# Patient Record
Sex: Male | Born: 1955 | Hispanic: Yes | Marital: Married | Smoking: Former smoker
Health system: Southern US, Community
[De-identification: ages and names within clinical notes are randomized; demographics above are authoritative.]

## PROBLEM LIST (undated history)

## (undated) DIAGNOSIS — E1143 Type 2 diabetes mellitus with diabetic autonomic (poly)neuropathy: Secondary | ICD-10-CM

## (undated) DIAGNOSIS — K3184 Gastroparesis: Secondary | ICD-10-CM

## (undated) DIAGNOSIS — R079 Chest pain, unspecified: Secondary | ICD-10-CM

## (undated) DIAGNOSIS — I219 Acute myocardial infarction, unspecified: Secondary | ICD-10-CM

## (undated) DIAGNOSIS — I10 Essential (primary) hypertension: Secondary | ICD-10-CM

## (undated) DIAGNOSIS — E119 Type 2 diabetes mellitus without complications: Secondary | ICD-10-CM

## (undated) DIAGNOSIS — H4901 Third [oculomotor] nerve palsy, right eye: Secondary | ICD-10-CM

## (undated) DIAGNOSIS — I714 Abdominal aortic aneurysm, without rupture, unspecified: Secondary | ICD-10-CM

## (undated) DIAGNOSIS — N189 Chronic kidney disease, unspecified: Secondary | ICD-10-CM

## (undated) DIAGNOSIS — R809 Proteinuria, unspecified: Secondary | ICD-10-CM

## (undated) DIAGNOSIS — E1165 Type 2 diabetes mellitus with hyperglycemia: Secondary | ICD-10-CM

## (undated) HISTORY — PX: NECK SURGERY: SHX720

## (undated) HISTORY — DX: Type 2 diabetes mellitus with hyperglycemia: E11.65

## (undated) HISTORY — DX: Abdominal aortic aneurysm, without rupture, unspecified: I71.40

## (undated) HISTORY — DX: Abdominal aortic aneurysm, without rupture: I71.4

## (undated) HISTORY — PX: COLONOSCOPY: SHX174

## (undated) HISTORY — PX: EYE SURGERY: SHX253

## (undated) HISTORY — DX: Third (oculomotor) nerve palsy, right eye: H49.01

## (undated) HISTORY — DX: Chronic kidney disease, unspecified: N18.9

## (undated) HISTORY — DX: Chest pain, unspecified: R07.9

## (undated) HISTORY — DX: Gastroparesis: K31.84

## (undated) HISTORY — DX: Acute myocardial infarction, unspecified: I21.9

## (undated) HISTORY — DX: Proteinuria, unspecified: R80.9

## (undated) HISTORY — DX: Type 2 diabetes mellitus with diabetic autonomic (poly)neuropathy: E11.43

---
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
## 2000-10-19 ENCOUNTER — Encounter: Payer: Self-pay | Admitting: Emergency Medicine

## 2000-10-19 ENCOUNTER — Emergency Department (HOSPITAL_COMMUNITY): Admission: EM | Admit: 2000-10-19 | Discharge: 2000-10-19 | Payer: Self-pay | Admitting: Emergency Medicine

## 2002-03-01 ENCOUNTER — Emergency Department (HOSPITAL_COMMUNITY): Admission: EM | Admit: 2002-03-01 | Discharge: 2002-03-02 | Payer: Self-pay | Admitting: Emergency Medicine

## 2006-07-06 ENCOUNTER — Emergency Department (HOSPITAL_COMMUNITY): Admission: EM | Admit: 2006-07-06 | Discharge: 2006-07-06 | Payer: Self-pay | Admitting: Emergency Medicine

## 2009-01-10 ENCOUNTER — Emergency Department (HOSPITAL_COMMUNITY): Admission: EM | Admit: 2009-01-10 | Discharge: 2009-01-11 | Payer: Self-pay | Admitting: Emergency Medicine

## 2010-03-29 ENCOUNTER — Emergency Department (HOSPITAL_COMMUNITY): Admission: EM | Admit: 2010-03-29 | Discharge: 2010-03-30 | Payer: Self-pay | Admitting: Emergency Medicine

## 2010-10-25 LAB — URINE CULTURE
Colony Count: NO GROWTH
Culture  Setup Time: 201108190210
Culture: NO GROWTH

## 2010-10-25 LAB — URINALYSIS, ROUTINE W REFLEX MICROSCOPIC
Bilirubin Urine: NEGATIVE
Glucose, UA: >1000 mg/dL — AB
Hgb urine dipstick: NEGATIVE
Ketones, ur: NEGATIVE mg/dL
Leukocytes, UA: NEGATIVE
Nitrite: NEGATIVE
Protein, ur: 100 mg/dL — AB
Specific Gravity, Urine: 1.031 — ABNORMAL HIGH (ref 1.005–1.030)
Urobilinogen, UA: 1 mg/dL (ref 0.0–1.0)
pH: 5.5 (ref 5.0–8.0)

## 2010-10-25 LAB — GLUCOSE, CAPILLARY
Glucose-Capillary: 103 mg/dL — ABNORMAL HIGH (ref 70–99)
Glucose-Capillary: 107 mg/dL — ABNORMAL HIGH (ref 70–99)
Glucose-Capillary: 245 mg/dL — ABNORMAL HIGH (ref 70–99)
Glucose-Capillary: 76 mg/dL (ref 70–99)
Glucose-Capillary: 97 mg/dL (ref 70–99)

## 2010-10-25 LAB — DIFFERENTIAL
Basophils Absolute: 0 10*3/uL (ref 0.0–0.1)
Basophils Relative: 1 % (ref 0–1)
Eosinophils Absolute: 0.1 10*3/uL (ref 0.0–0.7)
Eosinophils Relative: 1 % (ref 0–5)
Lymphocytes Relative: 43 % (ref 12–46)
Lymphs Abs: 3.4 10*3/uL (ref 0.7–4.0)
Monocytes Absolute: 0.6 10*3/uL (ref 0.1–1.0)
Monocytes Relative: 7 % (ref 3–12)
Neutro Abs: 3.8 10*3/uL (ref 1.7–7.7)
Neutrophils Relative %: 49 % (ref 43–77)

## 2010-10-25 LAB — POCT I-STAT, CHEM 8
BUN: 16 mg/dL (ref 6–23)
Calcium, Ion: 1.2 mmol/L (ref 1.12–1.32)
Chloride: 103 mEq/L (ref 96–112)
Creatinine, Ser: 0.7 mg/dL (ref 0.4–1.5)
Glucose, Bld: 134 mg/dL — ABNORMAL HIGH (ref 70–99)
HCT: 41 % (ref 39.0–52.0)
Hemoglobin: 13.9 g/dL (ref 13.0–17.0)
Potassium: 3.2 mEq/L — ABNORMAL LOW (ref 3.5–5.1)
Sodium: 139 mEq/L (ref 135–145)
TCO2: 25 mmol/L (ref 0–100)

## 2010-10-25 LAB — CBC
HCT: 40.2 % (ref 39.0–52.0)
Hemoglobin: 14.3 g/dL (ref 13.0–17.0)
MCH: 31.9 pg (ref 26.0–34.0)
MCHC: 35.6 g/dL (ref 30.0–36.0)
MCV: 89.5 fL (ref 78.0–100.0)
Platelets: 160 10*3/uL (ref 150–400)
RBC: 4.49 MIL/uL (ref 4.22–5.81)
RDW: 12.1 % (ref 11.5–15.5)
WBC: 7.9 10*3/uL (ref 4.0–10.5)

## 2010-10-25 LAB — URINE MICROSCOPIC-ADD ON

## 2010-11-19 LAB — POCT CARDIAC MARKERS
CKMB, poc: 1.1 ng/mL (ref 1.0–8.0)
CKMB, poc: <1 ng/mL — ABNORMAL LOW (ref 1.0–8.0)
Myoglobin, poc: 62.3 ng/mL (ref 12–200)
Myoglobin, poc: 81.6 ng/mL (ref 12–200)
Troponin i, poc: <0.05 ng/mL (ref 0.00–0.09)
Troponin i, poc: <0.05 ng/mL (ref 0.00–0.09)

## 2010-11-19 LAB — POCT I-STAT, CHEM 8
BUN: 15 mg/dL (ref 6–23)
Calcium, Ion: 1.13 mmol/L (ref 1.12–1.32)
Chloride: 105 mEq/L (ref 96–112)
Creatinine, Ser: 0.9 mg/dL (ref 0.4–1.5)
Glucose, Bld: 249 mg/dL — ABNORMAL HIGH (ref 70–99)
HCT: 43 % (ref 39.0–52.0)
Hemoglobin: 14.6 g/dL (ref 13.0–17.0)
Potassium: 4.2 mEq/L (ref 3.5–5.1)
Sodium: 139 mEq/L (ref 135–145)
TCO2: 26 mmol/L (ref 0–100)

## 2010-11-19 LAB — DIFFERENTIAL
Basophils Absolute: 0 10*3/uL (ref 0.0–0.1)
Basophils Relative: 1 % (ref 0–1)
Eosinophils Absolute: 0.2 10*3/uL (ref 0.0–0.7)
Eosinophils Relative: 2 % (ref 0–5)
Lymphocytes Relative: 34 % (ref 12–46)
Lymphs Abs: 2.5 10*3/uL (ref 0.7–4.0)
Monocytes Absolute: 0.6 10*3/uL (ref 0.1–1.0)
Monocytes Relative: 8 % (ref 3–12)
Neutro Abs: 4 10*3/uL (ref 1.7–7.7)
Neutrophils Relative %: 55 % (ref 43–77)

## 2010-11-19 LAB — CBC
HCT: 42.5 % (ref 39.0–52.0)
Hemoglobin: 14.4 g/dL (ref 13.0–17.0)
MCHC: 33.9 g/dL (ref 30.0–36.0)
MCV: 89.3 fL (ref 78.0–100.0)
Platelets: 159 10*3/uL (ref 150–400)
RBC: 4.76 MIL/uL (ref 4.22–5.81)
RDW: 12.4 % (ref 11.5–15.5)
WBC: 7.2 10*3/uL (ref 4.0–10.5)

## 2010-11-19 LAB — GLUCOSE, CAPILLARY: Glucose-Capillary: 225 mg/dL — ABNORMAL HIGH (ref 70–99)

## 2010-11-19 LAB — D-DIMER, QUANTITATIVE: D-Dimer, Quant: <0.22 ug/mL-FEU (ref 0.00–0.48)

## 2011-08-05 ENCOUNTER — Ambulatory Visit: Payer: Managed Care, Other (non HMO)

## 2011-08-05 DIAGNOSIS — E860 Dehydration: Secondary | ICD-10-CM

## 2011-08-05 DIAGNOSIS — E1065 Type 1 diabetes mellitus with hyperglycemia: Secondary | ICD-10-CM

## 2011-08-09 ENCOUNTER — Ambulatory Visit (INDEPENDENT_AMBULATORY_CARE_PROVIDER_SITE_OTHER): Payer: Managed Care, Other (non HMO)

## 2011-08-09 DIAGNOSIS — E119 Type 2 diabetes mellitus without complications: Secondary | ICD-10-CM

## 2011-11-25 ENCOUNTER — Ambulatory Visit: Payer: Managed Care, Other (non HMO) | Admitting: Internal Medicine

## 2011-11-25 VITALS — BP 133/82 | HR 70 | Temp 98.0°F | Resp 18 | Ht 65.0 in | Wt 161.0 lb

## 2011-11-25 DIAGNOSIS — IMO0002 Reserved for concepts with insufficient information to code with codable children: Secondary | ICD-10-CM

## 2011-11-25 DIAGNOSIS — E1165 Type 2 diabetes mellitus with hyperglycemia: Secondary | ICD-10-CM | POA: Insufficient documentation

## 2011-11-25 DIAGNOSIS — IMO0001 Reserved for inherently not codable concepts without codable children: Secondary | ICD-10-CM

## 2011-11-25 HISTORY — DX: Reserved for concepts with insufficient information to code with codable children: IMO0002

## 2011-11-25 HISTORY — DX: Type 2 diabetes mellitus with hyperglycemia: E11.65

## 2011-11-25 LAB — POCT URINALYSIS DIPSTICK
Bilirubin, UA: NEGATIVE
Blood, UA: NEGATIVE
Glucose, UA: 1000
Ketones, UA: NEGATIVE
Leukocytes, UA: NEGATIVE
Nitrite, UA: NEGATIVE
Protein, UA: 100
Spec Grav, UA: 1.01
Urobilinogen, UA: 0.2
pH, UA: 6.5

## 2011-11-25 LAB — POCT CBC
Granulocyte percent: 62.5 %G (ref 37–80)
HCT, POC: 44.8 % (ref 43.5–53.7)
Hemoglobin: 15.1 g/dL (ref 14.1–18.1)
Lymph, poc: 2.1 (ref 0.6–3.4)
MCH, POC: 29.6 pg (ref 27–31.2)
MCHC: 33.7 g/dL (ref 31.8–35.4)
MCV: 87.9 fL (ref 80–97)
MID (cbc): 0.4 (ref 0–0.9)
MPV: 13.7 fL (ref 0–99.8)
POC Granulocyte: 4.2 (ref 2–6.9)
POC LYMPH PERCENT: 31.6 %L (ref 10–50)
POC MID %: 5.9 %M (ref 0–12)
Platelet Count, POC: 163 10*3/uL (ref 142–424)
RBC: 5.1 M/uL (ref 4.69–6.13)
RDW, POC: 12.8 %
WBC: 6.7 10*3/uL (ref 4.6–10.2)

## 2011-11-25 LAB — POCT UA - MICROSCOPIC ONLY
Bacteria, U Microscopic: NEGATIVE
Casts, Ur, LPF, POC: NEGATIVE
Crystals, Ur, HPF, POC: NEGATIVE
Epithelial cells, urine per micros: NEGATIVE
RBC, urine, microscopic: NEGATIVE
Yeast, UA: NEGATIVE

## 2011-11-25 LAB — POCT GLYCOSYLATED HEMOGLOBIN (HGB A1C): Hemoglobin A1C: 13.4

## 2011-11-25 LAB — GLUCOSE, POCT (MANUAL RESULT ENTRY)

## 2011-11-25 MED ORDER — INSULIN GLARGINE 100 UNIT/ML ~~LOC~~ SOLN: 10.0000 [IU] | Freq: Every day | SUBCUTANEOUS | Status: DC

## 2011-11-25 MED ORDER — INSULIN NPH (HUMAN) (ISOPHANE) 100 UNIT/ML ~~LOC~~ SUSP
10.0000 [IU] | Freq: Once | SUBCUTANEOUS | Status: AC
  Administered 2011-11-25: 10 [IU] via SUBCUTANEOUS

## 2011-11-25 NOTE — Patient Instructions (Signed)
TAKE 10 UNITS OF LANTUS INSULIN AT BEDTIME DAILY.  CHECK YOUR BLOOD SUGAR 3 TIMES A WEEK AND RECORD, BRING WITH YOU TO THE NEXT OFFICE VISIT.  Diabetes tipo 2 (Diabetes, Type 2) La diabetes es una enfermedad crnica. En la diabetes tipo 2, el pncreas no fabrica la cantidad suficiente de insulina (una hormona) y el organismo no responde normalmente a la insulina que produce. Este tipo de diabetes antes se llamaba diabetes del [place]. Generalmente aparece despus de los 43 aos, pero puede suceder a Hotel manager. CAUSAS La diabetes tipo 2 aparece cuando el pncreas no produce la cantidad suficiente de insulina o su organismo tiene dificultad para usar la insulina que el pncreas produce adecuadamente.  SNTOMAS  Beber ms que lo habitual.   Orinar ms que lo habitual.   Visin borrosa   Piel seca y que pica.   Infecciones frecuentes.   Sentir ms cansancio que lo habitual (fatiga).  DIAGNSTICO  El diagnstico de diabetes tipo 2 se hace a travs de las siguientes pruebas:   Prueba de glucosa en sangre en ayunas. Usted no debe comer durante al menos 8 horas y [place] se hace el anlisis de [place].   Pruebas al azar de glucosa en sangre. El nivel de glucosa en sangre (azcar)se controla en cualquier momento del da sin importar el momento en que haya comido.   Prueba oral de tolerancia a la glucosa. La glucosa en sangre se mide despus de no haber comido ayunado) y despus de haber bebido una preparacin que contenga glucosa.  TRATAMIENTO  Consuma una dieta saludable.   La prctica de ejercicios.   Si es necesario, Biochemist, clinical.   Controlar el nivel de glucosa en sangre (azcar).   Concurra regularmente a la consulta con el [facility] su nivel de glucosa en sangre (azcar) al menos una vez al da. Puede ser necesario que realice controles ms frecuentes, segn los medicamentos que toma y el xito en el  control de la diabetes. El profesional lo ayudar.   Tome la [employer] le ha indicado el profesional que lo asiste.   No fume.   Elija cuidadosamente los alimentos. Pida informacin a su mdico. La prdida de peso puede mejorar la diabetes.   Investigue acerca del nivel bajo de azcar en sangre (hipoglucemia) y aprenda cmo tratarlo.   Hgase un examen de la vista con regularidad.   Concurra para un examen fsico una vez por ao. Controle su presin arterial. Haga anlisis de sangre y [place].   Use un colgante o una pulsera que indique que es diabtico.   Controle sus pies todas las noches para observar si hay cortes, llagas, ampollas o enrojecimiento. Hable con el profesional que lo asiste si tiene algn problema.  SOLICITE ATENCIN MDICA SI:  Tiene problemas para Advertising account executive de glucosa en el rango indicado.   Siente efectos adversos por los medicamentos prescriptos.   Tiene sntomas de enfermedad que no mejoran en 24 horas.   Tiene una llaga o herida que no se cura.   Nota cambios o un nuevo problema en la visin.   Tiene fiebre.  ASEGRESE DE QUE:   Comprende estas instrucciones.   Controlar su enfermedad.   Solicitar ayuda de inmediato si no mejora o si empeora.  Document Released: 07/29/2005 Document Revised: 07/18/2011 [facility] Patient Information 2012 [place].

## 2011-11-25 NOTE — Progress Notes (Signed)
  Subjective:    Patient ID: Ryan Wells, male    DOB: 06-17-1956, 56 y.o.   MRN: [id number]  HPI  Ruairi is here with an interpreter, he speaks no [place].  He works 6 pm-6am.  He stopped his Metformin due to vomiting and does not wish to restart this.  He is currently out of his humalog but was only taking it once a day.  He was recently hit for a piece of machinery at work and hurt his right side, he was on pain medicine for awhile.  His interpreter tells me he was on Lantus before that he got from a doctor in [facility].  History is very difficult to ascertain as patient does not understand or answer questions even when interpreter speaks with him.    Review of Systems  All other systems reviewed and are negative.  He denies any chest pain, SOB, he denies any parathesias.     Objective:   Physical Exam  Vitals reviewed. Constitutional: He is oriented to person, place, and time. He appears well-developed and well-nourished.  HENT:  Head: Normocephalic and atraumatic.  Right Ear: External ear normal.  Eyes: Conjunctivae are normal.  Neck: Neck supple.  Cardiovascular: Normal rate, regular rhythm and normal heart sounds.   Pulmonary/Chest: Effort normal and breath sounds normal.  Abdominal: Soft.  Musculoskeletal: He exhibits no edema and no tenderness.  Neurological: He is alert and oriented to person, place, and time.  Skin: Skin is warm and dry.  Psychiatric: He has a normal mood and affect. His behavior is normal.          Assessment & Plan:  Glucose [facility] at 8 pm tonight.  Given 10U of Humalog SQ.  CMP, Lipids pending.  He is given a copy of his last lab work to emphasize the need for better sugar control.  Recheck 2-3 weeks.  Start Lantus 10U at bedtime daily.  Will hold metformin and rapid acting insulin.  Record blood sugars 3X week and bring with him next OV.  Pt agrees, AVS printed and given.

## 2011-11-26 LAB — COMPREHENSIVE METABOLIC PANEL
ALT: 25 U/L (ref 0–53)
AST: 20 U/L (ref 0–37)
Albumin: 4.2 g/dL (ref 3.5–5.2)
Alkaline Phosphatase: 174 U/L — ABNORMAL HIGH (ref 39–117)
BUN: 19 mg/dL (ref 6–23)
CO2: 26 mEq/L (ref 19–32)
Calcium: 9.1 mg/dL (ref 8.4–10.5)
Chloride: 100 mEq/L (ref 96–112)
Creat: 1.09 mg/dL (ref 0.50–1.35)
Glucose, Bld: 472 mg/dL — ABNORMAL HIGH (ref 70–99)
Potassium: 4.3 mEq/L (ref 3.5–5.3)
Sodium: 137 mEq/L (ref 135–145)
Total Bilirubin: 0.6 mg/dL (ref 0.3–1.2)
Total Protein: 7.1 g/dL (ref 6.0–8.3)

## 2011-11-26 LAB — LIPID PANEL
Cholesterol: 201 mg/dL — ABNORMAL HIGH (ref 0–200)
HDL: 35 mg/dL — ABNORMAL LOW (ref >39)
LDL Cholesterol: 105 mg/dL — ABNORMAL HIGH (ref 0–99)
Total CHOL/HDL Ratio: 5.7 Ratio
Triglycerides: 305 mg/dL — ABNORMAL HIGH (ref <150)
VLDL: 61 mg/dL — ABNORMAL HIGH (ref 0–40)

## 2012-03-26 ENCOUNTER — Telehealth: Payer: Self-pay

## 2012-03-26 NOTE — Telephone Encounter (Signed)
[facility], BUT THEY NEED THE PEN NEEDLES TO [place] WITH IT PLEASE CALL [place] AT [phone]

## 2012-07-30 ENCOUNTER — Ambulatory Visit: Payer: Self-pay

## 2012-07-30 ENCOUNTER — Other Ambulatory Visit: Payer: Self-pay | Admitting: Occupational Medicine

## 2012-07-30 DIAGNOSIS — M549 Dorsalgia, unspecified: Secondary | ICD-10-CM

## 2012-07-30 DIAGNOSIS — M542 Cervicalgia: Secondary | ICD-10-CM

## 2012-07-30 IMAGING — CR DG THORACIC SPINE 2V
2 series · 2 of 2 positions shown · non-contrast
Comparison: None

CLINICAL DATA: Back pain.

THORACIC SPINE - 2 VIEW

[view not recorded (1 of 2)]
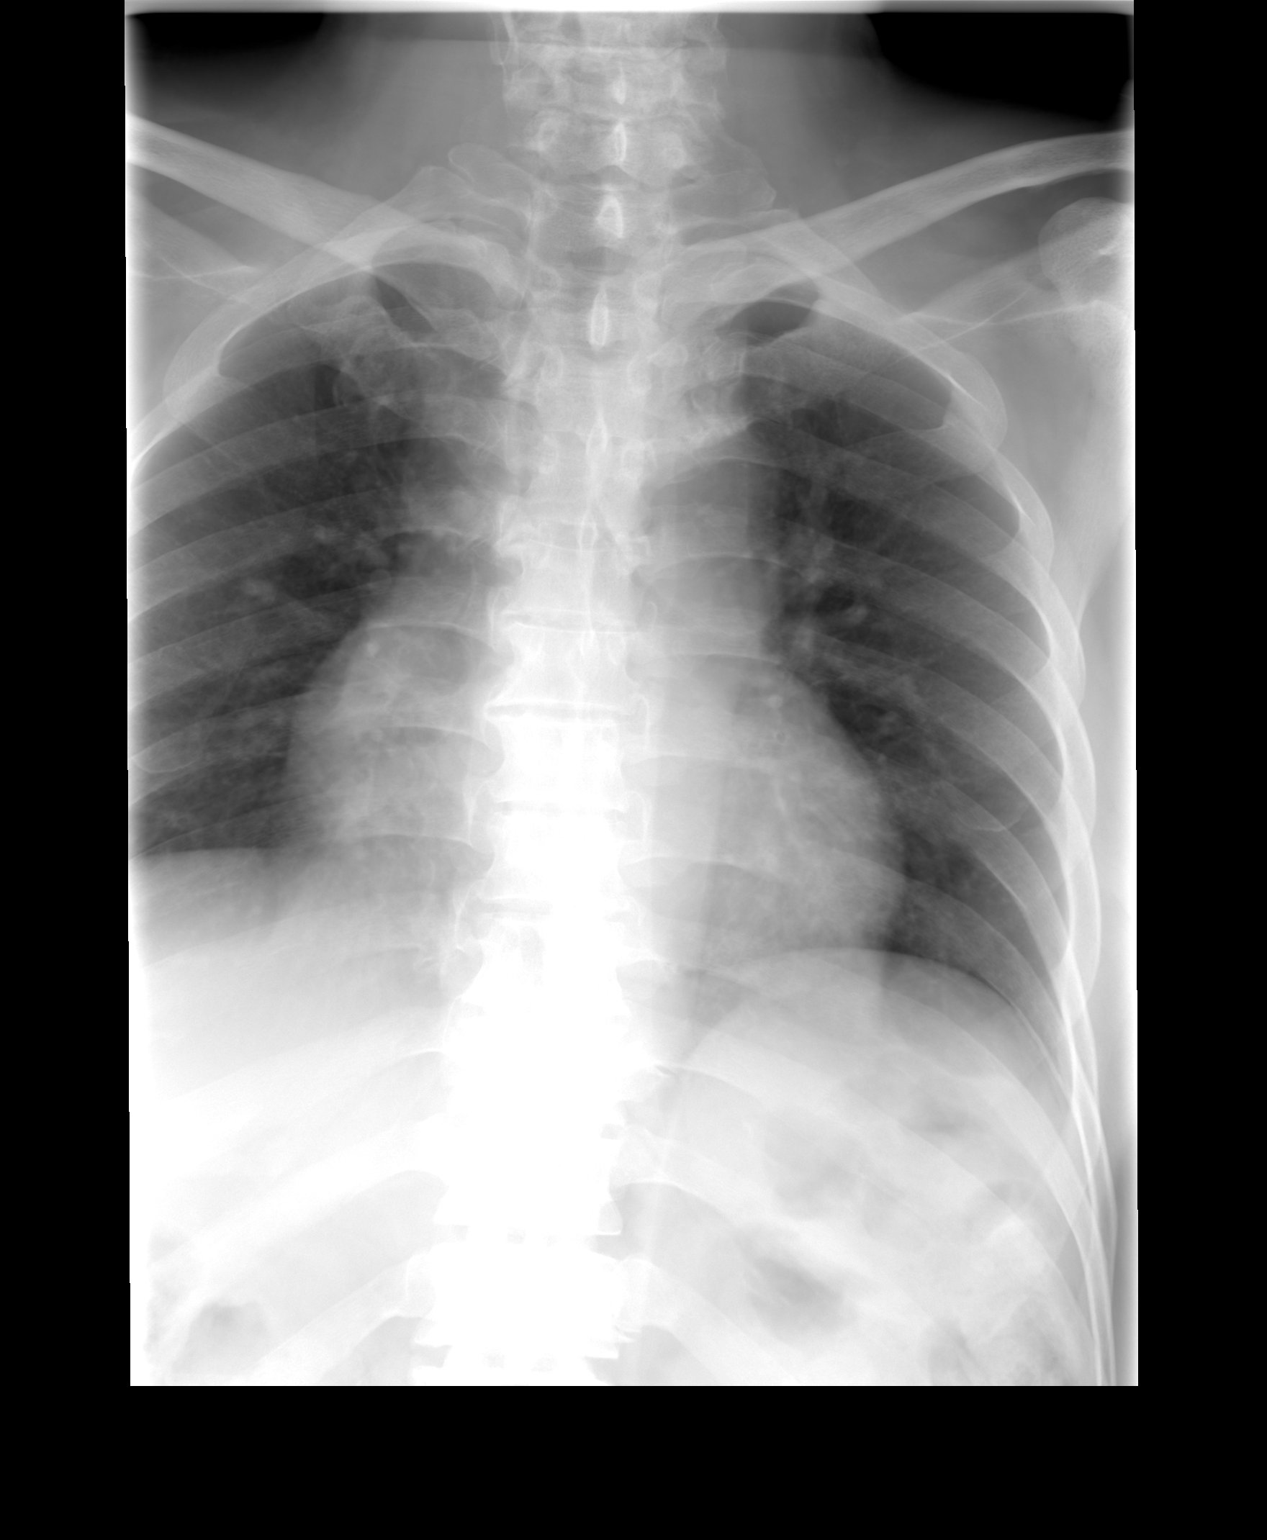

[view not recorded (2 of 2)]
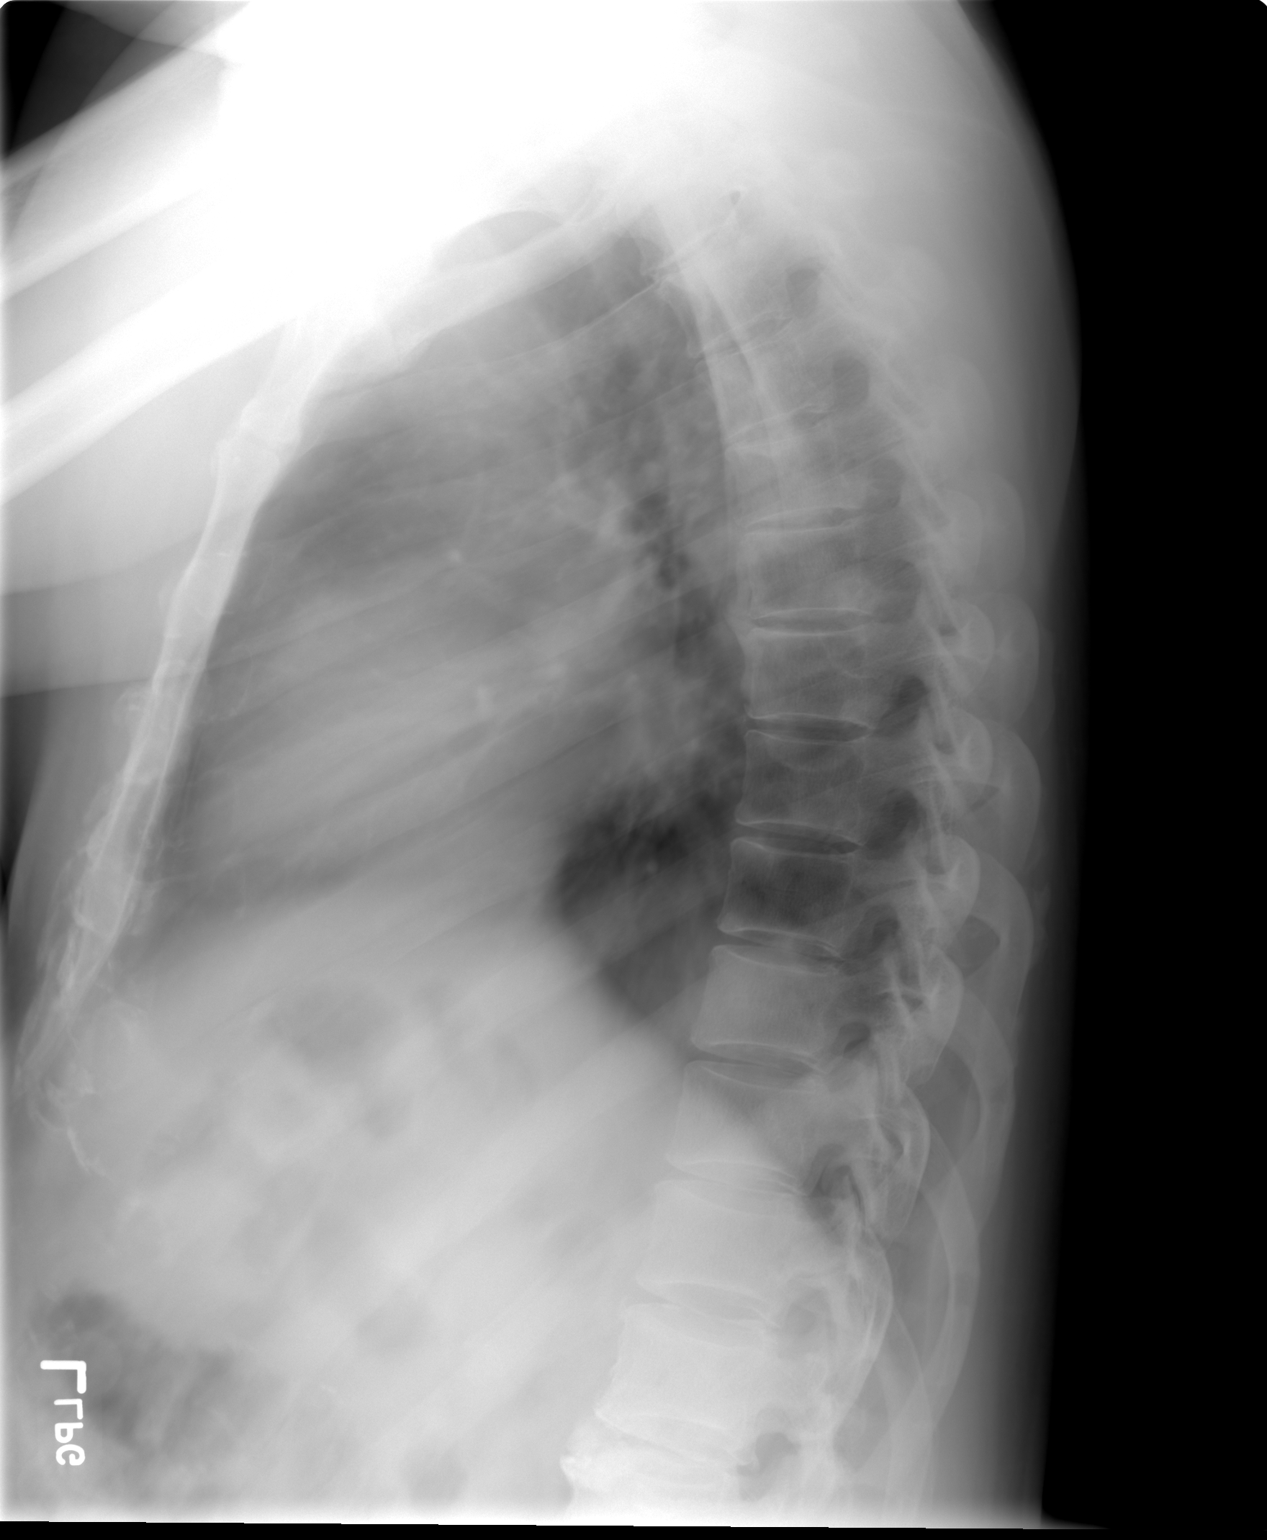

[2 of 2 positions shown; findings below may reference images not displayed]

FINDINGS: The lateral film demonstrates normal alignment of the
thoracic vertebral bodies.  Disc spaces and vertebral bodies are
maintained.  No acute bony findings, destructive bony changes or
abnormal paraspinal soft tissue swelling.  The visualized posterior
ribs appear normal.
IMPRESSION: Normal alignment and no acute bony findings.

## 2012-07-30 IMAGING — CR DG LUMBAR SPINE COMPLETE 4+V
5 series · 5 of 5 positions shown · non-contrast
Comparison: None

CLINICAL DATA: Back pain.  Recent injury.

LUMBAR SPINE - COMPLETE 4+ VIEW

[view not recorded (1 of 5)]
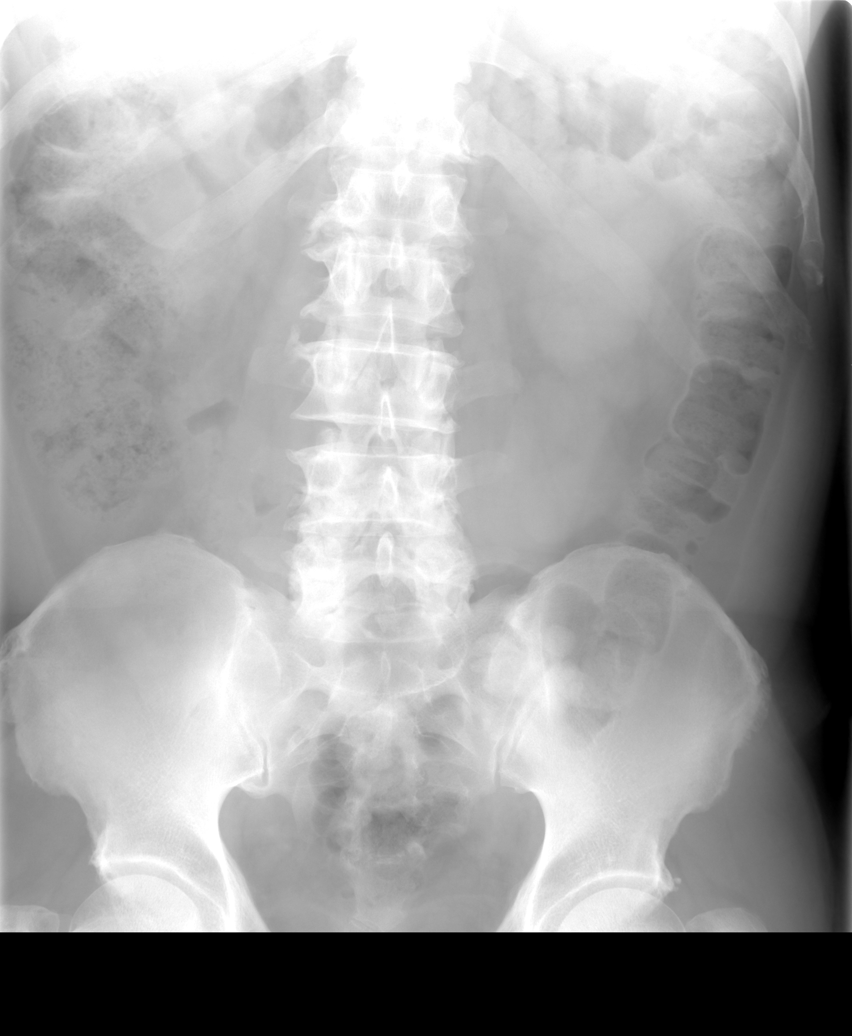

[view not recorded (2 of 5)]
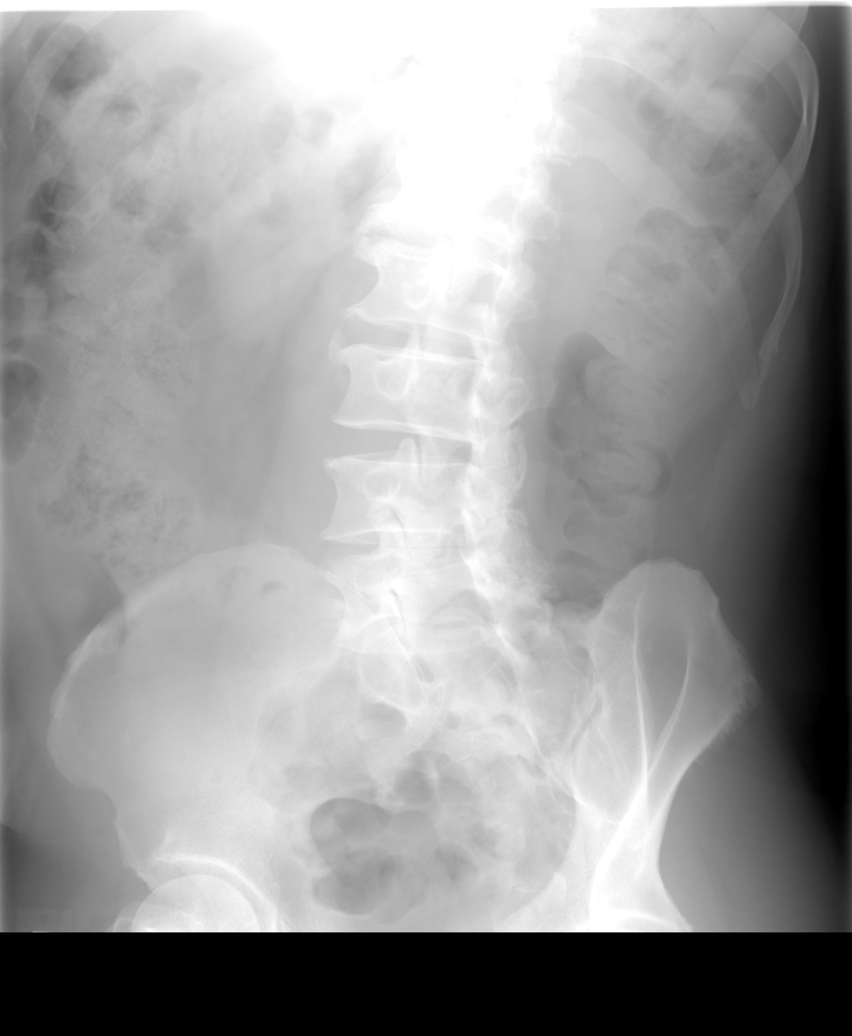

[view not recorded (3 of 5)]
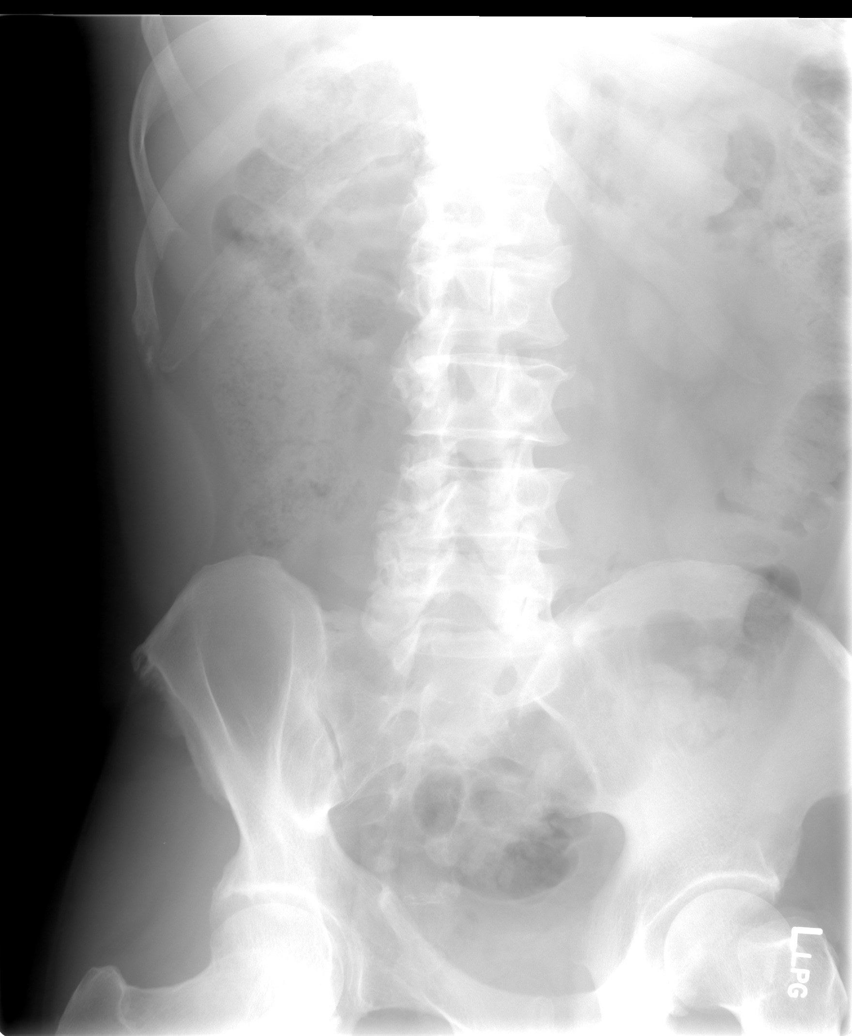

[view not recorded (4 of 5)]
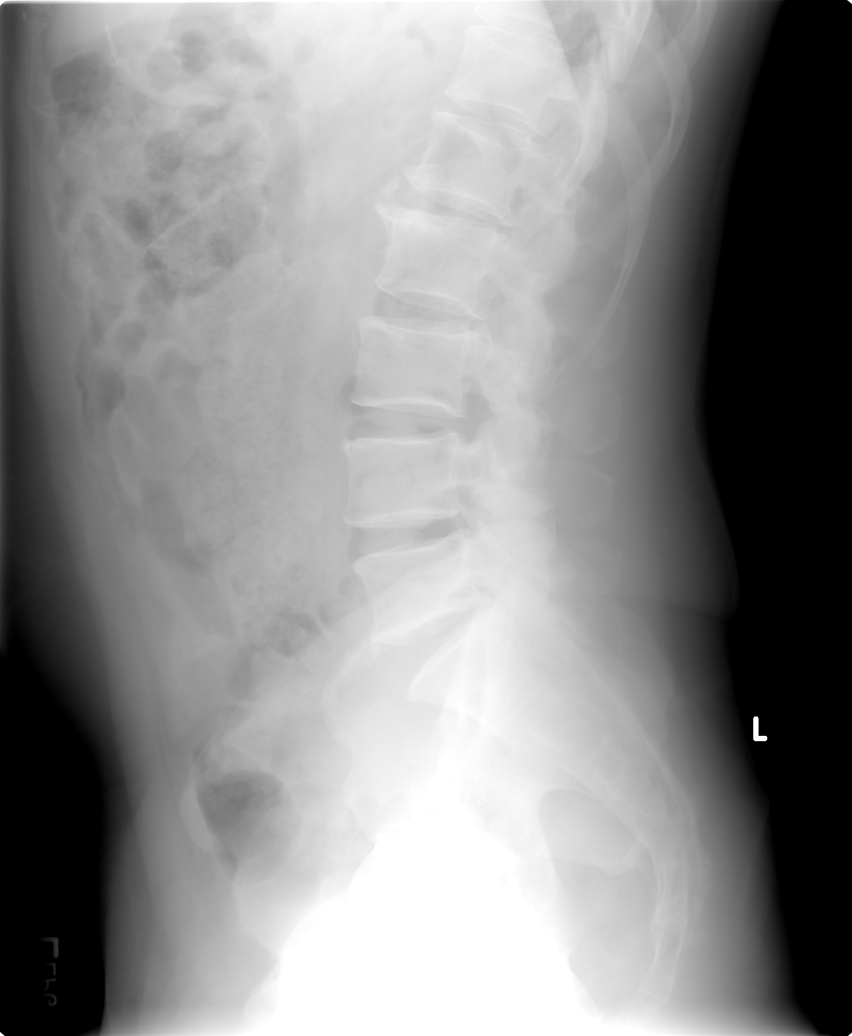

[view not recorded (5 of 5)]
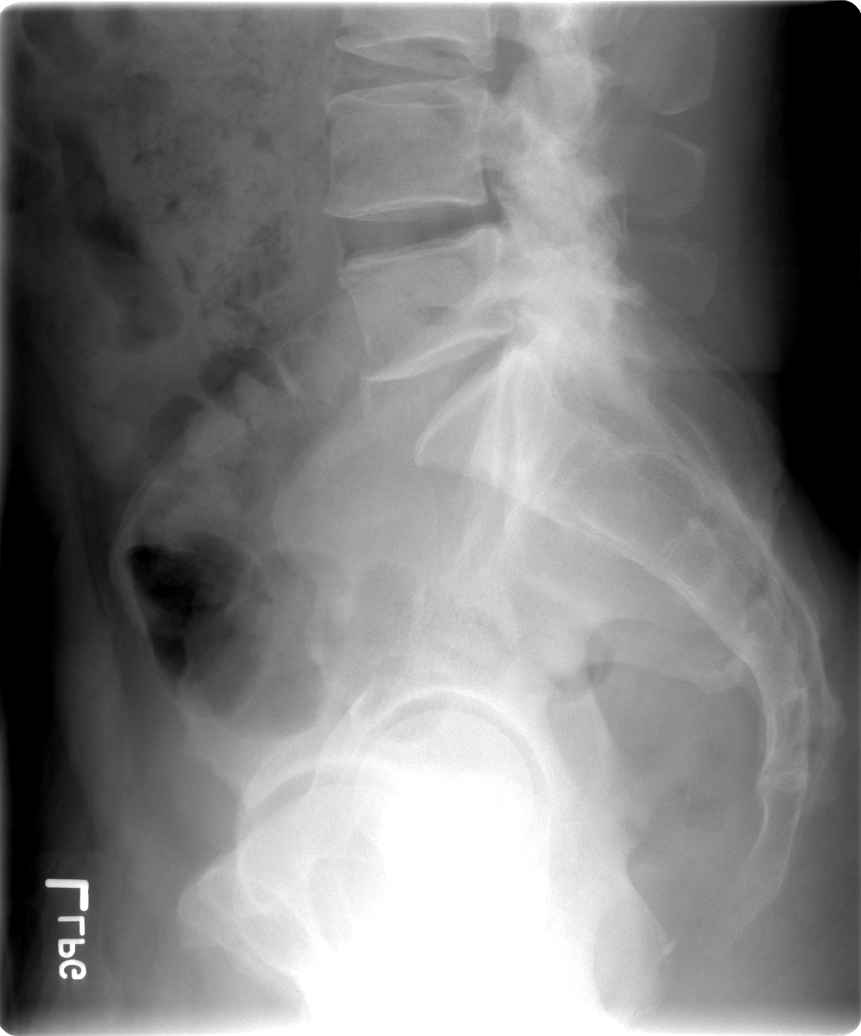

[5 of 5 positions shown; findings below may reference images not displayed]

FINDINGS: Negative for fracture.  Normal lumbar alignment.
Negative for pars defect.  Mild lumbar disc degeneration at
multiple levels with mild disc space narrowing.
IMPRESSION: Mild disc degeneration.  Negative for fracture.

## 2012-07-30 IMAGING — CR DG CERVICAL SPINE COMPLETE 4+V
6 series · 6 of 6 positions shown · non-contrast
Comparison: None

CLINICAL DATA: Neck pain.

CERVICAL SPINE - COMPLETE 4+ VIEW

[view not recorded (1 of 6)]
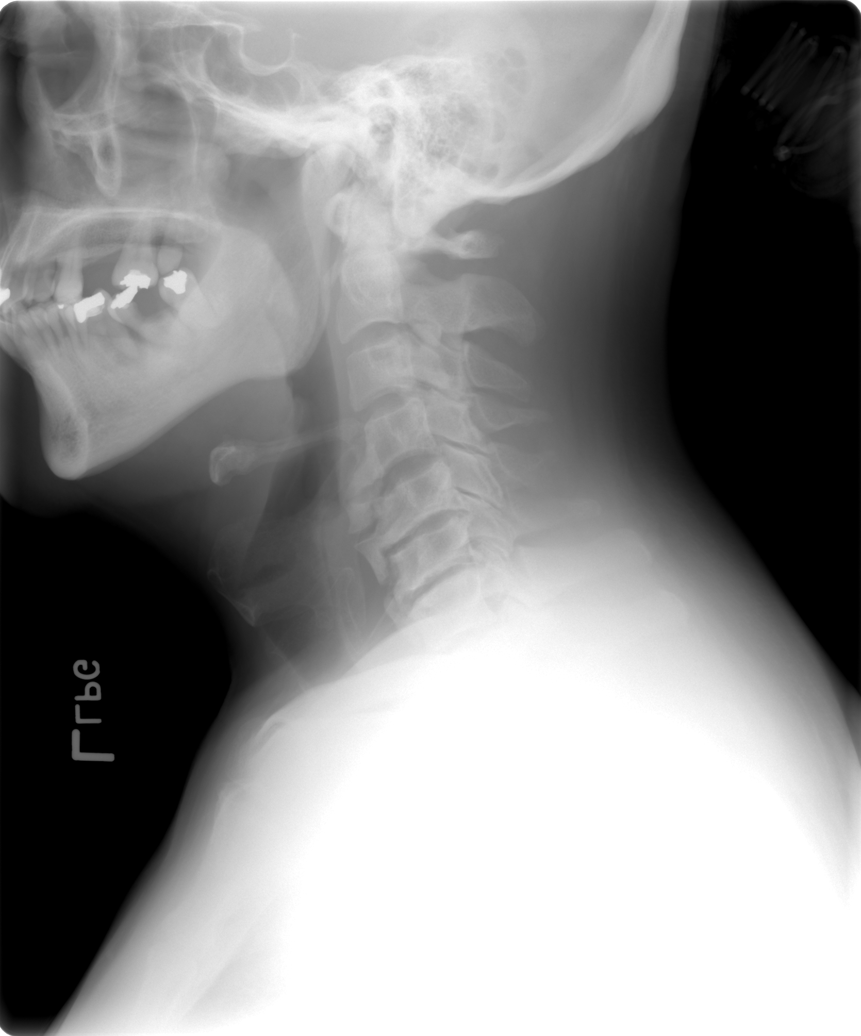

[view not recorded (2 of 6)]
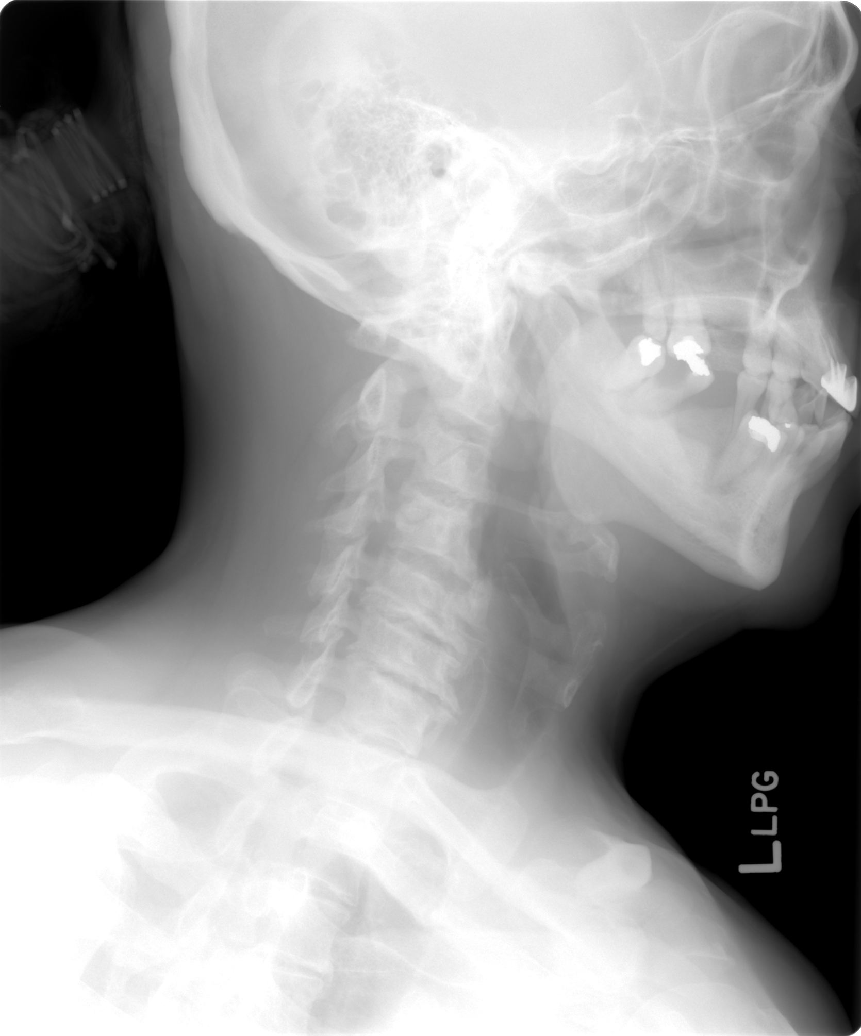

[view not recorded (3 of 6)]
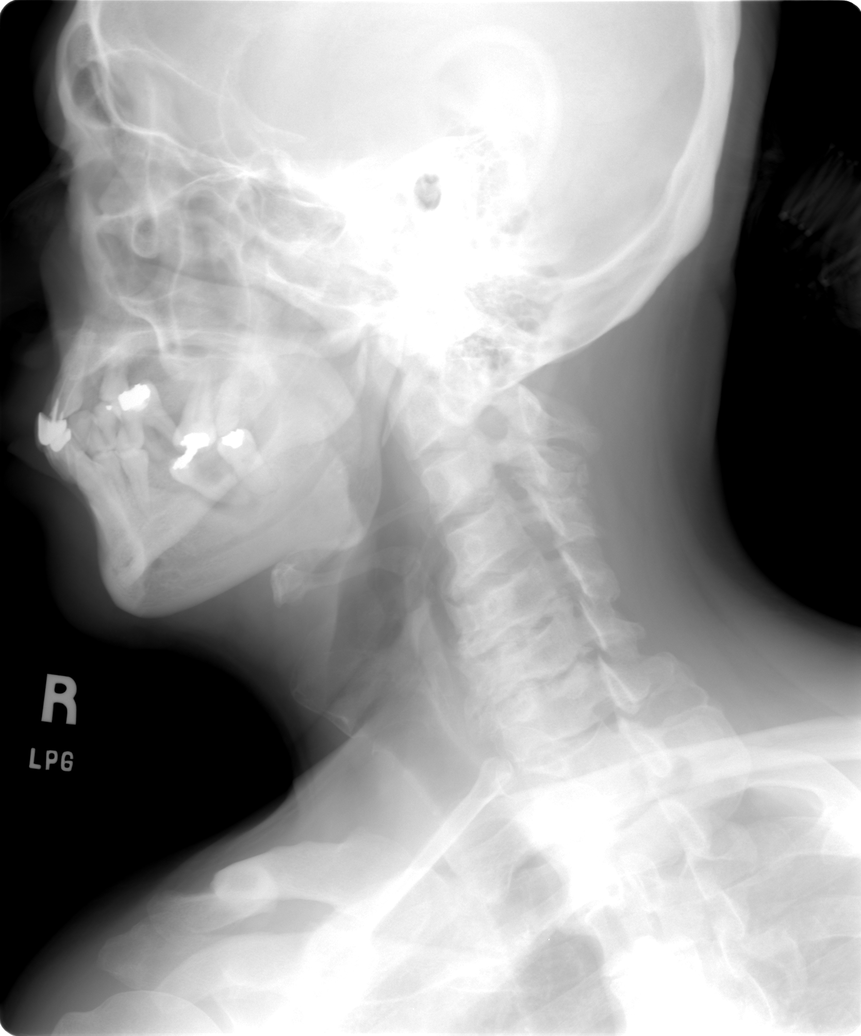

[view not recorded (4 of 6)]
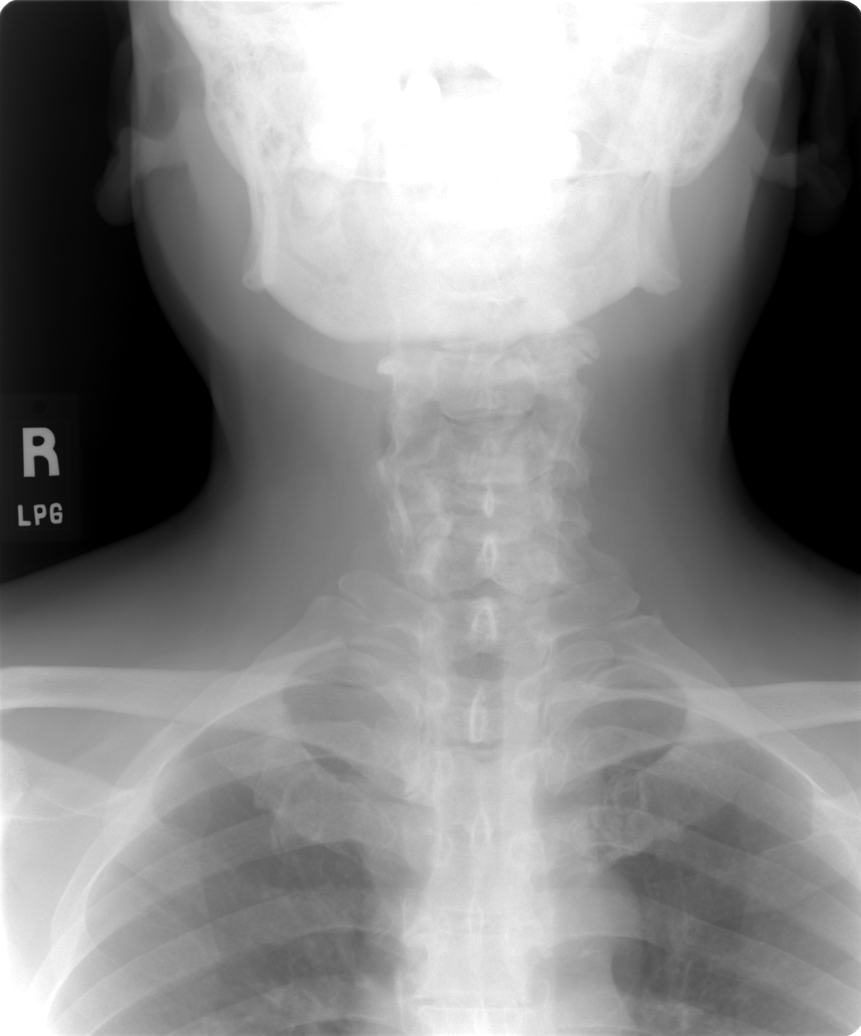

[view not recorded (5 of 6)]
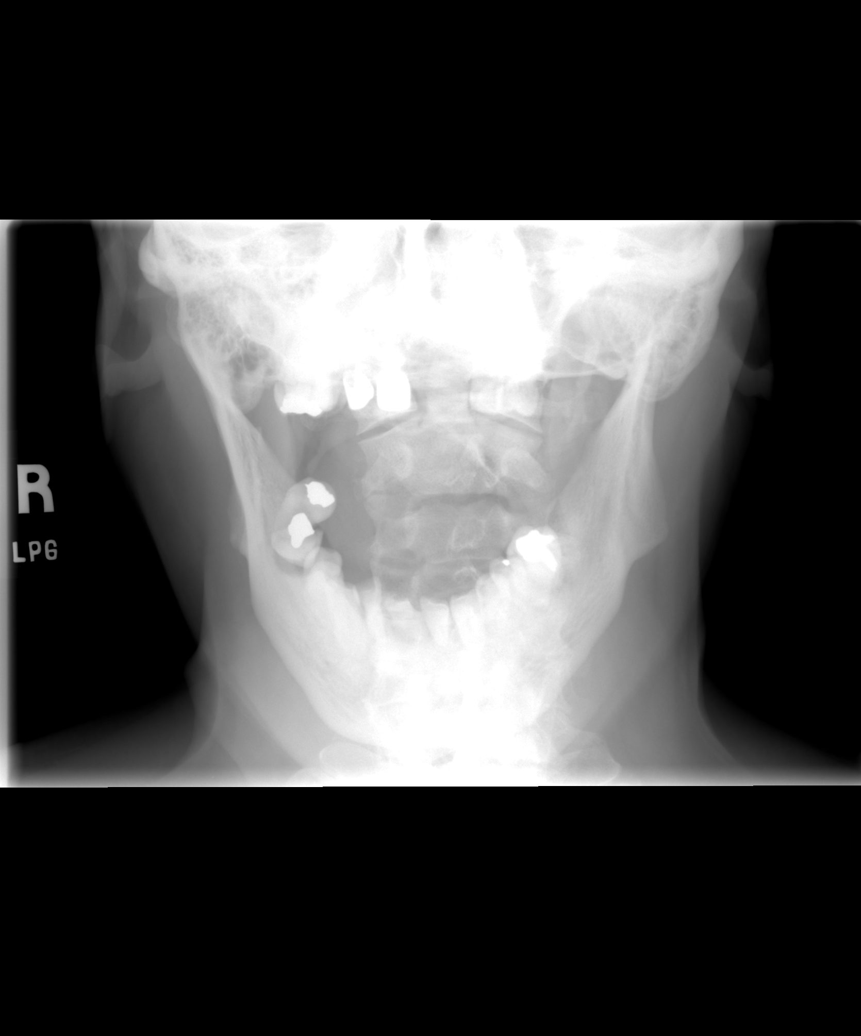

[view not recorded (6 of 6)]
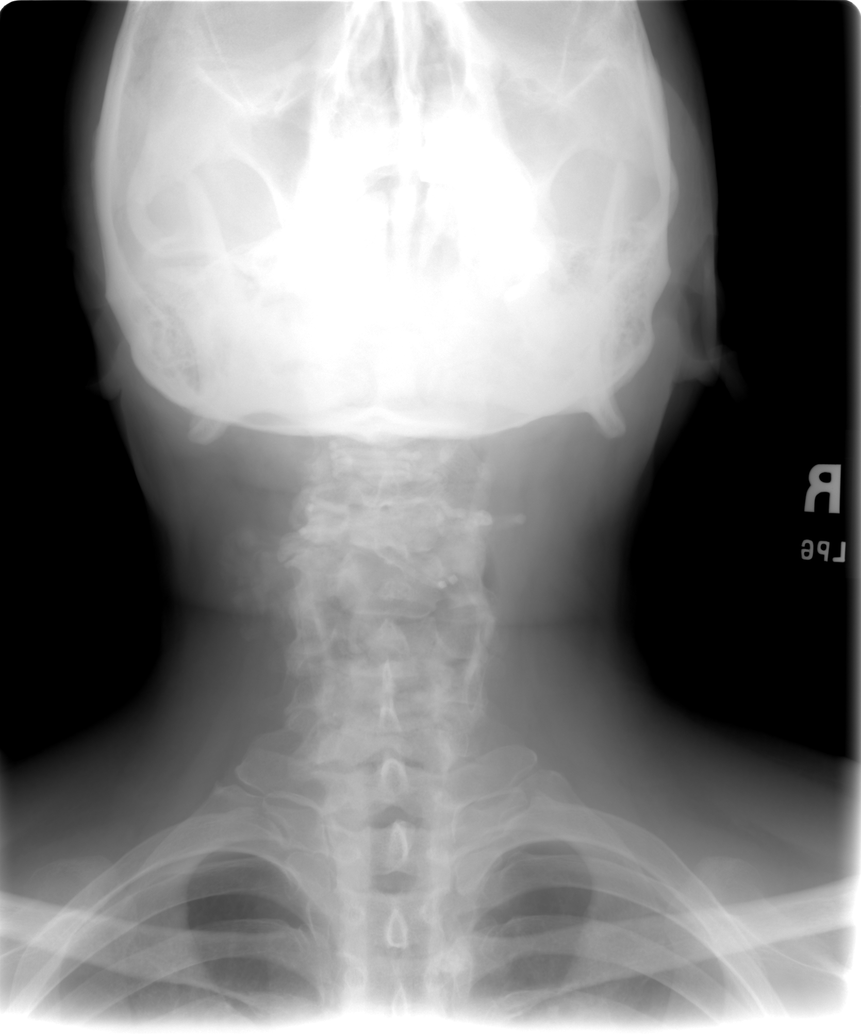

[6 of 6 positions shown; findings below may reference images not displayed]

FINDINGS: Advanced degenerative cervical spondylosis with disc
disease and facet disease in the mid and lower cervical spine.
Overall alignment is maintained.  No acute bony findings or
abnormal prevertebral soft tissue swelling.  There are large near
bridging anterior osteophytes and moderate uncinate spurring
changes.  Multilevel foraminal narrowing is noted bilaterally.

The C1-2 articulations are maintained.  Lung apices are clear.
IMPRESSION: 1.  Advanced degenerative cervical spondylosis with multilevel disc
disease and facet disease.
2.  Uncinate spurring changes with multilevel foraminal narrowing
bilaterally.
3.  No acute bony findings.

## 2013-08-12 DIAGNOSIS — I219 Acute myocardial infarction, unspecified: Secondary | ICD-10-CM

## 2013-08-12 HISTORY — DX: Acute myocardial infarction, unspecified: I21.9

## 2013-11-10 DIAGNOSIS — R079 Chest pain, unspecified: Secondary | ICD-10-CM

## 2013-11-10 HISTORY — DX: Chest pain, unspecified: R07.9

## 2013-11-27 ENCOUNTER — Emergency Department (HOSPITAL_COMMUNITY): Payer: Medicaid Other

## 2013-11-27 ENCOUNTER — Observation Stay (HOSPITAL_COMMUNITY)
Admission: EM | Admit: 2013-11-27 | Discharge: 2013-11-29 | Disposition: A | Discharge status: Home / Self Care | Payer: Medicaid Other | Attending: Internal Medicine | Admitting: Internal Medicine

## 2013-11-27 ENCOUNTER — Encounter (HOSPITAL_COMMUNITY): Payer: Self-pay | Admitting: Emergency Medicine

## 2013-11-27 DIAGNOSIS — R202 Paresthesia of skin: Secondary | ICD-10-CM

## 2013-11-27 DIAGNOSIS — R05 Cough: Secondary | ICD-10-CM | POA: Insufficient documentation

## 2013-11-27 DIAGNOSIS — E1165 Type 2 diabetes mellitus with hyperglycemia: Secondary | ICD-10-CM

## 2013-11-27 DIAGNOSIS — IMO0001 Reserved for inherently not codable concepts without codable children: Secondary | ICD-10-CM | POA: Insufficient documentation

## 2013-11-27 DIAGNOSIS — Z9119 Patient's noncompliance with other medical treatment and regimen: Secondary | ICD-10-CM | POA: Insufficient documentation

## 2013-11-27 DIAGNOSIS — J189 Pneumonia, unspecified organism: Secondary | ICD-10-CM

## 2013-11-27 DIAGNOSIS — IMO0002 Reserved for concepts with insufficient information to code with codable children: Secondary | ICD-10-CM | POA: Diagnosis present

## 2013-11-27 DIAGNOSIS — R209 Unspecified disturbances of skin sensation: Secondary | ICD-10-CM | POA: Diagnosis not present

## 2013-11-27 DIAGNOSIS — R0789 Other chest pain: Secondary | ICD-10-CM | POA: Diagnosis present

## 2013-11-27 DIAGNOSIS — R079 Chest pain, unspecified: Secondary | ICD-10-CM | POA: Diagnosis present

## 2013-11-27 DIAGNOSIS — R059 Cough, unspecified: Secondary | ICD-10-CM | POA: Diagnosis not present

## 2013-11-27 DIAGNOSIS — Z91199 Patient's noncompliance with other medical treatment and regimen due to unspecified reason: Secondary | ICD-10-CM | POA: Insufficient documentation

## 2013-11-27 DIAGNOSIS — Z981 Arthrodesis status: Secondary | ICD-10-CM | POA: Diagnosis not present

## 2013-11-27 HISTORY — DX: Type 2 diabetes mellitus without complications: E11.9

## 2013-11-27 LAB — GLUCOSE, CAPILLARY
Glucose-Capillary: 149 mg/dL — ABNORMAL HIGH (ref 70–99)
Glucose-Capillary: 161 mg/dL — ABNORMAL HIGH (ref 70–99)

## 2013-11-27 LAB — BASIC METABOLIC PANEL
BUN: 13 mg/dL (ref 6–23)
CO2: 23 mEq/L (ref 19–32)
Calcium: 9.2 mg/dL (ref 8.4–10.5)
Chloride: 99 mEq/L (ref 96–112)
Creatinine, Ser: 0.7 mg/dL (ref 0.50–1.35)
GFR calc Af Amer: >90 mL/min (ref >90)
GFR calc non Af Amer: >90 mL/min (ref >90)
Glucose, Bld: 212 mg/dL — ABNORMAL HIGH (ref 70–99)
Potassium: 4.1 mEq/L (ref 3.7–5.3)
Sodium: 135 mEq/L — ABNORMAL LOW (ref 137–147)

## 2013-11-27 LAB — CBC
HCT: 39.2 % (ref 39.0–52.0)
Hemoglobin: 14.3 g/dL (ref 13.0–17.0)
MCH: 29.7 pg (ref 26.0–34.0)
MCHC: 36.5 g/dL — ABNORMAL HIGH (ref 30.0–36.0)
MCV: 81.5 fL (ref 78.0–100.0)
Platelets: 194 10*3/uL (ref 150–400)
RBC: 4.81 MIL/uL (ref 4.22–5.81)
RDW: 12.3 % (ref 11.5–15.5)
WBC: 8.2 10*3/uL (ref 4.0–10.5)

## 2013-11-27 LAB — D-DIMER, QUANTITATIVE (NOT AT ARMC): D-Dimer, Quant: 0.76 ug/mL-FEU — ABNORMAL HIGH (ref 0.00–0.48)

## 2013-11-27 LAB — PRO B NATRIURETIC PEPTIDE: Pro B Natriuretic peptide (BNP): 228.8 pg/mL — ABNORMAL HIGH (ref 0–125)

## 2013-11-27 LAB — I-STAT TROPONIN, ED: Troponin i, poc: 0 ng/mL (ref 0.00–0.08)

## 2013-11-27 LAB — TROPONIN I
Troponin I: <0.3 ng/mL (ref <0.30)
Troponin I: <0.3 ng/mL (ref <0.30)

## 2013-11-27 IMAGING — CT CT ANGIO CHEST
1 of 2 series · 19 of 32 positions shown · IV contrast (OMNIPAQUE 350)
Comparison: Plain films of earlier in the day.  No prior CT.

CLINICAL DATA: Left-sided chest pain. Shortness of breath with arm
and leg numbness. Recent neck surgery. Elevated D-dimer. Diabetes.

EXAM:
CT ANGIOGRAPHY CHEST WITH CONTRAST
TECHNIQUE: Multidetector CT imaging of the chest was performed using the
standard protocol during bolus administration of intravenous
contrast. Multiplanar CT image reconstructions and MIPs were
obtained to evaluate the vascular anatomy.
CONTRAST:  100mL OMNIPAQUE IOHEXOL 350 MG/ML SOLN

[Series 6: thins for pacs · axial · 0.68mm/px · z∈[-268,-51]mm · 19 of 243 slices shown]
[im 13/243  lung]
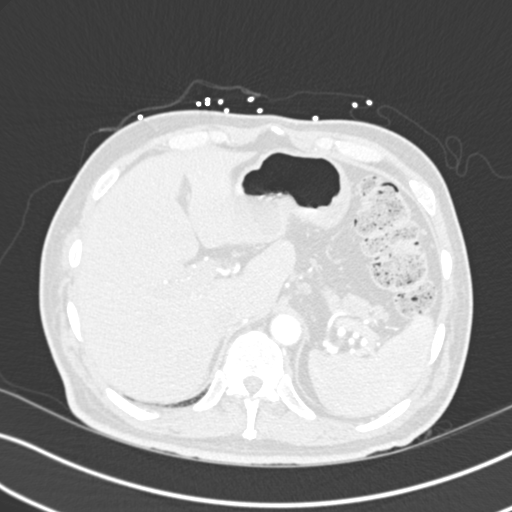
[im 25/243  mediastinal]
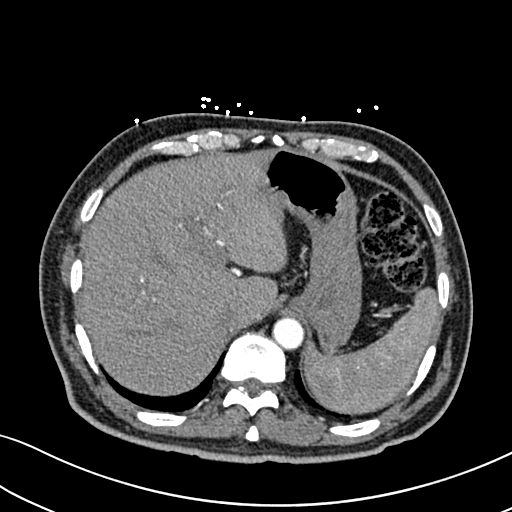
[im 37/243  lung]
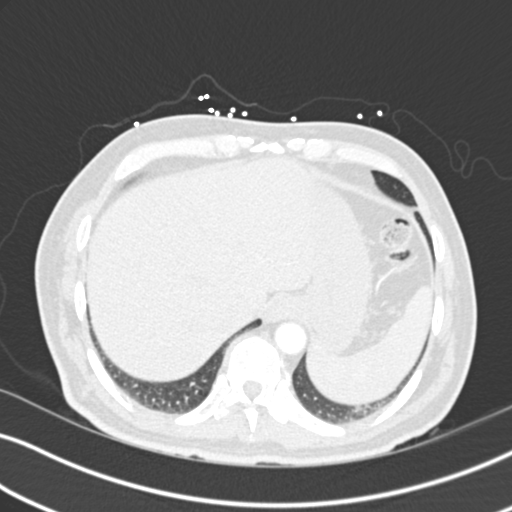
[im 61/243  mediastinal]
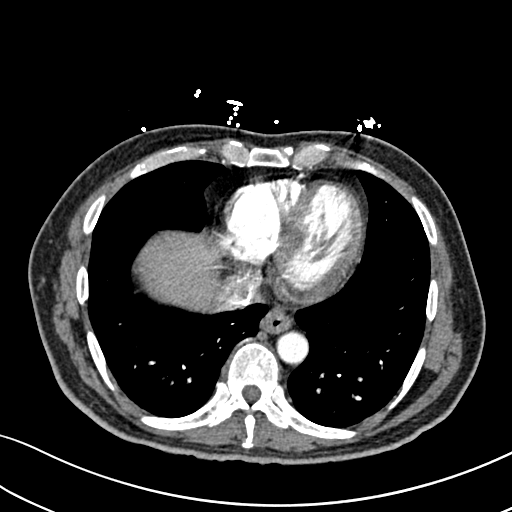
[im 73/243  lung]
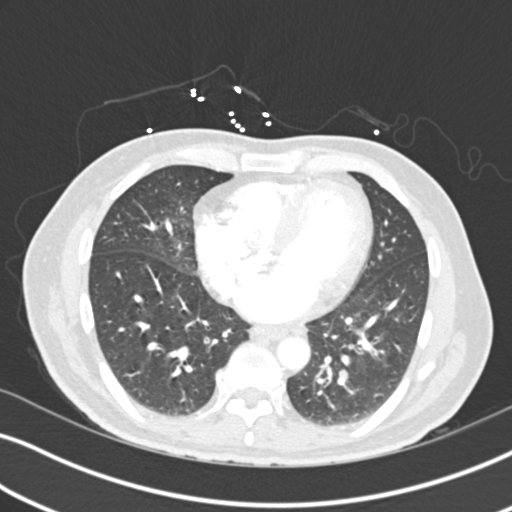
[im 81/243  mediastinal]
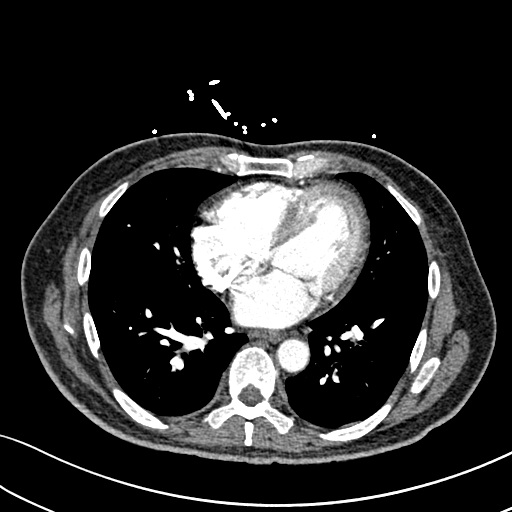
[im 85/243  lung]
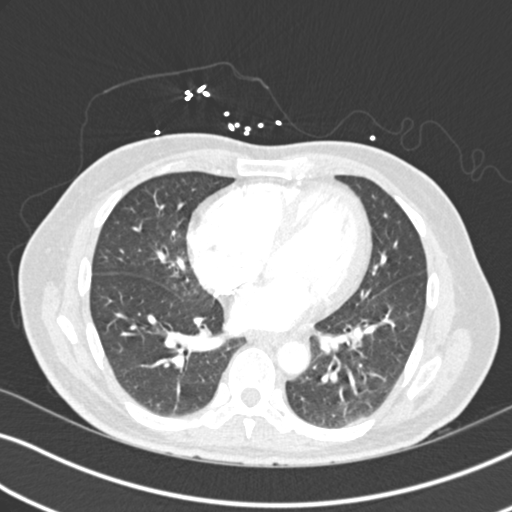
[im 97/243  mediastinal]
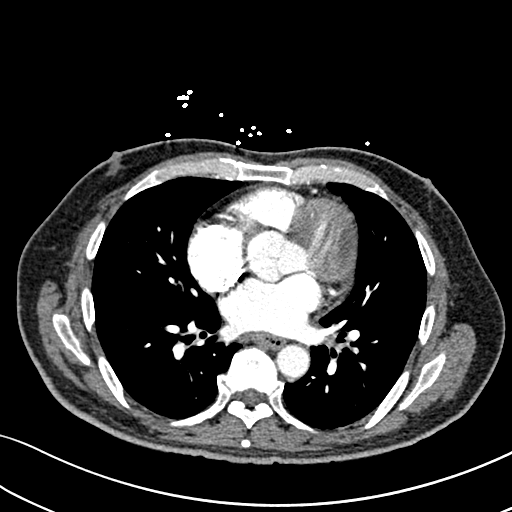
[im 109/243  lung]
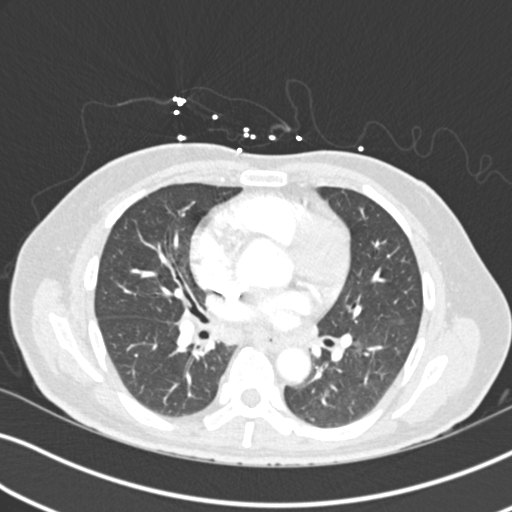
[im 122/243  mediastinal]
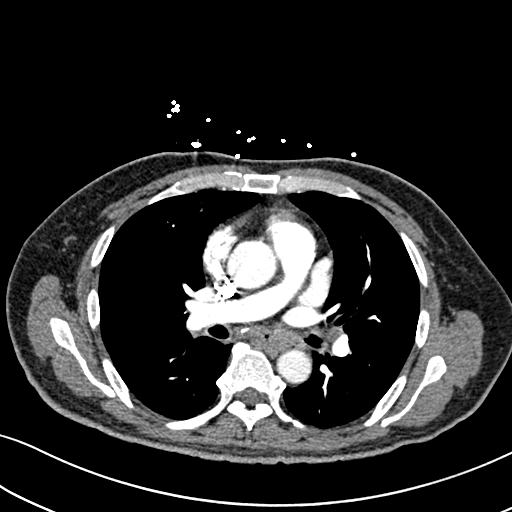
[im 134/243  lung]
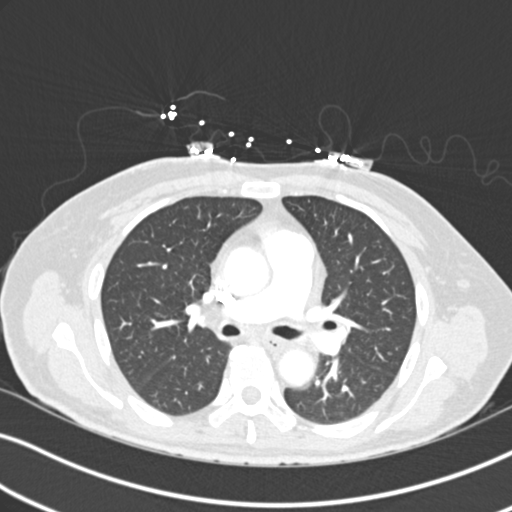
[im 146/243  mediastinal]
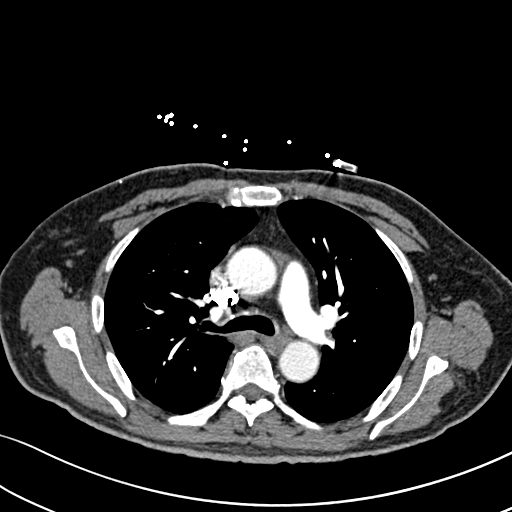
[im 158/243  lung]
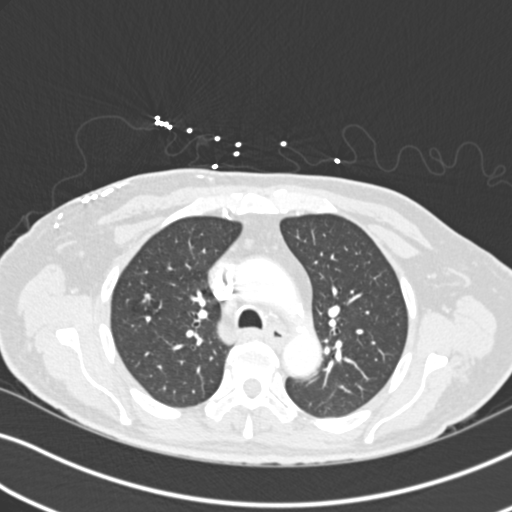
[im 162/243  mediastinal]
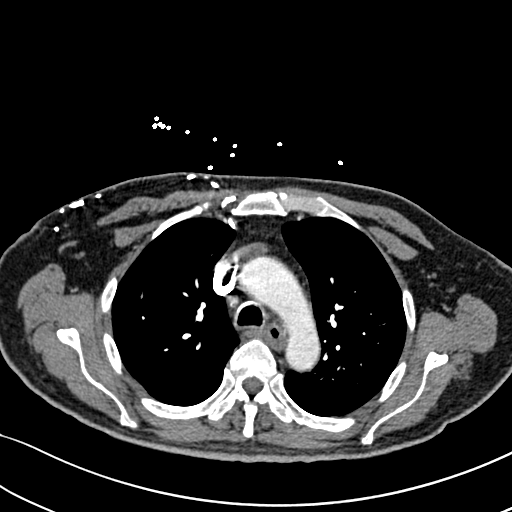
[im 170/243  lung]
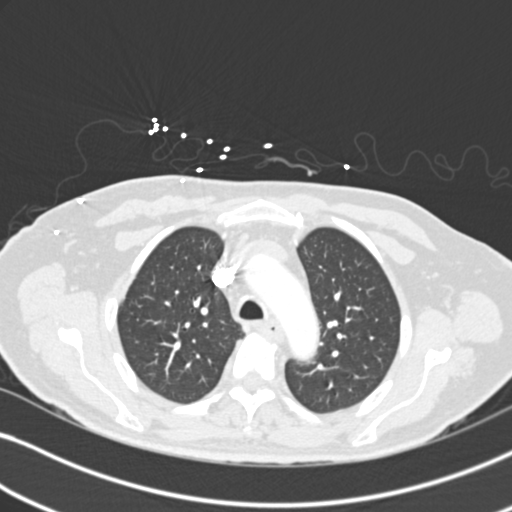
[im 182/243  mediastinal]
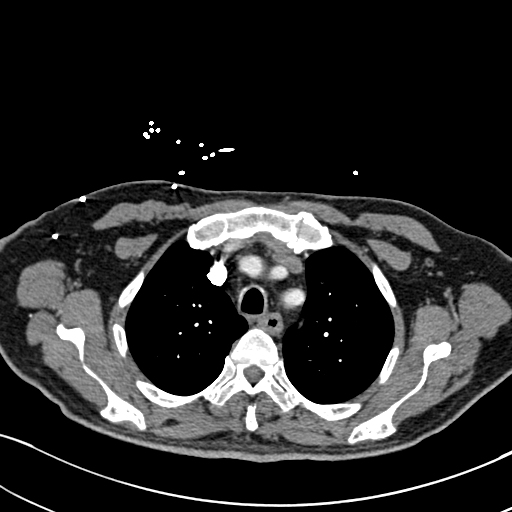
[im 206/243  lung]
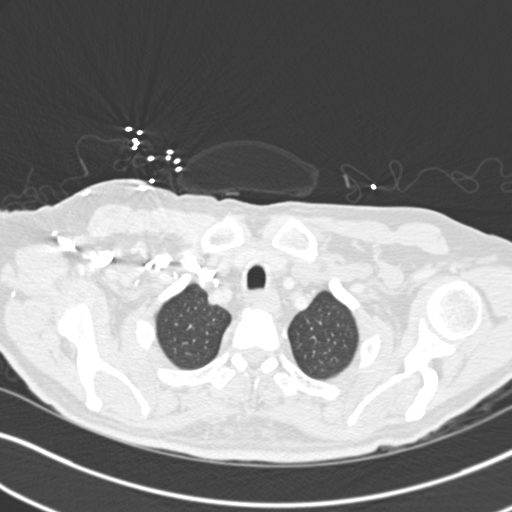
[im 218/243  mediastinal]
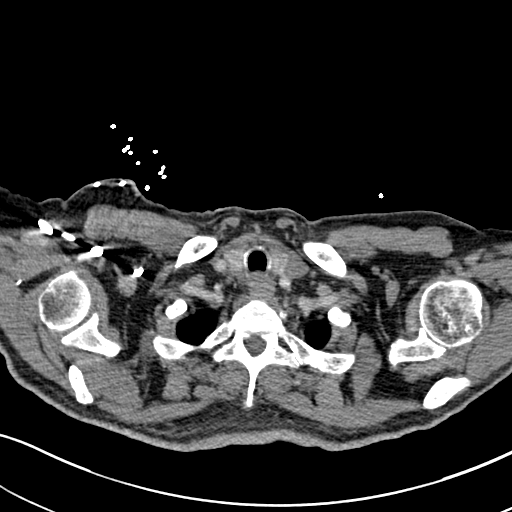
[im 230/243  lung]
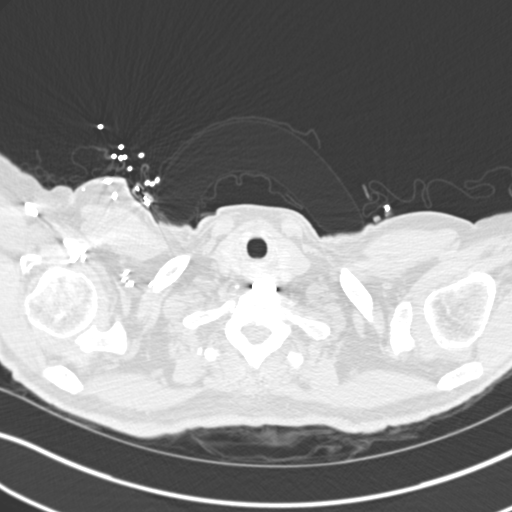

[19 of 32 positions shown; findings below may reference images not displayed]

FINDINGS: Lungs/Pleura: Mild lower lobe predominant bronchial wall thickening.
Subsegmental atelectasis in the right upper lobe.

Mild bronchial wall thickening and peribronchovascular nodularity
within the right middle lobe. Most apparent on sagittal image 64 and
coronal image 42.

No pleural fluid.

Heart/Mediastinum: The quality of this examination for evaluation of
pulmonary embolism is good. Mild motion degradation at the bases. No
evidence of pulmonary embolism.

Normal aortic caliber without dissection. Heart size upper normal,
without pericardial effusion. 12 mm azygos esophageal recess node on
image 45. A left mediastinal node measures 9 mm on image 44.

Upper Abdomen:  Mildly prominent caudate lobe, incompletely imaged.

Bones/Musculoskeletal:  Cervical spine fixation.

Review of the MIP images confirms the above findings.
IMPRESSION: 1. No evidence of pulmonary embolism. Mildly motion degraded
evaluation of the lung bases.
2. Right middle lobe subtle peribronchovascular nodularity,
suspicious for atypical infection.
3. Mild lower mediastinal adenopathy. Favored to be reactive. Chest
CT followup at 3-6 months could confirm resolution or stability.

## 2013-11-27 IMAGING — CR DG CHEST 2V
2 series · 2 of 2 positions shown · non-contrast
Comparison: [DATE]

CLINICAL DATA: Chest pain.  Diabetes.

EXAM:
CHEST  2 VIEW

[w chest pa]
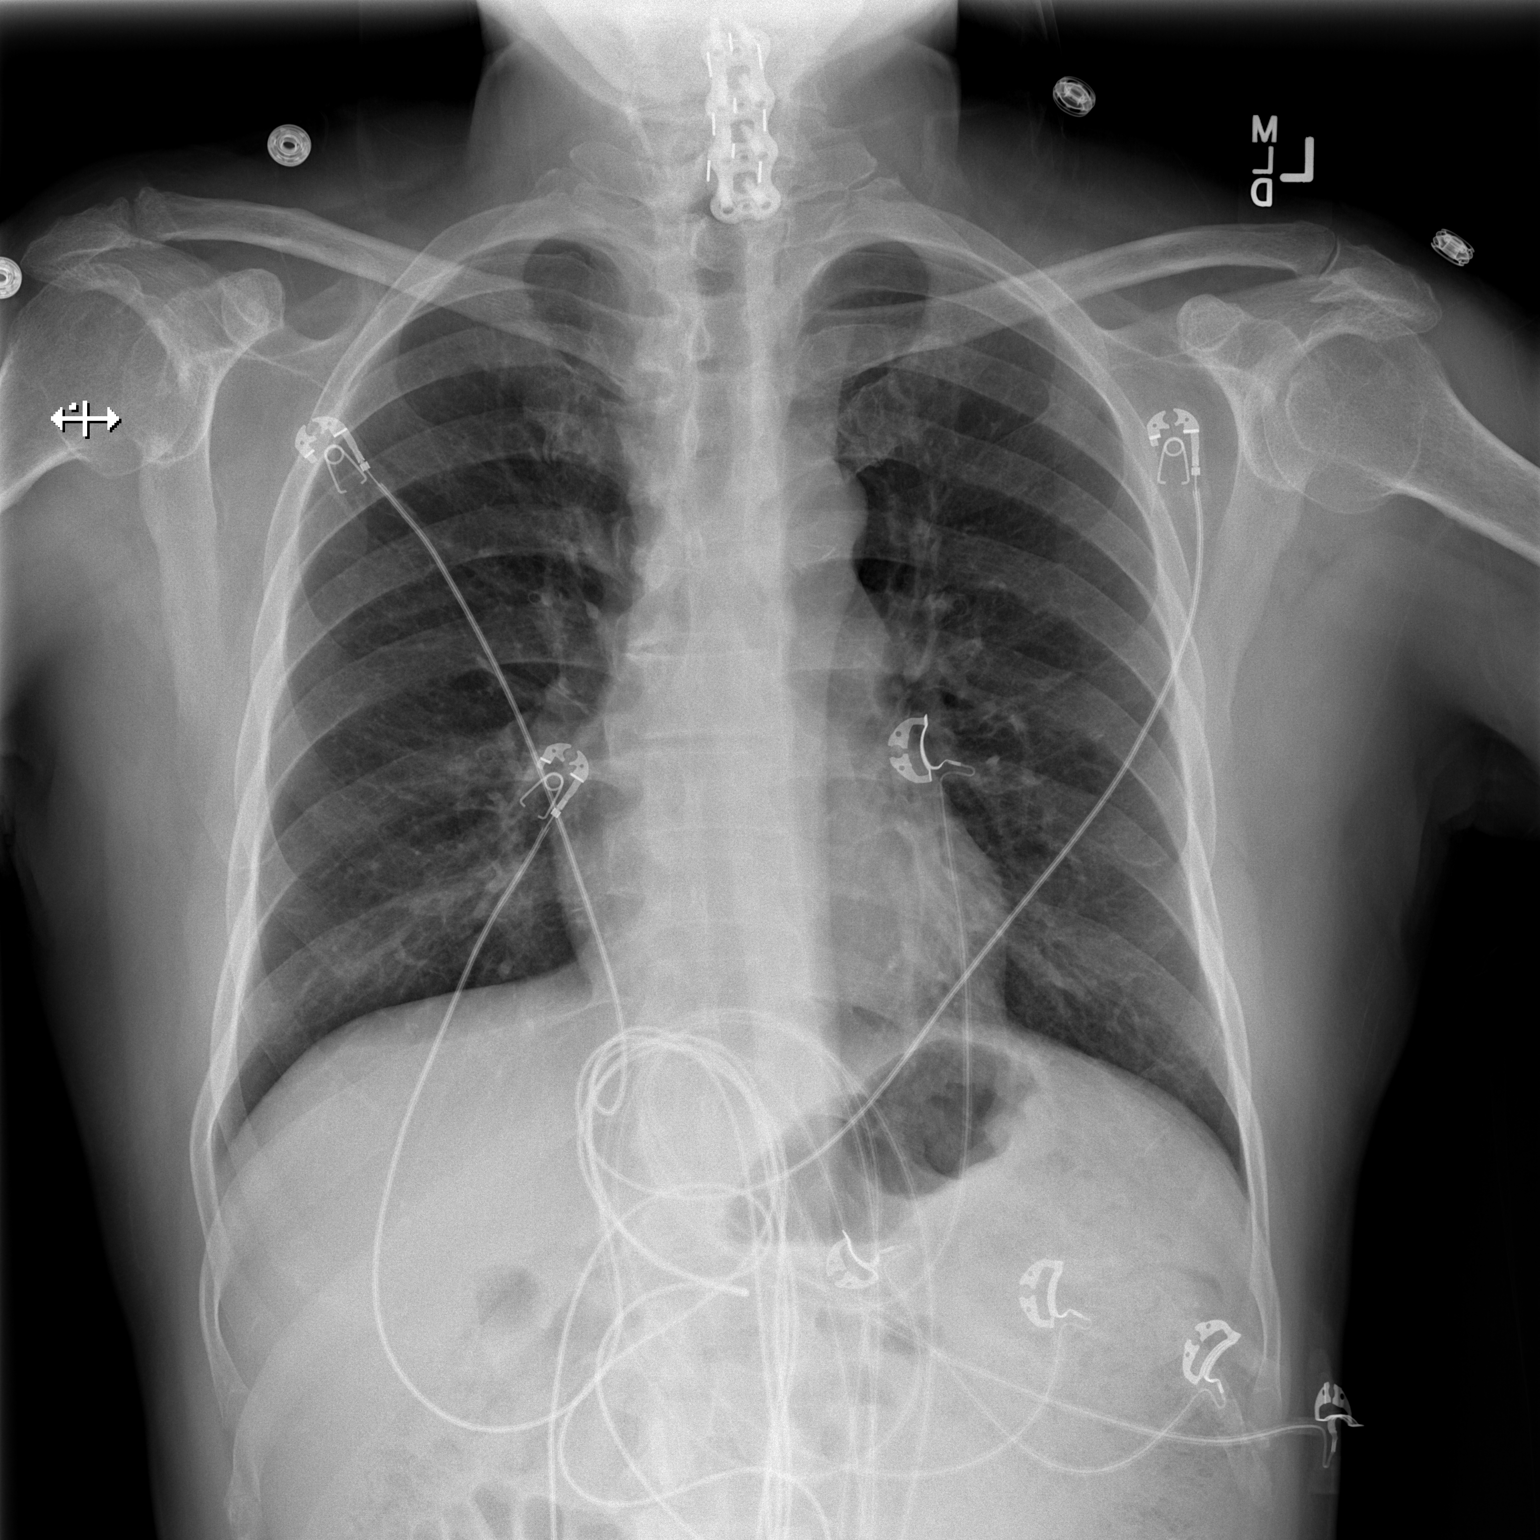

[w chest lat]
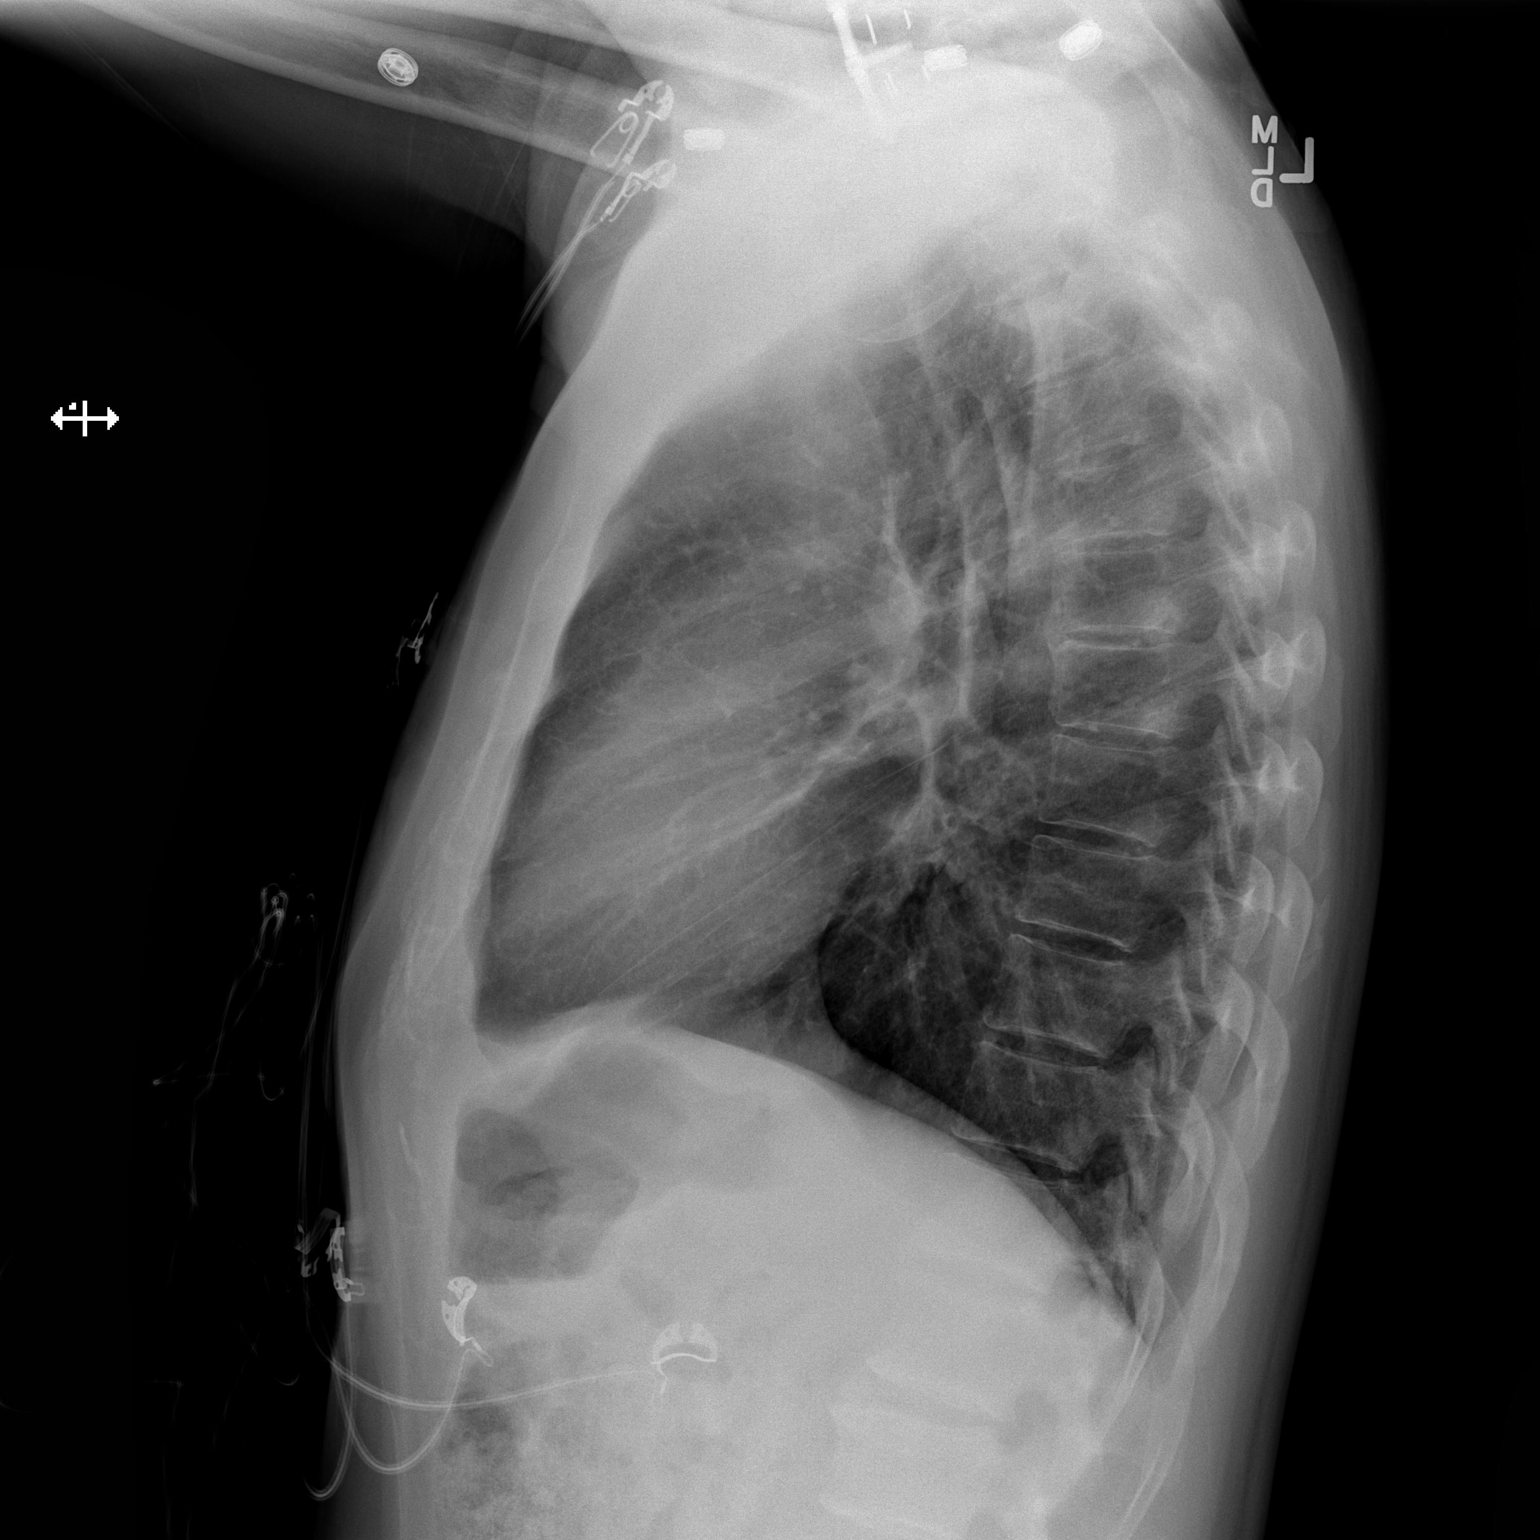

[2 of 2 positions shown; findings below may reference images not displayed]

FINDINGS: The heart size and mediastinal contours are within normal limits.
Both lungs are clear. No evidence of pleural effusion. No mass or
lymphadenopathy identified. Cervical spine fusion hardware noted.
IMPRESSION: No active cardiopulmonary disease.

## 2013-11-27 MED ORDER — VANCOMYCIN HCL IN DEXTROSE 1-5 GM/200ML-% IV SOLN
1000.0000 mg | Freq: Three times a day (TID) | INTRAVENOUS | Status: DC
  Filled 2013-11-27: qty 200

## 2013-11-27 MED ORDER — MORPHINE SULFATE 4 MG/ML IJ SOLN
4.0000 mg | Freq: Once | INTRAMUSCULAR | Status: AC
  Administered 2013-11-27: 4 mg via INTRAVENOUS
  Filled 2013-11-27: qty 1

## 2013-11-27 MED ORDER — DEXTROSE 5 % IV SOLN
1.0000 g | Freq: Once | INTRAVENOUS | Status: AC
  Administered 2013-11-27: 1 g via INTRAVENOUS
  Filled 2013-11-27: qty 1

## 2013-11-27 MED ORDER — METOPROLOL TARTRATE 12.5 MG HALF TABLET
12.5000 mg | ORAL_TABLET | Freq: Two times a day (BID) | ORAL | Status: DC
  Administered 2013-11-27 – 2013-11-29 (×4): 12.5 mg via ORAL
  Filled 2013-11-27 (×5): qty 1

## 2013-11-27 MED ORDER — ASPIRIN 325 MG PO TABS
325.0000 mg | ORAL_TABLET | Freq: Every day | ORAL | Status: DC
  Administered 2013-11-28 – 2013-11-29 (×2): 325 mg via ORAL
  Filled 2013-11-27 (×3): qty 1

## 2013-11-27 MED ORDER — ACETAMINOPHEN 650 MG RE SUPP: 650.0000 mg | Freq: Four times a day (QID) | RECTAL | Status: DC | PRN

## 2013-11-27 MED ORDER — ONDANSETRON HCL 4 MG PO TABS: 4.0000 mg | ORAL_TABLET | Freq: Four times a day (QID) | ORAL | Status: DC | PRN

## 2013-11-27 MED ORDER — ENOXAPARIN SODIUM 40 MG/0.4ML ~~LOC~~ SOLN
40.0000 mg | SUBCUTANEOUS | Status: DC
  Administered 2013-11-27 – 2013-11-28 (×2): 40 mg via SUBCUTANEOUS
  Filled 2013-11-27 (×3): qty 0.4

## 2013-11-27 MED ORDER — ACETAMINOPHEN 325 MG PO TABS
650.0000 mg | ORAL_TABLET | Freq: Four times a day (QID) | ORAL | Status: DC | PRN
  Administered 2013-11-27: 650 mg via ORAL
  Filled 2013-11-27: qty 2

## 2013-11-27 MED ORDER — SODIUM CHLORIDE 0.9 % IJ SOLN
3.0000 mL | Freq: Two times a day (BID) | INTRAMUSCULAR | Status: DC
  Administered 2013-11-27 – 2013-11-28 (×3): 3 mL via INTRAVENOUS

## 2013-11-27 MED ORDER — DEXTROSE 5 % IV SOLN: 1.0000 g | Freq: Three times a day (TID) | INTRAVENOUS | Status: DC

## 2013-11-27 MED ORDER — LEVOFLOXACIN 750 MG PO TABS
750.0000 mg | ORAL_TABLET | Freq: Every day | ORAL | Status: DC
  Administered 2013-11-28 – 2013-11-29 (×2): 750 mg via ORAL
  Filled 2013-11-27 (×2): qty 1

## 2013-11-27 MED ORDER — INSULIN GLARGINE 100 UNIT/ML ~~LOC~~ SOLN
10.0000 [IU] | Freq: Every day | SUBCUTANEOUS | Status: DC
  Administered 2013-11-27 – 2013-11-28 (×2): 10 [IU] via SUBCUTANEOUS
  Filled 2013-11-27 (×3): qty 0.1

## 2013-11-27 MED ORDER — ONDANSETRON HCL 4 MG/2ML IJ SOLN
4.0000 mg | Freq: Once | INTRAMUSCULAR | Status: AC
  Administered 2013-11-27: 4 mg via INTRAVENOUS
  Filled 2013-11-27: qty 2

## 2013-11-27 MED ORDER — HYDROCODONE-ACETAMINOPHEN 5-325 MG PO TABS: 1.0000 | ORAL_TABLET | Freq: Every day | ORAL | Status: DC | PRN

## 2013-11-27 MED ORDER — VANCOMYCIN HCL IN DEXTROSE 1-5 GM/200ML-% IV SOLN: 1000.0000 mg | INTRAVENOUS | Status: DC

## 2013-11-27 MED ORDER — IOHEXOL 350 MG/ML SOLN
100.0000 mL | Freq: Once | INTRAVENOUS | Status: AC | PRN
  Administered 2013-11-27: 100 mL via INTRAVENOUS

## 2013-11-27 MED ORDER — INSULIN ASPART 100 UNIT/ML ~~LOC~~ SOLN
0.0000 [IU] | Freq: Three times a day (TID) | SUBCUTANEOUS | Status: DC
  Administered 2013-11-27: 1 [IU] via SUBCUTANEOUS
  Administered 2013-11-28: 3 [IU] via SUBCUTANEOUS

## 2013-11-27 MED ORDER — ASPIRIN 81 MG PO CHEW
324.0000 mg | CHEWABLE_TABLET | Freq: Once | ORAL | Status: AC
  Administered 2013-11-27: 324 mg via ORAL
  Filled 2013-11-27: qty 4

## 2013-11-27 MED ORDER — ONDANSETRON HCL 4 MG/2ML IJ SOLN: 4.0000 mg | Freq: Four times a day (QID) | INTRAMUSCULAR | Status: DC | PRN

## 2013-11-27 NOTE — ED Provider Notes (Signed)
CSN: [id number]     Arrival date & time 11/27/13  1231 History   First MD Initiated Contact with Patient 11/27/13 1246     Chief Complaint  Patient presents with  . Chest Pain  . Numbness     (Consider location/radiation/quality/duration/timing/severity/associated sxs/prior Treatment) HPI Comments: Patient presents with chest pain and shortness of breath. Has a history of a recent cervical fusion which was done a [facility] by Dr. Marlin Canary. This was done February 10. He states she's been doing well in fact had a followup appointment with his surgeon last week. About 2-3 days ago he started havin some intermittent numbness in his arms and his legs. He states he feels that his whole body gets heavy.  He's also been having some chest heaviness to the left side of his chest radiating down his left arm. He describes no associated shortness of breath. Occasionally he feels lightheaded. He denies any new weakness in his arms or his legs. He states the numbness in his arms and his legs is worse with sitting or lying down and is better when he did ambulate pain. The chest tightness does seem to be more pronounced when he feels the numbness to his extremities. He denies any history of past cardiac problems however he does have diabetes and has been out of his medications for about 2 months. He denies any cough or congestion. He does note that it's slightly worse with inspiration. He denies any leg swelling that he's had some discomfort in his right calf and the back of his right thigh.  Patient is a 58 y.o. male presenting with chest pain.  Chest Pain Associated symptoms: fatigue, numbness and shortness of breath   Associated symptoms: no abdominal pain, no back pain, no cough, no diaphoresis, no dizziness, no fever, no headache, no nausea, not vomiting and no weakness     Past Medical History  Diagnosis Date  . Diabetes mellitus without complication    Past Surgical History  Procedure  Laterality Date  . Neck surgery     History reviewed. No pertinent family history. History  Substance Use Topics  . Smoking status: Never Smoker   . Smokeless tobacco: Not on file  . Alcohol Use: No    Review of Systems  Constitutional: Positive for fatigue. Negative for fever, chills and diaphoresis.  HENT: Negative for congestion, rhinorrhea and sneezing.   Eyes: Negative.   Respiratory: Positive for shortness of breath. Negative for cough and chest tightness.   Cardiovascular: Positive for chest pain. Negative for leg swelling.  Gastrointestinal: Negative for nausea, vomiting, abdominal pain, diarrhea and blood in stool.  Genitourinary: Negative for frequency, hematuria, flank pain and difficulty urinating.  Musculoskeletal: Positive for neck pain. Negative for arthralgias and back pain.  Skin: Negative for rash.  Neurological: Positive for light-headedness and numbness. Negative for dizziness, speech difficulty, weakness and headaches.      Allergies  Review of patient's allergies indicates no known allergies.  Home Medications   Prior to Admission medications   Medication Sig Start Date End Date Taking? Authorizing Provider  HYDROcodone-acetaminophen (NORCO/VICODIN) 5-325 MG per tablet Take 1 tablet by mouth daily as needed for moderate pain.   Yes Historical Provider, MD  insulin glargine (LANTUS SOLOSTAR) 100 UNIT/ML injection Inject 10 Units into the skin at bedtime. 11/25/11 11/24/12  Kemper Durie, PA-C  insulin lispro (HUMALOG) 100 UNIT/ML injection Inject 10 Units into the skin 3 (three) times daily before meals.    Historical  Provider, MD  metFORMIN (GLUCOPHAGE) 500 MG tablet Take 500 mg by mouth 2 (two) times daily with a meal.    Historical Provider, MD   BP 113/68  Pulse 96  Temp(Src) 98.3 F (36.8 C) (Oral)  Resp 10  SpO2 99% Physical Exam  Constitutional: He is oriented to person, place, and time. He appears well-developed and well-nourished.  HENT:   Head: Normocephalic and atraumatic.  Eyes: Pupils are equal, round, and reactive to light.  Neck: Normal range of motion. Neck supple.  He has an anterior incision appears to be healing well. There's no swelling or signs of infection.  Cardiovascular: Normal rate, regular rhythm and normal heart sounds.   Pulmonary/Chest: Effort normal and breath sounds normal. No respiratory distress. He has no wheezes. He has no rales. He exhibits no tenderness.  Abdominal: Soft. Bowel sounds are normal. There is no tenderness. There is no rebound and no guarding.  Musculoskeletal: Normal range of motion. He exhibits no edema.  Mild tenderness to posterior right calf.  No edema noted  Lymphadenopathy:    He has no cervical adenopathy.  Neurological: He is alert and oriented to person, place, and time.  4/5 motor strength to his arms bilaterally. He states this is unchanged from baseline. Some diminished sensation to light touch bilaterally in his arms and his legs. Normal motor function in his lower extremities.  Skin: Skin is warm and dry. No rash noted.  Psychiatric: He has a normal mood and affect.    ED Course  Procedures (including critical care time) Labs Review Results for orders placed during the hospital encounter of 11/27/13  CBC      Result Value Ref Range   WBC 8.2  4.0 - 10.5 K/uL   RBC 4.81  4.22 - 5.81 MIL/uL   Hemoglobin 14.3  13.0 - 17.0 g/dL   HCT 39.2  39.0 - 52.0 %   MCV 81.5  78.0 - 100.0 fL   MCH 29.7  26.0 - 34.0 pg   MCHC 36.5 (*) 30.0 - 36.0 g/dL   RDW 12.3  11.5 - 15.5 %   Platelets 194  150 - 400 K/uL  BASIC METABOLIC PANEL      Result Value Ref Range   Sodium 135 (*) 137 - 147 mEq/L   Potassium 4.1  3.7 - 5.3 mEq/L   Chloride 99  96 - 112 mEq/L   CO2 23  19 - 32 mEq/L   Glucose, Bld 212 (*) 70 - 99 mg/dL   BUN 13  6 - 23 mg/dL   Creatinine, Ser 0.70  0.50 - 1.35 mg/dL   Calcium 9.2  8.4 - 10.5 mg/dL   GFR calc non Af Amer >90  >90 mL/min   GFR calc Af Amer  >90  >90 mL/min  PRO B NATRIURETIC PEPTIDE      Result Value Ref Range   Pro B Natriuretic peptide (BNP) 228.8 (*) 0 - 125 pg/mL  D-DIMER, QUANTITATIVE      Result Value Ref Range   D-Dimer, Quant 0.76 (*) 0.00 - 0.48 ug/mL-FEU  I-STAT TROPOININ, ED      Result Value Ref Range   Troponin i, poc 0.00  0.00 - 0.08 ng/mL   Comment 3            Dg Chest 2 View  11/27/2013   CLINICAL DATA:  Chest pain.  Diabetes.  EXAM: CHEST  2 VIEW  COMPARISON:  09/20/2013  FINDINGS: The heart size and mediastinal  contours are within normal limits. Both lungs are clear. No evidence of pleural effusion. No mass or lymphadenopathy identified. Cervical spine fusion hardware noted.  IMPRESSION: No active cardiopulmonary disease.   Electronically Signed   By: Earle Gell M.D.   On: 11/27/2013 13:30     Date: 11/27/2013  Rate: 99  Rhythm: normal sinus rhythm  QRS Axis: normal  Intervals: normal  ST/T Wave abnormalities: normal  Conduction Disutrbances:none  Narrative Interpretation:   Old EKG Reviewed: none available    Imaging Review Dg Chest 2 View  11/27/2013   CLINICAL DATA:  Chest pain.  Diabetes.  EXAM: CHEST  2 VIEW  COMPARISON:  09/20/2013  FINDINGS: The heart size and mediastinal contours are within normal limits. Both lungs are clear. No evidence of pleural effusion. No mass or lymphadenopathy identified. Cervical spine fusion hardware noted.  IMPRESSION: No active cardiopulmonary disease.   Electronically Signed   By: Earle Gell M.D.   On: 11/27/2013 13:30   Ct Angio Chest Pe W/cm &/or Wo Cm  11/27/2013   CLINICAL DATA:  Left-sided chest pain. Shortness of breath with arm and leg numbness. Recent neck surgery. Elevated D-dimer. Diabetes.  EXAM: CT ANGIOGRAPHY CHEST WITH CONTRAST  TECHNIQUE: Multidetector CT imaging of the chest was performed using the standard protocol during bolus administration of intravenous contrast. Multiplanar CT image reconstructions and MIPs were obtained to evaluate the  vascular anatomy.  CONTRAST:  135mL OMNIPAQUE IOHEXOL 350 MG/ML SOLN  COMPARISON:  Plain films of earlier in the day.  No prior CT.  FINDINGS: Lungs/Pleura: Mild lower lobe predominant bronchial wall thickening. Subsegmental atelectasis in the right upper lobe.  Mild bronchial wall thickening and peribronchovascular nodularity within the right middle lobe. Most apparent on sagittal image 64 and coronal image 42.  No pleural fluid.  Heart/Mediastinum: The quality of this examination for evaluation of pulmonary embolism is good. Mild motion degradation at the bases. No evidence of pulmonary embolism.  Normal aortic caliber without dissection. Heart size upper normal, without pericardial effusion. 12 mm azygos esophageal recess node on image 45. A left mediastinal node measures 9 mm on image 44.  Upper Abdomen:  Mildly prominent caudate lobe, incompletely imaged.  Bones/Musculoskeletal:  Cervical spine fixation.  Review of the MIP images confirms the above findings.  IMPRESSION: 1. No evidence of pulmonary embolism. Mildly motion degraded evaluation of the lung bases. 2. Right middle lobe subtle peribronchovascular nodularity, suspicious for atypical infection. 3. Mild lower mediastinal adenopathy. Favored to be reactive. Chest CT followup at 3-6 months could confirm resolution or stability.   Electronically Signed   By: Abigail Miyamoto M.D.   On: 11/27/2013 15:15     EKG Interpretation None        MDM   Final diagnoses:  Chest pain  HCAP (healthcare-associated pneumonia)  Paresthesias    Pt with uncontrolled DM, presents with CP, SOB.  No pain currently. q waves noted on EKG.  Will admit for further cardiac eval.  No evidence of PE, but has questionable pneumonia on CT chest.  Given recent hospitalization, will tx for HCAP.  Pt also with intermittent numbness to extremities s/p recent cervical fusion.  Discussed with oncall spine surgeon at [facility], Dr. Patrice Paradise [phone]).  He does not feel urgent MRI  needed today, but if symptoms continue, may go ahead and do MRI while in hospital.  Hardware from surgery is MRI safe.    Malvin Johns, MD 11/27/13 [phone]

## 2013-11-27 NOTE — Progress Notes (Signed)
ANTIBIOTIC CONSULT NOTE - INITIAL  Pharmacy Consult for Vancomycin, Antibiotic renal dose adjustment Indication: rule out pneumonia  No Known Allergies  Patient Measurements:     Vital Signs: Temp: 98.3 F (36.8 C) (04/18 1242) Temp src: Oral (04/18 1242) BP: 113/68 mmHg (04/18 1242) Pulse Rate: 96 (04/18 1310) Intake/Output from previous day:   Intake/Output from this shift:    Labs:  Recent Labs  11/27/13 1248  WBC 8.2  HGB 14.3  PLT 194  CREATININE 0.70   The CrCl is unknown because both a height and weight (above a minimum accepted value) are required for this calculation. No results found for this basename: VANCOTROUGH, VANCOPEAK, VANCORANDOM, GENTTROUGH, GENTPEAK, GENTRANDOM, TOBRATROUGH, TOBRAPEAK, TOBRARND, AMIKACINPEAK, AMIKACINTROU, AMIKACIN,  in the last 72 hours   Microbiology: No results found for this or any previous visit (from the past 720 hour(s)).  Medical History: Past Medical History  Diagnosis Date  . Diabetes mellitus without complication     Assessment: 1 yoM presents with chest pain and shortness of breath.  Patient will be admitted for further cardiac evaluation.  CT ruled out PE but may have pneumonia.  Pt had recent cervical fusion at [facility] in February.  Starting Vancomycin and Cefepime for HCAP.  Tmax: AF  WBCs: WNL  Renal: SCr 0.7, CrCl>100 ml/min  No cultures ordered.    Goal of Therapy:  Vancomycin trough level 15-20 mcg/ml  Plan:  1.  Vancomycin 1g IV q8h. 2.  Cefepime 1g IV q8h. 3.  F/u current weight for dose adjustments.  F/u SCr, trough levels, clinical course.  Ryan Wells 11/27/2013,3:43 PM

## 2013-11-27 NOTE — ED Notes (Signed)
Pt denies any chest pain at this time 

## 2013-11-27 NOTE — H&P (Signed)
[facility] History and Physical  Ryan Wells [id number] DOB: 07/07/1956 DOA: 11/27/2013  Referring physician:  PCP: No primary provider on file.  Specialists:   Chief Complaint: chest pain   HPI: Ryan Wells is a 58 y.o. male with PMH of recent cervical fusion after job accident (feb, 2015), IDDM non complaint to medications presented with intermittent substernal chest pains associated with arm numbness since yesterday; denies previous chest pains or exertional symptoms; no SOB, but has mild cough without fever for 2-3 days; denies nausea, vomiting or diarrhea;  -ED CTA showed ? Atypical PNA   Review of Systems: The patient denies anorexia, fever, weight loss,, vision loss, decreased hearing, hoarseness, chest pain, syncope, dyspnea on exertion, peripheral edema, balance deficits, hemoptysis, abdominal pain, melena, hematochezia, severe indigestion/heartburn, hematuria, incontinence, genital sores, muscle weakness, suspicious skin lesions, transient blindness, difficulty walking, depression, unusual weight change, abnormal bleeding, enlarged lymph nodes, angioedema, and breast masses.   Past Medical History  Diagnosis Date  . Diabetes mellitus without complication    Past Surgical History  Procedure Laterality Date  . Neck surgery     Social History:  reports that he has never smoked. He does not have any smokeless tobacco history on file. He reports that he does not drink alcohol or use illicit drugs. Home;  where does patient live--home, ALF, SNF? and with whom if at home? Yes;  Can patient participate in ADLs?  No Known Allergies  History reviewed. No pertinent family history. denies h/o CAD (be sure to complete)  Prior to Admission medications   Medication Sig Start Date End Date Taking? Authorizing Provider  HYDROcodone-acetaminophen (NORCO/VICODIN) 5-325 MG per tablet Take 1 tablet by mouth daily as needed for moderate pain.   Yes Historical Provider, MD  insulin  glargine (LANTUS SOLOSTAR) 100 UNIT/ML injection Inject 10 Units into the skin at bedtime. 11/25/11 11/24/12  Kemper Durie, PA-C  insulin lispro (HUMALOG) 100 UNIT/ML injection Inject 10 Units into the skin 3 (three) times daily before meals.    Historical Provider, MD  metFORMIN (GLUCOPHAGE) 500 MG tablet Take 500 mg by mouth 2 (two) times daily with a meal.    Historical Provider, MD   Physical Exam: Filed Vitals:   11/27/13 1310  BP:   Pulse: 96  Temp:   Resp: 10     General:  alert  Eyes: EOM_I  ENT: no oral ulcers   Neck: supple   Cardiovascular: s1,s2 rrr  Respiratory: CTA BL  Abdomen: soft, nt,nd   Skin: no rash  Musculoskeletal: no LE edema  Psychiatric: no hallucinations   Neurologic: CN 2-12 intact,motor 5/5 BL symmetric, sensations is intact   Labs on Admission:  Basic Metabolic Panel:  Recent Labs Lab 11/27/13 1248  NA 135*  K 4.1  CL 99  CO2 23  GLUCOSE 212*  BUN 13  CREATININE 0.70  CALCIUM 9.2   Liver Function Tests: No results found for this basename: AST, ALT, ALKPHOS, BILITOT, PROT, ALBUMIN,  in the last 168 hours No results found for this basename: LIPASE, AMYLASE,  in the last 168 hours No results found for this basename: AMMONIA,  in the last 168 hours CBC:  Recent Labs Lab 11/27/13 1248  WBC 8.2  HGB 14.3  HCT 39.2  MCV 81.5  PLT 194   Cardiac Enzymes: No results found for this basename: CKTOTAL, CKMB, CKMBINDEX, TROPONINI,  in the last 168 hours  BNP (last 3 results)  Recent Labs  11/27/13 1245  PROBNP 228.8*  CBG: No results found for this basename: GLUCAP,  in the last 168 hours  Radiological Exams on Admission: Dg Chest 2 View  11/27/2013   CLINICAL DATA:  Chest pain.  Diabetes.  EXAM: CHEST  2 VIEW  COMPARISON:  09/20/2013  FINDINGS: The heart size and mediastinal contours are within normal limits. Both lungs are clear. No evidence of pleural effusion. No mass or lymphadenopathy identified. Cervical spine  fusion hardware noted.  IMPRESSION: No active cardiopulmonary disease.   Electronically Signed   By: Earle Gell M.D.   On: 11/27/2013 13:30   Ct Angio Chest Pe W/cm &/or Wo Cm  11/27/2013   CLINICAL DATA:  Left-sided chest pain. Shortness of breath with arm and leg numbness. Recent neck surgery. Elevated D-dimer. Diabetes.  EXAM: CT ANGIOGRAPHY CHEST WITH CONTRAST  TECHNIQUE: Multidetector CT imaging of the chest was performed using the standard protocol during bolus administration of intravenous contrast. Multiplanar CT image reconstructions and MIPs were obtained to evaluate the vascular anatomy.  CONTRAST:  174mL OMNIPAQUE IOHEXOL 350 MG/ML SOLN  COMPARISON:  Plain films of earlier in the day.  No prior CT.  FINDINGS: Lungs/Pleura: Mild lower lobe predominant bronchial wall thickening. Subsegmental atelectasis in the right upper lobe.  Mild bronchial wall thickening and peribronchovascular nodularity within the right middle lobe. Most apparent on sagittal image 64 and coronal image 42.  No pleural fluid.  Heart/Mediastinum: The quality of this examination for evaluation of pulmonary embolism is good. Mild motion degradation at the bases. No evidence of pulmonary embolism.  Normal aortic caliber without dissection. Heart size upper normal, without pericardial effusion. 12 mm azygos esophageal recess node on image 45. A left mediastinal node measures 9 mm on image 44.  Upper Abdomen:  Mildly prominent caudate lobe, incompletely imaged.  Bones/Musculoskeletal:  Cervical spine fixation.  Review of the MIP images confirms the above findings.  IMPRESSION: 1. No evidence of pulmonary embolism. Mildly motion degraded evaluation of the lung bases. 2. Right middle lobe subtle peribronchovascular nodularity, suspicious for atypical infection. 3. Mild lower mediastinal adenopathy. Favored to be reactive. Chest CT followup at 3-6 months could confirm resolution or stability.   Electronically Signed   By: Abigail Miyamoto M.D.    On: 11/27/2013 15:15    EKG: Independently reviewed. NSR; q wave inf/lat;   Assessment/Plan Principal Problem:   Chest pain Active Problems:   Diabetes type 2, uncontrolled   58 y.o. male with PMH of recent cervical fusion after job accident (feb, 2015), IDDM non complaint to medications presented with intermittent substernal chest pains   1. Chest pain atypical but significant risk factors DM (not complaint to meds)+ECG changes q waves ? Old MI  -trop neg; CTA: no PE -start PO ASA, BB; check lipid profile; monitor serial ECG, trop; echo; c/s cardiology likely need stress test   2. IDDM, unclear history; patient is not on meds; last HA1C-13.4 (2013) -start ISS+lantus; check HA1C; hold metformin   3. Recent cervical fusion after job accident (feb, 2015) -exam unremarkable; strength, sensations preserved; ED d/w with oncall spine surgeon at [facility], Dr. Patrice Paradise [phone]). He does not feel urgent MRI needed today, but if symptoms continue, may go ahead and do MRI while in hospital -will monitor   4. Cough, CTA: ? atypical PNA; started atx; afebrile; no leukocytosis     None;  if consultant consulted, please document name and whether formally or informally consulted  Code Status: full (must indicate code status--if unknown or must be presumed, indicate  so) Family Communication: d/w patient, wife, son (indicate person spoken with, if applicable, with phone number if by telephone) Disposition Plan: home 24-48 hours  (indicate anticipated LOS)  Time spent: >35 minutes   [name] [facility] Pager [phone]  If 7PM-7AM, please contact night-coverage [email or web address] Password [facility] 11/27/2013, 3:53 PM

## 2013-11-27 NOTE — ED Notes (Signed)
PT c/o lt sided CP, SOB w/ arm and leg numbness since last night.  Pt has had neck surgery in February and is still wearing soft collar.  Describes pain as squeezing.  Feels dizzy, lightheaded.

## 2013-11-28 DIAGNOSIS — J189 Pneumonia, unspecified organism: Secondary | ICD-10-CM

## 2013-11-28 DIAGNOSIS — R072 Precordial pain: Secondary | ICD-10-CM

## 2013-11-28 LAB — HEMOGLOBIN A1C
Hgb A1c MFr Bld: 8.6 % — ABNORMAL HIGH (ref <5.7)
Mean Plasma Glucose: 200 mg/dL — ABNORMAL HIGH (ref <117)

## 2013-11-28 LAB — GLUCOSE, CAPILLARY
Glucose-Capillary: 105 mg/dL — ABNORMAL HIGH (ref 70–99)
Glucose-Capillary: 212 mg/dL — ABNORMAL HIGH (ref 70–99)
Glucose-Capillary: 94 mg/dL (ref 70–99)
Glucose-Capillary: 190 mg/dL — ABNORMAL HIGH (ref 70–99)

## 2013-11-28 LAB — LIPID PANEL
Cholesterol: 174 mg/dL (ref 0–200)
HDL: 41 mg/dL (ref >39)
LDL Cholesterol: 108 mg/dL — ABNORMAL HIGH (ref 0–99)
Total CHOL/HDL Ratio: 4.2 RATIO
Triglycerides: 127 mg/dL (ref <150)
VLDL: 25 mg/dL (ref 0–40)

## 2013-11-28 LAB — TROPONIN I: Troponin I: <0.3 ng/mL (ref <0.30)

## 2013-11-28 MED ORDER — PNEUMOCOCCAL VAC POLYVALENT 25 MCG/0.5ML IJ INJ
0.5000 mL | INJECTION | INTRAMUSCULAR | Status: AC
  Administered 2013-11-28: 0.5 mL via INTRAMUSCULAR
  Filled 2013-11-28 (×2): qty 0.5

## 2013-11-28 NOTE — Progress Notes (Signed)
  Echocardiogram 2D Echocardiogram has been performed.  Ryan Wells 11/28/2013, 3:53 PM

## 2013-11-28 NOTE — Progress Notes (Signed)
[facility] PROGRESS NOTE  Ryan Wells [id number] DOB: 11/22/1955 DOA: 11/27/2013 PCP: No primary provider on file.  Assessment/Plan: Principal Problem:  Chest pain  Active Problems:  Diabetes type 2, uncontrolled   58 y.o. male with PMH of recent cervical fusion after job accident (feb, 2015), IDDM non complaint to medications presented with intermittent substernal chest pains   1. Chest pain atypical but significant risk factors DM (not complaint to meds)+ECG changes q waves ? Old MI -trop neg; CTA: no PE; trop neg;  -start PO ASA, BB; pend echo; c/s cardiology likely need stress test  2. IDDM, unclear history; patient is not on meds; last HA1C-13.4 (2013)  -start ISS+lantus; HA1C-8.6; hold metformin  3. Recent cervical fusion after job accident (feb, 2015)  -exam unremarkable; strength, sensations preserved; ED d/w with oncall spine surgeon at [facility], Dr. Patrice Paradise [phone]). He does not feel urgent MRI needed today, but if symptoms continue, may go ahead and do MRI while in hospital  -will monitor  4. Cough, CTA: ? atypical PNA; started atx;  no leukocytosis      Code Status: full Family Communication: d/w patient, his wife, son (indicate person spoken with, relationship, and if by phone, the number) Disposition Plan: home 24-48 hours    Consultants:  Cardiology   Procedures:  Pend echo   Antibiotics:  levolfoxacin 4/19<<<<   (indicate start date, and stop date if known)  HPI/Subjective: alert  Objective: Filed Vitals:   11/28/13 0629  BP: 134/75  Pulse: 88  Temp: 98.5 F (36.9 C)  Resp: 17    Intake/Output Summary (Last 24 hours) at 11/28/13 1002 Last data filed at 11/28/13 0530  Gross per 24 hour  Intake    480 ml  Output      0 ml  Net    480 ml   Filed Weights   11/27/13 1640  Weight: 57.7 kg (127 lb 3.3 oz)    Exam:   General:  alert  Cardiovascular: s1,s2 rrr  Respiratory: diminished LL  Abdomen: soft,nt,nd    Musculoskeletal: no LE edema   Data Reviewed: Basic Metabolic Panel:  Recent Labs Lab 11/27/13 1248  NA 135*  K 4.1  CL 99  CO2 23  GLUCOSE 212*  BUN 13  CREATININE 0.70  CALCIUM 9.2   Liver Function Tests: No results found for this basename: AST, ALT, ALKPHOS, BILITOT, PROT, ALBUMIN,  in the last 168 hours No results found for this basename: LIPASE, AMYLASE,  in the last 168 hours No results found for this basename: AMMONIA,  in the last 168 hours CBC:  Recent Labs Lab 11/27/13 1248  WBC 8.2  HGB 14.3  HCT 39.2  MCV 81.5  PLT 194   Cardiac Enzymes:  Recent Labs Lab 11/27/13 1709 11/27/13 2257 11/28/13 0420  TROPONINI <0.30 <0.30 <0.30   BNP (last 3 results)  Recent Labs  11/27/13 1245  PROBNP 228.8*   CBG:  Recent Labs Lab 11/27/13 1639 11/27/13 2122 11/28/13 0756  GLUCAP 149* 161* 105*    No results found for this or any previous visit (from the past 240 hour(s)).   Studies: Dg Chest 2 View  11/27/2013   CLINICAL DATA:  Chest pain.  Diabetes.  EXAM: CHEST  2 VIEW  COMPARISON:  09/20/2013  FINDINGS: The heart size and mediastinal contours are within normal limits. Both lungs are clear. No evidence of pleural effusion. No mass or lymphadenopathy identified. Cervical spine fusion hardware noted.  IMPRESSION: No active cardiopulmonary disease.  Electronically Signed   By: Earle Gell M.D.   On: 11/27/2013 13:30   Ct Angio Chest Pe W/cm &/or Wo Cm  11/27/2013   CLINICAL DATA:  Left-sided chest pain. Shortness of breath with arm and leg numbness. Recent neck surgery. Elevated D-dimer. Diabetes.  EXAM: CT ANGIOGRAPHY CHEST WITH CONTRAST  TECHNIQUE: Multidetector CT imaging of the chest was performed using the standard protocol during bolus administration of intravenous contrast. Multiplanar CT image reconstructions and MIPs were obtained to evaluate the vascular anatomy.  CONTRAST:  19mL OMNIPAQUE IOHEXOL 350 MG/ML SOLN  COMPARISON:  Plain films of  earlier in the day.  No prior CT.  FINDINGS: Lungs/Pleura: Mild lower lobe predominant bronchial wall thickening. Subsegmental atelectasis in the right upper lobe.  Mild bronchial wall thickening and peribronchovascular nodularity within the right middle lobe. Most apparent on sagittal image 64 and coronal image 42.  No pleural fluid.  Heart/Mediastinum: The quality of this examination for evaluation of pulmonary embolism is good. Mild motion degradation at the bases. No evidence of pulmonary embolism.  Normal aortic caliber without dissection. Heart size upper normal, without pericardial effusion. 12 mm azygos esophageal recess node on image 45. A left mediastinal node measures 9 mm on image 44.  Upper Abdomen:  Mildly prominent caudate lobe, incompletely imaged.  Bones/Musculoskeletal:  Cervical spine fixation.  Review of the MIP images confirms the above findings.  IMPRESSION: 1. No evidence of pulmonary embolism. Mildly motion degraded evaluation of the lung bases. 2. Right middle lobe subtle peribronchovascular nodularity, suspicious for atypical infection. 3. Mild lower mediastinal adenopathy. Favored to be reactive. Chest CT followup at 3-6 months could confirm resolution or stability.   Electronically Signed   By: Abigail Miyamoto M.D.   On: 11/27/2013 15:15    Scheduled Meds: . aspirin  325 mg Oral Daily  . enoxaparin (LOVENOX) injection  40 mg Subcutaneous Q24H  . insulin aspart  0-9 Units Subcutaneous TID WC  . insulin glargine  10 Units Subcutaneous QHS  . levofloxacin  750 mg Oral Daily  . metoprolol tartrate  12.5 mg Oral BID  . pneumococcal 23 valent vaccine  0.5 mL Intramuscular Tomorrow-1000  . sodium chloride  3 mL Intravenous Q12H   Continuous Infusions:   Principal Problem:   Chest pain Active Problems:   Diabetes type 2, uncontrolled    Time spent: >35 minutes     [name]  [facility] Pager [phone]. If 7PM-7AM, please contact night-coverage at  [email or web address], password [facility] 11/28/2013, 10:02 AM  LOS: 1 day

## 2013-11-29 LAB — GLUCOSE, CAPILLARY: Glucose-Capillary: 120 mg/dL — ABNORMAL HIGH (ref 70–99)

## 2013-11-29 MED ORDER — LEVOFLOXACIN 750 MG PO TABS: 750.0000 mg | ORAL_TABLET | Freq: Every day | ORAL | Status: DC

## 2013-11-29 MED ORDER — POLYETHYLENE GLYCOL 3350 17 G PO PACK
17.0000 g | PACK | Freq: Every day | ORAL | Status: DC
  Administered 2013-11-29: 17 g via ORAL
  Filled 2013-11-29: qty 1

## 2013-11-29 MED ORDER — SENNOSIDES-DOCUSATE SODIUM 8.6-50 MG PO TABS
1.0000 | ORAL_TABLET | Freq: Two times a day (BID) | ORAL | Status: DC
  Administered 2013-11-29: 1 via ORAL
  Filled 2013-11-29: qty 1

## 2013-11-29 MED ORDER — ASPIRIN EC 81 MG PO TBEC: 81.0000 mg | DELAYED_RELEASE_TABLET | Freq: Every day | ORAL | Status: DC

## 2013-11-29 MED ORDER — INSULIN GLARGINE 100 UNIT/ML ~~LOC~~ SOLN: 10.0000 [IU] | Freq: Every day | SUBCUTANEOUS | Status: DC

## 2013-11-29 MED ORDER — SENNOSIDES-DOCUSATE SODIUM 8.6-50 MG PO TABS: 1.0000 | ORAL_TABLET | Freq: Two times a day (BID) | ORAL | Status: DC

## 2013-11-29 MED ORDER — ACETAMINOPHEN 325 MG PO TABS: 650.0000 mg | ORAL_TABLET | Freq: Four times a day (QID) | ORAL | Status: DC | PRN

## 2013-11-29 MED ORDER — METOPROLOL TARTRATE 12.5 MG HALF TABLET: 12.5000 mg | ORAL_TABLET | Freq: Two times a day (BID) | ORAL | Status: DC

## 2013-11-29 MED ORDER — METFORMIN HCL 500 MG PO TABS: 500.0000 mg | ORAL_TABLET | Freq: Two times a day (BID) | ORAL | Status: DC

## 2013-11-29 NOTE — Discharge Summary (Signed)
Physician Discharge Summary  Ryan Wells [id number] DOB: 1956/05/30 DOA: 11/27/2013  PCP: No primary provider on file.  Admit date: 11/27/2013 Discharge date: 11/29/2013  Time spent: >35 minutes  Recommendations for Outpatient Follow-up:  F/u with PCP in 1-2 weeks  Discharge Diagnoses:  Principal Problem:   Chest pain Active Problems:   Diabetes type 2, uncontrolled   Discharge Condition: stable   Diet recommendation: DM  Filed Weights   11/27/13 1640  Weight: 57.7 kg (127 lb 3.3 oz)    History of present illness:  Principal Problem:  Chest pain  Active Problems:  Diabetes type 2, uncontrolled   58 y.o. male with PMH of recent cervical fusion after job accident (feb, 2015), IDDM non complaint to medications presented with intermittent substernal chest pains   Hospital Course:  1. Chest pain atypical but significant risk factors DM (not complaint to meds)  -trop neg; CTA: no PE; trop neg;  -chest pain resolved; echo showed normal LV function, no wall motion abnormalities; patient denies exertional symptoms;  start PO ASA, BB; recommended to f/u with PCP to arrange outpatient stress test  2. IDDM, unclear history; patient is not on meds; last HA1C-13.4 (2013)  -restarted lantus+metformin; HA1C-8.6; outpatient titration   3. Recent cervical fusion after job accident (feb, 2015)  -exam unremarkable; strength, sensations preserved; ED d/w with oncall spine surgeon at [facility], Dr. Patrice Paradise [phone]).  -recommended outpatient follow up; no new symptoms   4. Cough, CTA: ? atypical PNA; started atx; no leukocytosis      Procedures:  echo  Left ventricle: The cavity size was normal. Wall thickness was normal. Systolic function was normal. The estimated ejection fraction was in the range of 60% to 65%. Wall motion was normal; there were no regional wall motion abnormalities. Left ventricular diastolic function parameters were normal.   (i.e. Studies not automatically  included, echos, thoracentesis, etc; not x-rays)  Consultations:  none  Discharge Exam: Filed Vitals:   11/29/13 0551  BP: 104/60  Pulse: 75  Temp: 98.2 F (36.8 C)  Resp: 16    General: alert Cardiovascular: s1,s2 rrr Respiratory: CTA BL  Discharge Instructions  Discharge Orders   Future Appointments Provider Department Dept Phone   01/20/2014 9:15 AM Angelica Chessman, MD [place] [phone]   Future Orders Complete By Expires   Diet - low sodium heart healthy  As directed    Discharge instructions  As directed    Increase activity slowly  As directed        Medication List    STOP taking these medications       insulin lispro 100 UNIT/ML injection  Commonly known as:  HUMALOG      TAKE these medications       acetaminophen 325 MG tablet  Commonly known as:  TYLENOL  Take 2 tablets (650 mg total) by mouth every 6 (six) hours as needed for mild pain (or Fever >/= 101).     aspirin EC 81 MG tablet  Take 1 tablet (81 mg total) by mouth daily.     HYDROcodone-acetaminophen 5-325 MG per tablet  Commonly known as:  NORCO/VICODIN  Take 1 tablet by mouth daily as needed for moderate pain.     insulin glargine 100 UNIT/ML injection  Commonly known as:  LANTUS  Inject 0.1 mLs (10 Units total) into the skin at bedtime.     levofloxacin 750 MG tablet  Commonly known as:  LEVAQUIN  Take 1 tablet (750 mg  total) by mouth daily.     metFORMIN 500 MG tablet  Commonly known as:  GLUCOPHAGE  Take 1 tablet (500 mg total) by mouth 2 (two) times daily with a meal.     metoprolol tartrate 12.5 mg Tabs tablet  Commonly known as:  LOPRESSOR  Take 0.5 tablets (12.5 mg total) by mouth 2 (two) times daily.     senna-docusate 8.6-50 MG per tablet  Commonly known as:  Senokot-S  Take 1 tablet by mouth 2 (two) times daily.       No Known Allergies     Follow-up Information   Follow up with [facility]     . Schedule an appointment as soon as possible for a visit in 1 week.   Contact information:   [place] [id number] [phone]       The results of significant diagnostics from this hospitalization (including imaging, microbiology, ancillary and laboratory) are listed below for reference.    Significant Diagnostic Studies: Dg Chest 2 View  11/27/2013   CLINICAL DATA:  Chest pain.  Diabetes.  EXAM: CHEST  2 VIEW  COMPARISON:  09/20/2013  FINDINGS: The heart size and mediastinal contours are within normal limits. Both lungs are clear. No evidence of pleural effusion. No mass or lymphadenopathy identified. Cervical spine fusion hardware noted.  IMPRESSION: No active cardiopulmonary disease.   Electronically Signed   By: Earle Gell M.D.   On: 11/27/2013 13:30   Ct Angio Chest Pe W/cm &/or Wo Cm  11/27/2013   CLINICAL DATA:  Left-sided chest pain. Shortness of breath with arm and leg numbness. Recent neck surgery. Elevated D-dimer. Diabetes.  EXAM: CT ANGIOGRAPHY CHEST WITH CONTRAST  TECHNIQUE: Multidetector CT imaging of the chest was performed using the standard protocol during bolus administration of intravenous contrast. Multiplanar CT image reconstructions and MIPs were obtained to evaluate the vascular anatomy.  CONTRAST:  116mL OMNIPAQUE IOHEXOL 350 MG/ML SOLN  COMPARISON:  Plain films of earlier in the day.  No prior CT.  FINDINGS: Lungs/Pleura: Mild lower lobe predominant bronchial wall thickening. Subsegmental atelectasis in the right upper lobe.  Mild bronchial wall thickening and peribronchovascular nodularity within the right middle lobe. Most apparent on sagittal image 64 and coronal image 42.  No pleural fluid.  Heart/Mediastinum: The quality of this examination for evaluation of pulmonary embolism is good. Mild motion degradation at the bases. No evidence of pulmonary embolism.  Normal aortic caliber without dissection. Heart size upper normal, without pericardial  effusion. 12 mm azygos esophageal recess node on image 45. A left mediastinal node measures 9 mm on image 44.  Upper Abdomen:  Mildly prominent caudate lobe, incompletely imaged.  Bones/Musculoskeletal:  Cervical spine fixation.  Review of the MIP images confirms the above findings.  IMPRESSION: 1. No evidence of pulmonary embolism. Mildly motion degraded evaluation of the lung bases. 2. Right middle lobe subtle peribronchovascular nodularity, suspicious for atypical infection. 3. Mild lower mediastinal adenopathy. Favored to be reactive. Chest CT followup at 3-6 months could confirm resolution or stability.   Electronically Signed   By: Abigail Miyamoto M.D.   On: 11/27/2013 15:15    Microbiology: No results found for this or any previous visit (from the past 240 hour(s)).   Labs: Basic Metabolic Panel:  Recent Labs Lab 11/27/13 1248  NA 135*  K 4.1  CL 99  CO2 23  GLUCOSE 212*  BUN 13  CREATININE 0.70  CALCIUM 9.2   Liver  Function Tests: No results found for this basename: AST, ALT, ALKPHOS, BILITOT, PROT, ALBUMIN,  in the last 168 hours No results found for this basename: LIPASE, AMYLASE,  in the last 168 hours No results found for this basename: AMMONIA,  in the last 168 hours CBC:  Recent Labs Lab 11/27/13 1248  WBC 8.2  HGB 14.3  HCT 39.2  MCV 81.5  PLT 194   Cardiac Enzymes:  Recent Labs Lab 11/27/13 1709 11/27/13 2257 11/28/13 0420  TROPONINI <0.30 <0.30 <0.30   BNP: BNP (last 3 results)  Recent Labs  11/27/13 1245  PROBNP 228.8*   CBG:  Recent Labs Lab 11/28/13 0756 11/28/13 1317 11/28/13 1704 11/28/13 2126 11/29/13 0736  GLUCAP 105* 212* 94 190* 120*       Signed:  Arie Sabina Aslynn Brunetti  [facility] 11/29/2013, 9:56 AM

## 2013-11-29 NOTE — Care Management Note (Signed)
    Page 1 of 1   11/29/2013     10:29:12 AM CARE MANAGEMENT NOTE 11/29/2013  Patient:  Ryan Wells, Ryan Wells   Account Number:  [id number]  Date Initiated:  11/29/2013  Documentation initiated by:  Dessa Phi  Subjective/Objective Assessment:   58 Y/O M ADMITTED W/CHEST PAIN.     Action/Plan:   FROM HOME.   Anticipated DC Date:  11/29/2013   Anticipated DC Plan:  [place]  CM consult      Choice offered to / List presented to:             Status of service:  Completed, signed off Medicare Important Message given?   (If response is "NO", the following Medicare IM given date fields will be blank) Date Medicare IM given:   Date Additional Medicare IM given:    Discharge Disposition:  HOME/SELF CARE  Per UR Regulation:  Reviewed for med. necessity/level of care/duration of stay  If discussed at [place] of Stay Meetings, dates discussed:    Comments:

## 2013-11-29 NOTE — Progress Notes (Signed)
Pt d/c at this time with his 2 sons and wife at his side.  Pt alert and oriented. Spanish speaking limits instruction. Son translating. Writer encouraged followup appointment at [facility] and [place] to maintain medication schedule And to control diabetes.  Cervical in place.

## 2013-11-29 NOTE — Progress Notes (Signed)
Cervical collar in place upon discharge. Writer tried to explain/encourage how important it is for the patient to take his prescriptions. Son explained to pt and pt's wife, but it did not seem that they would be in compliance. Son stated that no one would "give" them their medication and that they did not have the money for it. Writer shared that some of the medications are OTC and that some are $4 medications at [facility]. Shaking of heads was all the response that Probation officer received. Writer expressed the importance of Lopressor and diabetes control. Son states that the pt goes to a "health place" and that they would not help the pt with his medications as the "place" wanted copies of the wife's paycheck and the son said they would not give a copy to them. It is noted, per the son, that the pt has an "appointment" today at 4pm to get signed up for [facility].

## 2013-12-01 ENCOUNTER — Telehealth: Payer: Self-pay | Admitting: Internal Medicine

## 2013-12-01 NOTE — Telephone Encounter (Signed)
Pt has a new patient appointment on 01/20/2014 and pt has run out of his insulin and ended up at the ED. Pt would like a refill of his insulin until his appointment here in June. Pt would like for the medication to be sent here at our pharmacy. Please contact pt as soon as possible

## 2013-12-06 ENCOUNTER — Ambulatory Visit: Payer: Medicaid Other

## 2013-12-06 ENCOUNTER — Encounter: Payer: Self-pay | Admitting: Internal Medicine

## 2013-12-06 ENCOUNTER — Ambulatory Visit: Payer: Medicaid Other | Attending: Internal Medicine | Admitting: Internal Medicine

## 2013-12-06 VITALS — BP 100/69 | HR 83 | Temp 98.5°F | Resp 16 | Ht 63.0 in | Wt 126.0 lb

## 2013-12-06 DIAGNOSIS — E119 Type 2 diabetes mellitus without complications: Secondary | ICD-10-CM | POA: Insufficient documentation

## 2013-12-06 DIAGNOSIS — N489 Disorder of penis, unspecified: Secondary | ICD-10-CM | POA: Diagnosis not present

## 2013-12-06 DIAGNOSIS — N4889 Other specified disorders of penis: Secondary | ICD-10-CM

## 2013-12-06 DIAGNOSIS — G4700 Insomnia, unspecified: Secondary | ICD-10-CM | POA: Diagnosis not present

## 2013-12-06 LAB — POCT URINALYSIS DIPSTICK
Blood, UA: NEGATIVE
Glucose, UA: NEGATIVE
Leukocytes, UA: NEGATIVE
Nitrite, UA: NEGATIVE
Protein, UA: 300
Spec Grav, UA: >=1.03
Urobilinogen, UA: 0.2
pH, UA: 5.5

## 2013-12-06 LAB — COMPLETE METABOLIC PANEL WITH GFR
ALT: 9 U/L (ref 0–53)
AST: 14 U/L (ref 0–37)
Albumin: 3.8 g/dL (ref 3.5–5.2)
Alkaline Phosphatase: 111 U/L (ref 39–117)
BUN: 20 mg/dL (ref 6–23)
CO2: 27 mEq/L (ref 19–32)
Calcium: 9.5 mg/dL (ref 8.4–10.5)
Chloride: 102 mEq/L (ref 96–112)
Creat: 0.95 mg/dL (ref 0.50–1.35)
GFR, Est African American: >89 mL/min
GFR, Est Non African American: 87 mL/min
Glucose, Bld: 120 mg/dL — ABNORMAL HIGH (ref 70–99)
Potassium: 4.7 mEq/L (ref 3.5–5.3)
Sodium: 138 mEq/L (ref 135–145)
Total Bilirubin: 0.4 mg/dL (ref 0.2–1.2)
Total Protein: 7.3 g/dL (ref 6.0–8.3)

## 2013-12-06 LAB — CBC WITH DIFFERENTIAL/PLATELET
Basophils Absolute: 0.1 10*3/uL (ref 0.0–0.1)
Basophils Relative: 1 % (ref 0–1)
Eosinophils Absolute: 0.1 10*3/uL (ref 0.0–0.7)
Eosinophils Relative: 1 % (ref 0–5)
HCT: 42.8 % (ref 39.0–52.0)
Hemoglobin: 15 g/dL (ref 13.0–17.0)
Lymphocytes Relative: 29 % (ref 12–46)
Lymphs Abs: 3.1 10*3/uL (ref 0.7–4.0)
MCH: 29.1 pg (ref 26.0–34.0)
MCHC: 35 g/dL (ref 30.0–36.0)
MCV: 82.9 fL (ref 78.0–100.0)
Monocytes Absolute: 0.6 10*3/uL (ref 0.1–1.0)
Monocytes Relative: 6 % (ref 3–12)
Neutro Abs: 6.8 10*3/uL (ref 1.7–7.7)
Neutrophils Relative %: 63 % (ref 43–77)
Platelets: 343 10*3/uL (ref 150–400)
RBC: 5.16 MIL/uL (ref 4.22–5.81)
RDW: 13.7 % (ref 11.5–15.5)
WBC: 10.8 10*3/uL — ABNORMAL HIGH (ref 4.0–10.5)

## 2013-12-06 LAB — GLUCOSE, POCT (MANUAL RESULT ENTRY): POC Glucose: 86 mg/dl (ref 70–99)

## 2013-12-06 MED ORDER — FREESTYLE LANCETS MISC: Status: DC

## 2013-12-06 MED ORDER — GLUCOSE BLOOD VI STRP: ORAL_STRIP | Status: DC

## 2013-12-06 NOTE — Patient Instructions (Signed)
Insomnio (Insomnia) El insomnio es un trastorno frecuente en la capacidad para dormirse o para Public affairs consultant dormido. Puede ser un problema crnico o un trastorno del momento. En ambos casos es un problema frecuente. Puede tratarse de un problema del momento cuando se relaciona con alguna situacin de estrs o preocupacin. Es un trastorno crnico cuando se relaciona con situaciones de Teacher, music las horas de vigilia o con malos hbitos de sueo. Con el tiempo, la privacin del sueo en s misma, puede hacer que el problema empeore. Las cosas ms pequeas se agravan debido al cansancio y a que la capacidad para enfrentarlas disminuye. CAUSAS  Estrs, ansiedad y depresin.  Malos hbitos para dormir.  Distracciones como mirar TV en la cama.  Siestas en horarios prximos a la hora de ir a dormir.  Involucrarse en conversaciones de gran carga emocional antes de ir a dormir.  Leer textos tcnicos antes de dormir.  Consumir alcohol y otros sedantes. Ellos pueden empeorar el problema. Pueden modificar los patrones de sueo normales y la normal [place].  Consumir estimulantes como cafena algunas horas antes de ir a dormir.  Sndromes dolorosos y las dificultades respiratorias pueden causar insomnio.  Realizar ejercicios a ltima hora de la noche.  El cambio en las zonas horarias puede causar trastornos del sueo (jet lag). En algunos casos se recomienda que otra persona observe sus patrones de sueo. Deben observar los perodos en los que no respira durante la noche (apnea del sueo). Tambin deben observar cunto tiempo duran esos perodos. Si vive slo o no tiene una persona de confianza que lo observe, podr concurrir a una clnica del sueo, en la que lo controlarn de [employer] profesional. La apnea del sueo requiere controles y Clinical research associate. Entregue su historia clnica al profesional que lo asiste Tambin infrmele las observaciones que sus familiares hayan hecho con respecto a sus  hbitos de sueo.  SNTOMAS  Sentir por la maana que no ha descansado lo suficiente.  Ansiedad y agitacin a la hora de dormir  Dificultad para dormirse o para Engineer, agricultural sueo. TRATAMIENTO  El [employer] indicar un tratamiento para los trastornos subyacentes. Tambin podr aconsejarlo o ayudarlo si usted toma alcohol o se automedica con otras drogas. El tratamiento de los problemas subyacentes generalmente eliminarn los problemas de insomnio.  Podrn prescribirle medicamentos para usar durante un plazo breve. Generalmente no se recomiendan para un uso prolongado.  Generalmente no se recomiendan los medicamentos de venta libre para un uso prolongado. Podran causarle adiccin.  Puede hacer ms fcil el conciliar el sueo si realiza modificaciones en su estilo de vida tales como:  Utilizar tcnicas de relajacin que favorecen la respiracin y reducen la tensin muscular.  No practicar actividad fsica en [place].  Modificar la dieta y el horario de la ltima comida. No tomar colaciones durante la noche  Trate de establecer una hora habitual para irse a dormir.  La psicoterapia puede ser de utilidad para los problemas que le ocasionan estrs y preocupaciones.  Si hay un ambiente ruidoso y no Water engineer, puede ser til Conservation officer, nature suave o sonidos agradables.  Suspenda los trabajos tediosos y [employer] al menos una hora antes de ir a dormir. INSTRUCCIONES PARA EL CUIDADO DOMICILIARIO  Lleve un diario. Infrmele al profesional que lo asiste sus progresos. Esto incluye todos los efectos secundarios de los medicamentos. Concurra regularmente a la Passenger transport manager con el profesional [place] nota de:  La hora en que se duerme.  Las [employer]  que permanece despierto durante la noche.  La calidad del sueo.  Cmo se siente al da siguiente. Esta informacin ayudar a que Buyer, retail.   Levntese de la cama si permanece despierto por  ms de 15 minutos. Lea o realice alguna actividad tranquila. [place]. Espere hasta que sienta sueo y luego vuelva a la cama.  Mantenga un ritmo constante de vigilia y sueo. Evite las siestas.  Practique actividad fsica con regularidad.  Evite las distracciones en el momento de ir a dormir. [place] distracciones se Clinical cytogeneticist ver televisin o Optometrist alguna actividad intensa o [place], hacer las cuentas de los gastos domsticos.  Programe un ritual para irse a dormir. Mantenga una rutina familiar relacionada con el bao diario, el cepillado de los [place], irse a la cama todas las noches a la misma hora, Pharmacist, community [place]. La rutina aumenta el xito de conciliar el sueo ms rpido.  Use tcnicas de relajacin. Practique rutinas que favorezcan la respiracin y alivien la tensin muscular. Tambin puede ayudarlo la visualizacin de escenas pacficas. Tambin puede tratar de [employer] pensamientos problemticos o molestos si mantiene la mente ocupada con pensamientos repetitivos o aburridos, como el antiguo consejo de Engineer, manufacturing systems. Tambin puede ser ms creativo, e imaginar que planta hermosas flores en su jardn, una detrs de la otra.  [place], trabaje para Special educational needs teacher. Cuando no es posible, algunas de las sugerencias ya presentadas lo ayudarn a reducir la ansiedad que acompaa las situaciones de estrs. EST SEGURO QUE:   Comprende las instrucciones para el alta mdica.  Controlar su enfermedad.  Solicitar atencin mdica de inmediato segn las indicaciones. Document Released: 07/29/2005 Document Revised: 10/21/2011 [facility] Patient Information 2014 [place], [place].

## 2013-12-06 NOTE — Progress Notes (Signed)
Pt is here to establish care. Pt has a history of diabetes. Pt states that for the past 7 days he has been having extreme pain in his penis.

## 2013-12-07 ENCOUNTER — Other Ambulatory Visit: Payer: Self-pay | Admitting: Internal Medicine

## 2013-12-07 DIAGNOSIS — R7989 Other specified abnormal findings of blood chemistry: Secondary | ICD-10-CM

## 2013-12-07 LAB — GC/CHLAMYDIA PROBE AMP, URINE
Chlamydia, Swab/Urine, PCR: NEGATIVE
GC Probe Amp, Urine: NEGATIVE

## 2013-12-07 LAB — TSH: TSH: 5.362 u[IU]/mL — ABNORMAL HIGH (ref 0.350–4.500)

## 2013-12-07 NOTE — Progress Notes (Signed)
Patient ID: Ryan Wells, male   DOB: Nov 10, 1955, 58 y.o.   MRN: [id number]   Ryan Wells, is a 58 y.o. male  [id number]  [id number]  DOB - [birth year]  CC: Establish care, penis pain, diabetes mellitus management      HPI: Ryan Wells is a 58 y.o. male here today to establish medical care. Patient reports that he was seen in [facility] for uncontrolled diabetes. Today he suffers of blurred vision, numbness, and fatigue.  He reports that he was given metformin 500 mg twice a day and Lantus 10 units at bedtime while at the hospital. He reports good medication compliance and some improvement in symptoms since beginning medication regimen. He also complains of a one-week history of penile pain. Patient reports that he is uncircumcised and the skin occasionally burns. Patient denies any sexual contact x1 year due to illness.  He denies swelling, discharge, lesions, or dysuria.  He states that the skin occasionally shrinks around the tip of his penis and closes shut. Patient reports insomnia for the past "couple of months".  He admits to increased stress and financial burden that may contribute to his lack of sleep. He reports only 3 hours of sleep nightly.  Patient has No headache, No chest pain, No abdominal pain - No Nausea, No Cough - SOB.  No Known Allergies Past Medical History  Diagnosis Date  . Diabetes mellitus without complication    Current Outpatient Prescriptions on File Prior to Visit  Medication Sig Dispense Refill  . acetaminophen (TYLENOL) 325 MG tablet Take 2 tablets (650 mg total) by mouth every 6 (six) hours as needed for mild pain (or Fever >/= 101).      Marland Kitchen aspirin EC 81 MG tablet Take 1 tablet (81 mg total) by mouth daily.      . insulin glargine (LANTUS) 100 UNIT/ML injection Inject 0.1 mLs (10 Units total) into the skin at bedtime.  1 vial  8  . levofloxacin (LEVAQUIN) 750 MG tablet Take 1 tablet (750 mg total) by mouth daily.  4 tablet  0  . metFORMIN  (GLUCOPHAGE) 500 MG tablet Take 1 tablet (500 mg total) by mouth 2 (two) times daily with a meal.  60 tablet  3  . metoprolol tartrate (LOPRESSOR) 12.5 mg TABS tablet Take 0.5 tablets (12.5 mg total) by mouth 2 (two) times daily.  60 tablet  3  . senna-docusate (SENOKOT-S) 8.6-50 MG per tablet Take 1 tablet by mouth 2 (two) times daily.      Marland Kitchen HYDROcodone-acetaminophen (NORCO/VICODIN) 5-325 MG per tablet Take 1 tablet by mouth daily as needed for moderate pain.       No current facility-administered medications on file prior to visit.   Family History  Problem Relation Age of Onset  . Diabetes Brother    History   Social History  . Marital Status: Single    Spouse Name: N/A    Number of Children: N/A  . Years of Education: N/A   Occupational History  . Not on file.   Social History Main Topics  . Smoking status: Never Smoker   . Smokeless tobacco: Not on file  . Alcohol Use: No  . Drug Use: No  . Sexual Activity: Not on file   Other Topics Concern  . Not on file   Social History Narrative  . No narrative on file    Review of Systems: Constitutional: Negative for fever, chills, diaphoresis, activity change, appetite change and fatigue. HENT: Negative for ear  pain, nosebleeds, congestion, facial swelling, rhinorrhea, neck pain, neck stiffness and ear discharge.  Eyes: Negative for pain, discharge, redness, itching. Positive blurred vision Respiratory: Negative for cough, choking, chest tightness, shortness of breath, wheezing and stridor.  Cardiovascular: Negative for chest pain, palpitations and leg swelling. Gastrointestinal: Negative for abdominal distention. Genitourinary: Negative for dysuria, urgency, frequency, hematuria, flank pain, decreased urine volume, difficulty urinating and dyspareunia. Positive pain on shaft of penis Musculoskeletal: Negative for back pain, joint swelling, arthralgia and gait problem. Neurological: Negative for dizziness, tremors, seizures,  syncope, facial asymmetry, speech difficulty, weakness, light-headedness, and headaches. Positive numbness of feet Hematological: Negative for adenopathy. Does not bruise/bleed easily. Psychiatric/Behavioral: Negative for hallucinations, behavioral problems, confusion, dysphoric mood, decreased concentration and agitation.  DIABETIC FOOT EXAM: Examination of the feet reveals normal posterior tibial and dorsalis pedis pulses. Skin to palpation and inspection is intact, without lesions or corns. No discoloration is present. Monofilament nylon test reveals normal sensation bilaterally over plantar surfaces of distal great toe, first, third, and fifth metatarsal heads. Brisk capillary refill    Objective:   Filed Vitals:   12/06/13 1514  BP: 100/69  Pulse: 83  Temp: 98.5 F (36.9 C)  Resp: 16    Physical Exam: Constitutional: Patient appears well-developed and well-nourished. No distress. HENT: Normocephalic, atraumatic, External right and left ear normal. Oropharynx is clear and moist.  Eyes: Conjunctivae and EOM are normal. PERRLA, no scleral icterus. Neck: Normal ROM. Neck supple. No JVD. No tracheal deviation. No thyromegaly. CVS: RRR, S1/S2 +, no murmurs, no gallops, no carotid bruit.  Pulmonary: Effort and breath sounds normal, no stridor, rhonchi, wheezes, rales.  Abdominal: Soft. BS +, no distension, tenderness, rebound or guarding.  Musculoskeletal: Normal range of motion. No edema and no tenderness.  Lymphadenopathy: No lymphadenopathy noted, cervical Neuro: Alert. Normal reflexes, muscle tone coordination. No cranial nerve deficit. Skin: Skin is warm and dry. No rash noted. Not diaphoretic. No erythema. No pallor. Psychiatric: Normal mood and affect. Behavior, judgment, thought content normal. Genital: normal penis and testes. No scrotal mass, tenderness, urethral discharge, or penile lesions. No varicocele, scrotal mass,scrotal tenderness, or swelling.   Lab Results    Component Value Date   WBC 10.8* 12/06/2013   HGB 15.0 12/06/2013   HCT 42.8 12/06/2013   MCV 82.9 12/06/2013   PLT 343 12/06/2013   Lab Results  Component Value Date   CREATININE 0.95 12/06/2013   BUN 20 12/06/2013   NA 138 12/06/2013   K 4.7 12/06/2013   CL 102 12/06/2013   CO2 27 12/06/2013    Lab Results  Component Value Date   HGBA1C 8.6* 11/27/2013   Lipid Panel     Component Value Date/Time   CHOL 174 11/27/2013 1709   TRIG 127 11/27/2013 1709   HDL 41 11/27/2013 1709   CHOLHDL 4.2 11/27/2013 1709   VLDL 25 11/27/2013 1709   LDLCALC 108* 11/27/2013 1709       Assessment and plan:   Ryan Wells was seen today for establish care.  Diagnoses and associated orders for this visit:  Diabetes - Glucose (CBG) - Ambulatory referral to Ophthalmology - CBC with Differential - TSH - COMPLETE METABOLIC PANEL WITH GFR - Lancets (FREESTYLE) lancets; Use as instructed - glucose blood (FREESTYLE LITE) test strip; Use as instructed  Penis pain - GC/chlamydia probe amp, urine - POCT urinalysis dipstick  Insomnia Patient education provided on good sleep hygiene. May use over-the-counter melatonin.   Patient will follow up in 3 months for repeat  hemoglobin A1c.    The patient was given clear instructions to go to ER or return to medical center if symptoms don't improve, worsen or new problems develop.   Due to language barrier, an interpreter was present during the history-taking and subsequent discussion (and for part of the physical exam) with this patient.   Chari Manning, NP-C [facility] [phone] 12/07/2013, 2:07 PM

## 2013-12-08 NOTE — Telephone Encounter (Signed)
Attempt #1-Placed call to patient to inform of lab results and to inform of need for additional thyroid labwork.  Unable to reach patient at home number; no voicemail available to leave message. Also placed call to cell number which is no longer in service.  Patient will need to be called again at a later time.

## 2013-12-13 ENCOUNTER — Telehealth: Payer: Self-pay | Admitting: Internal Medicine

## 2013-12-13 ENCOUNTER — Telehealth: Payer: Self-pay | Admitting: *Deleted

## 2013-12-13 ENCOUNTER — Other Ambulatory Visit: Payer: Self-pay | Admitting: Internal Medicine

## 2013-12-13 MED ORDER — INSULIN GLARGINE 100 UNIT/ML ~~LOC~~ SOLN: 10.0000 [IU] | Freq: Every day | SUBCUTANEOUS | Status: DC

## 2013-12-13 NOTE — Telephone Encounter (Signed)
Pt called and stated that he is still having pain on his genital and above the the genital. Please contact pt for further information

## 2013-12-13 NOTE — Telephone Encounter (Signed)
Patient came in regarding results from office visit on 12/06/13.Marland KitchenMarland KitchenPlease contact patient's son and give results. Patient has given verbal authorization.

## 2013-12-13 NOTE — Telephone Encounter (Signed)
Patient called to make an appointment to see PCP. Patient given phone number to appointment line. Alverda Skeans, RN

## 2013-12-14 ENCOUNTER — Telehealth: Payer: Self-pay | Admitting: Internal Medicine

## 2013-12-14 ENCOUNTER — Telehealth: Payer: Self-pay | Admitting: Emergency Medicine

## 2013-12-14 NOTE — Telephone Encounter (Signed)
Pt given lab results and negative std results. Scheduled pt for lab work visit with examination of penile pain with burning sensation.

## 2013-12-14 NOTE — Telephone Encounter (Signed)
Pt has come in today requesting to have his lab results read from visit on 4/27; please f/u with pt and or pt's son @ [phone]; pt has given verbal consent

## 2013-12-15 ENCOUNTER — Ambulatory Visit: Payer: Medicaid Other | Attending: Internal Medicine | Admitting: *Deleted

## 2013-12-15 ENCOUNTER — Ambulatory Visit: Payer: Medicaid Other | Attending: Internal Medicine

## 2013-12-15 VITALS — BP 106/71 | HR 77 | Temp 98.3°F | Resp 14 | Ht 62.0 in | Wt 126.0 lb

## 2013-12-15 DIAGNOSIS — R7989 Other specified abnormal findings of blood chemistry: Secondary | ICD-10-CM

## 2013-12-15 DIAGNOSIS — N4889 Other specified disorders of penis: Secondary | ICD-10-CM

## 2013-12-15 MED ORDER — TRAMADOL HCL 50 MG PO TABS: 50.0000 mg | ORAL_TABLET | Freq: Two times a day (BID) | ORAL | Status: DC

## 2013-12-15 NOTE — Patient Instructions (Signed)
Take medication as prescribed. Keep your genital area clean and dry. If you continue to have problems after 1-2 weeks please contact the clinic at [phone]

## 2013-12-15 NOTE — Progress Notes (Unsigned)
Patient here today for nurse visit. Patient complaining of pain in scrotum and head of penis turning white. Patient denies burning during urination. Visual assessment shows an uncircumcised male. Patient states the area turns white sometimes when the skin is over the penis. Patient states its painful when he pushes the skin back near the head of the penis. Consulted with Roney Jaffe, NP who prescribed Tramadol 50 mg twice a day and to instruct patient to keep penis clean and dry.

## 2013-12-16 LAB — T4, FREE: Free T4: 1.31 ng/dL (ref 0.80–1.80)

## 2013-12-16 LAB — T3, FREE: T3, Free: 3.3 pg/mL (ref 2.3–4.2)

## 2013-12-17 ENCOUNTER — Other Ambulatory Visit: Payer: Self-pay | Admitting: Internal Medicine

## 2013-12-17 ENCOUNTER — Ambulatory Visit: Payer: Self-pay

## 2013-12-17 DIAGNOSIS — R7989 Other specified abnormal findings of blood chemistry: Secondary | ICD-10-CM

## 2014-01-20 ENCOUNTER — Ambulatory Visit: Payer: Self-pay | Admitting: Internal Medicine

## 2014-03-07 ENCOUNTER — Ambulatory Visit: Payer: Self-pay | Admitting: Internal Medicine

## 2014-05-06 ENCOUNTER — Telehealth: Payer: Self-pay | Admitting: Internal Medicine

## 2014-05-06 ENCOUNTER — Other Ambulatory Visit: Payer: Self-pay

## 2014-05-06 ENCOUNTER — Ambulatory Visit: Payer: Medicaid Other | Attending: Internal Medicine

## 2014-05-06 MED ORDER — INSULIN GLARGINE 100 UNIT/ML ~~LOC~~ SOLN: 10.0000 [IU] | Freq: Every day | SUBCUTANEOUS | Status: DC

## 2014-05-06 MED ORDER — METFORMIN HCL 500 MG PO TABS: 500.0000 mg | ORAL_TABLET | Freq: Two times a day (BID) | ORAL | Status: DC

## 2014-05-06 NOTE — Telephone Encounter (Signed)
Patient requested medication refill for Metformin HCL 500 mg and Lantus 100 units. At this time Patient does not have any tablet; he is waiting at the [facility]. Please f/u with Patient.

## 2014-05-11 ENCOUNTER — Other Ambulatory Visit: Payer: Self-pay | Admitting: Internal Medicine

## 2014-05-11 MED ORDER — INSULIN GLARGINE 100 UNIT/ML SOLOSTAR PEN: 10.0000 [IU] | PEN_INJECTOR | Freq: Every day | SUBCUTANEOUS | Status: DC

## 2014-07-12 DIAGNOSIS — E039 Hypothyroidism, unspecified: Secondary | ICD-10-CM | POA: Insufficient documentation

## 2014-07-12 DIAGNOSIS — E038 Other specified hypothyroidism: Secondary | ICD-10-CM | POA: Insufficient documentation

## 2015-10-04 ENCOUNTER — Encounter: Payer: Self-pay | Admitting: *Deleted

## 2015-10-04 DIAGNOSIS — Z139 Encounter for screening, unspecified: Secondary | ICD-10-CM

## 2015-10-04 LAB — GLUCOSE, POCT (MANUAL RESULT ENTRY): POC Glucose: 389 mg/dl — AB (ref 70–99)

## 2015-10-04 NOTE — Congregational Nurse Program (Signed)
Congregational Nurse Program Note  Date of Encounter: 10/04/2015  Past Medical History: No past medical history on file.  Encounter Details:     CNP Questionnaire - 10/04/15 1230    Patient Demographics   Is this a new or existing patient? New   Patient is considered a/an Immigrant   Race Latino/Hispanic   Patient Assistance   Location of Patient [place]   Patient's financial/insurance status Low Income   Uninsured Patient Yes   Interventions Assisted patient in making appt.   Food insecurities addressed Not Applicable   Transportation assistance No   Assistance securing medications No   Educational health offerings Navigating the healthcare system   Encounter Details   Primary purpose of visit Navigating the Healthcare System;Other   Was an Emergency Department visit averted? Not Applicable   Does patient have a medical provider? No   Patient referred to [place];Other (comment)   Was a mental health screening completed? (GAINS tool) No   Does patient have dental issues? No   Does patient have vision issues? No   Since previous encounter, have you referred patient for abnormal blood pressure that resulted in a new diagnosis or medication change? No   Since previous encounter, have you referred patient for abnormal blood glucose that resulted in a new diagnosis or medication change? No       Patient came to center because he said he was not working and had no insurance. Social Worker assisted in finding help through her resources and with the [employer]. His Glucose check was 389 and that he was  On medications but was going to need further assistance. He left with help and very pleased with his visit . He will hopefully be following up so that he can get proper care.

## 2015-10-17 ENCOUNTER — Encounter: Payer: Self-pay | Admitting: *Deleted

## 2015-10-17 DIAGNOSIS — Z139 Encounter for screening, unspecified: Secondary | ICD-10-CM

## 2015-10-17 NOTE — Congregational Nurse Program (Signed)
Congregational Nurse Program Note  Date of Encounter: 10/17/2015  Past Medical History: No past medical history on file.  Encounter Details:     CNP Questionnaire - 10/17/15 1230    Patient Demographics   Is this a new or existing patient? New   Patient is considered a/an Immigrant   Race Latino/Hispanic   Patient Assistance   Location of Patient [place]   Patient's financial/insurance status Low Income   Uninsured Patient Yes   Interventions Averted from ED/Urgent Care   Patient referred to apply for the following financial assistance Not Applicable   Food insecurities addressed Not Applicable   Transportation assistance No   Assistance securing medications No   Educational health offerings Navigating the healthcare system;Other   Encounter Details   Primary purpose of visit Other   Was an Emergency Department visit averted? Not Applicable   Does patient have a medical provider? No   Patient referred to [place];Other (comment)   Was a mental health screening completed? (GAINS tool) No   Does patient have dental issues? No   Does patient have vision issues? No   Since previous encounter, have you referred patient for abnormal blood pressure that resulted in a new diagnosis or medication change? No   Since previous encounter, have you referred patient for abnormal blood glucose that resulted in a new diagnosis or medication change? No      Client back at center to day and was not happy today. When asked what was wrong he c/o not getting the help he needed. and  He showed me an old card from where he had been at [facility] .Client [employer] for food and to go to the [employer] for help because he states he had no insurance and money. He does not work and he was living with relatives. [place] Worker  Made appointments and I asked him did he go to the above places and he said he did not. C/o that they would not help. Explained to him that  he needed to keep appoints and to come back today at 1 pm so we could offer more assistance. He did no come back today. I got more information so we are hoping to get him in touch with Lavona Mound who speaks Spanish and works at [employer] for [employer] a message was left for her [phone]. Will follow up next week. [phone]    Client came back to center to ger information to help with his needs.Merleen Nicely White,MSW.MPH , [email or web address].i

## 2015-10-28 LAB — POCT LIPID PANEL
HDL: 43
LDL: 131
TC: 204
TRG: 179

## 2016-07-12 ENCOUNTER — Ambulatory Visit (INDEPENDENT_AMBULATORY_CARE_PROVIDER_SITE_OTHER): Payer: Self-pay | Admitting: Internal Medicine

## 2016-07-12 ENCOUNTER — Encounter: Payer: Self-pay | Admitting: Internal Medicine

## 2016-07-12 VITALS — BP 118/84 | HR 72 | Resp 12 | Ht 61.5 in | Wt 128.0 lb

## 2016-07-12 DIAGNOSIS — M25562 Pain in left knee: Secondary | ICD-10-CM

## 2016-07-12 DIAGNOSIS — E1165 Type 2 diabetes mellitus with hyperglycemia: Secondary | ICD-10-CM

## 2016-07-12 DIAGNOSIS — R946 Abnormal results of thyroid function studies: Secondary | ICD-10-CM

## 2016-07-12 DIAGNOSIS — R7989 Other specified abnormal findings of blood chemistry: Secondary | ICD-10-CM

## 2016-07-12 DIAGNOSIS — M2550 Pain in unspecified joint: Secondary | ICD-10-CM | POA: Insufficient documentation

## 2016-07-12 DIAGNOSIS — M25561 Pain in right knee: Secondary | ICD-10-CM

## 2016-07-12 DIAGNOSIS — B353 Tinea pedis: Secondary | ICD-10-CM

## 2016-07-12 DIAGNOSIS — Z79899 Other long term (current) drug therapy: Secondary | ICD-10-CM

## 2016-07-12 LAB — GLUCOSE, POCT (MANUAL RESULT ENTRY): POC Glucose: 503 mg/dl — AB (ref 70–99)

## 2016-07-12 MED ORDER — GLIPIZIDE 5 MG PO TABS: ORAL_TABLET | ORAL | 11 refills | Status: DC

## 2016-07-12 MED ORDER — MELOXICAM 7.5 MG PO TABS: 7.5000 mg | ORAL_TABLET | Freq: Every day | ORAL | 2 refills | Status: DC

## 2016-07-12 MED ORDER — METFORMIN HCL ER 500 MG PO TB24: ORAL_TABLET | ORAL | 11 refills | Status: DC

## 2016-07-12 MED ORDER — ASPIRIN EC 81 MG PO TBEC
81.0000 mg | DELAYED_RELEASE_TABLET | Freq: Every day | ORAL | Status: DC
Start: 2016-07-12 — End: 2018-06-29
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
## 2016-07-12 NOTE — Patient Instructions (Addendum)
Terbinafine 1 % dos veces al dia --nariz y pies necesita tarjeta naranja

## 2016-07-14 LAB — CBC WITH DIFFERENTIAL/PLATELET
Basophils Absolute: 0.1 10*3/uL (ref 0.0–0.2)
Basos: 1 %
EOS (ABSOLUTE): 0.1 10*3/uL (ref 0.0–0.4)
Eos: 1 %
Hematocrit: 40.9 % (ref 37.5–51.0)
Hemoglobin: 13.9 g/dL (ref 12.6–17.7)
Immature Grans (Abs): 0 10*3/uL (ref 0.0–0.1)
Immature Granulocytes: 0 %
Lymphocytes Absolute: 2 10*3/uL (ref 0.7–3.1)
Lymphs: 29 %
MCH: 29.6 pg (ref 26.6–33.0)
MCHC: 34 g/dL (ref 31.5–35.7)
MCV: 87 fL (ref 79–97)
Monocytes Absolute: 0.3 10*3/uL (ref 0.1–0.9)
Monocytes: 4 %
Neutrophils Absolute: 4.5 10*3/uL (ref 1.4–7.0)
Neutrophils: 65 %
Platelets: 189 10*3/uL (ref 150–379)
RBC: 4.69 x10E6/uL (ref 4.14–5.80)
RDW: 13.2 % (ref 12.3–15.4)
WBC: 6.9 10*3/uL (ref 3.4–10.8)

## 2016-07-14 LAB — COMPREHENSIVE METABOLIC PANEL
ALT: 11 IU/L (ref 0–44)
AST: 10 IU/L (ref 0–40)
Albumin/Globulin Ratio: 1.1 — ABNORMAL LOW (ref 1.2–2.2)
Albumin: 3.5 g/dL — ABNORMAL LOW (ref 3.6–4.8)
Alkaline Phosphatase: 158 IU/L — ABNORMAL HIGH (ref 39–117)
BUN/Creatinine Ratio: 21 (ref 10–24)
BUN: 27 mg/dL (ref 8–27)
Bilirubin Total: 0.5 mg/dL (ref 0.0–1.2)
CO2: 26 mmol/L (ref 18–29)
Calcium: 9.2 mg/dL (ref 8.6–10.2)
Chloride: 91 mmol/L — ABNORMAL LOW (ref 96–106)
Creatinine, Ser: 1.28 mg/dL — ABNORMAL HIGH (ref 0.76–1.27)
GFR calc Af Amer: 69 mL/min/{1.73_m2} (ref >59)
GFR calc non Af Amer: 60 mL/min/{1.73_m2} (ref >59)
Globulin, Total: 3.1 g/dL (ref 1.5–4.5)
Glucose: 472 mg/dL — ABNORMAL HIGH (ref 65–99)
Potassium: 5.1 mmol/L (ref 3.5–5.2)
Sodium: 132 mmol/L — ABNORMAL LOW (ref 134–144)
Total Protein: 6.6 g/dL (ref 6.0–8.5)

## 2016-07-14 LAB — TSH: TSH: 5.59 u[IU]/mL — ABNORMAL HIGH (ref 0.450–4.500)

## 2016-07-14 LAB — MICROALBUMIN / CREATININE URINE RATIO
Creatinine, Urine: 44.1 mg/dL
Microalb/Creat Ratio: 2946.9 mg/g creat — ABNORMAL HIGH (ref 0.0–30.0)
Microalbumin, Urine: 1299.6 ug/mL

## 2016-07-14 LAB — LIPID PANEL W/O CHOL/HDL RATIO
Cholesterol, Total: 248 mg/dL — ABNORMAL HIGH (ref 100–199)
HDL: 54 mg/dL (ref >39)
LDL Calculated: 158 mg/dL — ABNORMAL HIGH (ref 0–99)
Triglycerides: 180 mg/dL — ABNORMAL HIGH (ref 0–149)
VLDL Cholesterol Cal: 36 mg/dL (ref 5–40)

## 2016-07-14 LAB — HGB A1C W/O EAG: Hgb A1c MFr Bld: >15.5 % — ABNORMAL HIGH (ref 4.8–5.6)

## 2016-07-15 ENCOUNTER — Telehealth: Payer: Self-pay | Admitting: Licensed Clinical Social Worker

## 2016-07-15 NOTE — Telephone Encounter (Signed)
LCSW called new pt to introduce social work services at the clinic, but no answer. Was not able to leave message.

## 2016-07-17 MED ORDER — LISINOPRIL 5 MG PO TABS: ORAL_TABLET | ORAL | 11 refills | Status: DC

## 2016-07-17 MED ORDER — LEVOTHYROXINE SODIUM 25 MCG PO CAPS: 25.0000 ug | ORAL_CAPSULE | Freq: Every day | ORAL | 11 refills | Status: DC

## 2016-07-29 ENCOUNTER — Other Ambulatory Visit (INDEPENDENT_AMBULATORY_CARE_PROVIDER_SITE_OTHER): Payer: Self-pay

## 2016-07-29 VITALS — BP 122/82 | HR 70

## 2016-07-29 DIAGNOSIS — Z79899 Other long term (current) drug therapy: Secondary | ICD-10-CM | POA: Insufficient documentation

## 2016-07-30 LAB — BASIC METABOLIC PANEL
BUN/Creatinine Ratio: 13 (ref 10–24)
BUN: 14 mg/dL (ref 8–27)
CO2: 26 mmol/L (ref 18–29)
Calcium: 8.7 mg/dL (ref 8.6–10.2)
Chloride: 104 mmol/L (ref 96–106)
Creatinine, Ser: 1.04 mg/dL (ref 0.76–1.27)
GFR calc Af Amer: 89 mL/min/{1.73_m2} (ref >59)
GFR calc non Af Amer: 77 mL/min/{1.73_m2} (ref >59)
Glucose: 129 mg/dL — ABNORMAL HIGH (ref 65–99)
Potassium: 4.7 mmol/L (ref 3.5–5.2)
Sodium: 142 mmol/L (ref 134–144)

## 2016-08-14 NOTE — Progress Notes (Signed)
Called patient and no answer and voicemail was not set up.

## 2016-08-16 NOTE — Progress Notes (Signed)
Ryan Wells called patient and read lab results and were understood by patient. Reminded him of his next appointment on September 02, 2016.

## 2016-09-02 ENCOUNTER — Other Ambulatory Visit (INDEPENDENT_AMBULATORY_CARE_PROVIDER_SITE_OTHER): Payer: Self-pay

## 2016-09-02 DIAGNOSIS — E039 Hypothyroidism, unspecified: Secondary | ICD-10-CM

## 2016-09-03 LAB — TSH: TSH: 10.94 u[IU]/mL — ABNORMAL HIGH (ref 0.450–4.500)

## 2016-09-19 NOTE — Progress Notes (Signed)
Patient has been called and given his lab result. Has not been taking levothyroxine for about two weeks now because he says he does not have the money. He will refill the prescription some time in the next couple of days.

## 2016-10-31 ENCOUNTER — Telehealth: Payer: Self-pay | Admitting: Internal Medicine

## 2016-10-31 NOTE — Telephone Encounter (Signed)
[name] spoke with patient and informed patient Rx's are at [facility] on [address]. Patient understood.

## 2016-11-13 ENCOUNTER — Ambulatory Visit (HOSPITAL_COMMUNITY)
Admission: EM | Admit: 2016-11-13 | Discharge: 2016-11-13 | Disposition: A | Discharge status: Home / Self Care | Payer: Self-pay

## 2016-11-13 NOTE — Congregational Nurse Program (Signed)
Congregational Nurse Program Note  Date of Encounter: 11/13/2016  Past Medical History: No past medical history on file.  Encounter Details:     CNP Questionnaire - 11/13/16 1028      Patient Demographics   Is this a new or existing patient? Existing   Patient is considered a/an Immigrant   Race Latino/Hispanic     Patient Assistance   Location of Patient Assistance Not Applicable   Patient's financial/insurance status Low Income   Uninsured Patient (Orange Card/Care Connects) Yes   Interventions Referred to ED/Urgent Care   Patient referred to apply for the following financial assistance Medicaid   Food insecurities addressed Not Applicable   Transportation assistance No   Assistance securing medications No   Educational health offerings Nutrition;Diabetes;Navigating the healthcare system     Encounter Details   Primary purpose of visit Acute Illness/Condition Visit;Other   Was an Emergency Department visit averted? No   Does patient have a medical provider? Yes   Patient referred to Urgent Care   Was a mental health screening completed? (GAINS tool) No   Does patient have dental issues? Yes   Was a dental referral made? No   Does patient have vision issues? Yes   Was a vision referral made? No   Does your patient have an abnormal blood pressure today? No   Since previous encounter, have you referred patient for abnormal blood pressure that resulted in a new diagnosis or medication change? No   Does your patient have an abnormal blood glucose today? Yes   Since previous encounter, have you referred patient for abnormal blood glucose that resulted in a new diagnosis or medication change? No   Was there a life-saving intervention made? Yes     Office visit at [employer] for this client requesting reading glasses for classroom. Communicated well in [place] and speaks Spanish also. Immigrant from [place] attending English classes at school. Complains of vision problem  today. Later admits being diabetic and unable to purchase medications since two months. Applied and eligible for Medicaid. Waiting for issuance of coverage. Denies symptoms of dizziness, headache, nausea, thirsty,or vomiting today. Alert and responsive to interview appropriately. CBG 544 fasting at 9:55 am. Stressed importance of managing diabetes and medication; complications of disease. Referred to [facility] immediately. Follow-up and return to nurse and social worker's office 11-19-16. Jannetta Quint, RN/CN.

## 2016-11-28 ENCOUNTER — Ambulatory Visit (INDEPENDENT_AMBULATORY_CARE_PROVIDER_SITE_OTHER): Payer: Self-pay | Admitting: Internal Medicine

## 2016-11-28 ENCOUNTER — Other Ambulatory Visit: Payer: Self-pay

## 2016-11-28 VITALS — BP 124/82 | HR 70 | Temp 97.7°F | Resp 12 | Ht 61.5 in | Wt 135.0 lb

## 2016-11-28 DIAGNOSIS — E1142 Type 2 diabetes mellitus with diabetic polyneuropathy: Secondary | ICD-10-CM

## 2016-11-28 DIAGNOSIS — R809 Proteinuria, unspecified: Secondary | ICD-10-CM

## 2016-11-28 DIAGNOSIS — H40053 Ocular hypertension, bilateral: Secondary | ICD-10-CM

## 2016-11-28 DIAGNOSIS — IMO0002 Reserved for concepts with insufficient information to code with codable children: Secondary | ICD-10-CM

## 2016-11-28 DIAGNOSIS — E1165 Type 2 diabetes mellitus with hyperglycemia: Secondary | ICD-10-CM

## 2016-11-28 DIAGNOSIS — R112 Nausea with vomiting, unspecified: Secondary | ICD-10-CM

## 2016-11-28 DIAGNOSIS — H269 Unspecified cataract: Secondary | ICD-10-CM

## 2016-11-28 DIAGNOSIS — E039 Hypothyroidism, unspecified: Secondary | ICD-10-CM

## 2016-11-28 DIAGNOSIS — R0789 Other chest pain: Secondary | ICD-10-CM

## 2016-11-28 LAB — POCT URINALYSIS DIPSTICK
Bilirubin, UA: NEGATIVE
Glucose, UA: 100
Ketones, UA: NEGATIVE
Nitrite, UA: NEGATIVE
Protein, UA: 2
Spec Grav, UA: 1.015 (ref 1.010–1.025)
Urobilinogen, UA: 0.2 E.U./dL
pH, UA: 6 (ref 5.0–8.0)

## 2016-11-29 LAB — CBC WITH DIFFERENTIAL/PLATELET
Basophils Absolute: 0 10*3/uL (ref 0.0–0.2)
Basos: 0 %
EOS (ABSOLUTE): 0 10*3/uL (ref 0.0–0.4)
Eos: 0 %
Hematocrit: 38.4 % (ref 37.5–51.0)
Hemoglobin: 13.3 g/dL (ref 13.0–17.7)
Immature Grans (Abs): 0 10*3/uL (ref 0.0–0.1)
Immature Granulocytes: 0 %
Lymphocytes Absolute: 1.5 10*3/uL (ref 0.7–3.1)
Lymphs: 18 %
MCH: 29.6 pg (ref 26.6–33.0)
MCHC: 34.6 g/dL (ref 31.5–35.7)
MCV: 85 fL (ref 79–97)
Monocytes Absolute: 0.4 10*3/uL (ref 0.1–0.9)
Monocytes: 5 %
Neutrophils Absolute: 6.5 10*3/uL (ref 1.4–7.0)
Neutrophils: 77 %
Platelets: 230 10*3/uL (ref 150–379)
RBC: 4.5 x10E6/uL (ref 4.14–5.80)
RDW: 12.9 % (ref 12.3–15.4)
WBC: 8.5 10*3/uL (ref 3.4–10.8)

## 2016-11-29 LAB — COMPREHENSIVE METABOLIC PANEL
ALT: 10 IU/L (ref 0–44)
AST: 19 IU/L (ref 0–40)
Albumin/Globulin Ratio: 1.4 (ref 1.2–2.2)
Albumin: 3.8 g/dL (ref 3.6–4.8)
Alkaline Phosphatase: 108 IU/L (ref 39–117)
BUN/Creatinine Ratio: 17 (ref 10–24)
BUN: 21 mg/dL (ref 8–27)
Bilirubin Total: 0.5 mg/dL (ref 0.0–1.2)
CO2: 25 mmol/L (ref 18–29)
Calcium: 9.3 mg/dL (ref 8.6–10.2)
Chloride: 99 mmol/L (ref 96–106)
Creatinine, Ser: 1.22 mg/dL (ref 0.76–1.27)
GFR calc Af Amer: 73 mL/min/{1.73_m2} (ref >59)
GFR calc non Af Amer: 63 mL/min/{1.73_m2} (ref >59)
Globulin, Total: 2.8 g/dL (ref 1.5–4.5)
Glucose: 200 mg/dL — ABNORMAL HIGH (ref 65–99)
Potassium: 4.8 mmol/L (ref 3.5–5.2)
Sodium: 139 mmol/L (ref 134–144)
Total Protein: 6.6 g/dL (ref 6.0–8.5)

## 2016-11-29 LAB — HGB A1C W/O EAG: Hgb A1c MFr Bld: 14.4 % — ABNORMAL HIGH (ref 4.8–5.6)

## 2016-12-11 ENCOUNTER — Ambulatory Visit (INDEPENDENT_AMBULATORY_CARE_PROVIDER_SITE_OTHER): Payer: Self-pay | Admitting: Internal Medicine

## 2016-12-11 ENCOUNTER — Encounter: Payer: Self-pay | Admitting: Internal Medicine

## 2016-12-11 VITALS — BP 120/70 | HR 72 | Resp 12 | Ht 61.5 in | Wt 132.0 lb

## 2016-12-11 DIAGNOSIS — E1142 Type 2 diabetes mellitus with diabetic polyneuropathy: Secondary | ICD-10-CM

## 2016-12-11 DIAGNOSIS — H4901 Third [oculomotor] nerve palsy, right eye: Secondary | ICD-10-CM

## 2016-12-11 DIAGNOSIS — E1165 Type 2 diabetes mellitus with hyperglycemia: Secondary | ICD-10-CM

## 2016-12-11 DIAGNOSIS — IMO0001 Reserved for inherently not codable concepts without codable children: Secondary | ICD-10-CM

## 2016-12-11 DIAGNOSIS — K3184 Gastroparesis: Secondary | ICD-10-CM

## 2016-12-11 DIAGNOSIS — Z91199 Patient's noncompliance with other medical treatment and regimen due to unspecified reason: Secondary | ICD-10-CM

## 2016-12-11 DIAGNOSIS — Z9119 Patient's noncompliance with other medical treatment and regimen: Secondary | ICD-10-CM

## 2016-12-11 LAB — GLUCOSE, POCT (MANUAL RESULT ENTRY): POC Glucose: 329 mg/dl — AB (ref 70–99)

## 2016-12-11 MED ORDER — LEVOTHYROXINE SODIUM 25 MCG PO TABS: 25.0000 ug | ORAL_TABLET | Freq: Every day | ORAL | 11 refills | Status: DC

## 2016-12-11 MED ORDER — METOCLOPRAMIDE HCL 10 MG PO TABS: ORAL_TABLET | ORAL | 11 refills | Status: DC

## 2016-12-11 NOTE — Progress Notes (Signed)
Subjective:    Patient ID: Ryan Wells, male    DOB: 27-Feb-1956, 61 y.o.   MRN: [id number]  HPI   Here today in follow up  Did not bring any of his meds.  Not clear what he is doing with meds.  1.  Nausea and vomitiing:  States he is taking the Metoclopramide 5 mg before meals and at bedtime.  States he has continued to vomit until today.  Not clear he is always taking the medication before meals.  2.  DM:  States he is taking Metformin twice daily along with the Glipizide. Not checking sugars as he has not obtained the orange card as of yet.  States he is taking his Lisinopril 5 mg daily and has not missed as well.  Discussed A1C checked 2 weeks ago was 14.4%, as expected would be high.    3.  Right Eye concerns:  1 week ago, developed right frontal headache pain as well as pain in right maxillary area.   States the pain has been fairly constant, save for a couple hours in the morning after waking.  States the pain is very severe.  Pain has not really dissipated in the past week.   Three days ago, developed inability to open eye.  Not clear if he is able to look to the left/medially with his right eye.   Current Meds  Medication Sig  . glipiZIDE (GLUCOTROL) 5 MG tablet 1 tab by mouth twice daily with meals  . lisinopril (PRINIVIL,ZESTRIL) 5 MG tablet 1/2 tab by mouth daily  . meloxicam (MOBIC) 7.5 MG tablet Take 1 tablet (7.5 mg total) by mouth daily.  . metFORMIN (GLUCOPHAGE-XR) 500 MG 24 hr tablet 2 tabs by mouth in morning with breakfast  . metoCLOPramide (REGLAN) 10 MG tablet Take 10 mg by mouth 4 (four) times daily -  before meals and at bedtime. To take 1/2 tab  . [DISCONTINUED] Levothyroxine Sodium 25 MCG CAPS Take 1 capsule (25 mcg total) by mouth daily before breakfast.    No Known Allergies    Review of Systems     Objective:   Physical Exam   NAD HEENT: Right eye with complete ptosis, unable to look up or down well, unable to look nasally.  Pupil is  equal and reactive to light.  Throat without injection, TMs pearly gray Neck:  Supple, No adenopathy Chest:  CTA CV:  RRR without murmur or rub,S3, or S4.  Carotids without bruits.  Carotid, radial and DP pulses normal and equal. Abd:  S, NT, No HSM or mass, + BS Neuro:  A & O x 3, other than right eye findings, CN II-XII grossly intact, Motor 5/5, See simple foot exam.  Gait stable with cane.  Diabetic Foot Exam - Simple   Simple Foot Form Diabetic Foot exam was performed with the following findings:  Yes 12/11/2016  3:30 PM  Visual Inspection See comments:  Yes Sensation Testing See comments:  Yes Pulse Check Posterior Tibialis and Dorsalis pulse intact bilaterally:  Yes Comments Decreased sensation to 10 g monofilament to bilateral feet, left worse than right. Examination is not consistent, however.  Difficult to tell exactly what he is sensing.         Assessment & Plan:  1.  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  Has had poorly controlled DM for some time.  No other neurologic concerns that this is from other etiology. He does not appear acutely in pain with the headache he  describes.  He is however, to go to the ED if a headache redevelops.   Patch the right eye to avoid double vision and protect the eye.  2.  DM:  Long discussion again with patient and later wife regarding his care.  Very dysfunctional family.  Not clear his wife or son will be willing to help him based upon what sounds like poor treatment in the past by patient.  He is to take his Glipizide and Metformin twice daily with meals--not well after eating..  3.  Nausea and Vomiting:  To take the Metoclopramide before each meal and at bedtime, not after the meal and only twice daily as he has been doing.  If unable  to keep fluids down, will need to go to ED for IV hydration.  4.  Hypothyroidism:  Found out had not been filling as his prescription for levothyroxine was for capsules, which are much more expensive.   Changed to tabs.

## 2016-12-11 NOTE — Patient Instructions (Signed)
Patch right eye. Go to ED if you develop a really bad headache or weakness somewhere else on your body.
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
## 2016-12-19 ENCOUNTER — Ambulatory Visit (INDEPENDENT_AMBULATORY_CARE_PROVIDER_SITE_OTHER): Payer: Self-pay | Admitting: Internal Medicine

## 2016-12-19 ENCOUNTER — Encounter: Payer: Self-pay | Admitting: Internal Medicine

## 2016-12-19 VITALS — BP 130/88 | HR 72 | Resp 14 | Ht 61.0 in | Wt 128.0 lb

## 2016-12-19 DIAGNOSIS — IMO0001 Reserved for inherently not codable concepts without codable children: Secondary | ICD-10-CM

## 2016-12-19 DIAGNOSIS — E1165 Type 2 diabetes mellitus with hyperglycemia: Secondary | ICD-10-CM

## 2016-12-19 DIAGNOSIS — R112 Nausea with vomiting, unspecified: Secondary | ICD-10-CM

## 2016-12-19 DIAGNOSIS — H4901 Third [oculomotor] nerve palsy, right eye: Secondary | ICD-10-CM

## 2016-12-19 LAB — POCT URINALYSIS DIPSTICK
Bilirubin, UA: NEGATIVE
Blood, UA: 50
Glucose, UA: NEGATIVE
Ketones, UA: NEGATIVE
Leukocytes, UA: NEGATIVE
Nitrite, UA: NEGATIVE
Protein, UA: 2000
Spec Grav, UA: 1.01 (ref 1.010–1.025)
Urobilinogen, UA: 0.2 E.U./dL
pH, UA: 6 (ref 5.0–8.0)

## 2016-12-19 LAB — GLUCOSE, POCT (MANUAL RESULT ENTRY): POC Glucose: 188 mg/dl — AB (ref 70–99)

## 2016-12-19 NOTE — Progress Notes (Signed)
   Subjective:    Patient ID: Ryan Wells, male    DOB: [birth year], 61 y.o.   MRN: [id number]  HPI   Here for 1 week follow up  1.  Nausea and vomiting:  Stopped vomiting after last visit until this morning.  Has vomited 3 times since 2 a.m. Today.  Last was about 12:15 p.m.   Headache has recurred with the vomiting.    2.  Right eye ptosis: pupil last visit was unaffected and felt to have a third nerve palsy.  No other findings on exam.  He has been using a patch for the eye.  He left the patch at home.  3.  DM:  Up until this morning, had been taking his medications regularly.  Still does not have an orange card, so cannot get test strips or glucometer. Sugar today at 188 is decreased, but not keeping anything down.  Had tea without sugar about 10 am.  This has been his only intake today. Has only been taking Metoclopramide twice daily in the morning and in the afternoon, though is finally taking before his meal. States until this morning, he was starting to feel well.  Current Meds  Medication Sig  . glipiZIDE (GLUCOTROL) 5 MG tablet 1 tab by mouth twice daily with meals  . levothyroxine (SYNTHROID, LEVOTHROID) 25 MCG tablet Take 1 tablet (25 mcg total) by mouth daily before breakfast.  . lisinopril (PRINIVIL,ZESTRIL) 5 MG tablet 1/2 tab by mouth daily  . meloxicam (MOBIC) 7.5 MG tablet Take 1 tablet (7.5 mg total) by mouth daily.  . metFORMIN (GLUCOPHAGE-XR) 500 MG 24 hr tablet 2 tabs by mouth in morning with breakfast  . metoCLOPramide (REGLAN) 10 MG tablet 1/2 tab by mouth 4 times daily before meals and at bedtime    No Known Allergies       Review of Systems   No fever or chills.  No dysuria,  Urinary frequency.  No diarrhea.  No cough, sore throat or ear pain. No dyspnea.  No abdominal pain. Food he brings up with vomiting is undigested food, which is from yesterday's meals.      Objective:   Physical Exam NAD HEENT:  Right ptosis, pupil equal and reactive to  light.  Unable to look nasally with right eye, rest of EOMI.  TMs pearly gray, throat without injection.  MMM Neck:  Supple, no adenopathy Chest:  CTA CV:  RRR without murmur or rub, radial pulses normal and equal Abd:  S, NT, No HSM or mass, + BS, no flank tenderness, no suprapubic tenderness. LE:  No edema Neuro:  A & O x 3, CN 2-12 other than right eye findings, grossly intact, Motor 5/5 DTRs 2+/4, gait normal, coordination normal       Assessment & Plan:  1.  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  No progression or improvement at this point.  Follow closely.  Discussed not clear if eye symptoms will resolve.  2.  DM:  Need to get Nausea and vomiting under control to improve this as well.  3.  Nausea and vomiting:  No source of infection, though urine with small amount of blood--send for culture to be certain does not have UTI presenting in this manner.  Check CBC, CMP, lipase as well.  Continue to treat with Metoclopramide for likely element of gastroparesis.

## 2016-12-20 LAB — CBC WITH DIFFERENTIAL/PLATELET
Basophils Absolute: 0 10*3/uL (ref 0.0–0.2)
Basos: 0 %
EOS (ABSOLUTE): 0 10*3/uL (ref 0.0–0.4)
Eos: 0 %
Hematocrit: 42.8 % (ref 37.5–51.0)
Hemoglobin: 14.4 g/dL (ref 13.0–17.7)
Immature Grans (Abs): 0 10*3/uL (ref 0.0–0.1)
Immature Granulocytes: 0 %
Lymphocytes Absolute: 1.9 10*3/uL (ref 0.7–3.1)
Lymphs: 23 %
MCH: 29.6 pg (ref 26.6–33.0)
MCHC: 33.6 g/dL (ref 31.5–35.7)
MCV: 88 fL (ref 79–97)
Monocytes Absolute: 0.3 10*3/uL (ref 0.1–0.9)
Monocytes: 3 %
Neutrophils Absolute: 5.9 10*3/uL (ref 1.4–7.0)
Neutrophils: 74 %
Platelets: 258 10*3/uL (ref 150–379)
RBC: 4.86 x10E6/uL (ref 4.14–5.80)
RDW: 14 % (ref 12.3–15.4)
WBC: 8.1 10*3/uL (ref 3.4–10.8)

## 2016-12-20 LAB — SPECIMEN STATUS REPORT

## 2016-12-20 LAB — COMPREHENSIVE METABOLIC PANEL
ALT: 13 IU/L (ref 0–44)
AST: 19 IU/L (ref 0–40)
Albumin/Globulin Ratio: 1.3 (ref 1.2–2.2)
Albumin: 3.7 g/dL (ref 3.6–4.8)
Alkaline Phosphatase: 109 IU/L (ref 39–117)
BUN/Creatinine Ratio: 21 (ref 10–24)
BUN: 25 mg/dL (ref 8–27)
Bilirubin Total: 0.5 mg/dL (ref 0.0–1.2)
CO2: 26 mmol/L (ref 18–29)
Calcium: 9.1 mg/dL (ref 8.6–10.2)
Chloride: 96 mmol/L (ref 96–106)
Creatinine, Ser: 1.19 mg/dL (ref 0.76–1.27)
GFR calc Af Amer: 75 mL/min/{1.73_m2} (ref >59)
GFR calc non Af Amer: 65 mL/min/{1.73_m2} (ref >59)
Globulin, Total: 2.8 g/dL (ref 1.5–4.5)
Glucose: 189 mg/dL — ABNORMAL HIGH (ref 65–99)
Potassium: 4.7 mmol/L (ref 3.5–5.2)
Sodium: 140 mmol/L (ref 134–144)
Total Protein: 6.5 g/dL (ref 6.0–8.5)

## 2016-12-20 LAB — LIPASE: Lipase: 28 U/L (ref 13–78)

## 2016-12-24 LAB — URINE CULTURE

## 2016-12-24 LAB — SPECIMEN STATUS REPORT

## 2016-12-29 MED ORDER — CIPROFLOXACIN HCL 500 MG PO TABS: 500.0000 mg | ORAL_TABLET | Freq: Two times a day (BID) | ORAL | 0 refills | Status: DC

## 2017-01-02 ENCOUNTER — Ambulatory Visit: Payer: Self-pay | Admitting: Internal Medicine

## 2017-01-03 ENCOUNTER — Encounter: Payer: Self-pay | Admitting: Internal Medicine

## 2017-01-03 ENCOUNTER — Ambulatory Visit (INDEPENDENT_AMBULATORY_CARE_PROVIDER_SITE_OTHER): Payer: Self-pay | Admitting: Internal Medicine

## 2017-01-03 VITALS — BP 122/78 | HR 76 | Resp 12 | Ht 61.0 in | Wt 132.0 lb

## 2017-01-03 DIAGNOSIS — R7989 Other specified abnormal findings of blood chemistry: Secondary | ICD-10-CM

## 2017-01-03 DIAGNOSIS — E1165 Type 2 diabetes mellitus with hyperglycemia: Secondary | ICD-10-CM

## 2017-01-03 DIAGNOSIS — R946 Abnormal results of thyroid function studies: Secondary | ICD-10-CM

## 2017-01-03 DIAGNOSIS — K3184 Gastroparesis: Secondary | ICD-10-CM

## 2017-01-03 DIAGNOSIS — E1142 Type 2 diabetes mellitus with diabetic polyneuropathy: Secondary | ICD-10-CM

## 2017-01-03 DIAGNOSIS — H4901 Third [oculomotor] nerve palsy, right eye: Secondary | ICD-10-CM

## 2017-01-03 DIAGNOSIS — IMO0002 Reserved for concepts with insufficient information to code with codable children: Secondary | ICD-10-CM

## 2017-01-03 DIAGNOSIS — E1143 Type 2 diabetes mellitus with diabetic autonomic (poly)neuropathy: Secondary | ICD-10-CM

## 2017-01-03 HISTORY — DX: Third (oculomotor) nerve palsy, right eye: H49.01

## 2017-01-03 NOTE — Progress Notes (Signed)
   Subjective:    Patient ID: Ryan Wells, male    DOB: [birth year], 61 y.o.   MRN: [id number]  HPI   1. Klebsiella UTI:  Delay in getting antibiotic, Cipro,  started for several reasons.  Started 2 days ago, but thinks he forgot this morning's dose as he was nauseated.   Patient was previously without symptoms regarding the UTI, but as noted he is now urinating less and not getting up at night to urinate as much.  Had denied these as problems previously. No dysuria. Vomiting stopped 6 days ago. He continues with nausea, but better since starting the Cipro.  Is eating more as well.   2.  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  3.  DM:  Has no idea what his sugars are running. He feels his sugars are much better as:   Numbness and tingling in hands and feet is no longer a problem.   Is not thirsty as he was before and urine output has also decreased.  Sugar today was 165 He does not have an orange card still as he has not brought in his paperwork.  4.  Right 3rd Nerve Palsy:  Eyelid still with ptosis, but minimal ability to open now.  Feels he can look to his left also when he holds the eyelid open.  5.  Hypothyroidism:  Still has not picked up thyroid hormone.  Previously went through as a capsule, which was very expensive.           Current Meds  Medication Sig  . ciprofloxacin (CIPRO) 500 MG tablet Take 1 tablet (500 mg total) by mouth 2 (two) times daily.  Marland Kitchen glipiZIDE (GLUCOTROL) 5 MG tablet 1 tab by mouth twice daily with meals  . lisinopril (PRINIVIL,ZESTRIL) 5 MG tablet 1/2 tab by mouth daily  . metFORMIN (GLUCOPHAGE-XR) 500 MG 24 hr tablet 2 tabs by mouth in morning with breakfast  . metoCLOPramide (REGLAN) 10 MG tablet 1/2 tab by mouth 4 times daily before meals and at bedtime    No Known Allergies   Review of Systems     Objective:   Physical Exam NAD. Looks much better HEENT: PERRL, still unable  to look nasally for most part with right eye, but minimal movement in that direction.  Able to slightly elevate right upper eyelid today as well. Throat without injection, MMM Neck:  Supple, No adenopathy Chest:  CTA CV:  RRR without murmur or rub, no carotid bruits, carotid, radial pulses normal and equal Abd:  S, NT, No HSM or mass, + BS LE:  No edema       Assessment & Plan:  1.  UTI:  Likely large element of nausea and vomiting as that has improved immensely since beginning of treatment.  2.  Gastroparesis:  Also likely element of prolonged nausea and vomiting.  Continue Metoclopramide.  3.  DM:  Needs orange card so we can get a better idea of what his sugars are running in next 3 months --needs strips and glucometer.  Urged him to get this done. Encouraged to get restarted on baby aspirin daily with a meal as he has not been taking.  4.  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  5.  Hypothyroidism:  Discussed he needs to get restarted on thyroid hormone.  Discussed DM would likely not be well controlled if not adequately supplemented with thyroid hormone.

## 2017-01-19 ENCOUNTER — Encounter: Payer: Self-pay | Admitting: Internal Medicine

## 2017-01-19 DIAGNOSIS — R809 Proteinuria, unspecified: Secondary | ICD-10-CM

## 2017-01-19 HISTORY — DX: Proteinuria, unspecified: R80.9

## 2017-01-19 NOTE — Addendum Note (Signed)
Addended by: Marcelino Duster on: 01/19/2017 11:51 PM   Modules accepted: Orders

## 2017-01-22 ENCOUNTER — Encounter: Payer: Self-pay | Admitting: Internal Medicine

## 2017-01-22 DIAGNOSIS — K3184 Gastroparesis: Secondary | ICD-10-CM

## 2017-01-22 DIAGNOSIS — E1143 Type 2 diabetes mellitus with diabetic autonomic (poly)neuropathy: Secondary | ICD-10-CM | POA: Insufficient documentation

## 2017-01-22 HISTORY — DX: Type 2 diabetes mellitus with diabetic autonomic (poly)neuropathy: K31.84

## 2017-01-22 HISTORY — DX: Type 2 diabetes mellitus with diabetic autonomic (poly)neuropathy: E11.43

## 2017-02-21 ENCOUNTER — Ambulatory Visit: Payer: Self-pay | Admitting: Internal Medicine

## 2017-03-06 ENCOUNTER — Ambulatory Visit: Payer: Self-pay | Admitting: Internal Medicine

## 2017-03-13 ENCOUNTER — Ambulatory Visit: Payer: Self-pay | Admitting: Internal Medicine

## 2017-07-07 ENCOUNTER — Other Ambulatory Visit: Payer: Self-pay

## 2017-07-07 MED ORDER — METFORMIN HCL ER 500 MG PO TB24: ORAL_TABLET | ORAL | 11 refills | Status: DC

## 2017-07-10 ENCOUNTER — Encounter: Payer: Self-pay | Admitting: Internal Medicine

## 2017-07-10 ENCOUNTER — Ambulatory Visit: Payer: Self-pay | Admitting: Internal Medicine

## 2017-07-10 VITALS — BP 170/100 | HR 74 | Resp 12 | Ht 61.0 in | Wt 150.0 lb

## 2017-07-10 DIAGNOSIS — M25562 Pain in left knee: Secondary | ICD-10-CM

## 2017-07-10 DIAGNOSIS — M5441 Lumbago with sciatica, right side: Secondary | ICD-10-CM

## 2017-07-10 DIAGNOSIS — R809 Proteinuria, unspecified: Secondary | ICD-10-CM

## 2017-07-10 DIAGNOSIS — M5442 Lumbago with sciatica, left side: Secondary | ICD-10-CM

## 2017-07-10 DIAGNOSIS — H4901 Third [oculomotor] nerve palsy, right eye: Secondary | ICD-10-CM

## 2017-07-10 DIAGNOSIS — E118 Type 2 diabetes mellitus with unspecified complications: Secondary | ICD-10-CM

## 2017-07-10 LAB — GLUCOSE, POCT (MANUAL RESULT ENTRY): POC Glucose: 123 mg/dl — AB (ref 70–99)

## 2017-07-10 MED ORDER — CYCLOBENZAPRINE HCL 5 MG PO TABS: ORAL_TABLET | ORAL | 0 refills | Status: DC

## 2017-07-10 NOTE — Progress Notes (Signed)
Subjective:    Patient ID: Ryan Wells, male    DOB: Sep 30, 1955, 61 y.o.   MRN: [id number]  HPI   Here for an acute appointment limited to knee and back pain. He has been lost to follow up for 6 months.   Has failed many appointments to get him set up with orange card and cancelled appointments or no showed with me.  1.  Bilateral low back pain:  Has had for 1 month.  States he fell, but cannot get a clear history as to what happened.  Describes being nauseated and dizzy, and fell backward against a wheelchair.  Had a hard time getting up.  His son was ultimately able to help him up. Pain is improved from when initially injured himself. Pain radiated down posterior legs bilaterally down to his feet with fall. Continues with that at times as well. Unclear if he has had weakness since the fall or if limitations of leg movement due to pain. Sounds like he felt like he had loss of sensation to legs for a few minutes after the fall.  2.  Left knee pain:  States has scars on knees bilaterally from fall.  Had pain in the left knee before the fall, but worse since the fall. Points to an area inferior to and lateral to his patella on the left knee as the source of his pain.  He is unable to characterize well.  Hurts to turn over in sleep.  He cannot say whether the knee swells.    Has been working on gentle ROM for his knee and back.  Lies down and rests when really bothering him.  DM/essential hypertension, gastroparesis, hypothyroidism:  He is only taking his Metformin.  Unable to get him to clarify why he is not getting and taking his meds.  Blames first not being able to afford coming back to the clinic, though he has medication available without an appointment.  Then states no one willing to get his medication for him.  His wife works and he states is not willing to pick meds up or does not have the time. He does not have good relationships with wife or son who lives with them on and  off.  Left 3rd nerve palsy:  Listed as right in past records, but patient with left upper eyelid partially closed during history today. Looks as though he is squinting throughout history taking.  Current Meds  Medication Sig  . metFORMIN (GLUCOPHAGE-XR) 500 MG 24 hr tablet 2 tabs by mouth in morning with breakfast    No Known Allergies    Review of Systems     Objective:   Physical Exam  NAD HEENT: PERRL, EOMI--noted during history and exam that patient's movements of both eyes were normal again.  He appears to be squinting left eye throughout history and beginning of exam.  When asked why, he states he does this as his vision is not as good otherwise.  Asked him to stop and motor control of eyelids as well as EOMI both eyes appears normal.  TMs pearly gray, throat without injection Neck: Supple, No adenopathy Chest:  CTA CV:  RRR without murmur or rub. Back:  Mild tendernss along paraspinous musculature bilaterally of L/S spin. NT over spinous processes.   Left knee:  No scarring noted.  Full ROM, no effusion or erythema.  NT over joint lines.  No pain or laxity with stress maneuvers of cruciates and collaterals.  More tender of surrounding  soft tissue. Neuro:  Motor 5/5 LE, DTRs 2+/4.  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         Assessment & Plan:  1.  Low back pain:  Cyclobenzaprine 5-10 mg mainly at bedtime.  Has Rx for Meloxicam.  Offered PT referral, but patient declined.  2.  Left knee pain:  Meloxicam and cyclobenzaprine as above.  3.  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  Exam when he stops squinting is normal bilaterally.  4.  DM, microalbuminuria, etc:  Discussed could have his meds delivered through [facility], but he would need to make sure he can pay when delivered.  He is not interested in this change. Went over options  regarding picking up his meds. He does not seem to want to figure out a solution.  States he will ask his wife to pick up meds on way home from work every month. Did not get influenza vaccine inadvertently today. Will call to have him obtain one. Followup in 2 months for his health concerns

## 2017-07-22 ENCOUNTER — Telehealth: Payer: Self-pay | Admitting: Internal Medicine

## 2017-07-31 NOTE — Telephone Encounter (Signed)
Phone number is disconnected 

## 2017-08-01 ENCOUNTER — Encounter: Payer: Self-pay | Admitting: Internal Medicine

## 2017-08-01 NOTE — Telephone Encounter (Signed)
Ryan Wells got on hold with his wife around 2:00 p.m. Who stated patient was on the bathroom and agreed to tell patient to please give [place] a call back. Later on; around 3:45 p.m. Ryan Wells called three more times with no answer.  A letter will be send out.  Patient does not have any more number where can be reach.

## 2017-08-06 ENCOUNTER — Encounter: Payer: Self-pay | Admitting: Internal Medicine

## 2017-09-11 ENCOUNTER — Ambulatory Visit: Payer: Self-pay | Admitting: Internal Medicine

## 2017-11-13 ENCOUNTER — Telehealth: Payer: Self-pay | Admitting: Internal Medicine

## 2017-11-13 NOTE — Telephone Encounter (Signed)
Patient came in requesting appointment today. He was informed that he had several NO SHOWS a past due balance that needs to be satisfied before future appointments can be scheduled. Patient verbally agreed to come in and pay his past due on April 15th and to bring Medicaid card to schedule appointment.   Patient stated he now has [employer]. Information about Medicaid Transportation was given to patient so he can arrange for future appointments so it prevent him from missing appointments. Patient verbalized understanding that if he NO SHOWS for next appointment he may be dismissed from the practice

## 2018-02-13 ENCOUNTER — Ambulatory Visit (INDEPENDENT_AMBULATORY_CARE_PROVIDER_SITE_OTHER): Payer: Self-pay | Admitting: Physician Assistant

## 2018-03-17 IMAGING — US US RENAL
1 series · 14 of 25 positions shown · non-contrast
Comparison: Prior CT from [DATE]

CLINICAL DATA: Initial evaluation for acute renal injury.

EXAM:
RENAL / URINARY TRACT ULTRASOUND COMPLETE

[Series 1: us renal · 0.20mm/px · 14 of 46 slices shown]
[im 1/46]
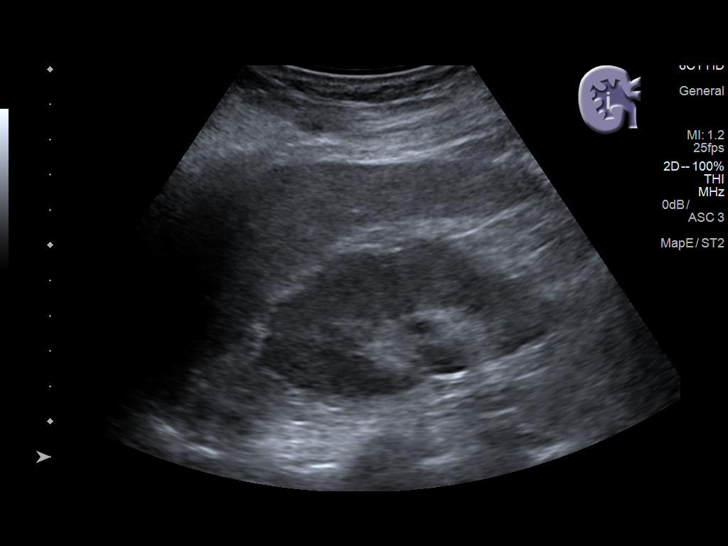
[im 4/46]
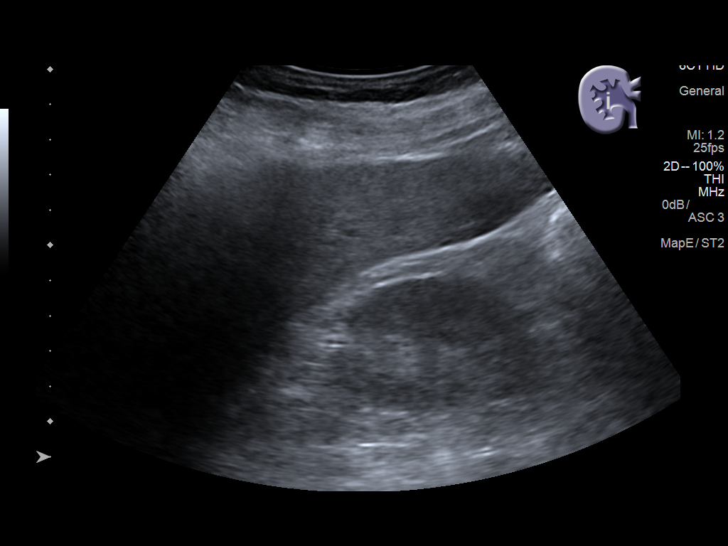
[im 8/46]
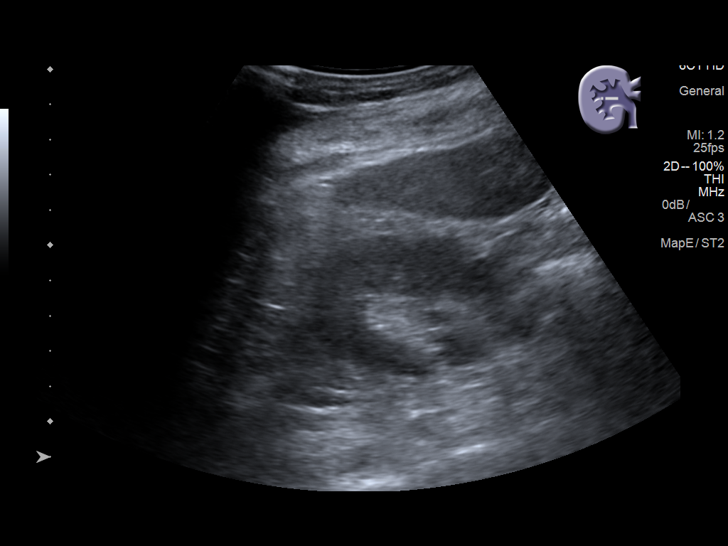
[im 12/46]
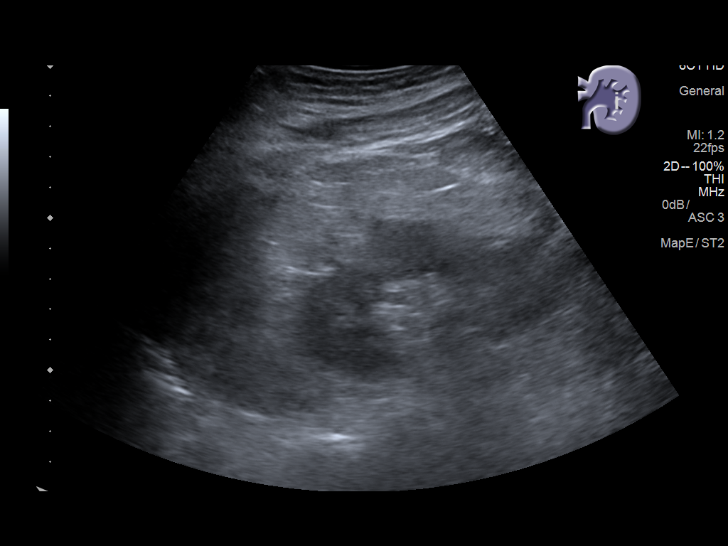
[im 16/46]
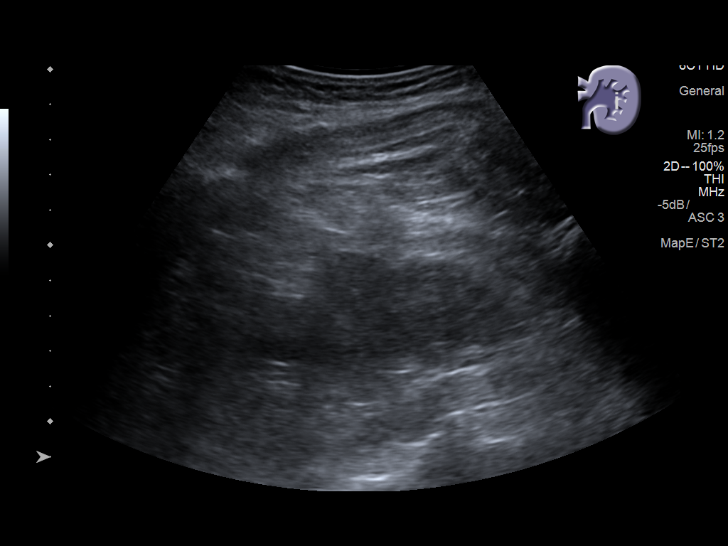
[im 17/46]
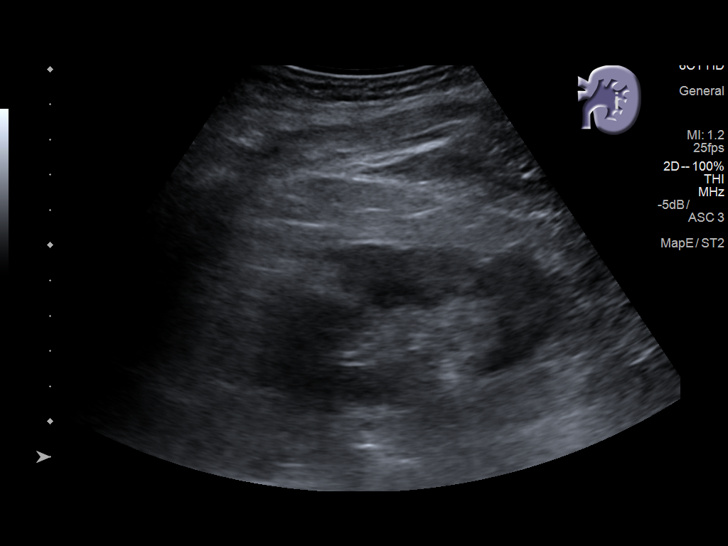
[im 21/46]
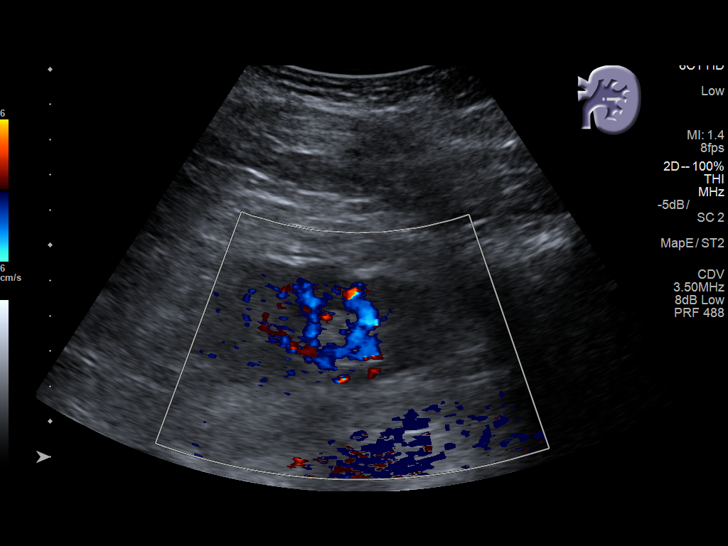
[im 25/46]
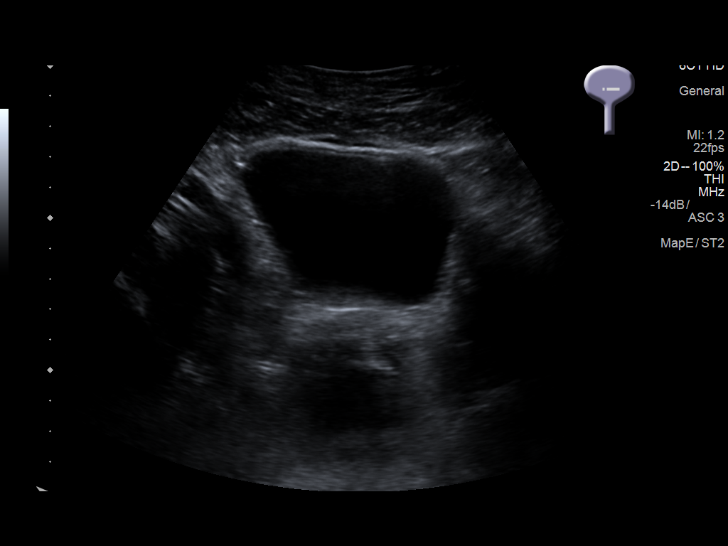
[im 29/46]
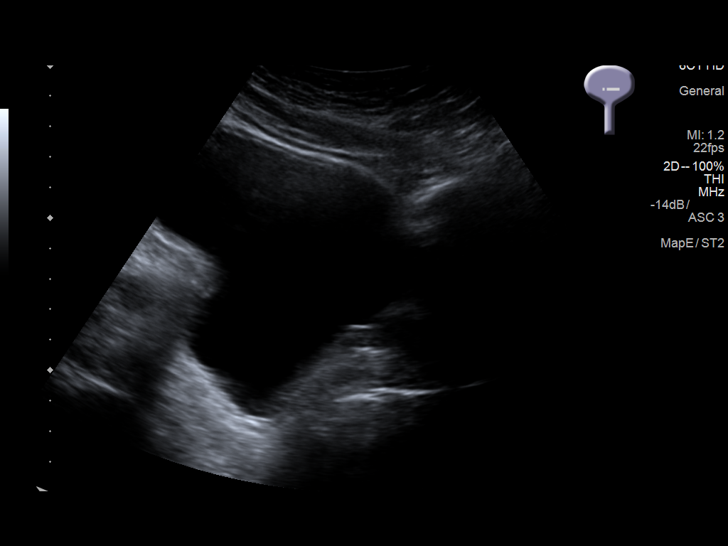
[im 31/46]
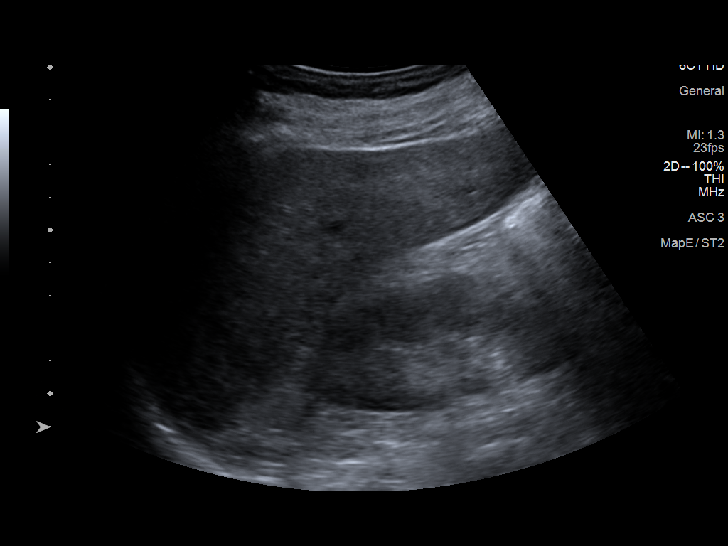
[im 34/46]
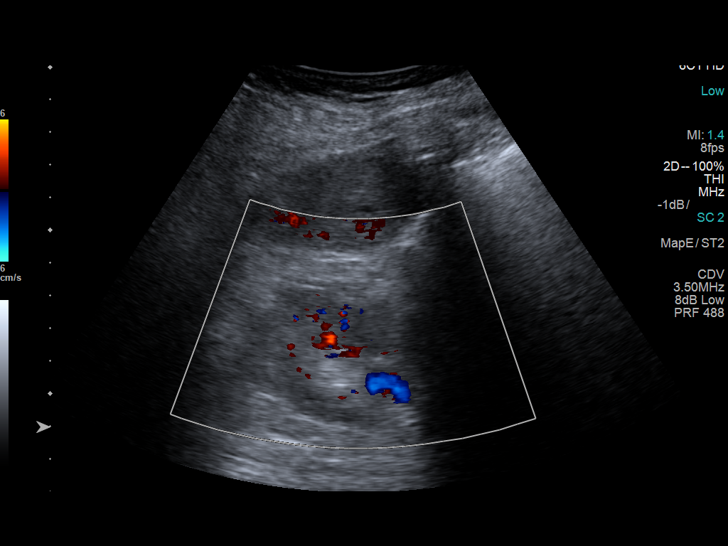
[im 38/46]
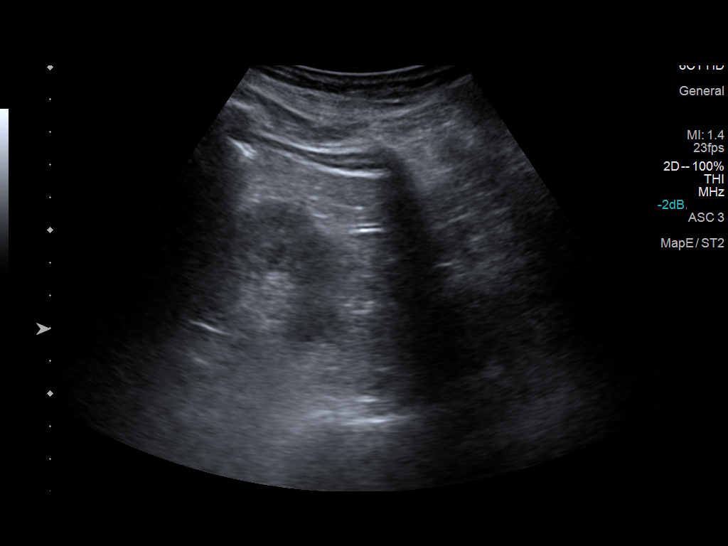
[im 42/46]
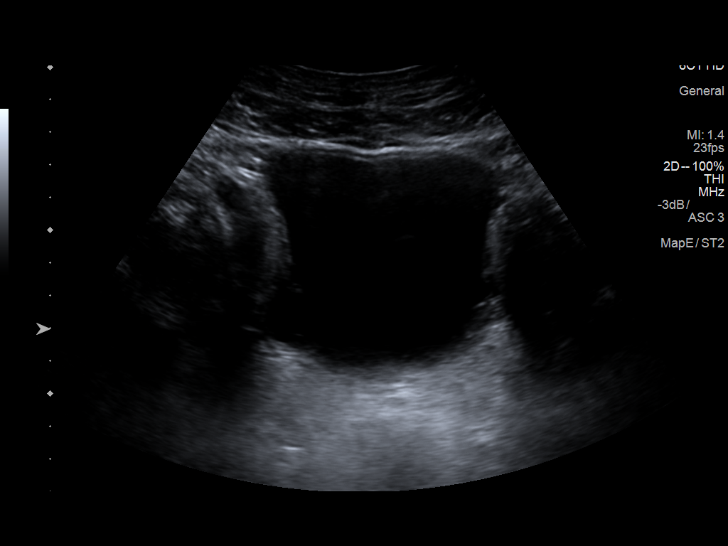
[im 46/46]
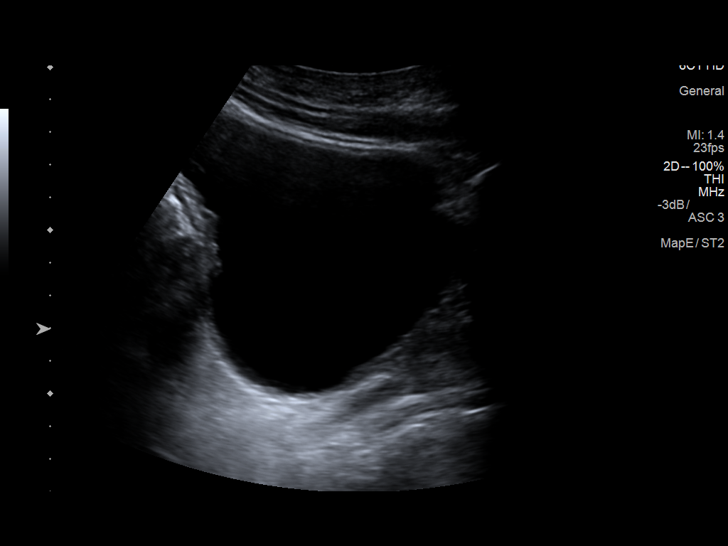

[14 of 25 positions shown; findings below may reference images not displayed]

FINDINGS: Right Kidney:

Length: 8.8 cm. Echogenicity within normal limits. No mass or
hydronephrosis visualized.

Left Kidney:

Length: 9.8 cm. Echogenicity within normal limits. Lobulated renal
contour noted. No mass or hydronephrosis visualized.

Bladder:

Appears normal for degree of bladder distention. No made of an
enlarged prostate.
IMPRESSION: 1. Negative renal ultrasound.  No hydronephrosis.
2. Enlarged prostate.

## 2018-03-26 ENCOUNTER — Ambulatory Visit: Payer: Self-pay | Admitting: Family Medicine

## 2018-03-26 DIAGNOSIS — Z0289 Encounter for other administrative examinations: Secondary | ICD-10-CM

## 2018-04-07 ENCOUNTER — Encounter (HOSPITAL_COMMUNITY): Payer: Self-pay | Admitting: Emergency Medicine

## 2018-04-07 ENCOUNTER — Inpatient Hospital Stay (HOSPITAL_COMMUNITY)
Admission: EM | Admit: 2018-04-07 | Discharge: 2018-04-11 | DRG: 683 | Disposition: A | Discharge status: Home / Self Care | Payer: Medicare Other | Attending: Internal Medicine | Admitting: Internal Medicine

## 2018-04-07 ENCOUNTER — Other Ambulatory Visit: Payer: Self-pay

## 2018-04-07 DIAGNOSIS — E8889 Other specified metabolic disorders: Secondary | ICD-10-CM | POA: Diagnosis present

## 2018-04-07 DIAGNOSIS — D631 Anemia in chronic kidney disease: Secondary | ICD-10-CM | POA: Diagnosis present

## 2018-04-07 DIAGNOSIS — N179 Acute kidney failure, unspecified: Secondary | ICD-10-CM | POA: Diagnosis not present

## 2018-04-07 DIAGNOSIS — E1143 Type 2 diabetes mellitus with diabetic autonomic (poly)neuropathy: Secondary | ICD-10-CM | POA: Diagnosis present

## 2018-04-07 DIAGNOSIS — K3184 Gastroparesis: Secondary | ICD-10-CM | POA: Diagnosis present

## 2018-04-07 DIAGNOSIS — I674 Hypertensive encephalopathy: Secondary | ICD-10-CM | POA: Diagnosis present

## 2018-04-07 DIAGNOSIS — Z8249 Family history of ischemic heart disease and other diseases of the circulatory system: Secondary | ICD-10-CM

## 2018-04-07 DIAGNOSIS — E1165 Type 2 diabetes mellitus with hyperglycemia: Secondary | ICD-10-CM | POA: Diagnosis present

## 2018-04-07 DIAGNOSIS — I10 Essential (primary) hypertension: Secondary | ICD-10-CM | POA: Diagnosis present

## 2018-04-07 DIAGNOSIS — E1122 Type 2 diabetes mellitus with diabetic chronic kidney disease: Secondary | ICD-10-CM | POA: Diagnosis present

## 2018-04-07 DIAGNOSIS — N184 Chronic kidney disease, stage 4 (severe): Secondary | ICD-10-CM

## 2018-04-07 DIAGNOSIS — E038 Other specified hypothyroidism: Secondary | ICD-10-CM | POA: Diagnosis present

## 2018-04-07 DIAGNOSIS — Z79890 Hormone replacement therapy (postmenopausal): Secondary | ICD-10-CM

## 2018-04-07 DIAGNOSIS — E1121 Type 2 diabetes mellitus with diabetic nephropathy: Secondary | ICD-10-CM | POA: Diagnosis present

## 2018-04-07 DIAGNOSIS — H4900 Third [oculomotor] nerve palsy, unspecified eye: Secondary | ICD-10-CM | POA: Diagnosis present

## 2018-04-07 DIAGNOSIS — Z7984 Long term (current) use of oral hypoglycemic drugs: Secondary | ICD-10-CM

## 2018-04-07 DIAGNOSIS — Z7982 Long term (current) use of aspirin: Secondary | ICD-10-CM

## 2018-04-07 DIAGNOSIS — Z87891 Personal history of nicotine dependence: Secondary | ICD-10-CM

## 2018-04-07 DIAGNOSIS — Z833 Family history of diabetes mellitus: Secondary | ICD-10-CM

## 2018-04-07 DIAGNOSIS — I129 Hypertensive chronic kidney disease with stage 1 through stage 4 chronic kidney disease, or unspecified chronic kidney disease: Secondary | ICD-10-CM | POA: Diagnosis not present

## 2018-04-07 DIAGNOSIS — Z79899 Other long term (current) drug therapy: Secondary | ICD-10-CM

## 2018-04-07 DIAGNOSIS — E039 Hypothyroidism, unspecified: Secondary | ICD-10-CM | POA: Diagnosis present

## 2018-04-07 DIAGNOSIS — E119 Type 2 diabetes mellitus without complications: Secondary | ICD-10-CM

## 2018-04-07 DIAGNOSIS — N189 Chronic kidney disease, unspecified: Secondary | ICD-10-CM | POA: Diagnosis present

## 2018-04-07 DIAGNOSIS — S37009A Unspecified injury of unspecified kidney, initial encounter: Secondary | ICD-10-CM

## 2018-04-07 DIAGNOSIS — Z841 Family history of disorders of kidney and ureter: Secondary | ICD-10-CM

## 2018-04-07 DIAGNOSIS — N19 Unspecified kidney failure: Secondary | ICD-10-CM | POA: Diagnosis present

## 2018-04-07 DIAGNOSIS — D649 Anemia, unspecified: Secondary | ICD-10-CM | POA: Diagnosis present

## 2018-04-07 DIAGNOSIS — E875 Hyperkalemia: Secondary | ICD-10-CM | POA: Diagnosis present

## 2018-04-07 DIAGNOSIS — E1142 Type 2 diabetes mellitus with diabetic polyneuropathy: Secondary | ICD-10-CM | POA: Diagnosis present

## 2018-04-07 DIAGNOSIS — Z9849 Cataract extraction status, unspecified eye: Secondary | ICD-10-CM

## 2018-04-07 LAB — URINALYSIS, ROUTINE W REFLEX MICROSCOPIC
Bilirubin Urine: NEGATIVE
Glucose, UA: 150 mg/dL — AB
Hgb urine dipstick: NEGATIVE
Ketones, ur: NEGATIVE mg/dL
Leukocytes, UA: NEGATIVE
Nitrite: NEGATIVE
Protein, ur: >=300 mg/dL — AB
Specific Gravity, Urine: 1.015 (ref 1.005–1.030)
pH: 7 (ref 5.0–8.0)

## 2018-04-07 LAB — CBC
HCT: 34.4 % — ABNORMAL LOW (ref 39.0–52.0)
Hemoglobin: 11.1 g/dL — ABNORMAL LOW (ref 13.0–17.0)
MCH: 29.1 pg (ref 26.0–34.0)
MCHC: 32.3 g/dL (ref 30.0–36.0)
MCV: 90.3 fL (ref 78.0–100.0)
Platelets: 192 10*3/uL (ref 150–400)
RBC: 3.81 MIL/uL — ABNORMAL LOW (ref 4.22–5.81)
RDW: 12.4 % (ref 11.5–15.5)
WBC: 7.6 10*3/uL (ref 4.0–10.5)

## 2018-04-07 LAB — COMPREHENSIVE METABOLIC PANEL
ALT: 11 U/L (ref 0–44)
AST: 19 U/L (ref 15–41)
Albumin: 3 g/dL — ABNORMAL LOW (ref 3.5–5.0)
Alkaline Phosphatase: 126 U/L (ref 38–126)
Anion gap: 8 (ref 5–15)
BUN: 40 mg/dL — ABNORMAL HIGH (ref 8–23)
CO2: 24 mmol/L (ref 22–32)
Calcium: 8.5 mg/dL — ABNORMAL LOW (ref 8.9–10.3)
Chloride: 109 mmol/L (ref 98–111)
Creatinine, Ser: 3.11 mg/dL — ABNORMAL HIGH (ref 0.61–1.24)
GFR calc Af Amer: 23 mL/min — ABNORMAL LOW (ref >60)
GFR calc non Af Amer: 20 mL/min — ABNORMAL LOW (ref >60)
Glucose, Bld: 115 mg/dL — ABNORMAL HIGH (ref 70–99)
Potassium: 5.1 mmol/L (ref 3.5–5.1)
Sodium: 141 mmol/L (ref 135–145)
Total Bilirubin: 0.6 mg/dL (ref 0.3–1.2)
Total Protein: 5.9 g/dL — ABNORMAL LOW (ref 6.5–8.1)

## 2018-04-07 LAB — LIPASE, BLOOD: Lipase: 40 U/L (ref 11–51)

## 2018-04-07 NOTE — ED Notes (Signed)
Need translator. Translator in room.

## 2018-04-07 NOTE — ED Triage Notes (Signed)
Pt reports he went to the doctors last week and they told him to come here because his"acid was high". Pt reports lower mid abd pain with vomiting that started 8 months ago. Pt reports ankle swelling over the last 6-7 months that has gotten worse. Pt is Spanish speaking.

## 2018-04-08 ENCOUNTER — Encounter (HOSPITAL_COMMUNITY): Payer: Self-pay | Admitting: Family Medicine

## 2018-04-08 ENCOUNTER — Emergency Department (HOSPITAL_COMMUNITY): Payer: Medicare Other

## 2018-04-08 DIAGNOSIS — N189 Chronic kidney disease, unspecified: Secondary | ICD-10-CM | POA: Diagnosis present

## 2018-04-08 DIAGNOSIS — N19 Unspecified kidney failure: Secondary | ICD-10-CM

## 2018-04-08 DIAGNOSIS — E119 Type 2 diabetes mellitus without complications: Secondary | ICD-10-CM

## 2018-04-08 DIAGNOSIS — D631 Anemia in chronic kidney disease: Secondary | ICD-10-CM | POA: Diagnosis present

## 2018-04-08 DIAGNOSIS — E039 Hypothyroidism, unspecified: Secondary | ICD-10-CM

## 2018-04-08 DIAGNOSIS — I10 Essential (primary) hypertension: Secondary | ICD-10-CM | POA: Diagnosis not present

## 2018-04-08 DIAGNOSIS — D649 Anemia, unspecified: Secondary | ICD-10-CM | POA: Diagnosis present

## 2018-04-08 DIAGNOSIS — N179 Acute kidney failure, unspecified: Secondary | ICD-10-CM

## 2018-04-08 LAB — FERRITIN: Ferritin: 123 ng/mL (ref 24–336)

## 2018-04-08 LAB — BASIC METABOLIC PANEL
Anion gap: 8 (ref 5–15)
BUN: 39 mg/dL — ABNORMAL HIGH (ref 8–23)
CO2: 19 mmol/L — ABNORMAL LOW (ref 22–32)
Calcium: 8 mg/dL — ABNORMAL LOW (ref 8.9–10.3)
Chloride: 113 mmol/L — ABNORMAL HIGH (ref 98–111)
Creatinine, Ser: 2.83 mg/dL — ABNORMAL HIGH (ref 0.61–1.24)
GFR calc Af Amer: 26 mL/min — ABNORMAL LOW (ref >60)
GFR calc non Af Amer: 22 mL/min — ABNORMAL LOW (ref >60)
Glucose, Bld: 85 mg/dL (ref 70–99)
Potassium: 5.2 mmol/L — ABNORMAL HIGH (ref 3.5–5.1)
Sodium: 140 mmol/L (ref 135–145)

## 2018-04-08 LAB — HIV ANTIBODY (ROUTINE TESTING W REFLEX): HIV Screen 4th Generation wRfx: NONREACTIVE

## 2018-04-08 LAB — TSH: TSH: 16.498 u[IU]/mL — ABNORMAL HIGH (ref 0.350–4.500)

## 2018-04-08 LAB — FOLATE: Folate: 8.8 ng/mL (ref >5.9)

## 2018-04-08 LAB — SODIUM, URINE, RANDOM: Sodium, Ur: 105 mmol/L

## 2018-04-08 LAB — IRON AND TIBC
Iron: 79 ug/dL (ref 45–182)
Saturation Ratios: 41 % — ABNORMAL HIGH (ref 17.9–39.5)
TIBC: 195 ug/dL — ABNORMAL LOW (ref 250–450)
UIBC: 116 ug/dL

## 2018-04-08 LAB — GLUCOSE, CAPILLARY
Glucose-Capillary: 90 mg/dL (ref 70–99)
Glucose-Capillary: 145 mg/dL — ABNORMAL HIGH (ref 70–99)
Glucose-Capillary: 78 mg/dL (ref 70–99)
Glucose-Capillary: 130 mg/dL — ABNORMAL HIGH (ref 70–99)

## 2018-04-08 LAB — RETICULOCYTES
RBC.: 3.54 MIL/uL — ABNORMAL LOW (ref 4.22–5.81)
Retic Count, Absolute: 42.5 10*3/uL (ref 19.0–186.0)
Retic Ct Pct: 1.2 % (ref 0.4–3.1)

## 2018-04-08 LAB — T4, FREE: Free T4: 0.91 ng/dL (ref 0.82–1.77)

## 2018-04-08 LAB — CREATININE, URINE, RANDOM: Creatinine, Urine: 32.2 mg/dL

## 2018-04-08 LAB — VITAMIN B12: Vitamin B-12: 229 pg/mL (ref 180–914)

## 2018-04-08 MED ORDER — HYDRALAZINE HCL 20 MG/ML IJ SOLN: 10.0000 mg | INTRAMUSCULAR | Status: DC | PRN

## 2018-04-08 MED ORDER — ONDANSETRON HCL 4 MG PO TABS: 4.0000 mg | ORAL_TABLET | Freq: Four times a day (QID) | ORAL | Status: DC | PRN

## 2018-04-08 MED ORDER — SODIUM CHLORIDE 0.9 % IV SOLN
INTRAVENOUS | Status: AC
  Administered 2018-04-08: 05:00:00 via INTRAVENOUS

## 2018-04-08 MED ORDER — HEPARIN SODIUM (PORCINE) 5000 UNIT/ML IJ SOLN
5000.0000 [IU] | Freq: Three times a day (TID) | INTRAMUSCULAR | Status: DC
  Administered 2018-04-08 – 2018-04-11 (×8): 5000 [IU] via SUBCUTANEOUS
  Filled 2018-04-08 (×7): qty 1

## 2018-04-08 MED ORDER — ONDANSETRON HCL 4 MG/2ML IJ SOLN: 4.0000 mg | Freq: Four times a day (QID) | INTRAMUSCULAR | Status: DC | PRN

## 2018-04-08 MED ORDER — INSULIN ASPART 100 UNIT/ML ~~LOC~~ SOLN: 0.0000 [IU] | Freq: Every day | SUBCUTANEOUS | Status: DC

## 2018-04-08 MED ORDER — ACETAMINOPHEN 325 MG PO TABS
650.0000 mg | ORAL_TABLET | Freq: Four times a day (QID) | ORAL | Status: DC | PRN
  Administered 2018-04-08 (×2): 650 mg via ORAL
  Filled 2018-04-08 (×2): qty 2

## 2018-04-08 MED ORDER — INSULIN ASPART 100 UNIT/ML ~~LOC~~ SOLN
0.0000 [IU] | Freq: Three times a day (TID) | SUBCUTANEOUS | Status: DC
  Administered 2018-04-10: 1 [IU] via SUBCUTANEOUS

## 2018-04-08 MED ORDER — ACETAMINOPHEN 650 MG RE SUPP: 650.0000 mg | Freq: Four times a day (QID) | RECTAL | Status: DC | PRN

## 2018-04-08 MED ORDER — LEVOTHYROXINE SODIUM 25 MCG PO TABS
25.0000 ug | ORAL_TABLET | Freq: Every day | ORAL | Status: DC
  Administered 2018-04-08 – 2018-04-11 (×4): 25 ug via ORAL
  Filled 2018-04-08 (×4): qty 1

## 2018-04-08 MED ORDER — SODIUM CHLORIDE 0.9 % IV BOLUS
1000.0000 mL | Freq: Once | INTRAVENOUS | Status: AC
  Administered 2018-04-08: 1000 mL via INTRAVENOUS

## 2018-04-08 NOTE — Progress Notes (Signed)
PROGRESS NOTE    Ryan Wells  HCW:[id number] DOB: 09-28-1955 DOA: 04/07/2018 PCP: Mack Hook, MD    Brief Narrative 62 year old Hispanic male with a history of type II, hypertension, diabetes and,, presenting to the emergency abnormal outpatient lab work noted.  On presentation, he was mildly, hypertensive, but his pulse is normal.  His creatinine was 3.11, 1.1 9018.  He is hemoglobin was 11.1, down from 2.4 in May 2018, UA with proteinuria.  CT of the abdomen and was negative for any intra-abdominal pathology.  No hydronephrosis was seen.  He was admitted for observation and management of his abnormal as well as symptoms   Assessment & Plan:   Principal Problem:   Renal failure Active Problems:   Diabetes type 2, uncontrolled ([place])   Hypothyroidism   Normocytic anemia   Hypertension    Acute  Kidney disease of uncertain chronicity : likely due to NSAIDS and ACE I   Admission creatinine was 3.11 with new CMET pending  UA with proteinuria. GFR on admission 23    CT of the abdomen and pelvis  was negative for any intra-abdominal pathology.  No hydronephrosis was seen.  Patient denies dysuria, hematuria and is making urine.   U Cr , MIcroalbumin/Cr Urine ratio, Urine sodium, Urea Nitrogen in urine is pending  Lab Results  Component Value Date   CREATININE 3.11 (H) 04/07/2018   CREATININE 1.19 12/19/2016   CREATININE 1.22 11/28/2016  Continue IV fluids at 100 cc/h  Follow urine studies  Follow BMP daily  Avoid NSAIDS or ACE I    Hypertension BP  181/86   Pulse 60 patient was placed, which was held due to renal failure  Continue  hydralazine IV for now   Type II Diabetes Current blood sugar level is 90, A1C 14 in April, not drawn to date during this admission . Metformin held on presentation  Lab Results  Component Value Date   HGBA1C 14.4 (H) 11/28/2016   Hgb A1C Hold home oral diabetic medications.  SSI Check A1C    Hypothyroidism: Labs show TSH 16.498, was  10 in 12/2017 . Patient symptomatic, cold intolerant and tired . Has not been taking Synthroid due to cost.  Will resume Synthroid at 25 mcg daily   Anemia of chronic disease (CKD) , normocytic, Hemoglobin on admission 11, down from 14 in 12/2017. No bleeding issues .BUN 40 on admission  Check Hemoccult  Repeat CBC in am  No transfusion is indicated at this time Anemia panel    DVT prophylaxis: sq heparin  Code Status: Full code  Family Communication:  None  Disposition Plan: Home likely in am  Consultants:   None    Procedures: None    Antimicrobials: None    Subjective: Feels very tired, "the room is cold" . Denies fevers, chills, night sweats  Denies any respiratory complaints. Denies any chest pain or palpitations. Denies lower extremity swelling. Denies nausea, heartburn or change in bowel habits. HIs abdominal discomfort still present but no frank pain  Appetite is normal. Denies any dysuria. Denies abnormal skin rashes, or neuropathy. Denies any bleeding issues  Ambulating without difficulty.    Objective: Vitals:   04/08/18 0530 04/08/18 0600 04/08/18 0634 04/08/18 0730  BP: (!) 159/87 (!) 161/82 (!) 183/90 (!) 181/86  Pulse: 66 67 66 60  Resp:   16 20  Temp:   97.6 F (36.4 C) 97.8 F (36.6 C)  TempSrc:   Oral Oral  SpO2: 99% 100% 100% 100%  Weight:      Height:        Intake/Output Summary (Last 24 hours) at 04/08/2018 1017 Last data filed at 04/08/2018 0900 Gross per 24 hour  Intake 2300 ml  Output -  Net 2300 ml   Filed Weights   04/07/18 2217  Weight: 68 kg    Examination:  General exam: Appears calm and comfortable, chornically fatigued  Respiratory system: Clear to auscultation. Respiratory effort normal. Cardiovascular system: S1 & S2 heard, RRR. No JVD, soft 1/6 murmurs, no rubs, gallops or clicks. No pedal edema. Gastrointestinal system: Abdomen is nondistended, mildly tender to palpation, without discrete masses, or  Organomegaly . Normal  bowel sounds heard. Central nervous system: Alert and oriented. No focal neurological deficits. Extremities: Symmetric 5 x 5 power . Skin: No rashes, lesions or ulcers Psychiatry: Judgement and insight appear normal. Mood flat affect     Data Reviewed: I have personally reviewed following labs and imaging studies  CBC: Recent Labs  Lab 04/07/18 2227  WBC 7.6  HGB 11.1*  HCT 34.4*  MCV 90.3  PLT 433   Basic Metabolic Panel: Recent Labs  Lab 04/07/18 2227  NA 141  K 5.1  CL 109  CO2 24  GLUCOSE 115*  BUN 40*  CREATININE 3.11*  CALCIUM 8.5*   GFR: Estimated Creatinine Clearance: 20.6 mL/min (A) (by C-G formula based on SCr of 3.11 mg/dL (H)). Liver Function Tests: Recent Labs  Lab 04/07/18 2227  AST 19  ALT 11  ALKPHOS 126  BILITOT 0.6  PROT 5.9*  ALBUMIN 3.0*   Recent Labs  Lab 04/07/18 2227  LIPASE 40   No results for input(s): AMMONIA in the last 168 hours. Coagulation Profile: No results for input(s): INR, PROTIME in the last 168 hours. Cardiac Enzymes: No results for input(s): CKTOTAL, CKMB, CKMBINDEX, TROPONINI in the last 168 hours. BNP (last 3 results) No results for input(s): PROBNP in the last 8760 hours. HbA1C: No results for input(s): HGBA1C in the last 72 hours. CBG: Recent Labs  Lab 04/08/18 0729  GLUCAP 90   Lipid Profile: No results for input(s): CHOL, HDL, LDLCALC, TRIG, CHOLHDL, LDLDIRECT in the last 72 hours. Thyroid Function Tests: Recent Labs    04/08/18 0654  TSH 16.498*   Anemia Panel: No results for input(s): VITAMINB12, FOLATE, FERRITIN, TIBC, IRON, RETICCTPCT in the last 72 hours. Sepsis Labs: No results for input(s): PROCALCITON, LATICACIDVEN in the last 168 hours.  No results found for this or any previous visit (from the past 240 hour(s)).       Radiology Studies: Ct Abdomen Pelvis Wo Contrast  Result Date: 04/08/2018 CLINICAL DATA:  62 year old male with abdominal pain. EXAM: CT ABDOMEN AND PELVIS  WITHOUT CONTRAST TECHNIQUE: Multidetector CT imaging of the abdomen and pelvis was performed following the standard protocol without IV contrast. COMPARISON:  Abdominal radiograph dated 07/30/2012 FINDINGS: Evaluation of this exam is limited in the absence of intravenous contrast. Evaluation is also limited due to respiratory motion artifact. Lower chest: The visualized lung bases are clear. No intra-abdominal free air or free fluid. Hepatobiliary: No focal liver abnormality is seen. No gallstones, gallbladder wall thickening, or biliary dilatation. Pancreas: The pancreas is unremarkable. A 3.3 x 2.3 cm ill-defined structure inferior to the tail of the pancreas (series 3, image 20 and coronal series 6, image 51) is not well characterized but most likely represents artifact from respiratory motion. A small amount of fluid is less likely. Correlation with clinical exam and pancreatic enzymes recommended  to exclude pancreatitis. Spleen: Normal in size without focal abnormality. Adrenals/Urinary Tract: Adrenal glands are unremarkable. Kidneys are normal, without renal calculi, focal lesion, or hydronephrosis. Bladder is unremarkable. Stomach/Bowel: There is moderate stool throughout the colon. There is no bowel obstruction or active inflammation. Normal appendix. Vascular/Lymphatic: The abdominal aorta and IVC are grossly unremarkable on this noncontrast CT. No portal venous gas. There is no adenopathy. Reproductive: The prostate and seminal vesicles are grossly unremarkable. No pelvic mass. Other: Mild diffuse subcutaneous edema in the pelvis. No fluid collection. Musculoskeletal: Mild degenerative changes of the spine with disc desiccation and posterior disc bulge at L5-S1. Multilevel facet hypertrophy. No acute osseous pathology. IMPRESSION: 1. No definite acute intra-abdominal or pelvic pathology. Evaluation is however limited due to respiratory motion. Hypoattenuating area inferior to the tail of the pancreas,  likely artifactual. Correlation with pancreatic enzymes recommended to exclude pancreatitis. 2. No bowel obstruction or active inflammation.  Normal appendix. Electronically Signed   By: Anner Crete M.D.   On: 04/08/2018 03:03        Scheduled Meds: . heparin  5,000 Units Subcutaneous Q8H  . insulin aspart  0-5 Units Subcutaneous QHS  . insulin aspart  0-9 Units Subcutaneous TID WC  . levothyroxine  25 mcg Oral QAC breakfast   Continuous Infusions: . sodium chloride 100 mL/hr at 04/08/18 0457     LOS: 0 days    Time spent: 26 min    Sharene Butters, MD [facility] Pager [phone]  If 7PM-7AM, please contact night-coverage [email or web address] Password [facility] 04/08/2018, 10:17 AM

## 2018-04-08 NOTE — ED Notes (Signed)
ED Provider at bedside. 

## 2018-04-08 NOTE — H&P (Signed)
History and Physical    Ryan Wells BLT:[id number] DOB: [birth year] DOA: 04/07/2018  PCP: Mack Hook, MD   Patient coming from: Home   Chief Complaint: Abnormal labs  HPI: Ryan Wells is a 62 y.o. male with medical history significant for type 2 diabetes mellitus, hypertension, and hypothyroidism, now presenting to the emergency department for evaluation of abnormal outpatient lab.  Patient reports roughly 8 months of mild discomfort in the mid abdomen as well as progressive edema involving the bilateral lower extremities.  He has taken NSAID previously, but not recently.  He denies any recent vomiting or diarrhea.  Denies any flank pain or hematuria.  No fevers or chills.  Reports that he has not been taking his Synthroid, but takes metformin and lisinopril daily.  ED Course: Upon arrival to the ED, patient is found to be afebrile, saturating well on room air, and hypertensive.  Chemistry panel is notable for a creatinine of 3.11, up from 1.19 in May 2018.  CBC is notable for a mild normocytic anemia with hemoglobin of 11.1, down from 14.4 in May 2018.  Urinalysis features proteinuria and hyaline casts.  CT of the abdomen and pelvis is limited by respiratory motion, but with no definite intra-abdominal or pelvic pathology, and specifically normal appearance to kidneys and bladder without hydronephrosis.  Patient was given a liter of normal saline in the ED.  He will be observed for ongoing evaluation and management of renal failure of uncertain chronicity.  Review of Systems:  All other systems reviewed and apart from HPI, are negative.  Past Medical History:  Diagnosis Date  . 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 Chest pain 11/2013   Normal Echo/ EKG/enzymes-hospitalized.  Did not get outpatient stress testing following hospitalization.  No chest pain since  . Diabetes type 2, uncontrolled ([place]) 11/25/2011  . Diabetic gastroparesis ([place]) 01/22/2017  .  Microalbuminuria 01/19/2017    Past Surgical History:  Procedure Laterality Date  . EYE SURGERY     cataracts  . NECK SURGERY       reports that he quit smoking about 33 years ago. His smoking use included cigarettes. He started smoking about 43 years ago. He has a 30.00 pack-year smoking history. He has never used smokeless tobacco. He reports that he does not drink alcohol or use drugs.  No Known Allergies  Family History  Problem Relation Age of Onset  . Heart disease Mother        cause of death--CHF?  . Diabetes Father   . Kidney disease Father        Dialysis for kidney failure  . Diabetes Sister   . Diabetes Brother      Prior to Admission medications   Medication Sig Start Date End Date Taking? Authorizing Provider  lisinopril (PRINIVIL,ZESTRIL) 10 MG tablet Take 10 mg by mouth daily. 03/27/18  Yes [provider]  metFORMIN (GLUCOPHAGE) 1000 MG tablet Take 1,000 mg by mouth 2 (two) times daily with a meal. 03/27/18  Yes [provider]  aspirin EC 81 MG tablet Take 1 tablet (81 mg total) by mouth daily. Patient not taking: Reported on 12/11/2016 07/12/16   Mack Hook, MD  cyclobenzaprine (FLEXERIL) 5 MG tablet 1-2 tabs by mouth every 6 hours as needed for back pain Patient not taking: Reported on 04/08/2018 07/10/17   Mack Hook, MD  glipiZIDE (GLUCOTROL) 5 MG tablet 1 tab by mouth twice daily with meals Patient not taking: Reported on 04/08/2018 07/12/16  Mack Hook, MD  levothyroxine (SYNTHROID, LEVOTHROID) 25 MCG tablet Take 1 tablet (25 mcg total) by mouth daily before breakfast. Patient not taking: Reported on 01/03/2017 12/11/16   Mack Hook, MD  meloxicam (MOBIC) 7.5 MG tablet Take 1 tablet (7.5 mg total) by mouth daily. Patient not taking: Reported on 01/03/2017 07/12/16   Mack Hook, MD  metoCLOPramide (REGLAN) 10 MG tablet 1/2 tab by mouth 4 times daily before meals and at bedtime Patient not taking:  Reported on 07/10/2017 12/11/16   Mack Hook, MD    Physical Exam: Vitals:   04/08/18 0215 04/08/18 0230 04/08/18 0308 04/08/18 0330  BP: (!) 200/94 (!) 179/85 (!) 179/86 (!) 173/86  Pulse: 73 69 66 73  Resp:   14   Temp:      TempSrc:      SpO2: 100% 100% 100% 100%  Weight:      Height:          Constitutional: NAD, calm  Eyes: PERTLA, lids and conjunctivae normal ENMT: Mucous membranes are moist. Posterior pharynx clear of any exudate or lesions.   Neck: normal, supple, no masses, no thyromegaly Respiratory: clear to auscultation bilaterally, no wheezing, no crackles. Normal respiratory effort.    Cardiovascular: S1 & S2 heard, regular rate and rhythm. Pretibial pitting edema bilaterally. Abdomen: No distension, soft, mild tenderness in mid and lower abdomen, no rebound pain or guarding. Bowel sounds active.  Musculoskeletal: no clubbing / cyanosis. No joint deformity upper and lower extremities.   Skin: no significant rashes, lesions, ulcers. Warm, dry, well-perfused. Neurologic: No facial asymmetry. Sensation intact. Strength 5/5 in all 4 limbs.  Psychiatric: Alert and oriented x 3. Calm, cooperative.     Labs on Admission: I have personally reviewed following labs and imaging studies  CBC: Recent Labs  Lab 04/07/18 2227  WBC 7.6  HGB 11.1*  HCT 34.4*  MCV 90.3  PLT 237   Basic Metabolic Panel: Recent Labs  Lab 04/07/18 2227  NA 141  K 5.1  CL 109  CO2 24  GLUCOSE 115*  BUN 40*  CREATININE 3.11*  CALCIUM 8.5*   GFR: Estimated Creatinine Clearance: 20.6 mL/min (A) (by C-G formula based on SCr of 3.11 mg/dL (H)). Liver Function Tests: Recent Labs  Lab 04/07/18 2227  AST 19  ALT 11  ALKPHOS 126  BILITOT 0.6  PROT 5.9*  ALBUMIN 3.0*   Recent Labs  Lab 04/07/18 2227  LIPASE 40   No results for input(s): AMMONIA in the last 168 hours. Coagulation Profile: No results for input(s): INR, PROTIME in the last 168 hours. Cardiac  Enzymes: No results for input(s): CKTOTAL, CKMB, CKMBINDEX, TROPONINI in the last 168 hours. BNP (last 3 results) No results for input(s): PROBNP in the last 8760 hours. HbA1C: No results for input(s): HGBA1C in the last 72 hours. CBG: No results for input(s): GLUCAP in the last 168 hours. Lipid Profile: No results for input(s): CHOL, HDL, LDLCALC, TRIG, CHOLHDL, LDLDIRECT in the last 72 hours. Thyroid Function Tests: No results for input(s): TSH, T4TOTAL, FREET4, T3FREE, THYROIDAB in the last 72 hours. Anemia Panel: No results for input(s): VITAMINB12, FOLATE, FERRITIN, TIBC, IRON, RETICCTPCT in the last 72 hours. Urine analysis:    Component Value Date/Time   COLORURINE YELLOW 04/07/2018 [place] 04/07/2018 2230   LABSPEC 1.015 04/07/2018 2230   PHURINE 7.0 04/07/2018 2230   GLUCOSEU 150 (A) 04/07/2018 2230   HGBUR NEGATIVE 04/07/2018 [place] 04/07/2018 2230  BILIRUBINUR neg 12/19/2016 1512   KETONESUR NEGATIVE 04/07/2018 2230   PROTEINUR >=300 (A) 04/07/2018 2230   UROBILINOGEN 0.2 12/19/2016 1512   UROBILINOGEN 1.0 03/29/2010 1944   NITRITE NEGATIVE 04/07/2018 2230   LEUKOCYTESUR NEGATIVE 04/07/2018 2230   Sepsis Labs: @LABRCNTIP (procalcitonin:4,lacticidven:4) )No results found for this or any previous visit (from the past 240 hour(s)).   Radiological Exams on Admission: Ct Abdomen Pelvis Wo Contrast  Result Date: 04/08/2018 CLINICAL DATA:  62 year old male with abdominal pain. EXAM: CT ABDOMEN AND PELVIS WITHOUT CONTRAST TECHNIQUE: Multidetector CT imaging of the abdomen and pelvis was performed following the standard protocol without IV contrast. COMPARISON:  Abdominal radiograph dated 07/30/2012 FINDINGS: Evaluation of this exam is limited in the absence of intravenous contrast. Evaluation is also limited due to respiratory motion artifact. Lower chest: The visualized lung bases are clear. No intra-abdominal free air or free fluid.  Hepatobiliary: No focal liver abnormality is seen. No gallstones, gallbladder wall thickening, or biliary dilatation. Pancreas: The pancreas is unremarkable. A 3.3 x 2.3 cm ill-defined structure inferior to the tail of the pancreas (series 3, image 20 and coronal series 6, image 51) is not well characterized but most likely represents artifact from respiratory motion. A small amount of fluid is less likely. Correlation with clinical exam and pancreatic enzymes recommended to exclude pancreatitis. Spleen: Normal in size without focal abnormality. Adrenals/Urinary Tract: Adrenal glands are unremarkable. Kidneys are normal, without renal calculi, focal lesion, or hydronephrosis. Bladder is unremarkable. Stomach/Bowel: There is moderate stool throughout the colon. There is no bowel obstruction or active inflammation. Normal appendix. Vascular/Lymphatic: The abdominal aorta and IVC are grossly unremarkable on this noncontrast CT. No portal venous gas. There is no adenopathy. Reproductive: The prostate and seminal vesicles are grossly unremarkable. No pelvic mass. Other: Mild diffuse subcutaneous edema in the pelvis. No fluid collection. Musculoskeletal: Mild degenerative changes of the spine with disc desiccation and posterior disc bulge at L5-S1. Multilevel facet hypertrophy. No acute osseous pathology. IMPRESSION: 1. No definite acute intra-abdominal or pelvic pathology. Evaluation is however limited due to respiratory motion. Hypoattenuating area inferior to the tail of the pancreas, likely artifactual. Correlation with pancreatic enzymes recommended to exclude pancreatitis. 2. No bowel obstruction or active inflammation.  Normal appendix. Electronically Signed   By: Anner Crete M.D.   On: 04/08/2018 03:03    EKG: Not performed.   Assessment/Plan  1. Kidney disease of uncertain chronicity  - Presents for evaluation of abnormal outpatient labs, reports ~8 months of intermittent discomfort in mid-abdomen  and bilateral leg swelling over the same interval, but no acute complaints  - He is found to have serum creatinine of 3.11, up from 1.19 in May 2019  - Potassium and bicarb are normal, BUN 40, and there is peripheral edema but no dyspnea or crackles on exam  - CT abd/pelvis with normal appearance to kidneys and no evidence for obstruction  - UA with proteinuria and hyaline casts, no RBC's  - Possibly related to poorly controlled HTN and DM, exacerbated by medications  - Check urine chemistries, albumin to creatinine ratio, continue gentle IVF hydration, renally-dose medications, avoid nephrotoxins    2. Hypertension  - BP elevated in ED  - Managed with lisinopril at home, held in light of renal failure  - Use hydralazine IVP's prn for now    3. Type II DM  - A1c was >15/5% in December 2017  - He has been taking metformin at home, held on admission  - Check CBG's and  use a SSI with Novolog as needed for now  4. Hypothyroidism  - TSH was 10.940 in May 2019  - He has not been taking his Synthroid, will resume and check TSH   5. Normocytic anemia  - Hgb is 11.0 on admission, down from 14.4 in May 2018  - No bleeding, possibly secondary to CKD    DVT prophylaxis: sq heparin  Code Status: Full  Family Communication: Discussed with patient  Consults called: None Admission status: Observation     Vianne Bulls, MD [facility] Pager [phone]  If 7PM-7AM, please contact night-coverage [email or web address] Password [facility]  04/08/2018, 4:39 AM

## 2018-04-08 NOTE — ED Notes (Signed)
Patient transported to CT 

## 2018-04-08 NOTE — ED Provider Notes (Signed)
[facility] EMERGENCY DEPARTMENT Provider Note   CSN: [id number] Arrival date & time: 04/07/18  2139     History   Chief Complaint Chief Complaint  Patient presents with  . Abdominal Pain    HPI Ryan Wells is a 62 y.o. male.  Patient is a 63 year old male with past medical history of type 2 diabetes, diabetic gastroparesis.  He presents today for evaluation of abdominal pain.  He reports a several month history of intermittent discomfort throughout his abdomen.  He went and saw his primary doctor last week.  They called him yesterday and told him to come to the ER because of an abnormal laboratory value.  He is uncertain as to what this test was, however believes it was because his "acid was high".  He denies fevers or chills.  He denies bloody stool or vomit.  He does report increased swelling in his feet and ankles.  This patient speaks very little [place], mainly [place].  History was taken with the assistance of the translator tablet.  The history is provided by the patient.  Abdominal Pain   This is a new problem. Episode onset: Several months ago. The problem occurs constantly. The problem has been gradually worsening. The pain is associated with an unknown factor. The pain is located in the epigastric region and periumbilical region. The pain is moderate. Pertinent negatives include fever and melena. Nothing aggravates the symptoms. Nothing relieves the symptoms.    Past Medical History:  Diagnosis Date  . 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 Chest pain 11/2013   Normal Echo/ EKG/enzymes-hospitalized.  Did not get outpatient stress testing following hospitalization.  No chest pain since  . Diabetes type 2, uncontrolled ([place]) 11/25/2011  . Diabetic gastroparesis ([place]) 01/22/2017  . Microalbuminuria 01/19/2017    Patient Active Problem List   Diagnosis Date Noted  . Diabetic gastroparesis ([place]) 01/22/2017  . Microalbuminuria 01/19/2017  .  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 Long term use of drug 07/29/2016  . Joint pain 07/12/2016  . Hypothyroidism 07/12/2014  . Chest pain 11/27/2013  . Diabetes type 2, uncontrolled ([place]) 11/25/2011    Past Surgical History:  Procedure Laterality Date  . EYE SURGERY     cataracts  . NECK SURGERY          Home Medications    Prior to Admission medications   Medication Sig Start Date End Date Taking? Authorizing Provider  aspirin EC 81 MG tablet Take 1 tablet (81 mg total) by mouth daily. Patient not taking: Reported on 12/11/2016 07/12/16   Mack Hook, MD  cyclobenzaprine (FLEXERIL) 5 MG tablet 1-2 tabs by mouth every 6 hours as needed for back pain 07/10/17   Mack Hook, MD  glipiZIDE (GLUCOTROL) 5 MG tablet 1 tab by mouth twice daily with meals 07/12/16   Mack Hook, MD  levothyroxine (SYNTHROID, LEVOTHROID) 25 MCG tablet Take 1 tablet (25 mcg total) by mouth daily before breakfast. Patient not taking: Reported on 01/03/2017 12/11/16   Mack Hook, MD  lisinopril (PRINIVIL,ZESTRIL) 5 MG tablet 1/2 tab by mouth daily Patient not taking: Reported on 07/10/2017 07/17/16   Mack Hook, MD  meloxicam (MOBIC) 7.5 MG tablet Take 1 tablet (7.5 mg total) by mouth daily. Patient not taking: Reported on 01/03/2017 07/12/16   Mack Hook, MD  metFORMIN (GLUCOPHAGE-XR) 500 MG 24 hr tablet 2 tabs by mouth in morning with breakfast 07/07/17   Mack Hook, MD  metoCLOPramide (REGLAN) 10 MG  tablet 1/2 tab by mouth 4 times daily before meals and at bedtime Patient not taking: Reported on 07/10/2017 12/11/16   Mack Hook, MD    Family History Family History  Problem Relation Age of Onset  . Heart disease Mother        cause of death--CHF?  . Diabetes Father   . Kidney disease Father        Dialysis for kidney failure  . Diabetes Sister   . Diabetes Brother     Social History Social History   Tobacco Use  . Smoking  status: Former Smoker    Packs/day: 3.00    Years: 10.00    Pack years: 30.00    Types: Cigarettes    Start date: 08/17/1974    Last attempt to quit: 08/17/1984    Years since quitting: 33.6  . Smokeless tobacco: Never Used  Substance Use Topics  . Alcohol use: No  . Drug use: No     Allergies   Patient has no known allergies.   Review of Systems Review of Systems  Constitutional: Negative for fever.  Gastrointestinal: Positive for abdominal pain. Negative for melena.  All other systems reviewed and are negative.    Physical Exam Updated Vital Signs BP (!) 200/94   Pulse 73   Temp 98.8 F (37.1 C) (Oral)   Resp 16   Ht 5\' 2"  (1.575 m)   Wt 68 kg   SpO2 100%   BMI 27.44 kg/m   Physical Exam  Constitutional: He is oriented to person, place, and time. He appears well-developed and well-nourished. No distress.  HENT:  Head: Normocephalic and atraumatic.  Mouth/Throat: Oropharynx is clear and moist.  Neck: Normal range of motion. Neck supple.  Cardiovascular: Normal rate and regular rhythm. Exam reveals no friction rub.  No murmur heard. Pulmonary/Chest: Effort normal and breath sounds normal. No respiratory distress. He has no wheezes. He has no rales.  Abdominal: Soft. Bowel sounds are normal. He exhibits no distension. There is tenderness in the periumbilical area. There is no rigidity, no rebound, no guarding and no CVA tenderness.  Musculoskeletal: Normal range of motion. He exhibits no edema.  Neurological: He is alert and oriented to person, place, and time. Coordination normal.  Skin: Skin is warm and dry. He is not diaphoretic.  There is 2+ pitting edema of both lower extremities.  Nursing note and vitals reviewed.    ED Treatments / Results  Labs (all labs ordered are listed, but only abnormal results are displayed) Labs Reviewed  COMPREHENSIVE METABOLIC PANEL - Abnormal; Notable for the following components:      Result Value   Glucose, Bld 115 (*)      BUN 40 (*)    Creatinine, Ser 3.11 (*)    Calcium 8.5 (*)    Total Protein 5.9 (*)    Albumin 3.0 (*)    GFR calc non Af Amer 20 (*)    GFR calc Af Amer 23 (*)    All other components within normal limits  CBC - Abnormal; Notable for the following components:   RBC 3.81 (*)    Hemoglobin 11.1 (*)    HCT 34.4 (*)    All other components within normal limits  URINALYSIS, ROUTINE W REFLEX MICROSCOPIC - Abnormal; Notable for the following components:   Glucose, UA 150 (*)    Protein, ur >=300 (*)    Bacteria, UA RARE (*)    All other components within normal limits  LIPASE, BLOOD  EKG None  Radiology No results found.  Procedures Procedures (including critical care time)  Medications Ordered in ED Medications  sodium chloride 0.9 % bolus 1,000 mL (has no administration in time range)     Initial Impression / Assessment and Plan / ED Course  I have reviewed the triage vital signs and the nursing notes.  Pertinent labs & imaging results that were available during my care of the patient were reviewed by me and considered in my medical decision making (see chart for details).  Patient referred here by his primary care doctor for evaluation of abnormal laboratory studies.  He was told that he had a "high acid level" at his doctor's office.  Laboratory studies today reveal a creatinine of 3.1 with BUN of 40.  This is a significant increase from his baseline.  A CT scan was obtained which reveals no evidence for acute intra-abdominal pathology.  Patient will be admitted to the hospitalist service for further work-up and investigation into his acute renal failure.  I have discussed the patient with Dr. Myna Hidalgo who agrees to admit.  Final Clinical Impressions(s) / ED Diagnoses   Final diagnoses:  None    ED Discharge Orders    None       Veryl Speak, MD 04/08/18 [phone]

## 2018-04-08 NOTE — ED Notes (Signed)
iPad translator at bedside.

## 2018-04-09 ENCOUNTER — Inpatient Hospital Stay (HOSPITAL_COMMUNITY): Payer: Medicare Other

## 2018-04-09 ENCOUNTER — Other Ambulatory Visit: Payer: Self-pay

## 2018-04-09 DIAGNOSIS — N184 Chronic kidney disease, stage 4 (severe): Secondary | ICD-10-CM | POA: Diagnosis present

## 2018-04-09 DIAGNOSIS — I10 Essential (primary) hypertension: Secondary | ICD-10-CM | POA: Diagnosis not present

## 2018-04-09 DIAGNOSIS — E039 Hypothyroidism, unspecified: Secondary | ICD-10-CM | POA: Diagnosis present

## 2018-04-09 DIAGNOSIS — Z79890 Hormone replacement therapy (postmenopausal): Secondary | ICD-10-CM | POA: Diagnosis not present

## 2018-04-09 DIAGNOSIS — E1122 Type 2 diabetes mellitus with diabetic chronic kidney disease: Secondary | ICD-10-CM | POA: Diagnosis present

## 2018-04-09 DIAGNOSIS — I674 Hypertensive encephalopathy: Secondary | ICD-10-CM | POA: Diagnosis present

## 2018-04-09 DIAGNOSIS — E8889 Other specified metabolic disorders: Secondary | ICD-10-CM | POA: Diagnosis present

## 2018-04-09 DIAGNOSIS — D649 Anemia, unspecified: Secondary | ICD-10-CM | POA: Diagnosis not present

## 2018-04-09 DIAGNOSIS — E875 Hyperkalemia: Secondary | ICD-10-CM | POA: Diagnosis present

## 2018-04-09 DIAGNOSIS — Z833 Family history of diabetes mellitus: Secondary | ICD-10-CM | POA: Diagnosis not present

## 2018-04-09 DIAGNOSIS — E119 Type 2 diabetes mellitus without complications: Secondary | ICD-10-CM | POA: Diagnosis not present

## 2018-04-09 DIAGNOSIS — Z7982 Long term (current) use of aspirin: Secondary | ICD-10-CM | POA: Diagnosis not present

## 2018-04-09 DIAGNOSIS — D631 Anemia in chronic kidney disease: Secondary | ICD-10-CM | POA: Diagnosis present

## 2018-04-09 DIAGNOSIS — Z841 Family history of disorders of kidney and ureter: Secondary | ICD-10-CM | POA: Diagnosis not present

## 2018-04-09 DIAGNOSIS — H4900 Third [oculomotor] nerve palsy, unspecified eye: Secondary | ICD-10-CM | POA: Diagnosis present

## 2018-04-09 DIAGNOSIS — N179 Acute kidney failure, unspecified: Secondary | ICD-10-CM | POA: Diagnosis present

## 2018-04-09 DIAGNOSIS — Z8249 Family history of ischemic heart disease and other diseases of the circulatory system: Secondary | ICD-10-CM | POA: Diagnosis not present

## 2018-04-09 DIAGNOSIS — E1121 Type 2 diabetes mellitus with diabetic nephropathy: Secondary | ICD-10-CM | POA: Diagnosis present

## 2018-04-09 DIAGNOSIS — E1143 Type 2 diabetes mellitus with diabetic autonomic (poly)neuropathy: Secondary | ICD-10-CM | POA: Diagnosis present

## 2018-04-09 DIAGNOSIS — E1142 Type 2 diabetes mellitus with diabetic polyneuropathy: Secondary | ICD-10-CM | POA: Diagnosis present

## 2018-04-09 DIAGNOSIS — Z87891 Personal history of nicotine dependence: Secondary | ICD-10-CM | POA: Diagnosis not present

## 2018-04-09 DIAGNOSIS — Z9849 Cataract extraction status, unspecified eye: Secondary | ICD-10-CM | POA: Diagnosis not present

## 2018-04-09 DIAGNOSIS — Z7984 Long term (current) use of oral hypoglycemic drugs: Secondary | ICD-10-CM | POA: Diagnosis not present

## 2018-04-09 DIAGNOSIS — K3184 Gastroparesis: Secondary | ICD-10-CM | POA: Diagnosis present

## 2018-04-09 DIAGNOSIS — Z79899 Other long term (current) drug therapy: Secondary | ICD-10-CM | POA: Diagnosis not present

## 2018-04-09 DIAGNOSIS — E1165 Type 2 diabetes mellitus with hyperglycemia: Secondary | ICD-10-CM | POA: Diagnosis present

## 2018-04-09 DIAGNOSIS — I129 Hypertensive chronic kidney disease with stage 1 through stage 4 chronic kidney disease, or unspecified chronic kidney disease: Secondary | ICD-10-CM | POA: Diagnosis present

## 2018-04-09 LAB — MICROALBUMIN / CREATININE URINE RATIO
Creatinine, Urine: 30.5 mg/dL
Microalb Creat Ratio: 5938.7 mg/g creat — ABNORMAL HIGH (ref 0.0–30.0)
Microalb, Ur: 1811.3 ug/mL — ABNORMAL HIGH

## 2018-04-09 LAB — BASIC METABOLIC PANEL
Anion gap: 5 (ref 5–15)
BUN: 47 mg/dL — ABNORMAL HIGH (ref 8–23)
CO2: 22 mmol/L (ref 22–32)
Calcium: 8 mg/dL — ABNORMAL LOW (ref 8.9–10.3)
Chloride: 114 mmol/L — ABNORMAL HIGH (ref 98–111)
Creatinine, Ser: 3.15 mg/dL — ABNORMAL HIGH (ref 0.61–1.24)
GFR calc Af Amer: 23 mL/min — ABNORMAL LOW (ref >60)
GFR calc non Af Amer: 20 mL/min — ABNORMAL LOW (ref >60)
Glucose, Bld: 116 mg/dL — ABNORMAL HIGH (ref 70–99)
Potassium: 5.5 mmol/L — ABNORMAL HIGH (ref 3.5–5.1)
Sodium: 141 mmol/L (ref 135–145)

## 2018-04-09 LAB — CBC
HCT: 30.2 % — ABNORMAL LOW (ref 39.0–52.0)
Hemoglobin: 10 g/dL — ABNORMAL LOW (ref 13.0–17.0)
MCH: 29.7 pg (ref 26.0–34.0)
MCHC: 33.1 g/dL (ref 30.0–36.0)
MCV: 89.6 fL (ref 78.0–100.0)
Platelets: 163 10*3/uL (ref 150–400)
RBC: 3.37 MIL/uL — ABNORMAL LOW (ref 4.22–5.81)
RDW: 12.4 % (ref 11.5–15.5)
WBC: 6.2 10*3/uL (ref 4.0–10.5)

## 2018-04-09 LAB — UREA NITROGEN, URINE: Urea Nitrogen, Ur: 230 mg/dL

## 2018-04-09 LAB — GLUCOSE, CAPILLARY
Glucose-Capillary: 92 mg/dL (ref 70–99)
Glucose-Capillary: 95 mg/dL (ref 70–99)
Glucose-Capillary: 88 mg/dL (ref 70–99)
Glucose-Capillary: 89 mg/dL (ref 70–99)

## 2018-04-09 LAB — HEMOGLOBIN A1C
Hgb A1c MFr Bld: 6.4 % — ABNORMAL HIGH (ref 4.8–5.6)
Mean Plasma Glucose: 136.98 mg/dL

## 2018-04-09 LAB — PHOSPHORUS: Phosphorus: 4.4 mg/dL (ref 2.5–4.6)

## 2018-04-09 MED ORDER — AMLODIPINE BESYLATE 10 MG PO TABS
10.0000 mg | ORAL_TABLET | Freq: Every day | ORAL | Status: DC
  Administered 2018-04-09 – 2018-04-11 (×3): 10 mg via ORAL
  Filled 2018-04-09 (×3): qty 1

## 2018-04-09 NOTE — Progress Notes (Signed)
Patient was bladder scanned by NT and his bladder scan showed: 125mL. NT also noted he did urinate in his urinal as well. Will continue to monitor.   Farley Ly RN

## 2018-04-09 NOTE — Consult Note (Addendum)
Reason for Consult:CKD Referring Physician: Jihad Brownlow is an 62 y.o. male. W/PMH significant for T2DM, HTN, CKD  HPI: Patient reports he has had trouble seeing a physician regularly due to financial/insurance difficulties.  He says he knows that his blood pressure has been high for at least a year along with having thyroid disease, but has not been on medication for this.  He reports the only medication he has taken intermittently over the last year has been metformin.  He does not check blood sugars at home.  He denies any over the counter medications including NSAIDS or supplements.  He denies any difficulty with urination mentions he has a strong stream especially first thing in the am.  He has no burning with urination.  Has some neuropathy symptoms in hands and feet.  Has had diabetes for at least 20 years, appears has been mostly poorly controlled.     Past Medical History:  Diagnosis Date  . 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 Chest pain 11/2013   Normal Echo/ EKG/enzymes-hospitalized.  Did not get outpatient stress testing following hospitalization.  No chest pain since  . Diabetes type 2, uncontrolled ([place]) 11/25/2011  . Diabetic gastroparesis ([place]) 01/22/2017  . Microalbuminuria 01/19/2017    Past Surgical History:  Procedure Laterality Date  . EYE SURGERY     cataracts  . NECK SURGERY      Family History  Problem Relation Age of Onset  . Heart disease Mother        cause of death--CHF?  . Diabetes Father   . Kidney disease Father        Dialysis for kidney failure  . Diabetes Sister   . Diabetes Brother     Social History:  reports that he quit smoking about 33 years ago. His smoking use included cigarettes. He started smoking about 43 years ago. He has a 30.00 pack-year smoking history. He has never used smokeless tobacco. He reports that he does not drink alcohol or use drugs.  Allergies: No Known Allergies   Results for orders placed or  performed during the hospital encounter of 04/07/18 (from the past 48 hour(s))  Lipase, blood     Status: None   Collection Time: 04/07/18 10:27 PM  Result Value Ref [place]   Lipase 40 11 - 51 U/L    Comment: Performed at [facility], [place] [address], [place], [place] [address]  Comprehensive metabolic panel     Status: Abnormal   Collection Time: 04/07/18 10:27 PM  Result Value Ref Range   Sodium 141 135 - 145 mmol/L   Potassium 5.1 3.5 - 5.1 mmol/L   Chloride 109 98 - 111 mmol/L   CO2 24 22 - 32 mmol/L   Glucose, Bld 115 (H) 70 - 99 mg/dL   BUN 40 (H) 8 - 23 mg/dL   Creatinine, Ser 3.11 (H) 0.61 - 1.24 mg/dL   Calcium 8.5 (L) 8.9 - 10.3 mg/dL   Total Protein 5.9 (L) 6.5 - 8.1 g/dL   Albumin 3.0 (L) 3.5 - 5.0 g/dL   AST 19 15 - 41 U/L   ALT 11 0 - 44 U/L   Alkaline Phosphatase 126 38 - 126 U/L   Total Bilirubin 0.6 0.3 - 1.2 mg/dL   GFR calc non Af Amer 20 (L) >60 mL/min   GFR calc Af Amer 23 (L) >60 mL/min    Comment: (NOTE) The eGFR has been calculated using the CKD EPI  equation. This calculation has not been validated in all clinical situations. eGFR's persistently <60 mL/min signify possible Chronic Kidney Disease.    Anion gap 8 5 - 15    Comment: Performed at [facility] [address] [place], [place] [address]  CBC     Status: Abnormal   Collection Time: 04/07/18 10:27 PM  Result Value Ref Range   WBC 7.6 4.0 - 10.5 K/uL   RBC 3.81 (L) 4.22 - 5.81 MIL/uL   Hemoglobin 11.1 (L) 13.0 - 17.0 g/dL   HCT 34.4 (L) 39.0 - 52.0 %   MCV 90.3 78.0 - 100.0 fL   MCH 29.1 26.0 - 34.0 pg   MCHC 32.3 30.0 - 36.0 g/dL   RDW 12.4 11.5 - 15.5 %   Platelets 192 150 - 400 K/uL    Comment: Performed at [facility] [address], [place], [place] [address]  Urinalysis, Routine w reflex microscopic     Status: Abnormal   Collection Time: 04/07/18 10:30 PM  Result Value Ref [place]   Color, Urine YELLOW YELLOW   APPearance CLEAR CLEAR   Specific Gravity, Urine  1.015 1.005 - 1.030   pH 7.0 5.0 - 8.0   Glucose, UA 150 (A) NEGATIVE mg/dL   Hgb urine dipstick NEGATIVE NEGATIVE   Bilirubin Urine NEGATIVE NEGATIVE   Ketones, ur NEGATIVE NEGATIVE mg/dL   Protein, ur >=300 (A) NEGATIVE mg/dL   Nitrite NEGATIVE NEGATIVE   Leukocytes, UA NEGATIVE NEGATIVE   RBC / HPF 6-10 0 - 5 RBC/hpf   WBC, UA 0-5 0 - 5 WBC/hpf   Bacteria, UA RARE (A) NONE SEEN   Squamous Epithelial / LPF 0-5 0 - 5   Mucus PRESENT    Hyaline Casts, UA PRESENT     Comment: Performed at [facility] [address], [place], [place] [address]  HIV antibody (Routine Testing)     Status: None   Collection Time: 04/08/18  6:54 AM  Result Value Ref Range   HIV Screen 4th Generation wRfx Non Reactive Non Reactive    Comment: (NOTE) Performed At: [facility] [place], [place] [id number] Rush Farmer MD [id number]   TSH     Status: Abnormal   Collection Time: 04/08/18  6:54 AM  Result Value Ref Range   TSH 16.498 (H) 0.350 - 4.500 uIU/mL    Comment: Performed by a 3rd Generation assay with a functional sensitivity of <=0.01 uIU/mL. Performed at [facility], [place] [address] [place], [place] [address]   Basic metabolic panel     Status: Abnormal   Collection Time: 04/08/18  6:54 AM  Result Value Ref [place]   Sodium 140 135 - 145 mmol/L   Potassium 5.2 (H) 3.5 - 5.1 mmol/L   Chloride 113 (H) 98 - 111 mmol/L   CO2 19 (L) 22 - 32 mmol/L   Glucose, Bld 85 70 - 99 mg/dL   BUN 39 (H) 8 - 23 mg/dL   Creatinine, Ser 2.83 (H) 0.61 - 1.24 mg/dL   Calcium 8.0 (L) 8.9 - 10.3 mg/dL   GFR calc non Af Amer 22 (L) >60 mL/min   GFR calc Af Amer 26 (L) >60 mL/min    Comment: (NOTE) The eGFR has been calculated using the CKD EPI equation. This calculation has not been validated in all clinical situations. eGFR's persistently <60 mL/min signify possible Chronic Kidney Disease.    Anion gap 8 5 - 15  Comment: Performed at [facility], [place] [address], [place], [place] [address]  Glucose, capillary     Status: None   Collection Time: 04/08/18  7:29 AM  Result Value Ref Range   Glucose-Capillary 90 70 - 99 mg/dL  T4, free     Status: None   Collection Time: 04/08/18 10:51 AM  Result Value Ref [place]   Free T4 0.91 0.82 - 1.77 ng/dL    Comment: (NOTE) Biotin ingestion may interfere with free T4 tests. If the results are inconsistent with the TSH level, previous test results, or the clinical presentation, then consider biotin interference. If needed, order repeat testing after stopping biotin. Performed at [facility], [place] [address] [place], [place] [address]   Vitamin B12     Status: None   Collection Time: 04/08/18 10:51 AM  Result Value Ref [place]   Vitamin B-12 229 180 - 914 pg/mL    Comment: (NOTE) This assay is not validated for testing neonatal or myeloproliferative syndrome specimens for Vitamin B12 levels. Performed at [facility], [place] [address] [place], [place] [address]   Folate     Status: None   Collection Time: 04/08/18 10:51 AM  Result Value Ref [place]   Folate 8.8 >5.9 ng/mL    Comment: Performed at [facility] [address], [place], [place] [address]  Iron and TIBC     Status: Abnormal   Collection Time: 04/08/18 10:51 AM  Result Value Ref [place]   Iron 79 45 - 182 ug/dL   TIBC 195 (L) 250 - 450 ug/dL   Saturation Ratios 41 (H) 17.9 - 39.5 %   UIBC 116 ug/dL    Comment: Performed at [facility], [place] [address] [place], [place] [address]  Ferritin     Status: None   Collection Time: 04/08/18 10:51 AM  Result Value Ref [place]   Ferritin 123 24 - 336 ng/mL    Comment: Performed at [facility], [place] [address], [place], [place] [address]  Reticulocytes     Status: Abnormal   Collection Time: 04/08/18 10:51 AM  Result Value Ref [place]   Retic Ct Pct 1.2 0.4 - 3.1 %   RBC. 3.54 (L) 4.22 - 5.81 MIL/uL   Retic Count, Absolute 42.5 19.0 - 186.0 K/uL    Comment:  Performed at [facility] [address], [place], [place] [address]  Glucose, capillary     Status: Abnormal   Collection Time: 04/08/18 11:24 AM  Result Value Ref [place]   Glucose-Capillary 145 (H) 70 - 99 mg/dL  Glucose, capillary     Status: None   Collection Time: 04/08/18  4:34 PM  Result Value Ref [place]   Glucose-Capillary 78 70 - 99 mg/dL  Creatinine, urine, random     Status: None   Collection Time: 04/08/18  4:36 PM  Result Value Ref [place]   Creatinine, Urine 32.20 mg/dL    Comment: Performed at [facility], [place] [address], [place], [place] [address]  Sodium, urine, random     Status: None   Collection Time: 04/08/18  4:36 PM  Result Value Ref [place]   Sodium, Ur 105 mmol/L    Comment: Performed at [facility] [address], [place], [place] [address]  Glucose, capillary     Status: Abnormal   Collection Time: 04/08/18  8:52 PM  Result Value Ref Range   Glucose-Capillary 130 (H)  70 - 99 mg/dL  Hemoglobin A1c     Status: Abnormal   Collection Time: 04/09/18  4:33 AM  Result Value Ref [place]   Hgb A1c MFr Bld 6.4 (H) 4.8 - 5.6 %    Comment: (NOTE) Pre diabetes:          5.7%-6.4% Diabetes:              >6.4% Glycemic control for   <7.0% adults with diabetes    Mean Plasma Glucose 136.98 mg/dL    Comment: Performed at [facility] [address], [place], [place] [address]  CBC     Status: Abnormal   Collection Time: 04/09/18  4:33 AM  Result Value Ref Range   WBC 6.2 4.0 - 10.5 K/uL   RBC 3.37 (L) 4.22 - 5.81 MIL/uL   Hemoglobin 10.0 (L) 13.0 - 17.0 g/dL   HCT 30.2 (L) 39.0 - 52.0 %   MCV 89.6 78.0 - 100.0 fL   MCH 29.7 26.0 - 34.0 pg   MCHC 33.1 30.0 - 36.0 g/dL   RDW 12.4 11.5 - 15.5 %   Platelets 163 150 - 400 K/uL    Comment: Performed at [facility], [place] [address], [place], [place] [address]  Basic metabolic panel     Status: Abnormal   Collection Time: 04/09/18  4:33 AM  Result Value Ref [place]   Sodium 141 135 - 145  mmol/L   Potassium 5.5 (H) 3.5 - 5.1 mmol/L   Chloride 114 (H) 98 - 111 mmol/L   CO2 22 22 - 32 mmol/L   Glucose, Bld 116 (H) 70 - 99 mg/dL   BUN 47 (H) 8 - 23 mg/dL   Creatinine, Ser 3.15 (H) 0.61 - 1.24 mg/dL   Calcium 8.0 (L) 8.9 - 10.3 mg/dL   GFR calc non Af Amer 20 (L) >60 mL/min   GFR calc Af Amer 23 (L) >60 mL/min    Comment: (NOTE) The eGFR has been calculated using the CKD EPI equation. This calculation has not been validated in all clinical situations. eGFR's persistently <60 mL/min signify possible Chronic Kidney Disease.    Anion gap 5 5 - 15    Comment: Performed at [facility] [address], [place], [place] [address]  Glucose, capillary     Status: None   Collection Time: 04/09/18  7:17 AM  Result Value Ref Range   Glucose-Capillary 92 70 - 99 mg/dL    Ct Abdomen Pelvis Wo Contrast  Result Date: 04/08/2018 CLINICAL DATA:  62 year old male with abdominal pain. EXAM: CT ABDOMEN AND PELVIS WITHOUT CONTRAST TECHNIQUE: Multidetector CT imaging of the abdomen and pelvis was performed following the standard protocol without IV contrast. COMPARISON:  Abdominal radiograph dated 07/30/2012 FINDINGS: Evaluation of this exam is limited in the absence of intravenous contrast. Evaluation is also limited due to respiratory motion artifact. Lower chest: The visualized lung bases are clear. No intra-abdominal free air or free fluid. Hepatobiliary: No focal liver abnormality is seen. No gallstones, gallbladder wall thickening, or biliary dilatation. Pancreas: The pancreas is unremarkable. A 3.3 x 2.3 cm ill-defined structure inferior to the tail of the pancreas (series 3, image 20 and coronal series 6, image 51) is not well characterized but most likely represents artifact from respiratory motion. A small amount of fluid is less likely. Correlation with clinical exam and pancreatic enzymes recommended to exclude pancreatitis. Spleen: Normal in size without focal abnormality.  Adrenals/Urinary Tract: Adrenal glands are  unremarkable. Kidneys are normal, without renal calculi, focal lesion, or hydronephrosis. Bladder is unremarkable. Stomach/Bowel: There is moderate stool throughout the colon. There is no bowel obstruction or active inflammation. Normal appendix. Vascular/Lymphatic: The abdominal aorta and IVC are grossly unremarkable on this noncontrast CT. No portal venous gas. There is no adenopathy. Reproductive: The prostate and seminal vesicles are grossly unremarkable. No pelvic mass. Other: Mild diffuse subcutaneous edema in the pelvis. No fluid collection. Musculoskeletal: Mild degenerative changes of the spine with disc desiccation and posterior disc bulge at L5-S1. Multilevel facet hypertrophy. No acute osseous pathology. IMPRESSION: 1. No definite acute intra-abdominal or pelvic pathology. Evaluation is however limited due to respiratory motion. Hypoattenuating area inferior to the tail of the pancreas, likely artifactual. Correlation with pancreatic enzymes recommended to exclude pancreatitis. 2. No bowel obstruction or active inflammation.  Normal appendix. Electronically Signed   By: Anner Crete M.D.   On: 04/08/2018 03:03    Review of Systems  Respiratory: Negative for shortness of breath and wheezing.   Cardiovascular: Negative for chest pain and palpitations.  Genitourinary: Negative for dysuria and urgency.  Skin: Negative for rash.   Blood pressure (!) 164/81, pulse 66, temperature 97.6 F (36.4 C), temperature source Oral, resp. rate 18, height '5\' 2"'$  (1.575 m), weight 68 kg, SpO2 99 %.   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  Also an element of hypertensive  nephropathy likely contributing.  Medication review negative for causal agents, urine studies, clinical picture and labs not consistent with autoimmune or other processes. No signs of obstruction or symptoms of BPH.  -will get renal US -ordered protein/cr ratio -bp control, blood sugar control  2 HTN: uncontrolled for a long period of time now.  Not taking lisinopril.    -will avoid ace/arb for now with hyperkalemia -start amlodipine '10mg'$  -will add diuretic tomorrow for fluid/bp control  3 Normocytic Anemia ACD: Does not appear to be iron deficient, B12 and folate normal, pt not symptomatic, Hgb 10, no indication for aranesp   4. Metabolic Bone Disease: ordered PTH, phos, vit D   Brandon Ulysee Fyock 04/09/2018, 11:18 AM

## 2018-04-09 NOTE — Progress Notes (Deleted)
PROGRESS NOTE    Ryan Wells  TZG:[id number] DOB: Jul 25, 1956 DOA: 04/07/2018 PCP: Mack Hook, MD    Brief Narrative 62 year old Hispanic male with a history of type diabetes, hypertension,  presenting to the emergency department due to  abnormal outpatient lab work.  On presentation, he was mildly  hypertensive, but his pulse is normal.  His creatinine was 3.11, 1.19 in 2018. His  hemoglobin was 11.1, down from 2.4 in May 2018, UA with proteinuria.  CT of the abdomen and was negative for any intra-abdominal pathology.  No hydronephrosis was seen.  He was admitted for observation and management of his abnormal labs.    Assessment & Plan:   Principal Problem:   Renal failure Active Problems:   Diabetes type 2, uncontrolled ([facility])   Hypothyroidism   Normocytic anemia   Hypertension    Acute  Kidney disease of uncertain chronicity : likely due to NSAIDS and ACE I   Admission creatinine was 3.11, currently 3.15 and  with new CMET pending  UA with proteinuria. GFR on admission 23    CT of the abdomen and pelvis  was negative for any intra-abdominal pathology.  No hydronephrosis was seen.  Patient denies dysuria, hematuria and is making urine.   U Cr , MIcroalbumin/Cr Urine ratio, Urine sodium, Urea Nitrogen in urine is pending  Lab Results  Component Value Date   CREATININE 3.15 (H) 04/09/2018   CREATININE 2.83 (H) 04/08/2018   CREATININE 3.11 (H) 04/07/2018  Continue IV fluids at 100 cc/h  Follow urine studies  Follow BMP daily  Avoid NSAIDS or ACE I    Hypertension BP  181/86   Pulse 60 patient was placed, which was held due to renal failure  Continue  hydralazine IV for now   Type II Diabetes Current blood sugar level is 116, A1C 14 in April, not drawn to date during this admission . Metformin held on presentation  Lab Results  Component Value Date   HGBA1C 6.4 (H) 04/09/2018   Hgb A1C Hold home oral diabetic medications.  SSI Check A1C    Hypothyroidism: Labs  show TSH 16.498, was 10 in 12/2017 . Patient symptomatic, cold intolerant and tired . Has not been taking Synthroid due to cost.  Will resume Synthroid at 25 mcg daily   Anemia of chronic disease (CKD) , normocytic, Hemoglobin on admission 11, down from 14 in 12/2017. No bleeding issues .BUN 40 on admission  Check Hemoccult  Repeat CBC in am  No transfusion is indicated at this time Anemia panel    DVT prophylaxis: sq heparin  Code Status: Full code  Family Communication:  None  Disposition Plan: Home likely in am  Consultants:   None    Procedures: None    Antimicrobials: None    Subjective: Feels very tired, "the room is cold" . Denies fevers, chills, night sweats  Denies any respiratory complaints. Denies any chest pain or palpitations. Denies lower extremity swelling. Denies nausea, heartburn or change in bowel habits. HIs abdominal discomfort still present but no frank pain  Appetite is normal. Denies any dysuria. Denies abnormal skin rashes, or neuropathy. Denies any bleeding issues  Ambulating without difficulty.    Objective: Vitals:   04/08/18 1554 04/08/18 2051 04/09/18 0435 04/09/18 0718  BP: (!) 176/94 (!) 155/85 (!) 160/79 (!) 164/81  Pulse: 63 66 63 66  Resp: 16 18 18    Temp: 98.1 F (36.7 C) 98.1 F (36.7 C) 98.4 F (36.9 C) 97.6 F (36.4 C)  TempSrc: Oral Oral Oral Oral  SpO2: 100% 100% 99% 99%  Weight:      Height:        Intake/Output Summary (Last 24 hours) at 04/09/2018 1019 Last data filed at 04/09/2018 0936 Gross per 24 hour  Intake 520 ml  Output 875 ml  Net -355 ml   Filed Weights   04/07/18 2217  Weight: 68 kg    Examination:  General exam: Appears calm and comfortable, chornically fatigued  Respiratory system: Clear to auscultation. Respiratory effort normal. Cardiovascular system: S1 & S2 heard, RRR. No JVD, soft 1/6 murmurs, no rubs, gallops or clicks. No pedal edema. Gastrointestinal system: Abdomen is nondistended, mildly  tender to palpation, without discrete masses, or  Organomegaly . Normal bowel sounds heard. Central nervous system: Alert and oriented. No focal neurological deficits. Extremities: Symmetric 5 x 5 power . Skin: No rashes, lesions or ulcers Psychiatry: Judgement and insight appear normal. Mood flat affect     Data Reviewed: I have personally reviewed following labs and imaging studies  CBC: Recent Labs  Lab 04/07/18 2227 04/09/18 0433  WBC 7.6 6.2  HGB 11.1* 10.0*  HCT 34.4* 30.2*  MCV 90.3 89.6  PLT 192 333   Basic Metabolic Panel: Recent Labs  Lab 04/07/18 2227 04/08/18 0654 04/09/18 0433  NA 141 140 141  K 5.1 5.2* 5.5*  CL 109 113* 114*  CO2 24 19* 22  GLUCOSE 115* 85 116*  BUN 40* 39* 47*  CREATININE 3.11* 2.83* 3.15*  CALCIUM 8.5* 8.0* 8.0*   GFR: Estimated Creatinine Clearance: 20.4 mL/min (A) (by C-G formula based on SCr of 3.15 mg/dL (H)). Liver Function Tests: Recent Labs  Lab 04/07/18 2227  AST 19  ALT 11  ALKPHOS 126  BILITOT 0.6  PROT 5.9*  ALBUMIN 3.0*   Recent Labs  Lab 04/07/18 2227  LIPASE 40   No results for input(s): AMMONIA in the last 168 hours. Coagulation Profile: No results for input(s): INR, PROTIME in the last 168 hours. Cardiac Enzymes: No results for input(s): CKTOTAL, CKMB, CKMBINDEX, TROPONINI in the last 168 hours. BNP (last 3 results) No results for input(s): PROBNP in the last 8760 hours. HbA1C: Recent Labs    04/09/18 0433  HGBA1C 6.4*   CBG: Recent Labs  Lab 04/08/18 0729 04/08/18 1124 04/08/18 1634 04/08/18 2052 04/09/18 0717  GLUCAP 90 145* 78 130* 92   Lipid Profile: No results for input(s): CHOL, HDL, LDLCALC, TRIG, CHOLHDL, LDLDIRECT in the last 72 hours. Thyroid Function Tests: Recent Labs    04/08/18 0654 04/08/18 1051  TSH 16.498*  --   FREET4  --  0.91   Anemia Panel: Recent Labs    04/08/18 1051  VITAMINB12 229  FOLATE 8.8  FERRITIN 123  TIBC 195*  IRON 79  RETICCTPCT 1.2    Sepsis Labs: No results for input(s): PROCALCITON, LATICACIDVEN in the last 168 hours.  No results found for this or any previous visit (from the past 240 hour(s)).       Radiology Studies: Ct Abdomen Pelvis Wo Contrast  Result Date: 04/08/2018 CLINICAL DATA:  62 year old male with abdominal pain. EXAM: CT ABDOMEN AND PELVIS WITHOUT CONTRAST TECHNIQUE: Multidetector CT imaging of the abdomen and pelvis was performed following the standard protocol without IV contrast. COMPARISON:  Abdominal radiograph dated 07/30/2012 FINDINGS: Evaluation of this exam is limited in the absence of intravenous contrast. Evaluation is also limited due to respiratory motion artifact. Lower chest: The visualized lung bases are clear. No intra-abdominal  free air or free fluid. Hepatobiliary: No focal liver abnormality is seen. No gallstones, gallbladder wall thickening, or biliary dilatation. Pancreas: The pancreas is unremarkable. A 3.3 x 2.3 cm ill-defined structure inferior to the tail of the pancreas (series 3, image 20 and coronal series 6, image 51) is not well characterized but most likely represents artifact from respiratory motion. A small amount of fluid is less likely. Correlation with clinical exam and pancreatic enzymes recommended to exclude pancreatitis. Spleen: Normal in size without focal abnormality. Adrenals/Urinary Tract: Adrenal glands are unremarkable. Kidneys are normal, without renal calculi, focal lesion, or hydronephrosis. Bladder is unremarkable. Stomach/Bowel: There is moderate stool throughout the colon. There is no bowel obstruction or active inflammation. Normal appendix. Vascular/Lymphatic: The abdominal aorta and IVC are grossly unremarkable on this noncontrast CT. No portal venous gas. There is no adenopathy. Reproductive: The prostate and seminal vesicles are grossly unremarkable. No pelvic mass. Other: Mild diffuse subcutaneous edema in the pelvis. No fluid collection. Musculoskeletal:  Mild degenerative changes of the spine with disc desiccation and posterior disc bulge at L5-S1. Multilevel facet hypertrophy. No acute osseous pathology. IMPRESSION: 1. No definite acute intra-abdominal or pelvic pathology. Evaluation is however limited due to respiratory motion. Hypoattenuating area inferior to the tail of the pancreas, likely artifactual. Correlation with pancreatic enzymes recommended to exclude pancreatitis. 2. No bowel obstruction or active inflammation.  Normal appendix. Electronically Signed   By: Anner Crete M.D.   On: 04/08/2018 03:03        Scheduled Meds: . heparin  5,000 Units Subcutaneous Q8H  . insulin aspart  0-5 Units Subcutaneous QHS  . insulin aspart  0-9 Units Subcutaneous TID WC  . levothyroxine  25 mcg Oral QAC breakfast   Continuous Infusions:    LOS: 0 days    Time spent: 26 min    Sharene Butters, MD [facility] Pager [phone]  If 7PM-7AM, please contact night-coverage [email or web address] Password [facility] 04/09/2018, 10:19 AM

## 2018-04-09 NOTE — Progress Notes (Addendum)
PROGRESS NOTE    Ryan Wells  WUJ:[id number] DOB: [birth year] DOA: 04/07/2018 PCP: Mack Hook, MD    Brief Narrative 62 year old Hispanic male with a history of type diabetes, hypertension,  presenting to the emergency department due to  abnormal outpatient lab work.  On presentation, he was mildly  hypertensive, but his pulse is normal.  His creatinine was 3.11, 1.19 in 2018. His  hemoglobin was 11.1, down from 2.4 in May 2018, UA with proteinuria.  CT of the abdomen and was negative for any intra-abdominal pathology.  No hydronephrosis was seen.  He was admitted for observation and management of his abnormal labs.    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 GFR on admission 23  CT of the abdomen and pelvis  was negative for any intra-abdominal pathology.  No hydronephrosis was seen.  Follow BMP daily  Avoid NSAIDS or ACE I  Nephrology consultation in view of worsening Creatinine : likely diabetic nephropathy, renal U/S   Hypertension  -uncontrolled -avoid ACE/ARB with hyperkalemia   Type II Diabetes   A1C is 6.4  Metformin held on presentation  Lab Results  Component Value Date   HGBA1C 6.4 (H) 04/09/2018    SSI   Hypothyroidism: Labs show TSH 16.498 -free t4 normal   Continue Synthroid at 25 mcg daily   Anemia of chronic disease (CKD) , normocytic No transfusion is indicated at this time    DVT prophylaxis: sq heparin  Code Status: Full code  Family Communication:  None  Disposition Plan: per renal- hope in AM with close follow up  Consultants:   Nephrology   Subjective: He denies any new complaints this morning.  Urinating well    Objective: Vitals:   04/08/18 1554 04/08/18 2051 04/09/18 0435  04/09/18 0718  BP: (!) 176/94 (!) 155/85 (!) 160/79 (!) 164/81  Pulse: 63 66 63 66  Resp: 16 18 18    Temp: 98.1 F (36.7 C) 98.1 F (36.7 C) 98.4 F (36.9 C) 97.6 F (36.4 C)  TempSrc: Oral Oral Oral Oral  SpO2: 100% 100% 99% 99%  Weight:      Height:        Intake/Output Summary (Last 24 hours) at 04/09/2018 1023 Last data filed at 04/09/2018 0936 Gross per 24 hour  Intake 520 ml  Output 875 ml  Net -355 ml   Filed Weights   04/07/18 2217  Weight: 68 kg    Examination:  General exam: Appears calm and comfortable, chronically ill Respiratory system: Clear to auscultation. Respiratory effort normal. Cardiovascular system: S1 & S2 heard, RRR. No JVD, soft 1/6 murmurs, no rubs, gallops or clicks. No pedal edema. Gastrointestinal system: Abdomen is nondistended, non tender  without discrete masses. Normal bowel sounds heard. Central nervous system: Alert and oriented. No focal neurological deficits. Extremities: Symmetric 5 x 5 power . Skin: No rashes, lesions or ulcers Psychiatry: Judgement and insight appear normal. Mood flat affect     Data Reviewed: I have personally reviewed following labs and imaging studies  CBC: Recent Labs  Lab 04/07/18 2227 04/09/18 0433  WBC 7.6 6.2  HGB 11.1* 10.0*  HCT 34.4* 30.2*  MCV 90.3 89.6  PLT 192 956   Basic Metabolic Panel: Recent  Labs  Lab 04/07/18 2227 04/08/18 0654 04/09/18 0433  NA 141 140 141  K 5.1 5.2* 5.5*  CL 109 113* 114*  CO2 24 19* 22  GLUCOSE 115* 85 116*  BUN 40* 39* 47*  CREATININE 3.11* 2.83* 3.15*  CALCIUM 8.5* 8.0* 8.0*   GFR: Estimated Creatinine Clearance: 20.4 mL/min (A) (by C-G formula based on SCr of 3.15 mg/dL (H)). Liver Function Tests: Recent Labs  Lab 04/07/18 2227  AST 19  ALT 11  ALKPHOS 126  BILITOT 0.6  PROT 5.9*  ALBUMIN 3.0*   Recent Labs  Lab 04/07/18 2227  LIPASE 40   No results for input(s): AMMONIA in the last 168 hours. Coagulation Profile: No results for  input(s): INR, PROTIME in the last 168 hours. Cardiac Enzymes: No results for input(s): CKTOTAL, CKMB, CKMBINDEX, TROPONINI in the last 168 hours. BNP (last 3 results) No results for input(s): PROBNP in the last 8760 hours. HbA1C: Recent Labs    04/09/18 0433  HGBA1C 6.4*   CBG: Recent Labs  Lab 04/08/18 0729 04/08/18 1124 04/08/18 1634 04/08/18 2052 04/09/18 0717  GLUCAP 90 145* 78 130* 92   Lipid Profile: No results for input(s): CHOL, HDL, LDLCALC, TRIG, CHOLHDL, LDLDIRECT in the last 72 hours. Thyroid Function Tests: Recent Labs    04/08/18 0654 04/08/18 1051  TSH 16.498*  --   FREET4  --  0.91   Anemia Panel: Recent Labs    04/08/18 1051  VITAMINB12 229  FOLATE 8.8  FERRITIN 123  TIBC 195*  IRON 79  RETICCTPCT 1.2   Sepsis Labs: No results for input(s): PROCALCITON, LATICACIDVEN in the last 168 hours.  No results found for this or any previous visit (from the past 240 hour(s)).       Radiology Studies: Ct Abdomen Pelvis Wo Contrast  Result Date: 04/08/2018 CLINICAL DATA:  62 year old male with abdominal pain. EXAM: CT ABDOMEN AND PELVIS WITHOUT CONTRAST TECHNIQUE: Multidetector CT imaging of the abdomen and pelvis was performed following the standard protocol without IV contrast. COMPARISON:  Abdominal radiograph dated 07/30/2012 FINDINGS: Evaluation of this exam is limited in the absence of intravenous contrast. Evaluation is also limited due to respiratory motion artifact. Lower chest: The visualized lung bases are clear. No intra-abdominal free air or free fluid. Hepatobiliary: No focal liver abnormality is seen. No gallstones, gallbladder wall thickening, or biliary dilatation. Pancreas: The pancreas is unremarkable. A 3.3 x 2.3 cm ill-defined structure inferior to the tail of the pancreas (series 3, image 20 and coronal series 6, image 51) is not well characterized but most likely represents artifact from respiratory motion. A small amount of fluid is  less likely. Correlation with clinical exam and pancreatic enzymes recommended to exclude pancreatitis. Spleen: Normal in size without focal abnormality. Adrenals/Urinary Tract: Adrenal glands are unremarkable. Kidneys are normal, without renal calculi, focal lesion, or hydronephrosis. Bladder is unremarkable. Stomach/Bowel: There is moderate stool throughout the colon. There is no bowel obstruction or active inflammation. Normal appendix. Vascular/Lymphatic: The abdominal aorta and IVC are grossly unremarkable on this noncontrast CT. No portal venous gas. There is no adenopathy. Reproductive: The prostate and seminal vesicles are grossly unremarkable. No pelvic mass. Other: Mild diffuse subcutaneous edema in the pelvis. No fluid collection. Musculoskeletal: Mild degenerative changes of the spine with disc desiccation and posterior disc bulge at L5-S1. Multilevel facet hypertrophy. No acute osseous pathology. IMPRESSION: 1. No definite acute intra-abdominal or pelvic pathology. Evaluation is however limited due to respiratory motion. Hypoattenuating area inferior to the  tail of the pancreas, likely artifactual. Correlation with pancreatic enzymes recommended to exclude pancreatitis. 2. No bowel obstruction or active inflammation.  Normal appendix. Electronically Signed   By: Anner Crete M.D.   On: 04/08/2018 03:03        Scheduled Meds: . heparin  5,000 Units Subcutaneous Q8H  . insulin aspart  0-5 Units Subcutaneous QHS  . insulin aspart  0-9 Units Subcutaneous TID WC  . levothyroxine  25 mcg Oral QAC breakfast   Continuous Infusions:    LOS: 0 days     Eulogio Bear DO [facility]  If 7PM-7AM, please contact night-coverage [email or web address] Password [facility] 04/09/2018, 10:23 AM

## 2018-04-10 DIAGNOSIS — D649 Anemia, unspecified: Secondary | ICD-10-CM

## 2018-04-10 DIAGNOSIS — N184 Chronic kidney disease, stage 4 (severe): Secondary | ICD-10-CM

## 2018-04-10 LAB — PROTEIN / CREATININE RATIO, URINE
Creatinine, Urine: 61.77 mg/dL
Protein Creatinine Ratio: 9.31 mg/mg{Cre} — ABNORMAL HIGH (ref 0.00–0.15)
Total Protein, Urine: 575 mg/dL

## 2018-04-10 LAB — RENAL FUNCTION PANEL
Albumin: 2.5 g/dL — ABNORMAL LOW (ref 3.5–5.0)
Anion gap: 6 (ref 5–15)
BUN: 51 mg/dL — ABNORMAL HIGH (ref 8–23)
CO2: 22 mmol/L (ref 22–32)
Calcium: 8.1 mg/dL — ABNORMAL LOW (ref 8.9–10.3)
Chloride: 114 mmol/L — ABNORMAL HIGH (ref 98–111)
Creatinine, Ser: 3.31 mg/dL — ABNORMAL HIGH (ref 0.61–1.24)
GFR calc Af Amer: 21 mL/min — ABNORMAL LOW (ref >60)
GFR calc non Af Amer: 18 mL/min — ABNORMAL LOW (ref >60)
Glucose, Bld: 89 mg/dL (ref 70–99)
Phosphorus: 5.2 mg/dL — ABNORMAL HIGH (ref 2.5–4.6)
Potassium: 5 mmol/L (ref 3.5–5.1)
Sodium: 142 mmol/L (ref 135–145)

## 2018-04-10 LAB — GLUCOSE, CAPILLARY
Glucose-Capillary: 81 mg/dL (ref 70–99)
Glucose-Capillary: 113 mg/dL — ABNORMAL HIGH (ref 70–99)
Glucose-Capillary: 137 mg/dL — ABNORMAL HIGH (ref 70–99)
Glucose-Capillary: 112 mg/dL — ABNORMAL HIGH (ref 70–99)

## 2018-04-10 LAB — PTH, INTACT AND CALCIUM
Calcium, Total (PTH): 8.1 mg/dL — ABNORMAL LOW (ref 8.6–10.2)
PTH: 138 pg/mL — ABNORMAL HIGH (ref 15–65)

## 2018-04-10 LAB — VITAMIN D 25 HYDROXY (VIT D DEFICIENCY, FRACTURES): Vit D, 25-Hydroxy: 8.9 ng/mL — ABNORMAL LOW (ref 30.0–100.0)

## 2018-04-10 MED ORDER — CALCITRIOL 0.25 MCG PO CAPS
0.2500 ug | ORAL_CAPSULE | Freq: Every day | ORAL | Status: DC
  Administered 2018-04-10 – 2018-04-11 (×2): 0.25 ug via ORAL
  Filled 2018-04-10 (×2): qty 1

## 2018-04-10 MED ORDER — SODIUM POLYSTYRENE SULFONATE 15 GM/60ML PO SUSP
45.0000 g | Freq: Once | ORAL | Status: AC
  Administered 2018-04-10: 30 g via ORAL
  Filled 2018-04-10: qty 180

## 2018-04-10 MED ORDER — FUROSEMIDE 20 MG PO TABS
20.0000 mg | ORAL_TABLET | Freq: Every day | ORAL | Status: DC
  Administered 2018-04-10 – 2018-04-11 (×2): 20 mg via ORAL
  Filled 2018-04-10 (×2): qty 1

## 2018-04-10 NOTE — Progress Notes (Addendum)
Subjective: Interval History: no complaints this morning other than neuropathy, no cp or SOB.    Objective: Vital signs in last 24 hours: Temp:  [98.6 F (37 C)-99 F (37.2 C)] 98.6 F (37 C) (08/30 0503) Pulse Rate:  [64-67] 67 (08/30 0503) Resp:  [18-20] 18 (08/30 0503) BP: (143-150)/(77-83) 144/83 (08/30 0503) SpO2:  [98 %-99 %] 98 % (08/30 0503) Weight change:   Intake/Output from previous day: 08/29 0701 - 08/30 0700 In: 860 [P.O.:860] Out: 1750 [Urine:1750] Intake/Output this shift: No intake/output data recorded.  Cardiac: normal rate and rhythm, clear s1 and s2 Pulmonary: no wheezing, mild basilar rales in LLL Abdominal: tender suprapubic area of abdomen, mildly distended Extremities: 1-2+ LE edema bilaterally Mental status: Alert, conversant, in good spirits   Lab Results: Recent Labs    04/07/18 2227 04/09/18 0433  WBC 7.6 6.2  HGB 11.1* 10.0*  HCT 34.4* 30.2*  PLT 192 163   BMET:  Recent Labs    04/08/18 0654 04/09/18 0433 04/09/18 1447  NA 140 141  --   K 5.2* 5.5*  --   CL 113* 114*  --   CO2 19* 22  --   GLUCOSE 85 116*  --   BUN 39* 47*  --   CREATININE 2.83* 3.15*  --   CALCIUM 8.0* 8.0* 8.1*   Recent Labs    04/09/18 1447  PTH 138*  Comment   Iron Studies:  Recent Labs    04/08/18 1051  IRON 79  TIBC 195*  FERRITIN 123   CBG (last 3)  Recent Labs    04/09/18 1624 04/09/18 2119 04/10/18 0728  GLUCAP 88 89 81     Studies/Results: US Renal  Result Date: 04/09/2018 CLINICAL DATA:  Initial evaluation for acute renal injury. EXAM: RENAL / URINARY TRACT ULTRASOUND COMPLETE COMPARISON:  Prior CT from 04/08/2018 FINDINGS: Right Kidney: Length: 8.8 cm. Echogenicity within normal limits. No mass or hydronephrosis visualized. Left Kidney: Length: 9.8 cm. Echogenicity within normal limits. Lobulated renal contour noted. No mass or hydronephrosis visualized. Bladder: Appears normal for degree of bladder distention. No made of an  enlarged prostate. IMPRESSION: 1. Negative renal ultrasound.  No hydronephrosis. 2. Enlarged prostate. Electronically Signed   By: Jeannine Boga M.D.   On: 04/09/2018 17:34    I have reviewed the patient's current medications.  Assessment/Plan: 1 CKDIV:  Almost certainly progressive diabetic nephropathy.  Also an element of hypertensive nephropathy likely contributing.  Medication review negative for causal agents, urine studies, clinical picture and labs not consistent with autoimmune or other processes. No signs of obstruction or symptoms of BPH. Renal US shows some enlargement of prostate.    -Urine protein to cr ratio never collected, will ask for this to please be collected, microalbumin is not what we need to quantify -creatinine about the same today -bp control, blood sugar control -will need close follow up in nephrology clinic   2 HTN: uncontrolled for a long period of time now.  Not taking lisinopril.     -will avoid ace/arb for now with hyperkalemia -started amlodipine 10mg  -will add lasix 20mg  daily    3 Normocytic Anemia ACD: Does not appear to be iron deficient, B12 and folate normal, pt not symptomatic, Hgb 10, no indication for aranesp   4. Metabolic Bone Disease: phos only mildly elevated, calcium and vit D low and PTH high, will add calcitriol    LOS: 1 day   Brandon Osric Klopf 04/10/2018,8:35 AM

## 2018-04-10 NOTE — Progress Notes (Signed)
PROGRESS NOTE    Ryan Wells  IAX:[id number] DOB: [birth year] DOA: 04/07/2018 PCP: Mack Hook, MD    Brief Narrative 62 year old Hispanic male with a history of type diabetes, hypertension,  presenting to the emergency department due to  abnormal outpatient lab work.  On presentation, he was mildly  hypertensive, but his pulse is normal.  His creatinine was 3.11, 1.19 in 2018. His  hemoglobin was 11.1, down from 2.4 in May 2018, UA with proteinuria.  CT of the abdomen and was negative for any intra-abdominal pathology.  No hydronephrosis was seen.  He was admitted for observation and management of his abnormal labs.    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 -No evidence of obstruction, renal ultrasound with no evidence of hydronephrosis, significant for enlarged prostate  Hypertension  -Controlled after starting amlodipine -avoid ACE/ARB with hyperkalemia  Hyperkalemia -In the setting of chronic kidney disease, solid today, repeat BMP in a.m.  Type II Diabetes   A1C is 6.4  Metformin held on presentation  Lab Results  Component Value Date   HGBA1C 6.4 (H) 04/09/2018    SSI   Hypothyroidism: Labs show TSH 16.498 -free t4 normal   Continue Synthroid at 25 mcg daily   Anemia of chronic disease (CKD) , normocytic No transfusion is indicated at this time    DVT prophylaxis: sq heparin  Code Status: Full code  Family Communication:  None  Disposition Plan: When cleared  by renal  Consultants:   Nephrology   Subjective: He denies any complaints today    Objective: Vitals:   04/09/18 0718 04/09/18 1624 04/09/18 2118 04/10/18 0503  BP: (!) 164/81 (!) 150/77 (!) 143/78 (!) 144/83  Pulse: 66 64 65 67  Resp:  20 18 18   Temp: 97.6 F (36.4 C) 99 F (37.2 C) 98.7 F (37.1 C) 98.6 F (37 C)  TempSrc: Oral Oral    SpO2: 99% 98% 99% 98%  Weight:      Height:        Intake/Output Summary (Last 24 hours) at 04/10/2018 1407 Last data filed at 04/10/2018 0928 Gross per 24 hour  Intake 720 ml  Output 1200 ml  Net -480 ml   Filed Weights   04/07/18 2217  Weight: 68 kg    Examination:  Awake Alert, Oriented X 3, No new F.N deficits, Normal affect Symmetrical Chest wall movement, Good air movement bilaterally, CTAB RRR,No Gallops,Rubs or new Murmurs, No Parasternal Heave +ve B.Sounds, Abd Soft, No tenderness, No rebound - guarding or rigidity. No Cyanosis, Clubbing ,has mild bilateral pedal edema, No new Rash or bruise      Data Reviewed: I have personally reviewed following labs and imaging studies  CBC: Recent Labs  Lab 04/07/18 2227 04/09/18 0433  WBC 7.6 6.2  HGB 11.1* 10.0*  HCT 34.4* 30.2*  MCV 90.3 89.6  PLT 192 078   Basic Metabolic Panel: Recent Labs  Lab 04/07/18 2227 04/08/18 0654 04/09/18 0433 04/09/18 1447 04/10/18 0813  NA 141 140 141  --  142  K 5.1 5.2* 5.5*  --  5.0  CL 109 113* 114*  --  114*  CO2 24 19* 22  --  22  GLUCOSE 115* 85 116*  --  89  BUN 40* 39* 47*  --  51*  CREATININE 3.11* 2.83* 3.15*  --  3.31*  CALCIUM 8.5* 8.0* 8.0* 8.1* 8.1*  PHOS  --   --   --  4.4 5.2*   GFR: Estimated Creatinine Clearance: 19.4 mL/min (A) (by C-G formula based on SCr of 3.31 mg/dL (H)). Liver Function Tests: Recent Labs  Lab 04/07/18 2227 04/10/18 0813  AST 19  --   ALT 11  --   ALKPHOS 126  --   BILITOT 0.6  --   PROT 5.9*  --   ALBUMIN 3.0* 2.5*   Recent Labs  Lab 04/07/18 2227  LIPASE 40   No  results for input(s): AMMONIA in the last 168 hours. Coagulation Profile: No results for input(s): INR, PROTIME in the last 168 hours. Cardiac Enzymes: No results for input(s): CKTOTAL, CKMB, CKMBINDEX, TROPONINI in the last 168 hours. BNP (last 3 results) No results for input(s): PROBNP in the last 8760 hours. HbA1C: Recent Labs    04/09/18 0433  HGBA1C 6.4*   CBG: Recent Labs  Lab 04/09/18 1145 04/09/18 1624 04/09/18 2119 04/10/18 0728 04/10/18 1140  GLUCAP 95 88 89 81 113*   Lipid Profile: No results for input(s): CHOL, HDL, LDLCALC, TRIG, CHOLHDL, LDLDIRECT in the last 72 hours. Thyroid Function Tests: Recent Labs    04/08/18 0654 04/08/18 1051  TSH 16.498*  --   FREET4  --  0.91   Anemia Panel: Recent Labs    04/08/18 1051  VITAMINB12 229  FOLATE 8.8  FERRITIN 123  TIBC 195*  IRON 79  RETICCTPCT 1.2   Sepsis Labs: No results for input(s): PROCALCITON, LATICACIDVEN in the last 168 hours.  No results found for this or any previous visit (from the past 240 hour(s)).       Radiology Studies: US Renal  Result Date: 04/09/2018 CLINICAL DATA:  Initial evaluation for acute renal injury. EXAM: RENAL / URINARY TRACT ULTRASOUND COMPLETE COMPARISON:  Prior CT from 04/08/2018 FINDINGS: Right Kidney: Length: 8.8 cm. Echogenicity within normal limits. No mass or hydronephrosis visualized. Left Kidney: Length: 9.8 cm. Echogenicity within normal limits. Lobulated renal contour noted. No mass or hydronephrosis visualized. Bladder: Appears normal for degree of bladder distention. No made of an enlarged prostate. IMPRESSION: 1. Negative renal ultrasound.  No hydronephrosis. 2. Enlarged prostate. Electronically Signed   By: Jeannine Boga M.D.   On: 04/09/2018 17:34        Scheduled Meds: . amLODipine  10 mg Oral Daily  . calcitRIOL  0.25 mcg Oral Daily  . furosemide  20 mg Oral Daily  . heparin  5,000 Units Subcutaneous Q8H  . insulin aspart  0-5 Units  Subcutaneous QHS  . insulin aspart  0-9 Units Subcutaneous TID WC  . levothyroxine  25 mcg Oral QAC breakfast   Continuous Infusions:    LOS: 1 day     Phillips Climes MD [facility]  If 7PM-7AM, please contact night-coverage [email or web address] Password [email or web address] 04/10/2018, 2:07 PM

## 2018-04-11 DIAGNOSIS — I10 Essential (primary) hypertension: Secondary | ICD-10-CM

## 2018-04-11 DIAGNOSIS — E119 Type 2 diabetes mellitus without complications: Secondary | ICD-10-CM

## 2018-04-11 LAB — RENAL FUNCTION PANEL
Albumin: 2.6 g/dL — ABNORMAL LOW (ref 3.5–5.0)
Anion gap: 7 (ref 5–15)
BUN: 60 mg/dL — ABNORMAL HIGH (ref 8–23)
CO2: 23 mmol/L (ref 22–32)
Calcium: 8.1 mg/dL — ABNORMAL LOW (ref 8.9–10.3)
Chloride: 111 mmol/L (ref 98–111)
Creatinine, Ser: 3.49 mg/dL — ABNORMAL HIGH (ref 0.61–1.24)
GFR calc Af Amer: 20 mL/min — ABNORMAL LOW (ref >60)
GFR calc non Af Amer: 17 mL/min — ABNORMAL LOW (ref >60)
Glucose, Bld: 110 mg/dL — ABNORMAL HIGH (ref 70–99)
Phosphorus: 4.6 mg/dL (ref 2.5–4.6)
Potassium: 4.1 mmol/L (ref 3.5–5.1)
Sodium: 141 mmol/L (ref 135–145)

## 2018-04-11 LAB — CBC
HCT: 34.3 % — ABNORMAL LOW (ref 39.0–52.0)
Hemoglobin: 11.3 g/dL — ABNORMAL LOW (ref 13.0–17.0)
MCH: 29 pg (ref 26.0–34.0)
MCHC: 32.9 g/dL (ref 30.0–36.0)
MCV: 88.2 fL (ref 78.0–100.0)
Platelets: 155 10*3/uL (ref 150–400)
RBC: 3.89 MIL/uL — ABNORMAL LOW (ref 4.22–5.81)
RDW: 12.2 % (ref 11.5–15.5)
WBC: 7 10*3/uL (ref 4.0–10.5)

## 2018-04-11 LAB — GLUCOSE, CAPILLARY
Glucose-Capillary: 97 mg/dL (ref 70–99)
Glucose-Capillary: 109 mg/dL — ABNORMAL HIGH (ref 70–99)

## 2018-04-11 MED ORDER — GLIMEPIRIDE 1 MG PO TABS
1.0000 mg | ORAL_TABLET | Freq: Every day | ORAL | Status: DC
  Administered 2018-04-11: 1 mg via ORAL
  Filled 2018-04-11: qty 1

## 2018-04-11 MED ORDER — CALCITRIOL 0.25 MCG PO CAPS: 0.2500 ug | ORAL_CAPSULE | Freq: Every day | ORAL | 0 refills | Status: DC

## 2018-04-11 MED ORDER — AMLODIPINE BESYLATE 10 MG PO TABS: 10.0000 mg | ORAL_TABLET | Freq: Every day | ORAL | 0 refills | Status: DC

## 2018-04-11 MED ORDER — FUROSEMIDE 20 MG PO TABS: 20.0000 mg | ORAL_TABLET | Freq: Every day | ORAL | 0 refills | Status: DC

## 2018-04-11 NOTE — Discharge Summary (Signed)
Ryan Wells, is a 62 y.o. male  DOB 1956-04-11  MRN [id number].  Admission date:  04/07/2018  Admitting Physician  Vianne Bulls, MD  Discharge Date:  04/11/2018   Primary MD  Mack Hook, MD  Recommendations for primary care physician for things to follow:  -Please check CBC, BMP during next visit -Not on any hypoglycemic agent currently giving very well controlled CBGs during hospital stay, please readdress if CBG started to increase   Admission Diagnosis  Acute renal failure, unspecified acute renal failure type [facility]) [N17.9]   Discharge Diagnosis  Acute renal failure, unspecified acute renal failure type ([place]) [N17.9]    Principal Problem:   Renal failure Active Problems:   Hypothyroidism   Normocytic anemia   Hypertension   Diabetes mellitus type II, non insulin dependent ([place])   CKD (chronic kidney disease), stage IV ([place])      Past Medical History:  Diagnosis Date  . 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 Chest pain 11/2013   Normal Echo/ EKG/enzymes-hospitalized.  Did not get outpatient stress testing following hospitalization.  No chest pain since  . Diabetes type 2, uncontrolled ([place]) 11/25/2011  . Diabetic gastroparesis ([place]) 01/22/2017  . Microalbuminuria 01/19/2017    Past Surgical History:  Procedure Laterality Date  . EYE SURGERY     cataracts  . NECK SURGERY         History of present illness and  Hospital Course:     Kindly see H&P for history of present illness and admission details, please review complete Labs, Consult reports and Test reports for all details in brief  HPI  from the history and physical done on the day of admission 04/08/2018  HPI: Ryan Wells is a 62 y.o. male with medical history significant for type 2 diabetes mellitus, hypertension, and hypothyroidism, now presenting to the emergency department for evaluation of  abnormal outpatient lab.  Patient reports roughly 8 months of mild discomfort in the mid abdomen as well as progressive edema involving the bilateral lower extremities.  He has taken NSAID previously, but not recently.  He denies any recent vomiting or diarrhea.  Denies any flank pain or hematuria.  No fevers or chills.  Reports that he has not been taking his Synthroid, but takes metformin and lisinopril daily.  ED Course: Upon arrival to the ED, patient is found to be afebrile, saturating well on room air, and hypertensive.  Chemistry panel is notable for a creatinine of 3.11, up from 1.19 in May 2018.  CBC is notable for a mild normocytic anemia with hemoglobin of 11.1, down from 14.4 in May 2018.  Urinalysis features proteinuria and hyaline casts.  CT of the abdomen and pelvis is limited by respiratory motion, but with no definite intra-abdominal or pelvic pathology, and specifically normal appearance to kidneys and bladder without hydronephrosis.  Patient was given a liter of normal saline in the ED.  He will be observed for ongoing evaluation and management of renal failure of uncertain chronicity.  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 -No evidence of obstruction, renal ultrasound with no evidence of hydronephrosis, significant for enlarged prostate -Patient will be discharged on Norvasc for hypertension control, Lasix 20 mg oral daily, and to follow with renal as an outpatient  Hypertension  -Controlled after starting amlodipine -avoid ACE/ARB with hyperkalemia and renal failure  Hyperkalemia -In the setting of chronic kidney disease,  solved, to continue renal diet  Type II Diabetes   A1C is 6.4, during this hospital stay, but was 15.5 last year, his CBG has been controlled during hospital stay with no requirement of any insulin oral medications, so for now we will not resume on any oral hypoglycemic agent in fear of hypoglycemia, he was instructed to monitor his CBG closely, and if elevated to follow with his PCP to be started on meds.    Hypothyroidism: Labs show TSH 16.498 -free t4 normal   Continue Synthroid at 25 mcg daily   Anemia of chronic disease (CKD) , normocytic No transfusion is indicated at this time      Discharge Condition:  Stable  Follow UP  Follow-up Information    Mack Hook, MD Follow up in 1 week(s).   Specialty:  Internal Medicine Contact information: [place] [address] [phone]        Rexene Agent, MD Follow up.   Specialty:  Nephrology Why:   will be called with an appointment Contact information: [place] [id number] [phone]             Discharge Instructions  and  Discharge Medications     Discharge Instructions    Discharge instructions   Complete by:  As directed    Follow with Primary MD Mack Hook, MD in 7 days   Get CBC, CMP, checked  by Primary MD next visit.    Activity: As tolerated with Full fall precautions use walker/cane & assistance as needed   Disposition Home    Diet: Heart renal, carbohydrate modified, with feeding assistance and aspiration precautions.  For Heart failure patients - Check your Weight same time everyday, if you gain over 2 pounds, or you develop in leg swelling, experience more shortness of breath or chest pain, call your Primary MD immediately. Follow Cardiac Low Salt Diet and 1.5 lit/day fluid restriction.   On your next visit with your primary care physician please Get Medicines reviewed and adjusted.   Please request your Prim.MD to go over all  Hospital Tests and Procedure/Radiological results at the follow up, please get all Hospital records sent to your Prim MD by signing hospital release before you go home.   If you experience worsening of your admission symptoms, develop shortness of breath, life threatening emergency, suicidal or homicidal thoughts you must seek medical attention immediately by calling 911 or calling your MD immediately  if symptoms less severe.  You Must read complete instructions/literature along with all the possible adverse reactions/side effects for all the Medicines you take and that have been prescribed to you. Take any new Medicines after you have completely understood and accpet all the possible adverse reactions/side effects.   Do not  drive, operating heavy machinery, perform activities at heights, swimming or participation in water activities or provide baby sitting services if your were admitted for syncope or siezures until you have seen by Primary MD or a Neurologist and advised to do so again.  Do not drive when taking Pain medications.    Do not take more than prescribed Pain, Sleep and Anxiety Medications  Special Instructions: If you have smoked or chewed Tobacco  in the last 2 yrs please stop smoking, stop any regular Alcohol  and or any Recreational drug use.  Wear Seat belts while driving.   Please note  You were cared for by a hospitalist during your hospital stay. If you have any questions about your discharge medications or the care you received while you were in the hospital after you are discharged, you can call the unit and asked to speak with the hospitalist on call if the hospitalist that took care of you is not available. Once you are discharged, your primary care physician will handle any further medical issues. Please note that NO REFILLS for any discharge medications will be authorized once you are discharged, as it is imperative that you return to your primary care physician (or  establish a relationship with a primary care physician if you do not have one) for your aftercare needs so that they can reassess your need for medications and monitor your lab values.   Increase activity slowly   Complete by:  As directed      Allergies as of 04/11/2018   No Known Allergies     Medication List    STOP taking these medications   glipiZIDE 5 MG tablet Commonly known as:  GLUCOTROL   lisinopril 10 MG tablet Commonly known as:  PRINIVIL,ZESTRIL   meloxicam 7.5 MG tablet Commonly known as:  MOBIC   metFORMIN 1000 MG tablet Commonly known as:  GLUCOPHAGE     TAKE these medications   amLODipine 10 MG tablet Commonly known as:  NORVASC Take 1 tablet (10 mg total) by mouth daily. Start taking on:  04/12/2018   aspirin EC 81 MG tablet Take 1 tablet (81 mg total) by mouth daily.   calcitRIOL 0.25 MCG capsule Commonly known as:  ROCALTROL Take 1 capsule (0.25 mcg total) by mouth daily. Start taking on:  04/12/2018   cyclobenzaprine 5 MG tablet Commonly known as:  FLEXERIL 1-2 tabs by mouth every 6 hours as needed for back pain   furosemide 20 MG tablet Commonly known as:  LASIX Take 1 tablet (20 mg total) by mouth daily. Start taking on:  04/12/2018   levothyroxine 25 MCG tablet Commonly known as:  SYNTHROID, LEVOTHROID Take 1 tablet (25 mcg total) by mouth daily before breakfast.   metoCLOPramide 10 MG tablet Commonly known as:  REGLAN 1/2 tab by mouth 4 times daily before meals and at bedtime         Diet and Activity recommendation: See Discharge Instructions above   Consults obtained -  renal   Major procedures and Radiology Reports - PLEASE review detailed and final reports for all details, in brief -      Ct Abdomen Pelvis Wo Contrast  Result Date: 04/08/2018 CLINICAL DATA:  62 year old male with abdominal pain. EXAM: CT ABDOMEN AND PELVIS WITHOUT CONTRAST TECHNIQUE: Multidetector CT imaging of the abdomen and pelvis was performed  following the standard protocol without IV contrast. COMPARISON:  Abdominal radiograph dated 07/30/2012 FINDINGS: Evaluation of this exam is limited in the absence of  intravenous contrast. Evaluation is also limited due to respiratory motion artifact. Lower chest: The visualized lung bases are clear. No intra-abdominal free air or free fluid. Hepatobiliary: No focal liver abnormality is seen. No gallstones, gallbladder wall thickening, or biliary dilatation. Pancreas: The pancreas is unremarkable. A 3.3 x 2.3 cm ill-defined structure inferior to the tail of the pancreas (series 3, image 20 and coronal series 6, image 51) is not well characterized but most likely represents artifact from respiratory motion. A small amount of fluid is less likely. Correlation with clinical exam and pancreatic enzymes recommended to exclude pancreatitis. Spleen: Normal in size without focal abnormality. Adrenals/Urinary Tract: Adrenal glands are unremarkable. Kidneys are normal, without renal calculi, focal lesion, or hydronephrosis. Bladder is unremarkable. Stomach/Bowel: There is moderate stool throughout the colon. There is no bowel obstruction or active inflammation. Normal appendix. Vascular/Lymphatic: The abdominal aorta and IVC are grossly unremarkable on this noncontrast CT. No portal venous gas. There is no adenopathy. Reproductive: The prostate and seminal vesicles are grossly unremarkable. No pelvic mass. Other: Mild diffuse subcutaneous edema in the pelvis. No fluid collection. Musculoskeletal: Mild degenerative changes of the spine with disc desiccation and posterior disc bulge at L5-S1. Multilevel facet hypertrophy. No acute osseous pathology. IMPRESSION: 1. No definite acute intra-abdominal or pelvic pathology. Evaluation is however limited due to respiratory motion. Hypoattenuating area inferior to the tail of the pancreas, likely artifactual. Correlation with pancreatic enzymes recommended to exclude pancreatitis. 2.  No bowel obstruction or active inflammation.  Normal appendix. Electronically Signed   By: Anner Crete M.D.   On: 04/08/2018 03:03   US Renal  Result Date: 04/09/2018 CLINICAL DATA:  Initial evaluation for acute renal injury. EXAM: RENAL / URINARY TRACT ULTRASOUND COMPLETE COMPARISON:  Prior CT from 04/08/2018 FINDINGS: Right Kidney: Length: 8.8 cm. Echogenicity within normal limits. No mass or hydronephrosis visualized. Left Kidney: Length: 9.8 cm. Echogenicity within normal limits. Lobulated renal contour noted. No mass or hydronephrosis visualized. Bladder: Appears normal for degree of bladder distention. No made of an enlarged prostate. IMPRESSION: 1. Negative renal ultrasound.  No hydronephrosis. 2. Enlarged prostate. Electronically Signed   By: Jeannine Boga M.D.   On: 04/09/2018 17:34    Micro Results     No results found for this or any previous visit (from the past 240 hour(s)).     Today   Subjective:   Ryan Wells today has no headache,no chest or abdominal pain,no new weakness tingling or numbness, feels much better e today.   Objective:   Blood pressure (!) 153/73, pulse (!) 59, temperature 98.4 F (36.9 C), temperature source Oral, resp. rate (!) 26, height 5\' 2"  (1.575 m), weight 68 kg, SpO2 99 %.   Intake/Output Summary (Last 24 hours) at 04/11/2018 1501 Last data filed at 04/11/2018 1300 Gross per 24 hour  Intake 650 ml  Output 975 ml  Net -325 ml    Exam Awake Alert, Oriented x 3, No new F.N deficits, Normal affect  Symmetrical Chest wall movement, Good air movement bilaterally, CTAB RRR,No Gallops,Rubs or new Murmurs, No Parasternal Heave +ve B.Sounds, Abd Soft, Non tender, No rebound -guarding or rigidity. No Cyanosis, Clubbing or edema, No new Rash or bruise  Data Review   CBC w Diff:  Lab Results  Component Value Date   WBC 7.0 04/11/2018   HGB 11.3 (L) 04/11/2018   HGB 14.4 12/19/2016   HCT 34.3 (L) 04/11/2018   HCT 42.8  12/19/2016   PLT 155 04/11/2018  PLT 258 12/19/2016   LYMPHOPCT 29 12/06/2013   MONOPCT 6 12/06/2013   EOSPCT 1 12/06/2013   BASOPCT 1 12/06/2013    CMP:  Lab Results  Component Value Date   NA 141 04/11/2018   NA 140 12/19/2016   K 4.1 04/11/2018   CL 111 04/11/2018   CO2 23 04/11/2018   BUN 60 (H) 04/11/2018   BUN 25 12/19/2016   CREATININE 3.49 (H) 04/11/2018   CREATININE 0.95 12/06/2013   PROT 5.9 (L) 04/07/2018   PROT 6.5 12/19/2016   ALBUMIN 2.6 (L) 04/11/2018   ALBUMIN 3.7 12/19/2016   BILITOT 0.6 04/07/2018   BILITOT 0.5 12/19/2016   ALKPHOS 126 04/07/2018   AST 19 04/07/2018   ALT 11 04/07/2018  .   Total Time in preparing paper work, data evaluation and todays exam - 60 minutes  Phillips Climes M.D on 04/11/2018 at 3:01 PM  [facility]   Office  [phone]

## 2018-04-11 NOTE — Progress Notes (Signed)
Patient discharged to home. Patient AVS reviewed and signed. Patient capable re-verbalizing medications and follow-up appointments. IV removed. Patient belongings sent with patient. Patient educated to return to the ED in the event of SOB, chest pain or dizziness.   Harmoni Lucus B. RN 

## 2018-04-11 NOTE — Discharge Instructions (Signed)
Follow with Primary MD Mack Hook, MD in 7 days   Get CBC, CMP, checked  by Primary MD next visit.    Activity: As tolerated with Full fall precautions use walker/cane & assistance as needed   Disposition Home    Diet: Heart renal, carbohydrate modified, with feeding assistance and aspiration precautions.  For Heart failure patients - Check your Weight same time everyday, if you gain over 2 pounds, or you develop in leg swelling, experience more shortness of breath or chest pain, call your Primary MD immediately. Follow Cardiac Low Salt Diet and 1.5 lit/day fluid restriction.   On your next visit with your primary care physician please Get Medicines reviewed and adjusted.   Please request your Prim.MD to go over all Hospital Tests and Procedure/Radiological results at the follow up, please get all Hospital records sent to your Prim MD by signing hospital release before you go home.   If you experience worsening of your admission symptoms, develop shortness of breath, life threatening emergency, suicidal or homicidal thoughts you must seek medical attention immediately by calling 911 or calling your MD immediately  if symptoms less severe.  You Must read complete instructions/literature along with all the possible adverse reactions/side effects for all the Medicines you take and that have been prescribed to you. Take any new Medicines after you have completely understood and accpet all the possible adverse reactions/side effects.   Do not drive, operating heavy machinery, perform activities at heights, swimming or participation in water activities or provide baby sitting services if your were admitted for syncope or siezures until you have seen by Primary MD or a Neurologist and advised to do so again.  Do not drive when taking Pain medications.    Do not take more than prescribed Pain, Sleep and Anxiety Medications  Special Instructions: If you have smoked or chewed Tobacco   in the last 2 yrs please stop smoking, stop any regular Alcohol  and or any Recreational drug use.  Wear Seat belts while driving.   Please note  You were cared for by a hospitalist during your hospital stay. If you have any questions about your discharge medications or the care you received while you were in the hospital after you are discharged, you can call the unit and asked to speak with the hospitalist on call if the hospitalist that took care of you is not available. Once you are discharged, your primary care physician will handle any further medical issues. Please note that NO REFILLS for any discharge medications will be authorized once you are discharged, as it is imperative that you return to your primary care physician (or establish a relationship with a primary care physician if you do not have one) for your aftercare needs so that they can reassess your need for medications and monitor your lab values.

## 2018-05-15 DIAGNOSIS — E1122 Type 2 diabetes mellitus with diabetic chronic kidney disease: Secondary | ICD-10-CM | POA: Diagnosis not present

## 2018-05-15 DIAGNOSIS — N2581 Secondary hyperparathyroidism of renal origin: Secondary | ICD-10-CM | POA: Diagnosis not present

## 2018-05-15 DIAGNOSIS — I129 Hypertensive chronic kidney disease with stage 1 through stage 4 chronic kidney disease, or unspecified chronic kidney disease: Secondary | ICD-10-CM | POA: Diagnosis not present

## 2018-05-15 DIAGNOSIS — N184 Chronic kidney disease, stage 4 (severe): Secondary | ICD-10-CM | POA: Diagnosis not present

## 2018-05-15 DIAGNOSIS — D631 Anemia in chronic kidney disease: Secondary | ICD-10-CM | POA: Diagnosis not present

## 2018-05-15 DIAGNOSIS — E559 Vitamin D deficiency, unspecified: Secondary | ICD-10-CM | POA: Diagnosis not present

## 2018-05-25 ENCOUNTER — Other Ambulatory Visit: Payer: Self-pay

## 2018-05-25 DIAGNOSIS — N184 Chronic kidney disease, stage 4 (severe): Secondary | ICD-10-CM

## 2018-06-08 ENCOUNTER — Ambulatory Visit (INDEPENDENT_AMBULATORY_CARE_PROVIDER_SITE_OTHER): Payer: Self-pay | Admitting: Physician Assistant

## 2018-06-15 DIAGNOSIS — D631 Anemia in chronic kidney disease: Secondary | ICD-10-CM | POA: Diagnosis not present

## 2018-06-15 DIAGNOSIS — N184 Chronic kidney disease, stage 4 (severe): Secondary | ICD-10-CM | POA: Diagnosis not present

## 2018-06-15 DIAGNOSIS — N2581 Secondary hyperparathyroidism of renal origin: Secondary | ICD-10-CM | POA: Diagnosis not present

## 2018-06-15 DIAGNOSIS — E559 Vitamin D deficiency, unspecified: Secondary | ICD-10-CM | POA: Diagnosis not present

## 2018-06-25 DIAGNOSIS — I129 Hypertensive chronic kidney disease with stage 1 through stage 4 chronic kidney disease, or unspecified chronic kidney disease: Secondary | ICD-10-CM | POA: Diagnosis not present

## 2018-06-25 DIAGNOSIS — E1122 Type 2 diabetes mellitus with diabetic chronic kidney disease: Secondary | ICD-10-CM | POA: Diagnosis not present

## 2018-06-25 DIAGNOSIS — E559 Vitamin D deficiency, unspecified: Secondary | ICD-10-CM | POA: Diagnosis not present

## 2018-06-25 DIAGNOSIS — D631 Anemia in chronic kidney disease: Secondary | ICD-10-CM | POA: Diagnosis not present

## 2018-06-25 DIAGNOSIS — N2581 Secondary hyperparathyroidism of renal origin: Secondary | ICD-10-CM | POA: Diagnosis not present

## 2018-06-25 DIAGNOSIS — N184 Chronic kidney disease, stage 4 (severe): Secondary | ICD-10-CM | POA: Diagnosis not present

## 2018-06-29 ENCOUNTER — Ambulatory Visit (HOSPITAL_COMMUNITY)
Admission: RE | Admit: 2018-06-29 | Discharge: 2018-06-29 | Disposition: A | Discharge status: Home / Self Care | Payer: Medicare HMO | Source: Ambulatory Visit | Attending: Surgery | Admitting: Surgery

## 2018-06-29 ENCOUNTER — Other Ambulatory Visit: Payer: Self-pay | Admitting: *Deleted

## 2018-06-29 ENCOUNTER — Encounter: Payer: Self-pay | Admitting: *Deleted

## 2018-06-29 ENCOUNTER — Ambulatory Visit: Payer: Medicare HMO | Admitting: Surgery

## 2018-06-29 ENCOUNTER — Other Ambulatory Visit: Payer: Self-pay

## 2018-06-29 ENCOUNTER — Ambulatory Visit (INDEPENDENT_AMBULATORY_CARE_PROVIDER_SITE_OTHER)
Admission: RE | Admit: 2018-06-29 | Discharge: 2018-06-29 | Disposition: A | Discharge status: Home / Self Care | Payer: Medicare HMO | Source: Ambulatory Visit | Attending: Surgery | Admitting: Surgery

## 2018-06-29 ENCOUNTER — Encounter: Payer: Self-pay | Admitting: Surgery

## 2018-06-29 VITALS — BP 155/77 | HR 67 | Resp 18 | Ht 62.0 in | Wt 151.0 lb

## 2018-06-29 DIAGNOSIS — N184 Chronic kidney disease, stage 4 (severe): Secondary | ICD-10-CM

## 2018-06-29 NOTE — Progress Notes (Signed)
Reviewed all instructions and answered patient's questions via interpreter.

## 2018-06-29 NOTE — Progress Notes (Signed)
[facility] and [facility]in Specialist of [place]  Patient name: Ryan Wells MRN: [id number] DOB: 03/06/1956 Sex: male   REQUESTING PROVIDER:    Dr. Joelyn Oms   REASON FOR CONSULT:    Renal access  HISTORY OF PRESENT ILLNESS:   Ryan Wells is a 62 y.o. male, who is referred for renal access.  He is Spanish-speaking and was accompanied with an interpreter.  Patient's renal failure is secondary to hypertension and diabetes.  He is right-handed.  PAST MEDICAL HISTORY    Past Medical History:  Diagnosis Date  . 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 AAA (abdominal aortic aneurysm) ([place])   . Chest pain 11/2013   Normal Echo/ EKG/enzymes-hospitalized.  Did not get outpatient stress testing following hospitalization.  No chest pain since  . Chronic kidney disease   . Diabetes type 2, uncontrolled (Ryan Wells) 11/25/2011  . Diabetic gastroparesis ([place]) 01/22/2017  . Microalbuminuria 01/19/2017  . Myocardial infarction [facility])      FAMILY HISTORY   Family History  Problem Relation Age of Onset  . Heart disease Mother        cause of death--CHF?  . Diabetes Father   . Kidney disease Father        Dialysis for kidney failure  . Diabetes Sister   . Diabetes Brother     SOCIAL HISTORY:   Social History   Socioeconomic History  . Marital status: Married    Spouse name: Ryan Wells  . Number of children: 3  . Years of education: 1 year university  . Highest education level: Not on file  Occupational History  . Occupation: unemployed  Social Needs  . Financial resource strain: Not on file  . Food insecurity:    Worry: Not on file    Inability: Not on file  . Transportation needs:    Medical: Not on file    Non-medical: Not on file  Tobacco Use  . Smoking status: Former Smoker    Packs/day: 3.00    Years: 10.00    Pack years: 30.00    Types: Cigarettes    Start date: 08/17/1974    Last attempt to quit: 08/17/1984   Years since quitting: 33.8  . Smokeless tobacco: Never Used  Substance and Sexual Activity  . Alcohol use: No  . Drug use: No  . Sexual activity: Not on file  Lifestyle  . Physical activity:    Days per week: Not on file    Minutes per session: Not on file  . Stress: Not on file  Relationships  . Social connections:    Talks on phone: Not on file    Gets together: Not on file    Attends religious service: Not on file    Active member of club or organization: Not on file    Attends meetings of clubs or organizations: Not on file    Relationship status: Not on file  . Intimate partner violence:    Fear of current or ex partner: Not on file    Emotionally abused: Not on file    Physically abused: Not on file    Forced sexual activity: Not on file  Other Topics Concern  . Not on file  Social History Narrative   Originally from [place]   Came to [employer]. In 1985   Lives in [place] neighborhood with wife and 3 sons.     Sons are 27-30 yo   Some sons help with support  ALLERGIES:    No Known Allergies  CURRENT MEDICATIONS:    Current Outpatient Medications  Medication Sig Dispense Refill  . amLODipine (NORVASC) 10 MG tablet Take 1 tablet (10 mg total) by mouth daily. 30 tablet 0  . calcitRIOL (ROCALTROL) 0.25 MCG capsule Take 1 capsule (0.25 mcg total) by mouth daily. 30 capsule 0  . Cholecalciferol (VITAMIN D3) 50 MCG (2000 UT) TABS Take by mouth.    . furosemide (LASIX) 20 MG tablet Take 1 tablet (20 mg total) by mouth daily. 30 tablet 0  . aspirin EC 81 MG tablet Take 1 tablet (81 mg total) by mouth daily. (Patient not taking: Reported on 12/11/2016)    . cyclobenzaprine (FLEXERIL) 5 MG tablet 1-2 tabs by mouth every 6 hours as needed for back pain (Patient not taking: Reported on 04/08/2018) 30 tablet 0  . levothyroxine (SYNTHROID, LEVOTHROID) 25 MCG tablet Take 1 tablet (25 mcg total) by mouth daily before breakfast. (Patient not taking: Reported on 01/03/2017) 30  tablet 11  . metoCLOPramide (REGLAN) 10 MG tablet 1/2 tab by mouth 4 times daily before meals and at bedtime (Patient not taking: Reported on 07/10/2017) 120 tablet 11   No current facility-administered medications for this visit.     REVIEW OF SYSTEMS:   [X]  denotes positive finding, [ ]  denotes negative finding Cardiac  Comments:  Chest pain or chest pressure:    Shortness of breath upon exertion:    Short of breath when lying flat:    Irregular heart rhythm:        Vascular    Pain in calf, thigh, or hip brought on by ambulation: x   Pain in feet at night that wakes you up from your sleep:  x   Blood clot in your veins:    Leg swelling:  x       Pulmonary    Oxygen at home:    Productive cough:     Wheezing:         Neurologic    Sudden weakness in arms or legs:  x   Sudden numbness in arms or legs:  x   Sudden onset of difficulty speaking or slurred speech:    Temporary loss of vision in one eye:     Problems with dizziness:         Gastrointestinal    Blood in stool:      Vomited blood:         Genitourinary    Burning when urinating:     Blood in urine:        Psychiatric    Major depression:         Hematologic    Bleeding problems:    Problems with blood clotting too easily:        Skin    Rashes or ulcers:        Constitutional    Fever or chills:     PHYSICAL EXAM:   Vitals:   06/29/18 0901  BP: (!) 155/77  Pulse: 67  Resp: 18  SpO2: 100%  Weight: 151 lb (68.5 kg)  Height: 5\' 2"  (1.575 m)    GENERAL: The patient is a well-nourished male, in no acute distress. The vital signs are documented above. CARDIAC: There is a regular rate and rhythm.  VASCULAR: Palpable left brachial and radial pulse PULMONARY: Nonlabored respirations MUSCULOSKELETAL: There are no major deformities or cyanosis. NEUROLOGIC: No focal weakness or paresthesias are detected. SKIN: There are no ulcers or  rashes noted. PSYCHIATRIC: The patient has a normal  affect.  STUDIES:   I have ordered and reviewed his vascular lab studies with the following findings Left Cephalic  Diameter (cm)Depth (cm)Findings +-----------------+-------------+----------+--------+ Shoulder       0.27             +-----------------+-------------+----------+--------+ Prox upper arm    0.23             +-----------------+-------------+----------+--------+ Mid upper arm    0.24             +-----------------+-------------+----------+--------+ Dist upper arm    0.27             +-----------------+-------------+----------+--------+ Antecubital fossa  0.29             +-----------------+-------------+----------+--------+ Prox forearm     0.29             +-----------------+-------------+----------+--------+ Mid forearm     0.30             +-----------------+-------------+----------+--------+ Dist forearm     0.22             +-----------------+-------------+----------+--------+  +-----------------+-------------+----------+--------+ Left Basilic   Diameter (cm)Depth (cm)Findings +-----------------+-------------+----------+--------+ Prox upper arm    0.31             +-----------------+-------------+----------+--------+ Mid upper arm    0.33             +-----------------+-------------+----------+--------+ Dist upper arm    0.31             +-----------------+-------------+----------+--------+ Antecubital fossa  0.34             +-----------------+-------------+----------+--------+ Prox forearm     0.21             +-----------------+-------------+----------+--------+ ASSESSMENT and PLAN   Chronic renal insufficiency: I discussed proceeding with a left brachiocephalic fistula versus a first stage  basilic vein fistula.  I discussed the risks and benefits the procedure include the risk of not maturity and the need for additional operations.  All his questions were answered.  His operation will be scheduled for this Thursday, November 21.   [name], MD [facility] Specialists of [facility] [phone] Pager [phone]

## 2018-06-29 NOTE — H&P (View-Only) (Signed)
Vascular and Vein Specialist of [place]  Patient name: Ryan Wells MRN: [id number] DOB: [birth year] Sex: male   REQUESTING PROVIDER:    Dr. Joelyn Oms   REASON FOR CONSULT:    Renal access  HISTORY OF PRESENT ILLNESS:   Ryan Wells is a 62 y.o. male, who is referred for renal access.  He is Spanish-speaking and was accompanied with an interpreter.  Patient's renal failure is secondary to hypertension and diabetes.  He is right-handed.  PAST MEDICAL HISTORY    Past Medical History:  Diagnosis Date  . 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 AAA (abdominal aortic aneurysm) ([place])   . Chest pain 11/2013   Normal Echo/ EKG/enzymes-hospitalized.  Did not get outpatient stress testing following hospitalization.  No chest pain since  . Chronic kidney disease   . Diabetes type 2, uncontrolled ([place]) 11/25/2011  . Diabetic gastroparesis ([place]) 01/22/2017  . Microalbuminuria 01/19/2017  . Myocardial infarction [facility])      FAMILY HISTORY   Family History  Problem Relation Age of Onset  . Heart disease Mother        cause of death--CHF?  . Diabetes Father   . Kidney disease Father        Dialysis for kidney failure  . Diabetes Sister   . Diabetes Brother     SOCIAL HISTORY:   Social History   Socioeconomic History  . Marital status: Married    Spouse name: Mariana Arn  . Number of children: 3  . Years of education: 1 year university  . Highest education level: Not on file  Occupational History  . Occupation: unemployed  Social Needs  . Financial resource strain: Not on file  . Food insecurity:    Worry: Not on file    Inability: Not on file  . Transportation needs:    Medical: Not on file    Non-medical: Not on file  Tobacco Use  . Smoking status: Former Smoker    Packs/day: 3.00    Years: 10.00    Pack years: 30.00    Types: Cigarettes    Start date: 08/17/1974    Last attempt to quit: 08/17/1984   Years since quitting: 33.8  . Smokeless tobacco: Never Used  Substance and Sexual Activity  . Alcohol use: No  . Drug use: No  . Sexual activity: Not on file  Lifestyle  . Physical activity:    Days per week: Not on file    Minutes per session: Not on file  . Stress: Not on file  Relationships  . Social connections:    Talks on phone: Not on file    Gets together: Not on file    Attends religious service: Not on file    Active member of club or organization: Not on file    Attends meetings of clubs or organizations: Not on file    Relationship status: Not on file  . Intimate partner violence:    Fear of current or ex partner: Not on file    Emotionally abused: Not on file    Physically abused: Not on file    Forced sexual activity: Not on file  Other Topics Concern  . Not on file  Social History Narrative   Originally from [place]   Came to [employer]. In 1985   Lives in [place] neighborhood with wife and 3 sons.     Sons are 51-30 yo   Some sons help with support  ALLERGIES:    No Known Allergies  CURRENT MEDICATIONS:    Current Outpatient Medications  Medication Sig Dispense Refill  . amLODipine (NORVASC) 10 MG tablet Take 1 tablet (10 mg total) by mouth daily. 30 tablet 0  . calcitRIOL (ROCALTROL) 0.25 MCG capsule Take 1 capsule (0.25 mcg total) by mouth daily. 30 capsule 0  . Cholecalciferol (VITAMIN D3) 50 MCG (2000 UT) TABS Take by mouth.    . furosemide (LASIX) 20 MG tablet Take 1 tablet (20 mg total) by mouth daily. 30 tablet 0  . aspirin EC 81 MG tablet Take 1 tablet (81 mg total) by mouth daily. (Patient not taking: Reported on 12/11/2016)    . cyclobenzaprine (FLEXERIL) 5 MG tablet 1-2 tabs by mouth every 6 hours as needed for back pain (Patient not taking: Reported on 04/08/2018) 30 tablet 0  . levothyroxine (SYNTHROID, LEVOTHROID) 25 MCG tablet Take 1 tablet (25 mcg total) by mouth daily before breakfast. (Patient not taking: Reported on 01/03/2017) 30  tablet 11  . metoCLOPramide (REGLAN) 10 MG tablet 1/2 tab by mouth 4 times daily before meals and at bedtime (Patient not taking: Reported on 07/10/2017) 120 tablet 11   No current facility-administered medications for this visit.     REVIEW OF SYSTEMS:   [X]  denotes positive finding, [ ]  denotes negative finding Cardiac  Comments:  Chest pain or chest pressure:    Shortness of breath upon exertion:    Short of breath when lying flat:    Irregular heart rhythm:        Vascular    Pain in calf, thigh, or hip brought on by ambulation: x   Pain in feet at night that wakes you up from your sleep:  x   Blood clot in your veins:    Leg swelling:  x       Pulmonary    Oxygen at home:    Productive cough:     Wheezing:         Neurologic    Sudden weakness in arms or legs:  x   Sudden numbness in arms or legs:  x   Sudden onset of difficulty speaking or slurred speech:    Temporary loss of vision in one eye:     Problems with dizziness:         Gastrointestinal    Blood in stool:      Vomited blood:         Genitourinary    Burning when urinating:     Blood in urine:        Psychiatric    Major depression:         Hematologic    Bleeding problems:    Problems with blood clotting too easily:        Skin    Rashes or ulcers:        Constitutional    Fever or chills:     PHYSICAL EXAM:   Vitals:   06/29/18 0901  BP: (!) 155/77  Pulse: 67  Resp: 18  SpO2: 100%  Weight: 151 lb (68.5 kg)  Height: 5\' 2"  (1.575 m)    GENERAL: The patient is a well-nourished male, in no acute distress. The vital signs are documented above. CARDIAC: There is a regular rate and rhythm.  VASCULAR: Palpable left brachial and radial pulse PULMONARY: Nonlabored respirations MUSCULOSKELETAL: There are no major deformities or cyanosis. NEUROLOGIC: No focal weakness or paresthesias are detected. SKIN: There are no ulcers or  rashes noted. PSYCHIATRIC: The patient has a normal  affect.  STUDIES:   I have ordered and reviewed his vascular lab studies with the following findings Left Cephalic  Diameter (cm)Depth (cm)Findings +-----------------+-------------+----------+--------+ Shoulder       0.27             +-----------------+-------------+----------+--------+ Prox upper arm    0.23             +-----------------+-------------+----------+--------+ Mid upper arm    0.24             +-----------------+-------------+----------+--------+ Dist upper arm    0.27             +-----------------+-------------+----------+--------+ Antecubital fossa  0.29             +-----------------+-------------+----------+--------+ Prox forearm     0.29             +-----------------+-------------+----------+--------+ Mid forearm     0.30             +-----------------+-------------+----------+--------+ Dist forearm     0.22             +-----------------+-------------+----------+--------+  +-----------------+-------------+----------+--------+ Left Basilic   Diameter (cm)Depth (cm)Findings +-----------------+-------------+----------+--------+ Prox upper arm    0.31             +-----------------+-------------+----------+--------+ Mid upper arm    0.33             +-----------------+-------------+----------+--------+ Dist upper arm    0.31             +-----------------+-------------+----------+--------+ Antecubital fossa  0.34             +-----------------+-------------+----------+--------+ Prox forearm     0.21             +-----------------+-------------+----------+--------+ ASSESSMENT and PLAN   Chronic renal insufficiency: I discussed proceeding with a left brachiocephalic fistula versus a first stage  basilic vein fistula.  I discussed the risks and benefits the procedure include the risk of not maturity and the need for additional operations.  All his questions were answered.  His operation will be scheduled for this Thursday, November 21.   [name], MD [facility]ascular and Vein Specialists of [facility] [phone] Pager [phone]

## 2018-06-30 ENCOUNTER — Encounter (HOSPITAL_COMMUNITY): Payer: Self-pay | Admitting: *Deleted

## 2018-06-30 NOTE — Progress Notes (Addendum)
Denies chest pain, shob, or cardiology visit. Contact [facility] patient's PCP and they do not have EKG on file. States he does not check CBG

## 2018-07-01 ENCOUNTER — Encounter (HOSPITAL_COMMUNITY): Payer: Self-pay | Admitting: Physician Assistant

## 2018-07-02 ENCOUNTER — Encounter (HOSPITAL_COMMUNITY)
Admission: RE | Disposition: A | Discharge status: Home / Self Care | Payer: Self-pay | Source: Ambulatory Visit | Attending: Surgery

## 2018-07-02 ENCOUNTER — Ambulatory Visit (HOSPITAL_COMMUNITY): Payer: Medicare HMO | Admitting: Physician Assistant

## 2018-07-02 ENCOUNTER — Other Ambulatory Visit: Payer: Self-pay

## 2018-07-02 ENCOUNTER — Encounter (HOSPITAL_COMMUNITY): Payer: Self-pay | Admitting: *Deleted

## 2018-07-02 ENCOUNTER — Ambulatory Visit (HOSPITAL_COMMUNITY)
Admission: RE | Admit: 2018-07-02 | Discharge: 2018-07-02 | Disposition: A | Discharge status: Home / Self Care | Payer: Medicare HMO | Source: Ambulatory Visit | Attending: Surgery | Admitting: Surgery

## 2018-07-02 DIAGNOSIS — I714 Abdominal aortic aneurysm, without rupture: Secondary | ICD-10-CM | POA: Insufficient documentation

## 2018-07-02 DIAGNOSIS — Z841 Family history of disorders of kidney and ureter: Secondary | ICD-10-CM | POA: Diagnosis not present

## 2018-07-02 DIAGNOSIS — I252 Old myocardial infarction: Secondary | ICD-10-CM | POA: Insufficient documentation

## 2018-07-02 DIAGNOSIS — I129 Hypertensive chronic kidney disease with stage 1 through stage 4 chronic kidney disease, or unspecified chronic kidney disease: Secondary | ICD-10-CM | POA: Diagnosis not present

## 2018-07-02 DIAGNOSIS — I120 Hypertensive chronic kidney disease with stage 5 chronic kidney disease or end stage renal disease: Secondary | ICD-10-CM | POA: Insufficient documentation

## 2018-07-02 DIAGNOSIS — E1122 Type 2 diabetes mellitus with diabetic chronic kidney disease: Secondary | ICD-10-CM | POA: Insufficient documentation

## 2018-07-02 DIAGNOSIS — Z79899 Other long term (current) drug therapy: Secondary | ICD-10-CM | POA: Insufficient documentation

## 2018-07-02 DIAGNOSIS — E039 Hypothyroidism, unspecified: Secondary | ICD-10-CM | POA: Diagnosis not present

## 2018-07-02 DIAGNOSIS — N185 Chronic kidney disease, stage 5: Secondary | ICD-10-CM | POA: Diagnosis not present

## 2018-07-02 DIAGNOSIS — E1143 Type 2 diabetes mellitus with diabetic autonomic (poly)neuropathy: Secondary | ICD-10-CM | POA: Insufficient documentation

## 2018-07-02 DIAGNOSIS — N186 End stage renal disease: Secondary | ICD-10-CM | POA: Insufficient documentation

## 2018-07-02 DIAGNOSIS — Z87891 Personal history of nicotine dependence: Secondary | ICD-10-CM | POA: Insufficient documentation

## 2018-07-02 DIAGNOSIS — K3184 Gastroparesis: Secondary | ICD-10-CM | POA: Diagnosis not present

## 2018-07-02 DIAGNOSIS — N184 Chronic kidney disease, stage 4 (severe): Secondary | ICD-10-CM | POA: Diagnosis not present

## 2018-07-02 HISTORY — PX: AV FISTULA PLACEMENT: SHX1204

## 2018-07-02 LAB — POCT I-STAT 4, (NA,K, GLUC, HGB,HCT)
Glucose, Bld: 151 mg/dL — ABNORMAL HIGH (ref 70–99)
HCT: 27 % — ABNORMAL LOW (ref 39.0–52.0)
Hemoglobin: 9.2 g/dL — ABNORMAL LOW (ref 13.0–17.0)
Potassium: 4.4 mmol/L (ref 3.5–5.1)
Sodium: 139 mmol/L (ref 135–145)

## 2018-07-02 LAB — GLUCOSE, CAPILLARY
Glucose-Capillary: 150 mg/dL — ABNORMAL HIGH (ref 70–99)
Glucose-Capillary: 124 mg/dL — ABNORMAL HIGH (ref 70–99)
Glucose-Capillary: 131 mg/dL — ABNORMAL HIGH (ref 70–99)

## 2018-07-02 SURGERY — ARTERIOVENOUS (AV) FISTULA CREATION
Anesthesia: Monitor Anesthesia Care | Laterality: Left

## 2018-07-02 MED ORDER — SODIUM CHLORIDE 0.9 % IV SOLN
INTRAVENOUS | Status: DC | PRN
  Administered 2018-07-02: 10:00:00

## 2018-07-02 MED ORDER — PROPOFOL 500 MG/50ML IV EMUL
INTRAVENOUS | Status: DC | PRN
  Administered 2018-07-02: 100 ug/kg/min via INTRAVENOUS

## 2018-07-02 MED ORDER — PROPOFOL 10 MG/ML IV BOLUS
INTRAVENOUS | Status: DC | PRN
  Administered 2018-07-02: 20 mg via INTRAVENOUS

## 2018-07-02 MED ORDER — LIDOCAINE-EPINEPHRINE (PF) 1 %-1:200000 IJ SOLN
INTRAMUSCULAR | Status: DC | PRN
  Administered 2018-07-02: 6 mL

## 2018-07-02 MED ORDER — CHLORHEXIDINE GLUCONATE 4 % EX LIQD: 60.0000 mL | Freq: Once | CUTANEOUS | Status: DC

## 2018-07-02 MED ORDER — SODIUM CHLORIDE 0.9 % IV SOLN
INTRAVENOUS | Status: DC | PRN
  Administered 2018-07-02: 20 ug/min via INTRAVENOUS

## 2018-07-02 MED ORDER — FENTANYL CITRATE (PF) 250 MCG/5ML IJ SOLN
INTRAMUSCULAR | Status: AC
  Filled 2018-07-02: qty 5

## 2018-07-02 MED ORDER — ONDANSETRON HCL 4 MG/2ML IJ SOLN
INTRAMUSCULAR | Status: DC | PRN
  Administered 2018-07-02: 4 mg via INTRAVENOUS

## 2018-07-02 MED ORDER — 0.9 % SODIUM CHLORIDE (POUR BTL) OPTIME
TOPICAL | Status: DC | PRN
  Administered 2018-07-02: 1000 mL

## 2018-07-02 MED ORDER — CEFAZOLIN SODIUM-DEXTROSE 2-4 GM/100ML-% IV SOLN
2.0000 g | INTRAVENOUS | Status: AC
  Administered 2018-07-02: 2 g via INTRAVENOUS
  Filled 2018-07-02: qty 100

## 2018-07-02 MED ORDER — PROPOFOL 10 MG/ML IV BOLUS
INTRAVENOUS | Status: AC
  Filled 2018-07-02: qty 20

## 2018-07-02 MED ORDER — LIDOCAINE-EPINEPHRINE (PF) 1 %-1:200000 IJ SOLN
INTRAMUSCULAR | Status: AC
  Filled 2018-07-02: qty 30

## 2018-07-02 MED ORDER — SODIUM CHLORIDE 0.9 % IV SOLN: INTRAVENOUS | Status: DC

## 2018-07-02 MED ORDER — FENTANYL CITRATE (PF) 100 MCG/2ML IJ SOLN
INTRAMUSCULAR | Status: DC | PRN
  Administered 2018-07-02: 50 ug via INTRAVENOUS

## 2018-07-02 MED ORDER — PHENYLEPHRINE 40 MCG/ML (10ML) SYRINGE FOR IV PUSH (FOR BLOOD PRESSURE SUPPORT)
PREFILLED_SYRINGE | INTRAVENOUS | Status: AC
  Filled 2018-07-02: qty 10

## 2018-07-02 MED ORDER — HYDROCODONE-ACETAMINOPHEN 5-325 MG PO TABS: 1.0000 | ORAL_TABLET | Freq: Four times a day (QID) | ORAL | 0 refills | Status: DC | PRN

## 2018-07-02 MED ORDER — SODIUM CHLORIDE 0.9 % IV SOLN
INTRAVENOUS | Status: AC
  Filled 2018-07-02: qty 1.2

## 2018-07-02 MED ORDER — SODIUM CHLORIDE 0.9 % IV SOLN
INTRAVENOUS | Status: DC
  Administered 2018-07-02: 08:00:00 via INTRAVENOUS

## 2018-07-02 MED ORDER — ONDANSETRON HCL 4 MG/2ML IJ SOLN
INTRAMUSCULAR | Status: AC
  Filled 2018-07-02: qty 2

## 2018-07-02 MED ORDER — LIDOCAINE HCL (CARDIAC) PF 100 MG/5ML IV SOSY
PREFILLED_SYRINGE | INTRAVENOUS | Status: DC | PRN
  Administered 2018-07-02: 60 mg via INTRATRACHEAL

## 2018-07-02 SURGICAL SUPPLY — 31 items
ADH SKN CLS APL DERMABOND .7 (GAUZE/BANDAGES/DRESSINGS) ×1
ARMBAND PINK RESTRICT EXTREMIT (MISCELLANEOUS) ×4 IMPLANT
CANISTER SUCT 3000ML PPV (MISCELLANEOUS) ×2 IMPLANT
CLIP VESOCCLUDE MED 6/CT (CLIP) ×2 IMPLANT
CLIP VESOCCLUDE SM WIDE 6/CT (CLIP) ×2 IMPLANT
COVER PROBE W GEL 5X96 (DRAPES) ×2 IMPLANT
COVER WAND RF STERILE (DRAPES) ×2 IMPLANT
DERMABOND ADVANCED (GAUZE/BANDAGES/DRESSINGS) ×1
DERMABOND ADVANCED .7 DNX12 (GAUZE/BANDAGES/DRESSINGS) ×1 IMPLANT
ELECT REM PT RETURN 9FT ADLT (ELECTROSURGICAL) ×2
ELECTRODE REM PT RTRN 9FT ADLT (ELECTROSURGICAL) ×1 IMPLANT
GLOVE BIOGEL PI IND STRL 7.5 (GLOVE) ×1 IMPLANT
GLOVE BIOGEL PI INDICATOR 7.5 (GLOVE) ×1
GLOVE SURG SS PI 7.5 STRL IVOR (GLOVE) ×2 IMPLANT
GOWN STRL REUS W/ TWL LRG LVL3 (GOWN DISPOSABLE) ×2 IMPLANT
GOWN STRL REUS W/ TWL XL LVL3 (GOWN DISPOSABLE) ×1 IMPLANT
GOWN STRL REUS W/TWL LRG LVL3 (GOWN DISPOSABLE) ×4
GOWN STRL REUS W/TWL XL LVL3 (GOWN DISPOSABLE) ×2
HEMOSTAT SNOW SURGICEL 2X4 (HEMOSTASIS) IMPLANT
KIT BASIN OR (CUSTOM PROCEDURE TRAY) ×2 IMPLANT
KIT TURNOVER KIT B (KITS) ×2 IMPLANT
NS IRRIG 1000ML POUR BTL (IV SOLUTION) ×2 IMPLANT
PACK CV ACCESS (CUSTOM PROCEDURE TRAY) ×2 IMPLANT
PAD ARMBOARD 7.5X6 YLW CONV (MISCELLANEOUS) ×4 IMPLANT
SUT PROLENE 6 0 CC (SUTURE) ×2 IMPLANT
SUT VIC AB 3-0 SH 27 (SUTURE) ×2
SUT VIC AB 3-0 SH 27X BRD (SUTURE) ×1 IMPLANT
SUT VICRYL 4-0 PS2 18IN ABS (SUTURE) ×2 IMPLANT
TOWEL GREEN STERILE (TOWEL DISPOSABLE) ×2 IMPLANT
UNDERPAD 30X30 (UNDERPADS AND DIAPERS) ×2 IMPLANT
WATER STERILE IRR 1000ML POUR (IV SOLUTION) ×2 IMPLANT

## 2018-07-02 NOTE — Interval H&P Note (Signed)
History and Physical Interval Note:  07/02/2018 9:42 AM  Ryan Wells Scrape  has presented today for surgery, with the diagnosis of CHRONIC KIDNEY DISEASE FOR HEMODIALYSIS ACCESS  The various methods of treatment have been discussed with the patient and family. After consideration of risks, benefits and other options for treatment, the patient has consented to  Procedure(s): ARTERIOVENOUS (AV) FISTULA CREATION LEFT ARM (Left) as a surgical intervention .  The patient's history has been reviewed, patient examined, no change in status, stable for surgery.  I have reviewed the patient's chart and labs.  Questions were answered to the patient's satisfaction.     [name]

## 2018-07-02 NOTE — Transfer of Care (Signed)
Immediate Anesthesia Transfer of Care Note  Patient: Ryan Wells  Procedure(s) Performed: BRACHIOCEPHALIC ARTERIOVENOUS (AV) FISTULA CREATION LEFT ARM (Left )  Patient Location: PACU  Anesthesia Type:MAC  Level of Consciousness: awake, alert  and oriented  Airway & Oxygen Therapy: Patient Spontanous Breathing  Post-op Assessment: Report given to RN and Post -op Vital signs reviewed and stable  Post vital signs: Reviewed and stable  Last Vitals:  Vitals Value Taken Time  BP 122/70 07/02/2018 11:51 AM  Temp 36.6 C 07/02/2018 11:51 AM  Pulse 66 07/02/2018 11:54 AM  Resp 12 07/02/2018 11:54 AM  SpO2 100 % 07/02/2018 11:54 AM  Vitals shown include unvalidated device data.  Last Pain:  Vitals:   07/02/18 0746  TempSrc:   PainSc: 0-No pain      Patients Stated Pain Goal: 2 (04/54/09 8119)  Complications: No apparent anesthesia complications

## 2018-07-02 NOTE — Anesthesia Preprocedure Evaluation (Signed)
Anesthesia Evaluation  Patient identified by MRN, date of birth, ID band Patient awake    Reviewed: Allergy & Precautions, NPO status , Patient's Chart, lab work & pertinent test results  History of Anesthesia Complications Negative for: history of anesthetic complications  Airway Mallampati: I  TM Distance: >3 FB Neck ROM: Full    Dental  (+) Missing, Dental Advisory Given,    Pulmonary neg shortness of breath, neg COPD, neg recent URI, former smoker,    breath sounds clear to auscultation       Cardiovascular hypertension, Pt. on medications (-) angina+ Past MI   Rhythm:Regular     Neuro/Psych  Neuromuscular disease negative psych ROS   GI/Hepatic negative GI ROS, Neg liver ROS,   Endo/Other  diabetesHypothyroidism   Renal/GU CRFRenal disease     Musculoskeletal negative musculoskeletal ROS (+)   Abdominal   Peds  Hematology  (+) anemia ,   Anesthesia Other Findings   Reproductive/Obstetrics                             Anesthesia Physical Anesthesia Plan  ASA: III  Anesthesia Plan: MAC   Post-op Pain Management:    Induction: Intravenous  PONV Risk Score and Plan: 1 and Treatment may vary due to age or medical condition and Propofol infusion  Airway Management Planned: Nasal Cannula  Additional Equipment: None  Intra-op Plan:   Post-operative Plan:   Informed Consent: I have reviewed the patients History and Physical, chart, labs and discussed the procedure including the risks, benefits and alternatives for the proposed anesthesia with the patient or authorized representative who has indicated his/her understanding and acceptance.   Dental advisory given  Plan Discussed with: Surgeon and CRNA  Anesthesia Plan Comments: (Used interpreter )        Anesthesia Quick Evaluation

## 2018-07-02 NOTE — Anesthesia Postprocedure Evaluation (Signed)
Anesthesia Post Note  Patient: Ryan Wells  Procedure(s) Performed: BRACHIOCEPHALIC ARTERIOVENOUS (AV) FISTULA CREATION LEFT ARM (Left )     Patient location during evaluation: PACU Anesthesia Type: MAC Level of consciousness: awake and alert Pain management: pain level controlled Vital Signs Assessment: post-procedure vital signs reviewed and stable Respiratory status: spontaneous breathing, nonlabored ventilation, respiratory function stable and patient connected to nasal cannula oxygen Cardiovascular status: stable and blood pressure returned to baseline Postop Assessment: no apparent nausea or vomiting Anesthetic complications: no    Last Vitals:  Vitals:   07/02/18 1222 07/02/18 1227  BP:    Pulse: 65 63  Resp: 13   Temp: (!) 36.4 C   SpO2: 100% 98%    Last Pain:  Vitals:   07/02/18 1222  TempSrc:   PainSc: 0-No pain                 [name]

## 2018-07-02 NOTE — Op Note (Signed)
    Patient name: Ryan Wells MRN: [id number] DOB: Mar 02, 1956 Sex: male  07/02/2018 Pre-operative Diagnosis: ESRD Post-operative diagnosis:  Same Surgeon:  [name] Assistants:  Arlee Muslim Procedure:   Left brachiocephalic fistula Anesthesia: MAC Blood Loss: Minimal Specimens: None  Findings: Healthy 3 mm vein was dilated to 4-5 mm after fistula creation.  The brachial artery was 3 mm and healthy.  Indications: The patient comes in for fistula creation.  The procedure was discussed in detail via a Spanish interpreter.  All questions were answered.  Procedure:  The patient was identified in the holding area and taken to [address]  The patient was then placed supine on the table. MAC anesthesia was administered.  The patient was prepped and draped in the usual sterile fashion.  A time out was called and antibiotics were administered.  Ultrasound was used to evaluate the basilic and cephalic vein in the upper arm.  The cephalic vein appeared to be an adequate vein measuring 3 mm throughout the upper arm.  I elected to proceed with a brachiocephalic fistula.  1% lidocaine was used for local anesthesia.  A transverse incision was made at the antecubital crease.  I first dissected out the brachial artery.  This was a 3-4 mm disease-free artery.  It was encircled proximally distally with Vesseloops.  I then dissected out the median cubital branch of the cephalic vein.  This was a 3 mm vein.  It was fully mobilized.  Multiple side branches were ligated between silk ties including the distal cephalic vein.  The vein was then marked for orientation and ligated distally.  The vein distended nicely with heparin saline.  Next, the brachial artery was occluded with vascular clamps.  A #11 blade was used to make an arteriotomy which was extended longitudinally with Potts scissors.  The vein was then spatulated to fit the size the arteriotomy.  A running end-to-side anastomosis was then created with  6-0 Prolene.  Prior to completion the appropriate flushing maneuvers were performed and the anastomosis was completed.  There was an excellent thrill within the fistula and brisk radial and ulnar Doppler signals.  I inspected the course of the vein to make sure there are no kinks.  Once hemostasis was satisfactory, the incision was closed with 2 layers of 3-0 Vicryl followed by Dermabond.  There were no immediate complications.   Disposition: To PACU stable.   Theotis Burrow, M.D. [facility] [place] Office: [phone] Pager:  [phone]

## 2018-07-02 NOTE — Discharge Instructions (Signed)
° °  [facility] [place] ° °Discharge Instructions ° °AV Fistula or Graft Surgery for Dialysis Access ° °Please refer to the following instructions for your post-procedure care. Your surgeon or physician assistant will discuss any changes with you. ° °Activity ° °You may drive the day following your surgery, if you are comfortable and no longer taking prescription pain medication. Resume full activity as the soreness in your incision resolves. ° °Bathing/Showering ° °You may shower after you go home. Keep your incision dry for 48 hours. Do not soak in a bathtub, hot tub, or swim until the incision heals completely. You may not shower if you have a hemodialysis catheter. ° °Incision Care ° °Clean your incision with mild soap and water after 48 hours. Pat the area dry with a clean towel. You do not need a bandage unless otherwise instructed. Do not apply any ointments or creams to your incision. You may have skin glue on your incision. Do not peel it off. It will come off on its own in about one week. Your arm may swell a bit after surgery. To reduce swelling use pillows to elevate your arm so it is above your heart. Your doctor will tell you if you need to lightly wrap your arm with an ACE bandage. ° °Diet ° °Resume your normal diet. There are not special food restrictions following this procedure. In order to heal from your surgery, it is CRITICAL to get adequate nutrition. Your body requires vitamins, minerals, and protein. Vegetables are the best source of vitamins and minerals. Vegetables also provide the perfect balance of protein. Processed food has little nutritional value, so try to avoid this. ° °Medications ° °Resume taking all of your medications. If your incision is causing pain, you may take over-the counter pain relievers such as acetaminophen (Tylenol). If you were prescribed a stronger pain medication, please be aware these medications can cause nausea and constipation. Prevent  nausea by taking the medication with a snack or meal. Avoid constipation by drinking plenty of fluids and eating foods with high amount of fiber, such as fruits, vegetables, and grains. Do not take Tylenol if you are taking prescription pain medications. ° ° ° ° °Follow up °Your surgeon may want to see you in the office following your access surgery. If so, this will be arranged at the time of your surgery. ° °Please call us immediately for any of the following conditions: ° °Increased pain, redness, drainage (pus) from your incision site °Fever of 101 degrees or higher °Severe or worsening pain at your incision site °Hand pain or numbness. ° °Reduce your risk of vascular disease: ° °Stop smoking. If you would like help, call [facility] at 1-800-QUIT-NOW (1-800-784-8669) or [place] at 336-586-4000 ° °Manage your cholesterol °Maintain a desired weight °Control your diabetes °Keep your blood pressure down ° °Dialysis ° °It will take several weeks to several months for your new dialysis access to be ready for use. Your surgeon will determine when it is OK to use it. Your nephrologist will continue to direct your dialysis. You can continue to use your Permcath until your new access is ready for use. ° °If you have any questions, please call the office at [phone]. ° °

## 2018-07-03 ENCOUNTER — Encounter (HOSPITAL_COMMUNITY): Payer: Self-pay | Admitting: Surgery

## 2018-07-20 ENCOUNTER — Other Ambulatory Visit: Payer: Self-pay

## 2018-07-20 DIAGNOSIS — N184 Chronic kidney disease, stage 4 (severe): Secondary | ICD-10-CM

## 2018-08-07 NOTE — Progress Notes (Signed)
POST OPERATIVE OFFICE NOTE    CC:  F/u for surgery  HPI:  This is a 62 y.o. male who is s/p left brachiocephalic AVF on 37/62/83 by Dr. Trula Slade.  He is not yet on dialysis.  He has hx of HTN and DM.    Via interpreter on the telephone, pt denies pain in his hand.     No Known Allergies  Current Outpatient Medications  Medication Sig Dispense Refill  . amLODipine (NORVASC) 10 MG tablet Take 1 tablet (10 mg total) by mouth daily. (Patient not taking: Reported on 06/29/2018) 30 tablet 0  . amLODipine (NORVASC) 5 MG tablet Take 5 mg by mouth every evening.  11  . calcitRIOL (ROCALTROL) 0.25 MCG capsule Take 1 capsule (0.25 mcg total) by mouth daily. (Patient taking differently: Take 0.25 mcg by mouth every evening. ) 30 capsule 0  . Cholecalciferol (VITAMIN D3) 50 MCG (2000 UT) TABS Take 2,000 Units by mouth daily.     . furosemide (LASIX) 20 MG tablet Take 1 tablet (20 mg total) by mouth daily. (Patient taking differently: Take 20 mg by mouth every evening. ) 30 tablet 0  . HYDROcodone-acetaminophen (NORCO) 5-325 MG tablet Take 1 tablet by mouth every 6 (six) hours as needed for moderate pain. 10 tablet 0   No current facility-administered medications for this visit.      ROS:  See HPI  Physical Exam:  Today's Vitals   08/10/18 1503  BP: (!) 141/62  Pulse: 69  Resp: 20  Temp: 98.4 F (36.9 C)  SpO2: 98%  Weight: 151 lb (68.5 kg)  Height: 5\' 2"  (1.575 m)   Body mass index is 27.62 kg/m.  Incision:  Well healed Extremities:  Easily palpable left radial pulse; excellent thrill and bruit throughout and easily palpable fistula    Dialysis duplex 08/10/18: Findings: +--------------------+----------+-----------------+--------+ AVF                 PSV (cm/s)Flow Vol (mL/min)Comments +--------------------+----------+-----------------+--------+ Native artery inflow   181           941                 +--------------------+----------+-----------------+--------+ AVF Anastomosis        367                              +--------------------+----------+-----------------+--------+    +------------+----------+-------------+----------+-------------+ OUTFLOW VEINPSV (cm/s)Diameter (cm)Depth (cm)  Describe    +------------+----------+-------------+----------+-------------+ Prox UA        229        0.33        1.07   0.26cm branch +------------+----------+-------------+----------+-------------+ Mid UA                    0.53        0.29                 +------------+----------+-------------+----------+-------------+ Dist UA        206        0.47        0.31                 +------------+----------+-------------+----------+-------------+ AC Fossa       367        0.57        0.23            Assessment/Plan:  This is a 62 y.o. male who is s/p: Left brachiocephalic AVF on  07/02/18 by Dr. Trula Slade  -pt does not have evidence of steal sx.  -pt's fistula is maturing nicely with exception of one area in the proximal upper arm measuring 0.33cm.  Since the pt is not yet on dialysis, discussed with pt returning in 5 weeks with repeat duplex to see if fistula is maturing in the upper arm.  He is in agreement with this plan. -he wanted to know if this would disrupt him going to [facility] let him know that from our standpoint, this would not be a problem.   Leontine Locket, PA-C [facility] [phone]  Clinic MD:  Donzetta Matters

## 2018-08-10 ENCOUNTER — Other Ambulatory Visit: Payer: Self-pay

## 2018-08-10 ENCOUNTER — Ambulatory Visit (INDEPENDENT_AMBULATORY_CARE_PROVIDER_SITE_OTHER): Payer: Self-pay | Admitting: Physician Assistant

## 2018-08-10 ENCOUNTER — Ambulatory Visit (HOSPITAL_COMMUNITY)
Admission: RE | Admit: 2018-08-10 | Discharge: 2018-08-10 | Disposition: A | Discharge status: Home / Self Care | Payer: Medicare HMO | Source: Ambulatory Visit | Attending: Internal Medicine | Admitting: Internal Medicine

## 2018-08-10 VITALS — BP 141/62 | HR 69 | Temp 98.4°F | Resp 20 | Ht 62.0 in | Wt 151.0 lb

## 2018-08-10 DIAGNOSIS — N184 Chronic kidney disease, stage 4 (severe): Secondary | ICD-10-CM | POA: Diagnosis not present

## 2018-08-17 DIAGNOSIS — N184 Chronic kidney disease, stage 4 (severe): Secondary | ICD-10-CM | POA: Diagnosis not present

## 2018-08-27 DIAGNOSIS — I120 Hypertensive chronic kidney disease with stage 5 chronic kidney disease or end stage renal disease: Secondary | ICD-10-CM | POA: Diagnosis not present

## 2018-08-27 DIAGNOSIS — N2581 Secondary hyperparathyroidism of renal origin: Secondary | ICD-10-CM | POA: Diagnosis not present

## 2018-08-27 DIAGNOSIS — D631 Anemia in chronic kidney disease: Secondary | ICD-10-CM | POA: Diagnosis not present

## 2018-08-27 DIAGNOSIS — N185 Chronic kidney disease, stage 5: Secondary | ICD-10-CM | POA: Diagnosis not present

## 2018-08-27 DIAGNOSIS — E559 Vitamin D deficiency, unspecified: Secondary | ICD-10-CM | POA: Diagnosis not present

## 2018-09-11 ENCOUNTER — Other Ambulatory Visit: Payer: Self-pay

## 2018-09-11 DIAGNOSIS — N184 Chronic kidney disease, stage 4 (severe): Secondary | ICD-10-CM

## 2018-09-16 ENCOUNTER — Ambulatory Visit (INDEPENDENT_AMBULATORY_CARE_PROVIDER_SITE_OTHER): Payer: Self-pay | Admitting: Physician Assistant

## 2018-09-16 ENCOUNTER — Other Ambulatory Visit: Payer: Self-pay

## 2018-09-16 ENCOUNTER — Ambulatory Visit (HOSPITAL_COMMUNITY)
Admission: RE | Admit: 2018-09-16 | Discharge: 2018-09-16 | Disposition: A | Discharge status: Home / Self Care | Payer: Medicare HMO | Source: Ambulatory Visit | Attending: Family | Admitting: Family

## 2018-09-16 VITALS — BP 153/71 | HR 71 | Temp 98.0°F | Resp 20 | Ht 62.0 in | Wt 151.0 lb

## 2018-09-16 DIAGNOSIS — N184 Chronic kidney disease, stage 4 (severe): Secondary | ICD-10-CM | POA: Insufficient documentation

## 2018-09-16 DIAGNOSIS — I770 Arteriovenous fistula, acquired: Secondary | ICD-10-CM | POA: Diagnosis not present

## 2018-09-16 NOTE — Progress Notes (Signed)
    Postoperative Access Visit   History of Present Illness   [facility] is a 63 y.o. year old male who presents for postoperative follow-up for: left brachiocephalic arteriovenous fistula by Dr. Trula Slade (Date: 07/02/18).  The patient is Spanish-speaking and a [employer] employed interpreter is present for today's exam.  The patient's wounds are healed.  The patient denies steal symptoms.  The patient is able to complete their activities of daily living.  He was last seen about 4 to 6 weeks ago in office and at that time dialysis duplex showed that his left arm brachiocephalic fistula was slow to mature.  He is not yet on hemodialysis.   Physical Examination   Vitals:   09/16/18 1417  BP: (!) 153/71  Pulse: 71  Resp: 20  Temp: 98 F (36.7 C)  SpO2: 98%  Weight: 151 lb (68.5 kg)  Height: 5\' 2"  (1.575 m)   Body mass index is 27.62 kg/m.  left arm Incision is healed, hand grip is 5/5, sensation in digits is intact, palpable thrill, bruit can be auscultated, faintly palpable L radial pulse     Medical Decision Making   [facility] is a 62 y.o. year old male who presents s/p left brachiocephalic arteriovenous fistula   Patent L arm brachiocephalic fistula without signs or symptoms of steal syndrome  Based on duplex, fistula has continued to mature  The patient's access will be ready for use 09/24/18  The patient may follow up on a prn basis   Ryan Ligas PA-C [facility] Office: [phone]  Clinic MD: Dr. Scot Dock

## 2019-04-16 DIAGNOSIS — R918 Other nonspecific abnormal finding of lung field: Secondary | ICD-10-CM | POA: Diagnosis not present

## 2019-04-16 DIAGNOSIS — R600 Localized edema: Secondary | ICD-10-CM | POA: Diagnosis not present

## 2019-04-16 DIAGNOSIS — N185 Chronic kidney disease, stage 5: Secondary | ICD-10-CM | POA: Diagnosis not present

## 2019-04-16 DIAGNOSIS — R9431 Abnormal electrocardiogram [ECG] [EKG]: Secondary | ICD-10-CM | POA: Diagnosis not present

## 2019-04-16 DIAGNOSIS — I1311 Hypertensive heart and chronic kidney disease without heart failure, with stage 5 chronic kidney disease, or end stage renal disease: Secondary | ICD-10-CM | POA: Diagnosis not present

## 2019-04-16 DIAGNOSIS — E1122 Type 2 diabetes mellitus with diabetic chronic kidney disease: Secondary | ICD-10-CM | POA: Diagnosis not present

## 2019-04-16 DIAGNOSIS — Z125 Encounter for screening for malignant neoplasm of prostate: Secondary | ICD-10-CM | POA: Diagnosis not present

## 2019-04-16 DIAGNOSIS — Z01818 Encounter for other preprocedural examination: Secondary | ICD-10-CM | POA: Diagnosis not present

## 2019-04-16 DIAGNOSIS — N186 End stage renal disease: Secondary | ICD-10-CM | POA: Diagnosis not present

## 2019-04-16 DIAGNOSIS — J90 Pleural effusion, not elsewhere classified: Secondary | ICD-10-CM | POA: Diagnosis not present

## 2019-04-16 DIAGNOSIS — Z7682 Awaiting organ transplant status: Secondary | ICD-10-CM | POA: Diagnosis not present

## 2019-06-03 ENCOUNTER — Encounter (HOSPITAL_COMMUNITY): Payer: Self-pay | Admitting: Emergency Medicine

## 2019-06-03 ENCOUNTER — Inpatient Hospital Stay (HOSPITAL_COMMUNITY)
Admission: EM | Admit: 2019-06-03 | Discharge: 2019-06-09 | DRG: 673 | Disposition: A | Discharge status: Home / Self Care | Payer: Medicare HMO | Attending: Family Medicine | Admitting: Family Medicine

## 2019-06-03 ENCOUNTER — Emergency Department (HOSPITAL_COMMUNITY): Payer: Medicare HMO

## 2019-06-03 ENCOUNTER — Other Ambulatory Visit: Payer: Self-pay

## 2019-06-03 DIAGNOSIS — Z79899 Other long term (current) drug therapy: Secondary | ICD-10-CM

## 2019-06-03 DIAGNOSIS — E875 Hyperkalemia: Secondary | ICD-10-CM | POA: Diagnosis not present

## 2019-06-03 DIAGNOSIS — Z992 Dependence on renal dialysis: Secondary | ICD-10-CM

## 2019-06-03 DIAGNOSIS — N4889 Other specified disorders of penis: Secondary | ICD-10-CM | POA: Diagnosis present

## 2019-06-03 DIAGNOSIS — T82898A Other specified complication of vascular prosthetic devices, implants and grafts, initial encounter: Secondary | ICD-10-CM

## 2019-06-03 DIAGNOSIS — D631 Anemia in chronic kidney disease: Secondary | ICD-10-CM | POA: Diagnosis not present

## 2019-06-03 DIAGNOSIS — Z419 Encounter for procedure for purposes other than remedying health state, unspecified: Secondary | ICD-10-CM

## 2019-06-03 DIAGNOSIS — D721 Eosinophilia, unspecified: Secondary | ICD-10-CM | POA: Diagnosis present

## 2019-06-03 DIAGNOSIS — E8770 Fluid overload, unspecified: Secondary | ICD-10-CM | POA: Diagnosis not present

## 2019-06-03 DIAGNOSIS — R339 Retention of urine, unspecified: Secondary | ICD-10-CM | POA: Diagnosis not present

## 2019-06-03 DIAGNOSIS — N185 Chronic kidney disease, stage 5: Secondary | ICD-10-CM | POA: Diagnosis not present

## 2019-06-03 DIAGNOSIS — I120 Hypertensive chronic kidney disease with stage 5 chronic kidney disease or end stage renal disease: Secondary | ICD-10-CM | POA: Diagnosis not present

## 2019-06-03 DIAGNOSIS — N186 End stage renal disease: Secondary | ICD-10-CM | POA: Diagnosis not present

## 2019-06-03 DIAGNOSIS — E872 Acidosis, unspecified: Secondary | ICD-10-CM

## 2019-06-03 DIAGNOSIS — R609 Edema, unspecified: Secondary | ICD-10-CM | POA: Diagnosis present

## 2019-06-03 DIAGNOSIS — N179 Acute kidney failure, unspecified: Secondary | ICD-10-CM | POA: Diagnosis not present

## 2019-06-03 DIAGNOSIS — R0602 Shortness of breath: Secondary | ICD-10-CM | POA: Diagnosis not present

## 2019-06-03 DIAGNOSIS — R918 Other nonspecific abnormal finding of lung field: Secondary | ICD-10-CM | POA: Diagnosis not present

## 2019-06-03 DIAGNOSIS — E1122 Type 2 diabetes mellitus with diabetic chronic kidney disease: Secondary | ICD-10-CM | POA: Diagnosis not present

## 2019-06-03 DIAGNOSIS — E039 Hypothyroidism, unspecified: Secondary | ICD-10-CM | POA: Diagnosis not present

## 2019-06-03 DIAGNOSIS — Z7682 Awaiting organ transplant status: Secondary | ICD-10-CM

## 2019-06-03 DIAGNOSIS — Z7984 Long term (current) use of oral hypoglycemic drugs: Secondary | ICD-10-CM

## 2019-06-03 DIAGNOSIS — R9389 Abnormal findings on diagnostic imaging of other specified body structures: Secondary | ICD-10-CM

## 2019-06-03 DIAGNOSIS — Z452 Encounter for adjustment and management of vascular access device: Secondary | ICD-10-CM | POA: Diagnosis not present

## 2019-06-03 DIAGNOSIS — I10 Essential (primary) hypertension: Secondary | ICD-10-CM | POA: Diagnosis not present

## 2019-06-03 DIAGNOSIS — Z20828 Contact with and (suspected) exposure to other viral communicable diseases: Secondary | ICD-10-CM | POA: Diagnosis not present

## 2019-06-03 DIAGNOSIS — R300 Dysuria: Secondary | ICD-10-CM | POA: Diagnosis not present

## 2019-06-03 HISTORY — DX: Essential (primary) hypertension: I10

## 2019-06-03 LAB — CBC WITH DIFFERENTIAL/PLATELET
Abs Immature Granulocytes: 0 10*3/uL (ref 0.00–0.07)
Basophils Absolute: 0 10*3/uL (ref 0.0–0.1)
Basophils Relative: 0 %
Eosinophils Absolute: 2.2 10*3/uL — ABNORMAL HIGH (ref 0.0–0.5)
Eosinophils Relative: 33 %
HCT: 20.7 % — ABNORMAL LOW (ref 39.0–52.0)
Hemoglobin: 6.5 g/dL — CL (ref 13.0–17.0)
Lymphocytes Relative: 16 %
Lymphs Abs: 1.1 10*3/uL (ref 0.7–4.0)
MCH: 28.9 pg (ref 26.0–34.0)
MCHC: 31.4 g/dL (ref 30.0–36.0)
MCV: 92 fL (ref 80.0–100.0)
Monocytes Absolute: 0.1 10*3/uL (ref 0.1–1.0)
Monocytes Relative: 1 %
Neutro Abs: 3.3 10*3/uL (ref 1.7–7.7)
Neutrophils Relative %: 50 %
Platelets: 155 10*3/uL (ref 150–400)
RBC: 2.25 MIL/uL — ABNORMAL LOW (ref 4.22–5.81)
RDW: 13.2 % (ref 11.5–15.5)
WBC: 6.6 10*3/uL (ref 4.0–10.5)
nRBC: 0 % (ref 0.0–0.2)
nRBC: 0 /100 WBC

## 2019-06-03 LAB — COMPREHENSIVE METABOLIC PANEL
ALT: 13 U/L (ref 0–44)
AST: 13 U/L — ABNORMAL LOW (ref 15–41)
Albumin: 3 g/dL — ABNORMAL LOW (ref 3.5–5.0)
Alkaline Phosphatase: 136 U/L — ABNORMAL HIGH (ref 38–126)
Anion gap: 13 (ref 5–15)
BUN: 128 mg/dL — ABNORMAL HIGH (ref 8–23)
CO2: 14 mmol/L — ABNORMAL LOW (ref 22–32)
Calcium: 7.8 mg/dL — ABNORMAL LOW (ref 8.9–10.3)
Chloride: 111 mmol/L (ref 98–111)
Creatinine, Ser: 12.8 mg/dL — ABNORMAL HIGH (ref 0.61–1.24)
GFR calc Af Amer: 4 mL/min — ABNORMAL LOW (ref >60)
GFR calc non Af Amer: 4 mL/min — ABNORMAL LOW (ref >60)
Glucose, Bld: 81 mg/dL (ref 70–99)
Potassium: 6.2 mmol/L — ABNORMAL HIGH (ref 3.5–5.1)
Sodium: 138 mmol/L (ref 135–145)
Total Bilirubin: 0.7 mg/dL (ref 0.3–1.2)
Total Protein: 6.2 g/dL — ABNORMAL LOW (ref 6.5–8.1)

## 2019-06-03 LAB — URINALYSIS, ROUTINE W REFLEX MICROSCOPIC
Bilirubin Urine: NEGATIVE
Glucose, UA: 50 mg/dL — AB
Ketones, ur: NEGATIVE mg/dL
Leukocytes,Ua: NEGATIVE
Nitrite: NEGATIVE
Protein, ur: >=300 mg/dL — AB
Specific Gravity, Urine: 1.009 (ref 1.005–1.030)
pH: 5 (ref 5.0–8.0)

## 2019-06-03 LAB — SARS CORONAVIRUS 2 (TAT 6-24 HRS): SARS Coronavirus 2: NEGATIVE

## 2019-06-03 LAB — POC OCCULT BLOOD, ED: Fecal Occult Bld: NEGATIVE

## 2019-06-03 LAB — LIPASE, BLOOD: Lipase: 37 U/L (ref 11–51)

## 2019-06-03 MED ORDER — FUROSEMIDE 10 MG/ML IJ SOLN
160.0000 mg | Freq: Two times a day (BID) | INTRAVENOUS | Status: DC
  Administered 2019-06-03 – 2019-06-04 (×2): 160 mg via INTRAVENOUS
  Filled 2019-06-03: qty 16
  Filled 2019-06-03 (×2): qty 2
  Filled 2019-06-03: qty 16

## 2019-06-03 MED ORDER — ACETAMINOPHEN 650 MG RE SUPP: 650.0000 mg | Freq: Four times a day (QID) | RECTAL | Status: DC | PRN

## 2019-06-03 MED ORDER — CHLORHEXIDINE GLUCONATE CLOTH 2 % EX PADS
6.0000 | MEDICATED_PAD | Freq: Every day | CUTANEOUS | Status: DC
  Administered 2019-06-04 – 2019-06-06 (×3): 6 via TOPICAL

## 2019-06-03 MED ORDER — SODIUM ZIRCONIUM CYCLOSILICATE 10 G PO PACK
10.0000 g | PACK | Freq: Once | ORAL | Status: AC
  Administered 2019-06-04: 10 g via ORAL
  Filled 2019-06-03: qty 1

## 2019-06-03 MED ORDER — ACETAMINOPHEN 325 MG PO TABS
650.0000 mg | ORAL_TABLET | Freq: Four times a day (QID) | ORAL | Status: DC | PRN
  Administered 2019-06-05 – 2019-06-06 (×2): 650 mg via ORAL
  Filled 2019-06-03 (×2): qty 2

## 2019-06-03 MED ORDER — SODIUM CHLORIDE 0.9 % IV SOLN
250.0000 mg | Freq: Every day | INTRAVENOUS | Status: AC
  Administered 2019-06-03 – 2019-06-06 (×4): 250 mg via INTRAVENOUS
  Filled 2019-06-03 (×4): qty 20

## 2019-06-03 MED ORDER — DARBEPOETIN ALFA 200 MCG/0.4ML IJ SOSY
200.0000 ug | PREFILLED_SYRINGE | INTRAMUSCULAR | Status: DC
  Administered 2019-06-04: 200 ug via INTRAVENOUS
  Filled 2019-06-03: qty 0.4

## 2019-06-03 MED ORDER — CHLORHEXIDINE GLUCONATE CLOTH 2 % EX PADS: 6.0000 | MEDICATED_PAD | Freq: Every day | CUTANEOUS | Status: DC

## 2019-06-03 MED ORDER — CALCITRIOL 0.25 MCG PO CAPS
0.2500 ug | ORAL_CAPSULE | Freq: Every day | ORAL | Status: DC
  Administered 2019-06-03 – 2019-06-09 (×6): 0.25 ug via ORAL
  Filled 2019-06-03 (×6): qty 1

## 2019-06-03 MED ORDER — VITAMIN D 25 MCG (1000 UNIT) PO TABS
2000.0000 [IU] | ORAL_TABLET | Freq: Every day | ORAL | Status: DC
  Administered 2019-06-03 – 2019-06-09 (×6): 2000 [IU] via ORAL
  Filled 2019-06-03 (×6): qty 2

## 2019-06-03 NOTE — Progress Notes (Signed)
Got called from dialysis, they were unable to access his fistula.  They feel that there may be some competing branches ??  I have called Dr. Scot Dock with [facility]- he will evaluate at some point and we will make a plan for tomorrow.  In the meantime I will treat with lasix and lokelma to temporize him overnight   [facility]

## 2019-06-03 NOTE — ED Triage Notes (Addendum)
Patient arrived from PCP via [facility] with reports of fluid retention. Bilateral lower extremities edema and difficulty urinating He said he has a new fistula but has not started dialysis. He reports he has not been able to urinate since yesterday because he is swollen all over including his penis which makes it difficult.  Patient also reports that he is on the waiting list  for a kidney Transplant at [facility]

## 2019-06-03 NOTE — H&P (Signed)
History and Physical    [facility] [id number] DOB: 1956-01-26 DOA: 06/03/2019  PCP: Ryan Hook, MD  Patient coming from: home  I have personally briefly reviewed patient's old medical records in [facility]  Chief Complaint: edema, difficulty urinating  HPI: [facility] is [facility] 63 y.o. male with medical history significant of T2DM, HTN, and CKD presenting with difficulty urinating and worsening swelling.  He reports difficulting urinating over the past few days with progressive swelling over the same period of time (about 1 week).  He notes increasing SOB with exertion and general malaise.  He's seen by Dr. Joelyn [facility] at [facility] and has been sent to [facility] for transplant evaluation and is on the transplant list.  He's undergone AV fistula creation in Dec 2019.  He c/o cramping, nausea, vomiting, itching.  He denies fevers, chills, cough, or chest pain.    Interpreter used during evaluation.  ED Course: Labs, CXR, nephrology c/s.  Hospitalist to admit.  Review of Systems: As per HPI otherwise 10 point review of systems negative.   Past Medical History:  Diagnosis Date   Diabetes mellitus without complication ([place])    Hypertension     reports that he has never smoked. He does not have any smokeless tobacco history on file. He reports that he does not drink alcohol or use drugs.  No Known Allergies  No family history on file.  Prior to Admission medications   Medication Sig Start Date End Date Taking? Authorizing Provider  amLODipine (NORVASC) 5 MG tablet Take 5 mg by mouth daily.   Yes [provider]  calcitRIOL (ROCALTROL) 0.25 MCG capsule Take 0.25 mcg by mouth daily.   Yes [provider]  cholecalciferol (VITAMIN D3) 25 MCG (1000 UT) tablet Take 2,000 Units by mouth daily.   Yes [provider]  furosemide (LASIX) 20 MG tablet Take 20 mg by mouth.   Yes [provider]    Physical Exam: Vitals:   06/03/19 1730  06/03/19 1745 06/03/19 1800 06/03/19 1815  BP: 124/68  (!) 105/59   Pulse: 69 67 65 67  Resp: 13 11 10 14   Temp:      TempSrc:      SpO2: 100% 100% 100% 100%  Weight:      Height:        Constitutional: NAD, calm, comfortable Vitals:   06/03/19 1730 06/03/19 1745 06/03/19 1800 06/03/19 1815  BP: 124/68  (!) 105/59   Pulse: 69 67 65 67  Resp: 13 11 10 14   Temp:      TempSrc:      SpO2: 100% 100% 100% 100%  Weight:      Height:       Eyes: PERRL, lids and conjunctivae normal ENMT: Mucous membranes are moist. Posterior pharynx clear of any exudate or lesions.Normal dentition.  Neck: normal, supple, no masses, no thyromegaly Respiratory: clear to auscultation bilaterally  Cardiovascular: Regular rate and rhythm, no murmurs / rubs / gallops. 2+ LE edema bilaterally. Abdomen: no tenderness, no masses palpated. No hepatosplenomegaly. Bowel sounds positive.  Musculoskeletal: no clubbing / cyanosis. No joint deformity upper and lower extremities. Good ROM, no contractures. Normal muscle tone.  Skin: no rashes, lesions, ulcers. No induration Neurologic: CN 2-12 grossly intact. Sensation intact Strength 5/5 in all 4.  Psychiatric: Normal judgment and insight. Alert and oriented x 3. Normal mood.   Labs on Admission: I have personally reviewed following labs and imaging studies  CBC: Recent Labs  Lab 06/03/19 1230  WBC 6.6  NEUTROABS 3.3  HGB 6.5*  HCT 20.7*  MCV 92.0  PLT 99991111   Basic Metabolic Panel: Recent Labs  Lab 06/03/19 1230  NA 138  K 6.2*  CL 111  CO2 14*  GLUCOSE 81  BUN 128*  CREATININE 12.80*  CALCIUM 7.8*   GFR: Estimated Creatinine Clearance: 4.5 mL/min ([facility]) (by C-G formula based on SCr of 12.8 mg/dL (H)). Liver Function Tests: Recent Labs  Lab 06/03/19 1230  AST 13*  ALT 13  ALKPHOS 136*  BILITOT 0.7  PROT 6.2*  ALBUMIN 3.0*   Recent Labs  Lab 06/03/19 1230  LIPASE 37   No results for input(s): AMMONIA in the last 168 hours. Coagulation  Profile: No results for input(s): INR, PROTIME in the last 168 hours. Cardiac Enzymes: No results for input(s): CKTOTAL, CKMB, CKMBINDEX, TROPONINI in the last 168 hours. BNP (last 3 results) No results for input(s): PROBNP in the last 8760 hours. HbA1C: No results for input(s): HGBA1C in the last 72 hours. CBG: No results for input(s): GLUCAP in the last 168 hours. Lipid Profile: No results for input(s): CHOL, HDL, LDLCALC, TRIG, CHOLHDL, LDLDIRECT in the last 72 hours. Thyroid Function Tests: No results for input(s): TSH, T4TOTAL, FREET4, T3FREE, THYROIDAB in the last 72 hours. Anemia Panel: No results for input(s): VITAMINB12, FOLATE, FERRITIN, TIBC, IRON, RETICCTPCT in the last 72 hours. Urine analysis:    Component Value Date/Time   COLORURINE YELLOW 06/03/2019 [facility] 06/03/2019 1354   LABSPEC 1.009 06/03/2019 1354   PHURINE 5.0 06/03/2019 1354   GLUCOSEU 50 ([facility]) 06/03/2019 1354   HGBUR SMALL ([facility]) 06/03/2019 1354   BILIRUBINUR NEGATIVE 06/03/2019 1354   KETONESUR NEGATIVE 06/03/2019 1354   PROTEINUR >=300 ([facility]) 06/03/2019 1354   NITRITE NEGATIVE 06/03/2019 1354   LEUKOCYTESUR NEGATIVE 06/03/2019 1354    Radiological Exams on Admission: Dg Chest 2 View  Result Date: 06/03/2019 CLINICAL DATA:  Shortness of breath EXAM: CHEST - 2 VIEW COMPARISON:  November 27, 2013 FINDINGS: There is focal airspace opacity in the right middle lobe. Lungs elsewhere clear. Heart is enlarged with pulmonary vascularity normal. No adenopathy. There is aortic atherosclerosis. There is postoperative change in the lower cervical region. IMPRESSION: Focal airspace opacity in the right middle lobe, [facility] finding felt to represent pneumonia. Lungs elsewhere clear. There is cardiomegaly. Pulmonary vascularity normal. Aortic Atherosclerosis (ICD10-I70.0). Followup PA and lateral chest radiographs recommended in 3-4 weeks following trial of antibiotic therapy to ensure resolution and exclude  underlying malignancy. Electronically Signed   By: [facility] M.D.   On: 06/03/2019 13:13    EKG: Independently reviewed. 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 Jo[facility] at [facility].  On list for transplant.  Had AV fistula creation in Dec 2019 in preparation for dialysis. Appreciate renal assistance - high dose lasix, plan for dialysis (unfornutately unable to access fistula in dialysis, plan for lasix/lokelma and vascular evaluation) Fistulogram planned per vascular I/O, daily weights  Focal Airspace Opacity in R middle Lobe: pt denies SOB, fevers, chills, cough.  Will repeat CXR in AM, low suspicion for pneumonia.  Hold off on abx.  Negative COVID 19 testing.  Anemia: likely 2/2 CKD.  Hemodynamically stable with negative hemoccult.  Attempt to hold off on transfusion as pt on transplant list.  Plan for iron and ESA per renal.  Iron, b12, folate, ferritin  T2DM: etiology of CKD thought 2/2 T2DM, but pt without diabetes meds on med list and BG on BMP is normal. Follow A1c  Hyperkalemia: follow with lokelma and lasix.  Plan for dialysis.  Hypertension: hold amlodipine due to significant anemia.  Lasix per renal.  DVT prophylaxis: SCD's Code Status: full  Family Communication: none at bedside Disposition Plan: pending improvement Consults called: renal, vascular  Admission status: inpatient   Fayrene Helper MD [facility] Pager AMION  If 7PM-7AM, please contact night-coverage [email or web address] Password [facility]  06/03/2019, 6:58 PM

## 2019-06-03 NOTE — H&P (View-Only) (Signed)
Patient name: [facility] MRN: [id number] DOB: 05-21-1956 Sex: male  REASON FOR CONSULT:   Poorly functioning left forearm AV fistula.  Consult is requested by Dr. Moshe Cipro.  HPI:   [facility] is a pleasant 63 y.o. male with a left forearm AV fistula.  I am unable to find his operative report.  He does not speak [place].  He believes it was placed in [facility] but again I cannot find any operative report in Epic.  This was his first dialysis treatment today in the fistula was not functioning according to the dialysis nurses.  Current Facility-Administered Medications  Medication Dose Route Frequency Provider Last Rate Last Dose  . [START ON 06/04/2019] Chlorhexidine Gluconate Cloth 2 % PADS 6 each  6 each Topical Q0600 Corliss Parish, MD      . Derrill Memo ON 06/04/2019] Chlorhexidine Gluconate Cloth 2 % PADS 6 each  6 each Topical Q0600 Corliss Parish, MD      . Derrill Memo ON 06/04/2019] Darbepoetin Alfa (ARANESP) injection 200 mcg  200 mcg Intravenous Q Fri-HD Corliss Parish, MD      . ferric gluconate (NULECIT) 250 mg in sodium chloride 0.9 % 100 mL IVPB  250 mg Intravenous Daily Corliss Parish, MD      . furosemide (LASIX) 160 mg in dextrose 5 % 50 mL IVPB  160 mg Intravenous Q12H Corliss Parish, MD      . sodium zirconium cyclosilicate (LOKELMA) packet 10 g  10 g Oral Once Corliss Parish, MD       Current Outpatient Medications  Medication Sig Dispense Refill  . amLODipine (NORVASC) 5 MG tablet Take 5 mg by mouth daily.    . calcitRIOL (ROCALTROL) 0.25 MCG capsule Take 0.25 mcg by mouth daily.    . cholecalciferol (VITAMIN D3) 25 MCG (1000 UT) tablet Take 2,000 Units by mouth daily.    . furosemide (LASIX) 20 MG tablet Take 20 mg by mouth.      REVIEW OF SYSTEMS: Unable to obtain as there was no translator present                                       PHYSICAL EXAM:   Vitals:   06/03/19 1130 06/03/19 1200 06/03/19  1400 06/03/19 1722  BP: (!) 123/57 129/60 128/63 (!) 126/59  Pulse: 70 71 69 70  Resp: 14 13 12 16   Temp:    97.7 F (36.5 C)  TempSrc:    Oral  SpO2: 100% 100% 100% 100%  Weight:      Height:        GENERAL: The patient is a well-nourished male, in no acute distress. The vital signs are documented above. CARDIOVASCULAR: There is a regular rate and rhythm. PULMONARY: There is good air exchange bilaterally without wheezing or rales. He has a good thrill in his left forearm AV fistula.  The fistula is not especially pulsatile He has a palpable left radial pulse.  DATA:   Potassium is 6.2.  MEDICAL ISSUES:   END-STAGE RENAL DISEASE: This patient was to begin dialysis today.  He has hyperkalemia.  He is being admitted by the medical service.  I recommended a fistulogram to further evaluate his fistula.  If we cannot salvage the fistula he may require placement of a temporary dialysis catheter tomorrow in the operating room.  I have scheduled his fistulogram for tomorrow first thing  in the morning.  [name] [facility] [place] [phone]

## 2019-06-03 NOTE — ED Notes (Signed)
Transported to dialysis.

## 2019-06-03 NOTE — Consult Note (Signed)
[facility] Renal Consultation Note  Requesting MD: ER Indication for Consultation: ESRD   HPI:  Joanthan Bada is a 63 y.o. male with longstanding diabetes mellitus and hypertension as well as CKD followed by Dr. Joelyn Oms at [facility]  He has had progressive CKD with a creatinine over 5 for quite some time.  He has been sent to [facility] for transplant evaluation and he is on the list.  He underwent an AV fistula creation in December 2019 in preparation for dialysis.  Last time he was at [facility] was in January 2020.  I suspect like a follow-up after that has been Covid related.  He reports for about a week, he has had worsening swelling of lower extremities and up to his genitals.  He also reports nausea, vomiting, muscle cramping and itching.  He has had difficulty passing urine only because he is so swollen.  He presented to the emergency department today with these concerns.  Labs showed creatinine of 12.8, BUN of 128, potassium 6.2, bicarb 14, hemoglobin 6.5 and albumin of 3.0.  We are asked to assist in management.  His fistula looks mature and ready to use.  I talked to him through the translator telling him that he needs to start dialysis.  He is agreeable to this  Creatinine, Ser  Date/Time Value Ref Range Status  06/03/2019 12:30 PM 12.80 (H) 0.61 - 1.24 mg/dL Final     PMHx:   Past Medical History:  Diagnosis Date  . Diabetes mellitus without complication ([place])   . Hypertension       Family Hx: No family history on file.  Social History:  reports that he has never smoked. He does not have any smokeless tobacco history on file. He reports that he does not drink alcohol or use drugs.  Allergies: No Known Allergies  Medications: Prior to Admission medications   Not on File    I have reviewed the patient's current medications.  Labs:  Results for orders placed or performed during the hospital encounter of 06/03/19 (from  the past 48 hour(s))  Comprehensive metabolic panel     Status: Abnormal   Collection Time: 06/03/19 12:30 PM  Result Value Ref Range   Sodium 138 135 - 145 mmol/L   Potassium 6.2 (H) 3.5 - 5.1 mmol/L   Chloride 111 98 - 111 mmol/L   CO2 14 (L) 22 - 32 mmol/L   Glucose, Bld 81 70 - 99 mg/dL   BUN 128 (H) 8 - 23 mg/dL   Creatinine, Ser 12.80 (H) 0.61 - 1.24 mg/dL   Calcium 7.8 (L) 8.9 - 10.3 mg/dL   Total Protein 6.2 (L) 6.5 - 8.1 g/dL   Albumin 3.0 (L) 3.5 - 5.0 g/dL   AST 13 (L) 15 - 41 U/L   ALT 13 0 - 44 U/L   Alkaline Phosphatase 136 (H) 38 - 126 U/L   Total Bilirubin 0.7 0.3 - 1.2 mg/dL   GFR calc non Af Amer 4 (L) >60 mL/min   GFR calc Af Amer 4 (L) >60 mL/min   Anion gap 13 5 - 15    Comment: Performed at [facility], [address], [place], [place] [address]  Lipase, blood     Status: None   Collection Time: 06/03/19 12:30 PM  Result Value Ref Range   Lipase 37 11 - 51 U/L    Comment: Performed at [place],  [place] [address]  CBC with Differential     Status: Abnormal   Collection Time: 06/03/19 12:30 PM  Result Value Ref Range   WBC 6.6 4.0 - 10.5 K/uL   RBC 2.25 (L) 4.22 - 5.81 MIL/uL   Hemoglobin 6.5 (LL) 13.0 - 17.0 g/dL    Comment: REPEATED TO VERIFY THIS CRITICAL RESULT HAS VERIFIED AND BEEN CALLED TO GREG NIKOLICH,RN BY ZELDA BEECH ON 10 22 2020 AT [id number], AND HAS BEEN READ BACK.     HCT 20.7 (L) 39.0 - 52.0 %   MCV 92.0 80.0 - 100.0 fL   MCH 28.9 26.0 - 34.0 pg   MCHC 31.4 30.0 - 36.0 g/dL   RDW 13.2 11.5 - 15.5 %   Platelets 155 150 - 400 K/uL   nRBC 0.0 0.0 - 0.2 %   Neutrophils Relative % 50 %   Neutro Abs 3.3 1.7 - 7.7 K/uL   Lymphocytes Relative 16 %   Lymphs Abs 1.1 0.7 - 4.0 K/uL   Monocytes Relative 1 %   Monocytes Absolute 0.1 0.1 - 1.0 K/uL   Eosinophils Relative 33 %   Eosinophils Absolute 2.2 (H) 0.0 - 0.5 K/uL   Basophils Relative 0 %   Basophils Absolute 0.0 0.0 - 0.1 K/uL   nRBC 0 0 /100 WBC    Abs Immature Granulocytes 0.00 0.00 - 0.07 K/uL    Comment: Performed at [facility] [address] [place], [place] [address]  Urinalysis, Routine w reflex microscopic     Status: Abnormal   Collection Time: 06/03/19  1:54 PM  Result Value Ref Range   Color, Urine YELLOW YELLOW   APPearance CLEAR CLEAR   Specific Gravity, Urine 1.009 1.005 - 1.030   pH 5.0 5.0 - 8.0   Glucose, UA 50 (A) NEGATIVE mg/dL   Hgb urine dipstick SMALL (A) NEGATIVE   Bilirubin Urine NEGATIVE NEGATIVE   Ketones, ur NEGATIVE NEGATIVE mg/dL   Protein, ur >=300 (A) NEGATIVE mg/dL   Nitrite NEGATIVE NEGATIVE   Leukocytes,Ua NEGATIVE NEGATIVE   RBC / HPF 0-5 0 - 5 RBC/hpf   WBC, UA 0-5 0 - 5 WBC/hpf   Bacteria, UA RARE (A) NONE SEEN   Squamous Epithelial / LPF 0-5 0 - 5   Mucus PRESENT    Amorphous Crystal PRESENT     Comment: Performed at [facility] [address], [place], [place] [address]     ROS:  A comprehensive review of systems was negative except for: Constitutional: positive for fatigue Cardiovascular: positive for lower extremity edema Gastrointestinal: positive for nausea and vomiting Genitourinary: positive for decreased stream Musculoskeletal: positive for Muscle cramps Itching  Physical Exam: Vitals:   06/03/19 1200 06/03/19 1400  BP: 129/60 128/63  Pulse: 71 69  Resp: 13 12  Temp:    SpO2: 100% 100%     General: Hispanic male, small in stature.  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  He  is now ESRD and it is time to begin dialysis 1.Renal- advanced CKD and not to the point of requiring dialysis.  He is agreeable to start.  We can do his first treatment this afternoon.  His bladder scan did show retained urine, but I and O cath only returned 300-mild but probably not clinically significant bladder outlet obstruction secondary to edema.  Will not impact my plan to initiate dialysis.  Have talked to the renal navigator to get him placed in an OP unit as well  2. Hypertension/volume  -patient with massive volume overload.  Will attempt some UF with dialysis today and continue to pull volume as able.  We will also given IV Lasix as an inpatient to alleviate some of the swelling 3.  Hyperkalemia-due to advanced CKD.  Should correct with dialysis 4.  Metabolic acidosis-also due to advanced CKD.  Should correct with dialysis 5. Anemia  -this is also likely due to CKD.  Would like to hold off on transfusion since patient is listed for transplant-transfusion can worsen his antibody status.  We will utilize iron and ESA to help him build his hemoglobin back up 6.  Bones- will check calc, phos and intact PTH and act as necessary    Louis Meckel 06/03/2019, 2:58 PM

## 2019-06-03 NOTE — ED Notes (Signed)
Patient in Xray at this time.

## 2019-06-03 NOTE — ED Provider Notes (Signed)
[facility] EMERGENCY DEPARTMENT Provider Note   CSN: [id number] Arrival date & time: 06/03/19  1045     History   Chief Complaint Chief Complaint  Patient presents with   Fluid Retention   Dysuria    HPI Ryan Wells is a 63 y.o. male with past medical history significant for type 2 diabetes on metformin and hypertension presents to emergency room today with chief complaint of fluid retention x 5 days.  Patient states he is having difficulty urinating and has only been able to urinate small amount yesterday.  He admits to his testicles and penis being swollen.  He also has worsening bilateral upper and lower extremity edema.  He is reporting associated dyspnea on exertion and he is barely able to walk. He takes lasix PO 20mg  daily and he reports compliance. He was seen at pcp office prior to arrival and sent here for further evaluation.   Pt states he recently had fistula placed in left arm and is supposed to start dialysis but has nothing scheduled yet. Pt is on transplant list for kidney at [facility]. His nephrologist is Dr. Joelyn Oms at [facility]  He denies fever, chills, cough, congestion, abdominal pain, chest pain, diarrhea.    Due to language barrier, a video interpreter was present during the history-taking and subsequent discussion (and for part of the physical exam) with this patient.    Past Medical History:  Diagnosis Date   Diabetes mellitus without complication ([place])    Hypertension     Patient Active Problem List   Diagnosis Date Noted   Volume overload 06/03/2019    Home Medications    Prior to Admission medications   Medication Sig Start Date End Date Taking? Authorizing Provider  amLODipine (NORVASC) 5 MG tablet Take 5 mg by mouth daily.   Yes [provider]  calcitRIOL (ROCALTROL) 0.25 MCG capsule Take 0.25 mcg by mouth daily.   Yes [provider]  cholecalciferol (VITAMIN D3) 25 MCG (1000  UT) tablet Take 2,000 Units by mouth daily.   Yes [provider]  furosemide (LASIX) 20 MG tablet Take 20 mg by mouth.   Yes [provider]    Family History No family history on file.  Social History Social History   Tobacco Use   Smoking status: Never Smoker  Substance Use Topics   Alcohol use: Never    Frequency: Never   Drug use: Never     Allergies   Patient has no known allergies.   Review of Systems Review of Systems  Constitutional: Negative for chills and fever.  HENT: Negative for congestion, facial swelling, sinus pain and sore throat.   Respiratory: Positive for shortness of breath. Negative for cough and wheezing.   Cardiovascular: Positive for leg swelling. Negative for chest pain.  Gastrointestinal: Negative for abdominal pain, diarrhea, nausea and vomiting.  Genitourinary: Positive for difficulty urinating, penile pain, penile swelling, scrotal swelling and urgency. Negative for dysuria, frequency and hematuria.  Musculoskeletal: Positive for arthralgias and joint swelling.  Skin: Negative for rash and wound.  Allergic/Immunologic: Positive for immunocompromised state (diabetic).  Neurological: Positive for weakness (generalized). Negative for dizziness, numbness and headaches.     Physical Exam Updated Vital Signs BP (!) 123/58    Pulse 71    Temp 98.2 F (36.8 C) (Oral)    Resp 12    Ht 5\' 2"  (1.575 m)    Wt 60.8 kg    SpO2 100%  BMI 24.51 kg/m   Physical Exam Vitals signs and nursing note reviewed.  Constitutional:      General: He is not in acute distress.    Appearance: He is not ill-appearing.  HENT:     Head: Normocephalic and atraumatic.     Right Ear: Tympanic membrane and external ear normal.     Left Ear: Tympanic membrane and external ear normal.     Nose: Nose normal.     Mouth/Throat:     Mouth: Mucous membranes are moist.     Pharynx: Oropharynx is clear.  Eyes:     General: No scleral icterus.        Right eye: No discharge.        Left eye: No discharge.     Extraocular Movements: Extraocular movements intact.     Conjunctiva/sclera: Conjunctivae normal.     Pupils: Pupils are equal, round, and reactive to light.  Neck:     Musculoskeletal: Normal range of motion.     Vascular: No JVD.  Cardiovascular:     Rate and Rhythm: Normal rate and regular rhythm.     Pulses:          Radial pulses are 2+ on the right side and 2+ on the left side.     Heart sounds: Normal heart sounds.     Comments: Bilateral lower extremity 3+ pitting edema extending to scrotum and penis  Unable to assess DP pulses due to pedal edema.  Foot is warm to the touch with cap refill less than 2 seconds. Pulmonary:     Comments: Rales heard in bilateral lower fields. Symmetric chest rise. No wheezing. SpO2 is 100% on room air. Abdominal:     Comments: Abdomen is soft, non-distended, and non-tender in all quadrants. No rigidity, no guarding. No peritoneal signs.  Genitourinary:    Comments: Forensic psychologist present for exam. Digital Rectal Exam reveals sphincter with good tone. No external hemorrhoids. No masses or fissures. Stool color is brown with no overt blood. No gross melena.  Musculoskeletal: Normal range of motion.     Right lower leg: 3+ Edema present.     Left lower leg: 3+ Edema present.  Skin:    General: Skin is warm and dry.     Capillary Refill: Capillary refill takes less than 2 seconds.     Comments: Fistula in left arm with no signs of infection  Neurological:     Mental Status: He is oriented to person, place, and time.     GCS: GCS eye subscore is 4. GCS verbal subscore is 5. GCS motor subscore is 6.     Comments: Fluent speech, no facial droop.  Psychiatric:        Behavior: Behavior normal.       ED Treatments / Results  Labs (all labs ordered are listed, but only abnormal results are displayed) Labs Reviewed  COMPREHENSIVE METABOLIC PANEL - Abnormal; Notable for the  following components:      Result Value   Potassium 6.2 (*)    CO2 14 (*)    BUN 128 (*)    Creatinine, Ser 12.80 (*)    Calcium 7.8 (*)    Total Protein 6.2 (*)    Albumin 3.0 (*)    AST 13 (*)    Alkaline Phosphatase 136 (*)    GFR calc non Af Amer 4 (*)    GFR calc Af Amer 4 (*)    All other components within normal  limits  CBC WITH DIFFERENTIAL/PLATELET - Abnormal; Notable for the following components:   RBC 2.25 (*)    Hemoglobin 6.5 (*)    HCT 20.7 (*)    Eosinophils Absolute 2.2 (*)    All other components within normal limits  URINALYSIS, ROUTINE W REFLEX MICROSCOPIC - Abnormal; Notable for the following components:   Glucose, UA 50 (*)    Hgb urine dipstick SMALL (*)    Protein, ur >=300 (*)    Bacteria, UA RARE (*)    All other components within normal limits  SARS CORONAVIRUS 2 (TAT 6-24 HRS)  LIPASE, BLOOD  POC OCCULT BLOOD, ED    EKG EKG Interpretation  Date/Time:  Thursday June 03 2019 10:59:22 EDT Ventricular Rate:  74 PR Interval:    QRS Duration: 111 QT Interval:  412 QTC Calculation: 458 R Axis:   81 Text Interpretation:  Sinus rhythm Borderline right axis deviation Borderline low voltage, extremity leads Confirmed by Davonna Belling [phone]) on 06/03/2019 1:42:13 PM   Radiology Dg Chest 2 View  Result Date: 06/03/2019 CLINICAL DATA:  Shortness of breath EXAM: CHEST - 2 VIEW COMPARISON:  November 27, 2013 FINDINGS: There is focal airspace opacity in the right middle lobe. Lungs elsewhere clear. Heart is enlarged with pulmonary vascularity normal. No adenopathy. There is aortic atherosclerosis. There is postoperative change in the lower cervical region. IMPRESSION: Focal airspace opacity in the right middle lobe, a finding felt to represent pneumonia. Lungs elsewhere clear. There is cardiomegaly. Pulmonary vascularity normal. Aortic Atherosclerosis (ICD10-I70.0). Followup PA and lateral chest radiographs recommended in 3-4 weeks following trial of  antibiotic therapy to ensure resolution and exclude underlying malignancy. Electronically Signed   By: Lowella Grip III M.D.   On: 06/03/2019 13:13    Procedures .Critical Care Performed by: Cherre Robins, PA-C Authorized by: Cherre Robins, PA-C   Critical care provider statement:    Critical care time (minutes):  37   Critical care time was exclusive of:  Separately billable procedures and treating other patients   Critical care was time spent personally by me on the following activities:  Development of treatment plan with patient or surrogate, discussions with consultants, evaluation of patient's response to treatment, examination of patient, obtaining history from patient or surrogate, ordering and performing treatments and interventions, ordering and review of laboratory studies, ordering and review of radiographic studies, pulse oximetry, re-evaluation of patient's condition and review of old charts   I assumed direction of critical care for this patient from another provider in my specialty: no     (including critical care time)  Medications Ordered in ED Medications  Chlorhexidine Gluconate Cloth 2 % PADS 6 each (has no administration in time range)  ferric gluconate (NULECIT) 250 mg in sodium chloride 0.9 % 100 mL IVPB (has no administration in time range)  Darbepoetin Alfa (ARANESP) injection 200 mcg (has no administration in time range)  furosemide (LASIX) 160 mg in dextrose 5 % 50 mL IVPB (has no administration in time range)  Chlorhexidine Gluconate Cloth 2 % PADS 6 each (has no administration in time range)     Initial Impression / Assessment and Plan / ED Course  I have reviewed the triage vital signs and the nursing notes.  Pertinent labs & imaging results that were available during my care of the patient were reviewed by me and considered in my medical decision making (see chart for details).  Patient seen and examined. Patient nontoxic appearing, in  no distress but  appears uncomfortable. He is febrile, normotensive. On exam rales heard in bilateral lower fields. He has normal work of breathing, no respiratory distress. He has bilateral 3+ pitting edema from feet extending to scrotum and penis. Abdomen is nontender.   Labs are significant for hemoglobin of 6.5, chart review shows 11 months ago hemoglobin was 9.2. Type and screen pending. CMP with hyperkalemia 6.2, worsening renal function BUN/Creatinine 128/12.80. The most recent creatinine I can see is 3.49 x 11 months ago. EKG without ischemic changes.  Fecal occult negative.  No gross melena on exam. Pt had L arm brachiocephalic fistula placed that will be ready for use 09/24/18 per vascular surgery notes. Chest xray viewed by me shows possible infiltrate in right middle lobe suggestive of pneumonia. Pt denies fevers at home and cough.  Case discussed with nephrology Dr. Moshe Cipro who will arrange emergent dialysis. She also recommends holding off on transfusion as pt is on kidney transplant list at [facility]. Plan to continue to monitor CBC. The patient was discussed with  Dr. Alvino Chapel who agrees with the treatment plan. Covid test is pending.  Spoke with Dr. Florene Glen with hospitalist service who agrees to assume care of patient and bring into the hospital for further evaluation and management.    Discussed plan to hold off on antibiotics as patient does not have infectious symptoms.  They will likely repeat chest x-ray tomorrow.  Portions of this note were generated with [employer]. Dictation errors may occur despite best attempts at proofreading.   Final Clinical Impressions(s) / ED Diagnoses   Final diagnoses:  Hyperkalemia  Metabolic acidosis  Anemia due to chronic kidney disease, unspecified CKD stage    ED Discharge Orders    None       Flint Melter 06/03/19 1612    Davonna Belling, MD 06/03/19 [phone]

## 2019-06-03 NOTE — ED Notes (Signed)
Lab called critical hemoglobin 6.5 provider notified of results.

## 2019-06-03 NOTE — ED Notes (Signed)
Patient returned from xray.

## 2019-06-03 NOTE — ED Notes (Signed)
Per Dialysis, patient's fistula was not able to be accessed(Nephrologist aware) Plan is to do dialysis tomorrow. PT will be returned to ED-yellow 38 to hold for a bed

## 2019-06-03 NOTE — Consult Note (Signed)
Patient name: Ryan Wells MRN: [id number] DOB: 06-Sep-1955 Sex: male  REASON FOR CONSULT:   Poorly functioning left forearm AV fistula.  Consult is requested by Dr. Moshe Cipro.  HPI:   Ryan Wells is a pleasant 63 y.o. male with a left forearm AV fistula.  I am unable to find his operative report.  He does not speak [place].  He believes it was placed in [facility] but again I cannot find any operative report in Epic.  This was his first dialysis treatment today in the fistula was not functioning according to the dialysis nurses.  Current Facility-Administered Medications  Medication Dose Route Frequency Provider Last Rate Last Dose  . [START ON 06/04/2019] Chlorhexidine Gluconate Cloth 2 % PADS 6 each  6 each Topical Q0600 Corliss Parish, MD      . Derrill Memo ON 06/04/2019] Chlorhexidine Gluconate Cloth 2 % PADS 6 each  6 each Topical Q0600 Corliss Parish, MD      . Derrill Memo ON 06/04/2019] Darbepoetin Alfa (ARANESP) injection 200 mcg  200 mcg Intravenous Q Fri-HD Corliss Parish, MD      . ferric gluconate (NULECIT) 250 mg in sodium chloride 0.9 % 100 mL IVPB  250 mg Intravenous Daily Corliss Parish, MD      . furosemide (LASIX) 160 mg in dextrose 5 % 50 mL IVPB  160 mg Intravenous Q12H Corliss Parish, MD      . sodium zirconium cyclosilicate (LOKELMA) packet 10 g  10 g Oral Once Corliss Parish, MD       Current Outpatient Medications  Medication Sig Dispense Refill  . amLODipine (NORVASC) 5 MG tablet Take 5 mg by mouth daily.    . calcitRIOL (ROCALTROL) 0.25 MCG capsule Take 0.25 mcg by mouth daily.    . cholecalciferol (VITAMIN D3) 25 MCG (1000 UT) tablet Take 2,000 Units by mouth daily.    . furosemide (LASIX) 20 MG tablet Take 20 mg by mouth.      REVIEW OF SYSTEMS: Unable to obtain as there was no translator present                                       PHYSICAL EXAM:   Vitals:   06/03/19 1130 06/03/19 1200 06/03/19  1400 06/03/19 1722  BP: (!) 123/57 129/60 128/63 (!) 126/59  Pulse: 70 71 69 70  Resp: 14 13 12 16   Temp:    97.7 F (36.5 C)  TempSrc:    Oral  SpO2: 100% 100% 100% 100%  Weight:      Height:        GENERAL: The patient is a well-nourished male, in no acute distress. The vital signs are documented above. CARDIOVASCULAR: There is a regular rate and rhythm. PULMONARY: There is good air exchange bilaterally without wheezing or rales. He has a good thrill in his left forearm AV fistula.  The fistula is not especially pulsatile He has a palpable left radial pulse.  DATA:   Potassium is 6.2.  MEDICAL ISSUES:   END-STAGE RENAL DISEASE: This patient was to begin dialysis today.  He has hyperkalemia.  He is being admitted by the medical service.  I recommended a fistulogram to further evaluate his fistula.  If we cannot salvage the fistula he may require placement of a temporary dialysis catheter tomorrow in the operating room.  I have scheduled his fistulogram for tomorrow first thing  in the morning.  [name] [facility] [phone]

## 2019-06-04 ENCOUNTER — Encounter (HOSPITAL_COMMUNITY)
Admission: EM | Disposition: A | Discharge status: Home / Self Care | Payer: Self-pay | Source: Home / Self Care | Attending: Family Medicine

## 2019-06-04 ENCOUNTER — Inpatient Hospital Stay (HOSPITAL_COMMUNITY): Payer: Medicare HMO

## 2019-06-04 ENCOUNTER — Encounter (HOSPITAL_COMMUNITY): Payer: Self-pay | Admitting: Vascular Surgery

## 2019-06-04 DIAGNOSIS — E872 Acidosis: Secondary | ICD-10-CM | POA: Diagnosis not present

## 2019-06-04 DIAGNOSIS — T82898A Other specified complication of vascular prosthetic devices, implants and grafts, initial encounter: Secondary | ICD-10-CM | POA: Diagnosis not present

## 2019-06-04 DIAGNOSIS — E8770 Fluid overload, unspecified: Secondary | ICD-10-CM | POA: Diagnosis not present

## 2019-06-04 DIAGNOSIS — N186 End stage renal disease: Secondary | ICD-10-CM | POA: Diagnosis not present

## 2019-06-04 DIAGNOSIS — E875 Hyperkalemia: Secondary | ICD-10-CM | POA: Diagnosis not present

## 2019-06-04 DIAGNOSIS — Z992 Dependence on renal dialysis: Secondary | ICD-10-CM | POA: Diagnosis not present

## 2019-06-04 DIAGNOSIS — N179 Acute kidney failure, unspecified: Secondary | ICD-10-CM | POA: Diagnosis not present

## 2019-06-04 DIAGNOSIS — I10 Essential (primary) hypertension: Secondary | ICD-10-CM

## 2019-06-04 HISTORY — PX: A/V FISTULAGRAM: CATH118298

## 2019-06-04 HISTORY — PX: EMBOLIZATION: CATH118239

## 2019-06-04 HISTORY — PX: PERIPHERAL VASCULAR BALLOON ANGIOPLASTY: CATH118281

## 2019-06-04 LAB — COMPREHENSIVE METABOLIC PANEL
ALT: 11 U/L (ref 0–44)
AST: 11 U/L — ABNORMAL LOW (ref 15–41)
Albumin: 2.8 g/dL — ABNORMAL LOW (ref 3.5–5.0)
Alkaline Phosphatase: 124 U/L (ref 38–126)
Anion gap: 14 (ref 5–15)
BUN: 129 mg/dL — ABNORMAL HIGH (ref 8–23)
CO2: 13 mmol/L — ABNORMAL LOW (ref 22–32)
Calcium: 7.9 mg/dL — ABNORMAL LOW (ref 8.9–10.3)
Chloride: 111 mmol/L (ref 98–111)
Creatinine, Ser: 13.14 mg/dL — ABNORMAL HIGH (ref 0.61–1.24)
GFR calc Af Amer: 4 mL/min — ABNORMAL LOW (ref >60)
GFR calc non Af Amer: 4 mL/min — ABNORMAL LOW (ref >60)
Glucose, Bld: 64 mg/dL — ABNORMAL LOW (ref 70–99)
Potassium: 6.6 mmol/L (ref 3.5–5.1)
Sodium: 138 mmol/L (ref 135–145)
Total Bilirubin: 0.5 mg/dL (ref 0.3–1.2)
Total Protein: 5.7 g/dL — ABNORMAL LOW (ref 6.5–8.1)

## 2019-06-04 LAB — VITAMIN B12: Vitamin B-12: 258 pg/mL (ref 180–914)

## 2019-06-04 LAB — HEMOGLOBIN A1C
Hgb A1c MFr Bld: 5.5 % (ref 4.8–5.6)
Mean Plasma Glucose: 111.15 mg/dL

## 2019-06-04 LAB — CBC
HCT: 18.5 % — ABNORMAL LOW (ref 39.0–52.0)
Hemoglobin: 5.8 g/dL — CL (ref 13.0–17.0)
MCH: 28.9 pg (ref 26.0–34.0)
MCHC: 31.4 g/dL (ref 30.0–36.0)
MCV: 92 fL (ref 80.0–100.0)
Platelets: 142 10*3/uL — ABNORMAL LOW (ref 150–400)
RBC: 2.01 MIL/uL — ABNORMAL LOW (ref 4.22–5.81)
RDW: 13.4 % (ref 11.5–15.5)
WBC: 5.6 10*3/uL (ref 4.0–10.5)
nRBC: 0 % (ref 0.0–0.2)

## 2019-06-04 LAB — IRON AND TIBC
Iron: 150 ug/dL (ref 45–182)
Saturation Ratios: 92 % — ABNORMAL HIGH (ref 17.9–39.5)
TIBC: 164 ug/dL — ABNORMAL LOW (ref 250–450)
UIBC: 14 ug/dL

## 2019-06-04 LAB — PATHOLOGIST SMEAR REVIEW

## 2019-06-04 LAB — SURGICAL PCR SCREEN
MRSA, PCR: NEGATIVE
Staphylococcus aureus: POSITIVE — AB

## 2019-06-04 LAB — HEPATITIS B SURFACE ANTIBODY,QUALITATIVE: Hep B S Ab: NONREACTIVE

## 2019-06-04 LAB — ABO/RH: ABO/RH(D): O POS

## 2019-06-04 LAB — PHOSPHORUS: Phosphorus: 9 mg/dL — ABNORMAL HIGH (ref 2.5–4.6)

## 2019-06-04 LAB — FERRITIN: Ferritin: 214 ng/mL (ref 24–336)

## 2019-06-04 LAB — HEPATITIS B CORE ANTIBODY, TOTAL: Hep B Core Total Ab: NONREACTIVE

## 2019-06-04 LAB — HIV ANTIBODY (ROUTINE TESTING W REFLEX): HIV Screen 4th Generation wRfx: NONREACTIVE

## 2019-06-04 LAB — FOLATE: Folate: 12.5 ng/mL (ref >5.9)

## 2019-06-04 LAB — HEPATITIS B SURFACE ANTIGEN: Hepatitis B Surface Ag: NONREACTIVE

## 2019-06-04 SURGERY — A/V FISTULAGRAM
Anesthesia: LOCAL | Laterality: Left

## 2019-06-04 MED ORDER — HEPARIN SODIUM (PORCINE) 1000 UNIT/ML IJ SOLN
INTRAMUSCULAR | Status: AC
  Filled 2019-06-04: qty 1

## 2019-06-04 MED ORDER — HEPARIN SODIUM (PORCINE) 1000 UNIT/ML IJ SOLN
INTRAMUSCULAR | Status: DC | PRN
  Administered 2019-06-04: 3000 [IU] via INTRAVENOUS

## 2019-06-04 MED ORDER — PENTAFLUOROPROP-TETRAFLUOROETH EX AERO: 1.0000 "application " | INHALATION_SPRAY | CUTANEOUS | Status: DC | PRN

## 2019-06-04 MED ORDER — HEPARIN (PORCINE) IN NACL 1000-0.9 UT/500ML-% IV SOLN
INTRAVENOUS | Status: DC | PRN
  Administered 2019-06-04: 500 mL

## 2019-06-04 MED ORDER — HEPARIN SODIUM (PORCINE) 1000 UNIT/ML DIALYSIS: 20.0000 [IU]/kg | INTRAMUSCULAR | Status: DC | PRN

## 2019-06-04 MED ORDER — DARBEPOETIN ALFA 200 MCG/0.4ML IJ SOSY
PREFILLED_SYRINGE | INTRAMUSCULAR | Status: AC
  Filled 2019-06-04: qty 0.4

## 2019-06-04 MED ORDER — LIDOCAINE HCL (PF) 1 % IJ SOLN: 5.0000 mL | INTRAMUSCULAR | Status: DC | PRN

## 2019-06-04 MED ORDER — LIDOCAINE-PRILOCAINE 2.5-2.5 % EX CREA: 1.0000 "application " | TOPICAL_CREAM | CUTANEOUS | Status: DC | PRN

## 2019-06-04 MED ORDER — SODIUM CHLORIDE 0.9% IV SOLUTION: Freq: Once | INTRAVENOUS | Status: DC

## 2019-06-04 MED ORDER — SODIUM CHLORIDE 0.9 % IV SOLN: 100.0000 mL | INTRAVENOUS | Status: DC | PRN

## 2019-06-04 MED ORDER — HEPARIN (PORCINE) IN NACL 1000-0.9 UT/500ML-% IV SOLN
INTRAVENOUS | Status: AC
  Filled 2019-06-04: qty 500

## 2019-06-04 MED ORDER — LIDOCAINE HCL (PF) 1 % IJ SOLN
INTRAMUSCULAR | Status: AC
  Filled 2019-06-04: qty 30

## 2019-06-04 MED ORDER — INSULIN ASPART 100 UNIT/ML IV SOLN: 10.0000 [IU] | Freq: Once | INTRAVENOUS | Status: DC

## 2019-06-04 MED ORDER — ALTEPLASE 2 MG IJ SOLR: 2.0000 mg | Freq: Once | INTRAMUSCULAR | Status: DC | PRN

## 2019-06-04 MED ORDER — SODIUM BICARBONATE-DEXTROSE 150-5 MEQ/L-% IV SOLN
150.0000 meq | INTRAVENOUS | Status: DC
  Administered 2019-06-04: 150 meq via INTRAVENOUS
  Filled 2019-06-04: qty 1000

## 2019-06-04 MED ORDER — IODIXANOL 320 MG/ML IV SOLN
INTRAVENOUS | Status: DC | PRN
  Administered 2019-06-04: 60 mL

## 2019-06-04 MED ORDER — HEPARIN SODIUM (PORCINE) 1000 UNIT/ML DIALYSIS: 1000.0000 [IU] | INTRAMUSCULAR | Status: DC | PRN

## 2019-06-04 MED ORDER — DEXTROSE 50 % IV SOLN: 25.0000 g | Freq: Once | INTRAVENOUS | Status: DC

## 2019-06-04 MED ORDER — LIDOCAINE HCL (PF) 1 % IJ SOLN
INTRAMUSCULAR | Status: DC | PRN
  Administered 2019-06-04: 2 mL

## 2019-06-04 SURGICAL SUPPLY — 20 items
BAG SNAP BAND KOVER 36X36 (MISCELLANEOUS) ×2 IMPLANT
BALLN MUSTANG 5.0X40 75 (BALLOONS) ×2
BALLN MUSTANG 6.0X40 75 (BALLOONS) ×2
BALLN MUSTANG 7.0X20 75 (BALLOONS) ×2
BALLOON MUSTANG 5.0X40 75 (BALLOONS) IMPLANT
BALLOON MUSTANG 6.0X40 75 (BALLOONS) IMPLANT
BALLOON MUSTANG 7.0X20 75 (BALLOONS) IMPLANT
CATH BEACON 5 .035 65 KMP TIP (CATHETERS) ×1 IMPLANT
COIL NESTER 14X12 (Embolic) ×2 IMPLANT
COVER DOME SNAP 22 D (MISCELLANEOUS) ×2 IMPLANT
KIT ENCORE 26 ADVANTAGE (KITS) ×2 IMPLANT
KIT MICROPUNCTURE NIT STIFF (SHEATH) ×1 IMPLANT
PROTECTION STATION PRESSURIZED (MISCELLANEOUS) ×2
SHEATH PINNACLE R/O II 5F 6CM (SHEATH) ×1 IMPLANT
SHEATH PROBE COVER 6X72 (BAG) ×2 IMPLANT
STATION PROTECTION PRESSURIZED (MISCELLANEOUS) ×1 IMPLANT
STOPCOCK MORSE 400PSI 3WAY (MISCELLANEOUS) ×2 IMPLANT
TRAY PV CATH (CUSTOM PROCEDURE TRAY) ×2 IMPLANT
TUBING CIL FLEX 10 FLL-RA (TUBING) ×2 IMPLANT
WIRE BENTSON .035X145CM (WIRE) ×1 IMPLANT

## 2019-06-04 NOTE — Procedures (Signed)
I evaluated the patient while on hemodialysis.  I reviewed the patient's treatment plan.  Patient tolerating dialysis well.  Discussed with RN.  Fistula started out well on HD with good flows.  Reduced flow to 25mL/min now.  Will monitor closely.  ?vasospasm.  Will plan HD again tomorrow.

## 2019-06-04 NOTE — Progress Notes (Signed)
PROGRESS NOTE    Ryan Wells  [id number] DOB: 12-29-1955 DOA: 06/03/2019 PCP: Ryan Hook, MD      Brief Narrative:  [name] is a 63 y.o. 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 Joelyn Wells who presented with progressive SOB with exertion, malaise, decreased UOP and now swelling over the last few days to weeks.  In the ER, Cr 12.8, K 6.2, Hgb 6.5 and CXR with focal opacity, no effusions.  Nephrology were consulted and recommended initiating dialysis.       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  Found to have progression of his renal failure, now acidotic, overloaded and hyperkalemic.    Unfortunately, fistula could not be accessed last night. Fistulogram this morning and then HD performed today 400cc urine output with 160 mg furosemide twice -Consult Nephrology, appreciate cares -Stop furosemide   Hyperkalemia K up overnight depsite Lasix.  Lokelma not given by nursing.  ECG this AM prersonally reviewed, shows NSR, normal T waves.  Lokelma given this morning. Now is s/p HD first session -Rpeat RFP daily  Metabolic acidosis Mild -Repeat RFP tomorrow  Anemia of chronic renal disease Iron stores replete.  FOBT negative.  Asymptomatic from anemia this morning.  I agree with conservative transfusion management, as long as patient has no dyspnea, chest pain or confusion.  Smear unremarkable -Aranesp -Avoid transfusion given renal transplant hopes  Metabolic bone disease -Vit D analogs/supplement per Nephrology  Diabetes Chart history.  Not on meds, and A1c is 5%.  Hypertension BP controlled -Hold amlodipine  Eosinophilia  Incidentally noted on smear.  Significance unclear.         MDM and disposition: The below labs and imaging reports were reviewed and summarized above.  Medication management as above.  The  patient was admitted with acute on chronic renal failure, acidosis and hyperkalemia  He is now s/p first HD session.  Consult to Neph for HD, and SW for CLIP.        DVT prophylaxis: SCDs Code Status: FULL Family Communication: Son by phone    Consultants:   Nephrology  Vascular surgery  Procedures:   10/23 angioplasty of AV fistula and coil embo of competing branch vessel by Dr. Scot Wells  10/23 first HD session        Subjective: All history through videophonic interpreter. Mild pain with palptation of abdomen.  No confusion.  No chest pain, no dyspnea. Stil swollen.  No vomiting.    Objective: Vitals:   06/04/19 1230 06/04/19 1300 06/04/19 1330 06/04/19 1341  BP: (!) 107/59 127/64 133/66 136/65  Pulse: 67 66 68 68  Resp:    11  Temp:    97.6 F (36.4 C)  TempSrc:    Oral  SpO2:    99%  Weight:    79.5 kg  Height:        Intake/Output Summary (Last 24 hours) at 06/04/2019 1545 Last data filed at 06/04/2019 1446 Gross per 24 hour  Intake 301.81 ml  Output 2225 ml  Net -1923.19 ml   Filed Weights   06/03/19 1430 06/04/19 1105 06/04/19 1341  Weight: 60.8 kg 81.3 kg 79.5 kg    Examination: General appearance:  adult male, alert and in no acute distress.   HEENT: Anicteric, conjunctiva pink, lids and lashes normal. No nasal deformity, discharge, epistaxis.  Lips moist, edentulous, OP Moist no oral lesions hearing normal.   Skin: Warm and dry.  NO jaundice.  No suspicious rashes or lesions. Cardiac: RRR, nl S1-S2, no murmurs appreciated.  Capillary refill is brisk.  No JVD.  Brawny change and 2+ LE edema.  Radia  pulses 2+ and symmetric. Respiratory: Normal respiratory rate and rhythm.  CTAB without rales or wheezes. Abdomen: Abdomen soft.  Mild nonfocal  TTP without guarding. No ascites, distension, hepatosplenomegaly.   MSK: No deformities or effusions. Neuro: Awake and alert.  EOMI, moves all extremities. Speech fluent.    Psych: Sensorium intact and  responding to questions, attention normal. Affect normal.  Judgment and insight appear normal.    Data Reviewed: I have personally reviewed following labs and imaging studies:  CBC: Recent Labs  Lab 06/03/19 1230 06/04/19 0559  WBC 6.6 5.6  NEUTROABS 3.3  --   HGB 6.5* 5.8*  HCT 20.7* 18.5*  MCV 92.0 92.0  PLT 155 [id number]*   Basic Metabolic Panel: Recent Labs  Lab 06/03/19 1230 06/04/19 0559  NA 138 138  K 6.2* 6.6*  CL 111 111  CO2 14* 13*  GLUCOSE 81 64*  BUN 128* 129*  CREATININE 12.80* 13.14*  CALCIUM 7.8* 7.9*  PHOS  --  9.0*   GFR: Estimated Creatinine Clearance: 5.2 mL/min (A) (by C-G formula based on SCr of 13.14 mg/dL (H)). Liver Function Tests: Recent Labs  Lab 06/03/19 1230 06/04/19 0559  AST 13* 11*  ALT 13 11  ALKPHOS 136* 124  BILITOT 0.7 0.5  PROT 6.2* 5.7*  ALBUMIN 3.0* 2.8*   Recent Labs  Lab 06/03/19 1230  LIPASE 37   No results for input(s): AMMONIA in the last 168 hours. Coagulation Profile: No results for input(s): INR, PROTIME in the last 168 hours. Cardiac Enzymes: No results for input(s): CKTOTAL, CKMB, CKMBINDEX, TROPONINI in the last 168 hours. BNP (last 3 results) No results for input(s): PROBNP in the last 8760 hours. HbA1C: Recent Labs    06/04/19 0559  HGBA1C 5.5   CBG: No results for input(s): GLUCAP in the last 168 hours. Lipid Profile: No results for input(s): CHOL, HDL, LDLCALC, TRIG, CHOLHDL, LDLDIRECT in the last 72 hours. Thyroid Function Tests: No results for input(s): TSH, T4TOTAL, FREET4, T3FREE, THYROIDAB in the last 72 hours. Anemia Panel: Recent Labs    06/04/19 0559  VITAMINB12 258  FOLATE 12.5  FERRITIN 214  TIBC 164*  IRON 150   Urine analysis:    Component Value Date/Time   COLORURINE YELLOW 06/03/2019 [place] 06/03/2019 1354   LABSPEC 1.009 06/03/2019 1354   PHURINE 5.0 06/03/2019 1354   GLUCOSEU 50 (A) 06/03/2019 1354   HGBUR SMALL (A) 06/03/2019 1354   BILIRUBINUR  NEGATIVE 06/03/2019 1354   [place] 06/03/2019 1354   PROTEINUR >=300 (A) 06/03/2019 1354   NITRITE NEGATIVE 06/03/2019 1354   LEUKOCYTESUR NEGATIVE 06/03/2019 1354   Sepsis Labs: @LABRCNTIP (procalcitonin:4,lacticacidven:4)  ) Recent Results (from the past 240 hour(s))  SARS CORONAVIRUS 2 (TAT 6-24 HRS) Nasopharyngeal Nasopharyngeal Swab     Status: None   Collection Time: 06/03/19  1:32 PM   Specimen: Nasopharyngeal Swab  Result Value Ref Range Status   SARS Coronavirus 2 NEGATIVE NEGATIVE Final    Comment: (NOTE) SARS-CoV-2 target nucleic acids are NOT DETECTED. The SARS-CoV-2 RNA is generally detectable in upper and lower respiratory specimens during the acute phase of infection. Negative results do not preclude SARS-CoV-2 infection, do not rule out co-infections  with other pathogens, and should not be used as the sole basis for treatment or other patient management decisions. Negative results must be combined with clinical observations, patient history, and epidemiological information. The expected result is Negative. Fact Sheet for Patients: SugarRoll.be Fact Sheet for Healthcare Providers: https://www.woods-mathews.com/ This test is not yet approved or cleared by the [place] FDA and  has been authorized for detection and/or diagnosis of SARS-CoV-2 by FDA under an Emergency Use Authorization (EUA). This EUA will remain  in effect (meaning this test can be used) for the duration of the COVID-19 declaration under Section 56 4(b)(1) of the Act, 21 U.S.C. section 360bbb-3(b)(1), unless the authorization is terminated or revoked sooner. Performed at [facility], [place] [address], [place], [place] [address]   Surgical pcr screen     Status: Abnormal   Collection Time: 06/04/19  7:32 AM   Specimen: Nasal Mucosa; Nasal Swab  Result Value Ref Range Status   MRSA, PCR NEGATIVE NEGATIVE Final   Staphylococcus aureus  POSITIVE (A) NEGATIVE Final    Comment: (NOTE) The Xpert SA Assay (FDA approved for NASAL specimens in patients 47 years of age and older), is one component of a comprehensive surveillance program. It is not intended to diagnose infection nor to guide or monitor treatment. Performed at [facility], [place] [address], [place], [place] [address]          Radiology Studies: Dg Chest 2 View  Result Date: 06/04/2019 CLINICAL DATA:  Follow-up right mid lung masslike density EXAM: CHEST - 2 VIEW COMPARISON:  06/03/2019 FINDINGS: Cardiac shadow is enlarged but stable. Rounded density is again identified in the right mid lung with associated mild right pleural thickening. On the lateral projection this projects in the midportion of the right lung along the major fissure likely representing loculated fluid. Noncontrast CT would be confirmatory. The left lung remains clear. No acute bony abnormality is noted. Postsurgical changes in the cervical spine are seen. IMPRESSION: Stable masslike density in the right mid lung which appears to represent loculated fluid within the major fissure. Noncontrast CT would be confirmatory in nature. Electronically Signed   By: Inez Catalina M.D.   On: 06/04/2019 07:44   Dg Chest 2 View  Result Date: 06/03/2019 CLINICAL DATA:  Shortness of breath EXAM: CHEST - 2 VIEW COMPARISON:  November 27, 2013 FINDINGS: There is focal airspace opacity in the right middle lobe. Lungs elsewhere clear. Heart is enlarged with pulmonary vascularity normal. No adenopathy. There is aortic atherosclerosis. There is postoperative change in the lower cervical region. IMPRESSION: Focal airspace opacity in the right middle lobe, a finding felt to represent pneumonia. Lungs elsewhere clear. There is cardiomegaly. Pulmonary vascularity normal. Aortic Atherosclerosis (ICD10-I70.0). Followup PA and lateral chest radiographs recommended in 3-4 weeks following trial of antibiotic therapy to ensure  resolution and exclude underlying malignancy. Electronically Signed   By: Lowella Grip III M.D.   On: 06/03/2019 13:13        Scheduled Meds: . sodium chloride   Intravenous Once  . calcitRIOL  0.25 mcg Oral Daily  . Chlorhexidine Gluconate Cloth  6 each Topical Q0600  . Chlorhexidine Gluconate Cloth  6 each Topical Q0600  . cholecalciferol  2,000 Units Oral Daily  . Darbepoetin Alfa      . darbepoetin (ARANESP) injection - DIALYSIS  200 mcg Intravenous Q Fri-HD  . dextrose  25 g Intravenous Once  . insulin aspart  10 Units Intravenous Once   Continuous  Infusions: . ferric gluconate (FERRLECIT/NULECIT) IV Stopped (06/04/19 1410)     LOS: 1 day    Time spent: 35 minutes    Edwin Dada, MD [facility] 06/04/2019, 3:45 PM     Please page through [place]:  [email or web address] Password [email or web address] If 7PM-7AM, please contact night-coverage

## 2019-06-04 NOTE — Progress Notes (Addendum)
CRITICAL VALUE ALERT  Critical Value: Potassium 6.6 mmmol/L                         Hemoglobin 5.8 g/dL  Date & Time Notied: 06/04/19 & 7:15am  Provider Notified: MD C. Danford  Orders Received/Actions taken: Yes   LOkelma given and EKG obtained, attached to patient's chart.

## 2019-06-04 NOTE — Care Plan (Signed)
Called by nursing re: Hgb down to 5.8, K up to 6.6.    Patient asymptomatic from Hgb, I agree with Nephrology's reasoning to avoid transfusion. They gave IV iron yesterday, plan Aranesp today.   Continue conservative mgmt.  Re: hyperkalemia: Lokelma never given. Lasix 160 mg twice produced only 450cc urine. I instructed nursing to give [facility] now, obtain EKG.  We are awaiting dialysis, delayed due to access issues.

## 2019-06-04 NOTE — Op Note (Signed)
   PATIENT: Ryan Wells      MRN: [id number] DOB: 08/02/1956    DATE OF PROCEDURE: 06/04/2019  INDICATIONS:    [name] is a 63 y.o. male with ESRD.  He had an AV fistula placed in the left arm elsewhere.  They tried to dialyze him yesterday but this was unsuccessful therefore he was set up for a fistulogram  PROCEDURE:    1.  Ultrasound-guided access to the left brachiocephalic fistula 2.  Fistulogram left brachiocephalic fistula 3.  Balloon angioplasty of 2 stenoses in left brachiocephalic AV fistula 4.  Coil embolization of a large competing branch using 2 12 x 14 Nester coils  SURGEON: Judeth Cornfield. Scot Dock, MD, FACS  ANESTHESIA: Local  EBL: Minimal  TECHNIQUE: The patient was brought to the peripheral vascular lab.  The left arm was prepped and draped in usual sterile fashion.  Under ultrasound guidance, after the skin was anesthetized, I cannulated the proximal fistula with a micropuncture needle and a micropuncture sheath was introduced over the wire.  Fistulogram was obtained to evaluate the fistula from the point of cannulation to include the central veins.  There were 2 stenoses which were identified.  I elected to address these with balloon angioplasty.  The micropuncture sheath was exchanged for a short 5 French sheath over the wire.  The more central stenosis was addressed with a 5 mm x 4 cm balloon, subsequently a 6 mm x 4 cm balloon, and then finally a 7 mm x 2 cm balloon.  This was the most resilient stenosis and there was some residual stenosis at the end of approximately 30% but there was significant improvement.  The distal stenosis was right at a takeoff of a large competing branch.  I address this again with a 5 mm x 4 cm balloon, followed by a 6 mm x 4 cm balloon, followed by the 7 mm x 2 cm balloon.  He had an excellent result here.  Next I elected to address the large competing branch.  The Kumpe catheter was used to engage the orifice of the  branch and then using the wire which was extended into the branch the catheter was advanced into the branch.  I then placed initially a 12 x 16 Nester coil into the branch.  I confirmed position again by injecting contrast and then placed a second Nester coil which was also 12 x 16.  At the completion there was a good thrill in the fistula.  Completion film did show improvement in the 2 areas of stenosis.  The branch was not completely occluded at this point but I suspect that will occlude.  There was not enough room to place a second coil without risking getting into the fistula itself.  A 4-0 Monocryl suture was placed around the catheter site and the catheter was removed the suture was then tied and there was good hemostasis.    FINDINGS:   1.  No central venous stenosis 2.  Patent left brachiocephalic fistula with 2 areas of stenoses which were successfully ballooned as described above 3.  Coil embolization of one large competing branch.   [name], MD, FACS [facility]  DATE OF DICTATION:   06/04/2019

## 2019-06-04 NOTE — Progress Notes (Signed)
CRITICAL VALUE ALERT  Critical Value: HBG 5.8g/dL  Date & Time Notied:  06/04/19 & 6:50am  Provider Notified: NP [place]  Orders Received/Actions taken:

## 2019-06-04 NOTE — Progress Notes (Signed)
[facility]    NEPHROLOGY PROGRESS NOTE  SUBJECTIVE: Patient seen and examined in dialysis.  Patient denies chest pain, shortness of breath, nausea or vomiting.  Patient with left upper extremity edema and difficulty accessing fistula yesterday.  Underwent fistulogram with angioplasty this morning.  OBJECTIVE:  Vitals:   06/04/19 1330 06/04/19 1341  BP: 133/66 136/65  Pulse: 68 68  Resp:  11  Temp:  97.6 F (36.4 C)  SpO2:  99%    Intake/Output Summary (Last 24 hours) at 06/04/2019 1720 Last data filed at 06/04/2019 1446 Gross per 24 hour  Intake 301.81 ml  Output 2225 ml  Net -1923.19 ml      General:  AAOx3 NAD HEENT: MMM [place] AT anicteric sclera Neck:  No JVD, no adenopathy CV:  Heart RRR  Lungs:  L/S CTA bilaterally Abd:  abd SNT/ND with normal BS GU:  Bladder non-palpable Extremities: +2 left upper extremity edema. Skin:  No skin rash  MEDICATIONS:  . sodium chloride   Intravenous Once  . calcitRIOL  0.25 mcg Oral Daily  . Chlorhexidine Gluconate Cloth  6 each Topical Q0600  . Chlorhexidine Gluconate Cloth  6 each Topical Q0600  . cholecalciferol  2,000 Units Oral Daily  . Darbepoetin Alfa      . darbepoetin (ARANESP) injection - DIALYSIS  200 mcg Intravenous Q Fri-HD  . dextrose  25 g Intravenous Once  . insulin aspart  10 Units Intravenous Once       LABS:   CBC Latest Ref Rng & Units 06/04/2019 06/03/2019  WBC 4.0 - 10.5 K/uL 5.6 6.6  Hemoglobin 13.0 - 17.0 g/dL 5.8(LL) 6.5(LL)  Hematocrit 39.0 - 52.0 % 18.5(L) 20.7(L)  Platelets 150 - 400 K/uL 142(L) 155    CMP Latest Ref Rng & Units 06/04/2019 06/03/2019  Glucose 70 - 99 mg/dL 64(L) 81  BUN 8 - 23 mg/dL 129(H) 128(H)  Creatinine 0.61 - 1.24 mg/dL 13.14(H) 12.80(H)  Sodium 135 - 145 mmol/L 138 138  Potassium 3.5 - 5.1 mmol/L 6.6(HH) 6.2(H)  Chloride 98 - 111 mmol/L 111 111  CO2 22 - 32 mmol/L 13(L) 14(L)  Calcium 8.9 - 10.3 mg/dL 7.9(L) 7.8(L)  Total Protein 6.5 - 8.1 g/dL  5.7(L) 6.2(L)  Total Bilirubin 0.3 - 1.2 mg/dL 0.5 0.7  Alkaline Phos 38 - 126 U/L 124 136(H)  AST 15 - 41 U/L 11(L) 13(L)  ALT 0 - 44 U/L 11 13    Lab Results  Component Value Date   CALCIUM 7.9 (L) 06/04/2019   PHOS 9.0 (H) 06/04/2019       Component Value Date/Time   COLORURINE YELLOW 06/03/2019 [place] 06/03/2019 1354   LABSPEC 1.009 06/03/2019 1354   PHURINE 5.0 06/03/2019 1354   GLUCOSEU 50 (A) 06/03/2019 1354   HGBUR SMALL (A) 06/03/2019 1354   BILIRUBINUR NEGATIVE 06/03/2019 [place] 06/03/2019 1354   PROTEINUR >=300 (A) 06/03/2019 1354   NITRITE NEGATIVE 06/03/2019 1354   LEUKOCYTESUR NEGATIVE 06/03/2019 1354   No results found for: PHART, PCO2ART, PO2ART, HCO3, TCO2, ACIDBASEDEF, O2SAT     Component Value Date/Time   IRON 150 06/04/2019 0559   TIBC 164 (L) 06/04/2019 0559   FERRITIN 214 06/04/2019 0559   IRONPCTSAT 92 (H) 06/04/2019 0559       ASSESSMENT/PLAN:    Ryan Wells is a 63 y.o. male with longstanding diabetes mellitus and hypertension as well as CKD followed by Dr. [facility] at [facility].  He has had progressive CKD with a creatinine over 5 for quite some time.  1.  End-stage renal disease.  Initiated dialysis this admission.  Status post dialysis today.  We will plan second treatment tomorrow.  Will need clip for outpatient dialysis.  2.  Hypertension.  Stable.  3.  Anemia.  If hemoglobin drops further, would transfuse.  Will hopefully improve with ultrafiltration on dialysis.  On Aranesp and ferric gluconate.  4.  Vascular access.  Status post angioplasty today of left upper extremity AV fistula.  5.  Hyperkalemia.  Status post HD today.    [place], DO, [place]

## 2019-06-04 NOTE — Interval H&P Note (Signed)
History and Physical Interval Note:  06/04/2019 8:49 AM  Ryan Wells  has presented today for surgery, with the diagnosis of fistula inaccessability.  The various methods of treatment have been discussed with the patient and family. After consideration of risks, benefits and other options for treatment, the patient has consented to  Procedure(s): A/V FISTULAGRAM (Left) as a surgical intervention.  The patient's history has been reviewed, patient examined, no change in status, stable for surgery.  I have reviewed the patient's chart and labs.  Questions were answered to the patient's satisfaction.     [name]

## 2019-06-05 DIAGNOSIS — N179 Acute kidney failure, unspecified: Secondary | ICD-10-CM | POA: Diagnosis not present

## 2019-06-05 DIAGNOSIS — R300 Dysuria: Secondary | ICD-10-CM

## 2019-06-05 DIAGNOSIS — E875 Hyperkalemia: Secondary | ICD-10-CM | POA: Diagnosis not present

## 2019-06-05 DIAGNOSIS — I10 Essential (primary) hypertension: Secondary | ICD-10-CM | POA: Diagnosis not present

## 2019-06-05 LAB — CBC
HCT: 18.4 % — ABNORMAL LOW (ref 39.0–52.0)
HCT: 17.9 % — ABNORMAL LOW (ref 39.0–52.0)
Hemoglobin: 6 g/dL — CL (ref 13.0–17.0)
Hemoglobin: 5.8 g/dL — CL (ref 13.0–17.0)
MCH: 28.8 pg (ref 26.0–34.0)
MCH: 28.7 pg (ref 26.0–34.0)
MCHC: 32.6 g/dL (ref 30.0–36.0)
MCHC: 32.4 g/dL (ref 30.0–36.0)
MCV: 88.5 fL (ref 80.0–100.0)
MCV: 88.6 fL (ref 80.0–100.0)
Platelets: 144 10*3/uL — ABNORMAL LOW (ref 150–400)
Platelets: 132 10*3/uL — ABNORMAL LOW (ref 150–400)
RBC: 2.08 MIL/uL — ABNORMAL LOW (ref 4.22–5.81)
RBC: 2.02 MIL/uL — ABNORMAL LOW (ref 4.22–5.81)
RDW: 13.5 % (ref 11.5–15.5)
RDW: 13.5 % (ref 11.5–15.5)
WBC: 5.7 10*3/uL (ref 4.0–10.5)
WBC: 6.5 10*3/uL (ref 4.0–10.5)
nRBC: 0 % (ref 0.0–0.2)
nRBC: 0 % (ref 0.0–0.2)

## 2019-06-05 LAB — BASIC METABOLIC PANEL
Anion gap: 15 (ref 5–15)
BUN: 78 mg/dL — ABNORMAL HIGH (ref 8–23)
CO2: 18 mmol/L — ABNORMAL LOW (ref 22–32)
Calcium: 8.1 mg/dL — ABNORMAL LOW (ref 8.9–10.3)
Chloride: 106 mmol/L (ref 98–111)
Creatinine, Ser: 8.68 mg/dL — ABNORMAL HIGH (ref 0.61–1.24)
GFR calc Af Amer: 7 mL/min — ABNORMAL LOW (ref >60)
GFR calc non Af Amer: 6 mL/min — ABNORMAL LOW (ref >60)
Glucose, Bld: 120 mg/dL — ABNORMAL HIGH (ref 70–99)
Potassium: 4.7 mmol/L (ref 3.5–5.1)
Sodium: 139 mmol/L (ref 135–145)

## 2019-06-05 LAB — RENAL FUNCTION PANEL
Albumin: 2.6 g/dL — ABNORMAL LOW (ref 3.5–5.0)
Anion gap: 13 (ref 5–15)
BUN: 84 mg/dL — ABNORMAL HIGH (ref 8–23)
CO2: 20 mmol/L — ABNORMAL LOW (ref 22–32)
Calcium: 7.9 mg/dL — ABNORMAL LOW (ref 8.9–10.3)
Chloride: 107 mmol/L (ref 98–111)
Creatinine, Ser: 9.36 mg/dL — ABNORMAL HIGH (ref 0.61–1.24)
GFR calc Af Amer: 6 mL/min — ABNORMAL LOW (ref >60)
GFR calc non Af Amer: 5 mL/min — ABNORMAL LOW (ref >60)
Glucose, Bld: 95 mg/dL (ref 70–99)
Phosphorus: 6.9 mg/dL — ABNORMAL HIGH (ref 2.5–4.6)
Potassium: 4.7 mmol/L (ref 3.5–5.1)
Sodium: 140 mmol/L (ref 135–145)

## 2019-06-05 LAB — PARATHYROID HORMONE, INTACT (NO CA): PTH: 114 pg/mL — ABNORMAL HIGH (ref 15–65)

## 2019-06-05 MED ORDER — SODIUM CHLORIDE 0.9 % IV SOLN: 100.0000 mL | INTRAVENOUS | Status: DC | PRN

## 2019-06-05 MED ORDER — LIDOCAINE-PRILOCAINE 2.5-2.5 % EX CREA: 1.0000 "application " | TOPICAL_CREAM | CUTANEOUS | Status: DC | PRN

## 2019-06-05 MED ORDER — HEPARIN SODIUM (PORCINE) 1000 UNIT/ML DIALYSIS: 1000.0000 [IU] | INTRAMUSCULAR | Status: DC | PRN

## 2019-06-05 MED ORDER — PENTAFLUOROPROP-TETRAFLUOROETH EX AERO: 1.0000 "application " | INHALATION_SPRAY | CUTANEOUS | Status: DC | PRN

## 2019-06-05 MED ORDER — ALTEPLASE 2 MG IJ SOLR: 2.0000 mg | Freq: Once | INTRAMUSCULAR | Status: DC | PRN

## 2019-06-05 MED ORDER — LIDOCAINE HCL (PF) 1 % IJ SOLN: 5.0000 mL | INTRAMUSCULAR | Status: DC | PRN

## 2019-06-05 NOTE — Progress Notes (Signed)
[facility]    NEPHROLOGY PROGRESS NOTE  SUBJECTIVE: Patient seen and examined earlier today.  Patient denies chest pain, shortness of breath, nausea or vomiting.  Patient with left upper extremity edema and difficulty accessing fistula on Thursday.  Underwent fistulogram with angioplasty yesterday.  Difficulty with fistula on dialysis again today, with clotting off of 2 systems.  OBJECTIVE:  Vitals:   06/05/19 0501 06/05/19 0900  BP: (!) 138/57 130/62  Pulse: 67 67  Resp: 15 18  Temp: 98.6 F (37 C) 98 F (36.7 C)  SpO2: 96% 96%    Intake/Output Summary (Last 24 hours) at 06/05/2019 1539 Last data filed at 06/05/2019 1300 Gross per 24 hour  Intake 600 ml  Output 500 ml  Net 100 ml      General:  AAOx3 NAD HEENT: MMM [place] AT anicteric sclera Neck:  No JVD, no adenopathy CV:  Heart RRR  Lungs:  L/S CTA bilaterally Abd:  abd SNT/ND with normal BS GU:  Bladder non-palpable Extremities: +2 left upper extremity edema. Skin:  No skin rash  MEDICATIONS:  . sodium chloride   Intravenous Once  . calcitRIOL  0.25 mcg Oral Daily  . Chlorhexidine Gluconate Cloth  6 each Topical Q0600  . cholecalciferol  2,000 Units Oral Daily  . darbepoetin (ARANESP) injection - DIALYSIS  200 mcg Intravenous Q Fri-HD       LABS:   CBC Latest Ref Rng & Units 06/05/2019 06/04/2019 06/03/2019  WBC 4.0 - 10.5 K/uL 5.7 5.6 6.6  Hemoglobin 13.0 - 17.0 g/dL 6.0(LL) 5.8(LL) 6.5(LL)  Hematocrit 39.0 - 52.0 % 18.4(L) 18.5(L) 20.7(L)  Platelets 150 - 400 K/uL 144(L) 142(L) 155    CMP Latest Ref Rng & Units 06/05/2019 06/04/2019 06/04/2019  Glucose 70 - 99 mg/dL 95 120(H) 64(L)  BUN 8 - 23 mg/dL 84(H) 78(H) 129(H)  Creatinine 0.61 - 1.24 mg/dL 9.36(H) 8.68(H) 13.14(H)  Sodium 135 - 145 mmol/L 140 139 138  Potassium 3.5 - 5.1 mmol/L 4.7 4.7 6.6(HH)  Chloride 98 - 111 mmol/L 107 106 111  CO2 22 - 32 mmol/L 20(L) 18(L) 13(L)  Calcium 8.9 - 10.3 mg/dL 7.9(L) 8.1(L) 7.9(L)  Total  Protein 6.5 - 8.1 g/dL - - 5.7(L)  Total Bilirubin 0.3 - 1.2 mg/dL - - 0.5  Alkaline Phos 38 - 126 U/L - - 124  AST 15 - 41 U/L - - 11(L)  ALT 0 - 44 U/L - - 11    Lab Results  Component Value Date   PTH 114 (H) 06/04/2019   CALCIUM 7.9 (L) 06/05/2019   PHOS 6.9 (H) 06/05/2019       Component Value Date/Time   COLORURINE YELLOW 06/03/2019 [place] 06/03/2019 1354   LABSPEC 1.009 06/03/2019 1354   PHURINE 5.0 06/03/2019 1354   GLUCOSEU 50 (A) 06/03/2019 1354   HGBUR SMALL (A) 06/03/2019 1354   BILIRUBINUR NEGATIVE 06/03/2019 [place] 06/03/2019 1354   PROTEINUR >=300 (A) 06/03/2019 1354   NITRITE NEGATIVE 06/03/2019 1354   LEUKOCYTESUR NEGATIVE 06/03/2019 1354   No results found for: PHART, PCO2ART, PO2ART, HCO3, TCO2, ACIDBASEDEF, O2SAT     Component Value Date/Time   IRON 150 06/04/2019 0559   TIBC 164 (L) 06/04/2019 0559   FERRITIN 214 06/04/2019 0559   IRONPCTSAT 92 (H) 06/04/2019 0559       ASSESSMENT/PLAN:    Ryan Wells is a 63 y.o. male with longstanding diabetes mellitus and hypertension as well as CKD  followed by Dr. Joelyn Oms at [facility].  He has had progressive CKD with a creatinine over 5 for quite some time.  1.  End-stage renal disease.  Initiated dialysis this admission.  Status post dialysis today with minimal treatment time.  We will plan second treatment for Monday.  Will need clip for outpatient dialysis.  2.  Hypertension.  Stable.  3.  Anemia.  If hemoglobin drops further, would transfuse.  Will hopefully improve with ultrafiltration on dialysis.  On Aranesp and ferric gluconate.  Repeat CBC now with clotting off of 2 systems on HD  4.  Vascular access.  Status post angioplasty today of left upper extremity AV fistula.  Still with difficulty accessing.  Will ask vascular to evaluate.  5.  Hyperkalemia.  Improved status post HD yesterday   [employer], DO, FACP

## 2019-06-05 NOTE — Progress Notes (Signed)
PROGRESS NOTE    Ryan Wells  [id number] DOB: [birth year] DOA: 06/03/2019 PCP: Mack Hook, MD      Brief Narrative:  Ryan Wells is a 64 y.o. 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 Joelyn Oms who presented with progressive SOB with exertion, malaise, decreased UOP and now swelling over the last few days to weeks.  In the ER, Cr 12.8, K 6.2, Hgb 6.5 and CXR with focal opacity, no effusions.  Nephrology were consulted and recommended initiating dialysis.       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  Found to have progression of his renal failure, now acidotic, overloaded and hyperkalemic.    Fistulogram 10/23 with angioplasty successful, first HD 10/23 morning.    Today feels somewhat better.  Documented yesterday. -Consult Nephrology, appreciate cares -HD per Nephrology   Hyperkalemia ECG normal.  Resolved after HD. -Trend Bmet  Metabolic acidosis Improved after HD  -Trend RFP  Anemia of chronic renal disease Iron stores replete.  FOBT negative.  Smear unremarkable except eosinophilia. Nephrology and I are hoping for some hemoconcentration with HD yesterday, and that Aranesp will start to work soon, because we wish to avoid transfusion for asymptomatic anemia given his hope for transplant.    Asymptomatic today. No dyspnea, chest pain, confusion, dizziness.  Hemoglobin trended slightly up today, we will repeat dialysis today and hopefully he will hemo-concentrate again, as well as that the Aranesp will kick in. -Aranesp -CBC pending this morning -Plan for transfusion threshold ~6 g/dL  Metabolic bone disease -Vit D analogs/supplement per Nephrology  Diabetes Chart history.  Not on meds, and A1c is 5%.  Hypertension BP normal -Hold amlodipine  Eosinophilia  Incidentally noted on smear.  Significance unclear.   Abnormal chest x-ray Focal opacity, no developing symptoms of pneumonia.  Likely edema. -Repeat chest x-ray in 4 weeks  Dysuria -Check urinalysis and urine culture       MDM and disposition: The below labs and imaging reports reviewed and summarized above.  Medication management as above.   The patient was admitted with acute on chronic renal failure, acidosis and hyperkalemia  He is now s/p first HD session.  Consult to Neph for HD, and SW for CLIP.        DVT prophylaxis: SCDs Code Status: FULL Family Communication:      Consultants:   Nephrology  Vascular surgery  Procedures:   10/23 angioplasty of AV fistula and coil embo of competing branch vessel by Dr. Scot Dock  10/23 first HD session        Subjective: All history collected through video phonic interpreter.  He complains of some leg discomfort, dysuria.  No fever, headache, dyspnea, dizziness, chest discomfort.  Still swollen.        Objective: Vitals:   06/04/19 1341 06/04/19 2056 06/05/19 0501 06/05/19 0900  BP: 136/65 134/68 (!) 138/57 130/62  Pulse: 68 74 67 67  Resp: 11 16 15 18   Temp: 97.6 F (36.4 C) 98.4 F (36.9 C) 98.6 F (37 C) 98 F (36.7 C)  TempSrc: Oral Oral Oral Oral  SpO2: 99% 100% 96% 96%  Weight: 79.5 kg     Height:        Intake/Output Summary (Last 24 hours) at 06/05/2019 1345 Last data filed at 06/05/2019 0900 Gross per 24 hour  Intake 300  ml  Output 425 ml  Net -125 ml   Filed Weights   06/03/19 1430 06/04/19 1105 06/04/19 1341  Weight: 60.8 kg 81.3 kg 79.5 kg    Examination: General appearance: Adult male, lying in bed, no acute distress, interactive HEENT: Anicteric, conjunctival pink, lids and lashes normal.  No nasal deformity, discharge, or epistaxis.  Lips moist, mostly edentulous, oropharynx moist without oral lesions, hearing normal.     Skin:   Cardiac: Regular rate and rhythm, no murmurs appreciated, no JVD, 2+ lower extremity edema.  Respiratory: Normal respiratory rate and rhythm, lung sounds diminished bilaterally, but did not appreciate rales or wheezes Abdomen: Abdomen soft, no tenderness palpation or guarding, no ascites or distention. MSK: No deformities or effusions. Neuro: Awake and alert, extraocular movements intact, moves all extremities with normal strength and coordination, speech fluent    Psych: Sensorium intact responding to questions, attention normal, affect normal, judgment insight appear normal.    Data Reviewed: I have personally reviewed following labs and imaging studies:  CBC: Recent Labs  Lab 06/03/19 1230 06/04/19 0559 06/05/19 0541  WBC 6.6 5.6 5.7  NEUTROABS 3.3  --   --   HGB 6.5* 5.8* 6.0*  HCT 20.7* 18.5* 18.4*  MCV 92.0 92.0 88.5  PLT 155 142* [id number]*   Basic Metabolic Panel: Recent Labs  Lab 06/03/19 1230 06/04/19 0559 06/04/19 1838 06/05/19 0541  NA 138 138 139 140  K 6.2* 6.6* 4.7 4.7  CL 111 111 106 107  CO2 14* 13* 18* 20*  GLUCOSE 81 64* 120* 95  BUN 128* 129* 78* 84*  CREATININE 12.80* 13.14* 8.68* 9.36*  CALCIUM 7.8* 7.9* 8.1* 7.9*  PHOS  --  9.0*  --  6.9*   GFR: Estimated Creatinine Clearance: 7.3 mL/min (A) (by C-G formula based on SCr of 9.36 mg/dL (H)). Liver Function Tests: Recent Labs  Lab 06/03/19 1230 06/04/19 0559 06/05/19 0541  AST 13* 11*  --   ALT 13 11  --   ALKPHOS 136* 124  --   BILITOT 0.7 0.5  --   PROT 6.2* 5.7*  --   ALBUMIN 3.0* 2.8* 2.6*   Recent Labs  Lab 06/03/19 1230  LIPASE 37   No results for input(s): AMMONIA in the last 168 hours. Coagulation Profile: No results for input(s): INR, PROTIME in the last 168 hours. Cardiac Enzymes: No results for input(s): CKTOTAL, CKMB, CKMBINDEX, TROPONINI in the last 168 hours. BNP (last 3 results) No results for input(s): PROBNP in the last 8760 hours. HbA1C: Recent Labs    06/04/19 0559  HGBA1C 5.5   CBG: No results for input(s): GLUCAP in the last 168 hours. Lipid  Profile: No results for input(s): CHOL, HDL, LDLCALC, TRIG, CHOLHDL, LDLDIRECT in the last 72 hours. Thyroid Function Tests: No results for input(s): TSH, T4TOTAL, FREET4, T3FREE, THYROIDAB in the last 72 hours. Anemia Panel: Recent Labs    06/04/19 0559  VITAMINB12 258  FOLATE 12.5  FERRITIN 214  TIBC 164*  IRON 150   Urine analysis:    Component Value Date/Time   COLORURINE YELLOW 06/03/2019 [place] 06/03/2019 1354   LABSPEC 1.009 06/03/2019 1354   PHURINE 5.0 06/03/2019 1354   GLUCOSEU 50 (A) 06/03/2019 1354   HGBUR SMALL (A) 06/03/2019 1354   BILIRUBINUR NEGATIVE 06/03/2019 1354   [place] 06/03/2019 1354   PROTEINUR >=300 (A) 06/03/2019 1354   NITRITE NEGATIVE 06/03/2019 1354   LEUKOCYTESUR NEGATIVE 06/03/2019 1354   Sepsis Labs: @LABRCNTIP (procalcitonin:4,lacticacidven:4)  )  Recent Results (from the past 240 hour(s))  SARS CORONAVIRUS 2 (TAT 6-24 HRS) Nasopharyngeal Nasopharyngeal Swab     Status: None   Collection Time: 06/03/19  1:32 PM   Specimen: Nasopharyngeal Swab  Result Value Ref Range Status   SARS Coronavirus 2 NEGATIVE NEGATIVE Final    Comment: (NOTE) SARS-CoV-2 target nucleic acids are NOT DETECTED. The SARS-CoV-2 RNA is generally detectable in upper and lower respiratory specimens during the acute phase of infection. Negative results do not preclude SARS-CoV-2 infection, do not rule out co-infections with other pathogens, and should not be used as the sole basis for treatment or other patient management decisions. Negative results must be combined with clinical observations, patient history, and epidemiological information. The expected result is Negative. Fact Sheet for Patients: SugarRoll.be Fact Sheet for Healthcare Providers: https://www.woods-mathews.com/ This test is not yet approved or cleared by the [place] FDA and  has been authorized for detection and/or  diagnosis of SARS-CoV-2 by FDA under an Emergency Use Authorization (EUA). This EUA will remain  in effect (meaning this test can be used) for the duration of the COVID-19 declaration under Section 56 4(b)(1) of the Act, 21 U.S.C. section 360bbb-3(b)(1), unless the authorization is terminated or revoked sooner. Performed at [facility], [place] [address], [place], [place] [address]   Surgical pcr screen     Status: Abnormal   Collection Time: 06/04/19  7:32 AM   Specimen: Nasal Mucosa; Nasal Swab  Result Value Ref Range Status   MRSA, PCR NEGATIVE NEGATIVE Final   Staphylococcus aureus POSITIVE (A) NEGATIVE Final    Comment: (NOTE) The Xpert SA Assay (FDA approved for NASAL specimens in patients 37 years of age and older), is one component of a comprehensive surveillance program. It is not intended to diagnose infection nor to guide or monitor treatment. Performed at [facility], [place] [address] [place], [place] [address]          Radiology Studies: Dg Chest 2 View  Result Date: 06/04/2019 CLINICAL DATA:  Follow-up right mid lung masslike density EXAM: CHEST - 2 VIEW COMPARISON:  06/03/2019 FINDINGS: Cardiac shadow is enlarged but stable. Rounded density is again identified in the right mid lung with associated mild right pleural thickening. On the lateral projection this projects in the midportion of the right lung along the major fissure likely representing loculated fluid. Noncontrast CT would be confirmatory. The left lung remains clear. No acute bony abnormality is noted. Postsurgical changes in the cervical spine are seen. IMPRESSION: Stable masslike density in the right mid lung which appears to represent loculated fluid within the major fissure. Noncontrast CT would be confirmatory in nature. Electronically Signed   By: Inez Catalina M.D.   On: 06/04/2019 07:44        Scheduled Meds: . sodium chloride   Intravenous Once  . calcitRIOL  0.25 mcg Oral Daily   . Chlorhexidine Gluconate Cloth  6 each Topical Q0600  . cholecalciferol  2,000 Units Oral Daily  . darbepoetin (ARANESP) injection - DIALYSIS  200 mcg Intravenous Q Fri-HD   Continuous Infusions: . sodium chloride    . sodium chloride    . ferric gluconate (FERRLECIT/NULECIT) IV 250 mg (06/05/19 0956)     LOS: 2 days    Time spent: 25 minutes    Edwin Dada, MD [facility] 06/05/2019, 1:45 PM     Please page through [place]:  [email or web address] Password [email or web address] If 7PM-7AM, please contact night-coverage

## 2019-06-06 DIAGNOSIS — R300 Dysuria: Secondary | ICD-10-CM | POA: Diagnosis not present

## 2019-06-06 DIAGNOSIS — N179 Acute kidney failure, unspecified: Secondary | ICD-10-CM | POA: Diagnosis not present

## 2019-06-06 DIAGNOSIS — I10 Essential (primary) hypertension: Secondary | ICD-10-CM | POA: Diagnosis not present

## 2019-06-06 DIAGNOSIS — E875 Hyperkalemia: Secondary | ICD-10-CM | POA: Diagnosis not present

## 2019-06-06 LAB — URINALYSIS, ROUTINE W REFLEX MICROSCOPIC
Bacteria, UA: NONE SEEN
Bilirubin Urine: NEGATIVE
Glucose, UA: 150 mg/dL — AB
Ketones, ur: NEGATIVE mg/dL
Leukocytes,Ua: NEGATIVE
Nitrite: NEGATIVE
Protein, ur: >=300 mg/dL — AB
Specific Gravity, Urine: 1.014 (ref 1.005–1.030)
pH: 6 (ref 5.0–8.0)

## 2019-06-06 LAB — RENAL FUNCTION PANEL
Albumin: 2.6 g/dL — ABNORMAL LOW (ref 3.5–5.0)
Anion gap: 12 (ref 5–15)
BUN: 84 mg/dL — ABNORMAL HIGH (ref 8–23)
CO2: 20 mmol/L — ABNORMAL LOW (ref 22–32)
Calcium: 7.7 mg/dL — ABNORMAL LOW (ref 8.9–10.3)
Chloride: 107 mmol/L (ref 98–111)
Creatinine, Ser: 9.87 mg/dL — ABNORMAL HIGH (ref 0.61–1.24)
GFR calc Af Amer: 6 mL/min — ABNORMAL LOW (ref >60)
GFR calc non Af Amer: 5 mL/min — ABNORMAL LOW (ref >60)
Glucose, Bld: 133 mg/dL — ABNORMAL HIGH (ref 70–99)
Phosphorus: 6.7 mg/dL — ABNORMAL HIGH (ref 2.5–4.6)
Potassium: 4.5 mmol/L (ref 3.5–5.1)
Sodium: 139 mmol/L (ref 135–145)

## 2019-06-06 LAB — CBC
HCT: 17.9 % — ABNORMAL LOW (ref 39.0–52.0)
Hemoglobin: 5.6 g/dL — CL (ref 13.0–17.0)
MCH: 28.4 pg (ref 26.0–34.0)
MCHC: 31.3 g/dL (ref 30.0–36.0)
MCV: 90.9 fL (ref 80.0–100.0)
Platelets: 138 10*3/uL — ABNORMAL LOW (ref 150–400)
RBC: 1.97 MIL/uL — ABNORMAL LOW (ref 4.22–5.81)
RDW: 13.6 % (ref 11.5–15.5)
WBC: 7.7 10*3/uL (ref 4.0–10.5)
nRBC: 0.3 % — ABNORMAL HIGH (ref 0.0–0.2)

## 2019-06-06 LAB — PREPARE RBC (CROSSMATCH)

## 2019-06-06 MED ORDER — SODIUM CHLORIDE 0.9% IV SOLUTION: Freq: Once | INTRAVENOUS | Status: DC

## 2019-06-06 NOTE — Progress Notes (Signed)
[facility]    NEPHROLOGY PROGRESS NOTE  SUBJECTIVE: Patient seen and examined earlier today.  Patient denies chest pain, shortness of breath, nausea or vomiting.  Patient with left upper extremity edema and difficulty accessing fistula on Thursday.  Underwent fistulogram with angioplasty Friday.  Difficulty with fistula on dialysis again yesterday with clotting off of 2 systems.  OBJECTIVE:  Vitals:   06/06/19 0900 06/06/19 1230  BP: 130/66 140/75  Pulse: 68 67  Resp: 16 18  Temp: 98 F (36.7 C) 98 F (36.7 C)  SpO2: 97% 97%    Intake/Output Summary (Last 24 hours) at 06/06/2019 1504 Last data filed at 06/06/2019 1300 Gross per 24 hour  Intake 900 ml  Output 750 ml  Net 150 ml      General:  AAOx3 NAD HEENT: MMM [place] AT anicteric sclera Neck:  No JVD, no adenopathy CV:  Heart RRR  Lungs:  L/S CTA bilaterally Abd:  abd SNT/ND with normal BS GU:  Bladder non-palpable Extremities: +2 left upper extremity edema with mild erythema, thrill weak. Skin:  No skin rash  MEDICATIONS:  . sodium chloride   Intravenous Once  . calcitRIOL  0.25 mcg Oral Daily  . cholecalciferol  2,000 Units Oral Daily  . darbepoetin (ARANESP) injection - DIALYSIS  200 mcg Intravenous Q Fri-HD       LABS:   CBC Latest Ref Rng & Units 06/06/2019 06/05/2019 06/05/2019  WBC 4.0 - 10.5 K/uL 7.7 6.5 5.7  Hemoglobin 13.0 - 17.0 g/dL 5.6(LL) 5.8(LL) 6.0(LL)  Hematocrit 39.0 - 52.0 % 17.9(L) 17.9(L) 18.4(L)  Platelets 150 - 400 K/uL 138(L) 132(L) 144(L)    CMP Latest Ref Rng & Units 06/06/2019 06/05/2019 06/04/2019  Glucose 70 - 99 mg/dL 133(H) 95 120(H)  BUN 8 - 23 mg/dL 84(H) 84(H) 78(H)  Creatinine 0.61 - 1.24 mg/dL 9.87(H) 9.36(H) 8.68(H)  Sodium 135 - 145 mmol/L 139 140 139  Potassium 3.5 - 5.1 mmol/L 4.5 4.7 4.7  Chloride 98 - 111 mmol/L 107 107 106  CO2 22 - 32 mmol/L 20(L) 20(L) 18(L)  Calcium 8.9 - 10.3 mg/dL 7.7(L) 7.9(L) 8.1(L)  Total Protein 6.5 - 8.1 g/dL - - -   Total Bilirubin 0.3 - 1.2 mg/dL - - -  Alkaline Phos 38 - 126 U/L - - -  AST 15 - 41 U/L - - -  ALT 0 - 44 U/L - - -    Lab Results  Component Value Date   PTH 114 (H) 06/04/2019   CALCIUM 7.7 (L) 06/06/2019   PHOS 6.7 (H) 06/06/2019       Component Value Date/Time   COLORURINE YELLOW 06/03/2019 [place] 06/03/2019 1354   LABSPEC 1.009 06/03/2019 1354   PHURINE 5.0 06/03/2019 1354   GLUCOSEU 50 (A) 06/03/2019 1354   HGBUR SMALL (A) 06/03/2019 1354   BILIRUBINUR NEGATIVE 06/03/2019 [place] 06/03/2019 1354   PROTEINUR >=300 (A) 06/03/2019 1354   NITRITE NEGATIVE 06/03/2019 1354   LEUKOCYTESUR NEGATIVE 06/03/2019 1354   No results found for: PHART, PCO2ART, PO2ART, HCO3, TCO2, ACIDBASEDEF, O2SAT     Component Value Date/Time   IRON 150 06/04/2019 0559   TIBC 164 (L) 06/04/2019 0559   FERRITIN 214 06/04/2019 0559   IRONPCTSAT 92 (H) 06/04/2019 0559       ASSESSMENT/PLAN:    Ryan Wells is a 63 y.o. male with longstanding diabetes mellitus and hypertension as well as CKD followed by Dr. Joelyn Oms at [facility].  He has had progressive CKD with a creatinine over 5 for quite some time.  1.  End-stage renal disease.  Initiated dialysis this admission.  Status post dialysis yesterday today with minimal treatment time.  Will need clip for outpatient dialysis.  Will place tunneled dialysis catheter for access.  Resting left upper extremity AV fistula.  2.  Hypertension.  Stable.  3.  Anemia.  Transfuse as needed.  Dropping hemoglobin likely secondary to clotting off dialysis circuits.  4.  Vascular access.  Status post angioplasty today of left upper extremity AV fistula.  Still with difficulty accessing.  Will rest AV fistula and plan tunneled dialysis catheter either tomorrow or Tuesday morning.  5.  Hyperkalemia.  Improved status post HD yesterday   [employer], DO, FACP

## 2019-06-06 NOTE — Progress Notes (Signed)
PROGRESS NOTE    Ryan Wells  [id number] DOB: [birth year] DOA: 06/03/2019 PCP: Mack Hook, MD      Brief Narrative:  Ryan Wells is a 63 y.o. 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 Joelyn Oms who presented with progressive SOB with exertion, malaise, decreased UOP and now swelling over the last few days to weeks.  In the ER, Cr 12.8, K 6.2, Hgb 6.5 and CXR with focal opacity, no effusions.  Nephrology were consulted and recommended initiating dialysis.       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  Found to have progression of his renal failure, now acidotic, overloaded and hyperkalemic.    Fistulogram 10/23 with angioplasty successful, first HD 10/23 morning.    Attempted HD yesterday, fistula clotted off. -Consult Nephrology and Vascular surgery, appreciate cares -HD per Nephrology   Hyperkalemia ECG normal on admission, resolved after HD.    K normal today  -Repeat Bmet  Metabolic acidosis Resolved - Trend RFP  Anemia of chronic renal disease Iron stores replete.  FOBT negative.  Smear unremarkable except eosinophilia. Nephrology and I are hoping for some hemoconcentration with HD yesterday, and that Aranesp will start to work soon, because we wish to avoid transfusion for asymptomatic anemia given his hope for transplant.    Still asymptomatic.  Hgb 5.8 --> 5.6 g/dL this AM.   -Transfuse 1 unit -Aranesp  Metabolic bone disease -Vit D analogs/supplement per Nephrology  Diabetes Chart history.  Not on meds, and A1c is 5%.  Hypertension BP high normal -Hold amlodipine for now, can restart tomorrow  Eosinophilia  Incidentally noted on smear.  Significance unclear.  Abnormal chest x-ray Focal opacity, no developing symptoms of pneumonia.  Likely edema. -Repeat chest x-ray in 4 weeks  Dysuria -Pending  urinalysis and urine culture       MDM and disposition: The below labs and imaging reports reviewed and summarized above.  Medication management as above.   The patient was admitted with acute on chronic renal failure, acidosis and hyperkalemia  He will need serial dialysis and placement to an outpatient center.  Still severely fluid overloaded and without consistently functional dialysis catheter.      DVT prophylaxis: SCDs Code Status: FULL Family Communication:      Consultants:   Nephrology  Vascular surgery  Procedures:   10/23 angioplasty of AV fistula and coil embo of competing branch vessel by Dr. Scot Dock  10/23 first HD session        Subjective: All history collected through video phonic interpreter.  Today he is complaining of swelling, lack of urine output..  No confusion, fever, dyspnea, orthopnea, chest pain, dizziness.      Objective: Vitals:   06/06/19 0800 06/06/19 0830 06/06/19 0900 06/06/19 1230  BP: (!) 146/67 (!) 146/67 130/66 140/75  Pulse: 68 68 68 67  Resp: 18 18 16 18   Temp: 99.1 F (37.3 C) 99.1 F (37.3 C) 98 F (36.7 C) 98 F (36.7 C)  TempSrc: Oral Oral Oral Oral  SpO2: 95% 95% 97% 97%  Weight:      Height:        Intake/Output Summary (Last 24 hours) at 06/06/2019 1501 Last data filed at 06/06/2019 1300 Gross per 24 hour  Intake 900 ml  Output 750 ml  Net 150 ml   [employer]  06/03/19 1430 06/04/19 1105 06/04/19 1341  Weight: 60.8 kg 81.3 kg 79.5 kg    Examination: General appearance: Adult male, lying in bed, no acute distress, interactive. HEENT: Anicteric, conjunctival pink, lids and lashes normal.  No nasal deformity, discharge, or epistaxis.  Lips moist, commercial edentulous, oropharynx moist without oral lesions, hearing normal.     Skin:   Cardiac: Tachycardic, regular, no murmurs, 3+ lower extremity edema, edema of the arms Respiratory: Slightly tachypneic, lung sounds diminished bilaterally,  no rales.   Abdomen: Abdomen soft without focal tenderness palpation, no guarding, no rigidity, no ascites. MSK: Normal muscle bulk and tone Neuro: Awake and alert, extraocular movements intact, moves all extremities normal strength and coordination, speech fluent.  Mild tremor. Psych: Sensorium intact responding questions, attention normal, affect anxious, judgment insight appear normal.      Data Reviewed: I have personally reviewed following labs and imaging studies:  CBC: Recent Labs  Lab 06/03/19 1230 06/04/19 0559 06/05/19 0541 06/05/19 1645 06/06/19 0519  WBC 6.6 5.6 5.7 6.5 7.7  NEUTROABS 3.3  --   --   --   --   HGB 6.5* 5.8* 6.0* 5.8* 5.6*  HCT 20.7* 18.5* 18.4* 17.9* 17.9*  MCV 92.0 92.0 88.5 88.6 90.9  PLT 155 142* 144* 132* 0000000*   Basic Metabolic Panel: Recent Labs  Lab 06/03/19 1230 06/04/19 0559 06/04/19 1838 06/05/19 0541 06/06/19 0519  NA 138 138 139 140 139  K 6.2* 6.6* 4.7 4.7 4.5  CL 111 111 106 107 107  CO2 14* 13* 18* 20* 20*  GLUCOSE 81 64* 120* 95 133*  BUN 128* 129* 78* 84* 84*  CREATININE 12.80* 13.14* 8.68* 9.36* 9.87*  CALCIUM 7.8* 7.9* 8.1* 7.9* 7.7*  PHOS  --  9.0*  --  6.9* 6.7*   GFR: Estimated Creatinine Clearance: 6.9 mL/min (A) (by C-G formula based on SCr of 9.87 mg/dL (H)). Liver Function Tests: Recent Labs  Lab 06/03/19 1230 06/04/19 0559 06/05/19 0541 06/06/19 0519  AST 13* 11*  --   --   ALT 13 11  --   --   ALKPHOS 136* 124  --   --   BILITOT 0.7 0.5  --   --   PROT 6.2* 5.7*  --   --   ALBUMIN 3.0* 2.8* 2.6* 2.6*   Recent Labs  Lab 06/03/19 1230  LIPASE 37   No results for input(s): AMMONIA in the last 168 hours. Coagulation Profile: No results for input(s): INR, PROTIME in the last 168 hours. Cardiac Enzymes: No results for input(s): CKTOTAL, CKMB, CKMBINDEX, TROPONINI in the last 168 hours. BNP (last 3 results) No results for input(s): PROBNP in the last 8760 hours. HbA1C: Recent Labs    06/04/19  0559  HGBA1C 5.5   CBG: No results for input(s): GLUCAP in the last 168 hours. Lipid Profile: No results for input(s): CHOL, HDL, LDLCALC, TRIG, CHOLHDL, LDLDIRECT in the last 72 hours. Thyroid Function Tests: No results for input(s): TSH, T4TOTAL, FREET4, T3FREE, THYROIDAB in the last 72 hours. Anemia Panel: Recent Labs    06/04/19 0559  VITAMINB12 258  FOLATE 12.5  FERRITIN 214  TIBC 164*  IRON 150   Urine analysis:    Component Value Date/Time   COLORURINE YELLOW 06/03/2019 [place] 06/03/2019 1354   LABSPEC 1.009 06/03/2019 1354   PHURINE 5.0 06/03/2019 1354   GLUCOSEU 50 (A) 06/03/2019 1354   HGBUR SMALL (A) 06/03/2019 1354   BILIRUBINUR NEGATIVE 06/03/2019 1354  KETONESUR NEGATIVE 06/03/2019 1354   PROTEINUR >=300 (A) 06/03/2019 1354   NITRITE NEGATIVE 06/03/2019 1354   LEUKOCYTESUR NEGATIVE 06/03/2019 1354   Sepsis Labs: @LABRCNTIP (procalcitonin:4,lacticacidven:4)  ) Recent Results (from the past 240 hour(s))  SARS CORONAVIRUS 2 (TAT 6-24 HRS) Nasopharyngeal Nasopharyngeal Swab     Status: None   Collection Time: 06/03/19  1:32 PM   Specimen: Nasopharyngeal Swab  Result Value Ref Range Status   SARS Coronavirus 2 NEGATIVE NEGATIVE Final    Comment: (NOTE) SARS-CoV-2 target nucleic acids are NOT DETECTED. The SARS-CoV-2 RNA is generally detectable in upper and lower respiratory specimens during the acute phase of infection. Negative results do not preclude SARS-CoV-2 infection, do not rule out co-infections with other pathogens, and should not be used as the sole basis for treatment or other patient management decisions. Negative results must be combined with clinical observations, patient history, and epidemiological information. The expected result is Negative. Fact Sheet for Patients: SugarRoll.be Fact Sheet for Healthcare Providers: https://www.woods-mathews.com/ This test is not yet  approved or cleared by the [place] FDA and  has been authorized for detection and/or diagnosis of SARS-CoV-2 by FDA under an Emergency Use Authorization (EUA). This EUA will remain  in effect (meaning this test can be used) for the duration of the COVID-19 declaration under Section 56 4(b)(1) of the Act, 21 U.S.C. section 360bbb-3(b)(1), unless the authorization is terminated or revoked sooner. Performed at [facility], [place] [address] [place], [place] [address]   Surgical pcr screen     Status: Abnormal   Collection Time: 06/04/19  7:32 AM   Specimen: Nasal Mucosa; Nasal Swab  Result Value Ref Range Status   MRSA, PCR NEGATIVE NEGATIVE Final   Staphylococcus aureus POSITIVE (A) NEGATIVE Final    Comment: (NOTE) The Xpert SA Assay (FDA approved for NASAL specimens in patients 86 years of age and older), is one component of a comprehensive surveillance program. It is not intended to diagnose infection nor to guide or monitor treatment. Performed at [facility], [place] [address] [place], [place] [address]          Radiology Studies: No results found.      Scheduled Meds: . sodium chloride   Intravenous Once  . calcitRIOL  0.25 mcg Oral Daily  . cholecalciferol  2,000 Units Oral Daily  . darbepoetin (ARANESP) injection - DIALYSIS  200 mcg Intravenous Q Fri-HD   Continuous Infusions:    LOS: 3 days    Time spent: [place], MD [facility] 06/06/2019, 3:01 PM     Please page through [place]:  [email or web address] Password [email or web address] If 7PM-7AM, please contact night-coverage

## 2019-06-06 NOTE — Progress Notes (Signed)
[facility]  Subjective  - left brachiocephalic fistula infiltrated yesterday.   Objective 130/66 68 98 F (36.7 C) (Oral) 16 97%  Intake/Output Summary (Last 24 hours) at 06/06/2019 1209 Last data filed at 06/06/2019 1200 Gross per 24 hour  Intake 900 ml  Output 750 ml  Net 150 ml    Left brachiocephalic fistula with weak thrill Arm full from infiltration  Laboratory Lab Results: Recent Labs    06/05/19 1645 06/06/19 0519  WBC 6.5 7.7  HGB 5.8* 5.6*  HCT 17.9* 17.9*  PLT 132* 138*   BMET Recent Labs    06/05/19 0541 06/06/19 0519  NA 140 139  K 4.7 4.5  CL 107 107  CO2 20* 20*  GLUCOSE 95 133*  BUN 84* 84*  CREATININE 9.36* 9.87*  CALCIUM 7.9* 7.7*    COAG No results found for: INR, PROTIME No results found for: PTT  Assessment/Planning:  63 yo M with ESRD that was evaluated by Dr. Scot Dock last week.  He underwent left brachiocephalic fistulogram and had balloon angioplasty x 2 and coil embolization of large competing branch on 06/04/19.  Had dialysis on Friday and had to run at low flow.  Yesterday clotted off on dialysis and now concern for infiltration.    Fistula does appear infiltrated.  Need to rest fistula in left arm.  Thrill weak.  Need TDC.  Will tentatively plan for [facility] Tuesday if potassium ok. Will keep NPO tonight in case needs catheter urgently tomorrow and will do our best to get it placed.      Marty Heck 06/06/2019 12:09 PM --

## 2019-06-07 ENCOUNTER — Encounter (HOSPITAL_COMMUNITY): Payer: Self-pay | Admitting: Vascular Surgery

## 2019-06-07 DIAGNOSIS — R300 Dysuria: Secondary | ICD-10-CM | POA: Diagnosis not present

## 2019-06-07 DIAGNOSIS — N179 Acute kidney failure, unspecified: Secondary | ICD-10-CM | POA: Diagnosis not present

## 2019-06-07 DIAGNOSIS — I10 Essential (primary) hypertension: Secondary | ICD-10-CM | POA: Diagnosis not present

## 2019-06-07 DIAGNOSIS — E875 Hyperkalemia: Secondary | ICD-10-CM | POA: Diagnosis not present

## 2019-06-07 LAB — CBC
HCT: 22.2 % — ABNORMAL LOW (ref 39.0–52.0)
Hemoglobin: 7.2 g/dL — ABNORMAL LOW (ref 13.0–17.0)
MCH: 29.6 pg (ref 26.0–34.0)
MCHC: 32.4 g/dL (ref 30.0–36.0)
MCV: 91.4 fL (ref 80.0–100.0)
Platelets: 158 10*3/uL (ref 150–400)
RBC: 2.43 MIL/uL — ABNORMAL LOW (ref 4.22–5.81)
RDW: 13.7 % (ref 11.5–15.5)
WBC: 9.3 10*3/uL (ref 4.0–10.5)
nRBC: 1.6 % — ABNORMAL HIGH (ref 0.0–0.2)

## 2019-06-07 LAB — BPAM RBC
Blood Product Expiration Date: 202011272359
ISSUE DATE / TIME: 202010250833
Unit Type and Rh: 5100

## 2019-06-07 LAB — RENAL FUNCTION PANEL
Albumin: 2.7 g/dL — ABNORMAL LOW (ref 3.5–5.0)
Anion gap: 12 (ref 5–15)
BUN: 89 mg/dL — ABNORMAL HIGH (ref 8–23)
CO2: 19 mmol/L — ABNORMAL LOW (ref 22–32)
Calcium: 7.8 mg/dL — ABNORMAL LOW (ref 8.9–10.3)
Chloride: 107 mmol/L (ref 98–111)
Creatinine, Ser: 10.76 mg/dL — ABNORMAL HIGH (ref 0.61–1.24)
GFR calc Af Amer: 5 mL/min — ABNORMAL LOW (ref >60)
GFR calc non Af Amer: 4 mL/min — ABNORMAL LOW (ref >60)
Glucose, Bld: 105 mg/dL — ABNORMAL HIGH (ref 70–99)
Phosphorus: 6.4 mg/dL — ABNORMAL HIGH (ref 2.5–4.6)
Potassium: 4.5 mmol/L (ref 3.5–5.1)
Sodium: 138 mmol/L (ref 135–145)

## 2019-06-07 LAB — URINE CULTURE: Culture: NO GROWTH

## 2019-06-07 LAB — TYPE AND SCREEN
ABO/RH(D): O POS
Antibody Screen: NEGATIVE
Unit division: 0

## 2019-06-07 MED ORDER — CEFAZOLIN SODIUM-DEXTROSE 1-4 GM/50ML-% IV SOLN
1.0000 g | INTRAVENOUS | Status: AC
  Administered 2019-06-08: 2 g via INTRAVENOUS
  Filled 2019-06-07: qty 50

## 2019-06-07 MED ORDER — ONDANSETRON HCL 4 MG/2ML IJ SOLN
4.0000 mg | Freq: Three times a day (TID) | INTRAMUSCULAR | Status: DC | PRN
  Administered 2019-06-07: 4 mg via INTRAVENOUS
  Filled 2019-06-07: qty 2

## 2019-06-07 MED ORDER — CHLORHEXIDINE GLUCONATE CLOTH 2 % EX PADS
6.0000 | MEDICATED_PAD | Freq: Every day | CUTANEOUS | Status: DC
  Administered 2019-06-08: 6 via TOPICAL

## 2019-06-07 MED ORDER — AMLODIPINE BESYLATE 5 MG PO TABS
5.0000 mg | ORAL_TABLET | Freq: Every day | ORAL | Status: DC
  Administered 2019-06-07 – 2019-06-09 (×3): 5 mg via ORAL
  Filled 2019-06-07 (×3): qty 1

## 2019-06-07 NOTE — Plan of Care (Signed)
  Problem: Education: Goal: Knowledge of General Education information will improve Description: Including pain rating scale, medication(s)/side effects and non-pharmacologic comfort measures Outcome: Progressing   Problem: Clinical Measurements: Goal: Respiratory complications will improve Outcome: Progressing   Problem: Coping: Goal: Level of anxiety will decrease Outcome: Progressing   Problem: Education: Goal: Knowledge of disease and its progression will improve 06/07/2019 1123 by Dolores Hoose, RN Outcome: Progressing 06/07/2019 0757 by Dolores Hoose, RN Outcome: Progressing   Problem: Fluid Volume: Goal: Compliance with measures to maintain balanced fluid volume will improve 06/07/2019 1123 by Dolores Hoose, RN Outcome: Progressing 06/07/2019 0757 by Dolores Hoose, RN Outcome: Progressing

## 2019-06-07 NOTE — Progress Notes (Signed)
[name]    NEPHROLOGY PROGRESS NOTE  SUBJECTIVE: Patient seen and examined.  Patient denies chest pain, shortness of breath, nausea or vomiting.  Patient with left upper extremity edema and difficulty accessing fistula on Thursday.  Underwent fistulogram with angioplasty Friday.  Difficulty with fistula on dialysis again Saturday with clotting off of 2 systems.  Plan to rest to allow infiltration to heal.  OBJECTIVE:  Vitals:   06/06/19 1951 06/07/19 0441  BP: (!) 150/70 (!) 142/78  Pulse: 67 68  Resp: 18 18  Temp: 98.8 F (37.1 C) 98.6 F (37 C)  SpO2: 100% 96%    Intake/Output Summary (Last 24 hours) at 06/07/2019 1322 Last data filed at 06/07/2019 1100 Gross per 24 hour  Intake 540 ml  Output 300 ml  Net 240 ml      General:  AAOx3 NAD HEENT: MMM [place] AT anicteric sclera Neck:  No JVD, no adenopathy CV:  Heart RRR  Lungs:  L/S CTA bilaterally Abd:  abd SNT/ND with normal BS GU:  Bladder non-palpable Extremities: +2 left upper extremity edema with mild erythema, thrill weak. Skin:  No skin rash  MEDICATIONS:  . sodium chloride   Intravenous Once  . calcitRIOL  0.25 mcg Oral Daily  . cholecalciferol  2,000 Units Oral Daily  . darbepoetin (ARANESP) injection - DIALYSIS  200 mcg Intravenous Q Fri-HD       LABS:   CBC Latest Ref Rng & Units 06/07/2019 06/06/2019 06/05/2019  WBC 4.0 - 10.5 K/uL 9.3 7.7 6.5  Hemoglobin 13.0 - 17.0 g/dL 7.2(L) 5.6(LL) 5.8(LL)  Hematocrit 39.0 - 52.0 % 22.2(L) 17.9(L) 17.9(L)  Platelets 150 - 400 K/uL 158 138(L) 132(L)    CMP Latest Ref Rng & Units 06/07/2019 06/06/2019 06/05/2019  Glucose 70 - 99 mg/dL 105(H) 133(H) 95  BUN 8 - 23 mg/dL 89(H) 84(H) 84(H)  Creatinine 0.61 - 1.24 mg/dL 10.76(H) 9.87(H) 9.36(H)  Sodium 135 - 145 mmol/L 138 139 140  Potassium 3.5 - 5.1 mmol/L 4.5 4.5 4.7  Chloride 98 - 111 mmol/L 107 107 107  CO2 22 - 32 mmol/L 19(L) 20(L) 20(L)  Calcium 8.9 - 10.3 mg/dL 7.8(L) 7.7(L) 7.9(L)   Total Protein 6.5 - 8.1 g/dL - - -  Total Bilirubin 0.3 - 1.2 mg/dL - - -  Alkaline Phos 38 - 126 U/L - - -  AST 15 - 41 U/L - - -  ALT 0 - 44 U/L - - -    Lab Results  Component Value Date   PTH 114 (H) 06/04/2019   CALCIUM 7.8 (L) 06/07/2019   PHOS 6.4 (H) 06/07/2019       Component Value Date/Time   COLORURINE YELLOW 06/06/2019 [place] 06/06/2019 1530   LABSPEC 1.014 06/06/2019 1530   PHURINE 6.0 06/06/2019 1530   GLUCOSEU 150 (A) 06/06/2019 1530   HGBUR SMALL (A) 06/06/2019 [place] 06/06/2019 [place] 06/06/2019 1530   PROTEINUR >=300 (A) 06/06/2019 1530   NITRITE NEGATIVE 06/06/2019 [place] 06/06/2019 1530   No results found for: PHART, PCO2ART, PO2ART, HCO3, TCO2, ACIDBASEDEF, O2SAT     Component Value Date/Time   IRON 150 06/04/2019 0559   TIBC 164 (L) 06/04/2019 0559   FERRITIN 214 06/04/2019 0559   IRONPCTSAT 92 (H) 06/04/2019 0559       ASSESSMENT/PLAN:    Ryan Wells is a 63 y.o. male with longstanding diabetes mellitus and hypertension as  well as CKD followed by Ryan Wells at [facility].  He has had progressive CKD with a creatinine over 5 for quite some time.  1.  End-stage renal disease.  Initiated dialysis this admission.  Status post dialysis yesterday today with minimal treatment time.  Will need clip for outpatient dialysis.  Will place tunneled dialysis catheter for access.  Resting left upper extremity AV fistula.  2.  Hypertension.  Stable.  3.  Anemia.  Transfuse as needed.  Dropping hemoglobin likely secondary to clotting off dialysis circuits.  4.  Vascular access.  Status post angioplasty today of left upper extremity AV fistula.  Still with difficulty accessing.  Will rest AV fistula and plan tunneled dialysis catheter Tuesday morning.  5.  Hyperkalemia.  Improved   [employer], DO, FACP

## 2019-06-07 NOTE — Care Management Important Message (Signed)
Important Message  Patient Details  Name: Ryan Wells MRN: [id number] Date of Birth: March 05, 1956   Medicare Important Message Given:  Yes     Orbie Pyo 06/07/2019, 3:28 PM

## 2019-06-07 NOTE — Progress Notes (Signed)
PROGRESS NOTE    Ryan Wells  [id number] DOB: 1956-02-19 DOA: 06/03/2019 PCP: Mack Hook, MD      Brief Narrative:  [name] is a 63 y.o. 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 Joelyn Oms who presented with progressive SOB with exertion, malaise, decreased UOP and now swelling over the last few days to weeks.  In the ER, Cr 12.8, K 6.2, Hgb 6.5 and CXR with focal opacity, no effusions.  Nephrology were consulted and recommended initiating dialysis.       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  Found to have progression of his renal failure, now acidotic, overloaded and hyperkalemic.    10/22 attempted HD but couldn't access fistula 10/23 Fistulogram 10/23 with angioplasty successful, first HD 10/23 morning.   10/24 Subsequently fistula clotted off again.   -Consult Nephrology and Vascular surgery, appreciate cares -TDC planned tomrorow -HD as soon as able   Hyperkalemia Resolved with HD  K stable today -Repeat Bmet  Metabolic acidosis Bicarb stable 19 today - Trend RFP  Anemia of chronic renal disease Transfused 1 unit 10/25 Asymptomatic -Hope to avoid transfusion, given future transplant listing   -Aranesp  Metabolic bone disease -Vit D analogs/supplement per Nephrology  Diabetes Chart history.  Not on meds, and A1c is 5%.  Hypertension BP elevated -Resume amlodipine  Eosinophilia  Incidentally noted on smear.  Significance unclear.  Abnormal chest x-ray Focal opacity, no developing symptoms of pneumonia.  Likely edema. -Repeat chest x-ray in 4 weeks  Dysuria Urine culture no growth, likely this was from penis edema.       MDM and disposition: The below labs and imaging reports reviewed and summarized above.  Medication management as above.   The patient was admitted with acute on  chronic renal failure, acidosis and hyperkalemia  He will need serial dialysis and placement to an outpatient center.  Still severely fluid overloaded and without consistently functional dialysis catheter.      DVT prophylaxis: SCDs Code Status: FULL Family Communication:      Consultants:   Nephrology  Vascular surgery  Procedures:   10/23 angioplasty of AV fistula and coil embo of competing branch vessel by Dr. Scot Dock  10/23 first HD session  10/27 planned [facility]        Subjective: All history collected through video phonic interpreter.  He has some nausea today, still swollen, no chest pain, dizziness, confusion, fever, orthopnea, syncope.         Objective: Vitals:   06/06/19 1610 06/06/19 1951 06/07/19 0441 06/07/19 1343  BP: (!) 141/69 (!) 150/70 (!) 142/78 (!) 155/69  Pulse: 63 67 68 69  Resp: 18 18 18    Temp: 99.3 F (37.4 C) 98.8 F (37.1 C) 98.6 F (37 C) 98.8 F (37.1 C)  TempSrc: Oral Oral Oral Oral  SpO2: 98% 100% 96% 98%  Weight:      Height:        Intake/Output Summary (Last 24 hours) at 06/07/2019 1508 Last data filed at 06/07/2019 1300 Gross per 24 hour  Intake 540 ml  Output 550 ml  Net -10 ml   Filed Weights   06/03/19 1430 06/04/19 1105 06/04/19 1341  Weight: 60.8 kg 81.3 kg 79.5 kg    Examination: General appearance: Adult male, lying in bed, appears well, no acute distress, appears  uncomfortable.    HEENT: Anicteric, conjunctival pink, lids and lashes normal.  No nasal deformity, discharge, or epistaxis.  Lips moist, commercial edentulous, oropharynx moist without oral lesions, hearing normal.     Skin:   Cardiac: RRR, no murmurs, 3+ pitting lower extremity edema, edema of the arms. Respiratory: Respiratory effort shallow, lung sounds diminished bilaterally.  No rales or wheezes. Abdomen: Abdomen soft without focal tenderness to palpation, no guarding or rigidity. MSK: No muscle bulk and tone Neuro: Awake and alert,  extraocular movements intact, moves all extremities with normal strength coronation, speech fluent.  Mild tremor. Psych: Responding to questions, attention normal, affect normal, judgment insight appear normal.    Data Reviewed: I have personally reviewed following labs and imaging studies:  CBC: Recent Labs  Lab 06/03/19 1230 06/04/19 0559 06/05/19 0541 06/05/19 1645 06/06/19 0519 06/07/19 0741  WBC 6.6 5.6 5.7 6.5 7.7 9.3  NEUTROABS 3.3  --   --   --   --   --   HGB 6.5* 5.8* 6.0* 5.8* 5.6* 7.2*  HCT 20.7* 18.5* 18.4* 17.9* 17.9* 22.2*  MCV 92.0 92.0 88.5 88.6 90.9 91.4  PLT 155 142* 144* 132* 138* 0000000   Basic Metabolic Panel: Recent Labs  Lab 06/04/19 0559 06/04/19 1838 06/05/19 0541 06/06/19 0519 06/07/19 0741  NA 138 139 140 139 138  K 6.6* 4.7 4.7 4.5 4.5  CL 111 106 107 107 107  CO2 13* 18* 20* 20* 19*  GLUCOSE 64* 120* 95 133* 105*  BUN 129* 78* 84* 84* 89*  CREATININE 13.14* 8.68* 9.36* 9.87* 10.76*  CALCIUM 7.9* 8.1* 7.9* 7.7* 7.8*  PHOS 9.0*  --  6.9* 6.7* 6.4*   GFR: Estimated Creatinine Clearance: 6.3 mL/min (A) (by C-G formula based on SCr of 10.76 mg/dL (H)). Liver Function Tests: Recent Labs  Lab 06/03/19 1230 06/04/19 0559 06/05/19 0541 06/06/19 0519 06/07/19 0741  AST 13* 11*  --   --   --   ALT 13 11  --   --   --   ALKPHOS 136* 124  --   --   --   BILITOT 0.7 0.5  --   --   --   PROT 6.2* 5.7*  --   --   --   ALBUMIN 3.0* 2.8* 2.6* 2.6* 2.7*   Recent Labs  Lab 06/03/19 1230  LIPASE 37   No results for input(s): AMMONIA in the last 168 hours. Coagulation Profile: No results for input(s): INR, PROTIME in the last 168 hours. Cardiac Enzymes: No results for input(s): CKTOTAL, CKMB, CKMBINDEX, TROPONINI in the last 168 hours. BNP (last 3 results) No results for input(s): PROBNP in the last 8760 hours. HbA1C: No results for input(s): HGBA1C in the last 72 hours. CBG: No results for input(s): GLUCAP in the last 168 hours. Lipid  Profile: No results for input(s): CHOL, HDL, LDLCALC, TRIG, CHOLHDL, LDLDIRECT in the last 72 hours. Thyroid Function Tests: No results for input(s): TSH, T4TOTAL, FREET4, T3FREE, THYROIDAB in the last 72 hours. Anemia Panel: No results for input(s): VITAMINB12, FOLATE, FERRITIN, TIBC, IRON, RETICCTPCT in the last 72 hours. Urine analysis:    Component Value Date/Time   COLORURINE YELLOW 06/06/2019 1530   APPEARANCEUR CLEAR 06/06/2019 1530   LABSPEC 1.014 06/06/2019 1530   PHURINE 6.0 06/06/2019 1530   GLUCOSEU 150 (A) 06/06/2019 1530   HGBUR SMALL (A) 06/06/2019 1530   BILIRUBINUR NEGATIVE 06/06/2019 [place] 06/06/2019 1530   PROTEINUR >=300 (A) 06/06/2019 1530  NITRITE NEGATIVE 06/06/2019 [place] 06/06/2019 1530   Sepsis Labs: @LABRCNTIP (procalcitonin:4,lacticacidven:4)  ) Recent Results (from the past 240 hour(s))  SARS CORONAVIRUS 2 (TAT 6-24 HRS) Nasopharyngeal Nasopharyngeal Swab     Status: None   Collection Time: 06/03/19  1:32 PM   Specimen: Nasopharyngeal Swab  Result Value Ref Range Status   SARS Coronavirus 2 NEGATIVE NEGATIVE Final    Comment: (NOTE) SARS-CoV-2 target nucleic acids are NOT DETECTED. The SARS-CoV-2 RNA is generally detectable in upper and lower respiratory specimens during the acute phase of infection. Negative results do not preclude SARS-CoV-2 infection, do not rule out co-infections with other pathogens, and should not be used as the sole basis for treatment or other patient management decisions. Negative results must be combined with clinical observations, patient history, and epidemiological information. The expected result is Negative. Fact Sheet for Patients: SugarRoll.be Fact Sheet for Healthcare Providers: https://www.woods-mathews.com/ This test is not yet approved or cleared by the [place] FDA and  has been authorized for detection and/or  diagnosis of SARS-CoV-2 by FDA under an Emergency Use Authorization (EUA). This EUA will remain  in effect (meaning this test can be used) for the duration of the COVID-19 declaration under Section 56 4(b)(1) of the Act, 21 U.S.C. section 360bbb-3(b)(1), unless the authorization is terminated or revoked sooner. Performed at [facility], [place] [address] [place], [place] [address]   Surgical pcr screen     Status: Abnormal   Collection Time: 06/04/19  7:32 AM   Specimen: Nasal Mucosa; Nasal Swab  Result Value Ref Range Status   MRSA, PCR NEGATIVE NEGATIVE Final   Staphylococcus aureus POSITIVE (A) NEGATIVE Final    Comment: (NOTE) The Xpert SA Assay (FDA approved for NASAL specimens in patients 29 years of age and older), is one component of a comprehensive surveillance program. It is not intended to diagnose infection nor to guide or monitor treatment. Performed at [facility], [place] [address] [place], [place] [address]   Culture, Urine     Status: None   Collection Time: 06/06/19  3:17 PM   Specimen: Urine, Random  Result Value Ref Range Status   Specimen Description URINE, RANDOM  Final   Special Requests NONE  Final   Culture   Final    NO GROWTH Performed at [facility], [place] [address], [place], [place] [address]    Report Status 06/07/2019 FINAL  Final         Radiology Studies: No results found.      Scheduled Meds:  sodium chloride   Intravenous Once   calcitRIOL  0.25 mcg Oral Daily   cholecalciferol  2,000 Units Oral Daily   darbepoetin (ARANESP) injection - DIALYSIS  200 mcg Intravenous Q Fri-HD   Continuous Infusions:  [START ON 06/08/2019]  ceFAZolin (ANCEF) IV       LOS: 4 days    Time spent: 25 minutes   Edwin Dada, MD [facility] 06/07/2019, 3:08 PM     Please page through [place]:  [email or web address] Password [email or web address] If 7PM-7AM, please contact night-coverage

## 2019-06-07 NOTE — Progress Notes (Signed)
   VASCULAR SURGERY ASSESSMENT & PLAN:   END-STAGE RENAL DISEASE: The patient infiltrated his left upper arm fistula.  This will need to rest.  He is scheduled for placement of a tunneled dialysis catheter tomorrow a.m.  I have discussed this with the patient and he is agreeable to proceed.  ANEMIA: Hemoglobin is 7.2.  May need transfusion.  We will defer to primary service.  SUBJECTIVE:   No complaints.  PHYSICAL EXAM:   Vitals:   06/06/19 1230 06/06/19 1610 06/06/19 1951 06/07/19 0441  BP: 140/75 (!) 141/69 (!) 150/70 (!) 142/78  Pulse: 67 63 67 68  Resp: 18 18 18 18   Temp: 98 F (36.7 C) 99.3 F (37.4 C) 98.8 F (37.1 C) 98.6 F (37 C)  TempSrc: Oral Oral Oral Oral  SpO2: 97% 98% 100% 96%  Weight:      Height:       His left upper arm fistula still has a good thrill.  LABS:   Lab Results  Component Value Date   WBC 9.3 06/07/2019   HGB 7.2 (L) 06/07/2019   HCT 22.2 (L) 06/07/2019   MCV 91.4 06/07/2019   PLT 158 06/07/2019   Potassium is 4.5 today   PROBLEM LIST:    Active Problems:   Volume overload   Metabolic acidosis   Hyperkalemia   CURRENT MEDS:   . sodium chloride   Intravenous Once  . calcitRIOL  0.25 mcg Oral Daily  . cholecalciferol  2,000 Units Oral Daily  . darbepoetin (ARANESP) injection - DIALYSIS  200 mcg Intravenous Q Boykin Peek Office: [phone] 06/07/2019

## 2019-06-07 NOTE — Plan of Care (Signed)
  Problem: Education: Goal: Knowledge of General Education information will improve Description: Including pain rating scale, medication(s)/side effects and non-pharmacologic comfort measures Outcome: Progressing   Problem: Clinical Measurements: Goal: Respiratory complications will improve Outcome: Progressing   Problem: Coping: Goal: Level of anxiety will decrease Outcome: Progressing   Problem: Education: Goal: Knowledge of disease and its progression will improve Outcome: Progressing   Problem: Fluid Volume: Goal: Compliance with measures to maintain balanced fluid volume will improve Outcome: Progressing   Problem: Clinical Measurements: Goal: Complications related to the disease process, condition or treatment will be avoided or minimized Outcome: Progressing

## 2019-06-07 NOTE — H&P (View-Only) (Signed)
   VASCULAR SURGERY ASSESSMENT & PLAN:   END-STAGE RENAL DISEASE: The patient infiltrated his left upper arm fistula.  This will need to rest.  He is scheduled for placement of a tunneled dialysis catheter tomorrow a.m.  I have discussed this with the patient and he is agreeable to proceed.  ANEMIA: Hemoglobin is 7.2.  May need transfusion.  We will defer to primary service.  SUBJECTIVE:   No complaints.  PHYSICAL EXAM:   Vitals:   06/06/19 1230 06/06/19 1610 06/06/19 1951 06/07/19 0441  BP: 140/75 (!) 141/69 (!) 150/70 (!) 142/78  Pulse: 67 63 67 68  Resp: 18 18 18 18   Temp: 98 F (36.7 C) 99.3 F (37.4 C) 98.8 F (37.1 C) 98.6 F (37 C)  TempSrc: Oral Oral Oral Oral  SpO2: 97% 98% 100% 96%  Weight:      Height:       His left upper arm fistula still has a good thrill.  LABS:   Lab Results  Component Value Date   WBC 9.3 06/07/2019   HGB 7.2 (L) 06/07/2019   HCT 22.2 (L) 06/07/2019   MCV 91.4 06/07/2019   PLT 158 06/07/2019   Potassium is 4.5 today   PROBLEM LIST:    Active Problems:   Volume overload   Metabolic acidosis   Hyperkalemia   CURRENT MEDS:   . sodium chloride   Intravenous Once  . calcitRIOL  0.25 mcg Oral Daily  . cholecalciferol  2,000 Units Oral Daily  . darbepoetin (ARANESP) injection - DIALYSIS  200 mcg Intravenous Q Boykin Peek Office: [phone] 06/07/2019

## 2019-06-08 ENCOUNTER — Encounter (HOSPITAL_COMMUNITY): Payer: Self-pay | Admitting: Surgery

## 2019-06-08 ENCOUNTER — Inpatient Hospital Stay (HOSPITAL_COMMUNITY): Payer: Medicare HMO | Admitting: Anesthesiology

## 2019-06-08 ENCOUNTER — Inpatient Hospital Stay (HOSPITAL_COMMUNITY): Payer: Medicare HMO

## 2019-06-08 ENCOUNTER — Encounter (HOSPITAL_COMMUNITY)
Admission: EM | Disposition: A | Discharge status: Home / Self Care | Payer: Self-pay | Source: Home / Self Care | Attending: Family Medicine

## 2019-06-08 DIAGNOSIS — R300 Dysuria: Secondary | ICD-10-CM | POA: Diagnosis not present

## 2019-06-08 DIAGNOSIS — N186 End stage renal disease: Secondary | ICD-10-CM | POA: Diagnosis not present

## 2019-06-08 DIAGNOSIS — E875 Hyperkalemia: Secondary | ICD-10-CM | POA: Diagnosis not present

## 2019-06-08 DIAGNOSIS — I10 Essential (primary) hypertension: Secondary | ICD-10-CM | POA: Diagnosis not present

## 2019-06-08 DIAGNOSIS — N179 Acute kidney failure, unspecified: Secondary | ICD-10-CM | POA: Diagnosis not present

## 2019-06-08 HISTORY — PX: INSERTION OF DIALYSIS CATHETER: SHX1324

## 2019-06-08 LAB — CBC
HCT: 22 % — ABNORMAL LOW (ref 39.0–52.0)
Hemoglobin: 7 g/dL — ABNORMAL LOW (ref 13.0–17.0)
MCH: 29.4 pg (ref 26.0–34.0)
MCHC: 31.8 g/dL (ref 30.0–36.0)
MCV: 92.4 fL (ref 80.0–100.0)
Platelets: 157 10*3/uL (ref 150–400)
RBC: 2.38 MIL/uL — ABNORMAL LOW (ref 4.22–5.81)
RDW: 13.8 % (ref 11.5–15.5)
WBC: 8.7 10*3/uL (ref 4.0–10.5)
nRBC: 1.8 % — ABNORMAL HIGH (ref 0.0–0.2)

## 2019-06-08 LAB — RENAL FUNCTION PANEL
Albumin: 2.7 g/dL — ABNORMAL LOW (ref 3.5–5.0)
Anion gap: 13 (ref 5–15)
BUN: 92 mg/dL — ABNORMAL HIGH (ref 8–23)
CO2: 18 mmol/L — ABNORMAL LOW (ref 22–32)
Calcium: 7.9 mg/dL — ABNORMAL LOW (ref 8.9–10.3)
Chloride: 108 mmol/L (ref 98–111)
Creatinine, Ser: 11.13 mg/dL — ABNORMAL HIGH (ref 0.61–1.24)
GFR calc Af Amer: 5 mL/min — ABNORMAL LOW (ref >60)
GFR calc non Af Amer: 4 mL/min — ABNORMAL LOW (ref >60)
Glucose, Bld: 98 mg/dL (ref 70–99)
Phosphorus: 6.2 mg/dL — ABNORMAL HIGH (ref 2.5–4.6)
Potassium: 4.9 mmol/L (ref 3.5–5.1)
Sodium: 139 mmol/L (ref 135–145)

## 2019-06-08 LAB — GLUCOSE, CAPILLARY
Glucose-Capillary: 85 mg/dL (ref 70–99)
Glucose-Capillary: 101 mg/dL — ABNORMAL HIGH (ref 70–99)

## 2019-06-08 LAB — SURGICAL PCR SCREEN
MRSA, PCR: NEGATIVE
Staphylococcus aureus: POSITIVE — AB

## 2019-06-08 SURGERY — INSERTION OF DIALYSIS CATHETER
Anesthesia: Monitor Anesthesia Care | Site: Chest | Laterality: Right

## 2019-06-08 MED ORDER — HEPARIN SODIUM (PORCINE) 1000 UNIT/ML IJ SOLN
INTRAMUSCULAR | Status: AC
  Filled 2019-06-08: qty 1

## 2019-06-08 MED ORDER — FENTANYL CITRATE (PF) 250 MCG/5ML IJ SOLN
INTRAMUSCULAR | Status: AC
  Filled 2019-06-08: qty 5

## 2019-06-08 MED ORDER — MIDAZOLAM HCL 5 MG/5ML IJ SOLN
INTRAMUSCULAR | Status: DC | PRN
  Administered 2019-06-08: 2 mg via INTRAVENOUS

## 2019-06-08 MED ORDER — HEPARIN SODIUM (PORCINE) 1000 UNIT/ML DIALYSIS: 1000.0000 [IU] | INTRAMUSCULAR | Status: DC | PRN

## 2019-06-08 MED ORDER — SODIUM CHLORIDE 0.9 % IV SOLN: 100.0000 mL | INTRAVENOUS | Status: DC | PRN

## 2019-06-08 MED ORDER — ACETAMINOPHEN 500 MG PO TABS
ORAL_TABLET | ORAL | Status: AC
  Administered 2019-06-08: 17:00:00
  Filled 2019-06-08: qty 2

## 2019-06-08 MED ORDER — OXYCODONE-ACETAMINOPHEN 5-325 MG PO TABS
1.0000 | ORAL_TABLET | ORAL | Status: DC | PRN
  Administered 2019-06-09: 2 via ORAL
  Filled 2019-06-08: qty 2

## 2019-06-08 MED ORDER — PROPOFOL 10 MG/ML IV BOLUS
INTRAVENOUS | Status: AC
  Filled 2019-06-08: qty 20

## 2019-06-08 MED ORDER — 0.9 % SODIUM CHLORIDE (POUR BTL) OPTIME
TOPICAL | Status: DC | PRN
  Administered 2019-06-08: 1000 mL

## 2019-06-08 MED ORDER — SODIUM CHLORIDE 0.9 % IV SOLN
INTRAVENOUS | Status: AC
  Filled 2019-06-08: qty 1.2

## 2019-06-08 MED ORDER — PROTAMINE SULFATE 10 MG/ML IV SOLN
INTRAVENOUS | Status: AC
  Filled 2019-06-08: qty 25

## 2019-06-08 MED ORDER — PROPOFOL 500 MG/50ML IV EMUL
INTRAVENOUS | Status: DC | PRN
  Administered 2019-06-08: 50 ug/kg/min via INTRAVENOUS

## 2019-06-08 MED ORDER — SODIUM CHLORIDE 0.9 % IV SOLN
INTRAVENOUS | Status: DC | PRN
  Administered 2019-06-08: 10:00:00

## 2019-06-08 MED ORDER — SODIUM CHLORIDE 0.9 % IV SOLN
INTRAVENOUS | Status: DC | PRN
  Administered 2019-06-08: 08:00:00 via INTRAVENOUS

## 2019-06-08 MED ORDER — ALTEPLASE 2 MG IJ SOLR: 2.0000 mg | Freq: Once | INTRAMUSCULAR | Status: DC | PRN

## 2019-06-08 MED ORDER — FENTANYL CITRATE (PF) 100 MCG/2ML IJ SOLN
INTRAMUSCULAR | Status: DC | PRN
  Administered 2019-06-08: 25 ug via INTRAVENOUS

## 2019-06-08 MED ORDER — FENTANYL CITRATE (PF) 100 MCG/2ML IJ SOLN: 25.0000 ug | INTRAMUSCULAR | Status: DC | PRN

## 2019-06-08 MED ORDER — LIDOCAINE-EPINEPHRINE (PF) 1 %-1:200000 IJ SOLN
INTRAMUSCULAR | Status: DC | PRN
  Administered 2019-06-08: 30 mL

## 2019-06-08 MED ORDER — HEPARIN SODIUM (PORCINE) 1000 UNIT/ML IJ SOLN
INTRAMUSCULAR | Status: DC | PRN
  Administered 2019-06-08: 3400 [IU] via INTRAVENOUS

## 2019-06-08 MED ORDER — ACETAMINOPHEN 500 MG PO TABS
1000.0000 mg | ORAL_TABLET | Freq: Once | ORAL | Status: AC
  Administered 2019-06-08: 1000 mg via ORAL

## 2019-06-08 MED ORDER — MIDAZOLAM HCL 2 MG/2ML IJ SOLN
INTRAMUSCULAR | Status: AC
  Filled 2019-06-08: qty 2

## 2019-06-08 SURGICAL SUPPLY — 41 items
ADH SKN CLS APL DERMABOND .7 (GAUZE/BANDAGES/DRESSINGS) ×1
APL PRP STRL LF DISP 70% ISPRP (MISCELLANEOUS) ×1
BAG DECANTER FOR FLEXI CONT (MISCELLANEOUS) ×2 IMPLANT
BIOPATCH RED 1 DISK 7.0 (GAUZE/BANDAGES/DRESSINGS) ×2 IMPLANT
CATH PALINDROME RT-P 15FX19CM (CATHETERS) IMPLANT
CATH PALINDROME RT-P 15FX23CM (CATHETERS) ×1 IMPLANT
CATH PALINDROME RT-P 15FX28CM (CATHETERS) IMPLANT
CATH PALINDROME RT-P 15FX55CM (CATHETERS) IMPLANT
CHLORAPREP W/TINT 26 (MISCELLANEOUS) ×2 IMPLANT
COVER PROBE W GEL 5X96 (DRAPES) IMPLANT
COVER SURGICAL LIGHT HANDLE (MISCELLANEOUS) ×2 IMPLANT
COVER WAND RF STERILE (DRAPES) ×1 IMPLANT
DERMABOND ADVANCED (GAUZE/BANDAGES/DRESSINGS) ×1
DERMABOND ADVANCED .7 DNX12 (GAUZE/BANDAGES/DRESSINGS) IMPLANT
DRAPE C-ARM 42X72 X-RAY (DRAPES) ×2 IMPLANT
DRAPE CHEST BREAST 15X10 FENES (DRAPES) ×2 IMPLANT
GAUZE 4X4 16PLY RFD (DISPOSABLE) ×2 IMPLANT
GLOVE BIO SURGEON STRL SZ7.5 (GLOVE) ×2 IMPLANT
GLOVE BIOGEL PI IND STRL 8 (GLOVE) ×1 IMPLANT
GLOVE BIOGEL PI INDICATOR 8 (GLOVE) ×1
GOWN STRL REUS W/ TWL LRG LVL3 (GOWN DISPOSABLE) ×2 IMPLANT
GOWN STRL REUS W/TWL LRG LVL3 (GOWN DISPOSABLE) ×4
KIT BASIN OR (CUSTOM PROCEDURE TRAY) ×2 IMPLANT
KIT TURNOVER KIT B (KITS) ×2 IMPLANT
NDL 18GX1X1/2 (RX/OR ONLY) (NEEDLE) ×1 IMPLANT
NDL HYPO 25GX1X1/2 BEV (NEEDLE) ×1 IMPLANT
NEEDLE 18GX1X1/2 (RX/OR ONLY) (NEEDLE) ×2 IMPLANT
NEEDLE HYPO 25GX1X1/2 BEV (NEEDLE) ×2 IMPLANT
NS IRRIG 1000ML POUR BTL (IV SOLUTION) ×2 IMPLANT
PACK SURGICAL SETUP 50X90 (CUSTOM PROCEDURE TRAY) ×2 IMPLANT
PAD ARMBOARD 7.5X6 YLW CONV (MISCELLANEOUS) ×4 IMPLANT
SET MICROPUNCTURE 5F STIFF (MISCELLANEOUS) ×1 IMPLANT
SUT ETHILON 3 0 PS 1 (SUTURE) ×2 IMPLANT
SUT VICRYL 4-0 PS2 18IN ABS (SUTURE) ×2 IMPLANT
SYR 10ML LL (SYRINGE) ×2 IMPLANT
SYR 20ML LL LF (SYRINGE) ×4 IMPLANT
SYR 5ML LL (SYRINGE) ×4 IMPLANT
SYR CONTROL 10ML LL (SYRINGE) ×2 IMPLANT
TOWEL GREEN STERILE (TOWEL DISPOSABLE) ×4 IMPLANT
TOWEL GREEN STERILE FF (TOWEL DISPOSABLE) ×2 IMPLANT
WATER STERILE IRR 1000ML POUR (IV SOLUTION) ×2 IMPLANT

## 2019-06-08 NOTE — Plan of Care (Signed)
  Problem: Clinical Measurements: Goal: Ability to maintain clinical measurements within normal limits will improve Outcome: Progressing   Problem: Clinical Measurements: Goal: Diagnostic test results will improve Outcome: Progressing   Problem: Education: Goal: Knowledge of disease and its progression will improve Outcome: Progressing   Problem: Clinical Measurements: Goal: Complications related to the disease process, condition or treatment will be avoided or minimized Outcome: Progressing

## 2019-06-08 NOTE — Interval H&P Note (Signed)
History and Physical Interval Note:  06/08/2019 8:46 AM  Ryan Wells  has presented today for surgery, with the diagnosis of END STAGE RENAL DISEASE.  The various methods of treatment have been discussed with the patient and family. After consideration of risks, benefits and other options for treatment, the patient has consented to  Procedure(s): INSERTION OF DIALYSIS CATHETER (N/A) as a surgical intervention.  The patient's history has been reviewed, patient examined, no change in status, stable for surgery.  I have reviewed the patient's chart and labs.  Questions were answered to the patient's satisfaction.     [name]

## 2019-06-08 NOTE — TOC Initial Note (Signed)
Transition of Care [facility]) - Initial/Assessment Note    Patient Details  Name: Vernel Lewan MRN: [id number] Date of Birth: 06-Aug-1956  Transition of [facility]) CM/SW Contact:    Bartholomew Crews, RN Phone Number: [phone] 06/08/2019, 3:13 PM  Clinical Narrative:                 Patient from home. AVF placed 07/2018 - not functioning well. TDC catheter placed today followed by HD today. Advised by renal navigator that he has been clipped to [facility] TTS. TOC following for transition needs.   Expected Discharge Plan: Home/Self Care Barriers to Discharge: Continued Medical Work up   Patient Goals and CMS Choice        Expected Discharge Plan and Services Expected Discharge Plan: Home/Self Care                                              Prior Living Arrangements/Services   Lives with:: Self                   Activities of Daily Living   ADL Screening (condition at time of admission) Is the patient deaf or have difficulty hearing?: No Does the patient have difficulty seeing, even when wearing glasses/contacts?: No Does the patient have difficulty concentrating, remembering, or making decisions?: No Does the patient have difficulty dressing or bathing?: No Does the patient have difficulty walking or climbing stairs?: No  Permission Sought/Granted                  Emotional Assessment              Admission diagnosis:  Hyperkalemia [id number] Metabolic acidosis [id number] Abnormal CXR [R93.89] Anemia due to chronic kidney disease, unspecified CKD stage [N18.9, D63.1] Patient Active Problem List   Diagnosis Date Noted  . Volume overload 06/03/2019  . Metabolic acidosis   . Hyperkalemia    PCP:  Mack Hook, MD Pharmacy:   [facility] [phone] [address], [place] [address] [place] [address] Phone: [phone] Fax: [phone]     Social  Determinants of Health (SDOH) Interventions    Readmission Risk Interventions No flowsheet data found.

## 2019-06-08 NOTE — Anesthesia Preprocedure Evaluation (Addendum)
Anesthesia Evaluation  Patient identified by MRN, date of birth, ID band Patient awake    Reviewed: Allergy & Precautions, NPO status , Patient's Chart, lab work & pertinent test results  Airway Mallampati: I  TM Distance: >3 FB Neck ROM: Full    Dental no notable dental hx. (+) Missing, Dental Advisory Given,    Pulmonary neg pulmonary ROS,    Pulmonary exam normal breath sounds clear to auscultation       Cardiovascular hypertension, negative cardio ROS Normal cardiovascular exam Rhythm:Regular Rate:Normal     Neuro/Psych negative neurological ROS  negative psych ROS   GI/Hepatic negative GI ROS, Neg liver ROS,   Endo/Other  negative endocrine ROSdiabetes  Renal/GU ESRFRenal disease (K 4.9, Cr 11.13)  negative genitourinary   Musculoskeletal negative musculoskeletal ROS (+)   Abdominal   Peds  Hematology  (+) Blood dyscrasia (Hgb 7.0), anemia ,   Anesthesia Other Findings   Reproductive/Obstetrics                            Anesthesia Physical Anesthesia Plan  ASA: III  Anesthesia Plan: MAC   Post-op Pain Management:    Induction: Intravenous  PONV Risk Score and Plan: Propofol infusion and Treatment may vary due to age or medical condition  Airway Management Planned: Natural Airway  Additional Equipment:   Intra-op Plan:   Post-operative Plan:   Informed Consent: I have reviewed the patients History and Physical, chart, labs and discussed the procedure including the risks, benefits and alternatives for the proposed anesthesia with the patient or authorized representative who has indicated his/her understanding and acceptance.     Dental advisory given  Plan Discussed with: CRNA  Anesthesia Plan Comments:         Anesthesia Quick Evaluation

## 2019-06-08 NOTE — Transfer of Care (Signed)
Immediate Anesthesia Transfer of Care Note  Patient: Ryan Wells  Procedure(s) Performed: INSERTION OF PALINDROME DIALYSIS CATHETER IN RIGHT INTERNAL JUGULAR (Right Chest)  Patient Location: PACU  Anesthesia Type:MAC  Level of Consciousness: awake, alert  and oriented  Airway & Oxygen Therapy: Patient Spontanous Breathing and Patient connected to face mask oxygen  Post-op Assessment: Report given to RN, Post -op Vital signs reviewed and stable and Patient moving all extremities X 4  Post vital signs: Reviewed and stable  Last Vitals:  Vitals Value Taken Time  BP 145/69 06/08/19 1007  Temp    Pulse 85 06/08/19 1007  Resp 18 06/08/19 1007  SpO2 100 % 06/08/19 1007  Vitals shown include unvalidated device data.  Last Pain:  Vitals:   06/08/19 0747  TempSrc:   PainSc: 0-No pain         Complications: No apparent anesthesia complications

## 2019-06-08 NOTE — Anesthesia Procedure Notes (Signed)
Procedure Name: MAC Date/Time: 06/08/2019 9:38 AM Performed by: Neldon Newport, CRNA Pre-anesthesia Checklist: Patient identified, Emergency Drugs available, Suction available, Patient being monitored and Timeout performed Patient Re-evaluated:Patient Re-evaluated prior to induction Oxygen Delivery Method: Simple face mask Placement Confirmation: positive ETCO2

## 2019-06-08 NOTE — Anesthesia Postprocedure Evaluation (Signed)
Anesthesia Post Note  Patient: Eliazer Hemphill  Procedure(s) Performed: INSERTION OF PALINDROME DIALYSIS CATHETER IN RIGHT INTERNAL JUGULAR (Right Chest)     Patient location during evaluation: PACU Anesthesia Type: MAC Level of consciousness: awake and alert Pain management: pain level controlled Vital Signs Assessment: post-procedure vital signs reviewed and stable Respiratory status: spontaneous breathing, nonlabored ventilation, respiratory function stable and patient connected to nasal cannula oxygen Cardiovascular status: stable and blood pressure returned to baseline Postop Assessment: no apparent nausea or vomiting Anesthetic complications: no    Last Vitals:  Vitals:   06/08/19 1600 06/08/19 1624  BP: (!) 161/80 (!) 158/76  Pulse: 68 70  Resp:  (!) 22  Temp:  37.5 C  SpO2:  97%    Last Pain:  Vitals:   06/08/19 1624  TempSrc: Oral  PainSc:                  Torrion Witter L Romyn Boswell

## 2019-06-08 NOTE — Op Note (Signed)
06/08/2019  PREOP DIAGNOSIS: Chronic kidney disease  POSTOP DIAGNOSIS: Chronic kidney disease  PROCEDURE: Ultrasound guided placement of right IJ tunneled dialysis catheter  (23 cm)  SURGEON: Judeth Cornfield. Scot Dock, MD, FACS  ASSIST: none  ANESTHESIA: local with sedation   EBL: minimal  FINDINGS: patent right IJ  INDICATIONS: for HD  TECHNIQUE: The patient was taken to the operating room and sedated by anesthesia. The neck and upper chest were prepped and draped in the usual sterile fashion. After the skin was anesthetized with 1% lidocaine, and under ultrasound guidance, the right IJ was cannulated and a guidewire introduced into the superior vena cava under fluoroscopic control. The tract over the wire was dilated and then the dilator and peel-away sheath were passed over the wire and the wire and dilator removed. The catheter was passed through the peel-away sheath and positioned in the right atrium. The exit site for the catheter was selected and the skin anesthetized between the 2 areas. The catheter was then brought through the tunnel, cut to the appropriate length, and the distal ports were attached. Both ports withdrew easily, were then flushed with heparinized saline and filled with concentrated heparin. The catheter was secured at its exit site with a 3-0 nylon suture. The IJ cannulation site was closed with a 4-0 subcuticular stitch. A sterile dressing was applied. The patient tolerated the procedure well and was transferred to the recovery room in stable condition. All needle and sponge counts were correct.  [name], MD, FACS [facility]: 06/08/2019 DATE OF DICTATION: 06/08/2019

## 2019-06-08 NOTE — Progress Notes (Signed)
PROGRESS NOTE    Ryan Wells  [id number] DOB: 1956/07/14 DOA: 06/03/2019 PCP: Mack Hook, MD      Brief Narrative:  [name] is a 63 y.o. 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 Joelyn Oms who presented with progressive SOB with exertion, malaise, decreased UOP and now swelling over the last few days to weeks.  In the ER, Cr 12.8, K 6.2, Hgb 6.5 and CXR with focal opacity, no effusions.  Nephrology were consulted and recommended initiating dialysis.       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  Found to have progression of his renal failure, now acidotic, overloaded and hyperkalemic.    10/22 attempted HD but couldn't access fistula 10/23 Fistulogram 10/23 with angioplasty successful, first HD 10/23 morning.   10/24 Subsequently fistula clotted off again. 10/27 TDC this morning, HD this afternoon  -Consult Nephrology and Vascular surgery, appreciate cares   Hyperkalemia Resolved -Repeat Bmet  Metabolic acidosis Bicarb trending down - Trend RFP  Anemia of chronic renal disease Transfused 1 unit 10/25 Asymptomatic.  Hgb 7 today, stable. -Transfusion threshold 6 g/dL or symptoms -Hope to avoid transfusion, given future transplant listing   -Aranesp  Metabolic bone disease -Vit D analogs/supplement per Nephrology  Diabetes Chart history.  Not on meds, and A1c is 5%.  Hypertension BP still elevated -Continue new amlodipine  Eosinophilia  Incidentally noted on smear.  Significance unclear.  Abnormal chest x-ray Focal opacity, no developing symptoms of pneumonia.  Likely edema. -Repeat chest x-ray in 4 weeks  Dysuria Urine culture no growth, likely this was from penis edema.       MDM and disposition: The below labs and imaging reports reviewed and summarized above.  Medication management as above.   The patient was admitted with acute on chronic renal failure, acidosis and hyperkalemia  He will need serial dialysis and placement to an outpatient center.  Still severely fluid overloaded.  Now has TDC, will need serial HD and then to home when Clip'd and swelilng resolved.       DVT prophylaxis: SCDs Code Status: FULL Family Communication:      Consultants:   Nephrology  Vascular surgery  Procedures:   10/23 angioplasty of AV fistula and coil embo of competing branch vessel by Dr. Scot Dock  10/23 first HD session  10/27 [facility]  10/27 2nd HD        Subjective: All history collected through video phonic interpreter.  No dyspnea, nausea, chest pain, confusion, fever, orthopnea.  He still swollen, but this is better at the end of his dialysis session today.      Objective: Vitals:   06/08/19 1530 06/08/19 1600 06/08/19 1624 06/08/19 1719  BP: (!) 154/77 (!) 161/80 (!) 158/76 (!) 149/67  Pulse: 67 68 70 67  Resp:   (!) 22 18  Temp:   99.5 F (37.5 C) 98.7 F (37.1 C)  TempSrc:   Oral Oral  SpO2:   97% 98%  Weight:   74.7 kg   Height:        Intake/Output Summary (Last 24 hours) at 06/08/2019 1920 Last data filed at 06/08/2019 1600 Gross per 24 hour  Intake 207.55 ml  Output 3020 ml  Net -2812.45 ml   Filed Weights   06/04/19 1105 06/04/19 1341 06/08/19 1624  Weight: 81.3 kg 79.5  kg 74.7 kg    Examination: General appearance: Male, lying in bed, no acute distress, interactive.  Seen on dialysis.    HEENT: Icteric, conjunctival pink, lids and lashes normal.  No nasal deformity, discharge, or epistaxis.  Lips moist, dentition poor, oropharynx moist, no oral lesions. Skin:   Cardiac: RRR, no murmurs, 1+ lower extremity edema, radial pulses normal. Respiratory: Elder Love effort normal, lung sounds equal bilaterally, no rales or wheezes. Abdomen: Abdomen soft without tenderness palpation or guarding. MSK: Normal muscle bulk and tone Neuro: Awake and  alert, extraocular movements intact, moves all extremities normal strength and coordination, speech fluent. Psych: Responding to questions, attention normal, affect normal, judgment insight appeared normal.    Data Reviewed: I have personally reviewed following labs and imaging studies:  CBC: Recent Labs  Lab 06/03/19 1230  06/05/19 0541 06/05/19 1645 06/06/19 0519 06/07/19 0741 06/08/19 0727  WBC 6.6   < > 5.7 6.5 7.7 9.3 8.7  NEUTROABS 3.3  --   --   --   --   --   --   HGB 6.5*   < > 6.0* 5.8* 5.6* 7.2* 7.0*  HCT 20.7*   < > 18.4* 17.9* 17.9* 22.2* 22.0*  MCV 92.0   < > 88.5 88.6 90.9 91.4 92.4  PLT 155   < > 144* 132* 138* 158 157   < > = values in this interval not displayed.   Basic Metabolic Panel: Recent Labs  Lab 06/04/19 0559 06/04/19 1838 06/05/19 0541 06/06/19 0519 06/07/19 0741 06/08/19 0727  NA 138 139 140 139 138 139  K 6.6* 4.7 4.7 4.5 4.5 4.9  CL 111 106 107 107 107 108  CO2 13* 18* 20* 20* 19* 18*  GLUCOSE 64* 120* 95 133* 105* 98  BUN 129* 78* 84* 84* 89* 92*  CREATININE 13.14* 8.68* 9.36* 9.87* 10.76* 11.13*  CALCIUM 7.9* 8.1* 7.9* 7.7* 7.8* 7.9*  PHOS 9.0*  --  6.9* 6.7* 6.4* 6.2*   GFR: Estimated Creatinine Clearance: 5.9 mL/min (A) (by C-G formula based on SCr of 11.13 mg/dL (H)). Liver Function Tests: Recent Labs  Lab 06/03/19 1230 06/04/19 0559 06/05/19 0541 06/06/19 0519 06/07/19 0741 06/08/19 0727  AST 13* 11*  --   --   --   --   ALT 13 11  --   --   --   --   ALKPHOS 136* 124  --   --   --   --   BILITOT 0.7 0.5  --   --   --   --   PROT 6.2* 5.7*  --   --   --   --   ALBUMIN 3.0* 2.8* 2.6* 2.6* 2.7* 2.7*   Recent Labs  Lab 06/03/19 1230  LIPASE 37   No results for input(s): AMMONIA in the last 168 hours. Coagulation Profile: No results for input(s): INR, PROTIME in the last 168 hours. Cardiac Enzymes: No results for input(s): CKTOTAL, CKMB, CKMBINDEX, TROPONINI in the last 168 hours. BNP (last 3 results) No results  for input(s): PROBNP in the last 8760 hours. HbA1C: No results for input(s): HGBA1C in the last 72 hours. CBG: Recent Labs  Lab 06/08/19 0906 06/08/19 1009  GLUCAP 85 101*   Lipid Profile: No results for input(s): CHOL, HDL, LDLCALC, TRIG, CHOLHDL, LDLDIRECT in the last 72 hours. Thyroid Function Tests: No results for input(s): TSH, T4TOTAL, FREET4, T3FREE, THYROIDAB in the last 72 hours. Anemia Panel: No results for input(s): VITAMINB12, FOLATE, FERRITIN, TIBC,  IRON, RETICCTPCT in the last 72 hours. Urine analysis:    Component Value Date/Time   COLORURINE YELLOW 06/06/2019 [place] 06/06/2019 1530   LABSPEC 1.014 06/06/2019 1530   PHURINE 6.0 06/06/2019 1530   GLUCOSEU 150 (A) 06/06/2019 1530   HGBUR SMALL (A) 06/06/2019 1530   BILIRUBINUR NEGATIVE 06/06/2019 [place] 06/06/2019 1530   PROTEINUR >=300 (A) 06/06/2019 1530   NITRITE NEGATIVE 06/06/2019 1530   LEUKOCYTESUR NEGATIVE 06/06/2019 1530   Sepsis Labs: @LABRCNTIP (procalcitonin:4,lacticacidven:4)  ) Recent Results (from the past 240 hour(s))  SARS CORONAVIRUS 2 (TAT 6-24 HRS) Nasopharyngeal Nasopharyngeal Swab     Status: None   Collection Time: 06/03/19  1:32 PM   Specimen: Nasopharyngeal Swab  Result Value Ref Range Status   SARS Coronavirus 2 NEGATIVE NEGATIVE Final    Comment: (NOTE) SARS-CoV-2 target nucleic acids are NOT DETECTED. The SARS-CoV-2 RNA is generally detectable in upper and lower respiratory specimens during the acute phase of infection. Negative results do not preclude SARS-CoV-2 infection, do not rule out co-infections with other pathogens, and should not be used as the sole basis for treatment or other patient management decisions. Negative results must be combined with clinical observations, patient history, and epidemiological information. The expected result is Negative. Fact Sheet for Patients: SugarRoll.be Fact Sheet  for Healthcare Providers: https://www.woods-mathews.com/ This test is not yet approved or cleared by the [place] FDA and  has been authorized for detection and/or diagnosis of SARS-CoV-2 by FDA under an Emergency Use Authorization (EUA). This EUA will remain  in effect (meaning this test can be used) for the duration of the COVID-19 declaration under Section 56 4(b)(1) of the Act, 21 U.S.C. section 360bbb-3(b)(1), unless the authorization is terminated or revoked sooner. Performed at [facility], [place] [address] [place], [place] [address]   Surgical pcr screen     Status: Abnormal   Collection Time: 06/04/19  7:32 AM   Specimen: Nasal Mucosa; Nasal Swab  Result Value Ref Range Status   MRSA, PCR NEGATIVE NEGATIVE Final   Staphylococcus aureus POSITIVE (A) NEGATIVE Final    Comment: (NOTE) The Xpert SA Assay (FDA approved for NASAL specimens in patients 47 years of age and older), is one component of a comprehensive surveillance program. It is not intended to diagnose infection nor to guide or monitor treatment. Performed at [facility], [place] [address], [place], [place] [address]   Culture, Urine     Status: None   Collection Time: 06/06/19  3:17 PM   Specimen: Urine, Random  Result Value Ref Range Status   Specimen Description URINE, RANDOM  Final   Special Requests NONE  Final   Culture   Final    NO GROWTH Performed at [facility], [place] [address] [place], [place] [address]    Report Status 06/07/2019 FINAL  Final  Surgical pcr screen     Status: Abnormal   Collection Time: 06/08/19  1:12 AM   Specimen: Nasal Mucosa; Nasal Swab  Result Value Ref Range Status   MRSA, PCR NEGATIVE NEGATIVE Final   Staphylococcus aureus POSITIVE (A) NEGATIVE Final    Comment: (NOTE) The Xpert SA Assay (FDA approved for NASAL specimens in patients 51 years of age and older), is one component of a comprehensive surveillance program. It is not intended  to diagnose infection nor to guide or monitor treatment. Performed at [facility], [place] [address], [place], [place] [address]  Radiology Studies: Dg Chest Port 1 View  Result Date: 06/08/2019 CLINICAL DATA:  Central line placement EXAM: PORTABLE CHEST 1 VIEW COMPARISON:  06/04/2019 FINDINGS: Interval placement of dual lumen right internal jugular approach central venous catheter with distal tip terminating at the level of the right atrium. Cardiac silhouette remains enlarged, stable. Pulmonary vasculature is nondilated. Masslike density within the right mid lung with blunting of the right costophrenic angle suggesting loculated pleural effusion, not significantly changed from prior. Left lung is clear. No pneumothorax. IMPRESSION: 1. Interval placement of right IJ central venous catheter. No pneumothorax. 2. Similar appearance of masslike density in the right mid lung, favored to represent loculated pleural fluid within the minor fissure. CT chest could be performed for confirmation. Electronically Signed   By: Davina Poke M.D.   On: 06/08/2019 10:34   Dg Fluoro Guide Cv Line-no Report  Result Date: 06/08/2019 Fluoroscopy was utilized by the requesting physician.  No radiographic interpretation.        Scheduled Meds: . sodium chloride   Intravenous Once  . amLODipine  5 mg Oral Daily  . calcitRIOL  0.25 mcg Oral Daily  . Chlorhexidine Gluconate Cloth  6 each Topical Q0600  . cholecalciferol  2,000 Units Oral Daily  . darbepoetin (ARANESP) injection - DIALYSIS  200 mcg Intravenous Q Fri-HD  . heparin       Continuous Infusions:    LOS: 5 days    Time spent: 25 minutes  Edwin Dada, MD [facility] 06/08/2019, 7:20 PM     Please page through [place]:  [email or web address] Password [email or web address] If 7PM-7AM, please contact night-coverage

## 2019-06-08 NOTE — Progress Notes (Signed)
[facility] ROUNDING NOTE   Subjective:   This is a 63 year old gentleman diabetes hypertension chronic kidney disease followed by Dr. Joelyn Oms now on dialysis.  Was admitted from the emergency room with a creatinine of 12.8 potassium 6.2.  He had an AV fistula but difficulty assessing AV fistula on Thursday, 06/03/2019.  He underwent fistulogram and angioplasty 06/04/2019.  He underwent placement of right IJ catheter by Dr. Scot Dock 06/08/2019.  He is scheduled for dialysis 06/08/2019  Blood pressure 133/62 pulse 75 temperature 98.7 O2 sats 98% room air Sodium 139 potassium 4.9 chloride 108 CO2 18 BUN 92 creatinine level glucose 98 WBC 8.7 hemoglobin 7 platelets 157  Amlodipine 5 mg daily, Calcitrol 0.25 mcg daily, darbepoetin 200 mcg q. Friday,  Objective:  Vital signs in last 24 hours:  Temp:  [97.3 F (36.3 C)-99.8 F (37.7 C)] 98.7 F (37.1 C) (10/27 1057) Pulse Rate:  [69-85] 72 (10/27 1057) Resp:  [16-25] 18 (10/27 1057) BP: (133-155)/(62-78) 133/62 (10/27 1057) SpO2:  [94 %-100 %] 98 % (10/27 1057)  Weight change:  Filed Weights   06/03/19 1430 06/04/19 1105 06/04/19 1341  Weight: 60.8 kg 81.3 kg 79.5 kg    Intake/Output: I/O last 3 completed shifts: In: 240 [P.O.:240] Out: 550 [Urine:550]   Intake/Output this shift:  Total I/O In: 207.6 [I.V.:107.6; IV Piggyback:100] Out: 20 [Blood:20]  General:  AAOx3 NAD HEENT: MMM [place] AT anicteric sclera Neck:  No JVD, no adenopathy CV:  Heart RRR  Lungs:  L/S CTA bilaterally Abd:  abd SNT/ND with normal BS GU:  Bladder non-palpable Extremities: +2 left upper extremity edema with mild erythema, thrill weak. Skin:  No skin rash   Basic Metabolic Panel: Recent Labs  Lab 06/04/19 0559 06/04/19 1838 06/05/19 0541 06/06/19 0519 06/07/19 0741 06/08/19 0727  NA 138 139 140 139 138 139  K 6.6* 4.7 4.7 4.5 4.5 4.9  CL 111 106 107 107 107 108  CO2 13* 18* 20* 20* 19* 18*  GLUCOSE 64* 120* 95 133* 105* 98   BUN 129* 78* 84* 84* 89* 92*  CREATININE 13.14* 8.68* 9.36* 9.87* 10.76* 11.13*  CALCIUM 7.9* 8.1* 7.9* 7.7* 7.8* 7.9*  PHOS 9.0*  --  6.9* 6.7* 6.4* 6.2*    Liver Function Tests: Recent Labs  Lab 06/03/19 1230 06/04/19 0559 06/05/19 0541 06/06/19 0519 06/07/19 0741 06/08/19 0727  AST 13* 11*  --   --   --   --   ALT 13 11  --   --   --   --   ALKPHOS 136* 124  --   --   --   --   BILITOT 0.7 0.5  --   --   --   --   PROT 6.2* 5.7*  --   --   --   --   ALBUMIN 3.0* 2.8* 2.6* 2.6* 2.7* 2.7*   Recent Labs  Lab 06/03/19 1230  LIPASE 37   No results for input(s): AMMONIA in the last 168 hours.  CBC: Recent Labs  Lab 06/03/19 1230  06/05/19 0541 06/05/19 1645 06/06/19 0519 06/07/19 0741 06/08/19 0727  WBC 6.6   < > 5.7 6.5 7.7 9.3 8.7  NEUTROABS 3.3  --   --   --   --   --   --   HGB 6.5*   < > 6.0* 5.8* 5.6* 7.2* 7.0*  HCT 20.7*   < > 18.4* 17.9* 17.9* 22.2* 22.0*  MCV 92.0   < > 88.5  88.6 90.9 91.4 92.4  PLT 155   < > 144* 132* 138* 158 157   < > = values in this interval not displayed.    Cardiac Enzymes: No results for input(s): CKTOTAL, CKMB, CKMBINDEX, TROPONINI in the last 168 hours.  BNP: Invalid input(s): POCBNP  CBG: Recent Labs  Lab 06/08/19 0906 06/08/19 1009  GLUCAP 85 101*    Microbiology: Results for orders placed or performed during the hospital encounter of 06/03/19  SARS CORONAVIRUS 2 (TAT 6-24 HRS) Nasopharyngeal Nasopharyngeal Swab     Status: None   Collection Time: 06/03/19  1:32 PM   Specimen: Nasopharyngeal Swab  Result Value Ref Range Status   SARS Coronavirus 2 NEGATIVE NEGATIVE Final    Comment: (NOTE) SARS-CoV-2 target nucleic acids are NOT DETECTED. The SARS-CoV-2 RNA is generally detectable in upper and lower respiratory specimens during the acute phase of infection. Negative results do not preclude SARS-CoV-2 infection, do not rule out co-infections with other pathogens, and should not be used as the sole basis for  treatment or other patient management decisions. Negative results must be combined with clinical observations, patient history, and epidemiological information. The expected result is Negative. Fact Sheet for Patients: SugarRoll.be Fact Sheet for Healthcare Providers: https://www.woods-mathews.com/ This test is not yet approved or cleared by the [place] FDA and  has been authorized for detection and/or diagnosis of SARS-CoV-2 by FDA under an Emergency Use Authorization (EUA). This EUA will remain  in effect (meaning this test can be used) for the duration of the COVID-19 declaration under Section 56 4(b)(1) of the Act, 21 U.S.C. section 360bbb-3(b)(1), unless the authorization is terminated or revoked sooner. Performed at [facility], [place] [address] [place], [place] [address]   Surgical pcr screen     Status: Abnormal   Collection Time: 06/04/19  7:32 AM   Specimen: Nasal Mucosa; Nasal Swab  Result Value Ref Range Status   MRSA, PCR NEGATIVE NEGATIVE Final   Staphylococcus aureus POSITIVE (A) NEGATIVE Final    Comment: (NOTE) The Xpert SA Assay (FDA approved for NASAL specimens in patients 48 years of age and older), is one component of a comprehensive surveillance program. It is not intended to diagnose infection nor to guide or monitor treatment. Performed at [facility], [place] [address], [place], [place] [address]   Culture, Urine     Status: None   Collection Time: 06/06/19  3:17 PM   Specimen: Urine, Random  Result Value Ref Range Status   Specimen Description URINE, RANDOM  Final   Special Requests NONE  Final   Culture   Final    NO GROWTH Performed at [facility], [place] [address], [place], [place] [address]    Report Status 06/07/2019 FINAL  Final  Surgical pcr screen     Status: Abnormal   Collection Time: 06/08/19  1:12 AM   Specimen: Nasal Mucosa; Nasal Swab  Result Value Ref Range Status    MRSA, PCR NEGATIVE NEGATIVE Final   Staphylococcus aureus POSITIVE (A) NEGATIVE Final    Comment: (NOTE) The Xpert SA Assay (FDA approved for NASAL specimens in patients 31 years of age and older), is one component of a comprehensive surveillance program. It is not intended to diagnose infection nor to guide or monitor treatment. Performed at [facility], [place] [address], [place], [place] [address]     Coagulation Studies: No results for input(s): LABPROT, INR in the last 72 hours.  Urinalysis:  Recent Labs    06/06/19 1530  COLORURINE YELLOW  LABSPEC 1.014  PHURINE 6.0  GLUCOSEU 150*  HGBUR SMALL*  BILIRUBINUR NEGATIVE  KETONESUR NEGATIVE  PROTEINUR >=300*  NITRITE NEGATIVE  LEUKOCYTESUR NEGATIVE      Imaging: Dg Chest Port 1 View  Result Date: 06/08/2019 CLINICAL DATA:  Central line placement EXAM: PORTABLE CHEST 1 VIEW COMPARISON:  06/04/2019 FINDINGS: Interval placement of dual lumen right internal jugular approach central venous catheter with distal tip terminating at the level of the right atrium. Cardiac silhouette remains enlarged, stable. Pulmonary vasculature is nondilated. Masslike density within the right mid lung with blunting of the right costophrenic angle suggesting loculated pleural effusion, not significantly changed from prior. Left lung is clear. No pneumothorax. IMPRESSION: 1. Interval placement of right IJ central venous catheter. No pneumothorax. 2. Similar appearance of masslike density in the right mid lung, favored to represent loculated pleural fluid within the minor fissure. CT chest could be performed for confirmation. Electronically Signed   By: Davina Poke M.D.   On: 06/08/2019 10:34   Dg Fluoro Guide Cv Line-no Report  Result Date: 06/08/2019 Fluoroscopy was utilized by the requesting physician.  No radiographic interpretation.     Medications:    . sodium chloride   Intravenous Once  . acetaminophen  1,000 mg Oral Once  .  amLODipine  5 mg Oral Daily  . calcitRIOL  0.25 mcg Oral Daily  . Chlorhexidine Gluconate Cloth  6 each Topical Q0600  . cholecalciferol  2,000 Units Oral Daily  . darbepoetin (ARANESP) injection - DIALYSIS  200 mcg Intravenous Q Fri-HD   acetaminophen **OR** acetaminophen, ondansetron (ZOFRAN) IV, oxyCODONE-acetaminophen  Assessment/ Plan:  KINNIE LAMBERTI Diazis a 63 y.o.malewith longstanding diabetes mellitus and hypertension as well as CKD followed by Dr. Joelyn Oms at [facility]. He has had progressive CKD with a creatinine over 5 for quite some time.  1.  End-stage renal disease.  Initiated dialysis this admission.  Status post dialysis yesterday today with minimal treatment time.  Will need clip for outpatient dialysis.    Patient resting left upper extremity fistula we will plan dialysis after placement of tunneled dialysis catheter 06/08/2019  2.  Hypertension.  Stable.  3.  Anemia.  Transfuse as needed.  Dropping hemoglobin likely secondary to clotting off dialysis circuits.  4.  Vascular access.  Status post angioplasty today of left upper extremity AV fistula.  Still with difficulty accessing.    Resting AV fistula with plan for tunneled dialysis catheter  5.  Hyperkalemia.  Improved   LOS: 5 Sherril Croon @TODAY @11 :55 AM

## 2019-06-09 ENCOUNTER — Encounter (HOSPITAL_COMMUNITY): Payer: Self-pay | Admitting: Vascular Surgery

## 2019-06-09 DIAGNOSIS — N185 Chronic kidney disease, stage 5: Secondary | ICD-10-CM | POA: Diagnosis not present

## 2019-06-09 DIAGNOSIS — N179 Acute kidney failure, unspecified: Secondary | ICD-10-CM | POA: Diagnosis not present

## 2019-06-09 DIAGNOSIS — E875 Hyperkalemia: Secondary | ICD-10-CM | POA: Diagnosis not present

## 2019-06-09 DIAGNOSIS — I10 Essential (primary) hypertension: Secondary | ICD-10-CM | POA: Diagnosis not present

## 2019-06-09 LAB — RENAL FUNCTION PANEL
Albumin: 2.6 g/dL — ABNORMAL LOW (ref 3.5–5.0)
Anion gap: 9 (ref 5–15)
BUN: 48 mg/dL — ABNORMAL HIGH (ref 8–23)
CO2: 25 mmol/L (ref 22–32)
Calcium: 7.8 mg/dL — ABNORMAL LOW (ref 8.9–10.3)
Chloride: 102 mmol/L (ref 98–111)
Creatinine, Ser: 7.18 mg/dL — ABNORMAL HIGH (ref 0.61–1.24)
GFR calc Af Amer: 8 mL/min — ABNORMAL LOW (ref >60)
GFR calc non Af Amer: 7 mL/min — ABNORMAL LOW (ref >60)
Glucose, Bld: 112 mg/dL — ABNORMAL HIGH (ref 70–99)
Phosphorus: 4.7 mg/dL — ABNORMAL HIGH (ref 2.5–4.6)
Potassium: 4.4 mmol/L (ref 3.5–5.1)
Sodium: 136 mmol/L (ref 135–145)

## 2019-06-09 LAB — CBC
HCT: 22.4 % — ABNORMAL LOW (ref 39.0–52.0)
Hemoglobin: 7.2 g/dL — ABNORMAL LOW (ref 13.0–17.0)
MCH: 29.8 pg (ref 26.0–34.0)
MCHC: 32.1 g/dL (ref 30.0–36.0)
MCV: 92.6 fL (ref 80.0–100.0)
Platelets: 157 10*3/uL (ref 150–400)
RBC: 2.42 MIL/uL — ABNORMAL LOW (ref 4.22–5.81)
RDW: 14.2 % (ref 11.5–15.5)
WBC: 7.6 10*3/uL (ref 4.0–10.5)
nRBC: 2 % — ABNORMAL HIGH (ref 0.0–0.2)

## 2019-06-09 MED ORDER — CHLORHEXIDINE GLUCONATE CLOTH 2 % EX PADS: 6.0000 | MEDICATED_PAD | Freq: Every day | CUTANEOUS | Status: DC

## 2019-06-09 NOTE — Discharge Summary (Signed)
Physician Discharge Summary  Ryan Wells [id number] DOB: [birth year] DOA: 06/03/2019  PCP: Mack Hook, MD  Admit date: 06/03/2019 Discharge date: 06/09/2019  Admitted From: Home  Disposition:  Home   Recommendations for Outpatient Follow-up:  1. Follow up with at [facility] tomorrow for MWF dialysis 2. Follow up with Dr. Amil Amen in 1 week 3. [facility]: Please check a hemoglobin in 1 week 4. Dr. Amil Amen: Please repeat a CXR in 4 weeks, on or around Nov 20     Home Health: None  Equipment/Devices: None  Discharge Condition: Good  CODE STATUS: FULL Diet recommendation: Renal  Brief/Interim Summary: Ryan Wells is a 63 y.o. 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 Joelyn Oms who presented with progressive SOB with exertion, malaise, decreased UOP and now swelling over the last few days to weeks.  In the ER, Cr 12.8, K 6.2, Hgb 6.5 and CXR with focal opacity, no effusions.  Nephrology were consulted and recommended initiating dialysis.      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  Found to have progression of his renal failure, now acidotic, overloaded and hyperkalemic.    10/22 attempted HD but couldn't access fistula 10/23 Fistulogram 10/23 with angioplasty successful, first HD 10/23 morning.   10/24 Subsequently fistula clotted off again. 10/27 TDC placed, HD repeated 10/28 Assigned to OP HD center, Nephrology felt he could follow up for outpatient dialysis   Hyperkalemia Resolved  Metabolic acidosis Resolved with dialysis  Anemia of chronic renal disease Presented with Hgb 6.5 g/dL. Asymptomatic and transfusion was withheld, managed conservatively given expectation for future transplant.  Started on Aranesp and iron.  Hgb  subsequently dropped to 5.6 g/dL, no clinical bleeding noted, but he was transfused 1 unit 10/25, and Hgb was subsequently 7 g/dL and stable.    Metabolic bone disease  Diabetes, ruled out He had chart history diabetes.  Not on meds, and A1c is 5%.  Hypertension Started on amlodipine.  Eosinophilia  Incidentally noted on smear.  Significance unclear.  Abnormal chest x-ray Focal opacity, no developing symptoms of pneumonia.  Likely edema. -Repeat chest x-ray in 4 weeks  Dysuria Urine culture no growth, likely this was from penis edema.                Discharge Instructions  Discharge Instructions    Discharge instructions   Complete by: As directed    From Dr. Loleta Books: You were admitted for worsening of your kidneys, which have now progressed to the point of needing dialysis.  You had a tunneled catheter placed in your right chest, which will be used to perform dialysis until your fistula in your left bicep is ready.  Take the following medicines daily: Amlodipine/Norvasc 5 mg daily (this is a blood pressure medicine) Calcitriol 0.25 mcg  Cholecalciferol/Vitamin D3 25 mcg daily (these last two are vitamin supplements needed in kidney failure)  Follow up with your primary care doctor Dr. Amil Amen at [facility] in 1 week, just for a check in, so that she knows you are on dialysis and can make sure you are doing well Make sure she orders a chest x-ray for you in about 4 weeks (around Nov 20) to follow up your chest x-ray here and make sure it is clear  Follow up with the dialysis center tomorrow as directed    Reasons to call your kidney doctor or come back to the hospital: Swelling, severe pain, or change in color of the skin near your right chest tunneled catheter Fever Dizziness, trouble breathing, passing out   Increase activity slowly   Complete by: As directed      Allergies as of 06/09/2019   No Known Allergies     Medication List     STOP taking these medications   furosemide 20 MG tablet Commonly known as: LASIX     TAKE these medications   amLODipine 5 MG tablet Commonly known as: NORVASC Take 5 mg by mouth daily.   calcitRIOL 0.25 MCG capsule Commonly known as: ROCALTROL Take 0.25 mcg by mouth daily.   cholecalciferol 25 MCG (1000 UT) tablet Commonly known as: VITAMIN D3 Take 2,000 Units by mouth daily.      Follow-up Information    Mack Hook, MD Follow up.   Specialty: Internal Medicine Why: Call Dr. Melissa Noon office for a follow up appointment in 1-2 weeks Contact information: [place] [address] [phone]          No Known Allergies  Consultations:  Nephrology   Procedures/Studies: Dg Chest 2 View  Result Date: 06/04/2019 CLINICAL DATA:  Follow-up right mid lung masslike density EXAM: CHEST - 2 VIEW COMPARISON:  06/03/2019 FINDINGS: Cardiac shadow is enlarged but stable. Rounded density is again identified in the right mid lung with associated mild right pleural thickening. On the lateral projection this projects in the midportion of the right lung along the major fissure likely representing loculated fluid. Noncontrast CT would be confirmatory. The left lung remains clear. No acute bony abnormality is noted. Postsurgical changes in the cervical spine are seen. IMPRESSION: Stable masslike density in the right mid lung which appears to represent loculated fluid within the major fissure. Noncontrast CT would be confirmatory in nature. Electronically Signed   By: Inez Catalina M.D.   On: 06/04/2019 07:44   Dg Chest 2 View  Result Date: 06/03/2019 CLINICAL DATA:  Shortness of breath EXAM: CHEST - 2 VIEW COMPARISON:  November 27, 2013 FINDINGS: There is focal airspace opacity in the right middle lobe. Lungs elsewhere clear. Heart is enlarged with pulmonary vascularity normal. No adenopathy. There is aortic atherosclerosis. There is postoperative change in the lower  cervical region. IMPRESSION: Focal airspace opacity in the right middle lobe, a finding felt to represent pneumonia. Lungs elsewhere clear. There is cardiomegaly. Pulmonary vascularity normal. Aortic Atherosclerosis (ICD10-I70.0). Followup PA and lateral chest radiographs recommended in 3-4 weeks following trial of antibiotic therapy to ensure resolution and exclude underlying malignancy. Electronically Signed   By: Lowella Grip III M.D.   On: 06/03/2019 13:13   Dg Chest Port 1 View  Result Date: 06/08/2019 CLINICAL DATA:  Central line placement EXAM: PORTABLE CHEST 1 VIEW COMPARISON:  06/04/2019 FINDINGS: Interval placement of dual lumen right internal jugular approach central venous catheter with distal tip terminating at the level of the right atrium. Cardiac silhouette remains enlarged, stable. Pulmonary vasculature is nondilated. Masslike density within the right mid lung with blunting of the right costophrenic angle suggesting loculated pleural effusion, not significantly changed from prior. Left lung is clear. No pneumothorax. IMPRESSION: 1. Interval placement of right IJ central venous catheter. No pneumothorax. 2. Similar appearance of masslike density in the right mid lung, favored to represent loculated pleural fluid within the minor fissure. CT chest could be performed  for confirmation. Electronically Signed   By: Davina Poke M.D.   On: 06/08/2019 10:34   Dg Fluoro Guide Cv Line-no Report  Result Date: 06/08/2019 Fluoroscopy was utilized by the requesting physician.  No radiographic interpretation.      Subjective: Feeling well. No dyspnea, dizziness, chest pain.  No swelling, confusion, fever, nausea.  Discharge Exam: Vitals:   06/09/19 0613 06/09/19 0830  BP: 140/70 140/69  Pulse: 62 63  Resp: 16 18  Temp: 99.4 F (37.4 C) 98.3 F (36.8 C)  SpO2: 96% 97%   Vitals:   06/08/19 1719 06/08/19 2200 06/09/19 0613 06/09/19 0830  BP: (!) 149/67 134/62 140/70 140/69   Pulse: 67 61 62 63  Resp: 18 20 16 18   Temp: 98.7 F (37.1 C) 99.1 F (37.3 C) 99.4 F (37.4 C) 98.3 F (36.8 C)  TempSrc: Oral Oral Oral Oral  SpO2: 98% 96% 96% 97%  Weight:      Height:        General: Pt is alert, awake, not in acute distress Cardiovascular: RRR, nl S1-S2, no murmurs appreciated.   1+ LE edema.   Respiratory: Normal respiratory rate and rhythm.  CTAB without rales or wheezes. Abdominal: Abdomen soft and non-tender.  No distension or HSM.   Neuro/Psych: Strength symmetric in upper and lower extremities.  Judgment and insight appear normal.   The results of significant diagnostics from this hospitalization (including imaging, microbiology, ancillary and laboratory) are listed below for reference.     Microbiology: Recent Results (from the past 240 hour(s))  SARS CORONAVIRUS 2 (TAT 6-24 HRS) Nasopharyngeal Nasopharyngeal Swab     Status: None   Collection Time: 06/03/19  1:32 PM   Specimen: Nasopharyngeal Swab  Result Value Ref Range Status   SARS Coronavirus 2 NEGATIVE NEGATIVE Final    Comment: (NOTE) SARS-CoV-2 target nucleic acids are NOT DETECTED. The SARS-CoV-2 RNA is generally detectable in upper and lower respiratory specimens during the acute phase of infection. Negative results do not preclude SARS-CoV-2 infection, do not rule out co-infections with other pathogens, and should not be used as the sole basis for treatment or other patient management decisions. Negative results must be combined with clinical observations, patient history, and epidemiological information. The expected result is Negative. Fact Sheet for Patients: SugarRoll.be Fact Sheet for Healthcare Providers: https://www.woods-mathews.com/ This test is not yet approved or cleared by the [place] FDA and  has been authorized for detection and/or diagnosis of SARS-CoV-2 by FDA under an Emergency Use Authorization (EUA). This EUA will  remain  in effect (meaning this test can be used) for the duration of the COVID-19 declaration under Section 56 4(b)(1) of the Act, 21 U.S.C. section 360bbb-3(b)(1), unless the authorization is terminated or revoked sooner. Performed at [facility], [place] [address], [place], [place] [address]   Surgical pcr screen     Status: Abnormal   Collection Time: 06/04/19  7:32 AM   Specimen: Nasal Mucosa; Nasal Swab  Result Value Ref Range Status   MRSA, PCR NEGATIVE NEGATIVE Final   Staphylococcus aureus POSITIVE (A) NEGATIVE Final    Comment: (NOTE) The Xpert SA Assay (FDA approved for NASAL specimens in patients 76 years of age and older), is one component of a comprehensive surveillance program. It is not intended to diagnose infection nor to guide or monitor treatment. Performed at [facility], [place] [address], [place], [place] [address]   Culture, Urine     Status: None   Collection  Time: 06/06/19  3:17 PM   Specimen: Urine, Random  Result Value Ref Range Status   Specimen Description URINE, RANDOM  Final   Special Requests NONE  Final   Culture   Final    NO GROWTH Performed at [facility], [address], [place], [place] [address]    Report Status 06/07/2019 FINAL  Final  Surgical pcr screen     Status: Abnormal   Collection Time: 06/08/19  1:12 AM   Specimen: Nasal Mucosa; Nasal Swab  Result Value Ref Range Status   MRSA, PCR NEGATIVE NEGATIVE Final   Staphylococcus aureus POSITIVE (A) NEGATIVE Final    Comment: (NOTE) The Xpert SA Assay (FDA approved for NASAL specimens in patients 107 years of age and older), is one component of a comprehensive surveillance program. It is not intended to diagnose infection nor to guide or monitor treatment. Performed at [facility], [place] [address], [place], [place] [address]      Labs: BNP (last 3 results) No results for input(s): BNP in the last 8760 hours. Basic Metabolic Panel: Recent Labs  Lab  06/05/19 0541 06/06/19 0519 06/07/19 0741 06/08/19 0727 06/09/19 0517  NA 140 139 138 139 136  K 4.7 4.5 4.5 4.9 4.4  CL 107 107 107 108 102  CO2 20* 20* 19* 18* 25  GLUCOSE 95 133* 105* 98 112*  BUN 84* 84* 89* 92* 48*  CREATININE 9.36* 9.87* 10.76* 11.13* 7.18*  CALCIUM 7.9* 7.7* 7.8* 7.9* 7.8*  PHOS 6.9* 6.7* 6.4* 6.2* 4.7*   Liver Function Tests: Recent Labs  Lab 06/03/19 1230 06/04/19 0559 06/05/19 0541 06/06/19 0519 06/07/19 0741 06/08/19 0727 06/09/19 0517  AST 13* 11*  --   --   --   --   --   ALT 13 11  --   --   --   --   --   ALKPHOS 136* 124  --   --   --   --   --   BILITOT 0.7 0.5  --   --   --   --   --   PROT 6.2* 5.7*  --   --   --   --   --   ALBUMIN 3.0* 2.8* 2.6* 2.6* 2.7* 2.7* 2.6*   Recent Labs  Lab 06/03/19 1230  LIPASE 37   No results for input(s): AMMONIA in the last 168 hours. CBC: Recent Labs  Lab 06/03/19 1230  06/05/19 1645 06/06/19 0519 06/07/19 0741 06/08/19 0727 06/09/19 0517  WBC 6.6   < > 6.5 7.7 9.3 8.7 7.6  NEUTROABS 3.3  --   --   --   --   --   --   HGB 6.5*   < > 5.8* 5.6* 7.2* 7.0* 7.2*  HCT 20.7*   < > 17.9* 17.9* 22.2* 22.0* 22.4*  MCV 92.0   < > 88.6 90.9 91.4 92.4 92.6  PLT 155   < > 132* 138* 158 157 157   < > = values in this interval not displayed.   Cardiac Enzymes: No results for input(s): CKTOTAL, CKMB, CKMBINDEX, TROPONINI in the last 168 hours. BNP: Invalid input(s): POCBNP CBG: Recent Labs  Lab 06/08/19 0906 06/08/19 1009  GLUCAP 85 101*   D-Dimer No results for input(s): DDIMER in the last 72 hours. Hgb A1c No results for input(s): HGBA1C in the last 72 hours. Lipid Profile No results for input(s): CHOL, HDL, LDLCALC, TRIG, CHOLHDL, LDLDIRECT in the  last 72 hours. Thyroid function studies No results for input(s): TSH, T4TOTAL, T3FREE, THYROIDAB in the last 72 hours.  Invalid input(s): FREET3 Anemia work up No results for input(s): VITAMINB12, FOLATE, FERRITIN, TIBC, IRON, RETICCTPCT in  the last 72 hours. Urinalysis    Component Value Date/Time   COLORURINE YELLOW 06/06/2019 1530   APPEARANCEUR CLEAR 06/06/2019 1530   LABSPEC 1.014 06/06/2019 1530   PHURINE 6.0 06/06/2019 1530   GLUCOSEU 150 (A) 06/06/2019 1530   HGBUR SMALL (A) 06/06/2019 1530   BILIRUBINUR NEGATIVE 06/06/2019 1530   [place] 06/06/2019 1530   PROTEINUR >=300 (A) 06/06/2019 1530   NITRITE NEGATIVE 06/06/2019 1530   LEUKOCYTESUR NEGATIVE 06/06/2019 1530   Sepsis Labs Invalid input(s): PROCALCITONIN,  WBC,  LACTICIDVEN Microbiology Recent Results (from the past 240 hour(s))  SARS CORONAVIRUS 2 (TAT 6-24 HRS) Nasopharyngeal Nasopharyngeal Swab     Status: None   Collection Time: 06/03/19  1:32 PM   Specimen: Nasopharyngeal Swab  Result Value Ref Range Status   SARS Coronavirus 2 NEGATIVE NEGATIVE Final    Comment: (NOTE) SARS-CoV-2 target nucleic acids are NOT DETECTED. The SARS-CoV-2 RNA is generally detectable in upper and lower respiratory specimens during the acute phase of infection. Negative results do not preclude SARS-CoV-2 infection, do not rule out co-infections with other pathogens, and should not be used as the sole basis for treatment or other patient management decisions. Negative results must be combined with clinical observations, patient history, and epidemiological information. The expected result is Negative. Fact Sheet for Patients: SugarRoll.be Fact Sheet for Healthcare Providers: https://www.woods-mathews.com/ This test is not yet approved or cleared by the [place] FDA and  has been authorized for detection and/or diagnosis of SARS-CoV-2 by FDA under an Emergency Use Authorization (EUA). This EUA will remain  in effect (meaning this test can be used) for the duration of the COVID-19 declaration under Section 56 4(b)(1) of the Act, 21 U.S.C. section 360bbb-3(b)(1), unless the authorization is terminated  or revoked sooner. Performed at [facility], [place] [address], [place], [place] [address]   Surgical pcr screen     Status: Abnormal   Collection Time: 06/04/19  7:32 AM   Specimen: Nasal Mucosa; Nasal Swab  Result Value Ref Range Status   MRSA, PCR NEGATIVE NEGATIVE Final   Staphylococcus aureus POSITIVE (A) NEGATIVE Final    Comment: (NOTE) The Xpert SA Assay (FDA approved for NASAL specimens in patients 55 years of age and older), is one component of a comprehensive surveillance program. It is not intended to diagnose infection nor to guide or monitor treatment. Performed at [facility], [place] [address], [place], [place] [address]   Culture, Urine     Status: None   Collection Time: 06/06/19  3:17 PM   Specimen: Urine, Random  Result Value Ref Range Status   Specimen Description URINE, RANDOM  Final   Special Requests NONE  Final   Culture   Final    NO GROWTH Performed at [facility], [place] [address] [place], [place] [address]    Report Status 06/07/2019 FINAL  Final  Surgical pcr screen     Status: Abnormal   Collection Time: 06/08/19  1:12 AM   Specimen: Nasal Mucosa; Nasal Swab  Result Value Ref Range Status   MRSA, PCR NEGATIVE NEGATIVE Final   Staphylococcus aureus POSITIVE (A) NEGATIVE Final    Comment: (NOTE) The Xpert SA Assay (FDA approved for NASAL specimens in patients 22 years of  age and older), is one component of a comprehensive surveillance program. It is not intended to diagnose infection nor to guide or monitor treatment. Performed at [facility], [place] [address] [place], [place] [address]      Time coordinating discharge: 35 minutes      SIGNED:   Edwin Dada, MD  [facility] 06/09/2019, 9:47 AM

## 2019-06-09 NOTE — Progress Notes (Signed)
Patient has been accepted for OP HD treatment at [facility] on a TTS schedule with a seat time of 11:15am. He needs to arrive to his appointments 20 minutes early.  On his first day of treatment, he needs to arrive at 10:15am to complete intake paperwork. Renal Navigator updated Nephrologist/Dr. Justin Mend, who states patient is cleared for discharge from a Renal Standpoint. Renal Navigator met with patient with assistance from [place] to inform him of schedule verbally and in writing. He states his children will transport him and states understanding of the importance of going to all scheduled dialysis sessions.  Patient is cleared for discharge from an OP HD standpoint.  Alphonzo Cruise, [place] Renal Navigator [phone]

## 2019-06-09 NOTE — Progress Notes (Signed)
[facility] ROUNDING NOTE   Subjective:   This is a 63 year old gentleman diabetes hypertension chronic kidney disease followed by Dr. Joelyn Oms now on dialysis.  Was admitted from the emergency room with a creatinine of 12.8 potassium 6.2.  He had an AV fistula but difficulty assessing AV fistula on Thursday, 06/03/2019.  He underwent fistulogram and angioplasty 06/04/2019.  He underwent placement of right IJ catheter by Dr. Scot Dock 06/08/2019.     Blood pressure 140/69 pulse 63 temperature 98.3 O2 sats 97% room air   Sodium 136 potassium 4.4 chloride 102 CO2 25 BUN 48 creatinine 7.18 glucose 112 calcium 7.8 phosphorus 4.7 albumin 2.6 WBC 7.6 hemoglobin 7.2 platelets 157  Amlodipine 5 mg daily, Calcitrol 0.25 mcg daily, darbepoetin 200 mcg q. Friday,  Objective:  Vital signs in last 24 hours:  Temp:  [97.3 F (36.3 C)-99.5 F (37.5 C)] 98.3 F (36.8 C) (10/28 0830) Pulse Rate:  [61-79] 63 (10/28 0830) Resp:  [14-25] 18 (10/28 0830) BP: (133-161)/(62-84) 140/69 (10/28 0830) SpO2:  [94 %-100 %] 97 % (10/28 0830) Weight:  [74.7 kg] 74.7 kg (10/27 1624)  Weight change:  Filed Weights   06/04/19 1105 06/04/19 1341 06/08/19 1624  Weight: 81.3 kg 79.5 kg 74.7 kg    Intake/Output: I/O last 3 completed shifts: In: 207.6 [I.V.:107.6; IV Piggyback:100] Out: 3020 [Other:3000; Blood:20]   Intake/Output this shift:  Total I/O In: 300 [P.O.:300] Out: -   General:  AAOx3 NAD HEENT: MMM [place] AT anicteric sclera Neck:  No JVD, no adenopathy CV:  Heart RRR  Lungs:  L/S CTA bilaterally Abd:  abd SNT/ND with normal BS GU:  Bladder non-palpable Extremities: +2 left upper extremity edema with mild erythema, thrill weak. Skin:  No skin rash   Basic Metabolic Panel: Recent Labs  Lab 06/05/19 0541 06/06/19 0519 06/07/19 0741 06/08/19 0727 06/09/19 0517  NA 140 139 138 139 136  K 4.7 4.5 4.5 4.9 4.4  CL 107 107 107 108 102  CO2 20* 20* 19* 18* 25  GLUCOSE 95 133* 105*  98 112*  BUN 84* 84* 89* 92* 48*  CREATININE 9.36* 9.87* 10.76* 11.13* 7.18*  CALCIUM 7.9* 7.7* 7.8* 7.9* 7.8*  PHOS 6.9* 6.7* 6.4* 6.2* 4.7*    Liver Function Tests: Recent Labs  Lab 06/03/19 1230 06/04/19 0559 06/05/19 0541 06/06/19 0519 06/07/19 0741 06/08/19 0727 06/09/19 0517  AST 13* 11*  --   --   --   --   --   ALT 13 11  --   --   --   --   --   ALKPHOS 136* 124  --   --   --   --   --   BILITOT 0.7 0.5  --   --   --   --   --   PROT 6.2* 5.7*  --   --   --   --   --   ALBUMIN 3.0* 2.8* 2.6* 2.6* 2.7* 2.7* 2.6*   Recent Labs  Lab 06/03/19 1230  LIPASE 37   No results for input(s): AMMONIA in the last 168 hours.  CBC: Recent Labs  Lab 06/03/19 1230  06/05/19 1645 06/06/19 0519 06/07/19 0741 06/08/19 0727 06/09/19 0517  WBC 6.6   < > 6.5 7.7 9.3 8.7 7.6  NEUTROABS 3.3  --   --   --   --   --   --   HGB 6.5*   < > 5.8* 5.6* 7.2* 7.0* 7.2*  HCT  20.7*   < > 17.9* 17.9* 22.2* 22.0* 22.4*  MCV 92.0   < > 88.6 90.9 91.4 92.4 92.6  PLT 155   < > 132* 138* 158 157 157   < > = values in this interval not displayed.    Cardiac Enzymes: No results for input(s): CKTOTAL, CKMB, CKMBINDEX, TROPONINI in the last 168 hours.  BNP: Invalid input(s): POCBNP  CBG: Recent Labs  Lab 06/08/19 0906 06/08/19 1009  GLUCAP 85 101*    Microbiology: Results for orders placed or performed during the hospital encounter of 06/03/19  SARS CORONAVIRUS 2 (TAT 6-24 HRS) Nasopharyngeal Nasopharyngeal Swab     Status: None   Collection Time: 06/03/19  1:32 PM   Specimen: Nasopharyngeal Swab  Result Value Ref Range Status   SARS Coronavirus 2 NEGATIVE NEGATIVE Final    Comment: (NOTE) SARS-CoV-2 target nucleic acids are NOT DETECTED. The SARS-CoV-2 RNA is generally detectable in upper and lower respiratory specimens during the acute phase of infection. Negative results do not preclude SARS-CoV-2 infection, do not rule out co-infections with other pathogens, and should not  be used as the sole basis for treatment or other patient management decisions. Negative results must be combined with clinical observations, patient history, and epidemiological information. The expected result is Negative. Fact Sheet for Patients: SugarRoll.be Fact Sheet for Healthcare Providers: https://www.woods-mathews.com/ This test is not yet approved or cleared by the [place] FDA and  has been authorized for detection and/or diagnosis of SARS-CoV-2 by FDA under an Emergency Use Authorization (EUA). This EUA will remain  in effect (meaning this test can be used) for the duration of the COVID-19 declaration under Section 56 4(b)(1) of the Act, 21 U.S.C. section 360bbb-3(b)(1), unless the authorization is terminated or revoked sooner. Performed at [facility], [place] [address], [place], [place] [address]   Surgical pcr screen     Status: Abnormal   Collection Time: 06/04/19  7:32 AM   Specimen: Nasal Mucosa; Nasal Swab  Result Value Ref Range Status   MRSA, PCR NEGATIVE NEGATIVE Final   Staphylococcus aureus POSITIVE (A) NEGATIVE Final    Comment: (NOTE) The Xpert SA Assay (FDA approved for NASAL specimens in patients 57 years of age and older), is one component of a comprehensive surveillance program. It is not intended to diagnose infection nor to guide or monitor treatment. Performed at [facility], [place] [address], [place], [place] [address]   Culture, Urine     Status: None   Collection Time: 06/06/19  3:17 PM   Specimen: Urine, Random  Result Value Ref Range Status   Specimen Description URINE, RANDOM  Final   Special Requests NONE  Final   Culture   Final    NO GROWTH Performed at [facility], [place] [address], [place], [place] [address]    Report Status 06/07/2019 FINAL  Final  Surgical pcr screen     Status: Abnormal   Collection Time: 06/08/19  1:12 AM   Specimen: Nasal Mucosa; Nasal Swab   Result Value Ref Range Status   MRSA, PCR NEGATIVE NEGATIVE Final   Staphylococcus aureus POSITIVE (A) NEGATIVE Final    Comment: (NOTE) The Xpert SA Assay (FDA approved for NASAL specimens in patients 35 years of age and older), is one component of a comprehensive surveillance program. It is not intended to diagnose infection nor to guide or monitor treatment. Performed at [facility], [place] [address], [place],  [address]  Coagulation Studies: No results for input(s): LABPROT, INR in the last 72 hours.  Urinalysis: Recent Labs    06/06/19 1530  COLORURINE YELLOW  LABSPEC 1.014  PHURINE 6.0  GLUCOSEU 150*  HGBUR SMALL*  BILIRUBINUR NEGATIVE  KETONESUR NEGATIVE  PROTEINUR >=300*  NITRITE NEGATIVE  LEUKOCYTESUR NEGATIVE      Imaging: Dg Chest Port 1 View  Result Date: 06/08/2019 CLINICAL DATA:  Central line placement EXAM: PORTABLE CHEST 1 VIEW COMPARISON:  06/04/2019 FINDINGS: Interval placement of dual lumen right internal jugular approach central venous catheter with distal tip terminating at the level of the right atrium. Cardiac silhouette remains enlarged, stable. Pulmonary vasculature is nondilated. Masslike density within the right mid lung with blunting of the right costophrenic angle suggesting loculated pleural effusion, not significantly changed from prior. Left lung is clear. No pneumothorax. IMPRESSION: 1. Interval placement of right IJ central venous catheter. No pneumothorax. 2. Similar appearance of masslike density in the right mid lung, favored to represent loculated pleural fluid within the minor fissure. CT chest could be performed for confirmation. Electronically Signed   By: Davina Poke M.D.   On: 06/08/2019 10:34   Dg Fluoro Guide Cv Line-no Report  Result Date: 06/08/2019 Fluoroscopy was utilized by the requesting physician.  No radiographic interpretation.     Medications:    . sodium chloride   Intravenous Once  .  amLODipine  5 mg Oral Daily  . calcitRIOL  0.25 mcg Oral Daily  . Chlorhexidine Gluconate Cloth  6 each Topical Q0600  . cholecalciferol  2,000 Units Oral Daily  . darbepoetin (ARANESP) injection - DIALYSIS  200 mcg Intravenous Q Fri-HD   acetaminophen **OR** acetaminophen, ondansetron (ZOFRAN) IV, oxyCODONE-acetaminophen  Assessment/ Plan:  Ryan Wells Diazis a 63 y.o.malewith longstanding diabetes mellitus and hypertension as well as CKD followed by Dr. Joelyn Oms at [facility]. He has had progressive CKD with a creatinine over 5 for quite some time.  1.  End-stage renal disease.  Initiated dialysis this admission.    Status post dialysis 06/08/2019.  3 L removed.   Patient TTS at [facility]   Patient resting left upper extremity fistula.  2.  Hypertension.  Stable.  3.  Anemia.  Transfuse as needed.    Darbepoetin 200 mcg q. Friday  4.  Vascular access.  Status post angioplasty today of left upper extremity AV fistula.  Still with difficulty accessing.    Resting AV fistula at this present time tunneled dialysis catheter placed 06/08/2019 appreciate assistance of vascular surgery  5.  Hyperkalemia.  Improved   LOS: [place] @TODAY @10 :09 AM

## 2019-06-09 NOTE — Progress Notes (Signed)
Ryan Wells to be discharged Home per MD order. Discussed prescriptions and follow up appointments with the patient, son, and wife.  Son interpreting to patient and wife.  Medication list explained in detail, verbalized understanding.  Skin clean, dry and intact without evidence of skin break down, no evidence of skin tears noted. IV catheter x 2 discontinued intact. Site without signs and symptoms of complications. Dressing and pressure applied. Pt denies pain at the site currently. No complaints noted.  Given Printed information regarding the HD cath maintenance and also on renal diet in spanish.    Patient free of lines, drains, and wounds.   An After Visit Summary (AVS) was printed and given to the patient. Patient escorted via wheelchair, and discharged home via private auto.  Amaryllis Dyke, RN

## 2019-06-10 DIAGNOSIS — N2581 Secondary hyperparathyroidism of renal origin: Secondary | ICD-10-CM | POA: Diagnosis not present

## 2019-06-10 DIAGNOSIS — N186 End stage renal disease: Secondary | ICD-10-CM | POA: Diagnosis not present

## 2019-06-10 DIAGNOSIS — Z992 Dependence on renal dialysis: Secondary | ICD-10-CM | POA: Diagnosis not present

## 2019-06-10 DIAGNOSIS — D631 Anemia in chronic kidney disease: Secondary | ICD-10-CM | POA: Diagnosis not present

## 2019-06-10 DIAGNOSIS — E8770 Fluid overload, unspecified: Secondary | ICD-10-CM | POA: Diagnosis not present

## 2019-06-12 DIAGNOSIS — Z992 Dependence on renal dialysis: Secondary | ICD-10-CM | POA: Diagnosis not present

## 2019-06-12 DIAGNOSIS — N2581 Secondary hyperparathyroidism of renal origin: Secondary | ICD-10-CM | POA: Diagnosis not present

## 2019-06-12 DIAGNOSIS — N186 End stage renal disease: Secondary | ICD-10-CM | POA: Diagnosis not present

## 2019-06-15 DIAGNOSIS — N2581 Secondary hyperparathyroidism of renal origin: Secondary | ICD-10-CM | POA: Diagnosis not present

## 2019-06-15 DIAGNOSIS — Z992 Dependence on renal dialysis: Secondary | ICD-10-CM | POA: Diagnosis not present

## 2019-06-15 DIAGNOSIS — N186 End stage renal disease: Secondary | ICD-10-CM | POA: Diagnosis not present

## 2019-06-17 DIAGNOSIS — N186 End stage renal disease: Secondary | ICD-10-CM | POA: Diagnosis not present

## 2019-06-17 DIAGNOSIS — N2581 Secondary hyperparathyroidism of renal origin: Secondary | ICD-10-CM | POA: Diagnosis not present

## 2019-06-17 DIAGNOSIS — Z992 Dependence on renal dialysis: Secondary | ICD-10-CM | POA: Diagnosis not present

## 2019-06-19 DIAGNOSIS — N2581 Secondary hyperparathyroidism of renal origin: Secondary | ICD-10-CM | POA: Diagnosis not present

## 2019-06-19 DIAGNOSIS — Z992 Dependence on renal dialysis: Secondary | ICD-10-CM | POA: Diagnosis not present

## 2019-06-19 DIAGNOSIS — N186 End stage renal disease: Secondary | ICD-10-CM | POA: Diagnosis not present

## 2019-06-22 DIAGNOSIS — N186 End stage renal disease: Secondary | ICD-10-CM | POA: Diagnosis not present

## 2019-06-22 DIAGNOSIS — Z992 Dependence on renal dialysis: Secondary | ICD-10-CM | POA: Diagnosis not present

## 2019-06-22 DIAGNOSIS — N2581 Secondary hyperparathyroidism of renal origin: Secondary | ICD-10-CM | POA: Diagnosis not present

## 2019-06-24 DIAGNOSIS — N186 End stage renal disease: Secondary | ICD-10-CM | POA: Diagnosis not present

## 2019-06-24 DIAGNOSIS — Z992 Dependence on renal dialysis: Secondary | ICD-10-CM | POA: Diagnosis not present

## 2019-06-24 DIAGNOSIS — N2581 Secondary hyperparathyroidism of renal origin: Secondary | ICD-10-CM | POA: Diagnosis not present

## 2019-06-26 DIAGNOSIS — N186 End stage renal disease: Secondary | ICD-10-CM | POA: Diagnosis not present

## 2019-06-26 DIAGNOSIS — Z992 Dependence on renal dialysis: Secondary | ICD-10-CM | POA: Diagnosis not present

## 2019-06-26 DIAGNOSIS — N2581 Secondary hyperparathyroidism of renal origin: Secondary | ICD-10-CM | POA: Diagnosis not present

## 2019-06-29 DIAGNOSIS — Z992 Dependence on renal dialysis: Secondary | ICD-10-CM | POA: Diagnosis not present

## 2019-06-29 DIAGNOSIS — N186 End stage renal disease: Secondary | ICD-10-CM | POA: Diagnosis not present

## 2019-06-29 DIAGNOSIS — N2581 Secondary hyperparathyroidism of renal origin: Secondary | ICD-10-CM | POA: Diagnosis not present

## 2019-07-01 DIAGNOSIS — Z992 Dependence on renal dialysis: Secondary | ICD-10-CM | POA: Diagnosis not present

## 2019-07-01 DIAGNOSIS — N2581 Secondary hyperparathyroidism of renal origin: Secondary | ICD-10-CM | POA: Diagnosis not present

## 2019-07-01 DIAGNOSIS — N186 End stage renal disease: Secondary | ICD-10-CM | POA: Diagnosis not present

## 2019-07-03 DIAGNOSIS — N2581 Secondary hyperparathyroidism of renal origin: Secondary | ICD-10-CM | POA: Diagnosis not present

## 2019-07-03 DIAGNOSIS — Z992 Dependence on renal dialysis: Secondary | ICD-10-CM | POA: Diagnosis not present

## 2019-07-03 DIAGNOSIS — N186 End stage renal disease: Secondary | ICD-10-CM | POA: Diagnosis not present

## 2019-07-05 DIAGNOSIS — N186 End stage renal disease: Secondary | ICD-10-CM | POA: Diagnosis not present

## 2019-07-05 DIAGNOSIS — Z992 Dependence on renal dialysis: Secondary | ICD-10-CM | POA: Diagnosis not present

## 2019-07-05 DIAGNOSIS — N2581 Secondary hyperparathyroidism of renal origin: Secondary | ICD-10-CM | POA: Diagnosis not present

## 2019-07-07 DIAGNOSIS — Z992 Dependence on renal dialysis: Secondary | ICD-10-CM | POA: Diagnosis not present

## 2019-07-07 DIAGNOSIS — N186 End stage renal disease: Secondary | ICD-10-CM | POA: Diagnosis not present

## 2019-07-07 DIAGNOSIS — N2581 Secondary hyperparathyroidism of renal origin: Secondary | ICD-10-CM | POA: Diagnosis not present

## 2019-07-10 DIAGNOSIS — N2581 Secondary hyperparathyroidism of renal origin: Secondary | ICD-10-CM | POA: Diagnosis not present

## 2019-07-10 DIAGNOSIS — Z992 Dependence on renal dialysis: Secondary | ICD-10-CM | POA: Diagnosis not present

## 2019-07-10 DIAGNOSIS — N186 End stage renal disease: Secondary | ICD-10-CM | POA: Diagnosis not present

## 2019-07-12 DIAGNOSIS — Z992 Dependence on renal dialysis: Secondary | ICD-10-CM | POA: Diagnosis not present

## 2019-07-12 DIAGNOSIS — N186 End stage renal disease: Secondary | ICD-10-CM | POA: Diagnosis not present

## 2019-07-13 ENCOUNTER — Telehealth: Payer: Self-pay

## 2019-07-13 DIAGNOSIS — N186 End stage renal disease: Secondary | ICD-10-CM | POA: Diagnosis not present

## 2019-07-13 DIAGNOSIS — Z992 Dependence on renal dialysis: Secondary | ICD-10-CM | POA: Diagnosis not present

## 2019-07-13 DIAGNOSIS — N2581 Secondary hyperparathyroidism of renal origin: Secondary | ICD-10-CM | POA: Diagnosis not present

## 2019-07-13 NOTE — Telephone Encounter (Signed)
° °  New message    Unable to contact patient at numbers provided. Scheduler reached out to referring provider [phone]) Patient needs M [facility] appointment , he is on dialysis. Reached out to his son at [phone] no voicemail set up

## 2019-07-15 DIAGNOSIS — N2581 Secondary hyperparathyroidism of renal origin: Secondary | ICD-10-CM | POA: Diagnosis not present

## 2019-07-15 DIAGNOSIS — Z992 Dependence on renal dialysis: Secondary | ICD-10-CM | POA: Diagnosis not present

## 2019-07-15 DIAGNOSIS — N186 End stage renal disease: Secondary | ICD-10-CM | POA: Diagnosis not present

## 2019-07-16 ENCOUNTER — Other Ambulatory Visit: Payer: Self-pay

## 2019-07-16 DIAGNOSIS — N184 Chronic kidney disease, stage 4 (severe): Secondary | ICD-10-CM

## 2019-07-17 DIAGNOSIS — Z992 Dependence on renal dialysis: Secondary | ICD-10-CM | POA: Diagnosis not present

## 2019-07-17 DIAGNOSIS — N186 End stage renal disease: Secondary | ICD-10-CM | POA: Diagnosis not present

## 2019-07-17 DIAGNOSIS — N2581 Secondary hyperparathyroidism of renal origin: Secondary | ICD-10-CM | POA: Diagnosis not present

## 2019-07-20 DIAGNOSIS — N2581 Secondary hyperparathyroidism of renal origin: Secondary | ICD-10-CM | POA: Diagnosis not present

## 2019-07-20 DIAGNOSIS — Z992 Dependence on renal dialysis: Secondary | ICD-10-CM | POA: Diagnosis not present

## 2019-07-20 DIAGNOSIS — N186 End stage renal disease: Secondary | ICD-10-CM | POA: Diagnosis not present

## 2019-07-21 ENCOUNTER — Other Ambulatory Visit: Payer: Self-pay

## 2019-07-21 ENCOUNTER — Ambulatory Visit (INDEPENDENT_AMBULATORY_CARE_PROVIDER_SITE_OTHER): Payer: Self-pay | Admitting: Physician Assistant

## 2019-07-21 ENCOUNTER — Ambulatory Visit (HOSPITAL_COMMUNITY)
Admission: RE | Admit: 2019-07-21 | Discharge: 2019-07-21 | Disposition: A | Discharge status: Home / Self Care | Payer: Medicare HMO | Source: Ambulatory Visit | Attending: Family | Admitting: Family

## 2019-07-21 ENCOUNTER — Encounter: Payer: Self-pay | Admitting: Physician Assistant

## 2019-07-21 ENCOUNTER — Other Ambulatory Visit: Payer: Self-pay | Admitting: *Deleted

## 2019-07-21 ENCOUNTER — Encounter: Payer: Self-pay | Admitting: *Deleted

## 2019-07-21 DIAGNOSIS — N184 Chronic kidney disease, stage 4 (severe): Secondary | ICD-10-CM | POA: Diagnosis not present

## 2019-07-21 DIAGNOSIS — N186 End stage renal disease: Secondary | ICD-10-CM | POA: Insufficient documentation

## 2019-07-21 DIAGNOSIS — Z992 Dependence on renal dialysis: Secondary | ICD-10-CM

## 2019-07-21 NOTE — Progress Notes (Signed)
Established Dialysis Access   History of Present Illness   Ryan Wells is a 63 y.o. (Jan 17, 1956) male who presents for re-evaluation for permanent access.  Surgical history significant for left brachiocephalic fistula created by Dr. Trula Wells 06/2018.  He was not started on dialysis until October of this year.  They were unable to access left arm AV fistula.  He underwent fistulogram with balloon angioplasty of 2 separate areas of stenosis on 06/04/2019 by Dr. Scot Wells.  Fistula was still unable to be accessed and thus a TDC was placed on 06/08/2019.  Fistula duplex today demonstrates an occluded fistula in the mid upper arm.  Vein mapping last year demonstrated a adequate left basilic vein for fistula conduit use.  He does not take any blood thinners.  He does not have a pacemaker.  An interpreter was present for today's visit.  The patient's PMH, PSH, SH, and FamHx were reviewed and are unchanged from prior visit.  Current Outpatient Medications  Medication Sig Dispense Refill  . cholecalciferol (VITAMIN D3) 25 MCG (1000 UT) tablet Take 2,000 Units by mouth daily.    . Cholecalciferol (VITAMIN D3) 50 MCG (2000 UT) TABS Take 2,000 Units by mouth daily.     . furosemide (LASIX) 20 MG tablet Take 1 tablet (20 mg total) by mouth daily. (Patient taking differently: Take 20 mg by mouth every evening. ) 30 tablet 0  . amLODipine (NORVASC) 5 MG tablet Take 1 tablet by mouth daily.    Marland Kitchen amLODipine (NORVASC) 5 MG tablet Take 5 mg by mouth daily.    . calcitRIOL (ROCALTROL) 0.25 MCG capsule Take 1 capsule (0.25 mcg total) by mouth daily. (Patient not taking: Reported on 07/21/2019) 30 capsule 0  . calcitRIOL (ROCALTROL) 0.25 MCG capsule Take 0.25 mcg by mouth daily.    Marland Kitchen HYDROcodone-acetaminophen (NORCO) 5-325 MG tablet Take 1 tablet by mouth every 6 (six) hours as needed for moderate pain. (Patient not taking: Reported on 07/21/2019) 10 tablet 0   No current facility-administered medications for  this visit.     On ROS today: 10 system ROS is negative unless otherwise noted in HPI   Physical Examination   Vitals:   07/21/19 1357  BP: 138/76  Pulse: 69  Resp: 16  Temp: 97.8 F (36.6 C)  TempSrc: Temporal  SpO2: 99%  Weight: 137 lb (62.1 kg)  Height: 5\' 2"  (1.575 m)   Body mass index is 25.06 kg/m.  General Alert, O x 3, WD, NAD  Pulmonary Sym exp, good B air movt, CTA B  Cardiac RRR, Nl S1, S2  Vascular Vessel Right Left  Radial Palpable Palpable  Brachial Palpable Palpable  Ulnar Not palpable Not palpable    Musculo- skeletal M/S 5/5 throughout  , Pulsatile fistula until mid upper arm with no palpable flow beyond this point  Neurologic A&O; CN grossly intact     Non-invasive Vascular Imaging    Fistula duplex demonstrates occluded left brachiocephalic fistula in mid upper arm    Medical Decision Making   Ryan Wells is a 63 y.o. male who presents with ESRD requiring hemodialysis.    Occluded left brachiocephalic fistula based on duplex today  Vein mapping last year demonstrated an adequate left basilic vein  Plan will be for left arm basilic vein fistula versus AV graft Risk, benefits, and alternatives to access surgery were discussed.   The patient is aware the risks include but are not limited to: bleeding, infection, steal syndrome, nerve  damage, thrombosis, failure to mature, and need for additional procedures.   The patient agrees to proceed with the procedure.   Ryan Ligas PA-C [facility] Office: [phone]  Clinic MD: Ryan Wells

## 2019-07-21 NOTE — H&P (View-Only) (Signed)
Established Dialysis Access   History of Present Illness   Ryan Wells is a 63 y.o. (Dec 22, 1955) male who presents for re-evaluation for permanent access.  Surgical history significant for left brachiocephalic fistula created by Dr. Trula Slade 06/2018.  He was not started on dialysis until October of this year.  They were unable to access left arm AV fistula.  He underwent fistulogram with balloon angioplasty of 2 separate areas of stenosis on 06/04/2019 by Dr. Scot Dock.  Fistula was still unable to be accessed and thus a TDC was placed on 06/08/2019.  Fistula duplex today demonstrates an occluded fistula in the mid upper arm.  Vein mapping last year demonstrated a adequate left basilic vein for fistula conduit use.  He does not take any blood thinners.  He does not have a pacemaker.  An interpreter was present for today's visit.  The patient's PMH, PSH, SH, and FamHx were reviewed and are unchanged from prior visit.  Current Outpatient Medications  Medication Sig Dispense Refill  . cholecalciferol (VITAMIN D3) 25 MCG (1000 UT) tablet Take 2,000 Units by mouth daily.    . Cholecalciferol (VITAMIN D3) 50 MCG (2000 UT) TABS Take 2,000 Units by mouth daily.     . furosemide (LASIX) 20 MG tablet Take 1 tablet (20 mg total) by mouth daily. (Patient taking differently: Take 20 mg by mouth every evening. ) 30 tablet 0  . amLODipine (NORVASC) 5 MG tablet Take 1 tablet by mouth daily.    Marland Kitchen amLODipine (NORVASC) 5 MG tablet Take 5 mg by mouth daily.    . calcitRIOL (ROCALTROL) 0.25 MCG capsule Take 1 capsule (0.25 mcg total) by mouth daily. (Patient not taking: Reported on 07/21/2019) 30 capsule 0  . calcitRIOL (ROCALTROL) 0.25 MCG capsule Take 0.25 mcg by mouth daily.    Marland Kitchen HYDROcodone-acetaminophen (NORCO) 5-325 MG tablet Take 1 tablet by mouth every 6 (six) hours as needed for moderate pain. (Patient not taking: Reported on 07/21/2019) 10 tablet 0   No current facility-administered medications for  this visit.     On ROS today: 10 system ROS is negative unless otherwise noted in HPI   Physical Examination   Vitals:   07/21/19 1357  BP: 138/76  Pulse: 69  Resp: 16  Temp: 97.8 F (36.6 C)  TempSrc: Temporal  SpO2: 99%  Weight: 137 lb (62.1 kg)  Height: 5\' 2"  (1.575 m)   Body mass index is 25.06 kg/m.  General Alert, O x 3, WD, NAD  Pulmonary Sym exp, good B air movt, CTA B  Cardiac RRR, Nl S1, S2  Vascular Vessel Right Left  Radial Palpable Palpable  Brachial Palpable Palpable  Ulnar Not palpable Not palpable    Musculo- skeletal M/S 5/5 throughout  , Pulsatile fistula until mid upper arm with no palpable flow beyond this point  Neurologic A&O; CN grossly intact     Non-invasive Vascular Imaging    Fistula duplex demonstrates occluded left brachiocephalic fistula in mid upper arm    Medical Decision Making   Ryan Wells is a 63 y.o. male who presents with ESRD requiring hemodialysis.    Occluded left brachiocephalic fistula based on duplex today  Vein mapping last year demonstrated an adequate left basilic vein  Plan will be for left arm basilic vein fistula versus AV graft Risk, benefits, and alternatives to access surgery were discussed.   The patient is aware the risks include but are not limited to: bleeding, infection, steal syndrome, nerve  damage, thrombosis, failure to mature, and need for additional procedures.   The patient agrees to proceed with the procedure.   Dagoberto Ligas PA-C [facility] Office: [phone]  Clinic MD: [name]

## 2019-07-21 NOTE — Progress Notes (Signed)
Reviewed all instructions(see letter) with interpreter at chair side. Verbalized understanding.

## 2019-07-22 ENCOUNTER — Other Ambulatory Visit: Payer: Self-pay | Admitting: *Deleted

## 2019-07-22 DIAGNOSIS — N2581 Secondary hyperparathyroidism of renal origin: Secondary | ICD-10-CM | POA: Diagnosis not present

## 2019-07-22 DIAGNOSIS — Z992 Dependence on renal dialysis: Secondary | ICD-10-CM | POA: Diagnosis not present

## 2019-07-22 DIAGNOSIS — N186 End stage renal disease: Secondary | ICD-10-CM | POA: Diagnosis not present

## 2019-07-22 NOTE — Progress Notes (Signed)
Called and spoke with Danyella at [facility]. Patient agreeable to be at [facility] admitting at 5:30 am on 07/26/2019 for surgery.

## 2019-07-23 ENCOUNTER — Other Ambulatory Visit: Payer: Self-pay

## 2019-07-23 ENCOUNTER — Other Ambulatory Visit (HOSPITAL_COMMUNITY)
Admission: RE | Admit: 2019-07-23 | Discharge: 2019-07-23 | Disposition: A | Discharge status: Home / Self Care | Payer: Medicare HMO | Source: Ambulatory Visit | Attending: Vascular Surgery | Admitting: Vascular Surgery

## 2019-07-23 ENCOUNTER — Encounter (HOSPITAL_COMMUNITY): Payer: Self-pay | Admitting: Vascular Surgery

## 2019-07-23 DIAGNOSIS — Z20828 Contact with and (suspected) exposure to other viral communicable diseases: Secondary | ICD-10-CM | POA: Diagnosis not present

## 2019-07-23 DIAGNOSIS — Z01812 Encounter for preprocedural laboratory examination: Secondary | ICD-10-CM | POA: Diagnosis not present

## 2019-07-23 LAB — SARS CORONAVIRUS 2 (TAT 6-24 HRS): SARS Coronavirus 2: NEGATIVE

## 2019-07-23 NOTE — Progress Notes (Signed)
Administrator, sports used # [id number] Ismael.  Got disconnected at end of call,  Called back used Interpreter # [phone] [facility]  Patient denies shortness of breath, fever, cough and chest pain.  PCP -Dr Mack Hook  Cardiologist - denies Nephrologist - Dr Pearson Grippe  Chest x-ray -  06/08/19, 1 view EKG - 06/04/19 Stress Test -  ECHO - 11/28/13 Cardiac Cath - denies  Fasting Blood Sugar - Unknown Checks Blood Sugar ___0__ times a day  Anesthesia review: Yes  STOP now taking any Aspirin (unless otherwise instructed by your surgeon), Aleve, Naproxen, Ibuprofen, Motrin, Advil, Goody's, BC's, all herbal medications, fish oil, and all vitamins.   Coronavirus Screening Have you experienced the following symptoms:  Cough yes/no: No Fever (>100.65F)  yes/no: No Runny nose yes/no: No Sore throat yes/no: No Difficulty breathing/shortness of breath  yes/no: No  Have you traveled in the last 14 days and where? yes/no: No  Patient verbalized understanding of instructions that were given via phone.

## 2019-07-23 NOTE — Progress Notes (Signed)
Anesthesia Chart Review: Ryan Wells   Case: [id number] Date/Time: 07/26/19 0715   Procedure: ARTERIOVENOUS (AV) FISTULA CREATION VERSUS INSERTION OF ARTERIOVENOUS GRAFT LEFT ARM (Left )   Anesthesia type: Choice   Pre-op diagnosis: END STAGE RENAL DISEASE FOR HEMODIALYSIS ACCESS   Location: [place] OR ROOM 16 / [place] [facility]   Surgeons: Angelia Mould, MD      DISCUSSION: Patient is a 63 year old male scheduled for the above procedure. Admitted 10/22-20-06/09/19 with acute on chronic renal failure and required coil embolization of competing branch of his LUE AVF 1023/20 and insertion of right IJ tunneled dialysis catheter for hemodialysis 06/08/19.  The AVF was noted to be occluded at 07/21/19 visit with VVS.   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 - "MI" in 2015 is documented in history, but from records in [facility], he was admitted 11/27/13-11/29/13 with atypical chest pain. Troponin negative. No PE by CTA. DM poorly controlled. Echo showed normal LVEF. Primary care follow-up recommended for consideration of out-patient stress test.  He was seen by Chari Manning, NP on 12/06/13 for hospital follow-up and establish care, and notes do not indicate need for referral for cardiology or for stress test.  - AAA is documented in his history, however, I reviewed impression for CT abd/pelvis without contrast from 04/09/18 and states, "Vascular/Lymphatic: The abdominal aorta and IVC are grossly unremarkable on this noncontrast CT". 04/16/19 note by Margarite Gouge, MD at [facility] renal transplant evaluation wrote, "CT scan and his kidneys are without gross lesions, and the iliac vessels are suitable arterial targets." CTA of the chest from 11/1913 show normal aortic caliber in the chest without dissection.   He has a same-day work-up. so he will get labs and anesthesia evaluation on the day of surgery.   07/23/2019 presurgical COVID-19 test is still in process.   VS: There were no vitals taken for this visit.  BP Readings from Last 3 Encounters:  07/21/19 138/76  06/09/19 140/69  09/16/18 (!) 153/71    PROVIDERS: Mack Hook, MD is PCP Pearson Grippe, MD is nephrologist. He is also undergoing a renal transplant evaluation at [facility].   LABS: He is for labs on arrival.  As of 06/09/2019, H&H 7.2/22.4.  A1c 5.5 06/04/19.   IMAGES: 1V PCXR 06/08/19: FINDINGS: Interval placement of dual lumen right internal jugular approach central venous catheter with distal tip terminating at the level of the right atrium. Cardiac silhouette remains enlarged, stable. Pulmonary vasculature is nondilated. Masslike density within the right mid lung with blunting of the right costophrenic angle suggesting loculated pleural effusion, not significantly changed from prior. Left lung is clear. No pneumothorax. IMPRESSION: 1. Interval placement of right IJ central venous catheter. No pneumothorax. 2. Similar appearance of masslike density in the right mid lung, favored to represent loculated pleural fluid within the minor fissure. CT chest could be performed for confirmation.   EKG: 06/04/19: Normal sinus rhythm LOW VOLTAGE Compared to yesterday's EKG, low voltage is more pronounced Confirmed by Adrian Prows ([id number]) on 06/05/2019 7:36:41 AM   CV: Echo 11/28/13: Study Conclusions  Left ventricle: The cavity size was normal. Wall thickness  was normal. Systolic function was normal. The estimated  ejection fraction was in the range of 60% to 65%. Wall  motion was normal; there were no regional wall motion  abnormalities. Left ventricular diastolic function  parameters were normal.       Past  Medical History:  Diagnosis Date  . 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 AAA (abdominal aortic aneurysm) ([place])    Not noted on CT abd 2019  . Chest pain 11/2013   Normal Echo/  EKG/enzymes-hospitalized.  Did not get outpatient stress testing following hospitalization.  No chest pain since  . Chronic kidney disease   . Diabetes mellitus without complication ([place])   . Diabetes type 2, uncontrolled ([place]) 11/25/2011  . Diabetic gastroparesis ([place]) 01/22/2017  . Hypertension   . Microalbuminuria 01/19/2017  . Myocardial infarction [facility]) 2015    Past Surgical History:  Procedure Laterality Date  . A/V FISTULAGRAM Left 06/04/2019   Procedure: A/V FISTULAGRAM;  Surgeon: Angelia Mould, MD;  Location: [place] CV LAB;  Service: Cardiovascular;  Laterality: Left;  . AV FISTULA PLACEMENT Left 07/02/2018   Procedure: BRACHIOCEPHALIC ARTERIOVENOUS (AV) FISTULA CREATION LEFT ARM;  Surgeon: Serafina Mitchell, MD;  Location: [place];  Service: Vascular;  Laterality: Left;  . EMBOLIZATION Left 06/04/2019   Procedure: EMBOLIZATION;  Surgeon: Angelia Mould, MD;  Location: [place] CV LAB;  Service: Cardiovascular;  Laterality: Left;  LT ARM FISTULA/COMPETING BRANCH  . EYE SURGERY Bilateral    cataracts x2  . EYE SURGERY    . INSERTION OF DIALYSIS CATHETER Right 06/08/2019   Procedure: INSERTION OF PALINDROME DIALYSIS CATHETER IN RIGHT INTERNAL JUGULAR;  Surgeon: Angelia Mould, MD;  Location: [place];  Service: Vascular;  Laterality: Right;  . NECK SURGERY    . PERIPHERAL VASCULAR BALLOON ANGIOPLASTY Left 06/04/2019   Procedure: PERIPHERAL VASCULAR BALLOON ANGIOPLASTY;  Surgeon: Angelia Mould, MD;  Location: [place] CV LAB;  Service: Cardiovascular;  Laterality: Left;  ARM FISTULA    MEDICATIONS: No current facility-administered medications for this encounter.   Marland Kitchen amLODipine (NORVASC) 5 MG tablet  . amLODipine (NORVASC) 5 MG tablet  . calcitRIOL (ROCALTROL) 0.25 MCG capsule  . calcitRIOL (ROCALTROL) 0.25 MCG capsule  . cholecalciferol (VITAMIN D3) 25 MCG (1000 UT) tablet  . Cholecalciferol (VITAMIN D3) 50 MCG (2000 UT) TABS  .  furosemide (LASIX) 20 MG tablet  . HYDROcodone-acetaminophen (NORCO) 5-325 MG tablet    Myra Gianotti, PA-C Surgical Short Stay/Anesthesiology [facility] Phone [phone] [facility] Phone [phone] 07/23/2019 2:46 PM

## 2019-07-23 NOTE — Anesthesia Preprocedure Evaluation (Addendum)
Anesthesia Evaluation  Patient identified by MRN, date of birth, ID band Patient awake    Reviewed: Allergy & Precautions, H&P , NPO status , Patient's Chart, lab work & pertinent test results  Airway Mallampati: II  TM Distance: >3 FB Neck ROM: Full    Dental no notable dental hx. (+) Partial Upper, Partial Lower, Dental Advisory Given   Pulmonary neg pulmonary ROS,    Pulmonary exam normal breath sounds clear to auscultation       Cardiovascular Exercise Tolerance: Good hypertension, Pt. on medications  Rhythm:Regular Rate:Normal     Neuro/Psych negative neurological ROS  negative psych ROS   GI/Hepatic negative GI ROS, Neg liver ROS,   Endo/Other  diabetesHypothyroidism   Renal/GU ESRF and DialysisRenal disease  negative genitourinary   Musculoskeletal   Abdominal   Peds  Hematology  (+) Blood dyscrasia, anemia ,   Anesthesia Other Findings   Reproductive/Obstetrics negative OB ROS                            Anesthesia Physical Anesthesia Plan  ASA: III  Anesthesia Plan: MAC   Post-op Pain Management:    Induction: Intravenous  PONV Risk Score and Plan: 2 and Propofol infusion, Midazolam and Ondansetron  Airway Management Planned: Simple Face Mask  Additional Equipment:   Intra-op Plan:   Post-operative Plan:   Informed Consent: I have reviewed the patients History and Physical, chart, labs and discussed the procedure including the risks, benefits and alternatives for the proposed anesthesia with the patient or authorized representative who has indicated his/her understanding and acceptance.     Dental advisory given  Plan Discussed with: CRNA  Anesthesia Plan Comments: (PAT note written 07/23/2019 by Myra Gianotti, PA-C. )       Anesthesia Quick Evaluation

## 2019-07-24 DIAGNOSIS — N2581 Secondary hyperparathyroidism of renal origin: Secondary | ICD-10-CM | POA: Diagnosis not present

## 2019-07-24 DIAGNOSIS — N186 End stage renal disease: Secondary | ICD-10-CM | POA: Diagnosis not present

## 2019-07-24 DIAGNOSIS — Z992 Dependence on renal dialysis: Secondary | ICD-10-CM | POA: Diagnosis not present

## 2019-07-26 ENCOUNTER — Ambulatory Visit (HOSPITAL_COMMUNITY)
Admission: RE | Admit: 2019-07-26 | Discharge: 2019-07-26 | Disposition: A | Discharge status: Home / Self Care | Payer: Medicare HMO | Source: Ambulatory Visit | Attending: Vascular Surgery | Admitting: Vascular Surgery

## 2019-07-26 ENCOUNTER — Encounter (HOSPITAL_COMMUNITY)
Admission: RE | Disposition: A | Discharge status: Home / Self Care | Payer: Self-pay | Source: Ambulatory Visit | Attending: Vascular Surgery

## 2019-07-26 ENCOUNTER — Other Ambulatory Visit: Payer: Self-pay

## 2019-07-26 ENCOUNTER — Ambulatory Visit (HOSPITAL_COMMUNITY): Payer: Medicare HMO | Admitting: Vascular Surgery

## 2019-07-26 ENCOUNTER — Encounter (HOSPITAL_COMMUNITY): Payer: Self-pay | Admitting: Vascular Surgery

## 2019-07-26 DIAGNOSIS — E1122 Type 2 diabetes mellitus with diabetic chronic kidney disease: Secondary | ICD-10-CM | POA: Insufficient documentation

## 2019-07-26 DIAGNOSIS — Z79899 Other long term (current) drug therapy: Secondary | ICD-10-CM | POA: Insufficient documentation

## 2019-07-26 DIAGNOSIS — Z992 Dependence on renal dialysis: Secondary | ICD-10-CM | POA: Diagnosis not present

## 2019-07-26 DIAGNOSIS — T82898A Other specified complication of vascular prosthetic devices, implants and grafts, initial encounter: Secondary | ICD-10-CM | POA: Diagnosis not present

## 2019-07-26 DIAGNOSIS — I120 Hypertensive chronic kidney disease with stage 5 chronic kidney disease or end stage renal disease: Secondary | ICD-10-CM | POA: Diagnosis not present

## 2019-07-26 DIAGNOSIS — E1143 Type 2 diabetes mellitus with diabetic autonomic (poly)neuropathy: Secondary | ICD-10-CM | POA: Diagnosis not present

## 2019-07-26 DIAGNOSIS — I252 Old myocardial infarction: Secondary | ICD-10-CM | POA: Insufficient documentation

## 2019-07-26 DIAGNOSIS — N186 End stage renal disease: Secondary | ICD-10-CM | POA: Insufficient documentation

## 2019-07-26 HISTORY — PX: LIGATION OF ARTERIOVENOUS  FISTULA: SHX5948

## 2019-07-26 HISTORY — PX: BASCILIC VEIN TRANSPOSITION: SHX5742

## 2019-07-26 LAB — GLUCOSE, CAPILLARY
Glucose-Capillary: 81 mg/dL (ref 70–99)
Glucose-Capillary: 73 mg/dL (ref 70–99)

## 2019-07-26 LAB — POCT I-STAT, CHEM 8
BUN: 57 mg/dL — ABNORMAL HIGH (ref 8–23)
Calcium, Ion: 1.03 mmol/L — ABNORMAL LOW (ref 1.15–1.40)
Chloride: 99 mmol/L (ref 98–111)
Creatinine, Ser: 7.7 mg/dL — ABNORMAL HIGH (ref 0.61–1.24)
Glucose, Bld: 83 mg/dL (ref 70–99)
HCT: 37 % — ABNORMAL LOW (ref 39.0–52.0)
Hemoglobin: 12.6 g/dL — ABNORMAL LOW (ref 13.0–17.0)
Potassium: 5.6 mmol/L — ABNORMAL HIGH (ref 3.5–5.1)
Sodium: 138 mmol/L (ref 135–145)
TCO2: 29 mmol/L (ref 22–32)

## 2019-07-26 SURGERY — TRANSPOSITION, VEIN, BASILIC
Anesthesia: Monitor Anesthesia Care | Site: Arm Upper | Laterality: Left

## 2019-07-26 MED ORDER — PAPAVERINE HCL 30 MG/ML IJ SOLN
INTRAMUSCULAR | Status: AC
  Filled 2019-07-26: qty 2

## 2019-07-26 MED ORDER — SODIUM CHLORIDE 0.9 % IV SOLN
INTRAVENOUS | Status: DC | PRN
  Administered 2019-07-26: 500 mL

## 2019-07-26 MED ORDER — LIDOCAINE-EPINEPHRINE (PF) 1 %-1:200000 IJ SOLN
INTRAMUSCULAR | Status: DC | PRN
  Administered 2019-07-26: 56 mL

## 2019-07-26 MED ORDER — ONDANSETRON HCL 4 MG/2ML IJ SOLN
INTRAMUSCULAR | Status: DC | PRN
  Administered 2019-07-26: 4 mg via INTRAVENOUS

## 2019-07-26 MED ORDER — CHLORHEXIDINE GLUCONATE 4 % EX LIQD: 60.0000 mL | Freq: Once | CUTANEOUS | Status: DC

## 2019-07-26 MED ORDER — ACETAMINOPHEN 500 MG PO TABS
1000.0000 mg | ORAL_TABLET | Freq: Once | ORAL | Status: AC
  Administered 2019-07-26: 1000 mg via ORAL
  Filled 2019-07-26: qty 2

## 2019-07-26 MED ORDER — PROPOFOL 10 MG/ML IV BOLUS
INTRAVENOUS | Status: AC
  Filled 2019-07-26: qty 20

## 2019-07-26 MED ORDER — MIDAZOLAM HCL 5 MG/5ML IJ SOLN
INTRAMUSCULAR | Status: DC | PRN
  Administered 2019-07-26: 2 mg via INTRAVENOUS

## 2019-07-26 MED ORDER — CEFAZOLIN SODIUM-DEXTROSE 2-4 GM/100ML-% IV SOLN
2.0000 g | INTRAVENOUS | Status: AC
  Administered 2019-07-26: 2 g via INTRAVENOUS
  Filled 2019-07-26: qty 100

## 2019-07-26 MED ORDER — FENTANYL CITRATE (PF) 100 MCG/2ML IJ SOLN
INTRAMUSCULAR | Status: DC | PRN
  Administered 2019-07-26 (×3): 50 ug via INTRAVENOUS

## 2019-07-26 MED ORDER — LIDOCAINE 2% (20 MG/ML) 5 ML SYRINGE
INTRAMUSCULAR | Status: AC
  Filled 2019-07-26: qty 5

## 2019-07-26 MED ORDER — SODIUM CHLORIDE 0.9 % IV SOLN
INTRAVENOUS | Status: AC
  Filled 2019-07-26: qty 1.2

## 2019-07-26 MED ORDER — 0.9 % SODIUM CHLORIDE (POUR BTL) OPTIME
TOPICAL | Status: DC | PRN
  Administered 2019-07-26: 1000 mL

## 2019-07-26 MED ORDER — PROPOFOL 500 MG/50ML IV EMUL
INTRAVENOUS | Status: DC | PRN
  Administered 2019-07-26: 25 ug/kg/min via INTRAVENOUS

## 2019-07-26 MED ORDER — HEPARIN SODIUM (PORCINE) 1000 UNIT/ML IJ SOLN
INTRAMUSCULAR | Status: DC | PRN
  Administered 2019-07-26: 6000 [IU] via INTRAVENOUS

## 2019-07-26 MED ORDER — HYDROCODONE-ACETAMINOPHEN 5-325 MG PO TABS: 1.0000 | ORAL_TABLET | Freq: Four times a day (QID) | ORAL | 0 refills | Status: DC | PRN

## 2019-07-26 MED ORDER — LIDOCAINE-EPINEPHRINE 1 %-1:100000 IJ SOLN
INTRAMUSCULAR | Status: AC
  Filled 2019-07-26: qty 1

## 2019-07-26 MED ORDER — FENTANYL CITRATE (PF) 250 MCG/5ML IJ SOLN
INTRAMUSCULAR | Status: AC
  Filled 2019-07-26: qty 5

## 2019-07-26 MED ORDER — HYDROMORPHONE HCL 1 MG/ML IJ SOLN: 0.2500 mg | INTRAMUSCULAR | Status: DC | PRN

## 2019-07-26 MED ORDER — LIDOCAINE HCL (PF) 1 % IJ SOLN
INTRAMUSCULAR | Status: AC
  Filled 2019-07-26: qty 30

## 2019-07-26 MED ORDER — SODIUM CHLORIDE 0.9 % IV SOLN
INTRAVENOUS | Status: DC
  Administered 2019-07-26 (×2): via INTRAVENOUS

## 2019-07-26 MED ORDER — PAPAVERINE HCL 30 MG/ML IJ SOLN
INTRAMUSCULAR | Status: DC | PRN
  Administered 2019-07-26: 60 mg

## 2019-07-26 MED ORDER — MIDAZOLAM HCL 2 MG/2ML IJ SOLN
INTRAMUSCULAR | Status: AC
  Filled 2019-07-26: qty 2

## 2019-07-26 MED ORDER — SUCCINYLCHOLINE CHLORIDE 200 MG/10ML IV SOSY
PREFILLED_SYRINGE | INTRAVENOUS | Status: AC
  Filled 2019-07-26: qty 10

## 2019-07-26 MED ORDER — PROTAMINE SULFATE 10 MG/ML IV SOLN
INTRAVENOUS | Status: DC | PRN
  Administered 2019-07-26: 30 mg via INTRAVENOUS

## 2019-07-26 SURGICAL SUPPLY — 38 items
ADH SKN CLS APL DERMABOND .7 (GAUZE/BANDAGES/DRESSINGS) ×4
ARMBAND PINK RESTRICT EXTREMIT (MISCELLANEOUS) ×5 IMPLANT
CANISTER SUCT 3000ML PPV (MISCELLANEOUS) ×4 IMPLANT
CANNULA VESSEL 3MM 2 BLNT TIP (CANNULA) ×7 IMPLANT
CLIP VESOCCLUDE MED 6/CT (CLIP) ×4 IMPLANT
CLIP VESOCCLUDE SM WIDE 6/CT (CLIP) ×10 IMPLANT
COVER PROBE W GEL 5X96 (DRAPES) IMPLANT
COVER WAND RF STERILE (DRAPES) ×4 IMPLANT
DECANTER SPIKE VIAL GLASS SM (MISCELLANEOUS) ×4 IMPLANT
DERMABOND ADVANCED (GAUZE/BANDAGES/DRESSINGS) ×4
DERMABOND ADVANCED .7 DNX12 (GAUZE/BANDAGES/DRESSINGS) ×3 IMPLANT
ELECT REM PT RETURN 9FT ADLT (ELECTROSURGICAL) ×4
ELECTRODE REM PT RTRN 9FT ADLT (ELECTROSURGICAL) ×2 IMPLANT
GAUZE SPONGE 4X4 16PLY XRAY LF (GAUZE/BANDAGES/DRESSINGS) ×3 IMPLANT
GLOVE BIO SURGEON STRL SZ7.5 (GLOVE) ×4 IMPLANT
GLOVE BIOGEL PI IND STRL 8 (GLOVE) ×2 IMPLANT
GLOVE BIOGEL PI INDICATOR 8 (GLOVE) ×2
GLOVE INDICATOR 7.0 STRL GRN (GLOVE) ×3 IMPLANT
GLOVE SURG SS PI 6.5 STRL IVOR (GLOVE) ×3 IMPLANT
GOWN STRL REUS W/ TWL LRG LVL3 (GOWN DISPOSABLE) ×6 IMPLANT
GOWN STRL REUS W/TWL LRG LVL3 (GOWN DISPOSABLE) ×12
KIT BASIN OR (CUSTOM PROCEDURE TRAY) ×4 IMPLANT
KIT TURNOVER KIT B (KITS) ×4 IMPLANT
NS IRRIG 1000ML POUR BTL (IV SOLUTION) ×4 IMPLANT
PACK CV ACCESS (CUSTOM PROCEDURE TRAY) ×4 IMPLANT
PAD ARMBOARD 7.5X6 YLW CONV (MISCELLANEOUS) ×8 IMPLANT
SPONGE SURGIFOAM ABS GEL 100 (HEMOSTASIS) IMPLANT
SUT PROLENE 6 0 BV (SUTURE) ×7 IMPLANT
SUT SILK 2 0 SH (SUTURE) ×3 IMPLANT
SUT SILK 3 0 (SUTURE) ×4
SUT SILK 3-0 18XBRD TIE 12 (SUTURE) ×1 IMPLANT
SUT VIC AB 3-0 SH 27 (SUTURE) ×8
SUT VIC AB 3-0 SH 27X BRD (SUTURE) ×3 IMPLANT
SUT VICRYL 4-0 PS2 18IN ABS (SUTURE) ×7 IMPLANT
SYR 20ML LL LF (SYRINGE) ×3 IMPLANT
TOWEL GREEN STERILE (TOWEL DISPOSABLE) ×4 IMPLANT
UNDERPAD 30X30 (UNDERPADS AND DIAPERS) ×4 IMPLANT
WATER STERILE IRR 1000ML POUR (IV SOLUTION) ×4 IMPLANT

## 2019-07-26 NOTE — Op Note (Signed)
    NAME: Ryan Wells    MRN: [id number] DOB: [birth year]    DATE OF OPERATION: 07/26/2019  PREOP DIAGNOSIS:    End-stage renal disease  POSTOP DIAGNOSIS:    Same  PROCEDURE:    Ligation left brachiocephalic fistula Left basilic vein transposition  SURGEON: Judeth Cornfield. Scot Dock, MD  ASSIST: Risa Grill, PA  ANESTHESIA: Local with sedation  EBL: Minimal  INDICATIONS:    Ryan Wells is a 63 y.o. male who dialyzes on Tuesdays Thursdays and Saturdays.  He has a functioning right IJ tunneled dialysis catheter.  He had a left brachiocephalic fistula which ultimately occluded.  However the proximal half of the fistula is pulsatile.  I therefore elected to ligate the fistula proximally.  FINDINGS:   Excellent thrill at the completion of the procedure.  Brisk radial and ulnar signal with the Doppler at the completion of the procedure.  TECHNIQUE:   The patient was taken to the operating room and I looked at the basilic vein myself with the SonoSite.  This appeared to be a adequate vein for a basilic vein transposition.  The left arm was prepped and draped in usual sterile fashion.  A longitudinal incision was made over the basilic vein just above the antecubital level and here the basilic vein was dissected free.  Branches were divided between clips and 3-0 silk ties.  The incision was extended slightly laterally allowing exposure of the brachial artery beneath the fascia.  I also identified the proximal brachiocephalic fistula which was pulsatile and this was ligated given that was a long segment of the fistula that was pulsatile.  Using an additional incision in the upper arm the basilic vein was harvested from the antecubital level up to the axilla with branches divided between clips and 3-0 silk ties.  The vein was then gently distended and marked to prevent twisting after it was ligated distally.  A tunnel was created from the distal incision to the axillary  incision after the skin was anesthetized.  The vein was brought through the tunnel and the patient was heparinized.  The brachial artery was clamped proximally and distally and a longitudinal arteriotomy was made.  The vein was spatulated and sewn end-to-side to the artery using continuous 6-0 Prolene suture.  At the completion was an excellent thrill in the fistula and a brisk radial and ulnar signal with the Doppler.  Hemostasis was obtained in the wounds.  Each of the wounds was closed with 2 deep layers of 3-0 Vicryl and the skin closed with 4-0 Vicryl.  Dermabond was applied.  The patient tolerated the procedure well was transferred to the recovery room in stable condition.  All needle and sponge counts were correct.  [name], MD, FACS [facility]  DATE OF DICTATION:   07/26/2019

## 2019-07-26 NOTE — Transfer of Care (Signed)
Immediate Anesthesia Transfer of Care Note  Patient: Ryan Wells  Procedure(s) Performed: Bascilic Vein Transposition (Left Arm Upper) Ligation Of BrachioCephalic  Fistula (Left Arm Upper)  Patient Location: PACU  Anesthesia Type:MAC  Level of Consciousness: drowsy and patient cooperative  Airway & Oxygen Therapy: Patient Spontanous Breathing  Post-op Assessment: Report given to RN and Post -op Vital signs reviewed and stable  Post vital signs: Reviewed and stable  Last Vitals:  Vitals Value Taken Time  BP 159/57 07/26/19 1000  Temp    Pulse 82 07/26/19 1001  Resp 13 07/26/19 1001  SpO2 94 % 07/26/19 1001  Vitals shown include unvalidated device data.  Last Pain:  Vitals:   07/26/19 0644  PainSc: 0-No pain         Complications: No apparent anesthesia complications

## 2019-07-26 NOTE — Interval H&P Note (Signed)
History and Physical Interval Note:  07/26/2019 7:26 AM  Mar Daring Keane Scrape  has presented today for surgery, with the diagnosis of END STAGE RENAL DISEASE FOR HEMODIALYSIS ACCESS.  The various methods of treatment have been discussed with the patient and family. After consideration of risks, benefits and other options for treatment, the patient has consented to  Procedure(s): ARTERIOVENOUS (AV) FISTULA CREATION VERSUS INSERTION OF ARTERIOVENOUS GRAFT LEFT ARM (Left) as a surgical intervention.  The patient's history has been reviewed, patient examined, no change in status, stable for surgery.  I have reviewed the patient's chart and labs.  Questions were answered to the patient's satisfaction.     [name]

## 2019-07-26 NOTE — Anesthesia Postprocedure Evaluation (Signed)
Anesthesia Post Note  Patient: Ryan Wells  Procedure(s) Performed: Bascilic Vein Transposition (Left Arm Upper) Ligation Of BrachioCephalic  Fistula (Left Arm Upper)     Patient location during evaluation: PACU Anesthesia Type: MAC Level of consciousness: awake and alert Pain management: pain level controlled Vital Signs Assessment: post-procedure vital signs reviewed and stable Respiratory status: spontaneous breathing, nonlabored ventilation and respiratory function stable Cardiovascular status: stable and blood pressure returned to baseline Postop Assessment: no apparent nausea or vomiting Anesthetic complications: no    Last Vitals:  Vitals:   07/26/19 1030 07/26/19 1045  BP: (!) 148/57 (!) 160/58  Pulse: 77 76  Resp: 17 14  Temp:  36.8 C  SpO2: 97% 97%    Last Pain:  Vitals:   07/26/19 1045  PainSc: 0-No pain                 Keena Heesch,W. EDMOND

## 2019-07-26 NOTE — Discharge Instructions (Signed)
   [facility]  Discharge Instructions  AV Fistula or Graft Surgery for Dialysis Access  Please refer to the following instructions for your post-procedure care. Your surgeon or physician assistant will discuss any changes with you.  Activity  You may drive the day following your surgery, if you are comfortable and no longer taking prescription pain medication. Resume full activity as the soreness in your incision resolves.  Bathing/Showering  You may shower after you go home. Keep your incision dry for 48 hours. Do not soak in a bathtub, hot tub, or swim until the incision heals completely. You may not shower if you have a hemodialysis catheter.  Incision Care  Clean your incision with mild soap and water after 48 hours. Pat the area dry with a clean towel. You do not need a bandage unless otherwise instructed. Do not apply any ointments or creams to your incision. You may have skin glue on your incision. Do not peel it off. It will come off on its own in about one week. Your arm may swell a bit after surgery. To reduce swelling use pillows to elevate your arm so it is above your heart. Your doctor will tell you if you need to lightly wrap your arm with an ACE bandage.  Diet  Resume your normal diet. There are not special food restrictions following this procedure. In order to heal from your surgery, it is CRITICAL to get adequate nutrition. Your body requires vitamins, minerals, and protein. Vegetables are the best source of vitamins and minerals. Vegetables also provide the perfect balance of protein. Processed food has little nutritional value, so try to avoid this.  Medications  Resume taking all of your medications. If your incision is causing pain, you may take over-the counter pain relievers such as acetaminophen (Tylenol). If you were prescribed a stronger pain medication, please be aware these medications can cause nausea and constipation. Prevent  nausea by taking the medication with a snack or meal. Avoid constipation by drinking plenty of fluids and eating foods with high amount of fiber, such as fruits, vegetables, and grains.  Do not take Tylenol if you are taking prescription pain medications.  Follow up Your surgeon may want to see you in the office following your access surgery. If so, this will be arranged at the time of your surgery.  Please call us immediately for any of the following conditions:  Increased pain, redness, drainage (pus) from your incision site Fever of 101 degrees or higher Severe or worsening pain at your incision site Hand pain or numbness.  Reduce your risk of vascular disease:  Stop smoking. If you would like help, call [facility] at 1-800-QUIT-NOW 708-817-7794) or [place] at [place] your cholesterol Maintain a desired weight Control your diabetes Keep your blood pressure down  Dialysis  It will take several weeks to several months for your new dialysis access to be ready for use. Your surgeon will determine when it is okay to use it. Your nephrologist will continue to direct your dialysis. You can continue to use your Permcath until your new access is ready for use.   07/26/2019 Ryan Wells [id number] 04/11/1956  Surgeon(s): Angelia Mould, MD  Procedure(s): Bascilic Vein Transposition Ligation Of BrachioCephalic  Fistula  x Do not stick fistula for 12 weeks    If you have any questions, please call the office at [phone].

## 2019-07-27 DIAGNOSIS — N186 End stage renal disease: Secondary | ICD-10-CM | POA: Diagnosis not present

## 2019-07-27 DIAGNOSIS — Z992 Dependence on renal dialysis: Secondary | ICD-10-CM | POA: Diagnosis not present

## 2019-07-27 DIAGNOSIS — N2581 Secondary hyperparathyroidism of renal origin: Secondary | ICD-10-CM | POA: Diagnosis not present

## 2019-07-29 DIAGNOSIS — N2581 Secondary hyperparathyroidism of renal origin: Secondary | ICD-10-CM | POA: Diagnosis not present

## 2019-07-29 DIAGNOSIS — Z992 Dependence on renal dialysis: Secondary | ICD-10-CM | POA: Diagnosis not present

## 2019-07-29 DIAGNOSIS — N186 End stage renal disease: Secondary | ICD-10-CM | POA: Diagnosis not present

## 2019-07-30 ENCOUNTER — Ambulatory Visit (INDEPENDENT_AMBULATORY_CARE_PROVIDER_SITE_OTHER): Payer: Medicare HMO | Admitting: Cardiology

## 2019-07-30 ENCOUNTER — Encounter: Payer: Self-pay | Admitting: Cardiology

## 2019-07-30 ENCOUNTER — Ambulatory Visit: Payer: Medicare HMO | Admitting: Cardiology

## 2019-07-30 ENCOUNTER — Other Ambulatory Visit: Payer: Self-pay

## 2019-07-30 VITALS — BP 107/65 | HR 70 | Ht 62.0 in | Wt 139.0 lb

## 2019-07-30 DIAGNOSIS — Z992 Dependence on renal dialysis: Secondary | ICD-10-CM | POA: Diagnosis not present

## 2019-07-30 DIAGNOSIS — Z01818 Encounter for other preprocedural examination: Secondary | ICD-10-CM | POA: Diagnosis not present

## 2019-07-30 DIAGNOSIS — N186 End stage renal disease: Secondary | ICD-10-CM | POA: Diagnosis not present

## 2019-07-30 DIAGNOSIS — Z7189 Other specified counseling: Secondary | ICD-10-CM

## 2019-07-30 DIAGNOSIS — E118 Type 2 diabetes mellitus with unspecified complications: Secondary | ICD-10-CM

## 2019-07-30 DIAGNOSIS — R9431 Abnormal electrocardiogram [ECG] [EKG]: Secondary | ICD-10-CM | POA: Diagnosis not present

## 2019-07-30 NOTE — Patient Instructions (Signed)
Medication Instructions:  No Changes *If you need a refill on your cardiac medications before your next appointment, please call your pharmacy*  Lab Work: None  Testing/Procedures: Your physician has requested that you have a stress echocardiogram. For further information please visit [email or web address]. Please follow instruction sheet as given.  [place] 3 days prior to stress test [place]: At [employer], you and your health needs are our priority.  As part of our continuing mission to provide you with exceptional heart care, we have created designated Provider Care Teams.  These Care Teams include your primary Cardiologist (physician) and Advanced Practice Providers (APPs -  Physician Assistants and Nurse Practitioners) who all work together to provide you with the care you need, when you need it.  Your next appointment:   Follow up as needed  The format for your next appointment:   Either In Person or Virtual  Provider:   Dr. Harrell Gave

## 2019-07-30 NOTE — Progress Notes (Signed)
Cardiology Office Note:    Date:  07/30/2019   ID:  Ryan Wells, DOB 1955-09-11, MRN [id number]  PCP:  Mack Hook, MD  Cardiologist:  Buford Dresser, MD  Referring MD: Rexene Agent, MD   CC: new patient evaluation for preoperative evaluation for renal transplant  History of Present Illness:    Ryan Wells is a 63 y.o. 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  Records received and reviewed from Dr. Brett Albino office. The patient is undergoing kidney transplant evaluation and is currently on the waiting list. Per request, he needs a preoperative cardiovascular evaluation, baseline and stress echo.  Patient speaks spanish, interpreter was present throughout interview and exam. Interpreter was [facility] contractor [employer].   Preoperative cardiovascular evaluation  Planned surgery: renal transplant, TBD  Pertinent past cardiac history: told age age 58 his heart was somewhat enlarged. Had a test done in the capital in [place], had an exam (?cath) that involved a "camera" that when in through his arm. Was told they were looking for a murmur. Unclear what the results were, but he was told he would only live until 88. However, has lived without symptoms since then. Reports that test was very painful, required shots for pain, thinks he would have jumped out of the bed if he could.  Prior cardiac workup: as above. Also had echo 2015, reviewed.  History of valve disease: no clear, though question prior history History of CAD/PAD/CVA/TIA: none History of heart failure: none History of arrhythmia: none On anticoagulation: no Additional history (hypertension, diabetes/on insulin, CKD/current creatinine, OSA, anesthesia  complications): Current symptoms: none Functional capacity: greater than 4 METs, climbs stairs, does ADLs. No intentional exercise but not limited in what he wants to do.  Family history: mother passed away at home because her heart stopped beating while she was sleeping, was >83 years old. Dad had diabetes but no heart problems.  Denies chest pain, shortness of breath at rest or with normal exertion. No PND, orthopnea, or unexpected weight gain. No syncope or palpitations. Two months ago had swelling in arms and legs, but went away after starting dialysis. No issues with dialysis.  Past Medical History:  Diagnosis Date  . 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 AAA (abdominal aortic aneurysm) ([place])    Not noted on CT abd 2019  . Chest pain 11/2013   Normal Echo/ EKG/enzymes-hospitalized.  Did not get outpatient stress testing following hospitalization.  No chest pain since  . Chronic kidney disease   . Diabetes mellitus without complication ([place])   . Diabetes type 2, uncontrolled ([place]) 11/25/2011   no meds, diet controlled per patient  . Diabetic gastroparesis ([place]) 01/22/2017  . Hypertension    no meds  . Microalbuminuria 01/19/2017  . Myocardial infarction [facility] 2015    Past Surgical History:  Procedure Laterality Date  . A/V FISTULAGRAM Left 06/04/2019   Procedure: A/V FISTULAGRAM;  Surgeon: Angelia Mould, MD;  Location: [place] [facility];  Service: Cardiovascular;  Laterality: Left;  . AV FISTULA PLACEMENT Left 07/02/2018   Procedure: BRACHIOCEPHALIC ARTERIOVENOUS (AV) FISTULA CREATION LEFT ARM;  Surgeon: Serafina Mitchell, MD;  Location: [place];  Service: Vascular;  Laterality: Left;  . BASCILIC VEIN TRANSPOSITION Left 07/26/2019  Procedure: Bascilic Vein Transposition;  Surgeon: Angelia Mould, MD;  Location: [facility];  Service: Vascular;  Laterality: Left;  . COLONOSCOPY    . EMBOLIZATION Left 06/04/2019   Procedure: EMBOLIZATION;  Surgeon: Angelia Mould, MD;  Location: [facility];  Service: Cardiovascular;  Laterality: Left;  LT ARM FISTULA/COMPETING BRANCH  . EYE SURGERY Bilateral    cataracts x2  . EYE SURGERY    . INSERTION OF DIALYSIS CATHETER Right 06/08/2019   Procedure: INSERTION OF PALINDROME DIALYSIS CATHETER IN RIGHT INTERNAL JUGULAR;  Surgeon: Angelia Mould, MD;  Location: [place];  Service: Vascular;  Laterality: Right;  . LIGATION OF ARTERIOVENOUS  FISTULA Left 07/26/2019   Procedure: Ligation Of BrachioCephalic  Fistula;  Surgeon: Angelia Mould, MD;  Location: [facility];  Service: Vascular;  Laterality: Left;  . NECK SURGERY    . PERIPHERAL VASCULAR BALLOON ANGIOPLASTY Left 06/04/2019   Procedure: PERIPHERAL VASCULAR BALLOON ANGIOPLASTY;  Surgeon: Angelia Mould, MD;  Location: [facility];  Service: Cardiovascular;  Laterality: Left;  ARM FISTULA    Current Medications: Current Outpatient Medications on File Prior to Visit  Medication Sig  . cholecalciferol (VITAMIN D3) 25 MCG (1000 UT) tablet Take 2,000 Units by mouth daily.   No current facility-administered medications on file prior to visit.     Allergies:   Patient has no known allergies.   Social History   Tobacco Use  . Smoking status: Never Smoker  . Smokeless tobacco: Never Used  Substance Use Topics  . Alcohol use: Never  . Drug use: Never    Family History: family history includes Diabetes in his brother, father, and sister; Heart disease in his mother; Kidney disease in his father.  ROS:   Please see the history of present illness.  Additional pertinent ROS: Constitutional: Negative for chills, fever, night sweats, unintentional weight loss  HENT: Negative for ear pain and hearing loss.   Eyes: Negative for loss of vision and eye pain.  Respiratory: Negative for cough, sputum, wheezing.   Cardiovascular: See HPI. Gastrointestinal: Negative for abdominal pain, melena, and hematochezia.   Genitourinary: Negative for dysuria and hematuria.  Musculoskeletal: Negative for falls and myalgias.  Skin: Negative for itching and rash.  Neurological: Negative for focal weakness, focal sensory changes and loss of consciousness.  Endo/Heme/Allergies: Does not bruise/bleed easily.     EKGs/Labs/Other Studies Reviewed:    The following studies were reviewed today: Echo 11/28/2013 Left ventricle: The cavity size was normal. Wall thickness  was normal. Systolic function was normal. The estimated  ejection fraction was in the range of 60% to 65%. Wall  motion was normal; there were no regional wall motion  abnormalities. Left ventricular diastolic function  parameters were normal.    EKG:  EKG is personally reviewed.  The ekg ordered today demonstrates sinus rhythm at 71 bpm, early R transition, nonspecific ST-T changes in I, aVL changed from 06/04/19.  Recent Labs: 06/04/2019: ALT 11 06/09/2019: Platelets 157 07/26/2019: BUN 57; Creatinine, Ser 7.70; Hemoglobin 12.6; Potassium 5.6; Sodium 138  Recent Lipid Panel    Component Value Date/Time   CHOL 248 (H) 07/12/2016 1101   TRIG 180 (H) 07/12/2016 1101   HDL 54 07/12/2016 1101   CHOLHDL 4.2 11/27/2013 1709   VLDL 25 11/27/2013 1709   LDLCALC 158 (H) 07/12/2016 1101    Physical Exam:    VS:  BP 107/65   Pulse 70   Ht 5\' 2"  (1.575 m)  Wt 139 lb (63 kg)   SpO2 98%   BMI 25.42 kg/m     Wt Readings from Last 3 Encounters:  07/30/19 139 lb (63 kg)  07/26/19 138 lb (62.6 kg)  07/21/19 137 lb (62.1 kg)    GEN: Well nourished, well developed in no acute distress HEENT: Normal, moist mucous membranes NECK: No JVD CARDIAC: regular rhythm, normal S1 and S2, no rubs or gallops. No murmurs. VASCULAR: Radial and DP pulses 2+ bilaterally. No carotid bruits RESPIRATORY:  Clear to auscultation without rales, wheezing or rhonchi  ABDOMEN: Soft, non-tender, non-distended MUSCULOSKELETAL:  Ambulates independently SKIN: Warm  and dry, no edema NEUROLOGIC:  Alert and oriented x 3. No focal neuro deficits noted. PSYCHIATRIC:  Normal affect    ASSESSMENT:    1. Pre-op evaluation   2. ESRD (end stage renal disease) on dialysis ([place])   3. Type 2 diabetes mellitus with complication, without long-term current use of insulin ([place])   4. Abnormal ECG   5. Cardiac risk counseling   6. Counseling on health promotion and disease prevention    PLAN:    Preoperative cardiovascular evaluation for renal transplant: ESRD 2/2 complications from type II diabetes -denies any active symptoms -does have abnormal ECG, changed from 05/2019 -renal transplant team has requested stress echo. He does feel that he can treadmill. ECG acceptable for this study. Ordered today -rest of workup as per transplant team  Cardiac risk counseling and prevention recommendations: -recommend heart healthy/Mediterranean diet, with whole grains, fruits, vegetable, fish, lean meats, nuts, and olive oil. Limit salt. -recommend moderate walking, 3-5 times/week for 30-50 minutes each session. Aim for at least 150 minutes.week. Goal should be pace of 3 miles/hours, or walking 1.5 miles in 30 minutes -recommend avoidance of tobacco products. Avoid excess alcohol. -benefit of primary prevention statins unclear in end stage renal disease  Plan for follow up: as needed/requested by patient or transplant team  Medication Adjustments/Labs and Tests Ordered: Current medicines are reviewed at length with the patient today.  Concerns regarding medicines are outlined above.  Orders Placed This Encounter  Procedures  . EKG 12-Lead  . ECHOCARDIOGRAM STRESS TEST   No orders of the defined types were placed in this encounter.   Patient Instructions  Medication Instructions:  No Changes *If you need a refill on your cardiac medications before your next appointment, please call your pharmacy*  Lab Work: None  Testing/Procedures: Your physician has  requested that you have a stress echocardiogram. For further information please visit [email or web address]. Please follow instruction sheet as given.  [place] 3 days prior to stress test [place]: At [facility], you and your health needs are our priority.  As part of our continuing mission to provide you with exceptional heart care, we have created designated Provider Care Teams.  These Care Teams include your primary Cardiologist (physician) and Advanced Practice Providers (APPs -  Physician Assistants and Nurse Practitioners) who all work together to provide you with the care you need, when you need it.  Your next appointment:   Follow up as needed  The format for your next appointment:   Either In Person or Virtual  Provider:   Dr. Harrell Gave      Signed, Buford Dresser, MD PhD 07/30/2019 7:57 PM    [place]

## 2019-07-31 DIAGNOSIS — N186 End stage renal disease: Secondary | ICD-10-CM | POA: Diagnosis not present

## 2019-07-31 DIAGNOSIS — N2581 Secondary hyperparathyroidism of renal origin: Secondary | ICD-10-CM | POA: Diagnosis not present

## 2019-07-31 DIAGNOSIS — Z992 Dependence on renal dialysis: Secondary | ICD-10-CM | POA: Diagnosis not present

## 2019-08-03 DIAGNOSIS — N2581 Secondary hyperparathyroidism of renal origin: Secondary | ICD-10-CM | POA: Diagnosis not present

## 2019-08-03 DIAGNOSIS — Z992 Dependence on renal dialysis: Secondary | ICD-10-CM | POA: Diagnosis not present

## 2019-08-03 DIAGNOSIS — N186 End stage renal disease: Secondary | ICD-10-CM | POA: Diagnosis not present

## 2019-08-05 DIAGNOSIS — N186 End stage renal disease: Secondary | ICD-10-CM | POA: Diagnosis not present

## 2019-08-05 DIAGNOSIS — Z992 Dependence on renal dialysis: Secondary | ICD-10-CM | POA: Diagnosis not present

## 2019-08-05 DIAGNOSIS — N2581 Secondary hyperparathyroidism of renal origin: Secondary | ICD-10-CM | POA: Diagnosis not present

## 2019-08-08 DIAGNOSIS — N2581 Secondary hyperparathyroidism of renal origin: Secondary | ICD-10-CM | POA: Diagnosis not present

## 2019-08-08 DIAGNOSIS — N186 End stage renal disease: Secondary | ICD-10-CM | POA: Diagnosis not present

## 2019-08-08 DIAGNOSIS — Z992 Dependence on renal dialysis: Secondary | ICD-10-CM | POA: Diagnosis not present

## 2019-08-10 DIAGNOSIS — N186 End stage renal disease: Secondary | ICD-10-CM | POA: Diagnosis not present

## 2019-08-10 DIAGNOSIS — Z992 Dependence on renal dialysis: Secondary | ICD-10-CM | POA: Diagnosis not present

## 2019-08-10 DIAGNOSIS — N2581 Secondary hyperparathyroidism of renal origin: Secondary | ICD-10-CM | POA: Diagnosis not present

## 2019-08-12 DIAGNOSIS — N186 End stage renal disease: Secondary | ICD-10-CM | POA: Diagnosis not present

## 2019-08-12 DIAGNOSIS — N2581 Secondary hyperparathyroidism of renal origin: Secondary | ICD-10-CM | POA: Diagnosis not present

## 2019-08-12 DIAGNOSIS — Z992 Dependence on renal dialysis: Secondary | ICD-10-CM | POA: Diagnosis not present

## 2019-08-18 ENCOUNTER — Telehealth (HOSPITAL_COMMUNITY): Payer: Self-pay | Admitting: *Deleted

## 2019-08-18 NOTE — Telephone Encounter (Signed)
Patient given detailed instructions, through the interpreter line, per Stress Test Requisition Sheet for test on 08/23/19 at 2:00.Patient Notified to arrive 30 minutes early, and that it is imperative to arrive on time for appointment to keep from having the test rescheduled.  Patient verbalized understanding. Ryan Wells

## 2019-08-19 ENCOUNTER — Other Ambulatory Visit (HOSPITAL_COMMUNITY)
Admission: RE | Admit: 2019-08-19 | Discharge: 2019-08-19 | Disposition: A | Discharge status: Home / Self Care | Payer: Medicare Other | Source: Ambulatory Visit | Attending: Cardiology | Admitting: Cardiology

## 2019-08-19 DIAGNOSIS — Z01812 Encounter for preprocedural laboratory examination: Secondary | ICD-10-CM | POA: Insufficient documentation

## 2019-08-19 DIAGNOSIS — U071 COVID-19: Secondary | ICD-10-CM | POA: Diagnosis not present

## 2019-08-21 ENCOUNTER — Telehealth: Payer: Self-pay | Admitting: Physician Assistant

## 2019-08-21 LAB — NOVEL CORONAVIRUS, NAA (HOSP ORDER, SEND-OUT TO REF LAB; TAT 18-24 HRS): SARS-CoV-2, NAA: DETECTED — AB

## 2019-08-21 NOTE — Telephone Encounter (Signed)
Received a message that the patient's COVID test on 08/19/19 resulted positive. He is scheduled for OP myoview on 08/23/19. Using the interpreter line, I was able to speak with the patient via his weife. I informed them of the negative test. I explained that he should quarantine for 14 days. He lives with his wife and three sons. I recommended staying in one room of the house and wearing a mask when around other family members. He is scheduled for HD today. I recommended calling his HD center and asking for their protocol on how he should proceed with HD. She expressed understanding.

## 2019-08-21 NOTE — Progress Notes (Signed)
Notified Angie Duke, PA that patient had a + Covid test on 08/19/2019, she is scheduled for stress test 08/23/19.  She will notify patient and office.

## 2019-08-23 ENCOUNTER — Other Ambulatory Visit (HOSPITAL_COMMUNITY): Payer: Medicare HMO

## 2019-08-23 ENCOUNTER — Other Ambulatory Visit (HOSPITAL_COMMUNITY): Payer: Medicare Other

## 2019-09-07 ENCOUNTER — Other Ambulatory Visit: Payer: Self-pay

## 2019-09-07 DIAGNOSIS — Z992 Dependence on renal dialysis: Secondary | ICD-10-CM

## 2019-09-07 DIAGNOSIS — N186 End stage renal disease: Secondary | ICD-10-CM

## 2019-09-08 ENCOUNTER — Encounter (HOSPITAL_COMMUNITY): Payer: Medicare HMO

## 2019-09-16 ENCOUNTER — Telehealth (HOSPITAL_COMMUNITY): Payer: Self-pay | Admitting: *Deleted

## 2019-09-16 NOTE — Telephone Encounter (Signed)
Patient's son given given detailed instructions ( with Patient's permission) for Stress Echo on 09/20/19.  Kirstie Peri

## 2019-09-20 ENCOUNTER — Other Ambulatory Visit (HOSPITAL_COMMUNITY): Payer: Medicare Other

## 2019-09-29 ENCOUNTER — Telehealth (HOSPITAL_COMMUNITY): Payer: Self-pay | Admitting: *Deleted

## 2019-09-29 NOTE — Telephone Encounter (Signed)
Attempted both numbers given to go over instructions for upcoming stress echocardiogram.  Neither phone number has been set up to accept voice mail.  Could not leave a message.

## 2019-10-04 ENCOUNTER — Encounter (HOSPITAL_COMMUNITY): Payer: Medicare Other

## 2019-10-04 ENCOUNTER — Ambulatory Visit (HOSPITAL_COMMUNITY): Payer: Medicare Other | Attending: Cardiovascular Disease

## 2019-10-04 ENCOUNTER — Other Ambulatory Visit: Payer: Self-pay

## 2019-10-04 DIAGNOSIS — Z01818 Encounter for other preprocedural examination: Secondary | ICD-10-CM

## 2019-10-04 DIAGNOSIS — Z01810 Encounter for preprocedural cardiovascular examination: Secondary | ICD-10-CM | POA: Diagnosis not present

## 2019-10-13 ENCOUNTER — Encounter: Payer: Self-pay | Admitting: Vascular Surgery

## 2019-10-13 ENCOUNTER — Other Ambulatory Visit: Payer: Self-pay

## 2019-10-13 ENCOUNTER — Ambulatory Visit (INDEPENDENT_AMBULATORY_CARE_PROVIDER_SITE_OTHER): Payer: Self-pay | Admitting: Vascular Surgery

## 2019-10-13 ENCOUNTER — Inpatient Hospital Stay (HOSPITAL_COMMUNITY): Admission: RE | Admit: 2019-10-13 | Payer: Medicare Other | Source: Ambulatory Visit

## 2019-10-13 ENCOUNTER — Ambulatory Visit (HOSPITAL_COMMUNITY)
Admission: RE | Admit: 2019-10-13 | Discharge: 2019-10-13 | Disposition: A | Discharge status: Home / Self Care | Payer: Medicare Other | Source: Ambulatory Visit | Attending: Vascular Surgery | Admitting: Vascular Surgery

## 2019-10-13 ENCOUNTER — Other Ambulatory Visit: Payer: Self-pay | Admitting: *Deleted

## 2019-10-13 VITALS — BP 121/74 | HR 67 | Temp 97.8°F | Resp 20 | Ht 62.0 in | Wt 139.0 lb

## 2019-10-13 DIAGNOSIS — N186 End stage renal disease: Secondary | ICD-10-CM

## 2019-10-13 DIAGNOSIS — Z992 Dependence on renal dialysis: Secondary | ICD-10-CM

## 2019-10-13 NOTE — H&P (View-Only) (Signed)
   Patient name: Ryan Wells MRN: [id number] DOB: 1956-07-25 Sex: male  REASON FOR VISIT:   Follow-up after AV fistula  HPI:   Ryan Wells is a pleasant 64 y.o. male with history of end-stage renal disease.  He dialyzes on Tuesdays Thursdays and Saturdays.  He has a functioning right IJ tunneled dialysis catheter.  He had a left brachiocephalic fistula which occluded.  On 07/26/2019 he underwent ligation of his left brachiocephalic fistula as the proximal segment was pulsatile.  In addition I performed a left basilic vein transposition.  Comes in for 6-week follow-up visit.  The history is obtained through the translator.  The patient denies any pain or paresthesias in the left arm.  He dialyzes on Tuesdays Thursdays and Saturdays with his tunneled dialysis catheter.  Current Outpatient Medications  Medication Sig Dispense Refill  . cholecalciferol (VITAMIN D3) 25 MCG (1000 UT) tablet Take 2,000 Units by mouth daily.    . TUMS E-X 750 750 MG chewable tablet Chew 2 tablets by mouth at bedtime.    . VELPHORO 500 MG chewable tablet Chew 1,000 mg by mouth 3 (three) times daily.     No current facility-administered medications for this visit.    REVIEW OF SYSTEMS:  [X]  denotes positive finding, [ ]  denotes negative finding Vascular    Leg swelling    Cardiac    Chest pain or chest pressure:    Shortness of breath upon exertion:    Short of breath when lying flat:    Irregular heart rhythm:    Constitutional    Fever or chills:     PHYSICAL EXAM:   Vitals:   10/13/19 1004  BP: 121/74  Pulse: 67  Resp: 20  Temp: 97.8 F (36.6 C)  SpO2: 100%  Weight: 139 lb (63 kg)  Height: 5\' 2"  (1.575 m)    GENERAL: The patient is a well-nourished male, in no acute distress. The vital signs are documented above. CARDIOVASCULAR: There is a regular rate and rhythm. PULMONARY: There is good air exchange bilaterally without wheezing or rales. VASCULAR: His fistula is pulsatile  suggesting an outflow obstruction.  DATA:   DUPLEX AV FISTULA: I have independently interpreted the duplex of his AV fistula.  The diameters of the fistula ranged from 0.6-0.8 cm.  There are some elevated velocities in the distal upper arm noted.  There is an area where there appears to be a retained valve in the mid upper arm.  MEDICAL ISSUES:   END-STAGE RENAL DISEASE: The patient is now 2-1/2 months out from his basilic vein transposition.  This appears to be maturing with respect to size but it is pulsatile suggesting an outflow obstruction.  For this reason I recommended that we proceed with a fistulogram.  I have discussed the indications for the procedure and the potential complications through the translator.  He is agreeable to proceed.  This has been scheduled for 10/15/2019.  All of his questions were answered.  [name] [facility] [phone]

## 2019-10-13 NOTE — Progress Notes (Signed)
Pt tested positive for covid on 08/21/19. Pt will not need re-testing prior to surgery per anesthesia guidelines.   Jacqlyn Larsen, RN

## 2019-10-13 NOTE — Progress Notes (Signed)
   Patient name: Ryan Wells MRN: [id number] DOB: 09-03-1955 Sex: male  REASON FOR VISIT:   Follow-up after AV fistula  HPI:   Ryan Wells is a pleasant 64 y.o. male with history of end-stage renal disease.  He dialyzes on Tuesdays Thursdays and Saturdays.  He has a functioning right IJ tunneled dialysis catheter.  He had a left brachiocephalic fistula which occluded.  On 07/26/2019 he underwent ligation of his left brachiocephalic fistula as the proximal segment was pulsatile.  In addition I performed a left basilic vein transposition.  Comes in for 6-week follow-up visit.  The history is obtained through the translator.  The patient denies any pain or paresthesias in the left arm.  He dialyzes on Tuesdays Thursdays and Saturdays with his tunneled dialysis catheter.  Current Outpatient Medications  Medication Sig Dispense Refill  . cholecalciferol (VITAMIN D3) 25 MCG (1000 UT) tablet Take 2,000 Units by mouth daily.    . TUMS E-X 750 750 MG chewable tablet Chew 2 tablets by mouth at bedtime.    . VELPHORO 500 MG chewable tablet Chew 1,000 mg by mouth 3 (three) times daily.     No current facility-administered medications for this visit.    REVIEW OF SYSTEMS:  [X]  denotes positive finding, [ ]  denotes negative finding Vascular    Leg swelling    Cardiac    Chest pain or chest pressure:    Shortness of breath upon exertion:    Short of breath when lying flat:    Irregular heart rhythm:    Constitutional    Fever or chills:     PHYSICAL EXAM:   Vitals:   10/13/19 1004  BP: 121/74  Pulse: 67  Resp: 20  Temp: 97.8 F (36.6 C)  SpO2: 100%  Weight: 139 lb (63 kg)  Height: 5\' 2"  (1.575 m)    GENERAL: The patient is a well-nourished male, in no acute distress. The vital signs are documented above. CARDIOVASCULAR: There is a regular rate and rhythm. PULMONARY: There is good air exchange bilaterally without wheezing or rales. VASCULAR: His fistula is pulsatile  suggesting an outflow obstruction.  DATA:   DUPLEX AV FISTULA: I have independently interpreted the duplex of his AV fistula.  The diameters of the fistula ranged from 0.6-0.8 cm.  There are some elevated velocities in the distal upper arm noted.  There is an area where there appears to be a retained valve in the mid upper arm.  MEDICAL ISSUES:   END-STAGE RENAL DISEASE: The patient is now 2-1/2 months out from his basilic vein transposition.  This appears to be maturing with respect to size but it is pulsatile suggesting an outflow obstruction.  For this reason I recommended that we proceed with a fistulogram.  I have discussed the indications for the procedure and the potential complications through the translator.  He is agreeable to proceed.  This has been scheduled for 10/15/2019.  All of his questions were answered.  [name] [facility] [place] [phone]

## 2019-10-15 ENCOUNTER — Ambulatory Visit (HOSPITAL_COMMUNITY)
Admission: RE | Admit: 2019-10-15 | Discharge: 2019-10-15 | Disposition: A | Discharge status: Home / Self Care | Payer: Medicare Other | Source: Ambulatory Visit | Attending: Vascular Surgery | Admitting: Vascular Surgery

## 2019-10-15 ENCOUNTER — Encounter (HOSPITAL_COMMUNITY)
Admission: RE | Disposition: A | Discharge status: Home / Self Care | Payer: Self-pay | Source: Ambulatory Visit | Attending: Vascular Surgery

## 2019-10-15 ENCOUNTER — Other Ambulatory Visit: Payer: Self-pay

## 2019-10-15 DIAGNOSIS — Z8616 Personal history of COVID-19: Secondary | ICD-10-CM | POA: Diagnosis not present

## 2019-10-15 DIAGNOSIS — N186 End stage renal disease: Secondary | ICD-10-CM | POA: Insufficient documentation

## 2019-10-15 DIAGNOSIS — Y841 Kidney dialysis as the cause of abnormal reaction of the patient, or of later complication, without mention of misadventure at the time of the procedure: Secondary | ICD-10-CM | POA: Insufficient documentation

## 2019-10-15 DIAGNOSIS — Z992 Dependence on renal dialysis: Secondary | ICD-10-CM | POA: Diagnosis not present

## 2019-10-15 DIAGNOSIS — T82898A Other specified complication of vascular prosthetic devices, implants and grafts, initial encounter: Secondary | ICD-10-CM

## 2019-10-15 DIAGNOSIS — T82858A Stenosis of vascular prosthetic devices, implants and grafts, initial encounter: Secondary | ICD-10-CM | POA: Insufficient documentation

## 2019-10-15 HISTORY — PX: PERIPHERAL VASCULAR BALLOON ANGIOPLASTY: CATH118281

## 2019-10-15 HISTORY — PX: A/V FISTULAGRAM: CATH118298

## 2019-10-15 LAB — POCT I-STAT, CHEM 8
BUN: 38 mg/dL — ABNORMAL HIGH (ref 8–23)
Calcium, Ion: 0.96 mmol/L — ABNORMAL LOW (ref 1.15–1.40)
Chloride: 94 mmol/L — ABNORMAL LOW (ref 98–111)
Creatinine, Ser: 6.5 mg/dL — ABNORMAL HIGH (ref 0.61–1.24)
Glucose, Bld: 84 mg/dL (ref 70–99)
HCT: 33 % — ABNORMAL LOW (ref 39.0–52.0)
Hemoglobin: 11.2 g/dL — ABNORMAL LOW (ref 13.0–17.0)
Potassium: 5.7 mmol/L — ABNORMAL HIGH (ref 3.5–5.1)
Sodium: 133 mmol/L — ABNORMAL LOW (ref 135–145)
TCO2: 36 mmol/L — ABNORMAL HIGH (ref 22–32)

## 2019-10-15 LAB — GLUCOSE, CAPILLARY: Glucose-Capillary: 86 mg/dL (ref 70–99)

## 2019-10-15 SURGERY — A/V FISTULAGRAM
Anesthesia: LOCAL | Laterality: Left

## 2019-10-15 MED ORDER — HEPARIN (PORCINE) IN NACL 1000-0.9 UT/500ML-% IV SOLN
INTRAVENOUS | Status: DC | PRN
  Administered 2019-10-15: 500 mL

## 2019-10-15 MED ORDER — SODIUM CHLORIDE 0.9% FLUSH: 3.0000 mL | Freq: Two times a day (BID) | INTRAVENOUS | Status: DC

## 2019-10-15 MED ORDER — SODIUM CHLORIDE 0.9 % IV SOLN: 250.0000 mL | INTRAVENOUS | Status: DC | PRN

## 2019-10-15 MED ORDER — HEPARIN SODIUM (PORCINE) 1000 UNIT/ML IJ SOLN
INTRAMUSCULAR | Status: AC
  Filled 2019-10-15: qty 1

## 2019-10-15 MED ORDER — HEPARIN SODIUM (PORCINE) 1000 UNIT/ML IJ SOLN
INTRAMUSCULAR | Status: DC | PRN
  Administered 2019-10-15: 2000 [IU] via INTRAVENOUS

## 2019-10-15 MED ORDER — SODIUM CHLORIDE 0.9% FLUSH: 3.0000 mL | INTRAVENOUS | Status: DC | PRN

## 2019-10-15 MED ORDER — LIDOCAINE HCL (PF) 1 % IJ SOLN
INTRAMUSCULAR | Status: DC | PRN
  Administered 2019-10-15: 2 mL

## 2019-10-15 MED ORDER — HEPARIN (PORCINE) IN NACL 1000-0.9 UT/500ML-% IV SOLN
INTRAVENOUS | Status: AC
  Filled 2019-10-15: qty 500

## 2019-10-15 MED ORDER — IODIXANOL 320 MG/ML IV SOLN
INTRAVENOUS | Status: DC | PRN
  Administered 2019-10-15: 30 mL

## 2019-10-15 MED ORDER — LIDOCAINE HCL (PF) 1 % IJ SOLN
INTRAMUSCULAR | Status: AC
  Filled 2019-10-15: qty 30

## 2019-10-15 SURGICAL SUPPLY — 18 items
BAG SNAP BAND KOVER 36X36 (MISCELLANEOUS) ×2 IMPLANT
BALLN MUSTANG 6.0X20 75 (BALLOONS) ×2
BALLN MUSTANG 8.0X40 75 (BALLOONS) ×2
BALLN MUSTANG 9X20X75 (BALLOONS) ×2
BALLOON MUSTANG 6.0X20 75 (BALLOONS) IMPLANT
BALLOON MUSTANG 8.0X40 75 (BALLOONS) IMPLANT
BALLOON MUSTANG 9X20X75 (BALLOONS) IMPLANT
COVER DOME SNAP 22 D (MISCELLANEOUS) ×2 IMPLANT
KIT ENCORE 26 ADVANTAGE (KITS) ×1 IMPLANT
KIT MICROPUNCTURE NIT STIFF (SHEATH) ×1 IMPLANT
PROTECTION STATION PRESSURIZED (MISCELLANEOUS) ×2
SHEATH PINNACLE R/O II 6F 4CM (SHEATH) ×1 IMPLANT
SHEATH PROBE COVER 6X72 (BAG) ×2 IMPLANT
STATION PROTECTION PRESSURIZED (MISCELLANEOUS) ×1 IMPLANT
STOPCOCK MORSE 400PSI 3WAY (MISCELLANEOUS) ×2 IMPLANT
TRAY PV CATH (CUSTOM PROCEDURE TRAY) ×2 IMPLANT
TUBING CIL FLEX 10 FLL-RA (TUBING) ×2 IMPLANT
WIRE HITORQ VERSACORE ST 145CM (WIRE) ×1 IMPLANT

## 2019-10-15 NOTE — Discharge Instructions (Signed)
Dialysis Fistulogram, Care After This sheet gives you information about how to care for yourself after your procedure. Your health care provider may also give you more specific instructions. If you have problems or questions, contact your health care provider. What can I expect after the procedure? After the procedure, it is common to have:  A small amount of discomfort in the area where the small, thin tube (catheter) was placed for the procedure.  A small amount of bruising around the fistula.  Sleepiness and tiredness (fatigue). Follow these instructions at home: Activity   Rest at home and do not lift anything that is heavier than 5 lb (2.3 kg) on the day after your procedure.  Return to your normal activities as told by your health care provider. Ask your health care provider what activities are safe for you.  Do not drive or use heavy machinery while taking prescription pain medicine.  Do not drive for 24 hours if you were given a medicine to help you relax (sedative) during your procedure. Medicines   Take over-the-counter and prescription medicines only as told by your health care provider. Puncture site care  Follow instructions from your health care provider about how to take care of the site where catheters were inserted. Make sure you: ? Wash your hands with soap and water before you change your bandage (dressing). If soap and water are not available, use hand sanitizer. ? Change your dressing as told by your health care provider. ? Leave stitches (sutures), skin glue, or adhesive strips in place. These skin closures may need to stay in place for 2 weeks or longer. If adhesive strip edges start to loosen and curl up, you may trim the loose edges. Do not remove adhesive strips completely unless your health care provider tells you to do that.  Check your puncture area every day for signs of infection. Check for: ? Redness, swelling, or pain. ? Fluid or  blood. ? Warmth. ? Pus or a bad smell. General instructions  Do not take baths, swim, or use a hot tub until your health care provider approves. Ask your health care provider if you may take showers. You may only be allowed to take sponge baths.  Monitor your dialysis fistula closely. Check to make sure that you can feel a vibration or buzz (a thrill) when you put your fingers over the fistula.  Prevent damage to your graft or fistula: ? Do not wear tight-fitting clothing or jewelry on the arm or leg that has your graft or fistula. ? Tell all your health care providers that you have a dialysis fistula or graft. ? Do not allow blood draws, IVs, or blood pressure readings to be done in the arm that has your fistula or graft. ? Do not allow flu shots or vaccinations in the arm with your fistula or graft.  Keep all follow-up visits as told by your health care provider. This is important. Contact a health care provider if:  You have redness, swelling, or pain at the site where the catheter was put in.  You have fluid or blood coming from the catheter site.  The catheter site feels warm to the touch.  You have pus or a bad smell coming from the catheter site.  You have a fever or chills. Get help right away if:  You feel weak.  You have trouble balancing.  You have trouble moving your arms or legs.  You have problems with your speech or vision.  You   can no longer feel a vibration or buzz when you put your fingers over your dialysis fistula.  The limb that was used for the procedure: ? Swells. ? Is painful. ? Is cold. ? Is discolored, such as blue or pale white.  You have chest pain or shortness of breath. Summary  After a dialysis fistulogram, it is common to have a small amount of discomfort or bruising in the area where the small, thin tube (catheter) was placed.  Rest at home on the day after your procedure. Return to your normal activities as told by your health care  provider.  Take over-the-counter and prescription medicines only as told by your health care provider.  Follow instructions from your health care provider about how to take care of the site where the catheter was inserted.  Keep all follow-up visits as told by your health care provider. This information is not intended to replace advice given to you by your health care provider. Make sure you discuss any questions you have with your health care provider. Document Revised: 08/29/2017 Document Reviewed: 08/29/2017 Elsevier Patient Education  2020 [place] de dilisis, cuidados posteriores Dialysis Fistulogram, Care After [employer] proporciona informacin sobre cmo cuidarse despus del procedimiento. Su mdico tambin podr darle instrucciones ms especficas. Comunquese con su mdico si tiene problemas o preguntas. Qu puedo esperar despus del procedimiento? Despus del procedimiento, es comn [employer] siguientes sntomas:  Una pequea molestia en la zona en la que se coloc el tubo delgado y pequeo (catter) para el procedimiento.  Un pequeo hematoma alrededor de la fstula.  Somnolencia y cansancio Company secretary). Siga estas indicaciones en su casa: Actividad   Haga reposo en su casa y no levante objetos que pesen ms de 5 lb (2,3 kg) el da despus del procedimiento.  Reanude sus actividades normales segn lo indicado por el mdico. Pregntele al mdico qu actividades son seguras para usted.  No conduzca ni use maquinaria pesada mientras toma analgsicos recetados.  No conduzca durante 24horas si recibi un medicamento para ayudarlo a relajarse (sedante) durante el procedimiento. Medicamentos   [employer] de venta libre y los recetados solamente como se lo haya indicado el mdico. Cuidado del Environmental consultant de la puncin  Siga las indicaciones de su mdico acerca de cmo Government social research officer donde se insertaron los catteres. Asegrese de hacer lo  siguiente: ? Lvese las manos con agua y jabn antes de Quarry manager las vendas (vendaje). Use desinfectante para manos si no dispone de [place]. ? Cambie el vendaje como se lo haya indicado el mdico. ? No retire los puntos (suturas), la goma para cerrar la piel o las tiras [place]. Es posible que estos cierres cutneos Animal nutritionist en la piel durante 2semanas o ms. Si los bordes de las tiras adhesivas empiezan a despegarse y Therapist, sports, puede recortar los que estn sueltos. No retire las tiras [facility] por completo a menos que el mdico se lo indique.  Controle la zona de la puncin todos los das para detectar signos de infeccin. Est atento a los siguientes signos: ? Dolor, hinchazn o enrojecimiento. ? Lquido o sangre. ? Calor. ? Pus o mal olor. Instrucciones generales  No tome baos de inmersin, no nade ni use el jacuzzi hasta que el mdico lo autorice. Pregntele al mdico si puede ducharse. Thurston Pounds solo le permitan darse baos de [place].  Controle atentamente la fstula de dilisis. Verifique para asegurarse de que puede sentir una vibracin o un zumbido (frmito)  cuando coloca los dedos [employer] fstula.  Evite daar el injerto o la fstula: ? No use ropa ajustada ni joyas en el brazo o la pierna que tiene el injerto o la fstula. ? Informe a todos sus mdicos que tiene un injerto o una fstula para dilisis. ? No permita extracciones de sangre, terapias intravenosas ni lecturas de la presin arterial en el brazo que tiene la fstula o el injerto. ? No permita que le apliquen vacunas antigripales ni otras vacunas en el brazo con la fstula o el injerto.  Concurra a todas las visitas de seguimiento como se lo haya indicado el mdico. Esto es importante. Comunquese con un mdico si:  Tiene enrojecimiento, hinchazn o dolor en el lugar donde se insert el catter.  Observa lquido o sangre que proviene del lugar de la insercin del catter.  El lugar de la insercin  del catter est caliente al tacto.  Tiene pus o percibe mal olor que proviene del lugar de la insercin del catter.  Tiene fiebre o siente escalofros. Solicite ayuda de inmediato si:  Se siente dbil.  Tiene problemas de equilibrio.  Tiene dificultad para mover los brazos o las piernas.  Tiene problemas visuales o para hablar.  Ya no puede sentir una vibracin o un zumbido cuando [employer] dedos sobre la fstula de dilisis.  La extremidad que se [place] para el procedimiento: ? Se hincha. ? Duele. ? Est fra. ? Cambia de color, por ejemplo, se torna azulada o blanco plido.  Siente falta de aire o Tourist information centre manager. Resumen  Despus de [place] fistulografa de dilisis, es comn tener una pequea molestia o algunos moretones en la zona donde se coloc el tubo delgado y pequeo (catter).  Descanse en su [place] despus del procedimiento. Reanude sus actividades normales segn lo indicado por el mdico.  [employer] de venta libre y los recetados solamente como se lo haya indicado el mdico.  Siga las indicaciones de su mdico acerca de cmo Government social research officer donde se insert el catter.  Concurra a todas las visitas de seguimiento como se lo haya indicado el mdico. Esta informacin no tiene Marine scientist el consejo del mdico. Asegrese de hacerle al mdico cualquier pregunta que tenga. Document Revised: 10/01/2017 Document Reviewed: 10/01/2017 Elsevier Patient Education  2020 [employer].

## 2019-10-15 NOTE — Op Note (Signed)
   PATIENT: Ryan Wells      MRN: [id number] DOB: 1955-10-25    DATE OF PROCEDURE: 10/15/2019  INDICATIONS:    Ryan Wells is a 64 y.o. male who presented with a pulsatile left basilic vein transposition which have been placed in December of last year.  Duplex showed a stenosis within the fistula.  He presents for a fistulogram and possible venoplasty.  PROCEDURE:    1  ultrasound-guided access to left basilic vein transposition 2 fistulogram left basilic vein transposition 3 venoplasty stenosis of basilic vein  SURGEON: Judeth Cornfield. Scot Dock, MD, FACS  ANESTHESIA: Local  EBL: Minimal  TECHNIQUE: The patient was brought to the peripheral vascular lab and the left arm was prepped and draped in usual sterile fashion.  Under ultrasound guidance, after the skin was anesthetized, I cannulated the proximal fistula with a micropuncture needle and a micropuncture sheath was introduced over a wire.  By ultrasound the vein was patent at this level.  I real-time image was sent to the server.  Fistulogram was obtained to evaluate the fistula from the point of cannulation to include the central veins.  There was a 80% stenosis in the upper aspect of the fistula.  I elected to address this with venoplasty.  A versa core wire was advanced through the stenosis and the micropuncture sheath exchanged for a short 6 French sheath.  The patient received 2000 units of IV heparin.  Initially selected a 6 mm x 2 cm balloon.  This was inflated to 24 atm for 1 minute.  Follow-up film showed residual stenosis.  I went back with an 8 mm x 4 cm balloon and inflated this to rated burst pressure for 1 minute.  There was one focal area of stenosis and therefore I went back with a short 9 x 2 balloon but was unable to fully open the focal area.  At this point the patient had an excellent thrill in the fistula and therefore I did not pursue a more aggressive approach.  A 4-0 Monocryl was placed around the  cannulation site and pressure held for hemostasis.  Patient tolerated procedure well was transferred to holding in stable condition.    FINDINGS:   #1 patent left basilic vein transposition #2 no central venous stenosis #3 the arterial anastomosis is widely patent #4  80% stenosis of fistula in upper arm successfully addressed with venoplasty as described above  CLINICAL NOTE: The fistula may be used in 2 weeks.  [name], MD, FACS [facility]  DATE OF DICTATION:   10/15/2019

## 2019-10-15 NOTE — Interval H&P Note (Signed)
History and Physical Interval Note:  10/15/2019 8:44 AM  Ryan Wells  has presented today for surgery, with the diagnosis of End stage renal.  The various methods of treatment have been discussed with the patient and family. After consideration of risks, benefits and other options for treatment, the patient has consented to  Procedure(s): A/V FISTULAGRAM (Left) as a surgical intervention.  The patient's history has been reviewed, patient examined, no change in status, stable for surgery.  I have reviewed the patient's chart and labs.  Questions were answered to the patient's satisfaction.     [name]

## 2019-11-25 ENCOUNTER — Telehealth: Payer: Self-pay | Admitting: Cardiology

## 2019-11-25 DIAGNOSIS — Z01818 Encounter for other preprocedural examination: Secondary | ICD-10-CM

## 2019-11-25 NOTE — Telephone Encounter (Signed)
Oneal Deputy is calling from Dr. Jason Nest office stating Ryan Wells is needing an Echo and Nuclear Stress Test performed in order to be put on the transplant list. Please advise.

## 2019-11-25 NOTE — Telephone Encounter (Signed)
I spoke with Dominica.  Patient had stress echo on 2/22 but this was nondiagnostic.  I gave this information to Dominica. She will check Epic for results.  Oneal Deputy reports Dr Joelyn Oms no longer follows patient. Patient now followed by Dr Justin Mend. Oneal Deputy reports patient would like to schedule tests needed for transplant. Per request from transplant team patient needs echo and nuclear stress test. Will forward to Dr Harrell Gave to see what tests to order.

## 2019-11-25 NOTE — Telephone Encounter (Signed)
He will need an echocardiogram and a nuclear stress test. Since he did not get to target heart rate on the stress echo, I would order a lexiscan nuclear stress test for pre transplant evaluation to make sure we get a definitive test. Thanks.

## 2019-11-29 ENCOUNTER — Other Ambulatory Visit: Payer: Self-pay

## 2019-11-29 ENCOUNTER — Ambulatory Visit (INDEPENDENT_AMBULATORY_CARE_PROVIDER_SITE_OTHER): Payer: Medicare Other

## 2019-11-29 ENCOUNTER — Ambulatory Visit: Payer: Managed Care, Other (non HMO) | Admitting: Podiatry

## 2019-11-29 DIAGNOSIS — E1142 Type 2 diabetes mellitus with diabetic polyneuropathy: Secondary | ICD-10-CM | POA: Diagnosis not present

## 2019-11-29 MED ORDER — GABAPENTIN 100 MG PO CAPS: 100.0000 mg | ORAL_CAPSULE | Freq: Three times a day (TID) | ORAL | 3 refills | Status: DC

## 2019-11-30 NOTE — Telephone Encounter (Signed)
Spoke with Dow Adolph and advised orders are placed but will need pt to call office to go over instructions. Message sent to scheduling to arrange testing date.  You are scheduled for a Myocardial Perfusion Imaging Study  Please arrive 15 minutes prior to your appointment time for registration and insurance purposes.  The test will take approximately 3 to 4 hours to complete; you may bring reading material.  If someone comes with you to your appointment, they will need to remain in the main lobby due to limited space in the testing area. **If you are pregnant or breastfeeding, please notify the nuclear lab prior to your appointment**  How to prepare for your Myocardial Perfusion Test: . Do not eat or drink 3 hours prior to your test, except you may have water. . Do not consume products containing caffeine (regular or decaffeinated) 12 hours prior to your test. (ex: coffee, chocolate, sodas, tea). . Do bring a list of your current medications with you.  If not listed below, you may take your medications as normal. . Do wear comfortable clothes (no dresses or overalls) and walking shoes, tennis shoes preferred (No heels or open toe shoes are allowed). . Do NOT wear cologne, perfume, aftershave, or lotions (deodorant is allowed). . If these instructions are not followed, your test will have to be rescheduled.  Por favor presntese a la direccin [place], [address] para su examen.  Si tiene [place] inquietud o pregunta sobre su cita, puede llamar al Nuclear Lab at [phone].  If you cannot keep your appointment, please provide 24 hours notification to the Nuclear Lab, to avoid a possible $50 charge to your account.

## 2019-12-01 NOTE — Progress Notes (Signed)
   HPI: 64 y.o. male with PMHx of T2DM presenting today as a new patient with a chief complaint of numbness and tingling of the bilateral plantar feet that has been gradually worsening over the past 6-8 months. Walking increases the symptoms. He has been applying an OTC cream for treatment with minimal relief. Patient is here for further evaluation and treatment.   Past Medical History:  Diagnosis Date  . 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 AAA (abdominal aortic aneurysm) ([place])    Not noted on CT abd 2019  . Chest pain 11/2013   Normal Echo/ EKG/enzymes-hospitalized.  Did not get outpatient stress testing following hospitalization.  No chest pain since  . Chronic kidney disease   . Diabetes mellitus without complication ([place])   . Diabetes type 2, uncontrolled ([place]) 11/25/2011   no meds, diet controlled per patient  . Diabetic gastroparesis ([place]) 01/22/2017  . Hypertension    no meds  . Microalbuminuria 01/19/2017  . Myocardial infarction [facility]) 2015     Physical Exam: General: The patient is alert and oriented x3 in no acute distress.  Dermatology: Skin is warm, dry and supple bilateral lower extremities. Negative for open lesions or macerations.  Vascular: Palpable pedal pulses bilaterally. No edema or erythema noted. Capillary refill within normal limits.  Neurological: Epicritic and protective threshold diminished bilaterally.   Musculoskeletal Exam: Range of motion within normal limits to all pedal and ankle joints bilateral. Muscle strength 5/5 in all groups bilateral.   Radiographic Exam:  Normal osseous mineralization. Joint spaces preserved. No fracture/dislocation/boney destruction.    Assessment: 1. CKD stage IV 2. Peripheral polyneuropathy BLE   Plan of Care:  1. Patient evaluated. X-Rays reviewed.  2. Prescription for gabapentin 100 mg 3 times daily provided to patient.  3. Recommended good shoe gear.  4. Recommended follow up with PCP.  5. Return to  clinic as needed.       Edrick Kins, DPM [facility]  Dr. Edrick Kins, DPM    [address], [place] [address]                Office [phone]  Fax [phone]

## 2019-12-08 NOTE — Telephone Encounter (Signed)
Spoke with Dow Adolph and informed we still haven't heard from pt to scheduled ECHO and stress test. Dow Adolph voiced she will have pt contact office tomorrow.

## 2019-12-09 NOTE — Telephone Encounter (Signed)
Pt has an appointment on 5/19 with Dr. Harrell Gave.

## 2019-12-29 ENCOUNTER — Telehealth (HOSPITAL_COMMUNITY): Payer: Self-pay

## 2019-12-29 ENCOUNTER — Other Ambulatory Visit: Payer: Self-pay

## 2019-12-29 ENCOUNTER — Ambulatory Visit: Payer: Medicare Other | Admitting: Cardiology

## 2019-12-29 ENCOUNTER — Encounter: Payer: Self-pay | Admitting: Cardiology

## 2019-12-29 VITALS — BP 104/58 | HR 76 | Ht 62.0 in | Wt 141.0 lb

## 2019-12-29 DIAGNOSIS — R9431 Abnormal electrocardiogram [ECG] [EKG]: Secondary | ICD-10-CM | POA: Diagnosis not present

## 2019-12-29 DIAGNOSIS — N186 End stage renal disease: Secondary | ICD-10-CM

## 2019-12-29 DIAGNOSIS — Z7189 Other specified counseling: Secondary | ICD-10-CM

## 2019-12-29 DIAGNOSIS — Z01818 Encounter for other preprocedural examination: Secondary | ICD-10-CM

## 2019-12-29 DIAGNOSIS — Z992 Dependence on renal dialysis: Secondary | ICD-10-CM

## 2019-12-29 NOTE — Telephone Encounter (Signed)
Encounter complete. 

## 2019-12-29 NOTE — Patient Instructions (Signed)
Medication Instructions:  Your Physician recommend you continue on your current medication as directed.    *If you need a refill on your cardiac medications before your next appointment, please call your pharmacy*   Lab Work: None   Testing/Procedures: Please schedule previous ordered ECHO and Lexiscan.    Follow-Up: At [facility], you and your health needs are our priority.  As part of our continuing mission to provide you with exceptional heart care, we have created designated Provider Care Teams.  These Care Teams include your primary Cardiologist (physician) and Advanced Practice Providers (APPs -  Physician Assistants and Nurse Practitioners) who all work together to provide you with the care you need, when you need it.  We recommend signing up for the patient portal called "MyChart".  Sign up information is provided on this After Visit Summary.  MyChart is used to connect with patients for Virtual Visits (Telemedicine).  Patients are able to view lab/test results, encounter notes, upcoming appointments, etc.  Non-urgent messages can be sent to your provider as well.   To learn more about what you can do with MyChart, go to [email or web address].    Your next appointment:   1 year(s)  The format for your next appointment:   In Person  Provider:   Buford Dresser, MD

## 2019-12-29 NOTE — Progress Notes (Signed)
Cardiology Office Note:    Date:  12/29/2019   ID:  Ryan Wells, DOB 1956/05/01, MRN [id number]  PCP:  Mack Hook, MD  Cardiologist:  Buford Dresser, MD  Referring MD: Mack Hook, MD   CC: follow up  History of Present Illness:    Ryan Wells is a 64 y.o. 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 He was seen 07/30/19 as a new consult at the request of Mack Hook, MD for the evaluation and management of pretransplant cardiovascular examination.  Pertinent past cardiac history: told age age 39 his heart was somewhat enlarged. Had a test done in the capital in [place], had an exam (?cath) that involved a "camera" that when in through his arm. Was told they were looking for a murmur. Unclear what the results were, but he was told he would only live until 24. However, has lived without symptoms since then. Reports that test was very painful, required shots for pain, thinks he would have jumped out of the bed if he could.  Family history: mother passed away at home because her heart stopped beating while she was sleeping, was >54 years old. Dad had diabetes but no heart problems.  Today: Unable to achieve target heart rate on echo stress test. His transplant team is asking for a normal echocardiogram and alternative stress test. As he did not achieve target heart rate on treadmill, recommended for lexiscan nuclear stress test. These orders have been placed but not yet scheduled.  Translator Ryan Wells present throughout appointment.  Denies chest pain or shortness of breath. No palpitations. No syncope. Fluid managed by dialysis. Has discussed what happens with transplant with [facility] team but isn't listed on the transplant list yet.  We discussed both the echo and the lexiscan nuclear stress test. Explained process and what the  tests show. All questions answered. Deferred questions re: renal transplant and dialysis to his nephrology team. He is asking about timeline, etc.  Past Medical History:  Diagnosis Date  . 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 AAA (abdominal aortic aneurysm) ([place])    Not noted on CT abd 2019  . Chest pain 11/2013   Normal Echo/ EKG/enzymes-hospitalized.  Did not get outpatient stress testing following hospitalization.  No chest pain since  . Chronic kidney disease   . Diabetes mellitus without complication ([place])   . Diabetes type 2, uncontrolled ([place]) 11/25/2011   no meds, diet controlled per patient  . Diabetic gastroparesis ([place]) 01/22/2017  . Hypertension    no meds  . Microalbuminuria 01/19/2017  . Myocardial infarction [facility]) 2015    Past Surgical History:  Procedure Laterality Date  . A/V FISTULAGRAM Left 06/04/2019   Procedure: A/V FISTULAGRAM;  Surgeon: Angelia Mould, MD;  Location: [place] [facility];  Service: Cardiovascular;  Laterality: Left;  . A/V FISTULAGRAM Left 10/15/2019   Procedure: A/V FISTULAGRAM;  Surgeon: Angelia Mould, MD;  Location: [place] [facility];  Service: Cardiovascular;  Laterality: Left;  . AV FISTULA PLACEMENT Left 07/02/2018   Procedure: BRACHIOCEPHALIC ARTERIOVENOUS (AV) FISTULA CREATION LEFT ARM;  Surgeon: Serafina Mitchell, MD;  Location: [place];  Service: Vascular;  Laterality: Left;  . BASCILIC VEIN TRANSPOSITION Left 07/26/2019   Procedure: Bascilic Vein Transposition;  Surgeon: Angelia Mould, MD;  Location: [facility] OR;  Service: Vascular;  Laterality: Left;  . COLONOSCOPY    .  EMBOLIZATION Left 06/04/2019   Procedure: EMBOLIZATION;  Surgeon: Angelia Mould, MD;  Location: [place] [facility];  Service: Cardiovascular;  Laterality: Left;  LT ARM FISTULA/COMPETING BRANCH  . EYE SURGERY Bilateral    cataracts x2  . EYE SURGERY    . INSERTION OF DIALYSIS CATHETER Right 06/08/2019   Procedure: INSERTION OF  PALINDROME DIALYSIS CATHETER IN RIGHT INTERNAL JUGULAR;  Surgeon: Angelia Mould, MD;  Location: [place];  Service: Vascular;  Laterality: Right;  . LIGATION OF ARTERIOVENOUS  FISTULA Left 07/26/2019   Procedure: Ligation Of BrachioCephalic  Fistula;  Surgeon: Angelia Mould, MD;  Location: [facility] OR;  Service: Vascular;  Laterality: Left;  . NECK SURGERY    . PERIPHERAL VASCULAR BALLOON ANGIOPLASTY Left 06/04/2019   Procedure: PERIPHERAL VASCULAR BALLOON ANGIOPLASTY;  Surgeon: Angelia Mould, MD;  Location: [place] [facility];  Service: Cardiovascular;  Laterality: Left;  ARM FISTULA  . PERIPHERAL VASCULAR BALLOON ANGIOPLASTY Left 10/15/2019   Procedure: PERIPHERAL VASCULAR BALLOON ANGIOPLASTY;  Surgeon: Angelia Mould, MD;  Location: [place] [facility];  Service: Cardiovascular;  Laterality: Left;  arm fistula    Current Medications: Current Outpatient Medications on File Prior to Visit  Medication Sig  . B Complex-C-Folic Acid (DIALYVITE 528) 0.8 MG TABS Take 1 tablet by mouth daily.  . cholecalciferol (VITAMIN D3) 25 MCG (1000 UT) tablet Take 2,000 Units by mouth daily.  Marland Kitchen gabapentin (NEURONTIN) 100 MG capsule Take 1 capsule (100 mg total) by mouth 3 (three) times daily. (Patient not taking: Reported on 12/29/2019)  . TUMS E-X 750 750 MG chewable tablet Chew 2 tablets by mouth at bedtime.  . VELPHORO 500 MG chewable tablet Chew 1,000 mg by mouth 3 (three) times daily.   No current facility-administered medications on file prior to visit.     Allergies:   Patient has no known allergies.   Social History   Tobacco Use  . Smoking status: Never Smoker  . Smokeless tobacco: Never Used  Substance Use Topics  . Alcohol use: Never  . Drug use: Never    Family History: family history includes Diabetes in his brother, father, and sister; Heart disease in his mother; Kidney disease in his father.  ROS:   Please see the history of present illness.  Additional  pertinent ROS otherwise unremarkable.     EKGs/Labs/Other Studies Reviewed:    The following studies were reviewed today:  Echo stress test 10/04/19 Patient Performance: The patient exercised for 4 minutes achieving 5 METS.  The maximum stage achieved was II of the Bruce protocol. The baseline  heart rate was 70 bpm. The heart rate at peak stress was 101 bpm. The  target heart rate was calculated to be  132 bpm. The maximum predicted heart rate was 65.2 %. The baseline blood  pressure was 155/91 mmHg. The blood pressure at peak stress was 134/84/84  mmHg. The blood pressure response was normal. The patient developed  fatigue, leg fatigue and Patient stated  his calves and toes felt cold and numb during the stress exam. The  patient's functional capacity was below average. Target heart rate note  achieved.    EKG: Resting EKG showed normal sinus rhythm. The patient developed no  abnormal EKG findings during exercise.    2D Echo Findings: The baseline ejection fraction was 60%. The peak  ejection fraction at stress was 60%. Baseline regional wall motion  abnormalities were not present. There were no regional wall motion  abnormalities identified at  peak stress. There were  no stress-induced wall motion abnormalities. This is a negative stress  echocardiogram for ischemia. This is a non-diagnostic study for ischemia  due to inadequate heart rate response.   1. This is a negative stress echocardiogram for ischemia. Non-diagnostic  study for ischemia due to inadequate heart rate response.  2. This is an indeterminate risk study.   Echo 11/28/2013 Left ventricle: The cavity size was normal. Wall thickness  was normal. Systolic function was normal. The estimated  ejection fraction was in the range of 60% to 65%. Wall  motion was normal; there were no regional wall motion  abnormalities. Left ventricular diastolic function  parameters were normal.   EKG:  EKG is personally  reviewed.  The ekg ordered 07/30/19 demonstrates sinus rhythm at 71 bpm, early R transition, nonspecific ST-T changes in I, aVL changed from 06/04/19.  Recent Labs: 06/04/2019: ALT 11 06/09/2019: Platelets 157 10/15/2019: BUN 38; Creatinine, Ser 6.50; Hemoglobin 11.2; Potassium 5.7; Sodium 133  Recent Lipid Panel    Component Value Date/Time   CHOL 248 (H) 07/12/2016 1101   TRIG 180 (H) 07/12/2016 1101   HDL 54 07/12/2016 1101   CHOLHDL 4.2 11/27/2013 1709   VLDL 25 11/27/2013 1709   LDLCALC 158 (H) 07/12/2016 1101    Physical Exam:    VS:  BP (!) 104/58   Pulse 76   Ht 5\' 2"  (1.575 m)   Wt 141 lb (64 kg)   SpO2 99%   BMI 25.79 kg/m     Wt Readings from Last 3 Encounters:  12/29/19 141 lb (64 kg)  10/15/19 143 lb 4.8 oz (65 kg)  10/13/19 139 lb (63 kg)    GEN: Well nourished, well developed in no acute distress HEENT: Normal, moist mucous membranes NECK: No JVD CARDIAC: regular rhythm, normal S1 and S2, no rubs or gallops. No murmur. VASCULAR: Radial and DP pulses 2+ bilaterally. No carotid bruits RESPIRATORY:  Clear to auscultation without rales, wheezing or rhonchi  ABDOMEN: Soft, non-tender, non-distended MUSCULOSKELETAL:  Ambulates independently SKIN: Warm and dry, no edema NEUROLOGIC:  Alert and oriented x 3. No focal neuro deficits noted. PSYCHIATRIC:  Normal affect   ASSESSMENT:    1. Encounter for pre-transplant evaluation for chronic kidney disease   2. ESRD on dialysis ([place])   3. Abnormal ECG   4. Cardiac risk counseling   5. Counseling on health promotion and disease prevention    PLAN:    Cardiovascular evaluation for renal transplant: ESRD 2/2 complications from type II diabetes -denies any active symptoms -does have abnormal ECG, changed from 05/2019 -renal transplant team requested stress echo, but he could not get to target heart rate -have ordered lexican stress and transthoracic echo per transplant team request -rest of workup as per  transplant team  Cardiac risk counseling and prevention recommendations: -recommend heart healthy/Mediterranean diet, with whole grains, fruits, vegetable, fish, lean meats, nuts, and olive oil. Limit salt. -recommend moderate walking, 3-5 times/week for 30-50 minutes each session. Aim for at least 150 minutes.week. Goal should be pace of 3 miles/hours, or walking 1.5 miles in 30 minutes -recommend avoidance of tobacco products. Avoid excess alcohol. -benefit of primary prevention statins unclear in end stage renal disease  Plan for follow up: 1 year or sooner as needed by transplant team  Medication Adjustments/Labs and Tests Ordered: Current medicines are reviewed at length with the patient today.  Concerns regarding medicines are outlined above.  No orders of the defined types were placed  in this encounter.  No orders of the defined types were placed in this encounter.   Patient Instructions  Medication Instructions:  Your Physician recommend you continue on your current medication as directed.    *If you need a refill on your cardiac medications before your next appointment, please call your pharmacy*   Lab Work: None   Testing/Procedures: Please schedule previous ordered ECHO and Lexiscan.    Follow-Up: At [facility], you and your health needs are our priority.  As part of our continuing mission to provide you with exceptional heart care, we have created designated Provider Care Teams.  These Care Teams include your primary Cardiologist (physician) and Advanced Practice Providers (APPs -  Physician Assistants and Nurse Practitioners) who all work together to provide you with the care you need, when you need it.  We recommend signing up for the patient portal called "MyChart".  Sign up information is provided on this After Visit Summary.  MyChart is used to connect with patients for Virtual Visits (Telemedicine).  Patients are able to view lab/test results, encounter notes,  upcoming appointments, etc.  Non-urgent messages can be sent to your provider as well.   To learn more about what you can do with MyChart, go to [email or web address].    Your next appointment:   1 year(s)  The format for your next appointment:   In Person  Provider:   Buford Dresser, MD       Signed, Buford Dresser, MD PhD 12/29/2019 1:36 PM    [place]

## 2019-12-31 ENCOUNTER — Ambulatory Visit (HOSPITAL_COMMUNITY)
Admission: RE | Admit: 2019-12-31 | Payer: Medicare Other | Source: Ambulatory Visit | Attending: Cardiology | Admitting: Cardiology

## 2019-12-31 ENCOUNTER — Other Ambulatory Visit: Payer: Self-pay

## 2019-12-31 ENCOUNTER — Encounter: Payer: Self-pay | Admitting: Cardiology

## 2020-01-05 ENCOUNTER — Encounter: Payer: Self-pay | Admitting: Cardiology

## 2020-01-05 ENCOUNTER — Telehealth (HOSPITAL_COMMUNITY): Payer: Self-pay

## 2020-01-05 NOTE — Telephone Encounter (Signed)
Encounter complete. 

## 2020-01-07 ENCOUNTER — Ambulatory Visit (HOSPITAL_COMMUNITY)
Admission: RE | Admit: 2020-01-07 | Discharge: 2020-01-07 | Disposition: A | Discharge status: Home / Self Care | Payer: Medicare Other | Source: Ambulatory Visit | Attending: Cardiovascular Disease | Admitting: Cardiovascular Disease

## 2020-01-07 ENCOUNTER — Other Ambulatory Visit: Payer: Self-pay

## 2020-01-07 DIAGNOSIS — I2510 Atherosclerotic heart disease of native coronary artery without angina pectoris: Secondary | ICD-10-CM | POA: Diagnosis not present

## 2020-01-07 DIAGNOSIS — E1122 Type 2 diabetes mellitus with diabetic chronic kidney disease: Secondary | ICD-10-CM | POA: Diagnosis not present

## 2020-01-07 DIAGNOSIS — E1140 Type 2 diabetes mellitus with diabetic neuropathy, unspecified: Secondary | ICD-10-CM | POA: Insufficient documentation

## 2020-01-07 DIAGNOSIS — Z992 Dependence on renal dialysis: Secondary | ICD-10-CM | POA: Diagnosis not present

## 2020-01-07 DIAGNOSIS — N186 End stage renal disease: Secondary | ICD-10-CM | POA: Diagnosis not present

## 2020-01-07 DIAGNOSIS — I120 Hypertensive chronic kidney disease with stage 5 chronic kidney disease or end stage renal disease: Secondary | ICD-10-CM | POA: Diagnosis not present

## 2020-01-07 DIAGNOSIS — I252 Old myocardial infarction: Secondary | ICD-10-CM | POA: Diagnosis not present

## 2020-01-07 DIAGNOSIS — Z8616 Personal history of COVID-19: Secondary | ICD-10-CM | POA: Insufficient documentation

## 2020-01-07 DIAGNOSIS — Z01818 Encounter for other preprocedural examination: Secondary | ICD-10-CM | POA: Diagnosis not present

## 2020-01-07 DIAGNOSIS — Z8249 Family history of ischemic heart disease and other diseases of the circulatory system: Secondary | ICD-10-CM | POA: Diagnosis not present

## 2020-01-07 DIAGNOSIS — Z01810 Encounter for preprocedural cardiovascular examination: Secondary | ICD-10-CM

## 2020-01-07 LAB — MYOCARDIAL PERFUSION IMAGING
LV dias vol: 104 mL (ref 62–150)
LV sys vol: 36 mL
Peak HR: 79 {beats}/min
Rest HR: 65 {beats}/min
SDS: 2
SRS: 1
SSS: 3
TID: 0.86

## 2020-01-07 MED ORDER — TECHNETIUM TC 99M TETROFOSMIN IV KIT
30.5000 | PACK | Freq: Once | INTRAVENOUS | Status: AC | PRN
  Administered 2020-01-07: 30.5 via INTRAVENOUS
  Filled 2020-01-07: qty 31

## 2020-01-07 MED ORDER — REGADENOSON 0.4 MG/5ML IV SOLN
0.4000 mg | Freq: Once | INTRAVENOUS | Status: AC
  Administered 2020-01-07: 0.4 mg via INTRAVENOUS

## 2020-01-07 MED ORDER — TECHNETIUM TC 99M TETROFOSMIN IV KIT
10.0000 | PACK | Freq: Once | INTRAVENOUS | Status: AC | PRN
  Administered 2020-01-07: 10 via INTRAVENOUS
  Filled 2020-01-07: qty 10

## 2020-01-21 ENCOUNTER — Other Ambulatory Visit: Payer: Self-pay

## 2020-01-21 ENCOUNTER — Ambulatory Visit (HOSPITAL_COMMUNITY): Payer: Medicare Other | Attending: Cardiology

## 2020-01-21 DIAGNOSIS — Z01818 Encounter for other preprocedural examination: Secondary | ICD-10-CM | POA: Insufficient documentation

## 2020-01-21 MED ORDER — PERFLUTREN LIPID MICROSPHERE
1.0000 mL | INTRAVENOUS | Status: AC | PRN
  Administered 2020-01-21: 2 mL via INTRAVENOUS

## 2020-03-21 ENCOUNTER — Ambulatory Visit: Payer: Medicare Other | Admitting: Internal Medicine

## 2020-03-21 ENCOUNTER — Encounter: Payer: Self-pay | Admitting: Internal Medicine

## 2020-03-21 VITALS — BP 168/60 | HR 68 | Ht 62.0 in | Wt 147.0 lb

## 2020-03-21 DIAGNOSIS — N186 End stage renal disease: Secondary | ICD-10-CM | POA: Diagnosis not present

## 2020-03-21 DIAGNOSIS — Z992 Dependence on renal dialysis: Secondary | ICD-10-CM | POA: Diagnosis not present

## 2020-03-21 DIAGNOSIS — Z1211 Encounter for screening for malignant neoplasm of colon: Secondary | ICD-10-CM | POA: Diagnosis not present

## 2020-03-21 NOTE — Progress Notes (Signed)
HISTORY OF PRESENT ILLNESS:  Ryan Wells is a 64 y.o. male, non-English-speaking native of [place] but long-term resident of the [place] now retired from medical distribution with [employer], who sent today by the dialysis center physicians regarding screening colonoscopy in anticipation of possible renal transplant.  Patient has a history of hypertension and diabetes mellitus.  He has been on hemodialysis since October 2020.  He dialyzes Tuesdays, Thursdays, Saturdays.  He has undergone cardiac evaluation Dec 29, 2019 with Dr. Harrell Gave.  Stress echo was negative for ischemia.  Ejection fraction between 60 and 65%.  He is no longer requiring medications for diabetes.  Hemoglobin A1c 3 years ago was 14.5.  9 months ago it was 5.5.  Transplant evaluation has been occurring to [facility].  He does tell me that he had colonoscopy in [place] about 10 or 15 years ago.  He was told that time "no problems".  Patient's GI review of systems negative.  Review of blood work from October 15, 2019 shows hemoglobin 11.2.  CT scan August 2019 to evaluate abdominal pain revealed no acute abnormalities.  He is accompanied today by a professional interpreter.  He has completed his Covid vaccination series  REVIEW OF SYSTEMS:  All non-GI ROS negative unless otherwise stated in the HPI.  Past Medical History:  Diagnosis Date  . 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 AAA (abdominal aortic aneurysm) ([place])    Not noted on CT abd 2019  . Chest pain 11/2013   Normal Echo/ EKG/enzymes-hospitalized.  Did not get outpatient stress testing following hospitalization.  No chest pain since  . Chronic kidney disease    on dialysis Tues, Thurs and Sat  . Diabetes mellitus without complication ([place])   . Diabetes type 2, uncontrolled ([place]) 11/25/2011   no meds, diet controlled per patient  . Diabetic gastroparesis ([place]) 01/22/2017  . Hypertension    no meds  . Microalbuminuria 01/19/2017  .  Myocardial infarction [facility]) 2015    Past Surgical History:  Procedure Laterality Date  . A/V FISTULAGRAM Left 06/04/2019   Procedure: A/V FISTULAGRAM;  Surgeon: Angelia Mould, MD;  Location: [place] [facility];  Service: Cardiovascular;  Laterality: Left;  . A/V FISTULAGRAM Left 10/15/2019   Procedure: A/V FISTULAGRAM;  Surgeon: Angelia Mould, MD;  Location: [place] [facility];  Service: Cardiovascular;  Laterality: Left;  . AV FISTULA PLACEMENT Left 07/02/2018   Procedure: BRACHIOCEPHALIC ARTERIOVENOUS (AV) FISTULA CREATION LEFT ARM;  Surgeon: Serafina Mitchell, MD;  Location: [place];  Service: Vascular;  Laterality: Left;  . BASCILIC VEIN TRANSPOSITION Left 07/26/2019   Procedure: Bascilic Vein Transposition;  Surgeon: Angelia Mould, MD;  Location: [facility] OR;  Service: Vascular;  Laterality: Left;  . COLONOSCOPY    . EMBOLIZATION Left 06/04/2019   Procedure: EMBOLIZATION;  Surgeon: Angelia Mould, MD;  Location: [place] [facility];  Service: Cardiovascular;  Laterality: Left;  LT ARM FISTULA/COMPETING BRANCH  . EYE SURGERY Bilateral    cataracts x2  . EYE SURGERY    . INSERTION OF DIALYSIS CATHETER Right 06/08/2019   Procedure: INSERTION OF PALINDROME DIALYSIS CATHETER IN RIGHT INTERNAL JUGULAR;  Surgeon: Angelia Mould, MD;  Location: [place];  Service: Vascular;  Laterality: Right;  . LIGATION OF ARTERIOVENOUS  FISTULA Left 07/26/2019   Procedure: Ligation Of BrachioCephalic  Fistula;  Surgeon: Angelia Mould, MD;  Location: [facility] OR;  Service: Vascular;  Laterality: Left;  . NECK SURGERY    .  PERIPHERAL VASCULAR BALLOON ANGIOPLASTY Left 06/04/2019   Procedure: PERIPHERAL VASCULAR BALLOON ANGIOPLASTY;  Surgeon: Angelia Mould, MD;  Location: [place] [facility];  Service: Cardiovascular;  Laterality: Left;  ARM FISTULA  . PERIPHERAL VASCULAR BALLOON ANGIOPLASTY Left 10/15/2019   Procedure: PERIPHERAL VASCULAR BALLOON ANGIOPLASTY;  Surgeon:  Angelia Mould, MD;  Location: [place] [facility];  Service: Cardiovascular;  Laterality: Left;  arm fistula    Social History Jusitn Salsgiver  reports that he has quit smoking. He has never used smokeless tobacco. He reports previous alcohol use. He reports that he does not use drugs.  family history includes Diabetes in his brother, father, and sister; Heart disease in his mother; Kidney disease in his father.  No Known Allergies     PHYSICAL EXAMINATION: Vital signs: BP (!) 168/60   Pulse 68   Ht 5\' 2"  (1.575 m)   Wt 147 lb (66.7 kg)   SpO2 98%   BMI 26.89 kg/m   Constitutional: generally well-appearing, no acute distress Psychiatric: alert and oriented x3, cooperative Eyes: extraocular movements intact, anicteric, conjunctiva pink Mouth: oral pharynx moist, no lesions Neck: supple no lymphadenopathy Cardiovascular: heart regular rate and rhythm, no murmur Lungs: clear to auscultation bilaterally Abdomen: soft, nontender, nondistended, no obvious ascites, no peritoneal signs, normal bowel sounds, no organomegaly Rectal: Deferred till: Extremities: no clubbing, cyanosis, or lower extremity edema bilaterally.  Well-functioning AV fistula left upper extremity with good thrill Skin: no lesions on visible extremities Neuro: No focal deficits.  Cranial nerves intact  ASSESSMENT:  1.  Screening colonoscopy in anticipation of kidney transplantation.  Appropriate candidate without contraindication.  Reportedly had unremarkable colonoscopy 10 or 15 years ago 2.  End-stage renal disease on dialysis.  Under active evaluation at [facility] for possible kidney transplantation 3.  History of hypertension diabetes.  Currently on no medical therapies   PLAN:  #1.  Schedule colonoscopy on nondialysis day.  The patient is high risk given his principal comorbidity of end-stage renal disease on dialysis.The nature of the procedure, as well as the risks, benefits, and alternatives were  carefully and thoroughly reviewed with the patient. Ample time for discussion and questions allowed. The patient understood, was satisfied, and agreed to proceed. A total time of 45 minutes was spent preparing to see the patient, reviewing outside tests and records, obtaining comprehensive history, performing comprehensive physical examination, counseling the patient (with the assistance of the interpreter) regarding his above listed issues and the plan, ordering and scheduling advanced endoscopic procedure, and documenting clinical information in the health record.

## 2020-03-21 NOTE — Patient Instructions (Signed)
You have been scheduled for a colonoscopy. Please follow written instructions given to you at your visit today.  Please pick up your prep supplies at the pharmacy within the next 1-3 days. If you use inhalers (even only as needed), please bring them with you on the day of your procedure.   

## 2020-05-22 ENCOUNTER — Ambulatory Visit (AMBULATORY_SURGERY_CENTER): Payer: Medicare Other | Admitting: Internal Medicine

## 2020-05-22 ENCOUNTER — Other Ambulatory Visit: Payer: Self-pay

## 2020-05-22 ENCOUNTER — Encounter: Payer: Self-pay | Admitting: Internal Medicine

## 2020-05-22 VITALS — BP 122/92 | HR 65 | Temp 98.6°F | Resp 11 | Ht 62.0 in | Wt 147.0 lb

## 2020-05-22 DIAGNOSIS — Z1211 Encounter for screening for malignant neoplasm of colon: Secondary | ICD-10-CM

## 2020-05-22 MED ORDER — SODIUM CHLORIDE 0.9 % IV SOLN: 500.0000 mL | Freq: Once | INTRAVENOUS | Status: DC

## 2020-05-22 NOTE — Progress Notes (Signed)
Report to PACU, RN, vss, BBS= Clear.  

## 2020-05-22 NOTE — Op Note (Signed)
[place] Patient Name: Jimmylee Ratterree Procedure Date: 05/22/2020 11:17 AM MRN: [id number] Endoscopist: Docia Chuck. Henrene Pastor , MD Age: 64 Referring MD:  Date of Birth: June 08, 1956 Gender: Male Account #: [id number] Procedure:                Colonoscopy Indications:              Screening for colorectal malignant neoplasm.                            Reports prior examination elsewhere 10 to 15 years                            ago. Medicines:                Monitored Anesthesia Care Procedure:                Pre-Anesthesia Assessment:                           - Prior to the procedure, a History and Physical                            was performed, and patient medications and                            allergies were reviewed. The patient's tolerance of                            previous anesthesia was also reviewed. The risks                            and benefits of the procedure and the sedation                            options and risks were discussed with the patient.                            All questions were answered, and informed consent                            was obtained. Prior Anticoagulants: The patient has                            taken no previous anticoagulant or antiplatelet                            agents. ASA Grade Assessment: III - A patient with                            severe systemic disease. After reviewing the risks                            and benefits, the patient was deemed in  satisfactory condition to undergo the procedure.                           After obtaining informed consent, the colonoscope                            was passed under direct vision. Throughout the                            procedure, the patient's blood pressure, pulse, and                            oxygen saturations were monitored continuously. The                            Colonoscope was introduced through the anus and                             advanced to the the cecum, identified by                            appendiceal orifice and ileocecal valve. The                            ileocecal valve, appendiceal orifice, and rectum                            were photographed. The quality of the bowel                            preparation was excellent. The colonoscopy was                            performed without difficulty. The patient tolerated                            the procedure well. The bowel preparation used was                            SUPREP via split dose instruction. Scope In: 11:28:38 AM Scope Out: 11:38:34 AM Scope Withdrawal Time: 0 hours 8 minutes 36 seconds  Total Procedure Duration: 0 hours 9 minutes 56 seconds  Findings:                 A diffuse area of mild melanosis was found in the                            entire colon.                           Internal hemorrhoids were found during                            retroflexion. The hemorrhoids were small.  The exam was otherwise without abnormality on                            direct and retroflexion views. Complications:            No immediate complications. Estimated blood loss:                            None. Estimated Blood Loss:     Estimated blood loss: none. Impression:               - Melanosis in the colon.                           - Internal hemorrhoids.                           - The examination was otherwise normal on direct                            and retroflexion views.                           - No specimens collected. Recommendation:           - Repeat colonoscopy in 10 years for screening                            purposes.                           - Patient has a contact number available for                            emergencies. The signs and symptoms of potential                            delayed complications were discussed with the                            patient.  Return to normal activities tomorrow.                            Written discharge instructions were provided to the                            patient.                           - Resume previous diet.                           - Continue present medications. Docia Chuck. Henrene Pastor, MD 05/22/2020 11:43:52 AM This report has been signed electronically.

## 2020-05-22 NOTE — Patient Instructions (Signed)
Handout provided on hemorrhoids.   USTED TUVO UN PROCEDIMIENTO ENDOSCPICO HOY EN EL [facility]:   Lea el informe del procedimiento que se le entreg para cualquier pregunta especfica sobre lo que se Primary school teacher.  Si el informe del examen no responde a sus preguntas, por favor llame a su gastroenterlogo para aclararlo.  Si usted solicit que no se le den [employer] de lo que se Estate manager/land agent en su procedimiento al [employer] va a cuidar, entonces el informe del procedimiento se ha incluido en un sobre sellado para que usted lo revise despus cuando le sea ms conveniente.   LO QUE PUEDE ESPERAR: Algunas sensaciones de hinchazn en el abdomen.  Puede tener ms gases de lo normal.  El caminar puede ayudarle a eliminar el aire que se le puso en el tracto gastrointestinal durante el procedimiento y reducir la hinchazn.  Si le hicieron una endoscopia inferior (como una colonoscopia o una sigmoidoscopia flexible), podra notar manchas de sangre en las heces fecales o en el papel higinico.  Si se someti a una preparacin intestinal para su procedimiento, es posible que no tenga una evacuacin intestinal normal durante [employer].   Tenga en cuenta:  Es posible que note un poco de irritacin y congestin en la nariz o algn drenaje.  Esto es debido al oxgeno [employer] durante su procedimiento.  No hay que preocuparse y esto debe desaparecer ms o Scientist, research (medical).   SNTOMAS PARA REPORTAR INMEDIATAMENTE:  Despus de una endoscopia inferior (colonoscopia o sigmoidoscopia flexible):  Cantidades excesivas de sangre en las heces fecales  Sensibilidad significativa o empeoramiento de los dolores abdominales   Hinchazn aguda del abdomen que antes no tena   Fiebre de 100F o ms    Para asuntos urgentes o de Freight forwarder, puede comunicarse con un gastroenterlogo a cualquier hora llamando al [phone].  DIETA:  Recomendamos una comida pequea al principio, pero luego puede  continuar con su dieta normal.  Tome muchos lquidos, Teacher, adult education las bebidas alcohlicas durante 24 horas.    ACTIVIDAD:  Debe planear tomarse las cosas con calma por el resto del da y no debe CONDUCIR ni usar maquinaria pesada Programmer, applications (debido a los medicamentos de sedacin utilizados durante el examen).     SEGUIMIENTO: Nuestro personal llamar al nmero que aparece en su historial al siguiente da hbil de su procedimiento para ver cmo se siente y para responder cualquier pregunta o inquietud que pueda tener con respecto a la informacin que se le dio despus del procedimiento. Si no podemos contactarle, le dejaremos un mensaje.  Sin embargo, si se siente bien y no tiene Paediatric nurse, no es necesario que nos devuelva la llamada.  Asumiremos que ha regresado a sus actividades diarias normales sin incidentes. Si se le tomaron algunas biopsias, le contactaremos por telfono o por carta en las prximas 3 semanas.  Si no ha sabido [employer] biopsias en el transcurso de 3 semanas, por favor llmenos al [phone].   FIRMAS/CONFIDENCIALIDAD: Usted y/o el acompaante que le cuide han firmado documentos que se ingresarn en su historial mdico electrnico.  Estas firmas atestiguan el hecho de que la informacin anterior

## 2020-05-24 ENCOUNTER — Telehealth: Payer: Self-pay | Admitting: *Deleted

## 2020-05-24 ENCOUNTER — Telehealth: Payer: Self-pay

## 2020-05-24 NOTE — Telephone Encounter (Signed)
°  Follow up Call-  Call back number 05/22/2020  Post procedure Call Back phone  # Jori Moll (son) [phone]  Permission to leave phone message Yes  Some recent data might be hidden     Patient questions:  Do you have a fever, pain , or abdominal swelling? No. Pain Score  0 *  Have you tolerated food without any problems? Yes.    Have you been able to return to your normal activities? Yes.    Do you have any questions about your discharge instructions: Diet   No. Medications  No. Follow up visit  No.  Do you have questions or concerns about your Care? No.  Actions: * If pain score is 4 or above: No action needed, pain <4.  1. Have you developed a fever since your procedure? no  2.   Have you had an respiratory symptoms (SOB or cough) since your procedure? no  3.   Have you tested positive for COVID 19 since your procedure no  4.   Have you had any family members/close contacts diagnosed with the COVID 19 since your procedure?  no   If yes to any of these questions please route to Joylene John, RN and Joella Prince, RN

## 2020-05-24 NOTE — Telephone Encounter (Signed)
No answer or voicemail on follow up call.

## 2020-10-11 DIAGNOSIS — N2581 Secondary hyperparathyroidism of renal origin: Secondary | ICD-10-CM | POA: Diagnosis not present

## 2020-10-11 DIAGNOSIS — Z992 Dependence on renal dialysis: Secondary | ICD-10-CM | POA: Diagnosis not present

## 2020-10-11 DIAGNOSIS — N186 End stage renal disease: Secondary | ICD-10-CM | POA: Diagnosis not present

## 2020-10-13 DIAGNOSIS — N186 End stage renal disease: Secondary | ICD-10-CM | POA: Diagnosis not present

## 2020-10-13 DIAGNOSIS — N2581 Secondary hyperparathyroidism of renal origin: Secondary | ICD-10-CM | POA: Diagnosis not present

## 2020-10-13 DIAGNOSIS — Z992 Dependence on renal dialysis: Secondary | ICD-10-CM | POA: Diagnosis not present

## 2020-10-16 DIAGNOSIS — N2581 Secondary hyperparathyroidism of renal origin: Secondary | ICD-10-CM | POA: Diagnosis not present

## 2020-10-16 DIAGNOSIS — Z992 Dependence on renal dialysis: Secondary | ICD-10-CM | POA: Diagnosis not present

## 2020-10-16 DIAGNOSIS — N186 End stage renal disease: Secondary | ICD-10-CM | POA: Diagnosis not present

## 2020-10-18 DIAGNOSIS — Z992 Dependence on renal dialysis: Secondary | ICD-10-CM | POA: Diagnosis not present

## 2020-10-18 DIAGNOSIS — N186 End stage renal disease: Secondary | ICD-10-CM | POA: Diagnosis not present

## 2020-10-18 DIAGNOSIS — N2581 Secondary hyperparathyroidism of renal origin: Secondary | ICD-10-CM | POA: Diagnosis not present

## 2020-10-20 DIAGNOSIS — Z992 Dependence on renal dialysis: Secondary | ICD-10-CM | POA: Diagnosis not present

## 2020-10-20 DIAGNOSIS — N186 End stage renal disease: Secondary | ICD-10-CM | POA: Diagnosis not present

## 2020-10-20 DIAGNOSIS — N2581 Secondary hyperparathyroidism of renal origin: Secondary | ICD-10-CM | POA: Diagnosis not present

## 2020-10-23 DIAGNOSIS — Z992 Dependence on renal dialysis: Secondary | ICD-10-CM | POA: Diagnosis not present

## 2020-10-23 DIAGNOSIS — N186 End stage renal disease: Secondary | ICD-10-CM | POA: Diagnosis not present

## 2020-10-23 DIAGNOSIS — N2581 Secondary hyperparathyroidism of renal origin: Secondary | ICD-10-CM | POA: Diagnosis not present

## 2020-10-25 DIAGNOSIS — Z992 Dependence on renal dialysis: Secondary | ICD-10-CM | POA: Diagnosis not present

## 2020-10-25 DIAGNOSIS — N186 End stage renal disease: Secondary | ICD-10-CM | POA: Diagnosis not present

## 2020-10-25 DIAGNOSIS — N2581 Secondary hyperparathyroidism of renal origin: Secondary | ICD-10-CM | POA: Diagnosis not present

## 2020-10-27 DIAGNOSIS — N186 End stage renal disease: Secondary | ICD-10-CM | POA: Diagnosis not present

## 2020-10-27 DIAGNOSIS — N2581 Secondary hyperparathyroidism of renal origin: Secondary | ICD-10-CM | POA: Diagnosis not present

## 2020-10-27 DIAGNOSIS — Z992 Dependence on renal dialysis: Secondary | ICD-10-CM | POA: Diagnosis not present

## 2020-10-30 DIAGNOSIS — N2581 Secondary hyperparathyroidism of renal origin: Secondary | ICD-10-CM | POA: Diagnosis not present

## 2020-10-30 DIAGNOSIS — Z992 Dependence on renal dialysis: Secondary | ICD-10-CM | POA: Diagnosis not present

## 2020-10-30 DIAGNOSIS — N186 End stage renal disease: Secondary | ICD-10-CM | POA: Diagnosis not present

## 2020-11-01 DIAGNOSIS — Z992 Dependence on renal dialysis: Secondary | ICD-10-CM | POA: Diagnosis not present

## 2020-11-01 DIAGNOSIS — N186 End stage renal disease: Secondary | ICD-10-CM | POA: Diagnosis not present

## 2020-11-01 DIAGNOSIS — N2581 Secondary hyperparathyroidism of renal origin: Secondary | ICD-10-CM | POA: Diagnosis not present

## 2020-11-02 DIAGNOSIS — Z125 Encounter for screening for malignant neoplasm of prostate: Secondary | ICD-10-CM | POA: Diagnosis not present

## 2020-11-02 DIAGNOSIS — Z114 Encounter for screening for human immunodeficiency virus [HIV]: Secondary | ICD-10-CM | POA: Diagnosis not present

## 2020-11-02 DIAGNOSIS — Z992 Dependence on renal dialysis: Secondary | ICD-10-CM | POA: Diagnosis not present

## 2020-11-02 DIAGNOSIS — Z01818 Encounter for other preprocedural examination: Secondary | ICD-10-CM | POA: Diagnosis not present

## 2020-11-02 DIAGNOSIS — I120 Hypertensive chronic kidney disease with stage 5 chronic kidney disease or end stage renal disease: Secondary | ICD-10-CM | POA: Diagnosis not present

## 2020-11-02 DIAGNOSIS — E1121 Type 2 diabetes mellitus with diabetic nephropathy: Secondary | ICD-10-CM | POA: Diagnosis not present

## 2020-11-02 DIAGNOSIS — E1122 Type 2 diabetes mellitus with diabetic chronic kidney disease: Secondary | ICD-10-CM | POA: Diagnosis not present

## 2020-11-02 DIAGNOSIS — E1151 Type 2 diabetes mellitus with diabetic peripheral angiopathy without gangrene: Secondary | ICD-10-CM | POA: Diagnosis not present

## 2020-11-02 DIAGNOSIS — I10 Essential (primary) hypertension: Secondary | ICD-10-CM | POA: Diagnosis not present

## 2020-11-02 DIAGNOSIS — Z7682 Awaiting organ transplant status: Secondary | ICD-10-CM | POA: Diagnosis not present

## 2020-11-02 DIAGNOSIS — E1142 Type 2 diabetes mellitus with diabetic polyneuropathy: Secondary | ICD-10-CM | POA: Diagnosis not present

## 2020-11-02 DIAGNOSIS — N186 End stage renal disease: Secondary | ICD-10-CM | POA: Diagnosis not present

## 2020-11-03 DIAGNOSIS — N186 End stage renal disease: Secondary | ICD-10-CM | POA: Diagnosis not present

## 2020-11-03 DIAGNOSIS — N2581 Secondary hyperparathyroidism of renal origin: Secondary | ICD-10-CM | POA: Diagnosis not present

## 2020-11-03 DIAGNOSIS — Z992 Dependence on renal dialysis: Secondary | ICD-10-CM | POA: Diagnosis not present

## 2020-11-06 DIAGNOSIS — Z992 Dependence on renal dialysis: Secondary | ICD-10-CM | POA: Diagnosis not present

## 2020-11-06 DIAGNOSIS — N2581 Secondary hyperparathyroidism of renal origin: Secondary | ICD-10-CM | POA: Diagnosis not present

## 2020-11-06 DIAGNOSIS — N186 End stage renal disease: Secondary | ICD-10-CM | POA: Diagnosis not present

## 2020-11-08 DIAGNOSIS — Z992 Dependence on renal dialysis: Secondary | ICD-10-CM | POA: Diagnosis not present

## 2020-11-08 DIAGNOSIS — N186 End stage renal disease: Secondary | ICD-10-CM | POA: Diagnosis not present

## 2020-11-08 DIAGNOSIS — N2581 Secondary hyperparathyroidism of renal origin: Secondary | ICD-10-CM | POA: Diagnosis not present

## 2020-11-09 DIAGNOSIS — N186 End stage renal disease: Secondary | ICD-10-CM | POA: Diagnosis not present

## 2020-11-09 DIAGNOSIS — Z992 Dependence on renal dialysis: Secondary | ICD-10-CM | POA: Diagnosis not present

## 2020-11-09 DIAGNOSIS — E1122 Type 2 diabetes mellitus with diabetic chronic kidney disease: Secondary | ICD-10-CM | POA: Diagnosis not present

## 2020-11-10 DIAGNOSIS — N186 End stage renal disease: Secondary | ICD-10-CM | POA: Diagnosis not present

## 2020-11-10 DIAGNOSIS — Z992 Dependence on renal dialysis: Secondary | ICD-10-CM | POA: Diagnosis not present

## 2020-11-10 DIAGNOSIS — N2581 Secondary hyperparathyroidism of renal origin: Secondary | ICD-10-CM | POA: Diagnosis not present

## 2020-11-13 DIAGNOSIS — N186 End stage renal disease: Secondary | ICD-10-CM | POA: Diagnosis not present

## 2020-11-13 DIAGNOSIS — N2581 Secondary hyperparathyroidism of renal origin: Secondary | ICD-10-CM | POA: Diagnosis not present

## 2020-11-13 DIAGNOSIS — Z992 Dependence on renal dialysis: Secondary | ICD-10-CM | POA: Diagnosis not present

## 2020-11-15 DIAGNOSIS — N2581 Secondary hyperparathyroidism of renal origin: Secondary | ICD-10-CM | POA: Diagnosis not present

## 2020-11-15 DIAGNOSIS — N186 End stage renal disease: Secondary | ICD-10-CM | POA: Diagnosis not present

## 2020-11-15 DIAGNOSIS — Z992 Dependence on renal dialysis: Secondary | ICD-10-CM | POA: Diagnosis not present

## 2020-11-17 DIAGNOSIS — N186 End stage renal disease: Secondary | ICD-10-CM | POA: Diagnosis not present

## 2020-11-17 DIAGNOSIS — N2581 Secondary hyperparathyroidism of renal origin: Secondary | ICD-10-CM | POA: Diagnosis not present

## 2020-11-17 DIAGNOSIS — Z992 Dependence on renal dialysis: Secondary | ICD-10-CM | POA: Diagnosis not present

## 2020-11-20 DIAGNOSIS — N186 End stage renal disease: Secondary | ICD-10-CM | POA: Diagnosis not present

## 2020-11-20 DIAGNOSIS — N2581 Secondary hyperparathyroidism of renal origin: Secondary | ICD-10-CM | POA: Diagnosis not present

## 2020-11-20 DIAGNOSIS — Z992 Dependence on renal dialysis: Secondary | ICD-10-CM | POA: Diagnosis not present

## 2020-11-22 DIAGNOSIS — N2581 Secondary hyperparathyroidism of renal origin: Secondary | ICD-10-CM | POA: Diagnosis not present

## 2020-11-22 DIAGNOSIS — Z992 Dependence on renal dialysis: Secondary | ICD-10-CM | POA: Diagnosis not present

## 2020-11-22 DIAGNOSIS — N186 End stage renal disease: Secondary | ICD-10-CM | POA: Diagnosis not present

## 2020-11-24 DIAGNOSIS — N2581 Secondary hyperparathyroidism of renal origin: Secondary | ICD-10-CM | POA: Diagnosis not present

## 2020-11-24 DIAGNOSIS — Z992 Dependence on renal dialysis: Secondary | ICD-10-CM | POA: Diagnosis not present

## 2020-11-24 DIAGNOSIS — N186 End stage renal disease: Secondary | ICD-10-CM | POA: Diagnosis not present

## 2020-11-27 DIAGNOSIS — Z992 Dependence on renal dialysis: Secondary | ICD-10-CM | POA: Diagnosis not present

## 2020-11-27 DIAGNOSIS — N186 End stage renal disease: Secondary | ICD-10-CM | POA: Diagnosis not present

## 2020-11-27 DIAGNOSIS — N2581 Secondary hyperparathyroidism of renal origin: Secondary | ICD-10-CM | POA: Diagnosis not present

## 2020-11-29 DIAGNOSIS — N186 End stage renal disease: Secondary | ICD-10-CM | POA: Diagnosis not present

## 2020-11-29 DIAGNOSIS — Z992 Dependence on renal dialysis: Secondary | ICD-10-CM | POA: Diagnosis not present

## 2020-11-29 DIAGNOSIS — N2581 Secondary hyperparathyroidism of renal origin: Secondary | ICD-10-CM | POA: Diagnosis not present

## 2020-12-01 DIAGNOSIS — N186 End stage renal disease: Secondary | ICD-10-CM | POA: Diagnosis not present

## 2020-12-01 DIAGNOSIS — N2581 Secondary hyperparathyroidism of renal origin: Secondary | ICD-10-CM | POA: Diagnosis not present

## 2020-12-01 DIAGNOSIS — Z992 Dependence on renal dialysis: Secondary | ICD-10-CM | POA: Diagnosis not present

## 2020-12-04 DIAGNOSIS — N186 End stage renal disease: Secondary | ICD-10-CM | POA: Diagnosis not present

## 2020-12-04 DIAGNOSIS — N2581 Secondary hyperparathyroidism of renal origin: Secondary | ICD-10-CM | POA: Diagnosis not present

## 2020-12-04 DIAGNOSIS — Z992 Dependence on renal dialysis: Secondary | ICD-10-CM | POA: Diagnosis not present

## 2020-12-06 DIAGNOSIS — Z992 Dependence on renal dialysis: Secondary | ICD-10-CM | POA: Diagnosis not present

## 2020-12-06 DIAGNOSIS — N2581 Secondary hyperparathyroidism of renal origin: Secondary | ICD-10-CM | POA: Diagnosis not present

## 2020-12-06 DIAGNOSIS — N186 End stage renal disease: Secondary | ICD-10-CM | POA: Diagnosis not present

## 2020-12-08 DIAGNOSIS — N2581 Secondary hyperparathyroidism of renal origin: Secondary | ICD-10-CM | POA: Diagnosis not present

## 2020-12-08 DIAGNOSIS — N186 End stage renal disease: Secondary | ICD-10-CM | POA: Diagnosis not present

## 2020-12-08 DIAGNOSIS — Z992 Dependence on renal dialysis: Secondary | ICD-10-CM | POA: Diagnosis not present

## 2020-12-09 DIAGNOSIS — N186 End stage renal disease: Secondary | ICD-10-CM | POA: Diagnosis not present

## 2020-12-09 DIAGNOSIS — E1122 Type 2 diabetes mellitus with diabetic chronic kidney disease: Secondary | ICD-10-CM | POA: Diagnosis not present

## 2020-12-09 DIAGNOSIS — Z992 Dependence on renal dialysis: Secondary | ICD-10-CM | POA: Diagnosis not present

## 2020-12-11 DIAGNOSIS — N186 End stage renal disease: Secondary | ICD-10-CM | POA: Diagnosis not present

## 2020-12-11 DIAGNOSIS — Z992 Dependence on renal dialysis: Secondary | ICD-10-CM | POA: Diagnosis not present

## 2020-12-11 DIAGNOSIS — N2581 Secondary hyperparathyroidism of renal origin: Secondary | ICD-10-CM | POA: Diagnosis not present

## 2020-12-13 DIAGNOSIS — N186 End stage renal disease: Secondary | ICD-10-CM | POA: Diagnosis not present

## 2020-12-13 DIAGNOSIS — Z992 Dependence on renal dialysis: Secondary | ICD-10-CM | POA: Diagnosis not present

## 2020-12-13 DIAGNOSIS — N2581 Secondary hyperparathyroidism of renal origin: Secondary | ICD-10-CM | POA: Diagnosis not present

## 2020-12-15 DIAGNOSIS — N186 End stage renal disease: Secondary | ICD-10-CM | POA: Diagnosis not present

## 2020-12-15 DIAGNOSIS — N2581 Secondary hyperparathyroidism of renal origin: Secondary | ICD-10-CM | POA: Diagnosis not present

## 2020-12-15 DIAGNOSIS — Z992 Dependence on renal dialysis: Secondary | ICD-10-CM | POA: Diagnosis not present

## 2020-12-18 DIAGNOSIS — N2581 Secondary hyperparathyroidism of renal origin: Secondary | ICD-10-CM | POA: Diagnosis not present

## 2020-12-18 DIAGNOSIS — Z992 Dependence on renal dialysis: Secondary | ICD-10-CM | POA: Diagnosis not present

## 2020-12-18 DIAGNOSIS — N186 End stage renal disease: Secondary | ICD-10-CM | POA: Diagnosis not present

## 2020-12-20 DIAGNOSIS — Z992 Dependence on renal dialysis: Secondary | ICD-10-CM | POA: Diagnosis not present

## 2020-12-20 DIAGNOSIS — N186 End stage renal disease: Secondary | ICD-10-CM | POA: Diagnosis not present

## 2020-12-20 DIAGNOSIS — N2581 Secondary hyperparathyroidism of renal origin: Secondary | ICD-10-CM | POA: Diagnosis not present

## 2020-12-22 DIAGNOSIS — N2581 Secondary hyperparathyroidism of renal origin: Secondary | ICD-10-CM | POA: Diagnosis not present

## 2020-12-22 DIAGNOSIS — N186 End stage renal disease: Secondary | ICD-10-CM | POA: Diagnosis not present

## 2020-12-22 DIAGNOSIS — Z992 Dependence on renal dialysis: Secondary | ICD-10-CM | POA: Diagnosis not present

## 2020-12-25 DIAGNOSIS — Z992 Dependence on renal dialysis: Secondary | ICD-10-CM | POA: Diagnosis not present

## 2020-12-25 DIAGNOSIS — N2581 Secondary hyperparathyroidism of renal origin: Secondary | ICD-10-CM | POA: Diagnosis not present

## 2020-12-25 DIAGNOSIS — N186 End stage renal disease: Secondary | ICD-10-CM | POA: Diagnosis not present

## 2020-12-27 DIAGNOSIS — Z992 Dependence on renal dialysis: Secondary | ICD-10-CM | POA: Diagnosis not present

## 2020-12-27 DIAGNOSIS — N186 End stage renal disease: Secondary | ICD-10-CM | POA: Diagnosis not present

## 2020-12-27 DIAGNOSIS — N2581 Secondary hyperparathyroidism of renal origin: Secondary | ICD-10-CM | POA: Diagnosis not present

## 2020-12-29 DIAGNOSIS — N2581 Secondary hyperparathyroidism of renal origin: Secondary | ICD-10-CM | POA: Diagnosis not present

## 2020-12-29 DIAGNOSIS — N186 End stage renal disease: Secondary | ICD-10-CM | POA: Diagnosis not present

## 2020-12-29 DIAGNOSIS — Z992 Dependence on renal dialysis: Secondary | ICD-10-CM | POA: Diagnosis not present

## 2021-01-01 DIAGNOSIS — N2581 Secondary hyperparathyroidism of renal origin: Secondary | ICD-10-CM | POA: Diagnosis not present

## 2021-01-01 DIAGNOSIS — N186 End stage renal disease: Secondary | ICD-10-CM | POA: Diagnosis not present

## 2021-01-01 DIAGNOSIS — Z992 Dependence on renal dialysis: Secondary | ICD-10-CM | POA: Diagnosis not present

## 2021-01-03 DIAGNOSIS — N186 End stage renal disease: Secondary | ICD-10-CM | POA: Diagnosis not present

## 2021-01-03 DIAGNOSIS — N2581 Secondary hyperparathyroidism of renal origin: Secondary | ICD-10-CM | POA: Diagnosis not present

## 2021-01-03 DIAGNOSIS — Z992 Dependence on renal dialysis: Secondary | ICD-10-CM | POA: Diagnosis not present

## 2021-01-05 DIAGNOSIS — Z992 Dependence on renal dialysis: Secondary | ICD-10-CM | POA: Diagnosis not present

## 2021-01-05 DIAGNOSIS — N2581 Secondary hyperparathyroidism of renal origin: Secondary | ICD-10-CM | POA: Diagnosis not present

## 2021-01-05 DIAGNOSIS — N186 End stage renal disease: Secondary | ICD-10-CM | POA: Diagnosis not present

## 2021-01-08 DIAGNOSIS — N186 End stage renal disease: Secondary | ICD-10-CM | POA: Diagnosis not present

## 2021-01-08 DIAGNOSIS — N2581 Secondary hyperparathyroidism of renal origin: Secondary | ICD-10-CM | POA: Diagnosis not present

## 2021-01-08 DIAGNOSIS — Z992 Dependence on renal dialysis: Secondary | ICD-10-CM | POA: Diagnosis not present

## 2021-01-09 DIAGNOSIS — N186 End stage renal disease: Secondary | ICD-10-CM | POA: Diagnosis not present

## 2021-01-09 DIAGNOSIS — E1122 Type 2 diabetes mellitus with diabetic chronic kidney disease: Secondary | ICD-10-CM | POA: Diagnosis not present

## 2021-01-09 DIAGNOSIS — Z992 Dependence on renal dialysis: Secondary | ICD-10-CM | POA: Diagnosis not present

## 2021-01-10 DIAGNOSIS — Z992 Dependence on renal dialysis: Secondary | ICD-10-CM | POA: Diagnosis not present

## 2021-01-10 DIAGNOSIS — N2581 Secondary hyperparathyroidism of renal origin: Secondary | ICD-10-CM | POA: Diagnosis not present

## 2021-01-10 DIAGNOSIS — N186 End stage renal disease: Secondary | ICD-10-CM | POA: Diagnosis not present

## 2021-01-12 DIAGNOSIS — N186 End stage renal disease: Secondary | ICD-10-CM | POA: Diagnosis not present

## 2021-01-12 DIAGNOSIS — Z992 Dependence on renal dialysis: Secondary | ICD-10-CM | POA: Diagnosis not present

## 2021-01-12 DIAGNOSIS — N2581 Secondary hyperparathyroidism of renal origin: Secondary | ICD-10-CM | POA: Diagnosis not present

## 2021-01-15 ENCOUNTER — Emergency Department (HOSPITAL_COMMUNITY): Payer: Medicare HMO

## 2021-01-15 ENCOUNTER — Other Ambulatory Visit: Payer: Self-pay

## 2021-01-15 ENCOUNTER — Inpatient Hospital Stay (HOSPITAL_COMMUNITY): Payer: Medicare HMO

## 2021-01-15 ENCOUNTER — Encounter (HOSPITAL_COMMUNITY): Payer: Self-pay | Admitting: Emergency Medicine

## 2021-01-15 ENCOUNTER — Inpatient Hospital Stay (HOSPITAL_COMMUNITY)
Admission: EM | Admit: 2021-01-15 | Discharge: 2021-01-30 | DRG: 871 | Disposition: A | Discharge status: Home Health | Payer: Medicare HMO | Attending: Student in an Organized Health Care Education/Training Program | Admitting: Student in an Organized Health Care Education/Training Program

## 2021-01-15 DIAGNOSIS — Z20822 Contact with and (suspected) exposure to covid-19: Secondary | ICD-10-CM | POA: Diagnosis not present

## 2021-01-15 DIAGNOSIS — R7881 Bacteremia: Secondary | ICD-10-CM | POA: Diagnosis present

## 2021-01-15 DIAGNOSIS — R112 Nausea with vomiting, unspecified: Secondary | ICD-10-CM | POA: Diagnosis present

## 2021-01-15 DIAGNOSIS — J189 Pneumonia, unspecified organism: Secondary | ICD-10-CM

## 2021-01-15 DIAGNOSIS — M79602 Pain in left arm: Secondary | ICD-10-CM | POA: Diagnosis present

## 2021-01-15 DIAGNOSIS — Z992 Dependence on renal dialysis: Secondary | ICD-10-CM

## 2021-01-15 DIAGNOSIS — E1143 Type 2 diabetes mellitus with diabetic autonomic (poly)neuropathy: Secondary | ICD-10-CM | POA: Diagnosis present

## 2021-01-15 DIAGNOSIS — R1110 Vomiting, unspecified: Secondary | ICD-10-CM | POA: Diagnosis not present

## 2021-01-15 DIAGNOSIS — J90 Pleural effusion, not elsewhere classified: Secondary | ICD-10-CM | POA: Diagnosis not present

## 2021-01-15 DIAGNOSIS — N186 End stage renal disease: Secondary | ICD-10-CM | POA: Diagnosis not present

## 2021-01-15 DIAGNOSIS — R609 Edema, unspecified: Secondary | ICD-10-CM

## 2021-01-15 DIAGNOSIS — M7989 Other specified soft tissue disorders: Secondary | ICD-10-CM | POA: Diagnosis not present

## 2021-01-15 DIAGNOSIS — E1165 Type 2 diabetes mellitus with hyperglycemia: Secondary | ICD-10-CM | POA: Diagnosis present

## 2021-01-15 DIAGNOSIS — I330 Acute and subacute infective endocarditis: Secondary | ICD-10-CM | POA: Diagnosis present

## 2021-01-15 DIAGNOSIS — Z841 Family history of disorders of kidney and ureter: Secondary | ICD-10-CM

## 2021-01-15 DIAGNOSIS — R531 Weakness: Secondary | ICD-10-CM | POA: Diagnosis not present

## 2021-01-15 DIAGNOSIS — A0472 Enterocolitis due to Clostridium difficile, not specified as recurrent: Secondary | ICD-10-CM | POA: Diagnosis not present

## 2021-01-15 DIAGNOSIS — K3184 Gastroparesis: Secondary | ICD-10-CM | POA: Diagnosis present

## 2021-01-15 DIAGNOSIS — N179 Acute kidney failure, unspecified: Secondary | ICD-10-CM | POA: Diagnosis present

## 2021-01-15 DIAGNOSIS — R739 Hyperglycemia, unspecified: Secondary | ICD-10-CM

## 2021-01-15 DIAGNOSIS — R9431 Abnormal electrocardiogram [ECG] [EKG]: Secondary | ICD-10-CM | POA: Diagnosis not present

## 2021-01-15 DIAGNOSIS — M5126 Other intervertebral disc displacement, lumbar region: Secondary | ICD-10-CM | POA: Diagnosis not present

## 2021-01-15 DIAGNOSIS — N2581 Secondary hyperparathyroidism of renal origin: Secondary | ICD-10-CM | POA: Diagnosis present

## 2021-01-15 DIAGNOSIS — E860 Dehydration: Secondary | ICD-10-CM

## 2021-01-15 DIAGNOSIS — E876 Hypokalemia: Secondary | ICD-10-CM | POA: Diagnosis not present

## 2021-01-15 DIAGNOSIS — I4891 Unspecified atrial fibrillation: Secondary | ICD-10-CM | POA: Diagnosis present

## 2021-01-15 DIAGNOSIS — R1310 Dysphagia, unspecified: Secondary | ICD-10-CM | POA: Diagnosis not present

## 2021-01-15 DIAGNOSIS — R600 Localized edema: Secondary | ICD-10-CM | POA: Diagnosis present

## 2021-01-15 DIAGNOSIS — B9561 Methicillin susceptible Staphylococcus aureus infection as the cause of diseases classified elsewhere: Secondary | ICD-10-CM | POA: Diagnosis present

## 2021-01-15 DIAGNOSIS — R6521 Severe sepsis with septic shock: Secondary | ICD-10-CM | POA: Diagnosis not present

## 2021-01-15 DIAGNOSIS — E8770 Fluid overload, unspecified: Secondary | ICD-10-CM | POA: Diagnosis present

## 2021-01-15 DIAGNOSIS — E861 Hypovolemia: Secondary | ICD-10-CM | POA: Diagnosis present

## 2021-01-15 DIAGNOSIS — A419 Sepsis, unspecified organism: Secondary | ICD-10-CM | POA: Diagnosis present

## 2021-01-15 DIAGNOSIS — I76 Septic arterial embolism: Secondary | ICD-10-CM | POA: Diagnosis not present

## 2021-01-15 DIAGNOSIS — R109 Unspecified abdominal pain: Secondary | ICD-10-CM | POA: Diagnosis not present

## 2021-01-15 DIAGNOSIS — R066 Hiccough: Secondary | ICD-10-CM

## 2021-01-15 DIAGNOSIS — Z981 Arthrodesis status: Secondary | ICD-10-CM

## 2021-01-15 DIAGNOSIS — M542 Cervicalgia: Secondary | ICD-10-CM | POA: Diagnosis not present

## 2021-01-15 DIAGNOSIS — R197 Diarrhea, unspecified: Secondary | ICD-10-CM

## 2021-01-15 DIAGNOSIS — D631 Anemia in chronic kidney disease: Secondary | ICD-10-CM | POA: Diagnosis present

## 2021-01-15 DIAGNOSIS — E1122 Type 2 diabetes mellitus with diabetic chronic kidney disease: Secondary | ICD-10-CM | POA: Diagnosis present

## 2021-01-15 DIAGNOSIS — K828 Other specified diseases of gallbladder: Secondary | ICD-10-CM | POA: Diagnosis not present

## 2021-01-15 DIAGNOSIS — E872 Acidosis: Secondary | ICD-10-CM | POA: Diagnosis present

## 2021-01-15 DIAGNOSIS — M545 Low back pain, unspecified: Secondary | ICD-10-CM | POA: Diagnosis present

## 2021-01-15 DIAGNOSIS — E871 Hypo-osmolality and hyponatremia: Secondary | ICD-10-CM | POA: Diagnosis present

## 2021-01-15 DIAGNOSIS — D6959 Other secondary thrombocytopenia: Secondary | ICD-10-CM | POA: Diagnosis present

## 2021-01-15 DIAGNOSIS — A4101 Sepsis due to Methicillin susceptible Staphylococcus aureus: Secondary | ICD-10-CM | POA: Diagnosis not present

## 2021-01-15 DIAGNOSIS — D638 Anemia in other chronic diseases classified elsewhere: Secondary | ICD-10-CM

## 2021-01-15 DIAGNOSIS — I081 Rheumatic disorders of both mitral and tricuspid valves: Secondary | ICD-10-CM | POA: Diagnosis not present

## 2021-01-15 DIAGNOSIS — I252 Old myocardial infarction: Secondary | ICD-10-CM

## 2021-01-15 DIAGNOSIS — G9341 Metabolic encephalopathy: Secondary | ICD-10-CM | POA: Diagnosis present

## 2021-01-15 DIAGNOSIS — K8020 Calculus of gallbladder without cholecystitis without obstruction: Secondary | ICD-10-CM

## 2021-01-15 DIAGNOSIS — Z87891 Personal history of nicotine dependence: Secondary | ICD-10-CM

## 2021-01-15 DIAGNOSIS — I2690 Septic pulmonary embolism without acute cor pulmonale: Secondary | ICD-10-CM | POA: Diagnosis present

## 2021-01-15 DIAGNOSIS — I700 Atherosclerosis of aorta: Secondary | ICD-10-CM | POA: Diagnosis not present

## 2021-01-15 DIAGNOSIS — R579 Shock, unspecified: Secondary | ICD-10-CM | POA: Diagnosis present

## 2021-01-15 DIAGNOSIS — I120 Hypertensive chronic kidney disease with stage 5 chronic kidney disease or end stage renal disease: Secondary | ICD-10-CM | POA: Diagnosis not present

## 2021-01-15 DIAGNOSIS — M7130 Other bursal cyst, unspecified site: Secondary | ICD-10-CM | POA: Diagnosis present

## 2021-01-15 DIAGNOSIS — Z833 Family history of diabetes mellitus: Secondary | ICD-10-CM

## 2021-01-15 DIAGNOSIS — G8929 Other chronic pain: Secondary | ICD-10-CM | POA: Diagnosis present

## 2021-01-15 DIAGNOSIS — R110 Nausea: Secondary | ICD-10-CM | POA: Diagnosis not present

## 2021-01-15 DIAGNOSIS — Z79899 Other long term (current) drug therapy: Secondary | ICD-10-CM

## 2021-01-15 DIAGNOSIS — K5900 Constipation, unspecified: Secondary | ICD-10-CM | POA: Diagnosis not present

## 2021-01-15 DIAGNOSIS — Z8249 Family history of ischemic heart disease and other diseases of the circulatory system: Secondary | ICD-10-CM

## 2021-01-15 DIAGNOSIS — M546 Pain in thoracic spine: Secondary | ICD-10-CM | POA: Diagnosis not present

## 2021-01-15 DIAGNOSIS — E1129 Type 2 diabetes mellitus with other diabetic kidney complication: Secondary | ICD-10-CM | POA: Diagnosis not present

## 2021-01-15 DIAGNOSIS — E119 Type 2 diabetes mellitus without complications: Secondary | ICD-10-CM

## 2021-01-15 DIAGNOSIS — M47816 Spondylosis without myelopathy or radiculopathy, lumbar region: Secondary | ICD-10-CM | POA: Diagnosis not present

## 2021-01-15 DIAGNOSIS — M47812 Spondylosis without myelopathy or radiculopathy, cervical region: Secondary | ICD-10-CM | POA: Diagnosis not present

## 2021-01-15 LAB — COMPREHENSIVE METABOLIC PANEL
ALT: 29 U/L (ref 0–44)
ALT: 27 U/L (ref 0–44)
AST: 42 U/L — ABNORMAL HIGH (ref 15–41)
AST: 55 U/L — ABNORMAL HIGH (ref 15–41)
Albumin: 3.7 g/dL (ref 3.5–5.0)
Albumin: 3.1 g/dL — ABNORMAL LOW (ref 3.5–5.0)
Alkaline Phosphatase: 100 U/L (ref 38–126)
Alkaline Phosphatase: 92 U/L (ref 38–126)
Anion gap: 23 — ABNORMAL HIGH (ref 5–15)
Anion gap: 18 — ABNORMAL HIGH (ref 5–15)
BUN: 79 mg/dL — ABNORMAL HIGH (ref 8–23)
BUN: 95 mg/dL — ABNORMAL HIGH (ref 8–23)
CO2: 19 mmol/L — ABNORMAL LOW (ref 22–32)
CO2: 21 mmol/L — ABNORMAL LOW (ref 22–32)
Calcium: 8.5 mg/dL — ABNORMAL LOW (ref 8.9–10.3)
Calcium: 7.8 mg/dL — ABNORMAL LOW (ref 8.9–10.3)
Chloride: 83 mmol/L — ABNORMAL LOW (ref 98–111)
Chloride: 89 mmol/L — ABNORMAL LOW (ref 98–111)
Creatinine, Ser: 13.72 mg/dL — ABNORMAL HIGH (ref 0.61–1.24)
Creatinine, Ser: 14.22 mg/dL — ABNORMAL HIGH (ref 0.61–1.24)
GFR, Estimated: 4 mL/min — ABNORMAL LOW (ref >60)
GFR, Estimated: 3 mL/min — ABNORMAL LOW (ref >60)
Glucose, Bld: 342 mg/dL — ABNORMAL HIGH (ref 70–99)
Glucose, Bld: 182 mg/dL — ABNORMAL HIGH (ref 70–99)
Potassium: 4.1 mmol/L (ref 3.5–5.1)
Potassium: 4.5 mmol/L (ref 3.5–5.1)
Sodium: 125 mmol/L — ABNORMAL LOW (ref 135–145)
Sodium: 128 mmol/L — ABNORMAL LOW (ref 135–145)
Total Bilirubin: 1.3 mg/dL — ABNORMAL HIGH (ref 0.3–1.2)
Total Bilirubin: 1.3 mg/dL — ABNORMAL HIGH (ref 0.3–1.2)
Total Protein: 8.2 g/dL — ABNORMAL HIGH (ref 6.5–8.1)
Total Protein: 6.7 g/dL (ref 6.5–8.1)

## 2021-01-15 LAB — CBC WITH DIFFERENTIAL/PLATELET
Abs Immature Granulocytes: 0.06 10*3/uL (ref 0.00–0.07)
Basophils Absolute: 0 10*3/uL (ref 0.0–0.1)
Basophils Relative: 0 %
Eosinophils Absolute: 0 10*3/uL (ref 0.0–0.5)
Eosinophils Relative: 0 %
HCT: 34.3 % — ABNORMAL LOW (ref 39.0–52.0)
Hemoglobin: 11.5 g/dL — ABNORMAL LOW (ref 13.0–17.0)
Immature Granulocytes: 1 %
Lymphocytes Relative: 4 %
Lymphs Abs: 0.4 10*3/uL — ABNORMAL LOW (ref 0.7–4.0)
MCH: 31.3 pg (ref 26.0–34.0)
MCHC: 33.5 g/dL (ref 30.0–36.0)
MCV: 93.5 fL (ref 80.0–100.0)
Monocytes Absolute: 0.6 10*3/uL (ref 0.1–1.0)
Monocytes Relative: 6 %
Neutro Abs: 9 10*3/uL — ABNORMAL HIGH (ref 1.7–7.7)
Neutrophils Relative %: 89 %
Platelets: 93 10*3/uL — ABNORMAL LOW (ref 150–400)
RBC: 3.67 MIL/uL — ABNORMAL LOW (ref 4.22–5.81)
RDW: 13.3 % (ref 11.5–15.5)
WBC: 10 10*3/uL (ref 4.0–10.5)
nRBC: 0 % (ref 0.0–0.2)

## 2021-01-15 LAB — I-STAT VENOUS BLOOD GAS, ED
Acid-base deficit: 1 mmol/L (ref 0.0–2.0)
Bicarbonate: 23.4 mmol/L (ref 20.0–28.0)
Calcium, Ion: 0.94 mmol/L — ABNORMAL LOW (ref 1.15–1.40)
HCT: 30 % — ABNORMAL LOW (ref 39.0–52.0)
Hemoglobin: 10.2 g/dL — ABNORMAL LOW (ref 13.0–17.0)
O2 Saturation: 56 %
Potassium: 4 mmol/L (ref 3.5–5.1)
Sodium: 128 mmol/L — ABNORMAL LOW (ref 135–145)
TCO2: 25 mmol/L (ref 22–32)
pCO2, Ven: 38.3 mmHg — ABNORMAL LOW (ref 44.0–60.0)
pH, Ven: 7.393 (ref 7.250–7.430)
pO2, Ven: 29 mmHg — CL (ref 32.0–45.0)

## 2021-01-15 LAB — ETHANOL: Alcohol, Ethyl (B): <10 mg/dL (ref <10)

## 2021-01-15 LAB — CBG MONITORING, ED
Glucose-Capillary: 327 mg/dL — ABNORMAL HIGH (ref 70–99)
Glucose-Capillary: 165 mg/dL — ABNORMAL HIGH (ref 70–99)
Glucose-Capillary: 194 mg/dL — ABNORMAL HIGH (ref 70–99)
Glucose-Capillary: 198 mg/dL — ABNORMAL HIGH (ref 70–99)
Glucose-Capillary: 202 mg/dL — ABNORMAL HIGH (ref 70–99)

## 2021-01-15 LAB — CBC
HCT: 34.9 % — ABNORMAL LOW (ref 39.0–52.0)
Hemoglobin: 11.9 g/dL — ABNORMAL LOW (ref 13.0–17.0)
MCH: 32.2 pg (ref 26.0–34.0)
MCHC: 34.1 g/dL (ref 30.0–36.0)
MCV: 94.3 fL (ref 80.0–100.0)
Platelets: 104 10*3/uL — ABNORMAL LOW (ref 150–400)
RBC: 3.7 MIL/uL — ABNORMAL LOW (ref 4.22–5.81)
RDW: 13.3 % (ref 11.5–15.5)
WBC: 15.3 10*3/uL — ABNORMAL HIGH (ref 4.0–10.5)
nRBC: 0 % (ref 0.0–0.2)

## 2021-01-15 LAB — RESP PANEL BY RT-PCR (FLU A&B, COVID) ARPGX2
Influenza A by PCR: NEGATIVE
Influenza B by PCR: NEGATIVE
SARS Coronavirus 2 by RT PCR: NEGATIVE

## 2021-01-15 LAB — LIPASE, BLOOD: Lipase: 33 U/L (ref 11–51)

## 2021-01-15 LAB — BETA-HYDROXYBUTYRIC ACID: Beta-Hydroxybutyric Acid: 0.42 mmol/L — ABNORMAL HIGH (ref 0.05–0.27)

## 2021-01-15 LAB — PHOSPHORUS: Phosphorus: 4.5 mg/dL (ref 2.5–4.6)

## 2021-01-15 LAB — HIV ANTIBODY (ROUTINE TESTING W REFLEX): HIV Screen 4th Generation wRfx: NONREACTIVE

## 2021-01-15 LAB — MAGNESIUM: Magnesium: 2.2 mg/dL (ref 1.7–2.4)

## 2021-01-15 LAB — TROPONIN I (HIGH SENSITIVITY): Troponin I (High Sensitivity): 114 ng/L (ref <18)

## 2021-01-15 LAB — LACTIC ACID, PLASMA: Lactic Acid, Venous: 2.2 mmol/L (ref 0.5–1.9)

## 2021-01-15 IMAGING — DX DG ELBOW 2V*L*
2 series · 2 of 2 positions shown · non-contrast
Comparison: None.

CLINICAL DATA: Left elbow swelling

EXAM:
LEFT ELBOW - 2 VIEW

[elbow ap]
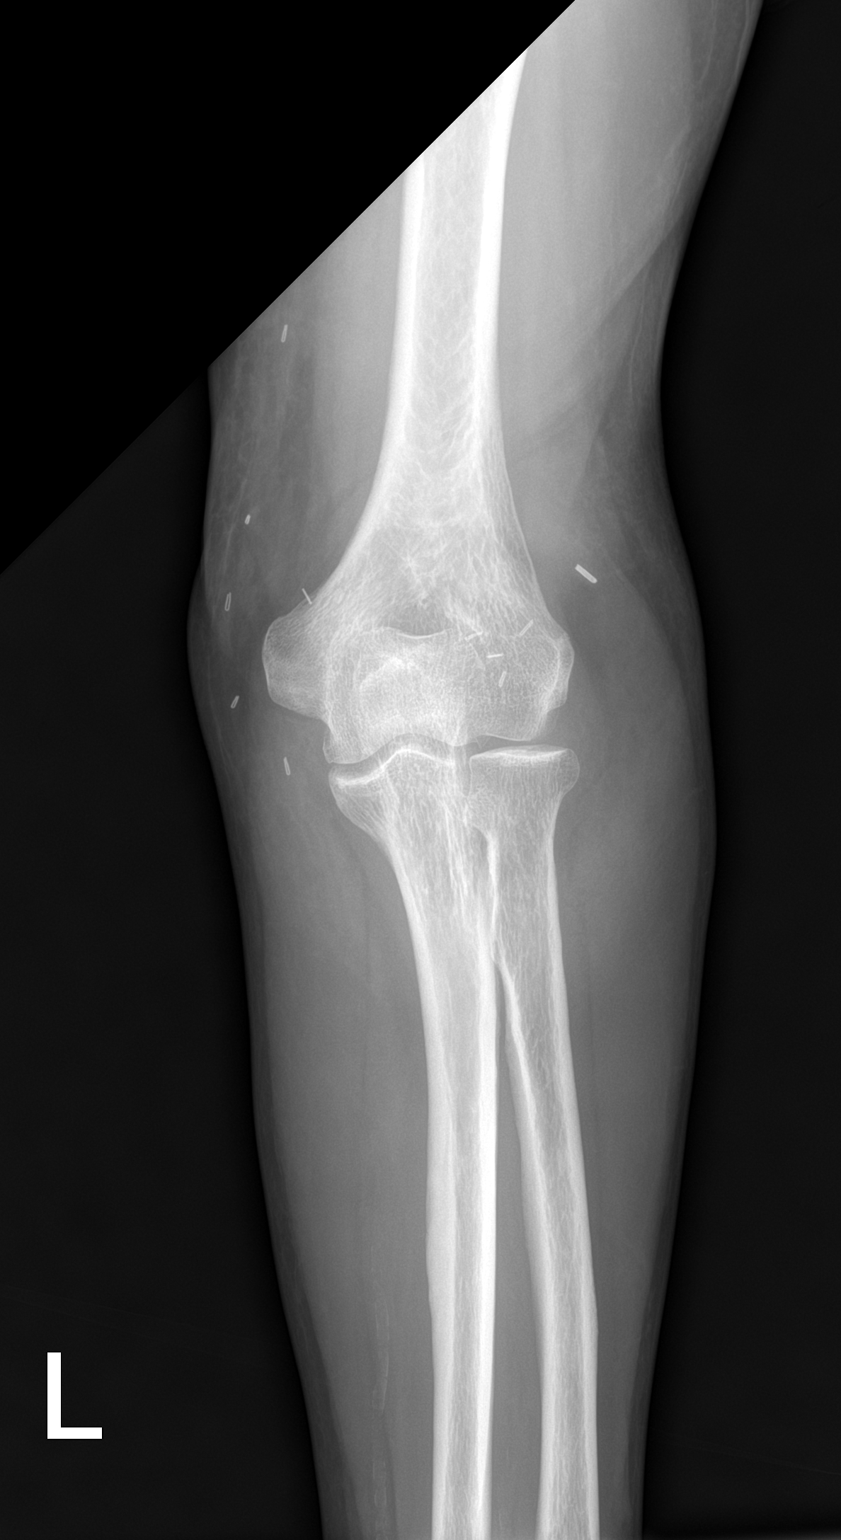

[elbow lat]
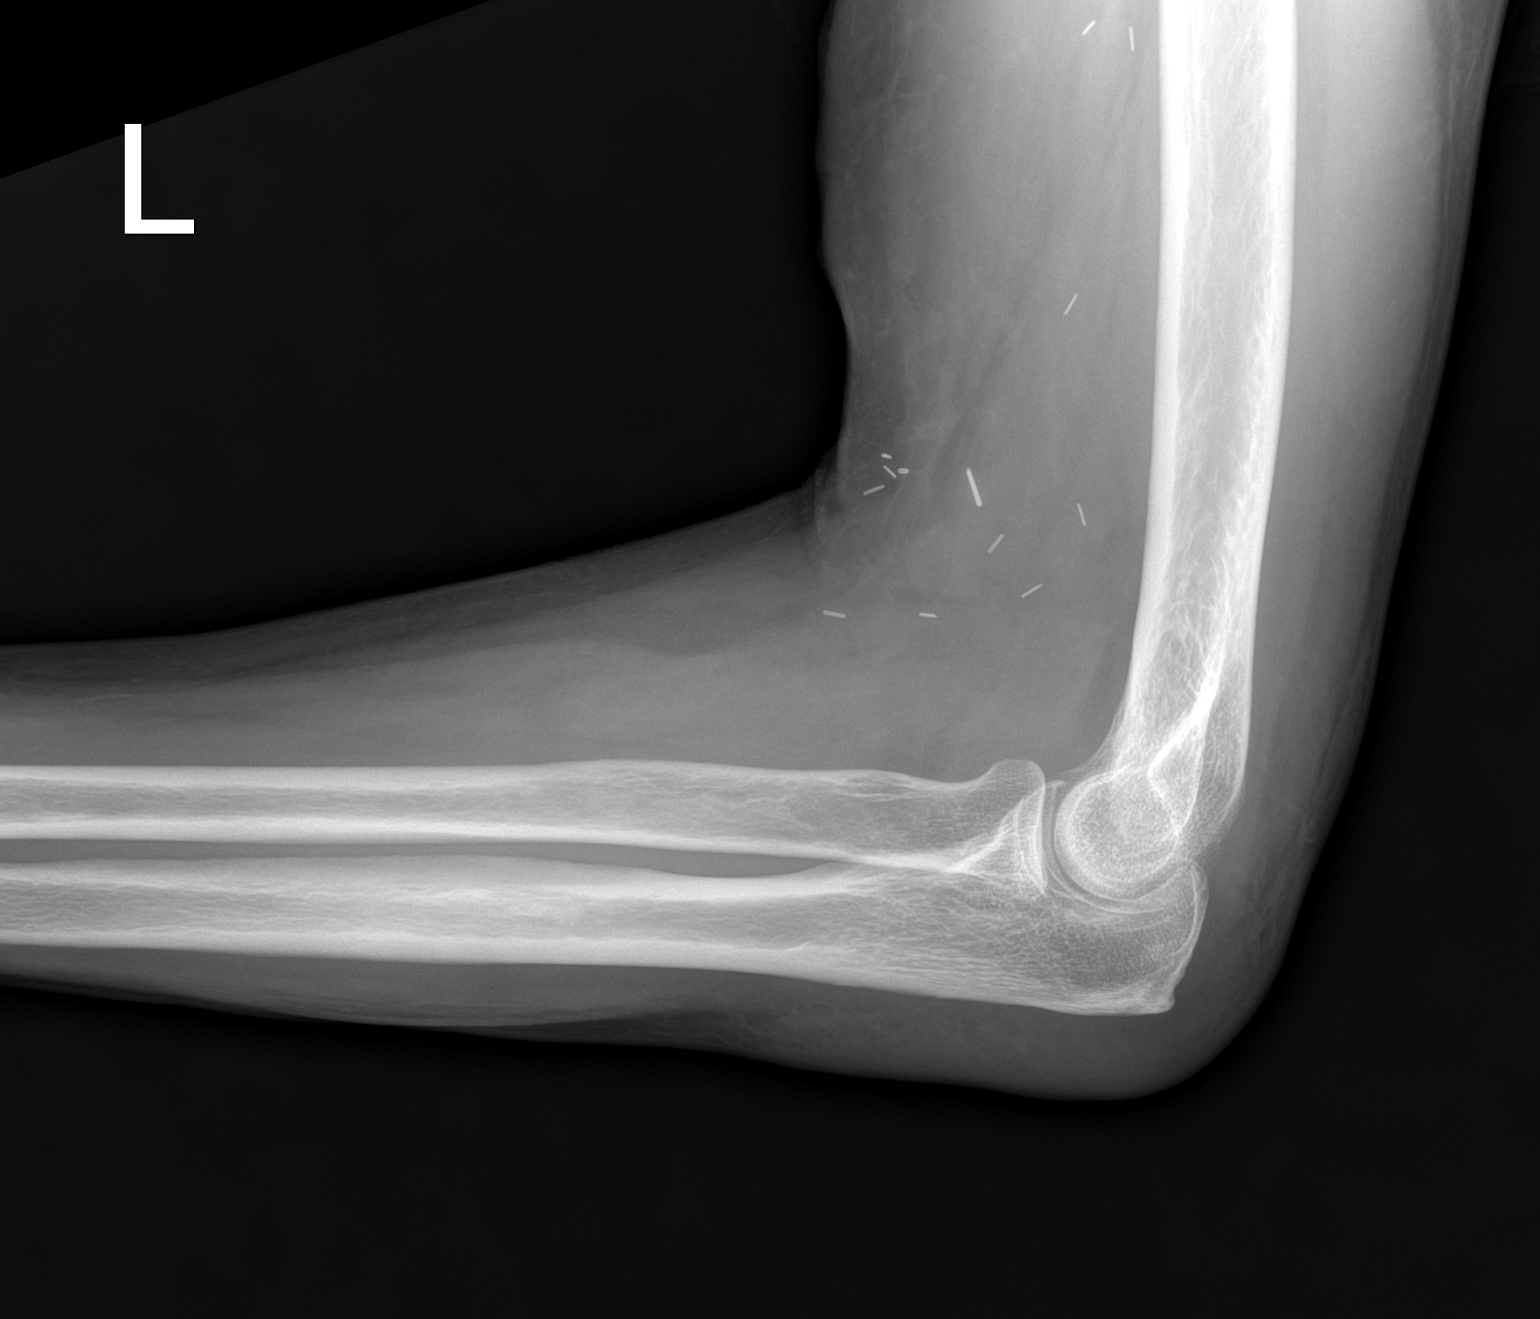

[2 of 2 positions shown; findings below may reference images not displayed]

FINDINGS: No fracture or dislocation is seen.

The joint spaces are preserved.

No displaced elbow joint fat pads suggest an elbow joint effusion.

Mild soft tissue swelling overlying the olecranon. In the absence of
trauma, this appearance suggests olecranon bursitis.

Surgical clips in the visualized soft tissues.
IMPRESSION: Mild soft tissue swelling overlying the olecranon. In the absence of
trauma, this appearance suggests olecranon bursitis.

## 2021-01-15 IMAGING — CT CT ABD-PELV W/O CM
2 of 4 series · 16 of 46 positions shown, 18 images · non-contrast
Comparison: [DATE].

CLINICAL DATA: Acute abdominal pain, nonlocalized abdominal pain in
a 65-year-old male.

EXAM:
CT ABDOMEN AND PELVIS WITHOUT CONTRAST
TECHNIQUE: Multidetector CT imaging of the abdomen and pelvis was performed
following the standard protocol without IV contrast.

[Series 3: a/p w/o 5mm · axial · non-contrast · 0.72mm/px · z∈[+624,+1034]mm · 13 of 90 slices shown, 15 images]
[im 4/90  soft-tissue]
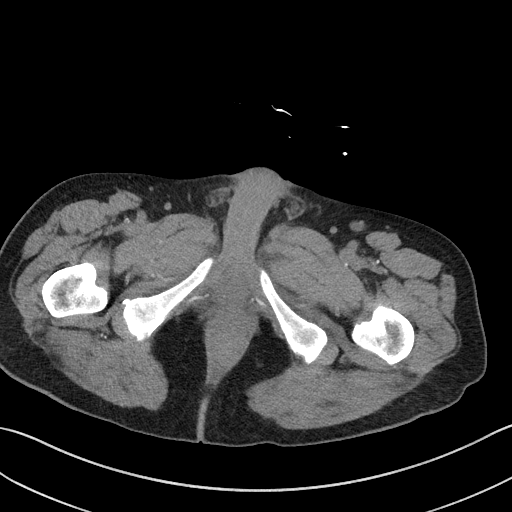
[im 4/90  bone]
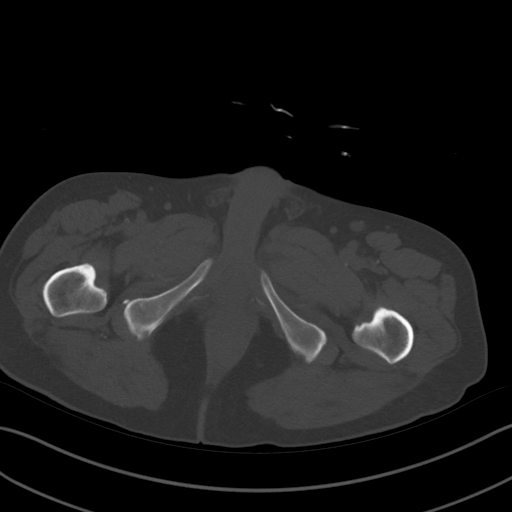
[im 12/90  soft-tissue]
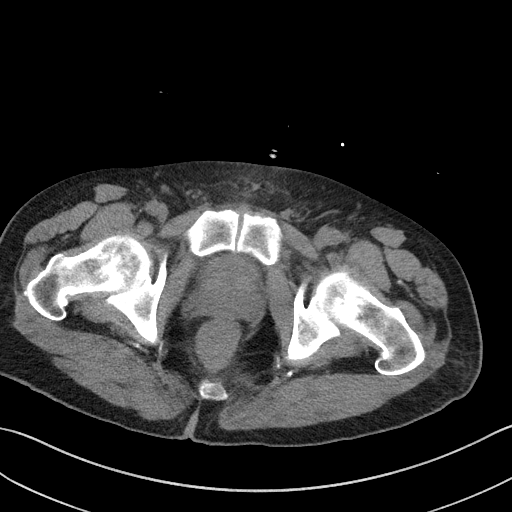
[im 19/90  soft-tissue]
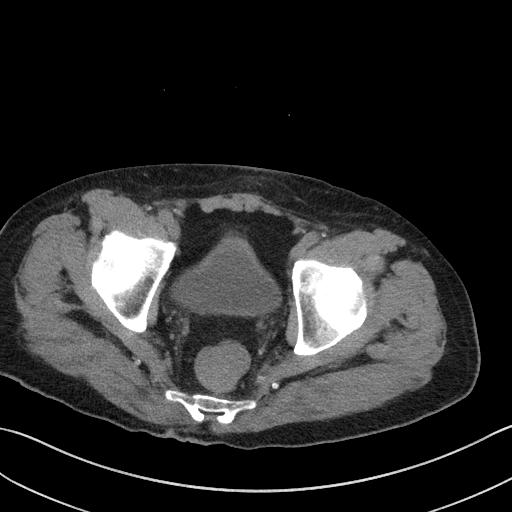
[im 26/90  soft-tissue]
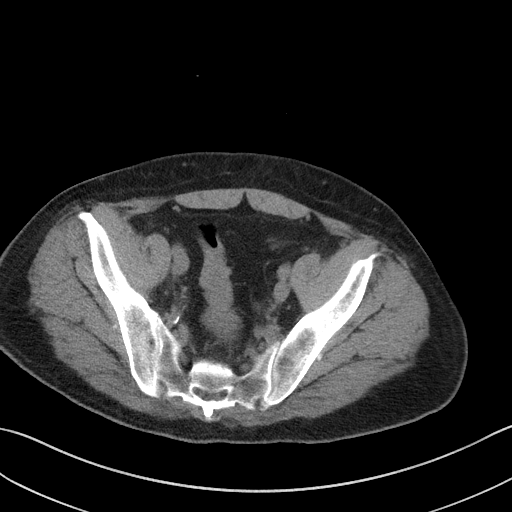
[im 30/90  soft-tissue]
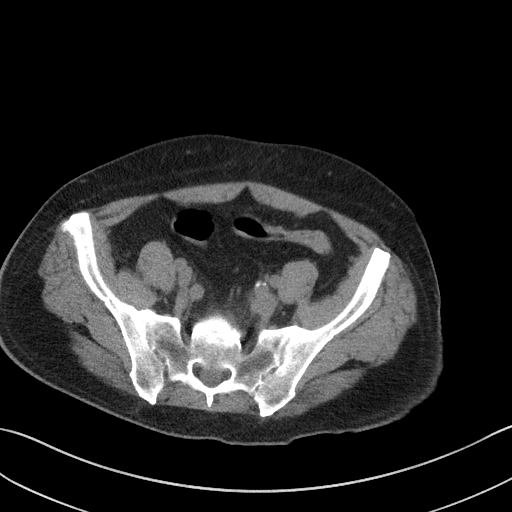
[im 38/90  soft-tissue]
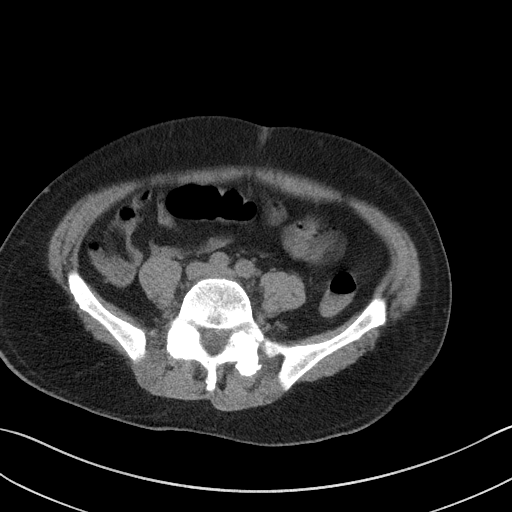
[im 45/90  soft-tissue]
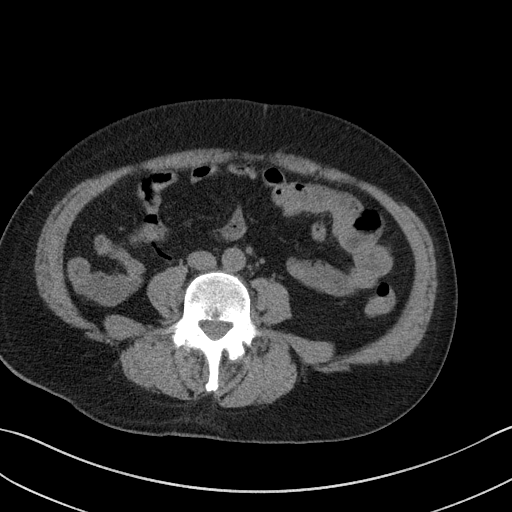
[im 52/90  soft-tissue]
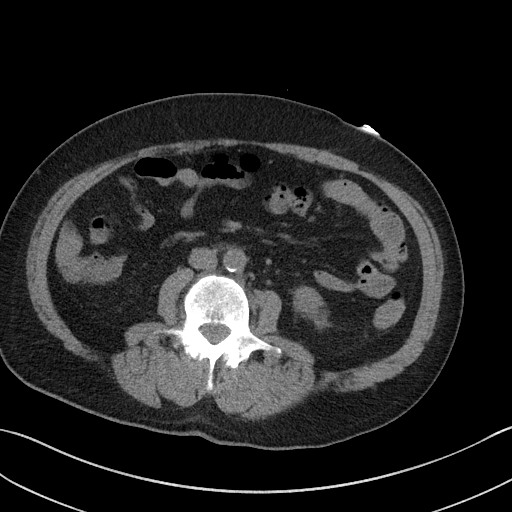
[im 60/90  soft-tissue]
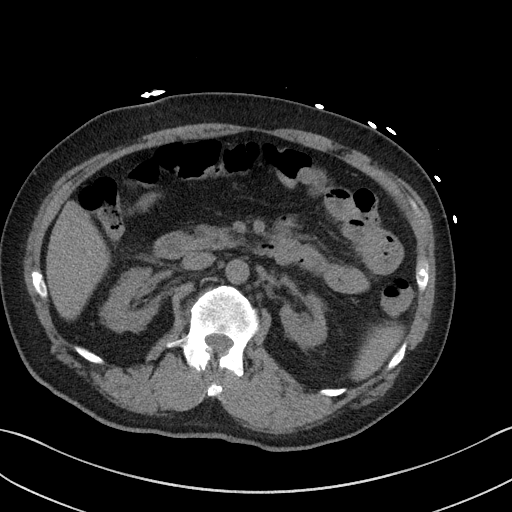
[im 60/90  bone]
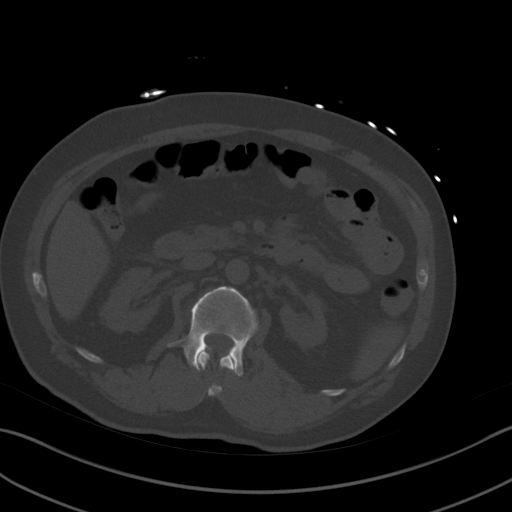
[im 64/90  soft-tissue]
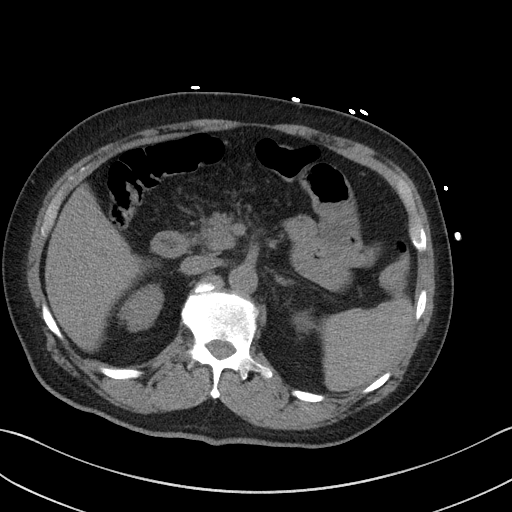
[im 71/90  soft-tissue]
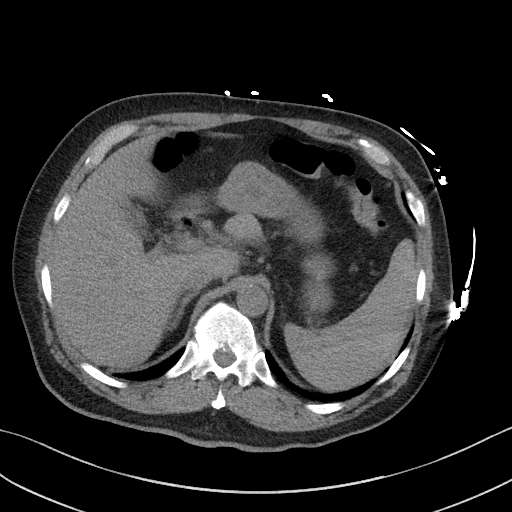
[im 78/90  soft-tissue]
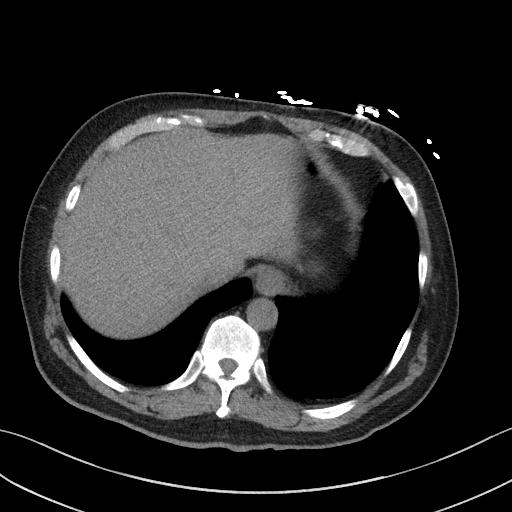
[im 86/90  soft-tissue]
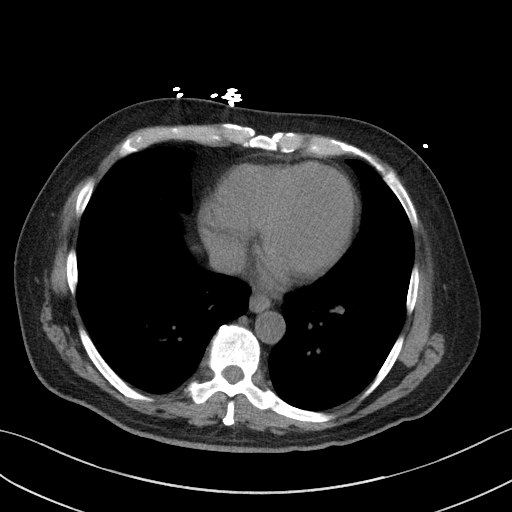

[Series 6: a/p w/o cor · coronal · non-contrast · 0.76mm/px · 3 of 151 slices shown]
[im 51/151  soft-tissue]
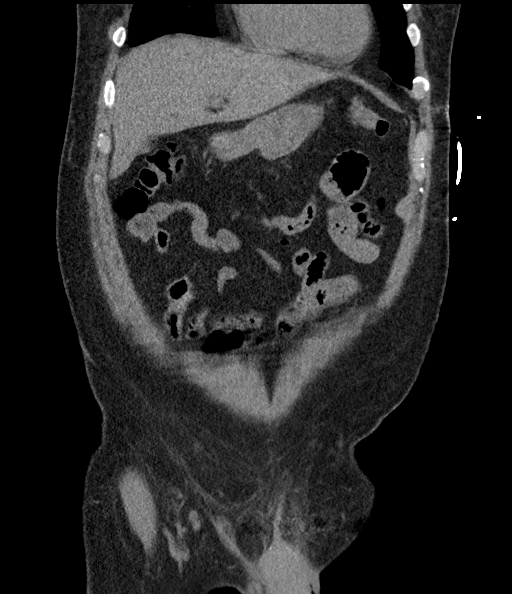
[im 67/151  soft-tissue]
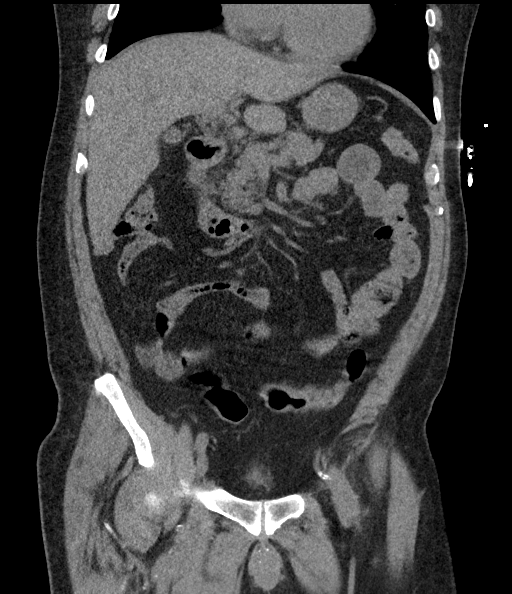
[im 84/151  soft-tissue]
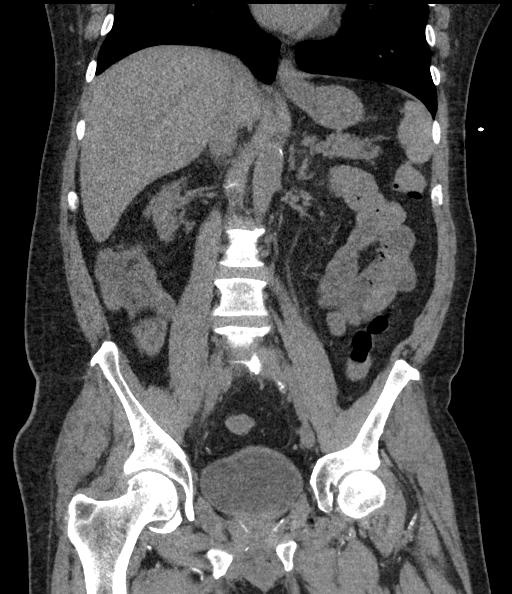

[16 of 46 positions shown; findings below may reference images not displayed]

FINDINGS: Lower chest: Bilateral nodular opacities somewhat ill-defined. Area
with ground-glass features and bandlike appearance on image 15 of
series 4 measuring 11 x 7 mm.

Slightly denser more nodular area just above the LEFT hemidiaphragm
on image 16 of series 4 measuring 11 x 6 mm.

10 x 9 mm basilar opacity with ground-glass features in the RIGHT
lower lobe (image [DATE]) no effusion. No consolidative changes.

8 x 4 mm pleural-based area, ground-glass and solid component w.

Hepatobiliary: No focal lesion on noncontrast imaging.
Cholelithiasis. Gallbladder collapse without pericholecystic
stranding.

Pancreas: Pancreas without contour abnormality or signs of
inflammation.

Spleen: Spleen normal size and contour.

Adrenals/Urinary Tract: Adrenal glands are normal.

Scarring of the bilateral kidneys with signs of parenchymal loss
since previous imaging in this patient with end-stage renal disease.
No nephrolithiasis. No ureteral calculi. Urinary bladder is normal.

Stomach/Bowel: The appendix is normal.

No sign of small bowel obstruction. No stranding adjacent to stomach
or colon.

Vascular/Lymphatic: Calcified atheromatous plaque in the abdominal
aorta. No aneurysmal dilation. Smooth contour of the IVC.

There is no gastrohepatic or hepatoduodenal ligament
lymphadenopathy. No retroperitoneal or mesenteric lymphadenopathy.

No pelvic sidewall lymphadenopathy.

Reproductive: Prostate unremarkable by CT.

Other: No ascites.

Musculoskeletal: No acute bone finding. No destructive bone process.
Spinal degenerative changes.
IMPRESSION: 1. No acute findings in the abdomen or pelvis.
2. Bilateral nodular opacities with ground-glass features in the
lung bases. These findings are nonspecific and could potentially
represent infectious or inflammatory changes. Correlate with any
history of infection or respiratory symptoms including [BP]
pneumonia. Suggest 8-12 week follow-up to exclude the possibility of
neoplasm.
3. Cholelithiasis without evidence of acute cholecystitis.
4. Scarring of the bilateral kidneys with signs of parenchymal loss
since previous imaging in this patient with end-stage renal disease.
5. Aortic atherosclerosis.

Aortic Atherosclerosis ([BP]-[BP]).

## 2021-01-15 IMAGING — DX DG CHEST 1V PORT
1 series · 1 of 1 positions shown · non-contrast
Comparison: Chest x-ray [DATE], CT chest [DATE]

CLINICAL DATA: Abdominal pain for 3 days.  Nausea vomiting.

EXAM:
PORTABLE CHEST 1 VIEW

[chest ap]
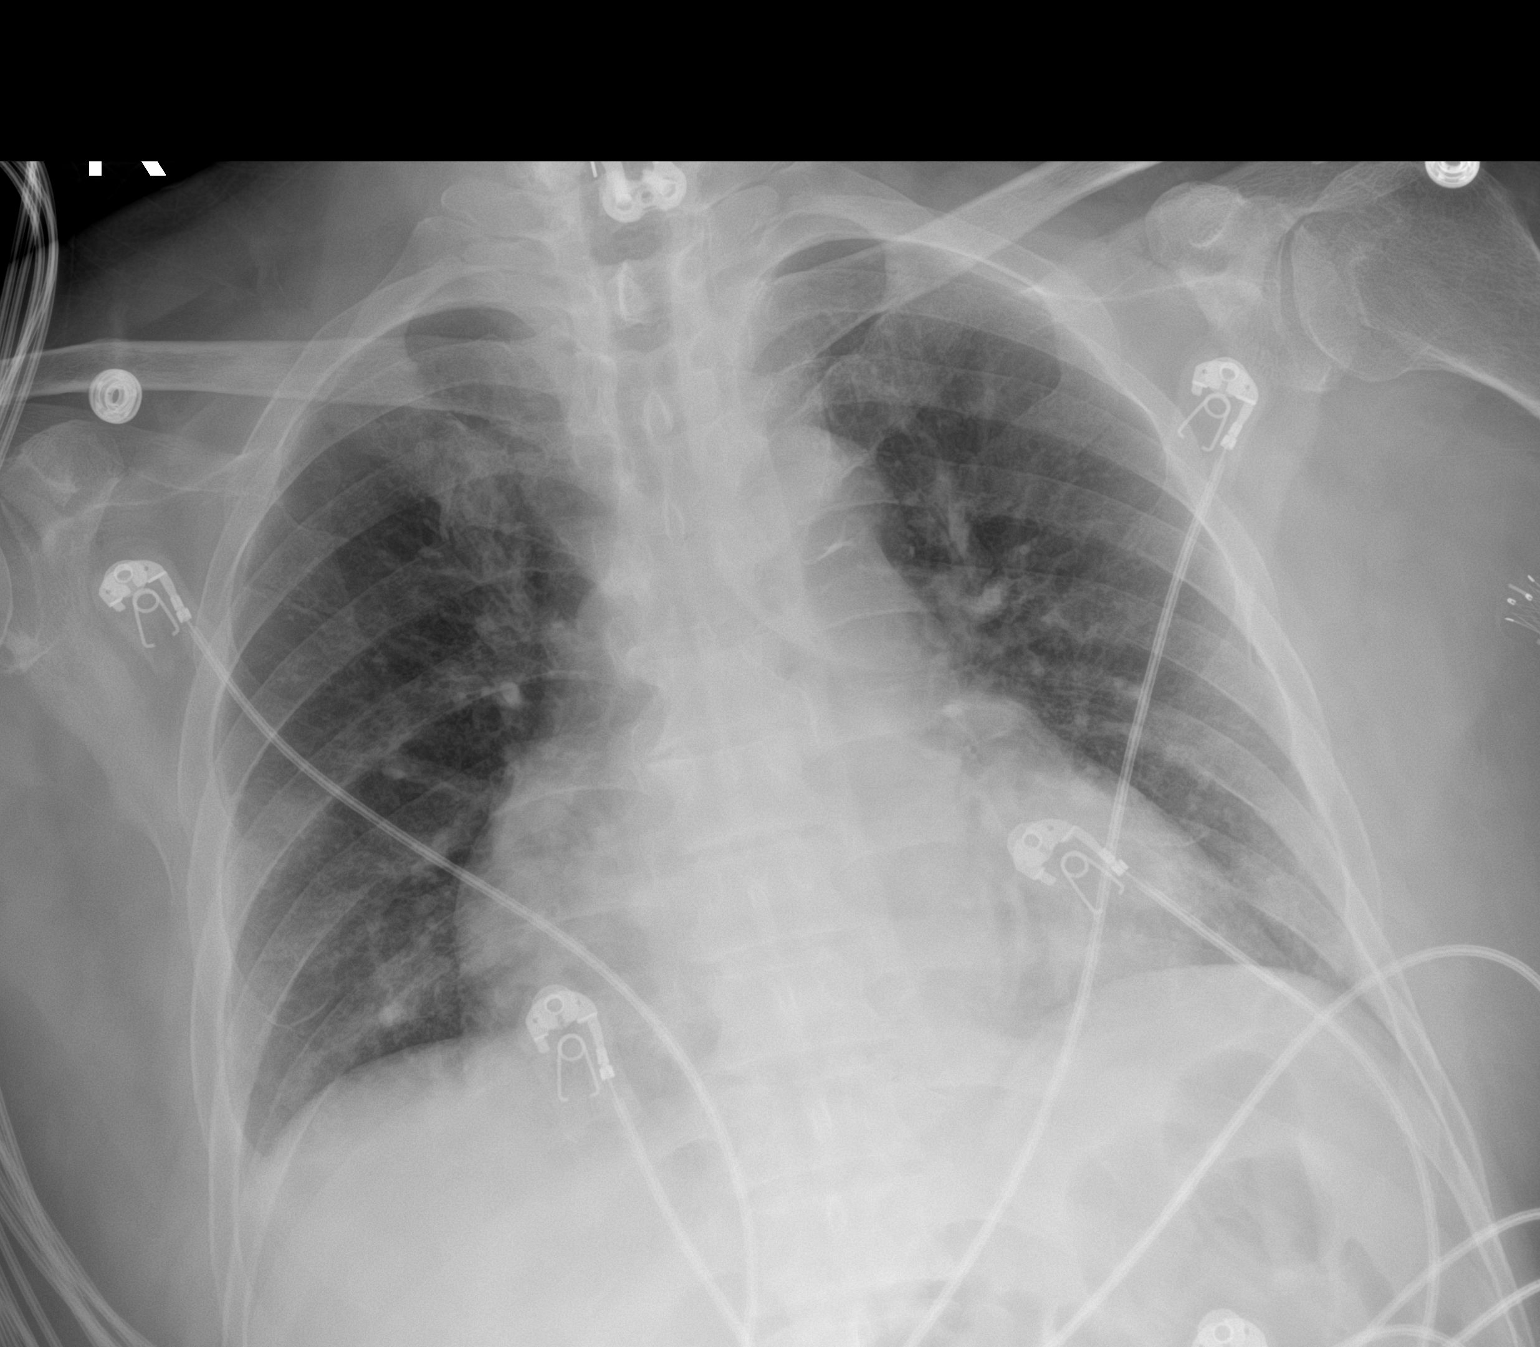

[1 of 1 positions shown; findings below may reference images not displayed]

FINDINGS: Slightly increased in enlarged cardiac silhouette. The heart size
and mediastinal contours are otherwise unchanged. Aortic
calcification.

Bilateral lower lobe patchy airspace opacities most prominent within
the peripheries. No focal consolidation. Similar-appearing slightly
increased interstitial markings. Blunting of the right costophrenic
angle with no definite pleural effusion. No pneumothorax.

No acute osseous abnormality.  Cervical surgical hardware.
IMPRESSION: 1. Bilateral lower lobe patchy airspace opacities that are most
prominent within the peripheries. Findings suggestive of
infection/inflammation. [OV] infection not excluded.
2. Slightly increased in enlarged cardiac silhouette.
3.  Aortic Atherosclerosis ([OV]-[OV]).

## 2021-01-15 IMAGING — US US ABDOMEN LIMITED
1 series · 14 of 25 positions shown · non-contrast
Comparison: CT abdomen and pelvis [DATE]

CLINICAL DATA: Right upper quadrant pain

EXAM:
ULTRASOUND ABDOMEN LIMITED RIGHT UPPER QUADRANT

[Series 1: us abdomen limited · 14 of 43 slices shown]
[im 1/43]
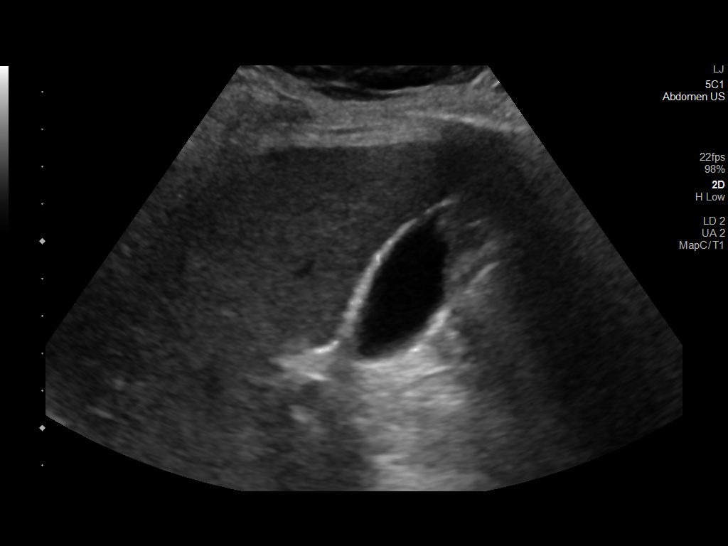
[im 4/43]
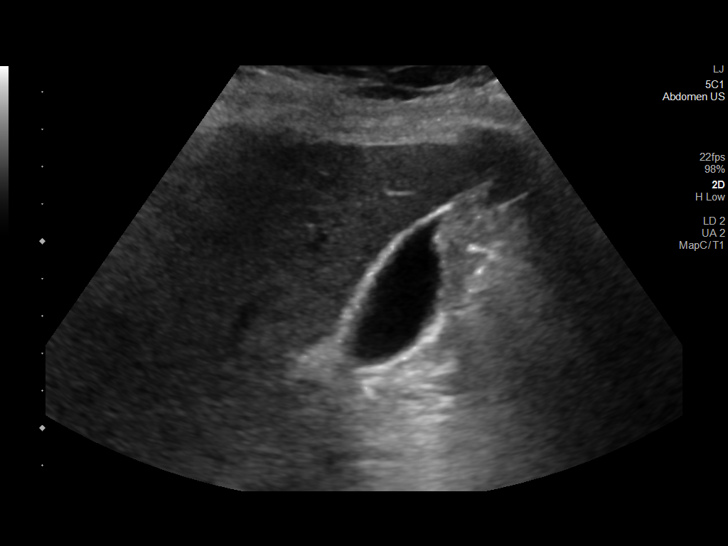
[im 8/43]
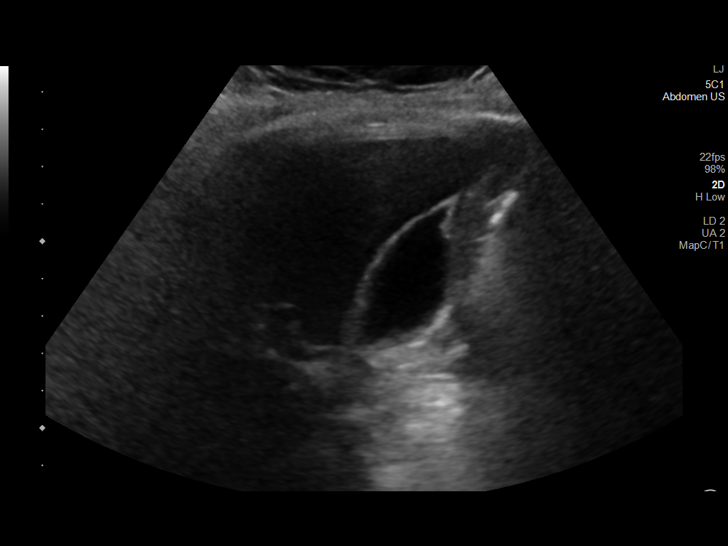
[im 11/43]
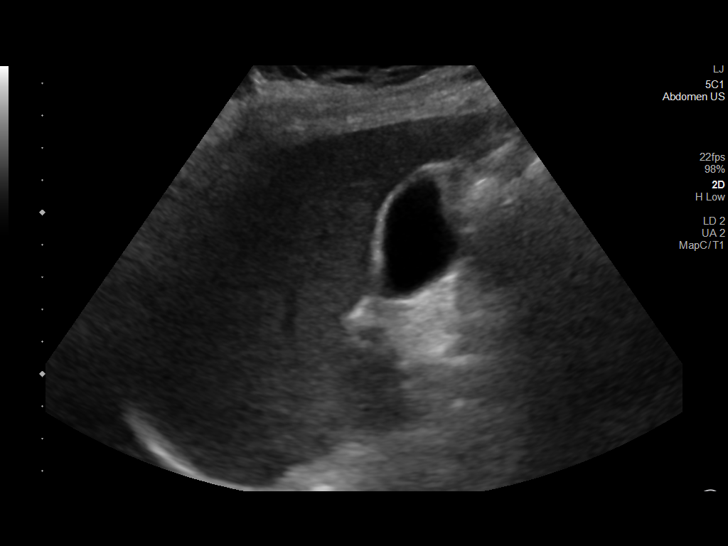
[im 15/43]
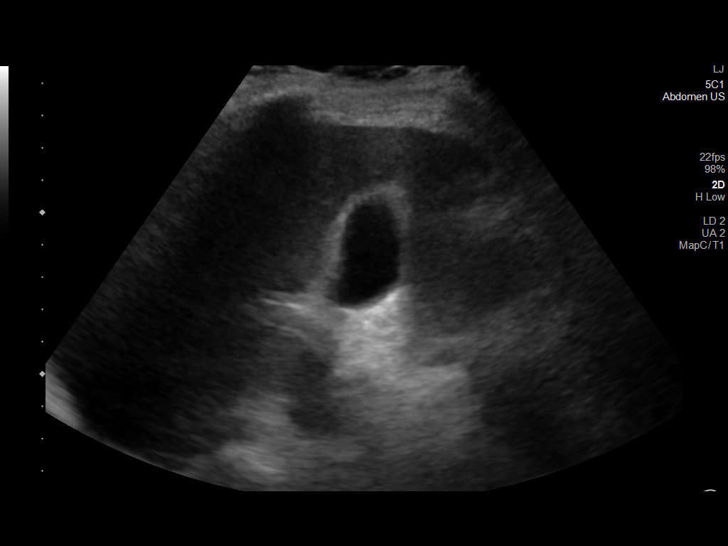
[im 16/43]
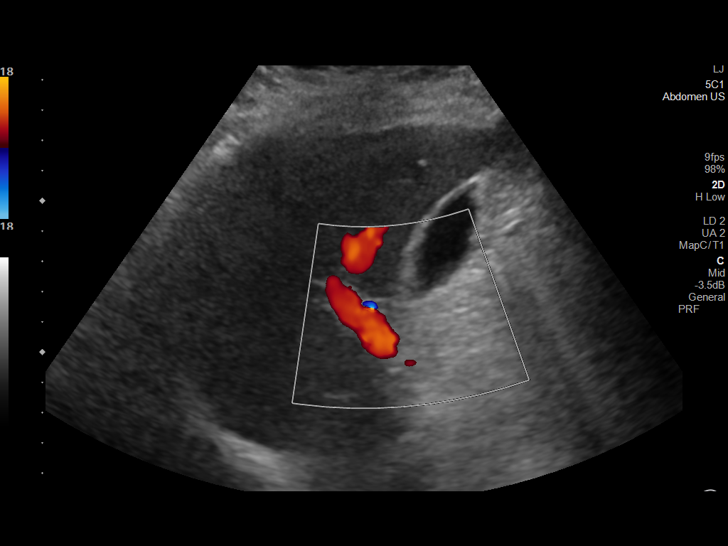
[im 20/43]
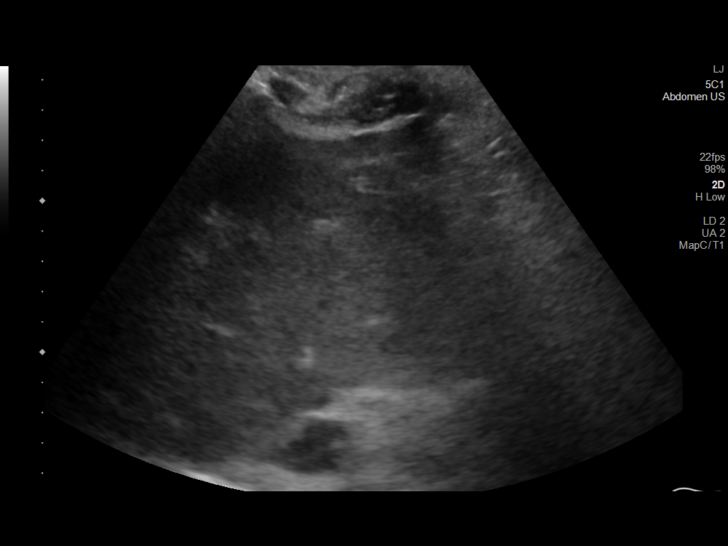
[im 23/43]
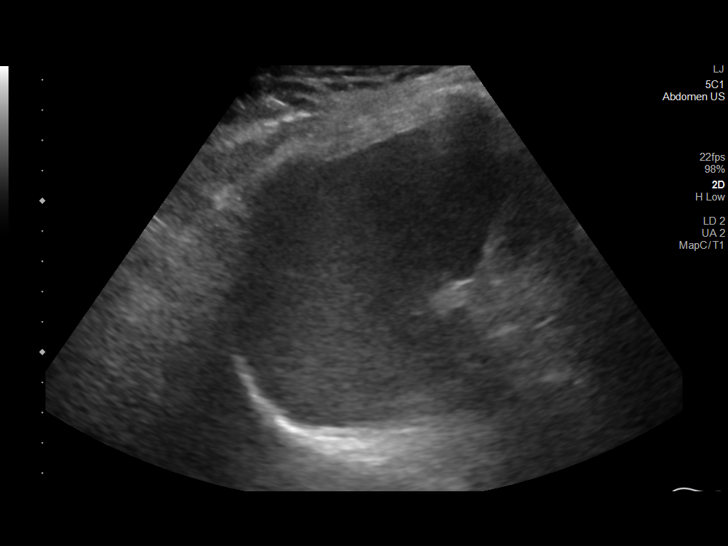
[im 27/43]
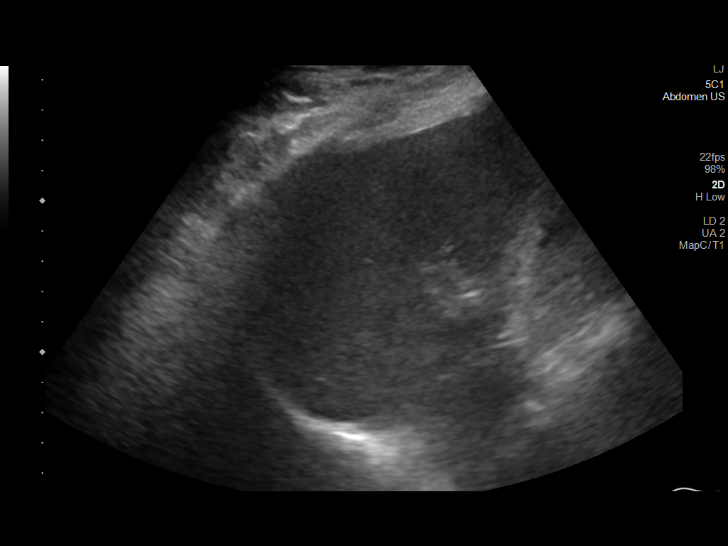
[im 29/43]
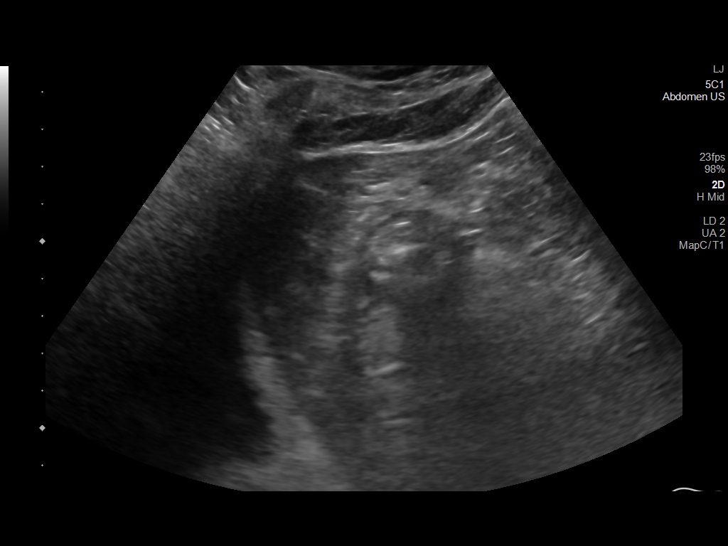
[im 32/43]
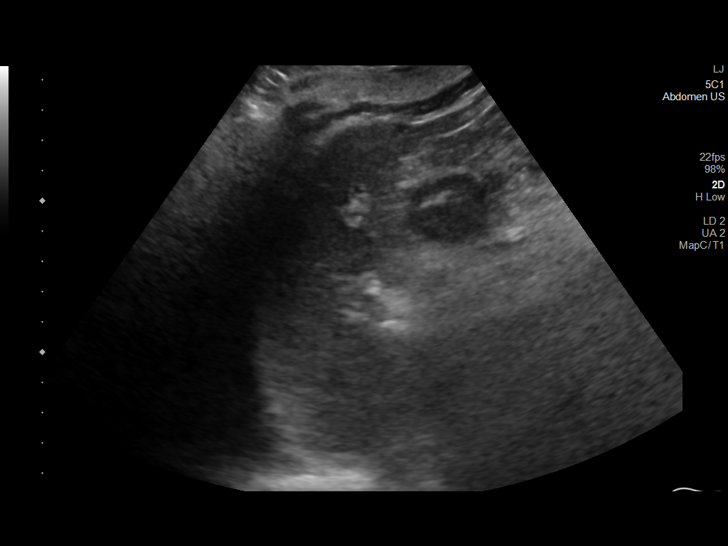
[im 36/43]
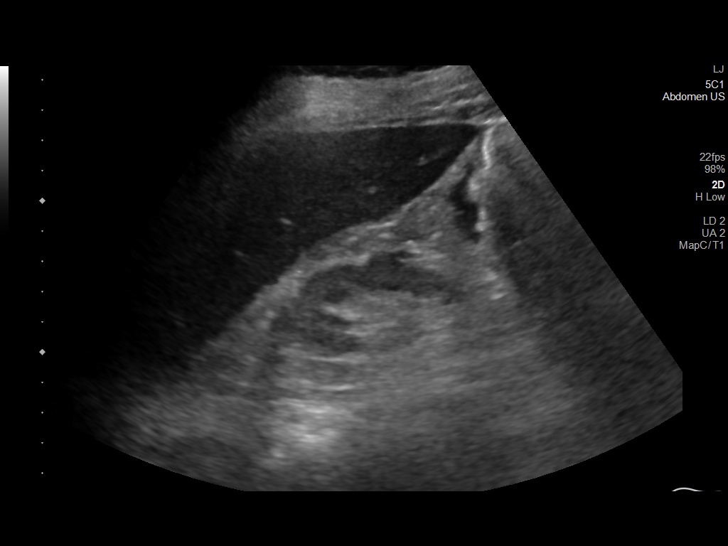
[im 39/43]
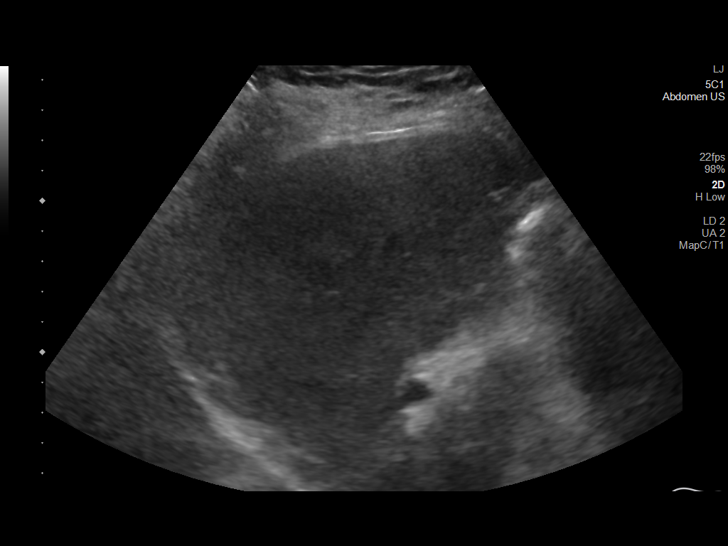
[im 43/43]
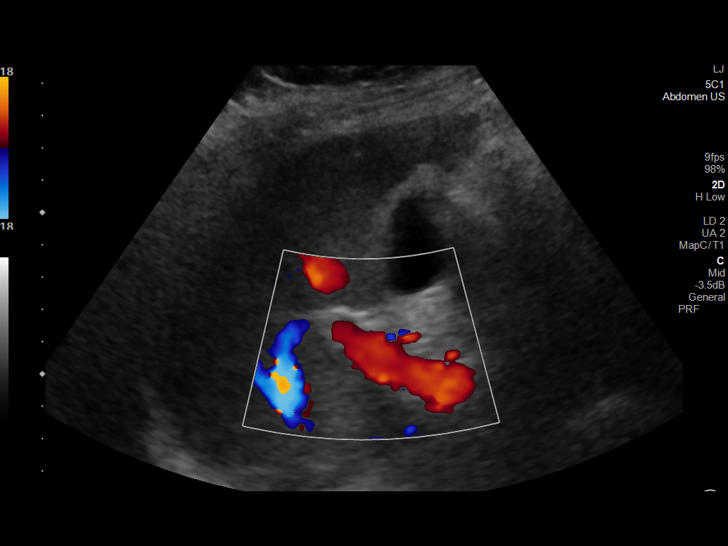

[14 of 25 positions shown; findings below may reference images not displayed]

FINDINGS: Gallbladder:

Within the gallbladder, there is a 3 mm echogenic focus which does
not show significant shadowing but is felt to move slightly. There
is an inherent 2 mm echogenic focus which shadows in the
gallbladder. There is felt to be cholelithiasis. Cholelithiasis is
actually better delineated by CT compared to this study. No
gallbladder wall thickening or pericholecystic fluid. No sonographic
Murphy sign noted by sonographer.

Common bile duct:

Diameter: 3 mm. No intrahepatic or extrahepatic biliary duct
dilatation.

Liver:

No focal lesion identified. Within normal limits in parenchymal
echogenicity. Portal vein is patent on color Doppler imaging with
normal direction of blood flow towards the liver.

Other: None.
IMPRESSION: Small apparent gallstones, better delineated by recent CT. No
gallbladder wall thickening or pericholecystic fluid evident. Study
otherwise unremarkable.

## 2021-01-15 MED ORDER — ONDANSETRON HCL 4 MG/2ML IJ SOLN
4.0000 mg | Freq: Four times a day (QID) | INTRAMUSCULAR | Status: DC | PRN
  Administered 2021-01-20: 4 mg via INTRAVENOUS
  Filled 2021-01-15 (×2): qty 2

## 2021-01-15 MED ORDER — INSULIN ASPART 100 UNIT/ML IJ SOLN
12.0000 [IU] | Freq: Once | INTRAMUSCULAR | Status: AC
  Administered 2021-01-15: 12 [IU] via SUBCUTANEOUS

## 2021-01-15 MED ORDER — HEPARIN SODIUM (PORCINE) 5000 UNIT/ML IJ SOLN
5000.0000 [IU] | Freq: Three times a day (TID) | INTRAMUSCULAR | Status: DC
  Administered 2021-01-15 – 2021-01-16 (×3): 5000 [IU] via SUBCUTANEOUS
  Filled 2021-01-15 (×3): qty 1

## 2021-01-15 MED ORDER — ONDANSETRON HCL 4 MG PO TABS: 4.0000 mg | ORAL_TABLET | Freq: Four times a day (QID) | ORAL | Status: DC | PRN

## 2021-01-15 MED ORDER — INSULIN ASPART 100 UNIT/ML IJ SOLN: 10.0000 [IU] | Freq: Once | INTRAMUSCULAR | Status: DC

## 2021-01-15 MED ORDER — VANCOMYCIN HCL 1250 MG/250ML IV SOLN
1250.0000 mg | Freq: Once | INTRAVENOUS | Status: AC
  Administered 2021-01-15: 1250 mg via INTRAVENOUS
  Filled 2021-01-15: qty 250

## 2021-01-15 MED ORDER — SODIUM CHLORIDE 0.9 % IV BOLUS
500.0000 mL | Freq: Once | INTRAVENOUS | Status: AC
  Administered 2021-01-15: 500 mL via INTRAVENOUS

## 2021-01-15 MED ORDER — LACTATED RINGERS IV BOLUS
1000.0000 mL | Freq: Once | INTRAVENOUS | Status: AC
  Administered 2021-01-15: 1000 mL via INTRAVENOUS

## 2021-01-15 MED ORDER — ONDANSETRON HCL 4 MG/2ML IJ SOLN
4.0000 mg | Freq: Once | INTRAMUSCULAR | Status: AC
  Administered 2021-01-15: 4 mg via INTRAVENOUS
  Filled 2021-01-15: qty 2

## 2021-01-15 MED ORDER — INSULIN ASPART 100 UNIT/ML IJ SOLN
0.0000 [IU] | INTRAMUSCULAR | Status: DC
  Administered 2021-01-15: 2 [IU] via SUBCUTANEOUS
  Administered 2021-01-15: 3 [IU] via SUBCUTANEOUS
  Administered 2021-01-15 – 2021-01-16 (×2): 2 [IU] via SUBCUTANEOUS
  Administered 2021-01-16: 7 [IU] via SUBCUTANEOUS
  Administered 2021-01-16 (×2): 2 [IU] via SUBCUTANEOUS

## 2021-01-15 MED ORDER — NOREPINEPHRINE 4 MG/250ML-% IV SOLN
INTRAVENOUS | Status: AC
  Administered 2021-01-15: 10 ug/min via INTRAVENOUS
  Filled 2021-01-15: qty 250

## 2021-01-15 MED ORDER — LACTATED RINGERS IV BOLUS
500.0000 mL | Freq: Once | INTRAVENOUS | Status: AC
  Administered 2021-01-15: 500 mL via INTRAVENOUS

## 2021-01-15 MED ORDER — RENA-VITE PO TABS
1.0000 | ORAL_TABLET | Freq: Every day | ORAL | Status: DC
  Administered 2021-01-15 – 2021-01-30 (×15): 1 via ORAL
  Filled 2021-01-15 (×15): qty 1

## 2021-01-15 MED ORDER — SODIUM CHLORIDE 0.9 % IV BOLUS
250.0000 mL | Freq: Once | INTRAVENOUS | Status: AC
  Administered 2021-01-15: 250 mL via INTRAVENOUS

## 2021-01-15 MED ORDER — VANCOMYCIN HCL IN DEXTROSE 750-5 MG/150ML-% IV SOLN
750.0000 mg | INTRAVENOUS | Status: DC
  Filled 2021-01-15: qty 150

## 2021-01-15 MED ORDER — ACETAMINOPHEN 325 MG PO TABS
650.0000 mg | ORAL_TABLET | Freq: Four times a day (QID) | ORAL | Status: DC | PRN
  Administered 2021-01-15 – 2021-01-22 (×6): 650 mg via ORAL
  Filled 2021-01-15 (×6): qty 2

## 2021-01-15 MED ORDER — ACETAMINOPHEN 650 MG RE SUPP: 650.0000 mg | Freq: Four times a day (QID) | RECTAL | Status: DC | PRN

## 2021-01-15 MED ORDER — CHLORHEXIDINE GLUCONATE CLOTH 2 % EX PADS
6.0000 | MEDICATED_PAD | Freq: Every day | CUTANEOUS | Status: DC
  Administered 2021-01-16 – 2021-01-24 (×7): 6 via TOPICAL

## 2021-01-15 MED ORDER — HYDROMORPHONE HCL 1 MG/ML IJ SOLN
0.5000 mg | Freq: Once | INTRAMUSCULAR | Status: AC
Start: 2021-01-15 — End: 2021-01-15
  Administered 2021-01-15: 0.5 mg via INTRAVENOUS
  Filled 2021-01-15: qty 1

## 2021-01-15 MED ORDER — DOXERCALCIFEROL 4 MCG/2ML IV SOLN
4.0000 ug | INTRAVENOUS | Status: DC
  Administered 2021-01-24: 4 ug via INTRAVENOUS

## 2021-01-15 MED ORDER — NOREPINEPHRINE 4 MG/250ML-% IV SOLN
0.0000 ug/min | INTRAVENOUS | Status: DC
  Administered 2021-01-16 (×2): 7 ug/min via INTRAVENOUS
  Administered 2021-01-16: 6 ug/min via INTRAVENOUS
  Filled 2021-01-15 (×3): qty 250

## 2021-01-15 MED ORDER — METOCLOPRAMIDE HCL 5 MG/ML IJ SOLN
5.0000 mg | Freq: Once | INTRAMUSCULAR | Status: AC
  Administered 2021-01-15: 5 mg via INTRAVENOUS
  Filled 2021-01-15: qty 2

## 2021-01-15 MED ORDER — SUCROFERRIC OXYHYDROXIDE 500 MG PO CHEW
1000.0000 mg | CHEWABLE_TABLET | Freq: Three times a day (TID) | ORAL | Status: DC
  Administered 2021-01-15 – 2021-01-30 (×36): 1000 mg via ORAL
  Filled 2021-01-15 (×42): qty 2

## 2021-01-15 MED ORDER — SODIUM CHLORIDE 0.9 % IV SOLN
1.0000 g | INTRAVENOUS | Status: DC
  Administered 2021-01-16: 1 g via INTRAVENOUS
  Filled 2021-01-15 (×2): qty 1

## 2021-01-15 MED ORDER — METRONIDAZOLE 500 MG/100ML IV SOLN
500.0000 mg | Freq: Three times a day (TID) | INTRAVENOUS | Status: DC
  Administered 2021-01-15 – 2021-01-16 (×2): 500 mg via INTRAVENOUS
  Filled 2021-01-15 (×2): qty 100

## 2021-01-15 NOTE — Consult Note (Addendum)
NAME:  Ryan Wells, MRN:  [id number], DOB:  Jul 16, 1956, LOS: 0 ADMISSION DATE:  01/15/2021, CONSULTATION DATE:  01/15/21 REFERRING MD:  ITMS, CHIEF COMPLAINT:  N/V   History of Present Illness:  65 year old man w/ hx of DM, HTN, ESRD on HD presenting with worsening nausea, vomiting x 2 days.  Presented to ER with confusion, fever, and low blood pressures.  Initially responded to fluids, now on pressors.  Broad spectrum abx started by IMTS.  PCCM consulted for ICU transfer.  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 6/6 admitted  Interim History / Subjective:  Consulted  Objective   Blood pressure (!) 113/39, pulse 98, temperature (!) 101.8 F (38.8 C), temperature source Axillary, resp. rate 16, height '5\' 2"'$  (1.575 m), weight 66.7 kg, SpO2 96 %.        Intake/Output Summary (Last 24 hours) at 01/15/2021 2342 Last data filed at 01/15/2021 2200 Gross per 24 hour  Intake 500 ml  Output --  Net 500 ml   Filed Weights   01/15/21 [id number]  Weight: 66.7 kg    Examination: General: no acute distress lying supine in med HENT: MM dry, trachea midline Lungs: clear, no accessory muscle use Cardiovascular: RRR, ext warm Abdomen: soft, no peritoneal signs, +BS Extremities: no edema Neuro: moves all 4 ext to command Skin: LUE fistula with good thrill  Labs/imaging that I havepersonally reviewed  (right click and "Reselect all SmartList Selections" daily)  Sodium 128 BUN 95/ Cr 14 Lactate 2.2 Trop 100 WBC 15 Plts down a bit CT with basilar peripheral almost cavitating nodule question septic emboli  Resolved Hospital Problem list   n/a  Assessment & Plan:  Septic shock in context of GI symptoms, abnormal CT chest- would not be surprised if he is bacteremic. Hypovolemic  hyponatremia Under-dialyzed Dehydration Metabolic encephlopathy DM2 with hyperglycemia  - Another liter LR - Peripheral levophed titrated to MAP 65, if escalating needs we can place CVL - vanc/cefepime, f/u culture data - Check echo, if no TV vegetations will need f/u of his pulmonary nodules - Transfer to ICU for pressor needs - PRN antinausea meds - Low-medium suspicion C diff, check so hospital does not get penalized  Best practice (right click and "Reselect all SmartList Selections" daily)  Diet:  NPO Pain/Anxiety/Delirium protocol (if indicated): No VAP protocol (if indicated): Not indicated DVT prophylaxis: Subcutaneous Heparin GI prophylaxis: N/A Glucose control:  SSI Yes and Basal insulin No Central venous access:  N/A Arterial line:  N/A Foley:  N/A Mobility:  bed rest  PT consulted: N/A Last date of multidisciplinary goals of care discussion [pending] Code Status:  full code Disposition: ICU pending pressor liberation  Labs   CBC: Recent Labs  Lab 01/15/21 0612 01/15/21 1342 01/15/21 2049  WBC 15.3*  --  10.0  NEUTROABS  --   --  9.0*  HGB 11.9* 10.2* 11.5*  HCT 34.9* 30.0* 34.3*  MCV 94.3  --  93.5  PLT 104*  --  93*    Basic Metabolic Panel: Recent Labs  Lab 01/15/21 0612 01/15/21 1342 01/15/21 2049  NA 125* 128* 128*  K 4.1 4.0 4.5  CL 83*  --  89*  CO2 19*  --  21*  GLUCOSE 342*  --  182*  BUN 79*  --  95*  CREATININE 13.72*  --  14.22*  CALCIUM 8.5*  --  7.8*  MG  --   --  2.2  PHOS  --   --  4.5   GFR: Estimated Creatinine Clearance: 4.3 mL/min (A) (by C-G formula based on SCr of 14.22 mg/dL (H)). Recent Labs  Lab 01/15/21 0612 01/15/21 2049  WBC 15.3* 10.0  LATICACIDVEN  --  2.2*    Liver Function Tests: Recent Labs  Lab 01/15/21 0612 01/15/21 2049  AST 42* 55*  ALT 29 27  ALKPHOS 100 92  BILITOT 1.3* 1.3*  PROT 8.2* 6.7  ALBUMIN 3.7 3.1*   Recent Labs  Lab 01/15/21 0612  LIPASE 33   No results for input(s):  AMMONIA in the last 168 hours.  ABG    Component Value Date/Time   HCO3 23.4 01/15/2021 1342   TCO2 25 01/15/2021 1342   ACIDBASEDEF 1.0 01/15/2021 1342   O2SAT 56.0 01/15/2021 1342     Coagulation Profile: No results for input(s): INR, PROTIME in the last 168 hours.  Cardiac Enzymes: No results for input(s): CKTOTAL, CKMB, CKMBINDEX, TROPONINI in the last 168 hours.  HbA1C: Hgb A1c MFr Bld  Date/Time Value Ref Range Status  06/04/2019 05:59 AM 5.5 4.8 - 5.6 % Final    Comment:    (NOTE) Pre diabetes:          5.7%-6.4% Diabetes:              >6.4% Glycemic control for   <7.0% adults with diabetes   04/09/2018 04:33 AM 6.4 (H) 4.8 - 5.6 % Final    Comment:    (NOTE) Pre diabetes:          5.7%-6.4% Diabetes:              >6.4% Glycemic control for   <7.0% adults with diabetes     CBG: Recent Labs  Lab 01/15/21 0607 01/15/21 1350 01/15/21 1610 01/15/21 2004 01/15/21 2100  GLUCAP 327* 165* 194* 198* 202*    Review of Systems:    Positive Symptoms in bold:  Constitutional fevers, chills, weight loss, fatigue, anorexia, malaise  Eyes decreased vision, double vision, eye irritation  Ears, Nose, Mouth, Throat sore throat, trouble swallowing, sinus congestion  Cardiovascular chest pain, paroxysmal nocturnal dyspnea, lower ext edema, palpitations   Respiratory SOB, cough, DOE, hemoptysis, wheezing  Gastrointestinal nausea, vomiting, diarrhea  Genitourinary burning with urination, trouble urinating  Musculoskeletal joint aches, joint swelling, back pain  Integumentary  rashes, skin lesions  Neurological focal weakness, focal numbness, trouble speaking, headaches  Psychiatric depression, anxiety, confusion  Endocrine polyuria, polydipsia, cold intolerance, heat intolerance  Hematologic abnormal bruising, abnormal bleeding, unexplained nose bleeds  Allergic/Immunologic recurrent infections, hives, swollen lymph nodes     Past Medical History:  He,  has a  past medical history of 3rd nerve palsy, partial, 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   Surgical History:   Past Surgical History:  Procedure Laterality Date  . A/V FISTULAGRAM Left 06/04/2019   Procedure: A/V FISTULAGRAM;  Surgeon: Angelia Mould, MD;  Location: [facility];  Service: Cardiovascular;  Laterality: Left;  . A/V FISTULAGRAM Left 10/15/2019   Procedure: A/V FISTULAGRAM;  Surgeon: Angelia Mould, MD;  Location: [facility];  Service: Cardiovascular;  Laterality: Left;  . AV FISTULA  PLACEMENT Left 07/02/2018   Procedure: BRACHIOCEPHALIC ARTERIOVENOUS (AV) FISTULA CREATION LEFT ARM;  Surgeon: Serafina Mitchell, MD;  Location: [facility];  Service: Vascular;  Laterality: Left;  . BASCILIC VEIN TRANSPOSITION Left 07/26/2019   Procedure: Bascilic Vein Transposition;  Surgeon: Angelia Mould, MD;  [facility];  Service: Vascular;  Laterality: Left;  . COLONOSCOPY    . EMBOLIZATION Left 06/04/2019   Procedure: EMBOLIZATION;  Surgeon: Angelia Mould, MD;  Location: [facility];  Service: Cardiovascular;  Laterality: Left;  LT ARM FISTULA/COMPETING BRANCH  . EYE SURGERY Bilateral    cataracts x2  . EYE SURGERY    . INSERTION OF DIALYSIS CATHETER Right 06/08/2019   Procedure: INSERTION OF PALINDROME DIALYSIS CATHETER IN RIGHT INTERNAL JUGULAR;  Surgeon: Angelia Mould, MD;  Location: [facility];  Service: Vascular;  Laterality: Right;  . LIGATION OF ARTERIOVENOUS  FISTULA Left 07/26/2019   Procedure: Ligation Of BrachioCephalic  Fistula;  Surgeon: Angelia Mould, MD;  Location: [facility];  Service: Vascular;  Laterality: Left;  . NECK SURGERY    .  PERIPHERAL VASCULAR BALLOON ANGIOPLASTY Left 06/04/2019   Procedure: PERIPHERAL VASCULAR BALLOON ANGIOPLASTY;  Surgeon: Angelia Mould, MD;  Location: [facility];  Service: Cardiovascular;  Laterality: Left;  ARM FISTULA  . PERIPHERAL VASCULAR BALLOON ANGIOPLASTY Left 10/15/2019   Procedure: PERIPHERAL VASCULAR BALLOON ANGIOPLASTY;  Surgeon: Angelia Mould, MD;  Location: [facility];  Service: Cardiovascular;  Laterality: Left;  arm fistula     Social History:   reports that he has quit smoking. He has never used smokeless tobacco. He reports previous alcohol use. He reports that he does not use drugs.   Family History:  His family history includes Diabetes in his brother, father, and sister; Heart disease in his mother; Kidney disease in his father. There is no history of Colon cancer, Stomach cancer, or Esophageal cancer.   Allergies No Known Allergies   Home Medications  Prior to Admission medications   Medication Sig Start Date End Date Taking? Authorizing Provider  cholecalciferol (VITAMIN D3) 25 MCG (1000 UT) tablet Take 2,000 Units by mouth daily.    [provider]  VELPHORO 500 MG chewable tablet Chew 1,000 mg by mouth 3 (three) times daily. 08/18/19   [provider]     Critical care time: 35 minutes not including any separately billable procedures

## 2021-01-15 NOTE — ED Notes (Signed)
Pt pressure dropped. Manual obtained. 50 palpated. Dr. Marianna Payment notified

## 2021-01-15 NOTE — ED Provider Notes (Addendum)
[place] EMERGENCY DEPARTMENT Provider Note   CSN: [id number] Arrival date & time: 01/15/21  [id number]     History Chief Complaint  Patient presents with  . Emesis  . Abdominal Pain    Ryan Wells is a 65 y.o. male.  Patient with hx ESRD/HD M W F c/o upper abd pain and nvd in past three days. Symptoms acute onset, dull, constant, moderate, non radiating, without specific exacerbating or alleviating factors. No bloody or bilious emesis. No bloody diarrhea. No recent known ill contacts, bad food ingestion or antibiotic use. No recent travel. Had normal HD Friday but did not go today. Hx gastroparesis. No hx pud, gallstones, or pancreatitis. Denies back or flank pain. No chest pain or sob. No cough or uri symptoms. No fever or chills.  The history is provided by the patient. A language interpreter was used.  Emesis Associated symptoms: abdominal pain and diarrhea   Associated symptoms: no cough, no fever, no headaches and no sore throat   Abdominal Pain Associated symptoms: diarrhea, nausea and vomiting   Associated symptoms: no chest pain, no cough, no dysuria, no fever, no shortness of breath and no sore throat        Past Medical History:  Diagnosis Date  . 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 AAA (abdominal aortic aneurysm) ([place])    Not noted on CT abd 2019  . Chest pain 11/2013   Normal Echo/ EKG/enzymes-hospitalized.  Did not get outpatient stress testing following hospitalization.  No chest pain since  . Chronic kidney disease    on dialysis Tues, Thurs and Sat  . Diabetes mellitus without complication ([place])   . Diabetes type 2, uncontrolled ([place]) 11/25/2011   no meds, diet controlled per patient  . Diabetic gastroparesis ([place]) 01/22/2017  . Hypertension    no meds  . Microalbuminuria 01/19/2017  . Myocardial infarction [facility]) 2015    Patient Active Problem List   Diagnosis Date Noted  . ESRD on dialysis ([place]) 07/21/2019  . Volume  overload 06/03/2019  . Metabolic acidosis   . Hyperkalemia   . CKD (chronic kidney disease), stage IV ([place]) 04/09/2018  . Renal failure 04/08/2018  . Normocytic anemia 04/08/2018  . Hypertension 04/08/2018  . Diabetes mellitus type II, non insulin dependent ([place])   . Diabetic gastroparesis ([place]) 01/22/2017  . Microalbuminuria 01/19/2017  . 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 Long term use of drug 07/29/2016  . Joint pain 07/12/2016  . Hypothyroidism 07/12/2014  . Diabetes type 2, uncontrolled ([place]) 11/25/2011    Past Surgical History:  Procedure Laterality Date  . A/V FISTULAGRAM Left 06/04/2019   Procedure: A/V FISTULAGRAM;  Surgeon: Angelia Mould, MD;  Location: [place] [facility];  Service: Cardiovascular;  Laterality: Left;  . A/V FISTULAGRAM Left 10/15/2019   Procedure: A/V FISTULAGRAM;  Surgeon: Angelia Mould, MD;  Location: [place] [facility];  Service: Cardiovascular;  Laterality: Left;  . AV FISTULA PLACEMENT Left 07/02/2018   Procedure: BRACHIOCEPHALIC ARTERIOVENOUS (AV) FISTULA CREATION LEFT ARM;  Surgeon: Serafina Mitchell, MD;  Location: [place];  Service: Vascular;  Laterality: Left;  . BASCILIC VEIN TRANSPOSITION Left 07/26/2019   Procedure: Bascilic Vein Transposition;  Surgeon: Angelia Mould, MD;  Location: [facility] OR;  Service: Vascular;  Laterality: Left;  . COLONOSCOPY    . EMBOLIZATION Left 06/04/2019   Procedure: EMBOLIZATION;  Surgeon: Angelia Mould, MD;  Location: [place] [facility];  Service: Cardiovascular;  Laterality: Left;  LT ARM FISTULA/COMPETING BRANCH  . EYE SURGERY Bilateral    cataracts x2  . EYE SURGERY    . INSERTION OF DIALYSIS CATHETER Right 06/08/2019   Procedure: INSERTION OF PALINDROME DIALYSIS CATHETER IN RIGHT INTERNAL JUGULAR;  Surgeon: Angelia Mould, MD;  Location: [place];  Service: Vascular;  Laterality: Right;  . LIGATION OF ARTERIOVENOUS  FISTULA Left 07/26/2019   Procedure: Ligation Of  BrachioCephalic  Fistula;  Surgeon: Angelia Mould, MD;  Location[facility];  Service: Vascular;  Laterality: Left;  . NECK SURGERY    . PERIPHERAL VASCULAR BALLOON ANGIOPLASTY Left 06/04/2019   Procedure: PERIPHERAL VASCULAR BALLOON ANGIOPLASTY;  Surgeon: Angelia Mould, MD;  Location: [place] [facility];  Service: Cardiovascular;  Laterality: Left;  ARM FISTULA  . PERIPHERAL VASCULAR BALLOON ANGIOPLASTY Left 10/15/2019   Procedure: PERIPHERAL VASCULAR BALLOON ANGIOPLASTY;  Surgeon: Angelia Mould, MD;  Location: [place] [facility];  Service: Cardiovascular;  Laterality: Left;  arm fistula       Family History  Problem Relation Age of Onset  . Heart disease Mother        cause of death--CHF?  . Diabetes Father   . Kidney disease Father        Dialysis for kidney failure  . Diabetes Sister   . Diabetes Brother   . Colon cancer Neg Hx   . Stomach cancer Neg Hx   . Esophageal cancer Neg Hx     Social History   Tobacco Use  . Smoking status: Former Research scientist (life sciences)  . Smokeless tobacco: Never Used  Vaping Use  . Vaping Use: Never used  Substance Use Topics  . Alcohol use: Not Currently  . Drug use: Never    Home Medications Prior to Admission medications   Medication Sig Start Date End Date Taking? Authorizing Provider  cholecalciferol (VITAMIN D3) 25 MCG (1000 UT) tablet Take 2,000 Units by mouth daily.    [provider]  VELPHORO 500 MG chewable tablet Chew 1,000 mg by mouth 3 (three) times daily. 08/18/19   [provider]    Allergies    Patient has no known allergies.  Review of Systems   Review of Systems  Constitutional: Negative for fever.  HENT: Negative for sore throat.   Eyes: Negative for redness.  Respiratory: Negative for cough and shortness of breath.   Cardiovascular: Negative for chest pain.  Gastrointestinal: Positive for abdominal pain, diarrhea, nausea and vomiting.  Genitourinary: Negative for dysuria and flank pain.   Musculoskeletal: Negative for back pain and neck pain.  Skin: Negative for rash.  Neurological: Negative for headaches.  Hematological: Does not bruise/bleed easily.  Psychiatric/Behavioral: Negative for confusion.    Physical Exam Updated Vital Signs BP 129/89 (BP Location: Right Arm)   Pulse (!) 108   Temp 99.5 F (37.5 C) (Oral)   Resp 20   Ht 1.575 m ('5\' 2"'$ )   Wt 66.7 kg   SpO2 99%   BMI 26.90 kg/m   Physical Exam Vitals and nursing note reviewed.  Constitutional:      Appearance: Normal appearance. He is well-developed.  HENT:     Head: Atraumatic.     Nose: Nose normal.     Mouth/Throat:     Mouth: Mucous membranes are moist.     Pharynx: Oropharynx is clear.  Eyes:     General: No scleral icterus.    Conjunctiva/sclera: Conjunctivae normal.  Neck:     Trachea: No tracheal deviation.  Cardiovascular:     Rate and Rhythm: Regular rhythm. Tachycardia present.     Pulses: Normal pulses.     Heart sounds: Normal heart sounds. No murmur heard. No friction rub. No gallop.   Pulmonary:     Effort: Pulmonary effort is normal. No accessory muscle usage or respiratory distress.     Breath sounds: Normal breath sounds.  Abdominal:     General: Bowel sounds are normal. There is no distension.     Palpations: Abdomen is soft. There is no mass.     Tenderness: There is abdominal tenderness. There is no guarding or rebound.     Hernia: No hernia is present.     Comments: Upper abd tenderness.   Genitourinary:    Comments: No cva tenderness. Musculoskeletal:        General: No swelling or tenderness.     Cervical back: Normal range of motion and neck supple. No rigidity.     Comments: Left upper arm dialysis fistula w palp thrill.   Skin:    General: Skin is warm and dry.     Findings: No rash.  Neurological:     Mental Status: He is alert.     Comments: Alert, speech clear.   Psychiatric:        Mood and Affect: Mood normal.     ED Results / Procedures /  Treatments   Labs (all labs ordered are listed, but only abnormal results are displayed) Results for orders placed or performed during the hospital encounter of 01/15/21  Lipase, blood  Result Value Ref Range   Lipase 33 11 - 51 U/L  Comprehensive metabolic panel  Result Value Ref Range   Sodium 125 (L) 135 - 145 mmol/L   Potassium 4.1 3.5 - 5.1 mmol/L   Chloride 83 (L) 98 - 111 mmol/L   CO2 19 (L) 22 - 32 mmol/L   Glucose, Bld 342 (H) 70 - 99 mg/dL   BUN 79 (H) 8 - 23 mg/dL   Creatinine, Ser 13.72 (H) 0.61 - 1.24 mg/dL   Calcium 8.5 (L) 8.9 - 10.3 mg/dL   Total Protein 8.2 (H) 6.5 - 8.1 g/dL   Albumin 3.7 3.5 - 5.0 g/dL   AST 42 (H) 15 - 41 U/L   ALT 29 0 - 44 U/L   Alkaline Phosphatase 100 38 - 126 U/L   Total Bilirubin 1.3 (H) 0.3 - 1.2 mg/dL   GFR, Estimated 4 (L) >60 mL/min   Anion gap 23 (H) 5 - 15  CBC  Result Value Ref Range   WBC 15.3 (H) 4.0 - 10.5 K/uL   RBC 3.70 (L) 4.22 - 5.81 MIL/uL   Hemoglobin 11.9 (L) 13.0 - 17.0 g/dL   HCT 34.9 (L) 39.0 - 52.0 %   MCV 94.3 80.0 - 100.0 fL   MCH 32.2 26.0 - 34.0 pg   MCHC 34.1 30.0 - 36.0 g/dL   RDW 13.3 11.5 - 15.5 %   Platelets 104 (L) 150 - 400 K/uL   nRBC 0.0 0.0 - 0.2 %  CBG monitoring, ED  Result Value Ref Range   Glucose-Capillary 327 (H) 70 - 99 mg/dL    EKG EKG Interpretation  Date/Time:  Monday January 15 2021 07:43:35 EDT Ventricular Rate:  89 PR Interval:  165 QRS Duration: 112 QT Interval:  390 QTC Calculation: 475 R Axis:   81 Text Interpretation: Sinus rhythm Ventricular premature complex Nonspecific ST abnormality `no acute st changes compared to prior ecg 06/2018  Confirmed by Lajean Saver [phone]) on 01/15/2021 8:03:28 AM   Radiology CT Abdomen Pelvis Wo Contrast  Result Date: 01/15/2021 CLINICAL DATA:  Acute abdominal pain, nonlocalized abdominal pain in a 65 year old male. EXAM: CT ABDOMEN AND PELVIS WITHOUT CONTRAST TECHNIQUE: Multidetector CT imaging of the abdomen and pelvis was performed  following the standard protocol without IV contrast. COMPARISON:  April 08, 2018. FINDINGS: Lower chest: Bilateral nodular opacities somewhat ill-defined. Area with ground-glass features and bandlike appearance on image 15 of series 4 measuring 11 x 7 mm. Slightly denser more nodular area just above the LEFT hemidiaphragm on image 16 of series 4 measuring 11 x 6 mm. 10 x 9 mm basilar opacity with ground-glass features in the RIGHT lower lobe (image 10/4) no effusion. No consolidative changes. 8 x 4 mm pleural-based area, ground-glass and solid component w. Hepatobiliary: No focal lesion on noncontrast imaging. Cholelithiasis. Gallbladder collapse without pericholecystic stranding. Pancreas: Pancreas without contour abnormality or signs of inflammation. Spleen: Spleen normal size and contour. Adrenals/Urinary Tract: Adrenal glands are normal. Scarring of the bilateral kidneys with signs of parenchymal loss since previous imaging in this patient with end-stage renal disease. No nephrolithiasis. No ureteral calculi. Urinary bladder is normal. Stomach/Bowel: The appendix is normal. No sign of small bowel obstruction. No stranding adjacent to stomach or colon. Vascular/Lymphatic: Calcified atheromatous plaque in the abdominal aorta. No aneurysmal dilation. Smooth contour of the IVC. There is no gastrohepatic or hepatoduodenal ligament lymphadenopathy. No retroperitoneal or mesenteric lymphadenopathy. No pelvic sidewall lymphadenopathy. Reproductive: Prostate unremarkable by CT. Other: No ascites. Musculoskeletal: No acute bone finding. No destructive bone process. Spinal degenerative changes. IMPRESSION: 1. No acute findings in the abdomen or pelvis. 2. Bilateral nodular opacities with ground-glass features in the lung bases. These findings are nonspecific and could potentially represent infectious or inflammatory changes. Correlate with any history of infection or respiratory symptoms including COVID-19 pneumonia.  Suggest 8-12 week follow-up to exclude the possibility of neoplasm. 3. Cholelithiasis without evidence of acute cholecystitis. 4. Scarring of the bilateral kidneys with signs of parenchymal loss since previous imaging in this patient with end-stage renal disease. 5. Aortic atherosclerosis. Aortic Atherosclerosis (ICD10-I70.0). Electronically Signed   By: Zetta Bills M.D.   On: 01/15/2021 08:26   US Abdomen Limited  Result Date: 01/15/2021 CLINICAL DATA:  Right upper quadrant pain EXAM: ULTRASOUND ABDOMEN LIMITED RIGHT UPPER QUADRANT COMPARISON:  CT abdomen and pelvis January 15, 2021 FINDINGS: Gallbladder: Within the gallbladder, there is a 3 mm echogenic focus which does not show significant shadowing but is felt to move slightly. There is an inherent 2 mm echogenic focus which shadows in the gallbladder. There is felt to be cholelithiasis. Cholelithiasis is actually better delineated by CT compared to this study. No gallbladder wall thickening or pericholecystic fluid. No sonographic Murphy sign noted by sonographer. Common bile duct: Diameter: 3 mm. No intrahepatic or extrahepatic biliary duct dilatation. Liver: No focal lesion identified. Within normal limits in parenchymal echogenicity. Portal vein is patent on color Doppler imaging with normal direction of blood flow towards the liver. Other: None. IMPRESSION: Small apparent gallstones, better delineated by recent CT. No gallbladder wall thickening or pericholecystic fluid evident. Study otherwise unremarkable. Electronically Signed   By: Lowella Grip III M.D.   On: 01/15/2021 11:19    Procedures Procedures   Medications Ordered in ED Medications  sodium chloride 0.9 % bolus 500 mL (has no administration in time range)  metoCLOPramide (REGLAN) injection 5 mg (has no administration in time range)  HYDROmorphone (  DILAUDID) injection 0.5 mg (has no administration in time range)    ED Course  I have reviewed the triage vital signs and the  nursing notes.  Pertinent labs & imaging results that were available during my care of the patient were reviewed by me and considered in my medical decision making (see chart for details).    MDM Rules/Calculators/A&P                         Iv ns bolus. reglan iv. Dilaudid iv. Labs sent. Imaging ordered.   Reviewed nursing notes and prior charts for additional history.   Labs reviewed/interpreted by me - wbc elevated. Glucose elevated. Novolog.   CT reviewed/interpreted by me - 'no acute', gallstones. ?infiltrates bases. covid test added. U/s abd r/o cholecystitis.   Recheck, pain improved. U/s pending.   U/s neg for cholecystitis.   Persistent nausea.   Given volume depletion, nvd, hx gastroparesis, persistent symptoms - will admit.   Unassigned medicine consulted for admission - discussed w IM ROC - will see/admit.   Pt missed hd today - will consult renal. Discussed w nephrology - he will consult, and facilitate hd while patient in hospital.        Final Clinical Impression(s) / ED Diagnoses Final diagnoses:  None    Rx / DC Orders ED Discharge Orders    None           Lajean Saver, MD 01/15/21 1232

## 2021-01-15 NOTE — ED Notes (Signed)
Critical lactic 2.2 primary RN made aware

## 2021-01-15 NOTE — Progress Notes (Signed)
Ryan Wells is a 64 Y/O male with ESRD on hemodialysis MWF at [facility]. PMH: DMT2, HTN, sHPT, Anemia of chronic kidney disease. Last HD 01/12/2021. He is compliant with HD Rx, does not miss treatments. He presented to ED with C/O 3 days of abdominal pain, nausea, vomiting, diarrhea. Na noted to be 125 but BS 342-corrected sodium is 131. CO2 19. Was given NS in ED. He does not speak [place], son is assisting with translation. He says he feels better, appears lethargic but rec'd dilaudid earlier today.  He has been admitted as observation patient. We will manage HD and consult formally if he is upgraded to inpatient status. No acute HD needs at present. Will try to schedule HD for later this PM. HGB is WNL He is being worked up for transplant at [facility]. Avoid transfusions if possible.   HD Orders: [place] M[place] 3:45 hrs 180NRe 400/500 68 kg 2.0K2.0 Ca UFP 4 AVF -Heparin 2200 units IV TIW -Hectorol 4 mcg IV TIW  [facility] [phone]

## 2021-01-15 NOTE — Sepsis Progress Note (Signed)
Following for sepsis monitoring ?

## 2021-01-15 NOTE — Progress Notes (Signed)
Pharmacy Antibiotic Note  Ryan Wells is a 65 y.o. male admitted on 01/15/2021 with sepsis.  Pharmacy has been consulted for Cefepime and vancomycin dosing.  WBC wnl, LA 2.2. Of note, patient has a h/o ESRD on MWF HD. Last HD was on 6/3. Planning HD tomorrow   Plan: -Vancomycin 1250 mg IV load followed by vancomycin 750 mg IV Q HD.  -Cefepime 1 gm IV Q 24 hours  -Monitor CBC, renal fx, cultures and clinica progress -Vanc levels as indicated   Height: '5\' 2"'$  (157.5 cm) Weight: 66.7 kg (147 lb 0.8 oz) IBW/kg (Calculated) : 54.6  Temp (24hrs), Avg:100 F (37.8 C), Min:97.9 F (36.6 C), Max:104.1 F (40.1 C)  Recent Labs  Lab 01/15/21 0612  WBC 15.3*  CREATININE 13.72*    Estimated Creatinine Clearance: 4.4 mL/min (A) (by C-G formula based on SCr of 13.72 mg/dL (H)).    No Known Allergies  Antimicrobials this admission: Cefepime 6/6 >>  Vancomycin 6/6 >>  Metronidazole 6/6 >>   Dose adjustments this admission:   Microbiology results: 6/6 BCx:   Thank you for allowing pharmacy to be a part of this patient's care.  Albertina Parr, PharmD., BCPS, BCCCP Clinical Pharmacist Please refer to [facility] for unit-specific pharmacist

## 2021-01-15 NOTE — Progress Notes (Signed)
Patient dialysis treatment rescheduled for Tuesday, June 7 per Dr. Carolin Sicks.  Primary RN, Thurmond Butts made aware.

## 2021-01-15 NOTE — ED Notes (Signed)
Pt unable to give urine sample at this time. Pt does not urinate a lot at baseline.

## 2021-01-15 NOTE — Progress Notes (Signed)
Inpatient Diabetes Program Recommendations  AACE/ADA: New Consensus Statement on Inpatient Glycemic Control (2015)  Target Ranges:  Prepandial:   less than 140 mg/dL      Peak postprandial:   less than 180 mg/dL (1-2 hours)      Critically ill patients:  140 - 180 mg/dL   Lab Results  Component Value Date   GLUCAP 327 (H) 01/15/2021   HGBA1C 5.5 06/04/2019    Review of Glycemic Control Results for RAYMON, GATEWOOD (MRN [id number]) as of 01/15/2021 11:00  Ref. Range 01/15/2021 06:07  Glucose-Capillary Latest Ref Range: 70 - 99 mg/dL 327 (H)   Diabetes history: Type 2 DM Outpatient Diabetes medications: none Current orders for Inpatient glycemic control: Novolog 12 units x 1  Inpatient Diabetes Program Recommendations:    11/02/20 A1C 7.1%, consider repeating?   With renal function, patient maybe sensitive to insulin. If to remain inpatient, consider Novolog 0-6 units Q4H.   Thanks, Bronson Curb, MSN, RNC-OB Diabetes Coordinator [phone] (8a-5p)\

## 2021-01-15 NOTE — ED Triage Notes (Signed)
Pt c/o N/v/d and abdominal pain X3 days.  Reports he gets dialysis on MWF w/ him getting a full treatment on Friday.  Pt is a diabetic and admits that he has not checked or taken his diabetic medication.

## 2021-01-15 NOTE — ED Notes (Signed)
Dr. Marianna Payment at bedside, verbal order for levo put in. Levo started at 53 with [facility]

## 2021-01-15 NOTE — ED Notes (Signed)
Dr. Marianna Payment notified of pt lactic via secure chat and paged with result

## 2021-01-15 NOTE — ED Notes (Signed)
Critical care at bedside  

## 2021-01-15 NOTE — Progress Notes (Signed)
Patient's nurse direct messaged me regarding new chest pain and VT on telemetry. Patient was in his usually states of health until 3 days ago when he developed nausea, vomiting, and diarrhea. Patient does have a history of ESRD on HD and has been compliant his his hemodialysis. He denies any inciting events, changes in his diet or sick contacts.   On evaluation, the patient was confused and sluggish and only able to answer some questions appropriately. Therefore, I was not able to gather additional details form the interview. Previous labs from this morning showed an elevated leukocytosis to 15.3, thrombocytopenia to 104, bicarb of 19 with an AG of 23, mild elevation in transaminase and bilirubin. CT Abd/Pelvis wo contrast showed chololithiasis without acute cholecystitis. There was some bilateral nodular opacities and ground-glass opacities in the lung bases.   Today's Vitals   01/15/21 1730 01/15/21 1800 01/15/21 2015 01/15/21 2100  BP: 137/81 139/60  (!) 119/48  Pulse: 95 95  80  Resp: (!) 24 (!) 27  (!) 30  Temp:  97.9 F (36.6 C) (!) 104.1 F (40.1 C)   TempSrc:  Oral Rectal   SpO2: 97% 100%  (!) 89%  Weight:      Height:      PainSc:       Body mass index is 26.9 kg/m.  On physical exam, he was tachycardic, febrile and diaphoretic. He complained of diffuse abdominal pain particularly when defecating. His abdomen was mildly distended but soft and he denied TTP of his abdomen. He also had left elbow erythema and effusion that was TTP. His AVF is on his left arm and was without erythema, edema, rash or exudates.  He had some nonspecific mouth discomfort with poor dentition and superficial cervical/parotid LAD. While in the room the patient had two episodes of large volume diarrhea that was dark green in color.   Telemetry was reviews, showing self terminating polymorphic vtach. Repeat EKG was done showing sinus tachy with PVC and more pronounced Q wave in leads II, III, aVF, and new Q  waves in  V4, V5, and V6 compared to his previous EKG in 2020.   Assessment:   Patient's clinical status indicates sepsis of unknown source. Possibly from oral infection vs GI source. Cannot rule out multifocal pneumonia based on the limited view of his lungs on CT.   Patient's chest pain has resolved but he does have some concerning changes on his EKG. He had an echo in 2021 showing grade I diastolic dysfunction, mild right and left atrial dilation. He had a stress test that did not show any ischemia.     Plan: - Code sepsis - 2x blood cultures - will give 500 cc bolus of fluid  - Will start broad spectrum AB with Vanc, cefepime and flagyl  - Repeat CBC with dif, CMP, Mg, Phos, lactic acid - EKG and troponin  - XR of left elbow - UA/culture pending (unsure if he still makes urine) - Chest ER pending   Lawerance Cruel, D.O.  Internal Medicine Resident, PGY-2 [facility]  Pager: [phone] 9:52 PM, 01/15/2021

## 2021-01-15 NOTE — ED Notes (Signed)
Levo titrated down to 10

## 2021-01-15 NOTE — ED Notes (Addendum)
Dr. Marianna Payment at bedside. Normal saline bolus started pressure bag

## 2021-01-15 NOTE — Progress Notes (Signed)
Paged to bedside at 10:50pm for hypotension. On evaluation the patient was awake but confused and able to answer some question appropriately. He had a MAP of 40 and tachycardic. He was given a 500 cc bolus of fluids with minimal response. Patient was given 1L bolus of NS and started on levophed with improvement of his MAP to 60. PCCM was consulted and agree with transfer to ICU for the night for pressors and close monitoring.   Patient is on IV antibiotic, fluids and pressors for septic shock. His CXR resulted showing bilateral lower lobe pathy opacities concerning for multifocal pneumonia. Considering the patient presented with diarrhea, hyponatremia, and CXR findings consistent with pneumonia, atypical infection such as legionella cannot be excluded. Alternative, he could have GI source of infection.    Today's Vitals   01/15/21 2248 01/15/21 2250 01/15/21 2252 01/15/21 2300  BP: (!) 57/24 (!) 63/26  (!) 119/43  Pulse: 83 83 83 89  Resp: (!) '23 20 16 18  '$ Temp:      TempSrc:      SpO2: 94% 94% 94% 98%  Weight:      Height:      PainSc:       Body mass index is 26.9 kg/m.  Plan:  - Continue broad spectrum antibiotics - Continue 1 L fluid bolus - Continue levophed with goal MAP of >60 - Order legonella Ag, GI pathogen PCR panel, and C dif.  - Transfer to the ICU for further evaluation and management.   Lawerance Cruel, D.O.  Internal Medicine Resident, PGY-2 [facility]  Pager: [phone] 11:27 PM, 01/15/2021

## 2021-01-15 NOTE — H&P (Signed)
Date: 01/15/2021               Patient Name:  Ryan Wells MRN: [id number]  DOB: 1956/07/08 Age / Sex: 65 y.o., male   PCP: Mack Hook, MD         Medical Service: Internal Medicine Teaching Service         Attending Physician: Dr. Lajean Saver, MD    First Contact: Dr. Petra Kuba Pager: [phone]  Second Contact: Dr. Charleen Kirks Pager: [phone]       After Hours (After 5p/  First Contact Pager: [phone]  weekends / holidays): Second Contact Pager: [phone]   Chief Complaint: Nausea, vomiting  History of Present Illness:  [name] is a 65 yo M w/ PMH of ESRD MWF, Diet-controlled T2Dm, Anemia of chronic dz, HTN presenting to [facility] with nausea and vomiting. He is AAOx3 but noted to be somnolent. He has difficulty with providiving further history. He mentions primary complaint of nausea, vomiting diarrhea ongoing for the last 3 days. He denies any fevers, chills, abdominal pain, hematemesis, melena, BRBPR. He denies prior episode of similar symptoms. He denies any obvious inciting events. He mentions having his last meal 2 days ago and has been unable to tolerate oral intake since.  Additional history was obtained from family member, Jetta Lout [phone]). He mentions that Mr.Gonzalez was in his usual state of health until 3 days prior when he began to have worsening nausea, vomiting, and diarrhea. Family denies any new medications changes or sick contact. They mention that he has been eating a lot of watermelon recently due to the hot weather but otherwise denies any dietary changes. No one else at the home is currently sick. He was hoping his symptoms would pass with time, but this morning, he woke up feeling too weak to go to his regularly scheduled dialysis so he was brought to [facility] for evaluation.  Chart review shows he was recently seen at [facility] for pre-transplant evaluation. He was deemed to be appropriate and was scheduled for follow up visit to discuss  further. He has not started any new medications recently.  Meds:  Amlodipine '5mg'$  daily B complex multivitamin 0.'8mg'$   Calcitriol 0.54mg Cholecalciferol vitamin D3 2000 units Furosemide '20mg'$  daily Velphoro '1000mg'$  TID  Allergies: Allergies as of 01/15/2021  . (No Known Allergies)   Past Medical History:  Diagnosis Date  . 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 AAA (abdominal aortic aneurysm) ([place]    Not noted on CT abd 2019  . Chest pain 11/2013   Normal Echo/ EKG/enzymes-hospitalized.  Did not get outpatient stress testing following hospitalization.  No chest pain since  . Chronic kidney disease    on dialysis Tues, Thurs and Sat  . Diabetes mellitus without complication ([facility]   . Diabetes type 2, uncontrolled ([place] 11/25/2011   no meds, diet controlled per patient  . Diabetic gastroparesis ([facility] 01/22/2017  . Hypertension    no meds  . Microalbuminuria 01/19/2017  . Myocardial infarction ([facility] 2015   Family History: Unable to provide  Social History: Lives [place] 3 sons[place] any history of tobacco, alcohol, illicit substance use.  Review of Systems: A complete ROS was negative except as per HPI.  Physical Exam: Blood pressure (!) 128/59, pulse 88, temperature 99.5 F (37.5 C), temperature source Oral, resp. rate 18, height '5\' 2"'$  (1.575 m), weight 66.7 kg, SpO2 92 %.  Gen: Well-developed, ill-appearing HEENT: NCAT head, hearing intact,  EOMI, Dry mucous membranes Neck: supple, ROM intact [id number] rate and rhythm, 3/6 systolic murmur,  Pulm: CTAB, No rales, no wheezes, no dullness to percussion  Abd: Soft, hyperactive bowel sounds, NTND, No rebound, no guarding Extm: ROM intact, Peripheral pulses intact, No peripheral edema Skin: Dry, Warm, poor turgor Neuro: AAOx3  EKG: personally reviewed my interpretation is normal sinus, + PVC  CXR: N/A  Assessment & Plan by Problem: Active Problems:   * No active hospital problems.  *  Mr.Gonzalez is a 65 yo M w/ PMH of ESRD, Diet-controlled diabetes, HTN, Anemia of chronic dz presenting [facility] with [facility], vomiting.  Nausea, vomiting, diarrhea 2/2 likely viral gastroenteritis Presenting with nausea, vomiting diarrhea of 3 day duration. Acute onset likely suggest viral etiology. Elevated wbc at 15.3 but no diff. CT abd/pelvis w/ cholelithiasis w/o cholecystitis. Hyperglycemic at 342 but venous pH 7.39. Other differential includes biliary colic, uremia, symptomatic hyperglycemia. Will treat supportively. - Zofran for nausea - No further IV fluids in settting of hyponatremia, ESRD - Full liquid diet and advance as tolerated  Hypovolemic hyponatremia Na 125. Likely due to solute loss from nausea, vomiting. K wnl due to ESRD. Serum osm pending. Received NS in ED. Makes minimal urine - F/u serum osm - Unable to get urine labs - Monitor  ERSD MWF Last dialysis session 01/12/21. K 4.1 Currently undergoing transplant eval at [facility] nephrology team assistance with dialysis - Trend electrolytes - Avoid nephrotoxic meds when able  Incidental Pulmonary opacities Incidental findings of bilateral nodular opacities with ground-glass opacities. Denies any current pulmonary symptoms.  - F/u 8-12 week f/u for resolution  Diet-controlled DM Last hgb a1c 7.0. Not on any diabetic meds at home. Initially blood glucose at 342, but dropped to 165 with 12 units novolog - F/u hgb a1c - SSI - Glucose checks  Anemia of Chronic Disease Hemoglobin 11.9 Appear to be at baseline - Trend cbc  DVT prophx: subqhep Diet: full liquid Bowel: N/A Code: Full  Prior to Admission Living Arrangement: Home Anticipated Discharge Location: Home Barriers to Discharge: Medical treatment  Dispo: Admit patient to Observation with expected length of stay less than 2 midnights.  Signed: Mosetta Anis, MD 01/15/2021, 2:13 PM Pager: [phone] After 5pm on weekdays and 1pm on  weekends: On Call Pager: [phone]

## 2021-01-16 ENCOUNTER — Inpatient Hospital Stay (HOSPITAL_COMMUNITY): Payer: Medicare HMO

## 2021-01-16 DIAGNOSIS — R6521 Severe sepsis with septic shock: Secondary | ICD-10-CM

## 2021-01-16 DIAGNOSIS — B9561 Methicillin susceptible Staphylococcus aureus infection as the cause of diseases classified elsewhere: Secondary | ICD-10-CM

## 2021-01-16 DIAGNOSIS — R112 Nausea with vomiting, unspecified: Secondary | ICD-10-CM

## 2021-01-16 DIAGNOSIS — A419 Sepsis, unspecified organism: Secondary | ICD-10-CM | POA: Diagnosis present

## 2021-01-16 DIAGNOSIS — N186 End stage renal disease: Secondary | ICD-10-CM

## 2021-01-16 DIAGNOSIS — K8020 Calculus of gallbladder without cholecystitis without obstruction: Secondary | ICD-10-CM

## 2021-01-16 DIAGNOSIS — M549 Dorsalgia, unspecified: Secondary | ICD-10-CM

## 2021-01-16 DIAGNOSIS — I38 Endocarditis, valve unspecified: Secondary | ICD-10-CM

## 2021-01-16 DIAGNOSIS — R7881 Bacteremia: Secondary | ICD-10-CM

## 2021-01-16 DIAGNOSIS — Z992 Dependence on renal dialysis: Secondary | ICD-10-CM

## 2021-01-16 LAB — CBC WITH DIFFERENTIAL/PLATELET
Abs Immature Granulocytes: 0.4 10*3/uL — ABNORMAL HIGH (ref 0.00–0.07)
Basophils Absolute: 0.1 10*3/uL (ref 0.0–0.1)
Basophils Relative: 0 %
Eosinophils Absolute: 0 10*3/uL (ref 0.0–0.5)
Eosinophils Relative: 0 %
HCT: 40.5 % (ref 39.0–52.0)
Hemoglobin: 11.7 g/dL — ABNORMAL LOW (ref 13.0–17.0)
Immature Granulocytes: 1 %
Lymphocytes Relative: 5 %
Lymphs Abs: 1.3 10*3/uL (ref 0.7–4.0)
MCH: 26.4 pg (ref 26.0–34.0)
MCHC: 28.9 g/dL — ABNORMAL LOW (ref 30.0–36.0)
MCV: 91.4 fL (ref 80.0–100.0)
Monocytes Absolute: 2.4 10*3/uL — ABNORMAL HIGH (ref 0.1–1.0)
Monocytes Relative: 9 %
Neutro Abs: 23.7 10*3/uL — ABNORMAL HIGH (ref 1.7–7.7)
Neutrophils Relative %: 85 %
Platelets: 294 10*3/uL (ref 150–400)
RBC: 4.43 MIL/uL (ref 4.22–5.81)
RDW: 16.8 % — ABNORMAL HIGH (ref 11.5–15.5)
WBC: 27.9 10*3/uL — ABNORMAL HIGH (ref 4.0–10.5)
nRBC: 0 % (ref 0.0–0.2)

## 2021-01-16 LAB — RENAL FUNCTION PANEL
Albumin: 3 g/dL — ABNORMAL LOW (ref 3.5–5.0)
Anion gap: 15 (ref 5–15)
BUN: 47 mg/dL — ABNORMAL HIGH (ref 8–23)
CO2: 19 mmol/L — ABNORMAL LOW (ref 22–32)
Calcium: 8.5 mg/dL — ABNORMAL LOW (ref 8.9–10.3)
Chloride: 99 mmol/L (ref 98–111)
Creatinine, Ser: 3.12 mg/dL — ABNORMAL HIGH (ref 0.61–1.24)
GFR, Estimated: 21 mL/min — ABNORMAL LOW (ref >60)
Glucose, Bld: 153 mg/dL — ABNORMAL HIGH (ref 70–99)
Phosphorus: 8.3 mg/dL — ABNORMAL HIGH (ref 2.5–4.6)
Potassium: 5.3 mmol/L — ABNORMAL HIGH (ref 3.5–5.1)
Sodium: 133 mmol/L — ABNORMAL LOW (ref 135–145)

## 2021-01-16 LAB — BASIC METABOLIC PANEL
Anion gap: 19 — ABNORMAL HIGH (ref 5–15)
Anion gap: 17 — ABNORMAL HIGH (ref 5–15)
BUN: 99 mg/dL — ABNORMAL HIGH (ref 8–23)
BUN: 39 mg/dL — ABNORMAL HIGH (ref 8–23)
CO2: 17 mmol/L — ABNORMAL LOW (ref 22–32)
CO2: 22 mmol/L (ref 22–32)
Calcium: 7.6 mg/dL — ABNORMAL LOW (ref 8.9–10.3)
Calcium: 7.3 mg/dL — ABNORMAL LOW (ref 8.9–10.3)
Chloride: 91 mmol/L — ABNORMAL LOW (ref 98–111)
Chloride: 96 mmol/L — ABNORMAL LOW (ref 98–111)
Creatinine, Ser: 15.08 mg/dL — ABNORMAL HIGH (ref 0.61–1.24)
Creatinine, Ser: 6.75 mg/dL — ABNORMAL HIGH (ref 0.61–1.24)
GFR, Estimated: 3 mL/min — ABNORMAL LOW (ref >60)
GFR, Estimated: 8 mL/min — ABNORMAL LOW (ref >60)
Glucose, Bld: 242 mg/dL — ABNORMAL HIGH (ref 70–99)
Glucose, Bld: 149 mg/dL — ABNORMAL HIGH (ref 70–99)
Potassium: 3.2 mmol/L — ABNORMAL LOW (ref 3.5–5.1)
Potassium: 4.1 mmol/L (ref 3.5–5.1)
Sodium: 127 mmol/L — ABNORMAL LOW (ref 135–145)
Sodium: 135 mmol/L (ref 135–145)

## 2021-01-16 LAB — BLOOD CULTURE ID PANEL (REFLEXED) - BCID2

## 2021-01-16 LAB — CLOSTRIDIUM DIFFICILE BY PCR, REFLEXED: Toxigenic C. Difficile by PCR: POSITIVE — AB

## 2021-01-16 LAB — CBC
HCT: 29.3 % — ABNORMAL LOW (ref 39.0–52.0)
Hemoglobin: 10.4 g/dL — ABNORMAL LOW (ref 13.0–17.0)
MCH: 32.1 pg (ref 26.0–34.0)
MCHC: 35.5 g/dL (ref 30.0–36.0)
MCV: 90.4 fL (ref 80.0–100.0)
Platelets: 76 10*3/uL — ABNORMAL LOW (ref 150–400)
RBC: 3.24 MIL/uL — ABNORMAL LOW (ref 4.22–5.81)
RDW: 13.2 % (ref 11.5–15.5)
WBC: 10.6 10*3/uL — ABNORMAL HIGH (ref 4.0–10.5)
nRBC: 0 % (ref 0.0–0.2)

## 2021-01-16 LAB — MRSA PCR SCREENING: MRSA by PCR: NEGATIVE

## 2021-01-16 LAB — BLOOD GAS, VENOUS
Acid-base deficit: 8.1 mmol/L — ABNORMAL HIGH (ref 0.0–2.0)
Bicarbonate: 17.4 mmol/L — ABNORMAL LOW (ref 20.0–28.0)
FIO2: 21
O2 Saturation: 52.5 %
Patient temperature: 37
pCO2, Ven: 38.3 mmHg — ABNORMAL LOW (ref 44.0–60.0)
pH, Ven: 7.279 (ref 7.250–7.430)
pO2, Ven: 33.1 mmHg (ref 32.0–45.0)

## 2021-01-16 LAB — GLUCOSE, CAPILLARY
Glucose-Capillary: 163 mg/dL — ABNORMAL HIGH (ref 70–99)
Glucose-Capillary: 172 mg/dL — ABNORMAL HIGH (ref 70–99)
Glucose-Capillary: 177 mg/dL — ABNORMAL HIGH (ref 70–99)
Glucose-Capillary: 303 mg/dL — ABNORMAL HIGH (ref 70–99)
Glucose-Capillary: 158 mg/dL — ABNORMAL HIGH (ref 70–99)
Glucose-Capillary: 129 mg/dL — ABNORMAL HIGH (ref 70–99)
Glucose-Capillary: 164 mg/dL — ABNORMAL HIGH (ref 70–99)

## 2021-01-16 LAB — OSMOLALITY: Osmolality: 306 mOsm/kg — ABNORMAL HIGH (ref 275–295)

## 2021-01-16 LAB — LACTIC ACID, PLASMA: Lactic Acid, Venous: 1.7 mmol/L (ref 0.5–1.9)

## 2021-01-16 LAB — C DIFFICILE QUICK SCREEN W PCR REFLEX
C Diff antigen: POSITIVE — AB
C Diff toxin: NEGATIVE

## 2021-01-16 LAB — ECHOCARDIOGRAM COMPLETE
Area-P 1/2: 3.54 cm2
Calc EF: 53.8 %
Height: 62 in
S' Lateral: 3.7 cm
Single Plane A2C EF: 57.6 %
Single Plane A4C EF: 46.2 %
Weight: 2433.88 oz

## 2021-01-16 LAB — TROPONIN I (HIGH SENSITIVITY): Troponin I (High Sensitivity): 98 ng/L — ABNORMAL HIGH (ref <18)

## 2021-01-16 MED ORDER — HEPARIN SODIUM (PORCINE) 1000 UNIT/ML IJ SOLN
INTRAMUSCULAR | Status: AC
  Administered 2021-01-16: 1000 [IU]
  Filled 2021-01-16: qty 3

## 2021-01-16 MED ORDER — HEPARIN SODIUM (PORCINE) 1000 UNIT/ML DIALYSIS: 2200.0000 [IU] | Freq: Once | INTRAMUSCULAR | Status: DC

## 2021-01-16 MED ORDER — PERFLUTREN LIPID MICROSPHERE
1.0000 mL | INTRAVENOUS | Status: AC | PRN
  Administered 2021-01-16: 2 mL via INTRAVENOUS
  Filled 2021-01-16: qty 10

## 2021-01-16 MED ORDER — OXYCODONE HCL 5 MG PO TABS
5.0000 mg | ORAL_TABLET | Freq: Four times a day (QID) | ORAL | Status: DC | PRN
Start: 2021-01-16 — End: 2021-01-19
  Administered 2021-01-16 – 2021-01-19 (×6): 5 mg via ORAL
  Filled 2021-01-16 (×6): qty 1

## 2021-01-16 MED ORDER — SODIUM CHLORIDE 0.9 % IV SOLN: 100.0000 mL | INTRAVENOUS | Status: DC | PRN

## 2021-01-16 MED ORDER — PENTAFLUOROPROP-TETRAFLUOROETH EX AERO: 1.0000 "application " | INHALATION_SPRAY | CUTANEOUS | Status: DC | PRN

## 2021-01-16 MED ORDER — CEFAZOLIN SODIUM-DEXTROSE 1-4 GM/50ML-% IV SOLN
1.0000 g | INTRAVENOUS | Status: AC
  Administered 2021-01-16 – 2021-01-23 (×8): 1 g via INTRAVENOUS
  Filled 2021-01-16 (×10): qty 50

## 2021-01-16 MED ORDER — PANTOPRAZOLE SODIUM 40 MG IV SOLR
40.0000 mg | Freq: Every day | INTRAVENOUS | Status: DC
  Administered 2021-01-16 – 2021-01-20 (×4): 40 mg via INTRAVENOUS
  Filled 2021-01-16 (×6): qty 40

## 2021-01-16 MED ORDER — SODIUM ZIRCONIUM CYCLOSILICATE 10 G PO PACK
10.0000 g | PACK | Freq: Once | ORAL | Status: AC
  Administered 2021-01-16: 10 g via ORAL
  Filled 2021-01-16: qty 1

## 2021-01-16 MED ORDER — INSULIN ASPART 100 UNIT/ML IJ SOLN
3.0000 [IU] | INTRAMUSCULAR | Status: DC
  Administered 2021-01-16: 3 [IU] via SUBCUTANEOUS
  Administered 2021-01-16 – 2021-01-17 (×2): 6 [IU] via SUBCUTANEOUS
  Administered 2021-01-17: 3 [IU] via SUBCUTANEOUS
  Administered 2021-01-17: 6 [IU] via SUBCUTANEOUS
  Administered 2021-01-17: 9 [IU] via SUBCUTANEOUS
  Administered 2021-01-18 (×2): 6 [IU] via SUBCUTANEOUS
  Administered 2021-01-18: 3 [IU] via SUBCUTANEOUS
  Administered 2021-01-18: 9 [IU] via SUBCUTANEOUS
  Administered 2021-01-18: 6 [IU] via SUBCUTANEOUS
  Administered 2021-01-19 (×2): 3 [IU] via SUBCUTANEOUS
  Administered 2021-01-20: 6 [IU] via SUBCUTANEOUS
  Administered 2021-01-20: 3 [IU] via SUBCUTANEOUS
  Administered 2021-01-20: 6 [IU] via SUBCUTANEOUS
  Administered 2021-01-20: 3 [IU] via SUBCUTANEOUS
  Administered 2021-01-20: 9 [IU] via SUBCUTANEOUS
  Administered 2021-01-21 (×2): 6 [IU] via SUBCUTANEOUS
  Administered 2021-01-22: 3 [IU] via SUBCUTANEOUS
  Administered 2021-01-22: 6 [IU] via SUBCUTANEOUS
  Administered 2021-01-22 – 2021-01-23 (×3): 3 [IU] via SUBCUTANEOUS
  Administered 2021-01-23: 6 [IU] via SUBCUTANEOUS
  Administered 2021-01-25 (×3): 3 [IU] via SUBCUTANEOUS
  Administered 2021-01-26: 9 [IU] via SUBCUTANEOUS
  Administered 2021-01-27 (×2): 3 [IU] via SUBCUTANEOUS

## 2021-01-16 MED ORDER — LIDOCAINE-PRILOCAINE 2.5-2.5 % EX CREA
1.0000 "application " | TOPICAL_CREAM | CUTANEOUS | Status: DC | PRN
  Filled 2021-01-16: qty 5

## 2021-01-16 MED ORDER — LIDOCAINE HCL (PF) 1 % IJ SOLN: 5.0000 mL | INTRAMUSCULAR | Status: DC | PRN

## 2021-01-16 MED ORDER — SODIUM CHLORIDE 0.9 % IV SOLN
250.0000 mL | INTRAVENOUS | Status: DC
  Administered 2021-01-16: 250 mL via INTRAVENOUS

## 2021-01-16 MED ORDER — FIDAXOMICIN 200 MG PO TABS
200.0000 mg | ORAL_TABLET | Freq: Two times a day (BID) | ORAL | Status: DC
  Administered 2021-01-16 – 2021-01-23 (×14): 200 mg via ORAL
  Filled 2021-01-16 (×16): qty 1

## 2021-01-16 NOTE — ED Notes (Signed)
Pt found to have runs of vtach on monitor by Doy Mince. Upon assessment she found pt with c/o chest pain, febrile, and with diarrhea. Internal medicine paged. Internal medicine at bedside to assess patient and orders to be placed.

## 2021-01-16 NOTE — Progress Notes (Signed)
NAME:  Ryan Wells, MRN:  [id number], DOB:  13-Aug-1955, LOS: 1 ADMISSION DATE:  01/15/2021, CONSULTATION DATE:  01/15/21 REFERRING MD:  ITMS, CHIEF COMPLAINT:  N/V   History of Present Illness:  65 year old man w/ hx of DM, HTN, ESRD on HD presenting with worsening nausea, vomiting x 2 days.  Presented to ER with confusion, fever, and low blood pressures.  Initially responded to fluids, now on pressors.  Broad spectrum abx started by IMTS.  PCCM consulted for ICU transfer.  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 6/6 admitted with septic shock: started on Levo . 6/7: remains on 6 of Levo; plan for HD today  Interim History / Subjective:   Patient is on 2 l/m [place] BP stable on 6 of Levo Temp trending down; WBC increased to 27 States his Nausea is improved and denies any abdominal pain; moaning from back pain and myalgias BCID positive for MSSA bacteremia: ID following   Objective   Blood pressure (!) 118/44, pulse 100, temperature 99.8 F (37.7 C), temperature source Oral, resp. rate 18, height '5\' 2"'$  (1.575 m), weight 69 kg, SpO2 100 %.        Intake/Output Summary (Last 24 hours) at 01/16/2021 [id number] Last data filed at 01/16/2021 0600 Gross per 24 hour  Intake 1422.28 ml  Output --  Net 1422.28 ml   Filed Weights   01/15/21 0611 01/16/21 0048  Weight: 66.7 kg 69 kg    Examination: General:  NAD sitting up in bed HEENT: MM pink/moist; [place] in place Neuro: AOx3; moves all extremities CV: s1s2, RRR, no m/r/g PULM:  Dim clear bs bilaterally; 2 l/m [place] GI: soft, bsx4 active, no tenderness to palpation Extremities: warm/dry, no edema; LUE fistula with thrill; site without erythema, drainage, tenderness Skin: no rashes or lesions   Labs/imaging that I havepersonally reviewed  (right click and "Reselect all  SmartList Selections" daily)   BC: positive for gram positive cocci in clusters (staph aureus)  VBG: 7.27, paco2 28, hco317.4 Na 133 from 128 K 5.3 from 4.5 BUN 47 and Cr 3.12 (trending down) Trop 98 from 114 WBC 27.9 from 15 Platelets 294 from 93 CXR: bilateral patchy opacities CT abdomen/pelvis: bilateral nodular opacities with groud-glass features in lung bases. Potentially infectious or inflammatory. Cholelithiasis without cholecystitis. Kidney scarring.   Resolved Hospital Problem list   Thrombocytopenia  Assessment & Plan:   MSSA bacteremia: Positive BC on 6/7 for staph aureus. Septic shock: Unclear when RIJ TDC was removed. Current access is LE AV fistula. P:  -continue peripheral NE for MAP > 65, may can consider lower MAP value given ESRD -Will start IV fluids 10-20 mL/hr -ID following, appreciate input: will stop Cefepime, Flagyl, and Vanc and start on Ancef per BICD -Cdiff culture pending -repeat BC in 24hrs -prn tylenol for fever -trend CBC -Echo pending, may need TEE -Will order Chest CT given abnormal CT abdomen showing bilateral pulmonary nodular opacities With MSSA bacteremia -Will order MRI spine given back pain to rule out osteo/ discitis   AKI on ESRD: LUE fistula: dialysis 3 days a week: missed dialysis 6/6 due to being admitted here AGMA Hyperkalemia Mild Anemia: likely chronic with ESRD P: -Nephrology following -Plan for HD today; no net ultrafiltration due to hypotension -Lokelma ordered -Will repeat BMP this afternoon -Daily CBC  and BMP  Cholelithiasis: CT abdomen and abd [place] suggest no cholangitis N/V Diarrhea P: -zofran prn -Cdiff pending -Trend CBC  Hypovolemic hyponatremia (improving) P: -Trend BMP  Metabolic encephlopathy (Improving) P: -improving but will continue to assess  DM2 with hyperglycemia P: -SSI and CBG monitoring  Hx HTN - does not appear to be on home meds/ antihypertensives  Best practice (right click and  "Reselect all SmartList Selections" daily)  Diet:  NPO Pain/Anxiety/Delirium protocol (if indicated): No VAP protocol (if indicated): Not indicated DVT prophylaxis: Subcutaneous Heparin GI prophylaxis: PPI Glucose control:  SSI Yes Central venous access:  N/A  Arterial line:  N/A Foley:  N/A Mobility:  bed rest  PT consulted: N/A Last date of multidisciplinary goals of care discussion [patient and wife updated at bedside 6/7 via tele- interpreter services] Code Status:  full code Disposition: ICU   Critical care time: 35 minutes       JD Rexene Agent [facility] 01/16/2021, 9:39 AM  Please see [email or web address] for pager details.  From 7A-7P if no response, please call [phone]. After hours, please call ELink [phone].

## 2021-01-16 NOTE — ED Notes (Signed)
Interpreter used. Pt reporting no pain at this time stating "everything is marvelous" - Pt is AO to name, place, year. This RN updated pt on plan of care and informed pt that he was being admitted to the hospital. Pt has no further questions at this time

## 2021-01-16 NOTE — Consult Note (Signed)
[facility]    Date of Admission:  01/15/2021   Total days of antibiotics 2               Reason for Consult: Gram-positive cocci bacteremia associated with HD   Referring Provider: Noemi Chapel, DO Primary Care Provider: Mack Hook, MD  Assessment: Patient have history of ESRD on HD presented to the hospital for nausea and vomiting, found to be in septic shock that requires pressors.  Patient was started on broad-spectrum antibiotics with vancomycin, cefepime and Flagyl.  Blood culture grew gram-positive cocci in clusters in both aerobic and anaerobic bottles, BCID pending.   His gram-positive cocci is likely related to hemodialysis.  Patient has an HD catheter which was removed.  Currently receiving dialysis by his left fistula.  Will continue vancomycin and cefepime.  Will follow the ID of gram-positive cocci, which is likely to be staph aureus vs coagulase-negative staph, and adjust antibiotics accordingly.  Will stop Flagyl.  Plan: 1. Continue vancomycin and cefepime 2. Follow blood culture ID 3. Stop Flagyl 4. Pending C. difficile test GI panel  Active Problems:   Intractable nausea and vomiting   Sepsis ([place])   Shock ([place])   Scheduled Meds: . Chlorhexidine Gluconate Cloth  6 each Topical Q0600  . [START ON 01/17/2021] doxercalciferol  4 mcg Intravenous Q M,W,F-HD  . heparin  5,000 Units Subcutaneous Q8H  . insulin aspart  0-9 Units Subcutaneous Q4H  . multivitamin  1 tablet Oral QHS  . sucroferric oxyhydroxide  1,000 mg Oral TID WC   Continuous Infusions: . ceFEPime (MAXIPIME) IV Stopped (01/16/21 0140)  . metronidazole Stopped (01/16/21 0534)  . norepinephrine (LEVOPHED) Adult infusion 7 mcg/min (01/16/21 0807)  . vancomycin     PRN Meds:.acetaminophen **OR** acetaminophen, ondansetron **OR** ondansetron (ZOFRAN) IV, oxyCODONE  HPI: Ryan Wells is a 65 y.o. male with past medical history of hypertension, diabetes,  ESRD on HD presented to the hospital for 2 days of nausea and vomiting.  Found to be febrile and hypotensive not responding to fluids, now required pressors in the ICU.  Blood culture grew gram-positive cocci in clusters.  Patient is seen at bedside.  He appears lethargic.  He states that he is feeling better but endorses pain all over.  States that he does not miss HD sections.  States that he has an HD catheter which was removed.  Review of Systems: Review of Systems  Gastrointestinal: Negative for nausea and vomiting.  Musculoskeletal: Positive for myalgias.    Past Medical History:  Diagnosis Date  . 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 AAA (abdominal aortic aneurysm) ([place])    Not noted on CT abd 2019  . Chest pain 11/2013   Normal Echo/ EKG/enzymes-hospitalized.  Did not get outpatient stress testing following hospitalization.  No chest pain since  . Chronic kidney disease    on dialysis Tues, Thurs and Sat  . Diabetes mellitus without complication ([place])   . Diabetes type 2, uncontrolled ([place]) 11/25/2011   no meds, diet controlled per patient  . Diabetic gastroparesis ([place]) 01/22/2017  . Hypertension    no meds  . Microalbuminuria 01/19/2017  . Myocardial infarction [facility]) 2015    Social History   Tobacco Use  . Smoking status: Former Research scientist (life sciences)  . Smokeless tobacco: Never Used  Vaping Use  . Vaping Use: Never used  Substance Use Topics  . Alcohol use: Not Currently  .  Drug use: Never    Family History  Problem Relation Age of Onset  . Heart disease Mother        cause of death--CHF?  . Diabetes Father   . Kidney disease Father        Dialysis for kidney failure  . Diabetes Sister   . Diabetes Brother   . Colon cancer Neg Hx   . Stomach cancer Neg Hx   . Esophageal cancer Neg Hx    No Known Allergies  OBJECTIVE: Blood pressure (!) 118/44, pulse 100, temperature 99.8 F (37.7 C), temperature source Oral, resp. rate 18, height '5\' 2"'$  (1.575 m), weight 69  kg, SpO2 100 %.  Physical Exam Constitutional:      General: He is not in acute distress.    Appearance: He is ill-appearing.     Comments: Patient is lethargic on exam.   HENT:     Head: Normocephalic.  Eyes:     General: No scleral icterus.       Right eye: No discharge.        Left eye: No discharge.     Conjunctiva/sclera: Conjunctivae normal.  Cardiovascular:     Rate and Rhythm: Regular rhythm. Tachycardia present.     Heart sounds: Normal heart sounds. No murmur heard.   Pulmonary:     Effort: Pulmonary effort is normal. No respiratory distress.  Abdominal:     General: Bowel sounds are normal. There is no distension.     Tenderness: There is no abdominal tenderness.  Musculoskeletal:     Comments: Left fistula warm to palpation.  Thrill palpated.  Skin:    General: Skin is warm.     Coloration: Skin is not jaundiced.  Neurological:     Mental Status: He is alert. Mental status is at baseline.     Lab Results Lab Results  Component Value Date   WBC 27.9 (H) 01/16/2021   HGB 11.7 (L) 01/16/2021   HCT 40.5 01/16/2021   MCV 91.4 01/16/2021   PLT 294 01/16/2021    Lab Results  Component Value Date   CREATININE 3.12 (H) 01/16/2021   BUN 47 (H) 01/16/2021   NA 133 (L) 01/16/2021   K 5.3 (H) 01/16/2021   CL 99 01/16/2021   CO2 19 (L) 01/16/2021    Lab Results  Component Value Date   ALT 27 01/15/2021   AST 55 (H) 01/15/2021   ALKPHOS 92 01/15/2021   BILITOT 1.3 (H) 01/15/2021     Microbiology: Recent Results (from the past 240 hour(s))  Resp Panel by RT-PCR (Flu A&B, Covid) Nasopharyngeal Swab     Status: None   Collection Time: 01/15/21  9:52 AM   Specimen: Nasopharyngeal Swab; Nasopharyngeal(NP) swabs in vial transport medium  Result Value Ref Range Status   SARS Coronavirus 2 by RT PCR NEGATIVE NEGATIVE Final    Comment: (NOTE) SARS-CoV-2 target nucleic acids are NOT DETECTED.  The SARS-CoV-2 RNA is generally detectable in upper  respiratory specimens during the acute phase of infection. The lowest concentration of SARS-CoV-2 viral copies this assay can detect is 138 copies/mL. A negative result does not preclude SARS-Cov-2 infection and should not be used as the sole basis for treatment or other patient management decisions. A negative result may occur with  improper specimen collection/handling, submission of specimen other than nasopharyngeal swab, presence of viral mutation(s) within the areas targeted by this assay, and inadequate number of viral copies(<138 copies/mL). A negative result must be combined with clinical  observations, patient history, and epidemiological information. The expected result is Negative.  Fact Sheet for Patients:  EntrepreneurPulse.com.au  Fact Sheet for Healthcare Providers:  IncredibleEmployment.be  This test is no t yet approved or cleared by the [place] FDA and  has been authorized for detection and/or diagnosis of SARS-CoV-2 by FDA under an Emergency Use Authorization (EUA). This EUA will remain  in effect (meaning this test can be used) for the duration of the COVID-19 declaration under Section 564(b)(1) of the Act, 21 U.S.C.section 360bbb-3(b)(1), unless the authorization is terminated  or revoked sooner.       Influenza A by PCR NEGATIVE NEGATIVE Final   Influenza B by PCR NEGATIVE NEGATIVE Final    Comment: (NOTE) The Xpert Xpress SARS-CoV-2/FLU/RSV plus assay is intended as an aid in the diagnosis of influenza from Nasopharyngeal swab specimens and should not be used as a sole basis for treatment. Nasal washings and aspirates are unacceptable for Xpert Xpress SARS-CoV-2/FLU/RSV testing.  Fact Sheet for Patients: EntrepreneurPulse.com.au  Fact Sheet for Healthcare Providers: IncredibleEmployment.be  This test is not yet approved or cleared by the [place] FDA and has been  authorized for detection and/or diagnosis of SARS-CoV-2 by FDA under an Emergency Use Authorization (EUA). This EUA will remain in effect (meaning this test can be used) for the duration of the COVID-19 declaration under Section 564(b)(1) of the Act, 21 U.S.C. section 360bbb-3(b)(1), unless the authorization is terminated or revoked.  Performed at [facility], [place] [address], [place], [place] [address]   Culture, blood (routine x 2)     Status: None (Preliminary result)   Collection Time: 01/15/21  8:28 PM   Specimen: BLOOD RIGHT FOREARM  Result Value Ref Range Status   Specimen Description BLOOD RIGHT FOREARM  Final   Special Requests   Final    BOTTLES DRAWN AEROBIC AND ANAEROBIC Blood Culture results may not be optimal due to an inadequate volume of blood received in culture bottles   Culture   Final    NO GROWTH < 12 HOURS Performed at [facility], [place] [address], [place], [place] [address]    Report Status PENDING  Incomplete  Culture, blood (routine x 2)     Status: None (Preliminary result)   Collection Time: 01/15/21  8:49 PM   Specimen: BLOOD  Result Value Ref Range Status   Specimen Description BLOOD RIGHT UPPER ARM  Final   Special Requests   Final    BOTTLES DRAWN AEROBIC AND ANAEROBIC Blood Culture adequate volume   Culture  Setup Time   Final    GRAM POSITIVE COCCI IN CLUSTERS IN BOTH AEROBIC AND ANAEROBIC BOTTLES Organism ID to follow Performed at [facility], [place] [address], [place], [place] [address]    Culture GRAM POSITIVE COCCI  Final   Report Status PENDING  Incomplete  MRSA PCR Screening     Status: None   Collection Time: 01/16/21 12:50 AM   Specimen: Nasal Mucosa; Nasopharyngeal  Result Value Ref Range Status   MRSA by PCR NEGATIVE NEGATIVE Final    Comment:        The GeneXpert MRSA Assay (FDA approved for NASAL specimens only), is one component of a comprehensive MRSA colonization surveillance program. It is not intended  to diagnose MRSA infection nor to guide or monitor treatment for MRSA infections. Performed at [facility], [place] [address], [place],  [address]     Gaylan Gerold, DO [facility] [phone] pager   [phone] cell 01/16/2021, 10:34 AM

## 2021-01-16 NOTE — Progress Notes (Signed)
eLink Physician-Brief Progress Note Patient Name: Ryan Wells DOB: Dec 21, 1955 MRN: [id number]   Date of Service  01/16/2021  HPI/Events of Note  65 year old man w/ hx of DM, HTN, ESRD on HD presenting with worsening nausea, vomiting x 2 days.  Presented to ER with confusion, fever, and low blood pressures. Now in iCU for septic shock from Pneumonia.   Camera: Discussed with RN. AAOx 4. Getting levo 8 mcg/min and vaso. HR 94, 95% sats on nasal o2. Appear comfortable. MAP 70. Making urine. Abdomen is soft as per RN exam, no pain.   Data:  Sodium 128 BUN 95/ Cr 14 Lactate 2.2 Trop 100 WBC 15 Plts down a bit CT with basilar peripheral almost cavitating nodule question septic emboli  A/P: 1 Septic shock : source: Lung. cavitation nodule. On pressors. S/p fluids and on abx.LA normalized now. Wean pressors - asp precautions  2. Nausea vomiting, loose bowels. unlikly cl difficle. Hyponatremia mostly hypovolemic. HIV neg. 4. ESRD on HD K is ok 5. Trending down troponin levels from demand ischemia. Hx of MI in the past.    eICU Interventions  - follow echo - on VTE -sq heparin - nephro for HD. CCM notes reviewed.     Intervention Category Major Interventions: Sepsis - evaluation and management;Hypotension - evaluation and management Evaluation Type: New Patient Evaluation  Elmer Sow 01/16/2021, 12:57 AM

## 2021-01-16 NOTE — Progress Notes (Signed)
  Echocardiogram 2D Echocardiogram with definity has been performed.  Ryan Wells M 01/16/2021, 12:00 PM

## 2021-01-16 NOTE — Sepsis Progress Note (Signed)
Delay in maxipime d/t change in patient condition and access/compatibility issues.

## 2021-01-16 NOTE — Consult Note (Addendum)
[facility] Nephrology Consult Note: Reason for Consult: To manage dialysis and dialysis related needs Referring Physician: Dr Carlis Abbott, Mickel Baas  HPI:  Ryan Wells is an 65 y.o. male.  With ESRD on HD MWF at [facility], with past medical history of DM, HTN, secondary hyperparathyroidism, anemia, last HD on 6/3 who presented to the ER with nausea vomiting and diarrhea, seen as a consultation for the management of ESRD.  He is regular with outpatient dialysis.  In the ER he was noted to be hypotensive which required some fluid resuscitation and started on Levophed.  He is admitted to ICU mainly for hypotension.  He was febrile to 101 in the ER.  Currently he is afebrile, blood pressure is maintained on Levophed at 6 mcg.  Requiring around 2 L of oxygen.  He received almost 3 L of IV fluid bolus in ER and ICU. The labs showed potassium 5.3, sodium 133, hemoglobin 11.7. He denies nausea or vomiting this morning.  No chest pain or shortness of breath.  Currently receiving broad-spectrum antibiotics including vancomycin, cefepime and Flagyl. He is being worked up for transplant at [facility].   HD Orders: [place] MWF 3:45 hrs 180NRe 400/500 68 kg 2.0K2.0 Ca UFP 4 AVF -Heparin 2200 units IV TIW -Hectorol 4 mcg IV TIW  Past Medical History:  Diagnosis Date  . 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 AAA (abdominal aortic aneurysm) ([place])    Not noted on CT abd 2019  . Chest pain 11/2013   Normal Echo/ EKG/enzymes-hospitalized.  Did not get outpatient stress testing following hospitalization.  No chest pain since  . Chronic kidney disease    on dialysis Tues, Thurs and Sat  . Diabetes mellitus without complication ([place])   . Diabetes type 2, uncontrolled ([place]) 11/25/2011   no meds, diet controlled per patient  . Diabetic gastroparesis ([place]) 01/22/2017  . Hypertension    no meds  . Microalbuminuria 01/19/2017  . Myocardial infarction [facility]) 2015    Past Surgical History:   Procedure Laterality Date  . A/V FISTULAGRAM Left 06/04/2019   Procedure: A/V FISTULAGRAM;  Surgeon: Angelia Mould, MD;  Location: [place] [facility];  Service: Cardiovascular;  Laterality: Left;  . A/V FISTULAGRAM Left 10/15/2019   Procedure: A/V FISTULAGRAM;  Surgeon: Angelia Mould, MD;  Location: [place] [facility];  Service: Cardiovascular;  Laterality: Left;  . AV FISTULA PLACEMENT Left 07/02/2018   Procedure: BRACHIOCEPHALIC ARTERIOVENOUS (AV) FISTULA CREATION LEFT ARM;  Surgeon: Serafina Mitchell, MD;  Location: [place];  Service: Vascular;  Laterality: Left;  . BASCILIC VEIN TRANSPOSITION Left 07/26/2019   Procedure: Bascilic Vein Transposition;  Surgeon: Angelia Mould, MD;  Location: [facility] OR;  Service: Vascular;  Laterality: Left;  . COLONOSCOPY    . EMBOLIZATION Left 06/04/2019   Procedure: EMBOLIZATION;  Surgeon: Angelia Mould, MD;  Location: [place] [facility];  Service: Cardiovascular;  Laterality: Left;  LT ARM FISTULA/COMPETING BRANCH  . EYE SURGERY Bilateral    cataracts x2  . EYE SURGERY    . INSERTION OF DIALYSIS CATHETER Right 06/08/2019   Procedure: INSERTION OF PALINDROME DIALYSIS CATHETER IN RIGHT INTERNAL JUGULAR;  Surgeon: Angelia Mould, MD;  Location: [place];  Service: Vascular;  Laterality: Right;  . LIGATION OF ARTERIOVENOUS  FISTULA Left 07/26/2019   Procedure: Ligation Of BrachioCephalic  Fistula;  Surgeon: Angelia Mould, MD;  Location: [facility];  Service: Vascular;  Laterality: Left;  . NECK SURGERY    .  PERIPHERAL VASCULAR BALLOON ANGIOPLASTY Left 06/04/2019   Procedure: PERIPHERAL VASCULAR BALLOON ANGIOPLASTY;  Surgeon: Angelia Mould, MD;  Location: [place] [facility];  Service: Cardiovascular;  Laterality: Left;  ARM FISTULA  . PERIPHERAL VASCULAR BALLOON ANGIOPLASTY Left 10/15/2019   Procedure: PERIPHERAL VASCULAR BALLOON ANGIOPLASTY;  Surgeon: Angelia Mould, MD;  Location: [place] [facility];   Service: Cardiovascular;  Laterality: Left;  arm fistula    Family History  Problem Relation Age of Onset  . Heart disease Mother        cause of death--CHF?  . Diabetes Father   . Kidney disease Father        Dialysis for kidney failure  . Diabetes Sister   . Diabetes Brother   . Colon cancer Neg Hx   . Stomach cancer Neg Hx   . Esophageal cancer Neg Hx     Social History:  reports that he has quit smoking. He has never used smokeless tobacco. He reports previous alcohol use. He reports that he does not use drugs.  Allergies: No Known Allergies  Medications:  I have reviewed the patient's current medications. Prior to Admission:  Medications Prior to Admission  Medication Sig Dispense Refill Last Dose  . cholecalciferol (VITAMIN D3) 25 MCG (1000 UT) tablet Take 2,000 Units by mouth daily.     . VELPHORO 500 MG chewable tablet Chew 1,000 mg by mouth 3 (three) times daily.        Results for orders placed or performed during the hospital encounter of 01/15/21 (from the past 48 hour(s))  CBG monitoring, ED     Status: Abnormal   Collection Time: 01/15/21  6:07 AM  Result Value Ref Range   Glucose-Capillary 327 (H) 70 - 99 mg/dL    Comment: Glucose reference range applies only to samples taken after fasting for at least 8 hours.  Lipase, blood     Status: None   Collection Time: 01/15/21  6:12 AM  Result Value Ref Range   Lipase 33 11 - 51 U/L    Comment: Performed at [facility] [address], [place], [place] [address]  Comprehensive metabolic panel     Status: Abnormal   Collection Time: 01/15/21  6:12 AM  Result Value Ref Range   Sodium 125 (L) 135 - 145 mmol/L    Comment: REPEATED TO VERIFY   Potassium 4.1 3.5 - 5.1 mmol/L   Chloride 83 (L) 98 - 111 mmol/L    Comment: REPEATED TO VERIFY   CO2 19 (L) 22 - 32 mmol/L    Comment: REPEATED TO VERIFY   Glucose, Bld 342 (H) 70 - 99 mg/dL    Comment: Glucose reference range applies only to samples taken after  fasting for at least 8 hours.   BUN 79 (H) 8 - 23 mg/dL   Creatinine, Ser 13.72 (H) 0.61 - 1.24 mg/dL   Calcium 8.5 (L) 8.9 - 10.3 mg/dL   Total Protein 8.2 (H) 6.5 - 8.1 g/dL   Albumin 3.7 3.5 - 5.0 g/dL   AST 42 (H) 15 - 41 U/L   ALT 29 0 - 44 U/L   Alkaline Phosphatase 100 38 - 126 U/L   Total Bilirubin 1.3 (H) 0.3 - 1.2 mg/dL   GFR, Estimated 4 (L) >60 mL/min    Comment: (NOTE) Calculated using the CKD-EPI Creatinine Equation (2021)    Anion gap 23 (H) 5 - 15    Comment: REPEATED TO VERIFY Performed at [facility], [address] [place], [place] [address]   CBC     Status: Abnormal   Collection Time: 01/15/21  6:12 AM  Result Value Ref Range   WBC 15.3 (H) 4.0 - 10.5 K/uL   RBC 3.70 (L) 4.22 - 5.81 MIL/uL   Hemoglobin 11.9 (L) 13.0 - 17.0 g/dL   HCT 34.9 (L) 39.0 - 52.0 %   MCV 94.3 80.0 - 100.0 fL   MCH 32.2 26.0 - 34.0 pg   MCHC 34.1 30.0 - 36.0 g/dL   RDW 13.3 11.5 - 15.5 %   Platelets 104 (L) 150 - 400 K/uL    Comment: Immature Platelet Fraction may be clinically indicated, consider ordering this additional test [id number] REPEATED TO VERIFY PLATELET COUNT CONFIRMED BY SMEAR    nRBC 0.0 0.0 - 0.2 %    Comment: Performed at [facility], [place] [address], [place], [place] [address]  Resp Panel by RT-PCR (Flu A&B, Covid) Nasopharyngeal Swab     Status: None   Collection Time: 01/15/21  9:52 AM   Specimen: Nasopharyngeal Swab; Nasopharyngeal(NP) swabs in vial transport medium  Result Value Ref Range   SARS Coronavirus 2 by RT PCR NEGATIVE NEGATIVE    Comment: (NOTE) SARS-CoV-2 target nucleic acids are NOT DETECTED.  The SARS-CoV-2 RNA is generally detectable in upper respiratory specimens during the acute phase of infection. The lowest concentration of SARS-CoV-2 viral copies this assay can detect is 138 copies/mL. A negative result does not preclude SARS-Cov-2 infection and should not be used as the sole basis for treatment or other patient  management decisions. A negative result may occur with  improper specimen collection/handling, submission of specimen other than nasopharyngeal swab, presence of viral mutation(s) within the areas targeted by this assay, and inadequate number of viral copies(<138 copies/mL). A negative result must be combined with clinical observations, patient history, and epidemiological information. The expected result is Negative.  Fact Sheet for Patients:  EntrepreneurPulse.com.au  Fact Sheet for Healthcare Providers:  IncredibleEmployment.be  This test is no t yet approved or cleared by the [place] FDA and  has been authorized for detection and/or diagnosis of SARS-CoV-2 by FDA under an Emergency Use Authorization (EUA). This EUA will remain  in effect (meaning this test can be used) for the duration of the COVID-19 declaration under Section 564(b)(1) of the Act, 21 U.S.C.section 360bbb-3(b)(1), unless the authorization is terminated  or revoked sooner.       Influenza A by PCR NEGATIVE NEGATIVE   Influenza B by PCR NEGATIVE NEGATIVE    Comment: (NOTE) The Xpert Xpress SARS-CoV-2/FLU/RSV plus assay is intended as an aid in the diagnosis of influenza from Nasopharyngeal swab specimens and should not be used as a sole basis for treatment. Nasal washings and aspirates are unacceptable for Xpert Xpress SARS-CoV-2/FLU/RSV testing.  Fact Sheet for Patients: EntrepreneurPulse.com.au  Fact Sheet for Healthcare Providers: IncredibleEmployment.be  This test is not yet approved or cleared by the [place] FDA and has been authorized for detection and/or diagnosis of SARS-CoV-2 by FDA under an Emergency Use Authorization (EUA). This EUA will remain in effect (meaning this test can be used) for the duration of the COVID-19 declaration under Section 564(b)(1) of the Act, 21 U.S.C. section 360bbb-3(b)(1), unless the  authorization is terminated or revoked.  Performed at [facility], [place] [address], [place], [place] [address]   Beta-hydroxybutyric acid     Status: Abnormal   Collection Time: 01/15/21  1:36 PM  Result  Value Ref Range   Beta-Hydroxybutyric Acid 0.42 (H) 0.05 - 0.27 mmol/L    Comment: Performed at [facility] [address] [place], [place] [address]  I-Stat venous blood gas, ED     Status: Abnormal   Collection Time: 01/15/21  1:42 PM  Result Value Ref Range   pH, Ven 7.393 7.250 - 7.430   pCO2, Ven 38.3 (L) 44.0 - 60.0 mmHg   pO2, Ven 29.0 (LL) 32.0 - 45.0 mmHg   Bicarbonate 23.4 20.0 - 28.0 mmol/L   TCO2 25 22 - 32 mmol/L   O2 Saturation 56.0 %   Acid-base deficit 1.0 0.0 - 2.0 mmol/L   Sodium 128 (L) 135 - 145 mmol/L   Potassium 4.0 3.5 - 5.1 mmol/L   Calcium, Ion 0.94 (L) 1.15 - 1.40 mmol/L   HCT 30.0 (L) 39.0 - 52.0 %   Hemoglobin 10.2 (L) 13.0 - 17.0 g/dL   Sample type VENOUS    Comment NOTIFIED PHYSICIAN   CBG monitoring, ED     Status: Abnormal   Collection Time: 01/15/21  1:50 PM  Result Value Ref Range   Glucose-Capillary 165 (H) 70 - 99 mg/dL    Comment: Glucose reference range applies only to samples taken after fasting for at least 8 hours.  CBG monitoring, ED     Status: Abnormal   Collection Time: 01/15/21  4:10 PM  Result Value Ref Range   Glucose-Capillary 194 (H) 70 - 99 mg/dL    Comment: Glucose reference range applies only to samples taken after fasting for at least 8 hours.  CBG monitoring, ED     Status: Abnormal   Collection Time: 01/15/21  8:04 PM  Result Value Ref Range   Glucose-Capillary 198 (H) 70 - 99 mg/dL    Comment: Glucose reference range applies only to samples taken after fasting for at least 8 hours.  Ethanol     Status: None   Collection Time: 01/15/21  8:15 PM  Result Value Ref Range   Alcohol, Ethyl (B) <10 <10 mg/dL    Comment: (NOTE) Lowest detectable limit for serum alcohol is 10 mg/dL.  For medical purposes  only. Performed at [facility], [place] [address] [place], [place] [address]   Culture, blood (routine x 2)     Status: None (Preliminary result)   Collection Time: 01/15/21  8:28 PM   Specimen: BLOOD RIGHT FOREARM  Result Value Ref Range   Specimen Description BLOOD RIGHT FOREARM    Special Requests      BOTTLES DRAWN AEROBIC AND ANAEROBIC Blood Culture results may not be optimal due to an inadequate volume of blood received in culture bottles   Culture      NO GROWTH < 12 HOURS Performed at [facility], [place] [address] [place], [place] [address]    Report Status PENDING   CBC with Differential/Platelet     Status: Abnormal   Collection Time: 01/15/21  8:49 PM  Result Value Ref Range   WBC 10.0 4.0 - 10.5 K/uL   RBC 3.67 (L) 4.22 - 5.81 MIL/uL   Hemoglobin 11.5 (L) 13.0 - 17.0 g/dL   HCT 34.3 (L) 39.0 - 52.0 %   MCV 93.5 80.0 - 100.0 fL   MCH 31.3 26.0 - 34.0 pg   MCHC 33.5 30.0 - 36.0 g/dL   RDW 13.3 11.5 - 15.5 %   Platelets 93 (L) 150 - 400 K/uL    Comment: Immature Platelet Fraction may  be clinically indicated, consider ordering this additional test [id number] CONSISTENT WITH PREVIOUS RESULT    nRBC 0.0 0.0 - 0.2 %   Neutrophils Relative % 89 %   Neutro Abs 9.0 (H) 1.7 - 7.7 K/uL   Lymphocytes Relative 4 %   Lymphs Abs 0.4 (L) 0.7 - 4.0 K/uL   Monocytes Relative 6 %   Monocytes Absolute 0.6 0.1 - 1.0 K/uL   Eosinophils Relative 0 %   Eosinophils Absolute 0.0 0.0 - 0.5 K/uL   Basophils Relative 0 %   Basophils Absolute 0.0 0.0 - 0.1 K/uL   Immature Granulocytes 1 %   Abs Immature Granulocytes 0.06 0.00 - 0.07 K/uL    Comment: Performed at [facility], [address], [place], [place] [address]  Comprehensive metabolic panel     Status: Abnormal   Collection Time: 01/15/21  8:49 PM  Result Value Ref Range   Sodium 128 (L) 135 - 145 mmol/L   Potassium 4.5 3.5 - 5.1 mmol/L    Comment: SPECIMEN HEMOLYZED. HEMOLYSIS MAY AFFECT INTEGRITY OF RESULTS.    Chloride 89 (L) 98 - 111 mmol/L   CO2 21 (L) 22 - 32 mmol/L   Glucose, Bld 182 (H) 70 - 99 mg/dL    Comment: Glucose reference range applies only to samples taken after fasting for at least 8 hours.   BUN 95 (H) 8 - 23 mg/dL   Creatinine, Ser 14.22 (H) 0.61 - 1.24 mg/dL   Calcium 7.8 (L) 8.9 - 10.3 mg/dL   Total Protein 6.7 6.5 - 8.1 g/dL   Albumin 3.1 (L) 3.5 - 5.0 g/dL   AST 55 (H) 15 - 41 U/L   ALT 27 0 - 44 U/L   Alkaline Phosphatase 92 38 - 126 U/L   Total Bilirubin 1.3 (H) 0.3 - 1.2 mg/dL   GFR, Estimated 3 (L) >60 mL/min    Comment: (NOTE) Calculated using the CKD-EPI Creatinine Equation (2021)    Anion gap 18 (H) 5 - 15    Comment: Performed at [facility], [place] [address] [place], [place] [address]  Magnesium     Status: None   Collection Time: 01/15/21  8:49 PM  Result Value Ref Range   Magnesium 2.2 1.7 - 2.4 mg/dL    Comment: Performed at [facility], [place] [address], [place], [place] [address]  Phosphorus     Status: None   Collection Time: 01/15/21  8:49 PM  Result Value Ref Range   Phosphorus 4.5 2.5 - 4.6 mg/dL    Comment: Performed at [facility], [place] [address] [place], [place] [address]  Lactic acid, plasma     Status: Abnormal   Collection Time: 01/15/21  8:49 PM  Result Value Ref Range   Lactic Acid, Venous 2.2 (HH) 0.5 - 1.9 mmol/L    Comment: CRITICAL RESULT CALLED TO, READ BACK BY AND VERIFIED WITH: HARRIS [facility] 01/15/21 2215 WAYK Performed at [facility], [place] [address], [place], [place] [address]   Troponin I (High Sensitivity)     Status: Abnormal   Collection Time: 01/15/21  8:49 PM  Result Value Ref Range   Troponin I (High Sensitivity) 114 (HH) <18 ng/L    Comment: CRITICAL RESULT CALLED TO, READ BACK BY AND VERIFIED WITH: MUNNETT [facility] 01/15/21 2224 WAYK Performed at [facility], [place] [address] [place],  [address]   CBG monitoring, ED     Status: Abnormal  Collection Time: 01/15/21  9:00 PM   Result Value Ref Range   Glucose-Capillary 202 (H) 70 - 99 mg/dL    Comment: Glucose reference range applies only to samples taken after fasting for at least 8 hours.  HIV Antibody (routine testing w rflx)     Status: None   Collection Time: 01/15/21  9:05 PM  Result Value Ref Range   HIV Screen 4th Generation wRfx Non Reactive Non Reactive    Comment: Performed at [facility], [place] [address], [place], [place] [address]  Osmolality     Status: Abnormal   Collection Time: 01/15/21  9:05 PM  Result Value Ref Range   Osmolality 306 (H) 275 - 295 mOsm/kg    Comment: Performed at [facility], [place]  [address], [place], [place] [address]  Troponin I (High Sensitivity)     Status: Abnormal   Collection Time: 01/15/21 11:47 PM  Result Value Ref Range   Troponin I (High Sensitivity) 98 (H) <18 ng/L    Comment: (NOTE) Elevated high sensitivity troponin I (hsTnI) values and significant  changes across serial measurements may suggest ACS but many other  chronic and acute conditions are known to elevate hsTnI results.  Refer to the "Links" section for chest pain algorithms and additional  guidance. Performed at [facility], [place] [address] [place], [place] [address]   Lactic acid, plasma     Status: None   Collection Time: 01/15/21 11:47 PM  Result Value Ref Range   Lactic Acid, Venous 1.7 0.5 - 1.9 mmol/L    Comment: Performed at [facility] [address], [place], [place] [address]  MRSA PCR Screening     Status: None   Collection Time: 01/16/21 12:50 AM   Specimen: Nasal Mucosa; Nasopharyngeal  Result Value Ref Range   MRSA by PCR NEGATIVE NEGATIVE    Comment:        The GeneXpert MRSA Assay (FDA approved for NASAL specimens only), is one component of a comprehensive MRSA colonization surveillance program. It is not intended to diagnose MRSA infection nor to guide or monitor treatment for MRSA infections. Performed at [facility], [place]  [address] [place], [place] [address]   Renal function panel     Status: Abnormal   Collection Time: 01/16/21  1:03 AM  Result Value Ref Range   Sodium 133 (L) 135 - 145 mmol/L   Potassium 5.3 (H) 3.5 - 5.1 mmol/L    Comment: NO VISIBLE HEMOLYSIS   Chloride 99 98 - 111 mmol/L   CO2 19 (L) 22 - 32 mmol/L   Glucose, Bld 153 (H) 70 - 99 mg/dL    Comment: Glucose reference range applies only to samples taken after fasting for at least 8 hours.   BUN 47 (H) 8 - 23 mg/dL   Creatinine, Ser 3.12 (H) 0.61 - 1.24 mg/dL    Comment: DELTA CHECK NOTED   Calcium 8.5 (L) 8.9 - 10.3 mg/dL   Phosphorus 8.3 (H) 2.5 - 4.6 mg/dL   Albumin 3.0 (L) 3.5 - 5.0 g/dL   GFR, Estimated 21 (L) >60 mL/min    Comment: (NOTE) Calculated using the CKD-EPI Creatinine Equation (2021)    Anion gap 15 5 - 15    Comment: Performed at [facility] [address], [place], [place] [address]  CBC with Differential/Platelet     Status: Abnormal   Collection Time: 01/16/21  1:03 AM  Result Value Ref  Range   WBC 27.9 (H) 4.0 - 10.5 K/uL   RBC 4.43 4.22 - 5.81 MIL/uL   Hemoglobin 11.7 (L) 13.0 - 17.0 g/dL   HCT 40.5 39.0 - 52.0 %   MCV 91.4 80.0 - 100.0 fL   MCH 26.4 26.0 - 34.0 pg   MCHC 28.9 (L) 30.0 - 36.0 g/dL   RDW 16.8 (H) 11.5 - 15.5 %   Platelets 294 150 - 400 K/uL    Comment: REPEATED TO VERIFY   nRBC 0.0 0.0 - 0.2 %   Neutrophils Relative % 85 %   Neutro Abs 23.7 (H) 1.7 - 7.7 K/uL   Lymphocytes Relative 5 %   Lymphs Abs 1.3 0.7 - 4.0 K/uL   Monocytes Relative 9 %   Monocytes Absolute 2.4 (H) 0.1 - 1.0 K/uL   Eosinophils Relative 0 %   Eosinophils Absolute 0.0 0.0 - 0.5 K/uL   Basophils Relative 0 %   Basophils Absolute 0.1 0.0 - 0.1 K/uL   Immature Granulocytes 1 %   Abs Immature Granulocytes 0.40 (H) 0.00 - 0.07 K/uL   Polychromasia PRESENT     Comment: Performed at [facility], [address], [place], [place] [address]  Glucose, capillary     Status: Abnormal   Collection Time: 01/16/21   1:23 AM  Result Value Ref Range   Glucose-Capillary 163 (H) 70 - 99 mg/dL    Comment: Glucose reference range applies only to samples taken after fasting for at least 8 hours.  Glucose, capillary     Status: Abnormal   Collection Time: 01/16/21  4:31 AM  Result Value Ref Range   Glucose-Capillary 172 (H) 70 - 99 mg/dL    Comment: Glucose reference range applies only to samples taken after fasting for at least 8 hours.  Blood gas, venous     Status: Abnormal   Collection Time: 01/16/21  4:59 AM  Result Value Ref Range   FIO2 21.00    pH, Ven 7.279 7.250 - 7.430   pCO2, Ven 38.3 (L) 44.0 - 60.0 mmHg   pO2, Ven 33.1 32.0 - 45.0 mmHg   Bicarbonate 17.4 (L) 20.0 - 28.0 mmol/L   Acid-base deficit 8.1 (H) 0.0 - 2.0 mmol/L   O2 Saturation 52.5 %   Patient temperature 37.0    Collection site RIGHT RADIAL    Drawn by DRAWN BY PHELEBOTOMY    Sample type VENOUS     Comment: Performed at [facility] [address], [place], [place] [address]  Glucose, capillary     Status: Abnormal   Collection Time: 01/16/21  6:59 AM  Result Value Ref Range   Glucose-Capillary 177 (H) 70 - 99 mg/dL    Comment: Glucose reference range applies only to samples taken after fasting for at least 8 hours.    CT Abdomen Pelvis Wo Contrast  Result Date: 01/15/2021 CLINICAL DATA:  Acute abdominal pain, nonlocalized abdominal pain in a 65 year old male. EXAM: CT ABDOMEN AND PELVIS WITHOUT CONTRAST TECHNIQUE: Multidetector CT imaging of the abdomen and pelvis was performed following the standard protocol without IV contrast. COMPARISON:  April 08, 2018. FINDINGS: Lower chest: Bilateral nodular opacities somewhat ill-defined. Area with ground-glass features and bandlike appearance on image 15 of series 4 measuring 11 x 7 mm. Slightly denser more nodular area just above the LEFT hemidiaphragm on image 16 of series 4 measuring 11 x 6 mm. 10 x 9 mm basilar opacity with ground-glass features in the RIGHT lower lobe  (  image 10/4) no effusion. No consolidative changes. 8 x 4 mm pleural-based area, ground-glass and solid component w. Hepatobiliary: No focal lesion on noncontrast imaging. Cholelithiasis. Gallbladder collapse without pericholecystic stranding. Pancreas: Pancreas without contour abnormality or signs of inflammation. Spleen: Spleen normal size and contour. Adrenals/Urinary Tract: Adrenal glands are normal. Scarring of the bilateral kidneys with signs of parenchymal loss since previous imaging in this patient with end-stage renal disease. No nephrolithiasis. No ureteral calculi. Urinary bladder is normal. Stomach/Bowel: The appendix is normal. No sign of small bowel obstruction. No stranding adjacent to stomach or colon. Vascular/Lymphatic: Calcified atheromatous plaque in the abdominal aorta. No aneurysmal dilation. Smooth contour of the IVC. There is no gastrohepatic or hepatoduodenal ligament lymphadenopathy. No retroperitoneal or mesenteric lymphadenopathy. No pelvic sidewall lymphadenopathy. Reproductive: Prostate unremarkable by CT. Other: No ascites. Musculoskeletal: No acute bone finding. No destructive bone process. Spinal degenerative changes. IMPRESSION: 1. No acute findings in the abdomen or pelvis. 2. Bilateral nodular opacities with ground-glass features in the lung bases. These findings are nonspecific and could potentially represent infectious or inflammatory changes. Correlate with any history of infection or respiratory symptoms including COVID-19 pneumonia. Suggest 8-12 week follow-up to exclude the possibility of neoplasm. 3. Cholelithiasis without evidence of acute cholecystitis. 4. Scarring of the bilateral kidneys with signs of parenchymal loss since previous imaging in this patient with end-stage renal disease. 5. Aortic atherosclerosis. Aortic Atherosclerosis (ICD10-I70.0). Electronically Signed   By: Zetta Bills M.D.   On: 01/15/2021 08:26   DG Elbow 2 Views Left  Result Date:  01/15/2021 CLINICAL DATA:  Left elbow swelling EXAM: LEFT ELBOW - 2 VIEW COMPARISON:  None. FINDINGS: No fracture or dislocation is seen. The joint spaces are preserved. No displaced elbow joint fat pads suggest an elbow joint effusion. Mild soft tissue swelling overlying the olecranon. In the absence of trauma, this appearance suggests olecranon bursitis. Surgical clips in the visualized soft tissues. IMPRESSION: Mild soft tissue swelling overlying the olecranon. In the absence of trauma, this appearance suggests olecranon bursitis. Electronically Signed   By: Julian Hy M.D.   On: 01/15/2021 21:45   US Abdomen Limited  Result Date: 01/15/2021 CLINICAL DATA:  Right upper quadrant pain EXAM: ULTRASOUND ABDOMEN LIMITED RIGHT UPPER QUADRANT COMPARISON:  CT abdomen and pelvis January 15, 2021 FINDINGS: Gallbladder: Within the gallbladder, there is a 3 mm echogenic focus which does not show significant shadowing but is felt to move slightly. There is an inherent 2 mm echogenic focus which shadows in the gallbladder. There is felt to be cholelithiasis. Cholelithiasis is actually better delineated by CT compared to this study. No gallbladder wall thickening or pericholecystic fluid. No sonographic Murphy sign noted by sonographer. Common bile duct: Diameter: 3 mm. No intrahepatic or extrahepatic biliary duct dilatation. Liver: No focal lesion identified. Within normal limits in parenchymal echogenicity. Portal vein is patent on color Doppler imaging with normal direction of blood flow towards the liver. Other: None. IMPRESSION: Small apparent gallstones, better delineated by recent CT. No gallbladder wall thickening or pericholecystic fluid evident. Study otherwise unremarkable. Electronically Signed   By: Lowella Grip III M.D.   On: 01/15/2021 11:19   DG Chest Port 1 View  Result Date: 01/15/2021 CLINICAL DATA:  Abdominal pain for 3 days.  Nausea vomiting. EXAM: PORTABLE CHEST 1 VIEW COMPARISON:  Chest x-ray  06/08/2019, CT chest 11/27/2013 FINDINGS: Slightly increased in enlarged cardiac silhouette. The heart size and mediastinal contours are otherwise unchanged. Aortic calcification. Bilateral lower lobe patchy airspace opacities most  prominent within the peripheries. No focal consolidation. Similar-appearing slightly increased interstitial markings. Blunting of the right costophrenic angle with no definite pleural effusion. No pneumothorax. No acute osseous abnormality.  Cervical surgical hardware. IMPRESSION: 1. Bilateral lower lobe patchy airspace opacities that are most prominent within the peripheries. Findings suggestive of infection/inflammation. COVID-19 infection not excluded. 2. Slightly increased in enlarged cardiac silhouette. 3.  Aortic Atherosclerosis (ICD10-I70.0). Electronically Signed   By: Iven Finn M.D.   On: 01/15/2021 22:20    ROS:  Blood pressure (!) 118/44, pulse 100, temperature 99.8 F (37.7 C), temperature source Oral, resp. rate 18, height '5\' 2"'$  (1.575 m), weight 69 kg, SpO2 100 %. Gen: NAD, comfortable Respiratory: Clear bilateral, no wheezing or crackle Cardiovascular: Regular rate rhythm S1-S2 normal, no rubs GI: Abdomen soft, nontender, nondistended Extremities, no cyanosis or clubbing, no edema Skin: No rash or ulcer Neurology: Alert, awake, following commands, oriented Dialysis Access: AV fistula has good thrill and bruit.  Assessment/Plan:  #Septic shock related with GI symptoms, currently in the ICU for Levophed.  Blood pressure maintaining in acceptable range on Levophed 6 mcg.  Overall volume looks acceptable.  Currently on broad-spectrum antibiotics.  Per primary team.  # ESRD: MWF, plan for HD today off schedule as he did not receive it yesterday.  We will plan for no net ultrafiltration because of hypotension.  2K bath and AV fistula for the access.  Hopefully he will tolerate IHD while on a small dose Levophed.  # Anemia of ESRD: Hemoglobin acceptable.   Continue to monitor.  #Secondary hyperparathyroidism: Check phosphorus level.  Continue Hectorol and Velphoro. Currently on liquid diet.  Monitor lab.  Thank you for the consult.  We will follow with you.  Marquette Piontek Tanna Furry 01/16/2021, 8:53 AM

## 2021-01-16 NOTE — Progress Notes (Signed)
K 3.2, patient done with HD. Nephrology MD Johnney Ou notified. Orders to repeat BMP and call back with results.

## 2021-01-16 NOTE — Progress Notes (Signed)
.   PHARMACY - PHYSICIAN COMMUNICATION CRITICAL VALUE ALERT - BLOOD CULTURE IDENTIFICATION (BCID)  Ryan Wells Scrape is an 65 y.o. male who presented to [facility] on 01/15/2021 with a chief complaint of nausea and vomiting.   Assessment: 64 year old male on dialysis admitted with fevers and hypotension. Now found to have MSSA in 4/4 blood cultures. Had an HD cath that was removed.    Name of physician (or Provider) Contacted: ID team (Dr. Alfonse Spruce and Dr. Linus Salmons) and 2H pharmacist   Current antibiotics: Vanc/cefepime/flagyl   Changes to prescribed antibiotics recommended:  Narrow to Ancef 1 gm q 24 hours while off schedule with dialysis   Results for orders placed or performed during the hospital encounter of 01/15/21  Blood Culture ID Panel (Reflexed) (Collected: 01/15/2021  8:49 PM)  Result Value Ref Range   Enterococcus faecalis NOT DETECTED NOT DETECTED   Enterococcus Faecium NOT DETECTED NOT DETECTED   Listeria monocytogenes NOT DETECTED NOT DETECTED   Staphylococcus species DETECTED (A) NOT DETECTED   Staphylococcus aureus (BCID) DETECTED (A) NOT DETECTED   Staphylococcus epidermidis NOT DETECTED NOT DETECTED   Staphylococcus lugdunensis NOT DETECTED NOT DETECTED   Streptococcus species NOT DETECTED NOT DETECTED   Streptococcus agalactiae NOT DETECTED NOT DETECTED   Streptococcus pneumoniae NOT DETECTED NOT DETECTED   Streptococcus pyogenes NOT DETECTED NOT DETECTED   A.calcoaceticus-baumannii NOT DETECTED NOT DETECTED   Bacteroides fragilis NOT DETECTED NOT DETECTED   Enterobacterales NOT DETECTED NOT DETECTED   Enterobacter cloacae complex NOT DETECTED NOT DETECTED   Escherichia coli NOT DETECTED NOT DETECTED   Klebsiella aerogenes NOT DETECTED NOT DETECTED   Klebsiella oxytoca NOT DETECTED NOT DETECTED   Klebsiella pneumoniae NOT DETECTED NOT DETECTED   Proteus species NOT DETECTED NOT DETECTED   Salmonella species NOT DETECTED NOT DETECTED   Serratia marcescens NOT  DETECTED NOT DETECTED   Haemophilus influenzae NOT DETECTED NOT DETECTED   Neisseria meningitidis NOT DETECTED NOT DETECTED   Pseudomonas aeruginosa NOT DETECTED NOT DETECTED   Stenotrophomonas maltophilia NOT DETECTED NOT DETECTED   Candida albicans NOT DETECTED NOT DETECTED   Candida auris NOT DETECTED NOT DETECTED   Candida glabrata NOT DETECTED NOT DETECTED   Candida krusei NOT DETECTED NOT DETECTED   Candida parapsilosis NOT DETECTED NOT DETECTED   Candida tropicalis NOT DETECTED NOT DETECTED   Cryptococcus neoformans/gattii NOT DETECTED NOT DETECTED   Meth resistant mecA/C and MREJ NOT DETECTED NOT DETECTED    Jimmy Footman, PharmD, BCPS, BCIDP Infectious Diseases Clinical Pharmacist Phone: [phone] 01/16/2021  10:58 AM

## 2021-01-17 ENCOUNTER — Inpatient Hospital Stay (HOSPITAL_COMMUNITY): Payer: Medicare HMO

## 2021-01-17 DIAGNOSIS — I4891 Unspecified atrial fibrillation: Secondary | ICD-10-CM

## 2021-01-17 DIAGNOSIS — A0472 Enterocolitis due to Clostridium difficile, not specified as recurrent: Secondary | ICD-10-CM

## 2021-01-17 LAB — GASTROINTESTINAL PANEL BY PCR, STOOL (REPLACES STOOL CULTURE)

## 2021-01-17 LAB — MAGNESIUM
Magnesium: 1.9 mg/dL (ref 1.7–2.4)
Magnesium: 2.6 mg/dL — ABNORMAL HIGH (ref 1.7–2.4)

## 2021-01-17 LAB — BASIC METABOLIC PANEL
Anion gap: 15 (ref 5–15)
Anion gap: 15 (ref 5–15)
Anion gap: 13 (ref 5–15)
BUN: 48 mg/dL — ABNORMAL HIGH (ref 8–23)
BUN: 50 mg/dL — ABNORMAL HIGH (ref 8–23)
BUN: 56 mg/dL — ABNORMAL HIGH (ref 8–23)
CO2: 23 mmol/L (ref 22–32)
CO2: 24 mmol/L (ref 22–32)
CO2: 24 mmol/L (ref 22–32)
Calcium: 7.5 mg/dL — ABNORMAL LOW (ref 8.9–10.3)
Calcium: 7.6 mg/dL — ABNORMAL LOW (ref 8.9–10.3)
Calcium: 7.5 mg/dL — ABNORMAL LOW (ref 8.9–10.3)
Chloride: 94 mmol/L — ABNORMAL LOW (ref 98–111)
Chloride: 93 mmol/L — ABNORMAL LOW (ref 98–111)
Chloride: 94 mmol/L — ABNORMAL LOW (ref 98–111)
Creatinine, Ser: 8.77 mg/dL — ABNORMAL HIGH (ref 0.61–1.24)
Creatinine, Ser: 9.2 mg/dL — ABNORMAL HIGH (ref 0.61–1.24)
Creatinine, Ser: 9.5 mg/dL — ABNORMAL HIGH (ref 0.61–1.24)
GFR, Estimated: 6 mL/min — ABNORMAL LOW (ref >60)
GFR, Estimated: 6 mL/min — ABNORMAL LOW (ref >60)
GFR, Estimated: 6 mL/min — ABNORMAL LOW (ref >60)
Glucose, Bld: 143 mg/dL — ABNORMAL HIGH (ref 70–99)
Glucose, Bld: 128 mg/dL — ABNORMAL HIGH (ref 70–99)
Glucose, Bld: 177 mg/dL — ABNORMAL HIGH (ref 70–99)
Potassium: 2.7 mmol/L — CL (ref 3.5–5.1)
Potassium: 3.8 mmol/L (ref 3.5–5.1)
Potassium: 3.4 mmol/L — ABNORMAL LOW (ref 3.5–5.1)
Sodium: 132 mmol/L — ABNORMAL LOW (ref 135–145)
Sodium: 132 mmol/L — ABNORMAL LOW (ref 135–145)
Sodium: 131 mmol/L — ABNORMAL LOW (ref 135–145)

## 2021-01-17 LAB — CBC
HCT: 28 % — ABNORMAL LOW (ref 39.0–52.0)
Hemoglobin: 9.7 g/dL — ABNORMAL LOW (ref 13.0–17.0)
MCH: 31.7 pg (ref 26.0–34.0)
MCHC: 34.6 g/dL (ref 30.0–36.0)
MCV: 91.5 fL (ref 80.0–100.0)
Platelets: 73 10*3/uL — ABNORMAL LOW (ref 150–400)
RBC: 3.06 MIL/uL — ABNORMAL LOW (ref 4.22–5.81)
RDW: 13.4 % (ref 11.5–15.5)
WBC: 10.5 10*3/uL (ref 4.0–10.5)
nRBC: 0 % (ref 0.0–0.2)

## 2021-01-17 LAB — GLUCOSE, CAPILLARY
Glucose-Capillary: 111 mg/dL — ABNORMAL HIGH (ref 70–99)
Glucose-Capillary: 149 mg/dL — ABNORMAL HIGH (ref 70–99)
Glucose-Capillary: 151 mg/dL — ABNORMAL HIGH (ref 70–99)
Glucose-Capillary: 152 mg/dL — ABNORMAL HIGH (ref 70–99)
Glucose-Capillary: 182 mg/dL — ABNORMAL HIGH (ref 70–99)
Glucose-Capillary: 201 mg/dL — ABNORMAL HIGH (ref 70–99)

## 2021-01-17 LAB — HEMOGLOBIN A1C
Hgb A1c MFr Bld: 5.1 % (ref 4.8–5.6)
Mean Plasma Glucose: 100 mg/dL

## 2021-01-17 IMAGING — MR MR CERVICAL SPINE W/O CM
4 of 5 series · 13 of 48 positions shown · non-contrast
Comparison: None.
COMPARISON: [DATE].
COMPARISON: None.
COMPARISON: None.

Addendum:
CLINICAL DATA: Fall fall neck pain, mid back pain and lower back
pain. MSSA bacteremia. Rule out discitis/osteomyelitis.

EXAM:
MRI CERVICAL, THORACIC AND LUMBAR SPINE WITHOUT CONTRAST
TECHNIQUE: Multiplanar and multiecho pulse sequences of the cervical spine, to
include the craniocervical junction and cervicothoracic junction,
and thoracic and lumbar spine, were obtained without intravenous
contrast.
CLINICAL DATA: Chest pain during hemodialysis with nausea and
vomiting. Sepsis.
TECHNIQUE: Multi detector CT imaging of the chest was performed
without the administration of IV contrast.

[Series 21: t1_tse_warp_sag · sagittal · 3.0mm · 0.45mm/px · 4 of 15 slices shown]
[im 1/15]
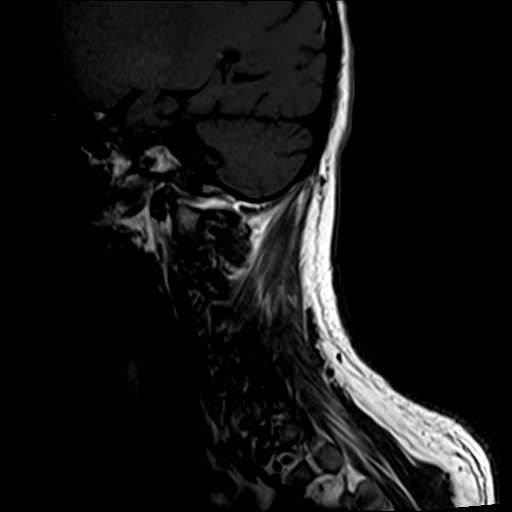
[im 3/15]
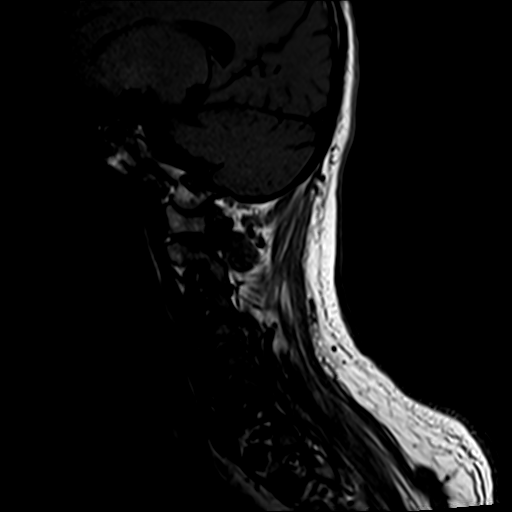
[im 9/15]
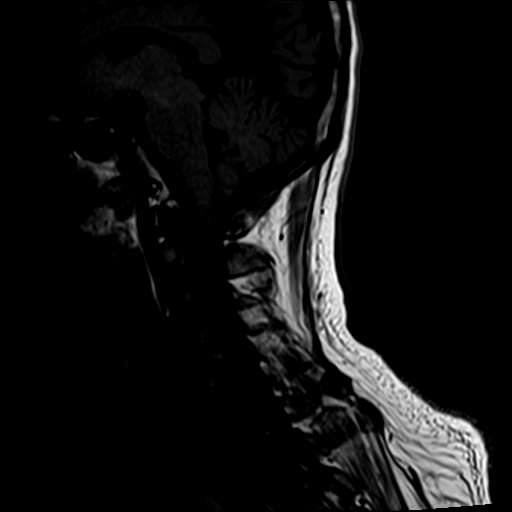
[im 15/15]
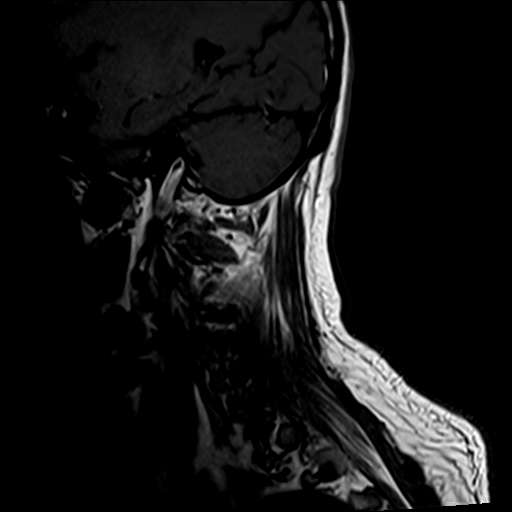

[Series 22: t2_tse_stir_warp_sag · sagittal · 3.0mm · 0.90mm/px · 3 of 15 slices shown]
[im 3/15]
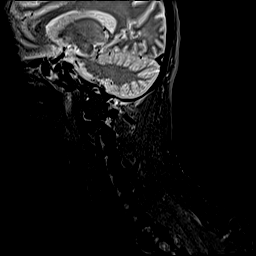
[im 9/15]
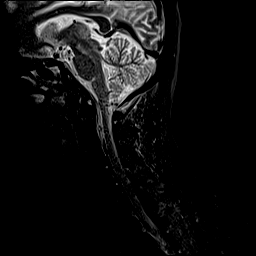
[im 15/15]
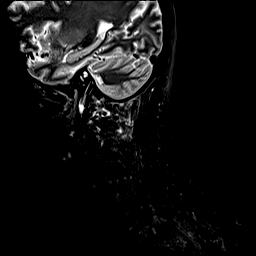

[Series 23: t2_tse_warp_sag · sagittal · 3.0mm · 0.45mm/px · 3 of 15 slices shown]
[im 3/15]
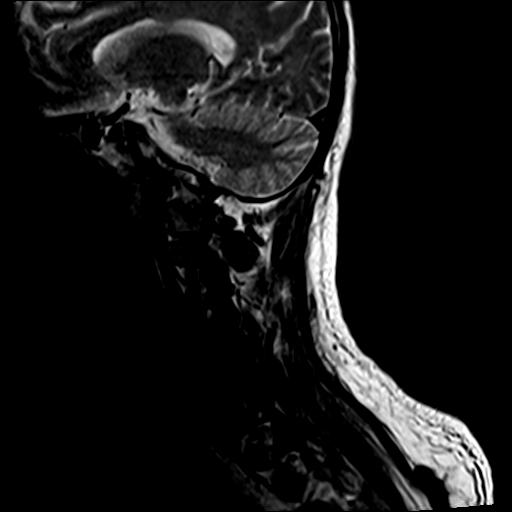
[im 9/15]
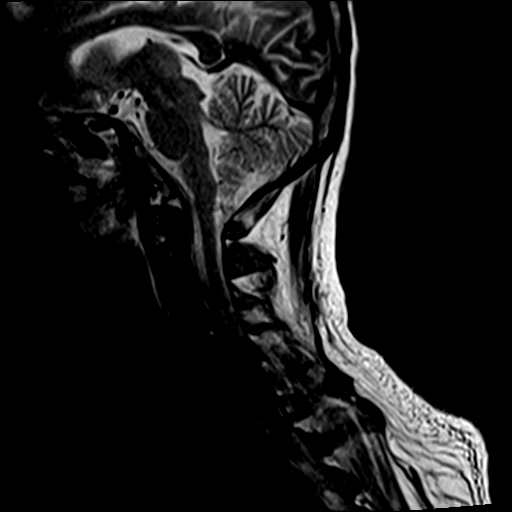
[im 15/15]
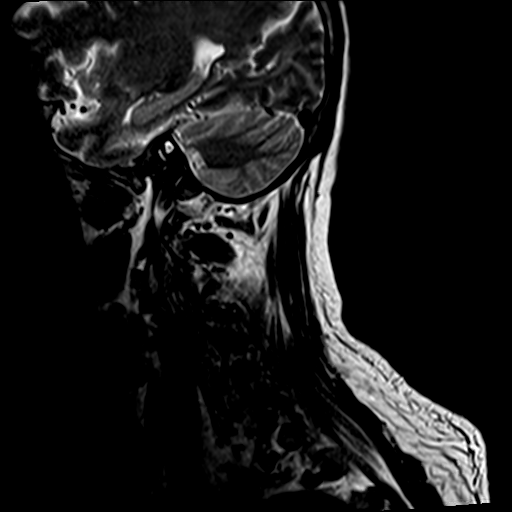

[Series 24: t2_tse_warp_tra · axial · 3.0mm · 0.78mm/px · z∈[-132,-44]mm · 3 of 40 slices shown]
[im 6/40]
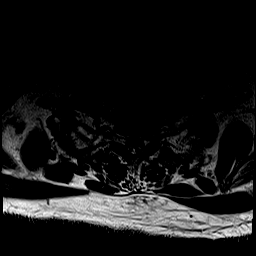
[im 20/40]
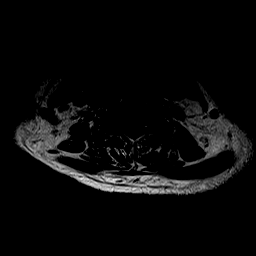
[im 34/40]
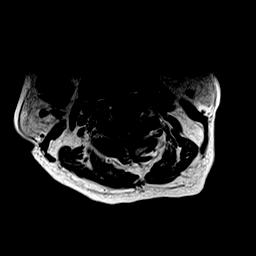

[13 of 48 positions shown; findings below may reference images not displayed]

FINDINGS: MRI CERVICAL SPINE FINDINGS

Alignment: Straightening of the cervical curvature.

Vertebrae: No fracture, evidence of discitis, or bone lesion.
Postsurgical changes from C4 through C7 ACDF.

Cord: Normal signal and morphology.

Posterior Fossa, vertebral arteries, paraspinal tissues: A 7 mm T1
hyperintense/T2 hypointense lesion within the sella. Correlation
with dedicated study suggested.

Disc levels:

C2-3: No spinal canal or neural foraminal stenosis.

C3-4: Small posterior disc protrusion without significant spinal
canal stenosis. Uncovertebral and facet degenerative changes
resulting in mild left neural foraminal narrowing.

C4-5: No spinal canal or neural foraminal stenosis.

C5-6: Uncovertebral and facet degenerative changes resulting in
moderate bilateral neural foraminal narrowing. Mild spinal canal
stenosis.

C6-7: Uncovertebral and facet degenerative changes resulting in mild
bilateral neural foraminal narrowing. Mild spinal canal stenosis.

C7-T1: Small posterior disc protrusion and facet degenerative
changes. No spinal canal or neural foraminal stenosis.

MRI THORACIC SPINE FINDINGS

Alignment:  Physiologic.

Vertebrae: No fracture, evidence of discitis, or aggressive bone
lesion.

Cord: Anterior displacement of the cord with mild distortion at the
T6-7 level, may represent ventral cord herniation. No cord signal
abnormality.

Paraspinal and other soft tissues: Small bilateral pleural effusion
with multiple nodularities in the right lung. Please refer to
dedicated chest CT of performed today.

Disc levels:

No significant disc bulge or herniation, spinal canal or neural
foraminal stenosis at any level.

MRI LUMBAR SPINE FINDINGS

Segmentation:  Standard.

Alignment: Straightening of the lumbar lordosis. Trace
anterolisthesis of L4 over L5.

Vertebrae: No fracture, evidence of discitis, or aggressive bone
lesion.

Conus medullaris and cauda equina: Conus extends to the L2 level.
Conus and cauda equina appear normal.

Paraspinal and other soft tissues: Decreased volume of the kidneys
bilaterally. Edema in the periarticular soft tissues from the left
facet joint at L5-S1 with paraspinal soft tissue edema extending on
the left side up to the L2 level.

Disc levels:

L1-2: Mild facet degenerative changes. No spinal canal or neural
foraminal stenosis.

L2-3: Mild facet degenerative changes. No significant spinal canal
or neural foraminal stenosis.

L3-4: Shallow disc bulge, mild right and moderate left facet
degenerative changes resulting in mild to moderate left neural
foraminal narrowing.

L4-5: Disc bulge, hypertrophic facet degenerative change ligamentum
flavum redundancy resulting in mild spinal canal stenosis with
narrowing of the bilateral subarticular zones and moderate bilateral
neural foraminal narrowing.

L5-S1: Disc bulge. Advanced facet degenerative changes with
bilateral joint effusion. Facet arthritic changes more pronounced on
the left where there is a posteriorly projecting 1.1 cm synovial
cyst and prominent periarticular soft tissue edema extending
superiorly in the paraspinal musculature. Severe bilateral neural
foraminal narrowing. No spinal canal stenosis.
IMPRESSION: 1. Bilateral L5-S1 facet osteoarthritis, left greater than right,
with posteriorly projecting synovial cyst and periarticular soft
tissue edema on the left. Superimposed septic arthritis cannot be
excluded.
2. Anterior displacement of the cord with mild distortion at the
T6-7 level, may represent ventral cord the. No cord signal
abnormality.
3. No evidence of discitis/osteomyelitis of the cervical, thoracic
or lumbar spine.
4. Mild degenerative changes of the cervical spine with moderate
bilateral neural foraminal narrowing at C5-6.
5. Severe bilateral neural foraminal narrowing at L5-S1 and moderate
bilateral at L4-5.

ADDENDUM:
Chest CT interpretation is provided here, as the original study was
incorrectly linked to the patient's spine MR exams done the same day
and not dictated at that time.
FINDINGS: Cardiovascular: Pulmonic trunk and heart are enlarged. No
pericardial effusion.

Mediastinum/Nodes: No pathologically enlarged mediastinal or
axillary lymph nodes. Hilar regions are difficult to definitively
evaluate without IV contrast. Esophagus is grossly unremarkable.

Lungs/Pleura: There are peripheral areas of cavitary consolidation
in the lungs bilaterally. Small bilateral pleural effusions with
compressive atelectasis in both lower lobes, left greater than
right. Debris is seen in the trachea.

Upper Abdomen: Visualized portion of the liver is unremarkable. A
small stone is seen in the gallbladder. Visualized portions of the
adrenal glands, spleen, pancreas, stomach and bowel are grossly
unremarkable.

Musculoskeletal: Degenerative changes in the spine. No worrisome
lytic or sclerotic lesions.
IMPRESSION: 1. Septic emboli.
2. Small bilateral pleural effusions.
3. Cholelithiasis.
4. Enlarged pulmonic trunk, indicative of pulmonary arterial
hypertension.

*** End of Addendum ***
Addendum:
FINDINGS: MRI CERVICAL SPINE FINDINGS

Alignment: Straightening of the cervical curvature.

Vertebrae: No fracture, evidence of discitis, or bone lesion.
Postsurgical changes from C4 through C7 ACDF.

Cord: Normal signal and morphology.

Posterior Fossa, vertebral arteries, paraspinal tissues: A 7 mm T1
hyperintense/T2 hypointense lesion within the sella. Correlation
with dedicated study suggested.

Disc levels:

C2-3: No spinal canal or neural foraminal stenosis.

C3-4: Small posterior disc protrusion without significant spinal
canal stenosis. Uncovertebral and facet degenerative changes
resulting in mild left neural foraminal narrowing.

C4-5: No spinal canal or neural foraminal stenosis.

C5-6: Uncovertebral and facet degenerative changes resulting in
moderate bilateral neural foraminal narrowing. Mild spinal canal
stenosis.

C6-7: Uncovertebral and facet degenerative changes resulting in mild
bilateral neural foraminal narrowing. Mild spinal canal stenosis.

C7-T1: Small posterior disc protrusion and facet degenerative
changes. No spinal canal or neural foraminal stenosis.

MRI THORACIC SPINE FINDINGS

Alignment:  Physiologic.

Vertebrae: No fracture, evidence of discitis, or aggressive bone
lesion.

Cord: Anterior displacement of the cord with mild distortion at the
T6-7 level, may represent ventral cord herniation. No cord signal
abnormality.

Paraspinal and other soft tissues: Small bilateral pleural effusion
with multiple nodularities in the right lung. Please refer to
dedicated chest CT of performed today.

Disc levels:

No significant disc bulge or herniation, spinal canal or neural
foraminal stenosis at any level.

MRI LUMBAR SPINE FINDINGS

Segmentation:  Standard.

Alignment: Straightening of the lumbar lordosis. Trace
anterolisthesis of L4 over L5.

Vertebrae: No fracture, evidence of discitis, or aggressive bone
lesion.

Conus medullaris and cauda equina: Conus extends to the L2 level.
Conus and cauda equina appear normal.

Paraspinal and other soft tissues: Decreased volume of the kidneys
bilaterally. Edema in the periarticular soft tissues from the left
facet joint at L5-S1 with paraspinal soft tissue edema extending on
the left side up to the L2 level.

Disc levels:

L1-2: Mild facet degenerative changes. No spinal canal or neural
foraminal stenosis.

L2-3: Mild facet degenerative changes. No significant spinal canal
or neural foraminal stenosis.

L3-4: Shallow disc bulge, mild right and moderate left facet
degenerative changes resulting in mild to moderate left neural
foraminal narrowing.

L4-5: Disc bulge, hypertrophic facet degenerative change ligamentum
flavum redundancy resulting in mild spinal canal stenosis with
narrowing of the bilateral subarticular zones and moderate bilateral
neural foraminal narrowing.

L5-S1: Disc bulge. Advanced facet degenerative changes with
bilateral joint effusion. Facet arthritic changes more pronounced on
the left where there is a posteriorly projecting 1.1 cm synovial
cyst and prominent periarticular soft tissue edema extending
superiorly in the paraspinal musculature. Severe bilateral neural
foraminal narrowing. No spinal canal stenosis.
IMPRESSION: 1. Bilateral L5-S1 facet osteoarthritis, left greater than right,
with posteriorly projecting synovial cyst and periarticular soft
tissue edema on the left. Superimposed septic arthritis cannot be
excluded.
2. Anterior displacement of the cord with mild distortion at the
T6-7 level, may represent ventral cord the. No cord signal
abnormality.
3. No evidence of discitis/osteomyelitis of the cervical, thoracic
or lumbar spine.
4. Mild degenerative changes of the cervical spine with moderate
bilateral neural foraminal narrowing at C5-6.
5. Severe bilateral neural foraminal narrowing at L5-S1 and moderate
bilateral at L4-5.

*** End of Addendum ***
FINDINGS: MRI CERVICAL SPINE FINDINGS

Alignment: Straightening of the cervical curvature.

Vertebrae: No fracture, evidence of discitis, or bone lesion.
Postsurgical changes from C4 through C7 ACDF.

Cord: Normal signal and morphology.

Posterior Fossa, vertebral arteries, paraspinal tissues: A 7 mm T1
hyperintense/T2 hypointense lesion within the sella. Correlation
with dedicated study suggested.

Disc levels:

C2-3: No spinal canal or neural foraminal stenosis.

C3-4: Small posterior disc protrusion without significant spinal
canal stenosis. Uncovertebral and facet degenerative changes
resulting in mild left neural foraminal narrowing.

C4-5: No spinal canal or neural foraminal stenosis.

C5-6: Uncovertebral and facet degenerative changes resulting in
moderate bilateral neural foraminal narrowing. Mild spinal canal
stenosis.

C6-7: Uncovertebral and facet degenerative changes resulting in mild
bilateral neural foraminal narrowing. Mild spinal canal stenosis.

C7-T1: Small posterior disc protrusion and facet degenerative
changes. No spinal canal or neural foraminal stenosis.

MRI THORACIC SPINE FINDINGS

Alignment:  Physiologic.

Vertebrae: No fracture, evidence of discitis, or aggressive bone
lesion.

Cord: Anterior displacement of the cord with mild distortion at the
T6-7 level, may represent ventral cord herniation. No cord signal
abnormality.

Paraspinal and other soft tissues: Small bilateral pleural effusion
with multiple nodularities in the right lung. Please refer to
dedicated chest CT of performed today.

Disc levels:

No significant disc bulge or herniation, spinal canal or neural
foraminal stenosis at any level.

MRI LUMBAR SPINE FINDINGS

Segmentation:  Standard.

Alignment: Straightening of the lumbar lordosis. Trace
anterolisthesis of L4 over L5.

Vertebrae: No fracture, evidence of discitis, or aggressive bone
lesion.

Conus medullaris and cauda equina: Conus extends to the L2 level.
Conus and cauda equina appear normal.

Paraspinal and other soft tissues: Decreased volume of the kidneys
bilaterally. Edema in the periarticular soft tissues from the left
facet joint at L5-S1 with paraspinal soft tissue edema extending on
the left side up to the L2 level.

Disc levels:

L1-2: Mild facet degenerative changes. No spinal canal or neural
foraminal stenosis.

L2-3: Mild facet degenerative changes. No significant spinal canal
or neural foraminal stenosis.

L3-4: Shallow disc bulge, mild right and moderate left facet
degenerative changes resulting in mild to moderate left neural
foraminal narrowing.

L4-5: Disc bulge, hypertrophic facet degenerative change ligamentum
flavum redundancy resulting in mild spinal canal stenosis with
narrowing of the bilateral subarticular zones and moderate bilateral
neural foraminal narrowing.

L5-S1: Disc bulge. Advanced facet degenerative changes with
bilateral joint effusion. Facet arthritic changes more pronounced on
the left where there is a posteriorly projecting 1.1 cm synovial
cyst and prominent periarticular soft tissue edema extending
superiorly in the paraspinal musculature. Severe bilateral neural
foraminal narrowing. No spinal canal stenosis.
IMPRESSION: 1. Bilateral L5-S1 facet osteoarthritis, left greater than right,
with posteriorly projecting synovial cyst and periarticular soft
tissue edema on the left. Superimposed septic arthritis cannot be
excluded.
2. Anterior displacement of the cord with mild distortion at the
T6-7 level, may represent ventral cord the. No cord signal
abnormality.
3. No evidence of discitis/osteomyelitis of the cervical, thoracic
or lumbar spine.
4. Mild degenerative changes of the cervical spine with moderate
bilateral neural foraminal narrowing at C5-6.
5. Severe bilateral neural foraminal narrowing at L5-S1 and moderate
bilateral at L4-5.

## 2021-01-17 IMAGING — MR MR THORACIC SPINE W/O CM
4 of 6 series · 9 of 48 positions shown · non-contrast
Comparison: None.
COMPARISON: [DATE].
COMPARISON: None.
COMPARISON: None.

Addendum:
CLINICAL DATA: Fall fall neck pain, mid back pain and lower back
pain. MSSA bacteremia. Rule out discitis/osteomyelitis.

EXAM:
MRI CERVICAL, THORACIC AND LUMBAR SPINE WITHOUT CONTRAST
TECHNIQUE: Multiplanar and multiecho pulse sequences of the cervical spine, to
include the craniocervical junction and cervicothoracic junction,
and thoracic and lumbar spine, were obtained without intravenous
contrast.
CLINICAL DATA: Chest pain during hemodialysis with nausea and
vomiting. Sepsis.
TECHNIQUE: Multi detector CT imaging of the chest was performed
without the administration of IV contrast.

[Series 28: T1 · sagittal · 3.3mm · 0.62mm/px · 2 of 9 slices shown (1 of 2)]
[im 1/9]
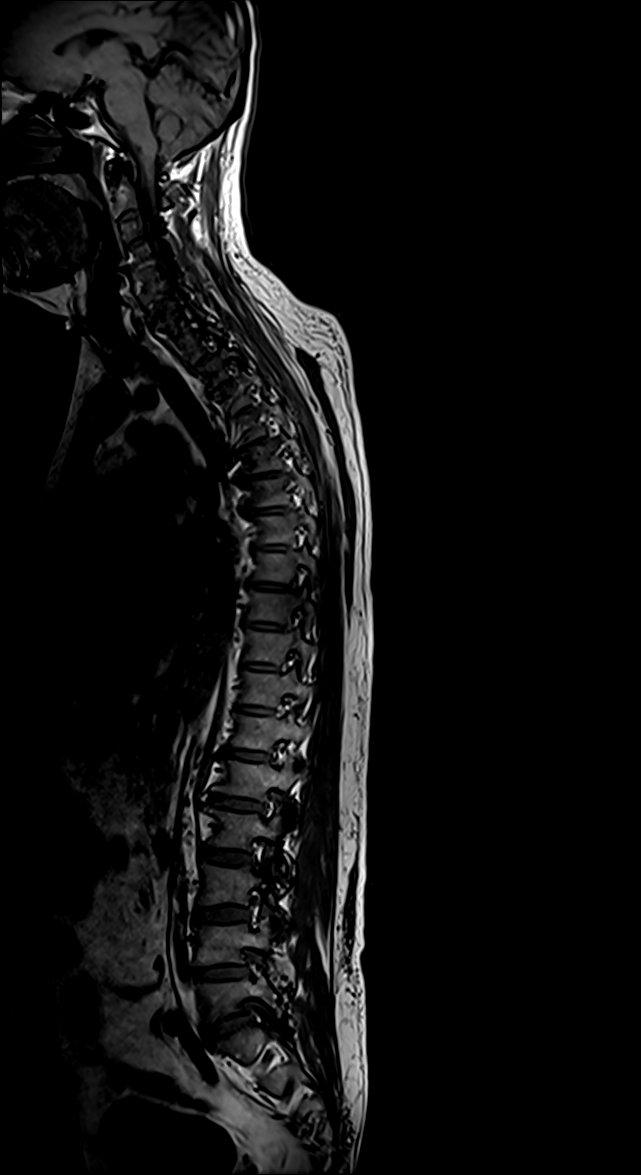
[im 9/9]
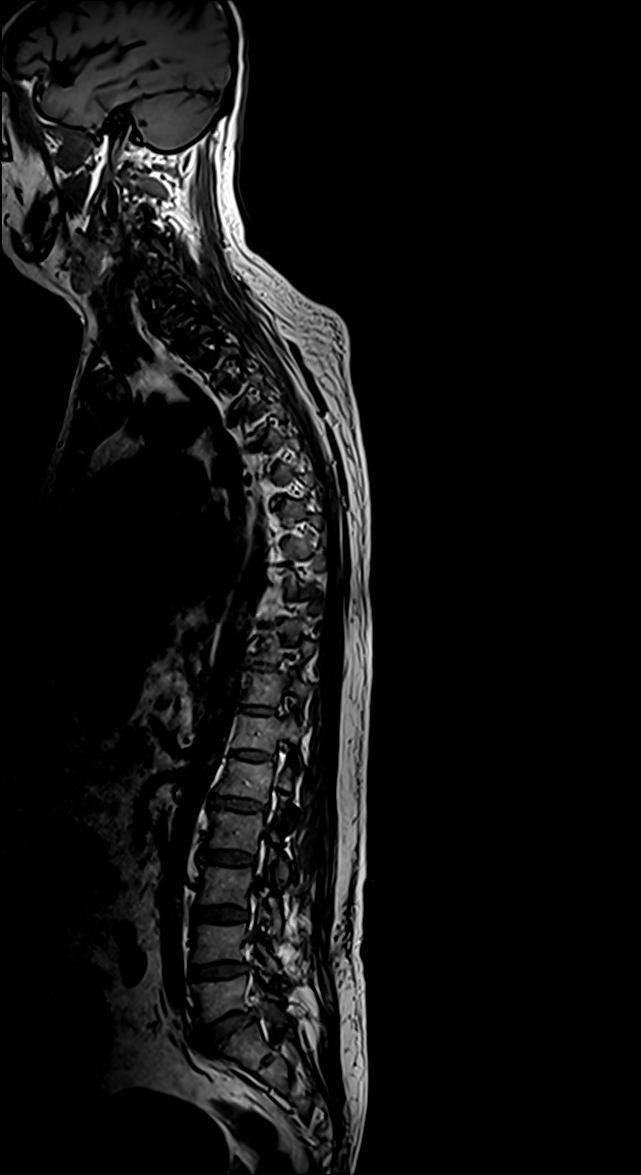

[Series 29: T2 · sagittal · 3.0mm · 0.76mm/px · 3 of 17 slices shown (1 of 2)]
[im 4/17]
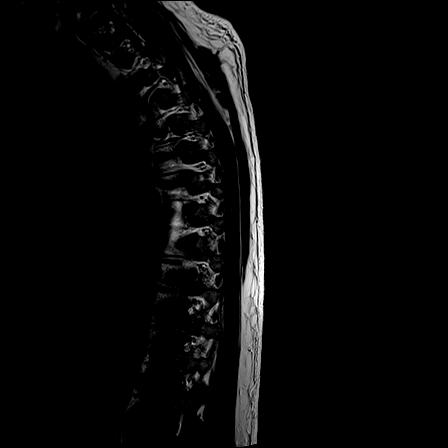
[im 10/17]
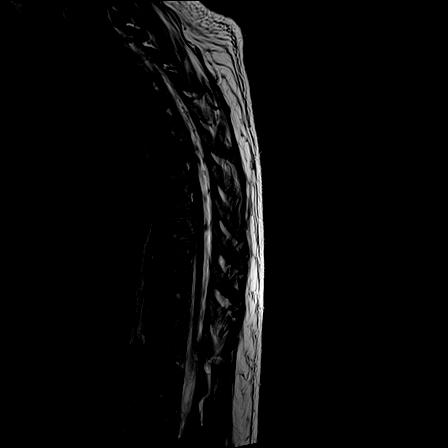
[im 17/17]
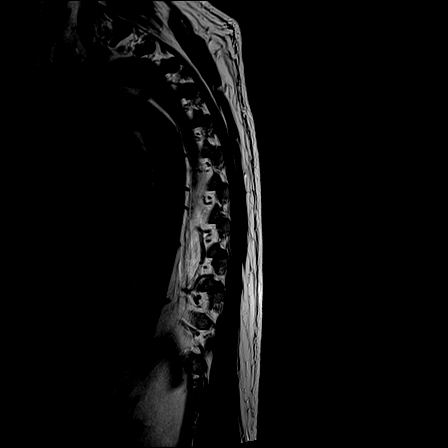

[Series 30: T1 · sagittal · 3.0mm · 0.76mm/px · 1 of 17 slices shown (2 of 2)]
[im 4/17]
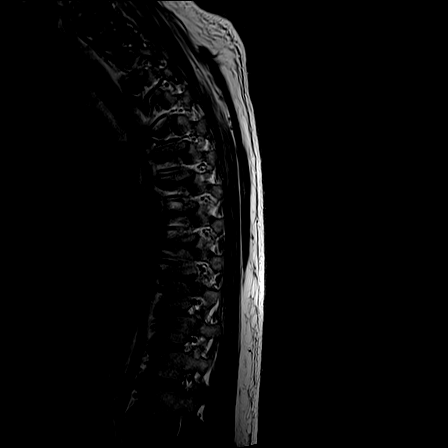

[Series 32: T2 · axial · 5.0mm · 0.59mm/px · z∈[-313,-162]mm · 3 of 39 slices shown (2 of 2)]
[im 6/39]
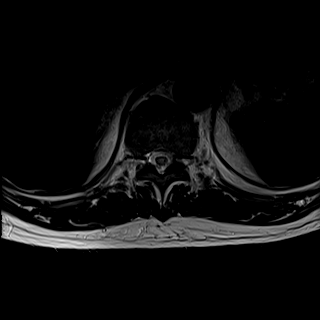
[im 21/39]
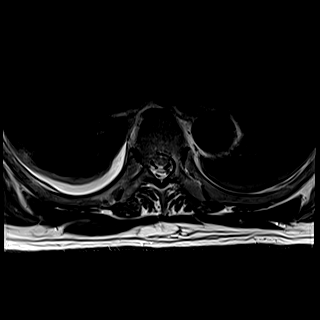
[im 33/39]
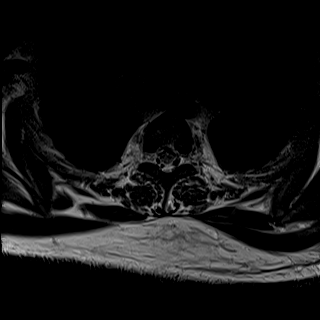

[9 of 48 positions shown; findings below may reference images not displayed]

FINDINGS: MRI CERVICAL SPINE FINDINGS

Alignment: Straightening of the cervical curvature.

Vertebrae: No fracture, evidence of discitis, or bone lesion.
Postsurgical changes from C4 through C7 ACDF.

Cord: Normal signal and morphology.

Posterior Fossa, vertebral arteries, paraspinal tissues: A 7 mm T1
hyperintense/T2 hypointense lesion within the sella. Correlation
with dedicated study suggested.

Disc levels:

C2-3: No spinal canal or neural foraminal stenosis.

C3-4: Small posterior disc protrusion without significant spinal
canal stenosis. Uncovertebral and facet degenerative changes
resulting in mild left neural foraminal narrowing.

C4-5: No spinal canal or neural foraminal stenosis.

C5-6: Uncovertebral and facet degenerative changes resulting in
moderate bilateral neural foraminal narrowing. Mild spinal canal
stenosis.

C6-7: Uncovertebral and facet degenerative changes resulting in mild
bilateral neural foraminal narrowing. Mild spinal canal stenosis.

C7-T1: Small posterior disc protrusion and facet degenerative
changes. No spinal canal or neural foraminal stenosis.

MRI THORACIC SPINE FINDINGS

Alignment:  Physiologic.

Vertebrae: No fracture, evidence of discitis, or aggressive bone
lesion.

Cord: Anterior displacement of the cord with mild distortion at the
T6-7 level, may represent ventral cord herniation. No cord signal
abnormality.

Paraspinal and other soft tissues: Small bilateral pleural effusion
with multiple nodularities in the right lung. Please refer to
dedicated chest CT of performed today.

Disc levels:

No significant disc bulge or herniation, spinal canal or neural
foraminal stenosis at any level.

MRI LUMBAR SPINE FINDINGS

Segmentation:  Standard.

Alignment: Straightening of the lumbar lordosis. Trace
anterolisthesis of L4 over L5.

Vertebrae: No fracture, evidence of discitis, or aggressive bone
lesion.

Conus medullaris and cauda equina: Conus extends to the L2 level.
Conus and cauda equina appear normal.

Paraspinal and other soft tissues: Decreased volume of the kidneys
bilaterally. Edema in the periarticular soft tissues from the left
facet joint at L5-S1 with paraspinal soft tissue edema extending on
the left side up to the L2 level.

Disc levels:

L1-2: Mild facet degenerative changes. No spinal canal or neural
foraminal stenosis.

L2-3: Mild facet degenerative changes. No significant spinal canal
or neural foraminal stenosis.

L3-4: Shallow disc bulge, mild right and moderate left facet
degenerative changes resulting in mild to moderate left neural
foraminal narrowing.

L4-5: Disc bulge, hypertrophic facet degenerative change ligamentum
flavum redundancy resulting in mild spinal canal stenosis with
narrowing of the bilateral subarticular zones and moderate bilateral
neural foraminal narrowing.

L5-S1: Disc bulge. Advanced facet degenerative changes with
bilateral joint effusion. Facet arthritic changes more pronounced on
the left where there is a posteriorly projecting 1.1 cm synovial
cyst and prominent periarticular soft tissue edema extending
superiorly in the paraspinal musculature. Severe bilateral neural
foraminal narrowing. No spinal canal stenosis.
IMPRESSION: 1. Bilateral L5-S1 facet osteoarthritis, left greater than right,
with posteriorly projecting synovial cyst and periarticular soft
tissue edema on the left. Superimposed septic arthritis cannot be
excluded.
2. Anterior displacement of the cord with mild distortion at the
T6-7 level, may represent ventral cord the. No cord signal
abnormality.
3. No evidence of discitis/osteomyelitis of the cervical, thoracic
or lumbar spine.
4. Mild degenerative changes of the cervical spine with moderate
bilateral neural foraminal narrowing at C5-6.
5. Severe bilateral neural foraminal narrowing at L5-S1 and moderate
bilateral at L4-5.

ADDENDUM:
Chest CT interpretation is provided here, as the original study was
incorrectly linked to the patient's spine MR exams done the same day
and not dictated at that time.
FINDINGS: Cardiovascular: Pulmonic trunk and heart are enlarged. No
pericardial effusion.

Mediastinum/Nodes: No pathologically enlarged mediastinal or
axillary lymph nodes. Hilar regions are difficult to definitively
evaluate without IV contrast. Esophagus is grossly unremarkable.

Lungs/Pleura: There are peripheral areas of cavitary consolidation
in the lungs bilaterally. Small bilateral pleural effusions with
compressive atelectasis in both lower lobes, left greater than
right. Debris is seen in the trachea.

Upper Abdomen: Visualized portion of the liver is unremarkable. A
small stone is seen in the gallbladder. Visualized portions of the
adrenal glands, spleen, pancreas, stomach and bowel are grossly
unremarkable.

Musculoskeletal: Degenerative changes in the spine. No worrisome
lytic or sclerotic lesions.
IMPRESSION: 1. Septic emboli.
2. Small bilateral pleural effusions.
3. Cholelithiasis.
4. Enlarged pulmonic trunk, indicative of pulmonary arterial
hypertension.

*** End of Addendum ***
Addendum:
FINDINGS: MRI CERVICAL SPINE FINDINGS

Alignment: Straightening of the cervical curvature.

Vertebrae: No fracture, evidence of discitis, or bone lesion.
Postsurgical changes from C4 through C7 ACDF.

Cord: Normal signal and morphology.

Posterior Fossa, vertebral arteries, paraspinal tissues: A 7 mm T1
hyperintense/T2 hypointense lesion within the sella. Correlation
with dedicated study suggested.

Disc levels:

C2-3: No spinal canal or neural foraminal stenosis.

C3-4: Small posterior disc protrusion without significant spinal
canal stenosis. Uncovertebral and facet degenerative changes
resulting in mild left neural foraminal narrowing.

C4-5: No spinal canal or neural foraminal stenosis.

C5-6: Uncovertebral and facet degenerative changes resulting in
moderate bilateral neural foraminal narrowing. Mild spinal canal
stenosis.

C6-7: Uncovertebral and facet degenerative changes resulting in mild
bilateral neural foraminal narrowing. Mild spinal canal stenosis.

C7-T1: Small posterior disc protrusion and facet degenerative
changes. No spinal canal or neural foraminal stenosis.

MRI THORACIC SPINE FINDINGS

Alignment:  Physiologic.

Vertebrae: No fracture, evidence of discitis, or aggressive bone
lesion.

Cord: Anterior displacement of the cord with mild distortion at the
T6-7 level, may represent ventral cord herniation. No cord signal
abnormality.

Paraspinal and other soft tissues: Small bilateral pleural effusion
with multiple nodularities in the right lung. Please refer to
dedicated chest CT of performed today.

Disc levels:

No significant disc bulge or herniation, spinal canal or neural
foraminal stenosis at any level.

MRI LUMBAR SPINE FINDINGS

Segmentation:  Standard.

Alignment: Straightening of the lumbar lordosis. Trace
anterolisthesis of L4 over L5.

Vertebrae: No fracture, evidence of discitis, or aggressive bone
lesion.

Conus medullaris and cauda equina: Conus extends to the L2 level.
Conus and cauda equina appear normal.

Paraspinal and other soft tissues: Decreased volume of the kidneys
bilaterally. Edema in the periarticular soft tissues from the left
facet joint at L5-S1 with paraspinal soft tissue edema extending on
the left side up to the L2 level.

Disc levels:

L1-2: Mild facet degenerative changes. No spinal canal or neural
foraminal stenosis.

L2-3: Mild facet degenerative changes. No significant spinal canal
or neural foraminal stenosis.

L3-4: Shallow disc bulge, mild right and moderate left facet
degenerative changes resulting in mild to moderate left neural
foraminal narrowing.

L4-5: Disc bulge, hypertrophic facet degenerative change ligamentum
flavum redundancy resulting in mild spinal canal stenosis with
narrowing of the bilateral subarticular zones and moderate bilateral
neural foraminal narrowing.

L5-S1: Disc bulge. Advanced facet degenerative changes with
bilateral joint effusion. Facet arthritic changes more pronounced on
the left where there is a posteriorly projecting 1.1 cm synovial
cyst and prominent periarticular soft tissue edema extending
superiorly in the paraspinal musculature. Severe bilateral neural
foraminal narrowing. No spinal canal stenosis.
IMPRESSION: 1. Bilateral L5-S1 facet osteoarthritis, left greater than right,
with posteriorly projecting synovial cyst and periarticular soft
tissue edema on the left. Superimposed septic arthritis cannot be
excluded.
2. Anterior displacement of the cord with mild distortion at the
T6-7 level, may represent ventral cord the. No cord signal
abnormality.
3. No evidence of discitis/osteomyelitis of the cervical, thoracic
or lumbar spine.
4. Mild degenerative changes of the cervical spine with moderate
bilateral neural foraminal narrowing at C5-6.
5. Severe bilateral neural foraminal narrowing at L5-S1 and moderate
bilateral at L4-5.

*** End of Addendum ***
FINDINGS: MRI CERVICAL SPINE FINDINGS

Alignment: Straightening of the cervical curvature.

Vertebrae: No fracture, evidence of discitis, or bone lesion.
Postsurgical changes from C4 through C7 ACDF.

Cord: Normal signal and morphology.

Posterior Fossa, vertebral arteries, paraspinal tissues: A 7 mm T1
hyperintense/T2 hypointense lesion within the sella. Correlation
with dedicated study suggested.

Disc levels:

C2-3: No spinal canal or neural foraminal stenosis.

C3-4: Small posterior disc protrusion without significant spinal
canal stenosis. Uncovertebral and facet degenerative changes
resulting in mild left neural foraminal narrowing.

C4-5: No spinal canal or neural foraminal stenosis.

C5-6: Uncovertebral and facet degenerative changes resulting in
moderate bilateral neural foraminal narrowing. Mild spinal canal
stenosis.

C6-7: Uncovertebral and facet degenerative changes resulting in mild
bilateral neural foraminal narrowing. Mild spinal canal stenosis.

C7-T1: Small posterior disc protrusion and facet degenerative
changes. No spinal canal or neural foraminal stenosis.

MRI THORACIC SPINE FINDINGS

Alignment:  Physiologic.

Vertebrae: No fracture, evidence of discitis, or aggressive bone
lesion.

Cord: Anterior displacement of the cord with mild distortion at the
T6-7 level, may represent ventral cord herniation. No cord signal
abnormality.

Paraspinal and other soft tissues: Small bilateral pleural effusion
with multiple nodularities in the right lung. Please refer to
dedicated chest CT of performed today.

Disc levels:

No significant disc bulge or herniation, spinal canal or neural
foraminal stenosis at any level.

MRI LUMBAR SPINE FINDINGS

Segmentation:  Standard.

Alignment: Straightening of the lumbar lordosis. Trace
anterolisthesis of L4 over L5.

Vertebrae: No fracture, evidence of discitis, or aggressive bone
lesion.

Conus medullaris and cauda equina: Conus extends to the L2 level.
Conus and cauda equina appear normal.

Paraspinal and other soft tissues: Decreased volume of the kidneys
bilaterally. Edema in the periarticular soft tissues from the left
facet joint at L5-S1 with paraspinal soft tissue edema extending on
the left side up to the L2 level.

Disc levels:

L1-2: Mild facet degenerative changes. No spinal canal or neural
foraminal stenosis.

L2-3: Mild facet degenerative changes. No significant spinal canal
or neural foraminal stenosis.

L3-4: Shallow disc bulge, mild right and moderate left facet
degenerative changes resulting in mild to moderate left neural
foraminal narrowing.

L4-5: Disc bulge, hypertrophic facet degenerative change ligamentum
flavum redundancy resulting in mild spinal canal stenosis with
narrowing of the bilateral subarticular zones and moderate bilateral
neural foraminal narrowing.

L5-S1: Disc bulge. Advanced facet degenerative changes with
bilateral joint effusion. Facet arthritic changes more pronounced on
the left where there is a posteriorly projecting 1.1 cm synovial
cyst and prominent periarticular soft tissue edema extending
superiorly in the paraspinal musculature. Severe bilateral neural
foraminal narrowing. No spinal canal stenosis.
IMPRESSION: 1. Bilateral L5-S1 facet osteoarthritis, left greater than right,
with posteriorly projecting synovial cyst and periarticular soft
tissue edema on the left. Superimposed septic arthritis cannot be
excluded.
2. Anterior displacement of the cord with mild distortion at the
T6-7 level, may represent ventral cord the. No cord signal
abnormality.
3. No evidence of discitis/osteomyelitis of the cervical, thoracic
or lumbar spine.
4. Mild degenerative changes of the cervical spine with moderate
bilateral neural foraminal narrowing at C5-6.
5. Severe bilateral neural foraminal narrowing at L5-S1 and moderate
bilateral at L4-5.

## 2021-01-17 IMAGING — MR MR LUMBAR SPINE W/O CM
4 of 5 series · 13 of 48 positions shown · non-contrast
Comparison: None.
COMPARISON: [DATE].
COMPARISON: None.
COMPARISON: None.

Addendum:
CLINICAL DATA: Fall fall neck pain, mid back pain and lower back
pain. MSSA bacteremia. Rule out discitis/osteomyelitis.

EXAM:
MRI CERVICAL, THORACIC AND LUMBAR SPINE WITHOUT CONTRAST
TECHNIQUE: Multiplanar and multiecho pulse sequences of the cervical spine, to
include the craniocervical junction and cervicothoracic junction,
and thoracic and lumbar spine, were obtained without intravenous
contrast.
CLINICAL DATA: Chest pain during hemodialysis with nausea and
vomiting. Sepsis.
TECHNIQUE: Multi detector CT imaging of the chest was performed
without the administration of IV contrast.

[Series 1: T2 · sagittal · 4.0mm · 0.73mm/px · 4 of 16 slices shown (1 of 2)]
[im 1/16]
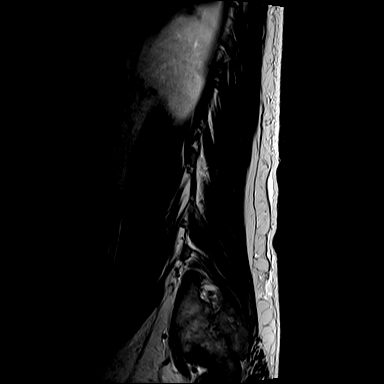
[im 4/16]
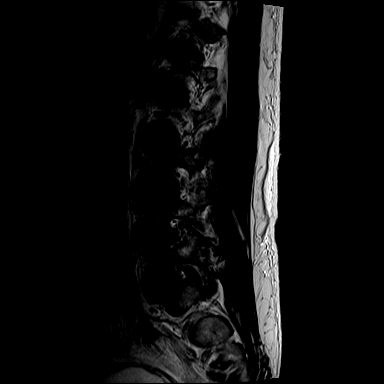
[im 10/16]
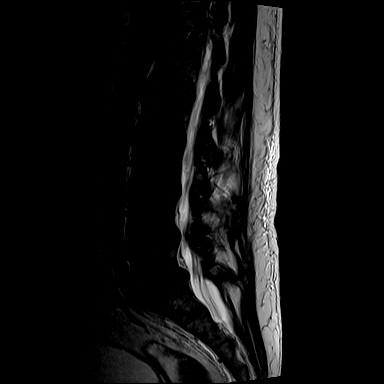
[im 16/16]
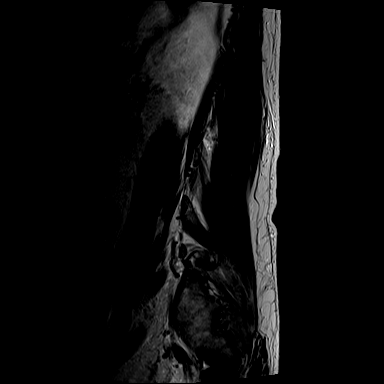

[Series 3: T1 · sagittal · 4.0mm · 0.88mm/px · 3 of 16 slices shown (1 of 2)]
[im 3/16]
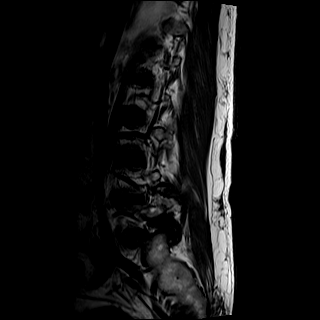
[im 8/16]
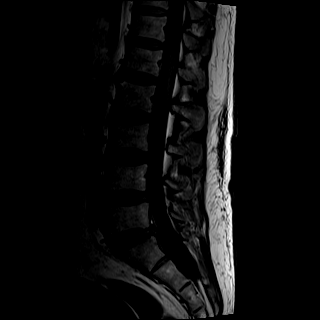
[im 13/16]
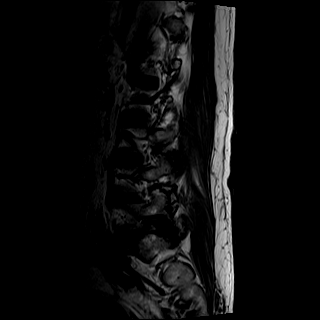

[Series 4: T2 · axial · 5.0mm · 0.57mm/px · z∈[-518,-345]mm · 3 of 31 slices shown (2 of 2)]
[im 5/31]
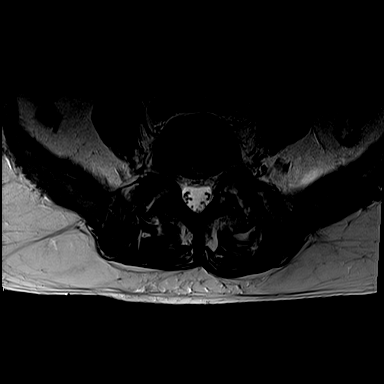
[im 17/31]
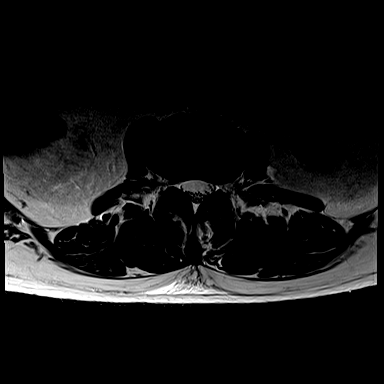
[im 26/31]
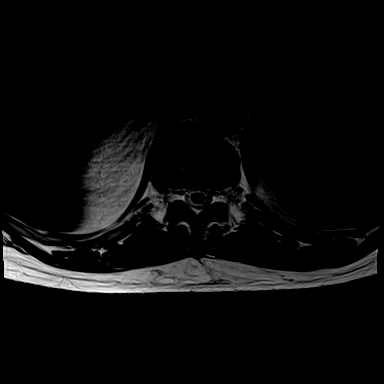

[Series 5: T1 · axial · 5.0mm · 0.34mm/px · z∈[-518,-345]mm · 3 of 31 slices shown (2 of 2)]
[im 5/31]
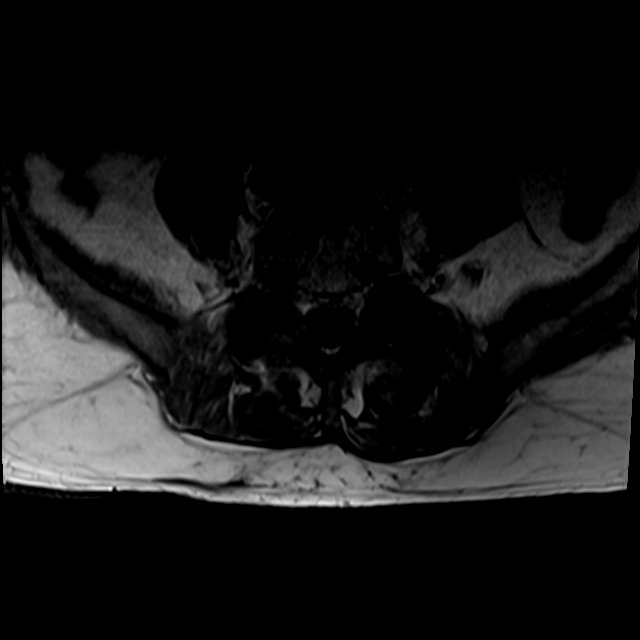
[im 17/31]
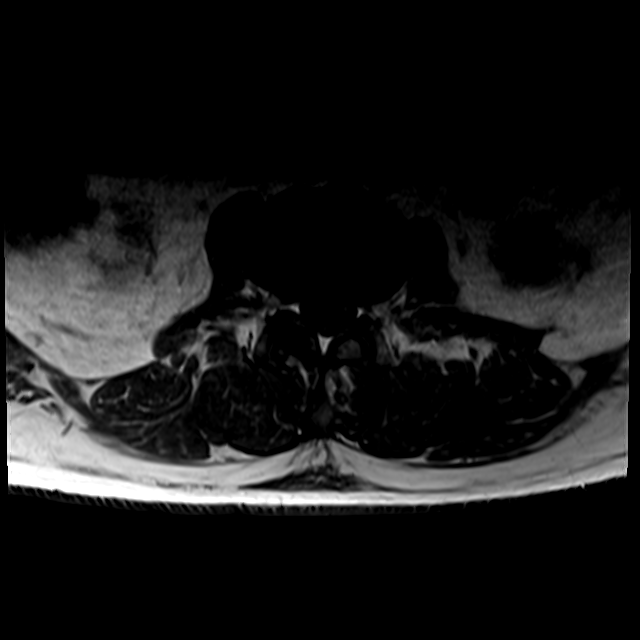
[im 26/31]
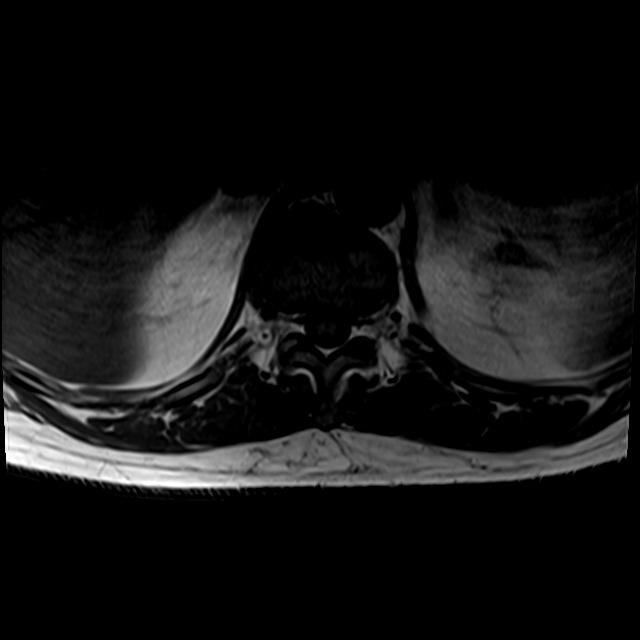

[13 of 48 positions shown; findings below may reference images not displayed]

FINDINGS: MRI CERVICAL SPINE FINDINGS

Alignment: Straightening of the cervical curvature.

Vertebrae: No fracture, evidence of discitis, or bone lesion.
Postsurgical changes from C4 through C7 ACDF.

Cord: Normal signal and morphology.

Posterior Fossa, vertebral arteries, paraspinal tissues: A 7 mm T1
hyperintense/T2 hypointense lesion within the sella. Correlation
with dedicated study suggested.

Disc levels:

C2-3: No spinal canal or neural foraminal stenosis.

C3-4: Small posterior disc protrusion without significant spinal
canal stenosis. Uncovertebral and facet degenerative changes
resulting in mild left neural foraminal narrowing.

C4-5: No spinal canal or neural foraminal stenosis.

C5-6: Uncovertebral and facet degenerative changes resulting in
moderate bilateral neural foraminal narrowing. Mild spinal canal
stenosis.

C6-7: Uncovertebral and facet degenerative changes resulting in mild
bilateral neural foraminal narrowing. Mild spinal canal stenosis.

C7-T1: Small posterior disc protrusion and facet degenerative
changes. No spinal canal or neural foraminal stenosis.

MRI THORACIC SPINE FINDINGS

Alignment:  Physiologic.

Vertebrae: No fracture, evidence of discitis, or aggressive bone
lesion.

Cord: Anterior displacement of the cord with mild distortion at the
T6-7 level, may represent ventral cord herniation. No cord signal
abnormality.

Paraspinal and other soft tissues: Small bilateral pleural effusion
with multiple nodularities in the right lung. Please refer to
dedicated chest CT of performed today.

Disc levels:

No significant disc bulge or herniation, spinal canal or neural
foraminal stenosis at any level.

MRI LUMBAR SPINE FINDINGS

Segmentation:  Standard.

Alignment: Straightening of the lumbar lordosis. Trace
anterolisthesis of L4 over L5.

Vertebrae: No fracture, evidence of discitis, or aggressive bone
lesion.

Conus medullaris and cauda equina: Conus extends to the L2 level.
Conus and cauda equina appear normal.

Paraspinal and other soft tissues: Decreased volume of the kidneys
bilaterally. Edema in the periarticular soft tissues from the left
facet joint at L5-S1 with paraspinal soft tissue edema extending on
the left side up to the L2 level.

Disc levels:

L1-2: Mild facet degenerative changes. No spinal canal or neural
foraminal stenosis.

L2-3: Mild facet degenerative changes. No significant spinal canal
or neural foraminal stenosis.

L3-4: Shallow disc bulge, mild right and moderate left facet
degenerative changes resulting in mild to moderate left neural
foraminal narrowing.

L4-5: Disc bulge, hypertrophic facet degenerative change ligamentum
flavum redundancy resulting in mild spinal canal stenosis with
narrowing of the bilateral subarticular zones and moderate bilateral
neural foraminal narrowing.

L5-S1: Disc bulge. Advanced facet degenerative changes with
bilateral joint effusion. Facet arthritic changes more pronounced on
the left where there is a posteriorly projecting 1.1 cm synovial
cyst and prominent periarticular soft tissue edema extending
superiorly in the paraspinal musculature. Severe bilateral neural
foraminal narrowing. No spinal canal stenosis.
IMPRESSION: 1. Bilateral L5-S1 facet osteoarthritis, left greater than right,
with posteriorly projecting synovial cyst and periarticular soft
tissue edema on the left. Superimposed septic arthritis cannot be
excluded.
2. Anterior displacement of the cord with mild distortion at the
T6-7 level, may represent ventral cord the. No cord signal
abnormality.
3. No evidence of discitis/osteomyelitis of the cervical, thoracic
or lumbar spine.
4. Mild degenerative changes of the cervical spine with moderate
bilateral neural foraminal narrowing at C5-6.
5. Severe bilateral neural foraminal narrowing at L5-S1 and moderate
bilateral at L4-5.

ADDENDUM:
Chest CT interpretation is provided here, as the original study was
incorrectly linked to the patient's spine MR exams done the same day
and not dictated at that time.
FINDINGS: Cardiovascular: Pulmonic trunk and heart are enlarged. No
pericardial effusion.

Mediastinum/Nodes: No pathologically enlarged mediastinal or
axillary lymph nodes. Hilar regions are difficult to definitively
evaluate without IV contrast. Esophagus is grossly unremarkable.

Lungs/Pleura: There are peripheral areas of cavitary consolidation
in the lungs bilaterally. Small bilateral pleural effusions with
compressive atelectasis in both lower lobes, left greater than
right. Debris is seen in the trachea.

Upper Abdomen: Visualized portion of the liver is unremarkable. A
small stone is seen in the gallbladder. Visualized portions of the
adrenal glands, spleen, pancreas, stomach and bowel are grossly
unremarkable.

Musculoskeletal: Degenerative changes in the spine. No worrisome
lytic or sclerotic lesions.
IMPRESSION: 1. Septic emboli.
2. Small bilateral pleural effusions.
3. Cholelithiasis.
4. Enlarged pulmonic trunk, indicative of pulmonary arterial
hypertension.

*** End of Addendum ***
Addendum:
FINDINGS: MRI CERVICAL SPINE FINDINGS

Alignment: Straightening of the cervical curvature.

Vertebrae: No fracture, evidence of discitis, or bone lesion.
Postsurgical changes from C4 through C7 ACDF.

Cord: Normal signal and morphology.

Posterior Fossa, vertebral arteries, paraspinal tissues: A 7 mm T1
hyperintense/T2 hypointense lesion within the sella. Correlation
with dedicated study suggested.

Disc levels:

C2-3: No spinal canal or neural foraminal stenosis.

C3-4: Small posterior disc protrusion without significant spinal
canal stenosis. Uncovertebral and facet degenerative changes
resulting in mild left neural foraminal narrowing.

C4-5: No spinal canal or neural foraminal stenosis.

C5-6: Uncovertebral and facet degenerative changes resulting in
moderate bilateral neural foraminal narrowing. Mild spinal canal
stenosis.

C6-7: Uncovertebral and facet degenerative changes resulting in mild
bilateral neural foraminal narrowing. Mild spinal canal stenosis.

C7-T1: Small posterior disc protrusion and facet degenerative
changes. No spinal canal or neural foraminal stenosis.

MRI THORACIC SPINE FINDINGS

Alignment:  Physiologic.

Vertebrae: No fracture, evidence of discitis, or aggressive bone
lesion.

Cord: Anterior displacement of the cord with mild distortion at the
T6-7 level, may represent ventral cord herniation. No cord signal
abnormality.

Paraspinal and other soft tissues: Small bilateral pleural effusion
with multiple nodularities in the right lung. Please refer to
dedicated chest CT of performed today.

Disc levels:

No significant disc bulge or herniation, spinal canal or neural
foraminal stenosis at any level.

MRI LUMBAR SPINE FINDINGS

Segmentation:  Standard.

Alignment: Straightening of the lumbar lordosis. Trace
anterolisthesis of L4 over L5.

Vertebrae: No fracture, evidence of discitis, or aggressive bone
lesion.

Conus medullaris and cauda equina: Conus extends to the L2 level.
Conus and cauda equina appear normal.

Paraspinal and other soft tissues: Decreased volume of the kidneys
bilaterally. Edema in the periarticular soft tissues from the left
facet joint at L5-S1 with paraspinal soft tissue edema extending on
the left side up to the L2 level.

Disc levels:

L1-2: Mild facet degenerative changes. No spinal canal or neural
foraminal stenosis.

L2-3: Mild facet degenerative changes. No significant spinal canal
or neural foraminal stenosis.

L3-4: Shallow disc bulge, mild right and moderate left facet
degenerative changes resulting in mild to moderate left neural
foraminal narrowing.

L4-5: Disc bulge, hypertrophic facet degenerative change ligamentum
flavum redundancy resulting in mild spinal canal stenosis with
narrowing of the bilateral subarticular zones and moderate bilateral
neural foraminal narrowing.

L5-S1: Disc bulge. Advanced facet degenerative changes with
bilateral joint effusion. Facet arthritic changes more pronounced on
the left where there is a posteriorly projecting 1.1 cm synovial
cyst and prominent periarticular soft tissue edema extending
superiorly in the paraspinal musculature. Severe bilateral neural
foraminal narrowing. No spinal canal stenosis.
IMPRESSION: 1. Bilateral L5-S1 facet osteoarthritis, left greater than right,
with posteriorly projecting synovial cyst and periarticular soft
tissue edema on the left. Superimposed septic arthritis cannot be
excluded.
2. Anterior displacement of the cord with mild distortion at the
T6-7 level, may represent ventral cord the. No cord signal
abnormality.
3. No evidence of discitis/osteomyelitis of the cervical, thoracic
or lumbar spine.
4. Mild degenerative changes of the cervical spine with moderate
bilateral neural foraminal narrowing at C5-6.
5. Severe bilateral neural foraminal narrowing at L5-S1 and moderate
bilateral at L4-5.

*** End of Addendum ***
FINDINGS: MRI CERVICAL SPINE FINDINGS

Alignment: Straightening of the cervical curvature.

Vertebrae: No fracture, evidence of discitis, or bone lesion.
Postsurgical changes from C4 through C7 ACDF.

Cord: Normal signal and morphology.

Posterior Fossa, vertebral arteries, paraspinal tissues: A 7 mm T1
hyperintense/T2 hypointense lesion within the sella. Correlation
with dedicated study suggested.

Disc levels:

C2-3: No spinal canal or neural foraminal stenosis.

C3-4: Small posterior disc protrusion without significant spinal
canal stenosis. Uncovertebral and facet degenerative changes
resulting in mild left neural foraminal narrowing.

C4-5: No spinal canal or neural foraminal stenosis.

C5-6: Uncovertebral and facet degenerative changes resulting in
moderate bilateral neural foraminal narrowing. Mild spinal canal
stenosis.

C6-7: Uncovertebral and facet degenerative changes resulting in mild
bilateral neural foraminal narrowing. Mild spinal canal stenosis.

C7-T1: Small posterior disc protrusion and facet degenerative
changes. No spinal canal or neural foraminal stenosis.

MRI THORACIC SPINE FINDINGS

Alignment:  Physiologic.

Vertebrae: No fracture, evidence of discitis, or aggressive bone
lesion.

Cord: Anterior displacement of the cord with mild distortion at the
T6-7 level, may represent ventral cord herniation. No cord signal
abnormality.

Paraspinal and other soft tissues: Small bilateral pleural effusion
with multiple nodularities in the right lung. Please refer to
dedicated chest CT of performed today.

Disc levels:

No significant disc bulge or herniation, spinal canal or neural
foraminal stenosis at any level.

MRI LUMBAR SPINE FINDINGS

Segmentation:  Standard.

Alignment: Straightening of the lumbar lordosis. Trace
anterolisthesis of L4 over L5.

Vertebrae: No fracture, evidence of discitis, or aggressive bone
lesion.

Conus medullaris and cauda equina: Conus extends to the L2 level.
Conus and cauda equina appear normal.

Paraspinal and other soft tissues: Decreased volume of the kidneys
bilaterally. Edema in the periarticular soft tissues from the left
facet joint at L5-S1 with paraspinal soft tissue edema extending on
the left side up to the L2 level.

Disc levels:

L1-2: Mild facet degenerative changes. No spinal canal or neural
foraminal stenosis.

L2-3: Mild facet degenerative changes. No significant spinal canal
or neural foraminal stenosis.

L3-4: Shallow disc bulge, mild right and moderate left facet
degenerative changes resulting in mild to moderate left neural
foraminal narrowing.

L4-5: Disc bulge, hypertrophic facet degenerative change ligamentum
flavum redundancy resulting in mild spinal canal stenosis with
narrowing of the bilateral subarticular zones and moderate bilateral
neural foraminal narrowing.

L5-S1: Disc bulge. Advanced facet degenerative changes with
bilateral joint effusion. Facet arthritic changes more pronounced on
the left where there is a posteriorly projecting 1.1 cm synovial
cyst and prominent periarticular soft tissue edema extending
superiorly in the paraspinal musculature. Severe bilateral neural
foraminal narrowing. No spinal canal stenosis.
IMPRESSION: 1. Bilateral L5-S1 facet osteoarthritis, left greater than right,
with posteriorly projecting synovial cyst and periarticular soft
tissue edema on the left. Superimposed septic arthritis cannot be
excluded.
2. Anterior displacement of the cord with mild distortion at the
T6-7 level, may represent ventral cord the. No cord signal
abnormality.
3. No evidence of discitis/osteomyelitis of the cervical, thoracic
or lumbar spine.
4. Mild degenerative changes of the cervical spine with moderate
bilateral neural foraminal narrowing at C5-6.
5. Severe bilateral neural foraminal narrowing at L5-S1 and moderate
bilateral at L4-5.

## 2021-01-17 IMAGING — CT CT CHEST W/O CM
2 of 3 series · 7 of 36 positions shown, 8 images · non-contrast
Comparison: None.
COMPARISON: [DATE].
COMPARISON: None.
COMPARISON: None.

Addendum:
CLINICAL DATA: Fall fall neck pain, mid back pain and lower back
pain. MSSA bacteremia. Rule out discitis/osteomyelitis.

EXAM:
MRI CERVICAL, THORACIC AND LUMBAR SPINE WITHOUT CONTRAST
TECHNIQUE: Multiplanar and multiecho pulse sequences of the cervical spine, to
include the craniocervical junction and cervicothoracic junction,
and thoracic and lumbar spine, were obtained without intravenous
contrast.
CLINICAL DATA: Chest pain during hemodialysis with nausea and
vomiting. Sepsis.
TECHNIQUE: Multi detector CT imaging of the chest was performed
without the administration of IV contrast.

[Series 3: chest w/o 2mm st · axial · non-contrast · 0.69mm/px · z∈[+1133,+1331]mm · 4 of 143 slices shown, 5 images]
[im 22/143  mediastinal]
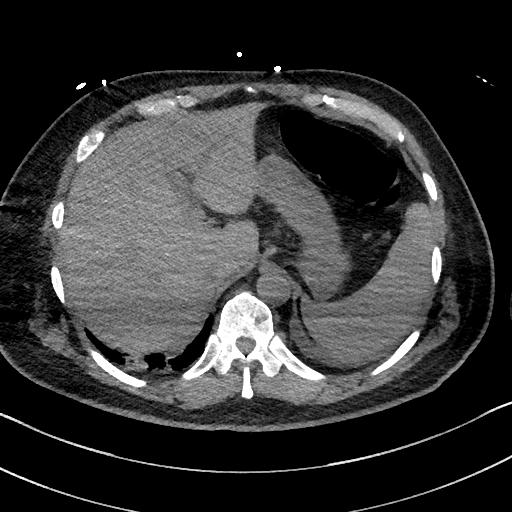
[im 22/143  lung]
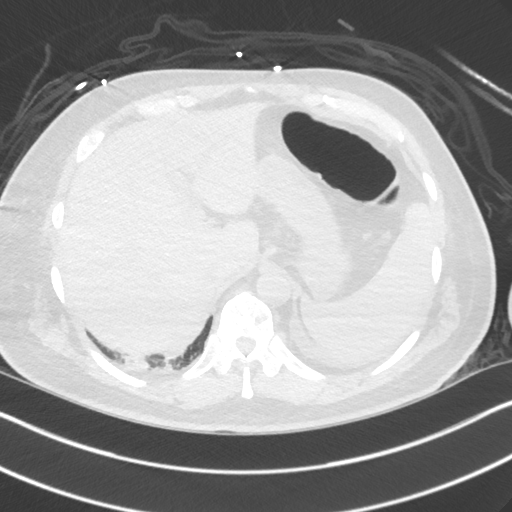
[im 53/143  lung]
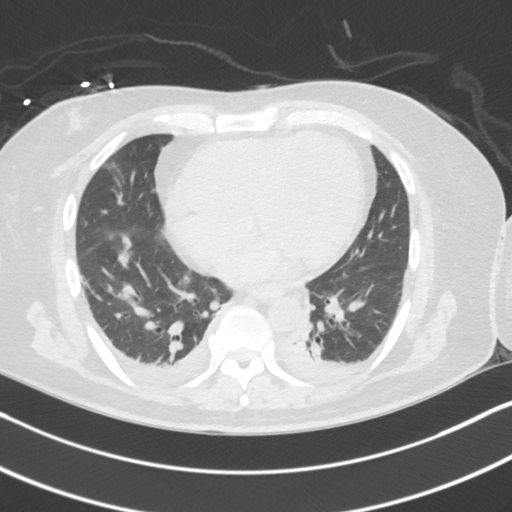
[im 90/143  lung]
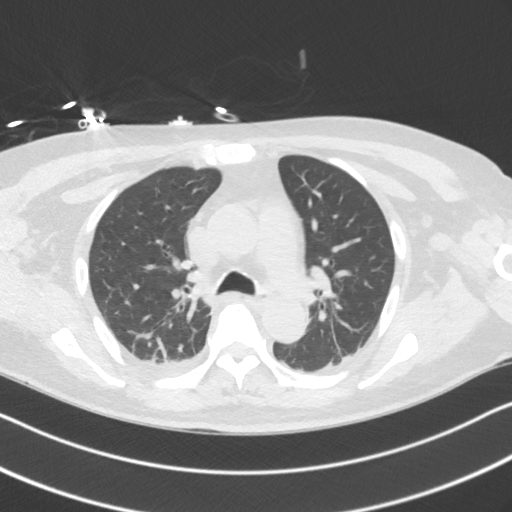
[im 121/143  lung]
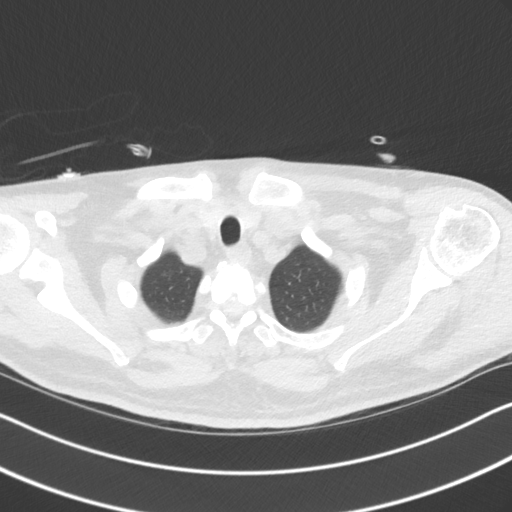

[Series 6: chest w/o 2mm st cor · coronal · non-contrast · 0.55mm/px · 3 of 127 slices shown]
[im 26/127  lung]
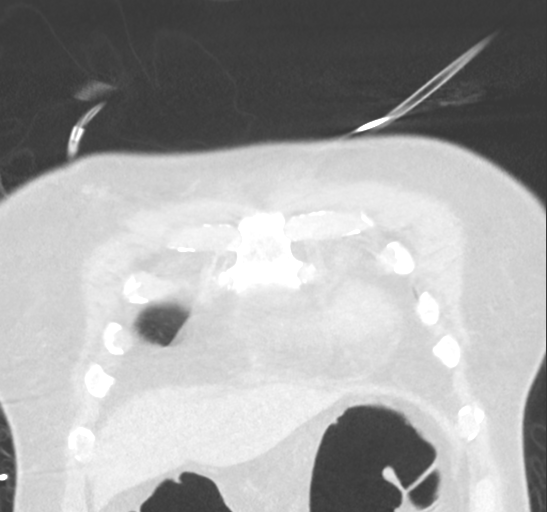
[im 51/127  lung]
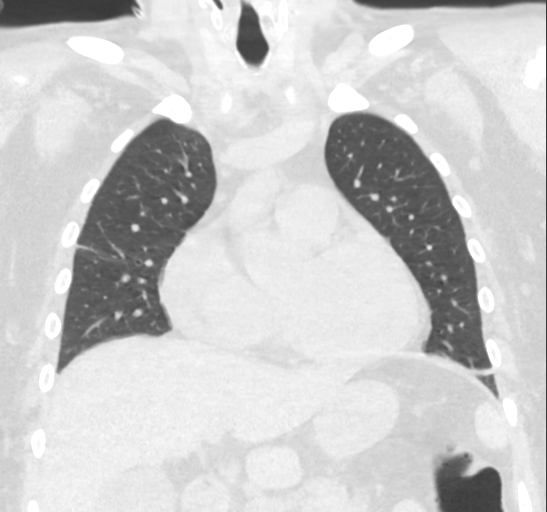
[im 76/127  lung]
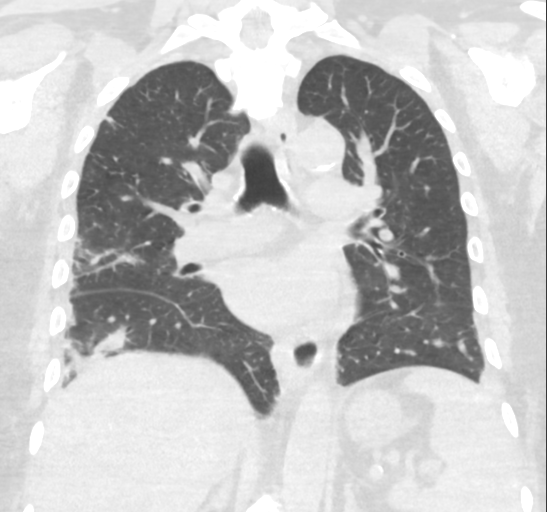

[7 of 36 positions shown; findings below may reference images not displayed]

FINDINGS: MRI CERVICAL SPINE FINDINGS

Alignment: Straightening of the cervical curvature.

Vertebrae: No fracture, evidence of discitis, or bone lesion.
Postsurgical changes from C4 through C7 ACDF.

Cord: Normal signal and morphology.

Posterior Fossa, vertebral arteries, paraspinal tissues: A 7 mm T1
hyperintense/T2 hypointense lesion within the sella. Correlation
with dedicated study suggested.

Disc levels:

C2-3: No spinal canal or neural foraminal stenosis.

C3-4: Small posterior disc protrusion without significant spinal
canal stenosis. Uncovertebral and facet degenerative changes
resulting in mild left neural foraminal narrowing.

C4-5: No spinal canal or neural foraminal stenosis.

C5-6: Uncovertebral and facet degenerative changes resulting in
moderate bilateral neural foraminal narrowing. Mild spinal canal
stenosis.

C6-7: Uncovertebral and facet degenerative changes resulting in mild
bilateral neural foraminal narrowing. Mild spinal canal stenosis.

C7-T1: Small posterior disc protrusion and facet degenerative
changes. No spinal canal or neural foraminal stenosis.

MRI THORACIC SPINE FINDINGS

Alignment:  Physiologic.

Vertebrae: No fracture, evidence of discitis, or aggressive bone
lesion.

Cord: Anterior displacement of the cord with mild distortion at the
T6-7 level, may represent ventral cord herniation. No cord signal
abnormality.

Paraspinal and other soft tissues: Small bilateral pleural effusion
with multiple nodularities in the right lung. Please refer to
dedicated chest CT of performed today.

Disc levels:

No significant disc bulge or herniation, spinal canal or neural
foraminal stenosis at any level.

MRI LUMBAR SPINE FINDINGS

Segmentation:  Standard.

Alignment: Straightening of the lumbar lordosis. Trace
anterolisthesis of L4 over L5.

Vertebrae: No fracture, evidence of discitis, or aggressive bone
lesion.

Conus medullaris and cauda equina: Conus extends to the L2 level.
Conus and cauda equina appear normal.

Paraspinal and other soft tissues: Decreased volume of the kidneys
bilaterally. Edema in the periarticular soft tissues from the left
facet joint at L5-S1 with paraspinal soft tissue edema extending on
the left side up to the L2 level.

Disc levels:

L1-2: Mild facet degenerative changes. No spinal canal or neural
foraminal stenosis.

L2-3: Mild facet degenerative changes. No significant spinal canal
or neural foraminal stenosis.

L3-4: Shallow disc bulge, mild right and moderate left facet
degenerative changes resulting in mild to moderate left neural
foraminal narrowing.

L4-5: Disc bulge, hypertrophic facet degenerative change ligamentum
flavum redundancy resulting in mild spinal canal stenosis with
narrowing of the bilateral subarticular zones and moderate bilateral
neural foraminal narrowing.

L5-S1: Disc bulge. Advanced facet degenerative changes with
bilateral joint effusion. Facet arthritic changes more pronounced on
the left where there is a posteriorly projecting 1.1 cm synovial
cyst and prominent periarticular soft tissue edema extending
superiorly in the paraspinal musculature. Severe bilateral neural
foraminal narrowing. No spinal canal stenosis.
IMPRESSION: 1. Bilateral L5-S1 facet osteoarthritis, left greater than right,
with posteriorly projecting synovial cyst and periarticular soft
tissue edema on the left. Superimposed septic arthritis cannot be
excluded.
2. Anterior displacement of the cord with mild distortion at the
T6-7 level, may represent ventral cord the. No cord signal
abnormality.
3. No evidence of discitis/osteomyelitis of the cervical, thoracic
or lumbar spine.
4. Mild degenerative changes of the cervical spine with moderate
bilateral neural foraminal narrowing at C5-6.
5. Severe bilateral neural foraminal narrowing at L5-S1 and moderate
bilateral at L4-5.

ADDENDUM:
Chest CT interpretation is provided here, as the original study was
incorrectly linked to the patient's spine MR exams done the same day
and not dictated at that time.
FINDINGS: Cardiovascular: Pulmonic trunk and heart are enlarged. No
pericardial effusion.

Mediastinum/Nodes: No pathologically enlarged mediastinal or
axillary lymph nodes. Hilar regions are difficult to definitively
evaluate without IV contrast. Esophagus is grossly unremarkable.

Lungs/Pleura: There are peripheral areas of cavitary consolidation
in the lungs bilaterally. Small bilateral pleural effusions with
compressive atelectasis in both lower lobes, left greater than
right. Debris is seen in the trachea.

Upper Abdomen: Visualized portion of the liver is unremarkable. A
small stone is seen in the gallbladder. Visualized portions of the
adrenal glands, spleen, pancreas, stomach and bowel are grossly
unremarkable.

Musculoskeletal: Degenerative changes in the spine. No worrisome
lytic or sclerotic lesions.
IMPRESSION: 1. Septic emboli.
2. Small bilateral pleural effusions.
3. Cholelithiasis.
4. Enlarged pulmonic trunk, indicative of pulmonary arterial
hypertension.

*** End of Addendum ***
Addendum:
FINDINGS: MRI CERVICAL SPINE FINDINGS

Alignment: Straightening of the cervical curvature.

Vertebrae: No fracture, evidence of discitis, or bone lesion.
Postsurgical changes from C4 through C7 ACDF.

Cord: Normal signal and morphology.

Posterior Fossa, vertebral arteries, paraspinal tissues: A 7 mm T1
hyperintense/T2 hypointense lesion within the sella. Correlation
with dedicated study suggested.

Disc levels:

C2-3: No spinal canal or neural foraminal stenosis.

C3-4: Small posterior disc protrusion without significant spinal
canal stenosis. Uncovertebral and facet degenerative changes
resulting in mild left neural foraminal narrowing.

C4-5: No spinal canal or neural foraminal stenosis.

C5-6: Uncovertebral and facet degenerative changes resulting in
moderate bilateral neural foraminal narrowing. Mild spinal canal
stenosis.

C6-7: Uncovertebral and facet degenerative changes resulting in mild
bilateral neural foraminal narrowing. Mild spinal canal stenosis.

C7-T1: Small posterior disc protrusion and facet degenerative
changes. No spinal canal or neural foraminal stenosis.

MRI THORACIC SPINE FINDINGS

Alignment:  Physiologic.

Vertebrae: No fracture, evidence of discitis, or aggressive bone
lesion.

Cord: Anterior displacement of the cord with mild distortion at the
T6-7 level, may represent ventral cord herniation. No cord signal
abnormality.

Paraspinal and other soft tissues: Small bilateral pleural effusion
with multiple nodularities in the right lung. Please refer to
dedicated chest CT of performed today.

Disc levels:

No significant disc bulge or herniation, spinal canal or neural
foraminal stenosis at any level.

MRI LUMBAR SPINE FINDINGS

Segmentation:  Standard.

Alignment: Straightening of the lumbar lordosis. Trace
anterolisthesis of L4 over L5.

Vertebrae: No fracture, evidence of discitis, or aggressive bone
lesion.

Conus medullaris and cauda equina: Conus extends to the L2 level.
Conus and cauda equina appear normal.

Paraspinal and other soft tissues: Decreased volume of the kidneys
bilaterally. Edema in the periarticular soft tissues from the left
facet joint at L5-S1 with paraspinal soft tissue edema extending on
the left side up to the L2 level.

Disc levels:

L1-2: Mild facet degenerative changes. No spinal canal or neural
foraminal stenosis.

L2-3: Mild facet degenerative changes. No significant spinal canal
or neural foraminal stenosis.

L3-4: Shallow disc bulge, mild right and moderate left facet
degenerative changes resulting in mild to moderate left neural
foraminal narrowing.

L4-5: Disc bulge, hypertrophic facet degenerative change ligamentum
flavum redundancy resulting in mild spinal canal stenosis with
narrowing of the bilateral subarticular zones and moderate bilateral
neural foraminal narrowing.

L5-S1: Disc bulge. Advanced facet degenerative changes with
bilateral joint effusion. Facet arthritic changes more pronounced on
the left where there is a posteriorly projecting 1.1 cm synovial
cyst and prominent periarticular soft tissue edema extending
superiorly in the paraspinal musculature. Severe bilateral neural
foraminal narrowing. No spinal canal stenosis.
IMPRESSION: 1. Bilateral L5-S1 facet osteoarthritis, left greater than right,
with posteriorly projecting synovial cyst and periarticular soft
tissue edema on the left. Superimposed septic arthritis cannot be
excluded.
2. Anterior displacement of the cord with mild distortion at the
T6-7 level, may represent ventral cord the. No cord signal
abnormality.
3. No evidence of discitis/osteomyelitis of the cervical, thoracic
or lumbar spine.
4. Mild degenerative changes of the cervical spine with moderate
bilateral neural foraminal narrowing at C5-6.
5. Severe bilateral neural foraminal narrowing at L5-S1 and moderate
bilateral at L4-5.

*** End of Addendum ***
FINDINGS: MRI CERVICAL SPINE FINDINGS

Alignment: Straightening of the cervical curvature.

Vertebrae: No fracture, evidence of discitis, or bone lesion.
Postsurgical changes from C4 through C7 ACDF.

Cord: Normal signal and morphology.

Posterior Fossa, vertebral arteries, paraspinal tissues: A 7 mm T1
hyperintense/T2 hypointense lesion within the sella. Correlation
with dedicated study suggested.

Disc levels:

C2-3: No spinal canal or neural foraminal stenosis.

C3-4: Small posterior disc protrusion without significant spinal
canal stenosis. Uncovertebral and facet degenerative changes
resulting in mild left neural foraminal narrowing.

C4-5: No spinal canal or neural foraminal stenosis.

C5-6: Uncovertebral and facet degenerative changes resulting in
moderate bilateral neural foraminal narrowing. Mild spinal canal
stenosis.

C6-7: Uncovertebral and facet degenerative changes resulting in mild
bilateral neural foraminal narrowing. Mild spinal canal stenosis.

C7-T1: Small posterior disc protrusion and facet degenerative
changes. No spinal canal or neural foraminal stenosis.

MRI THORACIC SPINE FINDINGS

Alignment:  Physiologic.

Vertebrae: No fracture, evidence of discitis, or aggressive bone
lesion.

Cord: Anterior displacement of the cord with mild distortion at the
T6-7 level, may represent ventral cord herniation. No cord signal
abnormality.

Paraspinal and other soft tissues: Small bilateral pleural effusion
with multiple nodularities in the right lung. Please refer to
dedicated chest CT of performed today.

Disc levels:

No significant disc bulge or herniation, spinal canal or neural
foraminal stenosis at any level.

MRI LUMBAR SPINE FINDINGS

Segmentation:  Standard.

Alignment: Straightening of the lumbar lordosis. Trace
anterolisthesis of L4 over L5.

Vertebrae: No fracture, evidence of discitis, or aggressive bone
lesion.

Conus medullaris and cauda equina: Conus extends to the L2 level.
Conus and cauda equina appear normal.

Paraspinal and other soft tissues: Decreased volume of the kidneys
bilaterally. Edema in the periarticular soft tissues from the left
facet joint at L5-S1 with paraspinal soft tissue edema extending on
the left side up to the L2 level.

Disc levels:

L1-2: Mild facet degenerative changes. No spinal canal or neural
foraminal stenosis.

L2-3: Mild facet degenerative changes. No significant spinal canal
or neural foraminal stenosis.

L3-4: Shallow disc bulge, mild right and moderate left facet
degenerative changes resulting in mild to moderate left neural
foraminal narrowing.

L4-5: Disc bulge, hypertrophic facet degenerative change ligamentum
flavum redundancy resulting in mild spinal canal stenosis with
narrowing of the bilateral subarticular zones and moderate bilateral
neural foraminal narrowing.

L5-S1: Disc bulge. Advanced facet degenerative changes with
bilateral joint effusion. Facet arthritic changes more pronounced on
the left where there is a posteriorly projecting 1.1 cm synovial
cyst and prominent periarticular soft tissue edema extending
superiorly in the paraspinal musculature. Severe bilateral neural
foraminal narrowing. No spinal canal stenosis.
IMPRESSION: 1. Bilateral L5-S1 facet osteoarthritis, left greater than right,
with posteriorly projecting synovial cyst and periarticular soft
tissue edema on the left. Superimposed septic arthritis cannot be
excluded.
2. Anterior displacement of the cord with mild distortion at the
T6-7 level, may represent ventral cord the. No cord signal
abnormality.
3. No evidence of discitis/osteomyelitis of the cervical, thoracic
or lumbar spine.
4. Mild degenerative changes of the cervical spine with moderate
bilateral neural foraminal narrowing at C5-6.
5. Severe bilateral neural foraminal narrowing at L5-S1 and moderate
bilateral at L4-5.

## 2021-01-17 MED ORDER — POTASSIUM CHLORIDE 10 MEQ/100ML IV SOLN
10.0000 meq | INTRAVENOUS | Status: AC
Start: 2021-01-17 — End: 2021-01-18
  Administered 2021-01-17 (×4): 10 meq via INTRAVENOUS
  Filled 2021-01-17 (×4): qty 100

## 2021-01-17 MED ORDER — POTASSIUM CHLORIDE CRYS ER 20 MEQ PO TBCR
30.0000 meq | EXTENDED_RELEASE_TABLET | Freq: Once | ORAL | Status: AC
  Administered 2021-01-17: 30 meq via ORAL
  Filled 2021-01-17: qty 1

## 2021-01-17 MED ORDER — POTASSIUM CHLORIDE 10 MEQ/50ML IV SOLN
INTRAVENOUS | Status: AC
  Filled 2021-01-17: qty 50

## 2021-01-17 MED ORDER — VASOPRESSIN 20 UNITS/100 ML INFUSION FOR SHOCK
0.0000 [IU]/min | INTRAVENOUS | Status: DC
  Administered 2021-01-17 (×2): 0.03 [IU]/min via INTRAVENOUS
  Filled 2021-01-17 (×3): qty 100

## 2021-01-17 MED ORDER — SODIUM CHLORIDE 0.9 % IV SOLN: INTRAVENOUS | Status: DC

## 2021-01-17 MED ORDER — MAGNESIUM SULFATE 2 GM/50ML IV SOLN
2.0000 g | Freq: Once | INTRAVENOUS | Status: AC
  Administered 2021-01-17: 2 g via INTRAVENOUS
  Filled 2021-01-17: qty 50

## 2021-01-17 MED ORDER — POTASSIUM CHLORIDE CRYS ER 20 MEQ PO TBCR
20.0000 meq | EXTENDED_RELEASE_TABLET | Freq: Once | ORAL | Status: AC
  Administered 2021-01-17: 20 meq via ORAL
  Filled 2021-01-17: qty 1

## 2021-01-17 NOTE — Progress Notes (Addendum)
NAME:  Ryan Wells, MRN:  [id number], DOB:  [birth year], LOS: 2 ADMISSION DATE:  01/15/2021, CONSULTATION DATE:  01/15/21 REFERRING MD:  ITMS, CHIEF COMPLAINT:  N/V   History of Present Illness:  65 year old man w/ hx of DM, HTN, ESRD on HD presenting with worsening nausea, vomiting x 2 days.  Presented to ER with confusion, fever, and low blood pressures.  Initially responded to fluids, now on pressors.  Broad spectrum abx started by IMTS.  PCCM consulted for ICU transfer.  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 6/6 admitted with septic shock: started on Levo . 6/7: remains on 6 of Levo; plan for HD today; Cdiff positive; BCX2 positive MSSA; abx switched to ancef and dificid . 6/8: Afib on EKG: off levo and switched to Vaso  Interim History / Subjective:   Patient in Afib this morning: Levo weaned off and started Vaso 0.3 On [place] 2 l/m Patient moaning but no complaints iHD completed yesterday; schedule for tomorrow K 2.7 this morning; currently receiving K and Mag BCID positive for MSSA bacteremia and cdif: ID following and ordered TEE   Objective   Blood pressure (!) 130/46, pulse 98, temperature 98.9 F (37.2 C), temperature source Oral, resp. rate 15, height '5\' 2"'$  (1.575 m), weight 68.5 kg, SpO2 97 %.        Intake/Output Summary (Last 24 hours) at 01/17/2021 0705 Last data filed at 01/17/2021 0500 Gross per 24 hour  Intake 707 ml  Output -145 ml  Net 852 ml   Filed Weights   01/16/21 0048 01/16/21 1336 01/16/21 1755  Weight: 69 kg 68.8 kg 68.5 kg    Examination: General:  Critically ill appearing male HEENT: MM pink/moist; [place] in place Neuro: AOx3 CV: s1s2, no m/r/g; HR irregular and tachy 120s; EKG showing afib PULM:  Dim clear BS bilaterally; 2 l/m [place] GI: soft, bsx4 active  Extremities: warm/dry,  no edema; LUE fistula without erythema, drainage, tenderness Skin: no rashes or lesions   Labs/imaging that I havepersonally reviewed  (right click and "Reselect all SmartList Selections" daily)    cdif positive BC: positive for gram positive cocci in clusters (staph aureus)  K 2.7; mag 1.9 BUN 48 from 99, Creatinine 8.77 from 15.08 Anion gap 17 from 19 WBC 10.5 from 27.9, hgb 9.7, platelets 73 from 93  Echo 6/7: EF 0000000; grade 1 diastolic dysfunction: recommend getting TEE to rule out valvular vegetation.   CT chest wo: pending MRI spine: No significant findings. Severe bilateral neural foraminal narrowing at L5-S1 and moderate bilateral at L4-5.   Resolved Hospital Problem list   Thrombocytopenia  Assessment & Plan:   MSSA bacteremia: Positive BC on 6/7 for staph aureus. CDiff: positive on 6/7 Septic shock: Unclear when RIJ TDC was removed. Current access is LE AV fistula. 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 P: -Nephro following: continue iHD -Replete K and mag this morning: recheck BMP/mag this afternoon -Trend BMP/Mag/CBC   Cholelithiasis: CT abdomen and abd [place] suggest no cholangitis N/V P: -zofran prn and supportive care -Trend CBC  Hypovolemic hyponatremia (improving) P: -Trend BMP  Metabolic encephlopathy (Improving) P: -improving but will continue to assess  DM2 with hyperglycemia P: -SSI and CBG monitoring  Hx HTN - does  not appear to be on home meds/ antihypertensives  Best practice (right click and "Reselect all SmartList Selections" daily)  Diet:  Oral Pain/Anxiety/Delirium protocol (if indicated): No VAP protocol (if indicated): Not indicated DVT prophylaxis: Subcutaneous Heparin GI prophylaxis: PPI Glucose control:  SSI Yes Central venous access:  N/A  Arterial line:  N/A Foley:  N/A Mobility:  bed rest  PT consulted: N/A Last date of multidisciplinary goals of care discussion [patient and wife updated at bedside 6/8 via tele- interpreter services] Code Status:  full code Disposition: ICU   Critical care time: 35 minutes       JD Rexene Agent [facility] 01/17/2021, 7:05 AM  Please see [email or web address] for pager details.  From 7A-7P if no response, please call [phone]. After hours, please call ELink [phone].

## 2021-01-17 NOTE — Progress Notes (Signed)
[facility] NEPHROLOGY PROGRESS NOTE  Assessment/ Plan: Pt is a 65 y.o. yo male with ESRD on HD MWF at [facility], with past medical history of DM, HTN, secondary hyperparathyroidism, anemia, last HD on 6/3 who presented to the ER with fever, N/Vand diarrhea, seen as a consultation for the management of ESRD.    HD Orders: [place] MWF 3:45 hrs 180NRe 400/500 68 kg 2.0K2.0 Ca UFP 4 AVF -Heparin 2200 units IV TIW -Hectorol 4 mcg IV TIW  #Septic shock due to MSSA bacteremia: AV fistula site looks clean.  Seen by ID and currently on cefazolin.  Blood pressure acceptable on a small dose of Levophed.  Per primary team.  # ESRD: MWF: Status post dialysis yesterday off schedule without any ultrafiltration.  Volume status is acceptable and he still on pressor therefore no urgent need for dialysis today.  We will assess tomorrow morning for HD need.  If unable to tolerate dialysis then he may need HD catheter placement for CRRT.  #Hypokalemia due to diarrhea.  Replete oral potassium chloride.  Monitor lab.  # Anemia of ESRD: Hemoglobin slight drop today probably because of sepsis. Continue to monitor.  #Secondary hyperparathyroidism:  Phosphorus level elevated therefore continue binders when able to take orally.  On vitamin D.  Monitor lab.   Subjective: Seen and examined in ICU.  The blood culture positive for MSSA.  Seen by ID.  Received dialysis yesterday when he developed tachycardia.  No other new event. Objective Vital signs in last 24 hours: Vitals:   01/17/21 0745 01/17/21 0800 01/17/21 0815 01/17/21 0830  BP: 126/62 126/62  125/76  Pulse: (!) 103 (!) 114 (!) 44 76  Resp: 20 17 (!) 25 19  Temp:  99.3 F (37.4 C)    TempSrc:  Oral    SpO2: 96% 99% 100% 99%  Weight:      Height:       Weight change: -0.2 kg  Intake/Output Summary (Last 24 hours) at 01/17/2021 0903 Last data filed at 01/17/2021 0500 Gross per 24 hour  Intake 707 ml  Output -145 ml  Net 852 ml        Labs: Basic Metabolic Panel: Recent Labs  Lab 01/15/21 2049 01/16/21 0103 01/16/21 1447 01/16/21 1840 01/17/21 0659  NA 128* 133* 127* 135 132*  K 4.5 5.3* 3.2* 4.1 2.7*  CL 89* 99 91* 96* 94*  CO2 21* 19* 17* 22 23  GLUCOSE 182* 153* 242* 149* 143*  BUN 95* 47* 99* 39* 48*  CREATININE 14.22* 3.12* 15.08* 6.75* 8.77*  CALCIUM 7.8* 8.5* 7.6* 7.3* 7.5*  PHOS 4.5 8.3*  --   --   --    Liver Function Tests: Recent Labs  Lab 01/15/21 0612 01/15/21 2049 01/16/21 0103  AST 42* 55*  --   ALT 29 27  --   ALKPHOS 100 92  --   BILITOT 1.3* 1.3*  --   PROT 8.2* 6.7  --   ALBUMIN 3.7 3.1* 3.0*   Recent Labs  Lab 01/15/21 0612  LIPASE 33   No results for input(s): AMMONIA in the last 168 hours. CBC: Recent Labs  Lab 01/15/21 0612 01/15/21 1342 01/15/21 2049 01/16/21 0103 01/16/21 1530 01/17/21 0659  WBC 15.3*  --  10.0 27.9* 10.6* 10.5  NEUTROABS  --   --  9.0* 23.7*  --   --   HGB 11.9*   < > 11.5* 11.7* 10.4* 9.7*  HCT 34.9*   < > 34.3*  40.5 29.3* 28.0*  MCV 94.3  --  93.5 91.4 90.4 91.5  PLT 104*  --  93* 294 76* 73*   < > = values in this interval not displayed.   Cardiac Enzymes: No results for input(s): CKTOTAL, CKMB, CKMBINDEX, TROPONINI in the last 168 hours. CBG: Recent Labs  Lab 01/16/21 1137 01/16/21 1557 01/16/21 1805 01/16/21 2013 01/17/21 0021  GLUCAP 303* 158* 129* 164* 152*    Iron Studies: No results for input(s): IRON, TIBC, TRANSFERRIN, FERRITIN in the last 72 hours. Studies/Results: DG Elbow 2 Views Left  Result Date: 01/15/2021 CLINICAL DATA:  Left elbow swelling EXAM: LEFT ELBOW - 2 VIEW COMPARISON:  None. FINDINGS: No fracture or dislocation is seen. The joint spaces are preserved. No displaced elbow joint fat pads suggest an elbow joint effusion. Mild soft tissue swelling overlying the olecranon. In the absence of trauma, this appearance suggests olecranon bursitis. Surgical clips in the visualized soft tissues. IMPRESSION:  Mild soft tissue swelling overlying the olecranon. In the absence of trauma, this appearance suggests olecranon bursitis. Electronically Signed   By: Julian Hy M.D.   On: 01/15/2021 21:45   US Abdomen Limited  Result Date: 01/15/2021 CLINICAL DATA:  Right upper quadrant pain EXAM: ULTRASOUND ABDOMEN LIMITED RIGHT UPPER QUADRANT COMPARISON:  CT abdomen and pelvis January 15, 2021 FINDINGS: Gallbladder: Within the gallbladder, there is a 3 mm echogenic focus which does not show significant shadowing but is felt to move slightly. There is an inherent 2 mm echogenic focus which shadows in the gallbladder. There is felt to be cholelithiasis. Cholelithiasis is actually better delineated by CT compared to this study. No gallbladder wall thickening or pericholecystic fluid. No sonographic Murphy sign noted by sonographer. Common bile duct: Diameter: 3 mm. No intrahepatic or extrahepatic biliary duct dilatation. Liver: No focal lesion identified. Within normal limits in parenchymal echogenicity. Portal vein is patent on color Doppler imaging with normal direction of blood flow towards the liver. Other: None. IMPRESSION: Small apparent gallstones, better delineated by recent CT. No gallbladder wall thickening or pericholecystic fluid evident. Study otherwise unremarkable. Electronically Signed   By: Lowella Grip III M.D.   On: 01/15/2021 11:19   DG Chest Port 1 View  Result Date: 01/15/2021 CLINICAL DATA:  Abdominal pain for 3 days.  Nausea vomiting. EXAM: PORTABLE CHEST 1 VIEW COMPARISON:  Chest x-ray 06/08/2019, CT chest 11/27/2013 FINDINGS: Slightly increased in enlarged cardiac silhouette. The heart size and mediastinal contours are otherwise unchanged. Aortic calcification. Bilateral lower lobe patchy airspace opacities most prominent within the peripheries. No focal consolidation. Similar-appearing slightly increased interstitial markings. Blunting of the right costophrenic angle with no definite pleural  effusion. No pneumothorax. No acute osseous abnormality.  Cervical surgical hardware. IMPRESSION: 1. Bilateral lower lobe patchy airspace opacities that are most prominent within the peripheries. Findings suggestive of infection/inflammation. COVID-19 infection not excluded. 2. Slightly increased in enlarged cardiac silhouette. 3.  Aortic Atherosclerosis (ICD10-I70.0). Electronically Signed   By: Iven Finn M.D.   On: 01/15/2021 22:20   ECHOCARDIOGRAM COMPLETE  Result Date: 01/16/2021    ECHOCARDIOGRAM REPORT   Patient Name:   Ryan Wells Date of Exam: 01/16/2021 Medical Rec #:  [id number]            Height:       62.0 in Accession #:    [id number]           Weight:       152.1 lb Date of Birth:  Jan 01, 1956  BSA:          1.702 m Patient Age:    73 years             BP:           132/56 mmHg Patient Gender: M                    HR:           96 bpm. Exam Location:  Inpatient Procedure: 2D Echo, Cardiac Doppler, Color Doppler and Intracardiac            Opacification Agent Indications:    Endocarditis I38  History:        Patient has prior history of Echocardiogram examinations, most                 recent 01/21/2020. CAD and Acute MI, Signs/Symptoms:Chest Pain;                 Risk Factors:Diabetes, Hypertension and Sleep Apnea. Chronic                 kidney diesase. AAA.  Sonographer:    Darlina Sicilian RDCS Referring Phys: [id number] [place]  1. Left ventricular ejection fraction, by estimation, is 55 to 60%. The left ventricle has normal function. The left ventricle has no regional wall motion abnormalities. Left ventricular diastolic parameters are consistent with Grade I diastolic dysfunction (impaired relaxation).  2. Right ventricular systolic function is normal. The right ventricular size is normal.  3. The mitral valve is normal in structure. No evidence of mitral valve regurgitation. No evidence of mitral stenosis.  4. The aortic valve is normal in structure. Aortic  valve regurgitation is not visualized. Mild aortic valve sclerosis is present, with no evidence of aortic valve stenosis.  5. The inferior vena cava is normal in size with greater than 50% respiratory variability, suggesting right atrial pressure of 3 mmHg. Conclusion(s)/Recommendation(s): No evidence of valvular vegetations on this transthoracic echocardiogram. Would recommend a transesophageal echocardiogram to exclude infective endocarditis if clinically indicated. FINDINGS  Left Ventricle: Left ventricular ejection fraction, by estimation, is 55 to 60%. The left ventricle has normal function. The left ventricle has no regional wall motion abnormalities. Definity contrast agent was given IV to delineate the left ventricular  endocardial borders. The left ventricular internal cavity size was normal in size. There is no left ventricular hypertrophy. Left ventricular diastolic parameters are consistent with Grade I diastolic dysfunction (impaired relaxation). Indeterminate filling pressures. Right Ventricle: The right ventricular size is normal. No increase in right ventricular wall thickness. Right ventricular systolic function is normal. Left Atrium: Left atrial size was normal in size. Right Atrium: Right atrial size was normal in size. Pericardium: There is no evidence of pericardial effusion. Mitral Valve: The mitral valve is normal in structure. No evidence of mitral valve regurgitation. No evidence of mitral valve stenosis. Tricuspid Valve: The tricuspid valve is normal in structure. Tricuspid valve regurgitation is trivial. No evidence of tricuspid stenosis. Aortic Valve: The aortic valve is normal in structure. Aortic valve regurgitation is not visualized. Mild aortic valve sclerosis is present, with no evidence of aortic valve stenosis. Pulmonic Valve: The pulmonic valve was normal in structure. Pulmonic valve regurgitation is trivial. No evidence of pulmonic stenosis. Aorta: The aortic root is normal in  size and structure. Venous: The inferior vena cava is normal in size with greater than 50% respiratory variability, suggesting right atrial pressure of 3 mmHg. IAS/Shunts:  No atrial level shunt detected by color flow Doppler.  LEFT VENTRICLE PLAX 2D LVIDd:         5.10 cm      Diastology LVIDs:         3.70 cm      LV e' medial:    5.11 cm/s LV PW:         0.80 cm      LV E/e' medial:  17.8 LV IVS:        0.90 cm      LV e' lateral:   9.25 cm/s LVOT diam:     1.90 cm      LV E/e' lateral: 9.9 LV SV:         56 LV SV Index:   33 LVOT Area:     2.84 cm  LV Volumes (MOD) LV vol d, MOD A2C: 114.0 ml LV vol d, MOD A4C: 124.0 ml LV vol s, MOD A2C: 48.3 ml LV vol s, MOD A4C: 66.7 ml LV SV MOD A2C:     65.7 ml LV SV MOD A4C:     124.0 ml LV SV MOD BP:      66.0 ml RIGHT VENTRICLE RV S prime:     23.80 cm/s TAPSE (M-mode): 2.0 cm LEFT ATRIUM             Index       RIGHT ATRIUM          Index LA diam:        3.40 cm 2.00 cm/m  RA Area:     9.66 cm LA Vol (A2C):   28.3 ml 16.63 ml/m RA Volume:   17.40 ml 10.22 ml/m LA Vol (A4C):   38.4 ml 22.56 ml/m LA Biplane Vol: 33.3 ml 19.57 ml/m  AORTIC VALVE LVOT Vmax:   113.00 cm/s LVOT Vmean:  79.700 cm/s LVOT VTI:    0.196 m  AORTA Ao Root diam: 3.40 cm Ao Asc diam:  3.20 cm MITRAL VALVE MV Area (PHT): 3.54 cm    SHUNTS MV Decel Time: 214 msec    Systemic VTI:  0.20 m MV E velocity: 91.20 cm/s  Systemic Diam: 1.90 cm MV A velocity: 97.00 cm/s MV E/A ratio:  0.94 Mihai Croitoru MD Electronically signed by Sanda Klein MD Signature Date/Time: 01/16/2021/12:08:55 PM    Final     Medications: Infusions: . sodium chloride    . sodium chloride    . sodium chloride Stopped (01/17/21 0414)  .  ceFAZolin (ANCEF) IV Stopped (01/16/21 1848)  . magnesium sulfate bolus IVPB 2 g (01/17/21 0826)  . norepinephrine (LEVOPHED) Adult infusion Stopped (01/17/21 0451)  . potassium chloride 10 mEq (01/17/21 0831)  . potassium chloride    . vasopressin 0.03 Units/min (01/17/21 [id number])     Scheduled Medications: . Chlorhexidine Gluconate Cloth  6 each Topical Q0600  . doxercalciferol  4 mcg Intravenous Q M,W,F-HD  . fidaxomicin  200 mg Oral BID  . heparin  2,200 Units Dialysis Once in dialysis  . insulin aspart  3-9 Units Subcutaneous Q4H  . multivitamin  1 tablet Oral QHS  . pantoprazole (PROTONIX) IV  40 mg Intravenous Daily  . sucroferric oxyhydroxide  1,000 mg Oral TID WC    have reviewed scheduled and prn medications.  Physical Exam: General:NAD, comfortable, able to lie flat Heart:RRR, s1s2 nl Lungs:clear b/l, no crackle Abdomen:soft, Non-tender, non-distended Extremities:No edema Dialysis Access: AV fistula.    Tesla Keeler Tanna Furry 01/17/2021,9:03 AM  LOS: 2  days

## 2021-01-17 NOTE — Progress Notes (Signed)
[facility]  Date of Admission:  01/15/2021   Total days of antibiotics 3         ASSESSMENT: Blood culture grew MSSA bacteremia in the setting of hemodialysis and fistula.  Chest x-ray showed bilateral patchy opacity concerning for septic emboli.  Will order TEE to rule out endocarditis.  MRI of cervical, thoracic and lumbar spine was negative for osteomyelitis.  Will continue cefazolin.   C. difficile test came back positive for antigen but negative for toxin.  Low suspicion for active C. difficile infection.  Dificid per primary team  PLAN: 1. Continue IV cefazolin 2. Ordered TEE.  Earliest date is 6/10/122 per card master 3. Repeat blood culture today.  Need to have a negative blood culture before placing a central line or HD catheter. 4. Continue monitor left fistula  Active Problems:   Intractable nausea and vomiting   Sepsis ([facility])   Shock ([place] [facility])   MSSA bacteremia   ESRD needing dialysis ([facility])   Gallstones   Nausea and vomiting in adult   Septic shock ([facility])   C. difficile diarrhea   Scheduled Meds: . Chlorhexidine Gluconate Cloth  6 each Topical Q0600  . doxercalciferol  4 mcg Intravenous Q M,W,F-HD  . fidaxomicin  200 mg Oral BID  . heparin  2,200 Units Dialysis Once in dialysis  . insulin aspart  3-9 Units Subcutaneous Q4H  . multivitamin  1 tablet Oral QHS  . pantoprazole (PROTONIX) IV  40 mg Intravenous Daily  . sucroferric oxyhydroxide  1,000 mg Oral TID WC   Continuous Infusions: . sodium chloride    . sodium chloride    . sodium chloride Stopped (01/17/21 0414)  .  ceFAZolin (ANCEF) IV Stopped (01/16/21 1848)  . norepinephrine (LEVOPHED) Adult infusion Stopped (01/17/21 0451)  . potassium chloride 10 mEq (01/17/21 1028)  . potassium chloride    . vasopressin 0.03 Units/min (01/17/21 1018)   PRN Meds:.sodium chloride, sodium chloride, acetaminophen **OR** acetaminophen, lidocaine (PF), lidocaine-prilocaine, ondansetron  **OR** ondansetron (ZOFRAN) IV, oxyCODONE, pentafluoroprop-tetrafluoroeth   SUBJECTIVE: Patient is seen at bedside today.  He appears uncomfortable.  When asked about pain, he pointed at his left upper chest.  Review of Systems: Review of Systems  Musculoskeletal: Positive for myalgias.    No Known Allergies  OBJECTIVE: Vitals:   01/17/21 0930 01/17/21 0945 01/17/21 1000 01/17/21 1015  BP: (!) 85/56 (!) 105/47 (!) 112/52 (!) 115/45  Pulse: (!) 123 (!) 113 (!) 116 (!) 111  Resp: '16 18 17 20  '$ Temp:      TempSrc:      SpO2: 97% 99% 98% 99%  Weight:      Height:       Body mass index is 27.62 kg/m.  Physical Exam Constitutional:      General: He is in acute distress.     Appearance: He is ill-appearing.     Comments: Patient appears lethargic.  Moaning and groaning.  Cardiovascular:     Rate and Rhythm: Regular rhythm. Tachycardia present.  Pulmonary:     Effort: Pulmonary effort is normal. No respiratory distress.  Abdominal:     General: Bowel sounds are normal.  Musculoskeletal:     Comments: Tenderness to palpation of the left upper chest wall.  Left fistula tender to palpation.  Skin:    General: Skin is warm.     Coloration: Skin is not jaundiced.  Neurological:     Mental Status: He is  alert.     Lab Results Lab Results  Component Value Date   WBC 10.5 01/17/2021   HGB 9.7 (L) 01/17/2021   HCT 28.0 (L) 01/17/2021   MCV 91.5 01/17/2021   PLT 73 (L) 01/17/2021    Lab Results  Component Value Date   CREATININE 8.77 (H) 01/17/2021   BUN 48 (H) 01/17/2021   NA 132 (L) 01/17/2021   K 2.7 (LL) 01/17/2021   CL 94 (L) 01/17/2021   CO2 23 01/17/2021    Lab Results  Component Value Date   ALT 27 01/15/2021   AST 55 (H) 01/15/2021   ALKPHOS 92 01/15/2021   BILITOT 1.3 (H) 01/15/2021     Microbiology: Recent Results (from the past 240 hour(s))  Resp Panel by RT-PCR (Flu A&B, Covid) Nasopharyngeal Swab     Status: None   Collection Time: 01/15/21   9:52 AM   Specimen: Nasopharyngeal Swab; Nasopharyngeal(NP) swabs in vial transport medium  Result Value Ref Range Status   SARS Coronavirus 2 by RT PCR NEGATIVE NEGATIVE Final    Comment: (NOTE) SARS-CoV-2 target nucleic acids are NOT DETECTED.  The SARS-CoV-2 RNA is generally detectable in upper respiratory specimens during the acute phase of infection. The lowest concentration of SARS-CoV-2 viral copies this assay can detect is 138 copies/mL. A negative result does not preclude SARS-Cov-2 infection and should not be used as the sole basis for treatment or other patient management decisions. A negative result may occur with  improper specimen collection/handling, submission of specimen other than nasopharyngeal swab, presence of viral mutation(s) within the areas targeted by this assay, and inadequate number of viral copies(<138 copies/mL). A negative result must be combined with clinical observations, patient history, and epidemiological information. The expected result is Negative.  Fact Sheet for Patients:  EntrepreneurPulse.com.au  Fact Sheet for Healthcare Providers:  IncredibleEmployment.be  This test is no t yet approved or cleared by the [place] FDA and  has been authorized for detection and/or diagnosis of SARS-CoV-2 by FDA under an Emergency Use Authorization (EUA). This EUA will remain  in effect (meaning this test can be used) for the duration of the COVID-19 declaration under Section 564(b)(1) of the Act, 21 U.S.C.section 360bbb-3(b)(1), unless the authorization is terminated  or revoked sooner.       Influenza A by PCR NEGATIVE NEGATIVE Final   Influenza B by PCR NEGATIVE NEGATIVE Final    Comment: (NOTE) The Xpert Xpress SARS-CoV-2/FLU/RSV plus assay is intended as an aid in the diagnosis of influenza from Nasopharyngeal swab specimens and should not be used as a sole basis for treatment. Nasal washings and aspirates  are unacceptable for Xpert Xpress SARS-CoV-2/FLU/RSV testing.  Fact Sheet for Patients: EntrepreneurPulse.com.au  Fact Sheet for Healthcare Providers: IncredibleEmployment.be  This test is not yet approved or cleared by the [place] FDA and has been authorized for detection and/or diagnosis of SARS-CoV-2 by FDA under an Emergency Use Authorization (EUA). This EUA will remain in effect (meaning this test can be used) for the duration of the COVID-19 declaration under Section 564(b)(1) of the Act, 21 U.S.C. section 360bbb-3(b)(1), unless the authorization is terminated or revoked.  Performed at [facility], [place] [address] [place], [place] [address]   Culture, blood (routine x 2)     Status: Abnormal (Preliminary result)   Collection Time: 01/15/21  8:28 PM   Specimen: BLOOD RIGHT FOREARM  Result Value Ref Range Status   Specimen Description BLOOD RIGHT FOREARM  Final  Special Requests   Final    BOTTLES DRAWN AEROBIC AND ANAEROBIC Blood Culture results may not be optimal due to an inadequate volume of blood received in culture bottles   Culture  Setup Time   Final    GRAM POSITIVE COCCI IN CLUSTERS ANAEROBIC BOTTLE ONLY CRITICAL RESULT CALLED TO, READ BACK BY AND VERIFIED WITH: J. FRENS PHARMD,AT 1050 01/16/21 [facility] Performed at [facility], [place] [address], [place], [place] [address]    Culture STAPHYLOCOCCUS AUREUS (A)  Final   Report Status PENDING  Incomplete  Culture, blood (routine x 2)     Status: Abnormal (Preliminary result)   Collection Time: 01/15/21  8:49 PM   Specimen: BLOOD  Result Value Ref Range Status   Specimen Description BLOOD RIGHT UPPER ARM  Final   Special Requests   Final    BOTTLES DRAWN AEROBIC AND ANAEROBIC Blood Culture adequate volume   Culture  Setup Time   Final    GRAM POSITIVE COCCI IN CLUSTERS IN BOTH AEROBIC AND ANAEROBIC BOTTLES CRITICAL RESULT CALLED TO, READ BACK BY AND VERIFIED  WITH: J. FRENS PHARMD,AT 1050 01/16/21 D. VANHOOK    Culture (A)  Final    STAPHYLOCOCCUS AUREUS SUSCEPTIBILITIES TO FOLLOW Performed at [facility], [place] [address], [place], [place] [address]    Report Status PENDING  Incomplete  Blood Culture ID Panel (Reflexed)     Status: Abnormal   Collection Time: 01/15/21  8:49 PM  Result Value Ref Range Status   Enterococcus faecalis NOT DETECTED NOT DETECTED Final   Enterococcus Faecium NOT DETECTED NOT DETECTED Final   Listeria monocytogenes NOT DETECTED NOT DETECTED Final   Staphylococcus species DETECTED (A) NOT DETECTED Final    Comment: CRITICAL RESULT CALLED TO, READ BACK BY AND VERIFIED WITH: J. FRENS PHARMD,AT 1050 01/16/21 D. VANHOOK    Staphylococcus aureus (BCID) DETECTED (A) NOT DETECTED Final    Comment: CRITICAL RESULT CALLED TO, READ BACK BY AND VERIFIED WITH: J. FRENS PHARMD,AT [id number] 01/16/21 D. VANHOOK    Staphylococcus epidermidis NOT DETECTED NOT DETECTED Final   Staphylococcus lugdunensis NOT DETECTED NOT DETECTED Final   Streptococcus species NOT DETECTED NOT DETECTED Final   Streptococcus agalactiae NOT DETECTED NOT DETECTED Final   Streptococcus pneumoniae NOT DETECTED NOT DETECTED Final   Streptococcus pyogenes NOT DETECTED NOT DETECTED Final   A.calcoaceticus-baumannii NOT DETECTED NOT DETECTED Final   Bacteroides fragilis NOT DETECTED NOT DETECTED Final   Enterobacterales NOT DETECTED NOT DETECTED Final   Enterobacter cloacae complex NOT DETECTED NOT DETECTED Final   Escherichia coli NOT DETECTED NOT DETECTED Final   Klebsiella aerogenes NOT DETECTED NOT DETECTED Final   Klebsiella oxytoca NOT DETECTED NOT DETECTED Final   Klebsiella pneumoniae NOT DETECTED NOT DETECTED Final   Proteus species NOT DETECTED NOT DETECTED Final   Salmonella species NOT DETECTED NOT DETECTED Final   Serratia marcescens NOT DETECTED NOT DETECTED Final   Haemophilus influenzae NOT DETECTED NOT DETECTED Final   Neisseria  meningitidis NOT DETECTED NOT DETECTED Final   Pseudomonas aeruginosa NOT DETECTED NOT DETECTED Final   Stenotrophomonas maltophilia NOT DETECTED NOT DETECTED Final   Candida albicans NOT DETECTED NOT DETECTED Final   Candida auris NOT DETECTED NOT DETECTED Final   Candida glabrata NOT DETECTED NOT DETECTED Final   Candida krusei NOT DETECTED NOT DETECTED Final   Candida parapsilosis NOT DETECTED NOT DETECTED Final   Candida tropicalis NOT DETECTED NOT DETECTED Final   Cryptococcus neoformans/gattii NOT DETECTED  NOT DETECTED Final   Meth resistant mecA/C and MREJ NOT DETECTED NOT DETECTED Final    Comment: Performed at [facility], [place] [address], [place], [place] [address]  MRSA PCR Screening     Status: None   Collection Time: 01/16/21 12:50 AM   Specimen: Nasal Mucosa; Nasopharyngeal  Result Value Ref Range Status   MRSA by PCR NEGATIVE NEGATIVE Final    Comment:        The GeneXpert MRSA Assay (FDA approved for NASAL specimens only), is one component of a comprehensive MRSA colonization surveillance program. It is not intended to diagnose MRSA infection nor to guide or monitor treatment for MRSA infections. Performed at [facility], [place] [address] [place], [place] [address]   Gastrointestinal Panel by PCR , Stool     Status: None   Collection Time: 01/16/21  8:58 AM   Specimen: Stool  Result Value Ref Range Status   Campylobacter species NOT DETECTED NOT DETECTED Final   Plesimonas shigelloides NOT DETECTED NOT DETECTED Final   Salmonella species NOT DETECTED NOT DETECTED Final   Yersinia enterocolitica NOT DETECTED NOT DETECTED Final   Vibrio species NOT DETECTED NOT DETECTED Final   Vibrio cholerae NOT DETECTED NOT DETECTED Final   Enteroaggregative E coli (EAEC) NOT DETECTED NOT DETECTED Final   Enteropathogenic E coli (EPEC) NOT DETECTED NOT DETECTED Final   Enterotoxigenic E coli (ETEC) NOT DETECTED NOT DETECTED Final   Shiga like toxin producing E  coli (STEC) NOT DETECTED NOT DETECTED Final   Shigella/Enteroinvasive E coli (EIEC) NOT DETECTED NOT DETECTED Final   Cryptosporidium NOT DETECTED NOT DETECTED Final   Cyclospora cayetanensis NOT DETECTED NOT DETECTED Final   Entamoeba histolytica NOT DETECTED NOT DETECTED Final   Giardia lamblia NOT DETECTED NOT DETECTED Final   Adenovirus F40/41 NOT DETECTED NOT DETECTED Final   Astrovirus NOT DETECTED NOT DETECTED Final   Norovirus GI/GII NOT DETECTED NOT DETECTED Final   Rotavirus A NOT DETECTED NOT DETECTED Final   Sapovirus (I, II, IV, and V) NOT DETECTED NOT DETECTED Final    Comment: Performed at [facility], [place] [place], [place] [address]  C Difficile Quick Screen w PCR reflex     Status: Abnormal   Collection Time: 01/16/21  8:58 AM   Specimen: STOOL  Result Value Ref Range Status   C Diff antigen POSITIVE (A) NEGATIVE Final   C Diff toxin NEGATIVE NEGATIVE Final   C Diff interpretation Results are indeterminate. See PCR results.  Final    Comment: Performed at [facility], [place] [address], [place], [place] [address]  C. Diff by PCR, Reflexed     Status: Abnormal   Collection Time: 01/16/21  8:58 AM  Result Value Ref Range Status   Toxigenic C. Difficile by PCR POSITIVE (A) NEGATIVE Final    Comment: Positive for toxigenic C. difficile with little to no toxin production. Only treat if clinical presentation suggests symptomatic illness. Performed at [facility], [place] [address] [place], [place] [address]     Gaylan Gerold, [facility] [phone] pager   [phone] cell 01/17/2021, 10:55 AM

## 2021-01-17 NOTE — Plan of Care (Signed)

## 2021-01-17 NOTE — Progress Notes (Signed)
D/w MRI results with NS-- L4-S1 abnormalities appear chronic. No intervention required.  Julian Hy, DO 01/17/21 3:03 PM [facility]

## 2021-01-18 LAB — GLUCOSE, CAPILLARY
Glucose-Capillary: 116 mg/dL — ABNORMAL HIGH (ref 70–99)
Glucose-Capillary: 120 mg/dL — ABNORMAL HIGH (ref 70–99)
Glucose-Capillary: 163 mg/dL — ABNORMAL HIGH (ref 70–99)
Glucose-Capillary: 167 mg/dL — ABNORMAL HIGH (ref 70–99)

## 2021-01-18 LAB — CULTURE, BLOOD (ROUTINE X 2): Special Requests: ADEQUATE

## 2021-01-18 LAB — CBC
HCT: 28 % — ABNORMAL LOW (ref 39.0–52.0)
Hemoglobin: 9.4 g/dL — ABNORMAL LOW (ref 13.0–17.0)
MCH: 31.4 pg (ref 26.0–34.0)
MCHC: 33.6 g/dL (ref 30.0–36.0)
MCV: 93.6 fL (ref 80.0–100.0)
Platelets: 78 10*3/uL — ABNORMAL LOW (ref 150–400)
RBC: 2.99 MIL/uL — ABNORMAL LOW (ref 4.22–5.81)
RDW: 13.6 % (ref 11.5–15.5)
WBC: 11.3 10*3/uL — ABNORMAL HIGH (ref 4.0–10.5)
nRBC: 0 % (ref 0.0–0.2)

## 2021-01-18 LAB — BASIC METABOLIC PANEL
Anion gap: 15 (ref 5–15)
BUN: 61 mg/dL — ABNORMAL HIGH (ref 8–23)
CO2: 22 mmol/L (ref 22–32)
Calcium: 7.5 mg/dL — ABNORMAL LOW (ref 8.9–10.3)
Chloride: 94 mmol/L — ABNORMAL LOW (ref 98–111)
Creatinine, Ser: 10.25 mg/dL — ABNORMAL HIGH (ref 0.61–1.24)
GFR, Estimated: 5 mL/min — ABNORMAL LOW (ref >60)
Glucose, Bld: 182 mg/dL — ABNORMAL HIGH (ref 70–99)
Potassium: 3.8 mmol/L (ref 3.5–5.1)
Sodium: 131 mmol/L — ABNORMAL LOW (ref 135–145)

## 2021-01-18 LAB — MAGNESIUM: Magnesium: 2.6 mg/dL — ABNORMAL HIGH (ref 1.7–2.4)

## 2021-01-18 MED ORDER — SODIUM CHLORIDE 0.9 % IV SOLN: 100.0000 mL | INTRAVENOUS | Status: DC | PRN

## 2021-01-18 MED ORDER — CHLORHEXIDINE GLUCONATE CLOTH 2 % EX PADS
6.0000 | MEDICATED_PAD | Freq: Every day | CUTANEOUS | Status: DC
  Administered 2021-01-18 – 2021-01-30 (×12): 6 via TOPICAL

## 2021-01-18 MED ORDER — MIDODRINE HCL 5 MG PO TABS
10.0000 mg | ORAL_TABLET | Freq: Three times a day (TID) | ORAL | Status: DC
  Administered 2021-01-19 – 2021-01-30 (×31): 10 mg via ORAL
  Filled 2021-01-18 (×32): qty 2

## 2021-01-18 MED ORDER — LIDOCAINE HCL (PF) 1 % IJ SOLN: 5.0000 mL | INTRAMUSCULAR | Status: DC | PRN

## 2021-01-18 MED ORDER — HEPARIN SODIUM (PORCINE) 1000 UNIT/ML DIALYSIS
20.0000 [IU]/kg | INTRAMUSCULAR | Status: DC | PRN
  Filled 2021-01-18: qty 2

## 2021-01-18 MED ORDER — HEPARIN SODIUM (PORCINE) 1000 UNIT/ML DIALYSIS
1000.0000 [IU] | INTRAMUSCULAR | Status: DC | PRN
  Filled 2021-01-18: qty 1

## 2021-01-18 MED ORDER — LIDOCAINE-PRILOCAINE 2.5-2.5 % EX CREA
1.0000 "application " | TOPICAL_CREAM | CUTANEOUS | Status: DC | PRN
  Filled 2021-01-18: qty 5

## 2021-01-18 MED ORDER — SODIUM CHLORIDE 0.9 % IV SOLN: INTRAVENOUS | Status: DC

## 2021-01-18 MED ORDER — PENTAFLUOROPROP-TETRAFLUOROETH EX AERO: 1.0000 "application " | INHALATION_SPRAY | CUTANEOUS | Status: DC | PRN

## 2021-01-18 MED ORDER — ALTEPLASE 2 MG IJ SOLR: 2.0000 mg | Freq: Once | INTRAMUSCULAR | Status: DC | PRN

## 2021-01-18 NOTE — Progress Notes (Signed)
[facility] NEPHROLOGY PROGRESS NOTE  Assessment/ Plan: Pt is a 65 y.o. yo male with ESRD on HD MWF at [facility], with past medical history of DM, HTN, secondary hyperparathyroidism, anemia, last HD on 6/3 who presented to the ER with fever, N/Vand diarrhea, seen as a consultation for the management of ESRD.    HD Orders: [place] MWF 3:45 hrs 180NRe 400/500 68 kg 2.0K2.0 Ca UFP 4 AVF -Heparin 2200 units IV TIW -Hectorol 4 mcg IV TIW  #Septic shock due to MSSA bacteremia: AV fistula site looks clean.  Seen by ID and currently on cefazolin.  Plan for TEE on 6/10.  Still requiring vasopressin to maintain blood pressure.  Diarrhea is improving.     # ESRD: MWF: He was having diarrhea yesterday.  Volume status is acceptable.  Potassium 3.8.  He is still on low-dose vasopressin.  No urgent need for dialysis today and hopefully he will be off of pressors by tomorrow.  Please avoid any IV fluid administration.  #Hypokalemia due to diarrhea.  Potassium level improved now.  Monitor lab.   # Anemia of ESRD: Hemoglobin slight drop probably because of sepsis. Continue to monitor.   #Secondary hyperparathyroidism:  Phosphorus level elevated therefore continue binders when able to take orally.  On vitamin D.  Monitor lab.   Subjective: Seen and examined.  Diarrhea has improved.  Currently on vasopressin with acceptable blood pressure reading.  No chest pain, shortness of breath.  No new event. Objective Vital signs in last 24 hours: Vitals:   01/18/21 0339 01/18/21 0400 01/18/21 0500 01/18/21 0600  BP:  (!) 132/49 (!) 135/51 (!) 118/50  Pulse:  72 75 70  Resp:  '14 13 12  '$ Temp: 99.8 F (37.7 C) 99.8 F (37.7 C)    TempSrc: Axillary Axillary    SpO2:  98% 100% 98%  Weight:   71.8 kg   Height:       Weight change: 3 kg  Intake/Output Summary (Last 24 hours) at 01/18/2021 0800 Last data filed at 01/18/2021 0700 Gross per 24 hour  Intake 767.57 ml  Output --  Net 767.57 ml         Labs: Basic Metabolic Panel: Recent Labs  Lab 01/15/21 2049 01/16/21 0103 01/16/21 1447 01/17/21 1300 01/17/21 1644 01/18/21 0026  NA 128* 133*   < > 132* 131* 131*  K 4.5 5.3*   < > 3.8 3.4* 3.8  CL 89* 99   < > 93* 94* 94*  CO2 21* 19*   < > '24 24 22  '$ GLUCOSE 182* 153*   < > 128* 177* 182*  BUN 95* 47*   < > 50* 56* 61*  CREATININE 14.22* 3.12*   < > 9.20* 9.50* 10.25*  CALCIUM 7.8* 8.5*   < > 7.6* 7.5* 7.5*  PHOS 4.5 8.3*  --   --   --   --    < > = values in this interval not displayed.    Liver Function Tests: Recent Labs  Lab 01/15/21 0612 01/15/21 2049 01/16/21 0103  AST 42* 55*  --   ALT 29 27  --   ALKPHOS 100 92  --   BILITOT 1.3* 1.3*  --   PROT 8.2* 6.7  --   ALBUMIN 3.7 3.1* 3.0*    Recent Labs  Lab 01/15/21 0612  LIPASE 33    No results for input(s): AMMONIA in the last 168 hours. CBC: Recent Labs  Lab 01/15/21 2049 01/16/21  0103 01/16/21 1530 01/17/21 0659 01/18/21 0026  WBC 10.0 27.9* 10.6* 10.5 11.3*  NEUTROABS 9.0* 23.7*  --   --   --   HGB 11.5* 11.7* 10.4* 9.7* 9.4*  HCT 34.3* 40.5 29.3* 28.0* 28.0*  MCV 93.5 91.4 90.4 91.5 93.6  PLT 93* 294 76* 73* 78*    Cardiac Enzymes: No results for input(s): CKTOTAL, CKMB, CKMBINDEX, TROPONINI in the last 168 hours. CBG: Recent Labs  Lab 01/17/21 1125 01/17/21 1542 01/17/21 1955 01/17/21 2349 01/18/21 0329  GLUCAP 111* 201* 151* 182* 163*     Iron Studies: No results for input(s): IRON, TIBC, TRANSFERRIN, FERRITIN in the last 72 hours. Studies/Results: CT CHEST WO CONTRAST  Addendum Date: 01/17/2021   ADDENDUM REPORT: 01/17/2021 14:41 ADDENDUM: Chest CT interpretation is provided here, as the original study was incorrectly linked to the patient's spine MR exams done the same day and not dictated at that time. CLINICAL DATA: Chest pain during hemodialysis with nausea and vomiting. Sepsis. TECHNIQUE: Multi detector CT imaging of the chest was performed without the  administration of IV contrast. COMPARISON: 11/27/2013. FINDINGS: Cardiovascular: Pulmonic trunk and heart are enlarged. No pericardial effusion. Mediastinum/Nodes: No pathologically enlarged mediastinal or axillary lymph nodes. Hilar regions are difficult to definitively evaluate without IV contrast. Esophagus is grossly unremarkable. Lungs/Pleura: There are peripheral areas of cavitary consolidation in the lungs bilaterally. Small bilateral pleural effusions with compressive atelectasis in both lower lobes, left greater than right. Debris is seen in the trachea. Upper Abdomen: Visualized portion of the liver is unremarkable. A small stone is seen in the gallbladder. Visualized portions of the adrenal glands, spleen, pancreas, stomach and bowel are grossly unremarkable. Musculoskeletal: Degenerative changes in the spine. No worrisome lytic or sclerotic lesions. IMPRESSION: 1. Septic emboli. 2. Small bilateral pleural effusions. 3. Cholelithiasis. 4. Enlarged pulmonic trunk, indicative of pulmonary arterial hypertension. Electronically Signed   By: Lorin Picket M.D.   On: 01/17/2021 14:41   Addendum Date: 01/17/2021   ADDENDUM REPORT: 01/17/2021 11:49 Electronically Signed   By: Pedro Earls M.D.   On: 01/17/2021 11:49   Result Date: 01/17/2021 CLINICAL DATA:  Fall fall neck pain, mid back pain and lower back pain. MSSA bacteremia. Rule out discitis/osteomyelitis. EXAM: MRI CERVICAL, THORACIC AND LUMBAR SPINE WITHOUT CONTRAST TECHNIQUE: Multiplanar and multiecho pulse sequences of the cervical spine, to include the craniocervical junction and cervicothoracic junction, and thoracic and lumbar spine, were obtained without intravenous contrast. COMPARISON:  None. FINDINGS: MRI CERVICAL SPINE FINDINGS Alignment: Straightening of the cervical curvature. Vertebrae: No fracture, evidence of discitis, or bone lesion. Postsurgical changes from C4 through C7 ACDF. Cord: Normal signal and morphology.  Posterior Fossa, vertebral arteries, paraspinal tissues: A 7 mm T1 hyperintense/T2 hypointense lesion within the sella. Correlation with dedicated study suggested. Disc levels: C2-3: No spinal canal or neural foraminal stenosis. C3-4: Small posterior disc protrusion without significant spinal canal stenosis. Uncovertebral and facet degenerative changes resulting in mild left neural foraminal narrowing. C4-5: No spinal canal or neural foraminal stenosis. C5-6: Uncovertebral and facet degenerative changes resulting in moderate bilateral neural foraminal narrowing. Mild spinal canal stenosis. C6-7: Uncovertebral and facet degenerative changes resulting in mild bilateral neural foraminal narrowing. Mild spinal canal stenosis. C7-T1: Small posterior disc protrusion and facet degenerative changes. No spinal canal or neural foraminal stenosis. MRI THORACIC SPINE FINDINGS Alignment:  Physiologic. Vertebrae: No fracture, evidence of discitis, or aggressive bone lesion. Cord: Anterior displacement of the cord with mild distortion at the T6-7 level, may represent  ventral cord herniation. No cord signal abnormality. Paraspinal and other soft tissues: Small bilateral pleural effusion with multiple nodularities in the right lung. Please refer to dedicated chest CT of performed today. Disc levels: No significant disc bulge or herniation, spinal canal or neural foraminal stenosis at any level. MRI LUMBAR SPINE FINDINGS Segmentation:  Standard. Alignment: Straightening of the lumbar lordosis. Trace anterolisthesis of L4 over L5. Vertebrae: No fracture, evidence of discitis, or aggressive bone lesion. Conus medullaris and cauda equina: Conus extends to the L2 level. Conus and cauda equina appear normal. Paraspinal and other soft tissues: Decreased volume of the kidneys bilaterally. Edema in the periarticular soft tissues from the left facet joint at L5-S1 with paraspinal soft tissue edema extending on the left side up to the L2  level. Disc levels: L1-2: Mild facet degenerative changes. No spinal canal or neural foraminal stenosis. L2-3: Mild facet degenerative changes. No significant spinal canal or neural foraminal stenosis. L3-4: Shallow disc bulge, mild right and moderate left facet degenerative changes resulting in mild to moderate left neural foraminal narrowing. L4-5: Disc bulge, hypertrophic facet degenerative change ligamentum flavum redundancy resulting in mild spinal canal stenosis with narrowing of the bilateral subarticular zones and moderate bilateral neural foraminal narrowing. L5-S1: Disc bulge. Advanced facet degenerative changes with bilateral joint effusion. Facet arthritic changes more pronounced on the left where there is a posteriorly projecting 1.1 cm synovial cyst and prominent periarticular soft tissue edema extending superiorly in the paraspinal musculature. Severe bilateral neural foraminal narrowing. No spinal canal stenosis. IMPRESSION: 1. Bilateral L5-S1 facet osteoarthritis, left greater than right, with posteriorly projecting synovial cyst and periarticular soft tissue edema on the left. Superimposed septic arthritis cannot be excluded. 2. Anterior displacement of the cord with mild distortion at the T6-7 level, may represent ventral cord the. No cord signal abnormality. 3. No evidence of discitis/osteomyelitis of the cervical, thoracic or lumbar spine. 4. Mild degenerative changes of the cervical spine with moderate bilateral neural foraminal narrowing at C5-6. 5. Severe bilateral neural foraminal narrowing at L5-S1 and moderate bilateral at L4-5. Electronically Signed: By: Pedro Earls M.D. On: 01/17/2021 09:04   MR CERVICAL SPINE WO CONTRAST  Addendum Date: 01/17/2021   ADDENDUM REPORT: 01/17/2021 14:41 ADDENDUM: Chest CT interpretation is provided here, as the original study was incorrectly linked to the patient's spine MR exams done the same day and not dictated at that time. CLINICAL  DATA: Chest pain during hemodialysis with nausea and vomiting. Sepsis. TECHNIQUE: Multi detector CT imaging of the chest was performed without the administration of IV contrast. COMPARISON: 11/27/2013. FINDINGS: Cardiovascular: Pulmonic trunk and heart are enlarged. No pericardial effusion. Mediastinum/Nodes: No pathologically enlarged mediastinal or axillary lymph nodes. Hilar regions are difficult to definitively evaluate without IV contrast. Esophagus is grossly unremarkable. Lungs/Pleura: There are peripheral areas of cavitary consolidation in the lungs bilaterally. Small bilateral pleural effusions with compressive atelectasis in both lower lobes, left greater than right. Debris is seen in the trachea. Upper Abdomen: Visualized portion of the liver is unremarkable. A small stone is seen in the gallbladder. Visualized portions of the adrenal glands, spleen, pancreas, stomach and bowel are grossly unremarkable. Musculoskeletal: Degenerative changes in the spine. No worrisome lytic or sclerotic lesions. IMPRESSION: 1. Septic emboli. 2. Small bilateral pleural effusions. 3. Cholelithiasis. 4. Enlarged pulmonic trunk, indicative of pulmonary arterial hypertension. Electronically Signed   By: Lorin Picket M.D.   On: 01/17/2021 14:41   Addendum Date: 01/17/2021   ADDENDUM REPORT: 01/17/2021 11:49 Electronically Signed  By: Pedro Earls M.D.   On: 01/17/2021 11:49   Result Date: 01/17/2021 CLINICAL DATA:  Fall fall neck pain, mid back pain and lower back pain. MSSA bacteremia. Rule out discitis/osteomyelitis. EXAM: MRI CERVICAL, THORACIC AND LUMBAR SPINE WITHOUT CONTRAST TECHNIQUE: Multiplanar and multiecho pulse sequences of the cervical spine, to include the craniocervical junction and cervicothoracic junction, and thoracic and lumbar spine, were obtained without intravenous contrast. COMPARISON:  None. FINDINGS: MRI CERVICAL SPINE FINDINGS Alignment: Straightening of the cervical curvature.  Vertebrae: No fracture, evidence of discitis, or bone lesion. Postsurgical changes from C4 through C7 ACDF. Cord: Normal signal and morphology. Posterior Fossa, vertebral arteries, paraspinal tissues: A 7 mm T1 hyperintense/T2 hypointense lesion within the sella. Correlation with dedicated study suggested. Disc levels: C2-3: No spinal canal or neural foraminal stenosis. C3-4: Small posterior disc protrusion without significant spinal canal stenosis. Uncovertebral and facet degenerative changes resulting in mild left neural foraminal narrowing. C4-5: No spinal canal or neural foraminal stenosis. C5-6: Uncovertebral and facet degenerative changes resulting in moderate bilateral neural foraminal narrowing. Mild spinal canal stenosis. C6-7: Uncovertebral and facet degenerative changes resulting in mild bilateral neural foraminal narrowing. Mild spinal canal stenosis. C7-T1: Small posterior disc protrusion and facet degenerative changes. No spinal canal or neural foraminal stenosis. MRI THORACIC SPINE FINDINGS Alignment:  Physiologic. Vertebrae: No fracture, evidence of discitis, or aggressive bone lesion. Cord: Anterior displacement of the cord with mild distortion at the T6-7 level, may represent ventral cord herniation. No cord signal abnormality. Paraspinal and other soft tissues: Small bilateral pleural effusion with multiple nodularities in the right lung. Please refer to dedicated chest CT of performed today. Disc levels: No significant disc bulge or herniation, spinal canal or neural foraminal stenosis at any level. MRI LUMBAR SPINE FINDINGS Segmentation:  Standard. Alignment: Straightening of the lumbar lordosis. Trace anterolisthesis of L4 over L5. Vertebrae: No fracture, evidence of discitis, or aggressive bone lesion. Conus medullaris and cauda equina: Conus extends to the L2 level. Conus and cauda equina appear normal. Paraspinal and other soft tissues: Decreased volume of the kidneys bilaterally. Edema in  the periarticular soft tissues from the left facet joint at L5-S1 with paraspinal soft tissue edema extending on the left side up to the L2 level. Disc levels: L1-2: Mild facet degenerative changes. No spinal canal or neural foraminal stenosis. L2-3: Mild facet degenerative changes. No significant spinal canal or neural foraminal stenosis. L3-4: Shallow disc bulge, mild right and moderate left facet degenerative changes resulting in mild to moderate left neural foraminal narrowing. L4-5: Disc bulge, hypertrophic facet degenerative change ligamentum flavum redundancy resulting in mild spinal canal stenosis with narrowing of the bilateral subarticular zones and moderate bilateral neural foraminal narrowing. L5-S1: Disc bulge. Advanced facet degenerative changes with bilateral joint effusion. Facet arthritic changes more pronounced on the left where there is a posteriorly projecting 1.1 cm synovial cyst and prominent periarticular soft tissue edema extending superiorly in the paraspinal musculature. Severe bilateral neural foraminal narrowing. No spinal canal stenosis. IMPRESSION: 1. Bilateral L5-S1 facet osteoarthritis, left greater than right, with posteriorly projecting synovial cyst and periarticular soft tissue edema on the left. Superimposed septic arthritis cannot be excluded. 2. Anterior displacement of the cord with mild distortion at the T6-7 level, may represent ventral cord the. No cord signal abnormality. 3. No evidence of discitis/osteomyelitis of the cervical, thoracic or lumbar spine. 4. Mild degenerative changes of the cervical spine with moderate bilateral neural foraminal narrowing at C5-6. 5. Severe bilateral neural foraminal narrowing at  L5-S1 and moderate bilateral at L4-5. Electronically Signed: By: Pedro Earls M.D. On: 01/17/2021 09:04   MR THORACIC SPINE WO CONTRAST  Addendum Date: 01/17/2021   ADDENDUM REPORT: 01/17/2021 14:41 ADDENDUM: Chest CT interpretation is provided  here, as the original study was incorrectly linked to the patient's spine MR exams done the same day and not dictated at that time. CLINICAL DATA: Chest pain during hemodialysis with nausea and vomiting. Sepsis. TECHNIQUE: Multi detector CT imaging of the chest was performed without the administration of IV contrast. COMPARISON: 11/27/2013. FINDINGS: Cardiovascular: Pulmonic trunk and heart are enlarged. No pericardial effusion. Mediastinum/Nodes: No pathologically enlarged mediastinal or axillary lymph nodes. Hilar regions are difficult to definitively evaluate without IV contrast. Esophagus is grossly unremarkable. Lungs/Pleura: There are peripheral areas of cavitary consolidation in the lungs bilaterally. Small bilateral pleural effusions with compressive atelectasis in both lower lobes, left greater than right. Debris is seen in the trachea. Upper Abdomen: Visualized portion of the liver is unremarkable. A small stone is seen in the gallbladder. Visualized portions of the adrenal glands, spleen, pancreas, stomach and bowel are grossly unremarkable. Musculoskeletal: Degenerative changes in the spine. No worrisome lytic or sclerotic lesions. IMPRESSION: 1. Septic emboli. 2. Small bilateral pleural effusions. 3. Cholelithiasis. 4. Enlarged pulmonic trunk, indicative of pulmonary arterial hypertension. Electronically Signed   By: Lorin Picket M.D.   On: 01/17/2021 14:41   Addendum Date: 01/17/2021   ADDENDUM REPORT: 01/17/2021 11:49 Electronically Signed   By: Pedro Earls M.D.   On: 01/17/2021 11:49   Result Date: 01/17/2021 CLINICAL DATA:  Fall fall neck pain, mid back pain and lower back pain. MSSA bacteremia. Rule out discitis/osteomyelitis. EXAM: MRI CERVICAL, THORACIC AND LUMBAR SPINE WITHOUT CONTRAST TECHNIQUE: Multiplanar and multiecho pulse sequences of the cervical spine, to include the craniocervical junction and cervicothoracic junction, and thoracic and lumbar spine, were obtained  without intravenous contrast. COMPARISON:  None. FINDINGS: MRI CERVICAL SPINE FINDINGS Alignment: Straightening of the cervical curvature. Vertebrae: No fracture, evidence of discitis, or bone lesion. Postsurgical changes from C4 through C7 ACDF. Cord: Normal signal and morphology. Posterior Fossa, vertebral arteries, paraspinal tissues: A 7 mm T1 hyperintense/T2 hypointense lesion within the sella. Correlation with dedicated study suggested. Disc levels: C2-3: No spinal canal or neural foraminal stenosis. C3-4: Small posterior disc protrusion without significant spinal canal stenosis. Uncovertebral and facet degenerative changes resulting in mild left neural foraminal narrowing. C4-5: No spinal canal or neural foraminal stenosis. C5-6: Uncovertebral and facet degenerative changes resulting in moderate bilateral neural foraminal narrowing. Mild spinal canal stenosis. C6-7: Uncovertebral and facet degenerative changes resulting in mild bilateral neural foraminal narrowing. Mild spinal canal stenosis. C7-T1: Small posterior disc protrusion and facet degenerative changes. No spinal canal or neural foraminal stenosis. MRI THORACIC SPINE FINDINGS Alignment:  Physiologic. Vertebrae: No fracture, evidence of discitis, or aggressive bone lesion. Cord: Anterior displacement of the cord with mild distortion at the T6-7 level, may represent ventral cord herniation. No cord signal abnormality. Paraspinal and other soft tissues: Small bilateral pleural effusion with multiple nodularities in the right lung. Please refer to dedicated chest CT of performed today. Disc levels: No significant disc bulge or herniation, spinal canal or neural foraminal stenosis at any level. MRI LUMBAR SPINE FINDINGS Segmentation:  Standard. Alignment: Straightening of the lumbar lordosis. Trace anterolisthesis of L4 over L5. Vertebrae: No fracture, evidence of discitis, or aggressive bone lesion. Conus medullaris and cauda equina: Conus extends to the  L2 level. Conus and cauda equina  appear normal. Paraspinal and other soft tissues: Decreased volume of the kidneys bilaterally. Edema in the periarticular soft tissues from the left facet joint at L5-S1 with paraspinal soft tissue edema extending on the left side up to the L2 level. Disc levels: L1-2: Mild facet degenerative changes. No spinal canal or neural foraminal stenosis. L2-3: Mild facet degenerative changes. No significant spinal canal or neural foraminal stenosis. L3-4: Shallow disc bulge, mild right and moderate left facet degenerative changes resulting in mild to moderate left neural foraminal narrowing. L4-5: Disc bulge, hypertrophic facet degenerative change ligamentum flavum redundancy resulting in mild spinal canal stenosis with narrowing of the bilateral subarticular zones and moderate bilateral neural foraminal narrowing. L5-S1: Disc bulge. Advanced facet degenerative changes with bilateral joint effusion. Facet arthritic changes more pronounced on the left where there is a posteriorly projecting 1.1 cm synovial cyst and prominent periarticular soft tissue edema extending superiorly in the paraspinal musculature. Severe bilateral neural foraminal narrowing. No spinal canal stenosis. IMPRESSION: 1. Bilateral L5-S1 facet osteoarthritis, left greater than right, with posteriorly projecting synovial cyst and periarticular soft tissue edema on the left. Superimposed septic arthritis cannot be excluded. 2. Anterior displacement of the cord with mild distortion at the T6-7 level, may represent ventral cord the. No cord signal abnormality. 3. No evidence of discitis/osteomyelitis of the cervical, thoracic or lumbar spine. 4. Mild degenerative changes of the cervical spine with moderate bilateral neural foraminal narrowing at C5-6. 5. Severe bilateral neural foraminal narrowing at L5-S1 and moderate bilateral at L4-5. Electronically Signed: By: Pedro Earls M.D. On: 01/17/2021 09:04   MR  LUMBAR SPINE WO CONTRAST  Addendum Date: 01/17/2021   ADDENDUM REPORT: 01/17/2021 14:41 ADDENDUM: Chest CT interpretation is provided here, as the original study was incorrectly linked to the patient's spine MR exams done the same day and not dictated at that time. CLINICAL DATA: Chest pain during hemodialysis with nausea and vomiting. Sepsis. TECHNIQUE: Multi detector CT imaging of the chest was performed without the administration of IV contrast. COMPARISON: 11/27/2013. FINDINGS: Cardiovascular: Pulmonic trunk and heart are enlarged. No pericardial effusion. Mediastinum/Nodes: No pathologically enlarged mediastinal or axillary lymph nodes. Hilar regions are difficult to definitively evaluate without IV contrast. Esophagus is grossly unremarkable. Lungs/Pleura: There are peripheral areas of cavitary consolidation in the lungs bilaterally. Small bilateral pleural effusions with compressive atelectasis in both lower lobes, left greater than right. Debris is seen in the trachea. Upper Abdomen: Visualized portion of the liver is unremarkable. A small stone is seen in the gallbladder. Visualized portions of the adrenal glands, spleen, pancreas, stomach and bowel are grossly unremarkable. Musculoskeletal: Degenerative changes in the spine. No worrisome lytic or sclerotic lesions. IMPRESSION: 1. Septic emboli. 2. Small bilateral pleural effusions. 3. Cholelithiasis. 4. Enlarged pulmonic trunk, indicative of pulmonary arterial hypertension. Electronically Signed   By: Lorin Picket M.D.   On: 01/17/2021 14:41   Addendum Date: 01/17/2021   ADDENDUM REPORT: 01/17/2021 11:49 Electronically Signed   By: Pedro Earls M.D.   On: 01/17/2021 11:49   Result Date: 01/17/2021 CLINICAL DATA:  Fall fall neck pain, mid back pain and lower back pain. MSSA bacteremia. Rule out discitis/osteomyelitis. EXAM: MRI CERVICAL, THORACIC AND LUMBAR SPINE WITHOUT CONTRAST TECHNIQUE: Multiplanar and multiecho pulse sequences of  the cervical spine, to include the craniocervical junction and cervicothoracic junction, and thoracic and lumbar spine, were obtained without intravenous contrast. COMPARISON:  None. FINDINGS: MRI CERVICAL SPINE FINDINGS Alignment: Straightening of the cervical curvature. Vertebrae: No fracture, evidence  of discitis, or bone lesion. Postsurgical changes from C4 through C7 ACDF. Cord: Normal signal and morphology. Posterior Fossa, vertebral arteries, paraspinal tissues: A 7 mm T1 hyperintense/T2 hypointense lesion within the sella. Correlation with dedicated study suggested. Disc levels: C2-3: No spinal canal or neural foraminal stenosis. C3-4: Small posterior disc protrusion without significant spinal canal stenosis. Uncovertebral and facet degenerative changes resulting in mild left neural foraminal narrowing. C4-5: No spinal canal or neural foraminal stenosis. C5-6: Uncovertebral and facet degenerative changes resulting in moderate bilateral neural foraminal narrowing. Mild spinal canal stenosis. C6-7: Uncovertebral and facet degenerative changes resulting in mild bilateral neural foraminal narrowing. Mild spinal canal stenosis. C7-T1: Small posterior disc protrusion and facet degenerative changes. No spinal canal or neural foraminal stenosis. MRI THORACIC SPINE FINDINGS Alignment:  Physiologic. Vertebrae: No fracture, evidence of discitis, or aggressive bone lesion. Cord: Anterior displacement of the cord with mild distortion at the T6-7 level, may represent ventral cord herniation. No cord signal abnormality. Paraspinal and other soft tissues: Small bilateral pleural effusion with multiple nodularities in the right lung. Please refer to dedicated chest CT of performed today. Disc levels: No significant disc bulge or herniation, spinal canal or neural foraminal stenosis at any level. MRI LUMBAR SPINE FINDINGS Segmentation:  Standard. Alignment: Straightening of the lumbar lordosis. Trace anterolisthesis of L4  over L5. Vertebrae: No fracture, evidence of discitis, or aggressive bone lesion. Conus medullaris and cauda equina: Conus extends to the L2 level. Conus and cauda equina appear normal. Paraspinal and other soft tissues: Decreased volume of the kidneys bilaterally. Edema in the periarticular soft tissues from the left facet joint at L5-S1 with paraspinal soft tissue edema extending on the left side up to the L2 level. Disc levels: L1-2: Mild facet degenerative changes. No spinal canal or neural foraminal stenosis. L2-3: Mild facet degenerative changes. No significant spinal canal or neural foraminal stenosis. L3-4: Shallow disc bulge, mild right and moderate left facet degenerative changes resulting in mild to moderate left neural foraminal narrowing. L4-5: Disc bulge, hypertrophic facet degenerative change ligamentum flavum redundancy resulting in mild spinal canal stenosis with narrowing of the bilateral subarticular zones and moderate bilateral neural foraminal narrowing. L5-S1: Disc bulge. Advanced facet degenerative changes with bilateral joint effusion. Facet arthritic changes more pronounced on the left where there is a posteriorly projecting 1.1 cm synovial cyst and prominent periarticular soft tissue edema extending superiorly in the paraspinal musculature. Severe bilateral neural foraminal narrowing. No spinal canal stenosis. IMPRESSION: 1. Bilateral L5-S1 facet osteoarthritis, left greater than right, with posteriorly projecting synovial cyst and periarticular soft tissue edema on the left. Superimposed septic arthritis cannot be excluded. 2. Anterior displacement of the cord with mild distortion at the T6-7 level, may represent ventral cord the. No cord signal abnormality. 3. No evidence of discitis/osteomyelitis of the cervical, thoracic or lumbar spine. 4. Mild degenerative changes of the cervical spine with moderate bilateral neural foraminal narrowing at C5-6. 5. Severe bilateral neural foraminal  narrowing at L5-S1 and moderate bilateral at L4-5. Electronically Signed: By: Pedro Earls M.D. On: 01/17/2021 09:04   ECHOCARDIOGRAM COMPLETE  Result Date: 01/16/2021    ECHOCARDIOGRAM REPORT   Patient Name:   Ryan Wells Date of Exam: 01/16/2021 Medical Rec #:  [id number]            Height:       62.0 in Accession #:    [id number]           Weight:  152.1 lb Date of Birth:  May 08, 1956             BSA:          1.702 m Patient Age:    33 years             BP:           132/56 mmHg Patient Gender: M                    HR:           96 bpm. Exam Location:  Inpatient Procedure: 2D Echo, Cardiac Doppler, Color Doppler and Intracardiac            Opacification Agent Indications:    Endocarditis I38  History:        Patient has prior history of Echocardiogram examinations, most                 recent 01/21/2020. CAD and Acute MI, Signs/Symptoms:Chest Pain;                 Risk Factors:Diabetes, Hypertension and Sleep Apnea. Chronic                 kidney diesase. AAA.  Sonographer:    Darlina Sicilian RDCS Referring Phys: [id number] [place]  1. Left ventricular ejection fraction, by estimation, is 55 to 60%. The left ventricle has normal function. The left ventricle has no regional wall motion abnormalities. Left ventricular diastolic parameters are consistent with Grade I diastolic dysfunction (impaired relaxation).  2. Right ventricular systolic function is normal. The right ventricular size is normal.  3. The mitral valve is normal in structure. No evidence of mitral valve regurgitation. No evidence of mitral stenosis.  4. The aortic valve is normal in structure. Aortic valve regurgitation is not visualized. Mild aortic valve sclerosis is present, with no evidence of aortic valve stenosis.  5. The inferior vena cava is normal in size with greater than 50% respiratory variability, suggesting right atrial pressure of 3 mmHg. Conclusion(s)/Recommendation(s): No evidence of  valvular vegetations on this transthoracic echocardiogram. Would recommend a transesophageal echocardiogram to exclude infective endocarditis if clinically indicated. FINDINGS  Left Ventricle: Left ventricular ejection fraction, by estimation, is 55 to 60%. The left ventricle has normal function. The left ventricle has no regional wall motion abnormalities. Definity contrast agent was given IV to delineate the left ventricular  endocardial borders. The left ventricular internal cavity size was normal in size. There is no left ventricular hypertrophy. Left ventricular diastolic parameters are consistent with Grade I diastolic dysfunction (impaired relaxation). Indeterminate filling pressures. Right Ventricle: The right ventricular size is normal. No increase in right ventricular wall thickness. Right ventricular systolic function is normal. Left Atrium: Left atrial size was normal in size. Right Atrium: Right atrial size was normal in size. Pericardium: There is no evidence of pericardial effusion. Mitral Valve: The mitral valve is normal in structure. No evidence of mitral valve regurgitation. No evidence of mitral valve stenosis. Tricuspid Valve: The tricuspid valve is normal in structure. Tricuspid valve regurgitation is trivial. No evidence of tricuspid stenosis. Aortic Valve: The aortic valve is normal in structure. Aortic valve regurgitation is not visualized. Mild aortic valve sclerosis is present, with no evidence of aortic valve stenosis. Pulmonic Valve: The pulmonic valve was normal in structure. Pulmonic valve regurgitation is trivial. No evidence of pulmonic stenosis. Aorta: The aortic root is normal in size and structure. Venous: The inferior vena cava  is normal in size with greater than 50% respiratory variability, suggesting right atrial pressure of 3 mmHg. IAS/Shunts: No atrial level shunt detected by color flow Doppler.  LEFT VENTRICLE PLAX 2D LVIDd:         5.10 cm      Diastology LVIDs:          3.70 cm      LV e' medial:    5.11 cm/s LV PW:         0.80 cm      LV E/e' medial:  17.8 LV IVS:        0.90 cm      LV e' lateral:   9.25 cm/s LVOT diam:     1.90 cm      LV E/e' lateral: 9.9 LV SV:         56 LV SV Index:   33 LVOT Area:     2.84 cm  LV Volumes (MOD) LV vol d, MOD A2C: 114.0 ml LV vol d, MOD A4C: 124.0 ml LV vol s, MOD A2C: 48.3 ml LV vol s, MOD A4C: 66.7 ml LV SV MOD A2C:     65.7 ml LV SV MOD A4C:     124.0 ml LV SV MOD BP:      66.0 ml RIGHT VENTRICLE RV S prime:     23.80 cm/s TAPSE (M-mode): 2.0 cm LEFT ATRIUM             Index       RIGHT ATRIUM          Index LA diam:        3.40 cm 2.00 cm/m  RA Area:     9.66 cm LA Vol (A2C):   28.3 ml 16.63 ml/m RA Volume:   17.40 ml 10.22 ml/m LA Vol (A4C):   38.4 ml 22.56 ml/m LA Biplane Vol: 33.3 ml 19.57 ml/m  AORTIC VALVE LVOT Vmax:   113.00 cm/s LVOT Vmean:  79.700 cm/s LVOT VTI:    0.196 m  AORTA Ao Root diam: 3.40 cm Ao Asc diam:  3.20 cm MITRAL VALVE MV Area (PHT): 3.54 cm    SHUNTS MV Decel Time: 214 msec    Systemic VTI:  0.20 m MV E velocity: 91.20 cm/s  Systemic Diam: 1.90 cm MV A velocity: 97.00 cm/s MV E/A ratio:  0.94 Mihai Croitoru MD Electronically signed by Sanda Klein MD Signature Date/Time: 01/16/2021/12:08:55 PM    Final      Medications: Infusions:  sodium chloride     sodium chloride     sodium chloride Stopped (01/17/21 0414)   sodium chloride 10 mL/hr at 01/18/21 0700    ceFAZolin (ANCEF) IV Stopped (01/17/21 1735)   norepinephrine (LEVOPHED) Adult infusion Stopped (01/17/21 0451)   vasopressin 0.03 Units/min (01/18/21 0700)    Scheduled Medications:  Chlorhexidine Gluconate Cloth  6 each Topical Q0600   doxercalciferol  4 mcg Intravenous Q M,W,F-HD   fidaxomicin  200 mg Oral BID   heparin  2,200 Units Dialysis Once in dialysis   insulin aspart  3-9 Units Subcutaneous Q4H   multivitamin  1 tablet Oral QHS   pantoprazole (PROTONIX) IV  40 mg Intravenous Daily   sucroferric oxyhydroxide  1,000 mg  Oral TID WC    have reviewed scheduled and prn medications.  Physical Exam: General:NAD, comfortable and able to lie flat. Heart:RRR, s1s2 nl Lungs: Clear bilateral, no wheeze or crackle Abdomen:soft, Non-tender, non-distended Extremities: No LE edema Dialysis Access: AV fistula.  Winslow Ederer Reesa Chew Meiko Stranahan 01/18/2021,8:00 AM  LOS: 3 days

## 2021-01-18 NOTE — Progress Notes (Signed)
    [facility] has been requested to perform a transesophageal echocardiogram on 01/18/2021 for MSSA bacteremia.  After careful review of history and examination, the risks and benefits of transesophageal echocardiogram have been explained including risks of esophageal damage, perforation (1:10,000 risk), bleeding, pharyngeal hematoma as well as other potential complications associated with conscious sedation including aspiration, arrhythmia, respiratory failure and death. Alternatives to treatment were discussed, questions were answered. Patient is willing to proceed.   Patient presented with MSSA bacteremia. Interview conducted with help of Patent attorney. Hospital course complicated by afib. Currently vital signs stable, platelet 78, hemoglobin 9.4.  Almyra Deforest, PA-C 01/18/2021 2:00 PM

## 2021-01-18 NOTE — Progress Notes (Signed)
NAME:  Ryan Wells, MRN:  [id number], DOB:  Feb 05, 1956, LOS: 3 ADMISSION DATE:  01/15/2021, CONSULTATION DATE:  01/15/21 REFERRING MD:  ITMS, CHIEF COMPLAINT:  N/V   History of Present Illness:  65 year old man with PMHx significant for HTN, T2DM and ESRD (on HD MWF via LUE AVF) who presented to [facility] 6/6 with worsening nausea, vomiting x 2 days.    On presentation to ED, patient was hypotensive with AMS and fever. Hypotension initially responded to fluids but eventually required pressor initiation in the setting of presumed septic shock. Broad spectrum antibiotics were started by IMTS.    PCCM consulted for ICU transfer.  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 Per Nephro, hold on HD and resume 6/10 per usual HD schedule.  Interim History / Subjective:  History taken/exam completed with the assistance of virtual Spanish interpreter Viviana  Patient is feeling much better today Complimented the staff on their care No significant complaints, just wants to wash up today Denies fever/chills, CP/SOB, n/v/abdominal pain Diarrhea is decreased Hopeful to go home soon  Objective   Blood pressure (!) 132/51, pulse 69, temperature 98 F (36.7 C), resp. rate 17, height '5\' 2"'$  (1.575 m), weight 71.8 kg, SpO2 99 %.        Intake/Output Summary (Last 24 hours) at 01/18/2021 1444 Last data filed at 01/18/2021 1400 Gross per 24 hour  Intake 475.73 ml  Output --  Net 475.73 ml    Filed Weights   01/16/21 1336 01/16/21 1755 01/18/21 0500  Weight: 68.8  kg 68.5 kg 71.8 kg   Physical Examination: General: Chronically ill-appearing middle-aged man in NAD. HEENT: [place]/AT, anicteric sclera, PERRL, moist mucous membranes. Neuro: Awake, oriented x 4. Responds to verbal stimuli. Following commands consistently. Moves all 4 extremities spontaneously. Strength 5/5 in all 4 extremities. CV: Irregularly irregular rhythm, no m/g/r. PULM: Breathing even and unlabored on 2L . Lung fields CTAB. GI: Soft, nontender, nondistended. Slightly hyperactive bowel sounds. Extremities: Trace BLE edema noted. LUE edema and mild erythema noted around fistula site. Skin: Warm/dry, mild erythema/swelling of LUE.  Resolved Hospital Problem List   Thrombocytopenia  Assessment & Plan:   MSSA bacteremia: Positive BC on 6/7 for staph aureus. CDiff: Positive on 6/7 Septic shock: Unclear when RIJ TDC was removed. Current access is LE AV fistula. P:  Afib RVR TTE negative for vegetation. CT Chest concerning for septic emboli. - Goal MAP > 65 - Continue vasopressin, off of Levo/Neo - Continue to hold off on amiodarone - ID following for MSSA bacteremia, appreciate recs - Continue Ancef, Dificid - F/u finalized Bcx - TEE scheduled for 6/10, r/o valvular vegetation  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 - Nephrology following, appreciate assistance - Holding on HD 6/9, no urgent need for RRT today, plan to resume per regular schedule 6/10 - Trend BMP - Replete electrolytes as indicated - Monitor I&Os - Avoid nephrotoxic agents as able -  Ensure adequate renal perfusion  Cholelithiasis: CT abdomen and abd [place] suggest no cholangitis N/V - Supportive care  DM2 with hyperglycemia P: - SSI - CBG monitoring  Hx HTN - Not on any home antihypertensives  Best practice (right click and "Reselect all SmartList  Selections" daily)  Diet:  Oral Pain/Anxiety/Delirium protocol (if indicated): No VAP protocol (if indicated): Not indicated DVT prophylaxis: Subcutaneous Heparin GI prophylaxis: PPI Glucose control:  SSI Yes Central venous access:  N/A  Arterial line:  N/A Foley:  N/A Mobility:  bed rest  PT consulted: N/A Last date of multidisciplinary goals of care discussion [patient and wife updated at bedside 6/8 via tele- interpreter services] Code Status:  full code Disposition: ICU  Critical care time: 77 minutes    Lestine Mount, PA-C [facility] 01/18/21 2:55 PM  Please see [email or web address] for pager details.  From 7A-7P if no response, please call [phone] After hours, please call ELink [phone]

## 2021-01-18 NOTE — Progress Notes (Signed)
[facility] for Infectious [facility]  Date of Admission:  01/15/2021   Total days of antibiotics 4         ASSESSMENT: Repeat blood culture is negative to date.  Patient was afebrile overnight and appears clinically improved.  Still required low-dose of vasopressin.  Will continue cefazolin for MSSA bacteremia.  Pending TEE tomorrow.  PLAN: Continue IV cefazolin TEE tomorrow Follow-up repeat blood culture and line holiday   Active Problems:   Intractable nausea and vomiting   Sepsis ([place])   Shock ([place])   MSSA bacteremia   ESRD needing dialysis ([place])   Gallstones   Nausea and vomiting in adult   Septic shock (HCC)   C. difficile diarrhea   Scheduled Meds:  Chlorhexidine Gluconate Cloth  6 each Topical Q0600   Chlorhexidine Gluconate Cloth  6 each Topical Q0600   doxercalciferol  4 mcg Intravenous Q M,W,F-HD   fidaxomicin  200 mg Oral BID   heparin  2,200 Units Dialysis Once in dialysis   insulin aspart  3-9 Units Subcutaneous Q4H   multivitamin  1 tablet Oral QHS   pantoprazole (PROTONIX) IV  40 mg Intravenous Daily   sucroferric oxyhydroxide  1,000 mg Oral TID WC   Continuous Infusions:  sodium chloride     sodium chloride     sodium chloride Stopped (01/17/21 0414)   sodium chloride 10 mL/hr at 01/18/21 0800    ceFAZolin (ANCEF) IV Stopped (01/17/21 1735)   norepinephrine (LEVOPHED) Adult infusion Stopped (01/17/21 0451)   vasopressin 0.03 Units/min (01/18/21 0800)   PRN Meds:.sodium chloride, sodium chloride, acetaminophen **OR** acetaminophen, lidocaine (PF), lidocaine-prilocaine, ondansetron **OR** ondansetron (ZOFRAN) IV, oxyCODONE, pentafluoroprop-tetrafluoroeth   SUBJECTIVE: Patient is seen at bedside.  He appears more comfortable compared to yesterday.  States that his diarrhea has improved.   Review of Systems: As per HPI ROS  No Known Allergies  OBJECTIVE: Vitals:   01/18/21 0500 01/18/21 0600 01/18/21 0700 01/18/21 0800  BP: (!)  135/51 (!) 118/50 (!) 108/45 (!) 135/48  Pulse: 75 70 64 67  Resp: '13 12 13 11  '$ Temp:   98.6 F (37 C)   TempSrc:   Oral   SpO2: 100% 98% 100% 99%  Weight: 71.8 kg     Height:       Body mass index is 28.95 kg/m.  Physical Exam Vitals reviewed.  Constitutional:      General: He is not in acute distress.    Appearance: He is ill-appearing.     Comments: Alert awake and in no acute distress.  Appears more comfortable compared to yesterday.  HENT:     Head: Normocephalic.  Eyes:     General: No scleral icterus.       Right eye: No discharge.        Left eye: No discharge.     Conjunctiva/sclera: Conjunctivae normal.  Cardiovascular:     Rate and Rhythm: Normal rate and regular rhythm.     Heart sounds: Normal heart sounds. No murmur heard.    Comments: +1 edema of left upper extremity Pulmonary:     Effort: Pulmonary effort is normal. No respiratory distress.  Musculoskeletal:     Comments: Left fistula nonerythematous, nontender to palpation, not warm to touch  Skin:    General: Skin is warm.  Neurological:     Mental Status: He is alert. Mental status is at baseline.  Psychiatric:        Mood and Affect:  Mood normal.        Thought Content: Thought content normal.        Judgment: Judgment normal.    Lab Results Lab Results  Component Value Date   WBC 11.3 (H) 01/18/2021   HGB 9.4 (L) 01/18/2021   HCT 28.0 (L) 01/18/2021   MCV 93.6 01/18/2021   PLT 78 (L) 01/18/2021    Lab Results  Component Value Date   CREATININE 10.25 (H) 01/18/2021   BUN 61 (H) 01/18/2021   NA 131 (L) 01/18/2021   K 3.8 01/18/2021   CL 94 (L) 01/18/2021   CO2 22 01/18/2021    Lab Results  Component Value Date   ALT 27 01/15/2021   AST 55 (H) 01/15/2021   ALKPHOS 92 01/15/2021   BILITOT 1.3 (H) 01/15/2021     Microbiology: Recent Results (from the past 240 hour(s))  Resp Panel by RT-PCR (Flu A&B, Covid) Nasopharyngeal Swab     Status: None   Collection Time: 01/15/21  9:52 AM    Specimen: Nasopharyngeal Swab; Nasopharyngeal(NP) swabs in vial transport medium  Result Value Ref Range Status   SARS Coronavirus 2 by RT PCR NEGATIVE NEGATIVE Final    Comment: (NOTE) SARS-CoV-2 target nucleic acids are NOT DETECTED.  The SARS-CoV-2 RNA is generally detectable in upper respiratory specimens during the acute phase of infection. The lowest concentration of SARS-CoV-2 viral copies this assay can detect is 138 copies/mL. A negative result does not preclude SARS-Cov-2 infection and should not be used as the sole basis for treatment or other patient management decisions. A negative result may occur with  improper specimen collection/handling, submission of specimen other than nasopharyngeal swab, presence of viral mutation(s) within the areas targeted by this assay, and inadequate number of viral copies(<138 copies/mL). A negative result must be combined with clinical observations, patient history, and epidemiological information. The expected result is Negative.  Fact Sheet for Patients:  EntrepreneurPulse.com.au  Fact Sheet for Healthcare Providers:  IncredibleEmployment.be  This test is no t yet approved or cleared by the [place] FDA and  has been authorized for detection and/or diagnosis of SARS-CoV-2 by FDA under an Emergency Use Authorization (EUA). This EUA will remain  in effect (meaning this test can be used) for the duration of the COVID-19 declaration under Section 564(b)(1) of the Act, 21 U.S.C.section 360bbb-3(b)(1), unless the authorization is terminated  or revoked sooner.       Influenza A by PCR NEGATIVE NEGATIVE Final   Influenza B by PCR NEGATIVE NEGATIVE Final    Comment: (NOTE) The Xpert Xpress SARS-CoV-2/FLU/RSV plus assay is intended as an aid in the diagnosis of influenza from Nasopharyngeal swab specimens and should not be used as a sole basis for treatment. Nasal washings and aspirates are  unacceptable for Xpert Xpress SARS-CoV-2/FLU/RSV testing.  Fact Sheet for Patients: EntrepreneurPulse.com.au  Fact Sheet for Healthcare Providers: IncredibleEmployment.be  This test is not yet approved or cleared by the [place] FDA and has been authorized for detection and/or diagnosis of SARS-CoV-2 by FDA under an Emergency Use Authorization (EUA). This EUA will remain in effect (meaning this test can be used) for the duration of the COVID-19 declaration under Section 564(b)(1) of the Act, 21 U.S.C. section 360bbb-3(b)(1), unless the authorization is terminated or revoked.  Performed at [facility], [place] [address], [place], [place] [address]   Culture, blood (routine x 2)     Status: Abnormal   Collection Time: 01/15/21  8:28 PM   Specimen:  BLOOD RIGHT FOREARM  Result Value Ref Range Status   Specimen Description BLOOD RIGHT FOREARM  Final   Special Requests   Final    BOTTLES DRAWN AEROBIC AND ANAEROBIC Blood Culture results may not be optimal due to an inadequate volume of blood received in culture bottles   Culture  Setup Time   Final    GRAM POSITIVE COCCI IN CLUSTERS ANAEROBIC BOTTLE ONLY CRITICAL RESULT CALLED TO, READ BACK BY AND VERIFIED WITH: J. FRENS PHARMD,AT 1050 01/16/21 D. VANHOOK    Culture (A)  Final    STAPHYLOCOCCUS AUREUS SUSCEPTIBILITIES PERFORMED ON PREVIOUS CULTURE WITHIN THE LAST 5 DAYS. Performed at [facility], [place] [address] [place], [place] [address]    Report Status 01/18/2021 FINAL  Final  Culture, blood (routine x 2)     Status: Abnormal   Collection Time: 01/15/21  8:49 PM   Specimen: BLOOD  Result Value Ref Range Status   Specimen Description BLOOD RIGHT UPPER ARM  Final   Special Requests   Final    BOTTLES DRAWN AEROBIC AND ANAEROBIC Blood Culture adequate volume   Culture  Setup Time   Final    GRAM POSITIVE COCCI IN CLUSTERS IN BOTH AEROBIC AND ANAEROBIC BOTTLES CRITICAL RESULT  CALLED TO, READ BACK BY AND VERIFIED WITH: J. FRENS PHARMD,AT 1050 01/16/21 [place] Performed at [facility], [place] [address] [place], [place] [address]    Culture STAPHYLOCOCCUS AUREUS (A)  Final   Report Status 01/18/2021 FINAL  Final   Organism ID, Bacteria STAPHYLOCOCCUS AUREUS  Final      Susceptibility   Staphylococcus aureus - MIC*    CIPROFLOXACIN <=0.5 SENSITIVE Sensitive     ERYTHROMYCIN <=0.25 SENSITIVE Sensitive     GENTAMICIN <=0.5 SENSITIVE Sensitive     OXACILLIN 0.5 SENSITIVE Sensitive     TETRACYCLINE <=1 SENSITIVE Sensitive     VANCOMYCIN <=0.5 SENSITIVE Sensitive     TRIMETH/SULFA <=10 SENSITIVE Sensitive     CLINDAMYCIN <=0.25 SENSITIVE Sensitive     RIFAMPIN <=0.5 SENSITIVE Sensitive     Inducible Clindamycin NEGATIVE Sensitive     * STAPHYLOCOCCUS AUREUS  Blood Culture ID Panel (Reflexed)     Status: Abnormal   Collection Time: 01/15/21  8:49 PM  Result Value Ref Range Status   Enterococcus faecalis NOT DETECTED NOT DETECTED Final   Enterococcus Faecium NOT DETECTED NOT DETECTED Final   Listeria monocytogenes NOT DETECTED NOT DETECTED Final   Staphylococcus species DETECTED (A) NOT DETECTED Final    Comment: CRITICAL RESULT CALLED TO, READ BACK BY AND VERIFIED WITH: J. FRENS PHARMD,AT 1050 01/16/21 D. VANHOOK    Staphylococcus aureus (BCID) DETECTED (A) NOT DETECTED Final    Comment: CRITICAL RESULT CALLED TO, READ BACK BY AND VERIFIED WITH: J. FRENS PHARMD,AT [id number] 01/16/21 D. VANHOOK    Staphylococcus epidermidis NOT DETECTED NOT DETECTED Final   Staphylococcus lugdunensis NOT DETECTED NOT DETECTED Final   Streptococcus species NOT DETECTED NOT DETECTED Final   Streptococcus agalactiae NOT DETECTED NOT DETECTED Final   Streptococcus pneumoniae NOT DETECTED NOT DETECTED Final   Streptococcus pyogenes NOT DETECTED NOT DETECTED Final   A.calcoaceticus-baumannii NOT DETECTED NOT DETECTED Final   Bacteroides fragilis NOT DETECTED NOT DETECTED Final    Enterobacterales NOT DETECTED NOT DETECTED Final   Enterobacter cloacae complex NOT DETECTED NOT DETECTED Final   Escherichia coli NOT DETECTED NOT DETECTED Final   Klebsiella aerogenes NOT DETECTED NOT DETECTED Final   Klebsiella oxytoca NOT DETECTED NOT  DETECTED Final   Klebsiella pneumoniae NOT DETECTED NOT DETECTED Final   Proteus species NOT DETECTED NOT DETECTED Final   Salmonella species NOT DETECTED NOT DETECTED Final   Serratia marcescens NOT DETECTED NOT DETECTED Final   Haemophilus influenzae NOT DETECTED NOT DETECTED Final   Neisseria meningitidis NOT DETECTED NOT DETECTED Final   Pseudomonas aeruginosa NOT DETECTED NOT DETECTED Final   Stenotrophomonas maltophilia NOT DETECTED NOT DETECTED Final   Candida albicans NOT DETECTED NOT DETECTED Final   Candida auris NOT DETECTED NOT DETECTED Final   Candida glabrata NOT DETECTED NOT DETECTED Final   Candida krusei NOT DETECTED NOT DETECTED Final   Candida parapsilosis NOT DETECTED NOT DETECTED Final   Candida tropicalis NOT DETECTED NOT DETECTED Final   Cryptococcus neoformans/gattii NOT DETECTED NOT DETECTED Final   Meth resistant mecA/C and MREJ NOT DETECTED NOT DETECTED Final    Comment: Performed at [facility], [place] [address] [place], [place] [address]  MRSA PCR Screening     Status: None   Collection Time: 01/16/21 12:50 AM   Specimen: Nasal Mucosa; Nasopharyngeal  Result Value Ref Range Status   MRSA by PCR NEGATIVE NEGATIVE Final    Comment:        The GeneXpert MRSA Assay (FDA approved for NASAL specimens only), is one component of a comprehensive MRSA colonization surveillance program. It is not intended to diagnose MRSA infection nor to guide or monitor treatment for MRSA infections. Performed at [facility], [place] [address], [place], [place] [address]   Gastrointestinal Panel by PCR , Stool     Status: None   Collection Time: 01/16/21  8:58 AM   Specimen: Stool  Result Value Ref Range Status    Campylobacter species NOT DETECTED NOT DETECTED Final   Plesimonas shigelloides NOT DETECTED NOT DETECTED Final   Salmonella species NOT DETECTED NOT DETECTED Final   Yersinia enterocolitica NOT DETECTED NOT DETECTED Final   Vibrio species NOT DETECTED NOT DETECTED Final   Vibrio cholerae NOT DETECTED NOT DETECTED Final   Enteroaggregative E coli (EAEC) NOT DETECTED NOT DETECTED Final   Enteropathogenic E coli (EPEC) NOT DETECTED NOT DETECTED Final   Enterotoxigenic E coli (ETEC) NOT DETECTED NOT DETECTED Final   Shiga like toxin producing E coli (STEC) NOT DETECTED NOT DETECTED Final   Shigella/Enteroinvasive E coli (EIEC) NOT DETECTED NOT DETECTED Final   Cryptosporidium NOT DETECTED NOT DETECTED Final   Cyclospora cayetanensis NOT DETECTED NOT DETECTED Final   Entamoeba histolytica NOT DETECTED NOT DETECTED Final   Giardia lamblia NOT DETECTED NOT DETECTED Final   Adenovirus F40/41 NOT DETECTED NOT DETECTED Final   Astrovirus NOT DETECTED NOT DETECTED Final   Norovirus GI/GII NOT DETECTED NOT DETECTED Final   Rotavirus A NOT DETECTED NOT DETECTED Final   Sapovirus (I, II, IV, and V) NOT DETECTED NOT DETECTED Final    Comment: Performed at [facility], [place] [place], [place] [address]  C Difficile Quick Screen w PCR reflex     Status: Abnormal   Collection Time: 01/16/21  8:58 AM   Specimen: STOOL  Result Value Ref Range Status   C Diff antigen POSITIVE (A) NEGATIVE Final   C Diff toxin NEGATIVE NEGATIVE Final   C Diff interpretation Results are indeterminate. See PCR results.  Final    Comment: Performed at [facility], [place] [address], [place], [place] [address]  C. Diff by PCR, Reflexed     Status: Abnormal  Collection Time: 01/16/21  8:58 AM  Result Value Ref Range Status   Toxigenic C. Difficile by PCR POSITIVE (A) NEGATIVE Final    Comment: Positive for toxigenic C. difficile with little to no toxin production. Only treat if clinical  presentation suggests symptomatic illness. Performed at [facility], [place] [address], [place], [place] [address]   Culture, blood (Routine X 2) w Reflex to ID Panel     Status: None (Preliminary result)   Collection Time: 01/17/21  6:59 AM   Specimen: BLOOD RIGHT HAND  Result Value Ref Range Status   Specimen Description BLOOD RIGHT HAND  Final   Special Requests   Final    BOTTLES DRAWN AEROBIC AND ANAEROBIC Blood Culture adequate volume   Culture   Final    NO GROWTH 1 DAY Performed at [facility], [place] [address], [place], [place] [address]    Report Status PENDING  Incomplete  Culture, blood (Routine X 2) w Reflex to ID Panel     Status: None (Preliminary result)   Collection Time: 01/17/21  6:59 AM   Specimen: BLOOD RIGHT WRIST  Result Value Ref Range Status   Specimen Description BLOOD RIGHT WRIST  Final   Special Requests   Final    BOTTLES DRAWN AEROBIC AND ANAEROBIC Blood Culture adequate volume   Culture   Final    NO GROWTH 1 DAY Performed at [facility], [place] [address] [place], [place] [address]    Report Status PENDING  Incomplete    Gaylan Gerold, [facility] [phone] pager   [phone] cell 01/18/2021, 10:12 AM

## 2021-01-19 ENCOUNTER — Inpatient Hospital Stay (HOSPITAL_COMMUNITY): Payer: Medicare HMO

## 2021-01-19 ENCOUNTER — Inpatient Hospital Stay (HOSPITAL_COMMUNITY): Payer: Medicare HMO | Admitting: Certified Registered"

## 2021-01-19 ENCOUNTER — Encounter (HOSPITAL_COMMUNITY)
Admission: EM | Disposition: A | Discharge status: Home Health | Payer: Self-pay | Source: Home / Self Care | Attending: Student in an Organized Health Care Education/Training Program

## 2021-01-19 ENCOUNTER — Encounter (HOSPITAL_COMMUNITY): Payer: Self-pay | Admitting: Cardiology

## 2021-01-19 DIAGNOSIS — R7881 Bacteremia: Secondary | ICD-10-CM

## 2021-01-19 HISTORY — PX: TEE WITHOUT CARDIOVERSION: SHX5443

## 2021-01-19 LAB — GLUCOSE, CAPILLARY
Glucose-Capillary: 150 mg/dL — ABNORMAL HIGH (ref 70–99)
Glucose-Capillary: 132 mg/dL — ABNORMAL HIGH (ref 70–99)
Glucose-Capillary: 95 mg/dL (ref 70–99)
Glucose-Capillary: 95 mg/dL (ref 70–99)
Glucose-Capillary: 109 mg/dL — ABNORMAL HIGH (ref 70–99)
Glucose-Capillary: 117 mg/dL — ABNORMAL HIGH (ref 70–99)
Glucose-Capillary: 214 mg/dL — ABNORMAL HIGH (ref 70–99)

## 2021-01-19 LAB — RENAL FUNCTION PANEL
Albumin: 2.3 g/dL — ABNORMAL LOW (ref 3.5–5.0)
Anion gap: 18 — ABNORMAL HIGH (ref 5–15)
BUN: 84 mg/dL — ABNORMAL HIGH (ref 8–23)
CO2: 18 mmol/L — ABNORMAL LOW (ref 22–32)
Calcium: 7.8 mg/dL — ABNORMAL LOW (ref 8.9–10.3)
Chloride: 92 mmol/L — ABNORMAL LOW (ref 98–111)
Creatinine, Ser: 12.12 mg/dL — ABNORMAL HIGH (ref 0.61–1.24)
GFR, Estimated: 4 mL/min — ABNORMAL LOW (ref >60)
Glucose, Bld: 161 mg/dL — ABNORMAL HIGH (ref 70–99)
Phosphorus: 4.5 mg/dL (ref 2.5–4.6)
Potassium: 3.8 mmol/L (ref 3.5–5.1)
Sodium: 128 mmol/L — ABNORMAL LOW (ref 135–145)

## 2021-01-19 LAB — CBC
HCT: 26.7 % — ABNORMAL LOW (ref 39.0–52.0)
Hemoglobin: 8.9 g/dL — ABNORMAL LOW (ref 13.0–17.0)
MCH: 31.7 pg (ref 26.0–34.0)
MCHC: 33.3 g/dL (ref 30.0–36.0)
MCV: 95 fL (ref 80.0–100.0)
Platelets: 136 10*3/uL — ABNORMAL LOW (ref 150–400)
RBC: 2.81 MIL/uL — ABNORMAL LOW (ref 4.22–5.81)
RDW: 13.9 % (ref 11.5–15.5)
WBC: 14 10*3/uL — ABNORMAL HIGH (ref 4.0–10.5)
nRBC: 0 % (ref 0.0–0.2)

## 2021-01-19 LAB — MAGNESIUM: Magnesium: 2.8 mg/dL — ABNORMAL HIGH (ref 1.7–2.4)

## 2021-01-19 SURGERY — ECHOCARDIOGRAM, TRANSESOPHAGEAL
Anesthesia: Monitor Anesthesia Care

## 2021-01-19 MED ORDER — PROPOFOL 500 MG/50ML IV EMUL
INTRAVENOUS | Status: DC | PRN
  Administered 2021-01-19: 75 ug/kg/min via INTRAVENOUS

## 2021-01-19 MED ORDER — OXYCODONE HCL 5 MG PO TABS
5.0000 mg | ORAL_TABLET | Freq: Four times a day (QID) | ORAL | Status: DC | PRN
  Administered 2021-01-19 – 2021-01-20 (×3): 5 mg via ORAL
  Administered 2021-01-21 – 2021-01-23 (×8): 10 mg via ORAL
  Administered 2021-01-24 – 2021-01-26 (×4): 5 mg via ORAL
  Administered 2021-01-30: 10 mg via ORAL
  Filled 2021-01-19: qty 2
  Filled 2021-01-19 (×2): qty 1
  Filled 2021-01-19 (×2): qty 2
  Filled 2021-01-19: qty 1
  Filled 2021-01-19 (×2): qty 2
  Filled 2021-01-19 (×2): qty 1
  Filled 2021-01-19 (×4): qty 2
  Filled 2021-01-19 (×2): qty 1
  Filled 2021-01-19: qty 2

## 2021-01-19 MED ORDER — ALBUMIN HUMAN 5 % IV SOLN
12.5000 g | Freq: Once | INTRAVENOUS | Status: AC
  Administered 2021-01-19: 12.5 g via INTRAVENOUS

## 2021-01-19 MED ORDER — ALBUMIN HUMAN 5 % IV SOLN
INTRAVENOUS | Status: AC
  Filled 2021-01-19: qty 250

## 2021-01-19 MED ORDER — DOXERCALCIFEROL 4 MCG/2ML IV SOLN
INTRAVENOUS | Status: AC
  Administered 2021-01-19: 4 ug via INTRAVENOUS
  Filled 2021-01-19: qty 2

## 2021-01-19 MED ORDER — PROPOFOL 10 MG/ML IV BOLUS
INTRAVENOUS | Status: DC | PRN
  Administered 2021-01-19: 15 mg via INTRAVENOUS
  Administered 2021-01-19 (×2): 20 mg via INTRAVENOUS

## 2021-01-19 NOTE — Anesthesia Procedure Notes (Signed)
Procedure Name: MAC Date/Time: 01/19/2021 1:54 PM Performed by: Imagene Riches, CRNA Pre-anesthesia Checklist: Patient identified, Emergency Drugs available, Suction available, Patient being monitored and Timeout performed Patient Re-evaluated:Patient Re-evaluated prior to induction Oxygen Delivery Method: Nasal cannula

## 2021-01-19 NOTE — Transfer of Care (Signed)
Immediate Anesthesia Transfer of Care Note  Patient: Ryan Wells  Procedure(s) Performed: TRANSESOPHAGEAL ECHOCARDIOGRAM (TEE)  Patient Location: Endoscopy Unit  Anesthesia Type:MAC  Level of Consciousness: drowsy  Airway & Oxygen Therapy: Patient Spontanous Breathing and Patient connected to nasal cannula oxygen  Post-op Assessment: Report given to RN and Post -op Vital signs reviewed and stable  Post vital signs: Reviewed and stable  Last Vitals:  Vitals Value Taken Time  BP 103/43 01/19/21 1434  Temp 36.5 C 01/19/21 1430  Pulse 62 01/19/21 1436  Resp 9 01/19/21 1436  SpO2 98 % 01/19/21 1436  Vitals shown include unvalidated device data.  Last Pain:  Vitals:   01/19/21 1430  TempSrc: Temporal  PainSc: 0-No pain         Complications: No notable events documented.

## 2021-01-19 NOTE — Anesthesia Preprocedure Evaluation (Addendum)
Anesthesia Evaluation  Patient identified by MRN, date of birth, ID band Patient awake    Reviewed: Allergy & Precautions, H&P , NPO status , Patient's Chart, lab work & pertinent test results  Airway Mallampati: III  TM Distance: >3 FB Neck ROM: Full    Dental no notable dental hx. (+) Poor Dentition, Dental Advisory Given   Pulmonary neg pulmonary ROS, former smoker,    Pulmonary exam normal breath sounds clear to auscultation       Cardiovascular hypertension,  Rhythm:Regular Rate:Normal     Neuro/Psych negative neurological ROS  negative psych ROS   GI/Hepatic negative GI ROS, Neg liver ROS,   Endo/Other  diabetesHypothyroidism   Renal/GU ESRF and DialysisRenal disease  negative genitourinary   Musculoskeletal   Abdominal   Peds  Hematology  (+) Blood dyscrasia, anemia ,   Anesthesia Other Findings   Reproductive/Obstetrics negative OB ROS                            Anesthesia Physical Anesthesia Plan  ASA: 3  Anesthesia Plan: MAC   Post-op Pain Management:    Induction: Intravenous  PONV Risk Score and Plan: 1 and Propofol infusion  Airway Management Planned: Nasal Cannula  Additional Equipment:   Intra-op Plan:   Post-operative Plan:   Informed Consent: I have reviewed the patients History and Physical, chart, labs and discussed the procedure including the risks, benefits and alternatives for the proposed anesthesia with the patient or authorized representative who has indicated his/her understanding and acceptance.     Dental advisory given  Plan Discussed with: CRNA  Anesthesia Plan Comments:         Anesthesia Quick Evaluation

## 2021-01-19 NOTE — CV Procedure (Signed)
   Transesophageal Echocardiogram  Indications:Bacteremia  Time out performed Propofol utilized for anesthesia under their supervision  Findings:  Left Ventricle: Normal ejection fraction 65%  Mitral Valve: Normal mitral valve  Aortic Valve: Normal aortic valve  Tricuspid Valve: Normal tricuspid valve  Left Atrium: No left atrial appendage thrombus  Impression: No evidence of endocarditis.  Candee Furbish, MD

## 2021-01-19 NOTE — Interval H&P Note (Signed)
History and Physical Interval Note:  01/19/2021 1:45 PM  Ryan Wells  has presented today for surgery, with the diagnosis of BACTEREMIA.  The various methods of treatment have been discussed with the patient and family. After consideration of risks, benefits and other options for treatment, the patient has consented to  Procedure(s): TRANSESOPHAGEAL ECHOCARDIOGRAM (TEE) (N/A) as a surgical intervention.  The patient's history has been reviewed, patient examined, no change in status, stable for surgery.  I have reviewed the patient's chart and labs.  Questions were answered to the patient's satisfaction.     [employer]

## 2021-01-19 NOTE — H&P (View-Only) (Signed)
NAME:  Ryan Wells, MRN:  [id number], DOB:  04/07/1956, LOS: 4 ADMISSION DATE:  01/15/2021, CONSULTATION DATE:  01/15/21 REFERRING MD:  ITMS, CHIEF COMPLAINT:  N/V   History of Present Illness:  65 year old man with PMHx significant for HTN, T2DM and ESRD (on HD MWF via LUE AVF) who presented to [facility] 6/6 with worsening nausea, vomiting x 2 days.    On presentation to ED, patient was hypotensive with AMS and fever. Hypotension initially responded to fluids but eventually required pressor initiation in the setting of presumed septic shock. Broad spectrum antibiotics were started by IMTS.    PCCM consulted for ICU transfer.  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 Per Nephro, hold on HD and resume 6/10 per usual HD schedule.  Interim History / Subjective:   Patient doing well this morning.  Tolerating dialysis blood pressure stable.  Off pressors.  Objective   Blood pressure 128/63, pulse 69, temperature 98.7 F (37.1 C), temperature source Oral, resp. rate 11, height '5\' 2"'$  (1.575 m), weight 72 kg, SpO2 100 %.        Intake/Output Summary (Last 24 hours) at 01/19/2021 1041 Last data filed at 01/19/2021 0600 Gross per 24 hour  Intake 335.57 ml  Output --  Net 335.57 ml   Filed Weights   01/16/21 1755 01/18/21 0500 01/19/21 0431  Weight: 68.5 kg 71.8 kg 72 kg   Physical Examination: General: Chronically ill-appearing elderly gentleman HEENT: NCAT, tracking appropriately Neuro: Awake alert following commands CV: Irregularly irregular,  S1-S2 PULM: Clear to auscultation bilaterally no wheeze GI: Soft, nontender nondistended Extremities: No significant edema Skin: Warm dry no rash  Resolved Hospital Problem List   Thrombocytopenia  Assessment & Plan:   MSSA bacteremia: Positive BC on 6/7 for staph aureus. CDiff: Positive on 6/7 Septic shock: Unclear when RIJ TDC was removed. Current access is LE AV fistula. P:  Afib RVR TTE negative for vegetation. CT Chest concerning for septic emboli. Plan: Continue midodrine Continue antibiotics Appreciate infectious disease input. Stable for transfer from the ICU TEE pending today Pick up tomorrow by internal medicine teaching service I have spoke with them and they are willing to pick patient up tomorrow.  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 Plan: Follow renal function Continue IHD per nephrology  Cholelithiasis: CT abdomen and abd [place] suggest no cholangitis N/V Outpatient follow-up  DM2 with hyperglycemia P: CBGs with SSI  Hx HTN -Holding home antihypertensives  Best practice (right click and "Reselect all SmartList Selections" daily)  Diet:  Oral Pain/Anxiety/Delirium protocol (if indicated): No VAP protocol (if indicated): Not indicated DVT prophylaxis: Subcutaneous Heparin GI prophylaxis: PPI Glucose control:  SSI Yes Central venous access:  N/A  Arterial line:  N/A Foley:  N/A Mobility:  bed rest  PT consulted: N/A Last date of multidisciplinary goals of care discussion [patient and wife updated at bedside 6/8 via tele- interpreter services] Code Status:  full code  Disposition: ICU   Garner Nash, DO [facility] 01/19/2021 10:47 AM

## 2021-01-19 NOTE — Progress Notes (Signed)
[facility]  Date of Admission:  01/15/2021   Total days of antibiotics 5         ASSESSMENT: Repeat blood culture negative to date.  Patient is afebrile overnight and currently off pressors.  TEE will be done today.  Continue IV cefazolin for MSSA bacteremia.  PLAN: Continue IV cefazolin Pending TEE  Active Problems:   Intractable nausea and vomiting   Sepsis ([facility])   Shock ([facility])   MSSA bacteremia   ESRD needing dialysis ([facility])   Gallstones   Nausea and vomiting in adult   Septic shock ([facility])   C. difficile diarrhea   Scheduled Meds:  Chlorhexidine Gluconate Cloth  6 each Topical Q0600   Chlorhexidine Gluconate Cloth  6 each Topical Q0600   doxercalciferol       doxercalciferol  4 mcg Intravenous Q M,W,F-HD   fidaxomicin  200 mg Oral BID   heparin  2,200 Units Dialysis Once in dialysis   insulin aspart  3-9 Units Subcutaneous Q4H   midodrine  10 mg Oral TID WC   multivitamin  1 tablet Oral QHS   pantoprazole (PROTONIX) IV  40 mg Intravenous Daily   sucroferric oxyhydroxide  1,000 mg Oral TID WC   Continuous Infusions:  sodium chloride Stopped (01/17/21 0414)   sodium chloride Stopped (01/19/21 0013)   sodium chloride     sodium chloride     sodium chloride 20 mL/hr at 01/19/21 0600    ceFAZolin (ANCEF) IV Stopped (01/18/21 1752)   vasopressin Stopped (01/18/21 1617)   PRN Meds:.sodium chloride, sodium chloride, acetaminophen **OR** acetaminophen, alteplase, heparin, heparin, lidocaine (PF), lidocaine-prilocaine, ondansetron **OR** ondansetron (ZOFRAN) IV, oxyCODONE, pentafluoroprop-tetrafluoroeth   SUBJECTIVE: Patient is seen at bedside.  Currently receiving dialysis.  States he is feeling fine.  Review of Systems: ROS Per HPI  No Known Allergies  OBJECTIVE: Vitals:   01/19/21 0945 01/19/21 1000 01/19/21 1015 01/19/21 1030  BP: 136/66 130/66 127/65 128/63  Pulse:      Resp: '12 13 15 11  '$ Temp:      TempSrc:       SpO2: 100% 100% 100% 100%  Weight:      Height:       Body mass index is 29.03 kg/m.  Physical Exam Constitutional:      General: He is not in acute distress. HENT:     Head: Normocephalic.  Eyes:     General: No scleral icterus.       Right eye: No discharge.        Left eye: No discharge.     Conjunctiva/sclera: Conjunctivae normal.  Pulmonary:     Effort: Pulmonary effort is normal. No respiratory distress.  Musculoskeletal:     Comments: Left fistula nontender to palpation, no erythema or warmth to touch.  Neurological:     Mental Status: He is alert. Mental status is at baseline.  Psychiatric:        Mood and Affect: Mood normal.        Thought Content: Thought content normal.        Judgment: Judgment normal.    Lab Results Lab Results  Component Value Date   WBC 14.0 (H) 01/19/2021   HGB 8.9 (L) 01/19/2021   HCT 26.7 (L) 01/19/2021   MCV 95.0 01/19/2021   PLT 136 (L) 01/19/2021    Lab Results  Component Value Date   CREATININE 12.12 (H) 01/19/2021   BUN 84 (H) 01/19/2021  NA 128 (L) 01/19/2021   K 3.8 01/19/2021   CL 92 (L) 01/19/2021   CO2 18 (L) 01/19/2021    Lab Results  Component Value Date   ALT 27 01/15/2021   AST 55 (H) 01/15/2021   ALKPHOS 92 01/15/2021   BILITOT 1.3 (H) 01/15/2021     Microbiology: Recent Results (from the past 240 hour(s))  Resp Panel by RT-PCR (Flu A&B, Covid) Nasopharyngeal Swab     Status: None   Collection Time: 01/15/21  9:52 AM   Specimen: Nasopharyngeal Swab; Nasopharyngeal(NP) swabs in vial transport medium  Result Value Ref Range Status   SARS Coronavirus 2 by RT PCR NEGATIVE NEGATIVE Final    Comment: (NOTE) SARS-CoV-2 target nucleic acids are NOT DETECTED.  The SARS-CoV-2 RNA is generally detectable in upper respiratory specimens during the acute phase of infection. The lowest concentration of SARS-CoV-2 viral copies this assay can detect is 138 copies/mL. A negative result does not preclude  SARS-Cov-2 infection and should not be used as the sole basis for treatment or other patient management decisions. A negative result may occur with  improper specimen collection/handling, submission of specimen other than nasopharyngeal swab, presence of viral mutation(s) within the areas targeted by this assay, and inadequate number of viral copies(<138 copies/mL). A negative result must be combined with clinical observations, patient history, and epidemiological information. The expected result is Negative.  Fact Sheet for Patients:  EntrepreneurPulse.com.au  Fact Sheet for Healthcare Providers:  IncredibleEmployment.be  This test is no t yet approved or cleared by the [place] FDA and  has been authorized for detection and/or diagnosis of SARS-CoV-2 by FDA under an Emergency Use Authorization (EUA). This EUA will remain  in effect (meaning this test can be used) for the duration of the COVID-19 declaration under Section 564(b)(1) of the Act, 21 U.S.C.section 360bbb-3(b)(1), unless the authorization is terminated  or revoked sooner.       Influenza A by PCR NEGATIVE NEGATIVE Final   Influenza B by PCR NEGATIVE NEGATIVE Final    Comment: (NOTE) The Xpert Xpress SARS-CoV-2/FLU/RSV plus assay is intended as an aid in the diagnosis of influenza from Nasopharyngeal swab specimens and should not be used as a sole basis for treatment. Nasal washings and aspirates are unacceptable for Xpert Xpress SARS-CoV-2/FLU/RSV testing.  Fact Sheet for Patients: EntrepreneurPulse.com.au  Fact Sheet for Healthcare Providers: IncredibleEmployment.be  This test is not yet approved or cleared by the [place] FDA and has been authorized for detection and/or diagnosis of SARS-CoV-2 by FDA under an Emergency Use Authorization (EUA). This EUA will remain in effect (meaning this test can be used) for the duration of  the COVID-19 declaration under Section 564(b)(1) of the Act, 21 U.S.C. section 360bbb-3(b)(1), unless the authorization is terminated or revoked.  Performed at [facility], [place] [address], [place], [place] [address]   Culture, blood (routine x 2)     Status: Abnormal   Collection Time: 01/15/21  8:28 PM   Specimen: BLOOD RIGHT FOREARM  Result Value Ref Range Status   Specimen Description BLOOD RIGHT FOREARM  Final   Special Requests   Final    BOTTLES DRAWN AEROBIC AND ANAEROBIC Blood Culture results may not be optimal due to an inadequate volume of blood received in culture bottles   Culture  Setup Time   Final    GRAM POSITIVE COCCI IN CLUSTERS ANAEROBIC BOTTLE ONLY CRITICAL RESULT CALLED TO, READ BACK BY AND VERIFIED WITH: J. FRENS PHARMD,AT 1050 01/16/21  D. VANHOOK    Culture (A)  Final    STAPHYLOCOCCUS AUREUS SUSCEPTIBILITIES PERFORMED ON PREVIOUS CULTURE WITHIN THE LAST 5 DAYS. Performed at [facility], [place] [address], [place], [place] [address]    Report Status 01/18/2021 FINAL  Final  Culture, blood (routine x 2)     Status: Abnormal   Collection Time: 01/15/21  8:49 PM   Specimen: BLOOD  Result Value Ref Range Status   Specimen Description BLOOD RIGHT UPPER ARM  Final   Special Requests   Final    BOTTLES DRAWN AEROBIC AND ANAEROBIC Blood Culture adequate volume   Culture  Setup Time   Final    GRAM POSITIVE COCCI IN CLUSTERS IN BOTH AEROBIC AND ANAEROBIC BOTTLES CRITICAL RESULT CALLED TO, READ BACK BY AND VERIFIED WITH: J. FRENS PHARMD,AT 1050 01/16/21 [place] Performed at [facility], [place] [address] [place], [place] [address]    Culture STAPHYLOCOCCUS AUREUS (A)  Final   Report Status 01/18/2021 FINAL  Final   Organism ID, Bacteria STAPHYLOCOCCUS AUREUS  Final      Susceptibility   Staphylococcus aureus - MIC*    CIPROFLOXACIN <=0.5 SENSITIVE Sensitive     ERYTHROMYCIN <=0.25 SENSITIVE Sensitive     GENTAMICIN <=0.5 SENSITIVE Sensitive      OXACILLIN 0.5 SENSITIVE Sensitive     TETRACYCLINE <=1 SENSITIVE Sensitive     VANCOMYCIN <=0.5 SENSITIVE Sensitive     TRIMETH/SULFA <=10 SENSITIVE Sensitive     CLINDAMYCIN <=0.25 SENSITIVE Sensitive     RIFAMPIN <=0.5 SENSITIVE Sensitive     Inducible Clindamycin NEGATIVE Sensitive     * STAPHYLOCOCCUS AUREUS  Blood Culture ID Panel (Reflexed)     Status: Abnormal   Collection Time: 01/15/21  8:49 PM  Result Value Ref Range Status   Enterococcus faecalis NOT DETECTED NOT DETECTED Final   Enterococcus Faecium NOT DETECTED NOT DETECTED Final   Listeria monocytogenes NOT DETECTED NOT DETECTED Final   Staphylococcus species DETECTED (A) NOT DETECTED Final    Comment: CRITICAL RESULT CALLED TO, READ BACK BY AND VERIFIED WITH: J. FRENS PHARMD,AT 1050 01/16/21 D. VANHOOK    Staphylococcus aureus (BCID) DETECTED (A) NOT DETECTED Final    Comment: CRITICAL RESULT CALLED TO, READ BACK BY AND VERIFIED WITH: J. FRENS PHARMD,AT [id number] 01/16/21 D. VANHOOK    Staphylococcus epidermidis NOT DETECTED NOT DETECTED Final   Staphylococcus lugdunensis NOT DETECTED NOT DETECTED Final   Streptococcus species NOT DETECTED NOT DETECTED Final   Streptococcus agalactiae NOT DETECTED NOT DETECTED Final   Streptococcus pneumoniae NOT DETECTED NOT DETECTED Final   Streptococcus pyogenes NOT DETECTED NOT DETECTED Final   A.calcoaceticus-baumannii NOT DETECTED NOT DETECTED Final   Bacteroides fragilis NOT DETECTED NOT DETECTED Final   Enterobacterales NOT DETECTED NOT DETECTED Final   Enterobacter cloacae complex NOT DETECTED NOT DETECTED Final   Escherichia coli NOT DETECTED NOT DETECTED Final   Klebsiella aerogenes NOT DETECTED NOT DETECTED Final   Klebsiella oxytoca NOT DETECTED NOT DETECTED Final   Klebsiella pneumoniae NOT DETECTED NOT DETECTED Final   Proteus species NOT DETECTED NOT DETECTED Final   Salmonella species NOT DETECTED NOT DETECTED Final   Serratia marcescens NOT DETECTED NOT DETECTED Final    Haemophilus influenzae NOT DETECTED NOT DETECTED Final   Neisseria meningitidis NOT DETECTED NOT DETECTED Final   Pseudomonas aeruginosa NOT DETECTED NOT DETECTED Final   Stenotrophomonas maltophilia NOT DETECTED NOT DETECTED Final   Candida albicans NOT DETECTED NOT DETECTED Final   Candida  auris NOT DETECTED NOT DETECTED Final   Candida glabrata NOT DETECTED NOT DETECTED Final   Candida krusei NOT DETECTED NOT DETECTED Final   Candida parapsilosis NOT DETECTED NOT DETECTED Final   Candida tropicalis NOT DETECTED NOT DETECTED Final   Cryptococcus neoformans/gattii NOT DETECTED NOT DETECTED Final   Meth resistant mecA/C and MREJ NOT DETECTED NOT DETECTED Final    Comment: Performed at [facility], [place] [address] [place], [place] [address]  MRSA PCR Screening     Status: None   Collection Time: 01/16/21 12:50 AM   Specimen: Nasal Mucosa; Nasopharyngeal  Result Value Ref Range Status   MRSA by PCR NEGATIVE NEGATIVE Final    Comment:        The GeneXpert MRSA Assay (FDA approved for NASAL specimens only), is one component of a comprehensive MRSA colonization surveillance program. It is not intended to diagnose MRSA infection nor to guide or monitor treatment for MRSA infections. Performed at [facility], [place] [address] [place], [place] [address]   Gastrointestinal Panel by PCR , Stool     Status: None   Collection Time: 01/16/21  8:58 AM   Specimen: Stool  Result Value Ref Range Status   Campylobacter species NOT DETECTED NOT DETECTED Final   Plesimonas shigelloides NOT DETECTED NOT DETECTED Final   Salmonella species NOT DETECTED NOT DETECTED Final   Yersinia enterocolitica NOT DETECTED NOT DETECTED Final   Vibrio species NOT DETECTED NOT DETECTED Final   Vibrio cholerae NOT DETECTED NOT DETECTED Final   Enteroaggregative E coli (EAEC) NOT DETECTED NOT DETECTED Final   Enteropathogenic E coli (EPEC) NOT DETECTED NOT DETECTED Final   Enterotoxigenic E coli  (ETEC) NOT DETECTED NOT DETECTED Final   Shiga like toxin producing E coli (STEC) NOT DETECTED NOT DETECTED Final   Shigella/Enteroinvasive E coli (EIEC) NOT DETECTED NOT DETECTED Final   Cryptosporidium NOT DETECTED NOT DETECTED Final   Cyclospora cayetanensis NOT DETECTED NOT DETECTED Final   Entamoeba histolytica NOT DETECTED NOT DETECTED Final   Giardia lamblia NOT DETECTED NOT DETECTED Final   Adenovirus F40/41 NOT DETECTED NOT DETECTED Final   Astrovirus NOT DETECTED NOT DETECTED Final   Norovirus GI/GII NOT DETECTED NOT DETECTED Final   Rotavirus A NOT DETECTED NOT DETECTED Final   Sapovirus (I, II, IV, and V) NOT DETECTED NOT DETECTED Final    Comment: Performed at [facility], [place] [place], [place] [address]  C Difficile Quick Screen w PCR reflex     Status: Abnormal   Collection Time: 01/16/21  8:58 AM   Specimen: STOOL  Result Value Ref Range Status   C Diff antigen POSITIVE (A) NEGATIVE Final   C Diff toxin NEGATIVE NEGATIVE Final   C Diff interpretation Results are indeterminate. See PCR results.  Final    Comment: Performed at [facility], [place] [address], [place], [place] [address]  C. Diff by PCR, Reflexed     Status: Abnormal   Collection Time: 01/16/21  8:58 AM  Result Value Ref Range Status   Toxigenic C. Difficile by PCR POSITIVE (A) NEGATIVE Final    Comment: Positive for toxigenic C. difficile with little to no toxin production. Only treat if clinical presentation suggests symptomatic illness. Performed at [facility], [place] [address] [place], [place] [address]   Culture, blood (Routine X 2) w Reflex to ID Panel     Status: None (Preliminary result)   Collection Time: 01/17/21  6:59 AM   Specimen: BLOOD RIGHT HAND  Result Value Ref Range Status   Specimen Description BLOOD RIGHT HAND  Final   Special Requests   Final    BOTTLES DRAWN AEROBIC AND ANAEROBIC Blood Culture adequate volume   Culture   Final    NO GROWTH 2  DAYS Performed at [facility], [address], [place], [place] [address]    Report Status PENDING  Incomplete  Culture, blood (Routine X 2) w Reflex to ID Panel     Status: None (Preliminary result)   Collection Time: 01/17/21  6:59 AM   Specimen: BLOOD RIGHT WRIST  Result Value Ref Range Status   Specimen Description BLOOD RIGHT WRIST  Final   Special Requests   Final    BOTTLES DRAWN AEROBIC AND ANAEROBIC Blood Culture adequate volume   Culture   Final    NO GROWTH 2 DAYS Performed at [facility], [place] [address] [place], [place] [address]    Report Status PENDING  Incomplete    Gaylan Gerold, [facility] [facility] [phone] pager   [phone] cell 01/19/2021, 10:46 AM

## 2021-01-19 NOTE — Progress Notes (Signed)
NAME:  Gurinder Flaum, MRN:  [id number], DOB:  [birth year], LOS: 4 ADMISSION DATE:  01/15/2021, CONSULTATION DATE:  01/15/21 REFERRING MD:  ITMS, CHIEF COMPLAINT:  N/V   History of Present Illness:  65 year old man with PMHx significant for HTN, T2DM and ESRD (on HD MWF via LUE AVF) who presented to [facility] 6/6 with worsening nausea, vomiting x 2 days.    On presentation to ED, patient was hypotensive with AMS and fever. Hypotension initially responded to fluids but eventually required pressor initiation in the setting of presumed septic shock. Broad spectrum antibiotics were started by IMTS.    PCCM consulted for ICU transfer.  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 Per Nephro, hold on HD and resume 6/10 per usual HD schedule.  Interim History / Subjective:   Patient doing well this morning.  Tolerating dialysis blood pressure stable.  Off pressors.  Objective   Blood pressure 128/63, pulse 69, temperature 98.7 F (37.1 C), temperature source Oral, resp. rate 11, height '5\' 2"'$  (1.575 m), weight 72 kg, SpO2 100 %.        Intake/Output Summary (Last 24 hours) at 01/19/2021 1041 Last data filed at 01/19/2021 0600 Gross per 24 hour  Intake 335.57 ml  Output --  Net 335.57 ml   Filed Weights   01/16/21 1755 01/18/21 0500 01/19/21 0431  Weight: 68.5 kg 71.8 kg 72 kg   Physical Examination: General: Chronically ill-appearing elderly gentleman HEENT: NCAT, tracking appropriately Neuro: Awake alert following commands CV: Irregularly irregular,  S1-S2 PULM: Clear to auscultation bilaterally no wheeze GI: Soft, nontender nondistended Extremities: No significant edema Skin: Warm dry no rash  Resolved Hospital Problem List   Thrombocytopenia  Assessment & Plan:   MSSA bacteremia: Positive BC on 6/7 for staph aureus. CDiff: Positive on 6/7 Septic shock: Unclear when RIJ TDC was removed. Current access is LE AV fistula. P:  Afib RVR TTE negative for vegetation. CT Chest concerning for septic emboli. Plan: Continue midodrine Continue antibiotics Appreciate infectious disease input. Stable for transfer from the ICU TEE pending today Pick up tomorrow by internal medicine teaching service I have spoke with them and they are willing to pick patient up tomorrow.  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 Plan: Follow renal function Continue IHD per nephrology  Cholelithiasis: CT abdomen and abd [place] suggest no cholangitis N/V Outpatient follow-up  DM2 with hyperglycemia P: CBGs with SSI  Hx HTN -Holding home antihypertensives  Best practice (right click and "Reselect all SmartList Selections" daily)  Diet:  Oral Pain/Anxiety/Delirium protocol (if indicated): No VAP protocol (if indicated): Not indicated DVT prophylaxis: Subcutaneous Heparin GI prophylaxis: PPI Glucose control:  SSI Yes Central venous access:  N/A  Arterial line:  N/A Foley:  N/A Mobility:  bed rest  PT consulted: N/A Last date of multidisciplinary goals of care discussion [patient and wife updated at bedside 6/8 via tele- interpreter services] Code Status:  full code  Disposition: ICU   Garner Nash, DO [facility] 01/19/2021 10:47 AM

## 2021-01-19 NOTE — Progress Notes (Addendum)
[facility] NEPHROLOGY PROGRESS NOTE  Assessment/ Plan: Pt is a 65 y.o. yo male with ESRD on HD MWF at [facility], with past medical history of DM, HTN, secondary hyperparathyroidism, anemia, last HD on 6/3 who presented to the ER with fever, N/Vand diarrhea, seen as a consultation for the management of ESRD.    HD Orders: [place] MWF 3:45 hrs 180NRe 400/500 68 kg 2.0K2.0 Ca UFP 4 AVF -Heparin 2200 units IV TIW -Hectorol 4 mcg IV TIW  #Septic shock due to MSSA bacteremia: AV fistula site looks clean.  Seen by ID and currently on cefazolin.  Plan for TEE today.  Diarrhea improved and he is off of pressors today.  # ESRD: MWF: Plan for regular intermittent hemodialysis today.  He is not on pressors with acceptable blood pressure reading.  We will attempt goal UF 1 to 2 kg, discussed with the nurse.    #Hypokalemia due to diarrhea.  Required potassium repletion in the hospital.  We will do dialysis with high potassium bath.  Monitor lab.   # Anemia of ESRD: Hemoglobin dropped probably because of sepsis. Continue to monitor.  No ESA as outpatient.   #Secondary hyperparathyroidism: Phosphorus level acceptable.  Continue binders, vitamin D.  Monitor lab.   #Hyponatremia, hypervolemic: Managed with dialysis.  UF as tolerated.  Continue fluid restriction.  Subjective: Seen and examined.  Off of pressors.  Denies nausea vomiting chest pain shortness of breath.  No new event. Objective Vital signs in last 24 hours: Vitals:   01/19/21 0715 01/19/21 0800 01/19/21 0815 01/19/21 0830  BP: (!) 157/69 (!) 164/73 (!) 162/75 (!) 163/79  Pulse:      Resp: '12 12 12 '$ (!) 9  Temp:    98.7 F (37.1 C)  TempSrc:    Oral  SpO2: 100% 100% 100% 100%  Weight:      Height:       Weight change: 0.2 kg  Intake/Output Summary (Last 24 hours) at 01/19/2021 0847 Last data filed at 01/19/2021 0600 Gross per 24 hour  Intake 373.16 ml  Output --  Net 373.16 ml        Labs: Basic  Metabolic Panel: Recent Labs  Lab 01/15/21 2049 01/16/21 0103 01/16/21 1447 01/17/21 1644 01/18/21 0026 01/19/21 0446  NA 128* 133*   < > 131* 131* 128*  K 4.5 5.3*   < > 3.4* 3.8 3.8  CL 89* 99   < > 94* 94* 92*  CO2 21* 19*   < > 24 22 18*  GLUCOSE 182* 153*   < > 177* 182* 161*  BUN 95* 47*   < > 56* 61* 84*  CREATININE 14.22* 3.12*   < > 9.50* 10.25* 12.12*  CALCIUM 7.8* 8.5*   < > 7.5* 7.5* 7.8*  PHOS 4.5 8.3*  --   --   --  4.5   < > = values in this interval not displayed.    Liver Function Tests: Recent Labs  Lab 01/15/21 0612 01/15/21 2049 01/16/21 0103 01/19/21 0446  AST 42* 55*  --   --   ALT 29 27  --   --   ALKPHOS 100 92  --   --   BILITOT 1.3* 1.3*  --   --   PROT 8.2* 6.7  --   --   ALBUMIN 3.7 3.1* 3.0* 2.3*    Recent Labs  Lab 01/15/21 0612  LIPASE 33    No results for input(s): AMMONIA in the  last 168 hours. CBC: Recent Labs  Lab 01/15/21 2049 01/16/21 0103 01/16/21 1530 01/17/21 0659 01/18/21 0026 01/19/21 0446  WBC 10.0 27.9* 10.6* 10.5 11.3* 14.0*  NEUTROABS 9.0* 23.7*  --   --   --   --   HGB 11.5* 11.7* 10.4* 9.7* 9.4* 8.9*  HCT 34.3* 40.5 29.3* 28.0* 28.0* 26.7*  MCV 93.5 91.4 90.4 91.5 93.6 95.0  PLT 93* 294 76* 73* 78* 136*    Cardiac Enzymes: No results for input(s): CKTOTAL, CKMB, CKMBINDEX, TROPONINI in the last 168 hours. CBG: Recent Labs  Lab 01/18/21 0329 01/18/21 0818 01/18/21 1156 01/18/21 2340 01/19/21 0334  GLUCAP 163* 120* 167* 116* 150*     Iron Studies: No results for input(s): IRON, TIBC, TRANSFERRIN, FERRITIN in the last 72 hours. Studies/Results: No results found.   Medications: Infusions:  sodium chloride Stopped (01/17/21 0414)   sodium chloride Stopped (01/19/21 0013)   sodium chloride     sodium chloride     sodium chloride 20 mL/hr at 01/19/21 0600    ceFAZolin (ANCEF) IV Stopped (01/18/21 1752)   norepinephrine (LEVOPHED) Adult infusion Stopped (01/17/21 0451)   vasopressin Stopped  (01/18/21 1617)    Scheduled Medications:  Chlorhexidine Gluconate Cloth  6 each Topical Q0600   Chlorhexidine Gluconate Cloth  6 each Topical Q0600   doxercalciferol  4 mcg Intravenous Q M,W,F-HD   fidaxomicin  200 mg Oral BID   heparin  2,200 Units Dialysis Once in dialysis   insulin aspart  3-9 Units Subcutaneous Q4H   midodrine  10 mg Oral TID WC   multivitamin  1 tablet Oral QHS   pantoprazole (PROTONIX) IV  40 mg Intravenous Daily   sucroferric oxyhydroxide  1,000 mg Oral TID WC    have reviewed scheduled and prn medications.  Physical Exam: General:NAD, comfortable.Marland Kitchen Heart:RRR, s1s2 nl Lungs: Clear bilateral, no wheeze or crackle Abdomen:soft, Non-tender, non-distended Extremities: No LE edema Dialysis Access: AV fistula.    Latrice Storlie Prasad Christabelle Hanzlik 01/19/2021,8:47 AM  LOS: 4 days

## 2021-01-19 NOTE — Anesthesia Postprocedure Evaluation (Signed)
Anesthesia Post Note  Patient: Ryan Wells  Procedure(s) Performed: TRANSESOPHAGEAL ECHOCARDIOGRAM (TEE)     Patient location during evaluation: Endoscopy Anesthesia Type: MAC Level of consciousness: awake and alert Pain management: pain level controlled Vital Signs Assessment: post-procedure vital signs reviewed and stable Respiratory status: spontaneous breathing, nonlabored ventilation, respiratory function stable and patient connected to nasal cannula oxygen Cardiovascular status: stable and blood pressure returned to baseline Postop Assessment: no apparent nausea or vomiting Anesthetic complications: no   No notable events documented.  Last Vitals:  Vitals:   01/19/21 1500 01/19/21 1524  BP: (!) 116/40 133/61  Pulse: 63 64  Resp: 11 15  Temp:  36.9 C  SpO2: 99% 100%    Last Pain:  Vitals:   01/19/21 1524  TempSrc: Oral  PainSc: 0-No pain                 Pegi Milazzo,W. EDMOND

## 2021-01-20 ENCOUNTER — Inpatient Hospital Stay (HOSPITAL_COMMUNITY): Payer: Medicare HMO

## 2021-01-20 DIAGNOSIS — A4902 Methicillin resistant Staphylococcus aureus infection, unspecified site: Secondary | ICD-10-CM

## 2021-01-20 DIAGNOSIS — M7989 Other specified soft tissue disorders: Secondary | ICD-10-CM

## 2021-01-20 LAB — CBC WITH DIFFERENTIAL/PLATELET
Abs Immature Granulocytes: 0.33 10*3/uL — ABNORMAL HIGH (ref 0.00–0.07)
Basophils Absolute: 0.1 10*3/uL (ref 0.0–0.1)
Basophils Relative: 0 %
Eosinophils Absolute: 0.1 10*3/uL (ref 0.0–0.5)
Eosinophils Relative: 1 %
HCT: 27.6 % — ABNORMAL LOW (ref 39.0–52.0)
Hemoglobin: 9 g/dL — ABNORMAL LOW (ref 13.0–17.0)
Immature Granulocytes: 2 %
Lymphocytes Relative: 11 %
Lymphs Abs: 1.6 10*3/uL (ref 0.7–4.0)
MCH: 31.6 pg (ref 26.0–34.0)
MCHC: 32.6 g/dL (ref 30.0–36.0)
MCV: 96.8 fL (ref 80.0–100.0)
Monocytes Absolute: 1.2 10*3/uL — ABNORMAL HIGH (ref 0.1–1.0)
Monocytes Relative: 8 %
Neutro Abs: 11.3 10*3/uL — ABNORMAL HIGH (ref 1.7–7.7)
Neutrophils Relative %: 78 %
Platelets: 211 10*3/uL (ref 150–400)
RBC: 2.85 MIL/uL — ABNORMAL LOW (ref 4.22–5.81)
RDW: 13.8 % (ref 11.5–15.5)
WBC: 14.6 10*3/uL — ABNORMAL HIGH (ref 4.0–10.5)
nRBC: 0 % (ref 0.0–0.2)

## 2021-01-20 LAB — GLUCOSE, CAPILLARY
Glucose-Capillary: 105 mg/dL — ABNORMAL HIGH (ref 70–99)
Glucose-Capillary: 130 mg/dL — ABNORMAL HIGH (ref 70–99)
Glucose-Capillary: 135 mg/dL — ABNORMAL HIGH (ref 70–99)
Glucose-Capillary: 151 mg/dL — ABNORMAL HIGH (ref 70–99)
Glucose-Capillary: 154 mg/dL — ABNORMAL HIGH (ref 70–99)
Glucose-Capillary: 244 mg/dL — ABNORMAL HIGH (ref 70–99)

## 2021-01-20 LAB — RENAL FUNCTION PANEL
Albumin: 2.3 g/dL — ABNORMAL LOW (ref 3.5–5.0)
Anion gap: 14 (ref 5–15)
BUN: 51 mg/dL — ABNORMAL HIGH (ref 8–23)
CO2: 23 mmol/L (ref 22–32)
Calcium: 7.9 mg/dL — ABNORMAL LOW (ref 8.9–10.3)
Chloride: 96 mmol/L — ABNORMAL LOW (ref 98–111)
Creatinine, Ser: 7.87 mg/dL — ABNORMAL HIGH (ref 0.61–1.24)
GFR, Estimated: 7 mL/min — ABNORMAL LOW (ref >60)
Glucose, Bld: 152 mg/dL — ABNORMAL HIGH (ref 70–99)
Phosphorus: 3.5 mg/dL (ref 2.5–4.6)
Potassium: 3.6 mmol/L (ref 3.5–5.1)
Sodium: 133 mmol/L — ABNORMAL LOW (ref 135–145)

## 2021-01-20 LAB — MAGNESIUM: Magnesium: 2.7 mg/dL — ABNORMAL HIGH (ref 1.7–2.4)

## 2021-01-20 IMAGING — CT CT EXTREM UP ENTIRE ARM*L* W/O CM
2 of 3 series · 11 of 36 positions shown, 13 images · non-contrast
Comparison: None.
COMPARISON: None.

Addendum:
CLINICAL DATA: Soft tissue infection or underlying abscess . Not
improving with antibiotics.

EXAM:
CT OF THE UPPER LEFT EXTREMITY WITHOUT CONTRAST
TECHNIQUE: Multidetector CT imaging of the upper left extremity was performed
according to the standard protocol.

[Series 4: extremity soft tissue · axial · 0.44mm/px · z∈[+20,+534]mm · 8 of 305 slices shown, 10 images]
[im 24/305  soft-tissue]
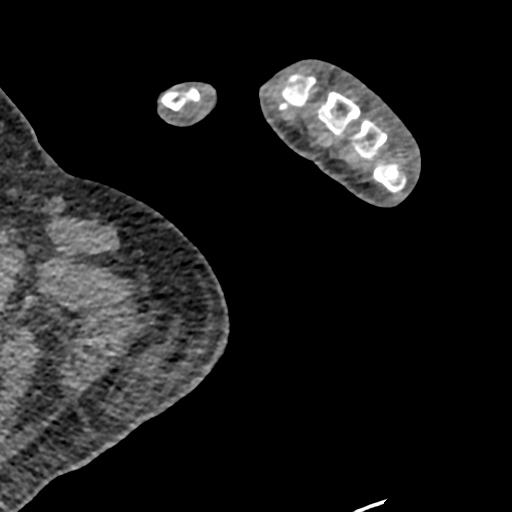
[im 24/305  bone]
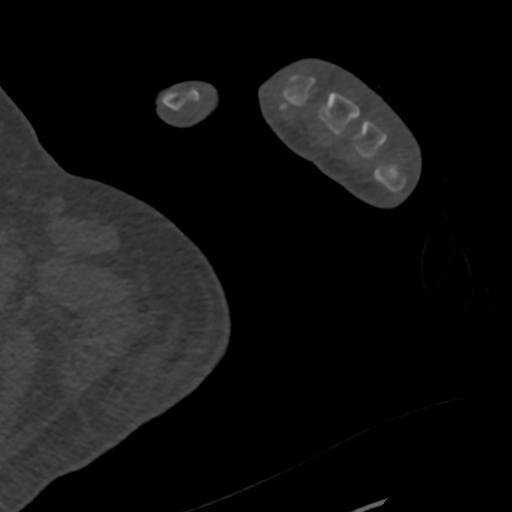
[im 71/305  bone]
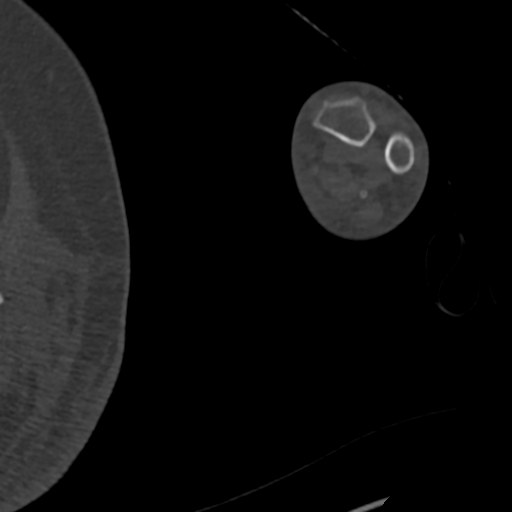
[im 94/305  bone]
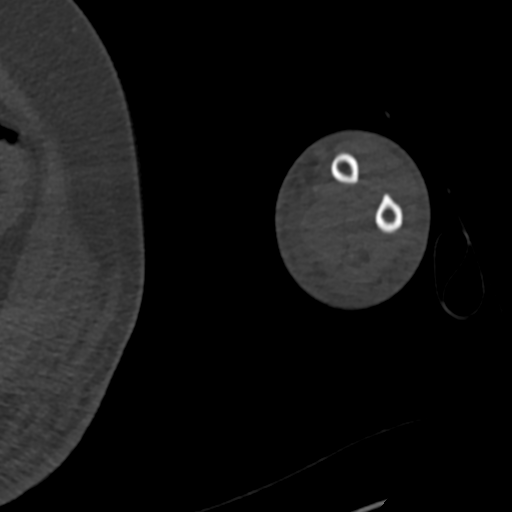
[im 141/305  bone]
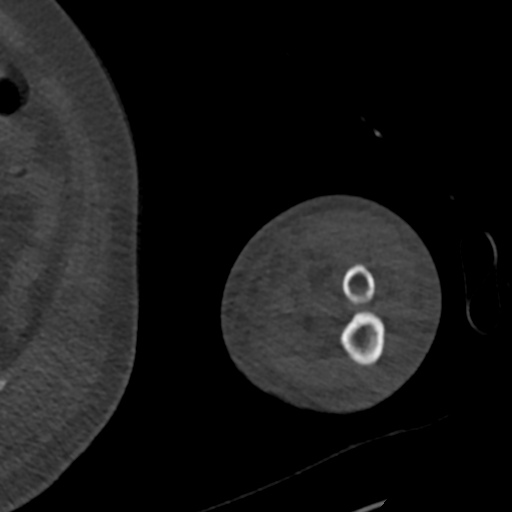
[im 164/305  soft-tissue]
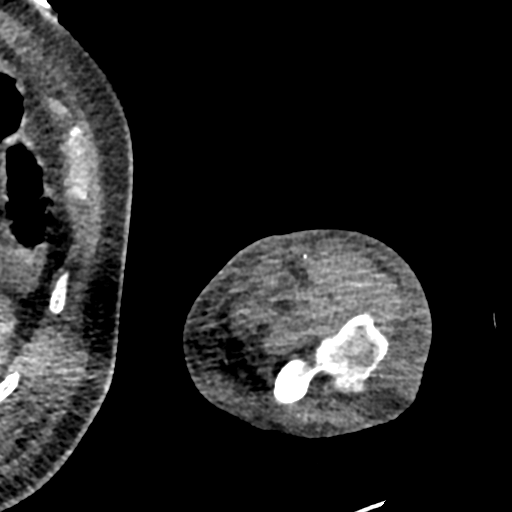
[im 164/305  bone]
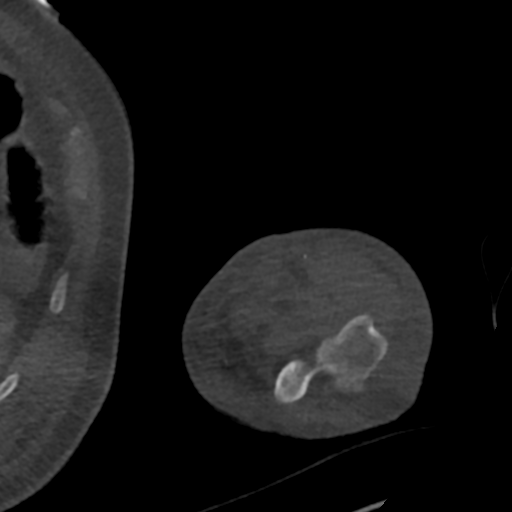
[im 211/305  bone]
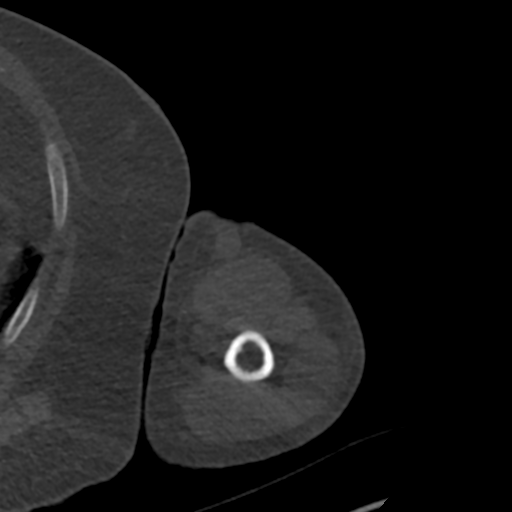
[im 234/305  bone]
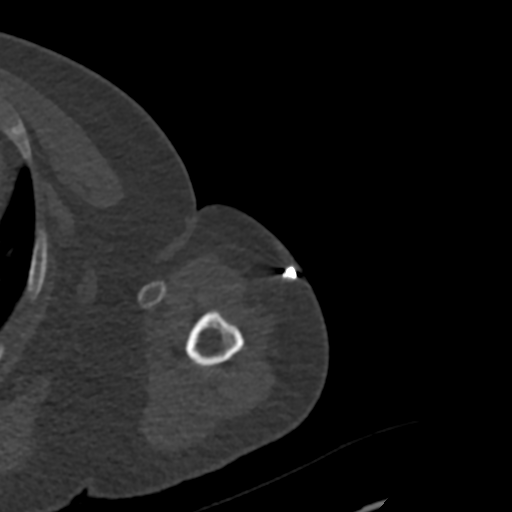
[im 281/305  bone]
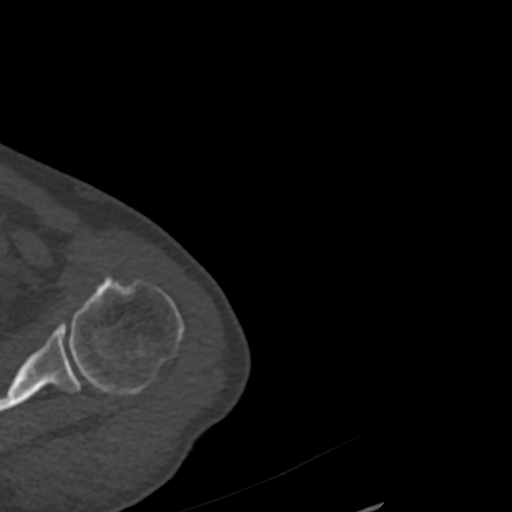

[Series 7: cor soft tissue · coronal · 0.41mm/px · 3 of 171 slices shown]
[im 57/171  bone]
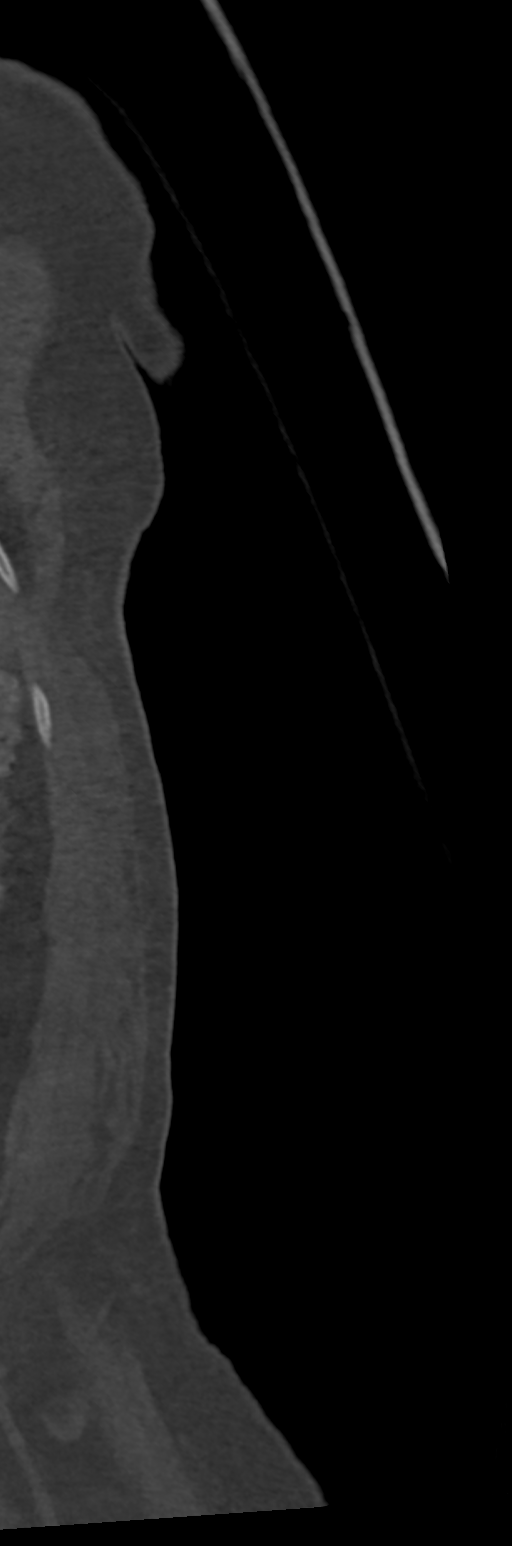
[im 77/171  bone]
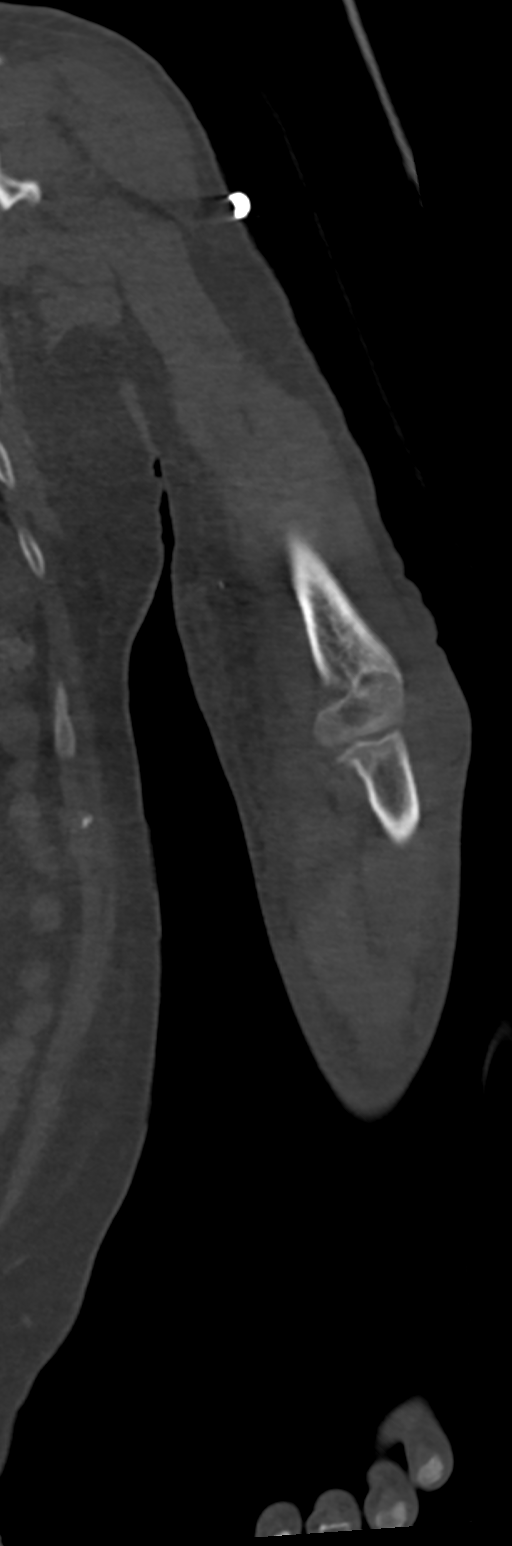
[im 97/171  bone]
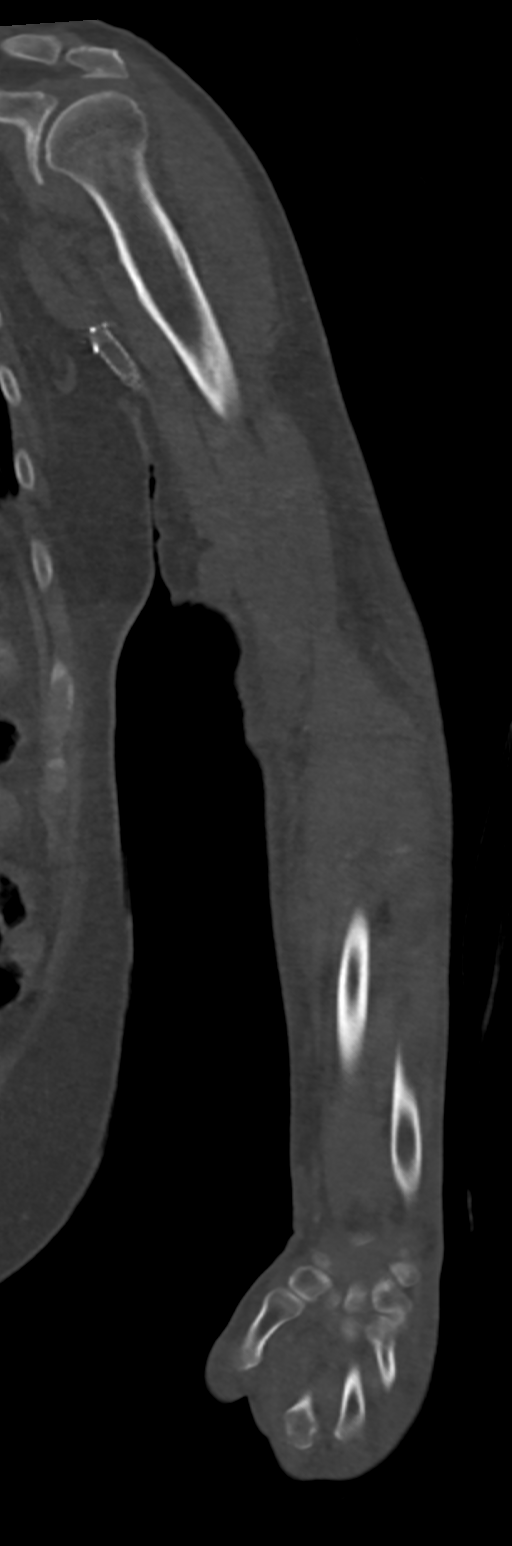

[11 of 36 positions shown; findings below may reference images not displayed]

FINDINGS: Bones/Joint/Cartilage

No cortical erosion or destruction. No acute displaced fracture or
dislocation of the visualized bones of the left upper extremity.

Ligaments

Suboptimally assessed by CT.

Muscles and Tendons

Grossly unremarkable.

Soft tissues

Diffuse distal arm and forearm subcutaneus soft tissue edema with
mild dermal thickening along the volar aspect of the elbow. No
organized fluid collection. No subcutaneus soft tissue emphysema.
There is a soft tissue defect along the distal left forearm
measuring approximately 3 cm ([DATE]).

Vascular: Left arteriovenous fistula and vascular stent poorly
evaluated on this noncontrast study.
IMPRESSION: 1. Subcutaneus soft tissue edema and dermal thickening with no
associated organized fluid.
2. No acute osseous abnormality.

ADDENDUM:
There is an incidental 5.2 cm wire-like radiopaque foreign body in
the subcutaneous fat of the left anterior upper arm, possibly an
embolization coil, well superior to the soft tissue infection around
the left elbow. There is no fluid collection or inflammatory change
surrounding the foreign body.

*** End of Addendum ***
FINDINGS: Bones/Joint/Cartilage

No cortical erosion or destruction. No acute displaced fracture or
dislocation of the visualized bones of the left upper extremity.

Ligaments

Suboptimally assessed by CT.

Muscles and Tendons

Grossly unremarkable.

Soft tissues

Diffuse distal arm and forearm subcutaneus soft tissue edema with
mild dermal thickening along the volar aspect of the elbow. No
organized fluid collection. No subcutaneus soft tissue emphysema.
There is a soft tissue defect along the distal left forearm
measuring approximately 3 cm ([DATE]).

Vascular: Left arteriovenous fistula and vascular stent poorly
evaluated on this noncontrast study.
IMPRESSION: 1. Subcutaneus soft tissue edema and dermal thickening with no
associated organized fluid.
2. No acute osseous abnormality.

## 2021-01-20 NOTE — Progress Notes (Signed)
VASCULAR LAB    Left upper extremity venous duplex has been performed.  See CV proc for preliminary results.   Kaulder Zahner, RVT 01/20/2021, 2:21 PM

## 2021-01-20 NOTE — Progress Notes (Signed)
Ryan Wells  Summary Pt is a 66 y.o. yo male with ESRD on HD MWF at [facility], with past medical history of DM, HTN, secondary hyperparathyroidism, anemia, last HD on 6/3 who presented to the ER with fever, N/Vand diarrhea, seen as a consultation for the management of ESRD.    HD Orders: [facility] M[facility] 3h 33mn   400/500   68kg   2/2 bath  P4  AVF  Hep 2200 -Hectorol 4 mcg IV TIW  Assessment/ Plan: Septic shock/ MSSA bacteremia: AV fistula site looks clean.  Seen by ID and currently on cefazolin. SP TEE 6/10. Shock resolved.  ESRD: MWF HD. Had HD yest. Next HD Monday.  Hypokalemia - resolved.  Anemia of ESRD: Hemoglobin dropped possibly d/t sepsis. Continue to monitor.  No ESA as outpatient. Secondary hyperparathyroidism: Phosphorus level acceptable.  Continue binders, vitamin D.  Monitor lab.  Hyponatremia, hypervolemic: Managed with dialysis.  UF as tolerated.  Continue fluid restriction. Cdif diarrhea - per pmd / ID   RKelly Splinter MD 01/20/2021, 2:18 PM    Subjective: Seen in room.  No c/o today. Spoke w/ wife and a son on the phone.    Physical Exam: General:NAD, comfortable..Marland KitchenHeart:RRR, s1s2 nl Lungs: Clear bilateral, no wheeze or crackle Abdomen:soft, Non-tender, non-distended Extremities: No LE edema Dialysis Access: AV fistula.     Objective Vital signs in last 24 hours: Vitals:   01/19/21 2008 01/20/21 0202 01/20/21 0357 01/20/21 0402  BP: (!) 98/50 (!) 129/51  (!) 142/54  Pulse: 62 70  70  Resp: '17 17  17  '$ Temp: 99.2 F (37.3 C) (!) 100.4 F (38 C)  99.7 F (37.6 C)  TempSrc: Oral Oral  Oral  SpO2: 95% 100%  99%  Weight:   71.1 kg   Height:       Weight change: -2 kg  Intake/Output Summary (Last 24 hours) at 01/20/2021 1415 Last data filed at 01/19/2021 1425 Gross per 24 hour  Intake 400 ml  Output --  Net 400 ml        Labs: Basic Metabolic Panel: Recent Labs  Lab 01/16/21 0103 01/16/21 1447  01/18/21 0026 01/19/21 0446 01/20/21 0856  NA 133*   < > 131* 128* 133*  K 5.3*   < > 3.8 3.8 3.6  CL 99   < > 94* 92* 96*  CO2 19*   < > 22 18* 23  GLUCOSE 153*   < > 182* 161* 152*  BUN 47*   < > 61* 84* 51*  CREATININE 3.12*   < > 10.25* 12.12* 7.87*  CALCIUM 8.5*   < > 7.5* 7.8* 7.9*  PHOS 8.3*  --   --  4.5 3.5   < > = values in this interval not displayed.    Liver Function Tests: Recent Labs  Lab 01/15/21 0612 01/15/21 2049 01/16/21 0103 01/19/21 0446 01/20/21 0856  AST 42* 55*  --   --   --   ALT 29 27  --   --   --   ALKPHOS 100 92  --   --   --   BILITOT 1.3* 1.3*  --   --   --   PROT 8.2* 6.7  --   --   --   ALBUMIN 3.7 3.1* 3.0* 2.3* 2.3*    Recent Labs  Lab 01/15/21 0612  LIPASE 33    No results for input(s): AMMONIA in the last 168 hours. CBC: Recent  Labs  Lab 01/15/21 2049 01/16/21 0103 01/16/21 1530 01/17/21 0659 01/18/21 0026 01/19/21 0446 01/20/21 0856  WBC 10.0 27.9* 10.6* 10.5 11.3* 14.0* 14.6*  NEUTROABS 9.0* 23.7*  --   --   --   --  11.3*  HGB 11.5* 11.7* 10.4* 9.7* 9.4* 8.9* 9.0*  HCT 34.3* 40.5 29.3* 28.0* 28.0* 26.7* 27.6*  MCV 93.5 91.4 90.4 91.5 93.6 95.0 96.8  PLT 93* 294 76* 73* 78* 136* 211    Cardiac Enzymes: No results for input(s): CKTOTAL, CKMB, CKMBINDEX, TROPONINI in the last 168 hours. CBG: Recent Labs  Lab 01/19/21 2000 01/20/21 0000 01/20/21 0355 01/20/21 0818 01/20/21 1202  GLUCAP 214* 244* 130* 135* 151*      Medications: Infusions:  sodium chloride Stopped (01/17/21 0414)   sodium chloride Stopped (01/19/21 0013)    ceFAZolin (ANCEF) IV 1 g (01/19/21 1815)    Scheduled Medications:  Chlorhexidine Gluconate Cloth  6 each Topical Q0600   Chlorhexidine Gluconate Cloth  6 each Topical Q0600   doxercalciferol  4 mcg Intravenous Q M,W,F-HD   fidaxomicin  200 mg Oral BID   heparin  2,200 Units Dialysis Once in dialysis   insulin aspart  3-9 Units Subcutaneous Q4H   midodrine  10 mg Oral TID WC    multivitamin  1 tablet Oral QHS   pantoprazole (PROTONIX) IV  40 mg Intravenous Daily   sucroferric oxyhydroxide  1,000 mg Oral TID WC    have reviewed scheduled and prn medications.

## 2021-01-20 NOTE — Hospital Course (Addendum)
This patient's plan of care was discussed with the house staff. Please see their note for complete details.  Unfortunately, patient required transfer to the ICU and service was transferred to [facility] yesterday.  In brief, patient is a 65-year-old male with a past medical history of ESRD on hemodialysis, diet-controlled type 2 diabetes, anemia of chronic disease, hypertension who presented to the ED with nausea and vomiting and associated diarrhea over the last 3 days.  At the time of admission he denied any fevers or chills or abdominal pain.  Patient has had poor p.o. intake since the onset of symptoms and his last meal was approximately 2 days prior to admission.  No sick contacts.  No new medications.       Patient was initially suspected to have a viral gastroenteritis and was admitted for dehydration and poor oral intake.  He did have a mildly elevated white count on admission up to 15.3 but his CT abdomen/pelvis did not show any acute pathology.  He was also noted to be hyponatremic with sodium down to 125 on admission likely hypovolemic secondary to decreased oral intake.  Overnight, patient developed high fevers (up to 104.1 F) and was noted to have mild lactic acidosis.  He also had an episode of nonsustained V. tach.  He was evaluated by the resident at that time.  Blood cultures were drawn and he was given a 500 cc bolus.  He was also started on broad-spectrum antibiotics.  Repeat chest x-ray was done which showed patchy lower lobe opacities concerning for multifocal pneumonia.  He became hypotensive later that night and required pressors and was transferred to the ICU for further evaluation.     Blood cultures have now grown MSSA bacteremia.  ID follow-up and recommendations appreciated.  We will continue with IV Ancef for now.  The source of his bacteremia remains uncertain at this time but may be secondary to his fistula.  Leukocytosis worsened today to 27.9 but patient's lactic acidosis has resolved.   We will defer further management to ICU team and resume care once patient is transferred out of the ICU.

## 2021-01-20 NOTE — Progress Notes (Addendum)
Asked to check on Ryan Wells because he was experiencing increased left arm pain. He was having mild arm pain this morning during the day teams rounds and they ordered left upper extremity venous duplex.  This note study was negative for deep vein thrombosis or superficial vein thrombosis. On exam he has edema throughout his left arm and erythema from slightly above his elbow down to his wrist.  Patient left wrist bands are becoming restrictive and they were cut off.  His distal pulses and sensation are intact.  Normal range of motion at shoulder, abnormal range of motion at elbow.  Patient cannot bend elbow more than 5 degrees.  Will obtain CT of left arm looking for abscess.  Patient is on Cefazolin. Patient can not keep arm elevated, although I believe he would benefit from this intervention. Discussed with RN and ask for nursing to keep check of left radial pulse and test sensation on their exams. Notify MD if patient worsens. Will obtain  CT of left arm looking for abscess.

## 2021-01-20 NOTE — Progress Notes (Signed)
Notified Dr. Charleen Kirks of increased swelling and redness to his left arm. Patient reports increased pain and unable to move his extremity. Per MD will send a provider to assess. Will continue to monitor.

## 2021-01-20 NOTE — Progress Notes (Signed)
Subjective:  Interval History: IMTS resuming care after patient was transferred to ICU on 6/6 for septic shock requiring pressors. Blood cultures grew MSSA bacteremia with source of infection thought to be his fistula. ID is now following. IV cefazolin was started on 6/7. He was started on 10-day course of fidaxomicin on 6/7 for questionable c.diff infection (PCR positive but negative for toxin). Repeat blood cultures were taken on 6/8 and 6/10 with no growth to date. Patient improved clinically with resolution of diarrhea, fever, and weaned off of pressors with MAP remaining >65.   MRI spine 6/8 was negative for osteomyelitis/discitis, but showed questionable findings of septic arthritis at L5-S1. PCCM thought these were chronic changes, no follow-up needed. CT chest on 6/8 revealed septic emboli. TEE on 6/10 was negative for valvular vegetations.   Overnight Events: Patient had a low grade fever of 100.4. No other overnight events.   History was obtained with the help of a video interpretor.  This morning, Mr. Keane Scrape states he is feeling a bit worse today with increasing back pain. The pain started 1 week ago but seems worse today. The pain is centrally located. Per patient's son, he was experiencing full body aches yesterday with pain on movement. This morning, patient also was having tremors in right hand, but attributed this to not eating yet today. He reports feeling tired and is intermittently somnolent during interview.   Objective:  Vital signs in last 24 hours: Vitals:   01/19/21 2008 01/20/21 0202 01/20/21 0357 01/20/21 0402  BP: (!) 98/50 (!) 129/51  (!) 142/54  Pulse: 62 70  70  Resp: '17 17  17  '$ Temp: 99.2 F (37.3 C) (!) 100.4 F (38 C)  99.7 F (37.6 C)  TempSrc: Oral Oral  Oral  SpO2: 95% 100%  99%  Weight:   71.1 kg   Height:       Weight change: -2 kg  Intake/Output Summary (Last 24 hours) at 01/20/2021 0845 Last data filed at 01/19/2021 1425 Gross per 24  hour  Intake 519.97 ml  Output 2000 ml  Net -1480.03 ml   Physical Exam: General: Tired-appearing gentleman resting uncomfortably in bed HENT: Normocephalic, atraumatic.  CV: Regular rate with irregularly irregular rhythm. No murmurs, rubs, or gallops Pulm: Lungs CTAB with no wheezes or crackles. No increased work of breathing Abd: Nondistended, normoactive bowel sounds MSK: R hand tremor throughout interview. L forearm swollen, erythematous, and warm to touch.LUE fistula covered in clean bandage with no drainage Neuro: Falls asleep at times, very easily arousable. Oriented to place, time, situation (forgot president's name, unable to add 40 cents) Psych: Appropriate affect and thought process intact  Labs: CBC Latest Ref Rng & Units 01/20/2021 01/19/2021 01/18/2021  WBC 4.0 - 10.5 K/uL 14.6(H) 14.0(H) 11.3(H)  Hemoglobin 13.0 - 17.0 g/dL 9.0(L) 8.9(L) 9.4(L)  Hematocrit 39.0 - 52.0 % 27.6(L) 26.7(L) 28.0(L)  Platelets 150 - 400 K/uL 211 136(L) 78(L)    BMP Latest Ref Rng & Units 01/20/2021 01/19/2021 01/18/2021  Glucose 70 - 99 mg/dL 152(H) 161(H) 182(H)  BUN 8 - 23 mg/dL 51(H) 84(H) 61(H)  Creatinine 0.61 - 1.24 mg/dL 7.87(H) 12.12(H) 10.25(H)  BUN/Creat Ratio 10 - 24 - - -  Sodium 135 - 145 mmol/L 133(L) 128(L) 131(L)  Potassium 3.5 - 5.1 mmol/L 3.6 3.8 3.8  Chloride 98 - 111 mmol/L 96(L) 92(L) 94(L)  CO2 22 - 32 mmol/L 23 18(L) 22  Calcium 8.9 - 10.3 mg/dL 7.9(L) 7.8(L) 7.5(L)  Component Ref Range & Units 08:56  (01/20/21) 1 d ago  (01/19/21)  Magnesium 1.7 - 2.4 mg/dL 2.7 High   2.8 High     [place] Upper extremity venous duplex 01/20/21: Preliminary read: No evidence of DVT or superficial vein thrombosis in the Left upper extremity. R: no evidence of thrombosis in the subclavian.   Assessment/Plan:  Active Problems:   Intractable nausea and vomiting   Sepsis ([place])   Shock ([place])   MSSA bacteremia   ESRD needing dialysis ([place])   Gallstones   Nausea and vomiting in adult    Septic shock ([facility])   C. difficile diarrhea  Mr. Keane Scrape is a 65 year old Spanish-speaking gentleman with a past medical history of htn, DM2, ESRD on HD(MWF) who was admitted with septic shock and found to have MSSA bacteremia with suspected source LUE AV fistula.   #MSSA Bacteremia #Septic shock #c.Diff Patient had low-grade fever to 100.4 last night with white count trending up to 14.6. He appears uncomfortable on exam and reports worsening back pain and fatigue, although his mental status is intact and pressures have been stable. TEE was negative for valvular vegetations. ID weekend cover recommended continued antibiotics x6 weeks given evidence of septic emboli on chest CT. While he is having worsening back pain and MRI could not rule out L5-S1 septic arthritis, no further workup is indicated at this time as there would be no difference in management. LUE remains swollen and warm to touch, venous doppler US was negative today. Will continue to monitor fistula.  - Appreciate ongoing ID involvement and recommendations  - Continue cefazolin IV Start: 6/7 - Continue fidaxomicin Start: 6/7 End: 6/17 - Continue midodrine - Continue to trend CBC - NGTD on blood cultures from 6/8 and 6/10, will continue to monitor   #Afib  His heart rates have been intermittently elevated to 120s, could be secondary to Afib.  - Cardiac monitoring   #ESRD on HD HD scheduled for Monday, 10/13.  - Appreciate nephrology involvement and management of HD   LOS: 5 days   Theodosia Blender, Medical Student 01/20/2021, 8:45 AM

## 2021-01-21 DIAGNOSIS — R600 Localized edema: Secondary | ICD-10-CM

## 2021-01-21 DIAGNOSIS — M7989 Other specified soft tissue disorders: Secondary | ICD-10-CM

## 2021-01-21 DIAGNOSIS — K8020 Calculus of gallbladder without cholecystitis without obstruction: Secondary | ICD-10-CM

## 2021-01-21 DIAGNOSIS — G9341 Metabolic encephalopathy: Secondary | ICD-10-CM

## 2021-01-21 DIAGNOSIS — A4901 Methicillin susceptible Staphylococcus aureus infection, unspecified site: Secondary | ICD-10-CM

## 2021-01-21 DIAGNOSIS — A4101 Sepsis due to Methicillin susceptible Staphylococcus aureus: Principal | ICD-10-CM

## 2021-01-21 DIAGNOSIS — T827XXD Infection and inflammatory reaction due to other cardiac and vascular devices, implants and grafts, subsequent encounter: Secondary | ICD-10-CM

## 2021-01-21 DIAGNOSIS — M25551 Pain in right hip: Secondary | ICD-10-CM

## 2021-01-21 DIAGNOSIS — M5441 Lumbago with sciatica, right side: Secondary | ICD-10-CM

## 2021-01-21 LAB — GLUCOSE, CAPILLARY
Glucose-Capillary: 119 mg/dL — ABNORMAL HIGH (ref 70–99)
Glucose-Capillary: 158 mg/dL — ABNORMAL HIGH (ref 70–99)
Glucose-Capillary: 159 mg/dL — ABNORMAL HIGH (ref 70–99)
Glucose-Capillary: 164 mg/dL — ABNORMAL HIGH (ref 70–99)
Glucose-Capillary: 179 mg/dL — ABNORMAL HIGH (ref 70–99)
Glucose-Capillary: 187 mg/dL — ABNORMAL HIGH (ref 70–99)
Glucose-Capillary: 208 mg/dL — ABNORMAL HIGH (ref 70–99)
Glucose-Capillary: 77 mg/dL (ref 70–99)
Glucose-Capillary: 88 mg/dL (ref 70–99)

## 2021-01-21 LAB — CBC WITH DIFFERENTIAL/PLATELET
Abs Immature Granulocytes: 0.55 10*3/uL — ABNORMAL HIGH (ref 0.00–0.07)
Basophils Absolute: 0.1 10*3/uL (ref 0.0–0.1)
Basophils Relative: 1 %
Eosinophils Absolute: 0.2 10*3/uL (ref 0.0–0.5)
Eosinophils Relative: 1 %
HCT: 26.5 % — ABNORMAL LOW (ref 39.0–52.0)
Hemoglobin: 8.8 g/dL — ABNORMAL LOW (ref 13.0–17.0)
Immature Granulocytes: 3 %
Lymphocytes Relative: 12 %
Lymphs Abs: 2 10*3/uL (ref 0.7–4.0)
MCH: 32.1 pg (ref 26.0–34.0)
MCHC: 33.2 g/dL (ref 30.0–36.0)
MCV: 96.7 fL (ref 80.0–100.0)
Monocytes Absolute: 1.3 10*3/uL — ABNORMAL HIGH (ref 0.1–1.0)
Monocytes Relative: 8 %
Neutro Abs: 12.4 10*3/uL — ABNORMAL HIGH (ref 1.7–7.7)
Neutrophils Relative %: 75 %
Platelets: 252 10*3/uL (ref 150–400)
RBC: 2.74 MIL/uL — ABNORMAL LOW (ref 4.22–5.81)
RDW: 13.8 % (ref 11.5–15.5)
WBC: 16.6 10*3/uL — ABNORMAL HIGH (ref 4.0–10.5)
nRBC: 0 % (ref 0.0–0.2)

## 2021-01-21 LAB — RENAL FUNCTION PANEL
Albumin: 2.1 g/dL — ABNORMAL LOW (ref 3.5–5.0)
Anion gap: 12 (ref 5–15)
BUN: 60 mg/dL — ABNORMAL HIGH (ref 8–23)
CO2: 25 mmol/L (ref 22–32)
Calcium: 8 mg/dL — ABNORMAL LOW (ref 8.9–10.3)
Chloride: 96 mmol/L — ABNORMAL LOW (ref 98–111)
Creatinine, Ser: 9.07 mg/dL — ABNORMAL HIGH (ref 0.61–1.24)
GFR, Estimated: 6 mL/min — ABNORMAL LOW (ref >60)
Glucose, Bld: 85 mg/dL (ref 70–99)
Phosphorus: 4.5 mg/dL (ref 2.5–4.6)
Potassium: 3.7 mmol/L (ref 3.5–5.1)
Sodium: 133 mmol/L — ABNORMAL LOW (ref 135–145)

## 2021-01-21 LAB — CK: Total CK: 58 U/L (ref 49–397)

## 2021-01-21 NOTE — Progress Notes (Addendum)
Subjective: Complaining of severe low back pain radiating down his right leg as well as left arm pain throughout his entire left arm   Antibiotics:  Anti-infectives (From admission, onward)    Start     Dose/Rate Route Frequency Ordered Stop   01/16/21 2200  fidaxomicin (DIFICID) tablet 200 mg        200 mg Oral 2 times daily 01/16/21 1544 01/26/21 2159   01/16/21 1800  ceFAZolin (ANCEF) IVPB 1 g/50 mL premix        1 g 100 mL/hr over 30 Minutes Intravenous Every 24 hours 01/16/21 1057     01/16/21 1200  vancomycin (VANCOCIN) IVPB 750 mg/150 ml premix  Status:  Discontinued        750 mg 150 mL/hr over 60 Minutes Intravenous Every T-Th-Sa (Hemodialysis) 01/15/21 2237 01/16/21 1057   01/15/21 2200  ceFEPIme (MAXIPIME) 1 g in sodium chloride 0.9 % 100 mL IVPB  Status:  Discontinued        1 g 200 mL/hr over 30 Minutes Intravenous Every 24 hours 01/15/21 2132 01/16/21 1057   01/15/21 2200  vancomycin (VANCOREADY) IVPB 1250 mg/250 mL        1,250 mg 166.7 mL/hr over 90 Minutes Intravenous  Once 01/15/21 2132 01/15/21 2354   01/15/21 2115  metroNIDAZOLE (FLAGYL) IVPB 500 mg  Status:  Discontinued        500 mg 100 mL/hr over 60 Minutes Intravenous Every 8 hours 01/15/21 2109 01/16/21 1100       Medications: Scheduled Meds:  Chlorhexidine Gluconate Cloth  6 each Topical Q0600   Chlorhexidine Gluconate Cloth  6 each Topical Q0600   doxercalciferol  4 mcg Intravenous Q M,W,F-HD   fidaxomicin  200 mg Oral BID   heparin  2,200 Units Dialysis Once in dialysis   insulin aspart  3-9 Units Subcutaneous Q4H   midodrine  10 mg Oral TID WC   multivitamin  1 tablet Oral QHS   sucroferric oxyhydroxide  1,000 mg Oral TID WC   Continuous Infusions:  sodium chloride Stopped (01/17/21 0414)   sodium chloride Stopped (01/19/21 0013)    ceFAZolin (ANCEF) IV 1 g (01/20/21 1748)   PRN Meds:.acetaminophen **OR** acetaminophen, alteplase, heparin, heparin, lidocaine (PF),  lidocaine-prilocaine, ondansetron **OR** ondansetron (ZOFRAN) IV, oxyCODONE, pentafluoroprop-tetrafluoroeth    Objective: Weight change:  No intake or output data in the 24 hours ending 01/21/21 1257 Blood pressure (!) 120/54, pulse 66, temperature 98.1 F (36.7 C), temperature source Oral, resp. rate 17, height '5\' 2"'$  (1.575 m), weight 71.1 kg, SpO2 100 %. Temp:  [98.1 F (36.7 C)-98.9 F (37.2 C)] 98.1 F (36.7 C) (06/12 0434) Pulse Rate:  [66-70] 66 (06/12 0434) Resp:  [17-18] 17 (06/12 0434) BP: (120-158)/(52-54) 120/54 (06/12 0434) SpO2:  [99 %-100 %] 100 % (06/12 0434)  Physical Exam: Physical Exam Constitutional:      Appearance: He is well-developed.  HENT:     Head: Normocephalic and atraumatic.  Eyes:     Conjunctiva/sclera: Conjunctivae normal.  Cardiovascular:     Rate and Rhythm: Normal rate and regular rhythm.     Heart sounds: No murmur heard.   No friction rub. No gallop.  Pulmonary:     Effort: Pulmonary effort is normal. No respiratory distress.     Breath sounds: Normal breath sounds. No stridor. No wheezing or rhonchi.  Abdominal:     General: Bowel sounds are normal. There is no distension.     Palpations:  Abdomen is soft. There is no mass.  Musculoskeletal:        General: Swelling and tenderness present.     Left upper arm: Swelling, edema and tenderness present.     Left elbow: Swelling present. Tenderness present.     Left forearm: Swelling, edema and tenderness present.     Cervical back: Normal range of motion and neck supple.     Right hip: No deformity.     Comments: He has pain with external rotation of the hip and pain with palpation the gluteus maximus but no fluctuance, clear back pain with lowering and raising leg   Skin:    General: Skin is warm and dry.     Findings: No erythema or rash.  Neurological:     General: No focal deficit present.     Mental Status: He is alert and oriented to person, place, and time.  Psychiatric:         Mood and Affect: Mood normal.        Behavior: Behavior normal.        Thought Content: Thought content normal.        Judgment: Judgment normal.     CBC:    BMET Recent Labs    01/20/21 0856 01/21/21 0132  NA 133* 133*  K 3.6 3.7  CL 96* 96*  CO2 23 25  GLUCOSE 152* 85  BUN 51* 60*  CREATININE 7.87* 9.07*  CALCIUM 7.9* 8.0*     Liver Panel  Recent Labs    01/20/21 0856 01/21/21 0132  ALBUMIN 2.3* 2.1*       Sedimentation Rate No results for input(s): ESRSEDRATE in the last 72 hours. C-Reactive Protein No results for input(s): CRP in the last 72 hours.  Micro Results: Recent Results (from the past 720 hour(s))  Resp Panel by RT-PCR (Flu A&B, Covid) Nasopharyngeal Swab     Status: None   Collection Time: 01/15/21  9:52 AM   Specimen: Nasopharyngeal Swab; Nasopharyngeal(NP) swabs in vial transport medium  Result Value Ref Range Status   SARS Coronavirus 2 by RT PCR NEGATIVE NEGATIVE Final    Comment: (NOTE) SARS-CoV-2 target nucleic acids are NOT DETECTED.  The SARS-CoV-2 RNA is generally detectable in upper respiratory specimens during the acute phase of infection. The lowest concentration of SARS-CoV-2 viral copies this assay can detect is 138 copies/mL. A negative result does not preclude SARS-Cov-2 infection and should not be used as the sole basis for treatment or other patient management decisions. A negative result may occur with  improper specimen collection/handling, submission of specimen other than nasopharyngeal swab, presence of viral mutation(s) within the areas targeted by this assay, and inadequate number of viral copies(<138 copies/mL). A negative result must be combined with clinical observations, patient history, and epidemiological information. The expected result is Negative.  Fact Sheet for Patients:  EntrepreneurPulse.com.au  Fact Sheet for Healthcare Providers:   IncredibleEmployment.be  This test is no t yet approved or cleared by the [place] FDA and  has been authorized for detection and/or diagnosis of SARS-CoV-2 by FDA under an Emergency Use Authorization (EUA). This EUA will remain  in effect (meaning this test can be used) for the duration of the COVID-19 declaration under Section 564(b)(1) of the Act, 21 U.S.C.section 360bbb-3(b)(1), unless the authorization is terminated  or revoked sooner.       Influenza A by PCR NEGATIVE NEGATIVE Final   Influenza B by PCR NEGATIVE NEGATIVE Final    Comment: (NOTE)  The Xpert Xpress SARS-CoV-2/FLU/RSV plus assay is intended as an aid in the diagnosis of influenza from Nasopharyngeal swab specimens and should not be used as a sole basis for treatment. Nasal washings and aspirates are unacceptable for Xpert Xpress SARS-CoV-2/FLU/RSV testing.  Fact Sheet for Patients: EntrepreneurPulse.com.au  Fact Sheet for Healthcare Providers: IncredibleEmployment.be  This test is not yet approved or cleared by the [place] FDA and has been authorized for detection and/or diagnosis of SARS-CoV-2 by FDA under an Emergency Use Authorization (EUA). This EUA will remain in effect (meaning this test can be used) for the duration of the COVID-19 declaration under Section 564(b)(1) of the Act, 21 U.S.C. section 360bbb-3(b)(1), unless the authorization is terminated or revoked.  Performed at [facility], [place] [address], [place], [place] [address]   Culture, blood (routine x 2)     Status: Abnormal   Collection Time: 01/15/21  8:28 PM   Specimen: BLOOD RIGHT FOREARM  Result Value Ref Range Status   Specimen Description BLOOD RIGHT FOREARM  Final   Special Requests   Final    BOTTLES DRAWN AEROBIC AND ANAEROBIC Blood Culture results may not be optimal due to an inadequate volume of blood received in culture bottles   Culture  Setup Time    Final    GRAM POSITIVE COCCI IN CLUSTERS ANAEROBIC BOTTLE ONLY CRITICAL RESULT CALLED TO, READ BACK BY AND VERIFIED WITH: J. FRENS PHARMD,AT 1050 01/16/21 D. VANHOOK    Culture (A)  Final    STAPHYLOCOCCUS AUREUS SUSCEPTIBILITIES PERFORMED ON PREVIOUS CULTURE WITHIN THE LAST 5 DAYS. Performed at [facility], [place] [address], [place], [place] [address]    Report Status 01/18/2021 FINAL  Final  Culture, blood (routine x 2)     Status: Abnormal   Collection Time: 01/15/21  8:49 PM   Specimen: BLOOD  Result Value Ref Range Status   Specimen Description BLOOD RIGHT UPPER ARM  Final   Special Requests   Final    BOTTLES DRAWN AEROBIC AND ANAEROBIC Blood Culture adequate volume   Culture  Setup Time   Final    GRAM POSITIVE COCCI IN CLUSTERS IN BOTH AEROBIC AND ANAEROBIC BOTTLES CRITICAL RESULT CALLED TO, READ BACK BY AND VERIFIED WITH: J. FRENS PHARMD,AT 1050 01/16/21 [place] Performed at [facility], [place] [address], [place], [place] [address]    Culture STAPHYLOCOCCUS AUREUS (A)  Final   Report Status 01/18/2021 FINAL  Final   Organism ID, Bacteria STAPHYLOCOCCUS AUREUS  Final      Susceptibility   Staphylococcus aureus - MIC*    CIPROFLOXACIN <=0.5 SENSITIVE Sensitive     ERYTHROMYCIN <=0.25 SENSITIVE Sensitive     GENTAMICIN <=0.5 SENSITIVE Sensitive     OXACILLIN 0.5 SENSITIVE Sensitive     TETRACYCLINE <=1 SENSITIVE Sensitive     VANCOMYCIN <=0.5 SENSITIVE Sensitive     TRIMETH/SULFA <=10 SENSITIVE Sensitive     CLINDAMYCIN <=0.25 SENSITIVE Sensitive     RIFAMPIN <=0.5 SENSITIVE Sensitive     Inducible Clindamycin NEGATIVE Sensitive     * STAPHYLOCOCCUS AUREUS  Blood Culture ID Panel (Reflexed)     Status: Abnormal   Collection Time: 01/15/21  8:49 PM  Result Value Ref Range Status   Enterococcus faecalis NOT DETECTED NOT DETECTED Final   Enterococcus Faecium NOT DETECTED NOT DETECTED Final   Listeria monocytogenes NOT DETECTED NOT DETECTED Final    Staphylococcus species DETECTED (A) NOT DETECTED Final    Comment: CRITICAL RESULT CALLED  TO, READ BACK BY AND VERIFIED WITH: J. FRENS PHARMD,AT 1050 01/16/21 D. VANHOOK    Staphylococcus aureus (BCID) DETECTED (A) NOT DETECTED Final    Comment: CRITICAL RESULT CALLED TO, READ BACK BY AND VERIFIED WITH: J. FRENS PHARMD,AT [id number] 01/16/21 D. VANHOOK    Staphylococcus epidermidis NOT DETECTED NOT DETECTED Final   Staphylococcus lugdunensis NOT DETECTED NOT DETECTED Final   Streptococcus species NOT DETECTED NOT DETECTED Final   Streptococcus agalactiae NOT DETECTED NOT DETECTED Final   Streptococcus pneumoniae NOT DETECTED NOT DETECTED Final   Streptococcus pyogenes NOT DETECTED NOT DETECTED Final   A.calcoaceticus-baumannii NOT DETECTED NOT DETECTED Final   Bacteroides fragilis NOT DETECTED NOT DETECTED Final   Enterobacterales NOT DETECTED NOT DETECTED Final   Enterobacter cloacae complex NOT DETECTED NOT DETECTED Final   Escherichia coli NOT DETECTED NOT DETECTED Final   Klebsiella aerogenes NOT DETECTED NOT DETECTED Final   Klebsiella oxytoca NOT DETECTED NOT DETECTED Final   Klebsiella pneumoniae NOT DETECTED NOT DETECTED Final   Proteus species NOT DETECTED NOT DETECTED Final   Salmonella species NOT DETECTED NOT DETECTED Final   Serratia marcescens NOT DETECTED NOT DETECTED Final   Haemophilus influenzae NOT DETECTED NOT DETECTED Final   Neisseria meningitidis NOT DETECTED NOT DETECTED Final   Pseudomonas aeruginosa NOT DETECTED NOT DETECTED Final   Stenotrophomonas maltophilia NOT DETECTED NOT DETECTED Final   Candida albicans NOT DETECTED NOT DETECTED Final   Candida auris NOT DETECTED NOT DETECTED Final   Candida glabrata NOT DETECTED NOT DETECTED Final   Candida krusei NOT DETECTED NOT DETECTED Final   Candida parapsilosis NOT DETECTED NOT DETECTED Final   Candida tropicalis NOT DETECTED NOT DETECTED Final   Cryptococcus neoformans/gattii NOT DETECTED NOT DETECTED Final   Meth  resistant mecA/C and MREJ NOT DETECTED NOT DETECTED Final    Comment: Performed at [facility], [address], [place], [place] [address]  MRSA PCR Screening     Status: None   Collection Time: 01/16/21 12:50 AM   Specimen: Nasal Mucosa; Nasopharyngeal  Result Value Ref Range Status   MRSA by PCR NEGATIVE NEGATIVE Final    Comment:        The GeneXpert MRSA Assay (FDA approved for NASAL specimens only), is one component of a comprehensive MRSA colonization surveillance program. It is not intended to diagnose MRSA infection nor to guide or monitor treatment for MRSA infections. Performed at [facility], [place] [address], [place], [place] [address]   Gastrointestinal Panel by PCR , Stool     Status: None   Collection Time: 01/16/21  8:58 AM   Specimen: Stool  Result Value Ref Range Status   Campylobacter species NOT DETECTED NOT DETECTED Final   Plesimonas shigelloides NOT DETECTED NOT DETECTED Final   Salmonella species NOT DETECTED NOT DETECTED Final   Yersinia enterocolitica NOT DETECTED NOT DETECTED Final   Vibrio species NOT DETECTED NOT DETECTED Final   Vibrio cholerae NOT DETECTED NOT DETECTED Final   Enteroaggregative E coli (EAEC) NOT DETECTED NOT DETECTED Final   Enteropathogenic E coli (EPEC) NOT DETECTED NOT DETECTED Final   Enterotoxigenic E coli (ETEC) NOT DETECTED NOT DETECTED Final   Shiga like toxin producing E coli (STEC) NOT DETECTED NOT DETECTED Final   Shigella/Enteroinvasive E coli (EIEC) NOT DETECTED NOT DETECTED Final   Cryptosporidium NOT DETECTED NOT DETECTED Final   Cyclospora cayetanensis NOT DETECTED NOT DETECTED Final   Entamoeba histolytica NOT DETECTED NOT DETECTED Final   Giardia lamblia  NOT DETECTED NOT DETECTED Final   Adenovirus F40/41 NOT DETECTED NOT DETECTED Final   Astrovirus NOT DETECTED NOT DETECTED Final   Norovirus GI/GII NOT DETECTED NOT DETECTED Final   Rotavirus A NOT DETECTED NOT DETECTED Final   Sapovirus (I, II,  IV, and V) NOT DETECTED NOT DETECTED Final    Comment: Performed at [facility], [place], [place] [address]  C Difficile Quick Screen w PCR reflex     Status: Abnormal   Collection Time: 01/16/21  8:58 AM   Specimen: STOOL  Result Value Ref Range Status   C Diff antigen POSITIVE (A) NEGATIVE Final   C Diff toxin NEGATIVE NEGATIVE Final   C Diff interpretation Results are indeterminate. See PCR results.  Final    Comment: Performed at [facility], [place] [address], [place], [place] [address]  C. Diff by PCR, Reflexed     Status: Abnormal   Collection Time: 01/16/21  8:58 AM  Result Value Ref Range Status   Toxigenic C. Difficile by PCR POSITIVE (A) NEGATIVE Final    Comment: Positive for toxigenic C. difficile with little to no toxin production. Only treat if clinical presentation suggests symptomatic illness. Performed at [facility], [place] [address], [place], [place] [address]   Culture, blood (Routine X 2) w Reflex to ID Panel     Status: None (Preliminary result)   Collection Time: 01/17/21  6:59 AM   Specimen: BLOOD RIGHT HAND  Result Value Ref Range Status   Specimen Description BLOOD RIGHT HAND  Final   Special Requests   Final    BOTTLES DRAWN AEROBIC AND ANAEROBIC Blood Culture adequate volume   Culture   Final    NO GROWTH 4 DAYS Performed at [facility], [place] [address] [place], [place] [address]    Report Status PENDING  Incomplete  Culture, blood (Routine X 2) w Reflex to ID Panel     Status: None (Preliminary result)   Collection Time: 01/17/21  6:59 AM   Specimen: BLOOD RIGHT WRIST  Result Value Ref Range Status   Specimen Description BLOOD RIGHT WRIST  Final   Special Requests   Final    BOTTLES DRAWN AEROBIC AND ANAEROBIC Blood Culture adequate volume   Culture   Final    NO GROWTH 4 DAYS Performed at [facility], [place] [address] [place], [place] [address]    Report Status PENDING  Incomplete  Culture, blood  (routine x 2)     Status: None (Preliminary result)   Collection Time: 01/19/21  4:46 AM   Specimen: BLOOD RIGHT HAND  Result Value Ref Range Status   Specimen Description BLOOD RIGHT HAND  Final   Special Requests   Final    BOTTLES DRAWN AEROBIC ONLY Blood Culture adequate volume   Culture   Final    NO GROWTH 2 DAYS Performed at [facility], [place] [address] [place], [place] [address]    Report Status PENDING  Incomplete  Culture, blood (routine x 2)     Status: None (Preliminary result)   Collection Time: 01/19/21  4:46 AM   Specimen: BLOOD RIGHT HAND  Result Value Ref Range Status   Specimen Description BLOOD RIGHT HAND  Final   Special Requests   Final    BOTTLES DRAWN AEROBIC ONLY Blood Culture adequate volume   Culture   Final    NO GROWTH 2 DAYS Performed at [facility], [address] [place], [place] [address]    Report Status PENDING  Incomplete    Studies/Results: ECHO TEE  Result Date: 01/19/2021    TRANSESOPHOGEAL ECHO REPORT   Patient Name:   SAYID MORENZ DIAZ Date of Exam: 01/19/2021 Medical Rec #:  [id number]            Height:       62.0 in Accession #:    [id number]           Weight:       154.3 lb Date of Birth:  1956-03-22             BSA:          1.712 m Patient Age:    65 years             BP:           116/40 mmHg Patient Gender: M                    HR:           63 bpm. Exam Location:  Inpatient Procedure: Transesophageal Echo and Color Doppler Indications:     Bacteremia  History:         Patient has prior history of Echocardiogram examinations, most                  recent 01/16/2021.  Sonographer:     Philipp Deputy Referring Phys:  [id number] HAO MENG Diagnosing Phys: Candee Furbish MD PROCEDURE: After discussion of the risks and benefits of a TEE, an informed consent was obtained from the patient. The transesophogeal probe was passed without difficulty through the esophogus of the patient. Imaged were obtained with the patient in a left lateral  decubitus position. Local oropharyngeal anesthetic was provided with viscous lidocaine. Sedation performed by different physician. The patient was monitored while under deep sedation. Anesthestetic sedation was provided intravenously by Anesthesiology: '223mg'$  of Propofol. Image quality was adequate. The patient's vital signs; including heart rate, blood pressure, and oxygen saturation; remained stable throughout the procedure. The patient developed no complications during the procedure. IMPRESSIONS  1. Left ventricular ejection fraction, by estimation, is 60 to 65%. The left ventricle has normal function. The left ventricle has no regional wall motion abnormalities.  2. Right ventricular systolic function is normal. The right ventricular size is normal.  3. No left atrial/left atrial appendage thrombus was detected.  4. The mitral valve is normal in structure. Trivial mitral valve regurgitation. No evidence of mitral stenosis.  5. The aortic valve is normal in structure. Aortic valve regurgitation is not visualized. No aortic stenosis is present.  6. There is mild (Grade II) plaque.  7. The inferior vena cava is normal in size with greater than 50% respiratory variability, suggesting right atrial pressure of 3 mmHg. Conclusion(s)/Recommendation(s): No evidence of vegetation/infective endocarditis on this transesophageal echocardiogram. FINDINGS  Left Ventricle: Left ventricular ejection fraction, by estimation, is 60 to 65%. The left ventricle has normal function. The left ventricle has no regional wall motion abnormalities. The left ventricular internal cavity size was normal in size. There is  no left ventricular hypertrophy. Right Ventricle: The right ventricular size is normal. No increase in right ventricular wall thickness. Right ventricular systolic function is normal. Left Atrium: Left atrial size was normal in size. No left atrial/left atrial appendage thrombus was detected. Right Atrium: Right atrial size  was normal in size. Pericardium: There is no evidence of  pericardial effusion. Mitral Valve: The mitral valve is normal in structure. Trivial mitral valve regurgitation. No evidence of mitral valve stenosis. Tricuspid Valve: The tricuspid valve is normal in structure. Tricuspid valve regurgitation is mild . No evidence of tricuspid stenosis. Aortic Valve: The aortic valve is normal in structure. Aortic valve regurgitation is not visualized. No aortic stenosis is present. Pulmonic Valve: The pulmonic valve was normal in structure. Pulmonic valve regurgitation is not visualized. No evidence of pulmonic stenosis. Aorta: The aortic root was not well visualized. There is mild (Grade II) plaque. Venous: The inferior vena cava is normal in size with greater than 50% respiratory variability, suggesting right atrial pressure of 3 mmHg. IAS/Shunts: No atrial level shunt detected by color flow Doppler. Candee Furbish MD Electronically signed by Candee Furbish MD Signature Date/Time: 01/19/2021/4:48:29 PM    Final    VAS [place] UPPER EXTREMITY VENOUS DUPLEX  Result Date: 01/20/2021 UPPER VENOUS STUDY  Patient Name:  MALEKE CAMFIELD  Date of Exam:   01/20/2021 Medical Rec #: [id number]             Accession #:    [id number] Date of Birth: 03/08/1956              Patient Gender: M Patient Age:   65Y Exam Location:  [facility] Procedure:      VAS [place] UPPER EXTREMITY VENOUS DUPLEX Referring Phys: [id number] [facility] NARENDRA --------------------------------------------------------------------------------  Indications: LT upper extremity swelling in patient with AVF. 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 Performing Technologist: Sharion Dove RVS  Examination Guidelines: A complete evaluation includes B-mode imaging, spectral Doppler, color Doppler, and power Doppler as  needed of all accessible portions of each vessel. Bilateral testing is considered an integral part of a complete examination. Limited examinations for reoccurring indications may be performed as noted.  Right Findings: +----------+------------+---------+-----------+----------+-------+ RIGHT     CompressiblePhasicitySpontaneousPropertiesSummary +----------+------------+---------+-----------+----------+-------+ Subclavian               Yes       Yes                      +----------+------------+---------+-----------+----------+-------+  Left Findings: +----------+------------+---------+-----------+----------+----------+ LEFT      CompressiblePhasicitySpontaneousProperties Summary   +----------+------------+---------+-----------+----------+----------+ IJV           Full       Yes       Yes                         +----------+------------+---------+-----------+----------+----------+ Subclavian    Full       Yes       Yes                         +----------+------------+---------+-----------+----------+----------+ Axillary                 Yes       Yes                         +----------+------------+---------+-----------+----------+----------+ Brachial  Yes       Yes                         +----------+------------+---------+-----------+----------+----------+ Radial        Full                                             +----------+------------+---------+-----------+----------+----------+ Ulnar         Full                                             +----------+------------+---------+-----------+----------+----------+ Cephalic      Full                                             +----------+------------+---------+-----------+----------+----------+ Basilic                                             AVF patent +----------+------------+---------+-----------+----------+----------+  Summary:  Right: No evidence of thrombosis in  the subclavian.  Left: No evidence of deep vein thrombosis in the upper extremity. No evidence of superficial vein thrombosis in the upper extremity.  *See table(s) above for measurements and observations.  Diagnosing physician: Jamelle Haring Electronically signed by Jamelle Haring on 01/20/2021 at 7:17:42 PM.    Final    CT Extrem Up Entire Arm L WO/CM  Result Date: 01/20/2021 CLINICAL DATA:  Soft tissue infection or underlying abscess . Not improving with antibiotics. EXAM: CT OF THE UPPER LEFT EXTREMITY WITHOUT CONTRAST TECHNIQUE: Multidetector CT imaging of the upper left extremity was performed according to the standard protocol. COMPARISON:  None. FINDINGS: Bones/Joint/Cartilage No cortical erosion or destruction. No acute displaced fracture or dislocation of the visualized bones of the left upper extremity. Ligaments Suboptimally assessed by CT. Muscles and Tendons Grossly unremarkable. Soft tissues Diffuse distal arm and forearm subcutaneus soft tissue edema with mild dermal thickening along the volar aspect of the elbow. No organized fluid collection. No subcutaneus soft tissue emphysema. There is a soft tissue defect along the distal left forearm measuring approximately 3 cm (4:109). 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 IMPRESSION: 1. Subcutaneus soft tissue edema and dermal thickening with no associated organized fluid. 2. No acute osseous abnormality. Electronically Signed   By: Iven Finn M.D.   On: 01/20/2021 20:41      Assessment/Plan:  INTERVAL HISTORY:   patient with worsening left upper extremity edema and pain    Active Problems:   Intractable nausea and vomiting   Sepsis ([place])   Shock ([place])   MSSA bacteremia   ESRD needing dialysis ([place])   Gallstones   Nausea and vomiting in adult   Septic shock ([place])   C. difficile diarrhea    Henryk Vaness Keane Scrape is a 65 y.o. male with end-stage renal disease on  hemodialysis admitted with septic shock due to methicillin sensitive Staph aureus.CT of the chest shows evidence of septic emboli to the lungs. While TEE was negati,ve I would assume  that there was a right-sided vegetation either on his heart valve or his HD catheter hat caused his septic embolization.  While his MRI of the spine does not show clear-cut discitis or osteomyelitis the degree of pain he is experiencing and the fact that it is new would lead me to suspect that he DOES in fact have infection in the spine  Could also consider MRI of the hip for thoroughness sake given the pain he has also in his right hip and buttocks but the dominant finding on exam is his back pain with leg raises, manipulations of hip joint  He has significant swelling of his entire left upper extremity withEdema and tenderness throughout.  CT scan was ordered And showed soft tissue edema but no evidence of abscess.   Vascular surgery of come and see the patient again and they do not think that there is evidence of infection but that the edema may be due to outflow stenosis And fistulogram is being arranged.   Fortunately he is cleared his blood cultures.  I would favor giving him 6- 8 weeks of effective antibiotics post clearance of cultures   Possible C difficile: on dificid  I spent more than 35 minutes with the patient including greater than 50% of time in face to face counseling of the patient personally reviewing radiographs, along with pertinent laboratory microbiological data review of medical records and in coordination of his care.   Dr. Linus Salmons will be back tomorrow.      LOS: 6 days   Alcide Evener 01/21/2021, 12:57 PM

## 2021-01-21 NOTE — Progress Notes (Signed)
[facility] NEPHROLOGY PROGRESS NOTE  Summary Pt is a 65 y.o. yo male with ESRD on HD MWF at [facility], with past medical history of DM, HTN, secondary hyperparathyroidism, anemia, last HD on 6/3 who presented to the ER with fever, N/Vand diarrhea, seen as a consultation for the management of ESRD.    HD Orders: [place] MWF 3h 65mn   400/500   68kg   2/2 bath  P4  AVF  Hep 2200 -Hectorol 4 mcg IV TIW  Assessment/ Plan: Septic shock - due to MSSA bacteremia. Shock resolved. Unclear source of sepsis, no signs of AVF infection by exam. Seen by ID and currently on cefazolin. TEE 6/10 was negative for vegetation. L arm pain today > going for CT L arm.  ESRD: MWF HD. Next HD Monday.  BP/ volume - 3kg up, +LUE edema, mild other edema. Max UF w/ HD tomorrow. Started midodrine 10 tid here > will lower to 5 mg tid. BP's good.  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 Continue to monitor.  Transfuse prn. Not on ESA as outpatient. Secondary hyperparathyroidism: Phosphorus level acceptable.  Continue binders, vitamin D.   Hyponatremia - resolved.  Cdif diarrhea - getting Dificid per ID, diarrhea better.    RKelly Splinter MD 01/21/2021, 8:03 AM    Subjective: Seen in room.  C/o pain in L arm today. Getting CT of arm to look for abscess/ infection.    Physical Exam: General:NAD, comfortable..Marland KitchenHeart:RRR, s1s2 nl Lungs: Clear bilateral, no wheeze or crackle Abdomen:soft, Non-tender, non-distended Extremities: No LE edema Dialysis Access: AV fistula.     Objective Vital signs in last 24 hours: Vitals:   01/20/21 0402 01/20/21 1541 01/20/21 2055 01/21/21 0434  BP: (!) 142/54 (!) 151/52 (!) 158/53 (!) 120/54  Pulse: 70 70 70 66  Resp: '17 18 17 17  '$ Temp: 99.7 F (37.6 C) 98.8 F (37.1 C) 98.9 F (37.2 C) 98.1 F (36.7 C)  TempSrc: Oral Oral Oral Oral  SpO2: 99% 100% 99% 100%  Weight:      Height:       Weight change:   Intake/Output Summary  (Last 24 hours) at 01/21/2021 0803 Last data filed at 01/20/2021 1000 Gross per 24 hour  Intake 140 ml  Output --  Net 140 ml        Labs: Basic Metabolic Panel: Recent Labs  Lab 01/19/21 0446 01/20/21 0856 01/21/21 0132  NA 128* 133* 133*  K 3.8 3.6 3.7  CL 92* 96* 96*  CO2 18* 23 25  GLUCOSE 161* 152* 85  BUN 84* 51* 60*  CREATININE 12.12* 7.87* 9.07*  CALCIUM 7.8* 7.9* 8.0*  PHOS 4.5 3.5 4.5    Liver Function Tests: Recent Labs  Lab 01/15/21 0612 01/15/21 2049 01/16/21 0103 01/19/21 0446 01/20/21 0856 01/21/21 0132  AST 42* 55*  --   --   --   --   ALT 29 27  --   --   --   --   ALKPHOS 100 92  --   --   --   --   BILITOT 1.3* 1.3*  --   --   --   --   PROT 8.2* 6.7  --   --   --   --   ALBUMIN 3.7 3.1*   < > 2.3* 2.3* 2.1*   < > = values in this interval not displayed.    Recent Labs  Lab 01/15/21 [phone]  LIPASE 33    No results for input(s): AMMONIA in the last 168 hours. CBC: Recent Labs  Lab 01/16/21 0103 01/16/21 1530 01/17/21 0659 01/18/21 0026 01/19/21 0446 01/20/21 0856 01/21/21 0132  WBC 27.9*   < > 10.5 11.3* 14.0* 14.6* 16.6*  NEUTROABS 23.7*  --   --   --   --  11.3* 12.4*  HGB 11.7*   < > 9.7* 9.4* 8.9* 9.0* 8.8*  HCT 40.5   < > 28.0* 28.0* 26.7* 27.6* 26.5*  MCV 91.4   < > 91.5 93.6 95.0 96.8 96.7  PLT 294   < > 73* 78* 136* 211 252   < > = values in this interval not displayed.    Cardiac Enzymes: Recent Labs  Lab 01/21/21 0132  CKTOTAL 58   CBG: Recent Labs  Lab 01/20/21 1605 01/20/21 2051 01/21/21 0040 01/21/21 0403 01/21/21 0800  GLUCAP 105* 154* 88 119* 164*      Medications: Infusions:  sodium chloride Stopped (01/17/21 0414)   sodium chloride Stopped (01/19/21 0013)    ceFAZolin (ANCEF) IV 1 g (01/20/21 1748)    Scheduled Medications:  Chlorhexidine Gluconate Cloth  6 each Topical Q0600   Chlorhexidine Gluconate Cloth  6 each Topical Q0600   doxercalciferol  4 mcg Intravenous Q M,W,F-HD    fidaxomicin  200 mg Oral BID   heparin  2,200 Units Dialysis Once in dialysis   insulin aspart  3-9 Units Subcutaneous Q4H   midodrine  10 mg Oral TID WC   multivitamin  1 tablet Oral QHS   pantoprazole (PROTONIX) IV  40 mg Intravenous Daily   sucroferric oxyhydroxide  1,000 mg Oral TID WC    have reviewed scheduled and prn medications.

## 2021-01-21 NOTE — Consult Note (Signed)
[facility] [place]  ASSESSMENT / PLAN: 65 y.o. male with MSSA bacteremia of unclear origin.  He has end-stage renal disease and dialyzes via left upper extremity second stage brachiobasilic AV fistula.  There are no signs of infection in the AV fistula.  His left upper extremity swelling may be explained by outflow stenosis.  We will arrange for fistulogram early this week.  CHIEF COMPLAINT: Swollen left arm  HISTORY OF PRESENT ILLNESS: Ryan Wells is a 65 y.o. male mated to the internal medicine service for persistent MSSA bacteremia without clear source.  Initially admitted to the ICU for septic shock requiring vasopressor therapy.  He also has infection with C. difficile.  He is on Ancef and fidaxomicin.  He has developed left upper extremity swelling over the past day.  Venous duplex showed no evidence of deep venous thrombosis.  Patient is Spanish-speaking.  History is per chart review and discussion with the son who is at the bedside.  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  Did not get outpatient stress testing following hospitalization.  No chest pain since   Chronic kidney disease    on dialysis Tues, Thurs and Sat   Diabetes mellitus without complication ([place])    Diabetes type 2, uncontrolled ([place]) 11/25/2011   no meds, diet controlled per patient   Diabetic gastroparesis ([place]) 01/22/2017   Hypertension    no meds   Microalbuminuria 01/19/2017   Myocardial infarction [facility]) 2015    Past Surgical History:  Procedure Laterality Date   A/V FISTULAGRAM Left 06/04/2019   Procedure: A/V FISTULAGRAM;  Surgeon: Angelia Mould, MD;  Location: [place] [facility];  Service: Cardiovascular;  Laterality: Left;   A/V FISTULAGRAM Left 10/15/2019   Procedure: A/V FISTULAGRAM;   Surgeon: Angelia Mould, MD;  Location: [place] [facility];  Service: Cardiovascular;  Laterality: Left;   AV FISTULA PLACEMENT Left 07/02/2018   Procedure: BRACHIOCEPHALIC ARTERIOVENOUS (AV) FISTULA CREATION LEFT ARM;  Surgeon: Serafina Mitchell, MD;  Location: [place];  Service: Vascular;  Laterality: Left;   [place] Left 07/26/2019   Procedure: Bascilic Vein Transposition;  Surgeon: Angelia Mould, MD;  Location: [place];  Service: Vascular;  Laterality: Left;   COLONOSCOPY     EMBOLIZATION Left 06/04/2019   Procedure: EMBOLIZATION;  Surgeon: Angelia Mould, MD;  Location: [place] [facility];  Service: Cardiovascular;  Laterality: Left;  LT ARM FISTULA/COMPETING BRANCH   EYE SURGERY Bilateral    cataracts x2   EYE SURGERY     INSERTION OF DIALYSIS CATHETER Right 06/08/2019   Procedure: INSERTION OF PALINDROME DIALYSIS CATHETER IN RIGHT INTERNAL JUGULAR;  Surgeon: Angelia Mould, MD;  Location: [place];  Service: Vascular;  Laterality: Right;   LIGATION OF ARTERIOVENOUS  FISTULA Left 07/26/2019   Procedure: Ligation Of BrachioCephalic  Fistula;  Surgeon: Angelia Mould, MD;  Location: [place];  Service: Vascular;  Laterality: Left;   NECK SURGERY     PERIPHERAL VASCULAR BALLOON ANGIOPLASTY Left 06/04/2019   Procedure: PERIPHERAL VASCULAR BALLOON ANGIOPLASTY;  Surgeon: Angelia Mould, MD;  Location: [place] [facility];  Service: Cardiovascular;  Laterality: Left;  ARM FISTULA   PERIPHERAL VASCULAR BALLOON ANGIOPLASTY Left 10/15/2019   Procedure: PERIPHERAL VASCULAR BALLOON ANGIOPLASTY;  Surgeon: Scot Dock,  Judeth Cornfield, MD;  Location: [place] [facility];  Service: Cardiovascular;  Laterality: Left;  arm fistula   TEE WITHOUT CARDIOVERSION N/A 01/19/2021   Procedure: TRANSESOPHAGEAL ECHOCARDIOGRAM (TEE);  Surgeon: Jerline Pain, MD;  Location: [facility] END[facility];  Service: Cardiovascular;  Laterality: N/A;    Family History  Problem Relation  Age of Onset   Heart disease Mother        cause of death--CHF?   Diabetes Father    Kidney disease Father        Dialysis for kidney failure   Diabetes Sister    Diabetes Brother    Colon cancer Neg Hx    Stomach cancer Neg Hx    Esophageal cancer Neg Hx     Social History   Socioeconomic History   Marital status: Married    Spouse name: Mariana Arn   Number of children: 3   Years of education: 1 year university   Highest education level: Not on file  Occupational History   Occupation: unemployed  Tobacco Use   Smoking status: Former    Pack years: 0.00   Smokeless tobacco: Never  Vaping Use   Vaping Use: Never used  Substance and Sexual Activity   Alcohol use: Not Currently   Drug use: Never   Sexual activity: Yes  Other Topics Concern   Not on file  Social History Narrative   ** Merged History Encounter **       Originally from [place] Came to [employer]. In 1985 Lives in [place] neighborhood with wife and 3 sons.   Sons are 72-30 yo Some sons help with support   Social Determinants of Radio broadcast assistant Strain: Not on file  Food Insecurity: Not on file  Transportation Needs: Not on file  Physical Activity: Not on file  Stress: Not on file  Social Connections: Not on file  Intimate Partner Violence: Not on file    No Known Allergies  Current Facility-Administered Medications  Medication Dose Route Frequency Provider Last Rate Last Admin   0.9 %  sodium chloride infusion  250 mL Intravenous Continuous Jerline Pain, MD   Stopped at 01/17/21 0414   0.9 %  sodium chloride infusion   Intravenous Continuous Jerline Pain, MD   Stopped at 01/19/21 0013   acetaminophen (TYLENOL) tablet 650 mg  650 mg Oral Q6H PRN Jerline Pain, MD   650 mg at 01/21/21 0406   Or   acetaminophen (TYLENOL) suppository 650 mg  650 mg Rectal Q6H PRN Jerline Pain, MD       alteplase (CATHFLO ACTIVASE) injection 2 mg  2 mg Intracatheter Once PRN Jerline Pain, MD       ceFAZolin (ANCEF) IVPB 1 g/50 mL premix  1 g Intravenous Q24H Jerline Pain, MD 100 mL/hr at 01/20/21 1748 1 g at 01/20/21 1748   Chlorhexidine Gluconate Cloth 2 % PADS 6 each  6 each Topical Q0600 Jerline Pain, MD   6 each at 01/20/21 0457   Chlorhexidine Gluconate Cloth 2 % PADS 6 each  6 each Topical Q0600 Jerline Pain, MD   6 each at 01/20/21 0457   doxercalciferol (HECTOROL) injection 4 mcg  4 mcg Intravenous Q M,W,F-HD Jerline Pain, MD   4 mcg at 01/19/21 1045   fidaxomicin (DIFICID) tablet 200 mg  200 mg Oral BID Jerline Pain, MD   200 mg at 01/21/21 0923   heparin injection 1,000  Units  1,000 Units Dialysis PRN Jerline Pain, MD       heparin injection 1,400 Units  20 Units/kg Dialysis PRN Jerline Pain, MD       heparin injection 2,200 Units  2,200 Units Dialysis Once in dialysis Jerline Pain, MD       insulin aspart (novoLOG) injection 3-9 Units  3-9 Units Subcutaneous Q4H Jerline Pain, MD   6 Units at 01/21/21 0923   lidocaine (PF) (XYLOCAINE) 1 % injection 5 mL  5 mL Intradermal PRN Jerline Pain, MD       lidocaine-prilocaine (EMLA) cream 1 application  1 application Topical PRN Jerline Pain, MD       midodrine (PROAMATINE) tablet 10 mg  10 mg Oral TID WC Jerline Pain, MD   10 mg at 01/21/21 [id number]   multivitamin (RENA-VIT) tablet 1 tablet  1 tablet Oral QHS Jerline Pain, MD   1 tablet at 01/20/21 2136   ondansetron (ZOFRAN) tablet 4 mg  4 mg Oral Q6H PRN Jerline Pain, MD       Or   ondansetron [facility]) injection 4 mg  4 mg Intravenous Q6H PRN Jerline Pain, MD   4 mg at 01/20/21 [id number]   oxyCODONE (Oxy IR/ROXICODONE) immediate release tablet 5-10 mg  5-10 mg Oral Q6H PRN Jerline Pain, MD   10 mg at 01/21/21 1137   pentafluoroprop-tetrafluoroeth (GEBAUERS) aerosol 1 application  1 application Topical PRN Jerline Pain, MD       sucroferric oxyhydroxide (VELPHORO) chewable tablet 1,000 mg  1,000 mg Oral TID WC Jerline Pain, MD   1,000 mg at  01/21/21 [id number]    REVIEW OF SYSTEMS:  '[X]'$  denotes positive finding, '[ ]'$  denotes negative finding Cardiac  Comments:  Chest pain or chest pressure:    Shortness of breath upon exertion:    Short of breath when lying flat:    Irregular heart rhythm:        Vascular    Pain in calf, thigh, or hip brought on by ambulation:    Pain in feet at night that wakes you up from your sleep:     Blood clot in your veins:    Leg swelling:         Pulmonary    Oxygen at home:    Productive cough:     Wheezing:         Neurologic    Sudden weakness in arms or legs:     Sudden numbness in arms or legs:     Sudden onset of difficulty speaking or slurred speech:    Temporary loss of vision in one eye:     Problems with dizziness:         Gastrointestinal    Blood in stool:     Vomited blood:         Genitourinary    Burning when urinating:     Blood in urine:        Psychiatric    Major depression:         Hematologic    Bleeding problems:    Problems with blood clotting too easily:        Skin    Rashes or ulcers:        Constitutional    Fever or chills:      PHYSICAL EXAM  Vitals:   01/20/21 0402 01/20/21 1541 01/20/21 2055 01/21/21 0434  BP: (!) 142/54 Marland Kitchen)  151/52 (!) 158/53 (!) 120/54  Pulse: 70 70 70 66  Resp: '17 18 17 17  '$ Temp: 99.7 F (37.6 C) 98.8 F (37.1 C) 98.9 F (37.2 C) 98.1 F (36.7 C)  TempSrc: Oral Oral Oral Oral  SpO2: 99% 100% 99% 100%  Weight:      Height:        Constitutional: chronically ill appearing. no distress. Appears well nourished.  Neurologic: CN intact. no focal findings. no sensory loss. Psychiatric: Mood and affect symmetric and appropriate. Eyes: No icterus. No conjunctival pallor. Ears, nose, throat: mucous membranes moist. Midline trachea.  Cardiac: regular rate and rhythm.  Respiratory: unlabored. Abdominal: soft, non-tender, non-distended.  Peripheral vascular:  LUE 1+ edema about the hand. No edema in other  extremties. LUE BB AVF with strong thrill. No pulsatility. Prominent collateral veins about the shoulder.  Extremity: No cyanosis. No pallor.  Skin: No gangrene. No ulceration.  Lymphatic: No Stemmer's sign. No palpable lymphadenopathy.  PERTINENT LABORATORY AND RADIOLOGIC DATA  Most recent CBC CBC Latest Ref Rng & Units 01/21/2021 01/20/2021 01/19/2021  WBC 4.0 - 10.5 K/uL 16.6(H) 14.6(H) 14.0(H)  Hemoglobin 13.0 - 17.0 g/dL 8.8(L) 9.0(L) 8.9(L)  Hematocrit 39.0 - 52.0 % 26.5(L) 27.6(L) 26.7(L)  Platelets 150 - 400 K/uL 252 211 136(L)     Most recent CMP CMP Latest Ref Rng & Units 01/21/2021 01/20/2021 01/19/2021  Glucose 70 - 99 mg/dL 85 152(H) 161(H)  BUN 8 - 23 mg/dL 60(H) 51(H) 84(H)  Creatinine 0.61 - 1.24 mg/dL 9.07(H) 7.87(H) 12.12(H)  Sodium 135 - 145 mmol/L 133(L) 133(L) 128(L)  Potassium 3.5 - 5.1 mmol/L 3.7 3.6 3.8  Chloride 98 - 111 mmol/L 96(L) 96(L) 92(L)  CO2 22 - 32 mmol/L 25 23 18(L)  Calcium 8.9 - 10.3 mg/dL 8.0(L) 7.9(L) 7.8(L)  Total Protein 6.5 - 8.1 g/dL - - -  Total Bilirubin 0.3 - 1.2 mg/dL - - -  Alkaline Phos 38 - 126 U/L - - -  AST 15 - 41 U/L - - -  ALT 0 - 44 U/L - - -    Renal function Estimated Creatinine Clearance: 6.9 mL/min (A) (by C-G formula based on SCr of 9.07 mg/dL (H)).  Hgb A1c MFr Bld (%)  Date Value  01/16/2021 5.1    LDL Calculated  Date Value Ref Range Status  07/12/2016 158 (H) 0 - 99 mg/dL Final     Yevonne Aline. Stanford Breed, MD [facility] Phone Number: [phone] 01/21/2021 12:40 PM

## 2021-01-21 NOTE — Progress Notes (Signed)
Subjective:   Overnight Events: Patient was noted to have increasing pain in his left lower forearm with difficulty moving.  He was evaluated by her night team who ordered a CT that showed subcutaneous edema diffusely in the forearm.  This AM, [name] states that he wishes he was dead.  He clarifies that he does not wish to harm himself or have any plans to harm himself, but is very overwhelmed with his poor health recently.  He feels that his wife and child do not support him as they have not been visiting as often as he hopes.  He also notes that his left arm has been hurting him significantly and he has difficulty moving it at this point due to the pain.  He endorses numbness and tingling in the fingertips, but states that this is present all of his digits including toes.  His left arm paresthesia is not worse than the right.  Objective:  Vital signs in last 24 hours: Vitals:   01/20/21 0402 01/20/21 1541 01/20/21 2055 01/21/21 0434  BP: (!) 142/54 (!) 151/52 (!) 158/53 (!) 120/54  Pulse: 70 70 70 66  Resp: '17 18 17 17  '$ Temp: 99.7 F (37.6 C) 98.8 F (37.1 C) 98.9 F (37.2 C) 98.1 F (36.7 C)  TempSrc: Oral Oral Oral Oral  SpO2: 99% 100% 99% 100%  Weight:      Height:       Physical Exam Vitals and nursing note reviewed.  Constitutional:      General: He is not in acute distress.    Appearance: He is normal weight.  HENT:     Head: Normocephalic and atraumatic.  Cardiovascular:     Rate and Rhythm: Normal rate and regular rhythm.     Heart sounds: Murmur (systolic murmur, blowing) heard.  Abdominal:     General: Bowel sounds are normal.     Tenderness: There is no abdominal tenderness.  Genitourinary:    Comments: Left elbow with overlying effusion that is nontender, no increased warmth to touch. Skin:    Comments: Left forearm with significant induration and edema present circumferentially extending down to the digits.  Erythematous with tenderness to palpation.   Radial pulses intact.  Please see pictures below for details  Neurological:     General: No focal deficit present.     Mental Status: He is alert and oriented to person, place, and time.  Psychiatric:        Attention and Perception: Attention normal.        Mood and Affect: Mood is depressed.        Speech: Speech normal.        Behavior: Behavior normal. Behavior is cooperative.        Thought Content: Thought content does not include suicidal ideation. Thought content does not include suicidal plan.        Cognition and Memory: Cognition and memory normal.        Assessment/Plan:  Active Problems:   Intractable nausea and vomiting   Sepsis ([place])   Shock ([place])   MSSA bacteremia   ESRD needing dialysis ([place])   Gallstones   Nausea and vomiting in adult   Septic shock ([facility])   C. difficile diarrhea  Mr. Keane Scrape is a 65 year old Spanish-speaking gentleman with a past medical history of htn, DM2, ESRD on HD(MWF) who was admitted with septic shock and found to have MSSA bacteremia with suspected source LUE AV fistula.   # MSSA Bacteremia #  Septic shock (resolved)  Patient's course has been complicated by septic shock requiring pressor support and pulmonary septic emboli. Although patient's echos have been negative, the plan is to treat as endocarditis. Suspected source of bacteremia is patient's left arm AV fistula.   Over the past 24 hours, patient has had worsening in his left arm pain. CT imaging obtained yesterday showed significant soft tissue edema from the elbow down to the forearm. Given patient is day 7 of antibiotics, this is unusual. Repeat blood cultures have remained negative. Unfortunately we are unable to obtain the MRI as it was identified that there is metal in the patient's left upper arm.  Vascular surgery was consulted to evaluate the fistula site given patient will need dialysis tomorrow; there is concern that fistula may be stenosed and leading to this edema  with plan to obtain a fistulogram this week.  - Infectious Disease team following; appreciate their recommendations  - Continue Cefazolin per pharmacy  - Continue to trend CBC - Monitor for evidence of compartment syndrome of the left forearm  # C.Diff - Continue Fidaxomicin to complete 10 day course.   # A. fib  Secondary to active infection. Patient self-converted to sinus rhythm. No additional episodes noted. Patient's CHADs-Vasc score is 4; he may benefit from long-term anticoagulation for stroke prevention, however this will need to be discussed further once active infection resolves.   - Continue telemetry  # Hypotension  Stable.   - Continue Midodrine TID   # Type 2 Diabetes  - SSI  # ESRD on HD # Anemia of Renal Disease - Appreciate nephrology recommendations and management of HD  Prior to Admission Living Arrangement: Anticipated Discharge Location: Barriers to Discharge:  Dispo: Anticipated discharge in approximately 2-4 day(s).   Dr. Jose Persia Internal Medicine PGY-2  Pager: [phone] After 5pm on weekdays and 1pm on weekends: On Call pager [phone]  01/21/2021, 8:18 AM

## 2021-01-21 NOTE — Progress Notes (Signed)
Oxycodone helps alleviate pain in left arm, but pt still in a considerable amount of pain.  Pt complaint of pain in his right lower gluteal cheek. ID MD checked on hip movements (see his note).  Pt and wife notified me of issues with outpatient dialysis on [place]. Gave those issues to a Brewing technologist for further escalation.  Left arm size/color/temp did not change throughout dayshift. Still very painful.  Pt stated through interpreter that "You all (two doctors that had been in to see him before I got there and myself) want to help me get better, but I just want to die." Notified MD. Pt has orders for Spiritual Care consult.

## 2021-01-21 NOTE — Progress Notes (Signed)
Notified by RN that patient was unable to be taken for MRI as metal was found on prior CT imaging earlier this morning.  Appears as though patient has coil like structure in subcutaneous fat of left upper extremity.  Patient was once unaware of this, it appears that this was placed secondary to difficulties with his AV fistula.

## 2021-01-22 LAB — CBC WITH DIFFERENTIAL/PLATELET
Abs Immature Granulocytes: 0.6 10*3/uL — ABNORMAL HIGH (ref 0.00–0.07)
Basophils Absolute: 0.1 10*3/uL (ref 0.0–0.1)
Basophils Relative: 0 %
Eosinophils Absolute: 0.1 10*3/uL (ref 0.0–0.5)
Eosinophils Relative: 1 %
HCT: 27.6 % — ABNORMAL LOW (ref 39.0–52.0)
Hemoglobin: 9 g/dL — ABNORMAL LOW (ref 13.0–17.0)
Immature Granulocytes: 4 %
Lymphocytes Relative: 11 %
Lymphs Abs: 1.7 10*3/uL (ref 0.7–4.0)
MCH: 31.6 pg (ref 26.0–34.0)
MCHC: 32.6 g/dL (ref 30.0–36.0)
MCV: 96.8 fL (ref 80.0–100.0)
Monocytes Absolute: 0.9 10*3/uL (ref 0.1–1.0)
Monocytes Relative: 6 %
Neutro Abs: 11.5 10*3/uL — ABNORMAL HIGH (ref 1.7–7.7)
Neutrophils Relative %: 78 %
Platelets: 302 10*3/uL (ref 150–400)
RBC: 2.85 MIL/uL — ABNORMAL LOW (ref 4.22–5.81)
RDW: 13.8 % (ref 11.5–15.5)
WBC: 14.9 10*3/uL — ABNORMAL HIGH (ref 4.0–10.5)
nRBC: 0 % (ref 0.0–0.2)

## 2021-01-22 LAB — RENAL FUNCTION PANEL
Albumin: 2 g/dL — ABNORMAL LOW (ref 3.5–5.0)
Anion gap: 15 (ref 5–15)
BUN: 77 mg/dL — ABNORMAL HIGH (ref 8–23)
CO2: 22 mmol/L (ref 22–32)
Calcium: 7.9 mg/dL — ABNORMAL LOW (ref 8.9–10.3)
Chloride: 93 mmol/L — ABNORMAL LOW (ref 98–111)
Creatinine, Ser: 11.22 mg/dL — ABNORMAL HIGH (ref 0.61–1.24)
GFR, Estimated: 5 mL/min — ABNORMAL LOW (ref >60)
Glucose, Bld: 148 mg/dL — ABNORMAL HIGH (ref 70–99)
Phosphorus: 5.4 mg/dL — ABNORMAL HIGH (ref 2.5–4.6)
Potassium: 4.8 mmol/L (ref 3.5–5.1)
Sodium: 130 mmol/L — ABNORMAL LOW (ref 135–145)

## 2021-01-22 LAB — CULTURE, BLOOD (ROUTINE X 2)
Culture: NO GROWTH
Culture: NO GROWTH
Special Requests: ADEQUATE
Special Requests: ADEQUATE

## 2021-01-22 LAB — GLUCOSE, CAPILLARY
Glucose-Capillary: 144 mg/dL — ABNORMAL HIGH (ref 70–99)
Glucose-Capillary: 173 mg/dL — ABNORMAL HIGH (ref 70–99)
Glucose-Capillary: 142 mg/dL — ABNORMAL HIGH (ref 70–99)
Glucose-Capillary: 115 mg/dL — ABNORMAL HIGH (ref 70–99)
Glucose-Capillary: 140 mg/dL — ABNORMAL HIGH (ref 70–99)
Glucose-Capillary: 136 mg/dL — ABNORMAL HIGH (ref 70–99)

## 2021-01-22 MED ORDER — HEPARIN SODIUM (PORCINE) 5000 UNIT/ML IJ SOLN
5000.0000 [IU] | Freq: Three times a day (TID) | INTRAMUSCULAR | Status: DC
  Administered 2021-01-22 – 2021-01-30 (×24): 5000 [IU] via SUBCUTANEOUS
  Filled 2021-01-22 (×24): qty 1

## 2021-01-22 MED ORDER — PANTOPRAZOLE SODIUM 40 MG PO TBEC
40.0000 mg | DELAYED_RELEASE_TABLET | Freq: Once | ORAL | Status: AC
  Administered 2021-01-22: 40 mg via ORAL
  Filled 2021-01-22: qty 1

## 2021-01-22 MED ORDER — DOXERCALCIFEROL 4 MCG/2ML IV SOLN
INTRAVENOUS | Status: AC
  Administered 2021-01-22: 4 ug via INTRAVENOUS
  Filled 2021-01-22: qty 2

## 2021-01-22 MED ORDER — ACETAMINOPHEN 325 MG PO TABS
650.0000 mg | ORAL_TABLET | Freq: Four times a day (QID) | ORAL | Status: DC
  Administered 2021-01-22 – 2021-01-30 (×29): 650 mg via ORAL
  Filled 2021-01-22 (×29): qty 2

## 2021-01-22 NOTE — Plan of Care (Addendum)
Patient more vocal this morning, responding in Spanish to questions. Patient states pain medication (oxycodone) helped with pain, as well as heat pack. Patient states pain is from L arm to shoulder, as well as c/o back pain. Patient given a dose of Tylenol this am as well as heat pack. Patient lying in bed with eyes open, laying on R side with pillow support, semi-fowlers, side rails up, call bell within reach.  Problem: Education: Goal: Knowledge of General Education information will improve Description: Including pain rating scale, medication(s)/side effects and non-pharmacologic comfort measures Outcome: Progressing   Problem: Health Behavior/Discharge Planning: Goal: Ability to manage health-related needs will improve Outcome: Progressing   Problem: Clinical Measurements: Goal: Ability to maintain clinical measurements within normal limits will improve Outcome: Progressing Goal: Will remain free from infection Outcome: Progressing Goal: Diagnostic test results will improve Outcome: Progressing Goal: Respiratory complications will improve Outcome: Progressing Goal: Cardiovascular complication will be avoided Outcome: Progressing   Problem: Activity: Goal: Risk for activity intolerance will decrease Outcome: Progressing   Problem: Nutrition: Goal: Adequate nutrition will be maintained Outcome: Progressing   Problem: Coping: Goal: Level of anxiety will decrease Outcome: Progressing   Problem: Elimination: Goal: Will not experience complications related to bowel motility Outcome: Progressing Goal: Will not experience complications related to urinary retention Outcome: Progressing   Problem: Pain Managment: Goal: General experience of comfort will improve Outcome: Progressing   Problem: Safety: Goal: Ability to remain free from injury will improve Outcome: Progressing   Problem: Skin Integrity: Goal: Risk for impaired skin integrity will decrease Outcome:  Progressing

## 2021-01-22 NOTE — Plan of Care (Signed)
  Problem: Clinical Measurements: Goal: Respiratory complications will improve Outcome: Progressing  Alert and oriented x3, Pain controlled with oxycodone , Pt exhbitibng a wet non productive cough, 2l of oxygen [place]. Educated pt and family  on incentive spirometer, He could only attain 511m, introduced flutter valve that he did much better with.

## 2021-01-22 NOTE — Progress Notes (Signed)
Plan fistulagram Wednesday 01/24/21 at 10:30.  NPO p MN Tuesday 01/23/21.  Ryan Wells. Stanford Breed, MD [facility] Phone Number: [phone] 01/22/2021 11:07 AM

## 2021-01-22 NOTE — Plan of Care (Signed)

## 2021-01-22 NOTE — Progress Notes (Signed)
Subjective:  No acute events overnight.   Mr. Keane Scrape was evaluated at bedside in the HD suite with the assistance of a Spanish interpretor via telephone. He says that his left arm pain is much better today. The arm is no longer tender to touch. He continues to endorse numbness/tingling in BL fingers and toes that is unchanged. His back pain is improving as well, seems to be gradually better day by day. He describes his mood as "very good" today. He notes ongoing hiccups that are bothersome. Discussed plans to continue antibiotic regimen and obtain fistulagram.   Objective:  Vital signs in last 24 hours: Vitals:   01/22/21 1000 01/22/21 1030 01/22/21 1100 01/22/21 1130  BP: (!) 142/74 (!) 132/53 (!) 156/70 (!) 138/57  Pulse: 76 66 68 66  Resp:      Temp:      TempSrc:      SpO2:      Weight:      Height:       Weight change:   Intake/Output Summary (Last 24 hours) at 01/22/2021 1207 Last data filed at 01/22/2021 0020 Gross per 24 hour  Intake 330 ml  Output --  Net 330 ml   Physical Exam: General: Tired-appearing gentleman in no acute distress HENT: Normocephalic, atraumatic. Moist mucus membranes CV: Regular rate and rhythm with systolic murmur. BL hands are cool to touch Pulm: Lungs CTAB. No wheezes or crackles. Breathing comfortably with [place] Abd: Nontender, nondistended. Normoactive bowel sounds Derm: Edema localized to L forearm, not extending into L hand. L forearm is indurated, warm to touch. Nontender to palpation.  Neuro: Intermittently somnolent but easily awoken. No focal neurologic deficits Psych: Mood is "very good." Appropriate affect. Speech and cognition grossly intact  Labs:  CBC Latest Ref Rng & Units 01/22/2021 01/21/2021 01/20/2021  WBC 4.0 - 10.5 K/uL 14.9(H) 16.6(H) 14.6(H)  Hemoglobin 13.0 - 17.0 g/dL 9.0(L) 8.8(L) 9.0(L)  Hematocrit 39.0 - 52.0 % 27.6(L) 26.5(L) 27.6(L)  Platelets 150 - 400 K/uL 302 252 211    BMP Latest Ref Rng & Units  01/22/2021 01/21/2021 01/20/2021  Glucose 70 - 99 mg/dL 148(H) 85 152(H)  BUN 8 - 23 mg/dL 77(H) 60(H) 51(H)  Creatinine 0.61 - 1.24 mg/dL 11.22(H) 9.07(H) 7.87(H)  BUN/Creat Ratio 10 - 24 - - -  Sodium 135 - 145 mmol/L 130(L) 133(L) 133(L)  Potassium 3.5 - 5.1 mmol/L 4.8 3.7 3.6  Chloride 98 - 111 mmol/L 93(L) 96(L) 96(L)  CO2 22 - 32 mmol/L '22 25 23  '$ Calcium 8.9 - 10.3 mg/dL 7.9(L) 8.0(L) 7.9(L)   Assessment/Plan:  Active Problems:   Intractable nausea and vomiting   Sepsis ([place])   Shock ([place])   MSSA bacteremia   ESRD needing dialysis ([place])   Gallstones   Nausea and vomiting in adult   Septic shock (HCC)   C. difficile diarrhea  Mr. 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   #MSSA Bacteremia #Septic shock, resolved  Patient was transferred to ICU for septic shock requiring pressors with septic pulmonary emboli on CT. 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 Patient has been afebrile with white count trending down to 14.9 today and repeat blood cultures remaining negative. His LUE pain and back pain are improving and LUE edema is somewhat improved  on exam, with no tenderness to palpation today. Will continue antibiotics for now. Fistulagram is scheduled for 6/15 per vascular to evaluate for outflow stenosis of AV fistula.  - Appreciate ID involvement and recommendations - Continue cefazolin Start: 6/7 - Fistulagram on 6/15. Appreciate vascular recommendations - Continue to trend CBC  #C.diff - Continue fidaxomicin to complete 10 day course on 6/17  #ESRD on HD He received HD today - Appreciate nephrology recommendations and management of HD  Code Status: FULL Diet: renal/carb modified with fluid restriction  Fluids: none VTE ppx: subq Heparin    LOS: 7  days   Theodosia Blender, Medical Student 01/22/2021, 12:07 PM

## 2021-01-22 NOTE — Progress Notes (Signed)
[facility] for Infectious Disease  Date of Admission:  01/15/2021   Total days of antibiotics 8         ASSESSMENT: Patient appears clinically stable.  Afebrile overnight.  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  Patient will require 6 weeks of antibiotics for possible endocarditis.  Continue IV cefazolin for MSSA bacteremia  Patient reports of lower back pain.  MRI of lumbar on 6/8 was negative for osteomyelitis but cannot exclude superimposed septic arthritis.  No urgent need for surgery at this time, continue to monitor.  No indication for hip MRI.  CT scan of left upper extremity was negative for any abscess or infection.  PLAN: Continue IV cefazolin, which he will receives with HD after discharge Anticipate 6 weeks of antibiotics  Active Problems:   Intractable nausea and vomiting   Sepsis ([place])   Shock ([place])   MSSA bacteremia   ESRD needing dialysis ([place])   Gallstones   Nausea and vomiting in adult   Septic shock (HCC)   C. difficile diarrhea   Scheduled Meds:  Chlorhexidine Gluconate Cloth  6 each Topical Q0600   Chlorhexidine Gluconate Cloth  6 each Topical Q0600   doxercalciferol  4 mcg Intravenous Q M,W,F-HD   fidaxomicin  200 mg Oral BID   heparin  2,200 Units Dialysis Once in dialysis   insulin aspart  3-9 Units Subcutaneous Q4H   midodrine  10 mg Oral TID WC   multivitamin  1 tablet Oral QHS   sucroferric oxyhydroxide  1,000 mg Oral TID WC   Continuous Infusions:  sodium chloride Stopped (01/17/21 0414)   sodium chloride Stopped (01/19/21 0013)    ceFAZolin (ANCEF) IV Stopped (01/21/21 2319)   PRN Meds:.acetaminophen **OR** acetaminophen, alteplase, heparin, heparin, lidocaine (PF), lidocaine-prilocaine, ondansetron **OR** ondansetron (ZOFRAN) IV, oxyCODONE, pentafluoroprop-tetrafluoroeth   SUBJECTIVE: Patient was seen in HD.  He appears tired but in no acute  distress.  Reports lower back pain but denies hip pain.  Review of Systems: ROS Per HPI  No Known Allergies  OBJECTIVE: Vitals:   01/22/21 1100 01/22/21 1130 01/22/21 1200 01/22/21 1230  BP: (!) 156/70 (!) 138/57 (!) 112/53 (!) 117/55  Pulse: 68 66 70 70  Resp:      Temp:      TempSrc:      SpO2:      Weight:      Height:       Body mass index is 29.27 kg/m.  Physical Exam Constitutional:      General: He is not in acute distress. Eyes:     General: No scleral icterus.       Right eye: No discharge.        Left eye: No discharge.     Conjunctiva/sclera: Conjunctivae normal.  Pulmonary:     Effort: Pulmonary effort is normal. No respiratory distress.  Musculoskeletal:     Comments: Left upper extremity cool to touch.  No erythema or signs of infection. Bilateral hip nontender to palpation.  No pain to palpation of bilateral knees or foot  Skin:    General: Skin is warm.  Neurological:     Mental Status: He is alert. Mental status is at baseline.    Lab Results Lab Results  Component Value Date   WBC 14.9 (H) 01/22/2021   HGB 9.0 (L) 01/22/2021   HCT 27.6 (L) 01/22/2021   MCV 96.8 01/22/2021  PLT 302 01/22/2021    Lab Results  Component Value Date   CREATININE 11.22 (H) 01/22/2021   BUN 77 (H) 01/22/2021   NA 130 (L) 01/22/2021   K 4.8 01/22/2021   CL 93 (L) 01/22/2021   CO2 22 01/22/2021    Lab Results  Component Value Date   ALT 27 01/15/2021   AST 55 (H) 01/15/2021   ALKPHOS 92 01/15/2021   BILITOT 1.3 (H) 01/15/2021     Microbiology: Recent Results (from the past 240 hour(s))  Resp Panel by RT-PCR (Flu A&B, Covid) Nasopharyngeal Swab     Status: None   Collection Time: 01/15/21  9:52 AM   Specimen: Nasopharyngeal Swab; Nasopharyngeal(NP) swabs in vial transport medium  Result Value Ref Range Status   SARS Coronavirus 2 by RT PCR NEGATIVE NEGATIVE Final    Comment: (NOTE) SARS-CoV-2 target nucleic acids are NOT DETECTED.  The SARS-CoV-2  RNA is generally detectable in upper respiratory specimens during the acute phase of infection. The lowest concentration of SARS-CoV-2 viral copies this assay can detect is 138 copies/mL. A negative result does not preclude SARS-Cov-2 infection and should not be used as the sole basis for treatment or other patient management decisions. A negative result may occur with  improper specimen collection/handling, submission of specimen other than nasopharyngeal swab, presence of viral mutation(s) within the areas targeted by this assay, and inadequate number of viral copies(<138 copies/mL). A negative result must be combined with clinical observations, patient history, and epidemiological information. The expected result is Negative.  Fact Sheet for Patients:  EntrepreneurPulse.com.au  Fact Sheet for Healthcare Providers:  IncredibleEmployment.be  This test is no t yet approved or cleared by the [place] FDA and  has been authorized for detection and/or diagnosis of SARS-CoV-2 by FDA under an Emergency Use Authorization (EUA). This EUA will remain  in effect (meaning this test can be used) for the duration of the COVID-19 declaration under Section 564(b)(1) of the Act, 21 U.S.C.section 360bbb-3(b)(1), unless the authorization is terminated  or revoked sooner.       Influenza A by PCR NEGATIVE NEGATIVE Final   Influenza B by PCR NEGATIVE NEGATIVE Final    Comment: (NOTE) The Xpert Xpress SARS-CoV-2/FLU/RSV plus assay is intended as an aid in the diagnosis of influenza from Nasopharyngeal swab specimens and should not be used as a sole basis for treatment. Nasal washings and aspirates are unacceptable for Xpert Xpress SARS-CoV-2/FLU/RSV testing.  Fact Sheet for Patients: EntrepreneurPulse.com.au  Fact Sheet for Healthcare Providers: IncredibleEmployment.be  This test is not yet approved or cleared by the  [place] FDA and has been authorized for detection and/or diagnosis of SARS-CoV-2 by FDA under an Emergency Use Authorization (EUA). This EUA will remain in effect (meaning this test can be used) for the duration of the COVID-19 declaration under Section 564(b)(1) of the Act, 21 U.S.C. section 360bbb-3(b)(1), unless the authorization is terminated or revoked.  Performed at [facility], [place] [address], [place], [place] [address]   Culture, blood (routine x 2)     Status: Abnormal   Collection Time: 01/15/21  8:28 PM   Specimen: BLOOD RIGHT FOREARM  Result Value Ref Range Status   Specimen Description BLOOD RIGHT FOREARM  Final   Special Requests   Final    BOTTLES DRAWN AEROBIC AND ANAEROBIC Blood Culture results may not be optimal due to an inadequate volume of blood received in culture bottles   Culture  Setup Time   Final  GRAM POSITIVE COCCI IN CLUSTERS ANAEROBIC BOTTLE ONLY CRITICAL RESULT CALLED TO, READ BACK BY AND VERIFIED WITH: J. FRENS PHARMD,AT 1050 01/16/21 D. VANHOOK    Culture (A)  Final    STAPHYLOCOCCUS AUREUS SUSCEPTIBILITIES PERFORMED ON PREVIOUS CULTURE WITHIN THE LAST 5 DAYS. Performed at [facility], [place] [address] [place], [place] [address]    Report Status 01/18/2021 FINAL  Final  Culture, blood (routine x 2)     Status: Abnormal   Collection Time: 01/15/21  8:49 PM   Specimen: BLOOD  Result Value Ref Range Status   Specimen Description BLOOD RIGHT UPPER ARM  Final   Special Requests   Final    BOTTLES DRAWN AEROBIC AND ANAEROBIC Blood Culture adequate volume   Culture  Setup Time   Final    GRAM POSITIVE COCCI IN CLUSTERS IN BOTH AEROBIC AND ANAEROBIC BOTTLES CRITICAL RESULT CALLED TO, READ BACK BY AND VERIFIED WITH: J. FRENS PHARMD,AT 1050 01/16/21 [place] Performed at [facility], [place] [address], [place], [place] [address]    Culture STAPHYLOCOCCUS AUREUS (A)  Final   Report Status 01/18/2021 FINAL  Final   Organism ID,  Bacteria STAPHYLOCOCCUS AUREUS  Final      Susceptibility   Staphylococcus aureus - MIC*    CIPROFLOXACIN <=0.5 SENSITIVE Sensitive     ERYTHROMYCIN <=0.25 SENSITIVE Sensitive     GENTAMICIN <=0.5 SENSITIVE Sensitive     OXACILLIN 0.5 SENSITIVE Sensitive     TETRACYCLINE <=1 SENSITIVE Sensitive     VANCOMYCIN <=0.5 SENSITIVE Sensitive     TRIMETH/SULFA <=10 SENSITIVE Sensitive     CLINDAMYCIN <=0.25 SENSITIVE Sensitive     RIFAMPIN <=0.5 SENSITIVE Sensitive     Inducible Clindamycin NEGATIVE Sensitive     * STAPHYLOCOCCUS AUREUS  Blood Culture ID Panel (Reflexed)     Status: Abnormal   Collection Time: 01/15/21  8:49 PM  Result Value Ref Range Status   Enterococcus faecalis NOT DETECTED NOT DETECTED Final   Enterococcus Faecium NOT DETECTED NOT DETECTED Final   Listeria monocytogenes NOT DETECTED NOT DETECTED Final   Staphylococcus species DETECTED (A) NOT DETECTED Final    Comment: CRITICAL RESULT CALLED TO, READ BACK BY AND VERIFIED WITH: J. FRENS PHARMD,AT 1050 01/16/21 D. VANHOOK    Staphylococcus aureus (BCID) DETECTED (A) NOT DETECTED Final    Comment: CRITICAL RESULT CALLED TO, READ BACK BY AND VERIFIED WITH: J. FRENS PHARMD,AT [id number] 01/16/21 D. VANHOOK    Staphylococcus epidermidis NOT DETECTED NOT DETECTED Final   Staphylococcus lugdunensis NOT DETECTED NOT DETECTED Final   Streptococcus species NOT DETECTED NOT DETECTED Final   Streptococcus agalactiae NOT DETECTED NOT DETECTED Final   Streptococcus pneumoniae NOT DETECTED NOT DETECTED Final   Streptococcus pyogenes NOT DETECTED NOT DETECTED Final   A.calcoaceticus-baumannii NOT DETECTED NOT DETECTED Final   Bacteroides fragilis NOT DETECTED NOT DETECTED Final   Enterobacterales NOT DETECTED NOT DETECTED Final   Enterobacter cloacae complex NOT DETECTED NOT DETECTED Final   Escherichia coli NOT DETECTED NOT DETECTED Final   Klebsiella aerogenes NOT DETECTED NOT DETECTED Final   Klebsiella oxytoca NOT DETECTED NOT DETECTED  Final   Klebsiella pneumoniae NOT DETECTED NOT DETECTED Final   Proteus species NOT DETECTED NOT DETECTED Final   Salmonella species NOT DETECTED NOT DETECTED Final   Serratia marcescens NOT DETECTED NOT DETECTED Final   Haemophilus influenzae NOT DETECTED NOT DETECTED Final   Neisseria meningitidis NOT DETECTED NOT DETECTED Final   Pseudomonas aeruginosa NOT DETECTED NOT  DETECTED Final   Stenotrophomonas maltophilia NOT DETECTED NOT DETECTED Final   Candida albicans NOT DETECTED NOT DETECTED Final   Candida auris NOT DETECTED NOT DETECTED Final   Candida glabrata NOT DETECTED NOT DETECTED Final   Candida krusei NOT DETECTED NOT DETECTED Final   Candida parapsilosis NOT DETECTED NOT DETECTED Final   Candida tropicalis NOT DETECTED NOT DETECTED Final   Cryptococcus neoformans/gattii NOT DETECTED NOT DETECTED Final   Meth resistant mecA/C and MREJ NOT DETECTED NOT DETECTED Final    Comment: Performed at [facility], [place] [address] [place], [place] [address]  MRSA PCR Screening     Status: None   Collection Time: 01/16/21 12:50 AM   Specimen: Nasal Mucosa; Nasopharyngeal  Result Value Ref Range Status   MRSA by PCR NEGATIVE NEGATIVE Final    Comment:        The GeneXpert MRSA Assay (FDA approved for NASAL specimens only), is one component of a comprehensive MRSA colonization surveillance program. It is not intended to diagnose MRSA infection nor to guide or monitor treatment for MRSA infections. Performed at [facility], [place] [address], [place], [place] [address]   Gastrointestinal Panel by PCR , Stool     Status: None   Collection Time: 01/16/21  8:58 AM   Specimen: Stool  Result Value Ref Range Status   Campylobacter species NOT DETECTED NOT DETECTED Final   Plesimonas shigelloides NOT DETECTED NOT DETECTED Final   Salmonella species NOT DETECTED NOT DETECTED Final   Yersinia enterocolitica NOT DETECTED NOT DETECTED Final   Vibrio species NOT DETECTED NOT  DETECTED Final   Vibrio cholerae NOT DETECTED NOT DETECTED Final   Enteroaggregative E coli (EAEC) NOT DETECTED NOT DETECTED Final   Enteropathogenic E coli (EPEC) NOT DETECTED NOT DETECTED Final   Enterotoxigenic E coli (ETEC) NOT DETECTED NOT DETECTED Final   Shiga like toxin producing E coli (STEC) NOT DETECTED NOT DETECTED Final   Shigella/Enteroinvasive E coli (EIEC) NOT DETECTED NOT DETECTED Final   Cryptosporidium NOT DETECTED NOT DETECTED Final   Cyclospora [place]ensis NOT DETECTED NOT DETECTED Final   Entamoeba histolytica NOT DETECTED NOT DETECTED Final   Giardia lamblia NOT DETECTED NOT DETECTED Final   Adenovirus F40/41 NOT DETECTED NOT DETECTED Final   Astrovirus NOT DETECTED NOT DETECTED Final   Norovirus GI/GII NOT DETECTED NOT DETECTED Final   Rotavirus A NOT DETECTED NOT DETECTED Final   Sapovirus (I, II, IV, and V) NOT DETECTED NOT DETECTED Final    Comment: Performed at [facility], [place] [place], [place] [address]  C Difficile Quick Screen w PCR reflex     Status: Abnormal   Collection Time: 01/16/21  8:58 AM   Specimen: STOOL  Result Value Ref Range Status   C Diff antigen POSITIVE (A) NEGATIVE Final   C Diff toxin NEGATIVE NEGATIVE Final   C Diff interpretation Results are indeterminate. See PCR results.  Final    Comment: Performed at [facility], [place] [address], [place], [place] [address]  C. Diff by PCR, Reflexed     Status: Abnormal   Collection Time: 01/16/21  8:58 AM  Result Value Ref Range Status   Toxigenic C. Difficile by PCR POSITIVE (A) NEGATIVE Final    Comment: Positive for toxigenic C. difficile with little to no toxin production. Only treat if clinical presentation suggests symptomatic illness. Performed at [facility], [place] [address] [place], [place] [address]   Culture,  blood (Routine X 2) w Reflex to ID Panel     Status: None   Collection Time: 01/17/21  6:59 AM   Specimen: BLOOD RIGHT HAND  Result Value  Ref Range Status   Specimen Description BLOOD RIGHT HAND  Final   Special Requests   Final    BOTTLES DRAWN AEROBIC AND ANAEROBIC Blood Culture adequate volume   Culture   Final    NO GROWTH 5 DAYS Performed at [facility], [address] [place], [place] [address]    Report Status 01/22/2021 FINAL  Final  Culture, blood (Routine X 2) w Reflex to ID Panel     Status: None   Collection Time: 01/17/21  6:59 AM   Specimen: BLOOD RIGHT WRIST  Result Value Ref Range Status   Specimen Description BLOOD RIGHT WRIST  Final   Special Requests   Final    BOTTLES DRAWN AEROBIC AND ANAEROBIC Blood Culture adequate volume   Culture   Final    NO GROWTH 5 DAYS Performed at [facility], [place] [address], [place], [place] [address]    Report Status 01/22/2021 FINAL  Final  Culture, blood (routine x 2)     Status: None (Preliminary result)   Collection Time: 01/19/21  4:46 AM   Specimen: BLOOD RIGHT HAND  Result Value Ref Range Status   Specimen Description BLOOD RIGHT HAND  Final   Special Requests   Final    BOTTLES DRAWN AEROBIC ONLY Blood Culture adequate volume   Culture   Final    NO GROWTH 3 DAYS Performed at [facility], [place] [address] [place], [place] [address]    Report Status PENDING  Incomplete  Culture, blood (routine x 2)     Status: None (Preliminary result)   Collection Time: 01/19/21  4:46 AM   Specimen: BLOOD RIGHT HAND  Result Value Ref Range Status   Specimen Description BLOOD RIGHT HAND  Final   Special Requests   Final    BOTTLES DRAWN AEROBIC ONLY Blood Culture adequate volume   Culture   Final    NO GROWTH 3 DAYS Performed at [facility], [place] [address] [place], [place] [address]    Report Status PENDING  Incomplete    Gaylan Gerold, [facility] for Infectious [place] [phone] pager   [phone] cell 01/22/2021, 1:39 PM

## 2021-01-22 NOTE — Progress Notes (Signed)
[facility] Progress Note   Subjective: Seen on HD, tolerating well. C/O pain LUE otherwise seems stable.   Objective Vitals:   01/21/21 1428 01/21/21 2000 01/22/21 0555 01/22/21 0842  BP: 129/61 122/63 134/61 122/60  Pulse: 72 70 75 69  Resp: '17 17 15 16  '$ Temp: 99.6 F (37.6 C) 98.9 F (37.2 C) 98.3 F (36.8 C) 98.9 F (37.2 C)  TempSrc: Oral Oral Oral Oral  SpO2: 100% 100% 100% 100%  Weight:      Height:       Physical Exam General: Chronically ill appearing male in NAD Heart: S1,S2 RRR No M/R/G Lungs: CTAB  Abdomen: S, NT Extremities: No LE edema Dialysis Access: L AVF cannulated    Additional Objective Labs: Basic Metabolic Panel: Recent Labs  Lab 01/20/21 0856 01/21/21 0132 01/22/21 0248  NA 133* 133* 130*  K 3.6 3.7 4.8  CL 96* 96* 93*  CO2 '23 25 22  '$ GLUCOSE 152* 85 148*  BUN 51* 60* 77*  CREATININE 7.87* 9.07* 11.22*  CALCIUM 7.9* 8.0* 7.9*  PHOS 3.5 4.5 5.4*   Liver Function Tests: Recent Labs  Lab 01/15/21 2049 01/16/21 0103 01/20/21 0856 01/21/21 0132 01/22/21 0248  AST 55*  --   --   --   --   ALT 27  --   --   --   --   ALKPHOS 92  --   --   --   --   BILITOT 1.3*  --   --   --   --   PROT 6.7  --   --   --   --   ALBUMIN 3.1*   < > 2.3* 2.1* 2.0*   < > = values in this interval not displayed.   No results for input(s): LIPASE, AMYLASE in the last 168 hours. CBC: Recent Labs  Lab 01/18/21 0026 01/19/21 0446 01/20/21 0856 01/21/21 0132 01/22/21 0248  WBC 11.3* 14.0* 14.6* 16.6* 14.9*  NEUTROABS  --   --  11.3* 12.4* 11.5*  HGB 9.4* 8.9* 9.0* 8.8* 9.0*  HCT 28.0* 26.7* 27.6* 26.5* 27.6*  MCV 93.6 95.0 96.8 96.7 96.8  PLT 78* 136* 211 252 302   Blood Culture    Component Value Date/Time   SDES BLOOD RIGHT HAND 01/19/2021 0446   SDES BLOOD RIGHT HAND 01/19/2021 0446   SPECREQUEST  01/19/2021 0446    BOTTLES DRAWN AEROBIC ONLY Blood Culture adequate volume   SPECREQUEST  01/19/2021 0446    BOTTLES DRAWN  AEROBIC ONLY Blood Culture adequate volume   CULT  01/19/2021 0446    NO GROWTH 3 DAYS Performed at [facility], [place] [address] [place], [place] [address]    CULT  01/19/2021 0446    NO GROWTH 3 DAYS Performed at [facility], [place] [address], [place],  [address]    REPTSTATUS PENDING 01/19/2021 0446   REPTSTATUS PENDING 01/19/2021 0446    Cardiac Enzymes: Recent Labs  Lab 01/21/21 0132  CKTOTAL 58   CBG: Recent Labs  Lab 01/21/21 1959 01/21/21 2347 01/22/21 0252 01/22/21 0550 01/22/21 0740  GLUCAP 77 158* 144* 173* 142*   Iron Studies: No results for input(s): IRON, TIBC, TRANSFERRIN, FERRITIN in the last 72 hours. '@lablastinr3'$ @ Studies/Results: VAS [place] UPPER EXTREMITY VENOUS DUPLEX  Result Date: 01/20/2021 UPPER VENOUS STUDY  Patient Name:  Ryan Wells  Date of Exam:   01/20/2021 Medical Rec #: [id number]  Accession #:    [id number] Date of Birth: [birth year]              Patient Gender: M Patient Age:   65Y Exam Location:  [facility] Procedure:      VAS [place] UPPER EXTREMITY VENOUS DUPLEX Referring Phys: [id number] [facility] NARENDRA --------------------------------------------------------------------------------  Indications: LT upper extremity swelling in patient with AVF. 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 Performing Technologist: Sharion Dove RVS  Examination Guidelines: A complete evaluation includes B-mode imaging, spectral Doppler, color Doppler, and power Doppler as needed of all accessible portions of each vessel. Bilateral testing is considered an integral part of a complete examination. Limited examinations for reoccurring indications may be performed as noted.  Right Findings: +----------+------------+---------+-----------+----------+-------+ RIGHT      CompressiblePhasicitySpontaneousPropertiesSummary +----------+------------+---------+-----------+----------+-------+ Subclavian               Yes       Yes                      +----------+------------+---------+-----------+----------+-------+  Left Findings: +----------+------------+---------+-----------+----------+----------+ LEFT      CompressiblePhasicitySpontaneousProperties Summary   +----------+------------+---------+-----------+----------+----------+ IJV           Full       Yes       Yes                         +----------+------------+---------+-----------+----------+----------+ Subclavian    Full       Yes       Yes                         +----------+------------+---------+-----------+----------+----------+ Axillary                 Yes       Yes                         +----------+------------+---------+-----------+----------+----------+ Brachial                 Yes       Yes                         +----------+------------+---------+-----------+----------+----------+ Radial        Full                                             +----------+------------+---------+-----------+----------+----------+ Ulnar         Full                                             +----------+------------+---------+-----------+----------+----------+ Cephalic      Full                                             +----------+------------+---------+-----------+----------+----------+  Basilic                                             AVF patent +----------+------------+---------+-----------+----------+----------+  Summary:  Right: No evidence of thrombosis in the subclavian.  Left: No evidence of deep vein thrombosis in the upper extremity. No evidence of superficial vein thrombosis in the upper extremity.  *See table(s) above for measurements and observations.  Diagnosing physician: Jamelle Haring Electronically signed by Jamelle Haring on 01/20/2021 at  7:17:42 PM.    Final    CT Extrem Up Entire Arm L WO/CM  Addendum Date: 01/21/2021   ADDENDUM REPORT: 01/21/2021 13:55 ADDENDUM: There is an incidental 5.2 cm wire-like radiopaque foreign body in the subcutaneous fat of the left anterior upper arm, possibly an embolization coil, well superior to the soft tissue infection around the left elbow. There is no fluid collection or inflammatory change surrounding the foreign body. Electronically Signed   By: Titus Dubin M.D.   On: 01/21/2021 13:55   Result Date: 01/21/2021 CLINICAL DATA:  Soft tissue infection or underlying abscess . Not improving with antibiotics. EXAM: CT OF THE UPPER LEFT EXTREMITY WITHOUT CONTRAST TECHNIQUE: Multidetector CT imaging of the upper left extremity was performed according to the standard protocol. COMPARISON:  None. FINDINGS: Bones/Joint/Cartilage No cortical erosion or destruction. No acute displaced fracture or dislocation of the visualized bones of the left upper extremity. Ligaments Suboptimally assessed by CT. Muscles and Tendons Grossly unremarkable. Soft tissues Diffuse distal arm and forearm subcutaneus soft tissue edema with mild dermal thickening along the volar aspect of the elbow. No organized fluid collection. No subcutaneus soft tissue emphysema. There is a soft tissue defect along the distal left forearm measuring approximately 3 cm (4:109). 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 IMPRESSION: 1. Subcutaneus soft tissue edema and dermal thickening with no associated organized fluid. 2. No acute osseous abnormality. Electronically Signed: By: Iven Finn M.D. On: 01/20/2021 20:41   Medications:  sodium chloride Stopped (01/17/21 0414)   sodium chloride Stopped (01/19/21 0013)    ceFAZolin (ANCEF) IV Stopped (01/21/21 2319)    Chlorhexidine Gluconate Cloth  6 each Topical Q0600   Chlorhexidine Gluconate Cloth  6 each Topical Q0600   doxercalciferol   4 mcg Intravenous Q M,W,F-HD   fidaxomicin  200 mg Oral BID   heparin  2,200 Units Dialysis Once in dialysis   insulin aspart  3-9 Units Subcutaneous Q4H   midodrine  10 mg Oral TID WC   multivitamin  1 tablet Oral QHS   sucroferric oxyhydroxide  1,000 mg Oral TID WC     HD Orders: [facility] MWF 3h 88mn   400/500   68kg   2/2 bath  P4  AVF   -Heparin  2200  units IV TIW -Hectorol 4 mcg IV TIW   Assessment/ Plan: Septic shock - due to MSSA bacteremia. Shock resolved. Unclear source of sepsis, no signs of AVF infection by exam. Seen by ID and currently on cefazolin. TEE 6/10 was negative for vegetation. Swelling LUE CT without evidence of abscess. F'gram being arranged. Will require 6-8 weeks ABX as OP on discharge.  ESRD: MWF HD. Next HD 01/22/21 on schedule.  BP/ volume - 3kg up, +LUE edema, mild other edema. Max UF w/ HD today. Started midodrine 10 tid here > will lower to 5 mg tid.  BP's good.  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 Continue to monitor.  Transfuse prn. Not on ESA as outpatient. Secondary hyperparathyroidism: Phosphorus level acceptable.  Continue binders, vitamin D.   Hyponatremia - resolved. Cdif diarrhea - getting Dificid per ID, diarrhea better.   Kyley Solow H. Laree Garron NP-C 01/22/2021, 9:07 AM  [employer] [phone]

## 2021-01-22 NOTE — Progress Notes (Signed)
Report given to [facility]mo dialysis Last VSS, Left arm swollen and painful. Per yesterdays vascular note no infection on fistula. Spanish speaking and will need a Optometrist.

## 2021-01-23 DIAGNOSIS — R1310 Dysphagia, unspecified: Secondary | ICD-10-CM

## 2021-01-23 LAB — GLUCOSE, CAPILLARY
Glucose-Capillary: 76 mg/dL (ref 70–99)
Glucose-Capillary: 102 mg/dL — ABNORMAL HIGH (ref 70–99)
Glucose-Capillary: 111 mg/dL — ABNORMAL HIGH (ref 70–99)
Glucose-Capillary: 123 mg/dL — ABNORMAL HIGH (ref 70–99)
Glucose-Capillary: 147 mg/dL — ABNORMAL HIGH (ref 70–99)
Glucose-Capillary: 160 mg/dL — ABNORMAL HIGH (ref 70–99)

## 2021-01-23 LAB — CBC WITH DIFFERENTIAL/PLATELET
Abs Immature Granulocytes: 0.41 10*3/uL — ABNORMAL HIGH (ref 0.00–0.07)
Basophils Absolute: 0.1 10*3/uL (ref 0.0–0.1)
Basophils Relative: 1 %
Eosinophils Absolute: 0.1 10*3/uL (ref 0.0–0.5)
Eosinophils Relative: 1 %
HCT: 29.3 % — ABNORMAL LOW (ref 39.0–52.0)
Hemoglobin: 9.3 g/dL — ABNORMAL LOW (ref 13.0–17.0)
Immature Granulocytes: 4 %
Lymphocytes Relative: 11 %
Lymphs Abs: 1.2 10*3/uL (ref 0.7–4.0)
MCH: 31.3 pg (ref 26.0–34.0)
MCHC: 31.7 g/dL (ref 30.0–36.0)
MCV: 98.7 fL (ref 80.0–100.0)
Monocytes Absolute: 0.8 10*3/uL (ref 0.1–1.0)
Monocytes Relative: 7 %
Neutro Abs: 8.3 10*3/uL — ABNORMAL HIGH (ref 1.7–7.7)
Neutrophils Relative %: 76 %
Platelets: 306 10*3/uL (ref 150–400)
RBC: 2.97 MIL/uL — ABNORMAL LOW (ref 4.22–5.81)
RDW: 13.9 % (ref 11.5–15.5)
WBC: 10.9 10*3/uL — ABNORMAL HIGH (ref 4.0–10.5)
nRBC: 0 % (ref 0.0–0.2)

## 2021-01-23 MED ORDER — CEFAZOLIN SODIUM-DEXTROSE 2-4 GM/100ML-% IV SOLN
2.0000 g | INTRAVENOUS | Status: DC
  Administered 2021-01-24 – 2021-01-26 (×2): 2 g via INTRAVENOUS
  Filled 2021-01-23 (×4): qty 100

## 2021-01-23 NOTE — Progress Notes (Signed)
[facility]  Date of Admission:  01/15/2021   Total days of antibiotics 9         ASSESSMENT: Patient is clinically stable today.  Afebrile overnight.  No evidence of infection of left upper extremity.  Planned fistulogram today for possible outflow stenosis.  Will continue IV cefazolin for 6 weeks after discharge.  He will receive IV cefazolin with HD.  With resolution of diarrhea, will stop the Fidaxomicin  End date: 02/28/2021  PLAN: IV cefazolin with HD for 6 weeks Follow-up with the clinic after discharge  Active Problems:   Intractable nausea and vomiting   Sepsis ([place])   Shock ([place])   MSSA bacteremia   ESRD needing dialysis ([place])   Gallstones   Nausea and vomiting in adult   Septic shock (HCC)   C. difficile diarrhea   Scheduled Meds:  acetaminophen  650 mg Oral Q6H   Chlorhexidine Gluconate Cloth  6 each Topical Q0600   Chlorhexidine Gluconate Cloth  6 each Topical Q0600   doxercalciferol  4 mcg Intravenous Q M,W,F-HD   heparin  2,200 Units Dialysis Once in dialysis   heparin injection (subcutaneous)  5,000 Units Subcutaneous Q8H   insulin aspart  3-9 Units Subcutaneous Q4H   midodrine  10 mg Oral TID WC   multivitamin  1 tablet Oral QHS   sucroferric oxyhydroxide  1,000 mg Oral TID WC   Continuous Infusions:  sodium chloride Stopped (01/17/21 0414)   sodium chloride Stopped (01/19/21 0013)    ceFAZolin (ANCEF) IV Stopped (01/22/21 1903)   [START ON 01/24/2021]  ceFAZolin (ANCEF) IV     PRN Meds:.alteplase, heparin, heparin, lidocaine (PF), lidocaine-prilocaine, ondansetron **OR** ondansetron (ZOFRAN) IV, oxyCODONE, pentafluoroprop-tetrafluoroeth   SUBJECTIVE: Patient is seen at bedside.  States that his diarrhea has resolved.  He is currently constipated.  Review of Systems: ROS Per HPI  No Known Allergies  OBJECTIVE: Vitals:   01/23/21 0438 01/23/21 0500 01/23/21 0828 01/23/21 1245  BP: 118/63  125/67 (!)  130/54  Pulse: 65  65 71  Resp: 17     Temp: 98.7 F (37.1 C)     TempSrc: Oral     SpO2: 100%  100%   Weight:  69.8 kg    Height:       Body mass index is 28.15 kg/m.  Physical Exam Constitutional:      General: He is not in acute distress.    Appearance: He is not toxic-appearing.  HENT:     Head: Normocephalic.  Eyes:     General: No scleral icterus.       Right eye: No discharge.        Left eye: No discharge.     Conjunctiva/sclera: Conjunctivae normal.  Pulmonary:     Effort: Pulmonary effort is normal. No respiratory distress.  Musculoskeletal:     Comments: Left UE edematous.  Not erythematous and nontender to palpation  Skin:    General: Skin is warm.     Coloration: Skin is not jaundiced.  Neurological:     Mental Status: He is alert.  Psychiatric:        Mood and Affect: Mood normal.        Thought Content: Thought content normal.        Judgment: Judgment normal.    Lab Results Lab Results  Component Value Date   WBC 10.9 (H) 01/23/2021   HGB 9.3 (L) 01/23/2021   HCT 29.3 (  L) 01/23/2021   MCV 98.7 01/23/2021   PLT 306 01/23/2021    Lab Results  Component Value Date   CREATININE 11.22 (H) 01/22/2021   BUN 77 (H) 01/22/2021   NA 130 (L) 01/22/2021   K 4.8 01/22/2021   CL 93 (L) 01/22/2021   CO2 22 01/22/2021    Lab Results  Component Value Date   ALT 27 01/15/2021   AST 55 (H) 01/15/2021   ALKPHOS 92 01/15/2021   BILITOT 1.3 (H) 01/15/2021     Microbiology: Recent Results (from the past 240 hour(s))  Resp Panel by RT-PCR (Flu A&B, Covid) Nasopharyngeal Swab     Status: None   Collection Time: 01/15/21  9:52 AM   Specimen: Nasopharyngeal Swab; Nasopharyngeal(NP) swabs in vial transport medium  Result Value Ref Range Status   SARS Coronavirus 2 by RT PCR NEGATIVE NEGATIVE Final    Comment: (NOTE) SARS-CoV-2 target nucleic acids are NOT DETECTED.  The SARS-CoV-2 RNA is generally detectable in upper respiratory specimens during the  acute phase of infection. The lowest concentration of SARS-CoV-2 viral copies this assay can detect is 138 copies/mL. A negative result does not preclude SARS-Cov-2 infection and should not be used as the sole basis for treatment or other patient management decisions. A negative result may occur with  improper specimen collection/handling, submission of specimen other than nasopharyngeal swab, presence of viral mutation(s) within the areas targeted by this assay, and inadequate number of viral copies(<138 copies/mL). A negative result must be combined with clinical observations, patient history, and epidemiological information. The expected result is Negative.  Fact Sheet for Patients:  EntrepreneurPulse.com.au  Fact Sheet for Healthcare Providers:  IncredibleEmployment.be  This test is no t yet approved or cleared by the [place] FDA and  has been authorized for detection and/or diagnosis of SARS-CoV-2 by FDA under an Emergency Use Authorization (EUA). This EUA will remain  in effect (meaning this test can be used) for the duration of the COVID-19 declaration under Section 564(b)(1) of the Act, 21 U.S.C.section 360bbb-3(b)(1), unless the authorization is terminated  or revoked sooner.       Influenza A by PCR NEGATIVE NEGATIVE Final   Influenza B by PCR NEGATIVE NEGATIVE Final    Comment: (NOTE) The Xpert Xpress SARS-CoV-2/FLU/RSV plus assay is intended as an aid in the diagnosis of influenza from Nasopharyngeal swab specimens and should not be used as a sole basis for treatment. Nasal washings and aspirates are unacceptable for Xpert Xpress SARS-CoV-2/FLU/RSV testing.  Fact Sheet for Patients: EntrepreneurPulse.com.au  Fact Sheet for Healthcare Providers: IncredibleEmployment.be  This test is not yet approved or cleared by the [place] FDA and has been authorized for detection and/or  diagnosis of SARS-CoV-2 by FDA under an Emergency Use Authorization (EUA). This EUA will remain in effect (meaning this test can be used) for the duration of the COVID-19 declaration under Section 564(b)(1) of the Act, 21 U.S.C. section 360bbb-3(b)(1), unless the authorization is terminated or revoked.  Performed at [facility], [place] [address] [place], [place] [address]   Culture, blood (routine x 2)     Status: Abnormal   Collection Time: 01/15/21  8:28 PM   Specimen: BLOOD RIGHT FOREARM  Result Value Ref Range Status   Specimen Description BLOOD RIGHT FOREARM  Final   Special Requests   Final    BOTTLES DRAWN AEROBIC AND ANAEROBIC Blood Culture results may not be optimal due to an inadequate volume of blood received in culture bottles  Culture  Setup Time   Final    GRAM POSITIVE COCCI IN CLUSTERS ANAEROBIC BOTTLE ONLY CRITICAL RESULT CALLED TO, READ BACK BY AND VERIFIED WITH: J. FRENS PHARMD,AT 1050 01/16/21 D. VANHOOK    Culture (A)  Final    STAPHYLOCOCCUS AUREUS SUSCEPTIBILITIES PERFORMED ON PREVIOUS CULTURE WITHIN THE LAST 5 DAYS. Performed at [facility], [place] [address] [place], [place] [address]    Report Status 01/18/2021 FINAL  Final  Culture, blood (routine x 2)     Status: Abnormal   Collection Time: 01/15/21  8:49 PM   Specimen: BLOOD  Result Value Ref Range Status   Specimen Description BLOOD RIGHT UPPER ARM  Final   Special Requests   Final    BOTTLES DRAWN AEROBIC AND ANAEROBIC Blood Culture adequate volume   Culture  Setup Time   Final    GRAM POSITIVE COCCI IN CLUSTERS IN BOTH AEROBIC AND ANAEROBIC BOTTLES CRITICAL RESULT CALLED TO, READ BACK BY AND VERIFIED WITH: J. FRENS PHARMD,AT 1050 01/16/21 [place] Performed at [facility], [place] [address], [place], [place] [address]    Culture STAPHYLOCOCCUS AUREUS (A)  Final   Report Status 01/18/2021 FINAL  Final   Organism ID, Bacteria STAPHYLOCOCCUS AUREUS  Final      Susceptibility    Staphylococcus aureus - MIC*    CIPROFLOXACIN <=0.5 SENSITIVE Sensitive     ERYTHROMYCIN <=0.25 SENSITIVE Sensitive     GENTAMICIN <=0.5 SENSITIVE Sensitive     OXACILLIN 0.5 SENSITIVE Sensitive     TETRACYCLINE <=1 SENSITIVE Sensitive     VANCOMYCIN <=0.5 SENSITIVE Sensitive     TRIMETH/SULFA <=10 SENSITIVE Sensitive     CLINDAMYCIN <=0.25 SENSITIVE Sensitive     RIFAMPIN <=0.5 SENSITIVE Sensitive     Inducible Clindamycin NEGATIVE Sensitive     * STAPHYLOCOCCUS AUREUS  Blood Culture ID Panel (Reflexed)     Status: Abnormal   Collection Time: 01/15/21  8:49 PM  Result Value Ref Range Status   Enterococcus faecalis NOT DETECTED NOT DETECTED Final   Enterococcus Faecium NOT DETECTED NOT DETECTED Final   Listeria monocytogenes NOT DETECTED NOT DETECTED Final   Staphylococcus species DETECTED (A) NOT DETECTED Final    Comment: CRITICAL RESULT CALLED TO, READ BACK BY AND VERIFIED WITH: J. FRENS PHARMD,AT 1050 01/16/21 D. VANHOOK    Staphylococcus aureus (BCID) DETECTED (A) NOT DETECTED Final    Comment: CRITICAL RESULT CALLED TO, READ BACK BY AND VERIFIED WITH: J. FRENS PHARMD,AT [id number] 01/16/21 D. VANHOOK    Staphylococcus epidermidis NOT DETECTED NOT DETECTED Final   Staphylococcus lugdunensis NOT DETECTED NOT DETECTED Final   Streptococcus species NOT DETECTED NOT DETECTED Final   Streptococcus agalactiae NOT DETECTED NOT DETECTED Final   Streptococcus pneumoniae NOT DETECTED NOT DETECTED Final   Streptococcus pyogenes NOT DETECTED NOT DETECTED Final   A.calcoaceticus-baumannii NOT DETECTED NOT DETECTED Final   Bacteroides fragilis NOT DETECTED NOT DETECTED Final   Enterobacterales NOT DETECTED NOT DETECTED Final   Enterobacter cloacae complex NOT DETECTED NOT DETECTED Final   Escherichia coli NOT DETECTED NOT DETECTED Final   Klebsiella aerogenes NOT DETECTED NOT DETECTED Final   Klebsiella oxytoca NOT DETECTED NOT DETECTED Final   Klebsiella pneumoniae NOT DETECTED NOT DETECTED  Final   Proteus species NOT DETECTED NOT DETECTED Final   Salmonella species NOT DETECTED NOT DETECTED Final   Serratia marcescens NOT DETECTED NOT DETECTED Final   Haemophilus influenzae NOT DETECTED NOT DETECTED Final   Neisseria meningitidis NOT DETECTED  NOT DETECTED Final   Pseudomonas aeruginosa NOT DETECTED NOT DETECTED Final   Stenotrophomonas maltophilia NOT DETECTED NOT DETECTED Final   Candida albicans NOT DETECTED NOT DETECTED Final   Candida auris NOT DETECTED NOT DETECTED Final   Candida glabrata NOT DETECTED NOT DETECTED Final   Candida krusei NOT DETECTED NOT DETECTED Final   Candida parapsilosis NOT DETECTED NOT DETECTED Final   Candida tropicalis NOT DETECTED NOT DETECTED Final   Cryptococcus neoformans/gattii NOT DETECTED NOT DETECTED Final   Meth resistant mecA/C and MREJ NOT DETECTED NOT DETECTED Final    Comment: Performed at [facility], [place] [address], [place], [place] [address]  MRSA PCR Screening     Status: None   Collection Time: 01/16/21 12:50 AM   Specimen: Nasal Mucosa; Nasopharyngeal  Result Value Ref Range Status   MRSA by PCR NEGATIVE NEGATIVE Final    Comment:        The GeneXpert MRSA Assay (FDA approved for NASAL specimens only), is one component of a comprehensive MRSA colonization surveillance program. It is not intended to diagnose MRSA infection nor to guide or monitor treatment for MRSA infections. Performed at [facility], [place] [address] [place], [place] [address]   Gastrointestinal Panel by PCR , Stool     Status: None   Collection Time: 01/16/21  8:58 AM   Specimen: Stool  Result Value Ref Range Status   Campylobacter species NOT DETECTED NOT DETECTED Final   Plesimonas shigelloides NOT DETECTED NOT DETECTED Final   Salmonella species NOT DETECTED NOT DETECTED Final   Yersinia enterocolitica NOT DETECTED NOT DETECTED Final   Vibrio species NOT DETECTED NOT DETECTED Final   Vibrio cholerae NOT DETECTED NOT DETECTED  Final   Enteroaggregative E coli (EAEC) NOT DETECTED NOT DETECTED Final   Enteropathogenic E coli (EPEC) NOT DETECTED NOT DETECTED Final   Enterotoxigenic E coli (ETEC) NOT DETECTED NOT DETECTED Final   Shiga like toxin producing E coli (STEC) NOT DETECTED NOT DETECTED Final   Shigella/Enteroinvasive E coli (EIEC) NOT DETECTED NOT DETECTED Final   Cryptosporidium NOT DETECTED NOT DETECTED Final   Cyclospora cayetanensis NOT DETECTED NOT DETECTED Final   Entamoeba histolytica NOT DETECTED NOT DETECTED Final   Giardia lamblia NOT DETECTED NOT DETECTED Final   Adenovirus F40/41 NOT DETECTED NOT DETECTED Final   Astrovirus NOT DETECTED NOT DETECTED Final   Norovirus GI/GII NOT DETECTED NOT DETECTED Final   Rotavirus A NOT DETECTED NOT DETECTED Final   Sapovirus (I, II, IV, and V) NOT DETECTED NOT DETECTED Final    Comment: Performed at [facility], [address] [place], [place] [address]  C Difficile Quick Screen w PCR reflex     Status: Abnormal   Collection Time: 01/16/21  8:58 AM   Specimen: STOOL  Result Value Ref Range Status   C Diff antigen POSITIVE (A) NEGATIVE Final   C Diff toxin NEGATIVE NEGATIVE Final   C Diff interpretation Results are indeterminate. See PCR results.  Final    Comment: Performed at [facility], [place] [address] [place], [place] [address]  C. Diff by PCR, Reflexed     Status: Abnormal   Collection Time: 01/16/21  8:58 AM  Result Value Ref Range Status   Toxigenic C. Difficile by PCR POSITIVE (A) NEGATIVE Final    Comment: Positive for toxigenic C. difficile with little to no toxin production. Only treat if clinical presentation suggests symptomatic illness. Performed at [facility],  [address] [place], [place] [address]   Culture, blood (Routine X 2) w Reflex to ID Panel     Status: None   Collection Time: 01/17/21  6:59 AM   Specimen: BLOOD RIGHT HAND  Result Value Ref Range Status   Specimen Description BLOOD RIGHT HAND   Final   Special Requests   Final    BOTTLES DRAWN AEROBIC AND ANAEROBIC Blood Culture adequate volume   Culture   Final    NO GROWTH 5 DAYS Performed at [facility], [place] [address] [place], [place] [address]    Report Status 01/22/2021 FINAL  Final  Culture, blood (Routine X 2) w Reflex to ID Panel     Status: None   Collection Time: 01/17/21  6:59 AM   Specimen: BLOOD RIGHT WRIST  Result Value Ref Range Status   Specimen Description BLOOD RIGHT WRIST  Final   Special Requests   Final    BOTTLES DRAWN AEROBIC AND ANAEROBIC Blood Culture adequate volume   Culture   Final    NO GROWTH 5 DAYS Performed at [facility], [place] [address], [place], [place] [address]    Report Status 01/22/2021 FINAL  Final  Culture, blood (routine x 2)     Status: None (Preliminary result)   Collection Time: 01/19/21  4:46 AM   Specimen: BLOOD RIGHT HAND  Result Value Ref Range Status   Specimen Description BLOOD RIGHT HAND  Final   Special Requests   Final    BOTTLES DRAWN AEROBIC ONLY Blood Culture adequate volume   Culture   Final    NO GROWTH 4 DAYS Performed at [facility], [place] [address], [place], [place] [address]    Report Status PENDING  Incomplete  Culture, blood (routine x 2)     Status: None (Preliminary result)   Collection Time: 01/19/21  4:46 AM   Specimen: BLOOD RIGHT HAND  Result Value Ref Range Status   Specimen Description BLOOD RIGHT HAND  Final   Special Requests   Final    BOTTLES DRAWN AEROBIC ONLY Blood Culture adequate volume   Culture   Final    NO GROWTH 4 DAYS Performed at [facility], [place] [address] [place], [place] [address]    Report Status PENDING  Incomplete    Gaylan Gerold, [facility] for Infectious [place] [phone] pager   [phone] cell 01/23/2021, 1:39 PM

## 2021-01-23 NOTE — Progress Notes (Signed)
Subjective:  No acute events overnight.   Ryan Wells was evaluated at bedside with the assistance of a Spanish interpretor via telephone. He reports feeling well today with improvement in his left arm pain. He says that he is not able to move his arm much on his own, but range of motion is intact if someone is helping him. His back pain is under better control, he says that it is chronic. He notes some new difficulty swallowing pills and feels that his voice has changed. Discussed SLP consult, plan to work with PT, and plan for fistulagram tomorrow and he had no further questions.   Objective:  Vital signs in last 24 hours: Vitals:   01/22/21 2024 01/23/21 0438 01/23/21 0500 01/23/21 0828  BP: (!) 124/52 118/63  125/67  Pulse: 61 65  65  Resp: 16 17    Temp: 98.9 F (37.2 C) 98.7 F (37.1 C)    TempSrc: Oral Oral    SpO2: 100% 100%  100%  Weight:   69.8 kg   Height:       Weight change:   Intake/Output Summary (Last 24 hours) at 01/23/2021 1124 Last data filed at 01/23/2021 0500 Gross per 24 hour  Intake 470 ml  Output 2587 ml  Net -2117 ml   Labs:  CBC Latest Ref Rng & Units 01/23/2021 01/22/2021 01/21/2021  WBC 4.0 - 10.5 K/uL 10.9(H) 14.9(H) 16.6(H)  Hemoglobin 13.0 - 17.0 g/dL 9.3(L) 9.0(L) 8.8(L)  Hematocrit 39.0 - 52.0 % 29.3(L) 27.6(L) 26.5(L)  Platelets 150 - 400 K/uL 306 302 252    Physical Exam:  General: Chronically-ill appearing gentleman resting in bed in no acute distress HENT: Normocephalic, atraumatic. Hearing intact. Mucus membranes moist.  CV: Regular rate and rhythm with systolic murmur. BL hands cool to touch Pulm: Lungs CTAB. No wheezes or crackles. Breathing comfortably on 3.5L [place] Abd: Nontender, nondistended.  MSK: Left forearm remains hyperpigmented and slightly warm to touch, although less so than yesterday. Left forearm edema is somewhat improved. AVF is covered with clean, dry bandage. LUE active range of motion limited, passive range of  motion intact. Grip strength intact BL Neuro: Alert and grossly oriented. Speech and cognition grossly intact. No focal neurologic deficits Psych: Appropriate affect  Assessment/Plan:  Active Problems:   Intractable nausea and vomiting   Sepsis ([place])   Shock ([place])   MSSA bacteremia   ESRD needing dialysis ([place])   Gallstones   Nausea and vomiting in adult   Septic shock (HCC)   C. difficile diarrhea  Ryan Wells is a 65 year old Spanish-speaking gentleman with a history of htn, DM2, and ESRD on HD MWF who was admitted for septic shock and found tot have MSSA bacteremia with suspected source LUE AV fistula.   #MSSA Bacteremia #Septic shock, resolved Patient was transferred to ICU for septic shock requiring pressors with septic pulmonary emboli on CT. 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 Repeat blood cultures are negative at 4 and 5 days and he remains afebrile. His white count continues to trend down, 10.9 today from 14.9. He is more comfortable today with improvement in LUE pain and less swelling on exam, although forearm remains somewhat warm to touch and hyperpigmented. Will continue antibiotic regimen per ID and proceed with AV fistulagram scheduled for tomorrow.  - Appreciate ongoing ID involvement and recommendations - Continue cefazolin, he needs 6-week course per ID. Start: 6/7 - Appreciate vascular recommendations - Fistulagram scheduled for  6/15. NPO at midnight per vascular  #Dysphagia  Patient notes new difficulty swallowing pills and a change in his voice.  - SLP consulted for swallow evaluation  - Appreciate SLP recommendations   #C. Diff - Continue fidaxomicin Start: 6/7 End: 6/17  #ESRD on HD Appreciate nephrology managing inpatient HD. Last dialysis was 6/13  Code Status: Full Diet: renal/carb modified with fluid restriction Fluids: none VTE ppx: subq Heparin    LOS: 8 days   Theodosia Blender, Medical  Student 01/23/2021, 11:24 AM

## 2021-01-23 NOTE — Progress Notes (Signed)
[facility] Progress Note   Subjective: Seen in room. No C/Os. For F'gram 01/23/21  Objective Vitals:   01/22/21 2024 01/23/21 0438 01/23/21 0500 01/23/21 0828  BP: (!) 124/52 118/63  125/67  Pulse: 61 65  65  Resp: 16 17    Temp: 98.9 F (37.2 C) 98.7 F (37.1 C)    TempSrc: Oral Oral    SpO2: 100% 100%  100%  Weight:   69.8 kg   Height:       Physical Exam General: Chronically ill appearing male in NAD Heart: S1,S2 RRR No M/R/G Lungs: CTAB  Abdomen: S, NT Extremities: No LE edema Dialysis Access: L AVF +T/B   Additional Objective Labs: Basic Metabolic Panel: Recent Labs  Lab 01/20/21 0856 01/21/21 0132 01/22/21 0248  NA 133* 133* 130*  K 3.6 3.7 4.8  CL 96* 96* 93*  CO2 '23 25 22  '$ GLUCOSE 152* 85 148*  BUN 51* 60* 77*  CREATININE 7.87* 9.07* 11.22*  CALCIUM 7.9* 8.0* 7.9*  PHOS 3.5 4.5 5.4*   Liver Function Tests: Recent Labs  Lab 01/20/21 0856 01/21/21 0132 01/22/21 0248  ALBUMIN 2.3* 2.1* 2.0*   No results for input(s): LIPASE, AMYLASE in the last 168 hours. CBC: Recent Labs  Lab 01/19/21 0446 01/19/21 0446 01/20/21 0856 01/21/21 0132 01/22/21 0248 01/23/21 0358  WBC 14.0*  --  14.6* 16.6* 14.9* 10.9*  NEUTROABS  --    < > 11.3* 12.4* 11.5* 8.3*  HGB 8.9*  --  9.0* 8.8* 9.0* 9.3*  HCT 26.7*  --  27.6* 26.5* 27.6* 29.3*  MCV 95.0  --  96.8 96.7 96.8 98.7  PLT 136*  --  211 252 302 306   < > = values in this interval not displayed.   Blood Culture    Component Value Date/Time   SDES BLOOD RIGHT HAND 01/19/2021 0446   SDES BLOOD RIGHT HAND 01/19/2021 0446   SPECREQUEST  01/19/2021 0446    BOTTLES DRAWN AEROBIC ONLY Blood Culture adequate volume   SPECREQUEST  01/19/2021 0446    BOTTLES DRAWN AEROBIC ONLY Blood Culture adequate volume   CULT  01/19/2021 0446    NO GROWTH 4 DAYS Performed at [facility], [place] [address], [place], [place] [address]    CULT  01/19/2021 0446    NO GROWTH 4 DAYS Performed at [facility], [place] [address] [place], [place] [address]    REPTSTATUS PENDING 01/19/2021 0446   REPTSTATUS PENDING 01/19/2021 0446    Cardiac Enzymes: Recent Labs  Lab 01/21/21 0132  CKTOTAL 58   CBG: Recent Labs  Lab 01/22/21 1746 01/22/21 2036 01/23/21 0124 01/23/21 0337 01/23/21 0731  GLUCAP 140* 136* 76 102* 111*   Iron Studies: No results for input(s): IRON, TIBC, TRANSFERRIN, FERRITIN in the last 72 hours. '@lablastinr3'$ @ Studies/Results: No results found. Medications:  sodium chloride Stopped (01/17/21 0414)   sodium chloride Stopped (01/19/21 0013)    ceFAZolin (ANCEF) IV Stopped (01/22/21 1903)    acetaminophen  650 mg Oral Q6H   Chlorhexidine Gluconate Cloth  6 each Topical Q0600   Chlorhexidine Gluconate Cloth  6 each Topical Q0600   doxercalciferol  4 mcg Intravenous Q M,W,F-HD   fidaxomicin  200 mg Oral BID   heparin  2,200 Units Dialysis Once in dialysis   heparin injection (subcutaneous)  5,000 Units Subcutaneous Q8H   insulin aspart  3-9 Units Subcutaneous Q4H   midodrine  10 mg Oral TID WC   multivitamin  1 tablet Oral QHS   sucroferric oxyhydroxide  1,000 mg Oral TID WC     HD Orders: [place] MWF 3h 63mn   400/500   68kg   2/2 bath  P4  AVF   -Heparin  2200  units IV TIW -Hectorol 4 mcg IV TIW   Assessment/ Plan: Septic shock - due to MSSA bacteremia. Shock resolved. Unclear source of sepsis, no signs of AVF infection by exam. Seen by ID and currently on cefazolin. TEE 6/10 was negative for vegetation. Swelling LUE CT without evidence of abscess. F'gram being arranged. Will require 6-8 weeks ABX as OP on discharge.  Pain/swelling LUE-going for F'gram tomorrow per [facility].  ESRD: MWF HD. Next HD 01/24/21 on schedule.  BP/ volume - Net UF 2.5 06/13 Post wt 69.8 kg. Getting closer to OP EDW. BP improved after addition of midodrine.  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 Continue to monitor.  Transfuse prn. Not on ESA as  outpatient. Secondary hyperparathyroidism: PO4 at goal.  Continue binders, vitamin D.   Hyponatremia - resolved. Cdif diarrhea - getting Dificid per ID, diarrhea better.   Quan Cybulski H. Trinaty Bundrick NP-C 01/23/2021, 9:35 AM  [phone]

## 2021-01-23 NOTE — Plan of Care (Signed)
  Problem: Education: Goal: Knowledge of General Education information will improve Description: Including pain rating scale, medication(s)/side effects and non-pharmacologic comfort measures 01/23/2021 2358 by Breeze Berringer, Pedro Earls, RN Outcome: Progressing 01/23/2021 2358 by Kingdom Vanzanten, Pedro Earls, RN Outcome: Progressing   Problem: Health Behavior/Discharge Planning: Goal: Ability to manage health-related needs will improve 01/23/2021 2358 by Dona Walby, Pedro Earls, RN Outcome: Progressing 01/23/2021 2358 by [place] Leriche, RN Outcome: Progressing   Problem: Clinical Measurements: Goal: Ability to maintain clinical measurements within normal limits will improve 01/23/2021 2358 by Jamesetta Greenhalgh, Pedro Earls, RN Outcome: Progressing 01/23/2021 2358 by [place] Leriche, RN Outcome: Progressing Goal: Will remain free from infection 01/23/2021 2358 by Jayvien Rowlette, Pedro Earls, RN Outcome: Progressing 01/23/2021 2358 by [place] Leriche, RN Outcome: Progressing Goal: Diagnostic test results will improve 01/23/2021 2358 by Angelise Petrich, Pedro Earls, RN Outcome: Progressing 01/23/2021 2358 by [place] Leriche, RN Outcome: Progressing Goal: Respiratory complications will improve 01/23/2021 2358 by Kealie Barrie, Pedro Earls, RN Outcome: Progressing 01/23/2021 2358 by [place] Leriche, RN Outcome: Progressing Goal: Cardiovascular complication will be avoided 01/23/2021 2358 by Leslee Suire, Pedro Earls, RN Outcome: Progressing 01/23/2021 2358 by [place] Leriche, RN Outcome: Progressing   Problem: Activity: Goal: Risk for activity intolerance will decrease 01/23/2021 2358 by Daxton Nydam, Pedro Earls, RN Outcome: Progressing 01/23/2021 2358 by [place] Leriche, RN Outcome: Progressing   Problem: Nutrition: Goal: Adequate nutrition will be maintained 01/23/2021 2358 by Fidencio Duddy, Pedro Earls, RN Outcome:  Progressing 01/23/2021 2358 by [place] Leriche, RN Outcome: Progressing   Problem: Coping: Goal: Level of anxiety will decrease 01/23/2021 2358 by Kanijah Groseclose, Pedro Earls, RN Outcome: Progressing 01/23/2021 2358 by [place] Leriche, RN Outcome: Progressing   Problem: Elimination: Goal: Will not experience complications related to bowel motility 01/23/2021 2358 by Jordan Caraveo, Pedro Earls, RN Outcome: Progressing 01/23/2021 2358 by [place] Leriche, RN Outcome: Progressing Goal: Will not experience complications related to urinary retention 01/23/2021 2358 by Francie Keeling, Pedro Earls, RN Outcome: Progressing 01/23/2021 2358 by [place] Leriche, RN Outcome: Progressing   Problem: Pain Managment: Goal: General experience of comfort will improve 01/23/2021 2358 by Maree Ainley, Pedro Earls, RN Outcome: Progressing 01/23/2021 2358 by [place] Leriche, RN Outcome: Progressing   Problem: Safety: Goal: Ability to remain free from injury will improve Outcome: Progressing   Problem: Skin Integrity: Goal: Risk for impaired skin integrity will decrease Outcome: Progressing

## 2021-01-23 NOTE — Plan of Care (Signed)
  Problem: Activity: Goal: Risk for activity intolerance will decrease Outcome: Progressing  Alert and oriented X3, reports pain 4/10 at rest. Pt ambulated to the chair with a standard walker with one person assist. He torelated sitting on the chair while visiting with family.   Pt was also weaned off oxygen. HE sustained above 92% room air. Currently on continuous pulse ox.

## 2021-01-23 NOTE — Progress Notes (Signed)
Plan fistulagram tomorrow in cath lab. NPO after midnight. Orders for consent written.  Ryan Wells. Stanford Breed, MD [facility] Phone Number: [phone] 01/23/2021 8:22 AM

## 2021-01-24 ENCOUNTER — Encounter (HOSPITAL_COMMUNITY)
Admission: EM | Disposition: A | Discharge status: Home Health | Payer: Self-pay | Source: Home / Self Care | Attending: Student in an Organized Health Care Education/Training Program

## 2021-01-24 ENCOUNTER — Encounter (HOSPITAL_COMMUNITY): Payer: Self-pay | Admitting: Vascular Surgery

## 2021-01-24 DIAGNOSIS — N186 End stage renal disease: Secondary | ICD-10-CM

## 2021-01-24 DIAGNOSIS — M7989 Other specified soft tissue disorders: Secondary | ICD-10-CM

## 2021-01-24 DIAGNOSIS — R52 Pain, unspecified: Secondary | ICD-10-CM

## 2021-01-24 HISTORY — PX: A/V FISTULAGRAM: CATH118298

## 2021-01-24 LAB — CULTURE, BLOOD (ROUTINE X 2)
Culture: NO GROWTH
Culture: NO GROWTH
Special Requests: ADEQUATE
Special Requests: ADEQUATE

## 2021-01-24 LAB — RENAL FUNCTION PANEL
Albumin: 2.1 g/dL — ABNORMAL LOW (ref 3.5–5.0)
Anion gap: 10 (ref 5–15)
BUN: 20 mg/dL (ref 8–23)
CO2: 27 mmol/L (ref 22–32)
Calcium: 7.3 mg/dL — ABNORMAL LOW (ref 8.9–10.3)
Chloride: 95 mmol/L — ABNORMAL LOW (ref 98–111)
Creatinine, Ser: 3.85 mg/dL — ABNORMAL HIGH (ref 0.61–1.24)
GFR, Estimated: 16 mL/min — ABNORMAL LOW (ref >60)
Glucose, Bld: 104 mg/dL — ABNORMAL HIGH (ref 70–99)
Phosphorus: 3.1 mg/dL (ref 2.5–4.6)
Potassium: 3.3 mmol/L — ABNORMAL LOW (ref 3.5–5.1)
Sodium: 132 mmol/L — ABNORMAL LOW (ref 135–145)

## 2021-01-24 LAB — CBC WITH DIFFERENTIAL/PLATELET
Abs Immature Granulocytes: 0.24 10*3/uL — ABNORMAL HIGH (ref 0.00–0.07)
Basophils Absolute: 0.1 10*3/uL (ref 0.0–0.1)
Basophils Relative: 1 %
Eosinophils Absolute: 0.1 10*3/uL (ref 0.0–0.5)
Eosinophils Relative: 1 %
HCT: 27.3 % — ABNORMAL LOW (ref 39.0–52.0)
Hemoglobin: 8.7 g/dL — ABNORMAL LOW (ref 13.0–17.0)
Immature Granulocytes: 2 %
Lymphocytes Relative: 14 %
Lymphs Abs: 1.5 10*3/uL (ref 0.7–4.0)
MCH: 31.1 pg (ref 26.0–34.0)
MCHC: 31.9 g/dL (ref 30.0–36.0)
MCV: 97.5 fL (ref 80.0–100.0)
Monocytes Absolute: 0.7 10*3/uL (ref 0.1–1.0)
Monocytes Relative: 7 %
Neutro Abs: 8.2 10*3/uL — ABNORMAL HIGH (ref 1.7–7.7)
Neutrophils Relative %: 75 %
Platelets: 301 10*3/uL (ref 150–400)
RBC: 2.8 MIL/uL — ABNORMAL LOW (ref 4.22–5.81)
RDW: 13.8 % (ref 11.5–15.5)
WBC: 10.8 10*3/uL — ABNORMAL HIGH (ref 4.0–10.5)
nRBC: 0 % (ref 0.0–0.2)

## 2021-01-24 LAB — GLUCOSE, CAPILLARY
Glucose-Capillary: 102 mg/dL — ABNORMAL HIGH (ref 70–99)
Glucose-Capillary: 107 mg/dL — ABNORMAL HIGH (ref 70–99)
Glucose-Capillary: 108 mg/dL — ABNORMAL HIGH (ref 70–99)
Glucose-Capillary: 109 mg/dL — ABNORMAL HIGH (ref 70–99)
Glucose-Capillary: 114 mg/dL — ABNORMAL HIGH (ref 70–99)
Glucose-Capillary: 123 mg/dL — ABNORMAL HIGH (ref 70–99)
Glucose-Capillary: 131 mg/dL — ABNORMAL HIGH (ref 70–99)

## 2021-01-24 LAB — CBC
HCT: 27.5 % — ABNORMAL LOW (ref 39.0–52.0)
Hemoglobin: 8.6 g/dL — ABNORMAL LOW (ref 13.0–17.0)
MCH: 31 pg (ref 26.0–34.0)
MCHC: 31.3 g/dL (ref 30.0–36.0)
MCV: 99.3 fL (ref 80.0–100.0)
Platelets: 344 10*3/uL (ref 150–400)
RBC: 2.77 MIL/uL — ABNORMAL LOW (ref 4.22–5.81)
RDW: 13.8 % (ref 11.5–15.5)
WBC: 11 10*3/uL — ABNORMAL HIGH (ref 4.0–10.5)
nRBC: 0 % (ref 0.0–0.2)

## 2021-01-24 SURGERY — A/V FISTULAGRAM
Anesthesia: LOCAL

## 2021-01-24 MED ORDER — HEPARIN SODIUM (PORCINE) 1000 UNIT/ML DIALYSIS: 2200.0000 [IU] | Freq: Once | INTRAMUSCULAR | Status: DC

## 2021-01-24 MED ORDER — HEPARIN (PORCINE) IN NACL 1000-0.9 UT/500ML-% IV SOLN
INTRAVENOUS | Status: AC
  Filled 2021-01-24: qty 500

## 2021-01-24 MED ORDER — SODIUM CHLORIDE 0.9 % IV SOLN: 100.0000 mL | INTRAVENOUS | Status: DC | PRN

## 2021-01-24 MED ORDER — HEPARIN (PORCINE) IN NACL 1000-0.9 UT/500ML-% IV SOLN
INTRAVENOUS | Status: DC | PRN
  Administered 2021-01-24: 500 mL

## 2021-01-24 MED ORDER — LIDOCAINE 5 % EX PTCH
1.0000 | MEDICATED_PATCH | CUTANEOUS | Status: DC
  Administered 2021-01-25 – 2021-01-30 (×6): 1 via TRANSDERMAL
  Filled 2021-01-24 (×6): qty 1

## 2021-01-24 MED ORDER — DOXERCALCIFEROL 4 MCG/2ML IV SOLN
INTRAVENOUS | Status: AC
  Filled 2021-01-24: qty 2

## 2021-01-24 MED ORDER — LIDOCAINE HCL (PF) 1 % IJ SOLN
INTRAMUSCULAR | Status: AC
  Filled 2021-01-24: qty 30

## 2021-01-24 MED ORDER — LIDOCAINE HCL (PF) 1 % IJ SOLN
INTRAMUSCULAR | Status: DC | PRN
  Administered 2021-01-24: 10 mL

## 2021-01-24 MED ORDER — MIDODRINE HCL 5 MG PO TABS
ORAL_TABLET | ORAL | Status: AC
  Administered 2021-01-24: 10 mg via ORAL
  Filled 2021-01-24: qty 2

## 2021-01-24 SURGICAL SUPPLY — 9 items
BAG SNAP BAND KOVER 36X36 (MISCELLANEOUS) ×2 IMPLANT
COVER DOME SNAP 22 D (MISCELLANEOUS) ×2 IMPLANT
KIT MICROPUNCTURE NIT STIFF (SHEATH) ×1 IMPLANT
PROTECTION STATION PRESSURIZED (MISCELLANEOUS) ×2
SHEATH PROBE COVER 6X72 (BAG) IMPLANT
STATION PROTECTION PRESSURIZED (MISCELLANEOUS) ×1 IMPLANT
STOPCOCK MORSE 400PSI 3WAY (MISCELLANEOUS) ×2 IMPLANT
TRAY PV CATH (CUSTOM PROCEDURE TRAY) ×2 IMPLANT
TUBING CIL FLEX 10 FLL-RA (TUBING) ×2 IMPLANT

## 2021-01-24 NOTE — Op Note (Signed)
DATE OF SERVICE: 01/24/2021  PATIENT:  Ryan Wells  65 y.o. male  PRE-OPERATIVE DIAGNOSIS:  ESRD with left arm swelling  POST-OPERATIVE DIAGNOSIS:  Same  PROCEDURE:   left upper extremity fistulagram  SURGEON:  Surgeon(s) and Role:    * Cherre Robins, MD - Primary  ASSISTANT: none  ANESTHESIA:   local  EBL: min  BLOOD ADMINISTERED:none  DRAINS: none   LOCAL MEDICATIONS USED:  LIDOCAINE   SPECIMEN:  none  COUNTS: confirmed correct.  TOURNIQUET:  none  PATIENT DISPOSITION:  PACU - hemodynamically stable.   Delay start of Pharmacological VTE agent (>24hrs) due to surgical blood loss or risk of bleeding: no  INDICATION FOR PROCEDURE: Ryan Wells is a 65 y.o. male with ESRD dialyzing via left upper extremity brachiobasilic arteriovenous fistula. The patient had left arm swelling. After careful discussion of risks, benefits, and alternatives the patient was offered fistulagram. The patient understood and wished to proceed.  OPERATIVE FINDINGS: no flow limiting stenosis in left upper extremity central veins, previously placed basilic vein stent, or in inflow to fistula. Normal fistulagram. No source for swelling identified.  DESCRIPTION OF PROCEDURE: After identification of the patient in the pre-operative holding area, the patient was transferred to the operating room. The patient was positioned supine on the operating room table. Anesthesia was induced. The left arm was prepped and draped in standard fashion. A surgical pause was performed confirming correct patient, procedure, and operative location.  The fistula was accessed with micropuncture technique.  Over the wire, a micro sheath was introduced into the fistula.  Fistulogram was performed in stations starting in the central veins.  No flow-limiting stenosis was identified in the left innominate vein, subclavian vein, axillary vein, basilic vein.  A previously placed stent was noted mid arm.  There is no  stenosis associated with the stent.  The fistula was occluded and a reflux angiogram performed of the left upper extremity.  This revealed no inflow stenosis.  The access site was closed with a figure-of-eight Monocryl suture.  Upon completion of the case instrument and sharps counts were confirmed correct. The patient was transferred to the PACU in good condition. I was present for all portions of the procedure.  Yevonne Aline. Stanford Breed, MD [facility] Phone Number: [phone] 01/24/2021 1:16 PM

## 2021-01-24 NOTE — Evaluation (Signed)
Physical Therapy Evaluation Patient Details Name: Ryan Wells MRN: [id number] DOB: 1956/03/17 Today's Date: 01/24/2021   History of Present Illness  65 yo male wiht onset of N&V was admitted, had diarrhea and had to start in ICU.  Pt was dehydrated, had low Na, in lactic acidosis with fever, hypotensive.  PNA found, had MSSA bacteremia from unknown cause with septic shock.  TEE cleared endocarditis as source, Fistula cleared on 6/15 of occlusion.  PMHx:  ESRD, HTN, DM,  Clinical Impression  Pt is up to walk with PT and did not have access to BP to check once walking.  Pt sat and was able to return to bed, but was motivated to work and tends not to complain even when translator prompted for PT.  Follow acutely and pt is expecting to go to rehab setting when he is discharged.  Follow for acute PT goals.      Follow Up Recommendations SNF    Equipment Recommendations  None recommended by PT    Recommendations for Other Services       Precautions / Restrictions Precautions Precautions: Fall Precaution Comments: monitor vitals, light headed with mobility Restrictions Weight Bearing Restrictions: No      Mobility  Bed Mobility Overal bed mobility: Needs Assistance Bed Mobility: Rolling;Sidelying to Sit;Sit to Sidelying Rolling: Min assist Sidelying to sit: Mod assist     Sit to sidelying: Mod assist General bed mobility comments: mod assist to sit up and mod to get legs onto bed    Transfers Overall transfer level: Needs assistance Equipment used: Rolling walker (2 wheeled);1 person hand held assist Transfers: Sit to/from Stand Sit to Stand: Min assist         General transfer comment: min assist to power up and control balance  Ambulation/Gait Ambulation/Gait assistance: Min assist Gait Distance (Feet): 24 Feet (12 x 2) Assistive device: Rolling walker (2 wheeled);1 person hand held assist Gait Pattern/deviations: Step-through pattern;Step-to pattern;Decreased  stride length;Trunk flexed Gait velocity: reduced Gait velocity interpretation: <1.31 ft/sec, indicative of household ambulator General Gait Details: pt was weak and slowly paced, kept a chair underneath him as he looked as if he might sit unexpectedly  Science writer    Modified Rankin (Stroke Patients Only)       Balance Overall balance assessment: History of Falls (in last few months)                                           Pertinent Vitals/Pain Pain Assessment: No/denies pain    Home Living Family/patient expects to be discharged to:: Private residence Living Arrangements: Spouse/significant other;Children Available Help at Discharge: Family;Available 24 hours/day Type of Home: House Home Access: Stairs to enter Entrance Stairs-Rails: Chemical engineer of Steps: 2 Home Layout: Two level Home Equipment: None      Prior Function Level of Independence: Independent         Comments: Pt was walking without AD     Hand Dominance   Dominant Hand: Right    Extremity/Trunk Assessment   Upper Extremity Assessment Upper Extremity Assessment: LUE deficits/detail LUE Deficits / Details: edema and going for procedure today LUE Coordination: decreased gross motor    Lower Extremity Assessment Lower Extremity Assessment: Generalized weakness    Cervical / Trunk Assessment Cervical / Trunk Assessment: Normal  Communication   Communication: Interpreter utilized;Prefers language other than English  Cognition Arousal/Alertness: Awake/alert Behavior During Therapy: WFL for tasks assessed/performed Overall Cognitive Status: Within Functional Limits for tasks assessed                                 General Comments: pt is able to speak a few words of English but only basic      General Comments General comments (skin integrity, edema, etc.): pt is weak to stand and a bit light headed,  on 3L O2 and complaining of fatigue    Exercises     Assessment/Plan    PT Assessment Patient needs continued PT services  PT Problem List Decreased strength;Decreased range of motion;Decreased activity tolerance;Decreased balance;Decreased mobility;Decreased knowledge of use of DME;Cardiopulmonary status limiting activity       PT Treatment Interventions DME instruction;Gait training;Stair training;Functional mobility training;Therapeutic activities;Therapeutic exercise;Balance training;Neuromuscular re-education;Patient/family education    PT Goals (Current goals can be found in the Care Plan section)  Acute Rehab PT Goals Patient Stated Goal: to get home after rehab via translation PT Goal Formulation: With patient Time For Goal Achievement: 02/07/21 Potential to Achieve Goals: Good    Frequency Min 3X/week   Barriers to discharge Inaccessible home environment has 20 steps to bedroom but thinks he can stay on one level    Co-evaluation               AM-PAC PT "6 Clicks" Mobility  Outcome Measure Help needed turning from your back to your side while in a flat bed without using bedrails?: A Little Help needed moving from lying on your back to sitting on the side of a flat bed without using bedrails?: A Lot Help needed moving to and from a bed to a chair (including a wheelchair)?: A Lot Help needed standing up from a chair using your arms (e.g., wheelchair or bedside chair)?: A Little Help needed to walk in hospital room?: A Little Help needed climbing 3-5 steps with a railing? : Total 6 Click Score: 14    End of Session Equipment Utilized During Treatment: Oxygen Activity Tolerance: Patient tolerated treatment well;Patient limited by fatigue;Treatment limited secondary to medical complications (Comment) Patient left: in bed;with call bell/phone within reach;with bed alarm set Nurse Communication: Mobility status PT Visit Diagnosis: Unsteadiness on feet  (R26.81);Muscle weakness (generalized) (M62.81);Difficulty in walking, not elsewhere classified (R26.2)    Time: [id number] PT Time Calculation (min) (ACUTE ONLY): 40 min   Charges:   PT Evaluation $PT Eval Moderate Complexity: 1 Mod PT Treatments $Gait Training: 8-22 mins $Therapeutic Exercise: 8-22 mins       Ramond Dial 01/24/2021, 1:57 PM  Mee Hives, PT MS Acute Rehab Dept. Number: [place] and [place]

## 2021-01-24 NOTE — Progress Notes (Signed)
Subjective: No acute events overnight.   Ryan Wells was evaluated at bedside with the assistance of a Spanish interpretor via telephone. This morning he reports resting uncomfortably due to his chronic low back pain. He had not yet received prn oxycodone that morning. His LUE is comfortable at rest, but he endorses pain around his AV fistula when he raises the left arm. Discussed plans for AV fistulagram this morning and PT evaluation of LUE.   Objective:  Vital signs in last 24 hours: Vitals:   01/23/21 1409 01/23/21 2011 01/24/21 0427 01/24/21 0947  BP: (!) 133/58 (!) 131/57 (!) 135/57 131/62  Pulse: 75 65 66 69  Resp: '18 17 16 16  '$ Temp: 99.2 F (37.3 C) 98.1 F (36.7 C) 98.6 F (37 C) 98.7 F (37.1 C)  TempSrc: Oral Oral Oral Oral  SpO2:  100% 100% 99%  Weight:      Height:       Weight change:   Intake/Output Summary (Last 24 hours) at 01/24/2021 1143 Last data filed at 01/24/2021 0433 Gross per 24 hour  Intake 300 ml  Output 50 ml  Net 250 ml   Labs:  CBC Latest Ref Rng & Units 01/24/2021 01/23/2021 01/22/2021  WBC 4.0 - 10.5 K/uL 10.8(H) 10.9(H) 14.9(H)  Hemoglobin 13.0 - 17.0 g/dL 8.7(L) 9.3(L) 9.0(L)  Hematocrit 39.0 - 52.0 % 27.3(L) 29.3(L) 27.6(L)  Platelets 150 - 400 K/uL 301 306 302    Physical Exam: General: Chronically ill appearing gentleman resting uncomfortably in bed in no acute distress HENT: Normocephalic, atraumatic. Mucus membranes moist. Hearing intact CV: Regular rate and rhythm with systolic murmur Pulm: Lungs CTAB. No wheezes or crackles. Breathing comfortably on room air Abd: Nontender, nondistended. Normoactive bowel sounds MSK: No LUE edema. LUE active ROM intact but limited by pain. AVF covered by clean, intact bandages Derm: L forearm hyperpigmented  Neuro: Alert and grossly oriented. No focal neurologic deficits Psych: Appropriate affect   Assessment/Plan:  Active Problems:   Intractable nausea and vomiting   Sepsis ([place])    Shock ([place])   MSSA bacteremia   ESRD needing dialysis ([place])   Gallstones   Nausea and vomiting in adult   Septic shock (HCC)   C. difficile diarrhea  Ryan Wells is a 65 year old Spanish-speaking gentleman with a history of htn, DM2, and ESRD on HD who was admitted for septic shock and found to have MSSA bacteremia with suspected source LUE AV fistula.   #MSSA Bacteremia #Septic shock, resolved Hospital course was complicated by ICU stay for septic shock requiring pressors with septic pulmonary emboli on CT chest, being treated as infective endocarditis although negative TEE. He remains afebrile, hemodynamically stable, and with white count trending down.  - Continue IV Ancef per ID. Now receiving MWF with HD - Final result: No growth at 5 days on repeat blood cultures from 6/8 and 6/10  - Continue to trend CBC  #LUE pain  No evidence of LUE infection per ID. His LUE active range of motion is improved, although movement causes pain around his AV fistula. The fistulagram today was normal with no signs of outflow stenosis to explain his LUE pain. At this point, will continue with pain management.  - Appreciate vascular surgery involvement - AV fistulagram was negative for outflow stenosis. No further vascular intervention necessary - Awaiting PT/OT eval - Continue acetaminophen and oxycodone '5mg'$  q6hrs prn pain  - Lidocaine patch for chronic back pain   #ESRD on HD Appreciate nephrology managing  inpatient dialysis.  - Due for HD today 6/13  #C. Diff Diarrhea has resolved.  - Fidaxomicin was discontinued per ID.   Code status: FULL Diet: renal/carb modified with fluid restriction  Fluids: none VTE ppx: subq Heparin    LOS: 9 days   Theodosia Blender, Medical Student 01/24/2021, 11:43 AM

## 2021-01-24 NOTE — Progress Notes (Addendum)
   01/24/21 1115  Clinical Encounter Type  Visited With Patient  Visit Type Initial;Spiritual support  Referral From Nurse  Consult/Referral To Chaplain  Spiritual Encounters  Spiritual Needs Prayer;Sacred text  Stress Factors  Patient Stress Factors Health changes   Chaplain responded to consult for spiritual care. Chaplain spoke with Pt with a video translator [place], [phone]). Pt shared he was hungry and waiting for a procedure. It has been a difficult stay so far. Chaplain prayed Psalm 34 with Pt and will return with a Spanish Bible. Pt also requested priest so [place] will reach out to a local parish. Chaplain remains available.   [place] left message with Father Barnabas Lister about visiting Pt.  1340 - Chaplain left a Spanish Bible at Pt's bedside.   This note was prepared by Chaplain Resident, Dante Gang, MDiv. Chaplain remains available as needed through the on-call pager: [phone].

## 2021-01-24 NOTE — Progress Notes (Signed)
[facility] Progress Note   Subjective: Going for f'gram today. HD today on schedule.   Objective Vitals:   01/23/21 1409 01/23/21 2011 01/24/21 0427 01/24/21 0947  BP: (!) 133/58 (!) 131/57 (!) 135/57 131/62  Pulse: 75 65 66 69  Resp: '18 17 16 16  '$ Temp: 99.2 F (37.3 C) 98.1 F (36.7 C) 98.6 F (37 C) 98.7 F (37.1 C)  TempSrc: Oral Oral Oral Oral  SpO2:  100% 100% 99%  Weight:      Height:       Physical Exam General: Chronically ill appearing male in NAD Heart: S1,S2 RRR No M/R/G Lungs: CTAB  Abdomen: S, NT Extremities: No LE edema Dialysis Access: L AVF +T/B   Additional Objective Labs: Basic Metabolic Panel: Recent Labs  Lab 01/20/21 0856 01/21/21 0132 01/22/21 0248  NA 133* 133* 130*  K 3.6 3.7 4.8  CL 96* 96* 93*  CO2 '23 25 22  '$ GLUCOSE 152* 85 148*  BUN 51* 60* 77*  CREATININE 7.87* 9.07* 11.22*  CALCIUM 7.9* 8.0* 7.9*  PHOS 3.5 4.5 5.4*   Liver Function Tests: Recent Labs  Lab 01/20/21 0856 01/21/21 0132 01/22/21 0248  ALBUMIN 2.3* 2.1* 2.0*   No results for input(s): LIPASE, AMYLASE in the last 168 hours. CBC: Recent Labs  Lab 01/20/21 0856 01/21/21 0132 01/22/21 0248 01/23/21 0358 01/24/21 0123  WBC 14.6* 16.6* 14.9* 10.9* 10.8*  NEUTROABS 11.3* 12.4* 11.5* 8.3* 8.2*  HGB 9.0* 8.8* 9.0* 9.3* 8.7*  HCT 27.6* 26.5* 27.6* 29.3* 27.3*  MCV 96.8 96.7 96.8 98.7 97.5  PLT 211 252 302 306 301   Blood Culture    Component Value Date/Time   SDES BLOOD RIGHT HAND 01/19/2021 0446   SDES BLOOD RIGHT HAND 01/19/2021 0446   SPECREQUEST  01/19/2021 0446    BOTTLES DRAWN AEROBIC ONLY Blood Culture adequate volume   SPECREQUEST  01/19/2021 0446    BOTTLES DRAWN AEROBIC ONLY Blood Culture adequate volume   CULT  01/19/2021 0446    NO GROWTH 5 DAYS Performed at [facility], [place] [address], [place], [place] [address]    CULT  01/19/2021 0446    NO GROWTH 5 DAYS Performed at [facility], [place] [address]  [place], [place] [address]    REPTSTATUS 01/24/2021 FINAL 01/19/2021 0446   REPTSTATUS 01/24/2021 FINAL 01/19/2021 0446    Cardiac Enzymes: Recent Labs  Lab 01/21/21 0132  CKTOTAL 58   CBG: Recent Labs  Lab 01/23/21 1609 01/23/21 2003 01/24/21 0024 01/24/21 0424 01/24/21 0730  GLUCAP 147* 160* 107* 108* 114*   Iron Studies: No results for input(s): IRON, TIBC, TRANSFERRIN, FERRITIN in the last 72 hours. '@lablastinr3'$ @ Studies/Results: No results found. Medications:  sodium chloride Stopped (01/17/21 0414)   sodium chloride Stopped (01/19/21 0013)    ceFAZolin (ANCEF) IV      acetaminophen  650 mg Oral Q6H   Chlorhexidine Gluconate Cloth  6 each Topical Q0600   Chlorhexidine Gluconate Cloth  6 each Topical Q0600   doxercalciferol  4 mcg Intravenous Q M,W,F-HD   heparin  2,200 Units Dialysis Once in dialysis   heparin injection (subcutaneous)  5,000 Units Subcutaneous Q8H   insulin aspart  3-9 Units Subcutaneous Q4H   midodrine  10 mg Oral TID WC   multivitamin  1 tablet Oral QHS   sucroferric oxyhydroxide  1,000 mg Oral TID WC     HD Orders: [place] MWF 3h 3mn   400/500   68kg  2/2 bath  P4  AVF   -Heparin  2200  units IV TIW -Hectorol 4 mcg IV TIW   Assessment/ Plan: Septic shock - due to MSSA bacteremia. Shock resolved. Unclear source of sepsis, no signs of AVF infection by exam. Seen by ID and currently on cefazolin. TEE 6/10 was negative for vegetation. Swelling LUE CT without evidence of abscess. F'gram being arranged. Will require 6-8 weeks ABX as OP on discharge.  Pain/swelling LUE-going for F'gram 01/24/21 per [facility].  ESRD: MWF HD. Next HD 01/24/21 on schedule.  BP/ volume - BP controlled. Net UF 2.5 06/13 Post wt 69.8 kg. Getting closer to OP EDW. BP improved after addition of midodrine. UF as tolerated.  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 Start ESA with HD today.  Transfuse prn. Not on ESA as outpatient. Secondary hyperparathyroidism: PO4 at  goal.  Continue binders, vitamin D.   Hyponatremia - resolved. Cdif diarrhea - getting Dificid per ID, diarrhea better Dysphagia-new problem. Per primary.   Julen Rubert H. Byrl Latin NP-C 01/24/2021, 10:18 AM  [employer] [phone]    ]

## 2021-01-24 NOTE — Progress Notes (Signed)
SLP Cancellation Note  Patient Details Name: Ryan Wells MRN: [id number] DOB: [birth year]   Cancelled treatment:       Reason Eval/Treat Not Completed: Patient at procedure or test/unavailable. Pt NPO for procedure. Will f/u    Loie Jahr, Katherene Ponto 01/24/2021, 8:53 AM

## 2021-01-24 NOTE — Evaluation (Signed)
Occupational Therapy Evaluation Patient Details Name: Ryan Wells MRN: [id number] DOB: 1956/04/30 Today's Date: 01/24/2021    History of Present Illness 65 yo male wiht onset of N&V was admitted, had diarrhea and had to start in ICU.  Pt was dehydrated, had low Na, in lactic acidosis with fever, hypotensive.  PNA found, had MSSA bacteremia from unknown cause with septic shock.  TEE cleared endocarditis as source, Fistula cleared on 6/15 of occlusion.  PMHx:  ESRD, HTN, DM,   Clinical Impression   Pt admitted to ED for concerns listed above. PTA pt reported that he was independent with all ADL's and IADL's, using no DME for ambulation. At this time, pt is very limited by pain in his R hip and LUE. Pt requiring min to mod A for all ADL's and functional mobility. Pt will benefit from further OT to address concerns listed below. Acute OT to follow.   Spero Geralds, On-sight Spanish interpretor was used for this session.     Follow Up Recommendations  SNF;Supervision/Assistance - 24 hour    Equipment Recommendations  Tub/shower bench;3 in 1 bedside commode (2 wheeled RW)    Recommendations for Other Services       Precautions / Restrictions Precautions Precautions: Fall Precaution Comments: monitor vitals, light headed with mobility Restrictions Weight Bearing Restrictions: No      Mobility Bed Mobility Overal bed mobility: Needs Assistance Bed Mobility: Rolling;Sidelying to Sit;Sit to Sidelying Rolling: Min assist Sidelying to sit: Mod assist     Sit to sidelying: Mod assist General bed mobility comments: mod assist to sit up and mod to get legs onto bed    Transfers Overall transfer level: Needs assistance Equipment used: Rolling walker (2 wheeled) Transfers: Sit to/from Stand Sit to Stand: Mod assist;Min assist         General transfer comment: Mod A to power up with first transfer, once bed elevated, min A to power up and steady.    Balance Overall balance  assessment: Needs assistance;History of Falls Sitting-balance support: Bilateral upper extremity supported;Feet supported Sitting balance-Leahy Scale: Fair     Standing balance support: Bilateral upper extremity supported Standing balance-Leahy Scale: Fair                             ADL either performed or assessed with clinical judgement   ADL Overall ADL's : Needs assistance/impaired Eating/Feeding: NPO   Grooming: Wash/dry hands;Wash/dry face;Set up;Sitting   Upper Body Bathing: Minimal assistance;Sitting   Lower Body Bathing: Moderate assistance;Sitting/lateral leans;Sit to/from stand   Upper Body Dressing : Min guard;Sitting   Lower Body Dressing: Moderate assistance;Sitting/lateral leans;Sit to/from stand   Toilet Transfer: Moderate assistance;Stand-pivot   Toileting- Clothing Manipulation and Hygiene: Minimal assistance;Moderate assistance;Sit to/from stand;Sitting/lateral lean   Tub/ Shower Transfer: Moderate assistance;Stand-pivot   Functional mobility during ADLs: Minimal assistance;Moderate assistance;Rolling walker General ADL Comments: Pt requiring min-mod assist for most ADL's due to pain limiting performance in LB by R hip and stiffness in LUE limiting UB ADL's. Additionally, pt is very ooff balance and requiring assist once OOB.     Vision Baseline Vision/History: Wears glasses Wears Glasses: Reading only Patient Visual Report: No change from baseline Vision Assessment?: No apparent visual deficits     Perception Perception Perception Tested?: No   Praxis Praxis Praxis tested?: Not tested    Pertinent Vitals/Pain Pain Assessment: 0-10 Pain Score: 8  Pain Location: R hip Pain Descriptors / Indicators: Aching;Discomfort;Grimacing;Moaning Pain Intervention(s):  Monitored during session;Limited activity within patient's tolerance;Repositioned     Hand Dominance Right   Extremity/Trunk Assessment Upper Extremity Assessment Upper  Extremity Assessment: LUE deficits/detail;Generalized weakness LUE Deficits / Details: Unable to complete shoulder flexion past 90 degrees LUE Coordination: decreased gross motor   Lower Extremity Assessment Lower Extremity Assessment: Defer to PT evaluation   Cervical / Trunk Assessment Cervical / Trunk Assessment: Normal   Communication Communication Communication: Interpreter utilized;Prefers language other than English   Cognition Arousal/Alertness: Awake/alert Behavior During Therapy: WFL for tasks assessed/performed Overall Cognitive Status: Within Functional Limits for tasks assessed                                 General Comments: pt is able to speak a few words of English but only basic   General Comments  Pt complaining of lightheadedness when seated, however reports he is not dizzy in standing.    Exercises Exercises: Other exercises (4 to 4+ generally)   Shoulder Instructions      Home Living Family/patient expects to be discharged to:: Private residence Living Arrangements: Spouse/significant other;Children Available Help at Discharge: Family;Available 24 hours/day Type of Home: House Home Access: Stairs to enter [employer] of Steps: 2 Entrance Stairs-Rails: Left;Right Home Layout: Two level Alternate Level Stairs-Number of Steps: 20 Alternate Level Stairs-Rails: Left;Right Bathroom Shower/Tub: Occupational psychologist: Standard Bathroom Accessibility: No   Home Equipment: None          Prior Functioning/Environment Level of Independence: Independent        Comments: Pt was walking without AD        OT Problem List: Decreased strength;Decreased range of motion;Decreased activity tolerance;Impaired balance (sitting and/or standing);Decreased safety awareness;Decreased knowledge of use of DME or AE;Pain      OT Treatment/Interventions: Self-care/ADL training;Therapeutic exercise;Energy conservation;DME and/or  AE instruction;Therapeutic activities;Patient/family education;Balance training    OT Goals(Current goals can be found in the care plan section) Acute Rehab OT Goals Patient Stated Goal: To reduce pain OT Goal Formulation: With patient Time For Goal Achievement: 02/07/21 Potential to Achieve Goals: Good ADL Goals Pt Will Perform Lower Body Dressing: with supervision;with adaptive equipment;sitting/lateral leans;sit to/from stand Pt Will Transfer to Toilet: with modified independence;ambulating Pt Will Perform Toileting - Clothing Manipulation and hygiene: with modified independence;with adaptive equipment;sitting/lateral leans;sit to/from stand Additional ADL Goal #1: Pt will tolerate standing for 3 grooming tasks at the sink  OT Frequency: Min 2X/week   Barriers to D/C:            Co-evaluation              AM-PAC OT "6 Clicks" Daily Activity     Outcome Measure Help from another person eating meals?: A Little Help from another person taking care of personal grooming?: A Little Help from another person toileting, which includes using toliet, bedpan, or urinal?: A Lot Help from another person bathing (including washing, rinsing, drying)?: A Lot Help from another person to put on and taking off regular upper body clothing?: A Little Help from another person to put on and taking off regular lower body clothing?: A Lot 6 Click Score: 15   End of Session Equipment Utilized During Treatment: Rolling walker;Oxygen Nurse Communication: Mobility status  Activity Tolerance: Patient tolerated treatment well;Patient limited by pain Patient left: in bed;with call bell/phone within reach;with bed alarm set  OT Visit Diagnosis: Unsteadiness on feet (R26.81);Other abnormalities of gait and  mobility (R26.89);Muscle weakness (generalized) (M62.81)                Time: 1020-1051 OT Time Calculation (min): 31 min Charges:  OT General Charges $OT Visit: 1 Visit OT Evaluation $OT Eval  Moderate Complexity: 1 Mod OT Treatments $Self Care/Home Management : 8-22 mins  Jonise Weightman H., OTR/L Acute Rehabilitation  Avanelle Pixley Elane Gabby Rackers 01/24/2021, 2:58 PM

## 2021-01-25 DIAGNOSIS — I330 Acute and subacute infective endocarditis: Secondary | ICD-10-CM | POA: Diagnosis present

## 2021-01-25 LAB — CBC
HCT: 26.4 % — ABNORMAL LOW (ref 39.0–52.0)
Hemoglobin: 8.7 g/dL — ABNORMAL LOW (ref 13.0–17.0)
MCH: 31.9 pg (ref 26.0–34.0)
MCHC: 33 g/dL (ref 30.0–36.0)
MCV: 96.7 fL (ref 80.0–100.0)
Platelets: 303 10*3/uL (ref 150–400)
RBC: 2.73 MIL/uL — ABNORMAL LOW (ref 4.22–5.81)
RDW: 13.8 % (ref 11.5–15.5)
WBC: 9.6 10*3/uL (ref 4.0–10.5)
nRBC: 0 % (ref 0.0–0.2)

## 2021-01-25 LAB — GLUCOSE, CAPILLARY
Glucose-Capillary: 108 mg/dL — ABNORMAL HIGH (ref 70–99)
Glucose-Capillary: 111 mg/dL — ABNORMAL HIGH (ref 70–99)
Glucose-Capillary: 139 mg/dL — ABNORMAL HIGH (ref 70–99)
Glucose-Capillary: 141 mg/dL — ABNORMAL HIGH (ref 70–99)
Glucose-Capillary: 145 mg/dL — ABNORMAL HIGH (ref 70–99)
Glucose-Capillary: 82 mg/dL (ref 70–99)

## 2021-01-25 MED ORDER — DARBEPOETIN ALFA 60 MCG/0.3ML IJ SOSY
60.0000 ug | PREFILLED_SYRINGE | INTRAMUSCULAR | Status: DC
  Filled 2021-01-25: qty 0.3

## 2021-01-25 MED ORDER — PANTOPRAZOLE SODIUM 20 MG PO TBEC
20.0000 mg | DELAYED_RELEASE_TABLET | Freq: Once | ORAL | Status: AC
  Administered 2021-01-25: 20 mg via ORAL
  Filled 2021-01-25: qty 1

## 2021-01-25 NOTE — NC FL2 (Signed)
[place] LEVEL OF CARE SCREENING TOOL     IDENTIFICATION  Patient Name: Ryan Wells Birthdate: 1956-05-08 Sex: male Admission Date (Current Location): 01/15/2021  [facility] and [place] Number:  Herbalist and Address:  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   Long term use of drug 07/29/2016   Joint pain 07/12/2016   Hypothyroidism 07/12/2014   Diabetes type 2, uncontrolled ([place]) 11/25/2011    Orientation RESPIRATION BLADDER Height & Weight     Self, Time, Situation, Place  Normal Continent Weight: 147 lb 11.3 oz (67 kg) Height:  '5\' 2"'$  (157.5 cm)  BEHAVIORAL SYMPTOMS/MOOD NEUROLOGICAL BOWEL NUTRITION STATUS      Continent Diet  AMBULATORY STATUS COMMUNICATION OF NEEDS Skin   Limited Assist Verbally Normal                       Personal Care Assistance Level of Assistance  Bathing, Feeding, Dressing Bathing Assistance: Limited assistance Feeding  assistance: Independent Dressing Assistance: Limited assistance     Functional Limitations Info  Sight, Hearing, Speech Sight Info: Adequate Hearing Info: Adequate Speech Info: Adequate    SPECIAL CARE FACTORS FREQUENCY  PT (By licensed PT), OT (By licensed OT)     PT Frequency: 5x a week OT Frequency: 5x a week            Contractures Contractures Info: Not present    Additional Factors Info  Code Status, Allergies Code Status Info: Full Allergies Info: NKA           Current Medications (01/25/2021):  This is the current hospital active medication list Current Facility-Administered Medications  Medication Dose Route Frequency Provider Last Rate Last Admin   0.9 %  sodium chloride infusion  250 mL Intravenous Continuous Jerline Pain, MD   Stopped at 01/17/21 0414   0.9 %  sodium chloride infusion   Intravenous Continuous Jerline Pain, MD   Stopped at 01/19/21 0013   acetaminophen (TYLENOL) tablet 650 mg  650 mg Oral Q6H Jose Persia, MD   650 mg at 01/25/21 1230   alteplase (CATHFLO ACTIVASE) injection 2 mg  2 mg Intracatheter Once PRN Jerline Pain, MD       ceFAZolin (ANCEF) IVPB 2g/100 mL premix  2 g Intravenous Q M,W,F-1800 Thayer Headings, MD 200 mL/hr at 01/24/21 2116 2 g at 01/24/21 2116   Chlorhexidine  Gluconate Cloth 2 % PADS 6 each  6 each Topical Q0600 Jerline Pain, MD   6 each at 01/24/21 0555   Chlorhexidine Gluconate Cloth 2 % PADS 6 each  6 each Topical Q0600 Jerline Pain, MD   6 each at 01/25/21 0557   [START ON 01/26/2021] Darbepoetin Alfa (ARANESP) injection 60 mcg  60 mcg Intravenous Q Fri-HD Pham, Minh Q, RPH-CPP       doxercalciferol (HECTOROL) injection 4 mcg  4 mcg Intravenous Q M,W,F-HD Jerline Pain, MD   4 mcg at 01/24/21 2128   heparin injection 1,000 Units  1,000 Units Dialysis PRN Jerline Pain, MD       heparin injection 1,400 Units  20 Units/kg Dialysis PRN Jerline Pain, MD       heparin injection 2,200 Units  2,200 Units  Dialysis Once in dialysis Jerline Pain, MD       heparin injection 5,000 Units  5,000 Units Subcutaneous Q8H Jose Persia, MD   5,000 Units at 01/25/21 1449   insulin aspart (novoLOG) injection 3-9 Units  3-9 Units Subcutaneous Q4H Jerline Pain, MD   3 Units at 01/25/21 0059   lidocaine (LIDODERM) 5 % 1 patch  1 patch Transdermal Q24H Mosetta Anis, MD   1 patch at 01/25/21 1448   lidocaine (PF) (XYLOCAINE) 1 % injection 5 mL  5 mL Intradermal PRN Jerline Pain, MD       lidocaine-prilocaine (EMLA) cream 1 application  1 application Topical PRN Jerline Pain, MD       midodrine (PROAMATINE) tablet 10 mg  10 mg Oral TID WC Jerline Pain, MD   10 mg at 01/25/21 1230   multivitamin (RENA-VIT) tablet 1 tablet  1 tablet Oral QHS Jerline Pain, MD   1 tablet at 01/24/21 2215   ondansetron (ZOFRAN) tablet 4 mg  4 mg Oral Q6H PRN Jerline Pain, MD       Or   ondansetron (ZOFRAN) injection 4 mg  4 mg Intravenous Q6H PRN Jerline Pain, MD   4 mg at 01/20/21 0934   oxyCODONE (Oxy IR/ROXICODONE) immediate release tablet 5-10 mg  5-10 mg Oral Q6H PRN Jerline Pain, MD   5 mg at 01/25/21 1005   pentafluoroprop-tetrafluoroeth (GEBAUERS) aerosol 1 application  1 application Topical PRN Jerline Pain, MD       sucroferric oxyhydroxide (VELPHORO) chewable tablet 1,000 mg  1,000 mg Oral TID WC Jerline Pain, MD   1,000 mg at 01/25/21 1230     Discharge Medications: Please see discharge summary for a list of discharge medications.  Relevant Imaging Results:  Relevant Lab Results:   Additional Information SSN [id number], covid vaccinated with booster  Emeterio Reeve, LCSWA

## 2021-01-25 NOTE — Progress Notes (Addendum)
Subjective: No acute events overnight.   [name] was evaluated with the help of Spanish-speaking medical interpretor. He reports feeling better this morning with incremental improvement in his LUE pain. He continues to endorse pain when moving his left arm, but is comfortable at rest. We discussed negative results of AV fistulagram yesterday. His chronic low back pain is about the same, he received a dose of prn oxycodone this morning. We discussed PT/OT recommendations for discharge to SNF for further strengthening/rehabilitation and he agrees to that plan.  Objective:  Vital signs in last 24 hours: Vitals:   01/24/21 2000 01/24/21 2132 01/24/21 2217 01/25/21 0439  BP: (!) 123/56 (!) 155/65 (!) 139/51 124/65  Pulse: 60 66 67 71  Resp: (!) '9 10 13 16  '$ Temp:  98.3 F (36.8 C) 98.9 F (37.2 C)   TempSrc:  Oral Oral   SpO2: 100% 100% 99% 96%  Weight:  67 kg    Height:       Weight change:   Intake/Output Summary (Last 24 hours) at 01/25/2021 1158 Last data filed at 01/25/2021 0900 Gross per 24 hour  Intake 120 ml  Output 2080 ml  Net -1960 ml   Labs:  CBC Latest Ref Rng & Units 01/25/2021 01/24/2021 01/24/2021  WBC 4.0 - 10.5 K/uL 9.6 11.0(H) 10.8(H)  Hemoglobin 13.0 - 17.0 g/dL 8.7(L) 8.6(L) 8.7(L)  Hematocrit 39.0 - 52.0 % 26.4(L) 27.5(L) 27.3(L)  Platelets 150 - 400 K/uL 303 344 301    Physical Exam:  General: well-appearing gentleman resting in bed in no acute distress HENT: Normocephalic, atraumatic. Mucus membranes moist. Hearing intact CV: Regular rate and rhythm. Systolic murmur with no rubs or gallops. No LE edema Pulm: Lungs CTAB. No increased work of breathing on room air Abd: Nontender, nondistended. Normoactive bowel sounds MSK: L AV fistula covered in clean, dry bandage. LUE remains grossly larger than RUE but nontender, no increased warmth.  Neuro: Alert and grossly oriented. No focal neurologic deficits. Able to move all extremities  spontaneously Psych: Appropriate affect and thought process  Assessment/Plan:  Principal Problem:   Bacterial endocarditis Active Problems:   MSSA bacteremia   ESRD needing dialysis ([place])   Septic shock (HCC)   C. difficile diarrhea  [name] is a 65 year old Spanish-speaking gentleman with a history of HTN, DM2, and ESRD on HD who was admitted with septic shock requiring ICU stay and found to have MSSA bacteremia thought to be from infected AV fistula.   #LUE pain  He presented with pain and swelling of LUE that is gradually improving over time. CT of the LUE was negative for abscess and AV fistulagram yesterday did not reveal any impairment in flow or stenosis. On physical exam, his left forearm is no longer tender to palpation and there is no increased warmth or sign of infection. He is comfortable at rest but continues to have some pain with arm movement. The etiology of his arm pain remains unclear, but it is reassuring that his symptoms and physical exam continue to improve. We will continue to monitor. He agrees with PT/OT recommendations for discharge to SNF. He is medically stable for discharge once a SNF placement is found.  - AV fistulagram was negative. No vascular surgery indicated at this point, appreciate their recommendations - Continue acetaminophen and oxycodone '5mg'$  q6hrs prn for pain  - No further workup necessary at this time] - TOC consulted for SNF placement  #MSSA bacteremia with likely endocarditis #Septic shock, resolved Septic  pulmonary emboli were found on chest CT with negative TTE/TEE, being treated as endocarditis per ID. His repeat blood cultures were negative and his white count continues to trend down. ID was following and have now signed off.  - Continue Ancef with MWF dialysis through 02/28/21 per ID - Outpatient f/u with ID scheduled for 02/22/21  #ESRD on HD (MWF) - Appreciate nephrology managing inpatient dialysis - Next dialysis scheduled  for 6/17  #Dysphagia Patient noted difficulty swallowing pills and change in his voice a few days ago. SLP evaluated this morning and found no signs of dysphagia.  - Continue regular consistency diet  Code Status: FULL Diet: renal/carb modified with fluid restriction  Fluids: None VTE ppx: subq Heparin    LOS: 10 days   Theodosia Blender, Medical Student 01/25/2021, 11:58 AM

## 2021-01-25 NOTE — TOC Initial Note (Signed)
Transition of Care [facility]) - Initial/Assessment Note    Patient Details  Name: Cleland Simkins MRN: [id number] Date of Birth: 1956-03-28  Transition of Care [facility]) CM/SW Contact:    Emeterio Reeve, [place] Phone Number: 01/25/2021, 4:13 PM  Clinical Narrative:                  CSW met with pt and wife at bedside. CSW used interpreter Garciella for translation CSW introduced self and explained her role at the hospital.  PTA pt lived at home with his wife. Pt was independent  with mobility and ADL's. Pt was driving himself to and from dialysis.   CSW reviewed PT/OT notes for SNF. PT and wife agreed that SNF is needed for now. Wife gave CSW permission to fax out to facilities in the area.    TOC will follow.  Expected Discharge Plan: Skilled Nursing Facility Barriers to Discharge: Continued Medical Work up   Patient Goals and CMS Choice Patient states their goals for this hospitalization and ongoing recovery are:: to get stronger CMS Medicare.gov Compare Post Acute Care list provided to:: Patient Choice offered to / list presented to : Patient  Expected Discharge Plan and Services Expected Discharge Plan: [place] arrangements for the past 2 months: Single Family Home                                      Prior Living Arrangements/Services Living arrangements for the past 2 months: Single Family Home Lives with:: Self Patient language and need for interpreter reviewed:: Yes Do you feel safe going back to the place where you live?: Yes      Need for Family Participation in Patient Care: Yes (Comment) Care giver support system in place?: Yes (comment)   Criminal Activity/Legal Involvement Pertinent to Current Situation/Hospitalization: No - Comment as needed  Activities of Daily Living      Permission Sought/Granted Permission sought to share information with : Facility Art therapist granted to share information  with : Yes, Verbal Permission Granted     Permission granted to share info w AGENCY: SNF        Emotional Assessment Appearance:: Appears stated age Attitude/Demeanor/Rapport: Engaged Affect (typically observed): Appropriate Orientation: : Oriented to Self, Oriented to Place, Oriented to  Time, Oriented to Situation Alcohol / Substance Use: Not Applicable Psych Involvement: No (comment)  Admission diagnosis:  Dehydration [E86.0] Shock ([place]) [R57.9] Pneumonia [J18.9] Gallstones [K80.20] Swollen [R60.9] Hyperglycemia [R73.9] Diabetic gastroparesis ([place]) [E11.43, K31.84] Nausea and vomiting in adult [R11.2] ESRD needing dialysis ([place]) [N18.6, Z99.2] Intractable nausea and vomiting [R11.2] Sepsis ([place]) [A41.9] Patient Active Problem List   Diagnosis Date Noted   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  Emeterio Reeve, Latanya Presser, [place] [address] Social Worker [phone]

## 2021-01-25 NOTE — Evaluation (Signed)
Clinical/Bedside Swallow Evaluation Patient Details  Name: Ryan Wells MRN: [id number] Date of Birth: 09-07-1955  Today's Date: 01/25/2021 Time: SLP Start Time (ACUTE ONLY): 107 SLP Stop Time (ACUTE ONLY): 1030 SLP Time Calculation (min) (ACUTE ONLY): 12 min  Past Medical History:  Past Medical History:  Diagnosis Date   3rd nerve palsy, partial, right 01/03/2017   AAA (abdominal aortic aneurysm) ([place])    Not noted on CT abd 2019   Chest pain 11/2013   Normal Echo/ EKG/enzymes-hospitalized.  Did not get outpatient stress testing following hospitalization.  No chest pain since   Chronic kidney disease    on dialysis Tues, Thurs and Sat   Diabetes mellitus without complication ([place])    Diabetes type 2, uncontrolled ([place]) 11/25/2011   no meds, diet controlled per patient   Diabetic gastroparesis ([place]) 01/22/2017   Hypertension    no meds   Microalbuminuria 01/19/2017   Myocardial infarction [facility]) 2015   Past Surgical History:  Past Surgical History:  Procedure Laterality Date   A/V FISTULAGRAM Left 06/04/2019   Procedure: A/V FISTULAGRAM;  Surgeon: Angelia Mould, MD;  Location: [place] [facility];  Service: Cardiovascular;  Laterality: Left;   A/V FISTULAGRAM Left 10/15/2019   Procedure: A/V FISTULAGRAM;  Surgeon: Angelia Mould, MD;  Location: [place] [facility];  Service: Cardiovascular;  Laterality: Left;   A/V FISTULAGRAM N/A 01/24/2021   Procedure: A/V FISTULAGRAM - Left Upper;  Surgeon: Cherre Robins, MD;  Location: [place] [facility];  Service: Cardiovascular;  Laterality: N/A;   AV FISTULA PLACEMENT Left 07/02/2018   Procedure: BRACHIOCEPHALIC ARTERIOVENOUS (AV) FISTULA CREATION LEFT ARM;  Surgeon: Serafina Mitchell, MD;  Location: [place];  Service: Vascular;  Laterality: Left;   [place] Left 07/26/2019   Procedure: Bascilic Vein Transposition;  Surgeon: Angelia Mould, MD;  Location: [place];  Service: Vascular;  Laterality:  Left;   COLONOSCOPY     EMBOLIZATION Left 06/04/2019   Procedure: EMBOLIZATION;  Surgeon: Angelia Mould, MD;  Location: [place] [facility];  Service: Cardiovascular;  Laterality: Left;  LT ARM FISTULA/COMPETING BRANCH   EYE SURGERY Bilateral    cataracts x2   EYE SURGERY     INSERTION OF DIALYSIS CATHETER Right 06/08/2019   Procedure: INSERTION OF PALINDROME DIALYSIS CATHETER IN RIGHT INTERNAL JUGULAR;  Surgeon: Angelia Mould, MD;  Location: [place];  Service: Vascular;  Laterality: Right;   LIGATION OF ARTERIOVENOUS  FISTULA Left 07/26/2019   Procedure: Ligation Of BrachioCephalic  Fistula;  Surgeon: Angelia Mould, MD;  Location: [place];  Service: Vascular;  Laterality: Left;   NECK SURGERY     PERIPHERAL VASCULAR BALLOON ANGIOPLASTY Left 06/04/2019   Procedure: PERIPHERAL VASCULAR BALLOON ANGIOPLASTY;  Surgeon: Angelia Mould, MD;  Location: [place] [facility];  Service: Cardiovascular;  Laterality: Left;  ARM FISTULA   PERIPHERAL VASCULAR BALLOON ANGIOPLASTY Left 10/15/2019   Procedure: PERIPHERAL VASCULAR BALLOON ANGIOPLASTY;  Surgeon: Angelia Mould, MD;  Location: [place] [facility];  Service: Cardiovascular;  Laterality: Left;  arm fistula   TEE WITHOUT CARDIOVERSION N/A 01/19/2021   Procedure: TRANSESOPHAGEAL ECHOCARDIOGRAM (TEE);  Surgeon: Jerline Pain, MD;  Location: [facility];  Service: Cardiovascular;  Laterality: N/A;   HPI:  64 yo male wiht onset of N&V was admitted, had diarrhea and had to start in ICU.  Pt was dehydrated, had low Na, in lactic acidosis with fever, hypotensive.  PNA found, had MSSA bacteremia from unknown  cause with septic shock.  TEE cleared endocarditis as source, Fistula cleared on 6/15 of occlusion.  PMHx:  ESRD, HTN, DM,   Assessment / Plan / Recommendation Clinical Impression  Pt observed enjoying his omelet and coffee. No signs of dysphagia. No complaint from pt. Pt to continue his current diet. No SLP f/u needed  will sign off. SLP Visit Diagnosis: Dysphagia, unspecified (R13.10)    Aspiration Risk  No limitations    Diet Recommendation Regular;Thin liquid   Liquid Administration via: Straw;Cup Medication Administration: Whole meds with liquid Supervision: Patient able to self feed    Other  Recommendations     Follow up Recommendations None      Frequency and Duration            Prognosis        Swallow Study   General HPI: 65 yo male wiht onset of N&V was admitted, had diarrhea and had to start in ICU.  Pt was dehydrated, had low Na, in lactic acidosis with fever, hypotensive.  PNA found, had MSSA bacteremia from unknown cause with septic shock.  TEE cleared endocarditis as source, Fistula cleared on 6/15 of occlusion.  PMHx:  ESRD, HTN, DM, Type of Study: Bedside Swallow Evaluation Diet Prior to this Study: Regular;Thin liquids Temperature Spikes Noted: No Respiratory Status: Room air History of Recent Intubation: No Behavior/Cognition: Alert;Cooperative;Pleasant mood Oral Cavity Assessment: Within Functional Limits Oral Care Completed by SLP: No Oral Cavity - Dentition: Missing dentition Vision: Functional for self-feeding Self-Feeding Abilities: Able to feed self Patient Positioning: Upright in bed Baseline Vocal Quality: Normal Volitional Cough: Strong Volitional Swallow: Able to elicit    Oral/Motor/Sensory Function Overall Oral Motor/Sensory Function: Within functional limits   Ice Chips     Thin Liquid Thin Liquid: Within functional limits    Nectar Thick     Honey Thick     Puree Puree: Within functional limits   Solid     Solid: Within functional limits      Rayce Brahmbhatt, Katherene Ponto 01/25/2021,10:47 AM

## 2021-01-25 NOTE — Progress Notes (Signed)
[facility] Progress Note   Subjective: Seen in room. No C/Os.   Objective Vitals:   01/24/21 2000 01/24/21 2132 01/24/21 2217 01/25/21 0439  BP: (!) 123/56 (!) 155/65 (!) 139/51 124/65  Pulse: 60 66 67 71  Resp: (!) '9 10 13 16  '$ Temp:  98.3 F (36.8 C) 98.9 F (37.2 C)   TempSrc:  Oral Oral   SpO2: 100% 100% 99% 96%  Weight:  67 kg    Height:       Physical Exam General: Chronically ill appearing male in NAD Heart: S1,S2 RRR No M/R/G Lungs: CTAB  Abdomen: S, NT Extremities: No LE edema Dialysis Access: L AVF +T/B   Additional Objective Labs: Basic Metabolic Panel: Recent Labs  Lab 01/21/21 0132 01/22/21 0248 01/24/21 1920  NA 133* 130* 132*  K 3.7 4.8 3.3*  CL 96* 93* 95*  CO2 '25 22 27  '$ GLUCOSE 85 148* 104*  BUN 60* 77* 20  CREATININE 9.07* 11.22* 3.85*  CALCIUM 8.0* 7.9* 7.3*  PHOS 4.5 5.4* 3.1   Liver Function Tests: Recent Labs  Lab 01/21/21 0132 01/22/21 0248 01/24/21 1920  ALBUMIN 2.1* 2.0* 2.1*   No results for input(s): LIPASE, AMYLASE in the last 168 hours. CBC: Recent Labs  Lab 01/22/21 0248 01/23/21 0358 01/24/21 0123 01/24/21 1920 01/25/21 0129  WBC 14.9* 10.9* 10.8* 11.0* 9.6  NEUTROABS 11.5* 8.3* 8.2*  --   --   HGB 9.0* 9.3* 8.7* 8.6* 8.7*  HCT 27.6* 29.3* 27.3* 27.5* 26.4*  MCV 96.8 98.7 97.5 99.3 96.7  PLT 302 306 301 344 303   Blood Culture    Component Value Date/Time   SDES BLOOD RIGHT HAND 01/19/2021 0446   SDES BLOOD RIGHT HAND 01/19/2021 0446   SPECREQUEST  01/19/2021 0446    BOTTLES DRAWN AEROBIC ONLY Blood Culture adequate volume   SPECREQUEST  01/19/2021 0446    BOTTLES DRAWN AEROBIC ONLY Blood Culture adequate volume   CULT  01/19/2021 0446    NO GROWTH 5 DAYS Performed at [facility], [place] [address], [place], [place] [address]    CULT  01/19/2021 0446    NO GROWTH 5 DAYS Performed at [facility], [place] [address] [place], [place] [address]    REPTSTATUS 01/24/2021 FINAL  01/19/2021 0446   REPTSTATUS 01/24/2021 FINAL 01/19/2021 0446    Cardiac Enzymes: Recent Labs  Lab 01/21/21 0132  CKTOTAL 58   CBG: Recent Labs  Lab 01/24/21 1634 01/24/21 2212 01/25/21 0043 01/25/21 0436 01/25/21 0743  GLUCAP 131* 102* 139* 82 111*   Iron Studies: No results for input(s): IRON, TIBC, TRANSFERRIN, FERRITIN in the last 72 hours. '@lablastinr3'$ @ Studies/Results: PERIPHERAL VASCULAR CATHETERIZATION  Result Date: 01/24/2021 Formatting of this result is different from the original. Combine: 01/24/2021   PATIENT:  Nicola Girt  65 y.o. male   PRE-OPERATIVE DIAGNOSIS:  ESRD with left arm swelling   POST-OPERATIVE DIAGNOSIS:  Same   PROCEDURE:  left upper extremity fistulagram   SURGEON:  Surgeon(s) and Role:    * Cherre Robins, MD - Primary   ASSISTANT: none   ANESTHESIA:   local   EBL: min   BLOOD ADMINISTERED:none   DRAINS: none   LOCAL MEDICATIONS USED:  LIDOCAINE   SPECIMEN:  none   COUNTS: confirmed correct.   TOURNIQUET:  none   PATIENT DISPOSITION:  PACU - hemodynamically stable.  Delay start of Pharmacological VTE agent (>24hrs) due to surgical blood loss or  risk of bleeding: no   INDICATION FOR PROCEDURE: Shawndre Peel is a 65 y.o. male with ESRD dialyzing via left upper extremity brachiobasilic arteriovenous fistula. The patient had left arm swelling. After careful discussion of risks, benefits, and alternatives the patient was offered fistulagram. The patient understood and wished to proceed.   OPERATIVE FINDINGS: no flow limiting stenosis in left upper extremity central veins, previously placed basilic vein stent, or in inflow to fistula. Normal fistulagram. No source for swelling identified.   DESCRIPTION OF PROCEDURE: After identification of the patient in the pre-operative holding area, the patient was transferred to the operating room. The patient was positioned supine on the operating room table. Anesthesia was induced. The left arm was  prepped and draped in standard fashion. A surgical pause was performed confirming correct patient, procedure, and operative location.   The fistula was accessed with micropuncture technique.  Over the wire, a micro sheath was introduced into the fistula.  Fistulogram was performed in stations starting in the central veins.  No flow-limiting stenosis was identified in the left innominate vein, subclavian vein, axillary vein, basilic vein.  A previously placed stent was noted mid arm.  There is no stenosis associated with the stent.  The fistula was occluded and a reflux angiogram performed of the left upper extremity.  This revealed no inflow stenosis.  The access site was closed with a figure-of-eight Monocryl suture.   Upon completion of the case instrument and sharps counts were confirmed correct. The patient was transferred to the PACU in good condition. I was present for all portions of the procedure.   Yevonne Aline. Stanford Breed, MD [facility] Phone Number: [phone] 01/24/2021 1:16 PM  Medications:  sodium chloride Stopped (01/17/21 0414)   sodium chloride Stopped (01/19/21 0013)    ceFAZolin (ANCEF) IV 2 g (01/24/21 2116)    acetaminophen  650 mg Oral Q6H   Chlorhexidine Gluconate Cloth  6 each Topical Q0600   Chlorhexidine Gluconate Cloth  6 each Topical Q0600   doxercalciferol  4 mcg Intravenous Q M,W,F-HD   heparin  2,200 Units Dialysis Once in dialysis   heparin injection (subcutaneous)  5,000 Units Subcutaneous Q8H   insulin aspart  3-9 Units Subcutaneous Q4H   lidocaine  1 patch Transdermal Q24H   midodrine  10 mg Oral TID WC   multivitamin  1 tablet Oral QHS   sucroferric oxyhydroxide  1,000 mg Oral TID WC     HD Orders: [place] MWF 3h 59mn   400/500   68kg   2/2 bath  P4  AVF   -Heparin  2200  units IV TIW -Hectorol 4 mcg IV TIW   Assessment/ Plan: Septic shock - due to MSSA bacteremia. Shock resolved. Unclear source of sepsis, no signs of AVF  infection by exam. Seen by ID and currently on cefazolin. TEE 6/10 was negative for vegetation but being treated as though he has infective endocarditis. C/O  Will require 6-8 weeks Ancef as OP on discharge.  Pain/swelling LUE- LUE CT without evidence of abscess. F'gram 01/24/21 per [facility] which was WNL. Per primary  ESRD: MWF HD. Next HD 01/26/21 on schedule.  BP/ volume - BP controlled. Net UF 2.0 06/15 Post wt 67 kg. Slightly under EDW but was NPO 06/15. BP improved after addition of midodrine. UF as tolerated.  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 Start ESA with HD 01/26/21.  Transfuse prn.  Secondary hyperparathyroidism: PO4  at goal.  Continue binders, vitamin D.   Hyponatremia - resolved. Cdif diarrhea - getting Dificid per ID, diarrhea better Dysphagia-new problem. Seen by SLP-no issues with dysphagia found. They have signed off.    Helena Sardo H. Raenah Murley NP-C 01/25/2021, 11:30 AM  [employer] [phone]

## 2021-01-26 LAB — RENAL FUNCTION PANEL
Albumin: 2 g/dL — ABNORMAL LOW (ref 3.5–5.0)
Anion gap: 11 (ref 5–15)
BUN: 37 mg/dL — ABNORMAL HIGH (ref 8–23)
CO2: 27 mmol/L (ref 22–32)
Calcium: 8.3 mg/dL — ABNORMAL LOW (ref 8.9–10.3)
Chloride: 93 mmol/L — ABNORMAL LOW (ref 98–111)
Creatinine, Ser: 7.01 mg/dL — ABNORMAL HIGH (ref 0.61–1.24)
GFR, Estimated: 8 mL/min — ABNORMAL LOW (ref >60)
Glucose, Bld: 167 mg/dL — ABNORMAL HIGH (ref 70–99)
Phosphorus: 6.7 mg/dL — ABNORMAL HIGH (ref 2.5–4.6)
Potassium: 4.6 mmol/L (ref 3.5–5.1)
Sodium: 131 mmol/L — ABNORMAL LOW (ref 135–145)

## 2021-01-26 LAB — CBC
HCT: 26.7 % — ABNORMAL LOW (ref 39.0–52.0)
Hemoglobin: 8.6 g/dL — ABNORMAL LOW (ref 13.0–17.0)
MCH: 31.6 pg (ref 26.0–34.0)
MCHC: 32.2 g/dL (ref 30.0–36.0)
MCV: 98.2 fL (ref 80.0–100.0)
Platelets: 346 10*3/uL (ref 150–400)
RBC: 2.72 MIL/uL — ABNORMAL LOW (ref 4.22–5.81)
RDW: 13.9 % (ref 11.5–15.5)
WBC: 13.2 10*3/uL — ABNORMAL HIGH (ref 4.0–10.5)
nRBC: 0 % (ref 0.0–0.2)

## 2021-01-26 LAB — GLUCOSE, CAPILLARY
Glucose-Capillary: 74 mg/dL (ref 70–99)
Glucose-Capillary: 209 mg/dL — ABNORMAL HIGH (ref 70–99)
Glucose-Capillary: 186 mg/dL — ABNORMAL HIGH (ref 70–99)
Glucose-Capillary: 205 mg/dL — ABNORMAL HIGH (ref 70–99)
Glucose-Capillary: 97 mg/dL (ref 70–99)

## 2021-01-26 MED ORDER — GABAPENTIN 100 MG PO CAPS: 100.0000 mg | ORAL_CAPSULE | ORAL | Status: DC | PRN

## 2021-01-26 MED ORDER — SODIUM CHLORIDE 0.9 % IV SOLN: 100.0000 mL | INTRAVENOUS | Status: DC | PRN

## 2021-01-26 MED ORDER — PANTOPRAZOLE SODIUM 40 MG PO TBEC
40.0000 mg | DELAYED_RELEASE_TABLET | Freq: Every day | ORAL | Status: DC
  Administered 2021-01-27 – 2021-01-30 (×4): 40 mg via ORAL
  Filled 2021-01-26 (×4): qty 1

## 2021-01-26 MED ORDER — DOXERCALCIFEROL 4 MCG/2ML IV SOLN
INTRAVENOUS | Status: AC
  Administered 2021-01-26: 4 ug via INTRAVENOUS
  Filled 2021-01-26: qty 2

## 2021-01-26 MED ORDER — HEPARIN SODIUM (PORCINE) 1000 UNIT/ML IJ SOLN
INTRAMUSCULAR | Status: AC
  Administered 2021-01-26: 2200 [IU] via ARTERIOVENOUS_FISTULA
  Filled 2021-01-26: qty 1

## 2021-01-26 MED ORDER — HEPARIN SODIUM (PORCINE) 1000 UNIT/ML DIALYSIS: 2200.0000 [IU] | Freq: Once | INTRAMUSCULAR | Status: AC

## 2021-01-26 MED ORDER — PROSOURCE PLUS PO LIQD
30.0000 mL | Freq: Two times a day (BID) | ORAL | Status: DC
  Administered 2021-01-26 – 2021-01-30 (×9): 30 mL via ORAL
  Filled 2021-01-26 (×9): qty 30

## 2021-01-26 MED ORDER — DARBEPOETIN ALFA 60 MCG/0.3ML IJ SOSY
PREFILLED_SYRINGE | INTRAMUSCULAR | Status: AC
  Administered 2021-01-26: 60 ug via INTRAVENOUS
  Filled 2021-01-26: qty 0.3

## 2021-01-26 MED ORDER — NEPRO/CARBSTEADY PO LIQD
237.0000 mL | Freq: Two times a day (BID) | ORAL | Status: DC
  Administered 2021-01-27 – 2021-01-30 (×8): 237 mL via ORAL

## 2021-01-26 NOTE — Progress Notes (Addendum)
   Subjective:  No acute events overnight.   [name] was evaluated at bedside in the hemodialysis suite with the assistance of a Spanish interpretor via telephone. He noted some sharp, nonradiating chest discomfort that he usually experiences during or shortly after dialysis. He denies any difficulty breathing with the chest pain He reports slight improvement in LUE pain, with no discomfort in the left arm at rest. He reports discomfort from his chronic back pain due to not being able to change positions during dialysis. He continues to have persistent hiccups.   Objective:  Vital signs in last 24 hours: Vitals:   01/26/21 1027 01/26/21 1100 01/26/21 1130 01/26/21 1154  BP: (!) 114/52 (!) 117/54 (!) 120/54 127/60  Pulse: (!) 59 60 61 63  Resp: 12     Temp:      TempSrc:      SpO2:      Weight:      Height:       Weight change: -3.2 kg  Intake/Output Summary (Last 24 hours) at 01/26/2021 1258 Last data filed at 01/26/2021 0446 Gross per 24 hour  Intake 200 ml  Output 150 ml  Net 50 ml   Physical Exam:  General: Resting in bed in no acute distress HENT: Normocephalic, atraumatic. Mucus membranes moist. Hearing intact. Hiccups throughout exam CV: Regular rate and rhythm. Systolic murmur noted. Receiving hemodialysis through LUE AV fistula Pulm: Lungs CTAB. No wheezes or crackles Abd: Nontender, nondistended. Normoactive bowel sounds Neuro: Alert and grossly oriented. No focal neurologic deficits Psych: Appropriate affect and thought process  Assessment/Plan:  Principal Problem:   Bacterial endocarditis Active Problems:   MSSA bacteremia   ESRD needing dialysis ([place])   Septic shock (HCC)   C. difficile diarrhea  [name] is a 65 year old Spanish-speaking gentleman with a history of ESRD on HD, DM2, and hypertension who was admitted for nausea/vomiting with hospital course complicated by treatment in ICU for septic shock from MSSA bacteremia.   #MSSA  bacteremia #Bacterial endocarditis  #Septic shock, resolved  He is receiving appropriate antibiotic therapy for presumed bacterial endocarditis based on septic pulmonary emboli on chest CT. ID have signed off at this point and he will follow up with them in outpatient clinic. His vitals and physical exam are stable. He remains stable for discharge to SNF once a placement is found.  - Continue Ancef dosed after MWF hemodialysis through 02/28/21 per ID - Awaiting SNF placement   #Persistent hiccups He has had persistent hiccups throughout hospitalization that are contributing to his physical discomfort. Per chart review, he has been on PPI in the past but not at time of admission. Will restart PPI. Baclofen is typically used for hiccups but is contraindicated given his ESRD. Gabapentin also has evidence for use in persistent hiccups; will try this and dose after HD. - Start Protonix '40mg'$  daily   #ESRD on HD Appreciate nephrology managing inpatient HD.  - Received HD today   Code Status: FULL Diet: Renal/carb modified with fluid restriction  Fluids: None VTE ppx: subq Heparin    LOS: 11 days   Ryan Wells, Medical Student 01/26/2021, 12:58 PM

## 2021-01-26 NOTE — Discharge Instructions (Addendum)
[name] thank you for allowing [place] to care for you during your hospitalization.  You were initially mated to our services for nausea vomiting and diarrhea.  You were initially admitted to our hospital for these findings, he developed fever and was found to have a bacteria in your bloodstream thought to have started in your heart.  You were given IV antibiotics and will continue on IV antibiotics until 02/28/2021.    Please follow-up with your primary care provider by the end of the week or early next week.  Please also follow-up with your nephrologist and continue dialysis, your next session will be tomorrow.  You also have home physical therapy as well as Occupational Therapy to help you regain your strength.  We will also be ordering a walker, bedside commode, shower bench while you regain your strength.  Medication changes, please start taking midodrine 10 mg 3 times a day with meals.  Stop taking your amlodipine and furosemide.  Please follow-up with your primary care doctor within the next few days for a posthospital follow-up.  They may make medication adjustments at this time. ------------------------------------------------------------------------------------------------------------------------------------------------------------------------------------------------------ [name], gracias por permitirnos atenderlo durante su hospitalizacin. Inicialmente fue acoplado a nuestros servicios por nuseas, vmitos y [place]. Inicialmente, fue admitido en [facility] hospital por estos hallazgos, desarroll fiebre y se descubri que tena una bacteria en el torrente sanguneo que se cree que comenz en su corazn. Se le administraron antibiticos por va intravenosa y continuar con antibiticos por va intravenosa hasta el 20/02/2021.  Haga un seguimiento con su proveedor de atencin primaria al final de la semana o principios de la prxima. Por favor, tambin haga un seguimiento con su  nefrlogo y contine con la dilisis, su prxima sesin ser [employer] Tambin dispones de fisioterapia a domicilio as como de [employer] para ayudarte a Corporate investment banker. Tambin pediremos un andador, un inodoro junto a la cama y un banco para la ducha mientras recupera las fuerzas.  Cambios en la medicacin, comience a tomar midodrine 10 mg 3 veces al [employer]. Deje de tomar su amlodipina y furosemida. Realice un seguimiento con su mdico de atencin primaria en los prximos das para un seguimiento poshospitalario. Es posible que hagan ajustes en la medicacin en este momento.

## 2021-01-26 NOTE — Progress Notes (Signed)
PT Cancellation Note  Patient Details Name: Ryan Wells MRN: [id number] DOB: May 21, 1956   Cancelled Treatment:    Reason Eval/Treat Not Completed: Medical issues which prohibited therapy;Patient at procedure or test/unavailable.  Pt is in HD and will retry at another time.   Ramond Dial 01/26/2021, 1:10 PM  Mee Hives, PT MS Acute Rehab Dept. Number: [place] and [place]

## 2021-01-26 NOTE — Progress Notes (Signed)
Initial Nutrition Assessment  DOCUMENTATION CODES:   Not applicable  INTERVENTION:  -Nepro Shake po BID, each supplement provides 425 kcal and 19 grams protein -Continue PROSource PLUS PO 9ms BID, each supplement provides 100 kcals and 15 grams of protein -Continue renal MVI -Provided renal diet education  NUTRITION DIAGNOSIS:   Increased nutrient needs related to chronic illness (ESRD on HD) as evidenced by estimated needs.  GOAL:   Patient will meet greater than or equal to 90% of their needs  MONITOR:   PO intake, Supplement acceptance, Labs, Weight trends, I & O's  REASON FOR ASSESSMENT:   Consult Assessment of nutrition requirement/status  ASSESSMENT:   Pt with a PMH significant for ESRD on HD, type 2 DM, and HTN admitted with MSSA bacteremia with likely endocarditis (now stable on antibiotic treatment) who has had significant deconditioning during this acute illness  Per MD, pt is medically stable for discharge and awaiting SNF placement.  Pt initially c/o N/V upon admission, but has since had some improvement in these symptoms. Last 8 meal completions charted as 25-75% (~49% average meal intake). Pt with orders for Prosource Plus BID and doing well with supplement per RN. Will order additional oral nutrition supplements in hopes of increasing kcal/protein intake.   UOP: 1511mdocumented x24 hours Stool: 1x unmeasured occurrence x24 hours I/O: -75362mince admit  EDW 68kg Current weight: 67.4 kg  Admit weight: 69kg Per Nephrology, plan to lower EDW upon discharge  Medications: Scheduled Meds:  (feeding supplement) PROSource Plus  30 mL Oral BID BM   acetaminophen  650 mg Oral Q6H   Chlorhexidine Gluconate Cloth  6 each Topical Q0600   Chlorhexidine Gluconate Cloth  6 each Topical Q0600   darbepoetin (ARANESP) injection - DIALYSIS  60 mcg Intravenous Q Fri-HD   doxercalciferol  4 mcg Intravenous Q M,W,F-HD   [START ON 01/27/2021] feeding supplement (NEPRO  CARB STEADY)  237 mL Oral BID BM   heparin  2,200 Units Dialysis Once in dialysis   heparin injection (subcutaneous)  5,000 Units Subcutaneous Q8H   insulin aspart  3-9 Units Subcutaneous Q4H   lidocaine  1 patch Transdermal Q24H   midodrine  10 mg Oral TID WC   multivitamin  1 tablet Oral QHS   [START ON 01/27/2021] pantoprazole  40 mg Oral Daily   sucroferric oxyhydroxide  1,000 mg Oral TID WC  Continuous Infusions:  sodium chloride Stopped (01/17/21 0414)   sodium chloride Stopped (01/19/21 0013)    ceFAZolin (ANCEF) IV 2 g (01/26/21 1738)   Labs: Recent Labs  Lab 01/20/21 0856 01/21/21 0132 01/22/21 0248 01/24/21 1920 01/26/21 0843  NA 133*   < > 130* 132* 131*  K 3.6   < > 4.8 3.3* 4.6  CL 96*   < > 93* 95* 93*  CO2 23   < > '22 27 27  '$ BUN 51*   < > 77* 20 37*  CREATININE 7.87*   < > 11.22* 3.85* 7.01*  CALCIUM 7.9*   < > 7.9* 7.3* 8.3*  MG 2.7*  --   --   --   --   PHOS 3.5   < > 5.4* 3.1 6.7*  GLUCOSE 152*   < > 148* 104* 167*   < > = values in this interval not displayed.  CBGs [phone]  Diet Order:   Diet Order             Diet renal/carb modified with fluid restriction Fluid restriction: 1200 mL Fluid;  Room service appropriate? No; Fluid consistency: Thin  Diet effective now                   EDUCATION NEEDS:   Education needs have been addressed  Skin:  Skin Assessment: Reviewed RN Assessment  Last BM:  6/17 type 4  Height:   Ht Readings from Last 1 Encounters:  01/19/21 '5\' 2"'$  (1.575 m)    Weight:   Wt Readings from Last 10 Encounters:  01/26/21 67.4 kg  05/22/20 66.7 kg  03/21/20 66.7 kg  01/07/20 64 kg  12/29/19 64 kg  10/15/19 65 kg  10/13/19 63 kg  07/30/19 63 kg  07/26/19 62.6 kg  07/21/19 62.1 kg   BMI:  Body mass index is 27.18 kg/m.  Estimated Nutritional Needs:   Kcal:  1800-2000  Protein:  90-100g  Fluid:  1L+UOP    Larkin Ina, MS, RD, LDN Pronouns: She/Her/Hers RD pager number and weekend/on-call  pager number located in [place].

## 2021-01-26 NOTE — Progress Notes (Signed)
Occupational Therapy Treatment Patient Details Name: Ryan Wells MRN: [id number] DOB: 03/08/1956 Today's Date: 01/26/2021    History of present illness 65 yo male wiht onset of N&V was admitted, had diarrhea and had to start in ICU.  Pt was dehydrated, had low Na, in lactic acidosis with fever, hypotensive.  PNA found, had MSSA bacteremia from unknown cause with septic shock.  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  Fistula cleared on 6/15 of occlusion.  PMHx:  ESRD, HTN, DM,   OT comments  Pt making progress with OT goals this session. Pt complete multiple sit<>stands, transfers, and seated/standing exercises this session. Pt demonstrated continued decreased activity tolerance, however, pt able to continue therapy/activity after short rest breaks. Pt motivated to return to independence with all ADL's and functional mobility. Acute OT to continue following to assist pt with progressing his goals.    Follow Up Recommendations  SNF;Supervision/Assistance - 24 hour    Equipment Recommendations  Tub/shower bench;3 in 1 bedside commode    Recommendations for Other Services      Precautions / Restrictions Precautions Precautions: Fall Precaution Comments: monitor vitals, light headed with mobility Restrictions Weight Bearing Restrictions: No       Mobility Bed Mobility Overal bed mobility: Needs Assistance Bed Mobility: Supine to Sit     Supine to sit: Min assist;HOB elevated     General bed mobility comments: Increased time to come to sitting and min A with elevating trunk    Transfers Overall transfer level: Needs assistance Equipment used: Rolling walker (2 wheeled) Transfers: Sit to/from Stand Sit to Stand: Min guard         General transfer comment: Min guard for safety, no physical assistance given.    Balance Overall balance assessment: Needs assistance;History of Falls Sitting-balance support: No upper extremity supported;Feet  supported Sitting balance-Leahy Scale: Fair Sitting balance - Comments: Pt had 1 LOB when scooting his hips forward on bed   Standing balance support: Bilateral upper extremity supported Standing balance-Leahy Scale: Fair                             ADL either performed or assessed with clinical judgement   ADL Overall ADL's : Needs assistance/impaired Eating/Feeding: Independent;Sitting                       Toilet Transfer: Minimal assistance;Ambulation (Short distances)           Functional mobility during ADLs: Minimal assistance;Rolling walker General ADL Comments: Pt able to ambulate short distances this session, requiring min A for balance and steadying when standing     Vision   Vision Assessment?: No apparent visual deficits   Perception     Praxis      Cognition Arousal/Alertness: Awake/alert Behavior During Therapy: WFL for tasks assessed/performed Overall Cognitive Status: Within Functional Limits for tasks assessed                                          Exercises Exercises: Other exercises Other Exercises Other Exercises: Seated marching, BLE, 10 reps Other Exercises: Seated kicks, BLE, 10 reps Other Exercises: Mini squats, standing, 10 reps Other Exercises: Seated push outs w/ resistance, 10 reps, BUE Other Exercises: Pulling in/bicep curls, resistance given, BUE, 10 reps   Shoulder Instructions  General Comments VSS on Ra    Pertinent Vitals/ Pain       Pain Assessment: 0-10 Pain Score: 10-Worst pain ever Pain Location: Lower back Pain Descriptors / Indicators: Aching;Discomfort;Grimacing;Moaning Pain Intervention(s): Monitored during session;Repositioned;RN gave pain meds during session  Home Living                                          Prior Functioning/Environment              Frequency  Min 2X/week        Progress Toward Goals  OT Goals(current goals can  now be found in the care plan section)  Progress towards OT goals: Progressing toward goals  Acute Rehab OT Goals Patient Stated Goal: To reduce pain OT Goal Formulation: With patient Time For Goal Achievement: 02/07/21 Potential to Achieve Goals: Good ADL Goals Pt Will Perform Lower Body Dressing: with supervision;with adaptive equipment;sitting/lateral leans;sit to/from stand Pt Will Transfer to Toilet: with modified independence;ambulating Pt Will Perform Toileting - Clothing Manipulation and hygiene: with modified independence;with adaptive equipment;sitting/lateral leans;sit to/from stand Additional ADL Goal #1: Pt will tolerate standing for 3 grooming tasks at the sink  Plan Discharge plan remains appropriate;Frequency remains appropriate    Co-evaluation                 AM-PAC OT "6 Clicks" Daily Activity     Outcome Measure   Help from another person eating meals?: None Help from another person taking care of personal grooming?: A Little Help from another person toileting, which includes using toliet, bedpan, or urinal?: A Little Help from another person bathing (including washing, rinsing, drying)?: A Lot Help from another person to put on and taking off regular upper body clothing?: A Little Help from another person to put on and taking off regular lower body clothing?: A Lot 6 Click Score: 17    End of Session Equipment Utilized During Treatment: Rolling walker  OT Visit Diagnosis: Unsteadiness on feet (R26.81);Other abnormalities of gait and mobility (R26.89);Muscle weakness (generalized) (M62.81)   Activity Tolerance Patient tolerated treatment well   Patient Left in chair;with call bell/phone within reach;with chair alarm set   Nurse Communication Mobility status        Time: [id number] OT Time Calculation (min): 23 min  Charges: OT General Charges $OT Visit: 1 Visit OT Treatments $Self Care/Home Management : 8-22 mins $Therapeutic Activity: 8-22  mins  Ryan Wells H., OTR/L Acute Rehabilitation   Quanita Barona Elane Lenay Lovejoy 01/26/2021, 4:02 PM

## 2021-01-26 NOTE — Progress Notes (Signed)
[facility] Progress Note   Subjective:   Seen on HD this morning - 2L UFG and tolerating. Denies chest pain or dyspnea. LUE remains edematous and with persistent cannulation issues this morning, however able to get running eventually.  Objective Vitals:   01/25/21 1343 01/25/21 2023 01/26/21 0100 01/26/21 0444  BP: (!) 142/65 (!) 137/56 (!) 151/60 (!) 131/58  Pulse: 66 (!) 59 73 64  Resp: '16 17 15 16  '$ Temp: 99.1 F (37.3 C) 98 F (36.7 C) (!) 97.5 F (36.4 C) 98.1 F (36.7 C)  TempSrc: Oral  Oral Oral  SpO2: 98% 99% 100% 100%  Weight:    66.7 kg  Height:       Physical Exam General: Chronically ill appearing man, NAD Heart: RRR; no murmur Lungs: CTA anteriorly Abdomen: soft, non-tender Extremities: No LE edema Dialysis Access: LUE AVF + thrill  Additional Objective Labs: Basic Metabolic Panel: Recent Labs  Lab 01/21/21 0132 01/22/21 0248 01/24/21 1920  NA 133* 130* 132*  K 3.7 4.8 3.3*  CL 96* 93* 95*  CO2 '25 22 27  '$ GLUCOSE 85 148* 104*  BUN 60* 77* 20  CREATININE 9.07* 11.22* 3.85*  CALCIUM 8.0* 7.9* 7.3*  PHOS 4.5 5.4* 3.1   Liver Function Tests: Recent Labs  Lab 01/21/21 0132 01/22/21 0248 01/24/21 1920  ALBUMIN 2.1* 2.0* 2.1*   CBC: Recent Labs  Lab 01/22/21 0248 01/23/21 0358 01/24/21 0123 01/24/21 1920 01/25/21 0129  WBC 14.9* 10.9* 10.8* 11.0* 9.6  NEUTROABS 11.5* 8.3* 8.2*  --   --   HGB 9.0* 9.3* 8.7* 8.6* 8.7*  HCT 27.6* 29.3* 27.3* 27.5* 26.4*  MCV 96.8 98.7 97.5 99.3 96.7  PLT 302 306 301 344 303   Blood Culture    Component Value Date/Time   SDES BLOOD RIGHT HAND 01/19/2021 0446   SDES BLOOD RIGHT HAND 01/19/2021 0446   SPECREQUEST  01/19/2021 0446    BOTTLES DRAWN AEROBIC ONLY Blood Culture adequate volume   SPECREQUEST  01/19/2021 0446    BOTTLES DRAWN AEROBIC ONLY Blood Culture adequate volume   CULT  01/19/2021 0446    NO GROWTH 5 DAYS Performed at [facility], [place] [address], [place],  [place] [address]    CULT  01/19/2021 0446    NO GROWTH 5 DAYS Performed at [facility], [place] [address] [place], [place] [address]    REPTSTATUS 01/24/2021 FINAL 01/19/2021 0446   REPTSTATUS 01/24/2021 FINAL 01/19/2021 0446    Cardiac Enzymes: Recent Labs  Lab 01/21/21 0132  CKTOTAL 54   CBG: Recent Labs  Lab 01/25/21 1707 01/25/21 2020 01/26/21 0030 01/26/21 0441 01/26/21 0644  GLUCAP 145* 141* 74 209* 186*   Studies/Results: PERIPHERAL VASCULAR CATHETERIZATION  Result Date: 01/24/2021 Formatting of this result is different from the original. [place]: 01/24/2021   PATIENT:  Ryan Wells  65 y.o. male   PRE-OPERATIVE DIAGNOSIS:  ESRD with left arm swelling   POST-OPERATIVE DIAGNOSIS:  Same   PROCEDURE:  left upper extremity fistulagram   SURGEON:  Surgeon(s) and Role:    * Cherre Robins, MD - Primary   ASSISTANT: none   ANESTHESIA:   local   EBL: min   BLOOD ADMINISTERED:none   DRAINS: none   LOCAL MEDICATIONS USED:  LIDOCAINE   SPECIMEN:  none   COUNTS: confirmed correct.   TOURNIQUET:  none   PATIENT DISPOSITION:  PACU - hemodynamically stable.  Delay start of Pharmacological VTE agent (>24hrs)  due to surgical blood loss or risk of bleeding: no   INDICATION FOR PROCEDURE: Ryan Wells is a 65 y.o. male with ESRD dialyzing via left upper extremity brachiobasilic arteriovenous fistula. The patient had left arm swelling. After careful discussion of risks, benefits, and alternatives the patient was offered fistulagram. The patient understood and wished to proceed.   OPERATIVE FINDINGS: no flow limiting stenosis in left upper extremity central veins, previously placed basilic vein stent, or in inflow to fistula. Normal fistulagram. No source for swelling identified.   DESCRIPTION OF PROCEDURE: After identification of the patient in the pre-operative holding area, the patient was transferred to the operating room. The patient was positioned supine on the operating  room table. Anesthesia was induced. The left arm was prepped and draped in standard fashion. A surgical pause was performed confirming correct patient, procedure, and operative location.   The fistula was accessed with micropuncture technique.  Over the wire, a micro sheath was introduced into the fistula.  Fistulogram was performed in stations starting in the central veins.  No flow-limiting stenosis was identified in the left innominate vein, subclavian vein, axillary vein, basilic vein.  A previously placed stent was noted mid arm.  There is no stenosis associated with the stent.  The fistula was occluded and a reflux angiogram performed of the left upper extremity.  This revealed no inflow stenosis.  The access site was closed with a figure-of-eight Monocryl suture.   Upon completion of the case instrument and sharps counts were confirmed correct. The patient was transferred to the PACU in good condition. I was present for all portions of the procedure.   Yevonne Aline. Stanford Breed, MD [facility] Phone Number: [phone] 01/24/2021 1:16 PM   Medications:  sodium chloride Stopped (01/17/21 0414)   sodium chloride Stopped (01/19/21 0013)    ceFAZolin (ANCEF) IV 2 g (01/24/21 2116)    acetaminophen  650 mg Oral Q6H   Chlorhexidine Gluconate Cloth  6 each Topical Q0600   Chlorhexidine Gluconate Cloth  6 each Topical Q0600   darbepoetin (ARANESP) injection - DIALYSIS  60 mcg Intravenous Q Fri-HD   doxercalciferol  4 mcg Intravenous Q M,W,F-HD   heparin  2,200 Units Dialysis Once in dialysis   heparin injection (subcutaneous)  5,000 Units Subcutaneous Q8H   insulin aspart  3-9 Units Subcutaneous Q4H   lidocaine  1 patch Transdermal Q24H   midodrine  10 mg Oral TID WC   multivitamin  1 tablet Oral QHS   sucroferric oxyhydroxide  1,000 mg Oral TID WC    Dialysis Orders: MWF at [facility] 3h 58mn   400/500   68kg   2/2 bath  P4  AVF   - Heparin  2200   units IV TIW - Hectorol 4 mcg IV TIW  Assessment/Plan: 1. Septic Shock/MSSA bacteremia: Unclear source, AVF without signs of infection. TEE 6/10 negative for vegetations, however treating for 6 weeks with Cefazolin (through 7/20) per ID. 2. LUE edema/pain: LUE CT without abscess, then F'gram 6/15 which was negative. 3. C. Diff diarrhea: Finished course of Dificid, per ID. 4. ESRD: Continue HD per MWF schedule - HD today. 5. BP/volume: BP stable - remains on midodrine. EDW will be lower on discharge. 2L UFG today. 6. Anemia of ESRD: Hgb trending down - giving Aranesp 662m today. 7. Secondary hyperparathyroidism: CorrCa/Phos ok - continue Velphoro + VDRA. 8. Nutrition: Alb very low, adding supplements.  KaVeneta PentonPA-C 01/26/2021, 8:19  AM  [employer]

## 2021-01-27 LAB — RENAL FUNCTION PANEL
Albumin: 2 g/dL — ABNORMAL LOW (ref 3.5–5.0)
Anion gap: 10 (ref 5–15)
BUN: 14 mg/dL (ref 8–23)
CO2: 28 mmol/L (ref 22–32)
Calcium: 8.6 mg/dL — ABNORMAL LOW (ref 8.9–10.3)
Chloride: 93 mmol/L — ABNORMAL LOW (ref 98–111)
Creatinine, Ser: 4.06 mg/dL — ABNORMAL HIGH (ref 0.61–1.24)
GFR, Estimated: 15 mL/min — ABNORMAL LOW (ref >60)
Glucose, Bld: 90 mg/dL (ref 70–99)
Phosphorus: 4.5 mg/dL (ref 2.5–4.6)
Potassium: 4.3 mmol/L (ref 3.5–5.1)
Sodium: 131 mmol/L — ABNORMAL LOW (ref 135–145)

## 2021-01-27 LAB — CBC WITH DIFFERENTIAL/PLATELET
Abs Immature Granulocytes: 0.14 10*3/uL — ABNORMAL HIGH (ref 0.00–0.07)
Basophils Absolute: 0.1 10*3/uL (ref 0.0–0.1)
Basophils Relative: 1 %
Eosinophils Absolute: 0 10*3/uL (ref 0.0–0.5)
Eosinophils Relative: 0 %
HCT: 27.6 % — ABNORMAL LOW (ref 39.0–52.0)
Hemoglobin: 8.9 g/dL — ABNORMAL LOW (ref 13.0–17.0)
Immature Granulocytes: 1 %
Lymphocytes Relative: 18 %
Lymphs Abs: 1.9 10*3/uL (ref 0.7–4.0)
MCH: 31.7 pg (ref 26.0–34.0)
MCHC: 32.2 g/dL (ref 30.0–36.0)
MCV: 98.2 fL (ref 80.0–100.0)
Monocytes Absolute: 0.6 10*3/uL (ref 0.1–1.0)
Monocytes Relative: 6 %
Neutro Abs: 7.8 10*3/uL — ABNORMAL HIGH (ref 1.7–7.7)
Neutrophils Relative %: 74 %
Platelets: 311 10*3/uL (ref 150–400)
RBC: 2.81 MIL/uL — ABNORMAL LOW (ref 4.22–5.81)
RDW: 13.9 % (ref 11.5–15.5)
WBC: 10.6 10*3/uL — ABNORMAL HIGH (ref 4.0–10.5)
nRBC: 0 % (ref 0.0–0.2)

## 2021-01-27 LAB — GLUCOSE, CAPILLARY
Glucose-Capillary: 100 mg/dL — ABNORMAL HIGH (ref 70–99)
Glucose-Capillary: 115 mg/dL — ABNORMAL HIGH (ref 70–99)
Glucose-Capillary: 126 mg/dL — ABNORMAL HIGH (ref 70–99)
Glucose-Capillary: 135 mg/dL — ABNORMAL HIGH (ref 70–99)
Glucose-Capillary: 136 mg/dL — ABNORMAL HIGH (ref 70–99)
Glucose-Capillary: 97 mg/dL (ref 70–99)

## 2021-01-27 MED ORDER — INSULIN ASPART 100 UNIT/ML IJ SOLN
0.0000 [IU] | Freq: Three times a day (TID) | INTRAMUSCULAR | Status: DC
  Administered 2021-01-28 – 2021-01-30 (×4): 2 [IU] via SUBCUTANEOUS

## 2021-01-27 NOTE — Plan of Care (Signed)

## 2021-01-27 NOTE — TOC Progression Note (Deleted)
Transition of Care [facility]) - Progression Note    Patient Details  Name: Ryan Wells MRN: [id number] Date of Birth: 1956-02-10  Transition of Care [facility]) CM/SW [place], [place] Phone Number: 01/27/2021, 12:23 PM  Clinical Narrative:     Pt has no Snf offerings at this time.  TOC will continue to assist with disposition planning.  Expected Discharge Plan: [place] Barriers to Discharge: Continued Medical Work up  Expected Discharge Plan and Services Expected Discharge Plan: [place] arrangements for the past 2 months: Single Family Home                                       Social Determinants of Health (SDOH) Interventions    Readmission Risk Interventions No flowsheet data found.

## 2021-01-27 NOTE — TOC Progression Note (Signed)
Transition of Care [facility]) - Progression Note    Patient Details  Name: Ryan Wells MRN: [id number] Date of Birth: [birth year]  Transition of Care [facility]) CM/SW [place], [place] Phone Number: 01/27/2021, 12:06 PM  Clinical Narrative:    Possible SNF's placements are pending.  TOC will continue to assist with disposition planning.   Expected Discharge Plan: [place] Barriers to Discharge: Continued Medical Work up  Expected Discharge Plan and Services Expected Discharge Plan: [place] arrangements for the past 2 months: Single Family Home                                       Social Determinants of Health (SDOH) Interventions    Readmission Risk Interventions No flowsheet data found.

## 2021-01-27 NOTE — Progress Notes (Signed)
   Subjective:   [name] states he has been having a difficult time with sleeping. He endorses this is due to back and leg pain that is persistent but unchanged.   Objective:  Vital signs in last 24 hours: Vitals:   01/26/21 1411 01/26/21 2101 01/27/21 0412 01/27/21 1404  BP: (!) 102/47 (!) 136/54 (!) 138/58 (!) 142/61  Pulse: 72 63 67 64  Resp: '17 17 17 16  '$ Temp: 99.3 F (37.4 C) 99.7 F (37.6 C) 98.2 F (36.8 C) 98.9 F (37.2 C)  TempSrc:  Oral  Oral  SpO2: 100% 98% 99% 100%  Weight:   67.4 kg   Height:       Physical Exam Vitals and nursing note reviewed.  Constitutional:      General: He is not in acute distress.    Appearance: He is normal weight.     Comments: Hiccups every 1 min   HENT:     Head: Normocephalic and atraumatic.  Pulmonary:     Effort: Pulmonary effort is normal. No respiratory distress.  Abdominal:     General: Bowel sounds are normal.     Tenderness: There is no abdominal tenderness.  Genitourinary:    Testes:        Right: Swelling not present.        Left: Swelling not present.  Skin:    General: Skin is warm and dry.  Neurological:     General: No focal deficit present.     Mental Status: He is alert and oriented to person, place, and time.  Psychiatric:        Mood and Affect: Mood normal.        Behavior: Behavior normal.   Assessment/Plan:  Principal Problem:   Bacterial endocarditis Active Problems:   MSSA bacteremia   ESRD needing dialysis ([place])   Septic shock (HCC)   C. difficile diarrhea  [name] is a 65 year old Spanish-speaking gentleman with a history of ESRD on HD, DM2, and hypertension who was admitted for nausea/vomiting with hospital course complicated by treatment in ICU for septic shock from MSSA bacteremia.   #MSSA bacteremia #Bacterial endocarditis #Septic shock, resolved He is receiving appropriate antibiotic therapy for bacteremia complicated by bacterial endocarditis and septic pulmonary  emboli.  - Continue Ancef dosed after MWF hemodialysis through 02/28/21 per ID - Awaiting SNF placement   #Persistent hiccups Continues to be a concern today.   - Continue Protonix '40mg'$  daily - Consider Gabapentin if no resolution    #ESRD on HD -Appreciate nephrology managing inpatient HD.  #Back Pain Likely musculoskeletal in nature.   -Continue working with PT/OT -Tylenol scheduled -Oxycodone PRN   Code Status: FULL Diet: Renal/carb modified with fluid restriction  Fluids: None VTE ppx: subq Heparin  Prior to Admission Living Arrangement: Home Anticipated Discharge Location: SNF Barriers to Discharge: Placement to SNF  Dispo: Anticipated discharge in approximately 0-1 day(s).   Dr. Jose Persia Internal Medicine PGY-2  Pager: [phone] After 5pm on weekdays and 1pm on weekends: On Call pager [phone]  01/27/2021, 2:24 PM

## 2021-01-27 NOTE — Progress Notes (Signed)
[facility] Progress Note   Subjective:  Seen in room. Says dialysis was rough yesterday b/c he had diarrhea at the end, which has been ongoing today. Denies CP or dyspnea.  Objective Vitals:   01/26/21 1230 01/26/21 1411 01/26/21 2101 01/27/21 0412  BP: (!) 130/59 (!) 102/47 (!) 136/54 (!) 138/58  Pulse: 72 72 63 67  Resp: '16 17 17 17  '$ Temp: 98.2 F (36.8 C) 99.3 F (37.4 C) 99.7 F (37.6 C) 98.2 F (36.8 C)  TempSrc: Oral  Oral   SpO2: 98% 100% 98% 99%  Weight:    67.4 kg  Height:       Physical Exam General: Chronically ill appearing man, NAD Heart: RRR; no murmur Lungs: CTA anteriorly Abdomen: soft, non-tender Extremities: No LE edema Dialysis Access: LUE AVF + thrill  Additional Objective Labs: Basic Metabolic Panel: Recent Labs  Lab 01/24/21 1920 01/26/21 0843 01/27/21 0204  NA 132* 131* 131*  K 3.3* 4.6 4.3  CL 95* 93* 93*  CO2 '27 27 28  '$ GLUCOSE 104* 167* 90  BUN 20 37* 14  CREATININE 3.85* 7.01* 4.06*  CALCIUM 7.3* 8.3* 8.6*  PHOS 3.1 6.7* 4.5   Liver Function Tests: Recent Labs  Lab 01/24/21 1920 01/26/21 0843 01/27/21 0204  ALBUMIN 2.1* 2.0* 2.0*   CBC: Recent Labs  Lab 01/23/21 0358 01/24/21 0123 01/24/21 1920 01/25/21 0129 01/26/21 0843 01/27/21 0204  WBC 10.9* 10.8* 11.0* 9.6 13.2* 10.6*  NEUTROABS 8.3* 8.2*  --   --   --  7.8*  HGB 9.3* 8.7* 8.6* 8.7* 8.6* 8.9*  HCT 29.3* 27.3* 27.5* 26.4* 26.7* 27.6*  MCV 98.7 97.5 99.3 96.7 98.2 98.2  PLT 306 301 344 303 346 311   Blood Culture    Component Value Date/Time   SDES BLOOD RIGHT HAND 01/19/2021 0446   SDES BLOOD RIGHT HAND 01/19/2021 0446   SPECREQUEST  01/19/2021 0446    BOTTLES DRAWN AEROBIC ONLY Blood Culture adequate volume   SPECREQUEST  01/19/2021 0446    BOTTLES DRAWN AEROBIC ONLY Blood Culture adequate volume   CULT  01/19/2021 0446    NO GROWTH 5 DAYS Performed at [facility], [place] [address] [place], [place] [address]    CULT  01/19/2021  0446    NO GROWTH 5 DAYS Performed at [facility], [place] [address], [place], [place] [address]    REPTSTATUS 01/24/2021 FINAL 01/19/2021 0446   REPTSTATUS 01/24/2021 FINAL 01/19/2021 0446   Medications:  sodium chloride Stopped (01/17/21 0414)   sodium chloride Stopped (01/19/21 0013)    ceFAZolin (ANCEF) IV 2 g (01/26/21 1738)    (feeding supplement) PROSource Plus  30 mL Oral BID BM   acetaminophen  650 mg Oral Q6H   Chlorhexidine Gluconate Cloth  6 each Topical Q0600   Chlorhexidine Gluconate Cloth  6 each Topical Q0600   darbepoetin (ARANESP) injection - DIALYSIS  60 mcg Intravenous Q Fri-HD   doxercalciferol  4 mcg Intravenous Q M,W,F-HD   feeding supplement (NEPRO CARB STEADY)  237 mL Oral BID BM   heparin  2,200 Units Dialysis Once in dialysis   heparin injection (subcutaneous)  5,000 Units Subcutaneous Q8H   insulin aspart  3-9 Units Subcutaneous Q4H   lidocaine  1 patch Transdermal Q24H   midodrine  10 mg Oral TID WC   multivitamin  1 tablet Oral QHS   pantoprazole  40 mg Oral Daily   sucroferric oxyhydroxide  1,000 mg Oral TID WC  Dialysis Orders: MWF at [facility] 3h 87mn   400/500   68kg   2/2 bath  P4  AVF   - Heparin  2200  units IV TIW - Hectorol 4 mcg IV TIW   Assessment/Plan: 1. Septic Shock/MSSA bacteremia: Unclear source, AVF without signs of infection. TEE 6/10 negative for vegetations, however treating for 6 weeks with Cefazolin (through 7/20) per ID. 2. LUE edema/pain: LUE CT without abscess, then F'gram 6/15 which was negative. 3. C. Diff diarrhea: Finished course of Dificid, per ID. 4. ESRD: Continue HD per MWF schedule - next due on Monday 6/20. 5. BP/volume: BP stable - remains on midodrine. EDW will be lower on discharge.  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 7. Secondary hyperparathyroidism: CorrCa/Phos ok - continue Velphoro + VDRA. 8. Nutrition: Alb very low, continue supplements.     KaVeneta PentonPA-C 01/27/2021, 11:45 AM  [employer]

## 2021-01-28 ENCOUNTER — Inpatient Hospital Stay (HOSPITAL_COMMUNITY): Payer: Medicare HMO

## 2021-01-28 DIAGNOSIS — I330 Acute and subacute infective endocarditis: Secondary | ICD-10-CM

## 2021-01-28 LAB — GLUCOSE, CAPILLARY
Glucose-Capillary: 101 mg/dL — ABNORMAL HIGH (ref 70–99)
Glucose-Capillary: 143 mg/dL — ABNORMAL HIGH (ref 70–99)
Glucose-Capillary: 142 mg/dL — ABNORMAL HIGH (ref 70–99)
Glucose-Capillary: 103 mg/dL — ABNORMAL HIGH (ref 70–99)

## 2021-01-28 IMAGING — CT CT ABD-PELV W/ CM
2 of 5 series · 15 of 46 positions shown, 17 images · IV contrast (APPLIED)
Comparison: Chest CT [DATE], abdominopelvic CT [DATE]

CLINICAL DATA: Abdominal abscess/infection suspected Patient has
had severe refractory hiccups for 5 days now with recent
bacteremia,endocarditis. This is worrying for an intra-abdominal
infection

EXAM:
CT ABDOMEN AND PELVIS WITH CONTRAST
TECHNIQUE: Multidetector CT imaging of the abdomen and pelvis was performed
using the standard protocol following bolus administration of
intravenous contrast.
CONTRAST:  100mL OMNIPAQUE IOHEXOL 300 MG/ML  SOLN

[Series 3: abd/ pelvis 5.0 i30f 2 · axial · 0.90mm/px · z∈[+742,+1167]mm · 12 of 96 slices shown, 14 images]
[im 6/96  soft-tissue]
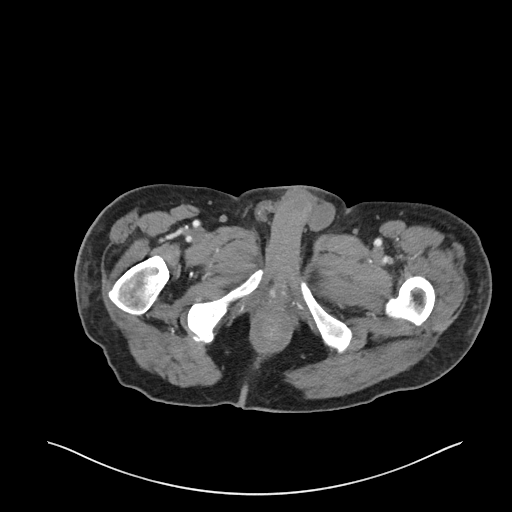
[im 6/96  bone]
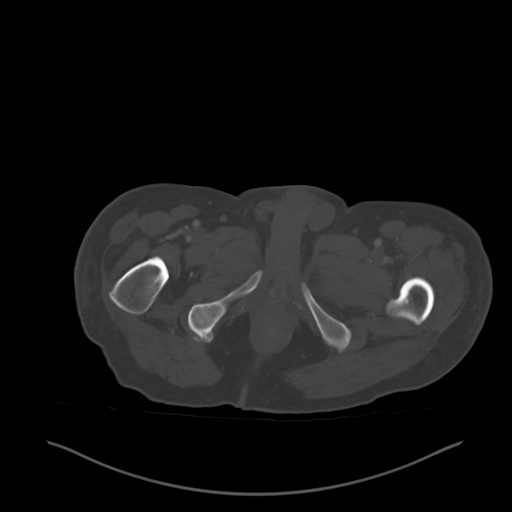
[im 16/96  soft-tissue]
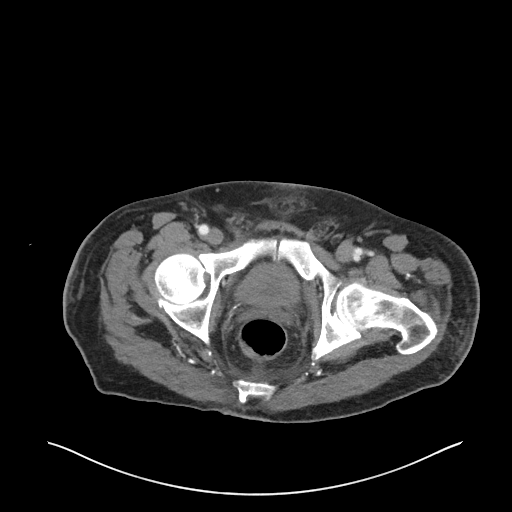
[im 21/96  soft-tissue]
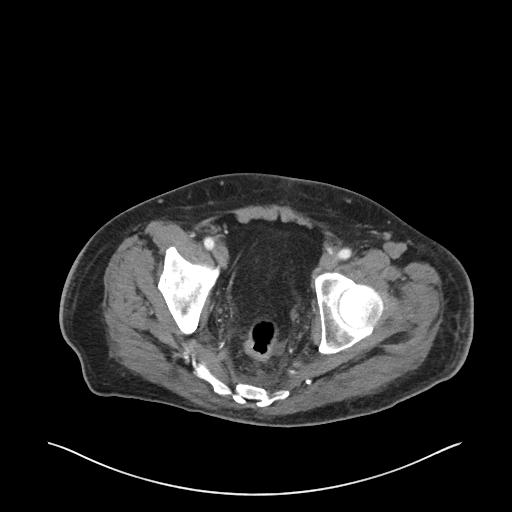
[im 31/96  soft-tissue]
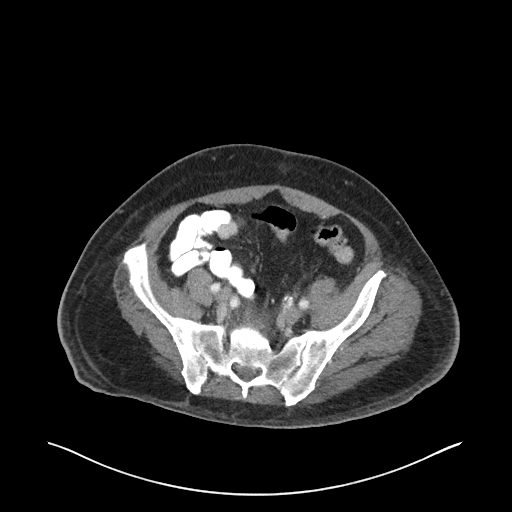
[im 36/96  soft-tissue]
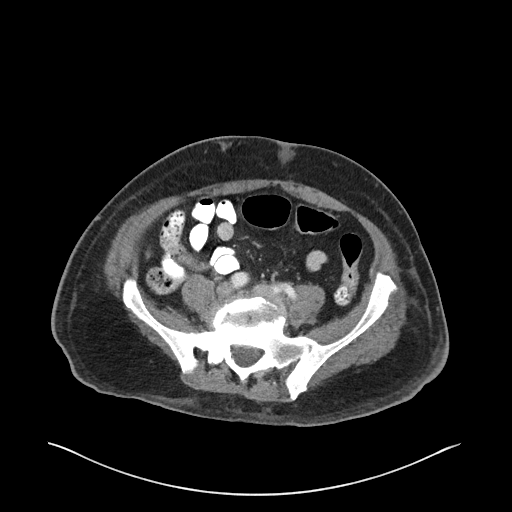
[im 46/96  soft-tissue]
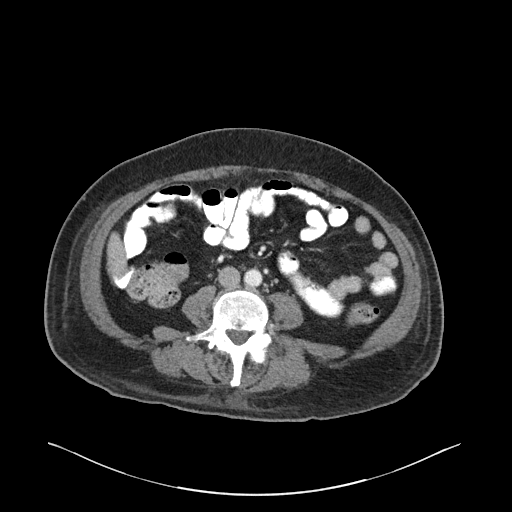
[im 51/96  soft-tissue]
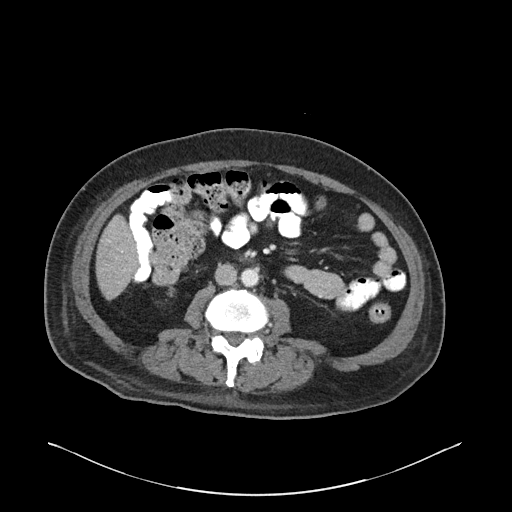
[im 61/96  soft-tissue]
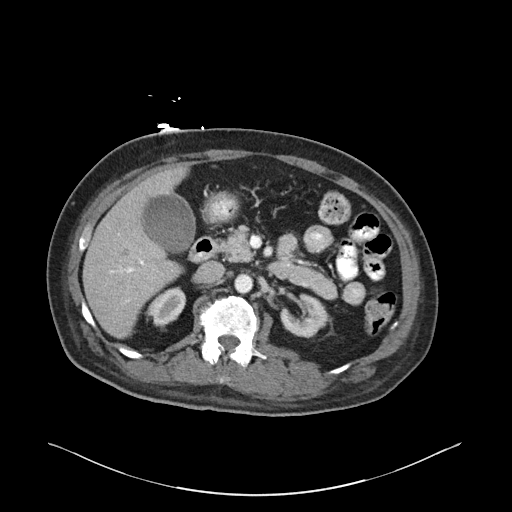
[im 66/96  soft-tissue]
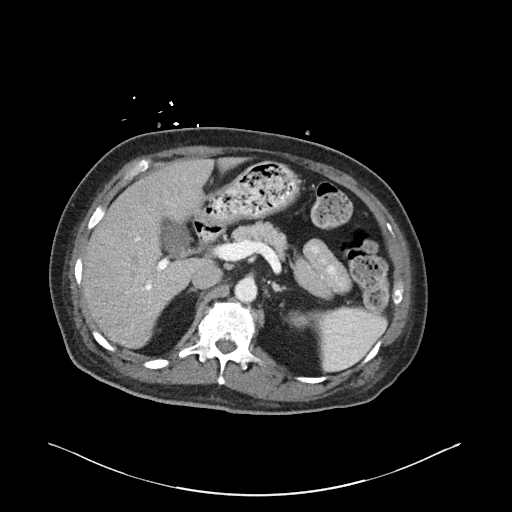
[im 66/96  bone]
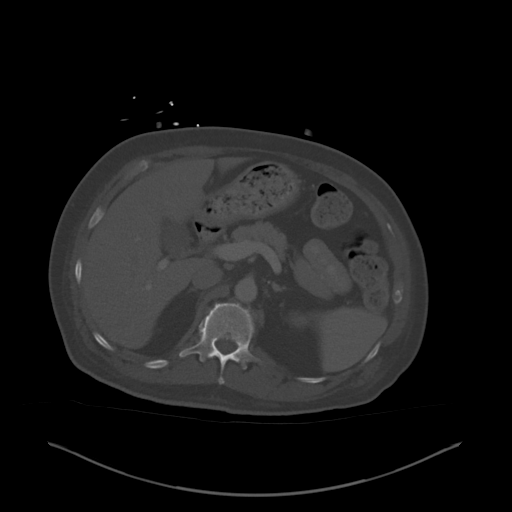
[im 76/96  soft-tissue]
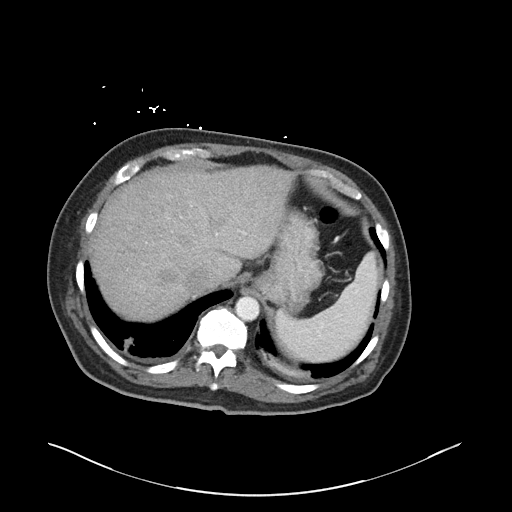
[im 81/96  soft-tissue]
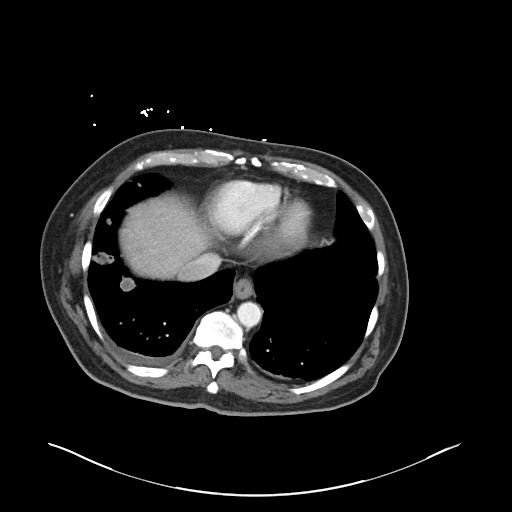
[im 91/96  soft-tissue]
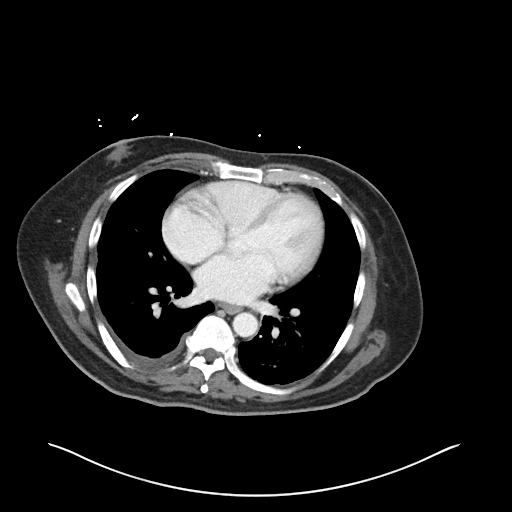

[Series 6: coronal soft tissue · coronal · 0.87mm/px · 3 of 89 slices shown]
[im 30/89  soft-tissue]
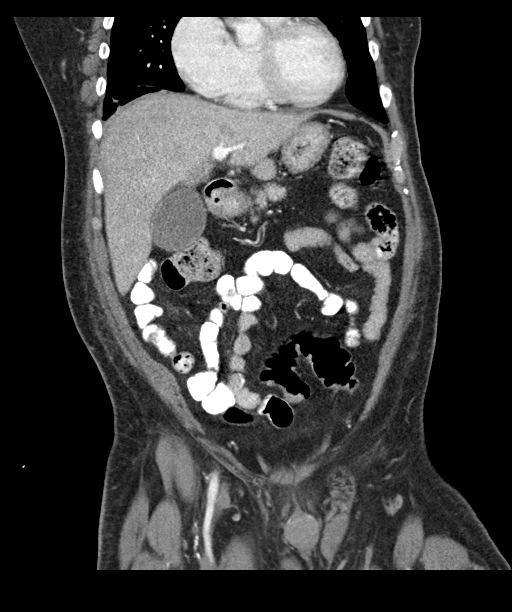
[im 40/89  soft-tissue]
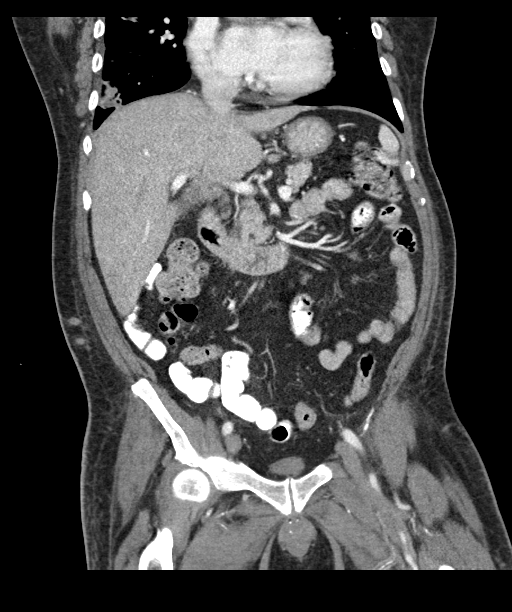
[im 49/89  soft-tissue]
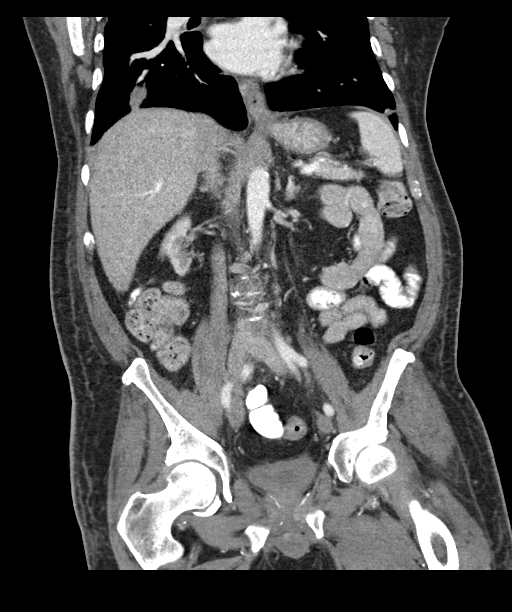

[15 of 46 positions shown; findings below may reference images not displayed]

FINDINGS: Lower chest: Cavitary nodules in the right lung base consistent with
septic emboli. These are coalescing and more well-defined than on
prior exam. Right middle lobe nodule measures 19 mm, series 5 image
1, abuts the diaphragm. There additional septic emboli in the right
lower lobe anteriorly that abut the diaphragm, series 5, image 16.
Heart is enlarged. Mild fluid in the distal esophagus. Small right
pleural effusion is similar. Trace left pleural effusion with
improvement.

Hepatobiliary: No focal hepatic lesion or hepatic abnormality. No
intrahepatic or subcapsular collection. Small calcified gallstone
within distended gallbladder but no pericholecystic inflammation. No
biliary dilatation.

Pancreas: No ductal dilatation or inflammation.

Spleen: Normal in size without focal abnormality.

Adrenals/Urinary Tract: Normal adrenal glands. Bilateral renal
parenchymal atrophy and areas of scarring. No hydronephrosis. No
renal fluid collection. Absent renal excretion on delayed phase
imaging. No renal calculi. Nondistended but thick walled urinary
bladder.

Stomach/Bowel: Minimal distal esophageal wall thickening.
Unremarkable stomach. Normal small bowel without obstruction or
inflammation. Normal appendix. Moderate volume of colonic stool.
There is no colonic wall thickening or pericolonic edema.

Vascular/Lymphatic: Aortic atherosclerosis without aneurysm. Patent
portal vein. No acute vascular findings. Small periportal and
retroperitoneal lymph nodes are not enlarged by size criteria.

Reproductive: Unremarkable prostate.

Other: Trace free fluid in the pelvis. There is presacral soft
tissue edema. No abdominopelvic fluid collection. Left testis is in
the inguinal canal. Occasional subcutaneous edema in the flanks.

Musculoskeletal: Lower lumbar degenerative change. No bony
destruction or CT findings of discitis osteomyelitis. Endplate
irregularity at L5-S1 with seen on prior exam, and not suspicious on
intervening lumbar MRI.
IMPRESSION: 1. Cavitary nodules in the right lung base consistent with septic
emboli. These are coalescing and more well-defined than on prior
exam. Right middle and lower lobe nodules about the diaphragm, may
be causing diaphragmatic irritation and cause of hiccups.
2. Small right pleural effusion is similar. Trace left pleural
effusion with improvement.
3. No intra-abdominopelvic findings of infection.
4. Cholelithiasis without gallbladder inflammation.
5. Presacral soft tissue edema is nonspecific, may simply be
reactive. Trace free fluid in the pelvis is new.
6. Left testis is in the inguinal canal.

Aortic Atherosclerosis ([O8]-[O8]).

## 2021-01-28 IMAGING — DX DG CHEST 1V PORT
1 series · 1 of 1 positions shown · non-contrast
Comparison: [DATE]

CLINICAL DATA: Bacteremia.

EXAM:
PORTABLE CHEST 1 VIEW

[chest ap]
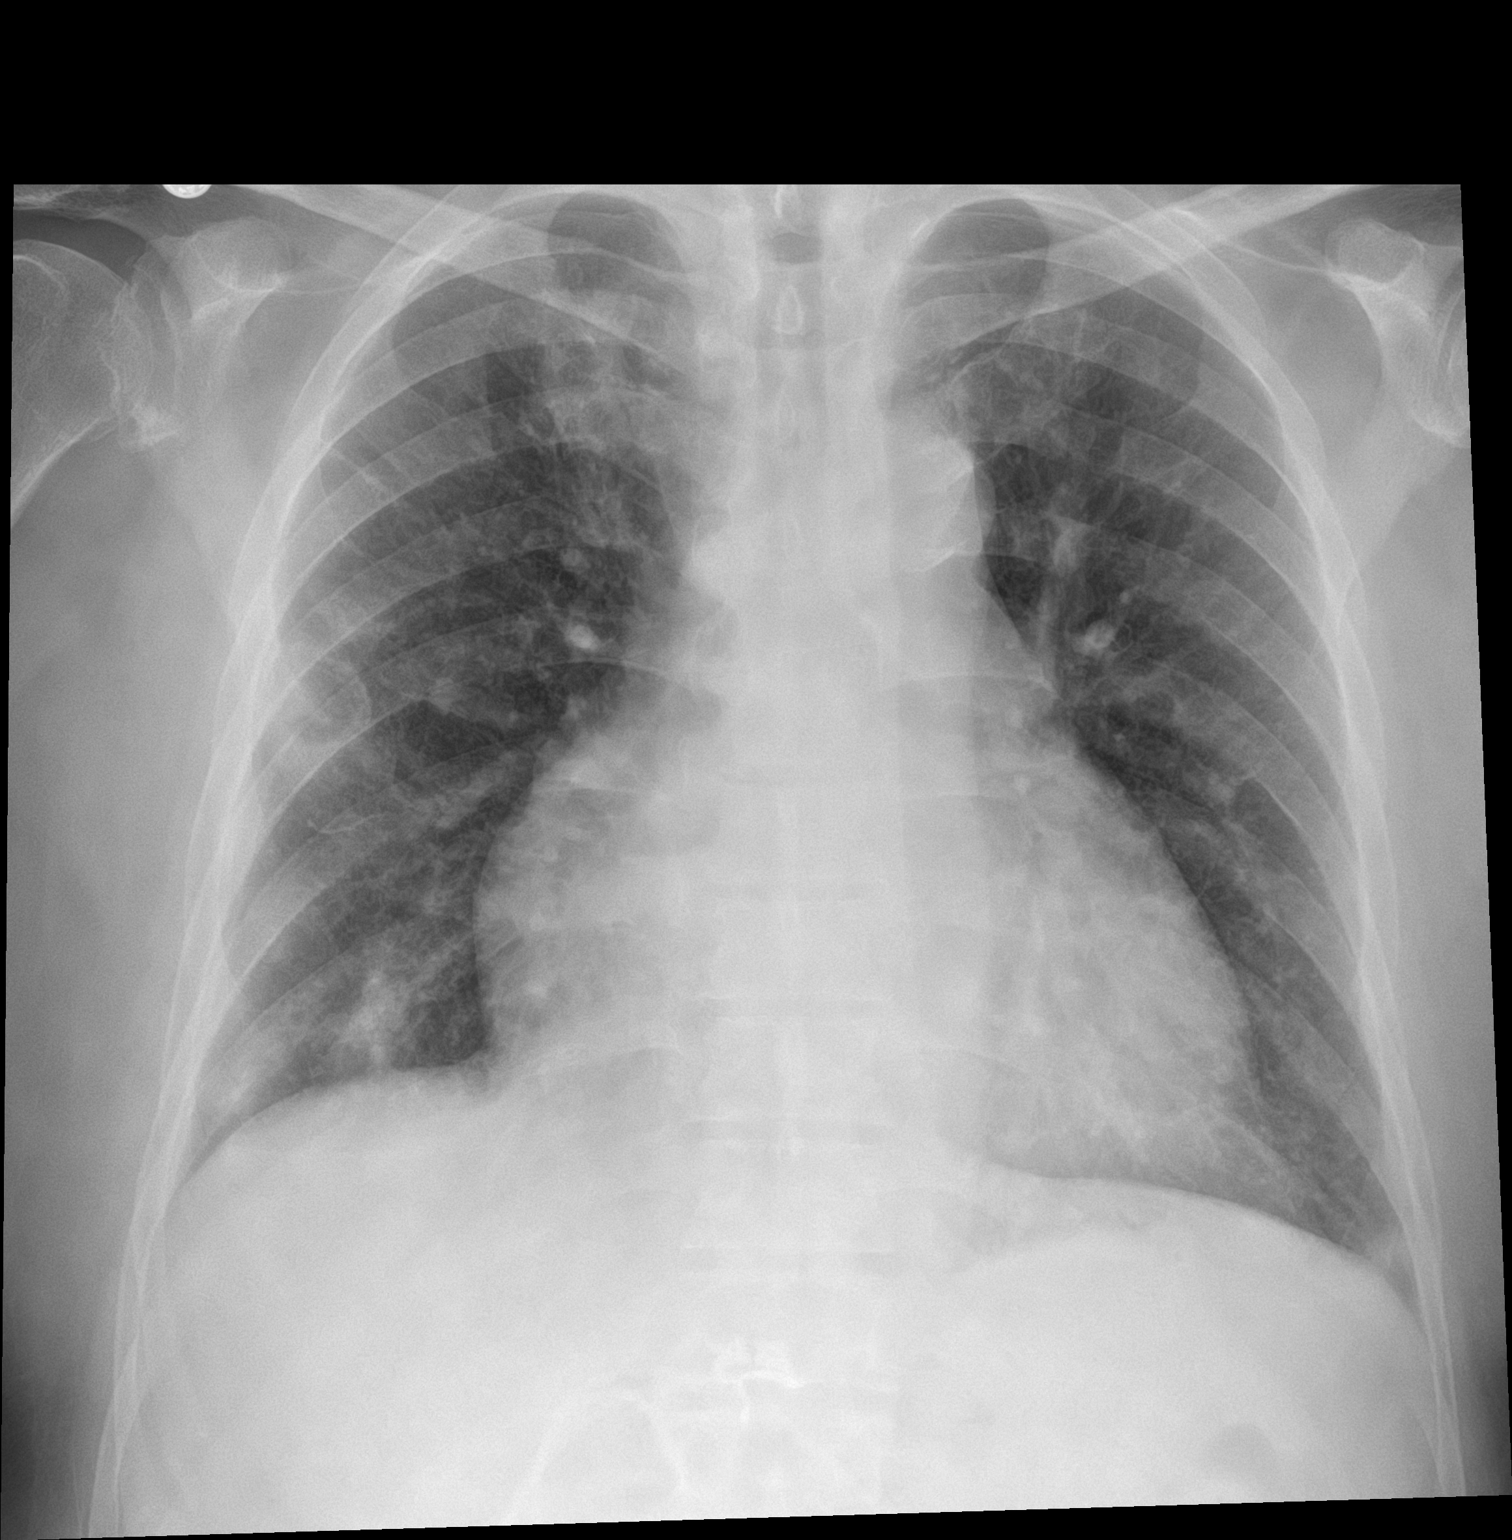

[1 of 1 positions shown; findings below may reference images not displayed]

FINDINGS: Enlarged cardiac silhouette.

There is no evidence of pleural effusion or pneumothorax. Numerous
peripheral some cavitating pulmonary nodules versus areas of
infiltrate, more prominent than on the prior radiograph.

Osseous structures are without acute abnormality. Soft tissues are
grossly normal.
IMPRESSION: Numerous peripheral some cavitating pulmonary nodules versus areas
of infiltrate, more prominent than on the prior radiograph, likely
representing involving septic emboli.

## 2021-01-28 MED ORDER — IOHEXOL 9 MG/ML PO SOLN
ORAL | Status: AC
  Administered 2021-01-28: 1000 mL
  Filled 2021-01-28: qty 1000

## 2021-01-28 MED ORDER — IOHEXOL 300 MG/ML  SOLN
100.0000 mL | Freq: Once | INTRAMUSCULAR | Status: AC | PRN
  Administered 2021-01-28: 100 mL via INTRAVENOUS

## 2021-01-28 NOTE — Progress Notes (Signed)
   Subjective:   Ryan Wells denies any acute complaints at this time.  He states his back pain is improving and at times resolved completely.  He is looking forward to going to a rehab facility and we discussed that placement can take several days.  Objective:  Vital signs in last 24 hours: Vitals:   01/27/21 1404 01/27/21 2054 01/28/21 0428 01/28/21 0430  BP: (!) 142/61 (!) 143/60  (!) 135/55  Pulse: 64 63  (!) 59  Resp: '16 16  16  '$ Temp: 98.9 F (37.2 C) 99.2 F (37.3 C)  98.5 F (36.9 C)  TempSrc: Oral     SpO2: 100% 98%  96%  Weight:   67.1 kg   Height:       Physical Exam Vitals and nursing note reviewed.  Constitutional:      General: He is not in acute distress.    Appearance: He is normal weight.     Comments: Hiccups every 1 min   HENT:     Head: Normocephalic and atraumatic.  Pulmonary:     Effort: Pulmonary effort is normal. No respiratory distress.  Skin:    General: Skin is warm and dry.  Neurological:     General: No focal deficit present.     Mental Status: He is alert and oriented to person, place, and time.  Psychiatric:        Mood and Affect: Mood normal.        Behavior: Behavior normal.   Assessment/Plan:  Principal Problem:   Bacterial endocarditis Active Problems:   MSSA bacteremia   ESRD needing dialysis ([place])   Septic shock (HCC)   C. difficile diarrhea  Ryan Wells is a 65 year old Spanish-speaking gentleman with a history of ESRD on HD, DM2, and hypertension who was admitted for nausea/vomiting with hospital course complicated by treatment in ICU for septic shock from MSSA bacteremia.   #MSSA bacteremia, endocarditis, pulmonary septic emboli #Septic shock, resolved No additional fevers noted. Leukocytosis is improving but continues to fluctuate. Given significant deconditioning during hospitalization with severe illness, PT/OT is recommending discharge to SNF for strength training.   - Continue Ancef, dosed MWF with  hemodialysis through 02/28/21 per ID - Awaiting SNF placement   #Persistent hiccups At this time, patient has not had any improvement with Protonix.  Symptoms have been approximately 3-4 days.  Given his history of recent endocarditis and septic pulmonary emboli, I am concerned that his hiccups are an indication that there is some abnormality irritating the diaphragm or phrenic nerve.  He may benefit from further imaging to evaluate for this.  - Continue Protonix '40mg'$  daily - Consider CT chest/abdomen for further evaluation, will likely need IV contrast    #ESRD on HD -Appreciate nephrology managing inpatient HD.  #Back Pain -Continue working with PT/OT -Tylenol scheduled -Oxycodone PRN   Code Status: FULL Diet: Renal/carb modified with fluid restriction  Fluids: None VTE ppx: subq Heparin  Prior to Admission Living Arrangement: Home Anticipated Discharge Location: SNF Barriers to Discharge: Placement to SNF  Dispo: Anticipated discharge in approximately 0-1 day(s).   Dr. Jose Persia Internal Medicine PGY-2  Pager: [phone] After 5pm on weekdays and 1pm on weekends: On Call pager [phone]  01/28/2021, 12:04 PM

## 2021-01-28 NOTE — Progress Notes (Signed)
[facility] Progress Note   Subjective:  Seen in room - eating breakfast. Denies CP or dyspnea. No diarrhea today.  Objective Vitals:   01/27/21 1404 01/27/21 2054 01/28/21 0428 01/28/21 0430  BP: (!) 142/61 (!) 143/60  (!) 135/55  Pulse: 64 63  (!) 59  Resp: '16 16  16  '$ Temp: 98.9 F (37.2 C) 99.2 F (37.3 C)  98.5 F (36.9 C)  TempSrc: Oral     SpO2: 100% 98%  96%  Weight:   67.1 kg   Height:       Physical Exam General: Well appearing man, NAD Heart: RRR; no murmur Lungs: CTA anteriorly Abdomen: soft, non-tender Extremities: No LE edema Dialysis Access: LUE AVF + thrill  Additional Objective Labs: Basic Metabolic Panel: Recent Labs  Lab 01/24/21 1920 01/26/21 0843 01/27/21 0204  NA 132* 131* 131*  K 3.3* 4.6 4.3  CL 95* 93* 93*  CO2 '27 27 28  '$ GLUCOSE 104* 167* 90  BUN 20 37* 14  CREATININE 3.85* 7.01* 4.06*  CALCIUM 7.3* 8.3* 8.6*  PHOS 3.1 6.7* 4.5   Liver Function Tests: Recent Labs  Lab 01/24/21 1920 01/26/21 0843 01/27/21 0204  ALBUMIN 2.1* 2.0* 2.0*   CBC: Recent Labs  Lab 01/23/21 0358 01/24/21 0123 01/24/21 1920 01/25/21 0129 01/26/21 0843 01/27/21 0204  WBC 10.9* 10.8* 11.0* 9.6 13.2* 10.6*  NEUTROABS 8.3* 8.2*  --   --   --  7.8*  HGB 9.3* 8.7* 8.6* 8.7* 8.6* 8.9*  HCT 29.3* 27.3* 27.5* 26.4* 26.7* 27.6*  MCV 98.7 97.5 99.3 96.7 98.2 98.2  PLT 306 301 344 303 346 311   Blood Culture    Component Value Date/Time   SDES BLOOD RIGHT HAND 01/19/2021 0446   SDES BLOOD RIGHT HAND 01/19/2021 0446   SPECREQUEST  01/19/2021 0446    BOTTLES DRAWN AEROBIC ONLY Blood Culture adequate volume   SPECREQUEST  01/19/2021 0446    BOTTLES DRAWN AEROBIC ONLY Blood Culture adequate volume   CULT  01/19/2021 0446    NO GROWTH 5 DAYS Performed at [facility], [place] [address], [place], [place] [address]    CULT  01/19/2021 0446    NO GROWTH 5 DAYS Performed at [facility], [place] [address], [place], [place]  [address]    REPTSTATUS 01/24/2021 FINAL 01/19/2021 0446   REPTSTATUS 01/24/2021 FINAL 01/19/2021 0446   Medications:  sodium chloride Stopped (01/17/21 0414)   sodium chloride Stopped (01/19/21 0013)    ceFAZolin (ANCEF) IV 2 g (01/26/21 1738)    (feeding supplement) PROSource Plus  30 mL Oral BID BM   acetaminophen  650 mg Oral Q6H   Chlorhexidine Gluconate Cloth  6 each Topical Q0600   Chlorhexidine Gluconate Cloth  6 each Topical Q0600   darbepoetin (ARANESP) injection - DIALYSIS  60 mcg Intravenous Q Fri-HD   doxercalciferol  4 mcg Intravenous Q M,W,F-HD   feeding supplement (NEPRO CARB STEADY)  237 mL Oral BID BM   heparin  2,200 Units Dialysis Once in dialysis   heparin injection (subcutaneous)  5,000 Units Subcutaneous Q8H   insulin aspart  0-15 Units Subcutaneous TID WC   lidocaine  1 patch Transdermal Q24H   midodrine  10 mg Oral TID WC   multivitamin  1 tablet Oral QHS   pantoprazole  40 mg Oral Daily   sucroferric oxyhydroxide  1,000 mg Oral TID WC    Dialysis Orders: MWF at [facility] 3h 65mn   400/500  68kg   2/2 bath  P4  AVF   - Heparin  2200  units IV TIW - Hectorol 4 mcg IV TIW   Assessment/Plan: 1. Septic Shock/MSSA bacteremia: Unclear source, AVF without signs of infection. TEE 6/10 negative for vegetations, however treating for 6 weeks with Cefazolin (through 7/20) per ID. 2. LUE edema/pain: LUE CT without abscess, then F'gram 6/15 which was negative. 3. C. Diff diarrhea: Finished course of Dificid, per ID. 4. ESRD: Continue HD per MWF schedule - next HD tomorrow (6/20) 5. BP/volume: BP stable - remains on midodrine. EDW will be lower on discharge. 6. Anemia of ESRD: Hgb 8.9 - Aranesp 53mg given 6/17, continue q Fri. 7. Secondary hyperparathyroidism: CorrCa/Phos ok - continue Velphoro + VDRA. 8. Nutrition: Alb very low, continue supplements.    KVeneta Penton PA-C 01/28/2021, 9:26 AM  [employer]

## 2021-01-28 NOTE — Plan of Care (Signed)
  Problem: Education: Goal: Knowledge of General Education information will improve Description: Including pain rating scale, medication(s)/side effects and non-pharmacologic comfort measures Outcome: Progressing   Problem: Health Behavior/Discharge Planning: Goal: Ability to manage health-related needs will improve Outcome: Progressing   Problem: Clinical Measurements: Goal: Ability to maintain clinical measurements within normal limits will improve Outcome: Progressing Goal: Will remain free from infection Outcome: Progressing   Problem: Pain Managment: Goal: General experience of comfort will improve Outcome: Progressing   Problem: Safety: Goal: Ability to remain free from injury will improve Outcome: Progressing   Problem: Skin Integrity: Goal: Risk for impaired skin integrity will decrease Outcome: Progressing

## 2021-01-29 LAB — BASIC METABOLIC PANEL
Anion gap: 13 (ref 5–15)
BUN: 63 mg/dL — ABNORMAL HIGH (ref 8–23)
CO2: 24 mmol/L (ref 22–32)
Calcium: 8.8 mg/dL — ABNORMAL LOW (ref 8.9–10.3)
Chloride: 86 mmol/L — ABNORMAL LOW (ref 98–111)
Creatinine, Ser: 8.83 mg/dL — ABNORMAL HIGH (ref 0.61–1.24)
GFR, Estimated: 6 mL/min — ABNORMAL LOW (ref >60)
Glucose, Bld: 137 mg/dL — ABNORMAL HIGH (ref 70–99)
Potassium: 5.6 mmol/L — ABNORMAL HIGH (ref 3.5–5.1)
Sodium: 123 mmol/L — ABNORMAL LOW (ref 135–145)

## 2021-01-29 LAB — GLUCOSE, CAPILLARY
Glucose-Capillary: 86 mg/dL (ref 70–99)
Glucose-Capillary: 128 mg/dL — ABNORMAL HIGH (ref 70–99)
Glucose-Capillary: 126 mg/dL — ABNORMAL HIGH (ref 70–99)

## 2021-01-29 MED ORDER — POLYETHYLENE GLYCOL 3350 17 G PO PACK
17.0000 g | PACK | Freq: Two times a day (BID) | ORAL | Status: DC
  Administered 2021-01-29 – 2021-01-30 (×2): 17 g via ORAL
  Filled 2021-01-29 (×3): qty 1

## 2021-01-29 MED ORDER — MUSCLE RUB 10-15 % EX CREA
TOPICAL_CREAM | CUTANEOUS | Status: DC | PRN
  Filled 2021-01-29: qty 85

## 2021-01-29 NOTE — Care Management (Addendum)
Patient has no SNF bed offers.   NCM called : [facility] and left message.  Spoke to Ray at [facility] , she will review and call NCM back. [facility] declined.  [facility] and left a message.  Spoke to Star at [facility] she will review referral and call NCM back. [facility] can offer , however, patient curent dialysis is at [facility] chair time 6:05 am arrive at 05:50 am. [facility] cannot transport that early, their drivers start at [id number] am. Welford Roche dialysis coordinator , [facility] does not have anything later on a M-W-F schedule , but does hae T-T-S chair time at noon arrive by 11:40 am. Star at [facility] aware and will check to see if they can provide transportation. Patient is not managed by Navi. Called Navi spoke to [place] and confirmed.   Left message for Loma Boston at [facility]  Left message for Olivia Mackie at [facility].  Left message at [facility].  Juliann Pulse at [facility] will check with administrator to see if she could offer a bed, if so [place] access would need to be arranged.   [facility] declined.  [facility] declined.  [facility] declined.

## 2021-01-29 NOTE — Progress Notes (Signed)
Navigator received call from TOC CM/H. Wile who states patient has a bed offer at [facility], but they cannot transport on his current time at [facility]/MWF 6:05am (5:45am arrival). Navigator contacted [place] manager currently covering [facility], who states she does not have any other availability on MWF, but can offer a TTS at 12:00pm (11:40am arrival). Navigator informed CM, who awaits update from [facility] on whether they can accept/transport on this schedule. Navigator will follow.     Alphonzo Cruise, [place] [facility]al Navigator [phone]

## 2021-01-29 NOTE — Progress Notes (Signed)
HD#14 Subjective:  Overnight Events: None  Ryan Wells is sitting in bed enjoying his breakfast.  He continues to endorse left-sided shoulder pain as well as back and lower extremity pain.  States this is chronic and he has good days and bad days.  He has no other acute concerns.  Discussed his CT abdomen pelvis findings of multiple abscesses thought to be secondary to his bacteremia.  This was thought to be contributing to his hiccups, discussed that we hope that these will improve with antibiotic therapy.  He has no other acute concerns or questions at this time.  Spanish translator services was used during this encounter to assist in communicating with the patient  Objective:  Vital signs in last 24 hours: Vitals:   01/28/21 1514 01/28/21 2057 01/29/21 0456 01/29/21 1337  BP: (!) 147/64 (!) 145/59 (!) 150/67 (!) 148/56  Pulse: 61 (!) 59 65 61  Resp: '18 17 20 17  '$ Temp: 99 F (37.2 C) 98.2 F (36.8 C) 98.3 F (36.8 C) 98.8 F (37.1 C)  TempSrc: Oral Oral  Oral  SpO2: 99% 100% 100% 100%  Weight:   67.2 kg   Height:       Supplemental O2: Room Air, saturating well  Physical Exam:  Constitutional: Sitting upright in bed, no acute distress HENT: normocephalic atraumatic Eyes: conjunctiva non-erythematous Neck: supple Cardiovascular: regular rate and rhythm, no m/r/g Pulmonary/Chest: normal work of breathing on room air, lungs clear to auscultation bilaterally Abdominal: soft, non-tender, non-distended MSK: normal bulk and tone.  Left fistula with palpable thrill Neurological: alert & oriented x 3 Skin: warm and dry Psych: Normal mood and thought process  Filed Weights   01/27/21 0412 01/28/21 0428 01/29/21 0456  Weight: 67.4 kg 67.1 kg 67.2 kg     Intake/Output Summary (Last 24 hours) at 01/29/2021 1433 Last data filed at 01/29/2021 0930 Gross per 24 hour  Intake 100 ml  Output --  Net 100 ml   Net IO Since Admission: 6.98 mL [01/29/21 1433]  Pertinent  Labs: CBC Latest Ref Rng & Units 01/27/2021 01/26/2021 01/25/2021  WBC 4.0 - 10.5 K/uL 10.6(H) 13.2(H) 9.6  Hemoglobin 13.0 - 17.0 g/dL 8.9(L) 8.6(L) 8.7(L)  Hematocrit 39.0 - 52.0 % 27.6(L) 26.7(L) 26.4(L)  Platelets 150 - 400 K/uL 311 346 303    CMP Latest Ref Rng & Units 01/27/2021 01/26/2021 01/24/2021  Glucose 70 - 99 mg/dL 90 167(H) 104(H)  BUN 8 - 23 mg/dL 14 37(H) 20  Creatinine 0.61 - 1.24 mg/dL 4.06(H) 7.01(H) 3.85(H)  Sodium 135 - 145 mmol/L 131(L) 131(L) 132(L)  Potassium 3.5 - 5.1 mmol/L 4.3 4.6 3.3(L)  Chloride 98 - 111 mmol/L 93(L) 93(L) 95(L)  CO2 22 - 32 mmol/L '28 27 27  '$ Calcium 8.9 - 10.3 mg/dL 8.6(L) 8.3(L) 7.3(L)  Total Protein 6.5 - 8.1 g/dL - - -  Total Bilirubin 0.3 - 1.2 mg/dL - - -  Alkaline Phos 38 - 126 U/L - - -  AST 15 - 41 U/L - - -  ALT 0 - 44 U/L - - -    Imaging: CT ABDOMEN PELVIS W CONTRAST  Result Date: 01/28/2021 CLINICAL DATA:  Abdominal abscess/infection suspected Patient has had severe refractory hiccups for 5 days now with recent bacteremia,endocarditis. This is worrying for an intra-abdominal infection EXAM: CT ABDOMEN AND PELVIS WITH CONTRAST TECHNIQUE: Multidetector CT imaging of the abdomen and pelvis was performed using the standard protocol following bolus administration of intravenous contrast. CONTRAST:  122m OMNIPAQUE  IOHEXOL 300 MG/ML  SOLN COMPARISON:  Chest CT 01/17/2021, abdominopelvic CT 01/15/2021 FINDINGS: Lower chest: Cavitary nodules in the right lung base consistent with septic emboli. These are coalescing and more well-defined than on prior exam. Right middle lobe nodule measures 19 mm, series 5 image 1, abuts the diaphragm. There additional septic emboli in the right lower lobe anteriorly that abut the diaphragm, series 5, image 16. Heart is enlarged. Mild fluid in the distal esophagus. Small right pleural effusion is similar. Trace left pleural effusion with improvement. Hepatobiliary: No focal hepatic lesion or hepatic abnormality.  No intrahepatic or subcapsular collection. Small calcified gallstone within distended gallbladder but no pericholecystic inflammation. No biliary dilatation. Pancreas: No ductal dilatation or inflammation. Spleen: Normal in size without focal abnormality. Adrenals/Urinary Tract: Normal adrenal glands. Bilateral renal parenchymal atrophy and areas of scarring. No hydronephrosis. No renal fluid collection. Absent renal excretion on delayed phase imaging. No renal calculi. Nondistended but thick walled urinary bladder. Stomach/Bowel: Minimal distal esophageal wall thickening. Unremarkable stomach. Normal small bowel without obstruction or inflammation. Normal appendix. Moderate volume of colonic stool. There is no colonic wall thickening or pericolonic edema. Vascular/Lymphatic: Aortic atherosclerosis without aneurysm. Patent portal vein. No acute vascular findings. Small periportal and retroperitoneal lymph nodes are not enlarged by size criteria. Reproductive: Unremarkable prostate. Other: Trace free fluid in the pelvis. There is presacral soft tissue edema. No abdominopelvic fluid collection. Left testis is in the inguinal canal. Occasional subcutaneous edema in the flanks. Musculoskeletal: Lower lumbar degenerative change. No bony destruction or CT findings of discitis osteomyelitis. Endplate irregularity at L5-S1 with seen on prior exam, and not suspicious on intervening lumbar MRI. IMPRESSION: 1. Cavitary nodules in the right lung base consistent with septic emboli. These are coalescing and more well-defined than on prior exam. Right middle and lower lobe nodules about the diaphragm, may be causing diaphragmatic irritation and cause of hiccups. 2. Small right pleural effusion is similar. Trace left pleural effusion with improvement. 3. No intra-abdominopelvic findings of infection. 4. Cholelithiasis without gallbladder inflammation. 5. Presacral soft tissue edema is nonspecific, may simply be reactive. Trace free  fluid in the pelvis is new. 6. Left testis is in the inguinal canal. Aortic Atherosclerosis (ICD10-I70.0). Electronically Signed   By: Keith Rake M.D.   On: 01/28/2021 20:21   DG CHEST PORT 1 VIEW  Result Date: 01/28/2021 CLINICAL DATA:  Bacteremia. EXAM: PORTABLE CHEST 1 VIEW COMPARISON:  January 15, 2021 FINDINGS: Enlarged cardiac silhouette. There is no evidence of pleural effusion or pneumothorax. Numerous peripheral some cavitating pulmonary nodules versus areas of infiltrate, more prominent than on the prior radiograph. Osseous structures are without acute abnormality. Soft tissues are grossly normal. IMPRESSION: Numerous peripheral some cavitating pulmonary nodules versus areas of infiltrate, more prominent than on the prior radiograph, likely representing involving septic emboli. Electronically Signed   By: Fidela Salisbury M.D.   On: 01/28/2021 16:16    Assessment/Plan:   Principal Problem:   Bacterial endocarditis Active Problems:   MSSA bacteremia   ESRD needing dialysis ([place])   Septic shock (HCC)   C. difficile diarrhea   Patient Summary: Ryan Wells is a 65 year old Spanish-speaking gentleman with a history of ESRD on HD, DM2, and hypertension who was admitted for nausea/vomiting with hospital course complicated by treatment in ICU for septic shock from MSSA bacteremia.   MSSA bacteremia, endocarditis, pulmonary septic emboli Septic shock, resolved No fevers overnight.  Patient with overall improvement in his general appearance.  Continuing on antibiotics during dialysis  sessions. - Continue Ancef, dosed MWF with hemodialysis through 02/28/21 per ID - Awaiting SNF placement   Persistent hiccups At this time, patient has not had any improvement with Protonix.  CT abdomen pelvis performed yesterday and patient was found to have cavitary nodules in the right lung base consistent with septic emboli that may be causing diaphragmatic irritation leading to persistent  hiccups.  Suspect that this will improve with IV antibiotics. - Continue Protonix '40mg'$  daily  ESRD on HD -Appreciate nephrology managing inpatient HD.  Dialysis today.   Back Pain Lower extremity pain Left shoulder pain Patient continues to endorse chronic back pain, lower extremity pain and left shoulder pain.  States that his pain does improve with physical therapy and exercise.  We will add on Bengay continue lidocaine patch as well as medications below. -Continue working with PT/OT -Lidocaine patches, add Bengay -Tylenol scheduled -Oxycodone PRN  Diet: Renal/carb modified IVF: None,None VTE: Heparin Code: Full PT/OT recs: SNF for Subacute PT, tub/shower bench, 3 and 1 bedside commode  Dispo: Anticipated discharge to Skilled nursing facility pending acceptance to a facility  Reached out to social worker working on patient's placement who notes that currently having difficult time with SNF placement for dialysis patients.  They will continue to reach out to facilities.  Sanjuana Letters DO Internal Medicine Resident PGY-1 Pager [phone] Please contact the on call pager after 5 pm and on weekends at [phone].

## 2021-01-29 NOTE — Progress Notes (Deleted)
Back and feet still hurt, but somewhat improved. Left shoulder hurts from time to time, but not as bad.   Shoulder pain started after starting dialysis. Hurts when he gets dialysis and goes away following day. Physical therapy does help with this along with the pain in his back.   Discussed that imaging showed abscesses around diaphragm which may be causing his hiccups. Discussed that antibiotics will help treat abscess and may help decrease hiccups.   Has dialysis today.

## 2021-01-29 NOTE — Progress Notes (Signed)
[facility] Progress Note   Subjective:     Patient seen and examined today at bedside. Seen eating breakfast-appears comfortable. Denies pain, SOB, and CP. Scheduled for HD today.   Objective Vitals:   01/28/21 0430 01/28/21 1514 01/28/21 2057 01/29/21 0456  BP: (!) 135/55 (!) 147/64 (!) 145/59 (!) 150/67  Pulse: (!) 59 61 (!) 59 65  Resp: '16 18 17 20  '$ Temp: 98.5 F (36.9 C) 99 F (37.2 C) 98.2 F (36.8 C) 98.3 F (36.8 C)  TempSrc:  Oral Oral   SpO2: 96% 99% 100% 100%  Weight:    67.2 kg  Height:       Physical Exam General: Well developed male; No acute respiratory distress Heart: Normal S1 and S2; No murmur, gallops, or rubs Lungs: Clear throughout; No wheezing, rales, or rhonchi Abdomen: Soft, non-tender, active bowel sounds Extremities: No edema bilateral lower extremities Dialysis Access: L AVF (+) Bruit/Thrill   Filed Weights   01/27/21 0412 01/28/21 0428 01/29/21 0456  Weight: 67.4 kg 67.1 kg 67.2 kg    Intake/Output Summary (Last 24 hours) at 01/29/2021 0930 Last data filed at 01/28/2021 1307 Gross per 24 hour  Intake 240 ml  Output --  Net 240 ml    Additional Objective Labs: Basic Metabolic Panel: Recent Labs  Lab 01/24/21 1920 01/26/21 0843 01/27/21 0204  NA 132* 131* 131*  K 3.3* 4.6 4.3  CL 95* 93* 93*  CO2 '27 27 28  '$ GLUCOSE 104* 167* 90  BUN 20 37* 14  CREATININE 3.85* 7.01* 4.06*  CALCIUM 7.3* 8.3* 8.6*  PHOS 3.1 6.7* 4.5   Liver Function Tests: Recent Labs  Lab 01/24/21 1920 01/26/21 0843 01/27/21 0204  ALBUMIN 2.1* 2.0* 2.0*   No results for input(s): LIPASE, AMYLASE in the last 168 hours. CBC: Recent Labs  Lab 01/23/21 0358 01/24/21 0123 01/24/21 1920 01/25/21 0129 01/26/21 0843 01/27/21 0204  WBC 10.9* 10.8* 11.0* 9.6 13.2* 10.6*  NEUTROABS 8.3* 8.2*  --   --   --  7.8*  HGB 9.3* 8.7* 8.6* 8.7* 8.6* 8.9*  HCT 29.3* 27.3* 27.5* 26.4* 26.7* 27.6*  MCV 98.7 97.5 99.3 96.7 98.2 98.2  PLT 306 301 344  303 346 311   Blood Culture    Component Value Date/Time   SDES BLOOD RIGHT HAND 01/19/2021 0446   SDES BLOOD RIGHT HAND 01/19/2021 0446   SPECREQUEST  01/19/2021 0446    BOTTLES DRAWN AEROBIC ONLY Blood Culture adequate volume   SPECREQUEST  01/19/2021 0446    BOTTLES DRAWN AEROBIC ONLY Blood Culture adequate volume   CULT  01/19/2021 0446    NO GROWTH 5 DAYS Performed at [facility], [place] [address] [place], [place] [address]    CULT  01/19/2021 0446    NO GROWTH 5 DAYS Performed at [facility], [place] [address], [place], [place] [address]    REPTSTATUS 01/24/2021 FINAL 01/19/2021 0446   REPTSTATUS 01/24/2021 FINAL 01/19/2021 0446    Cardiac Enzymes: No results for input(s): CKTOTAL, CKMB, CKMBINDEX, TROPONINI in the last 168 hours. CBG: Recent Labs  Lab 01/28/21 0748 01/28/21 1151 01/28/21 1702 01/28/21 2100 01/29/21 0754  GLUCAP 101* 143* 142* 103* 86   Iron Studies: No results for input(s): IRON, TIBC, TRANSFERRIN, FERRITIN in the last 72 hours. No results found for: INR, PROTIME Studies/Results: CT ABDOMEN PELVIS W CONTRAST  Result Date: 01/28/2021 CLINICAL DATA:  Abdominal abscess/infection suspected Patient has had severe refractory hiccups for 5 days now with  recent bacteremia,endocarditis. This is worrying for an intra-abdominal infection EXAM: CT ABDOMEN AND PELVIS WITH CONTRAST TECHNIQUE: Multidetector CT imaging of the abdomen and pelvis was performed using the standard protocol following bolus administration of intravenous contrast. CONTRAST:  16m OMNIPAQUE IOHEXOL 300 MG/ML  SOLN COMPARISON:  Chest CT 01/17/2021, abdominopelvic CT 01/15/2021 FINDINGS: Lower chest: Cavitary nodules in the right lung base consistent with septic emboli. These are coalescing and more well-defined than on prior exam. Right middle lobe nodule measures 19 mm, series 5 image 1, abuts the diaphragm. There additional septic emboli in the right lower lobe anteriorly that abut  the diaphragm, series 5, image 16. Heart is enlarged. Mild fluid in the distal esophagus. Small right pleural effusion is similar. Trace left pleural effusion with improvement. Hepatobiliary: No focal hepatic lesion or hepatic abnormality. No intrahepatic or subcapsular collection. Small calcified gallstone within distended gallbladder but no pericholecystic inflammation. No biliary dilatation. Pancreas: No ductal dilatation or inflammation. Spleen: Normal in size without focal abnormality. Adrenals/Urinary Tract: Normal adrenal glands. Bilateral renal parenchymal atrophy and areas of scarring. No hydronephrosis. No renal fluid collection. Absent renal excretion on delayed phase imaging. No renal calculi. Nondistended but thick walled urinary bladder. Stomach/Bowel: Minimal distal esophageal wall thickening. Unremarkable stomach. Normal small bowel without obstruction or inflammation. Normal appendix. Moderate volume of colonic stool. There is no colonic wall thickening or pericolonic edema. Vascular/Lymphatic: Aortic atherosclerosis without aneurysm. Patent portal vein. No acute vascular findings. Small periportal and retroperitoneal lymph nodes are not enlarged by size criteria. Reproductive: Unremarkable prostate. Other: Trace free fluid in the pelvis. There is presacral soft tissue edema. No abdominopelvic fluid collection. Left testis is in the inguinal canal. Occasional subcutaneous edema in the flanks. Musculoskeletal: Lower lumbar degenerative change. No bony destruction or CT findings of discitis osteomyelitis. Endplate irregularity at L5-S1 with seen on prior exam, and not suspicious on intervening lumbar MRI. IMPRESSION: 1. Cavitary nodules in the right lung base consistent with septic emboli. These are coalescing and more well-defined than on prior exam. Right middle and lower lobe nodules about the diaphragm, may be causing diaphragmatic irritation and cause of hiccups. 2. Small right pleural effusion  is similar. Trace left pleural effusion with improvement. 3. No intra-abdominopelvic findings of infection. 4. Cholelithiasis without gallbladder inflammation. 5. Presacral soft tissue edema is nonspecific, may simply be reactive. Trace free fluid in the pelvis is new. 6. Left testis is in the inguinal canal. Aortic Atherosclerosis (ICD10-I70.0). Electronically Signed   By: MKeith RakeM.D.   On: 01/28/2021 20:21   DG CHEST PORT 1 VIEW  Result Date: 01/28/2021 CLINICAL DATA:  Bacteremia. EXAM: PORTABLE CHEST 1 VIEW COMPARISON:  January 15, 2021 FINDINGS: Enlarged cardiac silhouette. There is no evidence of pleural effusion or pneumothorax. Numerous peripheral some cavitating pulmonary nodules versus areas of infiltrate, more prominent than on the prior radiograph. Osseous structures are without acute abnormality. Soft tissues are grossly normal. IMPRESSION: Numerous peripheral some cavitating pulmonary nodules versus areas of infiltrate, more prominent than on the prior radiograph, likely representing involving septic emboli. Electronically Signed   By: DFidela SalisburyM.D.   On: 01/28/2021 16:16    Medications:  sodium chloride Stopped (01/17/21 0414)   sodium chloride Stopped (01/19/21 0013)    ceFAZolin (ANCEF) IV 2 g (01/26/21 1738)    (feeding supplement) PROSource Plus  30 mL Oral BID BM   acetaminophen  650 mg Oral Q6H   Chlorhexidine Gluconate Cloth  6 each Topical Q0600   Chlorhexidine Gluconate Cloth  6 each Topical Q0600   darbepoetin (ARANESP) injection - DIALYSIS  60 mcg Intravenous Q Fri-HD   doxercalciferol  4 mcg Intravenous Q M,W,F-HD   feeding supplement (NEPRO CARB STEADY)  237 mL Oral BID BM   heparin  2,200 Units Dialysis Once in dialysis   heparin injection (subcutaneous)  5,000 Units Subcutaneous Q8H   insulin aspart  0-15 Units Subcutaneous TID WC   lidocaine  1 patch Transdermal Q24H   midodrine  10 mg Oral TID WC   multivitamin  1 tablet Oral QHS   pantoprazole   40 mg Oral Daily   polyethylene glycol  17 g Oral BID   sucroferric oxyhydroxide  1,000 mg Oral TID WC    Dialysis Orders: MWF at [facility] 3h 68mn   400/500   68kg   2/2 bath  P4  AVF   - Heparin  2200  units IV TIW - Hectorol 4 mcg IV TIW  Assessment/Plan: 1. Septic Shock/MSSA bacteremia: Unclear source, AVF without signs of infection. TEE 6/10 negative for vegetations, however treating for 6 weeks with Cefazolin (through 7/20) per ID. 2. LUE edema/pain: LUE CT without abscess, then F'gram 6/15 which was negative. 3. C. Diff diarrhea: Finished course of Dificid, per ID. 4. ESRD: Continue HD per MWF schedule - HD scheduled for today 01/29/21-UFG 2L as tolerated. 5. BP/volume: BP stable - remains on midodrine. EDW will be lower on discharge. 6. Anemia of ESRD: Hgb 8.9 - Aranesp 68m given 6/17, continue q Fri. 7. Secondary hyperparathyroidism: CorrCa/Phos ok - continue Velphoro + VDRA. 8. Nutrition: Alb very low, continue supplements.     CoTobie PoetNP [facility] 01/29/2021,9:30 AM  LOS: 14 days

## 2021-01-29 NOTE — Progress Notes (Signed)
Occupational Therapy Treatment Patient Details Name: Ryan Wells MRN: [id number] DOB: [birth year] Today's Date: 01/29/2021    History of present illness 65 yo male wiht onset of N&V was admitted, had diarrhea and had to start in ICU.  Pt was dehydrated, had low Na, in lactic acidosis with fever, hypotensive.  PNA found, had MSSA bacteremia from unknown cause with septic shock.  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 Fistula cleared on 6/15 of occlusion. PMHx:  ESRD, HTN, DM.   OT comments  [name] is progressing well with increased tolerance for functional mobility today. Pt with some reports of dizziness after transfer from sit>stand, however did not experience an othostatic drop in BP; and had improved symptoms after short ambulation. Overall pt is ligth min A/ min guard for room distance ambulation with rw given cues for safety. Pt completed 2 steps (the amount to enter his home) with R hand rail and +2 assistance for safety. Pt is unable to reach his feet, discussed option of reacher and sock aide to increased indep. Pt continues to benefit from OT to progress function. D/c plan remains appropriate - however, if pt continues to progress as well as he did today, d/c plan may be updated to home with [place].    Follow Up Recommendations  SNF;Supervision/Assistance - 24 hour    Equipment Recommendations  Tub/shower bench;Other (comment) (reacher, sock aide)       Precautions / Restrictions Precautions Precautions: Fall Precaution Comments: monitor vitals, light headed with mobility Restrictions Weight Bearing Restrictions: No       Mobility Bed Mobility Overal bed mobility: Needs Assistance Bed Mobility: Supine to Sit     Supine to sit: Min assist;HOB elevated     General bed mobility comments: Increased time to come to sitting and min A with elevating trunk    Transfers Overall transfer level: Needs assistance Equipment used: Rolling walker (2  wheeled) Transfers: Sit to/from Stand Sit to Stand: Min guard         General transfer comment: Min guard for safety, no physical assistance given.    Balance Overall balance assessment: Needs assistance;History of Falls Sitting-balance support: No upper extremity supported;Feet supported Sitting balance-Leahy Scale: Fair Sitting balance - Comments: Pt had 1 LOB when scooting his hips forward on bed   Standing balance support: Bilateral upper extremity supported Standing balance-Leahy Scale: Fair           ADL either performed or assessed with clinical judgement   ADL Overall ADL's : Needs assistance/impaired                     Lower Body Dressing: Moderate assistance;Sit to/from stand Lower Body Dressing Details (indicate cue type and reason): pt requried assist to don bilat socks and thread pants/underwear Toilet Transfer: Min Marine scientist Details (indicate cue type and reason): simulated to chair - pt's son reports pt has been ambulating with RW to hospital bathroom and completing toilet transfer with supervision         Functional mobility during ADLs: Min guard;+2 for safety/equipment;Rolling walker General ADL Comments: Pt would benefit from LB AE to incrase indep     Cognition Arousal/Alertness: Awake/alert Behavior During Therapy: WFL for tasks assessed/performed Overall Cognitive Status: Within Functional Limits for tasks assessed                 Exercises Exercises: Other exercises Other Exercises Other Exercises: Seated marching, BLE, 10 reps Other Exercises: Seated  kicks, BLE, 10 reps Other Exercises: Mini squats, standing, 10 reps Other Exercises: Seated push outs w/ resistance, 10 reps, BUE Other Exercises: Pulling in/bicep curls, resistance given, BUE, 10 reps      General Comments Discussed HHOT vs SNF option with family; pt with increased functional mobility and indep today - pt's son reports he can stay in  his room on teh 1st level, however the shower is on the second level. Discussed option of bird bath until pt gets stronger wtih [facility] services.    Pertinent Vitals/ Pain       Pain Location: Lower back Pain Descriptors / Indicators: Aching;Discomfort;Grimacing;Moaning   Frequency  Min 2X/week        Progress Toward Goals  OT Goals(current goals can now be found in the care plan section)  Progress towards OT goals: Progressing toward goals  Acute Rehab OT Goals Patient Stated Goal: To reduce pain OT Goal Formulation: With patient Time For Goal Achievement: 02/07/21 Potential to Achieve Goals: Good ADL Goals Pt Will Perform Lower Body Dressing: with supervision;with adaptive equipment;sitting/lateral leans;sit to/from stand Pt Will Transfer to Toilet: with modified independence;ambulating Pt Will Perform Toileting - Clothing Manipulation and hygiene: with modified independence;with adaptive equipment;sitting/lateral leans;sit to/from stand Additional ADL Goal #1: Pt will tolerate standing for 3 grooming tasks at the sink  Plan Discharge plan remains appropriate;Frequency remains appropriate    Co-evaluation    PT/OT/SLP Co-Evaluation/Treatment: Yes Reason for Co-Treatment: Complexity of the patient's impairments (multi-system involvement);For patient/therapist safety;To address functional/ADL transfers   OT goals addressed during session: ADL's and self-care;Proper use of Adaptive equipment and DME (functional mobility)      AM-PAC OT "6 Clicks" Daily Activity     Outcome Measure   Help from another person eating meals?: None Help from another person taking care of personal grooming?: A Little Help from another person toileting, which includes using toliet, bedpan, or urinal?: A Little Help from another person bathing (including washing, rinsing, drying)?: A Lot Help from another person to put on and taking off regular upper body clothing?: A Little Help from another  person to put on and taking off regular lower body clothing?: A Lot 6 Click Score: 17    End of Session Equipment Utilized During Treatment: Gait belt;Rolling walker  OT Visit Diagnosis: Unsteadiness on feet (R26.81);Other abnormalities of gait and mobility (R26.89);Muscle weakness (generalized) (M62.81)   Activity Tolerance Patient tolerated treatment well   Patient Left in chair;with call bell/phone within reach;with chair alarm set   Nurse Communication Mobility status        Time: 1710-1745 OT Time Calculation (min): 35 min  Charges: OT General Charges $OT Visit: 1 Visit OT Treatments $Therapeutic Activity: 8-22 mins     Cynthis Purington A Saul Fabiano 01/29/2021, 6:10 PM

## 2021-01-29 NOTE — Progress Notes (Addendum)
Physical Therapy Treatment Patient Details Name: Ryan Wells MRN: [id number] DOB: Jun 17, 1956 Today's Date: 01/29/2021    History of Present Illness 65 yo male wiht onset of N&V was admitted, had diarrhea and had to start in ICU.  Pt was dehydrated, had low Na, in lactic acidosis with fever, hypotensive.  PNA found, had MSSA bacteremia from unknown cause with septic shock.  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 Fistula cleared on 6/15 of occlusion. PMHx:  ESRD, HTN, DM.    PT Comments    Pt received in supine, agreeable to therapy session with encouragement and with good participation and tolerance for gait and stair training. Pt self-limiting at times due to pain but with encouragement able to perform greater than household distance gait task with RW and min guard to minA and 3 stairs with +2 for safety. Pt with decreased activity tolerance from baseline and remains reliant on RW for support due to B foot/hand reports of tingling/numbness and R tailbone/hip pain so updated DME in anticipation of updated discharge recommendations to home. Discussed disposition with pt/family and supervising PT Verdene Lennert and pt/family report they are able to provide 24/7 assist, so updated recommendation to home with HHPT.   Follow Up Recommendations  Home health PT;Supervision/Assistance - 24 hour;Supervision for mobility/OOB     Equipment Recommendations  Rolling walker with 5" wheels;3in1 (PT)    Recommendations for Other Services       Precautions / Restrictions Precautions Precautions: Fall Precaution Comments: monitor vitals, light headed with mobility Restrictions Weight Bearing Restrictions: No    Mobility  Bed Mobility Overal bed mobility: Needs Assistance Bed Mobility: Supine to Sit Rolling: Min assist Sidelying to sit: Min assist;+2 for physical assistance Supine to sit: Min assist;HOB elevated     General bed mobility comments: Increased time to come to  sitting and min A with elevating trunk    Transfers Overall transfer level: Needs assistance Equipment used: Rolling walker (2 wheeled) Transfers: Sit to/from Stand Sit to Stand: Min guard         General transfer comment: Min guard for safety, no physical assistance given.  Ambulation/Gait Ambulation/Gait assistance: Min assist;+2 safety/equipment Gait Distance (Feet): 200 Feet Assistive device: Rolling walker (2 wheeled) Gait Pattern/deviations: Step-through pattern;Decreased stride length;Trunk flexed Gait velocity: grossly <0.4 m/s   General Gait Details: chair follow for safety, pt reliant on RW support and needs min guard to minA for RW management/postural reminders   Stairs Stairs: Yes Stairs assistance: Min assist;+2 safety/equipment Stair Management: One rail Right;Step to pattern;Forwards (HHA on LUE) Number of Stairs: 2 General stair comments: cues for sequencing with good carryover, reliant on rail and LUE HHA to simulate B rail setup at home   Wheelchair Mobility    Modified Rankin (Stroke Patients Only)       Balance Overall balance assessment: Needs assistance;History of Falls Sitting-balance support: No upper extremity supported;Feet supported Sitting balance-Leahy Scale: Fair Sitting balance - Comments: no LOB   Standing balance support: Bilateral upper extremity supported Standing balance-Leahy Scale: Fair Standing balance comment: reliant on RW due to fatigue but able to stand with U HHA                            Cognition Arousal/Alertness: Awake/alert Behavior During Therapy: WFL for tasks assessed/performed Overall Cognitive Status: Within Functional Limits for tasks assessed  General Comments: pt ignoring cues for hand placement with transfers and needs tactile cues at times for proper placement but otherwise following 1-step commands; son able to translate but pt receptive to  instruction.      Exercises      General Comments General comments (skin integrity, edema, etc.): Discussed HHOT vs SNF option with family; pt with increased functional mobility and indep today - pt's son reports he can stay in his room on the 1st level, however the shower is on the second level. BP 144/53 seated EOB; BP 133/48 standing, SpO2 95% on RA and HR WNL with exertion      Pertinent Vitals/Pain Pain Assessment: Faces Pain Score: 8  Pain Location: Lower back Pain Descriptors / Indicators: Aching;Discomfort;Grimacing;Moaning Pain Intervention(s): Monitored during session;Repositioned (pt deferring pain meds at this time)     PT Goals (current goals can now be found in the care plan section) Acute Rehab PT Goals Patient Stated Goal: To reduce pain PT Goal Formulation: With patient Time For Goal Achievement: 02/07/21 Potential to Achieve Goals: Good Progress towards PT goals: Progressing toward goals    Frequency    Min 3X/week      PT Plan Discharge plan needs to be updated;Equipment recommendations need to be updated    Co-evaluation PT/OT/SLP Co-Evaluation/Treatment: Yes Reason for Co-Treatment: Complexity of the patient's impairments (multi-system involvement);For patient/therapist safety;To address functional/ADL transfers PT goals addressed during session: Mobility/safety with mobility;Balance;Proper use of DME       AM-PAC PT "6 Clicks" Mobility   Outcome Measure  Help needed turning from your back to your side while in a flat bed without using bedrails?: A Little Help needed moving from lying on your back to sitting on the side of a flat bed without using bedrails?: A Little Help needed moving to and from a bed to a chair (including a wheelchair)?: A Little Help needed standing up from a chair using your arms (e.g., wheelchair or bedside chair)?: A Little Help needed to walk in hospital room?: A Little Help needed climbing 3-5 steps with a railing? : A  Little 6 Click Score: 18    End of Session Equipment Utilized During Treatment: Gait belt Activity Tolerance: Patient tolerated treatment well Patient left: in chair;with call bell/phone within reach;with family/visitor present;Other (comment) (heels floated, pt encouraged to try to eat some bites of his lunch (he wants to wait until after HD since he can't use bathroom)) Nurse Communication: Mobility status;Other (comment) (DC rec change) PT Visit Diagnosis: Unsteadiness on feet (R26.81);Muscle weakness (generalized) (M62.81);Difficulty in walking, not elsewhere classified (R26.2)     Time: [id number] PT Time Calculation (min) (ACUTE ONLY): 41 min  Charges:  $Gait Training: 23-37 mins                     Ryan Ballin P., PTA Acute Rehabilitation Services Pager: [phone] Office: [place] 01/29/2021, 6:12 PM

## 2021-01-30 LAB — GLUCOSE, CAPILLARY
Glucose-Capillary: 108 mg/dL — ABNORMAL HIGH (ref 70–99)
Glucose-Capillary: 125 mg/dL — ABNORMAL HIGH (ref 70–99)
Glucose-Capillary: 101 mg/dL — ABNORMAL HIGH (ref 70–99)
Glucose-Capillary: 138 mg/dL — ABNORMAL HIGH (ref 70–99)
Glucose-Capillary: 104 mg/dL — ABNORMAL HIGH (ref 70–99)

## 2021-01-30 LAB — HEPATITIS B SURFACE ANTIGEN: Hepatitis B Surface Ag: NONREACTIVE

## 2021-01-30 MED ORDER — MIDODRINE HCL 10 MG PO TABS: 10.0000 mg | ORAL_TABLET | Freq: Three times a day (TID) | ORAL | 0 refills | Status: DC

## 2021-01-30 MED ORDER — PENTAFLUOROPROP-TETRAFLUOROETH EX AERO: 1.0000 "application " | INHALATION_SPRAY | CUTANEOUS | 0 refills | Status: DC | PRN

## 2021-01-30 MED ORDER — DARBEPOETIN ALFA 60 MCG/0.3ML IJ SOSY: 60.0000 ug | PREFILLED_SYRINGE | INTRAMUSCULAR | Status: DC

## 2021-01-30 MED ORDER — DOXERCALCIFEROL 4 MCG/2ML IV SOLN: 4.0000 ug | INTRAVENOUS | Status: DC

## 2021-01-30 MED ORDER — CEFAZOLIN SODIUM-DEXTROSE 2-4 GM/100ML-% IV SOLN: 2.0000 g | INTRAVENOUS | Status: DC

## 2021-01-30 NOTE — Progress Notes (Signed)
Occupational Therapy Treatment Patient Details Name: Ryan Wells MRN: [id number] DOB: [birth year] Today's Date: 01/30/2021    History of present illness 65 yo male wiht onset of N&V was admitted, had diarrhea and had to start in ICU.  Pt was dehydrated, had low Na, in lactic acidosis with fever, hypotensive.  PNA found, had MSSA bacteremia from unknown cause with septic shock.  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 Fistula cleared on 6/15 of occlusion. PMHx:  ESRD, HTN, DM.   OT comments  Edsil continues to progress really well with reports of decreased pain this morning. Pt was min A for bed mobility and min guard for all functional room distance ambulation with rw, no apparent LOB. Pt continues to require vc for safe hand positioning during transitions. Pt was educated on reacher and sock aide use, he demonstrated great ability to complete LB dressing with AE and min A. Reviewed home safety and importance to complete bird baths downstairs upon d/c home, until cleared by home health services to navigate the flight of stairs. Pt verbalized understanding. Pt benefits from continued acute OT services however has d/c plans for today. D/c plan has been updated to home with [place], and supervision 24/7.    Follow Up Recommendations  Home health OT;Supervision/Assistance - 24 hour    Equipment Recommendations  Tub/shower bench       Precautions / Restrictions Precautions Precautions: Fall Restrictions Weight Bearing Restrictions: No       Mobility Bed Mobility Overal bed mobility: Needs Assistance Bed Mobility: Supine to Sit     Supine to sit: Min assist;HOB elevated     General bed mobility comments: Increased time to come to sitting and min A to elevate trunk    Transfers Overall transfer level: Needs assistance Equipment used: Rolling walker (2 wheeled) Transfers: Sit to/from Stand Sit to Stand: Min guard         General transfer comment: Min  guard for safety, no physical assistance given.    Balance Overall balance assessment: Needs assistance;History of Falls Sitting-balance support: No upper extremity supported;Feet supported Sitting balance-Leahy Scale: Good     Standing balance support: Bilateral upper extremity supported Standing balance-Leahy Scale: Fair Standing balance comment: reliant on BUE supported on rw during functional ambulation - able to static stand without support of RW         ADL either performed or assessed with clinical judgement   ADL Overall ADL's : Needs assistance/impaired           Lower Body Dressing: Minimal assistance;Cueing for compensatory techniques Lower Body Dressing Details (indicate cue type and reason): demonstrated good ability to use reacher to doff socks & don simulated underwear; demonstrated understanding of sock aide use Toilet Transfer: Min Marine scientist Details (indicate cue type and reason): simulate to chair - pt requries cues for hand placement         Functional mobility during ADLs: Min guard;Rolling walker General ADL Comments: min A / min guard for all ADLs at this time - pt benefits from vc for safe hand placement throughout all transitions     Cognition Arousal/Alertness: Awake/alert Behavior During Therapy: [facility] for tasks assessed/performed Overall Cognitive Status: Within Functional Limits for tasks assessed        General Comments: pt continues to requrie hand placement cues for safe transitions              General Comments VSS of RA - no reports of dizziness  Pertinent Vitals/ Pain       Pain Assessment: Faces Faces Pain Scale: Hurts a little bit Pain Location: Lower back Pain Descriptors / Indicators: Discomfort;Grimacing;Guarding Pain Intervention(s): Monitored during session (pt reported improved pain this morning)   Frequency  Min 2X/week        Progress Toward Goals  OT Goals(current goals can now  be found in the care plan section)  Progress towards OT goals: Progressing toward goals  Acute Rehab OT Goals Patient Stated Goal: To reduce pain OT Goal Formulation: With patient Time For Goal Achievement: 02/07/21 Potential to Achieve Goals: Good ADL Goals Pt Will Perform Lower Body Dressing: with supervision;with adaptive equipment;sitting/lateral leans;sit to/from stand Pt Will Transfer to Toilet: with modified independence;ambulating Pt Will Perform Toileting - Clothing Manipulation and hygiene: with modified independence;with adaptive equipment;sitting/lateral leans;sit to/from stand Additional ADL Goal #1: Pt will tolerate standing for 3 grooming tasks at the sink  Plan Discharge plan needs to be updated       AM-PAC OT "6 Clicks" Daily Activity     Outcome Measure   Help from another person eating meals?: None Help from another person taking care of personal grooming?: A Little Help from another person toileting, which includes using toliet, bedpan, or urinal?: A Little Help from another person bathing (including washing, rinsing, drying)?: A Lot Help from another person to put on and taking off regular upper body clothing?: A Little Help from another person to put on and taking off regular lower body clothing?: A Little 6 Click Score: 18    End of Session Equipment Utilized During Treatment: Gait belt;Rolling walker  OT Visit Diagnosis: Unsteadiness on feet (R26.81);Other abnormalities of gait and mobility (R26.89);Muscle weakness (generalized) (M62.81)   Activity Tolerance Patient tolerated treatment well   Patient Left in chair;with call bell/phone within reach;with chair alarm set   Nurse Communication Mobility status        Time: 0940-1001 OT Time Calculation (min): 21 min  Charges: OT General Charges $OT Visit: 1 Visit OT Treatments $Self Care/Home Management : 8-22 mins    Cesiah Westley A Antonietta Lansdowne 01/30/2021, 10:23 AM

## 2021-01-30 NOTE — Plan of Care (Signed)

## 2021-01-30 NOTE — TOC Initial Note (Signed)
Transition of Care [facility]) - Initial/Assessment Note    Patient Details  Name: Ryan Wells MRN: [id number] Date of Birth: 11-17-1955  [facility]) CM/SW Contact:    Marilu Favre, RN Phone Number: 01/30/2021, 12:54 PM  Clinical Narrative:                 Spoke to patient and his son at bedside. Confirmed face sheet information.   Discussed PT/OT rec for HHPT/OT with 24 hour supervision/ assist by family. Son confirms family can provide assistance.   Ordered 3 in 1 , tub transfer bench and walker with [place].  Cory with [facility] accepted referral for HHPT/OT.  Star at [facility] aware.    Expected Discharge Plan: [place] Barriers to Discharge: No Barriers Identified   Patient Goals and CMS Choice Patient states their goals for this hospitalization and ongoing recovery are:: to go home CMS Medicare.gov Compare Post Acute Care list provided to:: Patient Choice offered to / list presented to : Adult Children, Patient  Expected Discharge Plan and Services Expected Discharge Plan: [place]   Discharge Planning Services: CM Consult Post Acute Care Choice: Home Health, Durable Medical Equipment Living arrangements for the past 2 months: Single Family Home Expected Discharge Date: 01/30/21               DME Arranged: 3-N-1, Tub bench, Walker rolling DME Agency: [employer] Date DME Agency Contacted: 01/30/21 Time DME Agency Contacted: [phone] Representative spoke with at DME Agency: Chrys Racer HH Arranged: PT, OT HH Agency: [place] Date [place]: 01/30/21 Time [place]: [phone] Representative spoke with at [place] Arrangements/Services Living arrangements for the past 2 months: [place] with:: Spouse Patient language and need for interpreter reviewed:: Yes Do you feel safe going back to the place where you live?: Yes      Need for Family  Participation in Patient Care: Yes (Comment) Care giver support system in place?: Yes (comment)   Criminal Activity/Legal Involvement Pertinent to Current Situation/Hospitalization: No - Comment as needed  Activities of Daily Living      Permission Sought/Granted Permission sought to share information with : Facility Art therapist granted to share information with : Yes, Verbal Permission Granted  Share Information with NAME: son  Permission granted to share info w AGENCY: SNF        Emotional Assessment Appearance:: Appears stated age Attitude/Demeanor/Rapport: Engaged Affect (typically observed): Appropriate Orientation: : Oriented to Self, Oriented to Place, Oriented to  Time, Oriented to Situation Alcohol / Substance Use: Not Applicable Psych Involvement: No (comment)  Admission diagnosis:  Dehydration [E86.0] Shock ([place]) [R57.9] Pneumonia [J18.9] Gallstones [K80.20] Swollen [R60.9] Hyperglycemia [R73.9] Diabetic gastroparesis ([place]) [E11.43, K31.84] Nausea and vomiting in adult [R11.2] ESRD needing dialysis ([place]) [N18.6, Z99.2] Intractable nausea and vomiting [R11.2] Sepsis ([place]) [A41.9] Patient Active Problem List   Diagnosis Date Noted   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

## 2021-01-30 NOTE — Progress Notes (Signed)
[facility] Progress Note   Subjective:     Patient seen and examined today at bedside. Patient laying in bed resting. He pointed towards his left upper chest, appears to be sore-pain reproduced with palpation. Denies ABD pain and N/V/D. Patient tolerated HD yesterday with net UF 2L. Plan for HD tomorrow 01/31/21.   Objective Vitals:   01/29/21 2310 01/29/21 2325 01/30/21 0103 01/30/21 0406  BP: (!) 139/57 (!) 150/62 (!) 170/69 (!) 138/59  Pulse: 64 70 68 63  Resp: '13 15 18 18  '$ Temp:  98.6 F (37 C) 99 F (37.2 C) 98.9 F (37.2 C)  TempSrc:  Oral Oral Oral  SpO2: 100% 100% 100% 99%  Weight:    67.6 kg  Height:       Physical Exam General: Well developed male; No acute respiratory distress Heart: Normal S1 and S2; No murmur, gallops, or rubs. Noted chest discomfort with palpation. Lungs: Clear throughout; No wheezing, rales, or rhonchi Abdomen: Soft, non-tender, active bowel sounds Extremities: No edema bilateral lower extremities Dialysis Access: L AVF (+) Bruit/Thrill   Filed Weights   01/29/21 0456 01/29/21 1940 01/30/21 0406  Weight: 67.2 kg 67.2 kg 67.6 kg    Intake/Output Summary (Last 24 hours) at 01/30/2021 [id number] Last data filed at 01/30/2021 0409 Gross per 24 hour  Intake 420 ml  Output 2000 ml  Net -1580 ml    Additional Objective Labs: Basic Metabolic Panel: Recent Labs  Lab 01/24/21 1920 01/26/21 0843 01/27/21 0204 01/29/21 1429  NA 132* 131* 131* 123*  K 3.3* 4.6 4.3 5.6*  CL 95* 93* 93* 86*  CO2 '27 27 28 24  '$ GLUCOSE 104* 167* 90 137*  BUN 20 37* 14 63*  CREATININE 3.85* 7.01* 4.06* 8.83*  CALCIUM 7.3* 8.3* 8.6* 8.8*  PHOS 3.1 6.7* 4.5  --    Liver Function Tests: Recent Labs  Lab 01/24/21 1920 01/26/21 0843 01/27/21 0204  ALBUMIN 2.1* 2.0* 2.0*   No results for input(s): LIPASE, AMYLASE in the last 168 hours. CBC: Recent Labs  Lab 01/24/21 0123 01/24/21 1920 01/25/21 0129 01/26/21 0843 01/27/21 0204  WBC 10.8*  11.0* 9.6 13.2* 10.6*  NEUTROABS 8.2*  --   --   --  7.8*  HGB 8.7* 8.6* 8.7* 8.6* 8.9*  HCT 27.3* 27.5* 26.4* 26.7* 27.6*  MCV 97.5 99.3 96.7 98.2 98.2  PLT 301 344 303 346 311   Blood Culture    Component Value Date/Time   SDES BLOOD RIGHT HAND 01/19/2021 0446   SDES BLOOD RIGHT HAND 01/19/2021 0446   SPECREQUEST  01/19/2021 0446    BOTTLES DRAWN AEROBIC ONLY Blood Culture adequate volume   SPECREQUEST  01/19/2021 0446    BOTTLES DRAWN AEROBIC ONLY Blood Culture adequate volume   CULT  01/19/2021 0446    NO GROWTH 5 DAYS Performed at [facility], [place] [address], [place], [place] [address]    CULT  01/19/2021 0446    NO GROWTH 5 DAYS Performed at [facility], [place] [address], [place], [place] [address]    REPTSTATUS 01/24/2021 FINAL 01/19/2021 0446   REPTSTATUS 01/24/2021 FINAL 01/19/2021 0446    Cardiac Enzymes: No results for input(s): CKTOTAL, CKMB, CKMBINDEX, TROPONINI in the last 168 hours. CBG: Recent Labs  Lab 01/29/21 1148 01/29/21 1702 01/30/21 0004 01/30/21 0104 01/30/21 0722  GLUCAP 128* 126* 108* 125* 101*   Iron Studies: No results for input(s): IRON, TIBC, TRANSFERRIN, FERRITIN in the last 72 hours. No results found  for: INR, PROTIME Studies/Results: CT ABDOMEN PELVIS W CONTRAST  Result Date: 01/28/2021 CLINICAL DATA:  Abdominal abscess/infection suspected Patient has had severe refractory hiccups for 5 days now with recent bacteremia,endocarditis. This is worrying for an intra-abdominal infection EXAM: CT ABDOMEN AND PELVIS WITH CONTRAST TECHNIQUE: Multidetector CT imaging of the abdomen and pelvis was performed using the standard protocol following bolus administration of intravenous contrast. CONTRAST:  157m OMNIPAQUE IOHEXOL 300 MG/ML  SOLN COMPARISON:  Chest CT 01/17/2021, abdominopelvic CT 01/15/2021 FINDINGS: Lower chest: Cavitary nodules in the right lung base consistent with septic emboli. These are coalescing and more well-defined  than on prior exam. Right middle lobe nodule measures 19 mm, series 5 image 1, abuts the diaphragm. There additional septic emboli in the right lower lobe anteriorly that abut the diaphragm, series 5, image 16. Heart is enlarged. Mild fluid in the distal esophagus. Small right pleural effusion is similar. Trace left pleural effusion with improvement. Hepatobiliary: No focal hepatic lesion or hepatic abnormality. No intrahepatic or subcapsular collection. Small calcified gallstone within distended gallbladder but no pericholecystic inflammation. No biliary dilatation. Pancreas: No ductal dilatation or inflammation. Spleen: Normal in size without focal abnormality. Adrenals/Urinary Tract: Normal adrenal glands. Bilateral renal parenchymal atrophy and areas of scarring. No hydronephrosis. No renal fluid collection. Absent renal excretion on delayed phase imaging. No renal calculi. Nondistended but thick walled urinary bladder. Stomach/Bowel: Minimal distal esophageal wall thickening. Unremarkable stomach. Normal small bowel without obstruction or inflammation. Normal appendix. Moderate volume of colonic stool. There is no colonic wall thickening or pericolonic edema. Vascular/Lymphatic: Aortic atherosclerosis without aneurysm. Patent portal vein. No acute vascular findings. Small periportal and retroperitoneal lymph nodes are not enlarged by size criteria. Reproductive: Unremarkable prostate. Other: Trace free fluid in the pelvis. There is presacral soft tissue edema. No abdominopelvic fluid collection. Left testis is in the inguinal canal. Occasional subcutaneous edema in the flanks. Musculoskeletal: Lower lumbar degenerative change. No bony destruction or CT findings of discitis osteomyelitis. Endplate irregularity at L5-S1 with seen on prior exam, and not suspicious on intervening lumbar MRI. IMPRESSION: 1. Cavitary nodules in the right lung base consistent with septic emboli. These are coalescing and more  well-defined than on prior exam. Right middle and lower lobe nodules about the diaphragm, may be causing diaphragmatic irritation and cause of hiccups. 2. Small right pleural effusion is similar. Trace left pleural effusion with improvement. 3. No intra-abdominopelvic findings of infection. 4. Cholelithiasis without gallbladder inflammation. 5. Presacral soft tissue edema is nonspecific, may simply be reactive. Trace free fluid in the pelvis is new. 6. Left testis is in the inguinal canal. Aortic Atherosclerosis (ICD10-I70.0). Electronically Signed   By: MKeith RakeM.D.   On: 01/28/2021 20:21   DG CHEST PORT 1 VIEW  Result Date: 01/28/2021 CLINICAL DATA:  Bacteremia. EXAM: PORTABLE CHEST 1 VIEW COMPARISON:  January 15, 2021 FINDINGS: Enlarged cardiac silhouette. There is no evidence of pleural effusion or pneumothorax. Numerous peripheral some cavitating pulmonary nodules versus areas of infiltrate, more prominent than on the prior radiograph. Osseous structures are without acute abnormality. Soft tissues are grossly normal. IMPRESSION: Numerous peripheral some cavitating pulmonary nodules versus areas of infiltrate, more prominent than on the prior radiograph, likely representing involving septic emboli. Electronically Signed   By: DFidela SalisburyM.D.   On: 01/28/2021 16:16    Medications:  sodium chloride Stopped (01/17/21 0414)   sodium chloride Stopped (01/19/21 0013)    ceFAZolin (ANCEF) IV 2 g (01/26/21 1738)    (feeding supplement) PROSource  Plus  30 mL Oral BID BM   acetaminophen  650 mg Oral Q6H   Chlorhexidine Gluconate Cloth  6 each Topical Q0600   Chlorhexidine Gluconate Cloth  6 each Topical Q0600   darbepoetin (ARANESP) injection - DIALYSIS  60 mcg Intravenous Q Fri-HD   doxercalciferol  4 mcg Intravenous Q M,W,F-HD   feeding supplement (NEPRO CARB STEADY)  237 mL Oral BID BM   heparin  2,200 Units Dialysis Once in dialysis   heparin injection (subcutaneous)  5,000 Units  Subcutaneous Q8H   insulin aspart  0-15 Units Subcutaneous TID WC   lidocaine  1 patch Transdermal Q24H   midodrine  10 mg Oral TID WC   multivitamin  1 tablet Oral QHS   pantoprazole  40 mg Oral Daily   polyethylene glycol  17 g Oral BID   sucroferric oxyhydroxide  1,000 mg Oral TID WC    Dialysis Orders: MWF at [facility] 3h 16mn   400/500   68kg   2/2 bath  P4  AVF   - Heparin  2200  units IV TIW - Hectorol 4 mcg IV TIW  Assessment/Plan: 1. Septic Shock/MSSA bacteremia: Unclear source, AVF without signs of infection. TEE 6/10 negative for vegetations, however treating for 6 weeks with Cefazolin (through 7/20) per ID. 2. LUE edema/pain: LUE CT without abscess, then F'gram 6/15 which was negative. 3. C. Diff diarrhea: Finished course of Dificid, per ID. 4. ESRD: Continue HD per MWF schedule - tolerated HD yesterday with net UF 2L. Plan for HD 6/22 5. BP/volume: BP stable - remains on midodrine. EDW will be lower on discharge. 6. 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 7. Secondary hyperparathyroidism: CorrCa/Phos ok - continue Velphoro + VDRA. 8. Nutrition: Alb very low, continue supplements.     CoTobie PoetNP [facility] 01/30/2021,9:22 AM  LOS: 15 days

## 2021-01-30 NOTE — Progress Notes (Signed)
Discharge home. Home discharge instruction given to family . No question verbalized.

## 2021-01-30 NOTE — Discharge Summary (Signed)
Name: Ryan Wells MRN: [id number] DOB: April 28, 1956 65 y.o. PCP: Mack Hook, MD  Date of Admission: 01/15/2021  6:02 AM Date of Discharge: 01/30/21 Attending Physician: Dr. Evette Doffing  Discharge Diagnosis: Principal Problem:   Bacterial endocarditis Active Problems:   MSSA bacteremia   ESRD needing dialysis ([place])   Septic shock ([place])   C. difficile diarrhea   Discharge Medications: Allergies as of 01/30/2021   No Known Allergies      Medication List     TAKE these medications    ceFAZolin 2-4 GM/100ML-% IVPB Commonly known as: ANCEF Inject 100 mLs (2 g total) into the vein every Monday, Wednesday, and Friday at 6 PM.   Darbepoetin Alfa 60 MCG/0.3ML Sosy injection Commonly known as: ARANESP Inject 0.3 mLs (60 mcg total) into the vein every Friday with hemodialysis. Start taking on: February 02, 2021   doxercalciferol 4 MCG/2ML injection Commonly known as: HECTOROL Inject 2 mLs (4 mcg total) into the vein every Monday, Wednesday, and Friday with hemodialysis.   midodrine 10 MG tablet Commonly known as: PROAMATINE Take 1 tablet (10 mg total) by mouth 3 (three) times daily with meals.   pentafluoroprop-tetrafluoroeth Aero Commonly known as: GEBAUERS Apply 1 application topically as needed (topical anesthesia for hemodialysis).   Velphoro 500 MG chewable tablet Generic drug: sucroferric oxyhydroxide Chew 500 mg by mouth 3 (three) times daily with meals.               Durable Medical Equipment  (From admission, onward)           Start     Ordered   01/30/21 0850  For home use only DME Walker rolling  Once       Question Answer Comment  Walker: With [place] Wheels   Patient needs a walker to treat with the following condition Weakness      01/30/21 0850   01/30/21 0850  For home use only DME 3 n 1  Once        01/30/21 0850   01/30/21 0850  For home use only DME Other see comment  Once       Comments: Tub transfer bench  Question:  Length  of Need  Answer:  Lifetime   01/30/21 0850              Discharge Care Instructions  (From admission, onward)           Start     Ordered   01/30/21 0000  Leave dressing on - Keep it clean, dry, and intact until clinic visit        01/30/21 1129           Disposition and follow-up:   Ryan Wells was discharged from [facility] in Stable condition.  At the hospital follow up visit please address:  1.  Follow-up:  a.  MSSA bacteremia 2/2 endocarditis-antibiotic during dialysis.  Assess if any new       symptoms present    b.  ESRD-attending dialysis sessions, continuing to darbepoetin   c.  Deconditioning-patient progress with PT/OT   d.  Anemia of chronic disease-repeat CBC  2.  Labs / imaging needed at time of follow-up: BMP, CBC  3.  Pending labs/ test needing follow-up: None  4.  Medication Changes  Started: Midodrine  Stopped: Amlodipine, Lasix  Changed: None  Abx - Cefazolin on HD days End Date: 02/28/21  Follow-up Appointments:  Follow-up Information  Mack Hook, MD. Schedule an appointment as soon as possible for a visit.   Specialty: Internal Medicine Why: Please call and make an appointment Contact information: [place] [id number] [place] Hospital Course by problem list:  #Septic Shock (resolved) #MSSA Bacteremia  Patient was admitted for nausea/vomiting and then decompensated due to septic shock requiring pressors and ICU stay. He was found to have MSSA bacteremia and started on Ancef. CT chest showed septic pulmonary emboli with TTE and TEE negative for valvular vegetations. ID treated as infective endocarditis based on [facility] criteria and recommended continued antibiotics x6 weeks, now receiving Ancef MWF after HD. Last dose 02/28/21. He has an appointment with ID scheduled for 02/22/21.  Also recommend patient call his cardiologist Dr. Harrell Gave for follow-up  outpatient appointment.  Due to patient's extensive hospitalization he was initially recommended that he go to a SNF facility however PT and OT both the patient progressed well enough to go home with home health physical therapy and Occupational Therapy.  #LUE pain  LUE was swollen, erythematous, and painful at presentation and AV fistula infection was initially thought to be source of bacteremia. Xray of LUE showed only soft tissue swelling over olecranon thought to suggest bursitis. CT of LUE also showed soft tissue inflammation and was negative for abscess, also revealed what looked like 5.3cm embolization coil in subcutaneous tissue. He had an AV fistulagram that was negative for stenosis or impairment in bloodflow. Over time, LUE pain and tenderness improved on exam.   #Chronic back pain  Patient has a history of chronic back pain that worsened during hospitalization. MRI imaging of C, T, and L spine was negative for osteomyelitis and discitis, but revealed posteriorly projecting synovial cyst and periarticular soft tissue edema at L5-S1 that could not exclude septic arthritis. At this point, patient was already being treated with Ancef x6 weeks for MSSA bacteremia, so no further workup was initiated.   #C. Difficile diarrhea Patient had diarrhea at presentation. He was positive for c.Diff RNA but negative for toxin; ID was consulted and unsure whether this represented acute infection. He was treated with Fidaxomicin with resolution of diarrhea.   #ESRD on MWF HD Managed by nephrology while inpatient.  Patient was started on darbepoetin alfa during admission and will continue IV antibiotics during dialysis sessions.  His next dialysis session scheduled for tomorrow 01/31/2021.  #Anemia of chronic disease Hgb dropped to 8.7 from around 11 at admission. Erythropoietin-stimulating agent started by nephrology.  Patient will need follow-up with primary care provider for further management of his  anemia.  Discharge Subjective: Patient sitting up in bed no acute distress.  Discussed with him that physical therapy and Occupational Therapy both his.  Swelling of to be discharged home.  Patient agrees that he would prefer to go home with home health physical therapy and Occupational Therapy and follow-up with his providers on outpatient basis.  Discussed that he will need to follow-up with his primary care provider within the end of the week or early next week.  Patient acknowledges understanding's.  He had no other acute questions or concerns at this time.  Discharge Exam:   BP (!) 138/59 (BP Location: Right Arm)   Pulse 63   Temp 98.9 F (37.2 C) (Oral)   Resp 18   Ht '5\' 2"'$  (1.575 m)   Wt 67.6 kg   SpO2 99%   BMI 27.26  kg/m  Constitutional: well-appearing, sitting in recliner resting comfortably HENT: normocephalic atraumatic Eyes: conjunctiva non-erythematous Neck: supple Pulmonary/Chest: normal work of breathing on room air Neurological: alert & oriented x 3 Skin: warm and dry Psych: Normal mood and thought process  Pertinent Labs, Studies, and Procedures:  CBC Latest Ref Rng & Units 01/27/2021 01/26/2021 01/25/2021  WBC 4.0 - 10.5 K/uL 10.6(H) 13.2(H) 9.6  Hemoglobin 13.0 - 17.0 g/dL 8.9(L) 8.6(L) 8.7(L)  Hematocrit 39.0 - 52.0 % 27.6(L) 26.7(L) 26.4(L)  Platelets 150 - 400 K/uL 311 346 303    CMP Latest Ref Rng & Units 01/29/2021 01/27/2021 01/26/2021  Glucose 70 - 99 mg/dL 137(H) 90 167(H)  BUN 8 - 23 mg/dL 63(H) 14 37(H)  Creatinine 0.61 - 1.24 mg/dL 8.83(H) 4.06(H) 7.01(H)  Sodium 135 - 145 mmol/L 123(L) 131(L) 131(L)  Potassium 3.5 - 5.1 mmol/L 5.6(H) 4.3 4.6  Chloride 98 - 111 mmol/L 86(L) 93(L) 93(L)  CO2 22 - 32 mmol/L '24 28 27  '$ Calcium 8.9 - 10.3 mg/dL 8.8(L) 8.6(L) 8.3(L)  Total Protein 6.5 - 8.1 g/dL - - -  Total Bilirubin 0.3 - 1.2 mg/dL - - -  Alkaline Phos 38 - 126 U/L - - -  AST 15 - 41 U/L - - -  ALT 0 - 44 U/L - - -    CT Abdomen Pelvis Wo  Contrast  Result Date: 01/15/2021 CLINICAL DATA:  Acute abdominal pain, nonlocalized abdominal pain in a 65 year old male. EXAM: CT ABDOMEN AND PELVIS WITHOUT CONTRAST TECHNIQUE: Multidetector CT imaging of the abdomen and pelvis was performed following the standard protocol without IV contrast. COMPARISON:  April 08, 2018. FINDINGS: Lower chest: Bilateral nodular opacities somewhat ill-defined. Area with ground-glass features and bandlike appearance on image 15 of series 4 measuring 11 x 7 mm. Slightly denser more nodular area just above the LEFT hemidiaphragm on image 16 of series 4 measuring 11 x 6 mm. 10 x 9 mm basilar opacity with ground-glass features in the RIGHT lower lobe (image 10/4) no effusion. No consolidative changes. 8 x 4 mm pleural-based area, ground-glass and solid component w. Hepatobiliary: No focal lesion on noncontrast imaging. Cholelithiasis. Gallbladder collapse without pericholecystic stranding. Pancreas: Pancreas without contour abnormality or signs of inflammation. Spleen: Spleen normal size and contour. Adrenals/Urinary Tract: Adrenal glands are normal. Scarring of the bilateral kidneys with signs of parenchymal loss since previous imaging in this patient with end-stage renal disease. No nephrolithiasis. No ureteral calculi. Urinary bladder is normal. Stomach/Bowel: The appendix is normal. No sign of small bowel obstruction. No stranding adjacent to stomach or colon. Vascular/Lymphatic: Calcified atheromatous plaque in the abdominal aorta. No aneurysmal dilation. Smooth contour of the IVC. There is no gastrohepatic or hepatoduodenal ligament lymphadenopathy. No retroperitoneal or mesenteric lymphadenopathy. No pelvic sidewall lymphadenopathy. Reproductive: Prostate unremarkable by CT. Other: No ascites. Musculoskeletal: No acute bone finding. No destructive bone process. Spinal degenerative changes. IMPRESSION: 1. No acute findings in the abdomen or pelvis. 2. Bilateral nodular  opacities with ground-glass features in the lung bases. These findings are nonspecific and could potentially represent infectious or inflammatory changes. Correlate with any history of infection or respiratory symptoms including COVID-19 pneumonia. Suggest 8-12 week follow-up to exclude the possibility of neoplasm. 3. Cholelithiasis without evidence of acute cholecystitis. 4. Scarring of the bilateral kidneys with signs of parenchymal loss since previous imaging in this patient with end-stage renal disease. 5. Aortic atherosclerosis. Aortic Atherosclerosis (ICD10-I70.0). Electronically Signed   By: Zetta Bills M.D.   On: 01/15/2021 08:26  DG Elbow 2 Views Left  Result Date: 01/15/2021 CLINICAL DATA:  Left elbow swelling EXAM: LEFT ELBOW - 2 VIEW COMPARISON:  None. FINDINGS: No fracture or dislocation is seen. The joint spaces are preserved. No displaced elbow joint fat pads suggest an elbow joint effusion. Mild soft tissue swelling overlying the olecranon. In the absence of trauma, this appearance suggests olecranon bursitis. Surgical clips in the visualized soft tissues. IMPRESSION: Mild soft tissue swelling overlying the olecranon. In the absence of trauma, this appearance suggests olecranon bursitis. Electronically Signed   By: Julian Hy M.D.   On: 01/15/2021 21:45   US Abdomen Limited  Result Date: 01/15/2021 CLINICAL DATA:  Right upper quadrant pain EXAM: ULTRASOUND ABDOMEN LIMITED RIGHT UPPER QUADRANT COMPARISON:  CT abdomen and pelvis January 15, 2021 FINDINGS: Gallbladder: Within the gallbladder, there is a 3 mm echogenic focus which does not show significant shadowing but is felt to move slightly. There is an inherent 2 mm echogenic focus which shadows in the gallbladder. There is felt to be cholelithiasis. Cholelithiasis is actually better delineated by CT compared to this study. No gallbladder wall thickening or pericholecystic fluid. No sonographic Murphy sign noted by sonographer. Common  bile duct: Diameter: 3 mm. No intrahepatic or extrahepatic biliary duct dilatation. Liver: No focal lesion identified. Within normal limits in parenchymal echogenicity. Portal vein is patent on color Doppler imaging with normal direction of blood flow towards the liver. Other: None. IMPRESSION: Small apparent gallstones, better delineated by recent CT. No gallbladder wall thickening or pericholecystic fluid evident. Study otherwise unremarkable. Electronically Signed   By: Lowella Grip III M.D.   On: 01/15/2021 11:19   DG Chest Port 1 View  Result Date: 01/15/2021 CLINICAL DATA:  Abdominal pain for 3 days.  Nausea vomiting. EXAM: PORTABLE CHEST 1 VIEW COMPARISON:  Chest x-ray 06/08/2019, CT chest 11/27/2013 FINDINGS: Slightly increased in enlarged cardiac silhouette. The heart size and mediastinal contours are otherwise unchanged. Aortic calcification. Bilateral lower lobe patchy airspace opacities most prominent within the peripheries. No focal consolidation. Similar-appearing slightly increased interstitial markings. Blunting of the right costophrenic angle with no definite pleural effusion. No pneumothorax. No acute osseous abnormality.  Cervical surgical hardware. IMPRESSION: 1. Bilateral lower lobe patchy airspace opacities that are most prominent within the peripheries. Findings suggestive of infection/inflammation. COVID-19 infection not excluded. 2. Slightly increased in enlarged cardiac silhouette. 3.  Aortic Atherosclerosis (ICD10-I70.0). Electronically Signed   By: Iven Finn M.D.   On: 01/15/2021 22:20   ECHOCARDIOGRAM COMPLETE  Result Date: 01/16/2021    ECHOCARDIOGRAM REPORT   Patient Name:   Ryan Wells Date of Exam: 01/16/2021 Medical Rec #:  [id number]            Height:       62.0 in Accession #:    [id number]           Weight:       152.1 lb Date of Birth:  20-Aug-1955             BSA:          1.702 m Patient Age:    33 years             BP:           132/56 mmHg Patient  Gender: M                    HR:           96 bpm. Exam Location:  Inpatient Procedure: 2D Echo, Cardiac Doppler, Color Doppler and Intracardiac            Opacification Agent Indications:    Endocarditis I38  History:        Patient has prior history of Echocardiogram examinations, most                 recent 01/21/2020. CAD and Acute MI, Signs/Symptoms:Chest Pain;                 Risk Factors:Diabetes, Hypertension and Sleep Apnea. Chronic                 kidney diesase. AAA.  Sonographer:    Darlina Sicilian RDCS Referring Phys: [id number] [place]  1. Left ventricular ejection fraction, by estimation, is 55 to 60%. The left ventricle has normal function. The left ventricle has no regional wall motion abnormalities. Left ventricular diastolic parameters are consistent with Grade I diastolic dysfunction (impaired relaxation).  2. Right ventricular systolic function is normal. The right ventricular size is normal.  3. The mitral valve is normal in structure. No evidence of mitral valve regurgitation. No evidence of mitral stenosis.  4. The aortic valve is normal in structure. Aortic valve regurgitation is not visualized. Mild aortic valve sclerosis is present, with no evidence of aortic valve stenosis.  5. The inferior vena cava is normal in size with greater than 50% respiratory variability, suggesting right atrial pressure of 3 mmHg. Conclusion(s)/Recommendation(s): No evidence of valvular vegetations on this transthoracic echocardiogram. Would recommend a transesophageal echocardiogram to exclude infective endocarditis if clinically indicated. FINDINGS  Left Ventricle: Left ventricular ejection fraction, by estimation, is 55 to 60%. The left ventricle has normal function. The left ventricle has no regional wall motion abnormalities. Definity contrast agent was given IV to delineate the left ventricular  endocardial borders. The left ventricular internal cavity size was normal in size. There is no  left ventricular hypertrophy. Left ventricular diastolic parameters are consistent with Grade I diastolic dysfunction (impaired relaxation). Indeterminate filling pressures. Right Ventricle: The right ventricular size is normal. No increase in right ventricular wall thickness. Right ventricular systolic function is normal. Left Atrium: Left atrial size was normal in size. Right Atrium: Right atrial size was normal in size. Pericardium: There is no evidence of pericardial effusion. Mitral Valve: The mitral valve is normal in structure. No evidence of mitral valve regurgitation. No evidence of mitral valve stenosis. Tricuspid Valve: The tricuspid valve is normal in structure. Tricuspid valve regurgitation is trivial. No evidence of tricuspid stenosis. Aortic Valve: The aortic valve is normal in structure. Aortic valve regurgitation is not visualized. Mild aortic valve sclerosis is present, with no evidence of aortic valve stenosis. Pulmonic Valve: The pulmonic valve was normal in structure. Pulmonic valve regurgitation is trivial. No evidence of pulmonic stenosis. Aorta: The aortic root is normal in size and structure. Venous: The inferior vena cava is normal in size with greater than 50% respiratory variability, suggesting right atrial pressure of 3 mmHg. IAS/Shunts: No atrial level shunt detected by color flow Doppler.  LEFT VENTRICLE PLAX 2D LVIDd:         5.10 cm      Diastology LVIDs:         3.70 cm      LV e' medial:    5.11 cm/s LV PW:         0.80 cm      LV E/e' medial:  17.8 LV IVS:  0.90 cm      LV e' lateral:   9.25 cm/s LVOT diam:     1.90 cm      LV E/e' lateral: 9.9 LV SV:         56 LV SV Index:   33 LVOT Area:     2.84 cm  LV Volumes (MOD) LV vol d, MOD A2C: 114.0 ml LV vol d, MOD A4C: 124.0 ml LV vol s, MOD A2C: 48.3 ml LV vol s, MOD A4C: 66.7 ml LV SV MOD A2C:     65.7 ml LV SV MOD A4C:     124.0 ml LV SV MOD BP:      66.0 ml RIGHT VENTRICLE RV S prime:     23.80 cm/s TAPSE (M-mode): 2.0 cm  LEFT ATRIUM             Index       RIGHT ATRIUM          Index LA diam:        3.40 cm 2.00 cm/m  RA Area:     9.66 cm LA Vol (A2C):   28.3 ml 16.63 ml/m RA Volume:   17.40 ml 10.22 ml/m LA Vol (A4C):   38.4 ml 22.56 ml/m LA Biplane Vol: 33.3 ml 19.57 ml/m  AORTIC VALVE LVOT Vmax:   113.00 cm/s LVOT Vmean:  79.700 cm/s LVOT VTI:    0.196 m  AORTA Ao Root diam: 3.40 cm Ao Asc diam:  3.20 cm MITRAL VALVE MV Area (PHT): 3.54 cm    SHUNTS MV Decel Time: 214 msec    Systemic VTI:  0.20 m MV E velocity: 91.20 cm/s  Systemic Diam: 1.90 cm MV A velocity: 97.00 cm/s MV E/A ratio:  0.94 Mihai Croitoru MD Electronically signed by Sanda Klein MD Signature Date/Time: 01/16/2021/12:08:55 PM    Final      Discharge Instructions: Discharge Instructions     Call MD for:  difficulty breathing, headache or visual disturbances   Complete by: As directed    Call MD for:  extreme fatigue   Complete by: As directed    Call MD for:  hives   Complete by: As directed    Call MD for:  persistant dizziness or light-headedness   Complete by: As directed    Call MD for:  persistant nausea and vomiting   Complete by: As directed    Call MD for:  redness, tenderness, or signs of infection (pain, swelling, redness, odor or green/yellow discharge around incision site)   Complete by: As directed    Call MD for:  severe uncontrolled pain   Complete by: As directed    Call MD for:  temperature >100.4   Complete by: As directed    Diet - low sodium heart healthy   Complete by: As directed    Increase activity slowly   Complete by: As directed    Leave dressing on - Keep it clean, dry, and intact until clinic visit   Complete by: As directed        Signed: Riesa Pope, MD 01/30/2021, 1:24 PM   Pager: [phone]

## 2021-01-31 ENCOUNTER — Other Ambulatory Visit: Payer: Self-pay

## 2021-01-31 DIAGNOSIS — Z992 Dependence on renal dialysis: Secondary | ICD-10-CM | POA: Diagnosis not present

## 2021-01-31 DIAGNOSIS — N2581 Secondary hyperparathyroidism of renal origin: Secondary | ICD-10-CM | POA: Diagnosis not present

## 2021-01-31 DIAGNOSIS — N186 End stage renal disease: Secondary | ICD-10-CM | POA: Diagnosis not present

## 2021-01-31 LAB — HEPATITIS B SURFACE ANTIBODY, QUANTITATIVE: Hep B S AB Quant (Post): 22.5 m[IU]/mL (ref >9.9)

## 2021-01-31 NOTE — Patient Outreach (Signed)
[facility] [facility] [facility]) Care Management  01/31/2021  Ryan Wells 19-Aug-1955 [id number]   Referral Date: 01/31/21 Referral Source: [employer] Report Date of Discharge: 01/30/21 Facility: [place]: [facility]   Referral received.  No outreach warranted at this time.  Transition of Care calls being completed via EMMI.    Plan: RN CM will close case.    Jone Baseman, RN, MSN [facility] Care Management Care Management Coordinator Direct Line [phone] Toll Free: [phone]  Fax: [phone]

## 2021-02-02 DIAGNOSIS — D631 Anemia in chronic kidney disease: Secondary | ICD-10-CM | POA: Diagnosis not present

## 2021-02-02 DIAGNOSIS — N2581 Secondary hyperparathyroidism of renal origin: Secondary | ICD-10-CM | POA: Diagnosis not present

## 2021-02-02 DIAGNOSIS — A4101 Sepsis due to Methicillin susceptible Staphylococcus aureus: Secondary | ICD-10-CM | POA: Diagnosis not present

## 2021-02-02 DIAGNOSIS — E1122 Type 2 diabetes mellitus with diabetic chronic kidney disease: Secondary | ICD-10-CM | POA: Diagnosis not present

## 2021-02-02 DIAGNOSIS — M7022 Olecranon bursitis, left elbow: Secondary | ICD-10-CM | POA: Diagnosis not present

## 2021-02-02 DIAGNOSIS — A0472 Enterocolitis due to Clostridium difficile, not specified as recurrent: Secondary | ICD-10-CM | POA: Diagnosis not present

## 2021-02-02 DIAGNOSIS — Z992 Dependence on renal dialysis: Secondary | ICD-10-CM | POA: Diagnosis not present

## 2021-02-02 DIAGNOSIS — I120 Hypertensive chronic kidney disease with stage 5 chronic kidney disease or end stage renal disease: Secondary | ICD-10-CM | POA: Diagnosis not present

## 2021-02-02 DIAGNOSIS — I330 Acute and subacute infective endocarditis: Secondary | ICD-10-CM | POA: Diagnosis not present

## 2021-02-02 DIAGNOSIS — I2690 Septic pulmonary embolism without acute cor pulmonale: Secondary | ICD-10-CM | POA: Diagnosis not present

## 2021-02-02 DIAGNOSIS — N186 End stage renal disease: Secondary | ICD-10-CM | POA: Diagnosis not present

## 2021-02-05 DIAGNOSIS — I330 Acute and subacute infective endocarditis: Secondary | ICD-10-CM | POA: Diagnosis not present

## 2021-02-05 DIAGNOSIS — I120 Hypertensive chronic kidney disease with stage 5 chronic kidney disease or end stage renal disease: Secondary | ICD-10-CM | POA: Diagnosis not present

## 2021-02-05 DIAGNOSIS — D631 Anemia in chronic kidney disease: Secondary | ICD-10-CM | POA: Diagnosis not present

## 2021-02-05 DIAGNOSIS — M7022 Olecranon bursitis, left elbow: Secondary | ICD-10-CM | POA: Diagnosis not present

## 2021-02-05 DIAGNOSIS — N186 End stage renal disease: Secondary | ICD-10-CM | POA: Diagnosis not present

## 2021-02-05 DIAGNOSIS — I2690 Septic pulmonary embolism without acute cor pulmonale: Secondary | ICD-10-CM | POA: Diagnosis not present

## 2021-02-05 DIAGNOSIS — N2581 Secondary hyperparathyroidism of renal origin: Secondary | ICD-10-CM | POA: Diagnosis not present

## 2021-02-05 DIAGNOSIS — E1122 Type 2 diabetes mellitus with diabetic chronic kidney disease: Secondary | ICD-10-CM | POA: Diagnosis not present

## 2021-02-05 DIAGNOSIS — Z992 Dependence on renal dialysis: Secondary | ICD-10-CM | POA: Diagnosis not present

## 2021-02-05 DIAGNOSIS — A4101 Sepsis due to Methicillin susceptible Staphylococcus aureus: Secondary | ICD-10-CM | POA: Diagnosis not present

## 2021-02-05 DIAGNOSIS — A0472 Enterocolitis due to Clostridium difficile, not specified as recurrent: Secondary | ICD-10-CM | POA: Diagnosis not present

## 2021-02-07 DIAGNOSIS — I330 Acute and subacute infective endocarditis: Secondary | ICD-10-CM | POA: Diagnosis not present

## 2021-02-07 DIAGNOSIS — Z992 Dependence on renal dialysis: Secondary | ICD-10-CM | POA: Diagnosis not present

## 2021-02-07 DIAGNOSIS — D631 Anemia in chronic kidney disease: Secondary | ICD-10-CM | POA: Diagnosis not present

## 2021-02-07 DIAGNOSIS — M7022 Olecranon bursitis, left elbow: Secondary | ICD-10-CM | POA: Diagnosis not present

## 2021-02-07 DIAGNOSIS — N186 End stage renal disease: Secondary | ICD-10-CM | POA: Diagnosis not present

## 2021-02-07 DIAGNOSIS — N2581 Secondary hyperparathyroidism of renal origin: Secondary | ICD-10-CM | POA: Diagnosis not present

## 2021-02-07 DIAGNOSIS — A4101 Sepsis due to Methicillin susceptible Staphylococcus aureus: Secondary | ICD-10-CM | POA: Diagnosis not present

## 2021-02-07 DIAGNOSIS — I120 Hypertensive chronic kidney disease with stage 5 chronic kidney disease or end stage renal disease: Secondary | ICD-10-CM | POA: Diagnosis not present

## 2021-02-07 DIAGNOSIS — I2690 Septic pulmonary embolism without acute cor pulmonale: Secondary | ICD-10-CM | POA: Diagnosis not present

## 2021-02-07 DIAGNOSIS — E1122 Type 2 diabetes mellitus with diabetic chronic kidney disease: Secondary | ICD-10-CM | POA: Diagnosis not present

## 2021-02-07 DIAGNOSIS — A0472 Enterocolitis due to Clostridium difficile, not specified as recurrent: Secondary | ICD-10-CM | POA: Diagnosis not present

## 2021-02-08 DIAGNOSIS — Z992 Dependence on renal dialysis: Secondary | ICD-10-CM | POA: Diagnosis not present

## 2021-02-08 DIAGNOSIS — E1122 Type 2 diabetes mellitus with diabetic chronic kidney disease: Secondary | ICD-10-CM | POA: Diagnosis not present

## 2021-02-08 DIAGNOSIS — N186 End stage renal disease: Secondary | ICD-10-CM | POA: Diagnosis not present

## 2021-02-09 DIAGNOSIS — N186 End stage renal disease: Secondary | ICD-10-CM | POA: Diagnosis not present

## 2021-02-09 DIAGNOSIS — Z992 Dependence on renal dialysis: Secondary | ICD-10-CM | POA: Diagnosis not present

## 2021-02-09 DIAGNOSIS — N2581 Secondary hyperparathyroidism of renal origin: Secondary | ICD-10-CM | POA: Diagnosis not present

## 2021-02-12 DIAGNOSIS — Z992 Dependence on renal dialysis: Secondary | ICD-10-CM | POA: Diagnosis not present

## 2021-02-12 DIAGNOSIS — N2581 Secondary hyperparathyroidism of renal origin: Secondary | ICD-10-CM | POA: Diagnosis not present

## 2021-02-12 DIAGNOSIS — N186 End stage renal disease: Secondary | ICD-10-CM | POA: Diagnosis not present

## 2021-02-14 DIAGNOSIS — M7022 Olecranon bursitis, left elbow: Secondary | ICD-10-CM | POA: Diagnosis not present

## 2021-02-14 DIAGNOSIS — D631 Anemia in chronic kidney disease: Secondary | ICD-10-CM | POA: Diagnosis not present

## 2021-02-14 DIAGNOSIS — I120 Hypertensive chronic kidney disease with stage 5 chronic kidney disease or end stage renal disease: Secondary | ICD-10-CM | POA: Diagnosis not present

## 2021-02-14 DIAGNOSIS — N186 End stage renal disease: Secondary | ICD-10-CM | POA: Diagnosis not present

## 2021-02-14 DIAGNOSIS — I330 Acute and subacute infective endocarditis: Secondary | ICD-10-CM | POA: Diagnosis not present

## 2021-02-14 DIAGNOSIS — A0472 Enterocolitis due to Clostridium difficile, not specified as recurrent: Secondary | ICD-10-CM | POA: Diagnosis not present

## 2021-02-14 DIAGNOSIS — A4101 Sepsis due to Methicillin susceptible Staphylococcus aureus: Secondary | ICD-10-CM | POA: Diagnosis not present

## 2021-02-14 DIAGNOSIS — Z992 Dependence on renal dialysis: Secondary | ICD-10-CM | POA: Diagnosis not present

## 2021-02-14 DIAGNOSIS — I2690 Septic pulmonary embolism without acute cor pulmonale: Secondary | ICD-10-CM | POA: Diagnosis not present

## 2021-02-14 DIAGNOSIS — E1122 Type 2 diabetes mellitus with diabetic chronic kidney disease: Secondary | ICD-10-CM | POA: Diagnosis not present

## 2021-02-14 DIAGNOSIS — N2581 Secondary hyperparathyroidism of renal origin: Secondary | ICD-10-CM | POA: Diagnosis not present

## 2021-02-16 DIAGNOSIS — I330 Acute and subacute infective endocarditis: Secondary | ICD-10-CM | POA: Diagnosis not present

## 2021-02-16 DIAGNOSIS — Z992 Dependence on renal dialysis: Secondary | ICD-10-CM | POA: Diagnosis not present

## 2021-02-16 DIAGNOSIS — A0472 Enterocolitis due to Clostridium difficile, not specified as recurrent: Secondary | ICD-10-CM | POA: Diagnosis not present

## 2021-02-16 DIAGNOSIS — N186 End stage renal disease: Secondary | ICD-10-CM | POA: Diagnosis not present

## 2021-02-16 DIAGNOSIS — I120 Hypertensive chronic kidney disease with stage 5 chronic kidney disease or end stage renal disease: Secondary | ICD-10-CM | POA: Diagnosis not present

## 2021-02-16 DIAGNOSIS — N2581 Secondary hyperparathyroidism of renal origin: Secondary | ICD-10-CM | POA: Diagnosis not present

## 2021-02-16 DIAGNOSIS — D631 Anemia in chronic kidney disease: Secondary | ICD-10-CM | POA: Diagnosis not present

## 2021-02-16 DIAGNOSIS — A4101 Sepsis due to Methicillin susceptible Staphylococcus aureus: Secondary | ICD-10-CM | POA: Diagnosis not present

## 2021-02-16 DIAGNOSIS — I2690 Septic pulmonary embolism without acute cor pulmonale: Secondary | ICD-10-CM | POA: Diagnosis not present

## 2021-02-16 DIAGNOSIS — E1122 Type 2 diabetes mellitus with diabetic chronic kidney disease: Secondary | ICD-10-CM | POA: Diagnosis not present

## 2021-02-16 DIAGNOSIS — M7022 Olecranon bursitis, left elbow: Secondary | ICD-10-CM | POA: Diagnosis not present

## 2021-02-19 DIAGNOSIS — D631 Anemia in chronic kidney disease: Secondary | ICD-10-CM | POA: Diagnosis not present

## 2021-02-19 DIAGNOSIS — N2581 Secondary hyperparathyroidism of renal origin: Secondary | ICD-10-CM | POA: Diagnosis not present

## 2021-02-19 DIAGNOSIS — I120 Hypertensive chronic kidney disease with stage 5 chronic kidney disease or end stage renal disease: Secondary | ICD-10-CM | POA: Diagnosis not present

## 2021-02-19 DIAGNOSIS — I2690 Septic pulmonary embolism without acute cor pulmonale: Secondary | ICD-10-CM | POA: Diagnosis not present

## 2021-02-19 DIAGNOSIS — I330 Acute and subacute infective endocarditis: Secondary | ICD-10-CM | POA: Diagnosis not present

## 2021-02-19 DIAGNOSIS — E1122 Type 2 diabetes mellitus with diabetic chronic kidney disease: Secondary | ICD-10-CM | POA: Diagnosis not present

## 2021-02-19 DIAGNOSIS — Z992 Dependence on renal dialysis: Secondary | ICD-10-CM | POA: Diagnosis not present

## 2021-02-19 DIAGNOSIS — M7022 Olecranon bursitis, left elbow: Secondary | ICD-10-CM | POA: Diagnosis not present

## 2021-02-19 DIAGNOSIS — N186 End stage renal disease: Secondary | ICD-10-CM | POA: Diagnosis not present

## 2021-02-19 DIAGNOSIS — A0472 Enterocolitis due to Clostridium difficile, not specified as recurrent: Secondary | ICD-10-CM | POA: Diagnosis not present

## 2021-02-19 DIAGNOSIS — A4101 Sepsis due to Methicillin susceptible Staphylococcus aureus: Secondary | ICD-10-CM | POA: Diagnosis not present

## 2021-02-20 DIAGNOSIS — A0472 Enterocolitis due to Clostridium difficile, not specified as recurrent: Secondary | ICD-10-CM | POA: Diagnosis not present

## 2021-02-20 DIAGNOSIS — A4101 Sepsis due to Methicillin susceptible Staphylococcus aureus: Secondary | ICD-10-CM | POA: Diagnosis not present

## 2021-02-20 DIAGNOSIS — E1122 Type 2 diabetes mellitus with diabetic chronic kidney disease: Secondary | ICD-10-CM | POA: Diagnosis not present

## 2021-02-20 DIAGNOSIS — I2690 Septic pulmonary embolism without acute cor pulmonale: Secondary | ICD-10-CM | POA: Diagnosis not present

## 2021-02-20 DIAGNOSIS — D631 Anemia in chronic kidney disease: Secondary | ICD-10-CM | POA: Diagnosis not present

## 2021-02-20 DIAGNOSIS — N186 End stage renal disease: Secondary | ICD-10-CM | POA: Diagnosis not present

## 2021-02-20 DIAGNOSIS — I120 Hypertensive chronic kidney disease with stage 5 chronic kidney disease or end stage renal disease: Secondary | ICD-10-CM | POA: Diagnosis not present

## 2021-02-20 DIAGNOSIS — I330 Acute and subacute infective endocarditis: Secondary | ICD-10-CM | POA: Diagnosis not present

## 2021-02-20 DIAGNOSIS — M7022 Olecranon bursitis, left elbow: Secondary | ICD-10-CM | POA: Diagnosis not present

## 2021-02-21 DIAGNOSIS — N2581 Secondary hyperparathyroidism of renal origin: Secondary | ICD-10-CM | POA: Diagnosis not present

## 2021-02-21 DIAGNOSIS — Z992 Dependence on renal dialysis: Secondary | ICD-10-CM | POA: Diagnosis not present

## 2021-02-21 DIAGNOSIS — N186 End stage renal disease: Secondary | ICD-10-CM | POA: Diagnosis not present

## 2021-02-22 ENCOUNTER — Inpatient Hospital Stay: Payer: Medicare HMO | Admitting: Internal Medicine

## 2021-02-23 DIAGNOSIS — N186 End stage renal disease: Secondary | ICD-10-CM | POA: Diagnosis not present

## 2021-02-23 DIAGNOSIS — N2581 Secondary hyperparathyroidism of renal origin: Secondary | ICD-10-CM | POA: Diagnosis not present

## 2021-02-23 DIAGNOSIS — Z992 Dependence on renal dialysis: Secondary | ICD-10-CM | POA: Diagnosis not present

## 2021-02-26 DIAGNOSIS — A4101 Sepsis due to Methicillin susceptible Staphylococcus aureus: Secondary | ICD-10-CM | POA: Diagnosis not present

## 2021-02-26 DIAGNOSIS — A0472 Enterocolitis due to Clostridium difficile, not specified as recurrent: Secondary | ICD-10-CM | POA: Diagnosis not present

## 2021-02-26 DIAGNOSIS — E1122 Type 2 diabetes mellitus with diabetic chronic kidney disease: Secondary | ICD-10-CM | POA: Diagnosis not present

## 2021-02-26 DIAGNOSIS — I120 Hypertensive chronic kidney disease with stage 5 chronic kidney disease or end stage renal disease: Secondary | ICD-10-CM | POA: Diagnosis not present

## 2021-02-26 DIAGNOSIS — D631 Anemia in chronic kidney disease: Secondary | ICD-10-CM | POA: Diagnosis not present

## 2021-02-26 DIAGNOSIS — Z992 Dependence on renal dialysis: Secondary | ICD-10-CM | POA: Diagnosis not present

## 2021-02-26 DIAGNOSIS — N2581 Secondary hyperparathyroidism of renal origin: Secondary | ICD-10-CM | POA: Diagnosis not present

## 2021-02-26 DIAGNOSIS — I330 Acute and subacute infective endocarditis: Secondary | ICD-10-CM | POA: Diagnosis not present

## 2021-02-26 DIAGNOSIS — M7022 Olecranon bursitis, left elbow: Secondary | ICD-10-CM | POA: Diagnosis not present

## 2021-02-26 DIAGNOSIS — I2690 Septic pulmonary embolism without acute cor pulmonale: Secondary | ICD-10-CM | POA: Diagnosis not present

## 2021-02-26 DIAGNOSIS — N186 End stage renal disease: Secondary | ICD-10-CM | POA: Diagnosis not present

## 2021-02-27 DIAGNOSIS — A4101 Sepsis due to Methicillin susceptible Staphylococcus aureus: Secondary | ICD-10-CM | POA: Diagnosis not present

## 2021-02-27 DIAGNOSIS — I330 Acute and subacute infective endocarditis: Secondary | ICD-10-CM | POA: Diagnosis not present

## 2021-02-27 DIAGNOSIS — I2690 Septic pulmonary embolism without acute cor pulmonale: Secondary | ICD-10-CM | POA: Diagnosis not present

## 2021-02-27 DIAGNOSIS — A0472 Enterocolitis due to Clostridium difficile, not specified as recurrent: Secondary | ICD-10-CM | POA: Diagnosis not present

## 2021-02-27 DIAGNOSIS — D631 Anemia in chronic kidney disease: Secondary | ICD-10-CM | POA: Diagnosis not present

## 2021-02-27 DIAGNOSIS — N186 End stage renal disease: Secondary | ICD-10-CM | POA: Diagnosis not present

## 2021-02-27 DIAGNOSIS — I120 Hypertensive chronic kidney disease with stage 5 chronic kidney disease or end stage renal disease: Secondary | ICD-10-CM | POA: Diagnosis not present

## 2021-02-27 DIAGNOSIS — E1122 Type 2 diabetes mellitus with diabetic chronic kidney disease: Secondary | ICD-10-CM | POA: Diagnosis not present

## 2021-02-27 DIAGNOSIS — M7022 Olecranon bursitis, left elbow: Secondary | ICD-10-CM | POA: Diagnosis not present

## 2021-02-28 DIAGNOSIS — Z992 Dependence on renal dialysis: Secondary | ICD-10-CM | POA: Diagnosis not present

## 2021-02-28 DIAGNOSIS — N186 End stage renal disease: Secondary | ICD-10-CM | POA: Diagnosis not present

## 2021-02-28 DIAGNOSIS — N2581 Secondary hyperparathyroidism of renal origin: Secondary | ICD-10-CM | POA: Diagnosis not present

## 2021-03-02 DIAGNOSIS — N186 End stage renal disease: Secondary | ICD-10-CM | POA: Diagnosis not present

## 2021-03-02 DIAGNOSIS — N2581 Secondary hyperparathyroidism of renal origin: Secondary | ICD-10-CM | POA: Diagnosis not present

## 2021-03-02 DIAGNOSIS — Z992 Dependence on renal dialysis: Secondary | ICD-10-CM | POA: Diagnosis not present

## 2021-03-05 ENCOUNTER — Emergency Department (HOSPITAL_COMMUNITY)
Admission: EM | Admit: 2021-03-05 | Discharge: 2021-03-06 | Disposition: A | Discharge status: Home / Self Care | Payer: Medicare HMO | Attending: Emergency Medicine | Admitting: Emergency Medicine

## 2021-03-05 ENCOUNTER — Emergency Department (HOSPITAL_COMMUNITY): Payer: Medicare HMO

## 2021-03-05 ENCOUNTER — Encounter (HOSPITAL_COMMUNITY): Payer: Self-pay | Admitting: Emergency Medicine

## 2021-03-05 ENCOUNTER — Other Ambulatory Visit: Payer: Self-pay

## 2021-03-05 DIAGNOSIS — I2690 Septic pulmonary embolism without acute cor pulmonale: Secondary | ICD-10-CM | POA: Diagnosis not present

## 2021-03-05 DIAGNOSIS — E039 Hypothyroidism, unspecified: Secondary | ICD-10-CM | POA: Diagnosis not present

## 2021-03-05 DIAGNOSIS — M5432 Sciatica, left side: Secondary | ICD-10-CM

## 2021-03-05 DIAGNOSIS — M79662 Pain in left lower leg: Secondary | ICD-10-CM | POA: Insufficient documentation

## 2021-03-05 DIAGNOSIS — A0472 Enterocolitis due to Clostridium difficile, not specified as recurrent: Secondary | ICD-10-CM | POA: Diagnosis not present

## 2021-03-05 DIAGNOSIS — Z79899 Other long term (current) drug therapy: Secondary | ICD-10-CM | POA: Insufficient documentation

## 2021-03-05 DIAGNOSIS — N186 End stage renal disease: Secondary | ICD-10-CM | POA: Insufficient documentation

## 2021-03-05 DIAGNOSIS — Z87891 Personal history of nicotine dependence: Secondary | ICD-10-CM | POA: Insufficient documentation

## 2021-03-05 DIAGNOSIS — M5410 Radiculopathy, site unspecified: Secondary | ICD-10-CM

## 2021-03-05 DIAGNOSIS — I330 Acute and subacute infective endocarditis: Secondary | ICD-10-CM | POA: Diagnosis not present

## 2021-03-05 DIAGNOSIS — M25552 Pain in left hip: Secondary | ICD-10-CM | POA: Diagnosis not present

## 2021-03-05 DIAGNOSIS — M545 Low back pain, unspecified: Secondary | ICD-10-CM | POA: Diagnosis not present

## 2021-03-05 DIAGNOSIS — I120 Hypertensive chronic kidney disease with stage 5 chronic kidney disease or end stage renal disease: Secondary | ICD-10-CM | POA: Diagnosis not present

## 2021-03-05 DIAGNOSIS — A4101 Sepsis due to Methicillin susceptible Staphylococcus aureus: Secondary | ICD-10-CM | POA: Diagnosis not present

## 2021-03-05 DIAGNOSIS — E1122 Type 2 diabetes mellitus with diabetic chronic kidney disease: Secondary | ICD-10-CM | POA: Diagnosis not present

## 2021-03-05 DIAGNOSIS — D631 Anemia in chronic kidney disease: Secondary | ICD-10-CM | POA: Diagnosis not present

## 2021-03-05 DIAGNOSIS — Z992 Dependence on renal dialysis: Secondary | ICD-10-CM | POA: Insufficient documentation

## 2021-03-05 DIAGNOSIS — N2581 Secondary hyperparathyroidism of renal origin: Secondary | ICD-10-CM | POA: Diagnosis not present

## 2021-03-05 DIAGNOSIS — M7022 Olecranon bursitis, left elbow: Secondary | ICD-10-CM | POA: Diagnosis not present

## 2021-03-05 DIAGNOSIS — M5136 Other intervertebral disc degeneration, lumbar region: Secondary | ICD-10-CM

## 2021-03-05 LAB — CBC WITH DIFFERENTIAL/PLATELET
Abs Immature Granulocytes: 0.06 10*3/uL (ref 0.00–0.07)
Basophils Absolute: 0.1 10*3/uL (ref 0.0–0.1)
Basophils Relative: 1 %
Eosinophils Absolute: 0.1 10*3/uL (ref 0.0–0.5)
Eosinophils Relative: 1 %
HCT: 27 % — ABNORMAL LOW (ref 39.0–52.0)
Hemoglobin: 8.1 g/dL — ABNORMAL LOW (ref 13.0–17.0)
Immature Granulocytes: 1 %
Lymphocytes Relative: 17 %
Lymphs Abs: 1.4 10*3/uL (ref 0.7–4.0)
MCH: 30.2 pg (ref 26.0–34.0)
MCHC: 30 g/dL (ref 30.0–36.0)
MCV: 100.7 fL — ABNORMAL HIGH (ref 80.0–100.0)
Monocytes Absolute: 0.8 10*3/uL (ref 0.1–1.0)
Monocytes Relative: 10 %
Neutro Abs: 5.7 10*3/uL (ref 1.7–7.7)
Neutrophils Relative %: 70 %
Platelets: 269 10*3/uL (ref 150–400)
RBC: 2.68 MIL/uL — ABNORMAL LOW (ref 4.22–5.81)
RDW: 15.9 % — ABNORMAL HIGH (ref 11.5–15.5)
WBC: 8 10*3/uL (ref 4.0–10.5)
nRBC: 0 % (ref 0.0–0.2)

## 2021-03-05 LAB — COMPREHENSIVE METABOLIC PANEL
ALT: 5 U/L (ref 0–44)
AST: 20 U/L (ref 15–41)
Albumin: 2.9 g/dL — ABNORMAL LOW (ref 3.5–5.0)
Alkaline Phosphatase: 167 U/L — ABNORMAL HIGH (ref 38–126)
Anion gap: 11 (ref 5–15)
BUN: 9 mg/dL (ref 8–23)
CO2: 33 mmol/L — ABNORMAL HIGH (ref 22–32)
Calcium: 8.3 mg/dL — ABNORMAL LOW (ref 8.9–10.3)
Chloride: 93 mmol/L — ABNORMAL LOW (ref 98–111)
Creatinine, Ser: 4.13 mg/dL — ABNORMAL HIGH (ref 0.61–1.24)
GFR, Estimated: 15 mL/min — ABNORMAL LOW (ref >60)
Glucose, Bld: 114 mg/dL — ABNORMAL HIGH (ref 70–99)
Potassium: 3.7 mmol/L (ref 3.5–5.1)
Sodium: 137 mmol/L (ref 135–145)
Total Bilirubin: 1 mg/dL (ref 0.3–1.2)
Total Protein: 8 g/dL (ref 6.5–8.1)

## 2021-03-05 IMAGING — CR DG LUMBAR SPINE COMPLETE 4+V
5 series · 5 of 5 positions shown · non-contrast
Comparison: MR lumbar spine, [DATE]

CLINICAL DATA: Fall, back pain for 8 months

EXAM:
LUMBAR SPINE - COMPLETE 4+ VIEW

[l-spine ap]
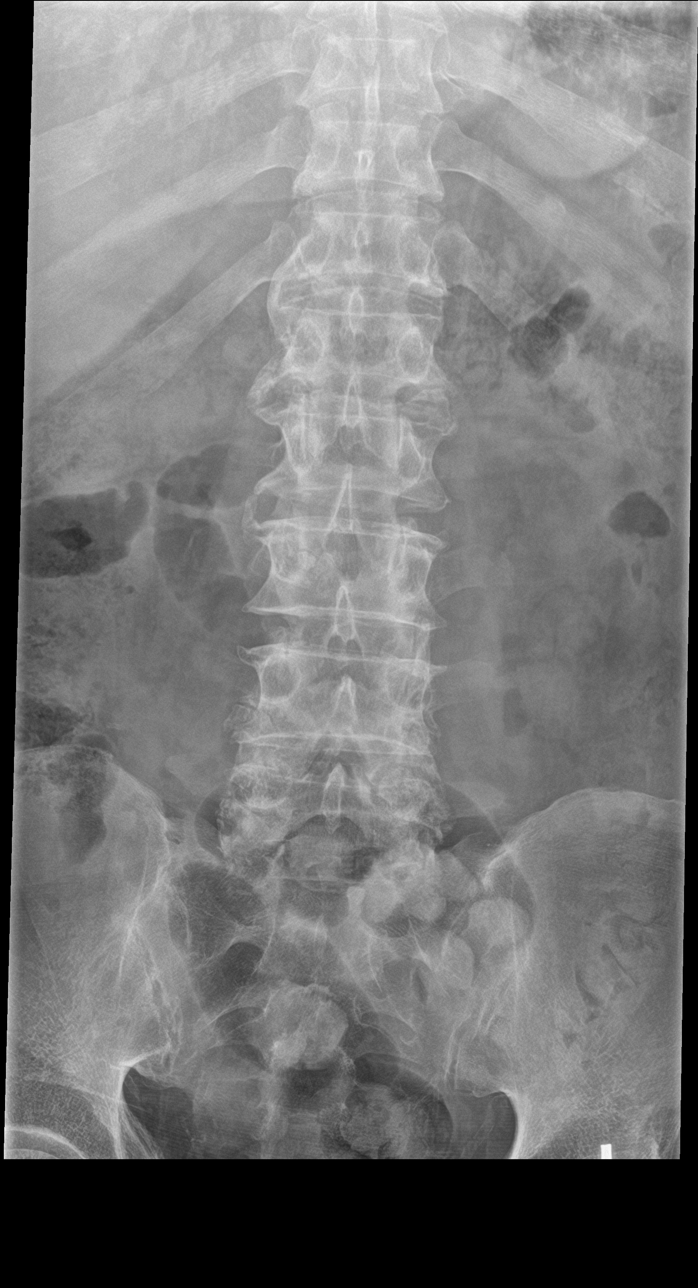

[l-spine obl (1 of 2)]
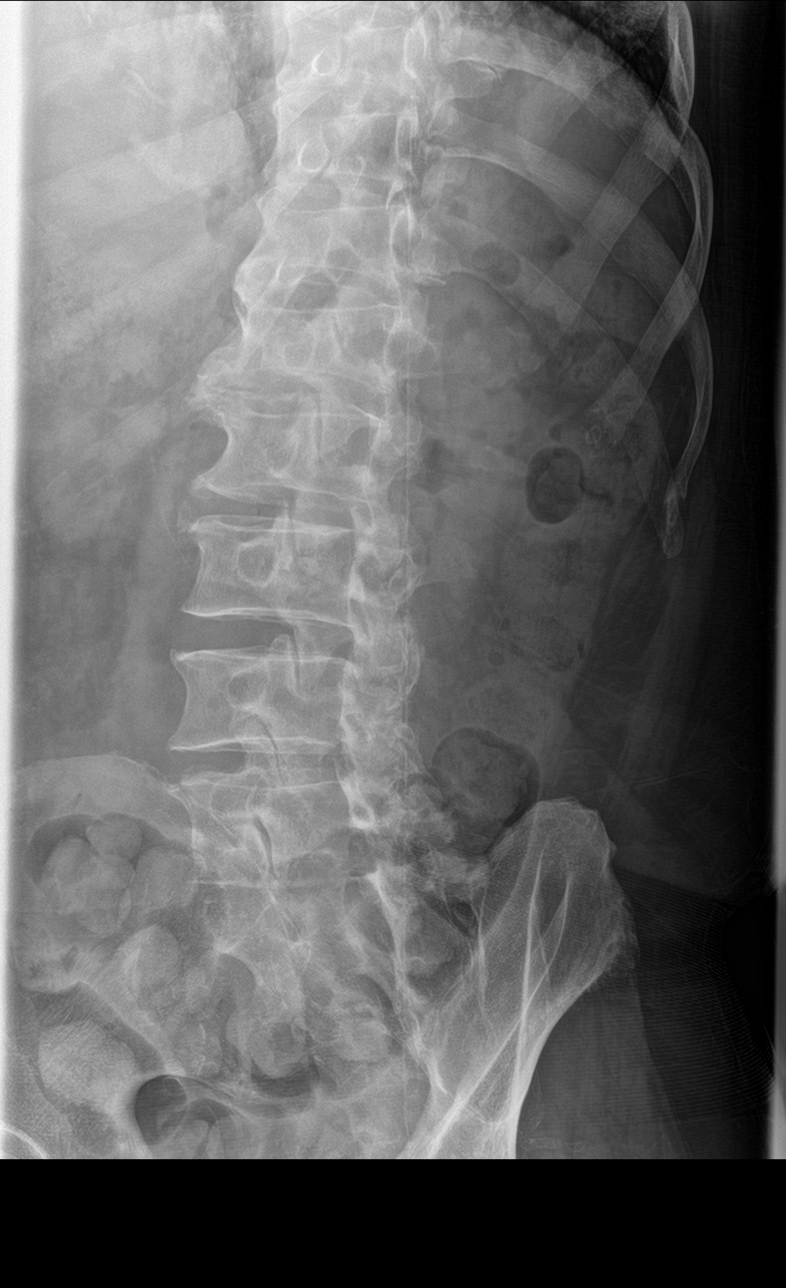

[l-spine obl (2 of 2)]
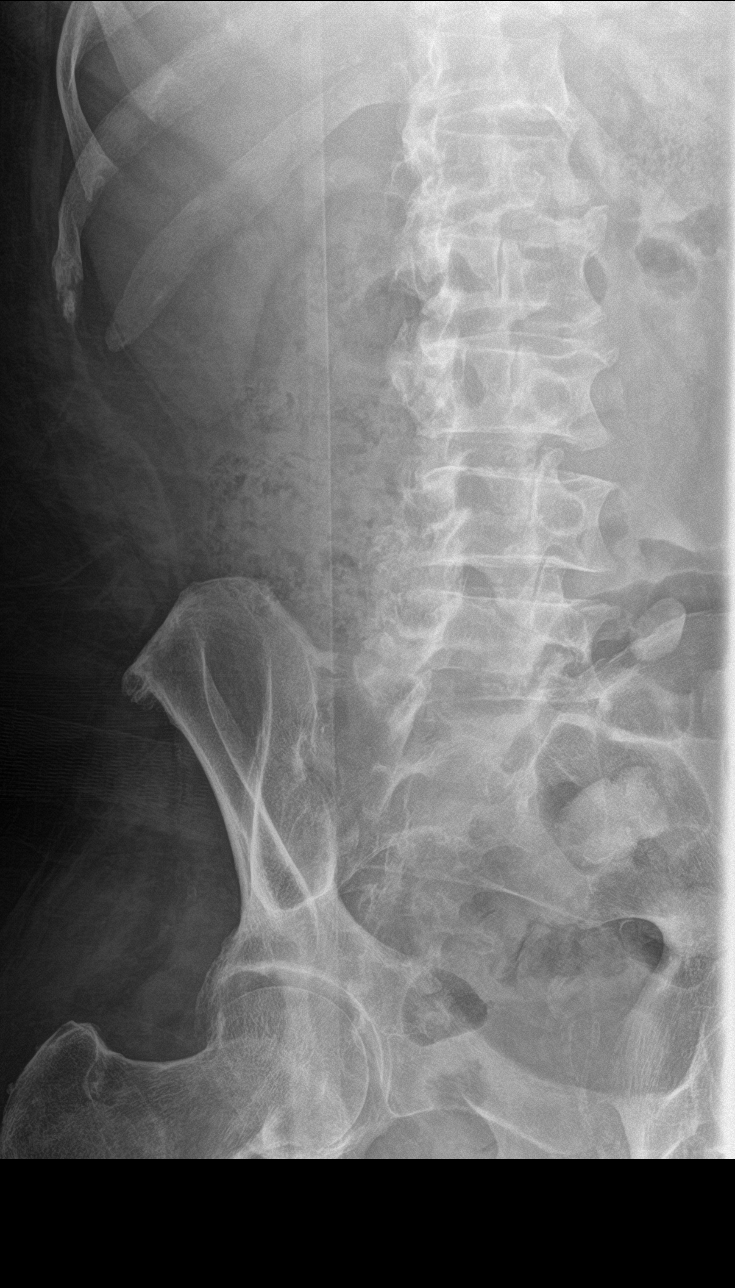

[l-spine lat]
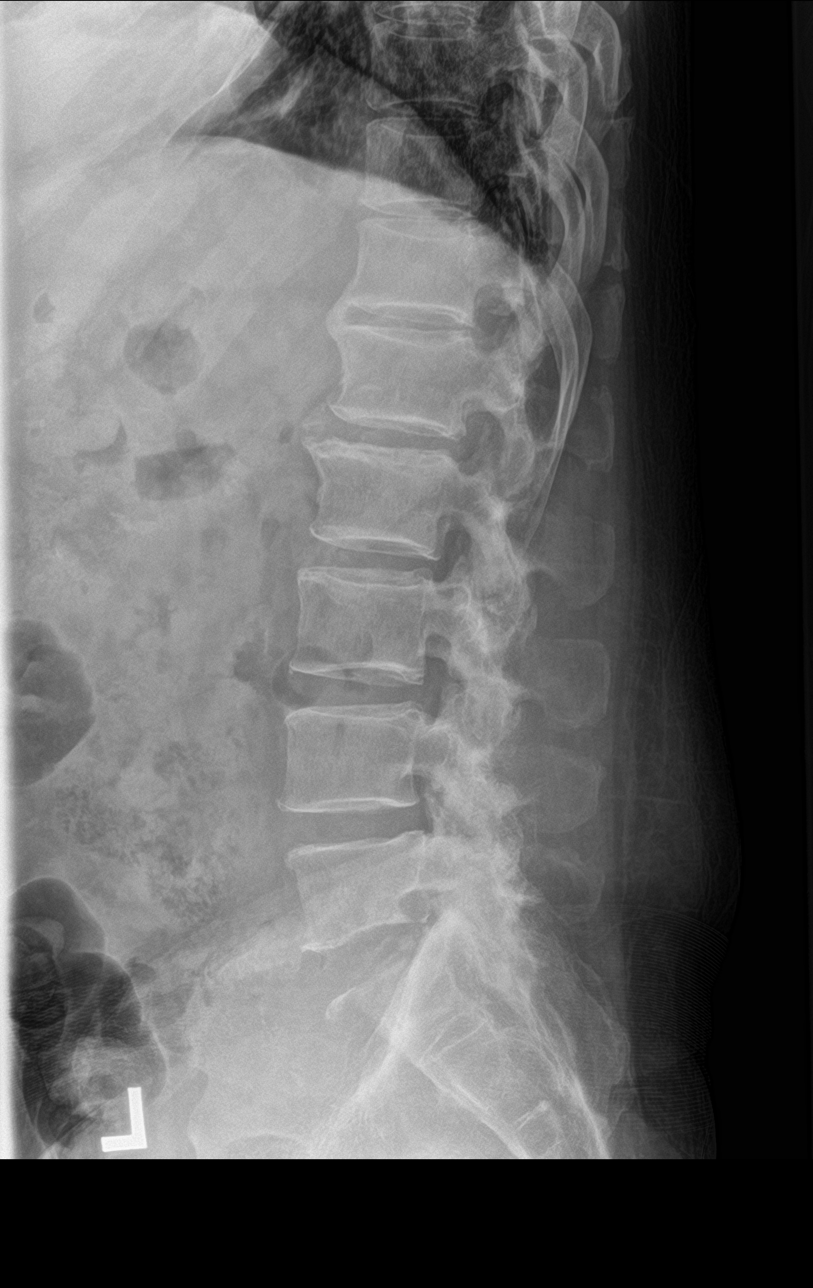

[l-spine spot]
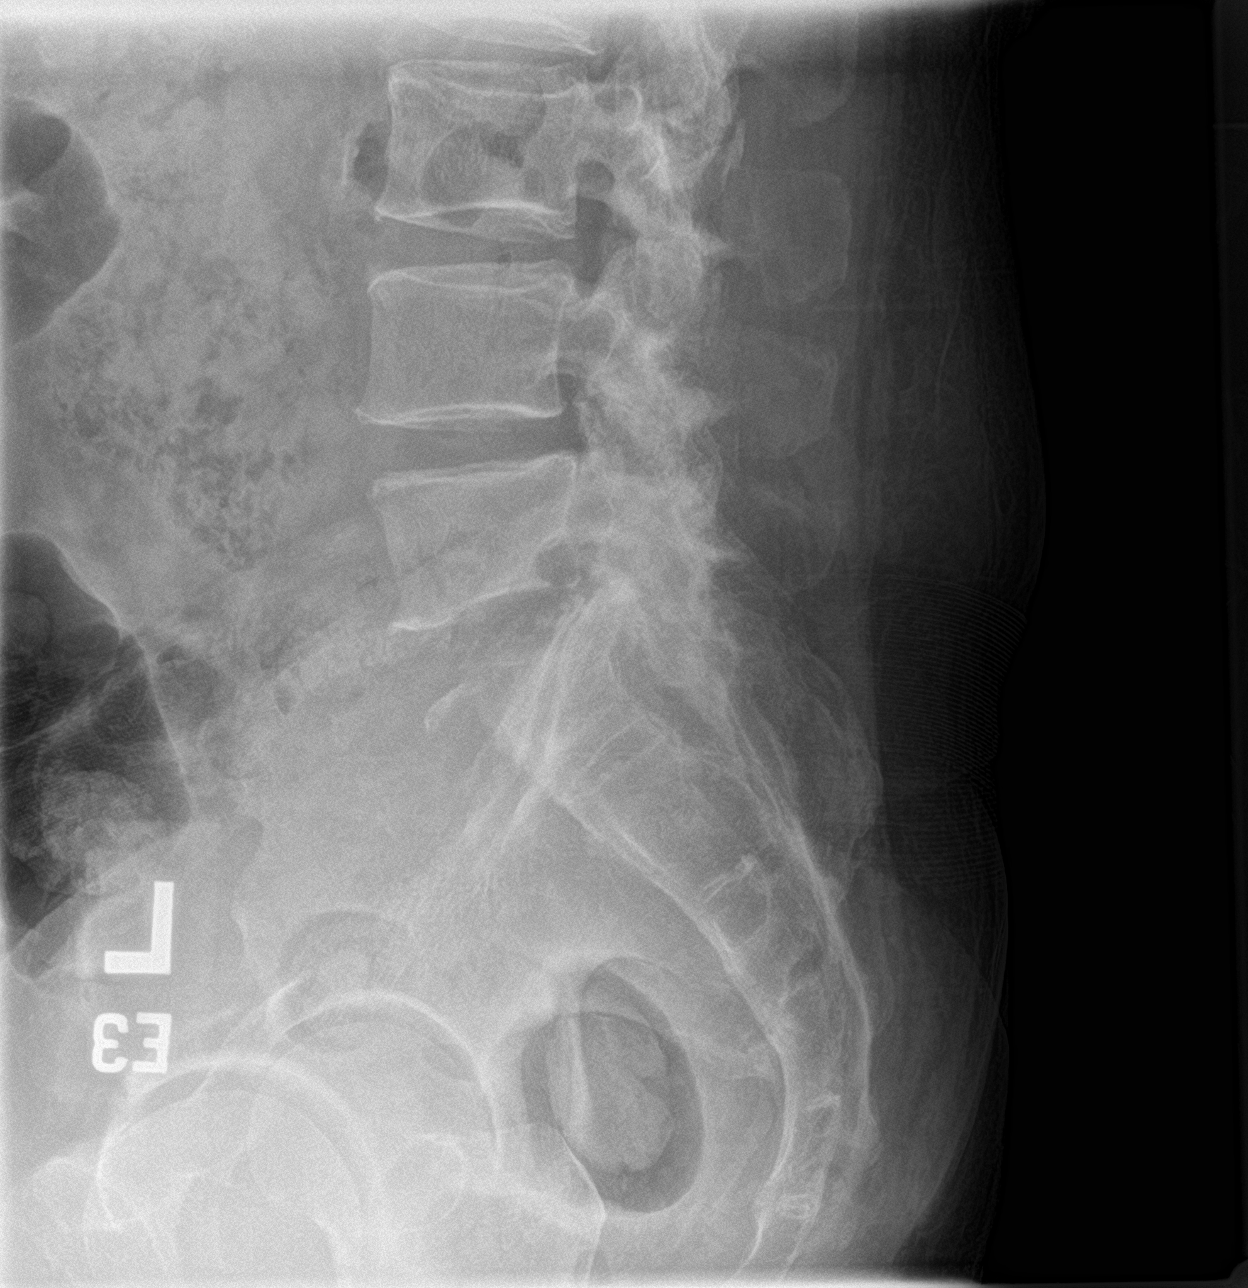

[5 of 5 positions shown; findings below may reference images not displayed]

FINDINGS: There is no evidence of lumbar spine fracture. Alignment is normal.
Mild disc space height loss and osteophytosis at L1-L2 and L5-S1
with otherwise intact disc spaces and minimal endplate
osteophytosis. Stool and stool balls throughout the colon and
rectum.
IMPRESSION: 1.  No fracture or dislocation of the lumbar spine.

2. Mild disc space height loss and osteophytosis at L1-L2 and L5-S1,
similar to prior MR.

## 2021-03-05 IMAGING — CR DG HIP (WITH OR WITHOUT PELVIS) 2-3V*L*
3 series · 3 of 3 positions shown · non-contrast
Comparison: None.

CLINICAL DATA: 65-year-old male with fall and left hip pain

EXAM:
DG HIP (WITH OR WITHOUT PELVIS) 2-3V LEFT

[pelvis ap]
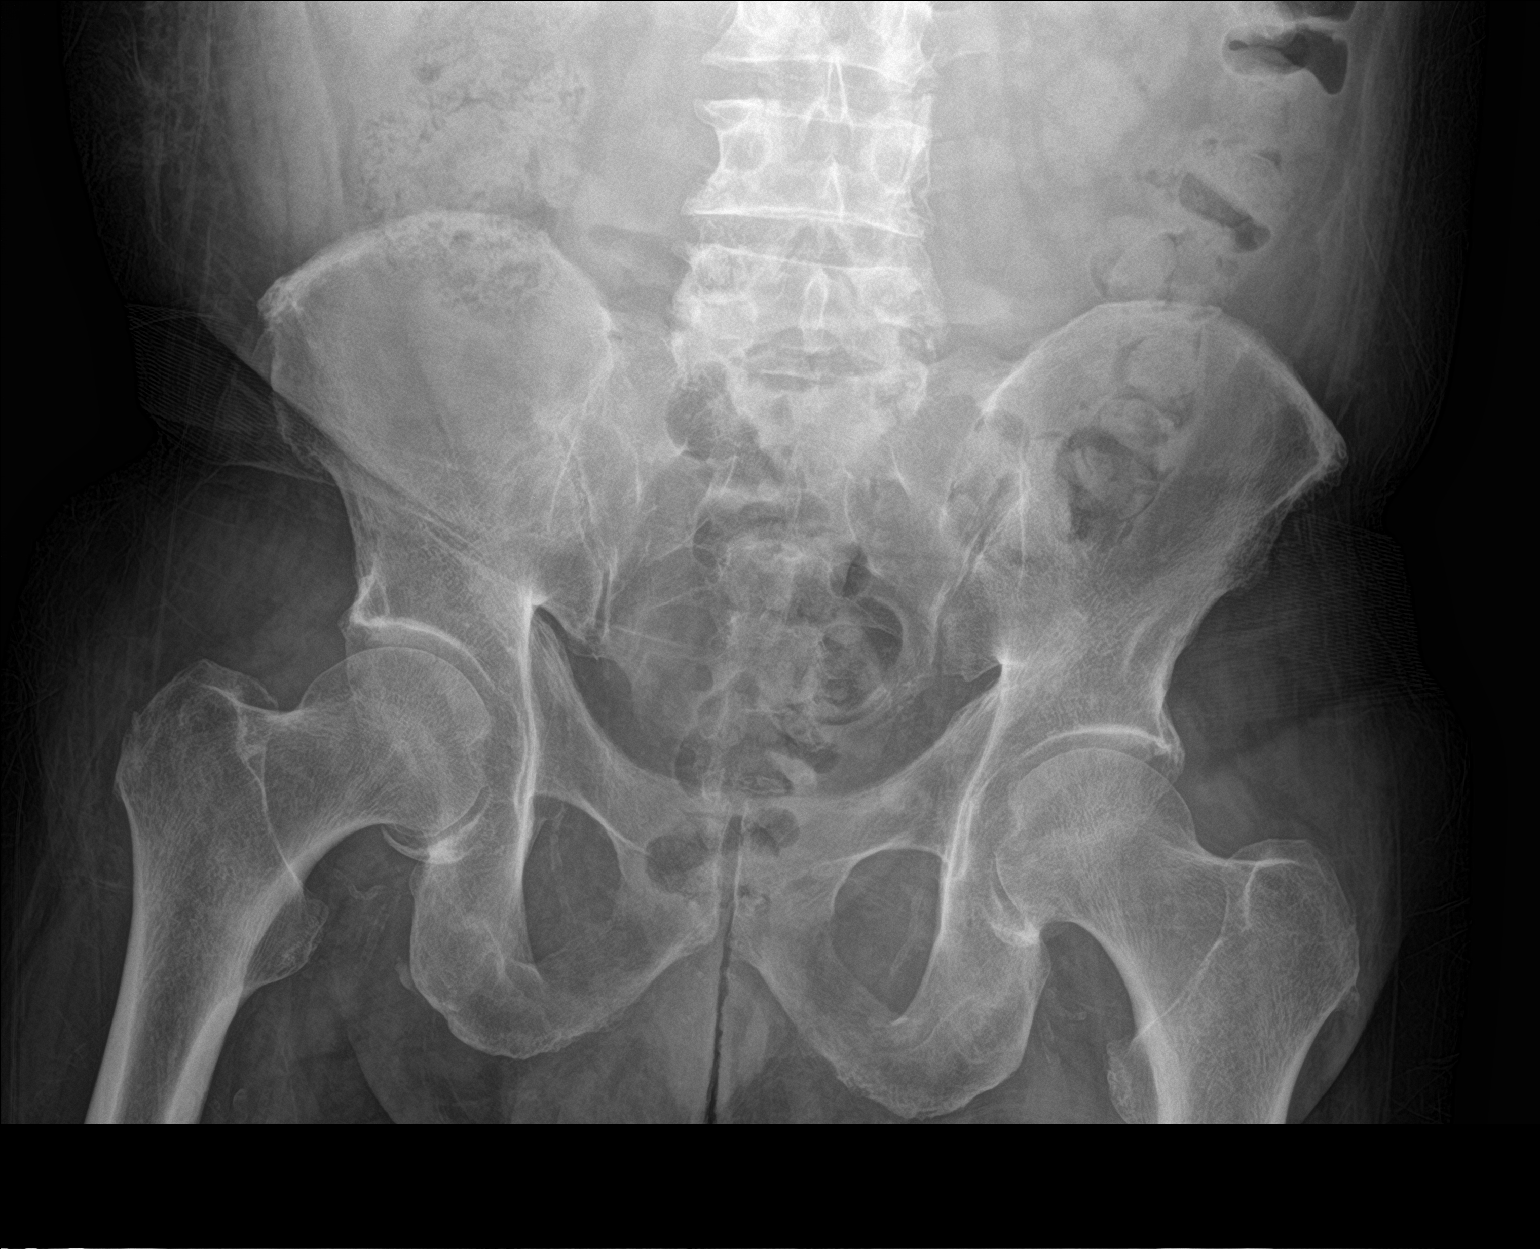

[hip ap]
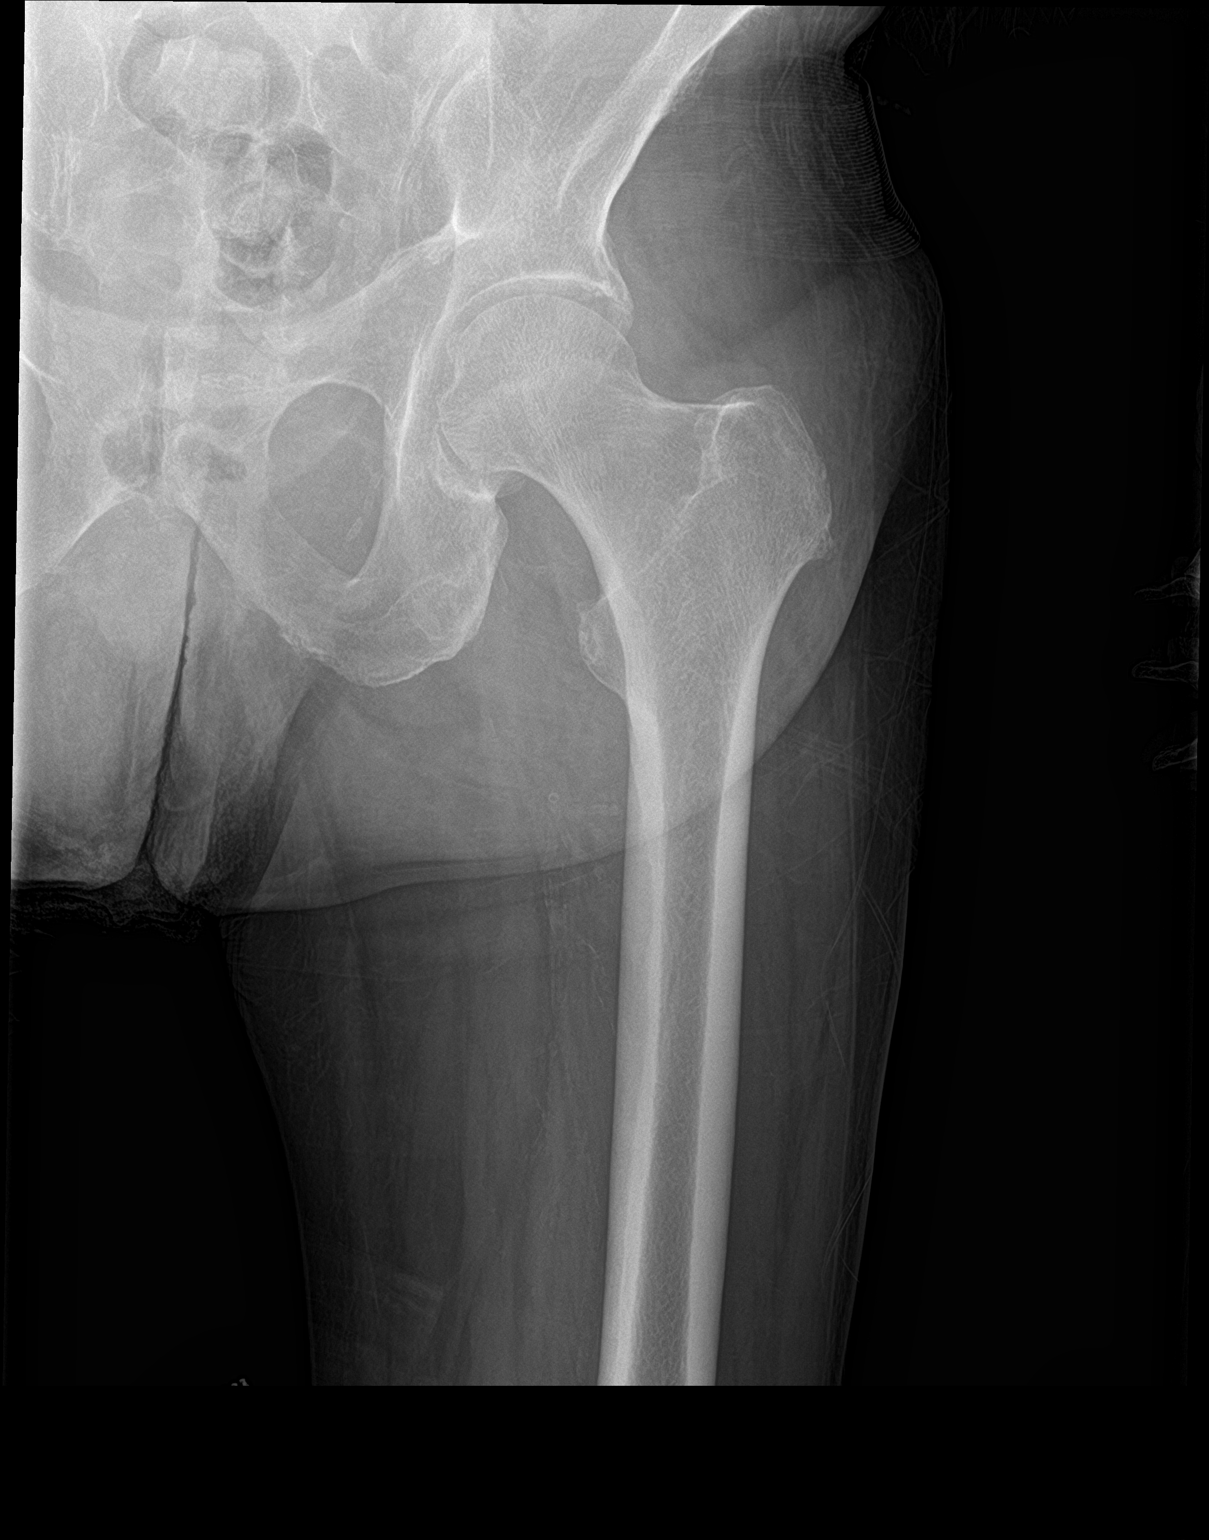

[hip lat]
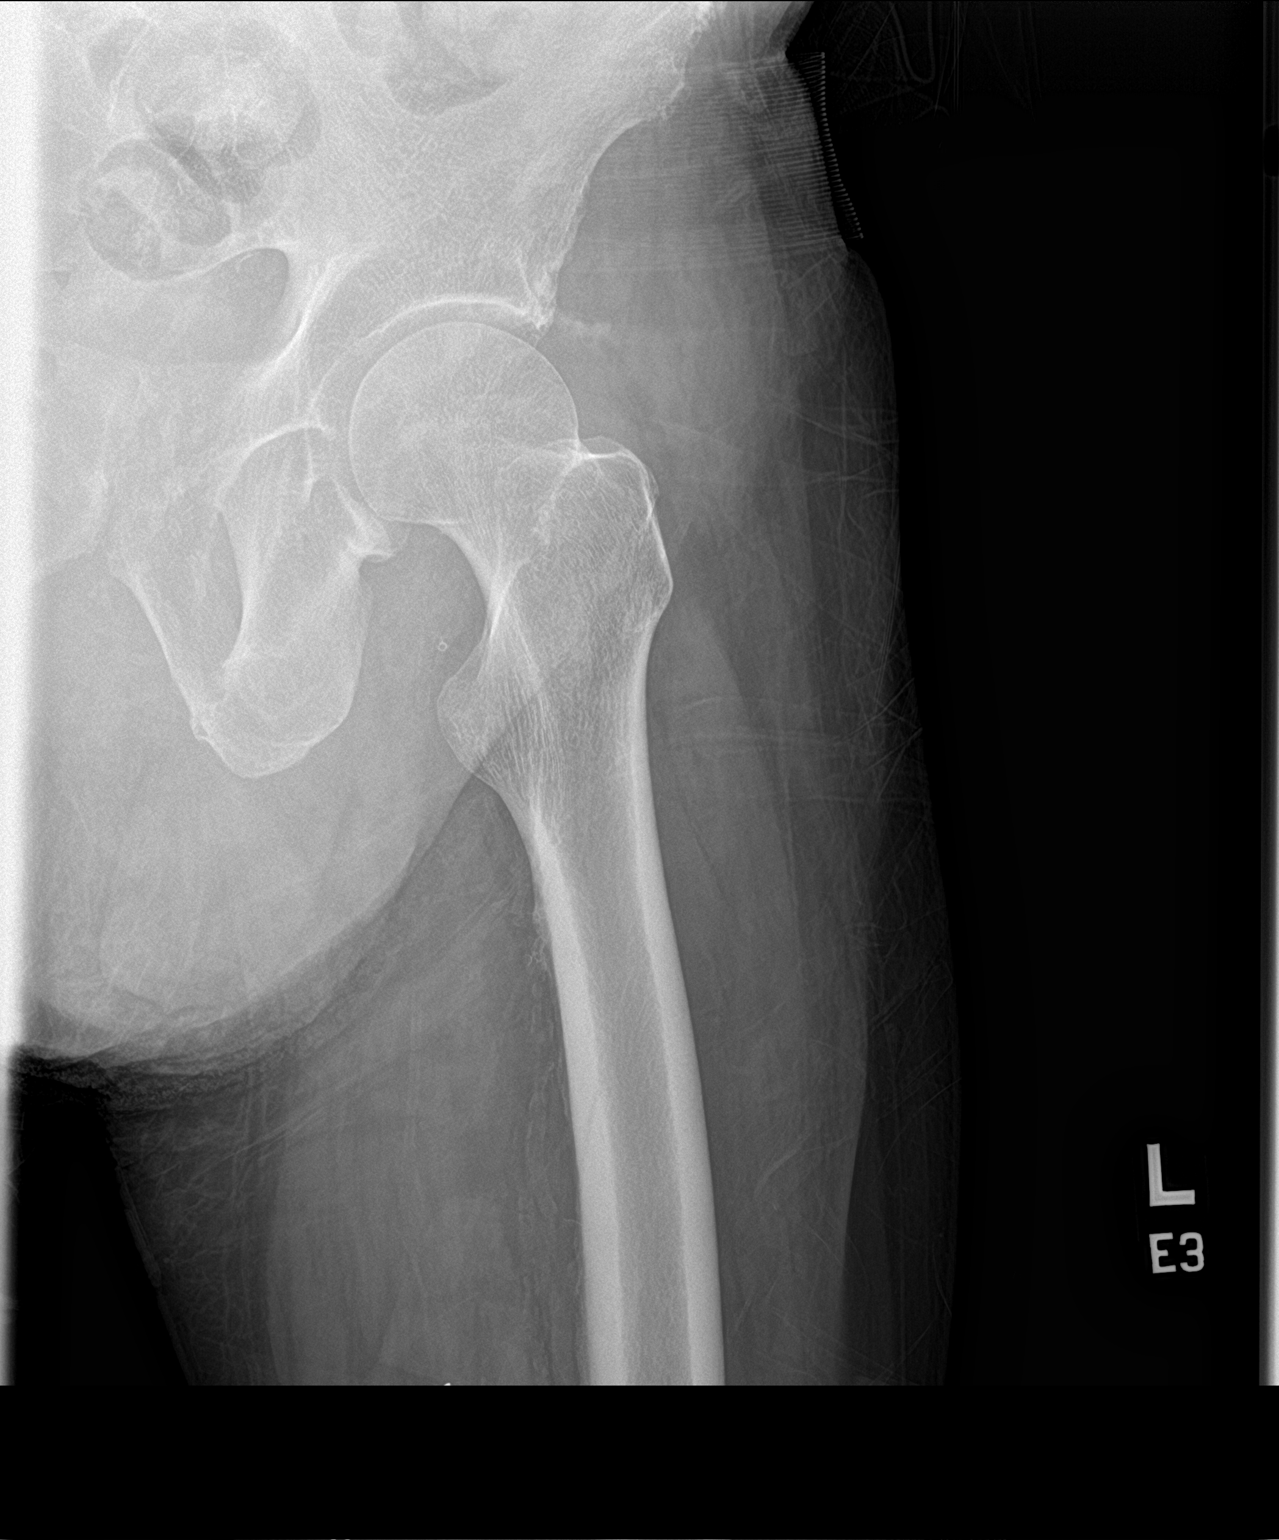

[3 of 3 positions shown; findings below may reference images not displayed]

FINDINGS: There is no acute fracture or dislocation. Mild osteopenia. There is
mild arthritic changes of the hips and degenerative changes of the
lower lumbar spine. The soft tissues are unremarkable.
IMPRESSION: No acute fracture or dislocation.

## 2021-03-05 NOTE — ED Provider Notes (Signed)
Emergency Medicine Provider Triage Evaluation Note  Ryan Wells , a 65 y.o. male  was evaluated in triage.  Pt complains of lower back pain that radiates down into his left leg.  Patient states that he is had this for 6 months, did fall 8 months ago.  Patient speaks Spanish, interpreter was used.  Patient states that he did fall on his back and on his left hip, started having pain 2 months after this.  Also admits to numbness and tingling down into his left leg.  States that the pain is so severe he is having difficulty walking.  Denies any upper extremity or right-sided issues.  Patient is a dialysis patient does go to dialysis Monday Wednesday Friday, did go today.  Has not missed any sessions.  Denies any other drug use, fevers, saddle paresthesias, bowel or bladder incontinence, urinary symptoms  Review of Systems  Positive: Back pain, leg pain Negative: Fevers  Physical Exam  BP (!) 155/74   Pulse 74   Temp 99.7 F (37.6 C) (Oral)   Resp 16   Ht '5\' 2"'$  (1.575 m)   Wt 67 kg   SpO2 99%   BMI 27.02 kg/m  Gen:   Awake, no distress   Resp:  Normal effort  MSK:   Moves extremities without difficulty, good strength in lower extremities.  Midline back pain noted to lumbar region.  No objective numbness to back.  Medical Decision Making  Medically screening exam initiated at 8:36 PM.  Appropriate orders placed.  SYERE GOLDNER was informed that the remainder of the evaluation will be completed by another provider, this initial triage assessment does not replace that evaluation, and the importance of remaining in the ED until their evaluation is complete.     Alfredia Client, PA-C 03/05/21 2037    Lennice Sites, DO 03/05/21 2324

## 2021-03-05 NOTE — ED Triage Notes (Signed)
Patient reports chronic pain at left leg radiating to lower back , denies injury or fall , hemodialysis Mon/Wed/Fri .

## 2021-03-06 MED ORDER — OXYCODONE-ACETAMINOPHEN 5-325 MG PO TABS: 1.0000 | ORAL_TABLET | Freq: Four times a day (QID) | ORAL | 0 refills | Status: DC | PRN

## 2021-03-06 MED ORDER — HYDROMORPHONE HCL 1 MG/ML IJ SOLN
1.0000 mg | Freq: Once | INTRAMUSCULAR | Status: AC
Start: 2021-03-06 — End: 2021-03-06
  Administered 2021-03-06: 1 mg via INTRAMUSCULAR
  Filled 2021-03-06: qty 1

## 2021-03-06 MED ORDER — PREDNISONE 20 MG PO TABS: ORAL_TABLET | ORAL | 0 refills | Status: DC

## 2021-03-06 MED ORDER — PREDNISONE 20 MG PO TABS
60.0000 mg | ORAL_TABLET | Freq: Once | ORAL | Status: AC
  Administered 2021-03-06: 60 mg via ORAL
  Filled 2021-03-06: qty 3

## 2021-03-06 NOTE — ED Notes (Signed)
Reviewed discharge paperwork with pt using interpretor on wheels. Pt verbalizes understanding.

## 2021-03-06 NOTE — Discharge Instructions (Signed)
It was our pleasure to provide your ER care today - we hope that you feel better.  Avoid bending at waist or heavy lifting > 10 lbs for the next week. Rest, try to find position of comfort - for example, on back with pillow behind knees, or on your side with pillow between knees. Try heat therapy and/or gentle massage of sore area.   Take prednisone as prescribed.   You may also take percocet as need for pain. No driving for the next 6 hours or when taking percocet. Also, do not take tylenol or acetaminophen containing medication when taking percocet.   We discussed your case with our back/spine specialist who reviewed your MRI scans - follow up with them in the office in the next 1-2 weeks - call office today to arrange appointment.  When you call, tell them that we discussed your case with Dr Kathyrn Sheriff.   Also follow up closely with primary care doctor to help coordinate your care.   Return to ER if worse, new symptoms, fevers, intractable pain, numbness/weakness, or other concern.

## 2021-03-06 NOTE — ED Provider Notes (Addendum)
[facility] EMERGENCY DEPARTMENT Provider Note   CSN: [id number] Arrival date & time: 03/05/21  1807     History Chief Complaint  Patient presents with   Leg Pain    Ryan Wells is a 65 y.o. male.  Patient c/o low back pain for the past 6 months. Symptoms gradual onset, mod-severe, constant, persistent, dull, radiating to bilateral legs esp left. Denies recent fall or injury. Occasionally tingling/numbness to left foot. Worse w certain movements and position changes. No saddle area numbness. States bilateral legs feel chronically weak, without acute or abrupt change or worsening. Was hospitalized in June with septic emboli - states completed antibiotic therapy, denies fever/chills/sweats. No new, acute or different pain. Makes small amt urine at baseline. No problems with normal bowel/bladder control or function. Indicates he believes he has back specialist follow up the first week in August. Currently only taking acetaminophen which does not control pain - has not yet taken today. Is dialysis pt, MWF, indicates compliant w normal dialysis.   The history is provided by the patient. A language interpreter was used (AV interpreter service used).  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  Did not get outpatient stress testing following hospitalization.  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   Long term use of drug 07/29/2016   Joint pain 07/12/2016   Hypothyroidism 07/12/2014   Diabetes type 2, uncontrolled ([place]) 11/25/2011    Past Surgical History:  Procedure Laterality Date   A/V FISTULAGRAM Left 06/04/2019   Procedure: A/V FISTULAGRAM;  Surgeon: Angelia Mould, MD;  Location: [place] CV [facility];  Service: Cardiovascular;  Laterality: Left;   A/V FISTULAGRAM Left 10/15/2019   Procedure: A/V FISTULAGRAM;  Surgeon: Angelia Mould, MD;  Location: [place] CV [facility];  Service: Cardiovascular;  Laterality: Left;   A/V FISTULAGRAM N/A 01/24/2021   Procedure: A/V FISTULAGRAM - Left Upper;  Surgeon: Cherre Robins, MD;  Location: [place] CV [facility];  Service: Cardiovascular;  Laterality: N/A;   AV FISTULA PLACEMENT Left 07/02/2018   Procedure: BRACHIOCEPHALIC ARTERIOVENOUS (AV) FISTULA CREATION LEFT ARM;  Surgeon: Serafina Mitchell, MD;  Location: [place];  Service: Vascular;  Laterality: Left;   BASCILIC VEIN TRANSPOSITION Left 07/26/2019   Procedure: Bascilic Vein Transposition;  Surgeon: Angelia Mould, MD;  Location: [place];  Service: Vascular;  Laterality: Left;   COLONOSCOPY     EMBOLIZATION Left 06/04/2019   Procedure: EMBOLIZATION;  Surgeon: Angelia Mould, MD;  Location: [place] CV [facility];  Service: Cardiovascular;  Laterality: Left;  LT ARM  FISTULA/COMPETING BRANCH   EYE SURGERY Bilateral    cataracts x2   EYE SURGERY     INSERTION OF DIALYSIS CATHETER Right 06/08/2019   Procedure: INSERTION OF PALINDROME DIALYSIS CATHETER IN RIGHT INTERNAL JUGULAR;  Surgeon: Angelia Mould, MD;  Location: [place];  Service: Vascular;  Laterality: Right;   LIGATION OF ARTERIOVENOUS  FISTULA Left 07/26/2019   Procedure: Ligation Of BrachioCephalic  Fistula;  Surgeon: Angelia Mould, MD;  Location: [place];  Service: Vascular;  Laterality: Left;   NECK SURGERY     PERIPHERAL VASCULAR BALLOON ANGIOPLASTY Left 06/04/2019   Procedure: PERIPHERAL VASCULAR BALLOON ANGIOPLASTY;  Surgeon: Angelia Mould, MD;  Location: [place] [facility];  Service: Cardiovascular;  Laterality: Left;  ARM FISTULA   PERIPHERAL VASCULAR BALLOON ANGIOPLASTY Left 10/15/2019   Procedure: PERIPHERAL VASCULAR BALLOON ANGIOPLASTY;  Surgeon: Angelia Mould, MD;  Location: [place] [facility];  Service: Cardiovascular;  Laterality: Left;  arm fistula   TEE WITHOUT CARDIOVERSION N/A 01/19/2021   Procedure: TRANSESOPHAGEAL ECHOCARDIOGRAM (TEE);  Surgeon: Jerline Pain, MD;  Location: [facility] ENDOSCOPY;  Service: Cardiovascular;  Laterality: N/A;       Family History  Problem Relation Age of Onset   Heart disease Mother        cause of death--CHF?   Diabetes Father    Kidney disease Father        Dialysis for kidney failure   Diabetes Sister    Diabetes Brother    Colon cancer Neg Hx    Stomach cancer Neg Hx    Esophageal cancer Neg Hx     Social History   Tobacco Use   Smoking status: Former   Smokeless tobacco: Never  Scientific laboratory technician Use: Never used  Substance Use Topics   Alcohol use: Not Currently   Drug use: Never    Home Medications Prior to Admission medications   Medication Sig Start Date End Date Taking? Authorizing Provider  ceFAZolin (ANCEF) 2-4 GM/100ML-% IVPB Inject 100 mLs (2 g total) into the vein every Monday, Wednesday,  and Friday at 6 PM. 01/30/21   Riesa Pope, MD  Darbepoetin Alfa (ARANESP) 60 MCG/0.3ML SOSY injection Inject 0.3 mLs (60 mcg total) into the vein every Friday with hemodialysis. 02/02/21   Riesa Pope, MD  doxercalciferol (HECTOROL) 4 MCG/2ML injection Inject 2 mLs (4 mcg total) into the vein every Monday, Wednesday, and Friday with hemodialysis. 01/30/21   Katsadouros, Vasilios, MD  midodrine (PROAMATINE) 10 MG tablet Take 1 tablet (10 mg total) by mouth 3 (three) times daily with meals. 01/30/21   Katsadouros, Vasilios, MD  pentafluoroprop-tetrafluoroeth Landry Dyke) AERO Apply 1 application topically as needed (topical anesthesia for hemodialysis). 01/30/21   Riesa Pope, MD  VELPHORO 500 MG chewable tablet Chew 500 mg by mouth 3 (three) times daily with meals. 08/18/19   [provider]    Allergies    Patient has no known allergies.  Review of Systems   Review of Systems  Constitutional:  Negative for chills and fever.  HENT:  Negative for sore throat.   Eyes:  Negative  for redness.  Respiratory:  Negative for shortness of breath.   Cardiovascular:  Negative for chest pain.  Gastrointestinal:  Negative for abdominal pain.  Genitourinary:  Negative for flank pain.  Musculoskeletal:  Positive for back pain.  Skin:  Negative for rash.  Neurological:  Negative for headaches.  Hematological:  Does not bruise/bleed easily.  Psychiatric/Behavioral:  Negative for confusion.    Physical Exam Updated Vital Signs BP (!) 156/69   Pulse 73   Temp 98.2 F (36.8 C) (Oral)   Resp 16   Ht 1.575 m ('5\' 2"'$ )   Wt 67 kg   SpO2 98%   BMI 27.02 kg/m   Physical Exam Vitals and nursing note reviewed.  Constitutional:      Appearance: Normal appearance. He is well-developed.  HENT:     Head: Atraumatic.     Nose: Nose normal.     Mouth/Throat:     Mouth: Mucous membranes are moist.     Pharynx: Oropharynx is clear.  Eyes:     General: No scleral icterus.     Conjunctiva/sclera: Conjunctivae normal.  Neck:     Trachea: No tracheal deviation.  Cardiovascular:     Rate and Rhythm: Normal rate and regular rhythm.     Pulses: Normal pulses.     Heart sounds: Normal heart sounds. No murmur heard.   No friction rub. No gallop.  Pulmonary:     Effort: Pulmonary effort is normal. No accessory muscle usage or respiratory distress.     Breath sounds: Normal breath sounds.  Abdominal:     General: Bowel sounds are normal. There is no distension.     Palpations: Abdomen is soft.     Tenderness: There is no abdominal tenderness.  Genitourinary:    Comments: No cva tenderness. Musculoskeletal:        General: No swelling.     Cervical back: Normal range of motion and neck supple. No rigidity.     Comments: LUE HD fistula w palp thrill. CTLS spine, non tender, aligned, no step off.  No sts or skin changes/erythema noted.   Skin:    General: Skin is warm and dry.     Findings: No rash.  Neurological:     Mental Status: He is alert.     Comments: Alert, speech clear. Motor intact bil lower ext, stre 5/5. Sens grossly intact. Straight leg raise + on left.   Psychiatric:        Mood and Affect: Mood normal.    ED Results / Procedures / Treatments   Labs (all labs ordered are listed, but only abnormal results are displayed) Results for orders placed or performed during the hospital encounter of 03/05/21  Comprehensive metabolic panel  Result Value Ref Range   Sodium 137 135 - 145 mmol/L   Potassium 3.7 3.5 - 5.1 mmol/L   Chloride 93 (L) 98 - 111 mmol/L   CO2 33 (H) 22 - 32 mmol/L   Glucose, Bld 114 (H) 70 - 99 mg/dL   BUN 9 8 - 23 mg/dL   Creatinine, Ser 4.13 (H) 0.61 - 1.24 mg/dL   Calcium 8.3 (L) 8.9 - 10.3 mg/dL   Total Protein 8.0 6.5 - 8.1 g/dL   Albumin 2.9 (L) 3.5 - 5.0 g/dL   AST 20 15 - 41 U/L   ALT 5 0 - 44 U/L   Alkaline Phosphatase 167 (H) 38 - 126 U/L   Total Bilirubin 1.0 0.3 - 1.2 mg/dL   GFR, Estimated  15 (L) >60 mL/min    Anion gap 11 5 - 15  CBC with Differential  Result Value Ref Range   WBC 8.0 4.0 - 10.5 K/uL   RBC 2.68 (L) 4.22 - 5.81 MIL/uL   Hemoglobin 8.1 (L) 13.0 - 17.0 g/dL   HCT 27.0 (L) 39.0 - 52.0 %   MCV 100.7 (H) 80.0 - 100.0 fL   MCH 30.2 26.0 - 34.0 pg   MCHC 30.0 30.0 - 36.0 g/dL   RDW 15.9 (H) 11.5 - 15.5 %   Platelets 269 150 - 400 K/uL   nRBC 0.0 0.0 - 0.2 %   Neutrophils Relative % 70 %   Neutro Abs 5.7 1.7 - 7.7 K/uL   Lymphocytes Relative 17 %   Lymphs Abs 1.4 0.7 - 4.0 K/uL   Monocytes Relative 10 %   Monocytes Absolute 0.8 0.1 - 1.0 K/uL   Eosinophils Relative 1 %   Eosinophils Absolute 0.1 0.0 - 0.5 K/uL   Basophils Relative 1 %   Basophils Absolute 0.1 0.0 - 0.1 K/uL   Immature Granulocytes 1 %   Abs Immature Granulocytes 0.06 0.00 - 0.07 K/uL   DG Lumbar Spine Complete  Result Date: 03/05/2021 CLINICAL DATA:  Fall, back pain for 8 months EXAM: LUMBAR SPINE - COMPLETE 4+ VIEW COMPARISON:  MR lumbar spine, 01/17/2021 FINDINGS: There is no evidence of lumbar spine fracture. Alignment is normal. Mild disc space height loss and osteophytosis at L1-L2 and L5-S1 with otherwise intact disc spaces and minimal endplate osteophytosis. Stool and stool balls throughout the colon and rectum. IMPRESSION: 1.  No fracture or dislocation of the lumbar spine. 2. Mild disc space height loss and osteophytosis at L1-L2 and L5-S1, similar to prior MR. Electronically Signed   By: Eddie Candle M.D.   On: 03/05/2021 21:34   DG Hip Unilat With Pelvis 2-3 Views Left  Result Date: 03/05/2021 CLINICAL DATA:  65 year old male with fall and left hip pain EXAM: DG HIP (WITH OR WITHOUT PELVIS) 2-3V LEFT COMPARISON:  None. FINDINGS: There is no acute fracture or dislocation. Mild osteopenia. There is mild arthritic changes of the hips and degenerative changes of the lower lumbar spine. The soft tissues are unremarkable. IMPRESSION: No acute fracture or dislocation. Electronically Signed   By: Anner Crete M.D.   On: 03/05/2021 21:29     EKG None  Radiology DG Lumbar Spine Complete  Result Date: 03/05/2021 CLINICAL DATA:  Fall, back pain for 8 months EXAM: LUMBAR SPINE - COMPLETE 4+ VIEW COMPARISON:  MR lumbar spine, 01/17/2021 FINDINGS: There is no evidence of lumbar spine fracture. Alignment is normal. Mild disc space height loss and osteophytosis at L1-L2 and L5-S1 with otherwise intact disc spaces and minimal endplate osteophytosis. Stool and stool balls throughout the colon and rectum. IMPRESSION: 1.  No fracture or dislocation of the lumbar spine. 2. Mild disc space height loss and osteophytosis at L1-L2 and L5-S1, similar to prior MR. Electronically Signed   By: Eddie Candle M.D.   On: 03/05/2021 21:34   DG Hip Unilat With Pelvis 2-3 Views Left  Result Date: 03/05/2021 CLINICAL DATA:  65 year old male with fall and left hip pain EXAM: DG HIP (WITH OR WITHOUT PELVIS) 2-3V LEFT COMPARISON:  None. FINDINGS: There is no acute fracture or dislocation. Mild osteopenia. There is mild arthritic changes of the hips and degenerative changes of the lower lumbar spine. The soft tissues are unremarkable. IMPRESSION: No acute fracture or dislocation. Electronically Signed  By: Anner Crete M.D.   On: 03/05/2021 21:29    Procedures Procedures   Medications Ordered in ED Medications  predniSONE (DELTASONE) tablet 60 mg (has no administration in time range)  HYDROmorphone (DILAUDID) injection 1 mg (has no administration in time range)    ED Course  I have reviewed the triage vital signs and the nursing notes.  Pertinent labs & imaging results that were available during my care of the patient were reviewed by me and considered in my medical decision making (see chart for details).    MDM Rules/Calculators/A&P                           Labs sent.  Xrays ordered.   Reviewed nursing notes and prior charts for additional history. Pt with MR spine during June 2022 admission. Pt  denies any new or recent  fever, chills, or sweats. Significant degenerative changes also noted then.   Prednisone po. Dilaudid 1 mg im.   Labs reviewed/interpreted by me - wbc normal. K normal.  Xrays reviewed/interpreted by me - no fx.   Neurosurgery consulted - discussed pt with Dr Kathyrn Sheriff who reviewed recent MRIs - he rec course of oral steroids, and indicates they can f/u in office, and will discuss next steps, possible lumbar injection therapy, etc.   Suspect DDD, ?sciatica/nerve impingement. Will try course prednisone and improved pain control. Rec spine f/u.   Return precautions provided.    Final Clinical Impression(s) / ED Diagnoses Final diagnoses:  None    Rx / DC Orders ED Discharge Orders     None          Lajean Saver, MD 03/06/21 1146

## 2021-03-07 DIAGNOSIS — N2581 Secondary hyperparathyroidism of renal origin: Secondary | ICD-10-CM | POA: Diagnosis not present

## 2021-03-07 DIAGNOSIS — N186 End stage renal disease: Secondary | ICD-10-CM | POA: Diagnosis not present

## 2021-03-07 DIAGNOSIS — Z992 Dependence on renal dialysis: Secondary | ICD-10-CM | POA: Diagnosis not present

## 2021-03-08 DIAGNOSIS — I120 Hypertensive chronic kidney disease with stage 5 chronic kidney disease or end stage renal disease: Secondary | ICD-10-CM | POA: Diagnosis not present

## 2021-03-08 DIAGNOSIS — I2690 Septic pulmonary embolism without acute cor pulmonale: Secondary | ICD-10-CM | POA: Diagnosis not present

## 2021-03-08 DIAGNOSIS — N186 End stage renal disease: Secondary | ICD-10-CM | POA: Diagnosis not present

## 2021-03-08 DIAGNOSIS — E1122 Type 2 diabetes mellitus with diabetic chronic kidney disease: Secondary | ICD-10-CM | POA: Diagnosis not present

## 2021-03-08 DIAGNOSIS — M7022 Olecranon bursitis, left elbow: Secondary | ICD-10-CM | POA: Diagnosis not present

## 2021-03-08 DIAGNOSIS — A4101 Sepsis due to Methicillin susceptible Staphylococcus aureus: Secondary | ICD-10-CM | POA: Diagnosis not present

## 2021-03-08 DIAGNOSIS — I330 Acute and subacute infective endocarditis: Secondary | ICD-10-CM | POA: Diagnosis not present

## 2021-03-08 DIAGNOSIS — A0472 Enterocolitis due to Clostridium difficile, not specified as recurrent: Secondary | ICD-10-CM | POA: Diagnosis not present

## 2021-03-08 DIAGNOSIS — D631 Anemia in chronic kidney disease: Secondary | ICD-10-CM | POA: Diagnosis not present

## 2021-03-09 DIAGNOSIS — N2581 Secondary hyperparathyroidism of renal origin: Secondary | ICD-10-CM | POA: Diagnosis not present

## 2021-03-09 DIAGNOSIS — Z992 Dependence on renal dialysis: Secondary | ICD-10-CM | POA: Diagnosis not present

## 2021-03-09 DIAGNOSIS — N186 End stage renal disease: Secondary | ICD-10-CM | POA: Diagnosis not present

## 2021-03-11 DIAGNOSIS — Z992 Dependence on renal dialysis: Secondary | ICD-10-CM | POA: Diagnosis not present

## 2021-03-11 DIAGNOSIS — N186 End stage renal disease: Secondary | ICD-10-CM | POA: Diagnosis not present

## 2021-03-11 DIAGNOSIS — E1122 Type 2 diabetes mellitus with diabetic chronic kidney disease: Secondary | ICD-10-CM | POA: Diagnosis not present

## 2021-03-12 DIAGNOSIS — Z992 Dependence on renal dialysis: Secondary | ICD-10-CM | POA: Diagnosis not present

## 2021-03-12 DIAGNOSIS — A4101 Sepsis due to Methicillin susceptible Staphylococcus aureus: Secondary | ICD-10-CM | POA: Diagnosis not present

## 2021-03-12 DIAGNOSIS — M7022 Olecranon bursitis, left elbow: Secondary | ICD-10-CM | POA: Diagnosis not present

## 2021-03-12 DIAGNOSIS — A0472 Enterocolitis due to Clostridium difficile, not specified as recurrent: Secondary | ICD-10-CM | POA: Diagnosis not present

## 2021-03-12 DIAGNOSIS — N186 End stage renal disease: Secondary | ICD-10-CM | POA: Diagnosis not present

## 2021-03-12 DIAGNOSIS — D631 Anemia in chronic kidney disease: Secondary | ICD-10-CM | POA: Diagnosis not present

## 2021-03-12 DIAGNOSIS — E1122 Type 2 diabetes mellitus with diabetic chronic kidney disease: Secondary | ICD-10-CM | POA: Diagnosis not present

## 2021-03-12 DIAGNOSIS — I120 Hypertensive chronic kidney disease with stage 5 chronic kidney disease or end stage renal disease: Secondary | ICD-10-CM | POA: Diagnosis not present

## 2021-03-12 DIAGNOSIS — N2581 Secondary hyperparathyroidism of renal origin: Secondary | ICD-10-CM | POA: Diagnosis not present

## 2021-03-12 DIAGNOSIS — I330 Acute and subacute infective endocarditis: Secondary | ICD-10-CM | POA: Diagnosis not present

## 2021-03-12 DIAGNOSIS — I2690 Septic pulmonary embolism without acute cor pulmonale: Secondary | ICD-10-CM | POA: Diagnosis not present

## 2021-03-13 ENCOUNTER — Other Ambulatory Visit: Payer: Self-pay

## 2021-03-13 ENCOUNTER — Ambulatory Visit (INDEPENDENT_AMBULATORY_CARE_PROVIDER_SITE_OTHER): Payer: Medicare HMO | Admitting: Internal Medicine

## 2021-03-13 ENCOUNTER — Encounter: Payer: Self-pay | Admitting: Internal Medicine

## 2021-03-13 VITALS — BP 169/79 | HR 82 | Temp 98.4°F

## 2021-03-13 DIAGNOSIS — N186 End stage renal disease: Secondary | ICD-10-CM | POA: Diagnosis not present

## 2021-03-13 DIAGNOSIS — M7022 Olecranon bursitis, left elbow: Secondary | ICD-10-CM | POA: Diagnosis not present

## 2021-03-13 DIAGNOSIS — I2690 Septic pulmonary embolism without acute cor pulmonale: Secondary | ICD-10-CM | POA: Diagnosis not present

## 2021-03-13 DIAGNOSIS — A0472 Enterocolitis due to Clostridium difficile, not specified as recurrent: Secondary | ICD-10-CM | POA: Diagnosis not present

## 2021-03-13 DIAGNOSIS — D631 Anemia in chronic kidney disease: Secondary | ICD-10-CM | POA: Diagnosis not present

## 2021-03-13 DIAGNOSIS — R7881 Bacteremia: Secondary | ICD-10-CM

## 2021-03-13 DIAGNOSIS — I330 Acute and subacute infective endocarditis: Secondary | ICD-10-CM | POA: Diagnosis not present

## 2021-03-13 DIAGNOSIS — B9561 Methicillin susceptible Staphylococcus aureus infection as the cause of diseases classified elsewhere: Secondary | ICD-10-CM | POA: Diagnosis not present

## 2021-03-13 DIAGNOSIS — E1122 Type 2 diabetes mellitus with diabetic chronic kidney disease: Secondary | ICD-10-CM | POA: Diagnosis not present

## 2021-03-13 DIAGNOSIS — I120 Hypertensive chronic kidney disease with stage 5 chronic kidney disease or end stage renal disease: Secondary | ICD-10-CM | POA: Diagnosis not present

## 2021-03-13 DIAGNOSIS — A4101 Sepsis due to Methicillin susceptible Staphylococcus aureus: Secondary | ICD-10-CM | POA: Diagnosis not present

## 2021-03-13 NOTE — Assessment & Plan Note (Signed)
Resolved, no new concerns.  Follow up as needed.

## 2021-03-13 NOTE — Progress Notes (Signed)
   Subjective:    Patient ID: Ryan Wells, male    DOB: 28-Sep-1955, 65 y.o.   MRN: [id number]  HPI He is here for hsfu He has a history of ESRD on IHD and hospitalized in June for MRSA bacteremia.  He was on vancomycin with dialysis and completed 6 weeks of treatment.  He missed his previous follow up appointment. His main complaint is back and leg pain.  He describes feeling of cold in his legs. Asking about xrays of his back.  No fever, no chills.  Also had equivocal C diff testing and not having any diarrhea at this time.    Review of Systems  Constitutional:  Negative for chills and fever.  Gastrointestinal:  Negative for diarrhea.  Skin:  Negative for rash.      Objective:   Physical Exam Eyes:     General: No scleral icterus. Pulmonary:     Effort: Pulmonary effort is normal.  Skin:    Findings: No rash.  Neurological:     Mental Status: He is alert.  Psychiatric:        Mood and Affect: Mood normal.          Assessment & Plan:

## 2021-03-13 NOTE — Assessment & Plan Note (Signed)
No concerns with diarrhea at this time.  No further treatment indicated.

## 2021-03-14 ENCOUNTER — Encounter (HOSPITAL_BASED_OUTPATIENT_CLINIC_OR_DEPARTMENT_OTHER): Payer: Self-pay | Admitting: Obstetrics and Gynecology

## 2021-03-14 ENCOUNTER — Emergency Department (HOSPITAL_BASED_OUTPATIENT_CLINIC_OR_DEPARTMENT_OTHER): Payer: Medicare HMO

## 2021-03-14 ENCOUNTER — Other Ambulatory Visit: Payer: Self-pay

## 2021-03-14 ENCOUNTER — Inpatient Hospital Stay (HOSPITAL_BASED_OUTPATIENT_CLINIC_OR_DEPARTMENT_OTHER)
Admission: EM | Admit: 2021-03-14 | Discharge: 2021-04-07 | DRG: 637 | Disposition: A | Discharge status: Skilled Nursing Facility | Payer: Medicare HMO | Attending: Internal Medicine | Admitting: Internal Medicine

## 2021-03-14 DIAGNOSIS — S0512XA Contusion of eyeball and orbital tissues, left eye, initial encounter: Secondary | ICD-10-CM | POA: Diagnosis not present

## 2021-03-14 DIAGNOSIS — M4807 Spinal stenosis, lumbosacral region: Secondary | ICD-10-CM | POA: Diagnosis not present

## 2021-03-14 DIAGNOSIS — E8779 Other fluid overload: Secondary | ICD-10-CM | POA: Diagnosis not present

## 2021-03-14 DIAGNOSIS — D631 Anemia in chronic kidney disease: Secondary | ICD-10-CM | POA: Diagnosis present

## 2021-03-14 DIAGNOSIS — K5900 Constipation, unspecified: Secondary | ICD-10-CM | POA: Diagnosis not present

## 2021-03-14 DIAGNOSIS — R9431 Abnormal electrocardiogram [ECG] [EKG]: Secondary | ICD-10-CM

## 2021-03-14 DIAGNOSIS — D649 Anemia, unspecified: Secondary | ICD-10-CM | POA: Diagnosis not present

## 2021-03-14 DIAGNOSIS — M4627 Osteomyelitis of vertebra, lumbosacral region: Secondary | ICD-10-CM | POA: Diagnosis present

## 2021-03-14 DIAGNOSIS — Z841 Family history of disorders of kidney and ureter: Secondary | ICD-10-CM

## 2021-03-14 DIAGNOSIS — N186 End stage renal disease: Secondary | ICD-10-CM | POA: Diagnosis present

## 2021-03-14 DIAGNOSIS — D72829 Elevated white blood cell count, unspecified: Secondary | ICD-10-CM | POA: Diagnosis present

## 2021-03-14 DIAGNOSIS — Z87898 Personal history of other specified conditions: Secondary | ICD-10-CM | POA: Diagnosis not present

## 2021-03-14 DIAGNOSIS — E875 Hyperkalemia: Secondary | ICD-10-CM | POA: Diagnosis present

## 2021-03-14 DIAGNOSIS — M009 Pyogenic arthritis, unspecified: Secondary | ICD-10-CM | POA: Diagnosis present

## 2021-03-14 DIAGNOSIS — R52 Pain, unspecified: Secondary | ICD-10-CM

## 2021-03-14 DIAGNOSIS — E038 Other specified hypothyroidism: Secondary | ICD-10-CM | POA: Clinically undetermined

## 2021-03-14 DIAGNOSIS — E119 Type 2 diabetes mellitus without complications: Secondary | ICD-10-CM

## 2021-03-14 DIAGNOSIS — I120 Hypertensive chronic kidney disease with stage 5 chronic kidney disease or end stage renal disease: Secondary | ICD-10-CM | POA: Diagnosis not present

## 2021-03-14 DIAGNOSIS — J810 Acute pulmonary edema: Principal | ICD-10-CM | POA: Diagnosis present

## 2021-03-14 DIAGNOSIS — R778 Other specified abnormalities of plasma proteins: Secondary | ICD-10-CM

## 2021-03-14 DIAGNOSIS — S0083XA Contusion of other part of head, initial encounter: Secondary | ICD-10-CM | POA: Diagnosis present

## 2021-03-14 DIAGNOSIS — M79605 Pain in left leg: Secondary | ICD-10-CM | POA: Diagnosis not present

## 2021-03-14 DIAGNOSIS — N189 Chronic kidney disease, unspecified: Secondary | ICD-10-CM | POA: Diagnosis present

## 2021-03-14 DIAGNOSIS — Z992 Dependence on renal dialysis: Secondary | ICD-10-CM

## 2021-03-14 DIAGNOSIS — E1143 Type 2 diabetes mellitus with diabetic autonomic (poly)neuropathy: Secondary | ICD-10-CM | POA: Diagnosis present

## 2021-03-14 DIAGNOSIS — K611 Rectal abscess: Secondary | ICD-10-CM | POA: Diagnosis not present

## 2021-03-14 DIAGNOSIS — E1122 Type 2 diabetes mellitus with diabetic chronic kidney disease: Secondary | ICD-10-CM | POA: Diagnosis present

## 2021-03-14 DIAGNOSIS — M5127 Other intervertebral disc displacement, lumbosacral region: Secondary | ICD-10-CM | POA: Diagnosis not present

## 2021-03-14 DIAGNOSIS — R7989 Other specified abnormal findings of blood chemistry: Secondary | ICD-10-CM | POA: Diagnosis present

## 2021-03-14 DIAGNOSIS — G8929 Other chronic pain: Secondary | ICD-10-CM | POA: Diagnosis present

## 2021-03-14 DIAGNOSIS — X58XXXA Exposure to other specified factors, initial encounter: Secondary | ICD-10-CM | POA: Diagnosis present

## 2021-03-14 DIAGNOSIS — Z8619 Personal history of other infectious and parasitic diseases: Secondary | ICD-10-CM

## 2021-03-14 DIAGNOSIS — E039 Hypothyroidism, unspecified: Secondary | ICD-10-CM | POA: Clinically undetermined

## 2021-03-14 DIAGNOSIS — I248 Other forms of acute ischemic heart disease: Secondary | ICD-10-CM | POA: Diagnosis present

## 2021-03-14 DIAGNOSIS — L0291 Cutaneous abscess, unspecified: Secondary | ICD-10-CM

## 2021-03-14 DIAGNOSIS — M4647 Discitis, unspecified, lumbosacral region: Secondary | ICD-10-CM | POA: Diagnosis present

## 2021-03-14 DIAGNOSIS — J90 Pleural effusion, not elsewhere classified: Secondary | ICD-10-CM | POA: Diagnosis present

## 2021-03-14 DIAGNOSIS — H1089 Other conjunctivitis: Secondary | ICD-10-CM | POA: Diagnosis present

## 2021-03-14 DIAGNOSIS — M79606 Pain in leg, unspecified: Secondary | ICD-10-CM | POA: Diagnosis present

## 2021-03-14 DIAGNOSIS — M0088 Arthritis due to other bacteria, vertebrae: Secondary | ICD-10-CM | POA: Diagnosis not present

## 2021-03-14 DIAGNOSIS — M549 Dorsalgia, unspecified: Secondary | ICD-10-CM | POA: Diagnosis not present

## 2021-03-14 DIAGNOSIS — M47817 Spondylosis without myelopathy or radiculopathy, lumbosacral region: Secondary | ICD-10-CM | POA: Diagnosis not present

## 2021-03-14 DIAGNOSIS — Z87891 Personal history of nicotine dependence: Secondary | ICD-10-CM

## 2021-03-14 DIAGNOSIS — E1129 Type 2 diabetes mellitus with other diabetic kidney complication: Secondary | ICD-10-CM | POA: Diagnosis not present

## 2021-03-14 DIAGNOSIS — I1311 Hypertensive heart and chronic kidney disease without heart failure, with stage 5 chronic kidney disease, or end stage renal disease: Secondary | ICD-10-CM | POA: Diagnosis present

## 2021-03-14 DIAGNOSIS — M47816 Spondylosis without myelopathy or radiculopathy, lumbar region: Secondary | ICD-10-CM | POA: Diagnosis not present

## 2021-03-14 DIAGNOSIS — M48061 Spinal stenosis, lumbar region without neurogenic claudication: Secondary | ICD-10-CM | POA: Diagnosis present

## 2021-03-14 DIAGNOSIS — I959 Hypotension, unspecified: Secondary | ICD-10-CM | POA: Diagnosis not present

## 2021-03-14 DIAGNOSIS — R404 Transient alteration of awareness: Secondary | ICD-10-CM | POA: Diagnosis not present

## 2021-03-14 DIAGNOSIS — I252 Old myocardial infarction: Secondary | ICD-10-CM | POA: Diagnosis not present

## 2021-03-14 DIAGNOSIS — Z20822 Contact with and (suspected) exposure to covid-19: Secondary | ICD-10-CM | POA: Diagnosis present

## 2021-03-14 DIAGNOSIS — A419 Sepsis, unspecified organism: Secondary | ICD-10-CM | POA: Diagnosis not present

## 2021-03-14 DIAGNOSIS — N250 Renal osteodystrophy: Secondary | ICD-10-CM | POA: Diagnosis not present

## 2021-03-14 DIAGNOSIS — N2581 Secondary hyperparathyroidism of renal origin: Secondary | ICD-10-CM | POA: Diagnosis not present

## 2021-03-14 DIAGNOSIS — Z79899 Other long term (current) drug therapy: Secondary | ICD-10-CM

## 2021-03-14 DIAGNOSIS — M5126 Other intervertebral disc displacement, lumbar region: Secondary | ICD-10-CM | POA: Diagnosis not present

## 2021-03-14 DIAGNOSIS — L97529 Non-pressure chronic ulcer of other part of left foot with unspecified severity: Secondary | ICD-10-CM | POA: Diagnosis present

## 2021-03-14 DIAGNOSIS — Z833 Family history of diabetes mellitus: Secondary | ICD-10-CM

## 2021-03-14 DIAGNOSIS — H1032 Unspecified acute conjunctivitis, left eye: Secondary | ICD-10-CM

## 2021-03-14 DIAGNOSIS — I517 Cardiomegaly: Secondary | ICD-10-CM | POA: Diagnosis not present

## 2021-03-14 DIAGNOSIS — M79604 Pain in right leg: Secondary | ICD-10-CM | POA: Diagnosis not present

## 2021-03-14 DIAGNOSIS — L03213 Periorbital cellulitis: Secondary | ICD-10-CM | POA: Diagnosis present

## 2021-03-14 DIAGNOSIS — E11621 Type 2 diabetes mellitus with foot ulcer: Secondary | ICD-10-CM | POA: Diagnosis present

## 2021-03-14 DIAGNOSIS — K3184 Gastroparesis: Secondary | ICD-10-CM | POA: Diagnosis not present

## 2021-03-14 DIAGNOSIS — M898X9 Other specified disorders of bone, unspecified site: Secondary | ICD-10-CM | POA: Diagnosis present

## 2021-03-14 DIAGNOSIS — E1169 Type 2 diabetes mellitus with other specified complication: Secondary | ICD-10-CM | POA: Diagnosis present

## 2021-03-14 DIAGNOSIS — M545 Low back pain, unspecified: Secondary | ICD-10-CM | POA: Diagnosis present

## 2021-03-14 DIAGNOSIS — Z7401 Bed confinement status: Secondary | ICD-10-CM | POA: Diagnosis not present

## 2021-03-14 DIAGNOSIS — I10 Essential (primary) hypertension: Secondary | ICD-10-CM | POA: Diagnosis not present

## 2021-03-14 DIAGNOSIS — Z86711 Personal history of pulmonary embolism: Secondary | ICD-10-CM

## 2021-03-14 DIAGNOSIS — K6812 Psoas muscle abscess: Secondary | ICD-10-CM | POA: Diagnosis present

## 2021-03-14 DIAGNOSIS — Z8249 Family history of ischemic heart disease and other diseases of the circulatory system: Secondary | ICD-10-CM

## 2021-03-14 LAB — CBC WITH DIFFERENTIAL/PLATELET
Abs Immature Granulocytes: 0.12 10*3/uL — ABNORMAL HIGH (ref 0.00–0.07)
Basophils Absolute: 0 10*3/uL (ref 0.0–0.1)
Basophils Relative: 0 %
Eosinophils Absolute: 0 10*3/uL (ref 0.0–0.5)
Eosinophils Relative: 0 %
HCT: 32 % — ABNORMAL LOW (ref 39.0–52.0)
Hemoglobin: 10 g/dL — ABNORMAL LOW (ref 13.0–17.0)
Immature Granulocytes: 1 %
Lymphocytes Relative: 8 %
Lymphs Abs: 1.3 10*3/uL (ref 0.7–4.0)
MCH: 29.9 pg (ref 26.0–34.0)
MCHC: 31.3 g/dL (ref 30.0–36.0)
MCV: 95.8 fL (ref 80.0–100.0)
Monocytes Absolute: 0.9 10*3/uL (ref 0.1–1.0)
Monocytes Relative: 6 %
Neutro Abs: 12.8 10*3/uL — ABNORMAL HIGH (ref 1.7–7.7)
Neutrophils Relative %: 85 %
Platelets: 212 10*3/uL (ref 150–400)
RBC: 3.34 MIL/uL — ABNORMAL LOW (ref 4.22–5.81)
RDW: 18.5 % — ABNORMAL HIGH (ref 11.5–15.5)
WBC: 15.1 10*3/uL — ABNORMAL HIGH (ref 4.0–10.5)
nRBC: 0.1 % (ref 0.0–0.2)

## 2021-03-14 LAB — COMPREHENSIVE METABOLIC PANEL
ALT: 9 U/L (ref 0–44)
AST: 21 U/L (ref 15–41)
Albumin: 3.4 g/dL — ABNORMAL LOW (ref 3.5–5.0)
Alkaline Phosphatase: 107 U/L (ref 38–126)
Anion gap: 20 — ABNORMAL HIGH (ref 5–15)
BUN: 63 mg/dL — ABNORMAL HIGH (ref 8–23)
CO2: 25 mmol/L (ref 22–32)
Calcium: 7.9 mg/dL — ABNORMAL LOW (ref 8.9–10.3)
Chloride: 94 mmol/L — ABNORMAL LOW (ref 98–111)
Creatinine, Ser: 8.5 mg/dL — ABNORMAL HIGH (ref 0.61–1.24)
GFR, Estimated: 6 mL/min — ABNORMAL LOW (ref >60)
Glucose, Bld: 173 mg/dL — ABNORMAL HIGH (ref 70–99)
Potassium: 4.4 mmol/L (ref 3.5–5.1)
Sodium: 139 mmol/L (ref 135–145)
Total Bilirubin: 1.2 mg/dL (ref 0.3–1.2)
Total Protein: 7.1 g/dL (ref 6.5–8.1)

## 2021-03-14 IMAGING — DX DG CHEST 1V
1 series · 1 of 1 positions shown · non-contrast
Comparison: Chest radiograph dated [DATE].

CLINICAL DATA: 65-year-old male with generalized pain.

EXAM:
CHEST  1 VIEW

[chest]
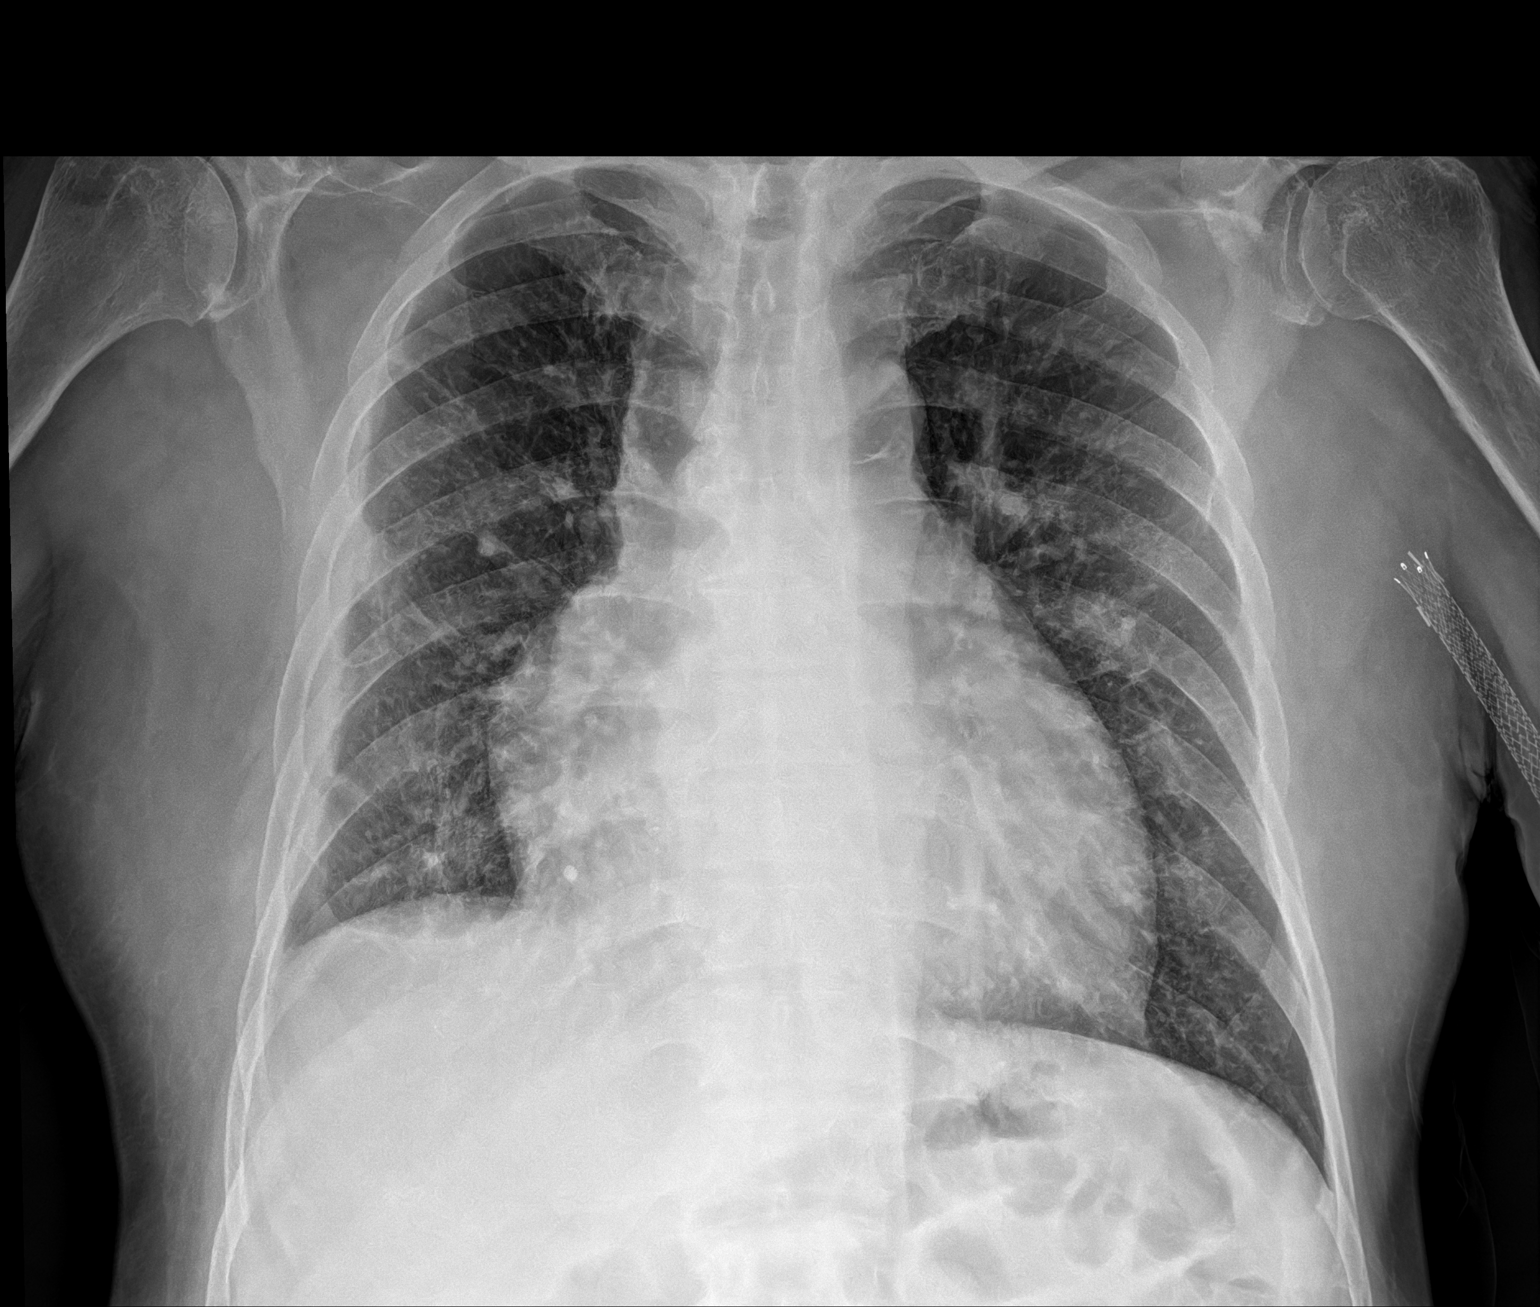

[1 of 1 positions shown; findings below may reference images not displayed]

FINDINGS: Cardiomegaly with vascular congestion. Focal area of subpleural
nodularity along the lateral right mid lung field likely corresponds
to the previously seen cavitary lesion. Overall the previously seen
nodules are less conspicuous on today's study. There is a small
right pleural effusion or pleural thickening. No pneumothorax. No
acute osseous pathology. Degenerative changes of the spine.
Partially visualized cervical ACDF and left axillary vascular stent.
No acute osseous pathology.
IMPRESSION: 1. Cardiomegaly with vascular congestion.
2. Focal area of subpleural nodularity along the right mid lung
field likely corresponds to the previously seen cavitary lesion.
3. Small right pleural effusion.

## 2021-03-14 NOTE — ED Triage Notes (Signed)
Patient reports to the ER for  Patient reports he missed dialysis today and Monday. Patient reports he has pain in his body and did not go to dialysis die to the pain

## 2021-03-15 ENCOUNTER — Emergency Department (HOSPITAL_BASED_OUTPATIENT_CLINIC_OR_DEPARTMENT_OTHER): Payer: Medicare HMO

## 2021-03-15 ENCOUNTER — Other Ambulatory Visit: Payer: Self-pay

## 2021-03-15 ENCOUNTER — Encounter (HOSPITAL_BASED_OUTPATIENT_CLINIC_OR_DEPARTMENT_OTHER): Payer: Self-pay | Admitting: Emergency Medicine

## 2021-03-15 DIAGNOSIS — Z87898 Personal history of other specified conditions: Secondary | ICD-10-CM

## 2021-03-15 DIAGNOSIS — J810 Acute pulmonary edema: Secondary | ICD-10-CM | POA: Diagnosis present

## 2021-03-15 DIAGNOSIS — E1122 Type 2 diabetes mellitus with diabetic chronic kidney disease: Secondary | ICD-10-CM | POA: Diagnosis present

## 2021-03-15 DIAGNOSIS — Z992 Dependence on renal dialysis: Secondary | ICD-10-CM

## 2021-03-15 DIAGNOSIS — M545 Low back pain, unspecified: Secondary | ICD-10-CM | POA: Diagnosis present

## 2021-03-15 DIAGNOSIS — G8929 Other chronic pain: Secondary | ICD-10-CM | POA: Diagnosis present

## 2021-03-15 DIAGNOSIS — E038 Other specified hypothyroidism: Secondary | ICD-10-CM

## 2021-03-15 DIAGNOSIS — D72829 Elevated white blood cell count, unspecified: Secondary | ICD-10-CM

## 2021-03-15 DIAGNOSIS — E11621 Type 2 diabetes mellitus with foot ulcer: Secondary | ICD-10-CM | POA: Diagnosis present

## 2021-03-15 DIAGNOSIS — E119 Type 2 diabetes mellitus without complications: Secondary | ICD-10-CM

## 2021-03-15 DIAGNOSIS — K6812 Psoas muscle abscess: Secondary | ICD-10-CM | POA: Diagnosis present

## 2021-03-15 DIAGNOSIS — N186 End stage renal disease: Secondary | ICD-10-CM | POA: Diagnosis present

## 2021-03-15 DIAGNOSIS — K611 Rectal abscess: Secondary | ICD-10-CM | POA: Diagnosis not present

## 2021-03-15 DIAGNOSIS — S0083XA Contusion of other part of head, initial encounter: Secondary | ICD-10-CM | POA: Diagnosis not present

## 2021-03-15 DIAGNOSIS — E875 Hyperkalemia: Secondary | ICD-10-CM | POA: Diagnosis present

## 2021-03-15 DIAGNOSIS — M79605 Pain in left leg: Secondary | ICD-10-CM

## 2021-03-15 DIAGNOSIS — L03213 Periorbital cellulitis: Secondary | ICD-10-CM | POA: Diagnosis present

## 2021-03-15 DIAGNOSIS — M79604 Pain in right leg: Secondary | ICD-10-CM

## 2021-03-15 DIAGNOSIS — D631 Anemia in chronic kidney disease: Secondary | ICD-10-CM | POA: Diagnosis present

## 2021-03-15 DIAGNOSIS — M47817 Spondylosis without myelopathy or radiculopathy, lumbosacral region: Secondary | ICD-10-CM | POA: Diagnosis not present

## 2021-03-15 DIAGNOSIS — J90 Pleural effusion, not elsewhere classified: Secondary | ICD-10-CM

## 2021-03-15 DIAGNOSIS — M009 Pyogenic arthritis, unspecified: Secondary | ICD-10-CM | POA: Diagnosis present

## 2021-03-15 DIAGNOSIS — M5126 Other intervertebral disc displacement, lumbar region: Secondary | ICD-10-CM | POA: Diagnosis not present

## 2021-03-15 DIAGNOSIS — M79606 Pain in leg, unspecified: Secondary | ICD-10-CM | POA: Diagnosis present

## 2021-03-15 DIAGNOSIS — E8770 Fluid overload, unspecified: Secondary | ICD-10-CM | POA: Insufficient documentation

## 2021-03-15 DIAGNOSIS — R778 Other specified abnormalities of plasma proteins: Secondary | ICD-10-CM

## 2021-03-15 DIAGNOSIS — A419 Sepsis, unspecified organism: Secondary | ICD-10-CM | POA: Diagnosis not present

## 2021-03-15 DIAGNOSIS — S0512XA Contusion of eyeball and orbital tissues, left eye, initial encounter: Secondary | ICD-10-CM | POA: Diagnosis not present

## 2021-03-15 DIAGNOSIS — M4647 Discitis, unspecified, lumbosacral region: Secondary | ICD-10-CM | POA: Diagnosis present

## 2021-03-15 DIAGNOSIS — I252 Old myocardial infarction: Secondary | ICD-10-CM | POA: Diagnosis not present

## 2021-03-15 DIAGNOSIS — H1089 Other conjunctivitis: Secondary | ICD-10-CM | POA: Diagnosis present

## 2021-03-15 DIAGNOSIS — M5127 Other intervertebral disc displacement, lumbosacral region: Secondary | ICD-10-CM | POA: Diagnosis not present

## 2021-03-15 DIAGNOSIS — Z8619 Personal history of other infectious and parasitic diseases: Secondary | ICD-10-CM | POA: Diagnosis not present

## 2021-03-15 DIAGNOSIS — M48061 Spinal stenosis, lumbar region without neurogenic claudication: Secondary | ICD-10-CM | POA: Diagnosis not present

## 2021-03-15 DIAGNOSIS — I1311 Hypertensive heart and chronic kidney disease without heart failure, with stage 5 chronic kidney disease, or end stage renal disease: Secondary | ICD-10-CM | POA: Diagnosis present

## 2021-03-15 DIAGNOSIS — M47816 Spondylosis without myelopathy or radiculopathy, lumbar region: Secondary | ICD-10-CM | POA: Diagnosis not present

## 2021-03-15 DIAGNOSIS — E1143 Type 2 diabetes mellitus with diabetic autonomic (poly)neuropathy: Secondary | ICD-10-CM | POA: Diagnosis present

## 2021-03-15 DIAGNOSIS — L97529 Non-pressure chronic ulcer of other part of left foot with unspecified severity: Secondary | ICD-10-CM | POA: Diagnosis present

## 2021-03-15 DIAGNOSIS — I248 Other forms of acute ischemic heart disease: Secondary | ICD-10-CM | POA: Diagnosis present

## 2021-03-15 DIAGNOSIS — E1169 Type 2 diabetes mellitus with other specified complication: Secondary | ICD-10-CM | POA: Diagnosis present

## 2021-03-15 DIAGNOSIS — Z20822 Contact with and (suspected) exposure to covid-19: Secondary | ICD-10-CM | POA: Diagnosis present

## 2021-03-15 DIAGNOSIS — X58XXXA Exposure to other specified factors, initial encounter: Secondary | ICD-10-CM | POA: Diagnosis present

## 2021-03-15 DIAGNOSIS — M4627 Osteomyelitis of vertebra, lumbosacral region: Secondary | ICD-10-CM | POA: Diagnosis present

## 2021-03-15 LAB — RENAL FUNCTION PANEL
Albumin: 2.4 g/dL — ABNORMAL LOW (ref 3.5–5.0)
Anion gap: 20 — ABNORMAL HIGH (ref 5–15)
BUN: 70 mg/dL — ABNORMAL HIGH (ref 8–23)
CO2: 27 mmol/L (ref 22–32)
Calcium: 7.6 mg/dL — ABNORMAL LOW (ref 8.9–10.3)
Chloride: 95 mmol/L — ABNORMAL LOW (ref 98–111)
Creatinine, Ser: 9.6 mg/dL — ABNORMAL HIGH (ref 0.61–1.24)
GFR, Estimated: 6 mL/min — ABNORMAL LOW (ref >60)
Glucose, Bld: 95 mg/dL (ref 70–99)
Phosphorus: 9 mg/dL — ABNORMAL HIGH (ref 2.5–4.6)
Potassium: 4 mmol/L (ref 3.5–5.1)
Sodium: 142 mmol/L (ref 135–145)

## 2021-03-15 LAB — SEDIMENTATION RATE: Sed Rate: 54 mm/hr — ABNORMAL HIGH (ref 0–16)

## 2021-03-15 LAB — TROPONIN I (HIGH SENSITIVITY)
Troponin I (High Sensitivity): 84 ng/L — ABNORMAL HIGH (ref <18)
Troponin I (High Sensitivity): 101 ng/L (ref <18)

## 2021-03-15 LAB — RESP PANEL BY RT-PCR (FLU A&B, COVID) ARPGX2
Influenza A by PCR: NEGATIVE
Influenza B by PCR: NEGATIVE
SARS Coronavirus 2 by RT PCR: NEGATIVE

## 2021-03-15 LAB — C-REACTIVE PROTEIN: CRP: 22.1 mg/dL — ABNORMAL HIGH (ref <1.0)

## 2021-03-15 IMAGING — CT CT ORBITS W/O CM
3 of 7 series · 15 of 47 positions shown, 18 images · non-contrast
Comparison: None

CLINICAL DATA: 65-year-old male with ocular pain and swelling over
the left orbit. Facial trauma.

EXAM:
CT HEAD AND ORBITS WITHOUT CONTRAST
TECHNIQUE: Contiguous axial images were obtained from the base of the skull
through the vertex without contrast. Multidetector CT imaging of the
orbits was performed using the standard protocol without intravenous
contrast.

[Series 3: head bone · axial · 0.43mm/px · z∈[-418,-294]mm · 10 of 74 slices shown, 13 images]
[im 6/74  brain]
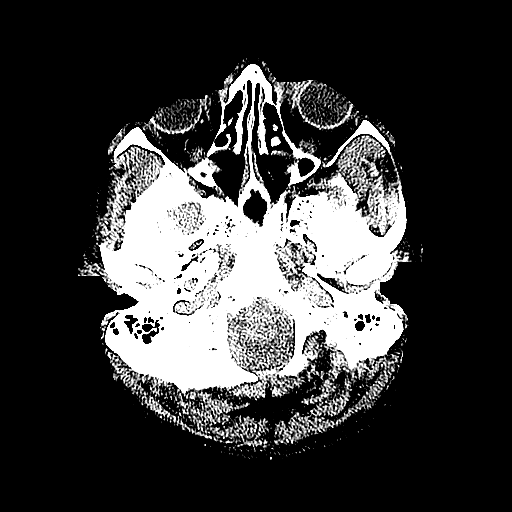
[im 6/74  bone]
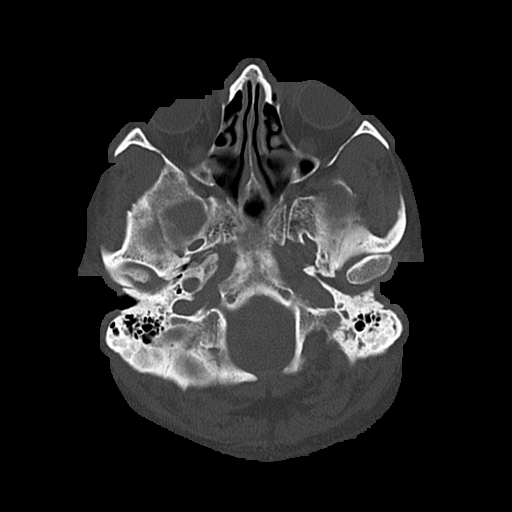
[im 11/74  bone]
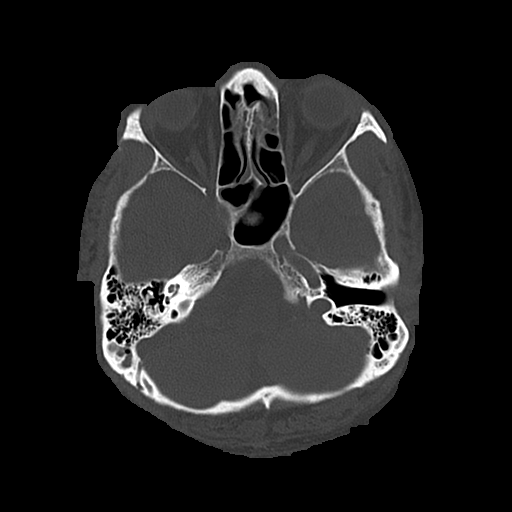
[im 21/74  bone]
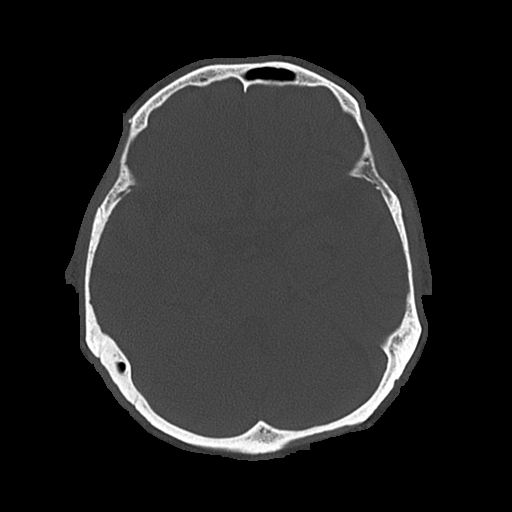
[im 27/74  bone]
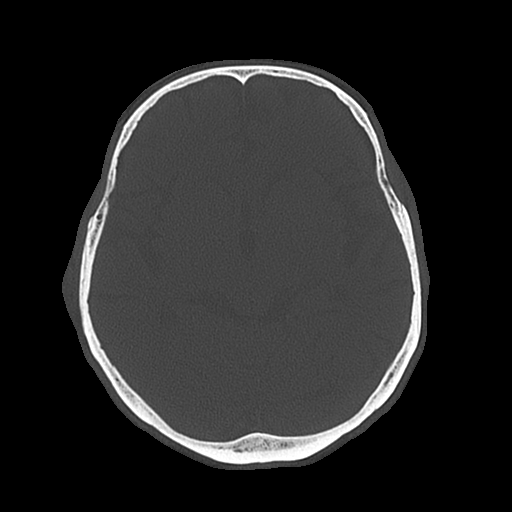
[im 32/74  brain]
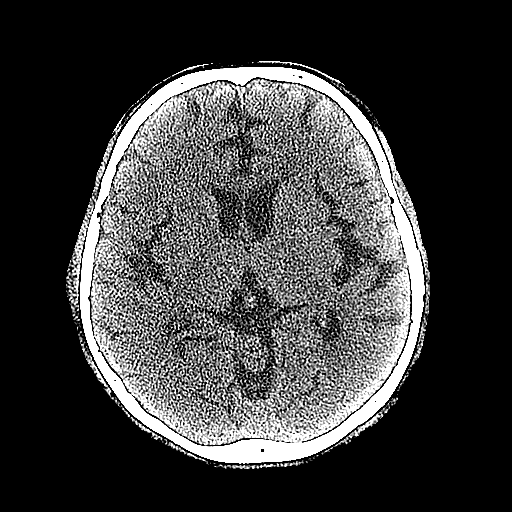
[im 32/74  bone]
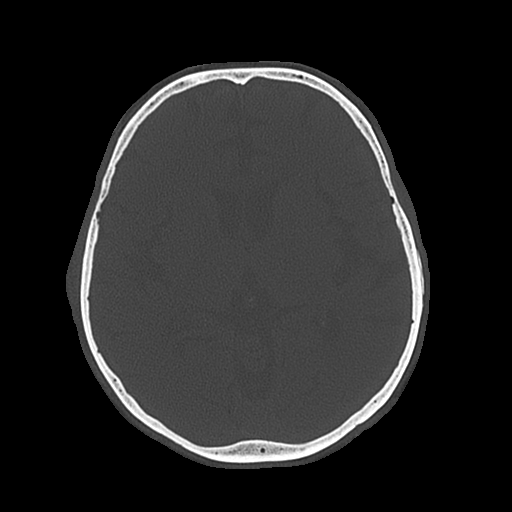
[im 42/74  bone]
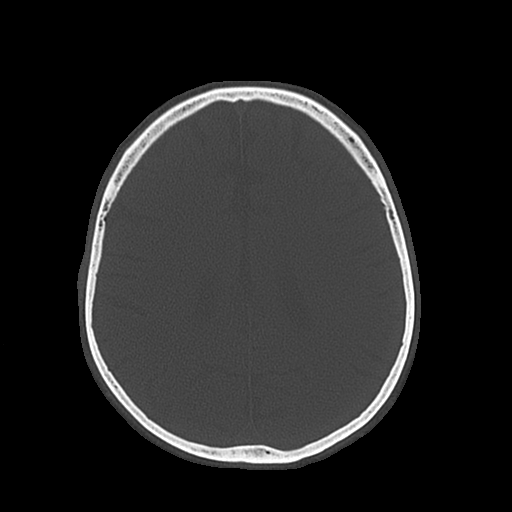
[im 47/74  bone]
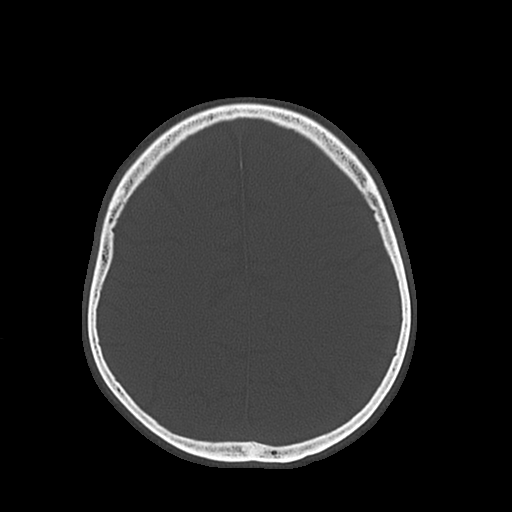
[im 53/74  bone]
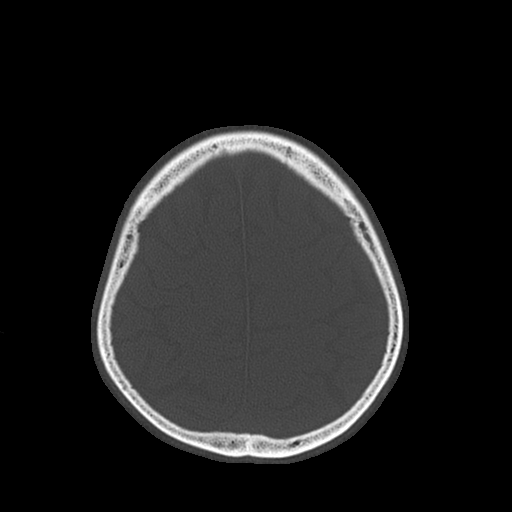
[im 63/74  brain]
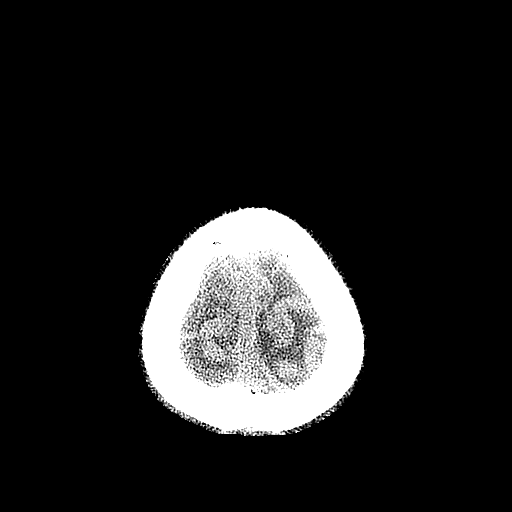
[im 63/74  bone]
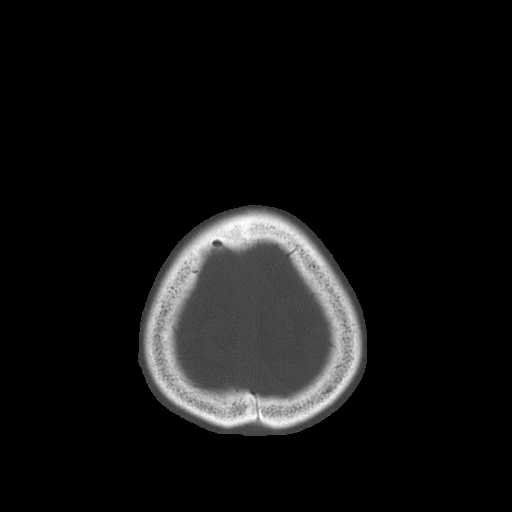
[im 68/74  bone]
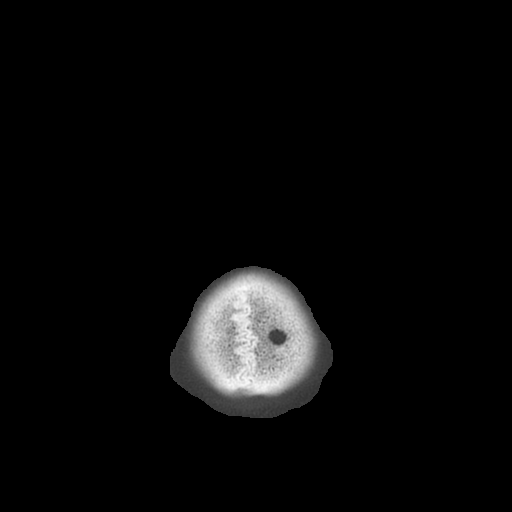

[Series 4: coronal soft · coronal · 0.31mm/px · 3 of 58 slices shown]
[im 14/58  bone]
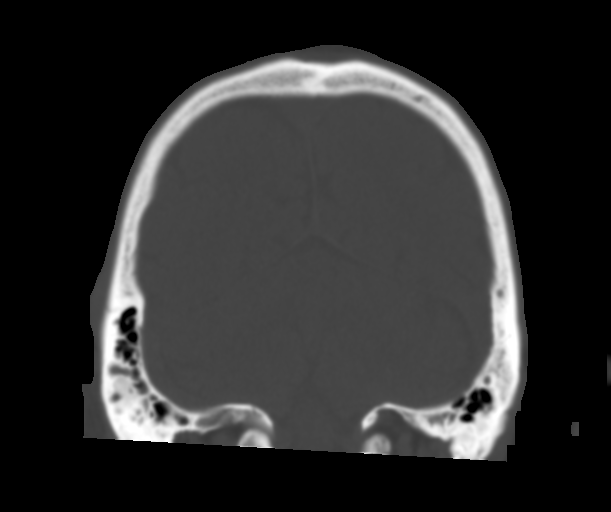
[im 27/58  bone]
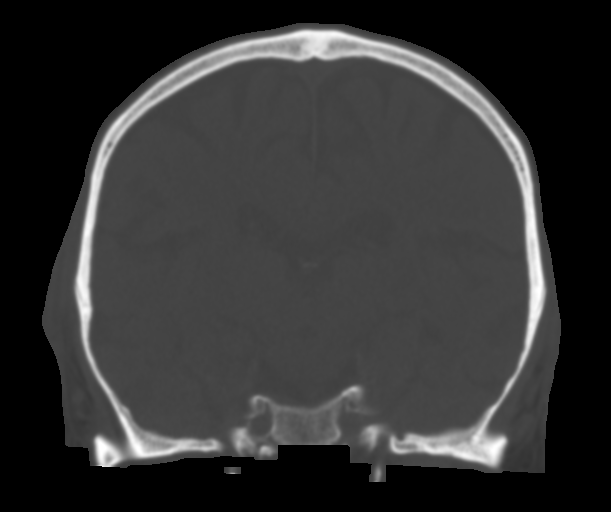
[im 41/58  bone]
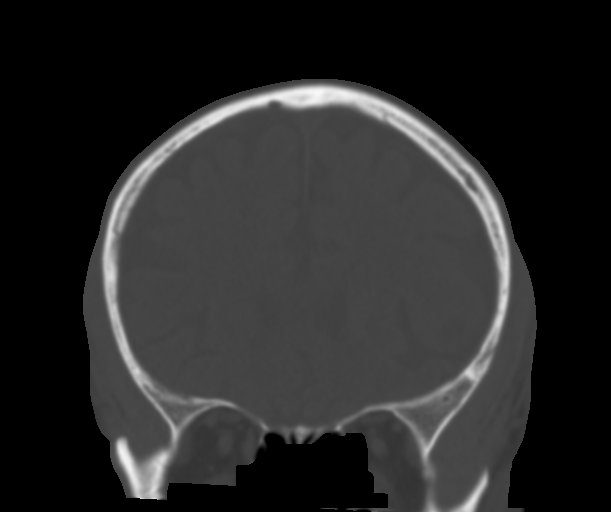

[Series 9: sagittal soft · sagittal · 0.22mm/px · 2 of 83 slices shown]
[im 28/83  bone]
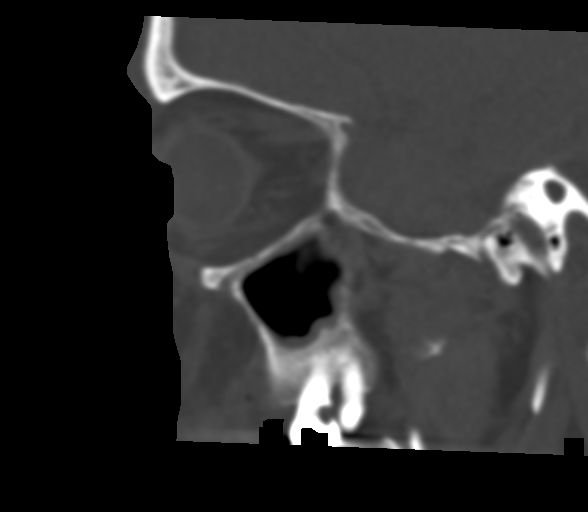
[im 55/83  bone]
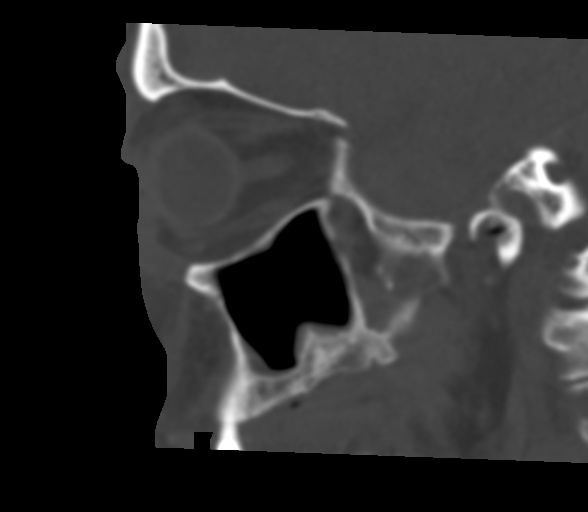

[15 of 47 positions shown; findings below may reference images not displayed]

FINDINGS: CT HEAD FINDINGS

Brain: There is mild age-related atrophy and chronic microvascular
ischemic changes. There is no acute intracranial hemorrhage. No mass
effect or midline shift. No extra-axial fluid collection.

Vascular: No hyperdense vessel or unexpected calcification.

Skull: Normal. Negative for fracture or focal lesion.

Other: None

CT ORBITS FINDINGS

Evaluation of this exam is limited in the absence of intravenous
contrast.

Orbits: The globes and retro-orbital fat are preserved. No acute or
traumatic injury.

Visualized sinuses: There is mild diffuse mucoperiosteal thickening
of paranasal sinuses. No air-fluid level. Mild bilateral mastoid
effusions.

Soft tissues: Left facial and periorbital soft tissue contusion. No
large hematoma.
IMPRESSION: 1. No acute intracranial pathology.
2. No acute/traumatic orbital pathology.
3. Left facial and periorbital soft tissue contusion.

## 2021-03-15 IMAGING — CT CT HEAD W/O CM
4 of 7 series · 16 of 47 positions shown, 17 images · non-contrast
Comparison: None

CLINICAL DATA: 65-year-old male with ocular pain and swelling over
the left orbit. Facial trauma.

EXAM:
CT HEAD AND ORBITS WITHOUT CONTRAST
TECHNIQUE: Contiguous axial images were obtained from the base of the skull
through the vertex without contrast. Multidetector CT imaging of the
orbits was performed using the standard protocol without intravenous
contrast.

[Series 2: head wo · axial · 0.43mm/px · z∈[-403,-313]mm · 4 of 30 slices shown, 5 images]
[im 6/30  brain]
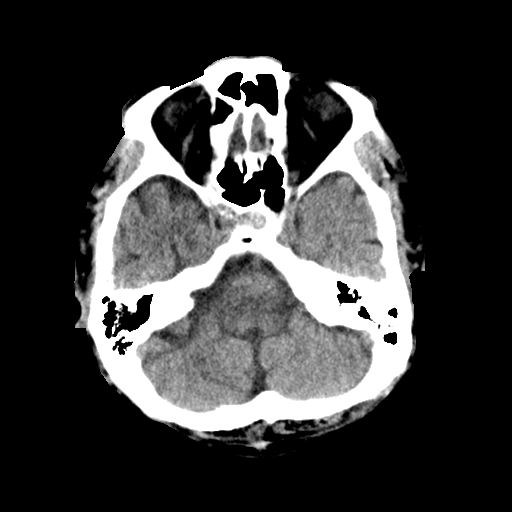
[im 6/30  bone]
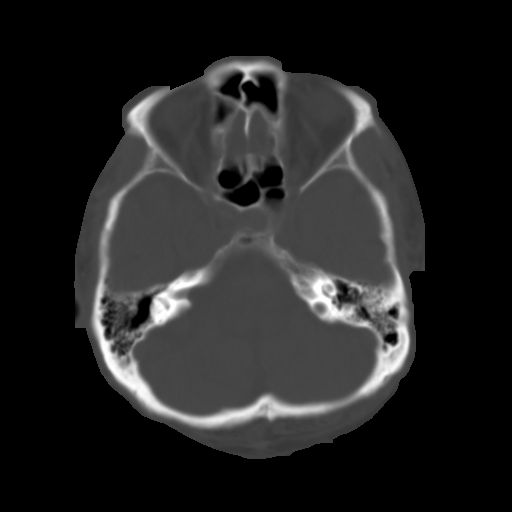
[im 12/30  brain]
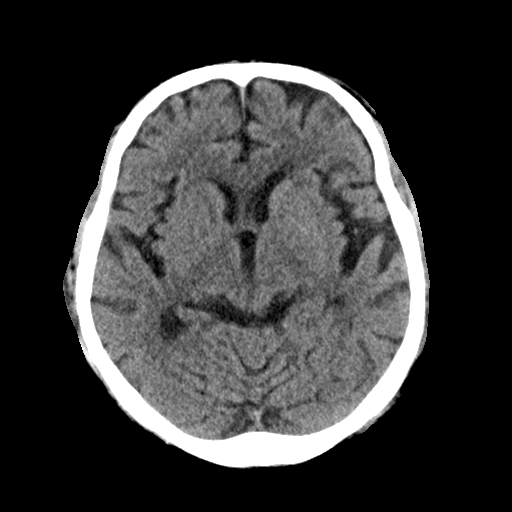
[im 18/30  brain]
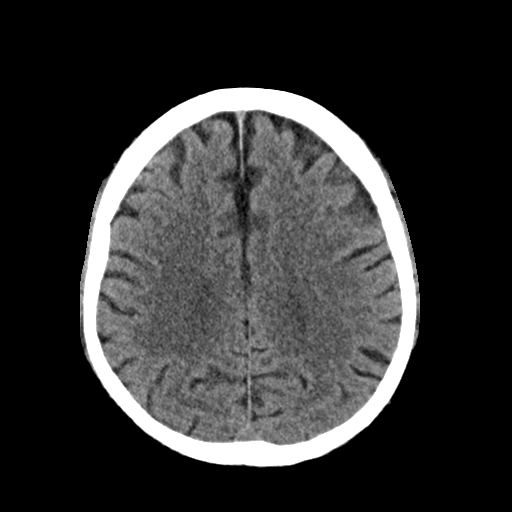
[im 24/30  brain]
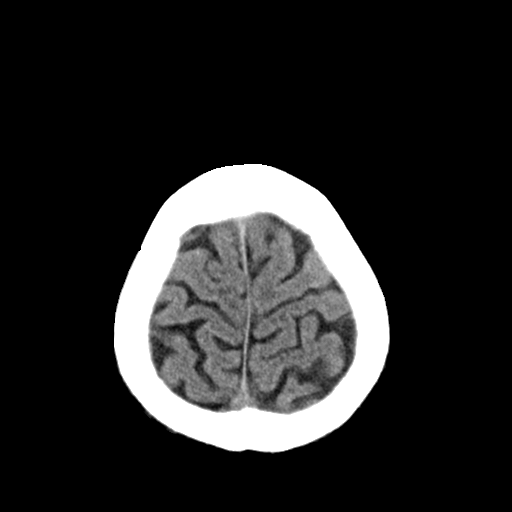

[Series 4: coronal soft · coronal · 0.31mm/px · 3 of 58 slices shown]
[im 14/58  brain]
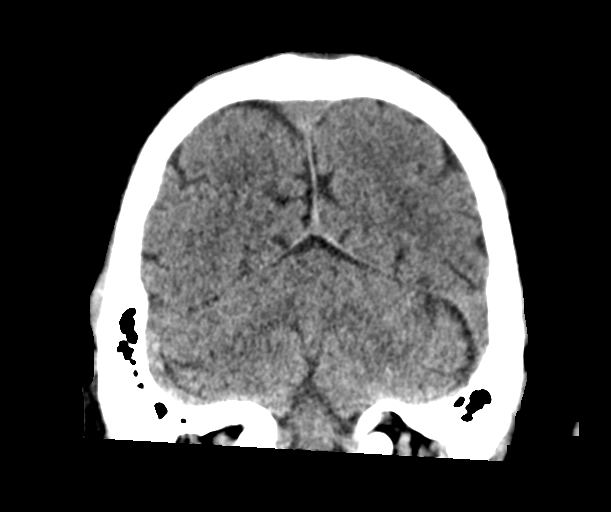
[im 27/58  brain]
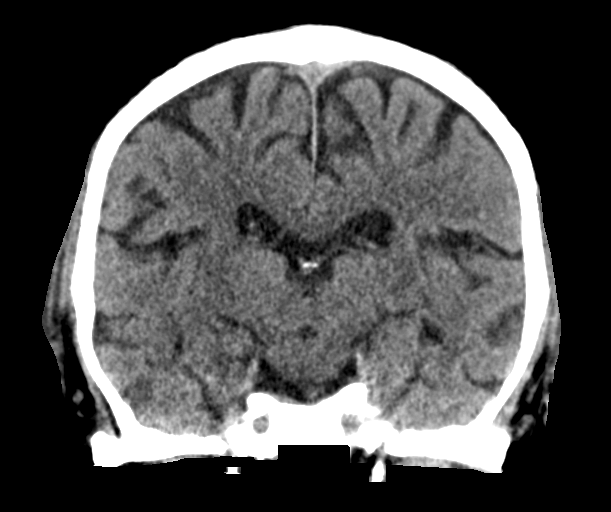
[im 41/58  brain]
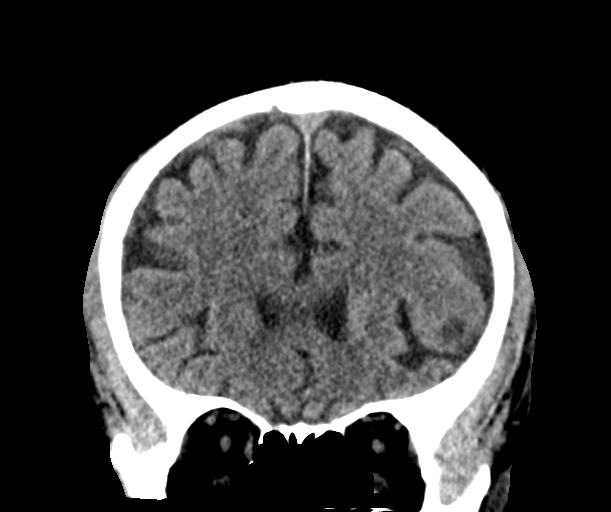

[Series 6: 1 orbits soft · axial · 0.37mm/px · z∈[-472,-402]mm · 7 of 47 slices shown]
[im 6/47  brain]
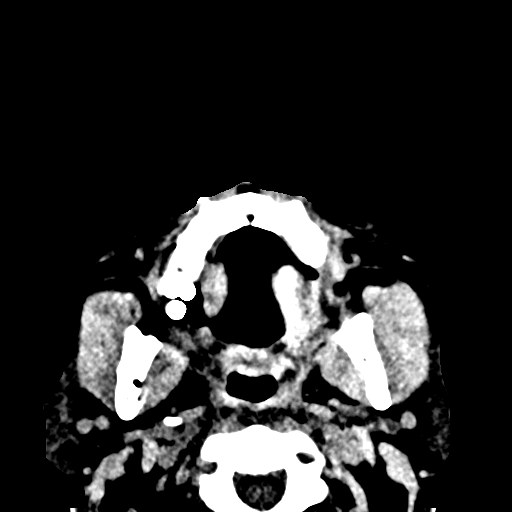
[im 12/47  brain]
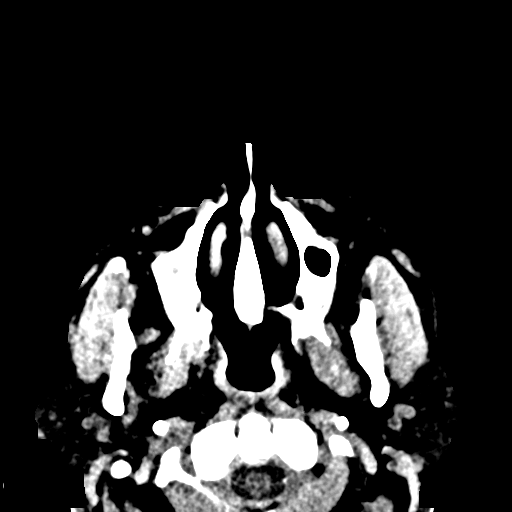
[im 18/47  brain]
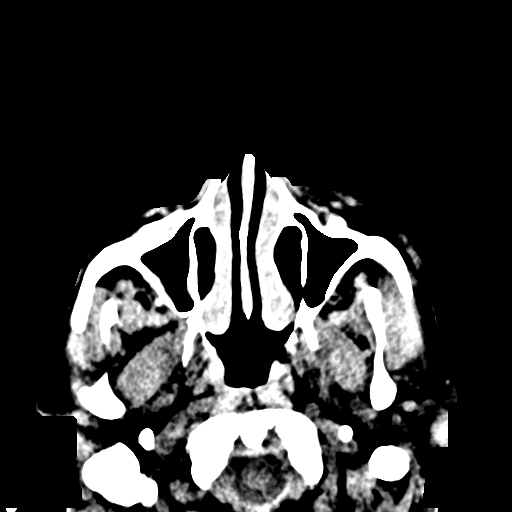
[im 24/47  brain]
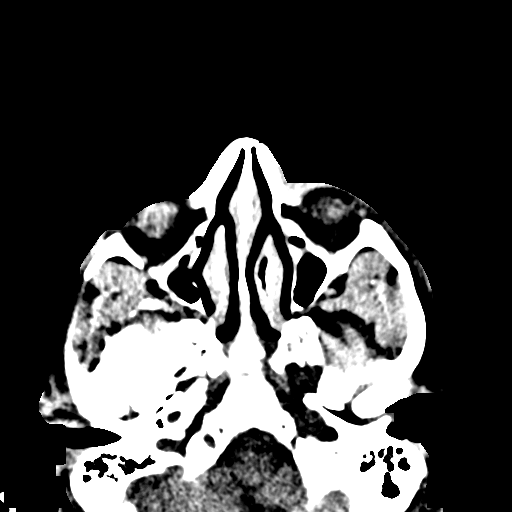
[im 29/47  brain]
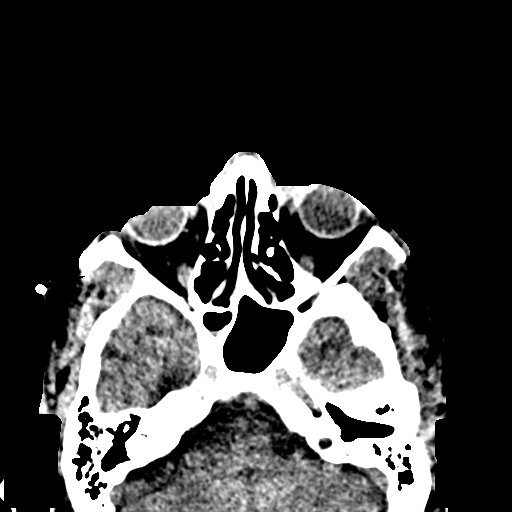
[im 35/47  brain]
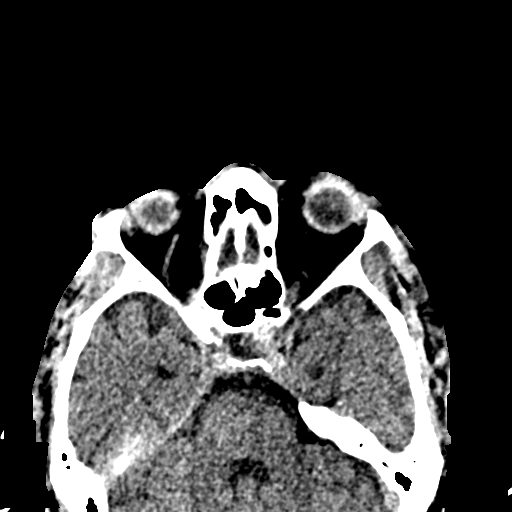
[im 41/47  brain]
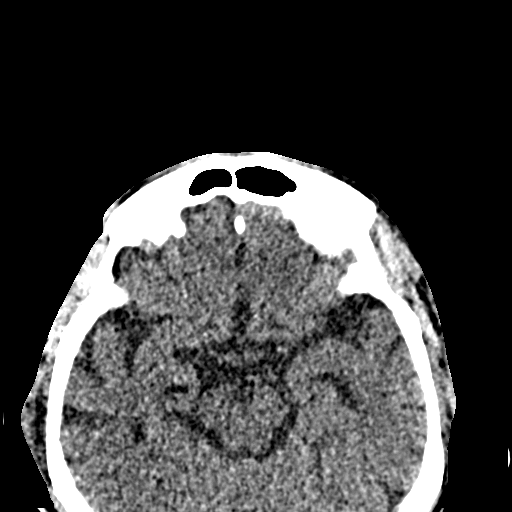

[Series 9: sagittal soft · sagittal · 0.22mm/px · 2 of 83 slices shown]
[im 28/83  brain]
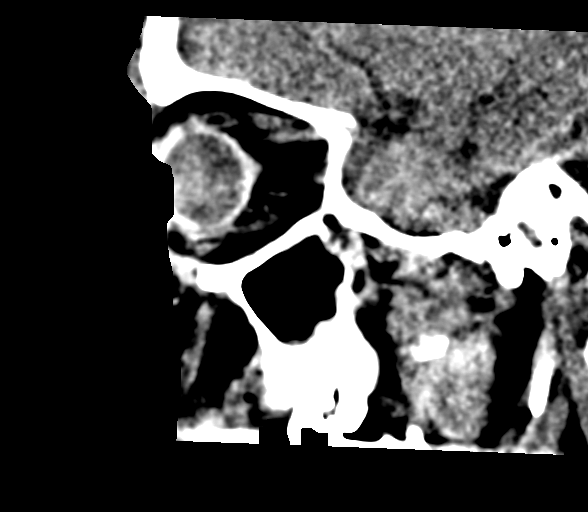
[im 55/83  brain]
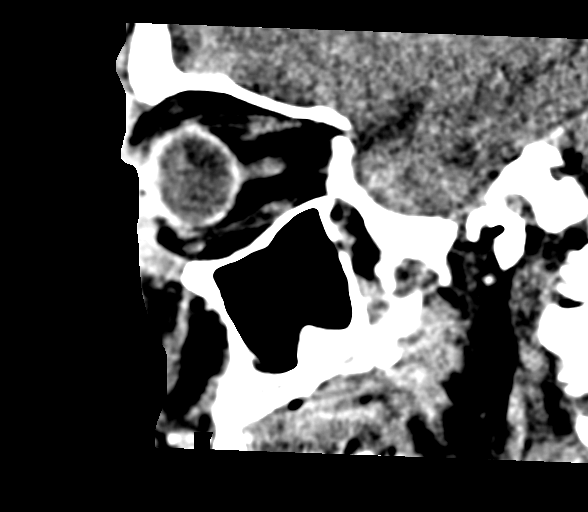

[16 of 47 positions shown; findings below may reference images not displayed]

FINDINGS: CT HEAD FINDINGS

Brain: There is mild age-related atrophy and chronic microvascular
ischemic changes. There is no acute intracranial hemorrhage. No mass
effect or midline shift. No extra-axial fluid collection.

Vascular: No hyperdense vessel or unexpected calcification.

Skull: Normal. Negative for fracture or focal lesion.

Other: None

CT ORBITS FINDINGS

Evaluation of this exam is limited in the absence of intravenous
contrast.

Orbits: The globes and retro-orbital fat are preserved. No acute or
traumatic injury.

Visualized sinuses: There is mild diffuse mucoperiosteal thickening
of paranasal sinuses. No air-fluid level. Mild bilateral mastoid
effusions.

Soft tissues: Left facial and periorbital soft tissue contusion. No
large hematoma.
IMPRESSION: 1. No acute intracranial pathology.
2. No acute/traumatic orbital pathology.
3. Left facial and periorbital soft tissue contusion.

## 2021-03-15 MED ORDER — RENA-VITE PO TABS
1.0000 | ORAL_TABLET | Freq: Every day | ORAL | Status: DC
  Administered 2021-03-15 – 2021-04-07 (×24): 1 via ORAL
  Filled 2021-03-15 (×25): qty 1

## 2021-03-15 MED ORDER — SODIUM CHLORIDE 0.9 % IV SOLN
1.0000 g | INTRAVENOUS | Status: DC
  Filled 2021-03-15: qty 10

## 2021-03-15 MED ORDER — KETOROLAC TROMETHAMINE 30 MG/ML IJ SOLN
15.0000 mg | Freq: Once | INTRAMUSCULAR | Status: AC
  Administered 2021-03-15: 15 mg via INTRAVENOUS
  Filled 2021-03-15: qty 1

## 2021-03-15 MED ORDER — ASPIRIN 81 MG PO CHEW
324.0000 mg | CHEWABLE_TABLET | Freq: Once | ORAL | Status: AC
  Administered 2021-03-15: 324 mg via ORAL
  Filled 2021-03-15: qty 4

## 2021-03-15 MED ORDER — DARBEPOETIN ALFA 100 MCG/0.5ML IJ SOSY
100.0000 ug | PREFILLED_SYRINGE | INTRAMUSCULAR | Status: DC
  Filled 2021-03-15: qty 0.5

## 2021-03-15 MED ORDER — NEPRO/CARBSTEADY PO LIQD
237.0000 mL | Freq: Two times a day (BID) | ORAL | Status: DC
  Administered 2021-03-15 – 2021-04-07 (×37): 237 mL via ORAL

## 2021-03-15 MED ORDER — CIPROFLOXACIN HCL 0.3 % OP SOLN
2.0000 [drp] | Freq: Four times a day (QID) | OPHTHALMIC | Status: DC
  Administered 2021-03-15: 2 [drp] via OPHTHALMIC
  Filled 2021-03-15: qty 2.5

## 2021-03-15 MED ORDER — SODIUM CHLORIDE 0.9 % IV SOLN
1.0000 g | INTRAVENOUS | Status: DC
  Administered 2021-03-16 – 2021-03-17 (×2): 1 g via INTRAVENOUS
  Filled 2021-03-15 (×2): qty 10

## 2021-03-15 MED ORDER — GABAPENTIN 100 MG PO CAPS
100.0000 mg | ORAL_CAPSULE | Freq: Every day | ORAL | Status: DC
  Administered 2021-03-15 – 2021-03-26 (×12): 100 mg via ORAL
  Filled 2021-03-15 (×12): qty 1

## 2021-03-15 MED ORDER — SODIUM CHLORIDE 0.9% FLUSH
3.0000 mL | Freq: Two times a day (BID) | INTRAVENOUS | Status: DC
  Administered 2021-03-15 – 2021-04-07 (×46): 3 mL via INTRAVENOUS

## 2021-03-15 MED ORDER — MIDODRINE HCL 5 MG PO TABS
10.0000 mg | ORAL_TABLET | Freq: Three times a day (TID) | ORAL | Status: DC
  Administered 2021-03-15 – 2021-03-21 (×9): 10 mg via ORAL
  Filled 2021-03-15 (×9): qty 2

## 2021-03-15 MED ORDER — SODIUM CHLORIDE 0.9 % IV SOLN
100.0000 mg | INTRAVENOUS | Status: DC
  Administered 2021-03-16: 100 mg via INTRAVENOUS
  Filled 2021-03-15 (×3): qty 5

## 2021-03-15 MED ORDER — CIPROFLOXACIN HCL 0.3 % OP SOLN
2.0000 [drp] | Freq: Once | OPHTHALMIC | Status: AC
  Administered 2021-03-15: 2 [drp] via OPHTHALMIC
  Filled 2021-03-15: qty 2.5

## 2021-03-15 MED ORDER — ACETAMINOPHEN 650 MG RE SUPP: 650.0000 mg | Freq: Four times a day (QID) | RECTAL | Status: DC | PRN

## 2021-03-15 MED ORDER — SODIUM CHLORIDE 0.9 % IV SOLN
1.0000 g | Freq: Once | INTRAVENOUS | Status: AC
  Administered 2021-03-15: 1 g via INTRAVENOUS
  Filled 2021-03-15: qty 10

## 2021-03-15 MED ORDER — DOXERCALCIFEROL 4 MCG/2ML IV SOLN
3.0000 ug | INTRAVENOUS | Status: DC
  Administered 2021-03-19: 3 ug via INTRAVENOUS
  Filled 2021-03-15 (×3): qty 2

## 2021-03-15 MED ORDER — OXYCODONE-ACETAMINOPHEN 5-325 MG PO TABS
1.0000 | ORAL_TABLET | Freq: Four times a day (QID) | ORAL | Status: DC | PRN
Start: 2021-03-15 — End: 2021-03-20
  Administered 2021-03-15 – 2021-03-19 (×9): 1 via ORAL
  Filled 2021-03-15 (×9): qty 1

## 2021-03-15 MED ORDER — ACETAMINOPHEN 325 MG PO TABS
650.0000 mg | ORAL_TABLET | Freq: Four times a day (QID) | ORAL | Status: DC | PRN
  Administered 2021-03-15 – 2021-04-03 (×2): 650 mg via ORAL
  Filled 2021-03-15 (×2): qty 2

## 2021-03-15 MED ORDER — ALBUTEROL SULFATE (2.5 MG/3ML) 0.083% IN NEBU: 2.5000 mg | INHALATION_SOLUTION | Freq: Four times a day (QID) | RESPIRATORY_TRACT | Status: DC | PRN

## 2021-03-15 MED ORDER — NITROGLYCERIN 2 % TD OINT
0.5000 [in_us] | TOPICAL_OINTMENT | Freq: Once | TRANSDERMAL | Status: AC
  Administered 2021-03-15: 0.5 [in_us] via TOPICAL
  Filled 2021-03-15: qty 1

## 2021-03-15 MED ORDER — CHLORHEXIDINE GLUCONATE CLOTH 2 % EX PADS
6.0000 | MEDICATED_PAD | Freq: Every day | CUTANEOUS | Status: DC
  Administered 2021-03-16 – 2021-04-07 (×21): 6 via TOPICAL

## 2021-03-15 MED ORDER — HEPARIN SODIUM (PORCINE) 5000 UNIT/ML IJ SOLN
5000.0000 [IU] | Freq: Three times a day (TID) | INTRAMUSCULAR | Status: DC
  Administered 2021-03-15 – 2021-04-07 (×61): 5000 [IU] via SUBCUTANEOUS
  Filled 2021-03-15 (×61): qty 1

## 2021-03-15 MED ORDER — SUCROFERRIC OXYHYDROXIDE 500 MG PO CHEW
1000.0000 mg | CHEWABLE_TABLET | Freq: Three times a day (TID) | ORAL | Status: DC
  Administered 2021-03-15 – 2021-04-07 (×59): 1000 mg via ORAL
  Filled 2021-03-15 (×72): qty 2

## 2021-03-15 MED ORDER — LORATADINE 10 MG PO TABS
10.0000 mg | ORAL_TABLET | Freq: Once | ORAL | Status: AC
  Administered 2021-03-15: 10 mg via ORAL
  Filled 2021-03-15: qty 1

## 2021-03-15 NOTE — ED Provider Notes (Signed)
[facility] DE[facility] Provider Note   CSN: [id number] Arrival date & time: 03/14/21  2248     History No chief complaint on file.   Ryan Wells is a 65 y.o. male.  The history is provided by the patient and a relative.  Illness Location:  Back and BLE pain Quality:  Pain Severity:  Moderate Onset quality:  Gradual Duration: weeks, was supposed to go to PT but did not go due to pain.  Moreover he did not go to dialysis today or Monday secondary to same. Timing:  Constant Progression:  Worsening Chronicity:  Chronic Context:  ESRD MWF, with diabetes Relieved by:  Nothing Worsened by:  Nothing Ineffective treatments:  None Associated symptoms: no cough, no fever, no loss of consciousness, no rash and no vomiting   Risk factors:  ESRD and DM Patient with DM, HTN, and ESRD MWF dialysis. Has known chronic back and leg pain.  Was supposed to be in PT but did not go to PT (started 3 weeks ago) or dialysis secondary to pain.  Has some SOB.  Also of note his left eyelid swelled in the past day and he has some discharge from the corner of the left eye.  He makes some urine but none today.      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  Did not get outpatient stress testing following hospitalization.  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   Long term use of drug 07/29/2016   Joint pain 07/12/2016   Hypothyroidism 07/12/2014   Diabetes type 2, uncontrolled ([place]) 11/25/2011    Past Surgical History:  Procedure Laterality Date   A/V FISTULAGRAM Left 06/04/2019   Procedure: A/V FISTULAGRAM;  Surgeon: Angelia Mould, MD;  Location: [place] CV LAB;  Service: Cardiovascular;  Laterality: Left;   A/V FISTULAGRAM Left 10/15/2019   Procedure: A/V FISTULAGRAM;  Surgeon: Angelia Mould, MD;  Location: [place] CV LAB;  Service: Cardiovascular;  Laterality: Left;   A/V FISTULAGRAM N/A 01/24/2021   Procedure: A/V FISTULAGRAM - Left Upper;  Surgeon: Cherre Robins, MD;  Location: [place] [facility];  Service: Cardiovascular;  Laterality: N/A;   AV FISTULA PLACEMENT Left 07/02/2018   Procedure: BRACHIOCEPHALIC ARTERIOVENOUS (AV) FISTULA CREATION LEFT ARM;  Surgeon: Serafina Mitchell, MD;  Location: [facility]  OR;  Service: Vascular;  Laterality: Left;   BASCILIC VEIN TRANSPOSITION Left 07/26/2019   Procedure: Bascilic Vein Transposition;  Surgeon: Angelia Mould, MD;  Location: [facility];  Service: Vascular;  Laterality: Left;   COLONOSCOPY     EMBOLIZATION Left 06/04/2019   Procedure: EMBOLIZATION;  Surgeon: Angelia Mould, MD;  Location: [place] CV LAB;  Service: Cardiovascular;  Laterality: Left;  LT ARM FISTULA/COMPETING BRANCH   EYE SURGERY Bilateral    cataracts x2   EYE SURGERY     INSERTION OF DIALYSIS CATHETER Right 06/08/2019   Procedure: INSERTION OF  PALINDROME DIALYSIS CATHETER IN RIGHT INTERNAL JUGULAR;  Surgeon: Angelia Mould, MD;  Location: [place];  Service: Vascular;  Laterality: Right;   LIGATION OF ARTERIOVENOUS  FISTULA Left 07/26/2019   Procedure: Ligation Of BrachioCephalic  Fistula;  Surgeon: Angelia Mould, MD;  Location: [place];  Service: Vascular;  Laterality: Left;   NECK SURGERY     PERIPHERAL VASCULAR BALLOON ANGIOPLASTY Left 06/04/2019   Procedure: PERIPHERAL VASCULAR BALLOON ANGIOPLASTY;  Surgeon: Angelia Mould, MD;  Location: [place] [facility] LAB;  Service: Cardiovascular;  Laterality: Left;  ARM FISTULA   PERIPHERAL VASCULAR BALLOON ANGIOPLASTY Left 10/15/2019   Procedure: PERIPHERAL VASCULAR BALLOON ANGIOPLASTY;  Surgeon: Angelia Mould, MD;  Location: [place] CV LAB;  Service: Cardiovascular;  Laterality: Left;  arm fistula   TEE WITHOUT CARDIOVERSION N/A 01/19/2021   Procedure: TRANSESOPHAGEAL ECHOCARDIOGRAM (TEE);  Surgeon: Jerline Pain, MD;  Location: [facility] ENDOSCOPY;  Service: Cardiovascular;  Laterality: N/A;       Family History  Problem Relation Age of Onset   Heart disease Mother        cause of death--CHF?   Diabetes Father    Kidney disease Father        Dialysis for kidney failure   Diabetes Sister    Diabetes Brother    Colon cancer Neg Hx    Stomach cancer Neg Hx    Esophageal cancer Neg Hx     Social History   Tobacco Use   Smoking status: Former   Smokeless tobacco: Never  Scientific laboratory technician Use: Never used  Substance Use Topics   Alcohol use: Not Currently   Drug use: Never    Home Medications Prior to Admission medications   Medication Sig Start Date End Date Taking? Authorizing Provider  ceFAZolin (ANCEF) 2-4 GM/100ML-% IVPB Inject 100 mLs (2 g total) into the vein every Monday, Wednesday, and Friday at 6 PM. 01/30/21   Riesa Pope, MD  Darbepoetin Alfa (ARANESP) 60 MCG/0.3ML SOSY injection Inject 0.3 mLs (60 mcg total) into the vein every  Friday with hemodialysis. 02/02/21   Riesa Pope, MD  doxercalciferol (HECTOROL) 4 MCG/2ML injection Inject 2 mLs (4 mcg total) into the vein every Monday, Wednesday, and Friday with hemodialysis. 01/30/21   Katsadouros, Vasilios, MD  midodrine (PROAMATINE) 10 MG tablet Take 1 tablet (10 mg total) by mouth 3 (three) times daily with meals. 01/30/21   Riesa Pope, MD  oxyCODONE-acetaminophen (PERCOCET/ROXICET) 5-325 MG tablet Take 1 tablet by mouth every 6 (six) hours as needed for severe pain. 03/06/21   Lajean Saver, MD  pentafluoroprop-tetrafluoroeth [place]ke) AERO Apply 1 application topically as needed (topical anesthesia for hemodialysis). 01/30/21   Katsadouros, Vasilios, MD  predniSONE (DELTASONE) 20 MG tablet 3 po once a day for 3 days, then 2 po once a day for 3 days, then 1 po once a day for 3 days Patient  not taking: Reported on 03/13/2021 03/07/21   Lajean Saver, MD  VELPHORO 500 MG chewable tablet Chew 1,000 mg by mouth 3 (three) times daily with meals. 08/18/19   [provider]    Allergies    Patient has no known allergies.  Review of Systems   Review of Systems  Constitutional:  Negative for fever.  HENT:  Negative for drooling.   Eyes:  Positive for discharge. Negative for visual disturbance.       Eyelid swelling L  Respiratory:  Negative for cough.   Cardiovascular:  Negative for leg swelling.  Gastrointestinal:  Negative for vomiting.  Genitourinary:  Negative for difficulty urinating.  Musculoskeletal:  Positive for arthralgias and back pain. Negative for neck stiffness.  Skin:  Negative for rash.  Neurological:  Negative for loss of consciousness and facial asymmetry.  Psychiatric/Behavioral:  Negative for agitation.   All other systems reviewed and are negative.  Physical Exam Updated Vital Signs BP (!) 149/69   Pulse 76   Temp 98.3 F (36.8 C) (Oral)   Resp 20   Ht '5\' 2"'$  (1.575 m)   Wt 69 kg   SpO2 98%   BMI 27.82 kg/m    Physical Exam Vitals and nursing note reviewed.  Constitutional:      Appearance: Normal appearance. He is not diaphoretic.  HENT:     Head: Normocephalic and atraumatic.     Nose: Nose normal.  Eyes:     General: No scleral icterus.       Left eye: Discharge present.    Extraocular Movements:     Right eye: No nystagmus.     Left eye: No nystagmus.     Conjunctiva/sclera:     Left eye: No chemosis.  Cardiovascular:     Rate and Rhythm: Normal rate and regular rhythm.     Pulses: Normal pulses.     Heart sounds: Normal heart sounds.  Pulmonary:     Effort: Pulmonary effort is normal.     Breath sounds: No stridor. Rales present.  Abdominal:     General: Abdomen is flat. Bowel sounds are normal.     Palpations: Abdomen is soft.     Tenderness: There is no abdominal tenderness. There is no guarding or rebound.  Musculoskeletal:        General: Normal range of motion.     Cervical back: Normal range of motion and neck supple. No rigidity.     Right lower leg: No edema.     Left lower leg: No edema.  Skin:    General: Skin is warm and dry.     Capillary Refill: Capillary refill takes less than 2 seconds.     Findings: No lesion or rash.  Neurological:     General: No focal deficit present.     Mental Status: He is alert.     Deep Tendon Reflexes: Reflexes normal.  Psychiatric:        Mood and Affect: Mood normal.    ED Results / Procedures / Treatments   Labs (all labs ordered are listed, but only abnormal results are displayed) Results for orders placed or performed during the hospital encounter of 03/14/21  Comprehensive metabolic panel  Result Value Ref Range   Sodium 139 135 - 145 mmol/L   Potassium 4.4 3.5 - 5.1 mmol/L   Chloride 94 (L) 98 - 111 mmol/L   CO2 25 22 - 32 mmol/L   Glucose, Bld 173 (H) 70 - 99 mg/dL  BUN 63 (H) 8 - 23 mg/dL   Creatinine, Ser 8.50 (H) 0.61 - 1.24 mg/dL   Calcium 7.9 (L) 8.9 - 10.3 mg/dL   Total Protein 7.1 6.5 - 8.1 g/dL    Albumin 3.4 (L) 3.5 - 5.0 g/dL   AST 21 15 - 41 U/L   ALT 9 0 - 44 U/L   Alkaline Phosphatase 107 38 - 126 U/L   Total Bilirubin 1.2 0.3 - 1.2 mg/dL   GFR, Estimated 6 (L) >60 mL/min   Anion gap 20 (H) 5 - 15  CBC with Differential  Result Value Ref Range   WBC 15.1 (H) 4.0 - 10.5 K/uL   RBC 3.34 (L) 4.22 - 5.81 MIL/uL   Hemoglobin 10.0 (L) 13.0 - 17.0 g/dL   HCT 32.0 (L) 39.0 - 52.0 %   MCV 95.8 80.0 - 100.0 fL   MCH 29.9 26.0 - 34.0 pg   MCHC 31.3 30.0 - 36.0 g/dL   RDW 18.5 (H) 11.5 - 15.5 %   Platelets 212 150 - 400 K/uL   nRBC 0.1 0.0 - 0.2 %   Neutrophils Relative % 85 %   Neutro Abs 12.8 (H) 1.7 - 7.7 K/uL   Lymphocytes Relative 8 %   Lymphs Abs 1.3 0.7 - 4.0 K/uL   Monocytes Relative 6 %   Monocytes Absolute 0.9 0.1 - 1.0 K/uL   Eosinophils Relative 0 %   Eosinophils Absolute 0.0 0.0 - 0.5 K/uL   Basophils Relative 0 %   Basophils Absolute 0.0 0.0 - 0.1 K/uL   Immature Granulocytes 1 %   Abs Immature Granulocytes 0.12 (H) 0.00 - 0.07 K/uL  Troponin I (High Sensitivity)  Result Value Ref Range   Troponin I (High Sensitivity) 84 (H) <18 ng/L   DG Chest 1 View  Result Date: 03/15/2021 CLINICAL DATA:  65 year old male with generalized pain. EXAM: CHEST  1 VIEW COMPARISON:  Chest radiograph dated 01/28/2021. FINDINGS: Cardiomegaly with vascular congestion. Focal area of subpleural nodularity along the lateral right mid lung field likely corresponds to the previously seen cavitary lesion. Overall the previously seen nodules are less conspicuous on today's study. There is a small right pleural effusion or pleural thickening. No pneumothorax. No acute osseous pathology. Degenerative changes of the spine. Partially visualized cervical ACDF and left axillary vascular stent. No acute osseous pathology. IMPRESSION: 1. Cardiomegaly with vascular congestion. 2. Focal area of subpleural nodularity along the right mid lung field likely corresponds to the previously seen cavitary lesion.  3. Small right pleural effusion. Electronically Signed   By: Anner Crete M.D.   On: 03/15/2021 00:05   DG Lumbar Spine Complete  Result Date: 03/05/2021 CLINICAL DATA:  Fall, back pain for 8 months EXAM: LUMBAR SPINE - COMPLETE 4+ VIEW COMPARISON:  MR lumbar spine, 01/17/2021 FINDINGS: There is no evidence of lumbar spine fracture. Alignment is normal. Mild disc space height loss and osteophytosis at L1-L2 and L5-S1 with otherwise intact disc spaces and minimal endplate osteophytosis. Stool and stool balls throughout the colon and rectum. IMPRESSION: 1.  No fracture or dislocation of the lumbar spine. 2. Mild disc space height loss and osteophytosis at L1-L2 and L5-S1, similar to prior MR. Electronically Signed   By: Eddie Candle M.D.   On: 03/05/2021 21:34   CT HEAD WO CONTRAST (5MM)  Result Date: 03/15/2021 CLINICAL DATA:  65 year old male with ocular pain and swelling over the left orbit. Facial trauma. EXAM: CT HEAD AND ORBITS WITHOUT CONTRAST TECHNIQUE: Contiguous axial  images were obtained from the base of the skull through the vertex without contrast. Multidetector CT imaging of the orbits was performed using the standard protocol without intravenous contrast. COMPARISON:  None FINDINGS: CT HEAD FINDINGS Brain: There is mild age-related atrophy and chronic microvascular ischemic changes. There is no acute intracranial hemorrhage. No mass effect or midline shift. No extra-axial fluid collection. Vascular: No hyperdense vessel or unexpected calcification. Skull: Normal. Negative for fracture or focal lesion. Other: None CT ORBITS FINDINGS Evaluation of this exam is limited in the absence of intravenous contrast. Orbits: The globes and retro-orbital fat are preserved. No acute or traumatic injury. Visualized sinuses: There is mild diffuse mucoperiosteal thickening of paranasal sinuses. No air-fluid level. Mild bilateral mastoid effusions. Soft tissues: Left facial and periorbital soft tissue  contusion. No large hematoma. IMPRESSION: 1. No acute intracranial pathology. 2. No acute/traumatic orbital pathology. 3. Left facial and periorbital soft tissue contusion. Electronically Signed   By: Anner Crete M.D.   On: 03/15/2021 00:34   DG Hip Unilat With Pelvis 2-3 Views Left  Result Date: 03/05/2021 CLINICAL DATA:  65 year old male with fall and left hip pain EXAM: DG HIP (WITH OR WITHOUT PELVIS) 2-3V LEFT COMPARISON:  None. FINDINGS: There is no acute fracture or dislocation. Mild osteopenia. There is mild arthritic changes of the hips and degenerative changes of the lower lumbar spine. The soft tissues are unremarkable. IMPRESSION: No acute fracture or dislocation. Electronically Signed   By: Anner Crete M.D.   On: 03/05/2021 21:29   CT Orbits Wo Contrast  Result Date: 03/15/2021 CLINICAL DATA:  65 year old male with ocular pain and swelling over the left orbit. Facial trauma. EXAM: CT HEAD AND ORBITS WITHOUT CONTRAST TECHNIQUE: Contiguous axial images were obtained from the base of the skull through the vertex without contrast. Multidetector CT imaging of the orbits was performed using the standard protocol without intravenous contrast. COMPARISON:  None FINDINGS: CT HEAD FINDINGS Brain: There is mild age-related atrophy and chronic microvascular ischemic changes. There is no acute intracranial hemorrhage. No mass effect or midline shift. No extra-axial fluid collection. Vascular: No hyperdense vessel or unexpected calcification. Skull: Normal. Negative for fracture or focal lesion. Other: None CT ORBITS FINDINGS Evaluation of this exam is limited in the absence of intravenous contrast. Orbits: The globes and retro-orbital fat are preserved. No acute or traumatic injury. Visualized sinuses: There is mild diffuse mucoperiosteal thickening of paranasal sinuses. No air-fluid level. Mild bilateral mastoid effusions. Soft tissues: Left facial and periorbital soft tissue contusion. No large  hematoma. IMPRESSION: 1. No acute intracranial pathology. 2. No acute/traumatic orbital pathology. 3. Left facial and periorbital soft tissue contusion. Electronically Signed   By: Anner Crete M.D.   On: 03/15/2021 00:34    EKG Muse down HR 72 ST T changes 1/AVL v4-v6 concerning for ischemia.    Radiology DG Chest 1 View  Result Date: 03/15/2021 CLINICAL DATA:  65 year old male with generalized pain. EXAM: CHEST  1 VIEW COMPARISON:  Chest radiograph dated 01/28/2021. FINDINGS: Cardiomegaly with vascular congestion. Focal area of subpleural nodularity along the lateral right mid lung field likely corresponds to the previously seen cavitary lesion. Overall the previously seen nodules are less conspicuous on today's study. There is a small right pleural effusion or pleural thickening. No pneumothorax. No acute osseous pathology. Degenerative changes of the spine. Partially visualized cervical ACDF and left axillary vascular stent. No acute osseous pathology. IMPRESSION: 1. Cardiomegaly with vascular congestion. 2. Focal area of subpleural nodularity along the right  mid lung field likely corresponds to the previously seen cavitary lesion. 3. Small right pleural effusion. Electronically Signed   By: Anner Crete M.D.   On: 03/15/2021 00:05   CT HEAD WO CONTRAST (5MM)  Result Date: 03/15/2021 CLINICAL DATA:  65 year old male with ocular pain and swelling over the left orbit. Facial trauma. EXAM: CT HEAD AND ORBITS WITHOUT CONTRAST TECHNIQUE: Contiguous axial images were obtained from the base of the skull through the vertex without contrast. Multidetector CT imaging of the orbits was performed using the standard protocol without intravenous contrast. COMPARISON:  None FINDINGS: CT HEAD FINDINGS Brain: There is mild age-related atrophy and chronic microvascular ischemic changes. There is no acute intracranial hemorrhage. No mass effect or midline shift. No extra-axial fluid collection. Vascular: No  hyperdense vessel or unexpected calcification. Skull: Normal. Negative for fracture or focal lesion. Other: None CT ORBITS FINDINGS Evaluation of this exam is limited in the absence of intravenous contrast. Orbits: The globes and retro-orbital fat are preserved. No acute or traumatic injury. Visualized sinuses: There is mild diffuse mucoperiosteal thickening of paranasal sinuses. No air-fluid level. Mild bilateral mastoid effusions. Soft tissues: Left facial and periorbital soft tissue contusion. No large hematoma. IMPRESSION: 1. No acute intracranial pathology. 2. No acute/traumatic orbital pathology. 3. Left facial and periorbital soft tissue contusion. Electronically Signed   By: Anner Crete M.D.   On: 03/15/2021 00:34   CT Orbits Wo Contrast  Result Date: 03/15/2021 CLINICAL DATA:  65 year old male with ocular pain and swelling over the left orbit. Facial trauma. EXAM: CT HEAD AND ORBITS WITHOUT CONTRAST TECHNIQUE: Contiguous axial images were obtained from the base of the skull through the vertex without contrast. Multidetector CT imaging of the orbits was performed using the standard protocol without intravenous contrast. COMPARISON:  None FINDINGS: CT HEAD FINDINGS Brain: There is mild age-related atrophy and chronic microvascular ischemic changes. There is no acute intracranial hemorrhage. No mass effect or midline shift. No extra-axial fluid collection. Vascular: No hyperdense vessel or unexpected calcification. Skull: Normal. Negative for fracture or focal lesion. Other: None CT ORBITS FINDINGS Evaluation of this exam is limited in the absence of intravenous contrast. Orbits: The globes and retro-orbital fat are preserved. No acute or traumatic injury. Visualized sinuses: There is mild diffuse mucoperiosteal thickening of paranasal sinuses. No air-fluid level. Mild bilateral mastoid effusions. Soft tissues: Left facial and periorbital soft tissue contusion. No large hematoma. IMPRESSION: 1. No  acute intracranial pathology. 2. No acute/traumatic orbital pathology. 3. Left facial and periorbital soft tissue contusion. Electronically Signed   By: Anner Crete M.D.   On: 03/15/2021 00:34    Procedures Procedures   Medications Ordered in ED Medications  cefTRIAXone (ROCEPHIN) 1 g in sodium chloride 0.9 % 100 mL IVPB (1 g Intravenous New Bag/Given 03/15/21 0214)  ketorolac (TORADOL) 30 MG/ML injection 15 mg (15 mg Intravenous Given 03/15/21 0122)  loratadine (CLARITIN) tablet 10 mg (10 mg Oral Given 03/15/21 0211)  ciprofloxacin (CILOXAN) 0.3 % ophthalmic solution 2 drop (2 drops Left Eye Given 03/15/21 0211)    ED Course  I have reviewed the triage vital signs and the nursing notes.  Pertinent labs & imaging results that were available during my care of the patient were reviewed by me and considered in my medical decision making (see chart for details).   KHYON SEWER was evaluated in Emergency Department on 03/15/2021 for the symptoms described in the history of present illness. He was evaluated in the context of the [place] COVID-19  pandemic, which necessitated consideration that the patient might be at risk for infection with the SARS-CoV-2 virus that causes COVID-19. Institutional protocols and algorithms that pertain to the evaluation of patients at risk for COVID-19 are in a state of rapid change based on information released by regulatory bodies including the CDC and federal and state organizations. These policies and algorithms were followed during the patient's care in the ED.  Final Clinical Impression(s) / ED Diagnoses Final diagnoses:  Pain  Acute pulmonary edema (HCC)  Elevated troponin  Acute bacterial conjunctivitis of left eye  Preseptal cellulitis of left eye   Admit to medicine   Per Dr. Carolin Sicks, call am team for dialysis  Rx / DC Orders ED Discharge Orders     None        Requan Hardge, MD 03/15/21 0236

## 2021-03-15 NOTE — H&P (Signed)
History and Physical    Ryan Wells:[id number] DOB: 1956-01-16 DOA: 03/14/2021  Referring MD/NP/PA: Carlynn Purl, MD PCP: Pcp, No  Patient coming from: Transfer from [place]  Chief Complaint: Back and leg pain  I have personally briefly reviewed patient's old medical records in [place]   HPI: Ryan Wells is a 65 y.o. male with medical history significant of ESRD on HD, hypertension, diabetes mellitus type 2, anemia of chronic disease, and MSSA bacteremia presented with complaints of back and leg pain.  History is obtained with use of Spanish interpreter services.  Symptoms have been acutely worse at least for the last 2 weeks.  He complains of having severe 10/10 sharp pains in his lower back that radiate down either leg.  He complains of his legs feeling cold and being unable to stand or move around due to symptoms.  Denies any recent trauma or injury to onset symptoms.  Due to the symptoms he had missed hemodialysis yesterday, and last dialyzed on 8/1.  The other day he reported having uncontrollable sweating which was unusual for him. 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  It appears during this hospitalization he had MRI imaging which noted bilateral L5-S1 facet osteoarthritis left greater than right with synovial cysts and periarticular soft tissue edema on the left which superimposed septic arthritis could not be excluded and anterior displacement of the cord with distortion at T6-T7.  He had completed 6 weeks of IV antibiotics with dialysis per ID notes from 8/2.  Patient was last in the emergency department on 7/25 complaining of radicular low back pain.  At that time neurosurgery had been consulted and recommended a short course of steroids and to follow-up in the office.  Patient does not appear to have done this.  He denies  having shortness of breath, fever, chest pain, nausea, vomiting, or recent trauma.  It appears he was referred for physical therapy, but had not been able to do it due to pain.   ED Course: Upon admission into the emergency department patient was seen to be afebrile with stable vital signs.  CT imaging of the head and orbits negative for any acute intracranial pathology, but noted left facial and periorbital soft tissue contusion.  Labs from 8/3 significant for WBC 15.1, hemoglobin 10, potassium 4.4, BUN 63, creatinine 8.5, anion gap 20, and high-sensitivity troponin 84->101.  Chest x-ray significant for cardiomegaly with vascular congestion, focal area with pulmonary nodularity of the right mid lung correlates with cavitating lesion and a small right-sided pleural effusion.  Influenza and COVID-19 screening both were negative.  He had been given full dose aspirin, nitroglycerin, ketorolac, Rocephin IV, and ciprofloxacin ophthalmic drops for concern for preseptal cellulitis and acute bacterial conjunctivitis.  Review of Systems  Constitutional:  Negative for fever.  HENT:  Negative for congestion and nosebleeds.   Eyes:  Negative for photophobia and pain.  Respiratory:  Negative for cough and shortness of breath.   Cardiovascular:  Negative for chest pain and leg swelling.  Gastrointestinal:  Negative for abdominal pain, nausea and vomiting.  Genitourinary:  Negative for dysuria.  Musculoskeletal:  Positive for back pain. Negative for falls.  Skin:  Negative for rash.  Neurological:  Positive for sensory change and weakness. Negative for focal weakness.  Psychiatric/Behavioral:  Negative for substance abuse.    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  Did not get outpatient stress testing following hospitalization.  No  chest pain since   Chronic kidney disease    on dialysis Tues, Thurs and Sat   Diabetes mellitus without complication ([place])    Diabetes type 2, uncontrolled ([place]) 11/25/2011   no meds, diet controlled per patient   Diabetic gastroparesis ([place]) 01/22/2017   Hypertension    no meds   Microalbuminuria 01/19/2017   Myocardial infarction [facility]) 2015    Past Surgical History:  Procedure Laterality Date   A/V FISTULAGRAM Left 06/04/2019   Procedure: A/V FISTULAGRAM;  Surgeon: Angelia Mould, MD;  Location: [place] [facility];  Service: Cardiovascular;  Laterality: Left;   A/V FISTULAGRAM Left 10/15/2019   Procedure: A/V FISTULAGRAM;  Surgeon: Angelia Mould, MD;  Location: [place] [facility];  Service: Cardiovascular;  Laterality: Left;   A/V FISTULAGRAM N/A 01/24/2021   Procedure: A/V FISTULAGRAM - Left Upper;  Surgeon: Cherre Robins, MD;  Location: [place] [facility];  Service: Cardiovascular;  Laterality: N/A;   AV FISTULA PLACEMENT Left 07/02/2018   Procedure: BRACHIOCEPHALIC ARTERIOVENOUS (AV) FISTULA CREATION LEFT ARM;  Surgeon: Serafina Mitchell, MD;  Location: [place];  Service: Vascular;  Laterality: Left;   [place] Left 07/26/2019   Procedure: Bascilic Vein Transposition;  Surgeon: Angelia Mould, MD;  Location: [place];  Service: Vascular;  Laterality: Left;   COLONOSCOPY     EMBOLIZATION Left 06/04/2019   Procedure: EMBOLIZATION;  Surgeon: Angelia Mould, MD;  Location: [place] [facility];  Service: Cardiovascular;  Laterality: Left;  LT ARM FISTULA/COMPETING BRANCH   EYE SURGERY Bilateral    cataracts x2   EYE SURGERY     INSERTION OF DIALYSIS CATHETER Right 06/08/2019   Procedure: INSERTION OF PALINDROME DIALYSIS CATHETER IN RIGHT INTERNAL JUGULAR;  Surgeon: Angelia Mould, MD;  Location: [place];  Service: Vascular;  Laterality: Right;   LIGATION OF ARTERIOVENOUS  FISTULA Left 07/26/2019   Procedure: Ligation Of  BrachioCephalic  Fistula;  Surgeon: Angelia Mould, MD;  Location: [place];  Service: Vascular;  Laterality: Left;   NECK SURGERY     PERIPHERAL VASCULAR BALLOON ANGIOPLASTY Left 06/04/2019   Procedure: PERIPHERAL VASCULAR BALLOON ANGIOPLASTY;  Surgeon: Angelia Mould, MD;  Location: [place] [facility];  Service: Cardiovascular;  Laterality: Left;  ARM FISTULA   PERIPHERAL VASCULAR BALLOON ANGIOPLASTY Left 10/15/2019   Procedure: PERIPHERAL VASCULAR BALLOON ANGIOPLASTY;  Surgeon: Angelia Mould, MD;  Location: [place] [facility];  Service: Cardiovascular;  Laterality: Left;  arm fistula   TEE WITHOUT CARDIOVERSION N/A 01/19/2021   Procedure: TRANSESOPHAGEAL ECHOCARDIOGRAM (TEE);  Surgeon: Jerline Pain, MD;  Location[facility] ENDOSCOPY;  Service: Cardiovascular;  Laterality: N/A;     reports that he has quit smoking. He has never used smokeless tobacco. He reports previous alcohol use. He reports that he does not use drugs.  No Known Allergies  Family History  Problem Relation Age of Onset   Heart disease Mother        cause of death--CHF?   Diabetes Father    Kidney disease Father        Dialysis for kidney failure   Diabetes Sister    Diabetes Brother    Colon cancer Neg Hx    Stomach cancer Neg Hx  Esophageal cancer Neg Hx     Prior to Admission medications   Medication Sig Start Date End Date Taking? Authorizing Provider  oxyCODONE-acetaminophen (PERCOCET/ROXICET) 5-325 MG tablet Take 1 tablet by mouth every 6 (six) hours as needed for severe pain. 03/06/21  Yes Lajean Saver, MD  ceFAZolin (ANCEF) 2-4 GM/100ML-% IVPB Inject 100 mLs (2 g total) into the vein every Monday, Wednesday, and Friday at 6 PM. 01/30/21   Riesa Pope, MD  Darbepoetin Alfa (ARANESP) 60 MCG/0.3ML SOSY injection Inject 0.3 mLs (60 mcg total) into the vein every Friday with hemodialysis. 02/02/21   Riesa Pope, MD  doxercalciferol (HECTOROL) 4 MCG/2ML injection Inject 2 mLs  (4 mcg total) into the vein every Monday, Wednesday, and Friday with hemodialysis. 01/30/21   Katsadouros, Vasilios, MD  midodrine (PROAMATINE) 10 MG tablet Take 1 tablet (10 mg total) by mouth 3 (three) times daily with meals. 01/30/21   Katsadouros, Vasilios, MD  pentafluoroprop-tetrafluoroeth [place]) AERO Apply 1 application topically as needed (topical anesthesia for hemodialysis). 01/30/21   Riesa Pope, MD  predniSONE (DELTASONE) 20 MG tablet 3 po once a day for 3 days, then 2 po once a day for 3 days, then 1 po once a day for 3 days 03/07/21   Lajean Saver, MD  VELPHORO 500 MG chewable tablet Chew 1,000 mg by mouth 3 (three) times daily with meals. 08/18/19   [provider]    Physical Exam:  Constitutional: Elderly male who appears to be in acute pain Vitals:   03/15/21 0600 03/15/21 0615 03/15/21 0700 03/15/21 0853  BP: 130/65 124/63 (!) 114/57 (!) 148/65  Pulse: 63 63 60   Resp: 11 (!) 0 (!) 9 14  Temp:    97.9 F (36.6 C)  TempSrc:    Oral  SpO2: 97% 98% 97% 100%  Weight:      Height:       Eyes: PERRL, mild swelling noted over the left eyelid without significant erythema appreciated.  Conjunctive otherwise are clear. ENMT: Mucous membranes are moist. Posterior pharynx clear of any exudate or lesions. Poor dentition Neck: normal, supple, no masses, no thyromegaly.  Mild JVD appreciated. Respiratory: clear to auscultation bilaterally, no wheezing, no crackles. Normal respiratory effort. No accessory muscle use.  Cardiovascular: Regular rate and rhythm, no murmurs / rubs / gallops. No extremity edema. 2+ pedal pulses.  Left upper extremity fistula present with palpable thrill Abdomen: no tenderness, no masses palpated. No hepatosplenomegaly. Bowel sounds positive.  Musculoskeletal: no clubbing / cyanosis.  Tenderness to palpation lumbar region of the back.  Patient complains of worsening pain with elevation of the right leg. Skin: Skin changes noted of the  lower extremities Neurologic: CN 2-12 grossly intact. Sensation intact, DTR normal. Strength 5/5 in all 4.  Psychiatric: Normal judgment and insight. Alert and oriented x 3. Normal mood.     Labs on Admission: I have personally reviewed following labs and imaging studies  CBC: Recent Labs  Lab 03/14/21 2259  WBC 15.1*  NEUTROABS 12.8*  HGB 10.0*  HCT 32.0*  MCV 95.8  PLT 144   Basic Metabolic Panel: Recent Labs  Lab 03/14/21 2259  NA 139  K 4.4  CL 94*  CO2 25  GLUCOSE 173*  BUN 63*  CREATININE 8.50*  CALCIUM 7.9*   GFR: Estimated Creatinine Clearance: 7.3 mL/min (A) (by C-G formula based on SCr of 8.5 mg/dL (H)). Liver Function Tests: Recent Labs  Lab 03/14/21 2259  AST 21  ALT 9  ALKPHOS 107  BILITOT 1.2  PROT 7.1  ALBUMIN 3.4*   No results for input(s): LIPASE, AMYLASE in the last 168 hours. No results for input(s): AMMONIA in the last 168 hours. Coagulation Profile: No results for input(s): INR, PROTIME in the last 168 hours. Cardiac Enzymes: No results for input(s): CKTOTAL, CKMB, CKMBINDEX, TROPONINI in the last 168 hours. BNP (last 3 results) No results for input(s): PROBNP in the last 8760 hours. HbA1C: No results for input(s): HGBA1C in the last 72 hours. CBG: No results for input(s): GLUCAP in the last 168 hours. Lipid Profile: No results for input(s): CHOL, HDL, LDLCALC, TRIG, CHOLHDL, LDLDIRECT in the last 72 hours. Thyroid Function Tests: No results for input(s): TSH, T4TOTAL, FREET4, T3FREE, THYROIDAB in the last 72 hours. Anemia Panel: No results for input(s): VITAMINB12, FOLATE, FERRITIN, TIBC, IRON, RETICCTPCT in the last 72 hours. Urine analysis:    Component Value Date/Time   COLORURINE YELLOW 06/06/2019 1530   APPEARANCEUR CLEAR 06/06/2019 1530   LABSPEC 1.014 06/06/2019 1530   PHURINE 6.0 06/06/2019 1530   GLUCOSEU 150 (A) 06/06/2019 1530   HGBUR SMALL (A) 06/06/2019 1530   BILIRUBINUR NEGATIVE 06/06/2019 1530   BILIRUBINUR  neg 12/19/2016 1512   KETONESUR NEGATIVE 06/06/2019 1530   PROTEINUR >=300 (A) 06/06/2019 1530   UROBILINOGEN 0.2 12/19/2016 1512   UROBILINOGEN 1.0 03/29/2010 1944   NITRITE NEGATIVE 06/06/2019 1530   LEUKOCYTESUR NEGATIVE 06/06/2019 1530   Sepsis Labs: Recent Results (from the past 240 hour(s))  Resp Panel by RT-PCR (Flu A&B, Covid) Nasopharyngeal Swab     Status: None   Collection Time: 03/15/21  2:08 AM   Specimen: Nasopharyngeal Swab; Nasopharyngeal(NP) swabs in vial transport medium  Result Value Ref Range Status   SARS Coronavirus 2 by RT PCR NEGATIVE NEGATIVE Final    Comment: (NOTE) SARS-CoV-2 target nucleic acids are NOT DETECTED.  The SARS-CoV-2 RNA is generally detectable in upper respiratory specimens during the acute phase of infection. The lowest concentration of SARS-CoV-2 viral copies this assay can detect is 138 copies/mL. A negative result does not preclude SARS-Cov-2 infection and should not be used as the sole basis for treatment or other patient management decisions. A negative result may occur with  improper specimen collection/handling, submission of specimen other than nasopharyngeal swab, presence of viral mutation(s) within the areas targeted by this assay, and inadequate number of viral copies(<138 copies/mL). A negative result must be combined with clinical observations, patient history, and epidemiological information. The expected result is Negative.  Fact Sheet for Patients:  https://www.fda.gov/media/152166/download  Fact Sheet for Healthcare Providers:  https://www.fda.gov/media/152162/download  This test is no t yet approved or cleared by the [place] FDA and  has been authorized for detection and/or diagnosis of SARS-CoV-2 by FDA under an Emergency Use Authorization (EUA). This EUA will remain  in effect (meaning this test can be used) for the duration of the COVID-19 declaration under Section 564(b)(1) of the Act, 21 U.S.C.section  360bbb-3(b)(1), unless the authorization is terminated  or revoked sooner.       Influenza A by PCR NEGATIVE NEGATIVE Final   Influenza B by PCR NEGATIVE NEGATIVE Final    Comment: (NOTE) The Xpert Xpress SARS-CoV-2/FLU/RSV plus assay is intended as an aid in the diagnosis of influenza from Nasopharyngeal swab specimens and should not be used as a sole basis for treatment. Nasal washings and aspirates are unacceptable for Xpert Xpress SARS-CoV-2/FLU/RSV testing.  Fact Sheet for Patients: https://www.fda.gov/media/152166/download  Fact Sheet for Healthcare Providers: https://www.fda.gov/media/152162/download  This test is   not yet approved or cleared by the [place] and has been authorized for detection and/or diagnosis of SARS-CoV-2 by FDA under an Emergency Use Authorization (EUA). This EUA will remain in effect (meaning this test can be used) for the duration of the COVID-19 declaration under Section 564(b)(1) of the Act, 21 U.S.C. section 360bbb-3(b)(1), unless the authorization is terminated or revoked.  Performed at [facility], [address] [place], [place] [address]      Radiological Exams on Admission: DG Chest 1 View  Result Date: 03/15/2021 CLINICAL DATA:  65 year old male with generalized pain. EXAM: CHEST  1 VIEW COMPARISON:  Chest radiograph dated 01/28/2021. FINDINGS: Cardiomegaly with vascular congestion. Focal area of subpleural nodularity along the lateral right mid lung field likely corresponds to the previously seen cavitary lesion. Overall the previously seen nodules are less conspicuous on today's study. There is a small right pleural effusion or pleural thickening. No pneumothorax. No acute osseous pathology. Degenerative changes of the spine. Partially visualized cervical ACDF and left axillary vascular stent. No acute osseous pathology. IMPRESSION: 1. Cardiomegaly with vascular congestion. 2. Focal area of subpleural  nodularity along the right mid lung field likely corresponds to the previously seen cavitary lesion. 3. Small right pleural effusion. Electronically Signed   By: Anner Crete M.D.   On: 03/15/2021 00:05   CT HEAD WO CONTRAST (5MM)  Result Date: 03/15/2021 CLINICAL DATA:  65 year old male with ocular pain and swelling over the left orbit. Facial trauma. EXAM: CT HEAD AND ORBITS WITHOUT CONTRAST TECHNIQUE: Contiguous axial images were obtained from the base of the skull through the vertex without contrast. Multidetector CT imaging of the orbits was performed using the standard protocol without intravenous contrast. COMPARISON:  None FINDINGS: CT HEAD FINDINGS Brain: There is mild age-related atrophy and chronic microvascular ischemic changes. There is no acute intracranial hemorrhage. No mass effect or midline shift. No extra-axial fluid collection. Vascular: No hyperdense vessel or unexpected calcification. Skull: Normal. Negative for fracture or focal lesion. Other: None CT ORBITS FINDINGS Evaluation of this exam is limited in the absence of intravenous contrast. Orbits: The globes and retro-orbital fat are preserved. No acute or traumatic injury. Visualized sinuses: There is mild diffuse mucoperiosteal thickening of paranasal sinuses. No air-fluid level. Mild bilateral mastoid effusions. Soft tissues: Left facial and periorbital soft tissue contusion. No large hematoma. IMPRESSION: 1. No acute intracranial pathology. 2. No acute/traumatic orbital pathology. 3. Left facial and periorbital soft tissue contusion. Electronically Signed   By: Anner Crete M.D.   On: 03/15/2021 00:34   CT Orbits Wo Contrast  Result Date: 03/15/2021 CLINICAL DATA:  65 year old male with ocular pain and swelling over the left orbit. Facial trauma. EXAM: CT HEAD AND ORBITS WITHOUT CONTRAST TECHNIQUE: Contiguous axial images were obtained from the base of the skull through the vertex without contrast. Multidetector CT imaging  of the orbits was performed using the standard protocol without intravenous contrast. COMPARISON:  None FINDINGS: CT HEAD FINDINGS Brain: There is mild age-related atrophy and chronic microvascular ischemic changes. There is no acute intracranial hemorrhage. No mass effect or midline shift. No extra-axial fluid collection. Vascular: No hyperdense vessel or unexpected calcification. Skull: Normal. Negative for fracture or focal lesion. Other: None CT ORBITS FINDINGS Evaluation of this exam is limited in the absence of intravenous contrast. Orbits: The globes and retro-orbital fat are preserved. No acute or traumatic injury. Visualized sinuses: There is mild diffuse mucoperiosteal thickening of paranasal sinuses. No air-fluid level. Mild bilateral mastoid effusions.  Soft tissues: Left facial and periorbital soft tissue contusion. No large hematoma. IMPRESSION: 1. No acute intracranial pathology. 2. No acute/traumatic orbital pathology. 3. Left facial and periorbital soft tissue contusion. Electronically Signed   By: Anner Crete M.D.   On: 03/15/2021 00:34    EKG: Independently reviewed.  Sinus rhythm at 72 bpm  Assessment/Plan  Lumbar back pain and leg pain possible septic arthritis of the lumbar spine: Acute.  Patient presents with acute worsening lumbar back pain with radiation down both legs to the point where he is unable to walk.  Patient with significant tenderness palpation of the lumbar spine or legs.  He makes it seem as though pain symptoms may be radiating down his legs. MRI of the lumbar spine from 01/17/2021 significant for severe bilateral foraminal stenosis at L5-S1, moderate bilateral stenosis at L4-L5, bilateral facet osteoarthritis worse on the left, and noted to have posteriorly projecting synovial cyst with periarticular soft tissue edema on the left for which there was concern for the possibility of superimposed septic arthritis. -Admit to a medical telemetry bed -Check blood  cultures -Check ESR and CRP -Oxycodone as needed for pain -Continue empiric antibiotics of Rocephin IV -Check MRI of the lumbar spine without contrast -PT/OT to eval and treat tomorrow morning -Case discussed Dr. Glenford Peers with neurosurgery,  will follow-up for any further recommendations -Consider need to formally consult ID  Leukocytosis: WBC elevated 15.1 and otherwise noted to be afebrile.  Question of possibility of underlying infection. -Recheck CBC tomorrow morning  Left face contusion: Acute.  Patient was noted to have left facial and periorbital soft tissue contusion on CT imaging.  It appears he had initially been given ciprofloxacin and Rocephin IV for concern for periorbital cellulitis with concern for bacterial conjunctivitis.  However, on physical exam patient without significant erythema pain at this time. -Continue supportive care for now  Elevated troponin: Acute.  Patient denies any complaints of chest pain.  High-sensitivity troponin 84 -101.  EKG without significant ischemic changes.  Suspect secondary to demand. -Continue to monitor  Right-sided pleural effusion: Patient noted to have cardiomegaly  with vascular congestion and small left-sided pleural effusion.  Patient did not appear to be grossly fluid overloaded on physical exam.  ESRD on HD: Patient had missed hemodialysis yesterday due to having significant pain, but labs appear to be relatively stable. -HD per nephro  Diabetes mellitus type 2: Patient well controlled not on any medications for treatment.  Last hemoglobin A1c was 5.1 on 01/16/2021. -Continue renal and carb modified diet  History of MSSA bacteremia: Patient had been treated and completed his 6 weeks of antibiotics from his previous hospitalization in June.  It appears he had no signs of endocarditis at that time her TEE.  Anemia chronic kidney disease: Hemoglobin 10 g/dL which appears improved from previous, but baseline appears to be 8  g/dL. -Continue to monitor  Subclinical hypothyroidism: Patient previously had TSH -16.498 free T4- 0.91 in 03/2018, but was not ever placed on any medication for treatment. -Check TSH and free T4  DVT prophylaxis: Heparin Code Status: Full Family Communication: Son updated at bedside Disposition Plan: To be determined Consults called: Nephrology, neurosurgery Admission status: Inpatient  Norval Morton MD [facility]   If 7PM-7AM, please contact night-coverage   03/15/2021, 11:10 AM

## 2021-03-15 NOTE — Consult Note (Addendum)
[facility] Renal Consultation Note    Indication for Consultation:  Management of ESRD/hemodialysis; anemia, hypertension/volume and secondary hyperparathyroidism PCP: Carlynn Purl, MD  HPI: Ryan Wells is a 65 y.o. male with ESRD on hemodialysis MWF at [facility]. PMH: DMT2, HTN, anemia of ESRD, SHPT. MRSA bacteremia. Recent F/U with ID.  Last HD 03/12/2021. Ran full treatment left 0.4 kg under EDW. Usually compliant with HD Rx.   Patient present to [facility] with C/O back and BLE pain. Noted to have discharge L eye. WBC 15.1 HGB 10 PLT 212 Na 139 K+ 4.4 CO2 25 SCr 8.5 Ca 7.9. CT of head W/O contrast without acute abnormalities. Left facial and periorbital soft tissue contusion present. CT of orbits: No acute/traumatic orbital pathology. CXR with vascular congestion, small R pleural effusion. He has been admitted for pulmonary edema, preseptal cellulitis and acute bacterial conjunctivitis. Has been started on Ceftriaxone IV/Cipro ophthalmic solution per primary. We have been asked to manage hemodialysis.   Seen in room with assistance from remote interpretor. Driving issue seems to be issues with back pain. He says that it is so severe that he has not been eating, can barely walk. Says he missed HD 03/14/2021 because he just did not believe he could make it to unit. Denies injury or trauma to eyes, says he awoke earlier this week and R eye had drainage, now it's the left eye. He denies SOB, Chest pain, F,C, N, V,D. He does occasionally urinate but says no urine this week. Denies flank pain/dysuria. Is concerned about getting dialysis. His lungs are clear with no WOB. On RA. K+ is controlled. Will have HD in AM on his regular schedule. 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  Did not get outpatient stress testing following hospitalization.  No chest pain since   Chronic kidney disease    on dialysis Tues, Thurs and Sat   Diabetes mellitus without complication ([place])    Diabetes type 2, uncontrolled ([place]) 11/25/2011   no meds, diet controlled per patient   Diabetic gastroparesis ([place]) 01/22/2017   Hypertension    no meds   Microalbuminuria 01/19/2017   Myocardial infarction [facility]) 2015   Past Surgical History:  Procedure Laterality Date   A/V FISTULAGRAM Left 06/04/2019   Procedure: A/V FISTULAGRAM;  Surgeon: Angelia Mould, MD;  Location: [place] [facility];  Service: Cardiovascular;  Laterality: Left;   A/V FISTULAGRAM Left 10/15/2019   Procedure: A/V FISTULAGRAM;  Surgeon: Angelia Mould, MD;  Location: [place] [facility];  Service: Cardiovascular;  Laterality: Left;   A/V FISTULAGRAM N/A 01/24/2021   Procedure: A/V FISTULAGRAM - Left Upper;  Surgeon: Cherre Robins, MD;  Location: [place] [facility];  Service: Cardiovascular;  Laterality: N/A;   AV FISTULA PLACEMENT Left 07/02/2018   Procedure: BRACHIOCEPHALIC ARTERIOVENOUS (AV) FISTULA CREATION LEFT ARM;  Surgeon: Serafina Mitchell, MD;  Location: [place];  Service: Vascular;  Laterality: Left;   Sidney Left 07/26/2019   Procedure: Bascilic Vein Transposition;  Surgeon: Angelia Mould, MD;  Location: [facility] OR;  Service: Vascular;  Laterality: Left;   COLONOSCOPY  EMBOLIZATION Left 06/04/2019   Procedure: EMBOLIZATION;  Surgeon: Angelia Mould, MD;  Location: [place] CV [facility];  Service: Cardiovascular;  Laterality: Left;  LT ARM FISTULA/COMPETING BRANCH   EYE SURGERY Bilateral    cataracts x2   EYE SURGERY     INSERTION OF DIALYSIS CATHETER Right 06/08/2019   Procedure: INSERTION OF PALINDROME DIALYSIS CATHETER IN RIGHT INTERNAL  JUGULAR;  Surgeon: Angelia Mould, MD;  Location: [place];  Service: Vascular;  Laterality: Right;   LIGATION OF ARTERIOVENOUS  FISTULA Left 07/26/2019   Procedure: Ligation Of BrachioCephalic  Fistula;  Surgeon: Angelia Mould, MD;  Location: [place];  Service: Vascular;  Laterality: Left;   NECK SURGERY     PERIPHERAL VASCULAR BALLOON ANGIOPLASTY Left 06/04/2019   Procedure: PERIPHERAL VASCULAR BALLOON ANGIOPLASTY;  Surgeon: Angelia Mould, MD;  Location: [place] [facility];  Service: Cardiovascular;  Laterality: Left;  ARM FISTULA   PERIPHERAL VASCULAR BALLOON ANGIOPLASTY Left 10/15/2019   Procedure: PERIPHERAL VASCULAR BALLOON ANGIOPLASTY;  Surgeon: Angelia Mould, MD;  Location: [place] [facility];  Service: Cardiovascular;  Laterality: Left;  arm fistula   TEE WITHOUT CARDIOVERSION N/A 01/19/2021   Procedure: TRANSESOPHAGEAL ECHOCARDIOGRAM (TEE);  Surgeon: Jerline Pain, MD;  Location: [facility] ENDOSCOPY;  Service: Cardiovascular;  Laterality: N/A;   Family History  Problem Relation Age of Onset   Heart disease Mother        cause of death--CHF?   Diabetes Father    Kidney disease Father        Dialysis for kidney failure   Diabetes Sister    Diabetes Brother    Colon cancer Neg Hx    Stomach cancer Neg Hx    Esophageal cancer Neg Hx    Social History:  reports that he has quit smoking. He has never used smokeless tobacco. He reports previous alcohol use. He reports that he does not use drugs. No Known Allergies Prior to Admission medications   Medication Sig Start Date End Date Taking? Authorizing Provider  oxyCODONE-acetaminophen (PERCOCET/ROXICET) 5-325 MG tablet Take 1 tablet by mouth every 6 (six) hours as needed for severe pain. 03/06/21  Yes Lajean Saver, MD  ceFAZolin (ANCEF) 2-4 GM/100ML-% IVPB Inject 100 mLs (2 g total) into the vein every Monday, Wednesday, and Friday at 6 PM. 01/30/21   Riesa Pope, MD  Darbepoetin Alfa (ARANESP) 60  MCG/0.3ML SOSY injection Inject 0.3 mLs (60 mcg total) into the vein every Friday with hemodialysis. 02/02/21   Riesa Pope, MD  doxercalciferol (HECTOROL) 4 MCG/2ML injection Inject 2 mLs (4 mcg total) into the vein every Monday, Wednesday, and Friday with hemodialysis. 01/30/21   Katsadouros, Vasilios, MD  midodrine (PROAMATINE) 10 MG tablet Take 1 tablet (10 mg total) by mouth 3 (three) times daily with meals. 01/30/21   Katsadouros, Vasilios, MD  pentafluoroprop-tetrafluoroeth Landry Dyke) AERO Apply 1 application topically as needed (topical anesthesia for hemodialysis). 01/30/21   Riesa Pope, MD  predniSONE (DELTASONE) 20 MG tablet 3 po once a day for 3 days, then 2 po once a day for 3 days, then 1 po once a day for 3 days 03/07/21   Lajean Saver, MD  VELPHORO 500 MG chewable tablet Chew 1,000 mg by mouth 3 (three) times daily with meals. 08/18/19   [provider]   Current Facility-Administered Medications  Medication Dose Route Frequency Provider Last Rate Last Admin   acetaminophen (TYLENOL) tablet 650 mg  650 mg Oral Q6H PRN Norval Morton, MD  Or   acetaminophen (TYLENOL) suppository 650 mg  650 mg Rectal Q6H PRN Fuller Plan A, MD       albuterol (PROVENTIL) (2.5 MG/3ML) 0.083% nebulizer solution 2.5 mg  2.5 mg Nebulization Q6H PRN Fuller Plan A, MD       heparin injection 5,000 Units  5,000 Units Subcutaneous Q8H Smith, Rondell A, MD       midodrine (PROAMATINE) tablet 10 mg  10 mg Oral TID WC Smith, Rondell A, MD       sodium chloride flush (NS) 0.9 % injection 3 mL  3 mL Intravenous Q12H Norval Morton, MD       Labs: Basic Metabolic Panel: Recent Labs  Lab 03/14/21 2259  NA 139  K 4.4  CL 94*  CO2 25  GLUCOSE 173*  BUN 63*  CREATININE 8.50*  CALCIUM 7.9*   Liver Function Tests: Recent Labs  Lab 03/14/21 2259  AST 21  ALT 9  ALKPHOS 107  BILITOT 1.2  PROT 7.1  ALBUMIN 3.4*   No results for input(s): LIPASE, AMYLASE in the  last 168 hours. No results for input(s): AMMONIA in the last 168 hours. CBC: Recent Labs  Lab 03/14/21 2259  WBC 15.1*  NEUTROABS 12.8*  HGB 10.0*  HCT 32.0*  MCV 95.8  PLT 212   Cardiac Enzymes: No results for input(s): CKTOTAL, CKMB, CKMBINDEX, TROPONINI in the last 168 hours. CBG: No results for input(s): GLUCAP in the last 168 hours. Iron Studies: No results for input(s): IRON, TIBC, TRANSFERRIN, FERRITIN in the last 72 hours. Studies/Results: DG Chest 1 View  Result Date: 03/15/2021 CLINICAL DATA:  65 year old male with generalized pain. EXAM: CHEST  1 VIEW COMPARISON:  Chest radiograph dated 01/28/2021. FINDINGS: Cardiomegaly with vascular congestion. Focal area of subpleural nodularity along the lateral right mid lung field likely corresponds to the previously seen cavitary lesion. Overall the previously seen nodules are less conspicuous on today's study. There is a small right pleural effusion or pleural thickening. No pneumothorax. No acute osseous pathology. Degenerative changes of the spine. Partially visualized cervical ACDF and left axillary vascular stent. No acute osseous pathology. IMPRESSION: 1. Cardiomegaly with vascular congestion. 2. Focal area of subpleural nodularity along the right mid lung field likely corresponds to the previously seen cavitary lesion. 3. Small right pleural effusion. Electronically Signed   By: Anner Crete M.D.   On: 03/15/2021 00:05   CT HEAD WO CONTRAST (5MM)  Result Date: 03/15/2021 CLINICAL DATA:  65 year old male with ocular pain and swelling over the left orbit. Facial trauma. EXAM: CT HEAD AND ORBITS WITHOUT CONTRAST TECHNIQUE: Contiguous axial images were obtained from the base of the skull through the vertex without contrast. Multidetector CT imaging of the orbits was performed using the standard protocol without intravenous contrast. COMPARISON:  None FINDINGS: CT HEAD FINDINGS Brain: There is mild age-related atrophy and chronic  microvascular ischemic changes. There is no acute intracranial hemorrhage. No mass effect or midline shift. No extra-axial fluid collection. Vascular: No hyperdense vessel or unexpected calcification. Skull: Normal. Negative for fracture or focal lesion. Other: None CT ORBITS FINDINGS Evaluation of this exam is limited in the absence of intravenous contrast. Orbits: The globes and retro-orbital fat are preserved. No acute or traumatic injury. Visualized sinuses: There is mild diffuse mucoperiosteal thickening of paranasal sinuses. No air-fluid level. Mild bilateral mastoid effusions. Soft tissues: Left facial and periorbital soft tissue contusion. No large hematoma. IMPRESSION: 1. No acute intracranial pathology. 2. No acute/traumatic orbital pathology. 3. Left  facial and periorbital soft tissue contusion. Electronically Signed   By: Anner Crete M.D.   On: 03/15/2021 00:34   CT Orbits Wo Contrast  Result Date: 03/15/2021 CLINICAL DATA:  65 year old male with ocular pain and swelling over the left orbit. Facial trauma. EXAM: CT HEAD AND ORBITS WITHOUT CONTRAST TECHNIQUE: Contiguous axial images were obtained from the base of the skull through the vertex without contrast. Multidetector CT imaging of the orbits was performed using the standard protocol without intravenous contrast. COMPARISON:  None FINDINGS: CT HEAD FINDINGS Brain: There is mild age-related atrophy and chronic microvascular ischemic changes. There is no acute intracranial hemorrhage. No mass effect or midline shift. No extra-axial fluid collection. Vascular: No hyperdense vessel or unexpected calcification. Skull: Normal. Negative for fracture or focal lesion. Other: None CT ORBITS FINDINGS Evaluation of this exam is limited in the absence of intravenous contrast. Orbits: The globes and retro-orbital fat are preserved. No acute or traumatic injury. Visualized sinuses: There is mild diffuse mucoperiosteal thickening of paranasal sinuses. No  air-fluid level. Mild bilateral mastoid effusions. Soft tissues: Left facial and periorbital soft tissue contusion. No large hematoma. IMPRESSION: 1. No acute intracranial pathology. 2. No acute/traumatic orbital pathology. 3. Left facial and periorbital soft tissue contusion. Electronically Signed   By: Anner Crete M.D.   On: 03/15/2021 00:34    ROS: As per HPI otherwise negative.  Physical Exam: Vitals:   03/15/21 0600 03/15/21 0615 03/15/21 0700 03/15/21 0853  BP: 130/65 124/63 (!) 114/57 (!) 148/65  Pulse: 63 63 60   Resp: 11 (!) 0 (!) 9 14  Temp:    97.9 F (36.6 C)  TempSrc:    Oral  SpO2: 97% 98% 97% 100%  Weight:      Height:         General: Pleasant older non English speaking male in no acute distress. Head: Normocephalic, atraumatic, sclera non-icteric, mucus membranes are moist Neck: Supple. JVD not elevated. Lungs: Clear bilaterally to auscultation without wheezes, rales, or rhonchi. Breathing is unlabored. Heart: RRR with S1 S2. No murmurs, rubs, or gallops appreciated. Abdomen: Soft, non-tender, non-distended with normoactive bowel sounds. No rebound/guarding. No obvious abdominal masses. M-S:  Strength and tone appear normal for age. Lower extremities:without edema or ischemic changes, no open wounds  Neuro: Alert and oriented X 3. Moves all extremities spontaneously. Psych:  Responds to questions appropriately with a normal affect. Dialysis Access: L AVF +T/B  Dialysis Orders:[facility] M[facility]  3h 82mn  180NRe 400/600  64kg   2/2 bath  P4  AVF   -Heparin  2200 units IV TIW -Mircera 150 mcg IV q 2 week (last dose 150 mcg IV 02/28/2021) -Venofer 100 mg IV X 10 doses (4/6 doses given.  -Hectorol 3 mcg IV TIW   Assessment/Plan: Pain in Back/BLE-rates as severe and interfering with QOL. Per primary.  Concern that this pain is related to his recent bacteremia-  possibly a discitis or osteomyelitis-  looks like hospitalist is thinking the same Pulmonary edema: Vascular  congestion, small R pleural effusion on CXR but appears mild in comparison to CXR 01/2021. He has not been eating well, has most likely lost body wt. No acute dialysis needs today. No evidence of overt volume overload.  Will attempt to lower volume as tolerated in HD tomorrow.  Elevated troponin: In setting of vascular congestion and ESRD. Troponin 84, 101 so far. Denies chest pain. Per primary Preseptal cellulitis with bacterial conjunctivitis-per primary  ESRD -  Missed HD 03/14/21. Next  HD 03/16/2021. 2.0 K bath-K+ 4.4. Low dose heparin. SCr 8.5 BUN 63. No evidence of uremia.   Hypertension/volume  - As noted above. BP well controlled. On midodrine 10 mg PO TID. No evidence of overt volume overload by exam.  Lower EDW as tolerated.   Anemia  - HGB 10-higher here than OP clinic. Missed OP ESA 08/03. Give tomorrow with HD. Continue Fe load.   Metabolic bone disease - OP labs have been at goal. C Ca 8.3. Continue velphoro 500 mg 2 tabs PO TID AC. Continue VDRA.   Nutrition - Renal carb mod diet, nepro, renal vits.  DM-No meds on OP list. Last HA1c 6.9. per primary  Rita H. Owens Shark, NP-C 03/15/2021, 12:01 PM  [employer] [phone]  Patient seen and examined, agree with above note with above modifications. Compliant HD patient with MSSA bacteremia about 6 weeks ago- treated but now with debilitating back and leg pain-  suspicious for discitis/osteomyelitis.  We will plan for HD on schedule tomorrow and to follow all dialysis related meds  Corliss Parish, MD 03/15/2021

## 2021-03-15 NOTE — ED Notes (Addendum)
Called [facility] to transport patient to [place] rm# [id number]

## 2021-03-15 NOTE — Consult Note (Signed)
Reason for Consult:Low back pain Referring Physician: Norval Morton, MD    HPI: Ryan Wells is a 65 y.o. male with a past medical history significant for ESRD on HD MWF, hypertension, diabetes mellitus type 2, anemia of chronic disease, and MSSA bacteremia. He presented to the ED on 03/14/2021 due to progressively worsening low back pain that radiates into his BLE and into his bilateral feet. He reports that the pain affects his entire foot bilaterally. He also reports paresthesia in his BLE. He reported that his symptoms became more severe approximately 2 weeks ago while undergoing HD. He currently rates his pain to be a 6-8/10. He is having a difficult time with ambulation due to the pain. He denies any incontinence (makes small amt urine at baseline) or saddle anesthesia. The patient was seen and evaluated in the ED on 03/05/21 for similar symptoms. It was recommended the patient do a course of PO steroids, PT, and follow up as an outpatient.  The patient reports that he has been unable to do PT due to his pain level. He has not followed up as an outpatient. He was hospitalized in June with septic emboli - states completed antibiotic therapy, denies fever/chills/sweats. No new, acute or different pain.   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  Did not get outpatient stress testing following hospitalization.  No chest pain since   Chronic kidney disease    on dialysis Tues, Thurs and Sat   Diabetes mellitus without complication ([place])    Diabetes type 2, uncontrolled ([place]) 11/25/2011   no meds, diet controlled per patient   Diabetic gastroparesis ([place]) 01/22/2017   Hypertension    no meds   Microalbuminuria 01/19/2017   Myocardial infarction [facility]) 2015    Past Surgical History:  Procedure Laterality Date   A/V  FISTULAGRAM Left 06/04/2019   Procedure: A/V FISTULAGRAM;  Surgeon: Angelia Mould, MD;  Location: [place] [facility];  Service: Cardiovascular;  Laterality: Left;   A/V FISTULAGRAM Left 10/15/2019   Procedure: A/V FISTULAGRAM;  Surgeon: Angelia Mould, MD;  Location: [place] [facility];  Service: Cardiovascular;  Laterality: Left;   A/V FISTULAGRAM N/A 01/24/2021   Procedure: A/V FISTULAGRAM - Left Upper;  Surgeon: Cherre Robins, MD;  Location: [place] [facility];  Service: Cardiovascular;  Laterality: N/A;   AV FISTULA PLACEMENT Left 07/02/2018   Procedure: BRACHIOCEPHALIC ARTERIOVENOUS (AV) FISTULA CREATION LEFT ARM;  Surgeon: Serafina Mitchell, MD;  Location: [place];  Service: Vascular;  Laterality: Left;   [place] Left 07/26/2019   Procedure: Bascilic Vein Transposition;  Surgeon: Angelia Mould, MD;  Location: [place];  Service: Vascular;  Laterality: Left;   COLONOSCOPY     EMBOLIZATION Left 06/04/2019   Procedure: EMBOLIZATION;  Surgeon: Angelia Mould, MD;  Location: [place] [facility];  Service: Cardiovascular;  Laterality: Left;  LT ARM FISTULA/COMPETING BRANCH   EYE SURGERY Bilateral    cataracts x2   EYE SURGERY     INSERTION OF DIALYSIS CATHETER Right 06/08/2019   Procedure: INSERTION OF PALINDROME DIALYSIS CATHETER IN RIGHT INTERNAL JUGULAR;  Surgeon: Angelia Mould, MD;  Location: [place];  Service: Vascular;  Laterality: Right;   LIGATION OF ARTERIOVENOUS  FISTULA Left 07/26/2019   Procedure: Ligation Of BrachioCephalic  Fistula;  Surgeon: Angelia Mould, MD;  Location: [place];  Service: Vascular;  Laterality: Left;   NECK SURGERY     PERIPHERAL VASCULAR BALLOON ANGIOPLASTY Left 06/04/2019   Procedure: PERIPHERAL VASCULAR BALLOON ANGIOPLASTY;  Surgeon: Angelia Mould, MD;  Location: [place] [facility];  Service: Cardiovascular;  Laterality: Left;  ARM FISTULA   PERIPHERAL VASCULAR BALLOON ANGIOPLASTY Left  10/15/2019   Procedure: PERIPHERAL VASCULAR BALLOON ANGIOPLASTY;  Surgeon: Angelia Mould, MD;  Location: [place] [facility];  Service: Cardiovascular;  Laterality: Left;  arm fistula   TEE WITHOUT CARDIOVERSION N/A 01/19/2021   Procedure: TRANSESOPHAGEAL ECHOCARDIOGRAM (TEE);  Surgeon: Jerline Pain, MD;  Location: [facility] ENDOSCOPY;  Service: Cardiovascular;  Laterality: N/A;    Family History  Problem Relation Age of Onset   Heart disease Mother        cause of death--CHF?   Diabetes Father    Kidney disease Father        Dialysis for kidney failure   Diabetes Sister    Diabetes Brother    Colon cancer Neg Hx    Stomach cancer Neg Hx    Esophageal cancer Neg Hx     Social History:  reports that he has quit smoking. He has never used smokeless tobacco. He reports previous alcohol use. He reports that he does not use drugs.  Allergies: No Known Allergies  Medications: I have reviewed the patient's current medications.  Results for orders placed or performed during the hospital encounter of 03/14/21 (from the past 48 hour(s))  Comprehensive metabolic panel     Status: Abnormal   Collection Time: 03/14/21 10:59 PM  Result Value Ref Range   Sodium 139 135 - 145 mmol/L   Potassium 4.4 3.5 - 5.1 mmol/L   Chloride 94 (L) 98 - 111 mmol/L   CO2 25 22 - 32 mmol/L   Glucose, Bld 173 (H) 70 - 99 mg/dL    Comment: Glucose reference range applies only to samples taken after fasting for at least 8 hours.   BUN 63 (H) 8 - 23 mg/dL   Creatinine, Ser 8.50 (H) 0.61 - 1.24 mg/dL   Calcium 7.9 (L) 8.9 - 10.3 mg/dL   Total Protein 7.1 6.5 - 8.1 g/dL   Albumin 3.4 (L) 3.5 - 5.0 g/dL   AST 21 15 - 41 U/L   ALT 9 0 - 44 U/L   Alkaline Phosphatase 107 38 - 126 U/L   Total Bilirubin 1.2 0.3 - 1.2 mg/dL   GFR, Estimated 6 (L) >60 mL/min    Comment: (NOTE) Calculated using the CKD-EPI Creatinine Equation (2021)    Anion gap 20 (H) 5 - 15    Comment: Performed at [facility],  [place], [place] [address]  CBC with Differential     Status: Abnormal   Collection Time: 03/14/21 10:59 PM  Result Value Ref Range   WBC 15.1 (H) 4.0 - 10.5 K/uL   RBC 3.34 (L) 4.22 - 5.81 MIL/uL   Hemoglobin 10.0 (L) 13.0 - 17.0 g/dL   HCT 32.0 (L) 39.0 - 52.0 %   MCV 95.8 80.0 - 100.0 fL   MCH 29.9 26.0 - 34.0 pg   MCHC 31.3 30.0 - 36.0 g/dL   RDW 18.5 (H) 11.5 - 15.5 %   Platelets 212 150 - 400 K/uL   nRBC 0.1 0.0 - 0.2 %   Neutrophils Relative % 85 %   Neutro Abs 12.8 (H) 1.7 - 7.7  K/uL   Lymphocytes Relative 8 %   Lymphs Abs 1.3 0.7 - 4.0 K/uL   Monocytes Relative 6 %   Monocytes Absolute 0.9 0.1 - 1.0 K/uL   Eosinophils Relative 0 %   Eosinophils Absolute 0.0 0.0 - 0.5 K/uL   Basophils Relative 0 %   Basophils Absolute 0.0 0.0 - 0.1 K/uL   Immature Granulocytes 1 %   Abs Immature Granulocytes 0.12 (H) 0.00 - 0.07 K/uL    Comment: Performed at [facility], [place], [place] [address]  Troponin I (High Sensitivity)     Status: Abnormal   Collection Time: 03/14/21 10:59 PM  Result Value Ref Range   Troponin I (High Sensitivity) 84 (H) <18 ng/L    Comment: (NOTE) Elevated high sensitivity troponin I (hsTnI) values and significant  changes across serial measurements may suggest ACS but many other  chronic and acute conditions are known to elevate hsTnI results.  Refer to the "Links" section for chest pain algorithms and additional  guidance. Performed at [facility], [address], [place], [place] [address]   Resp Panel by RT-PCR (Flu A&B, Covid) Nasopharyngeal Swab     Status: None   Collection Time: 03/15/21  2:08 AM   Specimen: Nasopharyngeal Swab; Nasopharyngeal(NP) swabs in vial transport medium  Result Value Ref Range   SARS Coronavirus 2 by RT PCR NEGATIVE NEGATIVE    Comment: (NOTE) SARS-CoV-2 target nucleic acids are NOT DETECTED.  The SARS-CoV-2 RNA is generally detectable in  upper respiratory specimens during the acute phase of infection. The lowest concentration of SARS-CoV-2 viral copies this assay can detect is 138 copies/mL. A negative result does not preclude SARS-Cov-2 infection and should not be used as the sole basis for treatment or other patient management decisions. A negative result may occur with  improper specimen collection/handling, submission of specimen other than nasopharyngeal swab, presence of viral mutation(s) within the areas targeted by this assay, and inadequate number of viral copies(<138 copies/mL). A negative result must be combined with clinical observations, patient history, and epidemiological information. The expected result is Negative.  Fact Sheet for Patients:  EntrepreneurPulse.com.au  Fact Sheet for Healthcare Providers:  IncredibleEmployment.be  This test is no t yet approved or cleared by the [place] FDA and  has been authorized for detection and/or diagnosis of SARS-CoV-2 by FDA under an Emergency Use Authorization (EUA). This EUA will remain  in effect (meaning this test can be used) for the duration of the COVID-19 declaration under Section 564(b)(1) of the Act, 21 U.S.C.section 360bbb-3(b)(1), unless the authorization is terminated  or revoked sooner.       Influenza A by PCR NEGATIVE NEGATIVE   Influenza B by PCR NEGATIVE NEGATIVE    Comment: (NOTE) The Xpert Xpress SARS-CoV-2/FLU/RSV plus assay is intended as an aid in the diagnosis of influenza from Nasopharyngeal swab specimens and should not be used as a sole basis for treatment. Nasal washings and aspirates are unacceptable for Xpert Xpress SARS-CoV-2/FLU/RSV testing.  Fact Sheet for Patients: EntrepreneurPulse.com.au  Fact Sheet for Healthcare Providers: IncredibleEmployment.be  This test is not yet approved or cleared by the [place] FDA and has been authorized  for detection and/or diagnosis of SARS-CoV-2 by FDA under an Emergency Use Authorization (EUA). This EUA will remain in effect (meaning this test can be used) for the duration of the COVID-19 declaration under Section 564(b)(1) of the Act, 21 U.S.C. section 360bbb-3(b)(1), unless the authorization is terminated or revoked.  Performed  at [facility], [place], [place] [address]   Troponin I (High Sensitivity)     Status: Abnormal   Collection Time: 03/15/21  2:23 AM  Result Value Ref Range   Troponin I (High Sensitivity) 101 (HH) <18 ng/L    Comment: CRITICAL RESULT CALLED TO, READ BACK BY AND VERIFIED WITH: KELLY GIBSON 03/15/21 AT 0307 HS (NOTE) Elevated high sensitivity troponin I (hsTnI) values and significant  changes across serial measurements may suggest ACS but many other  chronic and acute conditions are known to elevate hsTnI results.  Refer to the Links section for chest pain algorithms and additional  guidance. Performed at [facility], [address], [place], [place] [address]   Renal function panel     Status: Abnormal   Collection Time: 03/15/21 11:50 AM  Result Value Ref Range   Sodium 142 135 - 145 mmol/L   Potassium 4.0 3.5 - 5.1 mmol/L   Chloride 95 (L) 98 - 111 mmol/L   CO2 27 22 - 32 mmol/L   Glucose, Bld 95 70 - 99 mg/dL    Comment: Glucose reference range applies only to samples taken after fasting for at least 8 hours.   BUN 70 (H) 8 - 23 mg/dL   Creatinine, Ser 9.60 (H) 0.61 - 1.24 mg/dL   Calcium 7.6 (L) 8.9 - 10.3 mg/dL   Phosphorus 9.0 (H) 2.5 - 4.6 mg/dL   Albumin 2.4 (L) 3.5 - 5.0 g/dL   GFR, Estimated 6 (L) >60 mL/min    Comment: (NOTE) Calculated using the CKD-EPI Creatinine Equation (2021)    Anion gap 20 (H) 5 - 15    Comment: REPEATED TO VERIFY Performed at [facility] [address], [place], [place] [address]   C-reactive protein     Status: Abnormal   Collection Time:  03/15/21 11:50 AM  Result Value Ref Range   CRP 22.1 (H) <1.0 mg/dL    Comment: Performed at [facility] [address], [place], [place] [address]    DG Chest 1 View  Result Date: 03/15/2021 CLINICAL DATA:  65 year old male with generalized pain. EXAM: CHEST  1 VIEW COMPARISON:  Chest radiograph dated 01/28/2021. FINDINGS: Cardiomegaly with vascular congestion. Focal area of subpleural nodularity along the lateral right mid lung field likely corresponds to the previously seen cavitary lesion. Overall the previously seen nodules are less conspicuous on today's study. There is a small right pleural effusion or pleural thickening. No pneumothorax. No acute osseous pathology. Degenerative changes of the spine. Partially visualized cervical ACDF and left axillary vascular stent. No acute osseous pathology. IMPRESSION: 1. Cardiomegaly with vascular congestion. 2. Focal area of subpleural nodularity along the right mid lung field likely corresponds to the previously seen cavitary lesion. 3. Small right pleural effusion. Electronically Signed   By: Anner Crete M.D.   On: 03/15/2021 00:05   CT HEAD WO CONTRAST (5MM)  Result Date: 03/15/2021 CLINICAL DATA:  65 year old male with ocular pain and swelling over the left orbit. Facial trauma. EXAM: CT HEAD AND ORBITS WITHOUT CONTRAST TECHNIQUE: Contiguous axial images were obtained from the base of the skull through the vertex without contrast. Multidetector CT imaging of the orbits was performed using the standard protocol without intravenous contrast. COMPARISON:  None FINDINGS: CT HEAD FINDINGS Brain: There is mild age-related atrophy and chronic microvascular ischemic changes. There is no acute intracranial hemorrhage. No mass effect or midline shift. No extra-axial fluid collection. Vascular: No hyperdense vessel  or unexpected calcification. Skull: Normal. Negative for fracture or focal lesion. Other: None CT ORBITS FINDINGS Evaluation of this exam is  limited in the absence of intravenous contrast. Orbits: The globes and retro-orbital fat are preserved. No acute or traumatic injury. Visualized sinuses: There is mild diffuse mucoperiosteal thickening of paranasal sinuses. No air-fluid level. Mild bilateral mastoid effusions. Soft tissues: Left facial and periorbital soft tissue contusion. No large hematoma. IMPRESSION: 1. No acute intracranial pathology. 2. No acute/traumatic orbital pathology. 3. Left facial and periorbital soft tissue contusion. Electronically Signed   By: Anner Crete M.D.   On: 03/15/2021 00:34   CT Orbits Wo Contrast  Result Date: 03/15/2021 CLINICAL DATA:  65 year old male with ocular pain and swelling over the left orbit. Facial trauma. EXAM: CT HEAD AND ORBITS WITHOUT CONTRAST TECHNIQUE: Contiguous axial images were obtained from the base of the skull through the vertex without contrast. Multidetector CT imaging of the orbits was performed using the standard protocol without intravenous contrast. COMPARISON:  None FINDINGS: CT HEAD FINDINGS Brain: There is mild age-related atrophy and chronic microvascular ischemic changes. There is no acute intracranial hemorrhage. No mass effect or midline shift. No extra-axial fluid collection. Vascular: No hyperdense vessel or unexpected calcification. Skull: Normal. Negative for fracture or focal lesion. Other: None CT ORBITS FINDINGS Evaluation of this exam is limited in the absence of intravenous contrast. Orbits: The globes and retro-orbital fat are preserved. No acute or traumatic injury. Visualized sinuses: There is mild diffuse mucoperiosteal thickening of paranasal sinuses. No air-fluid level. Mild bilateral mastoid effusions. Soft tissues: Left facial and periorbital soft tissue contusion. No large hematoma. IMPRESSION: 1. No acute intracranial pathology. 2. No acute/traumatic orbital pathology. 3. Left facial and periorbital soft tissue contusion. Electronically Signed   By: Anner Crete M.D.   On: 03/15/2021 00:34    ROS: Per HPI Blood pressure (!) 145/71, pulse 68, temperature 98.9 F (37.2 C), temperature source Oral, resp. rate 16, height '5\' 2"'$  (1.575 m), weight 69 kg, SpO2 94 %.  Physical Exam: Patient is awake, A/O X 4, conversant, and in good spirits. They are in NAD and VSS. Speech is fluent and appropriate. MAEW with good strength. Bilateral EHL 4/5. Significant muscle tension in his hamstrings on exam. Negative SLR bilaterally. Sensation to light touch is intact. PERLA, EOMI. CNs grossly intact.     Assessment/Plan: 65 y.o. male with progressively worsening lumbosacral pain that radiates into his bilateral lower extremities as well as paresthesias in his bilateral feet.  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  Imaging is most markable for severe bilateral foraminal stenosis at the L5-S1 level and moderate bilateral foraminal stenosis at the L4-5 level.  There is significant amount of facet arthropathy at the L5-S1 level bilaterally, left greater than right.  On examination the patient had significant tenderness to palpation along his bilateral lower extremities.  He also has bilateral EHL weakness 4/5.  Otherwise he has full strength on confrontational testing and a negative lying straight leg raise bilaterally.  Since the patient has had such a significant increase in his pain, I have recommended the patient undergo new MRI imaging.  Once his MRI is complete we will make additional recommendations for the patient.  Continue aggressive pain control and medical management.  Marvis Moeller, DNP, NP-C 03/15/2021, 5:14 PM

## 2021-03-15 NOTE — Progress Notes (Signed)
Patient accepted to [facility] tele bed. Will need dialysis in the AM. [facility] team to assume care when patient arrives to accepting facility.

## 2021-03-16 ENCOUNTER — Inpatient Hospital Stay (HOSPITAL_COMMUNITY): Payer: Medicare HMO

## 2021-03-16 DIAGNOSIS — M545 Low back pain, unspecified: Secondary | ICD-10-CM | POA: Diagnosis not present

## 2021-03-16 LAB — RENAL FUNCTION PANEL
Albumin: 2.5 g/dL — ABNORMAL LOW (ref 3.5–5.0)
Anion gap: 18 — ABNORMAL HIGH (ref 5–15)
BUN: 78 mg/dL — ABNORMAL HIGH (ref 8–23)
CO2: 27 mmol/L (ref 22–32)
Calcium: 7.4 mg/dL — ABNORMAL LOW (ref 8.9–10.3)
Chloride: 91 mmol/L — ABNORMAL LOW (ref 98–111)
Creatinine, Ser: 10.32 mg/dL — ABNORMAL HIGH (ref 0.61–1.24)
GFR, Estimated: 5 mL/min — ABNORMAL LOW (ref >60)
Glucose, Bld: 181 mg/dL — ABNORMAL HIGH (ref 70–99)
Phosphorus: 9 mg/dL — ABNORMAL HIGH (ref 2.5–4.6)
Potassium: 4.1 mmol/L (ref 3.5–5.1)
Sodium: 136 mmol/L (ref 135–145)

## 2021-03-16 LAB — CBC
HCT: 29.9 % — ABNORMAL LOW (ref 39.0–52.0)
HCT: 30.8 % — ABNORMAL LOW (ref 39.0–52.0)
Hemoglobin: 9.4 g/dL — ABNORMAL LOW (ref 13.0–17.0)
Hemoglobin: 9.5 g/dL — ABNORMAL LOW (ref 13.0–17.0)
MCH: 30.5 pg (ref 26.0–34.0)
MCH: 30.3 pg (ref 26.0–34.0)
MCHC: 31.4 g/dL (ref 30.0–36.0)
MCHC: 30.8 g/dL (ref 30.0–36.0)
MCV: 97.1 fL (ref 80.0–100.0)
MCV: 98.1 fL (ref 80.0–100.0)
Platelets: 193 10*3/uL (ref 150–400)
Platelets: 177 10*3/uL (ref 150–400)
RBC: 3.08 MIL/uL — ABNORMAL LOW (ref 4.22–5.81)
RBC: 3.14 MIL/uL — ABNORMAL LOW (ref 4.22–5.81)
RDW: 18.6 % — ABNORMAL HIGH (ref 11.5–15.5)
RDW: 18.5 % — ABNORMAL HIGH (ref 11.5–15.5)
WBC: 12.3 10*3/uL — ABNORMAL HIGH (ref 4.0–10.5)
WBC: 11.8 10*3/uL — ABNORMAL HIGH (ref 4.0–10.5)
nRBC: 0.2 % (ref 0.0–0.2)
nRBC: 0.2 % (ref 0.0–0.2)

## 2021-03-16 LAB — TSH: TSH: 6.154 u[IU]/mL — ABNORMAL HIGH (ref 0.350–4.500)

## 2021-03-16 LAB — T4, FREE: Free T4: 1.01 ng/dL (ref 0.61–1.12)

## 2021-03-16 IMAGING — MR MR LUMBAR SPINE W/O CM
4 of 5 series · 26 of 48 positions shown · non-contrast
Comparison: MRI of the lumbar spine [DATE].

CLINICAL DATA: Low back pain, > 6 wks; Low back pain, infection
suspected.

EXAM:
MRI LUMBAR SPINE WITHOUT CONTRAST
TECHNIQUE: Multiplanar, multisequence MR imaging of the lumbar spine was
performed. No intravenous contrast was administered.

[Series 5: T2 · sagittal · 4.0mm · 0.73mm/px · 6 of 15 slices shown (1 of 2)]
[im 1/15]
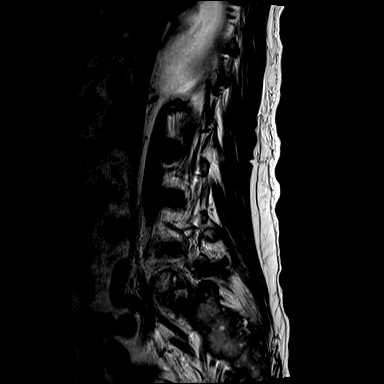
[im 3/15]
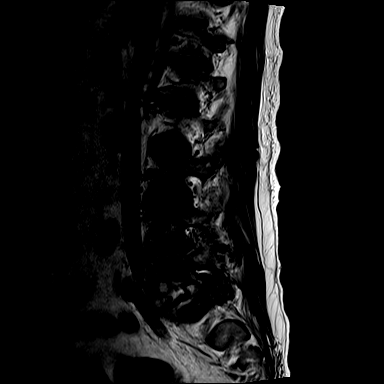
[im 6/15]
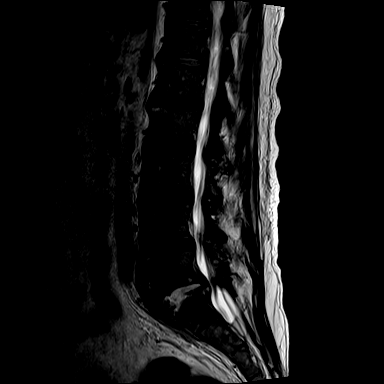
[im 9/15]
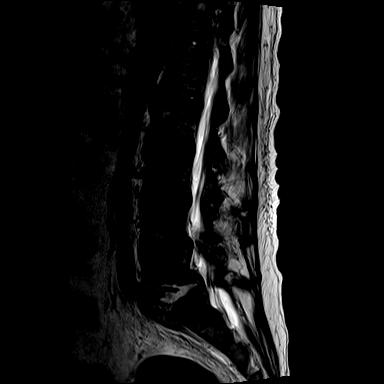
[im 12/15]
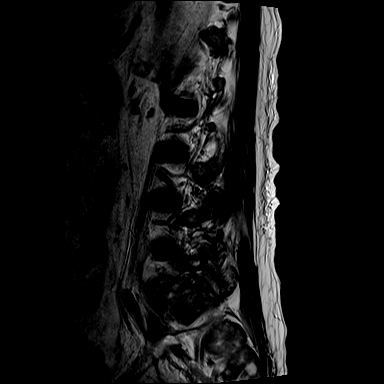
[im 15/15]
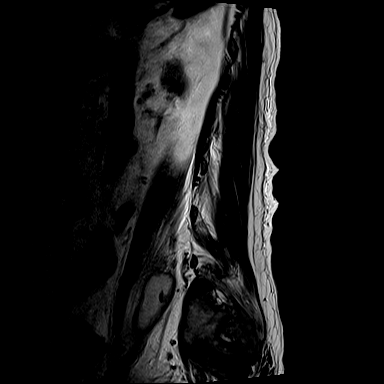

[Series 7: T1 · sagittal · 4.0mm · 0.88mm/px · 7 of 15 slices shown (1 of 2)]
[im 1/15]
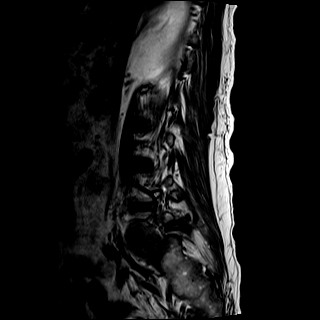
[im 3/15]
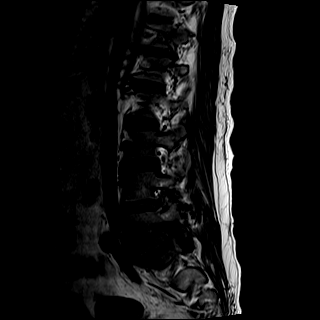
[im 5/15]
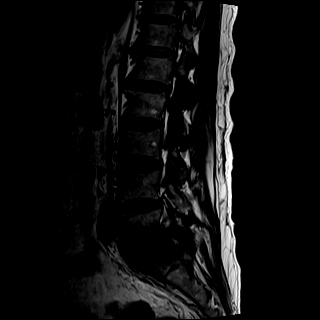
[im 8/15]
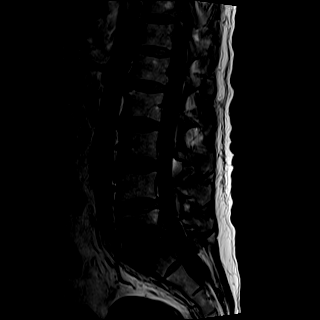
[im 10/15]
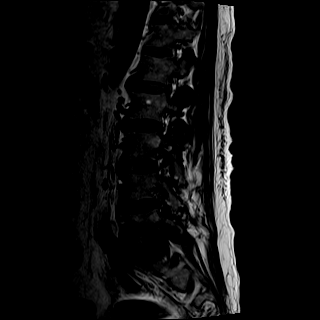
[im 12/15]
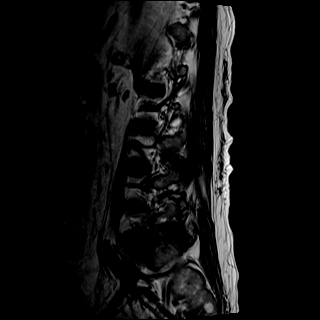
[im 15/15]
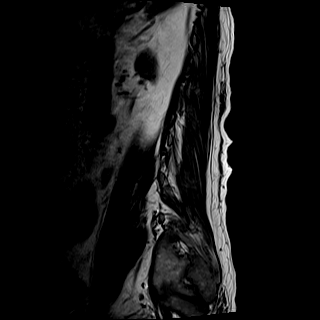

[Series 8: T2 · axial · 4.0mm · 0.57mm/px · z∈[-154,+37]mm · 8 of 31 slices shown (2 of 2)]
[im 1/31]
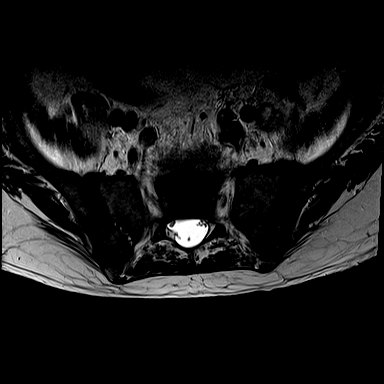
[im 5/31]
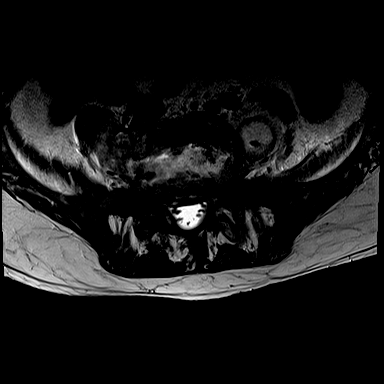
[im 10/31]
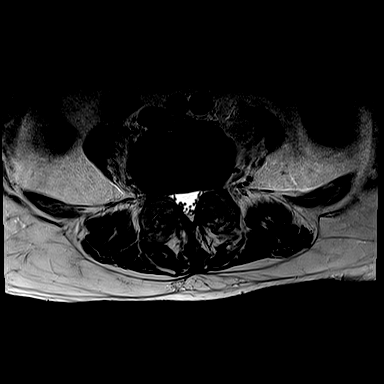
[im 14/31]
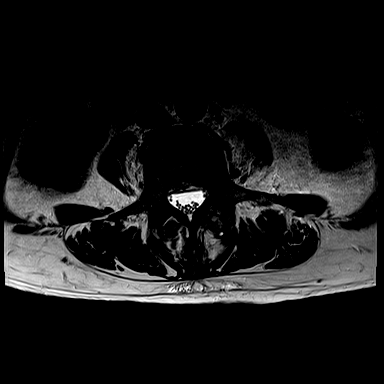
[im 17/31]
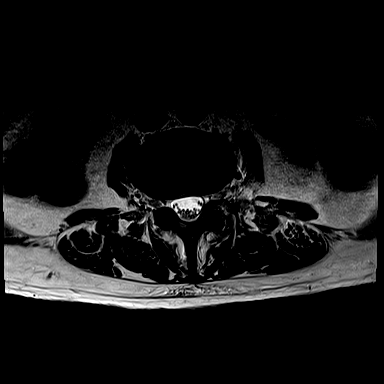
[im 21/31]
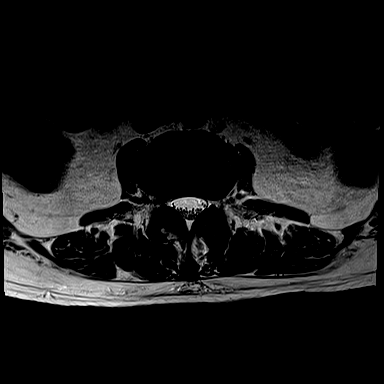
[im 26/31]
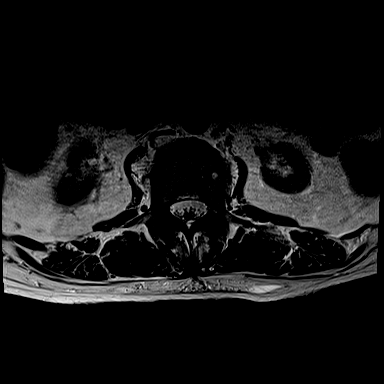
[im 31/31]
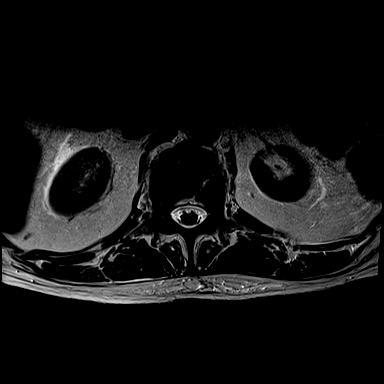

[Series 9: T1 · axial · 4.0mm · 0.34mm/px · z∈[-154,+12]mm · 5 of 31 slices shown (2 of 2)]
[im 1/31]
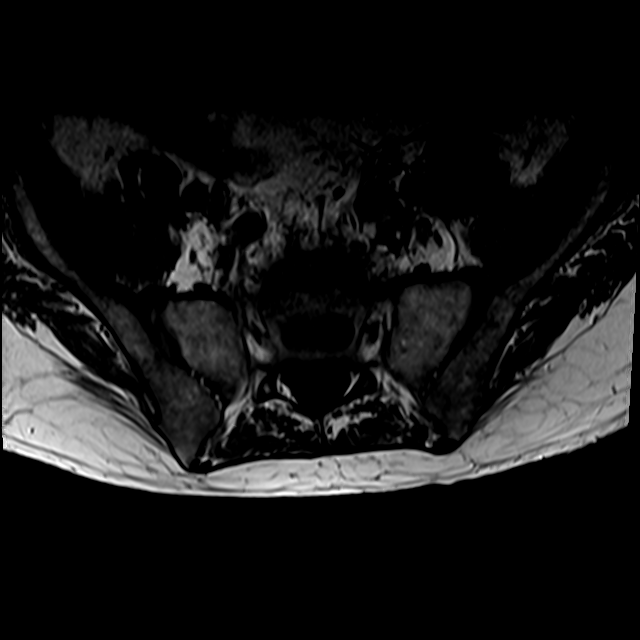
[im 5/31]
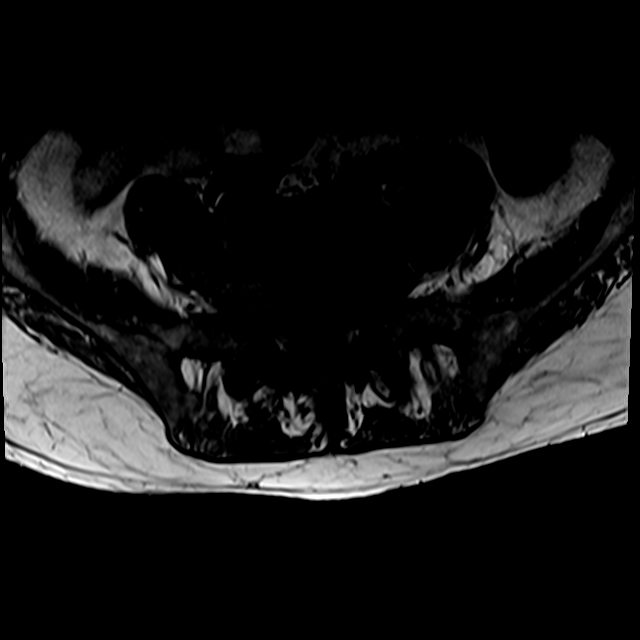
[im 10/31]
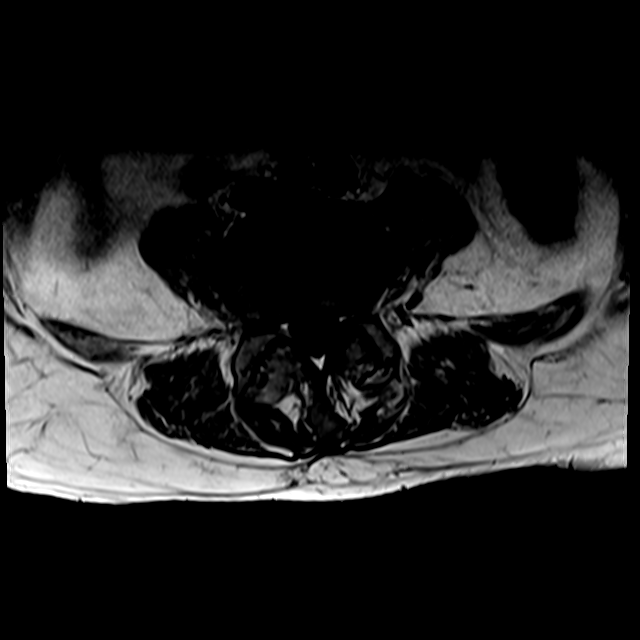
[im 17/31]
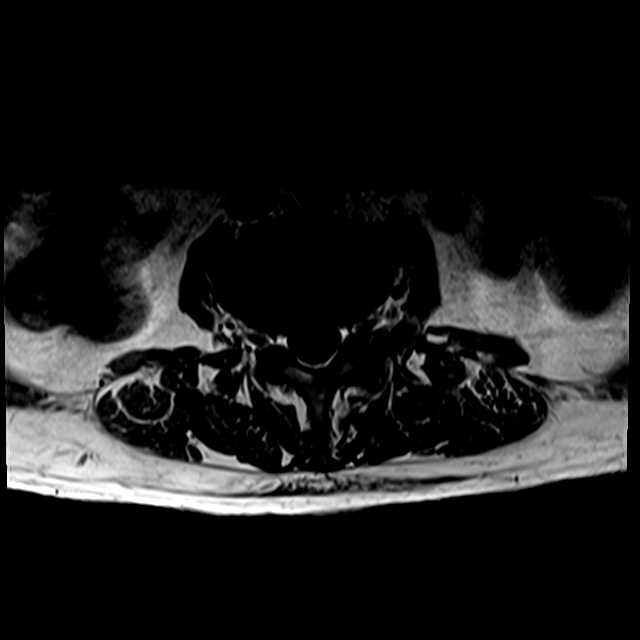
[im 26/31]
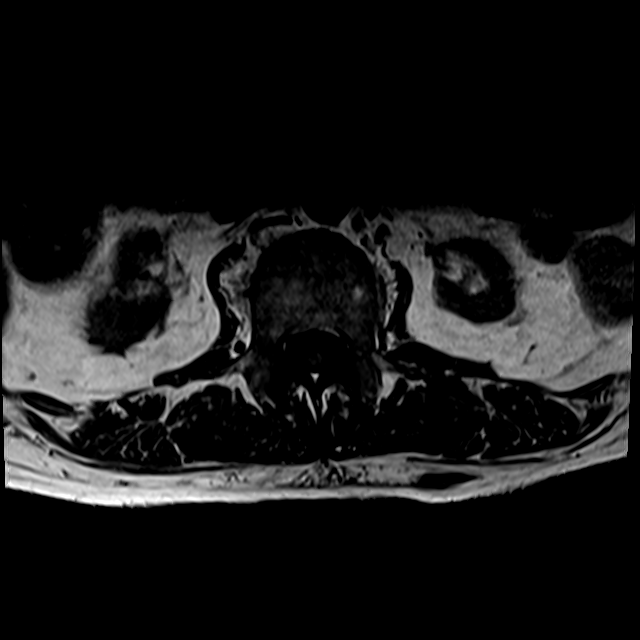

[26 of 48 positions shown; findings below may reference images not displayed]

FINDINGS: Segmentation:  Standard.

Alignment: Trace anterolisthesis of L4 over L5. Straightening of the
lumbar lordosis

Vertebrae: Interval development of prominent endplate erosion and
edema at L5-S1 with fluid signal within the corresponding
intervertebral disc, consistent with discitis/osteomyelitis. There
is perivertebral soft tissue edema with a fluid collection within
the left psoas muscle measuring approximately 4.7 x 2.5 x 2.1 cm,
consistent with abscess. No definitive evidence of epidural fluid
collection. Persistent marrow edema at the left L5-S1 facet joint,
may also be related to septic arthritis.

Conus medullaris and cauda equina: Conus extends to the L2 level.
Conus and cauda equina appear normal.

Paraspinal and other soft tissues: Left psoas abscess, as described
above.

Disc levels:

T12-L1: No spinal canal or neural foraminal stenosis.

L1-2: Shallow disc bulge and mild facet degenerative changes without
significant spinal canal or neural foraminal stenosis.

L2-3: Mild facet degenerative changes without significant spinal
canal or neural foraminal stenosis.

L3-4: Shallow disc bulge, mild right and moderate left facet
degenerative changes resulting in mild-to-moderate left neural
foraminal narrowing.

L4-5: Disc bulge and advanced hypertrophic facet degenerative
changes resulting in mild spinal canal stenosis and moderate
bilateral neural foraminal narrowing, unchanged.

L5-S1: Disc bulge, moderate right and advanced left facet
degenerative changes resulting in narrowing of the bilateral
subarticular zones and severe bilateral neural foraminal narrowing.
IMPRESSION: 1. Findings consistent with L5-S1 discitis/osteomyelitis with
associated left psoas abscess measuring up to 4.7 cm. No epidural
collection identified.
2. Persistent marrow and periarticular soft tissue edema at the left
L5-S1 facet joint, may be related to septic arthritis.
3. Moderate bilateral neural foraminal narrowing at L4-5 and severe
bilateral neural foraminal narrowing at L5-S1.
4. No high-grade spinal canal stenosis.

## 2021-03-16 MED ORDER — PENTAFLUOROPROP-TETRAFLUOROETH EX AERO: 1.0000 "application " | INHALATION_SPRAY | CUTANEOUS | Status: DC | PRN

## 2021-03-16 MED ORDER — SODIUM CHLORIDE 0.9 % IV SOLN: 100.0000 mL | INTRAVENOUS | Status: DC | PRN

## 2021-03-16 MED ORDER — LIDOCAINE-PRILOCAINE 2.5-2.5 % EX CREA: 1.0000 "application " | TOPICAL_CREAM | CUTANEOUS | Status: DC | PRN

## 2021-03-16 MED ORDER — DARBEPOETIN ALFA 100 MCG/0.5ML IJ SOSY
PREFILLED_SYRINGE | INTRAMUSCULAR | Status: AC
  Administered 2021-03-16: 100 ug via INTRAVENOUS
  Filled 2021-03-16: qty 0.5

## 2021-03-16 MED ORDER — DOXERCALCIFEROL 4 MCG/2ML IV SOLN
INTRAVENOUS | Status: AC
  Administered 2021-03-16: 3 ug via INTRAVENOUS
  Filled 2021-03-16: qty 2

## 2021-03-16 MED ORDER — HEPARIN SODIUM (PORCINE) 1000 UNIT/ML DIALYSIS: 2200.0000 [IU] | Freq: Once | INTRAMUSCULAR | Status: DC

## 2021-03-16 MED ORDER — LIDOCAINE HCL (PF) 1 % IJ SOLN: 5.0000 mL | INTRAMUSCULAR | Status: DC | PRN

## 2021-03-16 NOTE — Procedures (Signed)
I was present at this dialysis session. I have reviewed the session itself and made appropriate changes.   Vital signs in last 24 hours:  Temp:  [97.9 F (36.6 C)-98.9 F (37.2 C)] 98 F (36.7 C) (08/05 0622) Pulse Rate:  [61-68] 61 (08/05 0622) Resp:  [14-17] 17 (08/05 0622) BP: (120-148)/(65-71) 135/70 (08/05 0622) SpO2:  [94 %-100 %] 100 % (08/05 0622) Weight change:  Filed Weights   03/14/21 2254  Weight: 69 kg    Recent Labs  Lab 03/16/21 0049  NA 136  K 4.1  CL 91*  CO2 27  GLUCOSE 181*  BUN 78*  CREATININE 10.32*  CALCIUM 7.4*  PHOS 9.0*    Recent Labs  Lab 03/14/21 2259 03/16/21 0049  WBC 15.1* 12.3*  NEUTROABS 12.8*  --   HGB 10.0* 9.4*  HCT 32.0* 29.9*  MCV 95.8 97.1  PLT 212 193    Scheduled Meds:  Chlorhexidine Gluconate Cloth  6 each Topical Q0600   darbepoetin (ARANESP) injection - DIALYSIS  100 mcg Intravenous Q Fri-HD   doxercalciferol  3 mcg Intravenous Q M,W,F-HD   feeding supplement (NEPRO CARB STEADY)  237 mL Oral BID BM   gabapentin  100 mg Oral QHS   heparin  2,200 Units Dialysis Once in dialysis   heparin  5,000 Units Subcutaneous Q8H   midodrine  10 mg Oral TID WC   multivitamin  1 tablet Oral QHS   sodium chloride flush  3 mL Intravenous Q12H   sucroferric oxyhydroxide  1,000 mg Oral TID WC   Continuous Infusions:  sodium chloride     sodium chloride     cefTRIAXone (ROCEPHIN)  IV 1 g (03/16/21 0210)   iron sucrose     PRN Meds:.sodium chloride, sodium chloride, acetaminophen **OR** acetaminophen, albuterol, lidocaine (PF), lidocaine-prilocaine, oxyCODONE-acetaminophen, pentafluoroprop-tetrafluoroeth    Dialysis Orders:[facility] MWF  3h 47mn  180NRe 400/600  64kg   2/2 bath  P4  AVF   -Heparin  2200 units IV TIW -Mircera 150 mcg IV q 2 week (last dose 150 mcg IV 02/28/2021) -Venofer 100 mg IV X 10 doses (4/6 doses given.  -Hectorol 3 mcg IV TIW     Assessment/Plan: Pain in Back/BLE-rates as severe and interfering with QOL.  Per primary.  Concern that this pain is related to his recent bacteremia-  possible discitis or osteomyelitis-  for MRI of LS spine later today. Pulmonary edema: Vascular congestion, small R pleural effusion on CXR but appears mild in comparison to CXR 01/2021. He has not been eating well, has most likely lost body wt. No acute dialysis needs today. No evidence of overt volume overload.  Will attempt to lower volume as tolerated in HD tomorrow. Elevated troponin: In setting of vascular congestion and ESRD. Troponin 84, 101 so far. Denies chest pain. Per primary Preseptal cellulitis with bacterial conjunctivitis-per primary  ESRD -  Missed HD 03/14/21. Next HD 03/16/2021. 2.0 K bath-K+ 4.4. Low dose heparin. SCr 8.5 BUN 63. No evidence of uremia.   Hypertension/volume  - As noted above. BP well controlled. On midodrine 10 mg PO TID. No evidence of overt volume overload by exam.  Lower EDW as tolerated.  Anemia  - HGB 9.4.  Missed OP ESA 08/03. Give today with HD. Continue Fe load.   Metabolic bone disease - Continue velphoro 500 mg 2 tabs PO TID AC. Continue VDRA.   Nutrition - Renal carb mod diet, nepro, renal vits.  DM-No meds on OP list. Last HA1c 6.9.  per primary  Donetta Potts,  MD 03/16/2021, 8:34 AM

## 2021-03-16 NOTE — Progress Notes (Signed)
PT Cancellation Note  Patient Details Name: TYRIAN INFANTE MRN: [id number] DOB: 01-25-1956   Cancelled Treatment:    Reason Eval/Treat Not Completed: Patient at procedure or test/unavailable (Pt still in HD. Will check back as able.)   Alvira Philips 03/16/2021, 12:27 PM Marieta Markov M,PT Acute Rehab Services [phone] (pager)

## 2021-03-16 NOTE — Progress Notes (Signed)
PROGRESS NOTE    Ryan Wells  XHB:[id number] DOB: [birth year] DOA: 03/14/2021 PCP: Pcp, No    Brief Narrative:  Ryan Wells is a 65 y.o. male with medical history significant of ESRD on HD, hypertension, diabetes mellitus type 2, anemia of chronic disease, and MSSA bacteremia presented with complaints of back and leg pain. Symptoms have been acutely worse at least for the last 2 weeks.  He complains of having severe 10/10 sharp pains in his lower back that radiate down either leg.  He complains of his legs feeling cold and being unable to stand or move around due to symptoms.  Denies any recent trauma or injury to onset symptoms.  Due to the symptoms he had missed hemodialysis yesterday, and last dialyzed on 8/1.  The other day he reported having uncontrollable sweating which was unusual for him.   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  It appears during this hospitalization he had MRI imaging which noted bilateral L5-S1 facet osteoarthritis left greater than right with synovial cysts and periarticular soft tissue edema on the left which superimposed septic arthritis could not be excluded and anterior displacement of the cord with distortion at T6-T7.  He had completed 6 weeks of IV antibiotics with dialysis per ID notes from 8/2.   Patient was last in the emergency department on 7/25 complaining of radicular low back pain.  At that time neurosurgery had been consulted and recommended a short course of steroids and to follow-up in the office.  Patient does not appear to have done this.  He denies having shortness of breath, fever, chest pain, nausea, vomiting, or recent trauma.  It appears he was referred for physical therapy, but had not been able to do it due to pain.   In the emergency department patient was seen to be afebrile with stable vital signs.  CT imaging of the head and  orbits negative for any acute intracranial pathology, but noted left facial and periorbital soft tissue contusion.  Labs from 8/3 significant for WBC 15.1, hemoglobin 10, potassium 4.4, BUN 63, creatinine 8.5, anion gap 20, and high-sensitivity troponin 84->101.  Chest x-ray significant for cardiomegaly with vascular congestion, focal area with pulmonary nodularity of the right mid lung correlates with cavitating lesion and a small right-sided pleural effusion.  Influenza and COVID-19 screening both were negative.  He had been given full dose aspirin, nitroglycerin, ketorolac, Rocephin IV, and ciprofloxacin ophthalmic drops for concern for preseptal cellulitis and acute bacterial conjunctivitis.   Assessment & Plan:   Principal Problem:   Lumbar back pain Active Problems:   Subclinical hypothyroidism   Anemia due to chronic kidney disease   Diabetes mellitus type II, non insulin dependent (HCC)   ESRD on hemodialysis (HCC)   Pleural effusion   Elevated troponin   Leg pain   Leukocytosis   History of bacteremia   Lumbar back pain and leg pain Patient presents with acute worsening lumbar back pain with radiation down both legs to the point where he is unable to walk.  Patient with significant tenderness palpation of the lumbar spine or legs.  He makes it seem as though pain symptoms may be radiating down his legs. MRI of the lumbar spine from 01/17/2021 significant for severe bilateral foraminal stenosis at L5-S1, moderate bilateral stenosis at L4-L5, bilateral facet osteoarthritis worse on the left, and noted to have posteriorly projecting synovial cyst with periarticular soft tissue  edema on the left for which there was concern for the possibility of superimposed septic arthritis. Concern for septic arthritis vs discitis vs osteomyelitis of the lumbar spine and recent MSSA septicemia.  Completed 6-week course of antibiotics per infectious disease, on 8/2.  WBC elevated 15.1, ESR 54 and CRP  22.1. --Neurosurgery following, appreciate assistance --Blood cultures x2 8/4: Pending --MRI L-spine without contrast: Pending --Oxycodone as needed for pain --Continue empiric antibiotics of Rocephin IV --PT/OT evaluation: Pending   Leukocytosis:  WBC elevated 15.1 and otherwise noted to be afebrile.  Question of possibility of underlying infection. --WBC 15.1>12.3 -- Follow CBC daily   Left face contusion: Acute.   Patient was noted to have left facial and periorbital soft tissue contusion on CT imaging.  It appears he had initially been given ciprofloxacin and Rocephin IV for concern for periorbital cellulitis with concern for bacterial conjunctivitis.  However, on physical exam patient without significant erythema pain at this time. --Continue supportive care for now   Elevated troponin: Acute.   Patient denies any complaints of chest pain.  High-sensitivity troponin 84 -101.  EKG without significant ischemic changes.  Suspect secondary to demand. -Continue to monitor on telemetry   Right-sided pleural effusion:  Patient noted to have cardiomegaly with vascular congestion and small left-sided pleural effusion on imaging.  Patient did not appear to be grossly fluid overloaded on physical exam.  Oxygenating well on room air.  Continue volume management with HD.   ESRD on HD:  Patient had missed hemodialysis yesterday due to having significant pain, but labs appear to be relatively stable. --Continue hemodialysis per nephrology   Diabetes mellitus type 2:  Patient well controlled not on any medications for treatment.  Last hemoglobin A1c was 5.1 on 01/16/2021. -Continue renal and carb modified diet   History of MSSA bacteremia:  Patient had been treated and completed his 6 weeks of antibiotics from his previous hospitalization in June.  It appears he had no signs of endocarditis on TEE.   Anemia chronic kidney disease:  Hemoglobin 10 g/dL which appears improved from previous, but  baseline appears to be 8 g/dL. -Continue to monitor   Subclinical hypothyroidism:  TSH 6.154, free T4 normal at 1.01.   DVT prophylaxis: heparin injection 5,000 Units Start: 03/15/21 1400   Code Status: Full Code Family Communication: No family present at bedside this morning.  Disposition Plan:  Level of care: Telemetry Medical Status is: Inpatient  Remains inpatient appropriate because:Ongoing diagnostic testing needed not appropriate for outpatient work up, Unsafe d/c plan, IV treatments appropriate due to intensity of illness or inability to take PO, and Inpatient level of care appropriate due to severity of illness  Dispo: The patient is from: Home              Anticipated d/c is to: Home              Patient currently is not medically stable to d/c.   Difficult to place patient No   Consultants:  Neurosurgery Nephrology  Procedures:  None  Antimicrobials:  Ceftriaxone 8/4>>   Subjective: Patient seen examined at bedside, resting comfortably.  Currently undergoing hemodialysis.  Continues with low back pain.  Still awaiting MRI L-spine.  No other complaints or concerns at this time.  Denies headache, no visual changes, no chest pain, palpitations, no shortness of breath, no abdominal pain, no weakness, no fatigue, no paresthesias.  No acute events overnight per nurse staff.  Objective: Vitals:   03/16/21 1030  03/16/21 1100 03/16/21 1130 03/16/21 1210  BP: 131/73 111/68 118/66 132/66  Pulse: 67 67 (!) 54 73  Resp: (!) $RemoveB'9 11 13 10  'xTWABSfN$ Temp:    97.7 F (36.5 C)  TempSrc:      SpO2:    99%  Weight:      Height:        Intake/Output Summary (Last 24 hours) at 03/16/2021 1322 Last data filed at 03/16/2021 1210 Gross per 24 hour  Intake --  Output 3973 ml  Net -3973 ml   Filed Weights   03/14/21 2254 03/16/21 0815  Weight: 69 kg 63.2 kg    Examination:  General exam: Appears calm and comfortable, poor dentition Respiratory system: Clear to auscultation.  Respiratory effort normal. Cardiovascular system: S1 & S2 heard, RRR. No JVD, murmurs, rubs, gallops or clicks. No pedal edema. Gastrointestinal system: Abdomen is nondistended, soft and nontender. No organomegaly or masses felt. Normal bowel sounds heard. Central nervous system: Alert and oriented. No focal neurological deficits. Extremities: Symmetric 5 x 5 power.  Neurovascular intact, sensation to light touch intact. Skin: No rashes, lesions or ulcers Psychiatry: Judgement and insight appear normal. Mood & affect appropriate.     Data Reviewed: I have personally reviewed following labs and imaging studies  CBC: Recent Labs  Lab 03/14/21 2259 03/16/21 0049 03/16/21 0832  WBC 15.1* 12.3* 11.8*  NEUTROABS 12.8*  --   --   HGB 10.0* 9.4* 9.5*  HCT 32.0* 29.9* 30.8*  MCV 95.8 97.1 98.1  PLT 212 193 409   Basic Metabolic Panel: Recent Labs  Lab 03/14/21 2259 03/15/21 1150 03/16/21 0049  NA 139 142 136  K 4.4 4.0 4.1  CL 94* 95* 91*  CO2 $Re'25 27 27  'IgT$ GLUCOSE 173* 95 181*  BUN 63* 70* 78*  CREATININE 8.50* 9.60* 10.32*  CALCIUM 7.9* 7.6* 7.4*  PHOS  --  9.0* 9.0*   GFR: Estimated Creatinine Clearance: 5.4 mL/min (A) (by C-G formula based on SCr of 10.32 mg/dL (H)). Liver Function Tests: Recent Labs  Lab 03/14/21 2259 03/15/21 1150 03/16/21 0049  AST 21  --   --   ALT 9  --   --   ALKPHOS 107  --   --   BILITOT 1.2  --   --   PROT 7.1  --   --   ALBUMIN 3.4* 2.4* 2.5*   No results for input(s): LIPASE, AMYLASE in the last 168 hours. No results for input(s): AMMONIA in the last 168 hours. Coagulation Profile: No results for input(s): INR, PROTIME in the last 168 hours. Cardiac Enzymes: No results for input(s): CKTOTAL, CKMB, CKMBINDEX, TROPONINI in the last 168 hours. BNP (last 3 results) No results for input(s): PROBNP in the last 8760 hours. HbA1C: No results for input(s): HGBA1C in the last 72 hours. CBG: No results for input(s): GLUCAP in the last 168  hours. Lipid Profile: No results for input(s): CHOL, HDL, LDLCALC, TRIG, CHOLHDL, LDLDIRECT in the last 72 hours. Thyroid Function Tests: Recent Labs    03/16/21 0049  TSH 6.154*  FREET4 1.01   Anemia Panel: No results for input(s): VITAMINB12, FOLATE, FERRITIN, TIBC, IRON, RETICCTPCT in the last 72 hours. Sepsis Labs: No results for input(s): PROCALCITON, LATICACIDVEN in the last 168 hours.  Recent Results (from the past 240 hour(s))  Resp Panel by RT-PCR (Flu A&B, Covid) Nasopharyngeal Swab     Status: None   Collection Time: 03/15/21  2:08 AM   Specimen: Nasopharyngeal Swab; Nasopharyngeal(NP) swabs  in vial transport medium  Result Value Ref Range Status   SARS Coronavirus 2 by RT PCR NEGATIVE NEGATIVE Final    Comment: (NOTE) SARS-CoV-2 target nucleic acids are NOT DETECTED.  The SARS-CoV-2 RNA is generally detectable in upper respiratory specimens during the acute phase of infection. The lowest concentration of SARS-CoV-2 viral copies this assay can detect is 138 copies/mL. A negative result does not preclude SARS-Cov-2 infection and should not be used as the sole basis for treatment or other patient management decisions. A negative result may occur with  improper specimen collection/handling, submission of specimen other than nasopharyngeal swab, presence of viral mutation(s) within the areas targeted by this assay, and inadequate number of viral copies(<138 copies/mL). A negative result must be combined with clinical observations, patient history, and epidemiological information. The expected result is Negative.  Fact Sheet for Patients:  EntrepreneurPulse.com.au  Fact Sheet for Healthcare Providers:  IncredibleEmployment.be  This test is no t yet approved or cleared by the [place] FDA and  has been authorized for detection and/or diagnosis of SARS-CoV-2 by FDA under an Emergency Use Authorization (EUA). This EUA will remain   in effect (meaning this test can be used) for the duration of the COVID-19 declaration under Section 564(b)(1) of the Act, 21 U.S.C.section 360bbb-3(b)(1), unless the authorization is terminated  or revoked sooner.       Influenza A by PCR NEGATIVE NEGATIVE Final   Influenza B by PCR NEGATIVE NEGATIVE Final    Comment: (NOTE) The Xpert Xpress SARS-CoV-2/FLU/RSV plus assay is intended as an aid in the diagnosis of influenza from Nasopharyngeal swab specimens and should not be used as a sole basis for treatment. Nasal washings and aspirates are unacceptable for Xpert Xpress SARS-CoV-2/FLU/RSV testing.  Fact Sheet for Patients: EntrepreneurPulse.com.au  Fact Sheet for Healthcare Providers: IncredibleEmployment.be  This test is not yet approved or cleared by the [place] FDA and has been authorized for detection and/or diagnosis of SARS-CoV-2 by FDA under an Emergency Use Authorization (EUA). This EUA will remain in effect (meaning this test can be used) for the duration of the COVID-19 declaration under Section 564(b)(1) of the Act, 21 U.S.C. section 360bbb-3(b)(1), unless the authorization is terminated or revoked.  Performed at [facility], [address] [place], [place] [address]   Culture, blood (routine x 2)     Status: None (Preliminary result)   Collection Time: 03/15/21  3:39 PM   Specimen: BLOOD  Result Value Ref Range Status   Specimen Description BLOOD RIGHT ANTECUBITAL  Final   Special Requests   Final    BOTTLES DRAWN AEROBIC AND ANAEROBIC Blood Culture results may not be optimal due to an inadequate volume of blood received in culture bottles   Culture   Final    NO GROWTH < 24 HOURS Performed at [facility], [place] [address] [place], [place] [address]    Report Status PENDING  Incomplete  Culture, blood (routine x 2)     Status: None (Preliminary result)   Collection Time: 03/15/21  3:47  PM   Specimen: BLOOD RIGHT HAND  Result Value Ref Range Status   Specimen Description BLOOD RIGHT HAND  Final   Special Requests   Final    BOTTLES DRAWN AEROBIC AND ANAEROBIC Blood Culture adequate volume   Culture   Final    NO GROWTH < 24 HOURS Performed at [facility], [place] [address] [place], [place] [address]    Report Status PENDING  Incomplete         Radiology Studies: DG Chest 1 View  Result Date: 03/15/2021 CLINICAL DATA:  65 year old male with generalized pain. EXAM: CHEST  1 VIEW COMPARISON:  Chest radiograph dated 01/28/2021. FINDINGS: Cardiomegaly with vascular congestion. Focal area of subpleural nodularity along the lateral right mid lung field likely corresponds to the previously seen cavitary lesion. Overall the previously seen nodules are less conspicuous on today's study. There is a small right pleural effusion or pleural thickening. No pneumothorax. No acute osseous pathology. Degenerative changes of the spine. Partially visualized cervical ACDF and left axillary vascular stent. No acute osseous pathology. IMPRESSION: 1. Cardiomegaly with vascular congestion. 2. Focal area of subpleural nodularity along the right mid lung field likely corresponds to the previously seen cavitary lesion. 3. Small right pleural effusion. Electronically Signed   By: Anner Crete M.D.   On: 03/15/2021 00:05   CT HEAD WO CONTRAST (5MM)  Result Date: 03/15/2021 CLINICAL DATA:  65 year old male with ocular pain and swelling over the left orbit. Facial trauma. EXAM: CT HEAD AND ORBITS WITHOUT CONTRAST TECHNIQUE: Contiguous axial images were obtained from the base of the skull through the vertex without contrast. Multidetector CT imaging of the orbits was performed using the standard protocol without intravenous contrast. COMPARISON:  None FINDINGS: CT HEAD FINDINGS Brain: There is mild age-related atrophy and chronic microvascular ischemic changes. There is no acute intracranial  hemorrhage. No mass effect or midline shift. No extra-axial fluid collection. Vascular: No hyperdense vessel or unexpected calcification. Skull: Normal. Negative for fracture or focal lesion. Other: None CT ORBITS FINDINGS Evaluation of this exam is limited in the absence of intravenous contrast. Orbits: The globes and retro-orbital fat are preserved. No acute or traumatic injury. Visualized sinuses: There is mild diffuse mucoperiosteal thickening of paranasal sinuses. No air-fluid level. Mild bilateral mastoid effusions. Soft tissues: Left facial and periorbital soft tissue contusion. No large hematoma. IMPRESSION: 1. No acute intracranial pathology. 2. No acute/traumatic orbital pathology. 3. Left facial and periorbital soft tissue contusion. Electronically Signed   By: Anner Crete M.D.   On: 03/15/2021 00:34   CT Orbits Wo Contrast  Result Date: 03/15/2021 CLINICAL DATA:  65 year old male with ocular pain and swelling over the left orbit. Facial trauma. EXAM: CT HEAD AND ORBITS WITHOUT CONTRAST TECHNIQUE: Contiguous axial images were obtained from the base of the skull through the vertex without contrast. Multidetector CT imaging of the orbits was performed using the standard protocol without intravenous contrast. COMPARISON:  None FINDINGS: CT HEAD FINDINGS Brain: There is mild age-related atrophy and chronic microvascular ischemic changes. There is no acute intracranial hemorrhage. No mass effect or midline shift. No extra-axial fluid collection. Vascular: No hyperdense vessel or unexpected calcification. Skull: Normal. Negative for fracture or focal lesion. Other: None CT ORBITS FINDINGS Evaluation of this exam is limited in the absence of intravenous contrast. Orbits: The globes and retro-orbital fat are preserved. No acute or traumatic injury. Visualized sinuses: There is mild diffuse mucoperiosteal thickening of paranasal sinuses. No air-fluid level. Mild bilateral mastoid effusions. Soft tissues:  Left facial and periorbital soft tissue contusion. No large hematoma. IMPRESSION: 1. No acute intracranial pathology. 2. No acute/traumatic orbital pathology. 3. Left facial and periorbital soft tissue contusion. Electronically Signed   By: Anner Crete M.D.   On: 03/15/2021 00:34        Scheduled Meds:  Chlorhexidine Gluconate Cloth  6 each Topical Q0600   darbepoetin (ARANESP) injection - DIALYSIS  100 mcg Intravenous Q  Fri-HD   doxercalciferol  3 mcg Intravenous Q M,W,F-HD   feeding supplement (NEPRO CARB STEADY)  237 mL Oral BID BM   gabapentin  100 mg Oral QHS   heparin  2,200 Units Dialysis Once in dialysis   heparin  5,000 Units Subcutaneous Q8H   midodrine  10 mg Oral TID WC   multivitamin  1 tablet Oral QHS   sodium chloride flush  3 mL Intravenous Q12H   sucroferric oxyhydroxide  1,000 mg Oral TID WC   Continuous Infusions:  sodium chloride     sodium chloride     cefTRIAXone (ROCEPHIN)  IV 1 g (03/16/21 0210)   iron sucrose 100 mg (03/16/21 1036)     LOS: 1 day    Time spent: 39 minutes spent on chart review, discussion with nursing staff, consultants, updating family and interview/physical exam; more than 50% of that time was spent in counseling and/or coordination of care.    Cheyenna Pankowski J [place] DO [facility] Available via Epic secure chat 7am-7pm After these hours, please refer to coverage provider listed on [email or web address] 03/16/2021, 1:22 PM

## 2021-03-16 NOTE — Progress Notes (Signed)
PT Cancellation Note  Patient Details Name: Ryan Wells MRN: [id number] DOB: 01/23/1956   Cancelled Treatment:    Reason Eval/Treat Not Completed: Patient at procedure or test/unavailable (Pt just getting transported to HD.  Will check back as able.)   Alvira Philips 03/16/2021, 8:29 AM Deldrick Linch M,PT Acute Rehab Services [phone] (pager)

## 2021-03-16 NOTE — Progress Notes (Signed)
OT Cancellation Note  Patient Details Name: Ryan Wells MRN: [id number] DOB: 01-Jul-1956   Cancelled Treatment:    Reason Eval/Treat Not Completed: Patient at procedure or test/ unavailable; off floor for HD. OT to check back as time allows.   Gloris Manchester OTR/L Supplemental OT, Department of rehab services [phone]  Faith Patricelli R H. 03/16/2021, 8:39 AM

## 2021-03-17 ENCOUNTER — Inpatient Hospital Stay (HOSPITAL_COMMUNITY): Payer: Medicare HMO

## 2021-03-17 DIAGNOSIS — M545 Low back pain, unspecified: Secondary | ICD-10-CM | POA: Diagnosis not present

## 2021-03-17 LAB — CBC
HCT: 34 % — ABNORMAL LOW (ref 39.0–52.0)
Hemoglobin: 10.3 g/dL — ABNORMAL LOW (ref 13.0–17.0)
MCH: 29.7 pg (ref 26.0–34.0)
MCHC: 30.3 g/dL (ref 30.0–36.0)
MCV: 98 fL (ref 80.0–100.0)
Platelets: 193 10*3/uL (ref 150–400)
RBC: 3.47 MIL/uL — ABNORMAL LOW (ref 4.22–5.81)
RDW: 18.4 % — ABNORMAL HIGH (ref 11.5–15.5)
WBC: 12.3 10*3/uL — ABNORMAL HIGH (ref 4.0–10.5)
nRBC: 0.2 % (ref 0.0–0.2)

## 2021-03-17 LAB — RENAL FUNCTION PANEL
Albumin: 2.5 g/dL — ABNORMAL LOW (ref 3.5–5.0)
Anion gap: 13 (ref 5–15)
BUN: 34 mg/dL — ABNORMAL HIGH (ref 8–23)
CO2: 27 mmol/L (ref 22–32)
Calcium: 7.7 mg/dL — ABNORMAL LOW (ref 8.9–10.3)
Chloride: 94 mmol/L — ABNORMAL LOW (ref 98–111)
Creatinine, Ser: 5.94 mg/dL — ABNORMAL HIGH (ref 0.61–1.24)
GFR, Estimated: 10 mL/min — ABNORMAL LOW (ref >60)
Glucose, Bld: 178 mg/dL — ABNORMAL HIGH (ref 70–99)
Phosphorus: 4 mg/dL (ref 2.5–4.6)
Potassium: 3.7 mmol/L (ref 3.5–5.1)
Sodium: 134 mmol/L — ABNORMAL LOW (ref 135–145)

## 2021-03-17 IMAGING — CT CT PELVIS W/ CM
2 of 3 series · 16 of 46 positions shown, 18 images · IV contrast (Omni 300)
Comparison: CT abdomen pelvis [DATE], MRI lumbar spine
[DATE]

CLINICAL DATA: Abscess, anal or rectal psoas abcess

EXAM:
CT PELVIS WITH CONTRAST
TECHNIQUE: Multidetector CT imaging of the pelvis was performed using the
standard protocol following the bolus administration of intravenous
contrast.
CONTRAST:  100mL OMNIPAQUE IOHEXOL 300 MG/ML  SOLN

[Series 3: pelvis with 5.0 · axial · 0.81mm/px · z∈[+113,+383]mm · 13 of 63 slices shown, 15 images]
[im 5/63  soft-tissue]
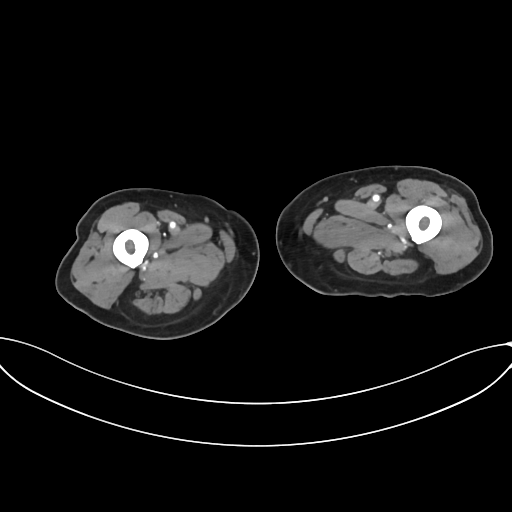
[im 5/63  bone]
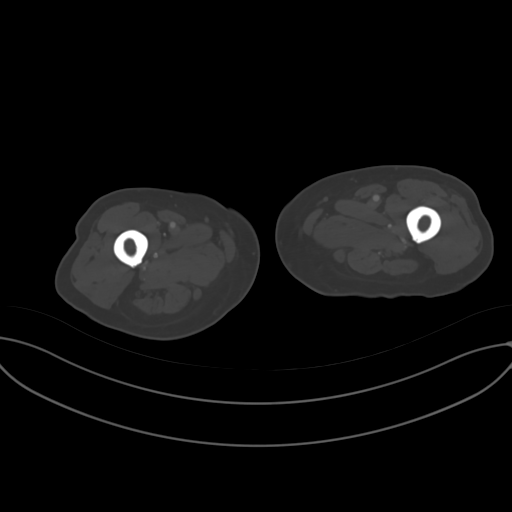
[im 9/63  soft-tissue]
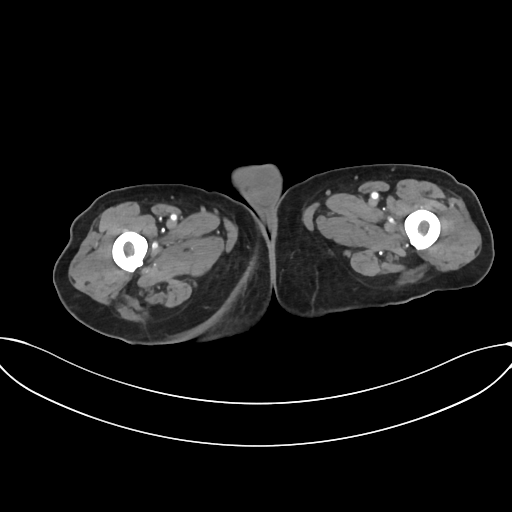
[im 13/63  soft-tissue]
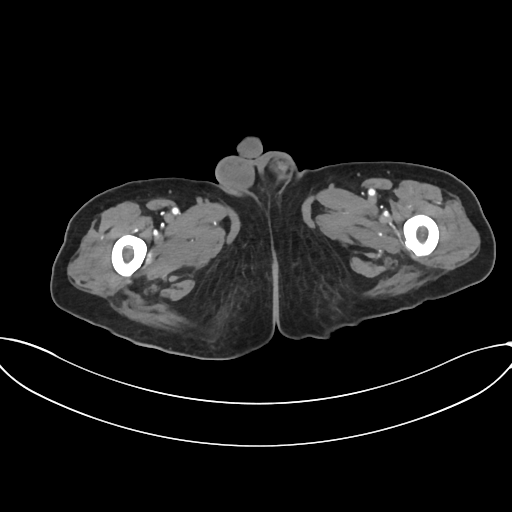
[im 19/63  soft-tissue]
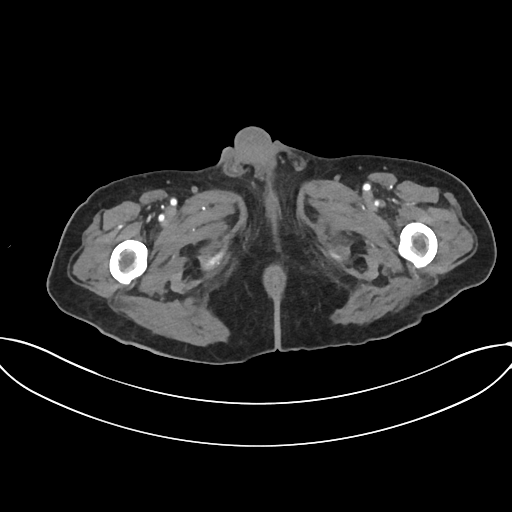
[im 23/63  soft-tissue]
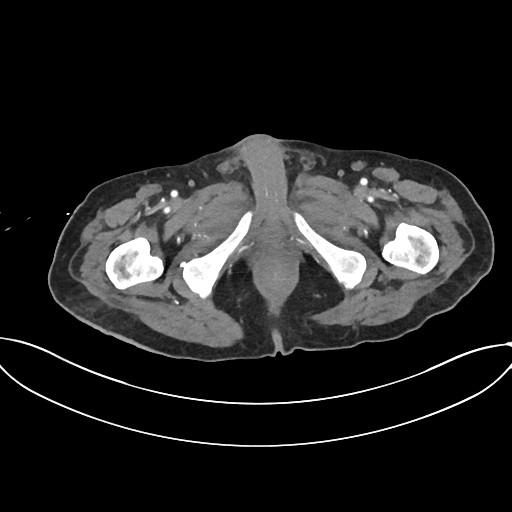
[im 27/63  soft-tissue]
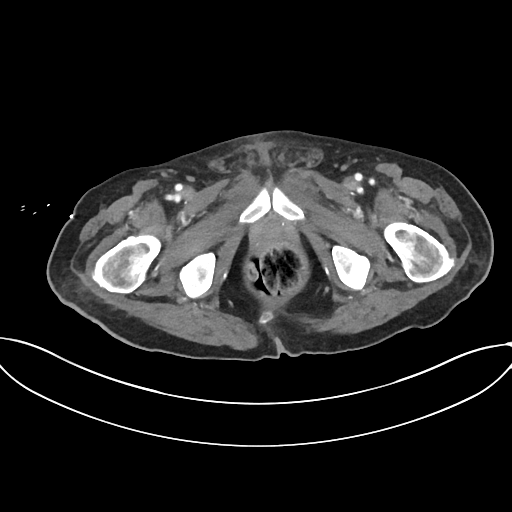
[im 33/63  soft-tissue]
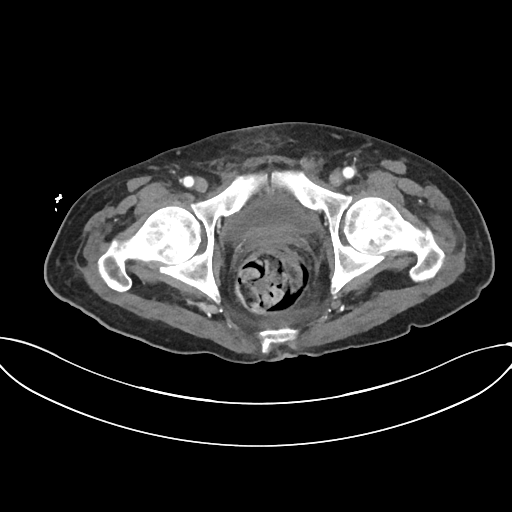
[im 37/63  soft-tissue]
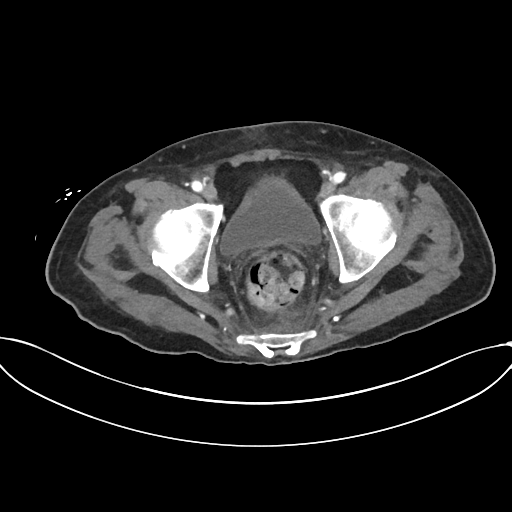
[im 41/63  soft-tissue]
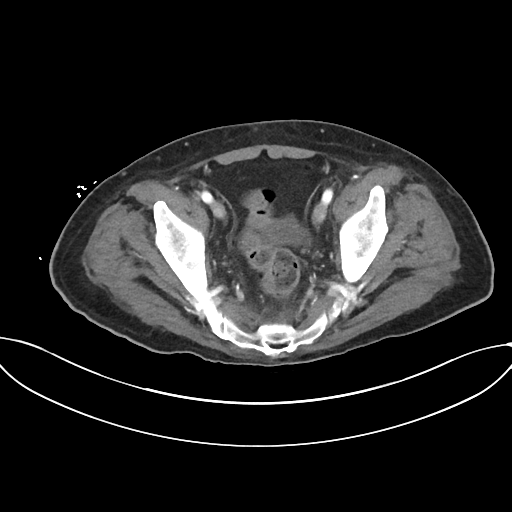
[im 41/63  bone]
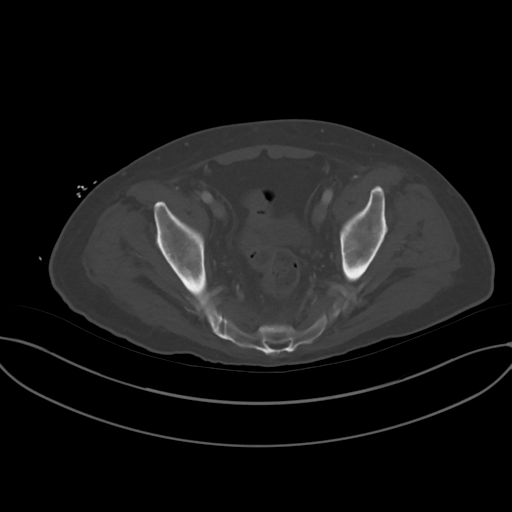
[im 45/63  soft-tissue]
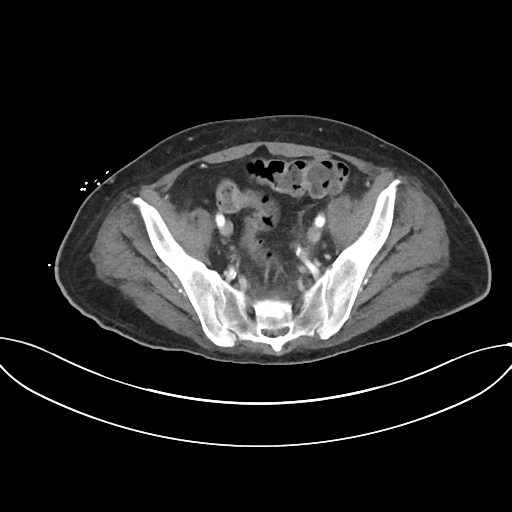
[im 51/63  soft-tissue]
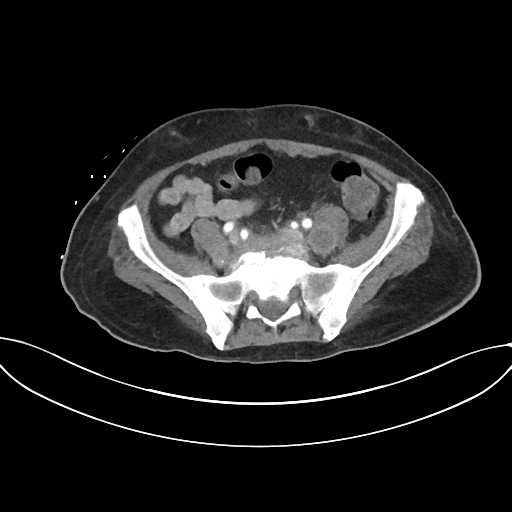
[im 55/63  soft-tissue]
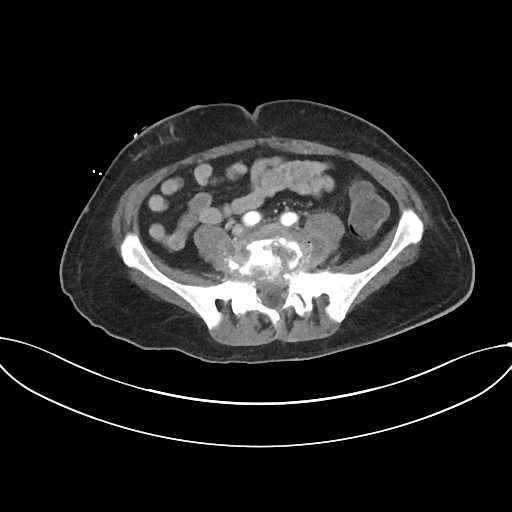
[im 59/63  soft-tissue]
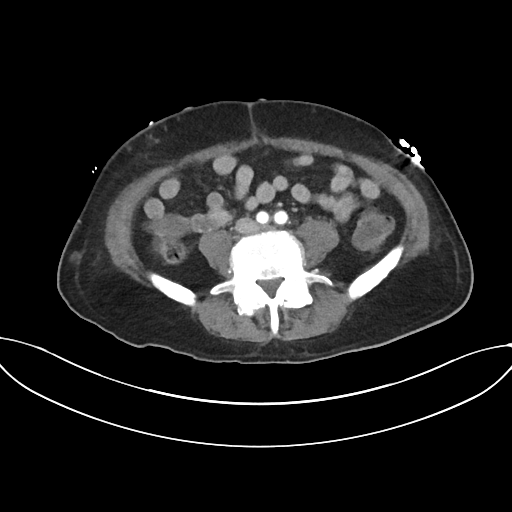

[Series 5: pelvis with 2.0 cor · coronal · 0.67mm/px · 3 of 116 slices shown]
[im 39/116  soft-tissue]
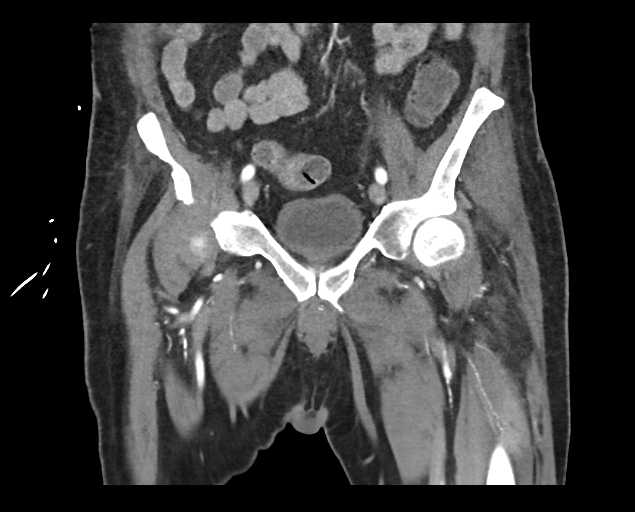
[im 52/116  soft-tissue]
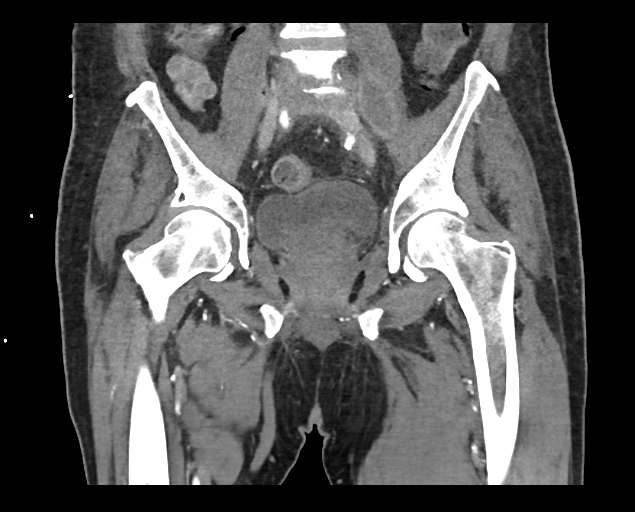
[im 64/116  soft-tissue]
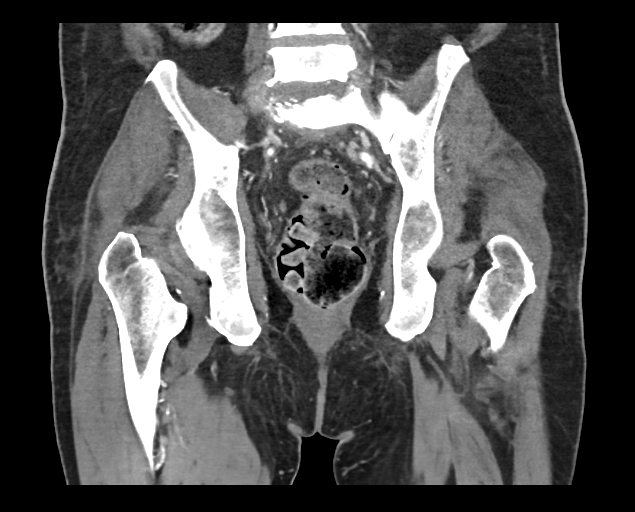

[16 of 46 positions shown; findings below may reference images not displayed]

FINDINGS: Urinary Tract:  No abnormality visualized.

Bowel: The visualized bowel loops are unremarkable. The visualized
appendix is normal. There is moderate presacral edema likely related
to the inflammatory process at L5-S1. No discrete drainable fluid
collection within this region.

Vascular/Lymphatic: The abdominal vasculature is unremarkable. No
pathologic adenopathy within the pelvis.

Reproductive:  Prostate gland is unremarkable.

Other: Left psoas abscess is again identified measuring 21 x 23 x 46
mm in greatest dimension. Smaller 12 mm right psoas abscesses are
present adjacent to L5-S1. Extensive paravertebral inflammatory
stranding and soft tissue infiltration is seen at L5-S1 in keeping
with changes of discitis osteomyelitis at this location. Associated
endplate erosion is noted at this level. Small posterior collection
abuts the thecal sac at this level, unchanged from prior exam.

Musculoskeletal: Aside from inflammatory changes related to L5-S1
discitis osteomyelitis, there is again noted widening of the left
L5-S1 facet with cortical erosion in keeping with septic arthritis
in this location. No other bone abnormality is identified.
IMPRESSION: Discitis/osteomyelitis L5-S1 with bilateral psoas abscesses
measuring up to 46 mm in greatest dimension on the left. Extensive
paravertebral soft tissue inflammatory change with mild edema
extending into the presacral space. Septic arthritis involving the
left L5-S1 facet joint.

## 2021-03-17 MED ORDER — IOHEXOL 300 MG/ML  SOLN
100.0000 mL | Freq: Once | INTRAMUSCULAR | Status: AC | PRN
  Administered 2021-03-17: 100 mL via INTRAVENOUS

## 2021-03-17 MED ORDER — CEFAZOLIN SODIUM-DEXTROSE 1-4 GM/50ML-% IV SOLN
1.0000 g | INTRAVENOUS | Status: DC
  Administered 2021-03-18 – 2021-04-04 (×18): 1 g via INTRAVENOUS
  Filled 2021-03-17 (×20): qty 50

## 2021-03-17 NOTE — Progress Notes (Signed)
Inpatient Rehab Admissions Coordinator Note:   Per therapy recommendations, pt was screened for CIR candidacy by Milam Allbaugh, MS CCC-SLP. At this time, Pt. Appears to have functional decline and is a good candidate for CIR. Will place order for rehab consult per protocol.  Please contact me with questions.   Siyah Mault, MS, CCC-SLP Rehab Admissions Coordinator  [phone] (celll) [phone] (office)  

## 2021-03-17 NOTE — Progress Notes (Signed)
IR was requested for image guided psoas abscess drain placement.   Case discussed with Dr. Serafina Royals, who recommends CT pelvis with contrast for further evaluation.    Ordering provider notified vis secure chat.    Will review CT pelvis w/ once done.  Please call IR for questions and concerns.   Armando Gang Ariyona Eid PA-C 03/17/2021 11:23 AM

## 2021-03-17 NOTE — Progress Notes (Signed)
PROGRESS NOTE    HOANG PETTINGILL  TKP:[id number] DOB: [birth year] DOA: 03/14/2021 PCP: Pcp, No    Brief Narrative:  Ryan Wells is a 65 y.o. male with medical history significant of ESRD on HD, hypertension, diabetes mellitus type 2, anemia of chronic disease, and MSSA bacteremia presented with complaints of back and leg pain. Symptoms have been acutely worse at least for the last 2 weeks.  He complains of having severe 10/10 sharp pains in his lower back that radiate down either leg.  He complains of his legs feeling cold and being unable to stand or move around due to symptoms.  Denies any recent trauma or injury to onset symptoms.  Due to the symptoms he had missed hemodialysis yesterday, and last dialyzed on 8/1.  The other day he reported having uncontrollable sweating which was unusual for him.   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  It appears during this hospitalization he had MRI imaging which noted bilateral L5-S1 facet osteoarthritis left greater than right with synovial cysts and periarticular soft tissue edema on the left which superimposed septic arthritis could not be excluded and anterior displacement of the cord with distortion at T6-T7.  He had completed 6 weeks of IV antibiotics with dialysis per ID notes from 8/2.   Patient was last in the emergency department on 7/25 complaining of radicular low back pain.  At that time neurosurgery had been consulted and recommended a short course of steroids and to follow-up in the office.  Patient does not appear to have done this.  He denies having shortness of breath, fever, chest pain, nausea, vomiting, or recent trauma.  It appears he was referred for physical therapy, but had not been able to do it due to pain.   In the emergency department patient was seen to be afebrile with stable vital signs.  CT imaging of the head and  orbits negative for any acute intracranial pathology, but noted left facial and periorbital soft tissue contusion.  Labs from 8/3 significant for WBC 15.1, hemoglobin 10, potassium 4.4, BUN 63, creatinine 8.5, anion gap 20, and high-sensitivity troponin 84->101.  Chest x-ray significant for cardiomegaly with vascular congestion, focal area with pulmonary nodularity of the right mid lung correlates with cavitating lesion and a small right-sided pleural effusion.  Influenza and COVID-19 screening both were negative.  He had been given full dose aspirin, nitroglycerin, ketorolac, Rocephin IV, and ciprofloxacin ophthalmic drops for concern for preseptal cellulitis and acute bacterial conjunctivitis.   Assessment & Plan:   Principal Problem:   Lumbar back pain Active Problems:   Subclinical hypothyroidism   Anemia due to chronic kidney disease   Diabetes mellitus type II, non insulin dependent (HCC)   ESRD on hemodialysis (HCC)   Pleural effusion   Elevated troponin   Leg pain   Leukocytosis   History of bacteremia   Lumbar/sacral osteomyelitis/discitis, L5-S1 Left psoas abscess Patient presents with acute worsening lumbar back pain with radiation down both legs to the point where he is unable to walk.  Patient with significant tenderness palpation of the lumbar spine or legs.  He makes it seem as though pain symptoms may be radiating down his legs. MRI of the lumbar spine from 01/17/2021 significant for severe bilateral foraminal stenosis at L5-S1, moderate bilateral stenosis at L4-L5, bilateral facet osteoarthritis worse on the left, and noted to have posteriorly projecting synovial cyst with periarticular soft tissue  edema on the left for which there was concern for the possibility of superimposed septic arthritis. Concern for septic arthritis vs discitis vs osteomyelitis of the lumbar spine and recent MSSA septicemia.  Completed 6-week course of antibiotics per infectious disease, on 8/2.  WBC  elevated 15.1, ESR 54 and CRP 22.1.  MR L-spine without contrast 8/5 notable for L5-S1 discitis with osteomyelitis with left psoas abscess measuring 4.7 centimeters; no epidural collection identified. --Neurosurgery following, appreciate assistance --Consulted IR for psoas aspiration/drainage --Consulted infectious disease today --WBC 15.1>12.3>11.8>12.3 --Blood cultures x2 8/4: No growth x 2 days --Oxycodone as needed for pain --Continue empiric antibiotics of Rocephin IV --CBC daily   Left face contusion: Acute.   Patient was noted to have left facial and periorbital soft tissue contusion on CT imaging.  It appears he had initially been given ciprofloxacin and Rocephin IV for concern for periorbital cellulitis with concern for bacterial conjunctivitis.  However, on physical exam patient without significant erythema pain at this time. --Continue supportive care for now   Elevated troponin: Acute.   Patient denies any complaints of chest pain.  High-sensitivity troponin 84 -101.  EKG without significant ischemic changes.  Suspect secondary to demand. -Continue to monitor on telemetry   Right-sided pleural effusion:  Patient noted to have cardiomegaly with vascular congestion and small left-sided pleural effusion on imaging.  Patient did not appear to be grossly fluid overloaded on physical exam.  Oxygenating well on room air.  Continue volume management with HD.   ESRD on HD:  Patient had missed hemodialysis yesterday due to having significant pain, but labs appear to be relatively stable. --Continue hemodialysis per nephrology   Diabetes mellitus type 2:  Patient well controlled not on any medications for treatment.  Last hemoglobin A1c was 5.1 on 01/16/2021. --Continue renal and carb modified diet   History of MSSA bacteremia:  Patient had been treated and completed his 6 weeks of antibiotics from his previous hospitalization in June.  It appears he had no signs of endocarditis on  TEE.   Anemia chronic kidney disease:  Hemoglobin 10 g/dL which appears improved from previous, but baseline appears to be 8 g/dL. -Continue to monitor   Subclinical hypothyroidism:  TSH 6.154, free T4 normal at 1.01.  Weakness/deconditioning/gait disturbance: --PT/OT following, currently recommending CIR on discharge   DVT prophylaxis: heparin injection 5,000 Units Start: 03/15/21 1400   Code Status: Full Code Family Communication: No family present at bedside this morning.  Disposition Plan:  Level of care: Telemetry Medical Status is: Inpatient  Remains inpatient appropriate because:Ongoing diagnostic testing needed not appropriate for outpatient work up, Unsafe d/c plan, IV treatments appropriate due to intensity of illness or inability to take PO, and Inpatient level of care appropriate due to severity of illness  Dispo: The patient is from: Home              Anticipated d/c is to: Home              Patient currently is not medically stable to d/c.   Difficult to place patient No   Consultants:  Neurosurgery Nephrology Infectious disease Interventional radiology  Procedures:  None  Antimicrobials:  Ceftriaxone 8/4>>   Subjective: Patient seen examined at bedside, resting comfortably.  In bedside chair, assisted with video interpreter Mardene Celeste [phone].  Patient continues with low back pain, believes that it is related to his position in the bedside chair.  Patient underwent MRI L-spine yesterday with findings of osteomyelitis/discitis at L5-S1 with  4.7 cm left psoas abscess.  Discussed with patient at bedside regarding treatment options to include need for abscess drainage and ID consultation.  He agrees with current plan.  Discussed with general surgery who recommended IR for drainage.  No other questions or concerns at this time. Denies headache, no visual changes, no chest pain, palpitations, no shortness of breath, no abdominal pain, no weakness, no fatigue, no  paresthesias.  No acute events overnight per nurse staff.  Objective: Vitals:   03/16/21 1210 03/16/21 1350 03/16/21 2206 03/17/21 0451  BP: 132/66 125/60 (!) 119/54 132/64  Pulse: 73 72 68 66  Resp: $Remo'10  19 17  'sbhVy$ Temp: 97.7 F (36.5 C) 98.4 F (36.9 C) 98.5 F (36.9 C) 98.2 F (36.8 C)  TempSrc:      SpO2: 99% 97% 99% 97%  Weight:      Height:        Intake/Output Summary (Last 24 hours) at 03/17/2021 1106 Last data filed at 03/16/2021 1800 Gross per 24 hour  Intake 207.18 ml  Output 3973 ml  Net -3765.82 ml   Filed Weights   03/14/21 2254 03/16/21 0815  Weight: 69 kg 63.2 kg    Examination:  General exam: Appears calm and comfortable, poor dentition Respiratory system: Clear to auscultation. Respiratory effort normal. Cardiovascular system: S1 & S2 heard, RRR. No JVD, murmurs, rubs, gallops or clicks. No pedal edema. Gastrointestinal system: Abdomen is nondistended, soft and nontender. No organomegaly or masses felt. Normal bowel sounds heard. Central nervous system: Alert and oriented. No focal neurological deficits. Extremities: Symmetric 5 x 5 power.  Neurovascular intact, sensation to light touch intact. Skin: No rashes, lesions or ulcers Psychiatry: Judgement and insight appear normal. Mood & affect appropriate.     Data Reviewed: I have personally reviewed following labs and imaging studies  CBC: Recent Labs  Lab 03/14/21 2259 03/16/21 0049 03/16/21 0832 03/17/21 0219  WBC 15.1* 12.3* 11.8* 12.3*  NEUTROABS 12.8*  --   --   --   HGB 10.0* 9.4* 9.5* 10.3*  HCT 32.0* 29.9* 30.8* 34.0*  MCV 95.8 97.1 98.1 98.0  PLT 212 193 177 694   Basic Metabolic Panel: Recent Labs  Lab 03/14/21 2259 03/15/21 1150 03/16/21 0049 03/17/21 0219  NA 139 142 136 134*  K 4.4 4.0 4.1 3.7  CL 94* 95* 91* 94*  CO2 $Re'25 27 27 27  'moB$ GLUCOSE 173* 95 181* 178*  BUN 63* 70* 78* 34*  CREATININE 8.50* 9.60* 10.32* 5.94*  CALCIUM 7.9* 7.6* 7.4* 7.7*  PHOS  --  9.0* 9.0* 4.0    GFR: Estimated Creatinine Clearance: 9.4 mL/min (A) (by C-G formula based on SCr of 5.94 mg/dL (H)). Liver Function Tests: Recent Labs  Lab 03/14/21 2259 03/15/21 1150 03/16/21 0049 03/17/21 0219  AST 21  --   --   --   ALT 9  --   --   --   ALKPHOS 107  --   --   --   BILITOT 1.2  --   --   --   PROT 7.1  --   --   --   ALBUMIN 3.4* 2.4* 2.5* 2.5*   No results for input(s): LIPASE, AMYLASE in the last 168 hours. No results for input(s): AMMONIA in the last 168 hours. Coagulation Profile: No results for input(s): INR, PROTIME in the last 168 hours. Cardiac Enzymes: No results for input(s): CKTOTAL, CKMB, CKMBINDEX, TROPONINI in the last 168 hours. BNP (last 3 results) No results for  input(s): PROBNP in the last 8760 hours. HbA1C: No results for input(s): HGBA1C in the last 72 hours. CBG: No results for input(s): GLUCAP in the last 168 hours. Lipid Profile: No results for input(s): CHOL, HDL, LDLCALC, TRIG, CHOLHDL, LDLDIRECT in the last 72 hours. Thyroid Function Tests: Recent Labs    03/16/21 0049  TSH 6.154*  FREET4 1.01   Anemia Panel: No results for input(s): VITAMINB12, FOLATE, FERRITIN, TIBC, IRON, RETICCTPCT in the last 72 hours. Sepsis Labs: No results for input(s): PROCALCITON, LATICACIDVEN in the last 168 hours.  Recent Results (from the past 240 hour(s))  Resp Panel by RT-PCR (Flu A&B, Covid) Nasopharyngeal Swab     Status: None   Collection Time: 03/15/21  2:08 AM   Specimen: Nasopharyngeal Swab; Nasopharyngeal(NP) swabs in vial transport medium  Result Value Ref Range Status   SARS Coronavirus 2 by RT PCR NEGATIVE NEGATIVE Final    Comment: (NOTE) SARS-CoV-2 target nucleic acids are NOT DETECTED.  The SARS-CoV-2 RNA is generally detectable in upper respiratory specimens during the acute phase of infection. The lowest concentration of SARS-CoV-2 viral copies this assay can detect is 138 copies/mL. A negative result does not preclude  SARS-Cov-2 infection and should not be used as the sole basis for treatment or other patient management decisions. A negative result may occur with  improper specimen collection/handling, submission of specimen other than nasopharyngeal swab, presence of viral mutation(s) within the areas targeted by this assay, and inadequate number of viral copies(<138 copies/mL). A negative result must be combined with clinical observations, patient history, and epidemiological information. The expected result is Negative.  Fact Sheet for Patients:  EntrepreneurPulse.com.au  Fact Sheet for Healthcare Providers:  IncredibleEmployment.be  This test is no t yet approved or cleared by the [place] FDA and  has been authorized for detection and/or diagnosis of SARS-CoV-2 by FDA under an Emergency Use Authorization (EUA). This EUA will remain  in effect (meaning this test can be used) for the duration of the COVID-19 declaration under Section 564(b)(1) of the Act, 21 U.S.C.section 360bbb-3(b)(1), unless the authorization is terminated  or revoked sooner.       Influenza A by PCR NEGATIVE NEGATIVE Final   Influenza B by PCR NEGATIVE NEGATIVE Final    Comment: (NOTE) The Xpert Xpress SARS-CoV-2/FLU/RSV plus assay is intended as an aid in the diagnosis of influenza from Nasopharyngeal swab specimens and should not be used as a sole basis for treatment. Nasal washings and aspirates are unacceptable for Xpert Xpress SARS-CoV-2/FLU/RSV testing.  Fact Sheet for Patients: EntrepreneurPulse.com.au  Fact Sheet for Healthcare Providers: IncredibleEmployment.be  This test is not yet approved or cleared by the [place] FDA and has been authorized for detection and/or diagnosis of SARS-CoV-2 by FDA under an Emergency Use Authorization (EUA). This EUA will remain in effect (meaning this test can be used) for the duration of  the COVID-19 declaration under Section 564(b)(1) of the Act, 21 U.S.C. section 360bbb-3(b)(1), unless the authorization is terminated or revoked.  Performed at [facility], [address], [place], [place] [address]   Culture, blood (routine x 2)     Status: None (Preliminary result)   Collection Time: 03/15/21  3:39 PM   Specimen: BLOOD  Result Value Ref Range Status   Specimen Description BLOOD RIGHT ANTECUBITAL  Final   Special Requests   Final    BOTTLES DRAWN AEROBIC AND ANAEROBIC Blood Culture results may not be optimal due to an inadequate volume of blood received in  culture bottles   Culture   Final    NO GROWTH 2 DAYS Performed at [facility], [address], [place], [place] [address]    Report Status PENDING  Incomplete  Culture, blood (routine x 2)     Status: None (Preliminary result)   Collection Time: 03/15/21  3:47 PM   Specimen: BLOOD RIGHT HAND  Result Value Ref Range Status   Specimen Description BLOOD RIGHT HAND  Final   Special Requests   Final    BOTTLES DRAWN AEROBIC AND ANAEROBIC Blood Culture adequate volume   Culture   Final    NO GROWTH 2 DAYS Performed at [facility], [address] [place], [place] [address]    Report Status PENDING  Incomplete         Radiology Studies: MR LUMBAR SPINE WO CONTRAST  Result Date: 03/16/2021 CLINICAL DATA:  Low back pain, > 6 wks; Low back pain, infection suspected. EXAM: MRI LUMBAR SPINE WITHOUT CONTRAST TECHNIQUE: Multiplanar, multisequence MR imaging of the lumbar spine was performed. No intravenous contrast was administered. COMPARISON:  MRI of the lumbar spine January 17, 2021. FINDINGS: Segmentation:  Standard. Alignment: Trace anterolisthesis of L4 over L5. Straightening of the lumbar lordosis Vertebrae: Interval development of prominent endplate erosion and edema at L5-S1 with fluid signal within the corresponding intervertebral disc, consistent with discitis/osteomyelitis.  There is perivertebral soft tissue edema with a fluid collection within the left psoas muscle measuring approximately 4.7 x 2.5 x 2.1 cm, consistent with abscess. No definitive evidence of epidural fluid collection. Persistent marrow edema at the left L5-S1 facet joint, may also be related to septic arthritis. Conus medullaris and cauda equina: Conus extends to the L2 level. Conus and cauda equina appear normal. Paraspinal and other soft tissues: Left psoas abscess, as described above. Disc levels: T12-L1: No spinal canal or neural foraminal stenosis. L1-2: Shallow disc bulge and mild facet degenerative changes without significant spinal canal or neural foraminal stenosis. L2-3: Mild facet degenerative changes without significant spinal canal or neural foraminal stenosis. L3-4: Shallow disc bulge, mild right and moderate left facet degenerative changes resulting in mild-to-moderate left neural foraminal narrowing. L4-5: Disc bulge and advanced hypertrophic facet degenerative changes resulting in mild spinal canal stenosis and moderate bilateral neural foraminal narrowing, unchanged. L5-S1: Disc bulge, moderate right and advanced left facet degenerative changes resulting in narrowing of the bilateral subarticular zones and severe bilateral neural foraminal narrowing. IMPRESSION: 1. Findings consistent with L5-S1 discitis/osteomyelitis with associated left psoas abscess measuring up to 4.7 cm. No epidural collection identified. 2. Persistent marrow and periarticular soft tissue edema at the left L5-S1 facet joint, may be related to septic arthritis. 3. Moderate bilateral neural foraminal narrowing at L4-5 and severe bilateral neural foraminal narrowing at L5-S1. 4. No high-grade spinal canal stenosis. Electronically Signed   By: Baldemar Lenis M.D.   On: 03/16/2021 16:29        Scheduled Meds:  Chlorhexidine Gluconate Cloth  6 each Topical Q0600   darbepoetin (ARANESP) injection - DIALYSIS  100  mcg Intravenous Q Fri-HD   doxercalciferol  3 mcg Intravenous Q M,W,F-HD   feeding supplement (NEPRO CARB STEADY)  237 mL Oral BID BM   gabapentin  100 mg Oral QHS   heparin  5,000 Units Subcutaneous Q8H   midodrine  10 mg Oral TID WC   multivitamin  1 tablet Oral QHS   sodium chloride flush  3 mL Intravenous Q12H   sucroferric oxyhydroxide  1,000 mg Oral TID WC   Continuous Infusions:  cefTRIAXone (ROCEPHIN)  IV 1 g (03/17/21 0231)   iron sucrose 100 mg (03/16/21 1036)     LOS: 2 days    Time spent: 45 minutes spent on chart review, discussion with nursing staff, consultants, updating family and interview/physical exam; more than 50% of that time was spent in counseling and/or coordination of care.    Kolleen Ochsner J [place] DO [facility] Available via Epic secure chat 7am-7pm After these hours, please refer to coverage provider listed on [email or web address] 03/17/2021, 11:06 AM

## 2021-03-17 NOTE — Progress Notes (Signed)
Spoke with Nephrologist - Marval Regal, MD regarding CT with contrast. It is okay to do CT with contrast without receiving dialysis 24 hours afterwards.

## 2021-03-17 NOTE — Progress Notes (Signed)
Pharmacy Antibiotic Note  Ryan Wells is a 65 y.o. male admitted on 03/14/2021 with cc of back and leg pain found to have discitis, osteomyelitis and MSSA bacteremia.  ESRD on HD (MWF). Recent hx of MSSA bacteremia with septic PE and potential endocarditis s/p 6 weeks cefazolin (completed on 8/2). Pharmacy has been consulted for cefazolin dosing.  Plan: Discontinued ceftriaxone and narrowed therapy to cefazolin 1g q24h  F/u Bcx, clinical status and LOT   Height: '5\' 2"'$  (157.5 cm) Weight: 63.2 kg (139 lb 5.3 oz) IBW/kg (Calculated) : 54.6  Temp (24hrs), Avg:98.2 F (36.8 C), Min:97.7 F (36.5 C), Max:98.5 F (36.9 C)  Recent Labs  Lab 03/14/21 2259 03/15/21 1150 03/16/21 0049 03/16/21 0832 03/17/21 0219  WBC 15.1*  --  12.3* 11.8* 12.3*  CREATININE 8.50* 9.60* 10.32*  --  5.94*    Estimated Creatinine Clearance: 9.4 mL/min (A) (by C-G formula based on SCr of 5.94 mg/dL (H)).    No Known Allergies  Antimicrobials this admission: Ceftriaxone 8/4 >> 8/6 Cefazolin 8/6>>  Dose adjustments this admission:   Microbiology results: 8/4 BCx: NGTD   Thank you for allowing pharmacy to be a part of this patient's care.  Levonne Spiller 03/17/2021 11:44 AM

## 2021-03-17 NOTE — Consult Note (Addendum)
[facility]                                                                                        Patient Identification: Patient Name: Ryan Wells MRN: [id number] [place] Date: 03/14/2021 11:29 PM Today's Date: 03/17/2021 Reason for consult: Psoas abscess, discitis and osteomyelitis Requesting provider: Arlana Hove  Principal Problem:   Lumbar back pain Active Problems:   Subclinical hypothyroidism   Anemia due to chronic kidney disease   Diabetes mellitus type II, non insulin dependent ([place])   ESRD on hemodialysis (HCC)   Pleural effusion   Elevated troponin   Leg pain   Leukocytosis   History of bacteremia   Antibiotics: Ceftriaxone 8/3-current  Lines/Tubes: PIV's, left upper arm AV fistula/graft, right IJ tunneled hemodialysis catheter  Assessment L5-S1 discitis and osteomyelitis Left psoas abscess ( 4.7cm) Possible left L5S1 septic arthritis Severe degenerative disc disease with severe bilateral neural foraminal narrowing at L5-S1 Left facial and periorbital soft tissue contusion - on ceftriaxone and reports improved swelling. 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 Please send sample for gram stain and cultures when sample aspirated Monitor CBC and BMP Baseline ESR and CRP Follow blood cultures Plan discussed with patient/Pharmacy and primary   Rest of the management as per the primary team.  Please call with questions or concerns.  Thank you for the consult  Rosiland Oz, MD Infectious Disease Physician [facility] [address] [place], [place] [address] Phone: [phone]  Fax: [phone]  __________________________________________________________________________________________________________ HPI and Hospital Course: 65 year old male with PMH of DM, HTN, ESRD on HD, chronic back and leg pain, history of recent MSSA bacteria status post treatment who presented to the ED on 8/4 with complaints of worsening back and leg pain for last 2 weeks.  History taken with the help of video Spanish interpreter cart review . back pain is 10 out of 10 and radiates down to his either leg.  Also had sweating. Denies any fevers and chills. Denies any chest pain/cough and SOB. Denies any nausea, vomiting, abdominal pain and diarrhea. He feels weak in his bilateral legs to the extent he has difficulty putting weight on.  Denies any pain in the neck and upper back.   Recently hospitalized 0/1-7/51 complicated MSSA bacteremia with septic pulmonary emboli/endocarditis.  Patient was also seen in the ED on 7/26 for lower back pain and was thought to be degenerative. he has completed 6 weeks of cefazolin with HD and was seen by Dr.Comer on 8/2 no acute concerns at that time.   Complaint of intermittent swelling of the bilateral eyes,  history of difficulty with vision ongoing for for a while in the setting of diabetes, no recent worsening.   At ED, he was afebrile, WBC up to 15.1.  CRP 22.1 and ESR 54 CT head, orbits and MRI lumbar spine findings as below  ROS: General- Denies fever, chills, loss of appetite and loss of weight HEENT - Denies headache, neck pain, sinus pain Chest - Denies any chest pain, SOB or cough CVS- Denies any dizziness/lightheadedness, syncopal attacks, palpitations Abdomen- Denies any nausea, vomiting, abdominal pain,  hematochezia and diarrhea Psych - Denies any changes in mood irritability or depressive symptoms GU- Denies any burning, dysuria, hematuria or increased frequency of urination Skin - denies any rashes/lesions MSK - denies any peripheral joint pain/swelling or restricted ROM   Past Medical History:  Diagnosis Date   3rd nerve palsy, partial, 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  Did not get outpatient stress testing following hospitalization.  No chest pain since   Chronic kidney disease    on dialysis Tues, Thurs and Sat   Diabetes mellitus without complication ([place])    Diabetes type 2, uncontrolled ([place]) 11/25/2011   no meds, diet controlled per patient   Diabetic gastroparesis ([place]) 01/22/2017   Hypertension    no meds   Microalbuminuria 01/19/2017   Myocardial infarction [facility]) 2015   Past Surgical History:  Procedure Laterality Date   A/V FISTULAGRAM Left 06/04/2019   Procedure: A/V FISTULAGRAM;  Surgeon: Angelia Mould, MD;  Location: [place] [facility];  Service: Cardiovascular;  Laterality: Left;   A/V FISTULAGRAM Left 10/15/2019   Procedure: A/V FISTULAGRAM;  Surgeon: Angelia Mould, MD;  Location: [place] [facility];  Service: Cardiovascular;  Laterality: Left;   A/V FISTULAGRAM N/A 01/24/2021   Procedure: A/V FISTULAGRAM - Left Upper;  Surgeon: Cherre Robins, MD;  Location: [place] [facility];  Service: Cardiovascular;  Laterality: N/A;   AV FISTULA PLACEMENT Left 07/02/2018   Procedure: BRACHIOCEPHALIC ARTERIOVENOUS (AV) FISTULA CREATION LEFT ARM;  Surgeon: Serafina Mitchell, MD;  Location: [place];  Service: Vascular;  Laterality: Left;   [place] Left 07/26/2019   Procedure: Bascilic Vein Transposition;  Surgeon: Angelia Mould, MD;  Location: [place];  Service: Vascular;  Laterality: Left;   COLONOSCOPY     EMBOLIZATION Left  06/04/2019   Procedure: EMBOLIZATION;  Surgeon: Angelia Mould, MD;  Location: [place] [facility];  Service: Cardiovascular;  Laterality: Left;  LT ARM FISTULA/COMPETING BRANCH   EYE SURGERY Bilateral    cataracts x2   EYE SURGERY     INSERTION OF DIALYSIS CATHETER Right 06/08/2019   Procedure: INSERTION OF PALINDROME DIALYSIS CATHETER IN RIGHT INTERNAL JUGULAR;  Surgeon: Angelia Mould, MD;  Location: [place];  Service: Vascular;  Laterality: Right;   LIGATION OF ARTERIOVENOUS  FISTULA Left 07/26/2019   Procedure: Ligation Of BrachioCephalic  Fistula;  Surgeon: Angelia Mould, MD;  Location: [place];  Service: Vascular;  Laterality: Left;   NECK SURGERY     PERIPHERAL VASCULAR BALLOON ANGIOPLASTY Left 06/04/2019   Procedure: PERIPHERAL VASCULAR BALLOON ANGIOPLASTY;  Surgeon: Angelia Mould, MD;  Location: [place] [facility];  Service: Cardiovascular;  Laterality: Left;  ARM FISTULA   PERIPHERAL VASCULAR BALLOON ANGIOPLASTY Left 10/15/2019   Procedure: PERIPHERAL VASCULAR BALLOON ANGIOPLASTY;  Surgeon: Angelia Mould, MD;  Location: [place] [facility]  LAB;  Service: Cardiovascular;  Laterality: Left;  arm fistula   TEE WITHOUT CARDIOVERSION N/A 01/19/2021   Procedure: TRANSESOPHAGEAL ECHOCARDIOGRAM (TEE);  Surgeon: Jerline Pain, MD;  Location: [facility] ENDOSCOPY;  Service: Cardiovascular;  Laterality: N/A;     Scheduled Meds:  Chlorhexidine Gluconate Cloth  6 each Topical Q0600   darbepoetin (ARANESP) injection - DIALYSIS  100 mcg Intravenous Q Fri-HD   doxercalciferol  3 mcg Intravenous Q M,W,F-HD   feeding supplement (NEPRO CARB STEADY)  237 mL Oral BID BM   gabapentin  100 mg Oral QHS   heparin  5,000 Units Subcutaneous Q8H   midodrine  10 mg Oral TID WC   multivitamin  1 tablet Oral QHS   sodium chloride flush  3 mL Intravenous Q12H   sucroferric oxyhydroxide  1,000 mg Oral TID WC   Continuous Infusions:  cefTRIAXone (ROCEPHIN)  IV 1 g (03/17/21 0231)    iron sucrose 100 mg (03/16/21 1036)   PRN Meds:.acetaminophen **OR** acetaminophen, albuterol, oxyCODONE-acetaminophen  No Known Allergies  Social History   Socioeconomic History   Marital status: Married    Spouse name: Mariana Arn   Number of children: 3   Years of education: 1 year university   Highest education level: Not on file  Occupational History   Occupation: unemployed  Tobacco Use   Smoking status: Former   Smokeless tobacco: Never  Scientific laboratory technician Use: Never used  Substance and Sexual Activity   Alcohol use: Not Currently   Drug use: Never   Sexual activity: Yes  Other Topics Concern   Not on file  Social History Narrative   ** Merged History Encounter **       Originally from [place] Came to [place]. In 1985 Lives in [place] neighborhood with wife and 3 sons.   Sons are 11-30 yo Some sons help with support   Social Determinants of Radio broadcast assistant Strain: Not on file  Food Insecurity: Not on file  Transportation Needs: Not on file  Physical Activity: Not on file  Stress: Not on file  Social Connections: Not on file  Intimate Partner Violence: Not on file     Vitals BP 132/64 (BP Location: Right Arm)   Pulse 66   Temp 98.2 F (36.8 C)   Resp 17   Ht $R'5\' 2"'qr$  (1.575 m)   Wt 63.2 kg   SpO2 97%   BMI 25.48 kg/m    Physical Exam Constitutional: Lying in bed, not in acute distress but severe pain when attempting to sit up    Comments:   Cardiovascular:     Rate and Rhythm: Normal rate and regular rhythm.     Heart sounds: No murmur heard.  Systolic murmur  Pulmonary:     Effort: Pulmonary effort is normal.     Comments: On room air, bilateral equal air entry and occasional Rales  Abdominal:     Palpations: Abdomen is soft.     Tenderness: Nontender and nondistended  Musculoskeletal:        General: No swelling or tenderness.    Skin:    Comments: Left upper AV fistula site-no erythema tenderness and  swelling  Neurological:     General: No focal deficit present.                    Lower lumbar spinal tenderness, no tenderness in the C-spine and thoracic spine.  He is able to lift bilateral lower extremities  up from the bed against resistance.  Able to push from his bilateral feet against resistance.                     Awake alert and oriented  Psychiatric:        Mood and Affect: Mood normal.  Calm and cooperative  Pertinent Microbiology Results for orders placed or performed during the hospital encounter of 03/14/21  Resp Panel by RT-PCR (Flu A&B, Covid) Nasopharyngeal Swab     Status: None   Collection Time: 03/15/21  2:08 AM   Specimen: Nasopharyngeal Swab; Nasopharyngeal(NP) swabs in vial transport medium  Result Value Ref Range Status   SARS Coronavirus 2 by RT PCR NEGATIVE NEGATIVE Final    Comment: (NOTE) SARS-CoV-2 target nucleic acids are NOT DETECTED.  The SARS-CoV-2 RNA is generally detectable in upper respiratory specimens during the acute phase of infection. The lowest concentration of SARS-CoV-2 viral copies this assay can detect is 138 copies/mL. A negative result does not preclude SARS-Cov-2 infection and should not be used as the sole basis for treatment or other patient management decisions. A negative result may occur with  improper specimen collection/handling, submission of specimen other than nasopharyngeal swab, presence of viral mutation(s) within the areas targeted by this assay, and inadequate number of viral copies(<138 copies/mL). A negative result must be combined with clinical observations, patient history, and epidemiological information. The expected result is Negative.  Fact Sheet for Patients:  BloggerCourse.com  Fact Sheet for Healthcare Providers:  SeriousBroker.it  This test is no t yet approved or cleared by the [place] FDA and  has been authorized for detection and/or diagnosis  of SARS-CoV-2 by FDA under an Emergency Use Authorization (EUA). This EUA will remain  in effect (meaning this test can be used) for the duration of the COVID-19 declaration under Section 564(b)(1) of the Act, 21 U.S.C.section 360bbb-3(b)(1), unless the authorization is terminated  or revoked sooner.       Influenza A by PCR NEGATIVE NEGATIVE Final   Influenza B by PCR NEGATIVE NEGATIVE Final    Comment: (NOTE) The Xpert Xpress SARS-CoV-2/FLU/RSV plus assay is intended as an aid in the diagnosis of influenza from Nasopharyngeal swab specimens and should not be used as a sole basis for treatment. Nasal washings and aspirates are unacceptable for Xpert Xpress SARS-CoV-2/FLU/RSV testing.  Fact Sheet for Patients: BloggerCourse.com  Fact Sheet for Healthcare Providers: SeriousBroker.it  This test is not yet approved or cleared by the [place] FDA and has been authorized for detection and/or diagnosis of SARS-CoV-2 by FDA under an Emergency Use Authorization (EUA). This EUA will remain in effect (meaning this test can be used) for the duration of the COVID-19 declaration under Section 564(b)(1) of the Act, 21 U.S.C. section 360bbb-3(b)(1), unless the authorization is terminated or revoked.  Performed at [facility], [address], [place], [place] [address]   Culture, blood (routine x 2)     Status: None (Preliminary result)   Collection Time: 03/15/21  3:39 PM   Specimen: BLOOD  Result Value Ref Range Status   Specimen Description BLOOD RIGHT ANTECUBITAL  Final   Special Requests   Final    BOTTLES DRAWN AEROBIC AND ANAEROBIC Blood Culture results may not be optimal due to an inadequate volume of blood received in culture bottles   Culture   Final    NO GROWTH 2 DAYS Performed at [facility], [address], [place], [place] [address]  Report Status PENDING  Incomplete  Culture, blood  (routine x 2)     Status: None (Preliminary result)   Collection Time: 03/15/21  3:47 PM   Specimen: BLOOD RIGHT HAND  Result Value Ref Range Status   Specimen Description BLOOD RIGHT HAND  Final   Special Requests   Final    BOTTLES DRAWN AEROBIC AND ANAEROBIC Blood Culture adequate volume   Culture   Final    NO GROWTH 2 DAYS Performed at [facility], [address] [place], [place] [address]    Report Status PENDING  Incomplete    Pertinent Lab seen by me: CBC Latest Ref Rng & Units 03/17/2021 03/16/2021 03/16/2021  WBC 4.0 - 10.5 K/uL 12.3(H) 11.8(H) 12.3(H)  Hemoglobin 13.0 - 17.0 g/dL 10.3(L) 9.5(L) 9.4(L)  Hematocrit 39.0 - 52.0 % 34.0(L) 30.8(L) 29.9(L)  Platelets 150 - 400 K/uL 193 177 193   CMP Latest Ref Rng & Units 03/17/2021 03/16/2021 03/15/2021  Glucose 70 - 99 mg/dL 178(H) 181(H) 95  BUN 8 - 23 mg/dL 34(H) 78(H) 70(H)  Creatinine 0.61 - 1.24 mg/dL 5.94(H) 10.32(H) 9.60(H)  Sodium 135 - 145 mmol/L 134(L) 136 142  Potassium 3.5 - 5.1 mmol/L 3.7 4.1 4.0  Chloride 98 - 111 mmol/L 94(L) 91(L) 95(L)  CO2 22 - 32 mmol/L $RemoveB'27 27 27  '[id number]$ Calcium 8.9 - 10.3 mg/dL 7.7(L) 7.4(L) 7.6(L)  Total Protein 6.5 - 8.1 g/dL - - -  Total Bilirubin 0.3 - 1.2 mg/dL - - -  Alkaline Phos 38 - 126 U/L - - -  AST 15 - 41 U/L - - -  ALT 0 - 44 U/L - - -    Pertinent Imagings/Other Imagings Plain films and CT images have been personally visualized and interpreted; radiology reports have been reviewed. Decision making incorporated into the Impression / Recommendations.  CT head and Orbit 03/15/21 FINDINGS: CT HEAD FINDINGS   Brain: There is mild age-related atrophy and chronic microvascular ischemic changes. There is no acute intracranial hemorrhage. No mass effect or midline shift. No extra-axial fluid collection.   Vascular: No hyperdense vessel or unexpected calcification.   Skull: Normal. Negative for fracture or focal lesion.   Other: None   CT ORBITS FINDINGS   Evaluation of this  exam is limited in the absence of intravenous contrast.   Orbits: The globes and retro-orbital fat are preserved. No acute or traumatic injury.   Visualized sinuses: There is mild diffuse mucoperiosteal thickening of paranasal sinuses. No air-fluid level. Mild bilateral mastoid effusions.   Soft tissues: Left facial and periorbital soft tissue contusion. No large hematoma.   IMPRESSION: 1. No acute intracranial pathology. 2. No acute/traumatic orbital pathology. 3. Left facial and periorbital soft tissue contusion.  MRI Lumbar spine 03/16/21 IMPRESSION: 1. Findings consistent with L5-S1 discitis/osteomyelitis with associated left psoas abscess measuring up to 4.7 cm. No epidural collection identified. 2. Persistent marrow and periarticular soft tissue edema at the left L5-S1 facet joint, may be related to septic arthritis. 3. Moderate bilateral neural foraminal narrowing at L4-5 and severe bilateral neural foraminal narrowing at L5-S1. 4. No high-grade spinal canal stenosis.  Chest xray 03/14/21  IMPRESSION: 1. Cardiomegaly with vascular congestion. 2. Focal area of subpleural nodularity along the right mid lung field likely corresponds to the previously seen cavitary lesion. 3. Small right pleural effusion.   I spent more than 70  minutes for this patient encounter including review of prior medical records/discussing diagnostics and treatment plan with the patient/family/coordinate care with  primary/other specialits with greater than 50% of time in face to face encounter.   Electronically signed by:   Rosiland Oz, MD Infectious Disease Physician [facility] for Infectious Disease Pager: [phone]

## 2021-03-17 NOTE — Evaluation (Addendum)
Occupational Therapy Evaluation Patient Details Name: Ryan Wells MRN: [id number] DOB: Apr 29, 1956 Today's Date: 03/17/2021    History of Present Illness pt is 65 y.o. male with medical history significant of ESRD on HD, hypertension, diabetes mellitus type 2, anemia of chronic disease, and MSSA bacteremia presented with complaints of back and leg pain.  Symptoms have been acutely worse at least for the last 2 weeks. 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  It appears during this hospitalization he had MRI imaging which noted bilateral L5-S1 facet osteoarthritis left greater than right with synovial cysts and periarticular soft tissue edema on the left which superimposed septic arthritis could not be excluded and anterior displacement of the cord with distortion at T6-T7.  He had completed 6 weeks of IV antibiotics with dialysis per ID notes from 8/2.   Clinical Impression   Pt with excellent participation with session, endorsing his baseline prior to onset of back pain being Indep living with family. Recently pt endorses "crawling around the house", and relying heavily on family support to complete ADL's. At this time pt with pain fluctuating from 7-9/10 with movement, recovering slowly with rest, and requiring up to mod A for functional self care transfers and mod-max ADL's both sitting/standing. Depending upon physical ability to provide A and S of family, anticipate pt to benefit from post acute OT once pain is management to address functional deficits of strength, balance and activity tolerance with rec for post acute OT at CIR level to maximize indep and safety with ADL's while also addressing pain management. Will continue to follow acutely to determine most appropriate d/c plan.     Follow Up Recommendations  Supervision/Assistance - 24 hour;CIR;Other (comment) (for  assist with pain management)    Equipment Recommendations  Other (comment) (will need to confirm what equip is accessible to pt at home already prior to recommendation)    Recommendations for Other Services       Precautions / Restrictions Precautions Precautions:  (Back pain) Restrictions Weight Bearing Restrictions: No      Mobility Bed Mobility Overal bed mobility: Needs Assistance Bed Mobility: Rolling;Supine to Sit Rolling: Mod assist   Supine to sit: Mod assist     General bed mobility comments: increased time, cues for log roll technique to attempt to assist with pain control and comfort.    Transfers Overall transfer level: Needs assistance Equipment used: 1 person hand held assist Transfers: Sit to/from [facility] Sit to Stand: Min assist Stand pivot transfers: Mod assist       General transfer comment: mod cues for sequencing and encouargement, excellent effort from pt but up to mod A to safely transition to recliner at bedside for simulated toilet transfers    Balance Overall balance assessment: History of Falls                                         ADL either performed or assessed with clinical judgement   ADL Overall ADL's : Needs assistance/impaired Eating/Feeding: Set up;Supervision/ safety;Sitting Eating/Feeding Details (indicate cue type and reason): in recliner Grooming: Wash/dry face;Wash/dry hands;Sitting;Supervision/safety           Upper Body Dressing : Minimal assistance;Sitting   Lower Body Dressing: Maximal assistance;Sit to/from stand;Sitting/lateral leans Lower Body Dressing Details (indicate cue type and  reason): sitting EOB for donn/doff socks unable to tolerate removal of UE support from EOB for seated LB dressing tasks. Toilet Transfer: Moderate assistance Toilet Transfer Details (indicate cue type and reason): gait belt and cues for safety and sequencing Toileting- Clothing Manipulation and  Hygiene: Moderate assistance;Sit to/from stand         General ADL Comments: pt significantly limited in participation and indep with ADL's this date-     Vision Baseline Vision/History: No visual deficits       Perception     Praxis      Pertinent Vitals/Pain Pain Assessment: 0-10 Pain Score: 9  Pain Location: thoracic to lumbar, at times B thighs Pain Descriptors / Indicators: Burning;Aching;Cramping;Discomfort;Grimacing;Guarding Pain Intervention(s): Limited activity within patient's tolerance;Relaxation;Monitored during session (alerted RN at end of session)     Hand Dominance Right   Extremity/Trunk Assessment Upper Extremity Assessment Upper Extremity Assessment: Generalized weakness   Lower Extremity Assessment Lower Extremity Assessment: Defer to PT evaluation   Cervical / Trunk Assessment Cervical / Trunk Assessment: Normal   Communication Communication Communication: Interpreter utilized;Prefers language other than English (spanish- use of stratus)   Cognition Arousal/Alertness: Awake/alert Behavior During Therapy: WFL for tasks assessed/performed Overall Cognitive Status: Within Functional Limits for tasks assessed                                     General Comments  grossly VSS on RA    Exercises     Shoulder Instructions      Home Living Family/patient expects to be discharged to:: Private residence Living Arrangements: Spouse/significant other Available Help at Discharge: Family;Available 24 hours/day Type of Home: House Home Access: Stairs to enter     Home Layout: Two level     Bathroom Shower/Tub: Occupational psychologist: Standard Bathroom Accessibility: No   Home Equipment: Wheelchair - Brewing technologist   Additional Comments: pt stating "I have lots of equip but can not use any of it"- unable to further elaborate outside of w/c what equip he has at home.      Prior Functioning/Environment Level of  Independence: Needs assistance  Gait / Transfers Assistance Needed: relying on family for A with pivot transfers ADL's / Homemaking Assistance Needed: A for bathing and dressing, completing most dressing in supine d/t back pain limiting   Comments: typically pt would be indep with ADL's and mobility but recently since last admission back pain has been significantly limiting all safety and indep.        OT Problem List: Decreased strength;Decreased activity tolerance;Impaired balance (sitting and/or standing);Decreased safety awareness;Decreased knowledge of use of DME or AE;Decreased knowledge of precautions      OT Treatment/Interventions: Self-care/ADL training;Therapeutic exercise;DME and/or AE instruction;Therapeutic activities;Patient/family education    OT Goals(Current goals can be found in the care plan section) Acute Rehab OT Goals Patient Stated Goal: to be able to do for myself OT Goal Formulation: With patient Time For Goal Achievement: 03/31/21 Potential to Achieve Goals: Good ADL Goals Pt Will Perform Upper Body Dressing: with supervision;sitting Pt Will Perform Lower Body Dressing: with supervision;sit to/from stand;with adaptive equipment Pt Will Transfer to Toilet: with supervision;bedside commode Pt Will Perform Toileting - Clothing Manipulation and hygiene: with supervision;sit to/from stand  OT Frequency: Min 2X/week   Barriers to D/C:            Co-evaluation  AM-PAC OT "6 Clicks" Daily Activity     Outcome Measure Help from another person eating meals?: A Little Help from another person taking care of personal grooming?: A Little Help from another person toileting, which includes using toliet, bedpan, or urinal?: A Lot Help from another person bathing (including washing, rinsing, drying)?: A Lot Help from another person to put on and taking off regular upper body clothing?: A Little Help from another person to put on and taking off  regular lower body clothing?: Total 6 Click Score: 14   End of Session Equipment Utilized During Treatment: Gait belt Nurse Communication: Mobility status  Activity Tolerance: Patient limited by pain Patient left: in chair;with chair alarm set;with nursing/sitter in room;with call bell/phone within reach  OT Visit Diagnosis: Unsteadiness on feet (R26.81);Repeated falls (R29.6);Muscle weakness (generalized) (M62.81)                Time: [id number] OT Time Calculation (min): 42 min Charges:  OT General Charges $OT Visit: 1 Visit OT Evaluation $OT Eval Low Complexity: 1 Low OT Treatments $Self Care/Home Management : 8-22 mins Michell Kader OTR/L acute rehab services Office: [phone]  03/17/2021, 9:40 AM

## 2021-03-17 NOTE — Progress Notes (Addendum)
Patient ID: Ryan Wells, male   DOB: 26-Jul-1956, 65 y.o.   MRN: [id number] S: MRI results noted.  Resting comfortably this morning. O:BP 132/64 (BP Location: Right Arm)   Pulse 66   Temp 98.2 F (36.8 C)   Resp 17   Ht '5\' 2"'$  (1.575 m)   Wt 63.2 kg   SpO2 97%   BMI 25.48 kg/m   Intake/Output Summary (Last 24 hours) at 03/17/2021 0932 Last data filed at 03/16/2021 1800 Gross per 24 hour  Intake 207.18 ml  Output 3973 ml  Net -3765.82 ml   Intake/Output: I/O last 3 completed shifts: In: 207.2 [IV Piggyback:207.2] Out: [id number] [Other:3973]  Intake/Output this shift:  No intake/output data recorded. Weight change:  Gen: NAD CVS:RRR  Resp:CTA Abd: +BS, soft, NT/Nd Ext: no edema, LUE AVF +TB  Recent Labs  Lab 03/14/21 2259 03/15/21 1150 03/16/21 0049 03/17/21 0219  NA 139 142 136 134*  K 4.4 4.0 4.1 3.7  CL 94* 95* 91* 94*  CO2 '25 27 27 27  '$ GLUCOSE 173* 95 181* 178*  BUN 63* 70* 78* 34*  CREATININE 8.50* 9.60* 10.32* 5.94*  ALBUMIN 3.4* 2.4* 2.5* 2.5*  CALCIUM 7.9* 7.6* 7.4* 7.7*  PHOS  --  9.0* 9.0* 4.0  AST 21  --   --   --   ALT 9  --   --   --    Liver Function Tests: Recent Labs  Lab 03/14/21 2259 03/15/21 1150 03/16/21 0049 03/17/21 0219  AST 21  --   --   --   ALT 9  --   --   --   ALKPHOS 107  --   --   --   BILITOT 1.2  --   --   --   PROT 7.1  --   --   --   ALBUMIN 3.4* 2.4* 2.5* 2.5*   No results for input(s): LIPASE, AMYLASE in the last 168 hours. No results for input(s): AMMONIA in the last 168 hours. CBC: Recent Labs  Lab 03/14/21 2259 03/16/21 0049 03/16/21 0832 03/17/21 0219  WBC 15.1* 12.3* 11.8* 12.3*  NEUTROABS 12.8*  --   --   --   HGB 10.0* 9.4* 9.5* 10.3*  HCT 32.0* 29.9* 30.8* 34.0*  MCV 95.8 97.1 98.1 98.0  PLT 212 193 177 193   Cardiac Enzymes: No results for input(s): CKTOTAL, CKMB, CKMBINDEX, TROPONINI in the last 168 hours. CBG: No results for input(s): GLUCAP in the last 168 hours.  Iron Studies: No results  for input(s): IRON, TIBC, TRANSFERRIN, FERRITIN in the last 72 hours. Studies/Results: MR LUMBAR SPINE WO CONTRAST  Result Date: 03/16/2021 CLINICAL DATA:  Low back pain, > 6 wks; Low back pain, infection suspected. EXAM: MRI LUMBAR SPINE WITHOUT CONTRAST TECHNIQUE: Multiplanar, multisequence MR imaging of the lumbar spine was performed. No intravenous contrast was administered. COMPARISON:  MRI of the lumbar spine January 17, 2021. FINDINGS: Segmentation:  Standard. Alignment: Trace anterolisthesis of L4 over L5. Straightening of the lumbar lordosis Vertebrae: Interval development of prominent endplate erosion and edema at L5-S1 with fluid signal within the corresponding intervertebral disc, consistent with discitis/osteomyelitis. There is perivertebral soft tissue edema with a fluid collection within the left psoas muscle measuring approximately 4.7 x 2.5 x 2.1 cm, consistent with abscess. No definitive evidence of epidural fluid collection. Persistent marrow edema at the left L5-S1 facet joint, may also be related to septic arthritis. Conus medullaris and cauda equina: Conus extends to the L2 level.  Conus and cauda equina appear normal. Paraspinal and other soft tissues: Left psoas abscess, as described above. Disc levels: T12-L1: No spinal canal or neural foraminal stenosis. L1-2: Shallow disc bulge and mild facet degenerative changes without significant spinal canal or neural foraminal stenosis. L2-3: Mild facet degenerative changes without significant spinal canal or neural foraminal stenosis. L3-4: Shallow disc bulge, mild right and moderate left facet degenerative changes resulting in mild-to-moderate left neural foraminal narrowing. L4-5: Disc bulge and advanced hypertrophic facet degenerative changes resulting in mild spinal canal stenosis and moderate bilateral neural foraminal narrowing, unchanged. L5-S1: Disc bulge, moderate right and advanced left facet degenerative changes resulting in narrowing of the  bilateral subarticular zones and severe bilateral neural foraminal narrowing. IMPRESSION: 1. Findings consistent with L5-S1 discitis/osteomyelitis with associated left psoas abscess measuring up to 4.7 cm. No epidural collection identified. 2. Persistent marrow and periarticular soft tissue edema at the left L5-S1 facet joint, may be related to septic arthritis. 3. Moderate bilateral neural foraminal narrowing at L4-5 and severe bilateral neural foraminal narrowing at L5-S1. 4. No high-grade spinal canal stenosis. Electronically Signed   By: Pedro Earls M.D.   On: 03/16/2021 16:29    Chlorhexidine Gluconate Cloth  6 each Topical Q0600   darbepoetin (ARANESP) injection - DIALYSIS  100 mcg Intravenous Q Fri-HD   doxercalciferol  3 mcg Intravenous Q M,W,F-HD   feeding supplement (NEPRO CARB STEADY)  237 mL Oral BID BM   gabapentin  100 mg Oral QHS   heparin  5,000 Units Subcutaneous Q8H   midodrine  10 mg Oral TID WC   multivitamin  1 tablet Oral QHS   sodium chloride flush  3 mL Intravenous Q12H   sucroferric oxyhydroxide  1,000 mg Oral TID WC    BMET    Component Value Date/Time   NA 134 (L) 03/17/2021 0219   NA 140 12/19/2016 1514   K 3.7 03/17/2021 0219   CL 94 (L) 03/17/2021 0219   CO2 27 03/17/2021 0219   GLUCOSE 178 (H) 03/17/2021 0219   BUN 34 (H) 03/17/2021 0219   BUN 25 12/19/2016 1514   CREATININE 5.94 (H) 03/17/2021 0219   CREATININE 0.95 12/06/2013 1634   CALCIUM 7.7 (L) 03/17/2021 0219   CALCIUM 8.1 (L) 04/09/2018 1447   GFRNONAA 10 (L) 03/17/2021 0219   GFRNONAA 87 12/06/2013 1634   GFRAA 8 (L) 06/09/2019 0517   GFRAA >89 12/06/2013 1634   CBC    Component Value Date/Time   WBC 12.3 (H) 03/17/2021 0219   RBC 3.47 (L) 03/17/2021 0219   HGB 10.3 (L) 03/17/2021 0219   HGB 14.4 12/19/2016 1514   HCT 34.0 (L) 03/17/2021 0219   HCT 42.8 12/19/2016 1514   PLT 193 03/17/2021 0219   PLT 258 12/19/2016 1514   MCV 98.0 03/17/2021 0219   MCV 88  12/19/2016 1514   MCH 29.7 03/17/2021 0219   MCHC 30.3 03/17/2021 0219   RDW 18.4 (H) 03/17/2021 0219   RDW 14.0 12/19/2016 1514   LYMPHSABS 1.3 03/14/2021 2259   LYMPHSABS 1.9 12/19/2016 1514   MONOABS 0.9 03/14/2021 2259   EOSABS 0.0 03/14/2021 2259   EOSABS 0.0 12/19/2016 1514   BASOSABS 0.0 03/14/2021 2259   BASOSABS 0.0 12/19/2016 1514      Dialysis Orders:[facility] M[facility]  3h 36mn  180NRe 400/600  64kg   2/2 bath  P4  AVF   -Heparin  2200 units IV TIW -Mircera 150 mcg IV q 2 week (last dose 150  mcg IV 02/28/2021) -Venofer 100 mg IV X 10 doses (4/6 doses given.  -Hectorol 3 mcg IV TIW     Assessment/Plan: Pain in Back/BLE-rates as severe and interfering with QOL. Per primary.  Concern that this pain is related to his recent bacteremia-  MRI consistent with LF-S1 discitis/osteomyelitis.  No high grade spinal canal stenosis.  Await further ID input.   Pulmonary edema: Vascular congestion, small R pleural effusion on CXR but appears mild in comparison to CXR 01/2021. He has not been eating well, has most likely lost body wt. No acute dialysis needs today. No evidence of overt volume overload.   Elevated troponin: In setting of vascular congestion and ESRD. Troponin 84, 101 so far. Denies chest pain. Per primary Preseptal cellulitis with bacterial conjunctivitis-per primary  ESRD -  Missed HD 03/14/21 but HD 03/16/2021. 2.0 K bath-K+ 4.4. Low dose heparin. SCr 8.5 BUN 63. No evidence of uremia. Now back on outpatient schedule.   Hypertension/volume  - As noted above. BP well controlled. On midodrine 10 mg PO TID. No evidence of overt volume overload by exam.  Lower EDW as tolerated.  Anemia  - HGB 10.3.  Missed OP ESA 08/03. Give today with HD. Continue Fe load.   Metabolic bone disease - Continue velphoro 500 mg 2 tabs PO TID AC. Continue VDRA.   Nutrition - Renal carb mod diet, nepro, renal vits.  DM-No meds on OP list. Last HA1c 6.9. per primary  Donetta Potts, MD [facility] [phone]

## 2021-03-17 NOTE — Evaluation (Signed)
Physical Therapy Evaluation Patient Details Name: Ryan Wells MRN: [id number] DOB: [birth year] Today's Date: 03/17/2021   History of Present Illness  65 y.o. male presenting to ED on 8/3 with severe back/leg pain x2 weeks and diaphoresis. Suspicion for septic arthritis of lumbar spine. 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 At time of previous admission MRI (+) L5-S1 facet osteoarthritis, synovial cysts and periarticular soft tissue edema on the left. 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 Returned to ED 7/25 c/o radicular low back pain. Neurosurgery recommended short course of steroids and to follow up in office. Patient does not appear to have done this. Patient also reports inability to participate with PT services secondary to pain. PMHx significant for ESRD on HD, HTN, DMII, chornic anemia and MSSA.  Clinical Impression   Pt admitted secondary to problem above with deficits below. PTA patient was very limited due to severe back pain (crawling inside home due to fear of falling). Prior to this he was independent with all mobility.  Pt currently requires moderate assist for stand-pivot transfer with RW due to pain and LE weakness/numbness.  Anticipate patient will benefit from PT to address problems listed below.Will continue to follow acutely to maximize functional mobility independence and safety.  Recommending CIR for pain management and intensive therapies to prepare pt for discharge home.      Follow Up Recommendations CIR (for pain management and therapies)    Equipment Recommendations  None recommended by PT    Recommendations for Other Services Rehab consult     Precautions / Restrictions Precautions Precautions: Fall;Other (comment) Precaution Comments: back pain; denies falls but endorses began crawling to prevent falling Restrictions Weight Bearing Restrictions: No      Mobility  Bed  Mobility Overal bed mobility: Needs Assistance Bed Mobility: Rolling;Sit to Sidelying Rolling: Supervision (from left side to back)   Supine to sit: Mod assist   Sit to sidelying: Mod assist General bed mobility comments: increased time, cues for technique to attempt to assist with pain control and comfort.    Transfers Overall transfer level: Needs assistance Equipment used: Rolling walker (2 wheeled) Transfers: Sit to/from [facility] Sit to Stand: Min assist Stand pivot transfers: Mod assist       General transfer comment: mod cues for sequencing and encouargement, excellent effort from pt but up to mod A to safely transition  Ambulation/Gait             General Gait Details: unsafe with 1 person assist  Stairs            Wheelchair Mobility    Modified Rankin (Stroke Patients Only)       Balance Overall balance assessment: Needs assistance Sitting-balance support: Bilateral upper extremity supported;Feet supported Sitting balance-Leahy Scale: Poor     Standing balance support: Bilateral upper extremity supported Standing balance-Leahy Scale: Poor                               Pertinent Vitals/Pain Pain Assessment: 0-10 Pain Score: 7  Pain Location: thoracic to lumbar, at times B thighs Pain Descriptors / Indicators: Burning;Aching;Cramping;Discomfort;Grimacing;Guarding;Numbness Pain Intervention(s): Limited activity within patient's tolerance;Monitored during session;Premedicated before session;Repositioned    Home Living Family/patient expects to be discharged to:: Private residence Living Arrangements: Spouse/significant other;Children Available Help at Discharge: Family;Available 24 hours/day Type of Home: House Home Access: Stairs to enter     Home  Layout: Two level;Able to live on main level with bedroom/bathroom Home Equipment: Wheelchair - Insurance claims handler - 2 wheels Additional Comments: pt  stating "I have lots of equip but can not use any of it"- unable to further elaborate outside of w/c what equip he has at home.    Prior Function Level of Independence: Needs assistance   Gait / Transfers Assistance Needed: relying on family for A with pivot transfers; has been crawling instead of walking, including crawling up/down stairs to upstairs bedroom  ADL's / Homemaking Assistance Needed: A for bathing and dressing, completing most dressing in supine d/t back pain limiting  Comments: typically pt would be indep with ADL's and mobility but recently since last admission back pain has been significantly limiting all safety and indep.     Hand Dominance   Dominant Hand: Right    Extremity/Trunk Assessment   Upper Extremity Assessment Upper Extremity Assessment: Generalized weakness    Lower Extremity Assessment Lower Extremity Assessment: Generalized weakness (limited due to pain bil LEs and back)    Cervical / Trunk Assessment Cervical / Trunk Assessment: Normal  Communication   Communication: Interpreter utilized;Prefers language other than English (spanish- use of virtual interpreter)  Cognition Arousal/Alertness: Awake/alert Behavior During Therapy: WFL for tasks assessed/performed Overall Cognitive Status: Within Functional Limits for tasks assessed                                        General Comments General comments (skin integrity, edema, etc.): grossly VSS on RA    Exercises Other Exercises Other Exercises: encouraged AROM with UE and LEs and pt reports he does small bouts of exercise throughtout the day because he does not want to get stiff and weak   Assessment/Plan    PT Assessment Patient needs continued PT services  PT Problem List Decreased strength;Decreased activity tolerance;Decreased balance;Decreased mobility;Decreased knowledge of use of DME;Decreased knowledge of precautions;Impaired sensation;Pain       PT Treatment  Interventions DME instruction;Gait training;Stair training;Functional mobility training;Therapeutic activities;Therapeutic exercise;Balance training;Patient/family education    PT Goals (Current goals can be found in the Care Plan section)  Acute Rehab PT Goals Patient Stated Goal: to be able to do for myself PT Goal Formulation: With patient Time For Goal Achievement: 03/31/21 Potential to Achieve Goals: Fair    Frequency Min 4X/week   Barriers to discharge        Co-evaluation               AM-PAC PT "6 Clicks" Mobility  Outcome Measure Help needed turning from your back to your side while in a flat bed without using bedrails?: A Lot Help needed moving from lying on your back to sitting on the side of a flat bed without using bedrails?: A Lot Help needed moving to and from a bed to a chair (including a wheelchair)?: A Lot Help needed standing up from a chair using your arms (e.g., wheelchair or bedside chair)?: A Lot Help needed to walk in hospital room?: Total Help needed climbing 3-5 steps with a railing? : Total 6 Click Score: 10    End of Session Equipment Utilized During Treatment: Gait belt Activity Tolerance: Patient limited by pain Patient left: in bed;with call bell/phone within reach;with bed alarm set Nurse Communication: Mobility status PT Visit Diagnosis: Difficulty in walking, not elsewhere classified (R26.2);Pain Pain - Right/Left:  (central back; bil LEs)  Time: 1002-1029 PT Time Calculation (min) (ACUTE ONLY): 27 min   Charges:   PT Evaluation $PT Eval Low Complexity: 1 Low PT Treatments $Therapeutic Activity: 8-22 mins         Arby Barrette, PT Pager [phone]   Rexanne Mano 03/17/2021, 10:45 AM

## 2021-03-18 ENCOUNTER — Encounter (HOSPITAL_COMMUNITY): Payer: Self-pay | Admitting: Internal Medicine

## 2021-03-18 DIAGNOSIS — M545 Low back pain, unspecified: Secondary | ICD-10-CM | POA: Diagnosis not present

## 2021-03-18 LAB — RENAL FUNCTION PANEL
Albumin: 2.4 g/dL — ABNORMAL LOW (ref 3.5–5.0)
Anion gap: 14 (ref 5–15)
BUN: 50 mg/dL — ABNORMAL HIGH (ref 8–23)
CO2: 27 mmol/L (ref 22–32)
Calcium: 8.2 mg/dL — ABNORMAL LOW (ref 8.9–10.3)
Chloride: 92 mmol/L — ABNORMAL LOW (ref 98–111)
Creatinine, Ser: 7.79 mg/dL — ABNORMAL HIGH (ref 0.61–1.24)
GFR, Estimated: 7 mL/min — ABNORMAL LOW (ref >60)
Glucose, Bld: 157 mg/dL — ABNORMAL HIGH (ref 70–99)
Phosphorus: 4.2 mg/dL (ref 2.5–4.6)
Potassium: 4.5 mmol/L (ref 3.5–5.1)
Sodium: 133 mmol/L — ABNORMAL LOW (ref 135–145)

## 2021-03-18 LAB — CBC
HCT: 35.5 % — ABNORMAL LOW (ref 39.0–52.0)
Hemoglobin: 10.6 g/dL — ABNORMAL LOW (ref 13.0–17.0)
MCH: 29.6 pg (ref 26.0–34.0)
MCHC: 29.9 g/dL — ABNORMAL LOW (ref 30.0–36.0)
MCV: 99.2 fL (ref 80.0–100.0)
Platelets: 209 10*3/uL (ref 150–400)
RBC: 3.58 MIL/uL — ABNORMAL LOW (ref 4.22–5.81)
RDW: 18 % — ABNORMAL HIGH (ref 11.5–15.5)
WBC: 12.9 10*3/uL — ABNORMAL HIGH (ref 4.0–10.5)
nRBC: 0.2 % (ref 0.0–0.2)

## 2021-03-18 NOTE — Progress Notes (Signed)
Physical Therapy Treatment Patient Details Name: Ryan Wells MRN: [id number] DOB: Oct 26, 1955 Today's Date: 03/18/2021    History of Present Illness 65 y.o. male presenting to ED on 8/3 with severe back/leg pain x2 weeks and diaphoresis. Suspicion for septic arthritis of lumbar spine. 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 At time of previous admission MRI (+) L5-S1 facet osteoarthritis, synovial cysts and periarticular soft tissue edema on the left. 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 Returned to ED 7/25 c/o radicular low back pain. Neurosurgery recommended short course of steroids and to follow up in office. Patient does not appear to have done this. Patient also reports inability to participate with PT services secondary to pain. PMHx significant for ESRD on HD, HTN, DMII, chornic anemia and MSSA.    PT Comments    Patient much more limited by pain this date. Tolerated sitting EOB <15 seconds and returned to sidelying due to pain. Patient had hoped to get OOB to sit in chair and perhaps walk, however severe pain prevented this. Continue to educate on best ways to move/mobilize to minimize back pain. Noted potential plans to drain psoas abscess.     Follow Up Recommendations  CIR (for pain management and therapies)     Equipment Recommendations  None recommended by PT    Recommendations for Other Services Rehab consult     Precautions / Restrictions Precautions Precautions: Fall;Other (comment) Precaution Comments: back pain; denies falls but endorses began crawling to prevent falling Restrictions Weight Bearing Restrictions: No    Mobility  Bed Mobility Overal bed mobility: Needs Assistance Bed Mobility: Rolling;Sit to Sidelying;Sidelying to Sit Rolling: Supervision (with rail) Sidelying to sit: Max assist;+2 for physical assistance     Sit to sidelying: Mod assist General bed mobility  comments: incr time to roll onto his side with cues for technique; much worse pain this date as coming to sitting and immediately began to lie back down    Transfers                 General transfer comment: unable to attempt due to severe pain  Ambulation/Gait                 Stairs             Wheelchair Mobility    Modified Rankin (Stroke Patients Only)       Balance Overall balance assessment: Needs assistance Sitting-balance support: Bilateral upper extremity supported;Feet supported Sitting balance-Leahy Scale: Poor                                      Cognition Arousal/Alertness: Awake/alert Behavior During Therapy: WFL for tasks assessed/performed Overall Cognitive Status: Within Functional Limits for tasks assessed                                        Exercises      General Comments General comments (skin integrity, edema, etc.): virtual interpreter [facility]) utilized      Pertinent Vitals/Pain Pain Assessment: 0-10 Pain Score: 9  Pain Location: thoracic to lumbar, at times B thighs Pain Descriptors / Indicators: Burning;Aching;Cramping;Discomfort;Grimacing;Guarding;Numbness Pain Intervention(s): Limited activity within patient's tolerance;Monitored during session;Repositioned    Home Living  Prior Function            PT Goals (current goals can now be found in the care plan section) Acute Rehab PT Goals Patient Stated Goal: to be able to do for myself Time For Goal Achievement: 03/31/21 Potential to Achieve Goals: Fair Progress towards PT goals: Not progressing toward goals - comment (limited by pain)    Frequency    Min 4X/week      PT Plan Current plan remains appropriate    Co-evaluation              AM-PAC PT "6 Clicks" Mobility   Outcome Measure  Help needed turning from your back to your side while in a flat bed without using bedrails?: A  Lot Help needed moving from lying on your back to sitting on the side of a flat bed without using bedrails?: Total Help needed moving to and from a bed to a chair (including a wheelchair)?: Total Help needed standing up from a chair using your arms (e.g., wheelchair or bedside chair)?: Total Help needed to walk in hospital room?: Total Help needed climbing 3-5 steps with a railing? : Total 6 Click Score: 7    End of Session   Activity Tolerance: Patient limited by pain Patient left: in bed;with call bell/phone within reach;with bed alarm set   PT Visit Diagnosis: Difficulty in walking, not elsewhere classified (R26.2);Pain Pain - Right/Left:  (central back; bil LEs)     Time: [id number] PT Time Calculation (min) (ACUTE ONLY): 18 min  Charges:  $Therapeutic Activity: 8-22 mins                      Arby Barrette, PT Pager [phone]    Rexanne Mano 03/18/2021, 11:36 AM

## 2021-03-18 NOTE — Progress Notes (Signed)
Patient ID: Ryan Wells, male   DOB: October 26, 1955, 65 y.o.   MRN: [id number] S: Still complaining of back pain and inability to sit up O:BP (!) 132/58   Pulse 66   Temp 98 F (36.7 C) (Oral)   Resp 18   Ht '5\' 2"'$  (1.575 m)   Wt 63.2 kg   SpO2 99%   BMI 25.48 kg/m   Intake/Output Summary (Last 24 hours) at 03/18/2021 0955 Last data filed at 03/18/2021 0400 Gross per 24 hour  Intake 220 ml  Output --  Net 220 ml   Intake/Output: I/O last 3 completed shifts: In: 220 [P.O.:220] Out: -   Intake/Output this shift:  No intake/output data recorded. Weight change:  Gen:NAD CVS:RRR Resp: CTA Abd: +BS, soft, NT/ND Ext: no edema, LAVF +T/B  Recent Labs  Lab 03/14/21 2259 03/15/21 1150 03/16/21 0049 03/17/21 0219 03/18/21 0349  NA 139 142 136 134* 133*  K 4.4 4.0 4.1 3.7 4.5  CL 94* 95* 91* 94* 92*  CO2 '25 27 27 27 27  '$ GLUCOSE 173* 95 181* 178* 157*  BUN 63* 70* 78* 34* 50*  CREATININE 8.50* 9.60* 10.32* 5.94* 7.79*  ALBUMIN 3.4* 2.4* 2.5* 2.5* 2.4*  CALCIUM 7.9* 7.6* 7.4* 7.7* 8.2*  PHOS  --  9.0* 9.0* 4.0 4.2  AST 21  --   --   --   --   ALT 9  --   --   --   --    Liver Function Tests: Recent Labs  Lab 03/14/21 2259 03/15/21 1150 03/16/21 0049 03/17/21 0219 03/18/21 0349  AST 21  --   --   --   --   ALT 9  --   --   --   --   ALKPHOS 107  --   --   --   --   BILITOT 1.2  --   --   --   --   PROT 7.1  --   --   --   --   ALBUMIN 3.4*   < > 2.5* 2.5* 2.4*   < > = values in this interval not displayed.   No results for input(s): LIPASE, AMYLASE in the last 168 hours. No results for input(s): AMMONIA in the last 168 hours. CBC: Recent Labs  Lab 03/14/21 2259 03/16/21 0049 03/16/21 0832 03/17/21 0219 03/18/21 0349  WBC 15.1* 12.3* 11.8* 12.3* 12.9*  NEUTROABS 12.8*  --   --   --   --   HGB 10.0* 9.4* 9.5* 10.3* 10.6*  HCT 32.0* 29.9* 30.8* 34.0* 35.5*  MCV 95.8 97.1 98.1 98.0 99.2  PLT 212 193 177 193 209   Cardiac Enzymes: No results for  input(s): CKTOTAL, CKMB, CKMBINDEX, TROPONINI in the last 168 hours. CBG: No results for input(s): GLUCAP in the last 168 hours.  Iron Studies: No results for input(s): IRON, TIBC, TRANSFERRIN, FERRITIN in the last 72 hours. Studies/Results: CT PELVIS W CONTRAST  Result Date: 03/17/2021 CLINICAL DATA:  Abscess, anal or rectal psoas abcess EXAM: CT PELVIS WITH CONTRAST TECHNIQUE: Multidetector CT imaging of the pelvis was performed using the standard protocol following the bolus administration of intravenous contrast. CONTRAST:  117m OMNIPAQUE IOHEXOL 300 MG/ML  SOLN COMPARISON:  CT abdomen pelvis 01/15/2021, MRI lumbar spine 03/16/2021 FINDINGS: Urinary Tract:  No abnormality visualized. Bowel: The visualized bowel loops are unremarkable. The visualized appendix is normal. There is moderate presacral edema likely related to the inflammatory process at L5-S1. No discrete drainable fluid  collection within this region. Vascular/Lymphatic: The abdominal vasculature is unremarkable. No pathologic adenopathy within the pelvis. Reproductive:  Prostate gland is unremarkable. Other: Left psoas abscess is again identified measuring 21 x 23 x 46 mm in greatest dimension. Smaller 12 mm right psoas abscesses are present adjacent to L5-S1. Extensive paravertebral inflammatory stranding and soft tissue infiltration is seen at L5-S1 in keeping with changes of discitis osteomyelitis at this location. Associated endplate erosion is noted at this level. Small posterior collection abuts the thecal sac at this level, unchanged from prior exam. Musculoskeletal: Aside from inflammatory changes related to L5-S1 discitis osteomyelitis, there is again noted widening of the left L5-S1 facet with cortical erosion in keeping with septic arthritis in this location. No other bone abnormality is identified. IMPRESSION: Discitis/osteomyelitis L5-S1 with bilateral psoas abscesses measuring up to 46 mm in greatest dimension on the left.  Extensive paravertebral soft tissue inflammatory change with mild edema extending into the presacral space. Septic arthritis involving the left L5-S1 facet joint. Electronically Signed   By: Fidela Salisbury MD   On: 03/17/2021 15:52   MR LUMBAR SPINE WO CONTRAST  Result Date: 03/16/2021 CLINICAL DATA:  Low back pain, > 6 wks; Low back pain, infection suspected. EXAM: MRI LUMBAR SPINE WITHOUT CONTRAST TECHNIQUE: Multiplanar, multisequence MR imaging of the lumbar spine was performed. No intravenous contrast was administered. COMPARISON:  MRI of the lumbar spine January 17, 2021. FINDINGS: Segmentation:  Standard. Alignment: Trace anterolisthesis of L4 over L5. Straightening of the lumbar lordosis Vertebrae: Interval development of prominent endplate erosion and edema at L5-S1 with fluid signal within the corresponding intervertebral disc, consistent with discitis/osteomyelitis. There is perivertebral soft tissue edema with a fluid collection within the left psoas muscle measuring approximately 4.7 x 2.5 x 2.1 cm, consistent with abscess. No definitive evidence of epidural fluid collection. Persistent marrow edema at the left L5-S1 facet joint, may also be related to septic arthritis. Conus medullaris and cauda equina: Conus extends to the L2 level. Conus and cauda equina appear normal. Paraspinal and other soft tissues: Left psoas abscess, as described above. Disc levels: T12-L1: No spinal canal or neural foraminal stenosis. L1-2: Shallow disc bulge and mild facet degenerative changes without significant spinal canal or neural foraminal stenosis. L2-3: Mild facet degenerative changes without significant spinal canal or neural foraminal stenosis. L3-4: Shallow disc bulge, mild right and moderate left facet degenerative changes resulting in mild-to-moderate left neural foraminal narrowing. L4-5: Disc bulge and advanced hypertrophic facet degenerative changes resulting in mild spinal canal stenosis and moderate bilateral  neural foraminal narrowing, unchanged. L5-S1: Disc bulge, moderate right and advanced left facet degenerative changes resulting in narrowing of the bilateral subarticular zones and severe bilateral neural foraminal narrowing. IMPRESSION: 1. Findings consistent with L5-S1 discitis/osteomyelitis with associated left psoas abscess measuring up to 4.7 cm. No epidural collection identified. 2. Persistent marrow and periarticular soft tissue edema at the left L5-S1 facet joint, may be related to septic arthritis. 3. Moderate bilateral neural foraminal narrowing at L4-5 and severe bilateral neural foraminal narrowing at L5-S1. 4. No high-grade spinal canal stenosis. Electronically Signed   By: Pedro Earls M.D.   On: 03/16/2021 16:29    Chlorhexidine Gluconate Cloth  6 each Topical Q0600   darbepoetin (ARANESP) injection - DIALYSIS  100 mcg Intravenous Q Fri-HD   doxercalciferol  3 mcg Intravenous Q M,W,F-HD   feeding supplement (NEPRO CARB STEADY)  237 mL Oral BID BM   gabapentin  100 mg Oral QHS  heparin  5,000 Units Subcutaneous Q8H   midodrine  10 mg Oral TID WC   multivitamin  1 tablet Oral QHS   sodium chloride flush  3 mL Intravenous Q12H   sucroferric oxyhydroxide  1,000 mg Oral TID WC    BMET    Component Value Date/Time   NA 133 (L) 03/18/2021 0349   NA 140 12/19/2016 1514   K 4.5 03/18/2021 0349   CL 92 (L) 03/18/2021 0349   CO2 27 03/18/2021 0349   GLUCOSE 157 (H) 03/18/2021 0349   BUN 50 (H) 03/18/2021 0349   BUN 25 12/19/2016 1514   CREATININE 7.79 (H) 03/18/2021 0349   CREATININE 0.95 12/06/2013 1634   CALCIUM 8.2 (L) 03/18/2021 0349   CALCIUM 8.1 (L) 04/09/2018 1447   GFRNONAA 7 (L) 03/18/2021 0349   GFRNONAA 87 12/06/2013 1634   GFRAA 8 (L) 06/09/2019 0517   GFRAA >89 12/06/2013 1634   CBC    Component Value Date/Time   WBC 12.9 (H) 03/18/2021 0349   RBC 3.58 (L) 03/18/2021 0349   HGB 10.6 (L) 03/18/2021 0349   HGB 14.4 12/19/2016 1514   HCT 35.5  (L) 03/18/2021 0349   HCT 42.8 12/19/2016 1514   PLT 209 03/18/2021 0349   PLT 258 12/19/2016 1514   MCV 99.2 03/18/2021 0349   MCV 88 12/19/2016 1514   MCH 29.6 03/18/2021 0349   MCHC 29.9 (L) 03/18/2021 0349   RDW 18.0 (H) 03/18/2021 0349   RDW 14.0 12/19/2016 1514   LYMPHSABS 1.3 03/14/2021 2259   LYMPHSABS 1.9 12/19/2016 1514   MONOABS 0.9 03/14/2021 2259   EOSABS 0.0 03/14/2021 2259   EOSABS 0.0 12/19/2016 1514   BASOSABS 0.0 03/14/2021 2259   BASOSABS 0.0 12/19/2016 1514    Dialysis Orders:[facility]  3h 65mn  180NRe 400/600  64kg   2/2 bath  P4  AVF   -Heparin  2200 units IV TIW -Mircera 150 mcg IV q 2 week (last dose 150 mcg IV 02/28/2021) -Venofer 100 mg IV X 10 doses (4/6 doses given.  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  Neurosurgery evaluated and no intervention deemed necessary.  Per ID input, will change to cefazolin (as likely MSSA given h/o MSSA bacteremia recently).  IR consulted for drainage of psoas abscess for GS, cx, and sensitivity.   Pulmonary edema: Vascular congestion, small R pleural effusion on CXR but appears mild in comparison to CXR 01/2021. He has not been eating well, has most likely lost body wt. No acute dialysis needs today. No evidence of overt volume overload.   Elevated troponin: In setting of vascular congestion and ESRD. Troponin 84, 101 so far. Denies chest pain. Per primary Preseptal cellulitis with bacterial conjunctivitis-per primary  ESRD -  Missed HD 03/14/21 but HD 03/16/2021. 2.0 K bath-K+ 4.4. Low dose heparin. SCr 8.5 BUN 63. No evidence of uremia. Now back on outpatient schedule. At present time he is not able to sit up without significant discomfort.  He may need rehab before he can be discharged to home for outpatient dialysis.   Hypertension/volume  - As noted above. BP well controlled. On midodrine 10 mg PO TID. No  evidence of overt volume overload by exam.  Lower EDW as tolerated.  Anemia  - HGB 10.3.  Missed OP ESA 08/03. Give today with HD. Continue Fe load.   Metabolic bone disease - Continue velphoro  500 mg 2 tabs PO TID AC. Continue VDRA.   Nutrition - Renal carb mod diet, nepro, renal vits.  DM-No meds on OP list. Last HA1c 6.9. per primary Disposition - will likely need SNF given severe debilitation from back pain and inability to sit up/ambulate.  Donetta Potts, MD [employer] [phone]

## 2021-03-18 NOTE — Progress Notes (Signed)
PROGRESS NOTE    Ryan Wells  ION:[id number] DOB: 1956-03-15 DOA: 03/14/2021 PCP: Pcp, No    Brief Narrative:  Ryan Wells is a 65 y.o. male with medical history significant of ESRD on HD, hypertension, diabetes mellitus type 2, anemia of chronic disease, and MSSA bacteremia presented with complaints of back and leg pain. Symptoms have been acutely worse at least for the last 2 weeks.  He complains of having severe 10/10 sharp pains in his lower back that radiate down either leg.  He complains of his legs feeling cold and being unable to stand or move around due to symptoms.  Denies any recent trauma or injury to onset symptoms.  Due to the symptoms he had missed hemodialysis yesterday, and last dialyzed on 8/1.  The other day he reported having uncontrollable sweating which was unusual for him.   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  It appears during this hospitalization he had MRI imaging which noted bilateral L5-S1 facet osteoarthritis left greater than right with synovial cysts and periarticular soft tissue edema on the left which superimposed septic arthritis could not be excluded and anterior displacement of the cord with distortion at T6-T7.  He had completed 6 weeks of IV antibiotics with dialysis per ID notes from 8/2.   Patient was last in the emergency department on 7/25 complaining of radicular low back pain.  At that time neurosurgery had been consulted and recommended a short course of steroids and to follow-up in the office.  Patient does not appear to have done this.  He denies having shortness of breath, fever, chest pain, nausea, vomiting, or recent trauma.  It appears he was referred for physical therapy, but had not been able to do it due to pain.   In the emergency department patient was seen to be afebrile with stable vital signs.  CT imaging of the head and  orbits negative for any acute intracranial pathology, but noted left facial and periorbital soft tissue contusion.  Labs from 8/3 significant for WBC 15.1, hemoglobin 10, potassium 4.4, BUN 63, creatinine 8.5, anion gap 20, and high-sensitivity troponin 84->101.  Chest x-ray significant for cardiomegaly with vascular congestion, focal area with pulmonary nodularity of the right mid lung correlates with cavitating lesion and a small right-sided pleural effusion.  Influenza and COVID-19 screening both were negative.  He had been given full dose aspirin, nitroglycerin, ketorolac, Rocephin IV, and ciprofloxacin ophthalmic drops for concern for preseptal cellulitis and acute bacterial conjunctivitis.   Assessment & Plan:   Principal Problem:   Lumbar back pain Active Problems:   Subclinical hypothyroidism   Anemia due to chronic kidney disease   Diabetes mellitus type II, non insulin dependent (HCC)   ESRD on hemodialysis (HCC)   Pleural effusion   Elevated troponin   Leg pain   Leukocytosis   History of bacteremia   Lumbar/sacral osteomyelitis/discitis, L5-S1 Septic arthritis L5-S1, POA Bilateral psoas abscess, POA Patient presents with acute worsening lumbar back pain with radiation down both legs to the point where he is unable to walk.  Patient with significant tenderness palpation of the lumbar spine. Recently completed 6-week course of antibiotics per infectious disease, on 8/2 for MSSA bacteremia.  WBC elevated 15.1, ESR 54 and CRP 22.1 on arrival.  MR L-spine without contrast 8/5 notable for L5-S1 discitis with osteomyelitis with left psoas abscess measuring 4.7 centimeters; no epidural collection identified.  CT pelvis  with IV contrast 8/6 with discitis/osteomyelitis L5-S1 with bilateral psoas abscesses measuring up to 46 mm in greatest dimension on the left with extensive paravertebral soft tissue inflammatory change with mild edema in the presacral space with septic arthritis  L5-S1. --Neurosurgery/IR/ID following, appreciate assistance --WBC 15.1>12.3>11.8>12.3>12.9 --Blood cultures x2 8/4: No growth x 2 days --Oxycodone as needed for pain --Cefazolin 1g IV q24h --Awaiting IR drainage/aspiration of psoas abscess --CBC daily   Left face contusion: Acute.   Patient was noted to have left facial and periorbital soft tissue contusion on CT imaging.  It appears he had initially been given ciprofloxacin and Rocephin IV for concern for periorbital cellulitis with concern for bacterial conjunctivitis.  However, on physical exam patient without significant erythema pain at this time. --Continue supportive care for now   Elevated troponin: Acute.   Patient denies any complaints of chest pain.  High-sensitivity troponin 84 -101.  EKG without significant ischemic changes.  Suspect secondary to demand. -Continue to monitor on telemetry   Right-sided pleural effusion:  Patient noted to have cardiomegaly with vascular congestion and small left-sided pleural effusion on imaging.  Patient did not appear to be grossly fluid overloaded on physical exam.  Oxygenating well on room air.  Continue volume management with HD.   ESRD on HD:  Patient had missed hemodialysis yesterday due to having significant pain, but labs appear to be relatively stable. --Continue hemodialysis per nephrology   Diabetes mellitus type 2:  Patient well controlled not on any medications for treatment.  Last hemoglobin A1c was 5.1 on 01/16/2021. --Continue renal and carb modified diet   History of MSSA bacteremia:  Patient had been treated and completed his 6 weeks of antibiotics from his previous hospitalization in June.  It appears he had no signs of endocarditis on TEE. --On cefazolin as above   Anemia chronic kidney disease:  Hemoglobin 10 g/dL which appears improved from previous, but baseline appears to be 8 g/dL. --Continue to monitor   Subclinical hypothyroidism:  TSH 6.154, free T4 normal  at 1.01.  Weakness/deconditioning/gait disturbance: --PT/OT following, currently recommending CIR on discharge   DVT prophylaxis: heparin injection 5,000 Units Start: 03/15/21 1400   Code Status: Full Code Family Communication: No family present at bedside this morning.  Disposition Plan:  Level of care: Telemetry Medical Status is: Inpatient  Remains inpatient appropriate because:Ongoing diagnostic testing needed not appropriate for outpatient work up, Unsafe d/c plan, IV treatments appropriate due to intensity of illness or inability to take PO, and Inpatient level of care appropriate due to severity of illness  Dispo: The patient is from: Home              Anticipated d/c is to: Home              Patient currently is not medically stable to d/c.   Difficult to place patient No   Consultants:  Neurosurgery Nephrology Infectious disease Interventional radiology  Procedures:  None  Antimicrobials:  Ceftriaxone 8/4 - 8/6 Cefazolin 8/6>>   Subjective: Patient seen examined at bedside, resting comfortably.  Sleeping but easily arousable.  Continues with low back pain and generalized weakness.  Awaiting IR for drainage/aspiration of psoas abscess.   No other questions or concerns at this time. Denies headache, no visual changes, no chest pain, palpitations, no shortness of breath, no abdominal pain, no weakness, no fatigue, no paresthesias.  No acute events overnight per nurse staff.  Objective: Vitals:   03/17/21 1615 03/17/21 2000 03/18/21 0409 03/18/21  0743  BP: 134/64 139/66 140/61 (!) 132/58  Pulse: 66 63 69 66  Resp: 18     Temp: 98.8 F (37.1 C) 98.4 F (36.9 C) 98 F (36.7 C)   TempSrc: Oral Oral Oral   SpO2: 92% 94% 99%   Weight:      Height:        Intake/Output Summary (Last 24 hours) at 03/18/2021 1136 Last data filed at 03/18/2021 0400 Gross per 24 hour  Intake 220 ml  Output --  Net 220 ml   Filed Weights   03/14/21 2254 03/16/21 0815  Weight: 69  kg 63.2 kg    Examination:  General exam: Appears calm and comfortable, poor dentition Respiratory system: Clear to auscultation. Respiratory effort normal. Cardiovascular system: S1 & S2 heard, RRR. No JVD, murmurs, rubs, gallops or clicks. No pedal edema. Gastrointestinal system: Abdomen is nondistended, soft and nontender. No organomegaly or masses felt. Normal bowel sounds heard. Central nervous system: Alert and oriented. No focal neurological deficits. Extremities: Symmetric 5 x 5 power.  Neurovascular intact, sensation to light touch intact. Skin: No rashes, lesions or ulcers Psychiatry: Judgement and insight appear normal. Mood & affect appropriate.     Data Reviewed: I have personally reviewed following labs and imaging studies  CBC: Recent Labs  Lab 03/14/21 2259 03/16/21 0049 03/16/21 0832 03/17/21 0219 03/18/21 0349  WBC 15.1* 12.3* 11.8* 12.3* 12.9*  NEUTROABS 12.8*  --   --   --   --   HGB 10.0* 9.4* 9.5* 10.3* 10.6*  HCT 32.0* 29.9* 30.8* 34.0* 35.5*  MCV 95.8 97.1 98.1 98.0 99.2  PLT 212 193 177 193 885   Basic Metabolic Panel: Recent Labs  Lab 03/14/21 2259 03/15/21 1150 03/16/21 0049 03/17/21 0219 03/18/21 0349  NA 139 142 136 134* 133*  K 4.4 4.0 4.1 3.7 4.5  CL 94* 95* 91* 94* 92*  CO2 _0 GLUCOSE 173* 95 181* 178* 157*  BUN 63* 70* 78* 34* 50*  CREATININE 8.50* 9.60* 10.32* 5.94* 7.79*  CALCIUM 7.9* 7.6* 7.4* 7.7* 8.2*  PHOS  --  9.0* 9.0* 4.0 4.2   GFR: Estimated Creatinine Clearance: 7.2 mL/min (A) (by C-G formula based on SCr of 7.79 mg/dL (H)). Liver Function Tests: Recent Labs  Lab 03/14/21 2259 03/15/21 1150 03/16/21 0049 03/17/21 0219 03/18/21 0349  AST 21  --   --   --   --   ALT 9  --   --   --   --   ALKPHOS 107  --   --   --   --   BILITOT 1.2  --   --   --   --   PROT 7.1  --   --   --   --   ALBUMIN 3.4* 2.4* 2.5* 2.5* 2.4*   No results for input(s): LIPASE, AMYLASE in the last 168 hours. No results for  input(s): AMMONIA in the last 168 hours. Coagulation Profile: No results for input(s): INR, PROTIME in the last 168 hours. Cardiac Enzymes: No results for input(s): CKTOTAL, CKMB, CKMBINDEX, TROPONINI in the last 168 hours. BNP (last 3 results) No results for input(s): PROBNP in the last 8760 hours. HbA1C: No results for input(s): HGBA1C in the last 72 hours. CBG: No results for input(s): GLUCAP in the last 168 hours. Lipid Profile: No results for input(s): CHOL, HDL, LDLCALC, TRIG, CHOLHDL, LDLDIRECT in the last 72 hours. Thyroid Function Tests: Recent Labs    03/16/21  0049  TSH 6.154*  FREET4 1.01   Anemia Panel: No results for input(s): VITAMINB12, FOLATE, FERRITIN, TIBC, IRON, RETICCTPCT in the last 72 hours. Sepsis Labs: No results for input(s): PROCALCITON, LATICACIDVEN in the last 168 hours.  Recent Results (from the past 240 hour(s))  Resp Panel by RT-PCR (Flu A&B, Covid) Nasopharyngeal Swab     Status: None   Collection Time: 03/15/21  2:08 AM   Specimen: Nasopharyngeal Swab; Nasopharyngeal(NP) swabs in vial transport medium  Result Value Ref Range Status   SARS Coronavirus 2 by RT PCR NEGATIVE NEGATIVE Final    Comment: (NOTE) SARS-CoV-2 target nucleic acids are NOT DETECTED.  The SARS-CoV-2 RNA is generally detectable in upper respiratory specimens during the acute phase of infection. The lowest concentration of SARS-CoV-2 viral copies this assay can detect is 138 copies/mL. A negative result does not preclude SARS-Cov-2 infection and should not be used as the sole basis for treatment or other patient management decisions. A negative result may occur with  improper specimen collection/handling, submission of specimen other than nasopharyngeal swab, presence of viral mutation(s) within the areas targeted by this assay, and inadequate number of viral copies(<138 copies/mL). A negative result must be combined with clinical observations, patient history, and  epidemiological information. The expected result is Negative.  Fact Sheet for Patients:  EntrepreneurPulse.com.au  Fact Sheet for Healthcare Providers:  IncredibleEmployment.be  This test is no t yet approved or cleared by the [place] FDA and  has been authorized for detection and/or diagnosis of SARS-CoV-2 by FDA under an Emergency Use Authorization (EUA). This EUA will remain  in effect (meaning this test can be used) for the duration of the COVID-19 declaration under Section 564(b)(1) of the Act, 21 U.S.C.section 360bbb-3(b)(1), unless the authorization is terminated  or revoked sooner.       Influenza A by PCR NEGATIVE NEGATIVE Final   Influenza B by PCR NEGATIVE NEGATIVE Final    Comment: (NOTE) The Xpert Xpress SARS-CoV-2/FLU/RSV plus assay is intended as an aid in the diagnosis of influenza from Nasopharyngeal swab specimens and should not be used as a sole basis for treatment. Nasal washings and aspirates are unacceptable for Xpert Xpress SARS-CoV-2/FLU/RSV testing.  Fact Sheet for Patients: EntrepreneurPulse.com.au  Fact Sheet for Healthcare Providers: IncredibleEmployment.be  This test is not yet approved or cleared by the [place] FDA and has been authorized for detection and/or diagnosis of SARS-CoV-2 by FDA under an Emergency Use Authorization (EUA). This EUA will remain in effect (meaning this test can be used) for the duration of the COVID-19 declaration under Section 564(b)(1) of the Act, 21 U.S.C. section 360bbb-3(b)(1), unless the authorization is terminated or revoked.  Performed at [facility], [address], [place], [place] [address]   Culture, blood (routine x 2)     Status: None (Preliminary result)   Collection Time: 03/15/21  3:39 PM   Specimen: BLOOD  Result Value Ref Range Status   Specimen Description BLOOD RIGHT ANTECUBITAL  Final    Special Requests   Final    BOTTLES DRAWN AEROBIC AND ANAEROBIC Blood Culture results may not be optimal due to an inadequate volume of blood received in culture bottles   Culture   Final    NO GROWTH 3 DAYS Performed at [facility], [place] [address] [place], [place] [address]    Report Status PENDING  Incomplete  Culture, blood (routine x 2)     Status: None (Preliminary result)   Collection Time: 03/15/21  3:47 PM   Specimen: BLOOD RIGHT HAND  Result Value Ref Range Status   Specimen Description BLOOD RIGHT HAND  Final   Special Requests   Final    BOTTLES DRAWN AEROBIC AND ANAEROBIC Blood Culture adequate volume   Culture   Final    NO GROWTH 3 DAYS Performed at [facility], [address] [place],  [address]    Report Status PENDING  Incomplete         Radiology Studies: CT PELVIS W CONTRAST  Result Date: 03/17/2021 CLINICAL DATA:  Abscess, anal or rectal psoas abcess EXAM: CT PELVIS WITH CONTRAST TECHNIQUE: Multidetector CT imaging of the pelvis was performed using the standard protocol following the bolus administration of intravenous contrast. CONTRAST:  169m OMNIPAQUE IOHEXOL 300 MG/ML  SOLN COMPARISON:  CT abdomen pelvis 01/15/2021, MRI lumbar spine 03/16/2021 FINDINGS: Urinary Tract:  No abnormality visualized. Bowel: The visualized bowel loops are unremarkable. The visualized appendix is normal. There is moderate presacral edema likely related to the inflammatory process at L5-S1. No discrete drainable fluid collection within this region. Vascular/Lymphatic: The abdominal vasculature is unremarkable. No pathologic adenopathy within the pelvis. Reproductive:  Prostate gland is unremarkable. Other: Left psoas abscess is again identified measuring 21 x 23 x 46 mm in greatest dimension. Smaller 12 mm right psoas abscesses are present adjacent to L5-S1. Extensive paravertebral inflammatory stranding and soft tissue infiltration is seen at L5-S1 in keeping with  changes of discitis osteomyelitis at this location. Associated endplate erosion is noted at this level. Small posterior collection abuts the thecal sac at this level, unchanged from prior exam. Musculoskeletal: Aside from inflammatory changes related to L5-S1 discitis osteomyelitis, there is again noted widening of the left L5-S1 facet with cortical erosion in keeping with septic arthritis in this location. No other bone abnormality is identified. IMPRESSION: Discitis/osteomyelitis L5-S1 with bilateral psoas abscesses measuring up to 46 mm in greatest dimension on the left. Extensive paravertebral soft tissue inflammatory change with mild edema extending into the presacral space. Septic arthritis involving the left L5-S1 facet joint. Electronically Signed   By: AFidela SalisburyMD   On: 03/17/2021 15:52   MR LUMBAR SPINE WO CONTRAST  Result Date: 03/16/2021 CLINICAL DATA:  Low back pain, > 6 wks; Low back pain, infection suspected. EXAM: MRI LUMBAR SPINE WITHOUT CONTRAST TECHNIQUE: Multiplanar, multisequence MR imaging of the lumbar spine was performed. No intravenous contrast was administered. COMPARISON:  MRI of the lumbar spine January 17, 2021. FINDINGS: Segmentation:  Standard. Alignment: Trace anterolisthesis of L4 over L5. Straightening of the lumbar lordosis Vertebrae: Interval development of prominent endplate erosion and edema at L5-S1 with fluid signal within the corresponding intervertebral disc, consistent with discitis/osteomyelitis. There is perivertebral soft tissue edema with a fluid collection within the left psoas muscle measuring approximately 4.7 x 2.5 x 2.1 cm, consistent with abscess. No definitive evidence of epidural fluid collection. Persistent marrow edema at the left L5-S1 facet joint, may also be related to septic arthritis. Conus medullaris and cauda equina: Conus extends to the L2 level. Conus and cauda equina appear normal. Paraspinal and other soft tissues: Left psoas abscess, as  described above. Disc levels: T12-L1: No spinal canal or neural foraminal stenosis. L1-2: Shallow disc bulge and mild facet degenerative changes without significant spinal canal or neural foraminal stenosis. L2-3: Mild facet degenerative changes without significant spinal canal or neural foraminal stenosis. L3-4: Shallow disc bulge, mild right and moderate left facet degenerative changes resulting in mild-to-moderate left neural foraminal  narrowing. L4-5: Disc bulge and advanced hypertrophic facet degenerative changes resulting in mild spinal canal stenosis and moderate bilateral neural foraminal narrowing, unchanged. L5-S1: Disc bulge, moderate right and advanced left facet degenerative changes resulting in narrowing of the bilateral subarticular zones and severe bilateral neural foraminal narrowing. IMPRESSION: 1. Findings consistent with L5-S1 discitis/osteomyelitis with associated left psoas abscess measuring up to 4.7 cm. No epidural collection identified. 2. Persistent marrow and periarticular soft tissue edema at the left L5-S1 facet joint, may be related to septic arthritis. 3. Moderate bilateral neural foraminal narrowing at L4-5 and severe bilateral neural foraminal narrowing at L5-S1. 4. No high-grade spinal canal stenosis. Electronically Signed   By: Pedro Earls M.D.   On: 03/16/2021 16:29        Scheduled Meds:  Chlorhexidine Gluconate Cloth  6 each Topical Q0600   darbepoetin (ARANESP) injection - DIALYSIS  100 mcg Intravenous Q Fri-HD   doxercalciferol  3 mcg Intravenous Q M,W,F-HD   feeding supplement (NEPRO CARB STEADY)  237 mL Oral BID BM   gabapentin  100 mg Oral QHS   heparin  5,000 Units Subcutaneous Q8H   midodrine  10 mg Oral TID WC   multivitamin  1 tablet Oral QHS   sodium chloride flush  3 mL Intravenous Q12H   sucroferric oxyhydroxide  1,000 mg Oral TID WC   Continuous Infusions:   ceFAZolin (ANCEF) IV     iron sucrose 100 mg (03/16/21 1036)      LOS: 3 days    Time spent: 39 minutes spent on chart review, discussion with nursing staff, consultants, updating family and interview/physical exam; more than 50% of that time was spent in counseling and/or coordination of care.    Chi Woodham J [place] DO [facility] Available via Epic secure chat 7am-7pm After these hours, please refer to coverage provider listed on [email or web address] 03/18/2021, 11:36 AM

## 2021-03-18 NOTE — Progress Notes (Signed)
Utilized interpreter services Verdis Frederickson [phone]) to assess,educate, and speak with patient. Patient has no complaints at this time and is alert and oriented. Patient does state he was having some hallucinations earlier [place]. Educated patient on narcotics and narcotic side effects. Patient denies pain at this time and was educated on night time medications.

## 2021-03-18 NOTE — H&P (Signed)
Chief Complaint: Patient was seen in consultation today for image guided aspiration and possible drain placement for left psoas fluid collection at the request of Palumbo,April  Referring Physician(s): Palumbo,April  Supervising Physician: Ruthann Cancer  Patient Status: [facility] - In-pt  History of Present Illness: Ryan Wells is a 65 y.o. male with medical history significant of ESRD on HD, hypertension, diabetes mellitus type 2, anemia of chronic disease, and MSSA bacteremia who presented to ED due to back and leg pain, underwent MR lumbar spine without contrast which showed L5-S1 discitis/osteomyelitis with associated left psoas abscess.   IR was requested for image guided aspiration and possible drain placement for the left psoas abscess. Case was reviewed by Dr. Serafina Royals who recommended CT pelvis with contrast for better visualization of the psoas abscess.  CT pelvis with contrast was reviewed with Dr. Serafina Royals today, procedure is approved for a CT-guided aspiration and possible drain placement of the left psoas abscess.  Patient was evaluated the bedside. Patient's preferred language Spanish, interpreter was used. Patient laying in bed, not in acute distress.  [name] headache, fever, chills, shortness of breath, cough, chest pain, abdominal pain, nausea ,vomiting, and bleeding.   Past Medical History:  Diagnosis Date   3rd nerve palsy, partial, right 01/03/2017   AAA (abdominal aortic aneurysm) [facility])    Not noted on CT abd 2019   Chest pain 11/2013   Normal Echo/ EKG/enzymes-hospitalized.  Did not get outpatient stress testing following hospitalization.  No chest pain since   Chronic kidney disease    on dialysis Tues, Thurs and Sat   Diabetes mellitus without complication ([place])    Diabetes type 2, uncontrolled ([place]) 11/25/2011   no meds, diet controlled per patient   Diabetic gastroparesis ([place]) 01/22/2017   Hypertension    no meds   Microalbuminuria 01/19/2017    Myocardial infarction [facility]) 2015    Past Surgical History:  Procedure Laterality Date   A/V FISTULAGRAM Left 06/04/2019   Procedure: A/V FISTULAGRAM;  Surgeon: Angelia Mould, MD;  Location: [place] CV [facility];  Service: Cardiovascular;  Laterality: Left;   A/V FISTULAGRAM Left 10/15/2019   Procedure: A/V FISTULAGRAM;  Surgeon: Angelia Mould, MD;  Location: [place] [facility];  Service: Cardiovascular;  Laterality: Left;   A/V FISTULAGRAM N/A 01/24/2021   Procedure: A/V FISTULAGRAM - Left Upper;  Surgeon: Cherre Robins, MD;  Location: [place] [facility];  Service: Cardiovascular;  Laterality: N/A;   AV FISTULA PLACEMENT Left 07/02/2018   Procedure: BRACHIOCEPHALIC ARTERIOVENOUS (AV) FISTULA CREATION LEFT ARM;  Surgeon: Serafina Mitchell, MD;  Location: [place];  Service: Vascular;  Laterality: Left;   [place] Left 07/26/2019   Procedure: Bascilic Vein Transposition;  Surgeon: Angelia Mould, MD;  Location: [place];  Service: Vascular;  Laterality: Left;   COLONOSCOPY     EMBOLIZATION Left 06/04/2019   Procedure: EMBOLIZATION;  Surgeon: Angelia Mould, MD;  Location: [place] [facility];  Service: Cardiovascular;  Laterality: Left;  LT ARM FISTULA/COMPETING BRANCH   EYE SURGERY Bilateral    cataracts x2   EYE SURGERY     INSERTION OF DIALYSIS CATHETER Right 06/08/2019   Procedure: INSERTION OF PALINDROME DIALYSIS CATHETER IN RIGHT INTERNAL JUGULAR;  Surgeon: Angelia Mould, MD;  Location: [place];  Service: Vascular;  Laterality: Right;   LIGATION OF ARTERIOVENOUS  FISTULA Left 07/26/2019   Procedure: Ligation Of BrachioCephalic  Fistula;  Surgeon: Angelia Mould, MD;  Location: [place];  Service: Vascular;  Laterality: Left;   NECK SURGERY     PERIPHERAL VASCULAR BALLOON ANGIOPLASTY Left 06/04/2019   Procedure: PERIPHERAL VASCULAR BALLOON ANGIOPLASTY;  Surgeon: Angelia Mould, MD;  Location: [place] [facility];  Service:  Cardiovascular;  Laterality: Left;  ARM FISTULA   PERIPHERAL VASCULAR BALLOON ANGIOPLASTY Left 10/15/2019   Procedure: PERIPHERAL VASCULAR BALLOON ANGIOPLASTY;  Surgeon: Angelia Mould, MD;  Location: [place] [facility];  Service: Cardiovascular;  Laterality: Left;  arm fistula   TEE WITHOUT CARDIOVERSION N/A 01/19/2021   Procedure: TRANSESOPHAGEAL ECHOCARDIOGRAM (TEE);  Surgeon: Jerline Pain, MD;  Location: [facility] ENDOSCOPY;  Service: Cardiovascular;  Laterality: N/A;    Allergies: Patient has no known allergies.  Medications: Prior to Admission medications   Medication Sig Start Date End Date Taking? Authorizing Provider  oxyCODONE-acetaminophen (PERCOCET/ROXICET) 5-325 MG tablet Take 1 tablet by mouth every 6 (six) hours as needed for severe pain. 03/06/21  Yes Lajean Saver, MD  ceFAZolin (ANCEF) 2-4 GM/100ML-% IVPB Inject 100 mLs (2 g total) into the vein every Monday, Wednesday, and Friday at 6 PM. 01/30/21   Riesa Pope, MD  Darbepoetin Alfa (ARANESP) 60 MCG/0.3ML SOSY injection Inject 0.3 mLs (60 mcg total) into the vein every Friday with hemodialysis. 02/02/21   Riesa Pope, MD  doxercalciferol (HECTOROL) 4 MCG/2ML injection Inject 2 mLs (4 mcg total) into the vein every Monday, Wednesday, and Friday with hemodialysis. 01/30/21   Katsadouros, Vasilios, MD  midodrine (PROAMATINE) 10 MG tablet Take 1 tablet (10 mg total) by mouth 3 (three) times daily with meals. 01/30/21   Katsadouros, Vasilios, MD  pentafluoroprop-tetrafluoroeth Landry Dyke) AERO Apply 1 application topically as needed (topical anesthesia for hemodialysis). 01/30/21   Riesa Pope, MD  predniSONE (DELTASONE) 20 MG tablet 3 po once a day for 3 days, then 2 po once a day for 3 days, then 1 po once a day for 3 days 03/07/21   Lajean Saver, MD  VELPHORO 500 MG chewable tablet Chew 1,000 mg by mouth 3 (three) times daily with meals. 08/18/19   [provider]     Family History  Problem  Relation Age of Onset   Heart disease Mother        cause of death--CHF?   Diabetes Father    Kidney disease Father        Dialysis for kidney failure   Diabetes Sister    Diabetes Brother    Colon cancer Neg Hx    Stomach cancer Neg Hx    Esophageal cancer Neg Hx     Social History   Socioeconomic History   Marital status: Married    Spouse name: Mariana Arn   Number of children: 3   Years of education: 1 year university   Highest education level: Not on file  Occupational History   Occupation: unemployed  Tobacco Use   Smoking status: Former   Smokeless tobacco: Never  Scientific laboratory technician Use: Never used  Substance and Sexual Activity   Alcohol use: Not Currently   Drug use: Never   Sexual activity: Yes  Other Topics Concern   Not on file  Social History Narrative   ** Merged History Encounter **       Originally from [place] Came to [employer]. In 1985 Lives in [place] neighborhood with wife and 3 sons.   Sons are 28-30 yo Some sons help with support   Social Determinants of Health   Financial Resource Strain: Not on file  Food Insecurity: Not on file  Transportation Needs: Not on file  Physical Activity: Not on file  Stress: Not on file  Social Connections: Not on file     Review of Systems: A 12 point ROS discussed and pertinent positives are indicated in the HPI above.  All other systems are negative.   Vital Signs: BP (!) 132/58   Pulse 66   Temp 98 F (36.7 C) (Oral)   Resp 18   Ht '5\' 2"'$  (1.575 m)   Wt 139 lb 5.3 oz (63.2 kg)   SpO2 99%   BMI 25.48 kg/m   Physical Exam Vitals reviewed.  Constitutional:      General: He is not in acute distress.    Appearance: He is ill-appearing.  HENT:     Head: Normocephalic and atraumatic.     Mouth/Throat:     Mouth: Mucous membranes are moist.     Comments: Missing multiple teeth Cardiovascular:     Rate and Rhythm: Normal rate and regular rhythm.     Pulses: Normal pulses.      Heart sounds: Normal heart sounds.  Pulmonary:     Effort: Pulmonary effort is normal.     Breath sounds: Normal breath sounds.  Abdominal:     General: Abdomen is flat. Bowel sounds are normal.     Palpations: Abdomen is soft.  Musculoskeletal:     Cervical back: Neck supple.  Skin:    General: Skin is warm and dry.     Coloration: Skin is not jaundiced or pale.  Neurological:     Mental Status: He is alert and oriented to person, place, and time.  Psychiatric:        Mood and Affect: Mood normal.        Behavior: Behavior normal.        Judgment: Judgment normal.    MD Evaluation Airway: WNL Heart: WNL Abdomen: WNL Chest/ Lungs: WNL ASA  Classification: 3 Mallampati/Airway Score: Two  Imaging: DG Chest 1 View  Result Date: 03/15/2021 CLINICAL DATA:  65 year old male with generalized pain. EXAM: CHEST  1 VIEW COMPARISON:  Chest radiograph dated 01/28/2021. FINDINGS: Cardiomegaly with vascular congestion. Focal area of subpleural nodularity along the lateral right mid lung field likely corresponds to the previously seen cavitary lesion. Overall the previously seen nodules are less conspicuous on today's study. There is a small right pleural effusion or pleural thickening. No pneumothorax. No acute osseous pathology. Degenerative changes of the spine. Partially visualized cervical ACDF and left axillary vascular stent. No acute osseous pathology. IMPRESSION: 1. Cardiomegaly with vascular congestion. 2. Focal area of subpleural nodularity along the right mid lung field likely corresponds to the previously seen cavitary lesion. 3. Small right pleural effusion. Electronically Signed   By: Anner Crete M.D.   On: 03/15/2021 00:05   DG Lumbar Spine Complete  Result Date: 03/05/2021 CLINICAL DATA:  Fall, back pain for 8 months EXAM: LUMBAR SPINE - COMPLETE 4+ VIEW COMPARISON:  MR lumbar spine, 01/17/2021 FINDINGS: There is no evidence of lumbar spine fracture. Alignment is normal. Mild  disc space height loss and osteophytosis at L1-L2 and L5-S1 with otherwise intact disc spaces and minimal endplate osteophytosis. Stool and stool balls throughout the colon and rectum. IMPRESSION: 1.  No fracture or dislocation of the lumbar spine. 2. Mild disc space height loss and osteophytosis at L1-L2 and L5-S1, similar to prior MR. Electronically Signed   By: Eddie Candle M.D.   On: 03/05/2021 21:34  CT HEAD WO CONTRAST (5MM)  Result Date: 03/15/2021 CLINICAL DATA:  65 year old male with ocular pain and swelling over the left orbit. Facial trauma. EXAM: CT HEAD AND ORBITS WITHOUT CONTRAST TECHNIQUE: Contiguous axial images were obtained from the base of the skull through the vertex without contrast. Multidetector CT imaging of the orbits was performed using the standard protocol without intravenous contrast. COMPARISON:  None FINDINGS: CT HEAD FINDINGS Brain: There is mild age-related atrophy and chronic microvascular ischemic changes. There is no acute intracranial hemorrhage. No mass effect or midline shift. No extra-axial fluid collection. Vascular: No hyperdense vessel or unexpected calcification. Skull: Normal. Negative for fracture or focal lesion. Other: None CT ORBITS FINDINGS Evaluation of this exam is limited in the absence of intravenous contrast. Orbits: The globes and retro-orbital fat are preserved. No acute or traumatic injury. Visualized sinuses: There is mild diffuse mucoperiosteal thickening of paranasal sinuses. No air-fluid level. Mild bilateral mastoid effusions. Soft tissues: Left facial and periorbital soft tissue contusion. No large hematoma. IMPRESSION: 1. No acute intracranial pathology. 2. No acute/traumatic orbital pathology. 3. Left facial and periorbital soft tissue contusion. Electronically Signed   By: Anner Crete M.D.   On: 03/15/2021 00:34   CT PELVIS W CONTRAST  Result Date: 03/17/2021 CLINICAL DATA:  Abscess, anal or rectal psoas abcess EXAM: CT PELVIS WITH  CONTRAST TECHNIQUE: Multidetector CT imaging of the pelvis was performed using the standard protocol following the bolus administration of intravenous contrast. CONTRAST:  18m OMNIPAQUE IOHEXOL 300 MG/ML  SOLN COMPARISON:  CT abdomen pelvis 01/15/2021, MRI lumbar spine 03/16/2021 FINDINGS: Urinary Tract:  No abnormality visualized. Bowel: The visualized bowel loops are unremarkable. The visualized appendix is normal. There is moderate presacral edema likely related to the inflammatory process at L5-S1. No discrete drainable fluid collection within this region. Vascular/Lymphatic: The abdominal vasculature is unremarkable. No pathologic adenopathy within the pelvis. Reproductive:  Prostate gland is unremarkable. Other: Left psoas abscess is again identified measuring 21 x 23 x 46 mm in greatest dimension. Smaller 12 mm right psoas abscesses are present adjacent to L5-S1. Extensive paravertebral inflammatory stranding and soft tissue infiltration is seen at L5-S1 in keeping with changes of discitis osteomyelitis at this location. Associated endplate erosion is noted at this level. Small posterior collection abuts the thecal sac at this level, unchanged from prior exam. Musculoskeletal: Aside from inflammatory changes related to L5-S1 discitis osteomyelitis, there is again noted widening of the left L5-S1 facet with cortical erosion in keeping with septic arthritis in this location. No other bone abnormality is identified. IMPRESSION: Discitis/osteomyelitis L5-S1 with bilateral psoas abscesses measuring up to 46 mm in greatest dimension on the left. Extensive paravertebral soft tissue inflammatory change with mild edema extending into the presacral space. Septic arthritis involving the left L5-S1 facet joint. Electronically Signed   By: AFidela SalisburyMD   On: 03/17/2021 15:52   MR LUMBAR SPINE WO CONTRAST  Result Date: 03/16/2021 CLINICAL DATA:  Low back pain, > 6 wks; Low back pain, infection suspected. EXAM:  MRI LUMBAR SPINE WITHOUT CONTRAST TECHNIQUE: Multiplanar, multisequence MR imaging of the lumbar spine was performed. No intravenous contrast was administered. COMPARISON:  MRI of the lumbar spine January 17, 2021. FINDINGS: Segmentation:  Standard. Alignment: Trace anterolisthesis of L4 over L5. Straightening of the lumbar lordosis Vertebrae: Interval development of prominent endplate erosion and edema at L5-S1 with fluid signal within the corresponding intervertebral disc, consistent with discitis/osteomyelitis. There is perivertebral soft tissue edema with a fluid collection within the left  psoas muscle measuring approximately 4.7 x 2.5 x 2.1 cm, consistent with abscess. No definitive evidence of epidural fluid collection. Persistent marrow edema at the left L5-S1 facet joint, may also be related to septic arthritis. Conus medullaris and cauda equina: Conus extends to the L2 level. Conus and cauda equina appear normal. Paraspinal and other soft tissues: Left psoas abscess, as described above. Disc levels: T12-L1: No spinal canal or neural foraminal stenosis. L1-2: Shallow disc bulge and mild facet degenerative changes without significant spinal canal or neural foraminal stenosis. L2-3: Mild facet degenerative changes without significant spinal canal or neural foraminal stenosis. L3-4: Shallow disc bulge, mild right and moderate left facet degenerative changes resulting in mild-to-moderate left neural foraminal narrowing. L4-5: Disc bulge and advanced hypertrophic facet degenerative changes resulting in mild spinal canal stenosis and moderate bilateral neural foraminal narrowing, unchanged. L5-S1: Disc bulge, moderate right and advanced left facet degenerative changes resulting in narrowing of the bilateral subarticular zones and severe bilateral neural foraminal narrowing. IMPRESSION: 1. Findings consistent with L5-S1 discitis/osteomyelitis with associated left psoas abscess measuring up to 4.7 cm. No epidural  collection identified. 2. Persistent marrow and periarticular soft tissue edema at the left L5-S1 facet joint, may be related to septic arthritis. 3. Moderate bilateral neural foraminal narrowing at L4-5 and severe bilateral neural foraminal narrowing at L5-S1. 4. No high-grade spinal canal stenosis. Electronically Signed   By: Pedro Earls M.D.   On: 03/16/2021 16:29   DG Hip Unilat With Pelvis 2-3 Views Left  Result Date: 03/05/2021 CLINICAL DATA:  65 year old male with fall and left hip pain EXAM: DG HIP (WITH OR WITHOUT PELVIS) 2-3V LEFT COMPARISON:  None. FINDINGS: There is no acute fracture or dislocation. Mild osteopenia. There is mild arthritic changes of the hips and degenerative changes of the lower lumbar spine. The soft tissues are unremarkable. IMPRESSION: No acute fracture or dislocation. Electronically Signed   By: Anner Crete M.D.   On: 03/05/2021 21:29   CT Orbits Wo Contrast  Result Date: 03/15/2021 CLINICAL DATA:  65 year old male with ocular pain and swelling over the left orbit. Facial trauma. EXAM: CT HEAD AND ORBITS WITHOUT CONTRAST TECHNIQUE: Contiguous axial images were obtained from the base of the skull through the vertex without contrast. Multidetector CT imaging of the orbits was performed using the standard protocol without intravenous contrast. COMPARISON:  None FINDINGS: CT HEAD FINDINGS Brain: There is mild age-related atrophy and chronic microvascular ischemic changes. There is no acute intracranial hemorrhage. No mass effect or midline shift. No extra-axial fluid collection. Vascular: No hyperdense vessel or unexpected calcification. Skull: Normal. Negative for fracture or focal lesion. Other: None CT ORBITS FINDINGS Evaluation of this exam is limited in the absence of intravenous contrast. Orbits: The globes and retro-orbital fat are preserved. No acute or traumatic injury. Visualized sinuses: There is mild diffuse mucoperiosteal thickening of  paranasal sinuses. No air-fluid level. Mild bilateral mastoid effusions. Soft tissues: Left facial and periorbital soft tissue contusion. No large hematoma. IMPRESSION: 1. No acute intracranial pathology. 2. No acute/traumatic orbital pathology. 3. Left facial and periorbital soft tissue contusion. Electronically Signed   By: Anner Crete M.D.   On: 03/15/2021 00:34    Labs:  CBC: Recent Labs    03/16/21 0049 03/16/21 0832 03/17/21 0219 03/18/21 0349  WBC 12.3* 11.8* 12.3* 12.9*  HGB 9.4* 9.5* 10.3* 10.6*  HCT 29.9* 30.8* 34.0* 35.5*  PLT 193 177 193 209    COAGS: No results for input(s): INR, APTT in  the last 8760 hours.  BMP: Recent Labs    03/15/21 1150 03/16/21 0049 03/17/21 0219 03/18/21 0349  NA 142 136 134* 133*  K 4.0 4.1 3.7 4.5  CL 95* 91* 94* 92*  CO2 '27 27 27 27  '$ GLUCOSE 95 181* 178* 157*  BUN 70* 78* 34* 50*  CALCIUM 7.6* 7.4* 7.7* 8.2*  CREATININE 9.60* 10.32* 5.94* 7.79*  GFRNONAA 6* 5* 10* 7*    LIVER FUNCTION TESTS: Recent Labs    01/15/21 0612 01/15/21 2049 01/16/21 0103 03/05/21 2043 03/14/21 2259 03/15/21 1150 03/16/21 0049 03/17/21 0219 03/18/21 0349  BILITOT 1.3* 1.3*  --  1.0 1.2  --   --   --   --   AST 42* 55*  --  20 21  --   --   --   --   ALT 29 27  --  5 9  --   --   --   --   ALKPHOS 100 92  --  167* 107  --   --   --   --   PROT 8.2* 6.7  --  8.0 7.1  --   --   --   --   ALBUMIN 3.7 3.1*   < > 2.9* 3.4* 2.4* 2.5* 2.5* 2.4*   < > = values in this interval not displayed.    TUMOR MARKERS: No results for input(s): AFPTM, CEA, CA199, CHROMGRNA in the last 8760 hours.  Assessment and Plan: 65 y.o. male who presented to ED due to back pain and underwent MR lumbar spine without contrast which showed L5-S1 discitis/osteomyelitis with associated left psoas abscess.   IR was requested for image guided aspiration and possible drain placement for the left psoas abscess. Case was reviewed by Dr. Serafina Royals who recommended CT pelvis  with contrast for better visualization of the psoas abscess.  CT pelvis with contrast was reviewed with Dr. Serafina Royals today, procedure is approved for a CT-guided aspiration and possible drain placement of the left psoas abscess.  During evaluation, patient stated that he a little bit of breakfast today, unsure what time.  The procedure is tentatively scheduled for tomorrow pending IR schedule. Made n.p.o. at midnight VSS CBC with leukocytosis WBC 12.9, anemia  Hgb 10.6, PLT 209 No INR found in chart, ordered to be drawn tomorrow morning Patient on subcu heparin 5000 units every 8 hours -will need to be DC'd for 6 hours prior to the procedure.  03/19/2021 6 AM dose held.  IR will contact the floor with the time of the procedure tomorrow.  Risks and benefits discussed with the patient including bleeding, infection, damage to adjacent structures, bowel perforation/fistula connection, and sepsis.  All of the patient's questions were answered, patient is agreeable to proceed. Consent signed and in chart.   Thank you for this interesting consult.  I greatly enjoyed meeting GARDY LETIZIA and look forward to participating in their care.  A copy of this report was sent to the requesting provider on this date.  Electronically Signed: Tera Mater, PA-C 03/18/2021, 12:08 PM   I spent a total of 40 Minutes    in face to face in clinical consultation, greater than 50% of which was counseling/coordinating care for aspiration and possible drain placement for left psoas abscess.

## 2021-03-19 ENCOUNTER — Inpatient Hospital Stay (HOSPITAL_COMMUNITY): Payer: Medicare HMO

## 2021-03-19 DIAGNOSIS — M545 Low back pain, unspecified: Secondary | ICD-10-CM | POA: Diagnosis not present

## 2021-03-19 LAB — RENAL FUNCTION PANEL
Albumin: 2.4 g/dL — ABNORMAL LOW (ref 3.5–5.0)
Anion gap: 15 (ref 5–15)
BUN: 60 mg/dL — ABNORMAL HIGH (ref 8–23)
CO2: 26 mmol/L (ref 22–32)
Calcium: 8.5 mg/dL — ABNORMAL LOW (ref 8.9–10.3)
Chloride: 89 mmol/L — ABNORMAL LOW (ref 98–111)
Creatinine, Ser: 9.22 mg/dL — ABNORMAL HIGH (ref 0.61–1.24)
GFR, Estimated: 6 mL/min — ABNORMAL LOW (ref >60)
Glucose, Bld: 180 mg/dL — ABNORMAL HIGH (ref 70–99)
Phosphorus: 5 mg/dL — ABNORMAL HIGH (ref 2.5–4.6)
Potassium: 4.7 mmol/L (ref 3.5–5.1)
Sodium: 130 mmol/L — ABNORMAL LOW (ref 135–145)

## 2021-03-19 LAB — CBC
HCT: 35 % — ABNORMAL LOW (ref 39.0–52.0)
Hemoglobin: 10.5 g/dL — ABNORMAL LOW (ref 13.0–17.0)
MCH: 29.8 pg (ref 26.0–34.0)
MCHC: 30 g/dL (ref 30.0–36.0)
MCV: 99.4 fL (ref 80.0–100.0)
Platelets: 214 10*3/uL (ref 150–400)
RBC: 3.52 MIL/uL — ABNORMAL LOW (ref 4.22–5.81)
RDW: 18 % — ABNORMAL HIGH (ref 11.5–15.5)
WBC: 12 10*3/uL — ABNORMAL HIGH (ref 4.0–10.5)
nRBC: 0 % (ref 0.0–0.2)

## 2021-03-19 LAB — PROTIME-INR
INR: 1.1 (ref 0.8–1.2)
Prothrombin Time: 13.7 seconds (ref 11.4–15.2)

## 2021-03-19 IMAGING — CT CT GUIDANCE NEEDLE PLACEMENT
1 of 2 series · 15 of 32 positions shown, 19 images · non-contrast
Comparison: none

INDICATION: 65-year-old male with osteomyelitis discitis of L5-S1, and small
abscess on prior imaging referred for aspiration/possible drainage.

[Series 2: i-spiral 5.0 b40f · axial · 0.75mm/px · z∈[+739,+970]mm · 15 of 72 slices shown, 19 images]
[im 3/72  soft-tissue]
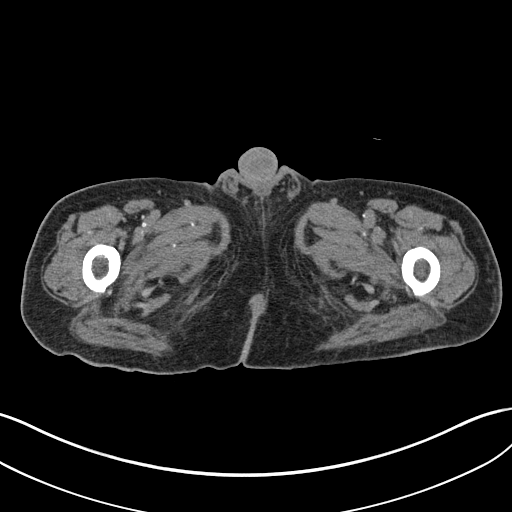
[im 3/72  bone]
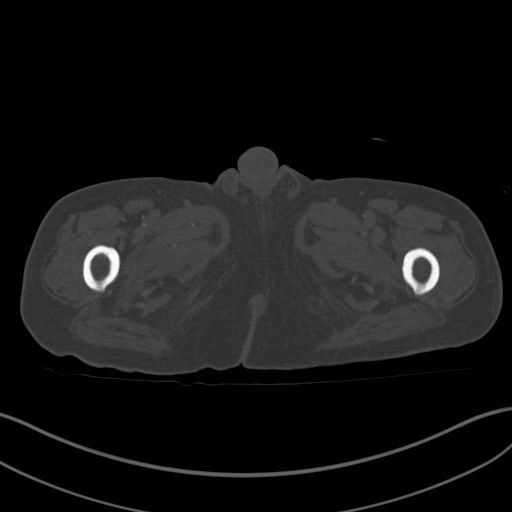
[im 9/72  soft-tissue]
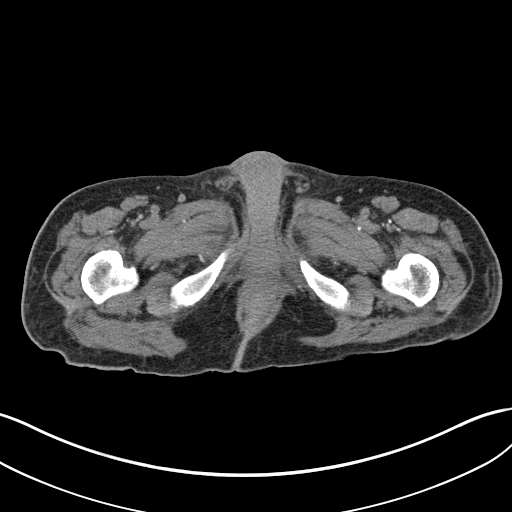
[im 14/72  soft-tissue]
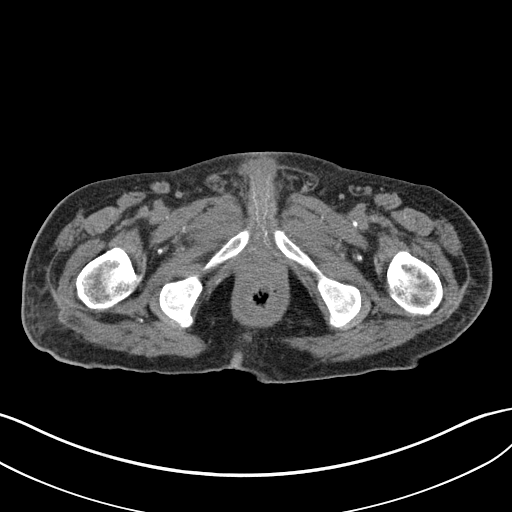
[im 20/72  soft-tissue]
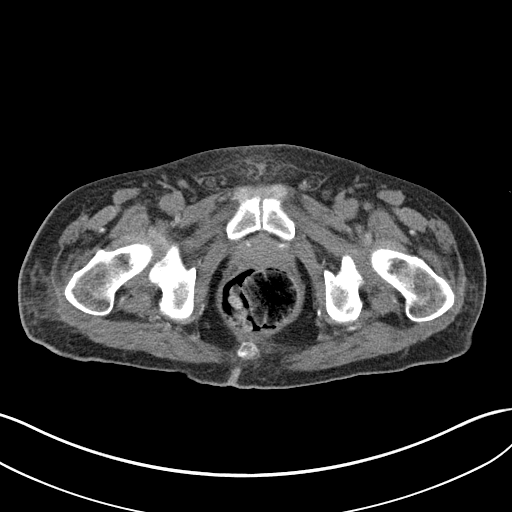
[im 25/72  soft-tissue]
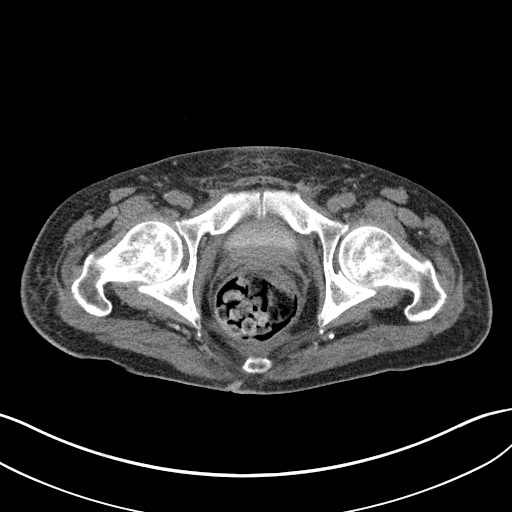
[im 31/72  soft-tissue]
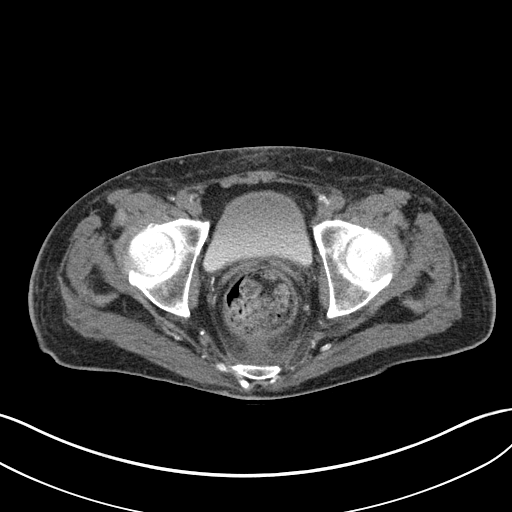
[im 36/72  soft-tissue]
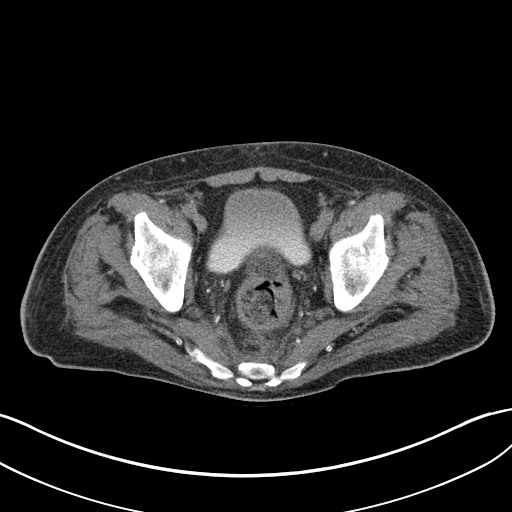
[im 41/72  soft-tissue]
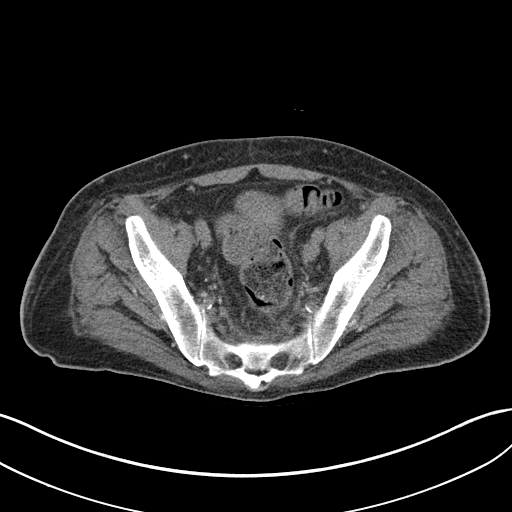
[im 47/72  soft-tissue]
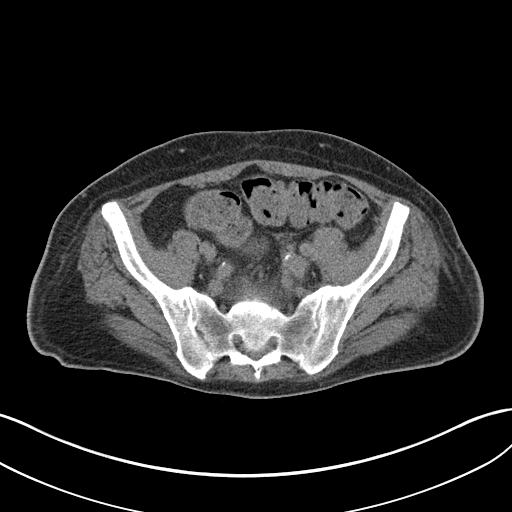
[im 47/72  bone]
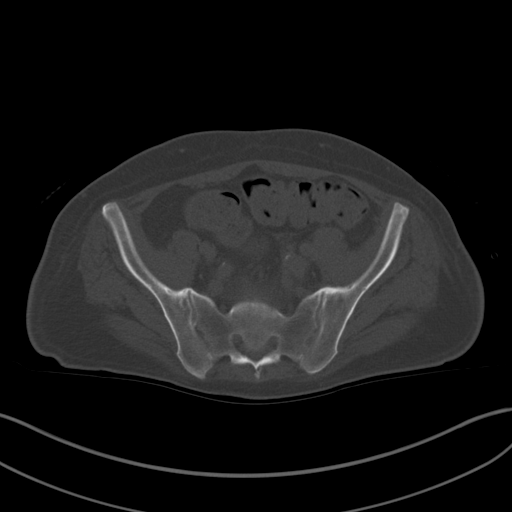
[im 52/72  soft-tissue]
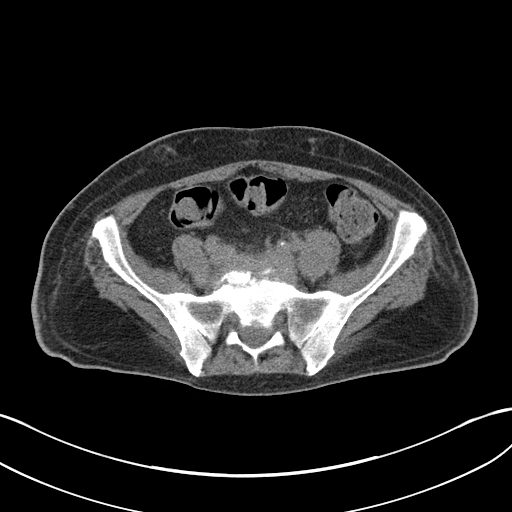
[im 58/72  soft-tissue]
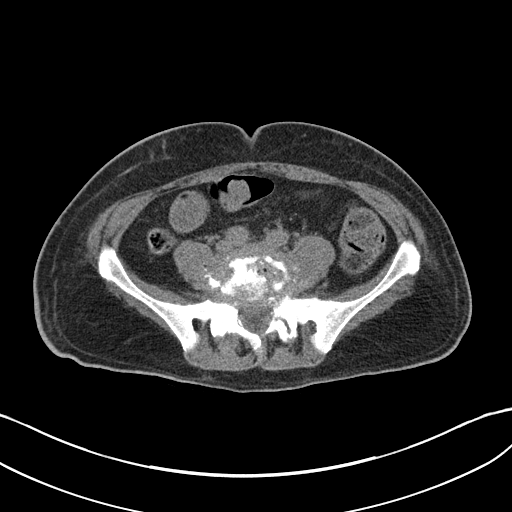
[im 61/72  lung]
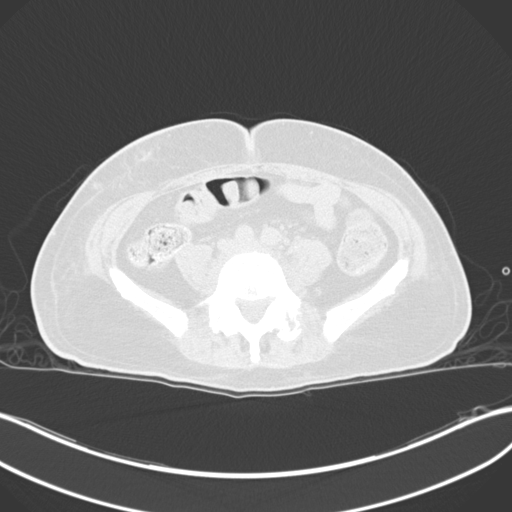
[im 63/72  soft-tissue]
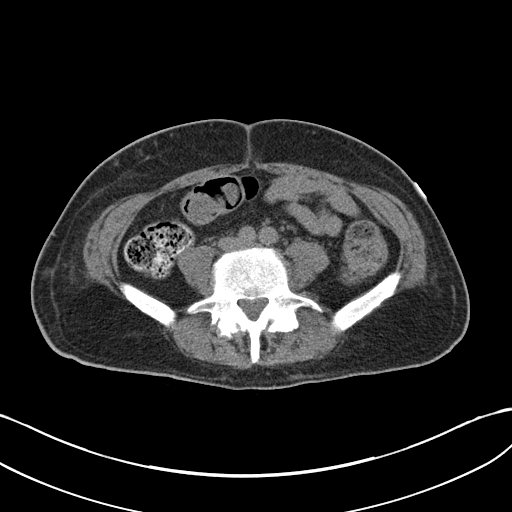
[im 63/72  lung]
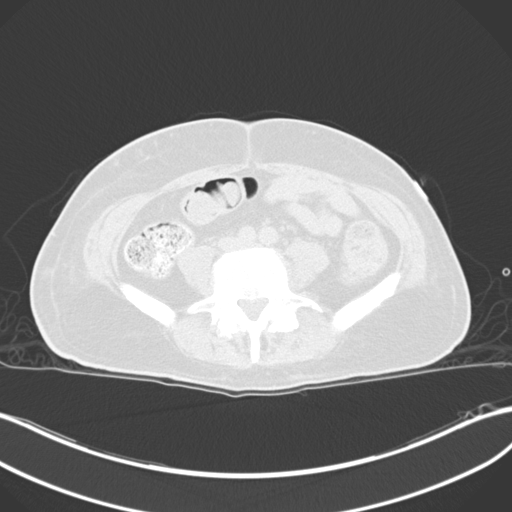
[im 66/72  lung]
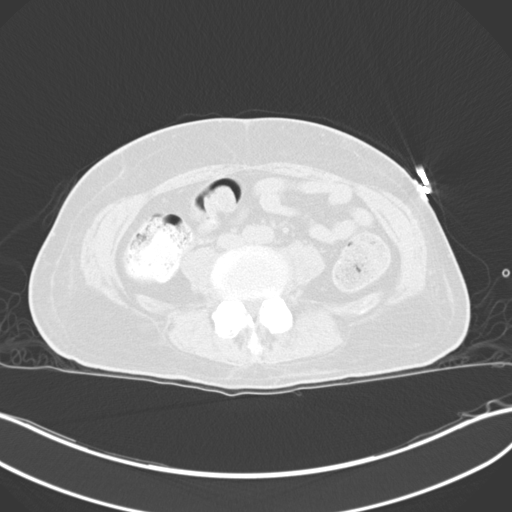
[im 69/72  soft-tissue]
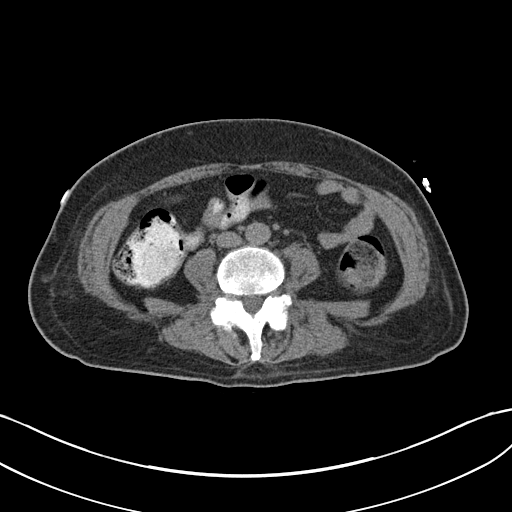
[im 69/72  lung]
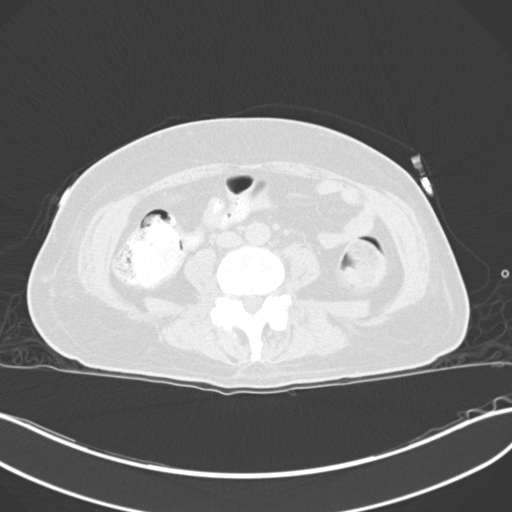

[15 of 32 positions shown; findings below may reference images not displayed]

EXAM:
CT-GUIDED ASPIRATION OF LEFT PSOAS ABSCESS

MEDICATIONS:
NONE

ANESTHESIA/SEDATION:
Fentanyl 50 mcg IV; Versed 1.0 mg IV

Moderate Sedation Time:  11 minutes

The patient was continuously monitored during the procedure by the
interventional radiology nurse under my direct supervision.

COMPLICATIONS:
None

PROCEDURE:
Informed written consent was obtained from the patient after a
thorough discussion of the procedural risks, benefits and
alternatives. All questions were addressed. Maximal Sterile Barrier
Technique was utilized including caps, mask, sterile gowns, sterile
gloves, sterile drape, hand hygiene and skin antiseptic. A timeout
was performed prior to the initiation of the procedure.

Patient positioned supine position on CT gantry table and scout CT
was acquired for planning purposes.

The patient was then prepped and draped in the usual sterile
fashion. 1% lidocaine was used for local anesthesia. Using CT
guidance, a Yueh needle was advanced into the site of the psoas
abscess of the left psoas muscle, via a trans mesenteric approach,
anteriorly.

With once we confirmed needle tip position, we aspirated
approximately 6 cc of purulent material. Culture was sent. Needle
was removed and a final CT was acquired.

Patient tolerated the procedure well and remained hemodynamically
stable throughout.

No complications were encountered and no significant blood loss.
IMPRESSION: Status post CT-guided aspirate of small left psoas abscess for a
culture.

## 2021-03-19 MED ORDER — BISACODYL 10 MG RE SUPP
10.0000 mg | Freq: Once | RECTAL | Status: AC
  Administered 2021-03-19: 10 mg via RECTAL
  Filled 2021-03-19: qty 1

## 2021-03-19 MED ORDER — LIDOCAINE HCL 1 % IJ SOLN
INTRAMUSCULAR | Status: AC
  Filled 2021-03-19: qty 10

## 2021-03-19 MED ORDER — MIDAZOLAM HCL 2 MG/2ML IJ SOLN
INTRAMUSCULAR | Status: AC | PRN
  Administered 2021-03-19: 1 mg via INTRAVENOUS

## 2021-03-19 MED ORDER — HYDROMORPHONE HCL 1 MG/ML IJ SOLN
1.0000 mg | INTRAMUSCULAR | Status: DC | PRN
  Administered 2021-03-20 – 2021-03-29 (×12): 1 mg via INTRAVENOUS
  Filled 2021-03-19 (×13): qty 1

## 2021-03-19 MED ORDER — FENTANYL CITRATE (PF) 100 MCG/2ML IJ SOLN
INTRAMUSCULAR | Status: AC | PRN
  Administered 2021-03-19: 50 ug via INTRAVENOUS

## 2021-03-19 MED ORDER — HEPARIN SODIUM (PORCINE) 1000 UNIT/ML DIALYSIS
2200.0000 [IU] | Freq: Once | INTRAMUSCULAR | Status: AC
  Administered 2021-03-19: 2200 [IU] via INTRAVENOUS_CENTRAL

## 2021-03-19 MED ORDER — MIDAZOLAM HCL 2 MG/2ML IJ SOLN
INTRAMUSCULAR | Status: AC
  Filled 2021-03-19: qty 2

## 2021-03-19 MED ORDER — FENTANYL CITRATE (PF) 100 MCG/2ML IJ SOLN
INTRAMUSCULAR | Status: AC
  Filled 2021-03-19: qty 2

## 2021-03-19 NOTE — Plan of Care (Signed)
  Problem: Clinical Measurements: Goal: Respiratory complications will improve Outcome: Progressing   Problem: Clinical Measurements: Goal: Cardiovascular complication will be avoided Outcome: Progressing   Problem: Nutrition: Goal: Adequate nutrition will be maintained Outcome: Progressing   

## 2021-03-19 NOTE — Progress Notes (Signed)
Report given to HD nurse  

## 2021-03-19 NOTE — Care Management Important Message (Signed)
Important Message  Patient Details  Name: Ryan Wells MRN: [id number] Date of Birth: 1955-08-14   Medicare Important Message Given:  Yes     Orbie Pyo 03/19/2021, 4:25 PM

## 2021-03-19 NOTE — Progress Notes (Signed)
PROGRESS NOTE    Ryan Wells  ION:[id number] DOB: 1956/01/22 DOA: 03/14/2021 PCP: Pcp, No    Brief Narrative:  Ryan Wells is a 65 y.o. male with medical history significant of ESRD on HD, hypertension, diabetes mellitus type 2, anemia of chronic disease, and MSSA bacteremia presented with complaints of back and leg pain. Symptoms have been acutely worse at least for the last 2 weeks.  He complains of having severe 10/10 sharp pains in his lower back that radiate down either leg.  He complains of his legs feeling cold and being unable to stand or move around due to symptoms.  Denies any recent trauma or injury to onset symptoms.  Due to the symptoms he had missed hemodialysis yesterday, and last dialyzed on 8/1.  The other day he reported having uncontrollable sweating which was unusual for him.   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  It appears during this hospitalization he had MRI imaging which noted bilateral L5-S1 facet osteoarthritis left greater than right with synovial cysts and periarticular soft tissue edema on the left which superimposed septic arthritis could not be excluded and anterior displacement of the cord with distortion at T6-T7.  He had completed 6 weeks of IV antibiotics with dialysis per ID notes from 8/2.   Patient was last in the emergency department on 7/25 complaining of radicular low back pain.  At that time neurosurgery had been consulted and recommended a short course of steroids and to follow-up in the office.  Patient does not appear to have done this.  He denies having shortness of breath, fever, chest pain, nausea, vomiting, or recent trauma.  It appears he was referred for physical therapy, but had not been able to do it due to pain.   In the emergency department patient was seen to be afebrile with stable vital signs.  CT imaging of the head and  orbits negative for any acute intracranial pathology, but noted left facial and periorbital soft tissue contusion.  Labs from 8/3 significant for WBC 15.1, hemoglobin 10, potassium 4.4, BUN 63, creatinine 8.5, anion gap 20, and high-sensitivity troponin 84->101.  Chest x-ray significant for cardiomegaly with vascular congestion, focal area with pulmonary nodularity of the right mid lung correlates with cavitating lesion and a small right-sided pleural effusion.  Influenza and COVID-19 screening both were negative.  He had been given full dose aspirin, nitroglycerin, ketorolac, Rocephin IV, and ciprofloxacin ophthalmic drops for concern for preseptal cellulitis and acute bacterial conjunctivitis.   Assessment & Plan:   Principal Problem:   Lumbar back pain Active Problems:   Subclinical hypothyroidism   Anemia due to chronic kidney disease   Diabetes mellitus type II, non insulin dependent (HCC)   ESRD on hemodialysis (HCC)   Pleural effusion   Elevated troponin   Leg pain   Leukocytosis   History of bacteremia   Lumbar/sacral osteomyelitis/discitis, L5-S1 Septic arthritis L5-S1, POA Bilateral psoas abscess, POA Patient presents with acute worsening lumbar back pain with radiation down both legs to the point where he is unable to walk.  Patient with significant tenderness palpation of the lumbar spine. Recently completed 6-week course of antibiotics per infectious disease, on 8/2 for MSSA bacteremia.  WBC elevated 15.1, ESR 54 and CRP 22.1 on arrival.  MR L-spine without contrast 8/5 notable for L5-S1 discitis with osteomyelitis with left psoas abscess measuring 4.7 centimeters; no epidural collection identified.  CT pelvis  with IV contrast 8/6 with discitis/osteomyelitis L5-S1 with bilateral psoas abscesses measuring up to 46 mm in greatest dimension on the left with extensive paravertebral soft tissue inflammatory change with mild edema in the presacral space with septic arthritis  L5-S1. --Neurosurgery/IR/ID following, appreciate assistance --WBC 15.1>12.3>11.8>12.3>12.9 --Blood cultures x2 8/4: No growth x 3 days --Oxycodone as needed for pain --Cefazolin 1g IV q24h --IR drainage/aspiration of psoas abscess today --CBC daily   Left face contusion: Acute.   Patient was noted to have left facial and periorbital soft tissue contusion on CT imaging.  It appears he had initially been given ciprofloxacin and Rocephin IV for concern for periorbital cellulitis with concern for bacterial conjunctivitis.  However, on physical exam patient without significant erythema pain at this time. --Continue supportive care for now   Elevated troponin: Acute.   Patient denies any complaints of chest pain.  High-sensitivity troponin 84 -101.  EKG without significant ischemic changes.  Suspect secondary to demand. -Continue to monitor on telemetry   Right-sided pleural effusion:  Patient noted to have cardiomegaly with vascular congestion and small left-sided pleural effusion on imaging.  Patient did not appear to be grossly fluid overloaded on physical exam.  Oxygenating well on room air.  Continue volume management with HD.   ESRD on HD:  Patient had missed hemodialysis day prior to admission due to having significant pain, but labs appear to be relatively stable. --Continue hemodialysis per nephrology   Diabetes mellitus type 2:  Patient well controlled not on any medications for treatment.  Last hemoglobin A1c was 5.1 on 01/16/2021. --Continue renal and carb modified diet   History of MSSA bacteremia:  Patient had been treated and completed his 6 weeks of antibiotics from his previous hospitalization in June.  It appears he had no signs of endocarditis on TEE. --On cefazolin as above   Anemia chronic kidney disease:  Hemoglobin 10 g/dL which appears improved from previous, but baseline appears to be 8 g/dL. --Continue to monitor   Subclinical hypothyroidism:  TSH 6.154, free  T4 normal at 1.01.  Weakness/deconditioning/gait disturbance: --PT/OT following, currently recommending CIR on discharge   DVT prophylaxis: heparin injection 5,000 Units Start: 03/15/21 1400   Code Status: Full Code Family Communication: No family present at bedside this morning.  Disposition Plan:  Level of care: Telemetry Medical Status is: Inpatient  Remains inpatient appropriate because:Ongoing diagnostic testing needed not appropriate for outpatient work up, Unsafe d/c plan, IV treatments appropriate due to intensity of illness or inability to take PO, and Inpatient level of care appropriate due to severity of illness  Dispo: The patient is from: Home              Anticipated d/c is to: Home              Patient currently is not medically stable to d/c.   Difficult to place patient No   Consultants:  Neurosurgery Nephrology Infectious disease Interventional radiology  Procedures:  IR aspiration versus drain psoas abscess plan for today  Antimicrobials:  Ceftriaxone 8/4 - 8/6 Cefazolin 8/6>>   Subjective: Patient seen examined at bedside, resting comfortably.  Currently in HD unit receiving hemodialysis.  Continues with low back pain/left leg pain and generalized weakness.  IR planning for drainage/aspiration of psoas abscess today.   No other questions or concerns at this time. Denies headache, no visual changes, no chest pain, palpitations, no shortness of breath, no abdominal pain, no weakness, no fatigue, no paresthesias.  No acute events  overnight per nursing staff.  Objective: Vitals:   03/19/21 0825 03/19/21 0900 03/19/21 0929 03/19/21 0951  BP: (!) 156/71 (!) 142/70 111/61 115/60  Pulse: 69 70 70   Resp:  _0 Temp:      TempSrc:      SpO2:      Weight:      Height:        Intake/Output Summary (Last 24 hours) at 03/19/2021 1010 Last data filed at 03/19/2021 0400 Gross per 24 hour  Intake 290 ml  Output --  Net 290 ml   Filed Weights   03/14/21  2254 03/16/21 0815 03/19/21 0818  Weight: 69 kg 63.2 kg 60.1 kg    Examination:  General exam: Appears calm and comfortable, poor dentition Respiratory system: Clear to auscultation. Respiratory effort normal. Cardiovascular system: S1 & S2 heard, RRR. No JVD, murmurs, rubs, gallops or clicks. No pedal edema. Gastrointestinal system: Abdomen is nondistended, soft and nontender. No organomegaly or masses felt. Normal bowel sounds heard. Central nervous system: Alert and oriented. No focal neurological deficits. Extremities: Symmetric 5 x 5 power.  Neurovascular intact, sensation to light touch intact. Skin: No rashes, lesions or ulcers Psychiatry: Judgement and insight appear normal. Mood & affect appropriate.     Data Reviewed: I have personally reviewed following labs and imaging studies  CBC: Recent Labs  Lab 03/14/21 2259 03/16/21 0049 03/16/21 0832 03/17/21 0219 03/18/21 0349 03/19/21 0415  WBC 15.1* 12.3* 11.8* 12.3* 12.9* 12.0*  NEUTROABS 12.8*  --   --   --   --   --   HGB 10.0* 9.4* 9.5* 10.3* 10.6* 10.5*  HCT 32.0* 29.9* 30.8* 34.0* 35.5* 35.0*  MCV 95.8 97.1 98.1 98.0 99.2 99.4  PLT 212 193 177 193 209 035   Basic Metabolic Panel: Recent Labs  Lab 03/15/21 1150 03/16/21 0049 03/17/21 0219 03/18/21 0349 03/19/21 0415  NA 142 136 134* 133* 130*  K 4.0 4.1 3.7 4.5 4.7  CL 95* 91* 94* 92* 89*  CO2 _1 GLUCOSE 95 181* 178* 157* 180*  BUN 70* 78* 34* 50* 60*  CREATININE 9.60* 10.32* 5.94* 7.79* 9.22*  CALCIUM 7.6* 7.4* 7.7* 8.2* 8.5*  PHOS 9.0* 9.0* 4.0 4.2 5.0*   GFR: Estimated Creatinine Clearance: 6.1 mL/min (A) (by C-G formula based on SCr of 9.22 mg/dL (H)). Liver Function Tests: Recent Labs  Lab 03/14/21 2259 03/15/21 1150 03/16/21 0049 03/17/21 0219 03/18/21 0349 03/19/21 0415  AST 21  --   --   --   --   --   ALT 9  --   --   --   --   --   ALKPHOS 107  --   --   --   --   --   BILITOT 1.2  --   --   --   --   --   PROT 7.1   --   --   --   --   --   ALBUMIN 3.4* 2.4* 2.5* 2.5* 2.4* 2.4*   No results for input(s): LIPASE, AMYLASE in the last 168 hours. No results for input(s): AMMONIA in the last 168 hours. Coagulation Profile: Recent Labs  Lab 03/19/21 0415  INR 1.1   Cardiac Enzymes: No results for input(s): CKTOTAL, CKMB, CKMBINDEX, TROPONINI in the last 168 hours. BNP (last 3 results) No results for input(s): PROBNP in the last 8760 hours. HbA1C: No results for input(s): HGBA1C in the last 72  hours. CBG: No results for input(s): GLUCAP in the last 168 hours. Lipid Profile: No results for input(s): CHOL, HDL, LDLCALC, TRIG, CHOLHDL, LDLDIRECT in the last 72 hours. Thyroid Function Tests: No results for input(s): TSH, T4TOTAL, FREET4, T3FREE, THYROIDAB in the last 72 hours.  Anemia Panel: No results for input(s): VITAMINB12, FOLATE, FERRITIN, TIBC, IRON, RETICCTPCT in the last 72 hours. Sepsis Labs: No results for input(s): PROCALCITON, LATICACIDVEN in the last 168 hours.  Recent Results (from the past 240 hour(s))  Resp Panel by RT-PCR (Flu A&B, Covid) Nasopharyngeal Swab     Status: None   Collection Time: 03/15/21  2:08 AM   Specimen: Nasopharyngeal Swab; Nasopharyngeal(NP) swabs in vial transport medium  Result Value Ref Range Status   SARS Coronavirus 2 by RT PCR NEGATIVE NEGATIVE Final    Comment: (NOTE) SARS-CoV-2 target nucleic acids are NOT DETECTED.  The SARS-CoV-2 RNA is generally detectable in upper respiratory specimens during the acute phase of infection. The lowest concentration of SARS-CoV-2 viral copies this assay can detect is 138 copies/mL. A negative result does not preclude SARS-Cov-2 infection and should not be used as the sole basis for treatment or other patient management decisions. A negative result may occur with  improper specimen collection/handling, submission of specimen other than nasopharyngeal swab, presence of viral mutation(s) within the areas targeted by  this assay, and inadequate number of viral copies(<138 copies/mL). A negative result must be combined with clinical observations, patient history, and epidemiological information. The expected result is Negative.  Fact Sheet for Patients:  EntrepreneurPulse.com.au  Fact Sheet for Healthcare Providers:  IncredibleEmployment.be  This test is no t yet approved or cleared by the [place] FDA and  has been authorized for detection and/or diagnosis of SARS-CoV-2 by FDA under an Emergency Use Authorization (EUA). This EUA will remain  in effect (meaning this test can be used) for the duration of the COVID-19 declaration under Section 564(b)(1) of the Act, 21 U.S.C.section 360bbb-3(b)(1), unless the authorization is terminated  or revoked sooner.       Influenza A by PCR NEGATIVE NEGATIVE Final   Influenza B by PCR NEGATIVE NEGATIVE Final    Comment: (NOTE) The Xpert Xpress SARS-CoV-2/FLU/RSV plus assay is intended as an aid in the diagnosis of influenza from Nasopharyngeal swab specimens and should not be used as a sole basis for treatment. Nasal washings and aspirates are unacceptable for Xpert Xpress SARS-CoV-2/FLU/RSV testing.  Fact Sheet for Patients: EntrepreneurPulse.com.au  Fact Sheet for Healthcare Providers: IncredibleEmployment.be  This test is not yet approved or cleared by the [place] FDA and has been authorized for detection and/or diagnosis of SARS-CoV-2 by FDA under an Emergency Use Authorization (EUA). This EUA will remain in effect (meaning this test can be used) for the duration of the COVID-19 declaration under Section 564(b)(1) of the Act, 21 U.S.C. section 360bbb-3(b)(1), unless the authorization is terminated or revoked.  Performed at [facility], [address] [place], [place] [address]   Culture, blood (routine x 2)     Status: None (Preliminary  result)   Collection Time: 03/15/21  3:39 PM   Specimen: BLOOD  Result Value Ref Range Status   Specimen Description BLOOD RIGHT ANTECUBITAL  Final   Special Requests   Final    BOTTLES DRAWN AEROBIC AND ANAEROBIC Blood Culture results may not be optimal due to an inadequate volume of blood received in culture bottles   Culture   Final    NO GROWTH 3 DAYS Performed at  [facility], [place] [address] [place], [place] [address]    Report Status PENDING  Incomplete  Culture, blood (routine x 2)     Status: None (Preliminary result)   Collection Time: 03/15/21  3:47 PM   Specimen: BLOOD RIGHT HAND  Result Value Ref Range Status   Specimen Description BLOOD RIGHT HAND  Final   Special Requests   Final    BOTTLES DRAWN AEROBIC AND ANAEROBIC Blood Culture adequate volume   Culture   Final    NO GROWTH 3 DAYS Performed at [facility], [place] [address], [place], [place] [address]    Report Status PENDING  Incomplete         Radiology Studies: CT PELVIS W CONTRAST  Result Date: 03/17/2021 CLINICAL DATA:  Abscess, anal or rectal psoas abcess EXAM: CT PELVIS WITH CONTRAST TECHNIQUE: Multidetector CT imaging of the pelvis was performed using the standard protocol following the bolus administration of intravenous contrast. CONTRAST:  165m OMNIPAQUE IOHEXOL 300 MG/ML  SOLN COMPARISON:  CT abdomen pelvis 01/15/2021, MRI lumbar spine 03/16/2021 FINDINGS: Urinary Tract:  No abnormality visualized. Bowel: The visualized bowel loops are unremarkable. The visualized appendix is normal. There is moderate presacral edema likely related to the inflammatory process at L5-S1. No discrete drainable fluid collection within this region. Vascular/Lymphatic: The abdominal vasculature is unremarkable. No pathologic adenopathy within the pelvis. Reproductive:  Prostate gland is unremarkable. Other: Left psoas abscess is again identified measuring 21 x 23 x 46 mm in greatest dimension. Smaller 12 mm right  psoas abscesses are present adjacent to L5-S1. Extensive paravertebral inflammatory stranding and soft tissue infiltration is seen at L5-S1 in keeping with changes of discitis osteomyelitis at this location. Associated endplate erosion is noted at this level. Small posterior collection abuts the thecal sac at this level, unchanged from prior exam. Musculoskeletal: Aside from inflammatory changes related to L5-S1 discitis osteomyelitis, there is again noted widening of the left L5-S1 facet with cortical erosion in keeping with septic arthritis in this location. No other bone abnormality is identified. IMPRESSION: Discitis/osteomyelitis L5-S1 with bilateral psoas abscesses measuring up to 46 mm in greatest dimension on the left. Extensive paravertebral soft tissue inflammatory change with mild edema extending into the presacral space. Septic arthritis involving the left L5-S1 facet joint. Electronically Signed   By: AFidela SalisburyMD   On: 03/17/2021 15:52        Scheduled Meds:  Chlorhexidine Gluconate Cloth  6 each Topical Q0600   darbepoetin (ARANESP) injection - DIALYSIS  100 mcg Intravenous Q Fri-HD   doxercalciferol  3 mcg Intravenous Q M,W,F-HD   feeding supplement (NEPRO CARB STEADY)  237 mL Oral BID BM   gabapentin  100 mg Oral QHS   heparin  5,000 Units Subcutaneous Q8H   midodrine  10 mg Oral TID WC   multivitamin  1 tablet Oral QHS   sodium chloride flush  3 mL Intravenous Q12H   sucroferric oxyhydroxide  1,000 mg Oral TID WC   Continuous Infusions:   ceFAZolin (ANCEF) IV 1 g (03/18/21 2255)     LOS: 4 days    Time spent: 39 minutes spent on chart review, discussion with nursing staff, consultants, updating family and interview/physical exam; more than 50% of that time was spent in counseling and/or coordination of care.    Marquice Uddin J [place] DO [facility] Available via Epic secure chat 7am-7pm After these hours, please refer to coverage provider listed on  [email or web address] 03/19/2021, 10:10 AM

## 2021-03-19 NOTE — Progress Notes (Signed)
Physical Therapy Treatment Patient Details Name: Ryan Wells MRN: [id number] DOB: 16-Oct-1955 Today's Date: 03/19/2021    History of Present Illness Pt is 65 y.o. male presenting to ED on 8/3 with severe back/leg pain x2 weeks and diaphoresis.  Pt admitted with lumbar sacral osteomyelitis/discitis with septic arthritis L5-S1 and bil psoas abscess, R sided pleural effusion. Noted plan for aspiration of L psoas fluid collection on 8/8 but has not been done at this time. 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 At time of previous admission MRI (+) L5-S1 facet osteoarthritis, synovial cysts and periarticular soft tissue edema on the left. 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 Returned to ED 7/25 c/o radicular low back pain. Neurosurgery recommended short course of steroids and to follow up in office. Patient does not appear to have done this. Patient also reports inability to participate with outpt PT services secondary to pain.  PMHx significant for ESRD on HD, HTN, DMII, chornic anemia and MSSA.    PT Comments    Pt remains limited by severe pain.  Pt yelling in pain with max x 2 transfer to EOB with increased time and log roll technique for pain control. He had received oral pain meds prior to session but did not improve tolerance - RN reports she will check to see if pt can have IV meds before next session.  Did note plan is to aspirate abscess but this has not been done at this time. Will continue to follow as able.    Follow Up Recommendations  CIR     Equipment Recommendations  Other (comment) (needs further assessment)    Recommendations for Other Services Rehab consult     Precautions / Restrictions Precautions Precautions: Fall Precaution Comments: Back pain    Mobility  Bed Mobility Overal bed mobility: Needs Assistance Bed Mobility: Rolling;Sit to Sidelying;Sidelying to Sit Rolling: Max assist;+2  for physical assistance Sidelying to sit: Max assist;+2 for physical assistance     Sit to sidelying: Max assist;+2 for physical assistance General bed mobility comments: Increased time for all with cues for log roll technique.  REquired max x 2 to sit and was screaming in pain that did not ease at EOB so returned to supine within 10 seconds.    Transfers                 General transfer comment: unable to attempt due to severe pain  Ambulation/Gait                 Stairs             Wheelchair Mobility    Modified Rankin (Stroke Patients Only)       Balance Overall balance assessment: Needs assistance Sitting-balance support: Bilateral upper extremity supported;Feet supported Sitting balance-Leahy Scale: Poor Sitting balance - Comments: Assist of 2, only sats 5-10 seconds due to pain       Standing balance comment: unable due to pain                            Cognition Arousal/Alertness: Awake/alert Behavior During Therapy: WFL for tasks assessed/performed Overall Cognitive Status: No family/caregiver present to determine baseline cognitive functioning                                 General Comments: No family to determine  baseline but pt requiring increased time to respond to interpretor and at times not finishing sentences.      Exercises      General Comments        Pertinent Vitals/Pain Pain Assessment: 0-10 Pain Score: 8  (8/10 rest but 10/10 activity) Pain Location: Back - did not specify specific area Pain Descriptors / Indicators: Discomfort;Sharp;Shooting;Guarding;Grimacing (At rest reports 8/10 with tense posture.  With activity - pt yelling, grimacing and unable to tolerate) Pain Intervention(s): Limited activity within patient's tolerance;Monitored during session;Repositioned;Premedicated before session (Pt had received oral meds abour 1 hr prior to session but still had severe 10/10 pain with  symptoms.  RN heard pt scream and reports will check about IV pain meds for next visit)    Home Living                      Prior Function            PT Goals (current goals can now be found in the care plan section) Acute Rehab PT Goals Patient Stated Goal: to be able to do for myself PT Goal Formulation: With patient Time For Goal Achievement: 03/31/21 Potential to Achieve Goals: Fair Progress towards PT goals: Progressing toward goals (pain limitng)    Frequency    Min 4X/week      PT Plan Current plan remains appropriate    Co-evaluation              AM-PAC PT "6 Clicks" Mobility   Outcome Measure  Help needed turning from your back to your side while in a flat bed without using bedrails?: A Lot Help needed moving from lying on your back to sitting on the side of a flat bed without using bedrails?: Total Help needed moving to and from a bed to a chair (including a wheelchair)?: Total Help needed standing up from a chair using your arms (e.g., wheelchair or bedside chair)?: Total Help needed to walk in hospital room?: Total Help needed climbing 3-5 steps with a railing? : Total 6 Click Score: 7    End of Session   Activity Tolerance: Patient limited by pain Patient left: in bed;with call bell/phone within reach;with bed alarm set Nurse Communication: Mobility status PT Visit Diagnosis: Difficulty in walking, not elsewhere classified (R26.2);Pain Pain - part of body:  (back)     Time: 1448-1510 PT Time Calculation (min) (ACUTE ONLY): 22 min  Charges:  $Therapeutic Activity: 8-22 mins                     Ryan Wells, PT Acute Rehab Services Pager [phone] [facility] Rehab [phone]    Ryan Wells 03/19/2021, 4:26 PM

## 2021-03-19 NOTE — Procedures (Signed)
Interventional Radiology Procedure Note  Procedure:   CT guided aspiration of left psoas abscess.  ~6cc purulent material aspirated for culture. The site is perceived to be too small for drain placement.  .  Complications: None  Recommendations:  - Ok to shower tomorrow - Do not submerge for 7 days - Follow up culture.   Signed,  Dulcy Fanny. Earleen Newport, DO

## 2021-03-19 NOTE — Progress Notes (Signed)
Inpatient Rehabilitation Admissions Coordinator   Patient in hemodialysis this morning and then plans for IR drainage of psoas abscess today. I will follow up tomorrow for rehab assessment and discussions.  Danne Baxter, RN, MSN Rehab Admissions Coordinator [phone] 03/19/2021 12:59 PM

## 2021-03-19 NOTE — Progress Notes (Signed)
[facility] Progress Note   Subjective: Seen on hemodialysis. Tolerating HD without issues. Still C/O back pain.    Objective Vitals:   03/18/21 2133 03/19/21 0454 03/19/21 0818 03/19/21 0825  BP: (!) 148/78 136/71 (!) 153/68 (!) 156/71  Pulse: 68 70 70 69  Resp: '20 16 10   '$ Temp: 98.4 F (36.9 C) 98.1 F (36.7 C) 98.8 F (37.1 C)   TempSrc:  Oral Oral   SpO2: 98% 96% 96%   Weight:   60.1 kg   Height:       Physical Exam General: Pleasant older male in NAD Heart: S1,S2 RRR Lungs: CTAB Abdomen:NABS Extremities: No LE edema Dialysis Access: L AVF cannulated at present    Additional Objective Labs: Basic Metabolic Panel: Recent Labs  Lab 03/17/21 0219 03/18/21 0349 03/19/21 0415  NA 134* 133* 130*  K 3.7 4.5 4.7  CL 94* 92* 89*  CO2 '27 27 26  '$ GLUCOSE 178* 157* 180*  BUN 34* 50* 60*  CREATININE 5.94* 7.79* 9.22*  CALCIUM 7.7* 8.2* 8.5*  PHOS 4.0 4.2 5.0*   Liver Function Tests: Recent Labs  Lab 03/14/21 2259 03/15/21 1150 03/17/21 0219 03/18/21 0349 03/19/21 0415  AST 21  --   --   --   --   ALT 9  --   --   --   --   ALKPHOS 107  --   --   --   --   BILITOT 1.2  --   --   --   --   PROT 7.1  --   --   --   --   ALBUMIN 3.4*   < > 2.5* 2.4* 2.4*   < > = values in this interval not displayed.   No results for input(s): LIPASE, AMYLASE in the last 168 hours. CBC: Recent Labs  Lab 03/14/21 2259 03/16/21 0049 03/16/21 0832 03/17/21 0219 03/18/21 0349 03/19/21 0415  WBC 15.1* 12.3* 11.8* 12.3* 12.9* 12.0*  NEUTROABS 12.8*  --   --   --   --   --   HGB 10.0* 9.4* 9.5* 10.3* 10.6* 10.5*  HCT 32.0* 29.9* 30.8* 34.0* 35.5* 35.0*  MCV 95.8 97.1 98.1 98.0 99.2 99.4  PLT 212 193 177 193 209 214   Blood Culture    Component Value Date/Time   SDES BLOOD RIGHT HAND 03/15/2021 1547   SPECREQUEST  03/15/2021 1547    BOTTLES DRAWN AEROBIC AND ANAEROBIC Blood Culture adequate volume   CULT  03/15/2021 1547    NO GROWTH 3 DAYS Performed  at [facility], [place] [address] [place], [place] [address]    REPTSTATUS PENDING 03/15/2021 1547    Cardiac Enzymes: No results for input(s): CKTOTAL, CKMB, CKMBINDEX, TROPONINI in the last 168 hours. CBG: No results for input(s): GLUCAP in the last 168 hours. Iron Studies: No results for input(s): IRON, TIBC, TRANSFERRIN, FERRITIN in the last 72 hours. '@lablastinr3'$ @ Studies/Results: CT PELVIS W CONTRAST  Result Date: 03/17/2021 CLINICAL DATA:  Abscess, anal or rectal psoas abcess EXAM: CT PELVIS WITH CONTRAST TECHNIQUE: Multidetector CT imaging of the pelvis was performed using the standard protocol following the bolus administration of intravenous contrast. CONTRAST:  155m OMNIPAQUE IOHEXOL 300 MG/ML  SOLN COMPARISON:  CT abdomen pelvis 01/15/2021, MRI lumbar spine 03/16/2021 FINDINGS: Urinary Tract:  No abnormality visualized. Bowel: The visualized bowel loops are unremarkable. The visualized appendix is normal. There is moderate presacral edema likely related to the inflammatory process at L5-S1. No discrete  drainable fluid collection within this region. Vascular/Lymphatic: The abdominal vasculature is unremarkable. No pathologic adenopathy within the pelvis. Reproductive:  Prostate gland is unremarkable. Other: Left psoas abscess is again identified measuring 21 x 23 x 46 mm in greatest dimension. Smaller 12 mm right psoas abscesses are present adjacent to L5-S1. Extensive paravertebral inflammatory stranding and soft tissue infiltration is seen at L5-S1 in keeping with changes of discitis osteomyelitis at this location. Associated endplate erosion is noted at this level. Small posterior collection abuts the thecal sac at this level, unchanged from prior exam. Musculoskeletal: Aside from inflammatory changes related to L5-S1 discitis osteomyelitis, there is again noted widening of the left L5-S1 facet with cortical erosion in keeping with septic arthritis in this location. No other bone  abnormality is identified. IMPRESSION: Discitis/osteomyelitis L5-S1 with bilateral psoas abscesses measuring up to 46 mm in greatest dimension on the left. Extensive paravertebral soft tissue inflammatory change with mild edema extending into the presacral space. Septic arthritis involving the left L5-S1 facet joint. Electronically Signed   By: Fidela Salisbury MD   On: 03/17/2021 15:52   Medications:   ceFAZolin (ANCEF) IV 1 g (03/18/21 2255)   iron sucrose 100 mg (03/16/21 1036)    Chlorhexidine Gluconate Cloth  6 each Topical Q0600   darbepoetin (ARANESP) injection - DIALYSIS  100 mcg Intravenous Q Fri-HD   doxercalciferol  3 mcg Intravenous Q M,W,F-HD   feeding supplement (NEPRO CARB STEADY)  237 mL Oral BID BM   gabapentin  100 mg Oral QHS   heparin  2,200 Units Dialysis Once in dialysis   heparin  5,000 Units Subcutaneous Q8H   midodrine  10 mg Oral TID WC   multivitamin  1 tablet Oral QHS   sodium chloride flush  3 mL Intravenous Q12H   sucroferric oxyhydroxide  1,000 mg Oral TID WC     Dialysis Orders:[facility]  3h 71mn  180NRe 400/600  64kg   2/2 bath  P4  AVF   -Heparin  2200 units IV TIW -Mircera 150 mcg IV q 2 week (last dose 150 mcg IV 02/28/2021) -Venofer 100 mg IV X 10 doses (4/6 doses given.  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  Neurosurgery evaluated and no intervention deemed necessary.  Per ID input, will change to cefazolin (as likely MSSA given h/o MSSA bacteremia recently).  IR consulted for drainage of psoas abscess for GS, cx, and sensitivity.   Pulmonary edema: Vascular congestion, small R pleural effusion on CXR but appears mild in comparison to CXR 01/2021. He has not been eating well, has most likely lost body wt. No acute dialysis needs today. No evidence of overt volume overload.   Elevated troponin: In setting of vascular congestion and ESRD.  Troponin 84, 101 so far. Denies chest pain. Per primary Preseptal cellulitis with bacterial conjunctivitis-per primary  ESRD -  Missed HD 03/14/21 but HD 03/16/2021. 2.0 K bath-K+ 4.4. Low dose heparin. SCr 8.5 BUN 63. No evidence of uremia. Now back on outpatient schedule. At present time he is not able to sit up without significant discomfort.  He may need rehab before he can be discharged to home for outpatient dialysis.  Hypertension/volume  - As noted above. BP well controlled. On midodrine 10 mg PO TID. No evidence of overt volume overload by exam.  Lower EDW as tolerated.  Anemia  - HGB 10.5.  Missed OP  ESA 08/03. Give today with HD. Stop Fe load in setting of infection.   Metabolic bone disease - Continue velphoro 500 mg 2 tabs PO TID AC. Continue VDRA. Labs at goal.   Nutrition - Renal carb mod diet, nepro, renal vits.  DM-No meds on OP list. Last HA1c 6.9. per primary Disposition - will likely need SNF given severe debilitation from back pain and inability to sit up/ambulate.  Leonore Frankson H. Edoardo Laforte NP-C 03/19/2021, 9:04 AM  [employer] [phone]

## 2021-03-20 DIAGNOSIS — M545 Low back pain, unspecified: Secondary | ICD-10-CM | POA: Diagnosis not present

## 2021-03-20 LAB — CBC
HCT: 36.7 % — ABNORMAL LOW (ref 39.0–52.0)
Hemoglobin: 11.4 g/dL — ABNORMAL LOW (ref 13.0–17.0)
MCH: 30.7 pg (ref 26.0–34.0)
MCHC: 31.1 g/dL (ref 30.0–36.0)
MCV: 98.9 fL (ref 80.0–100.0)
Platelets: 227 10*3/uL (ref 150–400)
RBC: 3.71 MIL/uL — ABNORMAL LOW (ref 4.22–5.81)
RDW: 17.7 % — ABNORMAL HIGH (ref 11.5–15.5)
WBC: 11.5 10*3/uL — ABNORMAL HIGH (ref 4.0–10.5)
nRBC: 0 % (ref 0.0–0.2)

## 2021-03-20 LAB — CULTURE, BLOOD (ROUTINE X 2)
Culture: NO GROWTH
Culture: NO GROWTH
Special Requests: ADEQUATE

## 2021-03-20 LAB — RENAL FUNCTION PANEL
Albumin: 2.5 g/dL — ABNORMAL LOW (ref 3.5–5.0)
Anion gap: 13 (ref 5–15)
BUN: 25 mg/dL — ABNORMAL HIGH (ref 8–23)
CO2: 26 mmol/L (ref 22–32)
Calcium: 8.2 mg/dL — ABNORMAL LOW (ref 8.9–10.3)
Chloride: 94 mmol/L — ABNORMAL LOW (ref 98–111)
Creatinine, Ser: 5.28 mg/dL — ABNORMAL HIGH (ref 0.61–1.24)
GFR, Estimated: 11 mL/min — ABNORMAL LOW (ref >60)
Glucose, Bld: 107 mg/dL — ABNORMAL HIGH (ref 70–99)
Phosphorus: 4.6 mg/dL (ref 2.5–4.6)
Potassium: 4.2 mmol/L (ref 3.5–5.1)
Sodium: 133 mmol/L — ABNORMAL LOW (ref 135–145)

## 2021-03-20 MED ORDER — OXYCODONE-ACETAMINOPHEN 5-325 MG PO TABS
1.0000 | ORAL_TABLET | ORAL | Status: DC | PRN
  Administered 2021-03-21 – 2021-03-23 (×6): 1 via ORAL
  Filled 2021-03-20 (×6): qty 1

## 2021-03-20 NOTE — Progress Notes (Signed)
Inpatient Rehabilitation Admissions Coordinator   Inpatient rehab consult received, I met with patient at bedside using video interpreter Dalia # [phone]. I discussed the need for better pain control to see if he can do more with therapy. Pain limiting his ability at this time and not at a level to consider for CIR admit. He feels this did not happen overnight, and will not improve quickly. He lives at home with wife and has 2 sons that can assist. I will follow his progress over the next few days to see if his tolerance with therapies improve to pursue CIR admit vs other rehab venue options. He states his pain is good in bed moving around ,until he tries to bear weight on his legs.  Danne Baxter, RN, MSN Rehab Admissions Coordinator [phone] 03/20/2021 10:48 AM

## 2021-03-20 NOTE — Progress Notes (Signed)
PROGRESS NOTE    Ryan Wells  QHU:[id number] DOB: 14-Apr-1956 DOA: 03/14/2021 PCP: Pcp, No    Brief Narrative:  Ryan Wells is a 65 y.o. male with medical history significant of ESRD on HD, hypertension, diabetes mellitus type 2, anemia of chronic disease, and MSSA bacteremia presented with complaints of back and leg pain. Symptoms have been acutely worse at least for the last 2 weeks.  He complains of having severe 10/10 sharp pains in his lower back that radiate down either leg.  He complains of his legs feeling cold and being unable to stand or move around due to symptoms.  Denies any recent trauma or injury to onset symptoms.  Due to the symptoms he had missed hemodialysis yesterday, and last dialyzed on 8/1.  The other day he reported having uncontrollable sweating which was unusual for him.   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  It appears during this hospitalization he had MRI imaging which noted bilateral L5-S1 facet osteoarthritis left greater than right with synovial cysts and periarticular soft tissue edema on the left which superimposed septic arthritis could not be excluded and anterior displacement of the cord with distortion at T6-T7.  He had completed 6 weeks of IV antibiotics with dialysis per ID notes from 8/2.   Patient was last in the emergency department on 7/25 complaining of radicular low back pain.  At that time neurosurgery had been consulted and recommended a short course of steroids and to follow-up in the office.  Patient does not appear to have done this.  He denies having shortness of breath, fever, chest pain, nausea, vomiting, or recent trauma.  It appears he was referred for physical therapy, but had not been able to do it due to pain.   In the emergency department patient was seen to be afebrile with stable vital signs.  CT imaging of the head and  orbits negative for any acute intracranial pathology, but noted left facial and periorbital soft tissue contusion.  Labs from 8/3 significant for WBC 15.1, hemoglobin 10, potassium 4.4, BUN 63, creatinine 8.5, anion gap 20, and high-sensitivity troponin 84->101.  Chest x-ray significant for cardiomegaly with vascular congestion, focal area with pulmonary nodularity of the right mid lung correlates with cavitating lesion and a small right-sided pleural effusion.  Influenza and COVID-19 screening both were negative.  He had been given full dose aspirin, nitroglycerin, ketorolac, Rocephin IV, and ciprofloxacin ophthalmic drops for concern for preseptal cellulitis and acute bacterial conjunctivitis.   Assessment & Plan:   Principal Problem:   Lumbar back pain Active Problems:   Subclinical hypothyroidism   Anemia due to chronic kidney disease   Diabetes mellitus type II, non insulin dependent (HCC)   ESRD on hemodialysis (HCC)   Pleural effusion   Elevated troponin   Leg pain   Leukocytosis   History of bacteremia   Lumbar/sacral osteomyelitis/discitis, L5-S1 Septic arthritis L5-S1, POA Bilateral psoas abscess s/p IR aspiration, POA Patient presents with acute worsening lumbar back pain with radiation down both legs to the point where he is unable to walk.  Patient with significant tenderness palpation of the lumbar spine. Recently completed 6-week course of antibiotics per infectious disease, on 8/2 for MSSA bacteremia.  WBC elevated 15.1, ESR 54 and CRP 22.1 on arrival.  MR L-spine without contrast 8/5 notable for L5-S1 discitis with osteomyelitis with left psoas abscess measuring 4.7 centimeters; no epidural collection identified.  CT pelvis with IV contrast 8/6 with discitis/osteomyelitis L5-S1 with bilateral psoas abscesses measuring up to 46 mm in greatest dimension on the left with extensive paravertebral soft tissue inflammatory change with mild edema in the presacral space with septic  arthritis L5-S1.  Patient underwent IR aspiration of left psoas abscess on 8/8. --Neurosurgery/IR/ID following, appreciate assistance --WBC 15.1>12.3>11.8>12.3>12.9>11.5 --Blood cultures x2 8/4: No growth x 5 days --Wound culture left psoas abscess 8/8: Few GPC's, identification/susceptibilities pending --Percocet 5/325mg  PO q4h prn moderate pain --Dilaudid 1mg  IV q3h prn severe breakthrough pain --Cefazolin 1g IV q24h --CBC daily   Left face contusion:  Patient was noted to have left facial and periorbital soft tissue contusion on CT imaging.  It appears he had initially been given ciprofloxacin and Rocephin IV for concern for periorbital cellulitis with concern for bacterial conjunctivitis.  However, on physical exam patient without significant erythema pain at this time. --Continue supportive care for now   Elevated troponin:  Patient denies any complaints of chest pain.  High-sensitivity troponin 84 -101.  EKG without significant ischemic changes.  Suspect secondary to demand. -Continue to monitor on telemetry   Right-sided pleural effusion:  Patient noted to have cardiomegaly with vascular congestion and small left-sided pleural effusion on imaging.  Patient did not appear to be grossly fluid overloaded on physical exam.  Oxygenating well on room air.  Continue volume management with HD.   ESRD on HD:  Patient had missed hemodialysis day prior to admission due to having significant pain, but labs appear to be relatively stable. --Continue hemodialysis per nephrology   Diabetes mellitus type 2:  Patient well controlled not on any medications for treatment.  Last hemoglobin A1c was 5.1 on 01/16/2021. --Continue renal and carb modified diet   History of MSSA bacteremia:  Patient had been treated and completed his 6 weeks of antibiotics from his previous hospitalization in June.  It appears he had no signs of endocarditis on TEE. --On cefazolin as above   Anemia chronic kidney  disease:  Hemoglobin 10 g/dL which appears improved from previous, but baseline appears to be 8 g/dL. --Continue to monitor   Subclinical hypothyroidism:  TSH 6.154, free T4 normal at 1.01.  Weakness/deconditioning/gait disturbance: --PT/OT following, currently recommending CIR on discharge   DVT prophylaxis: heparin injection 5,000 Units Start: 03/15/21 1400   Code Status: Full Code Family Communication: No family present at bedside this morning.  Disposition Plan:  Level of care: Telemetry Medical Status is: Inpatient  Remains inpatient appropriate because:Ongoing diagnostic testing needed not appropriate for outpatient work up, Unsafe d/c plan, IV treatments appropriate due to intensity of illness or inability to take PO, and Inpatient level of care appropriate due to severity of illness  Dispo: The patient is from: Home              Anticipated d/c is to: Home              Patient currently is not medically stable to d/c.   Difficult to place patient No   Consultants:  Neurosurgery Nephrology Infectious disease Interventional radiology  Procedures:  IR aspiration psoas abscess 8/8  Antimicrobials:  Ceftriaxone 8/4 - 8/6 Cefazolin 8/6>>   Subjective: Patient seen examined at bedside, resting comfortably.  Aided with video interpreter this morning.  Continues with low back pain.  Review of EMR shows only 2 doses of Percocet each last 2 days.  Discussed with patient that his pain medication can be taking more frequently as he states the  pain medication is effective at current dose.  Underwent IR aspiration left psoas abscess with 60 mL purulent material removed.  No other questions or concerns at this time. Denies headache, no visual changes, no chest pain, palpitations, no shortness of breath, no abdominal pain, no weakness, no fatigue, no paresthesias.  No acute events overnight per nursing staff.  Objective: Vitals:   03/19/21 1828 03/19/21 1850 03/19/21 2137  03/20/21 0811  BP: (!) 156/70 (!) 146/70 (!) 182/70 (!) 164/73  Pulse: 69 72    Resp:   18   Temp:   98.2 F (36.8 C)   TempSrc:   Oral   SpO2:      Weight:      Height:       No intake or output data in the 24 hours ending 03/20/21 [place]   03/14/21 2254 03/16/21 0815 03/19/21 0818  Weight: 69 kg 63.2 kg 60.1 kg    Examination:  General exam: Appears calm and comfortable, poor dentition Respiratory system: Clear to auscultation. Respiratory effort normal. Cardiovascular system: S1 & S2 heard, RRR. No JVD, murmurs, rubs, gallops or clicks. No pedal edema. Gastrointestinal system: Abdomen is nondistended, soft and nontender. No organomegaly or masses felt. Normal bowel sounds heard. Central nervous system: Alert and oriented. No focal neurological deficits. Extremities: Symmetric 5 x 5 power.  Neurovascular intact, sensation to light touch intact. Skin: No rashes, lesions or ulcers Psychiatry: Judgement and insight appear normal. Mood & affect appropriate.     Data Reviewed: I have personally reviewed following labs and imaging studies  CBC: Recent Labs  Lab 03/14/21 2259 03/16/21 0049 03/16/21 0832 03/17/21 0219 03/18/21 0349 03/19/21 0415 03/20/21 0324  WBC 15.1*   < > 11.8* 12.3* 12.9* 12.0* 11.5*  NEUTROABS 12.8*  --   --   --   --   --   --   HGB 10.0*   < > 9.5* 10.3* 10.6* 10.5* 11.4*  HCT 32.0*   < > 30.8* 34.0* 35.5* 35.0* 36.7*  MCV 95.8   < > 98.1 98.0 99.2 99.4 98.9  PLT 212   < > 177 193 209 214 227   < > = values in this interval not displayed.   Basic Metabolic Panel: Recent Labs  Lab 03/16/21 0049 03/17/21 0219 03/18/21 0349 03/19/21 0415 03/20/21 0324  NA 136 134* 133* 130* 133*  K 4.1 3.7 4.5 4.7 4.2  CL 91* 94* 92* 89* 94*  CO2 $Re'27 27 27 26 26  'hOF$ GLUCOSE 181* 178* 157* 180* 107*  BUN 78* 34* 50* 60* 25*  CREATININE 10.32* 5.94* 7.79* 9.22* 5.28*  CALCIUM 7.4* 7.7* 8.2* 8.5* 8.2*  PHOS 9.0* 4.0 4.2 5.0* 4.6    GFR: Estimated Creatinine Clearance: 10.6 mL/min (A) (by C-G formula based on SCr of 5.28 mg/dL (H)). Liver Function Tests: Recent Labs  Lab 03/14/21 2259 03/15/21 1150 03/16/21 0049 03/17/21 0219 03/18/21 0349 03/19/21 0415 03/20/21 0324  AST 21  --   --   --   --   --   --   ALT 9  --   --   --   --   --   --   ALKPHOS 107  --   --   --   --   --   --   BILITOT 1.2  --   --   --   --   --   --   PROT 7.1  --   --   --   --   --   --  ALBUMIN 3.4*   < > 2.5* 2.5* 2.4* 2.4* 2.5*   < > = values in this interval not displayed.   No results for input(s): LIPASE, AMYLASE in the last 168 hours. No results for input(s): AMMONIA in the last 168 hours. Coagulation Profile: Recent Labs  Lab 03/19/21 0415  INR 1.1   Cardiac Enzymes: No results for input(s): CKTOTAL, CKMB, CKMBINDEX, TROPONINI in the last 168 hours. BNP (last 3 results) No results for input(s): PROBNP in the last 8760 hours. HbA1C: No results for input(s): HGBA1C in the last 72 hours. CBG: No results for input(s): GLUCAP in the last 168 hours. Lipid Profile: No results for input(s): CHOL, HDL, LDLCALC, TRIG, CHOLHDL, LDLDIRECT in the last 72 hours. Thyroid Function Tests: No results for input(s): TSH, T4TOTAL, FREET4, T3FREE, THYROIDAB in the last 72 hours.  Anemia Panel: No results for input(s): VITAMINB12, FOLATE, FERRITIN, TIBC, IRON, RETICCTPCT in the last 72 hours. Sepsis Labs: No results for input(s): PROCALCITON, LATICACIDVEN in the last 168 hours.  Recent Results (from the past 240 hour(s))  Resp Panel by RT-PCR (Flu A&B, Covid) Nasopharyngeal Swab     Status: None   Collection Time: 03/15/21  2:08 AM   Specimen: Nasopharyngeal Swab; Nasopharyngeal(NP) swabs in vial transport medium  Result Value Ref Range Status   SARS Coronavirus 2 by RT PCR NEGATIVE NEGATIVE Final    Comment: (NOTE) SARS-CoV-2 target nucleic acids are NOT DETECTED.  The SARS-CoV-2 RNA is generally detectable in upper  respiratory specimens during the acute phase of infection. The lowest concentration of SARS-CoV-2 viral copies this assay can detect is 138 copies/mL. A negative result does not preclude SARS-Cov-2 infection and should not be used as the sole basis for treatment or other patient management decisions. A negative result may occur with  improper specimen collection/handling, submission of specimen other than nasopharyngeal swab, presence of viral mutation(s) within the areas targeted by this assay, and inadequate number of viral copies(<138 copies/mL). A negative result must be combined with clinical observations, patient history, and epidemiological information. The expected result is Negative.  Fact Sheet for Patients:  EntrepreneurPulse.com.au  Fact Sheet for Healthcare Providers:  IncredibleEmployment.be  This test is no t yet approved or cleared by the [place] FDA and  has been authorized for detection and/or diagnosis of SARS-CoV-2 by FDA under an Emergency Use Authorization (EUA). This EUA will remain  in effect (meaning this test can be used) for the duration of the COVID-19 declaration under Section 564(b)(1) of the Act, 21 U.S.C.section 360bbb-3(b)(1), unless the authorization is terminated  or revoked sooner.       Influenza A by PCR NEGATIVE NEGATIVE Final   Influenza B by PCR NEGATIVE NEGATIVE Final    Comment: (NOTE) The Xpert Xpress SARS-CoV-2/FLU/RSV plus assay is intended as an aid in the diagnosis of influenza from Nasopharyngeal swab specimens and should not be used as a sole basis for treatment. Nasal washings and aspirates are unacceptable for Xpert Xpress SARS-CoV-2/FLU/RSV testing.  Fact Sheet for Patients: EntrepreneurPulse.com.au  Fact Sheet for Healthcare Providers: IncredibleEmployment.be  This test is not yet approved or cleared by the [place] FDA and has been  authorized for detection and/or diagnosis of SARS-CoV-2 by FDA under an Emergency Use Authorization (EUA). This EUA will remain in effect (meaning this test can be used) for the duration of the COVID-19 declaration under Section 564(b)(1) of the Act, 21 U.S.C. section 360bbb-3(b)(1), unless the authorization is terminated or revoked.  Performed at [facility], [address], [place], [place] [address]   Culture, blood (routine x 2)     Status: None   Collection Time: 03/15/21  3:39 PM   Specimen: BLOOD  Result Value Ref Range Status   Specimen Description BLOOD RIGHT ANTECUBITAL  Final   Special Requests   Final    BOTTLES DRAWN AEROBIC AND ANAEROBIC Blood Culture results may not be optimal due to an inadequate volume of blood received in culture bottles   Culture   Final    NO GROWTH 5 DAYS Performed at [facility], [place] [address] [place], [place] [address]    Report Status 03/20/2021 FINAL  Final  Culture, blood (routine x 2)     Status: None   Collection Time: 03/15/21  3:47 PM   Specimen: BLOOD RIGHT HAND  Result Value Ref Range Status   Specimen Description BLOOD RIGHT HAND  Final   Special Requests   Final    BOTTLES DRAWN AEROBIC AND ANAEROBIC Blood Culture adequate volume   Culture   Final    NO GROWTH 5 DAYS Performed at [facility], [place] [address] [place], [place] [address]    Report Status 03/20/2021 FINAL  Final  Aerobic/Anaerobic Culture w Gram Stain (surgical/deep wound)     Status: None (Preliminary result)   Collection Time: 03/19/21  5:09 PM   Specimen: Abscess  Result Value Ref Range Status   Specimen Description ABSCESS  Final   Special Requests LEFT PSOAS  Final   Gram Stain   Final    ABUNDANT WBC PRESENT,BOTH PMN AND MONONUCLEAR FEW GRAM POSITIVE COCCI    Culture   Final    CULTURE REINCUBATED FOR BETTER GROWTH Performed at [facility], [place] [address] [place], [place] [address]    Report Status PENDING   Incomplete         Radiology Studies: CT ASPIRATION  Result Date: 03/19/2021 INDICATION: 65 year old male with osteomyelitis discitis of L5-S1, and small abscess on prior imaging referred for aspiration/possible drainage. EXAM: CT-GUIDED ASPIRATION OF LEFT PSOAS ABSCESS MEDICATIONS: NONE ANESTHESIA/SEDATION: Fentanyl 50 mcg IV; Versed 1.0 mg IV Moderate Sedation Time:  11 minutes The patient was continuously monitored during the procedure by the interventional radiology nurse under my direct supervision. COMPLICATIONS: None PROCEDURE: Informed written consent was obtained from the patient after a thorough discussion of the procedural risks, benefits and alternatives. All questions were addressed. Maximal Sterile Barrier Technique was utilized including caps, mask, sterile gowns, sterile gloves, sterile drape, hand hygiene and skin antiseptic. A timeout was performed prior to the initiation of the procedure. Patient positioned supine position on CT gantry table and scout CT was acquired for planning purposes. The patient was then prepped and draped in the usual sterile fashion. 1% lidocaine was used for local anesthesia. Using CT guidance, a Yueh needle was advanced into the site of the psoas abscess of the left psoas muscle, via a trans mesenteric approach, anteriorly. With once we confirmed needle tip position, we aspirated approximately 6 cc of purulent material. Culture was sent. Needle was removed and a final CT was acquired. Patient tolerated the procedure well and remained hemodynamically stable throughout. No complications were encountered and no significant blood loss. IMPRESSION: Status post CT-guided aspirate of small left psoas abscess for a culture. Signed, Dulcy Fanny. Dellia Nims, RPVI [facility] Electronically Signed   By: Corrie Mckusick D.O.   On: 03/19/2021 17:32  Scheduled Meds:  Chlorhexidine Gluconate Cloth  6 each  Topical Q0600   darbepoetin (ARANESP) injection - DIALYSIS  100 mcg Intravenous Q Fri-HD   doxercalciferol  3 mcg Intravenous Q M,W,F-HD   feeding supplement (NEPRO CARB STEADY)  237 mL Oral BID BM   gabapentin  100 mg Oral QHS   heparin  5,000 Units Subcutaneous Q8H   midodrine  10 mg Oral TID WC   multivitamin  1 tablet Oral QHS   sodium chloride flush  3 mL Intravenous Q12H   sucroferric oxyhydroxide  1,000 mg Oral TID WC   Continuous Infusions:   ceFAZolin (ANCEF) IV 1 g (03/19/21 2144)     LOS: 5 days    Time spent: 39 minutes spent on chart review, discussion with nursing staff, consultants, updating family and interview/physical exam; more than 50% of that time was spent in counseling and/or coordination of care.    Miriam Kestler J [place] DO [facility] Available via Epic secure chat 7am-7pm After these hours, please refer to coverage provider listed on [email or web address] 03/20/2021, 10:14 AM

## 2021-03-20 NOTE — Progress Notes (Addendum)
ID Brief Note  Cultures 04/19/21  Component 1 d ago   Specimen Description ABSCESS   Special Requests LEFT PSOAS   Gram Stain ABUNDANT WBC PRESENT,BOTH PMN AND MONONUCLEAR  FEW GRAM POSITIVE COCCI   Culture FEW STAPHYLOCOCCUS AUREUS  SUSCEPTIBILITIES TO FOLLOW    Most likely MSSA given h/o MSSA bacteremia recently Continue cefazolin as is  Blood cx 03/15/21 2/2 sets no growth in 5 days  Will pan for 6-8 weeks of antibiotics  Following cultures for final recommendations  Rosiland Oz, MD Infectious Disease Physician [facility] [address] [place], [place] [address] Phone: [phone]  Fax: [phone]

## 2021-03-20 NOTE — Plan of Care (Signed)
?  Problem: Elimination: ?Goal: Will not experience complications related to bowel motility ?Outcome: Progressing ?  ?Problem: Pain Managment: ?Goal: General experience of comfort will improve ?Outcome: Progressing ?  ?Problem: Safety: ?Goal: Ability to remain free from injury will improve ?Outcome: Progressing ?  ?

## 2021-03-20 NOTE — Progress Notes (Addendum)
Occupational Therapy Treatment Patient Details Name: Ryan Wells MRN: [id number] DOB: Nov 24, 1955 Today's Date: 03/20/2021    History of present illness Pt is 65 y.o. male presenting to ED on 8/3 with severe back/leg pain x2 weeks and diaphoresis.  Pt admitted with lumbar sacral osteomyelitis/discitis with septic arthritis L5-S1 and bil psoas abscess, R sided pleural effusion. . 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 At time of previous admission MRI (+) L5-S1 facet osteoarthritis, synovial cysts and periarticular soft tissue edema on the left. 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 Returned to ED 7/25 c/o radicular low back pain.  PMHx significant for ESRD on HD, HTN, DMII, chornic anemia and MSSA.   OT comments  [employer] interpreter "Jesus" used during session. Pt premedicated. Able to progress to EOB and stand, taking several steps toward L @ RW level and Mod A +2. Pt in significant pain, however improved from last session. Encouraged pt ot complete ADL and exercise @ bed level. At this time continue to recommend rehab at [facility].will continue to follow.  Pt very motivated to progress and very appreciative of help from therapists.   Follow Up Recommendations  Supervision/Assistance - 24 hour;CIR;Other (comment)    Equipment Recommendations  3 in 1 bedside commode    Recommendations for Other Services Rehab consult    Precautions / Restrictions Precautions Precautions: Fall Precaution Comments: Back pain - back precautions for comfort       Mobility Bed Mobility Overal bed mobility: Needs Assistance Bed Mobility: Rolling;Sidelying to Sit;Sit to Supine Rolling: Mod assist Sidelying to sit: Mod assist   Sit to supine: Mod assist;+2 for physical assistance Sit to sidelying: Max assist;+2 for physical assistance General bed mobility comments: Pt wanting to lie down toward R side; May benefit from getting  in/out of bed on R side    Transfers Overall transfer level: Needs assistance Equipment used: Rolling walker (2 wheeled) Transfers: Sit to/from Stand Sit to Stand: Mod assist;+2 physical assistance         General transfer comment: Mod x 2 to stand with cues for use of R LE more than L LE due to pain; pt not following cues for hand placement due to pain; able to side step toward L several steps    Balance Overall balance assessment: Needs assistance Sitting-balance support: Bilateral upper extremity supported;Feet supported Sitting balance-Leahy Scale: Poor Sitting balance - Comments: Leaning posteriorly and R to offload L side.  Requiring mod A for balance. Sat EOB for at least 5 mins.     Standing balance-Leahy Scale: Poor Standing balance comment: Requiring RW and assist of 2                           ADL either performed or assessed with clinical judgement   ADL Overall ADL's : Needs assistance/impaired Eating/Feeding: Modified independent   Grooming: Set up;Bed level   Upper Body Bathing: Set up;Bed level   Lower Body Bathing: Moderate assistance;Bed level   Upper Body Dressing : Minimal assistance;Sitting   Lower Body Dressing: Maximal assistance   Toilet Transfer: Moderate assistance;+2 for safety/equipment   Toileting- Clothing Manipulation and Hygiene: Moderate assistance;Sitting/lateral lean       Functional mobility during ADLs: Moderate assistance;+2 for physical assistance;Rolling walker General ADL Comments: significantly limited by pain     Vision       Perception     Praxis  Cognition Arousal/Alertness: Awake/alert Behavior During Therapy: WFL for tasks assessed/performed;Anxious Overall Cognitive Status: No family/caregiver present to determine baseline cognitive functioning                                 General Comments: increased time for processing; most likely affected by pain        Exercises  Encouraged general UE AROM through full ROM  Shoulder Instructions       General Comments Educated on importance of sittingupright as tolerated At bed level in addition to general AROM in bed with BUE/LE. Also educated pt on need for him to complete ADL @ bed level as much as possible. Pt verbalized understanding.    Pertinent Vitals/ Pain       Pain Assessment: 0-10 Pain Score: 8  Faces Pain Scale: Hurts worst (10 with activity; 2 in supine) Pain Location: back Pain Descriptors / Indicators: Discomfort;Sharp;Shooting;Guarding;Grimacing;Moaning Pain Intervention(s): Limited activity within patient's tolerance;Premedicated before session;Repositioned  Home Living                                          Prior Functioning/Environment              Frequency  Min 2X/week        Progress Toward Goals  OT Goals(current goals can now be found in the care plan section)  Progress towards OT goals: Progressing toward goals  Acute Rehab OT Goals Patient Stated Goal: to be able to do for myself OT Goal Formulation: With patient Time For Goal Achievement: 03/31/21 Potential to Achieve Goals: Good ADL Goals Pt Will Perform Upper Body Dressing: with supervision;sitting Pt Will Perform Lower Body Dressing: with supervision;sit to/from stand;with adaptive equipment Pt Will Transfer to Toilet: with supervision;bedside commode Pt Will Perform Toileting - Clothing Manipulation and hygiene: with supervision;sit to/from stand  Plan Discharge plan remains appropriate    Co-evaluation    PT/OT/SLP Co-Evaluation/Treatment: Yes Reason for Co-Treatment: For patient/therapist safety;Other (comment) (for pain control) PT goals addressed during session: Mobility/safety with mobility;Strengthening/ROM OT goals addressed during session: ADL's and self-care      AM-PAC OT "6 Clicks" Daily Activity     Outcome Measure   Help from another person eating meals?: None Help  from another person taking care of personal grooming?: A Little Help from another person toileting, which includes using toliet, bedpan, or urinal?: A Lot Help from another person bathing (including washing, rinsing, drying)?: A Lot Help from another person to put on and taking off regular upper body clothing?: A Little Help from another person to put on and taking off regular lower body clothing?: A Lot 6 Click Score: 16    End of Session Equipment Utilized During Treatment: Gait belt;Rolling walker  OT Visit Diagnosis: Unsteadiness on feet (R26.81);Repeated falls (R29.6);Muscle weakness (generalized) (M62.81);Pain Pain - part of body:  (back)   Activity Tolerance Patient limited by pain   Patient Left in bed;with call bell/phone within reach;with bed alarm set;Other (comment) (chair position)   Nurse Communication Mobility status        Time: [id number] OT Time Calculation (min): 16 min  Charges: OT General Charges $OT Visit: 1 Visit OT Treatments $Self Care/Home Management : 8-22 mins  Maurie Boettcher, OT/L   Acute OT Clinical Specialist [place] Pager [phone] Office [phone]  Adriane Guglielmo,HILLARY 03/20/2021, 4:11 PM

## 2021-03-20 NOTE — Progress Notes (Addendum)
[facility] Progress Note   Subjective: Seen in room. No C/Os. HD tomorrow on schedule.    Objective Vitals:   03/19/21 1828 03/19/21 1850 03/19/21 2137 03/20/21 0811  BP: (!) 156/70 (!) 146/70 (!) 182/70 (!) 164/73  Pulse: 69 72    Resp:   18   Temp:   98.2 F (36.8 C)   TempSrc:   Oral   SpO2:      Weight:      Height:       Physical Exam General: Pleasant older male in NAD Heart: S1,S2 RRR Lungs: CTAB Abdomen:NABS Extremities: No LE edema Dialysis Access: L AVF +T/B   Additional Objective Labs: Basic Metabolic Panel: Recent Labs  Lab 03/18/21 0349 03/19/21 0415 03/20/21 0324  NA 133* 130* 133*  K 4.5 4.7 4.2  CL 92* 89* 94*  CO2 '27 26 26  '$ GLUCOSE 157* 180* 107*  BUN 50* 60* 25*  CREATININE 7.79* 9.22* 5.28*  CALCIUM 8.2* 8.5* 8.2*  PHOS 4.2 5.0* 4.6   Liver Function Tests: Recent Labs  Lab 03/14/21 2259 03/15/21 1150 03/18/21 0349 03/19/21 0415 03/20/21 0324  AST 21  --   --   --   --   ALT 9  --   --   --   --   ALKPHOS 107  --   --   --   --   BILITOT 1.2  --   --   --   --   PROT 7.1  --   --   --   --   ALBUMIN 3.4*   < > 2.4* 2.4* 2.5*   < > = values in this interval not displayed.   No results for input(s): LIPASE, AMYLASE in the last 168 hours. CBC: Recent Labs  Lab 03/14/21 2259 03/16/21 0049 03/16/21 0832 03/17/21 0219 03/18/21 0349 03/19/21 0415 03/20/21 0324  WBC 15.1*   < > 11.8* 12.3* 12.9* 12.0* 11.5*  NEUTROABS 12.8*  --   --   --   --   --   --   HGB 10.0*   < > 9.5* 10.3* 10.6* 10.5* 11.4*  HCT 32.0*   < > 30.8* 34.0* 35.5* 35.0* 36.7*  MCV 95.8   < > 98.1 98.0 99.2 99.4 98.9  PLT 212   < > 177 193 209 214 227   < > = values in this interval not displayed.   Blood Culture    Component Value Date/Time   SDES ABSCESS 03/19/2021 1709   SPECREQUEST LEFT PSOAS 03/19/2021 1709   CULT  03/19/2021 1709    FEW STAPHYLOCOCCUS AUREUS SUSCEPTIBILITIES TO FOLLOW Performed at [facility], [place] [address] [place], [place] [address]    REPTSTATUS PENDING 03/19/2021 1709    Cardiac Enzymes: No results for input(s): CKTOTAL, CKMB, CKMBINDEX, TROPONINI in the last 168 hours. CBG: No results for input(s): GLUCAP in the last 168 hours. Iron Studies: No results for input(s): IRON, TIBC, TRANSFERRIN, FERRITIN in the last 72 hours. '@lablastinr3'$ @ Studies/Results: CT ASPIRATION  Result Date: 03/19/2021 INDICATION: 65 year old male with osteomyelitis discitis of L5-S1, and small abscess on prior imaging referred for aspiration/possible drainage. EXAM: CT-GUIDED ASPIRATION OF LEFT PSOAS ABSCESS MEDICATIONS: NONE ANESTHESIA/SEDATION: Fentanyl 50 mcg IV; Versed 1.0 mg IV Moderate Sedation Time:  11 minutes The patient was continuously monitored during the procedure by the interventional radiology nurse under my direct supervision. COMPLICATIONS: None PROCEDURE: Informed written consent was obtained from the patient after a thorough discussion of  the procedural risks, benefits and alternatives. All questions were addressed. Maximal Sterile Barrier Technique was utilized including caps, mask, sterile gowns, sterile gloves, sterile drape, hand hygiene and skin antiseptic. A timeout was performed prior to the initiation of the procedure. Patient positioned supine position on CT gantry table and scout CT was acquired for planning purposes. The patient was then prepped and draped in the usual sterile fashion. 1% lidocaine was used for local anesthesia. Using CT guidance, a Yueh needle was advanced into the site of the psoas abscess of the left psoas muscle, via a trans mesenteric approach, anteriorly. With once we confirmed needle tip position, we aspirated approximately 6 cc of purulent material. Culture was sent. Needle was removed and a final CT was acquired. Patient tolerated the procedure well and remained hemodynamically stable throughout. No complications were encountered and no significant blood loss.  IMPRESSION: Status post CT-guided aspirate of small left psoas abscess for a culture. Signed, Dulcy Fanny. Dellia Nims, RPVI [facility] Electronically Signed   By: Corrie Mckusick D.O.   On: 03/19/2021 17:32   Medications:   ceFAZolin (ANCEF) IV 1 g (03/19/21 2144)    Chlorhexidine Gluconate Cloth  6 each Topical Q0600   darbepoetin (ARANESP) injection - DIALYSIS  100 mcg Intravenous Q Fri-HD   doxercalciferol  3 mcg Intravenous Q M,W,F-HD   feeding supplement (NEPRO CARB STEADY)  237 mL Oral BID BM   gabapentin  100 mg Oral QHS   heparin  5,000 Units Subcutaneous Q8H   midodrine  10 mg Oral TID WC   multivitamin  1 tablet Oral QHS   sodium chloride flush  3 mL Intravenous Q12H   sucroferric oxyhydroxide  1,000 mg Oral TID WC     Dialysis Orders:[facility] M[facility]  3h 28mn  180NRe 400/600  64kg   2/2 bath  P4  AVF   -Heparin  2200 units IV TIW -Mircera 150 mcg IV q 2 week (last dose 150 mcg IV 02/28/2021) -Venofer 100 mg IV X 10 doses (4/6 doses given.  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  Neurosurgery evaluated and no intervention deemed necessary.  Per ID input, will change to cefazolin (as likely MSSA given h/o MSSA bacteremia recently).  IR consulted for drainage of psoas abscess for GS, cx, and sensitivity.   Pulmonary edema: Vascular congestion, small R pleural effusion on CXR but appears mild in comparison to CXR 01/2021. He has not been eating well, has most likely lost body wt. No acute dialysis needs today. No evidence of overt volume overload.   Elevated troponin: In setting of vascular congestion and ESRD. Troponin 84, 101 so far. Denies chest pain. Per primary L face contusion-supportive care per primary  ESRD - Next HD 03/21/2021.  At present time he is not able to sit up in recliner without significant discomfort.   He may need rehab before he can be discharged to home for outpatient dialysis.  Hypertension/volume  - As noted above. BP well controlled. On midodrine 10 mg PO TID. No evidence of overt volume overload by exam on admit. Now very much under EDW. Will need EDW reset on discharge. UF as tolerated.   Anemia  - HGB 10.5.  Missed OP ESA 08/03. Give today with HD. Stop Fe load in setting of infection.   Metabolic bone disease - Continue velphoro 500 mg 2 tabs PO  TID AC. Continue VDRA. Labs at goal.   Nutrition - Renal carb mod diet, nepro, renal vits.  DM-No meds on OP list. Last HA1c 6.9. per primary Disposition - will likely need SNF given severe debilitation from back pain and inability to sit up/ambulate. Does he have resources for this care? Consult MSW.  Rita H. Brown NP-C 03/20/2021, 11:21 AM  [facility] [phone]  Pt seen, examined and agree w A/P as above.  Kelly Splinter  MD 03/20/2021, 4:45 PM

## 2021-03-20 NOTE — Progress Notes (Signed)
Physical Therapy Treatment Patient Details Name: Ryan Wells MRN: [id number] DOB: 1956-04-17 Today's Date: 03/20/2021    History of Present Illness Pt is 65 y.o. male presenting to ED on 8/3 with severe back/leg pain x2 weeks and diaphoresis.  Pt admitted with lumbar sacral osteomyelitis/discitis with septic arthritis L5-S1 and bil psoas abscess, R sided pleural effusion. Noted plan for aspiration of L psoas fluid collection on 8/8 but has not been done at this time. 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 At time of previous admission MRI (+) L5-S1 facet osteoarthritis, synovial cysts and periarticular soft tissue edema on the left. 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 Returned to ED 7/25 c/o radicular low back pain. Neurosurgery recommended short course of steroids and to follow up in office. Patient does not appear to have done this. Patient also reports inability to participate with outpt PT services secondary to pain.  PMHx significant for ESRD on HD, HTN, DMII, chornic anemia and MSSA.    PT Comments    Pt with some improvement but still significantly limited due to pain.  Increased time for transfers/session due to pain and time for interpretation.  Able to transfer to EOB with mod A and sat at least 5 mins with mod A.  Was able to stand with mod A x 2. Good participation with supine exercises with cues. Pt did receive IV pain meds prior to session that potentially helped with improved tolerance for therapy.     Follow Up Recommendations  CIR     Equipment Recommendations  Other (comment) (further assessment next venue or as pain improves)    Recommendations for Other Services Rehab consult     Precautions / Restrictions Precautions Precautions: Fall Precaution Comments: Back pain - back precautions for comfort    Mobility  Bed Mobility Overal bed mobility: Needs Assistance Bed Mobility:  Rolling;Sidelying to Sit;Sit to Supine Rolling: Mod assist Sidelying to sit: Mod assist   Sit to supine: Mod assist;+2 for physical assistance   General bed mobility comments: Increased time for all with cues for log roll technique to sit; "helicopter" transfer back to supine due to pain    Transfers Overall transfer level: Needs assistance Equipment used: Rolling walker (2 wheeled) Transfers: Sit to/from Stand Sit to Stand: Mod assist;+2 physical assistance         General transfer comment: Mod x 2 to stand with cues for use of R LE more than L LE due to pain; pt not following cues for hand placement due to pain  Ambulation/Gait Ambulation/Gait assistance: Mod assist;+2 safety/equipment Gait Distance (Feet): 2 Feet Assistive device: Rolling walker (2 wheeled) Gait Pattern/deviations: Step-to pattern Gait velocity: decreased   General Gait Details: side steps toward [facility]; further ambulation limited due to pain   Stairs             Wheelchair Mobility    Modified Rankin (Stroke Patients Only)       Balance Overall balance assessment: Needs assistance Sitting-balance support: Bilateral upper extremity supported;Feet supported Sitting balance-Leahy Scale: Poor Sitting balance - Comments: Leaning posteriorly and R to offload L side.  Requiring mod A for balance. Sat EOB for at least 5 mins.     Standing balance-Leahy Scale: Poor Standing balance comment: Requiring RW and assist of 2                            Cognition  Arousal/Alertness: Awake/alert Behavior During Therapy: WFL for tasks assessed/performed Overall Cognitive Status: No family/caregiver present to determine baseline cognitive functioning                                 General Comments: No family to determine baseline but pt requiring increased time to respond to interpretor; distracted due to pain      Exercises General Exercises - Lower Extremity Ankle  Circles/Pumps: AROM;Both;10 reps;Supine Short Arc Quad: AROM;Both;5 reps;Supine Heel Slides: AROM;Both;10 reps;Supine    General Comments General comments (skin integrity, edema, etc.): Educated on importance of activity and encouraged AROM in bed      Pertinent Vitals/Pain Pain Assessment: Faces Faces Pain Scale: Hurts worst (10 with activity; 2 in supine) Pain Location: Back - did not specify specific area Pain Descriptors / Indicators: Discomfort;Sharp;Shooting;Guarding;Grimacing Pain Intervention(s): Limited activity within patient's tolerance;Monitored during session;Premedicated before session (Pt received IV pain meds prior to session)    Home Living                      Prior Function            PT Goals (current goals can now be found in the care plan section) Acute Rehab PT Goals Patient Stated Goal: to be able to do for myself PT Goal Formulation: With patient Time For Goal Achievement: 03/31/21 Potential to Achieve Goals: Fair Progress towards PT goals: Progressing toward goals    Frequency    Min 4X/week      PT Plan Current plan remains appropriate    Co-evaluation PT/OT/SLP Co-Evaluation/Treatment: Yes Reason for Co-Treatment: For patient/therapist safety;Other (comment) (for pain control) PT goals addressed during session: Mobility/safety with mobility;Strengthening/ROM OT goals addressed during session: Strengthening/ROM      AM-PAC PT "6 Clicks" Mobility   Outcome Measure  Help needed turning from your back to your side while in a flat bed without using bedrails?: A Lot Help needed moving from lying on your back to sitting on the side of a flat bed without using bedrails?: A Lot Help needed moving to and from a bed to a chair (including a wheelchair)?: Total Help needed standing up from a chair using your arms (e.g., wheelchair or bedside chair)?: Total Help needed to walk in hospital room?: Total Help needed climbing 3-5 steps with a  railing? : Total 6 Click Score: 8    End of Session Equipment Utilized During Treatment: Gait belt Activity Tolerance: Patient limited by pain Patient left: in bed;with call bell/phone within reach;with bed alarm set (chair position) Nurse Communication: Mobility status PT Visit Diagnosis: Difficulty in walking, not elsewhere classified (R26.2);Pain     Time: [id number] PT Time Calculation (min) (ACUTE ONLY): 40 min  Charges:  $Therapeutic Exercise: 8-22 mins $Therapeutic Activity: 8-22 mins                     Abran Richard, PT Acute Rehab Services Pager [phone] [facility] [place] 03/20/2021, 1:27 PM

## 2021-03-21 DIAGNOSIS — R778 Other specified abnormalities of plasma proteins: Secondary | ICD-10-CM | POA: Diagnosis not present

## 2021-03-21 DIAGNOSIS — N186 End stage renal disease: Secondary | ICD-10-CM | POA: Diagnosis not present

## 2021-03-21 DIAGNOSIS — M545 Low back pain, unspecified: Secondary | ICD-10-CM | POA: Diagnosis not present

## 2021-03-21 DIAGNOSIS — E119 Type 2 diabetes mellitus without complications: Secondary | ICD-10-CM | POA: Diagnosis not present

## 2021-03-21 LAB — CBC
HCT: 34.4 % — ABNORMAL LOW (ref 39.0–52.0)
Hemoglobin: 10.9 g/dL — ABNORMAL LOW (ref 13.0–17.0)
MCH: 29.8 pg (ref 26.0–34.0)
MCHC: 31.7 g/dL (ref 30.0–36.0)
MCV: 94 fL (ref 80.0–100.0)
Platelets: 206 10*3/uL (ref 150–400)
RBC: 3.66 MIL/uL — ABNORMAL LOW (ref 4.22–5.81)
RDW: 17.2 % — ABNORMAL HIGH (ref 11.5–15.5)
WBC: 14.2 10*3/uL — ABNORMAL HIGH (ref 4.0–10.5)
nRBC: 0 % (ref 0.0–0.2)

## 2021-03-21 LAB — RENAL FUNCTION PANEL
Albumin: 2.3 g/dL — ABNORMAL LOW (ref 3.5–5.0)
Anion gap: 15 (ref 5–15)
BUN: 40 mg/dL — ABNORMAL HIGH (ref 8–23)
CO2: 22 mmol/L (ref 22–32)
Calcium: 8.5 mg/dL — ABNORMAL LOW (ref 8.9–10.3)
Chloride: 93 mmol/L — ABNORMAL LOW (ref 98–111)
Creatinine, Ser: 7.36 mg/dL — ABNORMAL HIGH (ref 0.61–1.24)
GFR, Estimated: 8 mL/min — ABNORMAL LOW (ref >60)
Glucose, Bld: 107 mg/dL — ABNORMAL HIGH (ref 70–99)
Phosphorus: 5 mg/dL — ABNORMAL HIGH (ref 2.5–4.6)
Potassium: 5.8 mmol/L — ABNORMAL HIGH (ref 3.5–5.1)
Sodium: 130 mmol/L — ABNORMAL LOW (ref 135–145)

## 2021-03-21 MED ORDER — SODIUM ZIRCONIUM CYCLOSILICATE 10 G PO PACK
10.0000 g | PACK | Freq: Two times a day (BID) | ORAL | Status: AC
  Administered 2021-03-21 (×2): 10 g via ORAL
  Filled 2021-03-21 (×2): qty 1

## 2021-03-21 MED ORDER — MIDODRINE HCL 5 MG PO TABS: 10.0000 mg | ORAL_TABLET | ORAL | Status: DC

## 2021-03-21 MED ORDER — MIDODRINE HCL 5 MG PO TABS
10.0000 mg | ORAL_TABLET | ORAL | Status: DC | PRN
  Administered 2021-03-22: 10 mg via ORAL

## 2021-03-21 NOTE — Progress Notes (Addendum)
Inpatient Rehabilitation Admissions Coordinator   Patient not yet at a level to tolerate the intensity required of a CIR admit. I recommend Nursing and therapy coordination to assist with pain management to increase time able to sit in recliner. SNF may be required and he will have to be able to sit for outpatient dialysis to go SNF. I will continue to follow.  I have alerted acute team and TOC of my recommendation.  Danne Baxter, RN, MSN Rehab Admissions Coordinator [phone] 03/21/2021 3:27 PM

## 2021-03-21 NOTE — Progress Notes (Signed)
[facility] Infectious Diseases Follow Up Note  Patient Identification: Patient Name: Ryan Wells MRN: [id number] Admit Date: 03/14/2021 11:29 PM Age: 65 y.o.Today's Date: 03/21/2021   Reason for Visit: Discitis and osteomyelitis  Principal Problem:   Lumbar back pain Active Problems:   Subclinical hypothyroidism   Anemia due to chronic kidney disease   Diabetes mellitus type II, non insulin dependent (HCC)   ESRD on hemodialysis (HCC)   Pleural effusion   Elevated troponin   Leg pain   Leukocytosis   History of bacteremia  Antibiotics: Ceftriaxone 8/3, cefazolin 8/7-current    Lines/Tubes: PIV's, left upper arm AV fistula/graft, right IJ tunneled hemodialysis catheter  Interval Events: Continues to be afebrile, mild leukocytosis 14.2, left psoas abscess cultures is growing MSSA   Assessment L5-S1 discitis and osteomyelitis Left psoas abscess(4.7 cm) Possible left L5-S1 septic arthritis Severe degenerative disc disease with severe bilateral neural foraminal narrowing L5-S1 Neurosurgery consulted-no plans for surgical intervention S/p CT-guided drainage of left psoas abscess.  Cultures are growing MSSA in day 2.   5.  Recent history of MSSA bacteremia with septic pulmonary emboli/possible endocarditis status post 6 weeks of cefazolin  Others ESRD on hemodialysis with left upper extremity AV fistula DM  Recommendations Continue cefazolin with HD, plan for 6 to 8 weeks course. Tentative end date is 04/30/21 Monitor CBC BMP ESR and CRP weekly ID pharmacy to place OPAT orders A follow-up with [facility] will be made upon discharge for monitoring IV abtx and possible need for fu imaging  Will follow cultures peripherally for any new updates.  Otherwise I will sign off for now.   Plan discussed with patient/ID pharmacy and Primary  Rest of the management as per the primary team. Thank you for the consult. Please page  with pertinent questions or concerns.  ______________________________________________________________________ Subjective patient seen and examined at the bedside.  Spoke with patient with the help of video interpreter.  He started to work with PT. back pain is stable.  Denies any fever, chills and sweats.  Denies any nausea, vomiting and diarrhea.   Vitals BP 133/68 (BP Location: Right Arm)   Pulse 69   Temp 99.5 F (37.5 C) (Oral)   Resp 18   Ht 5\' 2"  (1.575 m)   Wt 60.1 kg   SpO2 99%   BMI 24.23 kg/m     Physical Exam Constitutional: Lying in bed, not in acute distress    Comments:   Cardiovascular:     Rate and Rhythm: Normal rate and regular rhythm.     Heart sounds:   Pulmonary:     Effort: Pulmonary effort is normal.     Comments: On room air  Abdominal:     Palpations: Abdomen is soft.     Tenderness: Nontender and nondistended  Musculoskeletal:        General: No swelling or tenderness.   Skin:    Comments: No lesions or rashes  Neurological:     General: grossly non focal, lower lumbar tenderness  Psychiatric:        Mood and Affect: Mood normal. Calm and cooperative   Pertinent Microbiology Results for orders placed or performed during the hospital encounter of 03/14/21  Resp Panel by RT-PCR (Flu A&B, Covid) Nasopharyngeal Swab     Status: None   Collection Time: 03/15/21  2:08 AM   Specimen: Nasopharyngeal Swab; Nasopharyngeal(NP) swabs in vial transport medium  Result Value Ref Range Status   SARS Coronavirus 2 by RT PCR NEGATIVE NEGATIVE  Final    Comment: (NOTE) SARS-CoV-2 target nucleic acids are NOT DETECTED.  The SARS-CoV-2 RNA is generally detectable in upper respiratory specimens during the acute phase of infection. The lowest concentration of SARS-CoV-2 viral copies this assay can detect is 138 copies/mL. A negative result does not preclude SARS-Cov-2 infection and should not be used as the sole basis for treatment or other patient  management decisions. A negative result may occur with  improper specimen collection/handling, submission of specimen other than nasopharyngeal swab, presence of viral mutation(s) within the areas targeted by this assay, and inadequate number of viral copies(<138 copies/mL). A negative result must be combined with clinical observations, patient history, and epidemiological information. The expected result is Negative.  Fact Sheet for Patients:  EntrepreneurPulse.com.au  Fact Sheet for Healthcare Providers:  IncredibleEmployment.be  This test is no t yet approved or cleared by the [place] FDA and  has been authorized for detection and/or diagnosis of SARS-CoV-2 by FDA under an Emergency Use Authorization (EUA). This EUA will remain  in effect (meaning this test can be used) for the duration of the COVID-19 declaration under Section 564(b)(1) of the Act, 21 U.S.C.section 360bbb-3(b)(1), unless the authorization is terminated  or revoked sooner.       Influenza A by PCR NEGATIVE NEGATIVE Final   Influenza B by PCR NEGATIVE NEGATIVE Final    Comment: (NOTE) The Xpert Xpress SARS-CoV-2/FLU/RSV plus assay is intended as an aid in the diagnosis of influenza from Nasopharyngeal swab specimens and should not be used as a sole basis for treatment. Nasal washings and aspirates are unacceptable for Xpert Xpress SARS-CoV-2/FLU/RSV testing.  Fact Sheet for Patients: EntrepreneurPulse.com.au  Fact Sheet for Healthcare Providers: IncredibleEmployment.be  This test is not yet approved or cleared by the [place] FDA and has been authorized for detection and/or diagnosis of SARS-CoV-2 by FDA under an Emergency Use Authorization (EUA). This EUA will remain in effect (meaning this test can be used) for the duration of the COVID-19 declaration under Section 564(b)(1) of the Act, 21 U.S.C. section 360bbb-3(b)(1),  unless the authorization is terminated or revoked.  Performed at [facility], [address], [place], [place] [address]   Culture, blood (routine x 2)     Status: None   Collection Time: 03/15/21  3:39 PM   Specimen: BLOOD  Result Value Ref Range Status   Specimen Description BLOOD RIGHT ANTECUBITAL  Final   Special Requests   Final    BOTTLES DRAWN AEROBIC AND ANAEROBIC Blood Culture results may not be optimal due to an inadequate volume of blood received in culture bottles   Culture   Final    NO GROWTH 5 DAYS Performed at [facility], [place] [address], [place], [place] [address]    Report Status 03/20/2021 FINAL  Final  Culture, blood (routine x 2)     Status: None   Collection Time: 03/15/21  3:47 PM   Specimen: BLOOD RIGHT HAND  Result Value Ref Range Status   Specimen Description BLOOD RIGHT HAND  Final   Special Requests   Final    BOTTLES DRAWN AEROBIC AND ANAEROBIC Blood Culture adequate volume   Culture   Final    NO GROWTH 5 DAYS Performed at [facility], [place] [address], [place],  [address]    Report Status 03/20/2021 FINAL  Final  Aerobic/Anaerobic Culture w Gram Stain (surgical/deep wound)     Status: None (Preliminary result)   Collection Time: 03/19/21  5:09  PM   Specimen: Abscess  Result Value Ref Range Status   Specimen Description ABSCESS  Final   Special Requests LEFT PSOAS  Final   Gram Stain   Final    ABUNDANT WBC PRESENT,BOTH PMN AND MONONUCLEAR FEW GRAM POSITIVE COCCI Performed at [facility], [place] [address] [place], [place] [address]    Culture   Final    FEW STAPHYLOCOCCUS AUREUS NO ANAEROBES ISOLATED; CULTURE IN PROGRESS FOR 5 DAYS    Report Status PENDING  Incomplete   Organism ID, Bacteria STAPHYLOCOCCUS AUREUS  Final      Susceptibility   Staphylococcus aureus - MIC*    CIPROFLOXACIN <=0.5 SENSITIVE Sensitive     ERYTHROMYCIN <=0.25 SENSITIVE Sensitive     GENTAMICIN <=0.5 SENSITIVE  Sensitive     OXACILLIN 0.5 SENSITIVE Sensitive     TETRACYCLINE <=1 SENSITIVE Sensitive     VANCOMYCIN 1 SENSITIVE Sensitive     TRIMETH/SULFA <=10 SENSITIVE Sensitive     CLINDAMYCIN <=0.25 SENSITIVE Sensitive     RIFAMPIN <=0.5 SENSITIVE Sensitive     Inducible Clindamycin NEGATIVE Sensitive     * FEW STAPHYLOCOCCUS AUREUS     Pertinent Lab. CBC Latest Ref Rng & Units 03/21/2021 03/20/2021 03/19/2021  WBC 4.0 - 10.5 K/uL 14.2(H) 11.5(H) 12.0(H)  Hemoglobin 13.0 - 17.0 g/dL 10.9(L) 11.4(L) 10.5(L)  Hematocrit 39.0 - 52.0 % 34.4(L) 36.7(L) 35.0(L)  Platelets 150 - 400 K/uL 206 227 214   CMP Latest Ref Rng & Units 03/21/2021 03/20/2021 03/19/2021  Glucose 70 - 99 mg/dL 107(H) 107(H) 180(H)  BUN 8 - 23 mg/dL 40(H) 25(H) 60(H)  Creatinine 0.61 - 1.24 mg/dL 7.36(H) 5.28(H) 9.22(H)  Sodium 135 - 145 mmol/L 130(L) 133(L) 130(L)  Potassium 3.5 - 5.1 mmol/L 5.8(H) 4.2 4.7  Chloride 98 - 111 mmol/L 93(L) 94(L) 89(L)  CO2 22 - 32 mmol/L $RemoveB'22 26 26  'pdvTrFlo$ Calcium 8.9 - 10.3 mg/dL 8.5(L) 8.2(L) 8.5(L)  Total Protein 6.5 - 8.1 g/dL - - -  Total Bilirubin 0.3 - 1.2 mg/dL - - -  Alkaline Phos 38 - 126 U/L - - -  AST 15 - 41 U/L - - -  ALT 0 - 44 U/L - - -     Pertinent Imaging today Plain films and CT images have been personally visualized and interpreted; radiology reports have been reviewed. Decision making incorporated into the Impression / Recommendations.  I spent more than 35 minutes for this patient encounter including review of prior medical records, coordination of care  with greater than 50% of time being face to face/counseling and discussing diagnostics/treatment plan with the patient/family.  Electronically signed by:   Rosiland Oz, MD Infectious Disease Physician [facility] for Infectious Disease Pager: [phone]

## 2021-03-21 NOTE — Progress Notes (Signed)
Physical Therapy Treatment Patient Details Name: Ryan Wells MRN: [id number] DOB: [birth year] Today's Date: 03/21/2021    History of Present Illness Pt is 65 y.o. male presenting to ED on 8/3 with severe back/leg pain x2 weeks and diaphoresis.  Pt admitted with lumbar sacral osteomyelitis/discitis with septic arthritis L5-S1 and bil psoas abscess, R sided pleural effusion. . 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 At time of previous admission MRI (+) L5-S1 facet osteoarthritis, synovial cysts and periarticular soft tissue edema on the left. 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 Returned to ED 7/25 c/o radicular low back pain.  PMHx significant for ESRD on HD, HTN, DMII, chornic anemia and MSSA.    PT Comments    Pt continues to be limited by pain.  He has very poor tolerance for sitting with some improvements in standing but still limited.  Pt with significant improvement in bed mobility and good tolerance for rolling, core strengthening, and supine exercises.  Encouraged bed exercises and chair positioning in bed to improve tolerance.  Pt is making gradual improvements.    Follow Up Recommendations  CIR     Equipment Recommendations  Other (comment) (further assessment as pain improves/next venue)    Recommendations for Other Services       Precautions / Restrictions Precautions Precautions: Fall Precaution Comments: Back pain - back precautions for comfort    Mobility  Bed Mobility Overal bed mobility: Needs Assistance Bed Mobility: Rolling;Sidelying to Sit;Sit to Supine Rolling: Min guard (toward R) Sidelying to sit: Mod assist     Sit to sidelying: Min assist General bed mobility comments: Rolled to R as more pain in L buttock.  Pt performing roll slowly and with cues, able to get legs off bed but requiring mod A for trunk upright.  Cues for log roll back to bed with min A for legs.  Later  participated in multiple rolls for strengthening (5 each side)    Transfers Overall transfer level: Needs assistance Equipment used: Rolling walker (2 wheeled) Transfers: Sit to/from Stand Sit to Stand: Mod assist;+2 physical assistance         General transfer comment: Increased time to rise with mod A of 2; limited due to pain  Ambulation/Gait Ambulation/Gait assistance: Mod assist;+2 safety/equipment Gait Distance (Feet): 2 Feet Assistive device: Rolling walker (2 wheeled) Gait Pattern/deviations: Step-to pattern Gait velocity: decreased   General Gait Details: side steps toward [facility]; further ambulation limited due to pain   Stairs             Wheelchair Mobility    Modified Rankin (Stroke Patients Only)       Balance Overall balance assessment: Needs assistance Sitting-balance support: Bilateral upper extremity supported;Feet supported Sitting balance-Leahy Scale: Poor Sitting balance - Comments: leans toward R to offload L hip; UE support for pain control   Standing balance support: Bilateral upper extremity supported Standing balance-Leahy Scale: Poor Standing balance comment: Requiring RW and assist of 2                            Cognition Arousal/Alertness: Awake/alert Behavior During Therapy: Anxious Overall Cognitive Status: No family/caregiver present to determine baseline cognitive functioning                                 General Comments: increased time for processing; most likely  affected by pain      Exercises General Exercises - Lower Extremity Ankle Circles/Pumps: AROM;Both;10 reps;Supine Heel Slides: AROM;Both;10 reps;Supine Other Exercises Other Exercises: bridging x 10 Other Exercises: hip/add/abduction and trunk rotational movement x 10 - tolerated well Other Exercises: core strengthening of obliques with reaching adn crossing midline in supine x 10    General Comments General comments (skin  integrity, edema, etc.): Provided education on importance of starting to increase mobility even if painful.  Encouraged pt to perform AROM exercises in bed and try chair position in bed as able.      Pertinent Vitals/Pain Pain Assessment: Faces Faces Pain Scale: Hurts whole lot (with movement) Pain Location: back and L buttocks Pain Descriptors / Indicators: Discomfort;Grimacing;Guarding;Moaning Pain Intervention(s): Limited activity within patient's tolerance;Monitored during session;Repositioned;Premedicated before session    Home Living                      Prior Function            PT Goals (current goals can now be found in the care plan section) Acute Rehab PT Goals Patient Stated Goal: to be able to do for myself Progress towards PT goals: Progressing toward goals    Frequency    Min 4X/week      PT Plan Current plan remains appropriate    Co-evaluation PT/OT/SLP Co-Evaluation/Treatment: Yes Reason for Co-Treatment: Complexity of the patient's impairments (multi-system involvement);Other (comment) (pain control) PT goals addressed during session: Mobility/safety with mobility;Balance OT goals addressed during session: ADL's and self-care;Strengthening/ROM      AM-PAC PT "6 Clicks" Mobility   Outcome Measure  Help needed turning from your back to your side while in a flat bed without using bedrails?: A Little Help needed moving from lying on your back to sitting on the side of a flat bed without using bedrails?: A Lot Help needed moving to and from a bed to a chair (including a wheelchair)?: Total Help needed standing up from a chair using your arms (e.g., wheelchair or bedside chair)?: Total Help needed to walk in hospital room?: Total Help needed climbing 3-5 steps with a railing? : Total 6 Click Score: 9    End of Session Equipment Utilized During Treatment: Gait belt Activity Tolerance: Patient limited by pain Patient left: in bed;with call  bell/phone within reach;with bed alarm set (chair position) Nurse Communication: Mobility status PT Visit Diagnosis: Difficulty in walking, not elsewhere classified (R26.2);Pain     Time: [id number] PT Time Calculation (min) (ACUTE ONLY): 32 min  Charges:  $Therapeutic Activity: 8-22 mins                     Abran Richard, PT Acute Rehab Services Pager [phone] [facility] [place] 03/21/2021, 2:25 PM

## 2021-03-21 NOTE — Consult Note (Signed)
   [facility] Inpatient Consult   03/21/2021  KEDDRICK SINNER 04/11/1956 [id number]  [employer] [ACO] Patient: [facility] Medicare   Patient screened for hospitalization with noted extreme high risk score for unplanned readmission risk with a past history [place] Management service in the distant past. Review of patient's medical record reveals patient is for an inpatient rehabilitation verse a skilled nursing facility for transition of care/disposition.   Plan:  Continue to follow progress and disposition to assess for post hospital care management needs.    For questions contact:   Natividad Brood, RN BSN [facility] Liaison  [phone] business mobile phone Toll free office [phone]  Fax number: [phone] [email or web address]

## 2021-03-21 NOTE — Progress Notes (Signed)
Occupational Therapy Treatment Patient Details Name: JONITHAN MEDEARIS MRN: [id number] DOB: Jun 22, 1956 Today's Date: 03/21/2021    History of present illness Pt is 65 y.o. male presenting to ED on 8/3 with severe back/leg pain x2 weeks and diaphoresis.  Pt admitted with lumbar sacral osteomyelitis/discitis with septic arthritis L5-S1 and bil psoas abscess, R sided pleural effusion. . 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 At time of previous admission MRI (+) L5-S1 facet osteoarthritis, synovial cysts and periarticular soft tissue edema on the left. 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 Returned to ED 7/25 c/o radicular low back pain.  PMHx significant for ESRD on HD, HTN, DMII, chornic anemia and MSSA.   OT comments  [employer] interpreter Joelene Millin [phone] used throughout session. Pt demonstrating improvement in bed mobility with rolling toward R side. Requires Mod A to transition upright from sidelying and continues to have significant pain (pt moaning and yelling out) in the seated position. Stood with mod A +2 @ RW level however unable to achieve full upright position. Took 5 side steps. May attempt use of Ethelene Hal to encourage thoracic extension. Pt with increased tolerance for trunk rotation and participating in LB dressing at bed level.  Encourage nsg to use Chair position in bed to increase sitting tolerance. If appropriate, pt may benefit from use of anxiety meds to help progress mobility.  Pt very appreciative. Continue to recommend rehab at [facility].    Follow Up Recommendations  Supervision/Assistance - 24 hour;CIR;Other (comment)    Equipment Recommendations  3 in 1 bedside commode    Recommendations for Other Services Rehab consult    Precautions / Restrictions Precautions Precautions: Fall Precaution Comments: Back pain - back precautions for comfort       Mobility Bed Mobility Overal bed mobility:  Needs Assistance   Rolling: Min guard (toward R) Sidelying to sit: Mod assist     Sit to sidelying: Min assist General bed mobility comments: difficulty with shifting weight onto L hip/buttock due to pain    Transfers Overall transfer level: Needs assistance   Transfers: Sit to/from Stand Sit to Stand: Mod assist;+2 physical assistance         General transfer comment: unable to achieve full upright posture    Balance     Sitting balance-Leahy Scale: Poor Sitting balance - Comments: leans toward R to offload L hip                                   ADL either performed or assessed with clinical judgement   ADL   Eating/Feeding: Modified independent   Grooming: Set up;Bed level   Upper Body Bathing: Set up;Bed level   Lower Body Bathing: Moderate assistance;Bed level       Lower Body Dressing: Moderate assistance;Bed level Lower Body Dressing Details (indicate cue type and reason): bed level; bringing feet like in figure four position to help donn/doff socks             Functional mobility during ADLs: Moderate assistance;+2 for physical assistance;Rolling walker       Vision       Perception     Praxis      Cognition Arousal/Alertness: Awake/alert Behavior During Therapy: Anxious Overall Cognitive Status: No family/caregiver present to determine baseline cognitive functioning  General Comments: increased time for processing; most likely affected by pain        Exercises Other Exercises Other Exercises: bridging x 10 Other Exercises: hip/add/abduction and trunk rotational movement x 10 - tolerated well Other Exercises: core strengthening of obliques with reaching adn crossing midline in supine x 10   Shoulder Instructions       General Comments 5 side steps toward R toward [facility]    Pertinent Vitals/ Pain       Pain Assessment: Faces Faces Pain Scale: Hurts whole lot Pain  Location: back (L buttocks) Pain Descriptors / Indicators: Discomfort;Grimacing;Guarding;Moaning Pain Intervention(s): Limited activity within patient's tolerance;Premedicated before session;Repositioned  Home Living                                          Prior Functioning/Environment              Frequency  Min 2X/week        Progress Toward Goals  OT Goals(current goals can now be found in the care plan section)  Progress towards OT goals: Progressing toward goals  Acute Rehab OT Goals Patient Stated Goal: to be able to do for myself OT Goal Formulation: With patient Time For Goal Achievement: 03/31/21 Potential to Achieve Goals: Good ADL Goals Pt Will Perform Upper Body Dressing: with supervision;sitting Pt Will Perform Lower Body Dressing: with supervision;sit to/from stand;with adaptive equipment Pt Will Transfer to Toilet: with supervision;bedside commode Pt Will Perform Toileting - Clothing Manipulation and hygiene: with supervision;sit to/from stand  Plan Discharge plan remains appropriate    Co-evaluation    PT/OT/SLP Co-Evaluation/Treatment: Yes Reason for Co-Treatment: To address functional/ADL transfers;For patient/therapist safety   OT goals addressed during session: ADL's and self-care;Strengthening/ROM      AM-PAC OT "6 Clicks" Daily Activity     Outcome Measure   Help from another person eating meals?: None Help from another person taking care of personal grooming?: A Little Help from another person toileting, which includes using toliet, bedpan, or urinal?: A Lot Help from another person bathing (including washing, rinsing, drying)?: A Lot Help from another person to put on and taking off regular upper body clothing?: A Little Help from another person to put on and taking off regular lower body clothing?: A Lot 6 Click Score: 16    End of Session Equipment Utilized During Treatment: Gait belt;Rolling walker  OT Visit  Diagnosis: Unsteadiness on feet (R26.81);Repeated falls (R29.6);Muscle weakness (generalized) (M62.81);Pain Pain - Right/Left: Left Pain - part of body: Hip (buttock/back)   Activity Tolerance Patient limited by pain   Patient Left in bed;with call bell/phone within reach;with bed alarm set;Other (comment) (modified chair position - reverse trendelenberg used to increase comfort of back while allowing for "chair position")   Nurse Communication Mobility status;Patient requests pain meds        Time: [id number] OT Time Calculation (min): 33 min  Charges: OT General Charges $OT Visit: 1 Visit OT Treatments $Self Care/Home Management : 8-22 mins  Maurie Boettcher, OT/L   Acute OT Clinical Specialist [place] Pager [phone] Office [phone]    [facility] 03/21/2021, 1:52 PM

## 2021-03-21 NOTE — Progress Notes (Addendum)
PROGRESS NOTE    Ryan Wells  Ryan Wells DOB: 14-Aug-1955 Ryan Wells: 03/14/2021 PCP: Pcp, No    Brief Narrative:   Ryan Wells is a 65 y.o. male with medical history of end-stage renal disease on hemodialysis, essential hypertension, diabetes mellitus type 2, anemia of chronic kidney disease,  presented to the hospital with back and leg pain worsening for 2 weeks.  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  It appears during this hospitalization he had MRI imaging which noted bilateral L5-S1 facet osteoarthritis left greater than right with synovial cysts and periarticular soft tissue edema on the left which superimposed septic arthritis could not be excluded and anterior displacement of the cord with distortion at T6-T7.  He had completed 6 weeks of IV antibiotics with dialysis per ID notes from 8/2.  Patient was again seen in the emergency department on 7/25 for radicular pain and neurosurgery had recommended short course of steroids.  On this admission, in the emergency department patient was seen to be afebrile with stable vital signs.  CT imaging of the head and orbits negative for any acute intracranial pathology, but noted left facial and periorbital soft tissue contusion.  Labs from 8/3 significant for WBC 15.1, hemoglobin 10, potassium 4.4, BUN 63, creatinine 8.5, anion gap 20, and high-sensitivity troponin 84->101.  Chest x-ray significant for cardiomegaly with vascular congestion, focal area with pulmonary nodularity of the right mid lung correlates with cavitating lesion and a small right-sided pleural effusion.  Patient admitted hospital for further evaluation and treatment.   Principal Problem:   Lumbar back pain Active Problems:   Subclinical hypothyroidism   Anemia due to chronic kidney disease   Diabetes mellitus type II, non insulin dependent (HCC)    ESRD on hemodialysis (HCC)   Pleural effusion   Elevated troponin   Leg pain   Leukocytosis   History of bacteremia  Lumbar/sacral osteomyelitis/discitis, L5-S1, Septic arthritis L5-S1, POA Bilateral psoas abscess s/p IR aspiration, POA Patient presented with worsening lumbar back pain with radiation.  Had recently completed 6 weeks course of antibiotic as per infectious disease.  MSSA bacteremia on 03/13/2021.  WBC was elevated at 15.1 on presentation.   MRI of the lumbosacral spine on 8/ 5 notable for L5-S1 discitis with osteomyelitis with left psoas abscess.  IR drained 60 mL of IV fluid from the left source region.  Neurosurgery and infectious disease following.  WBC elevated.  Blood cultures from 03/15/2021 with no growth so far.  Wound culture from left foot ulcer abscess with few gram-positive cocci.  Continue pain management and cefazolin.  Monitor CBC daily.  Left face contusion:  On CT scan.  No erythema.  Continue supportive care    Elevated troponin:  Mildly elevated.  EKG unremarkable.  Likely secondary to demand ischemia.  Right-sided pleural effusion:  Continue hemodialysis for fluid management.  No obvious distress.   ESRD on HD with hyperkalemia.:  Resume hemodialysis as per schedule.  Lokelma today as per nephrology.  Diabetes mellitus type 2:  Latest hemoglobin A1c of 5.1 on 01/16/2021.  Continue sliding scale insulin, Accu-Cheks, diabetic diet.   History of MSSA bacteremia:  Has previously completed 6 weeks of antibiotic.  Had TEE with no evidence of endocarditis in the past.  On cefazolin at this time    Anemia chronic kidney disease:  Baseline hemoglobin of around 8.0.  Latest hemoglobin of 10.9.  We  will continue to monitor as outpatient   Subclinical hypothyroidism:  T4 normal.  TSH elevated.  Will need outpatient follow-up and 4 to 6 weeks.  Weakness/deconditioning/gait disturbance: Physical therapy on board and recommending CIR on discharge.   DVT  prophylaxis: heparin injection 5,000 Units Start: 03/15/21 1400   CODE STATUS-full code  Family Communication: ??No family present at bedside this morning.  Disposition Plan:  Level of care: Telemetry Medical Status is: Inpatient  Remains inpatient appropriate because:Ongoing diagnostic testing needed not appropriate for outpatient work up, Unsafe d/c plan, IV treatments appropriate due to intensity of illness or inability to take PO, and Inpatient level of care appropriate due to severity of illness  Dispo: The patient is from: Home              Anticipated d/c is to: Home              Patient currently is not medically stable to d/c.   Difficult to place patient No   Consultants:  Neurosurgery Nephrology Infectious disease Interventional radiology  Procedures:  IR guided aspiration of the left psoas abscess on 03/19/2021 removal of 60 mL purulent fluid.Marland Kitchen  Antimicrobials:  Ceftriaxone 8/4 - 8/6 Cefazolin 8/6>>   Subjective: Video interpreter at bedside. Today, patient was seen and examined at bedside.  Patient does complain of mild pain but not too bad.  Has had bowel movements yesterday.  Objective: Vitals:   03/20/21 0811 03/20/21 2125 03/21/21 0552 03/21/21 0749  BP: (!) 164/73 (!) 152/64 127/61 133/65  Pulse: 68 75 68 66  Resp:  '18 17 18  '$ Temp: 97.8 F (36.6 C) 99.1 F (37.3 C)    TempSrc: Oral Oral    SpO2:    100%  Weight:      Height:       No intake or output data in the 24 hours ending 03/21/21 0845  Filed Weights   03/14/21 2254 03/16/21 0815 03/19/21 0818  Weight: 69 kg 63.2 kg 60.1 kg    Physical examination:  General:  Average built, not in obvious distress, poor dentition, alert awake and communicative. HENT:   No scleral pallor or icterus noted. Oral mucosa is moist.  Chest: Diminished breath sounds bilaterally CVS: S1 &S2 heard. No murmur.  Regular rate and rhythm.  Nonspecific tenderness over the back. Abdomen: Soft, nontender,  nondistended.  Bowel sounds are heard.   Extremities: No cyanosis, clubbing or edema.  Peripheral pulses are palpable. Psych: Alert, awake and oriented, normal mood CNS: Moving extremities. Skin: Warm and dry.  No rashes noted.   Data Reviewed: I have personally reviewed the following labs and imaging studies   CBC: Recent Labs  Lab 03/14/21 2259 03/16/21 0049 03/17/21 0219 03/18/21 0349 03/19/21 0415 03/20/21 0324 03/21/21 0450  WBC 15.1*   < > 12.3* 12.9* 12.0* 11.5* 14.2*  NEUTROABS 12.8*  --   --   --   --   --   --   HGB 10.0*   < > 10.3* 10.6* 10.5* 11.4* 10.9*  HCT 32.0*   < > 34.0* 35.5* 35.0* 36.7* 34.4*  MCV 95.8   < > 98.0 99.2 99.4 98.9 94.0  PLT 212   < > 193 209 214 227 206   < > = values in this interval not displayed.    Basic Metabolic Panel: Recent Labs  Lab 03/17/21 0219 03/18/21 0349 03/19/21 0415 03/20/21 0324 03/21/21 0450  NA 134* 133* 130* 133* 130*  K 3.7 4.5 4.7 4.2  5.8*  CL 94* 92* 89* 94* 93*  CO2 '27 27 26 26 22  '$ GLUCOSE 178* 157* 180* 107* 107*  BUN 34* 50* 60* 25* 40*  CREATININE 5.94* 7.79* 9.22* 5.28* 7.36*  CALCIUM 7.7* 8.2* 8.5* 8.2* 8.5*  PHOS 4.0 4.2 5.0* 4.6 5.0*    GFR: Estimated Creatinine Clearance: 7.6 mL/min (A) (by C-G formula based on SCr of 7.36 mg/dL (H)). Liver Function Tests: Recent Labs  Lab 03/14/21 2259 03/15/21 1150 03/17/21 0219 03/18/21 0349 03/19/21 0415 03/20/21 0324 03/21/21 0450  AST 21  --   --   --   --   --   --   ALT 9  --   --   --   --   --   --   ALKPHOS 107  --   --   --   --   --   --   BILITOT 1.2  --   --   --   --   --   --   PROT 7.1  --   --   --   --   --   --   ALBUMIN 3.4*   < > 2.5* 2.4* 2.4* 2.5* 2.3*   < > = values in this interval not displayed.    No results for input(s): LIPASE, AMYLASE in the last 168 hours. No results for input(s): AMMONIA in the last 168 hours. Coagulation Profile: Recent Labs  Lab 03/19/21 0415  INR 1.1    Cardiac Enzymes: No results for  input(s): CKTOTAL, CKMB, CKMBINDEX, TROPONINI in the last 168 hours. BNP (last 3 results) No results for input(s): PROBNP in the last 8760 hours. HbA1C: No results for input(s): HGBA1C in the last 72 hours. CBG: No results for input(s): GLUCAP in the last 168 hours. Lipid Profile: No results for input(s): CHOL, HDL, LDLCALC, TRIG, CHOLHDL, LDLDIRECT in the last 72 hours. Thyroid Function Tests: No results for input(s): TSH, T4TOTAL, FREET4, T3FREE, THYROIDAB in the last 72 hours.  Anemia Panel: No results for input(s): VITAMINB12, FOLATE, FERRITIN, TIBC, IRON, RETICCTPCT in the last 72 hours. Sepsis Labs: No results for input(s): PROCALCITON, LATICACIDVEN in the last 168 hours.  Recent Results (from the past 240 hour(s))  Resp Panel by RT-PCR (Flu A&B, Covid) Nasopharyngeal Swab     Status: None   Collection Time: 03/15/21  2:08 AM   Specimen: Nasopharyngeal Swab; Nasopharyngeal(NP) swabs in vial transport medium  Result Value Ref Range Status   SARS Coronavirus 2 by RT PCR NEGATIVE NEGATIVE Final    Comment: (NOTE) SARS-CoV-2 target nucleic acids are NOT DETECTED.  The SARS-CoV-2 RNA is generally detectable in upper respiratory specimens during the acute phase of infection. The lowest concentration of SARS-CoV-2 viral copies this assay can detect is 138 copies/mL. A negative result does not preclude SARS-Cov-2 infection and should not be used as the sole basis for treatment or other patient management decisions. A negative result may occur with  improper specimen collection/handling, submission of specimen other than nasopharyngeal swab, presence of viral mutation(s) within the areas targeted by this assay, and inadequate number of viral copies(<138 copies/mL). A negative result must be combined with clinical observations, patient history, and epidemiological information. The expected result is Negative.  Fact Sheet for Patients:   EntrepreneurPulse.com.au  Fact Sheet for Healthcare Providers:  IncredibleEmployment.be  This test is no t yet approved or cleared by the [place] FDA and  has been authorized for detection and/or diagnosis of SARS-CoV-2 by FDA under  an Emergency Use Authorization (EUA). This EUA will remain  in effect (meaning this test can be used) for the duration of the COVID-19 declaration under Section 564(b)(1) of the Act, 21 U.S.C.section 360bbb-3(b)(1), unless the authorization is terminated  or revoked sooner.       Influenza A by PCR NEGATIVE NEGATIVE Final   Influenza B by PCR NEGATIVE NEGATIVE Final    Comment: (NOTE) The Xpert Xpress SARS-CoV-2/FLU/RSV plus assay is intended as an aid in the diagnosis of influenza from Nasopharyngeal swab specimens and should not be used as a sole basis for treatment. Nasal washings and aspirates are unacceptable for Xpert Xpress SARS-CoV-2/FLU/RSV testing.  Fact Sheet for Patients: EntrepreneurPulse.com.au  Fact Sheet for Healthcare Providers: IncredibleEmployment.be  This test is not yet approved or cleared by the [place] FDA and has been authorized for detection and/or diagnosis of SARS-CoV-2 by FDA under an Emergency Use Authorization (EUA). This EUA will remain in effect (meaning this test can be used) for the duration of the COVID-19 declaration under Section 564(b)(1) of the Act, 21 U.S.C. section 360bbb-3(b)(1), unless the authorization is terminated or revoked.  Performed at [facility], [address], [place], [place] [address]   Culture, blood (routine x 2)     Status: None   Collection Time: 03/15/21  3:39 PM   Specimen: BLOOD  Result Value Ref Range Status   Specimen Description BLOOD RIGHT ANTECUBITAL  Final   Special Requests   Final    BOTTLES DRAWN AEROBIC AND ANAEROBIC Blood Culture results may not be optimal due to an  inadequate volume of blood received in culture bottles   Culture   Final    NO GROWTH 5 DAYS Performed at [facility], [place] [address] [place], [place] [address]    Report Status 03/20/2021 FINAL  Final  Culture, blood (routine x 2)     Status: None   Collection Time: 03/15/21  3:47 PM   Specimen: BLOOD RIGHT HAND  Result Value Ref Range Status   Specimen Description BLOOD RIGHT HAND  Final   Special Requests   Final    BOTTLES DRAWN AEROBIC AND ANAEROBIC Blood Culture adequate volume   Culture   Final    NO GROWTH 5 DAYS Performed at [facility], [place] [address] [place], [place] [address]    Report Status 03/20/2021 FINAL  Final  Aerobic/Anaerobic Culture w Gram Stain (surgical/deep wound)     Status: None (Preliminary result)   Collection Time: 03/19/21  5:09 PM   Specimen: Abscess  Result Value Ref Range Status   Specimen Description ABSCESS  Final   Special Requests LEFT PSOAS  Final   Gram Stain   Final    ABUNDANT WBC PRESENT,BOTH PMN AND MONONUCLEAR FEW GRAM POSITIVE COCCI    Culture   Final    FEW STAPHYLOCOCCUS AUREUS SUSCEPTIBILITIES TO FOLLOW Performed at [facility], [place] [address], [place], [place] [address]    Report Status PENDING  Incomplete      Radiology Studies: CT ASPIRATION  Result Date: 03/19/2021 INDICATION: 65 year old male with osteomyelitis discitis of L5-S1, and small abscess on prior imaging referred for aspiration/possible drainage. EXAM: CT-GUIDED ASPIRATION OF LEFT PSOAS ABSCESS MEDICATIONS: NONE ANESTHESIA/SEDATION: Fentanyl 50 mcg IV; Versed 1.0 mg IV Moderate Sedation Time:  11 minutes The patient was continuously monitored during the procedure by the interventional radiology nurse under my direct supervision. COMPLICATIONS: None PROCEDURE: Informed written consent was obtained from the patient after  a thorough discussion of the procedural risks, benefits and alternatives. All questions were addressed. Maximal Sterile  Barrier Technique was utilized including caps, mask, sterile gowns, sterile gloves, sterile drape, hand hygiene and skin antiseptic. A timeout was performed prior to the initiation of the procedure. Patient positioned supine position on CT gantry table and scout CT was acquired for planning purposes. The patient was then prepped and draped in the usual sterile fashion. 1% lidocaine was used for local anesthesia. Using CT guidance, a Yueh needle was advanced into the site of the psoas abscess of the left psoas muscle, via a trans mesenteric approach, anteriorly. With once we confirmed needle tip position, we aspirated approximately 6 cc of purulent material. Culture was sent. Needle was removed and a final CT was acquired. Patient tolerated the procedure well and remained hemodynamically stable throughout. No complications were encountered and no significant blood loss. IMPRESSION: Status post CT-guided aspirate of small left psoas abscess for a culture. Signed, Dulcy Fanny. Dellia Nims, RPVI [facility] Electronically Signed   By: Corrie Mckusick D.O.   On: 03/19/2021 17:32      Scheduled Meds:  Chlorhexidine Gluconate Cloth  6 each Topical Q0600   darbepoetin (ARANESP) injection - DIALYSIS  100 mcg Intravenous Q Fri-HD   doxercalciferol  3 mcg Intravenous Q M,W,F-HD   feeding supplement (NEPRO CARB STEADY)  237 mL Oral BID BM   gabapentin  100 mg Oral QHS   heparin  5,000 Units Subcutaneous Q8H   midodrine  10 mg Oral TID WC   multivitamin  1 tablet Oral QHS   sodium chloride flush  3 mL Intravenous Q12H   sucroferric oxyhydroxide  1,000 mg Oral TID WC   Continuous Infusions:   ceFAZolin (ANCEF) IV 1 g (03/20/21 2113)     LOS: 6 days    Flora Lipps, MD [facility] 03/21/2021, 8:45 AM

## 2021-03-21 NOTE — Progress Notes (Addendum)
[facility] Progress Note   Subjective: Seen in room. Resting quietly in NAD. HD postponed until 03/22/2021 D/T staffing issues. Mild hyperkalemia noted. WBC up to 14.2. Low grade temp overnight  Objective Vitals:   03/20/21 0811 03/20/21 2125 03/21/21 0552 03/21/21 0749  BP: (!) 164/73 (!) 152/64 127/61 133/65  Pulse: 68 75 68 66  Resp:  '18 17 18  '$ Temp: 97.8 F (36.6 C) 99.1 F (37.3 C)    TempSrc: Oral Oral    SpO2:    100%  Weight:      Height:       Physical Exam General: Pleasant older male in NAD Heart: S1,S2 RRR Lungs: CTAB Abdomen:NABS Extremities: No LE edema Dialysis Access: L AVF +T/B    Dialysis Orders:  Additional Objective Labs: Basic Metabolic Panel: Recent Labs  Lab 03/19/21 0415 03/20/21 0324 03/21/21 0450  NA 130* 133* 130*  K 4.7 4.2 5.8*  CL 89* 94* 93*  CO2 '26 26 22  '$ GLUCOSE 180* 107* 107*  BUN 60* 25* 40*  CREATININE 9.22* 5.28* 7.36*  CALCIUM 8.5* 8.2* 8.5*  PHOS 5.0* 4.6 5.0*   Liver Function Tests: Recent Labs  Lab 03/14/21 2259 03/15/21 1150 03/19/21 0415 03/20/21 0324 03/21/21 0450  AST 21  --   --   --   --   ALT 9  --   --   --   --   ALKPHOS 107  --   --   --   --   BILITOT 1.2  --   --   --   --   PROT 7.1  --   --   --   --   ALBUMIN 3.4*   < > 2.4* 2.5* 2.3*   < > = values in this interval not displayed.   No results for input(s): LIPASE, AMYLASE in the last 168 hours. CBC: Recent Labs  Lab 03/14/21 2259 03/16/21 0049 03/17/21 0219 03/18/21 0349 03/19/21 0415 03/20/21 0324 03/21/21 0450  WBC 15.1*   < > 12.3* 12.9* 12.0* 11.5* 14.2*  NEUTROABS 12.8*  --   --   --   --   --   --   HGB 10.0*   < > 10.3* 10.6* 10.5* 11.4* 10.9*  HCT 32.0*   < > 34.0* 35.5* 35.0* 36.7* 34.4*  MCV 95.8   < > 98.0 99.2 99.4 98.9 94.0  PLT 212   < > 193 209 214 227 206   < > = values in this interval not displayed.   Blood Culture    Component Value Date/Time   SDES ABSCESS 03/19/2021 1709   SPECREQUEST  LEFT PSOAS 03/19/2021 1709   CULT  03/19/2021 1709    FEW STAPHYLOCOCCUS AUREUS SUSCEPTIBILITIES TO FOLLOW Performed at [facility], [place] [address], [place], [place] [address]    REPTSTATUS PENDING 03/19/2021 1709    Cardiac Enzymes: No results for input(s): CKTOTAL, CKMB, CKMBINDEX, TROPONINI in the last 168 hours. CBG: No results for input(s): GLUCAP in the last 168 hours. Iron Studies: No results for input(s): IRON, TIBC, TRANSFERRIN, FERRITIN in the last 72 hours. '@[email or web address]'$ @ Studies/Results: CT ASPIRATION  Result Date: 03/19/2021 INDICATION: 65 year old male with osteomyelitis discitis of L5-S1, and small abscess on prior imaging referred for aspiration/possible drainage. EXAM: CT-GUIDED ASPIRATION OF LEFT PSOAS ABSCESS MEDICATIONS: NONE ANESTHESIA/SEDATION: Fentanyl 50 mcg IV; Versed 1.0 mg IV Moderate Sedation Time:  11 minutes The patient was continuously monitored during the procedure by the interventional radiology nurse under  my direct supervision. COMPLICATIONS: None PROCEDURE: Informed written consent was obtained from the patient after a thorough discussion of the procedural risks, benefits and alternatives. All questions were addressed. Maximal Sterile Barrier Technique was utilized including caps, mask, sterile gowns, sterile gloves, sterile drape, hand hygiene and skin antiseptic. A timeout was performed prior to the initiation of the procedure. Patient positioned supine position on CT gantry table and scout CT was acquired for planning purposes. The patient was then prepped and draped in the usual sterile fashion. 1% lidocaine was used for local anesthesia. Using CT guidance, a Yueh needle was advanced into the site of the psoas abscess of the left psoas muscle, via a trans mesenteric approach, anteriorly. With once we confirmed needle tip position, we aspirated approximately 6 cc of purulent material. Culture was sent. Needle was removed and a final CT was acquired.  Patient tolerated the procedure well and remained hemodynamically stable throughout. No complications were encountered and no significant blood loss. IMPRESSION: Status post CT-guided aspirate of small left psoas abscess for a culture. Signed, Dulcy Fanny. Dellia Nims, RPVI [facility] Electronically Signed   By: Corrie Mckusick D.O.   On: 03/19/2021 17:32   Medications:   ceFAZolin (ANCEF) IV 1 g (03/20/21 2113)    Chlorhexidine Gluconate Cloth  6 each Topical Q0600   darbepoetin (ARANESP) injection - DIALYSIS  100 mcg Intravenous Q Fri-HD   doxercalciferol  3 mcg Intravenous Q M,W,F-HD   feeding supplement (NEPRO CARB STEADY)  237 mL Oral BID BM   gabapentin  100 mg Oral QHS   heparin  5,000 Units Subcutaneous Q8H   midodrine  10 mg Oral TID WC   multivitamin  1 tablet Oral QHS   sodium chloride flush  3 mL Intravenous Q12H   sucroferric oxyhydroxide  1,000 mg Oral TID WC     Dialysis Orders:[facility] M[facility]  3h 78mn  180NRe 400/600  64kg   2/2 bath  P4  AVF   -Heparin  2200 units IV TIW -Mircera 150 mcg IV q 2 week (last dose 150 mcg IV 02/28/2021) -Venofer 100 mg IV X 10 doses (4/6 doses given.  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  Neurosurgery evaluated and no intervention deemed necessary.  Per ID input, will change to cefazolin (as likely MSSA given h/o MSSA bacteremia recently).  IR consulted for drainage of psoas abscess for GS, cx, and sensitivity.   Pulmonary edema: Vascular congestion, small R pleural effusion on CXR but appears mild in comparison to CXR 01/2021. He has not been eating well, has most likely lost body wt. No acute dialysis needs today. No evidence of overt volume overload.   Elevated troponin: In setting of vascular congestion and ESRD. Troponin 84, 101 so far. Denies chest pain. Per primary L face  contusion-supportive care per primary Mild hyperkalemia-K+5.8 today. No hemolysis. Lokelma 10 gram PO now and repeat in 8 hours. RFP in AM.   ESRD - Next HD 03/22/2021 due to staffing issues in HD unit. At present time he is not able to sit up in recliner without significant discomfort.  He may need rehab before he can be discharged to home for outpatient dialysis.  Hypertension/volume  - As noted above. BP well controlled. On midodrine 10 mg PO TID however notified by pharmacy that many doses have been held D/T high BP. Change to midodrine  10 mg PO TIW 30 prior to HD. May repeat mid HD if necessary. No evidence of overt volume overload by exam on admit. Now very much under EDW. Will need EDW reset on discharge. UF as tolerated.   Anemia  - HGB 10.5.  Missed OP ESA 08/03. Give today with HD. Stop Fe load in setting of infection.   Metabolic bone disease - Continue velphoro 500 mg 2 tabs PO TID AC. Continue VDRA. Labs at goal.   Nutrition - Renal carb mod diet, nepro, renal vits.  DM-No meds on OP list. Last HA1c 6.9. per primary Disposition - will likely need SNF given severe debilitation from back pain and inability to sit up/ambulate. Does he have resources for this care? Consult MSW.  Srinidhi Landers H. Veena Sturgess NP-C 03/21/2021, 9:14 AM  [employer] [phone]

## 2021-03-22 DIAGNOSIS — M545 Low back pain, unspecified: Secondary | ICD-10-CM | POA: Diagnosis not present

## 2021-03-22 DIAGNOSIS — E119 Type 2 diabetes mellitus without complications: Secondary | ICD-10-CM | POA: Diagnosis not present

## 2021-03-22 DIAGNOSIS — R778 Other specified abnormalities of plasma proteins: Secondary | ICD-10-CM | POA: Diagnosis not present

## 2021-03-22 DIAGNOSIS — N186 End stage renal disease: Secondary | ICD-10-CM | POA: Diagnosis not present

## 2021-03-22 LAB — RENAL FUNCTION PANEL
Albumin: 2.3 g/dL — ABNORMAL LOW (ref 3.5–5.0)
Albumin: 2.4 g/dL — ABNORMAL LOW (ref 3.5–5.0)
Anion gap: 13 (ref 5–15)
Anion gap: 13 (ref 5–15)
BUN: 53 mg/dL — ABNORMAL HIGH (ref 8–23)
BUN: 53 mg/dL — ABNORMAL HIGH (ref 8–23)
CO2: 28 mmol/L (ref 22–32)
CO2: 28 mmol/L (ref 22–32)
Calcium: 8.7 mg/dL — ABNORMAL LOW (ref 8.9–10.3)
Calcium: 8.8 mg/dL — ABNORMAL LOW (ref 8.9–10.3)
Chloride: 91 mmol/L — ABNORMAL LOW (ref 98–111)
Chloride: 90 mmol/L — ABNORMAL LOW (ref 98–111)
Creatinine, Ser: 9.22 mg/dL — ABNORMAL HIGH (ref 0.61–1.24)
Creatinine, Ser: 9.41 mg/dL — ABNORMAL HIGH (ref 0.61–1.24)
GFR, Estimated: 6 mL/min — ABNORMAL LOW (ref >60)
GFR, Estimated: 6 mL/min — ABNORMAL LOW (ref >60)
Glucose, Bld: 131 mg/dL — ABNORMAL HIGH (ref 70–99)
Glucose, Bld: 122 mg/dL — ABNORMAL HIGH (ref 70–99)
Phosphorus: 5.1 mg/dL — ABNORMAL HIGH (ref 2.5–4.6)
Phosphorus: 4.9 mg/dL — ABNORMAL HIGH (ref 2.5–4.6)
Potassium: 4.4 mmol/L (ref 3.5–5.1)
Potassium: 4.3 mmol/L (ref 3.5–5.1)
Sodium: 132 mmol/L — ABNORMAL LOW (ref 135–145)
Sodium: 131 mmol/L — ABNORMAL LOW (ref 135–145)

## 2021-03-22 LAB — CBC
HCT: 32.4 % — ABNORMAL LOW (ref 39.0–52.0)
HCT: 32.8 % — ABNORMAL LOW (ref 39.0–52.0)
Hemoglobin: 10 g/dL — ABNORMAL LOW (ref 13.0–17.0)
Hemoglobin: 9.9 g/dL — ABNORMAL LOW (ref 13.0–17.0)
MCH: 30.4 pg (ref 26.0–34.0)
MCH: 29.6 pg (ref 26.0–34.0)
MCHC: 30.9 g/dL (ref 30.0–36.0)
MCHC: 30.2 g/dL (ref 30.0–36.0)
MCV: 98.5 fL (ref 80.0–100.0)
MCV: 97.9 fL (ref 80.0–100.0)
Platelets: 207 10*3/uL (ref 150–400)
Platelets: 227 10*3/uL (ref 150–400)
RBC: 3.29 MIL/uL — ABNORMAL LOW (ref 4.22–5.81)
RBC: 3.35 MIL/uL — ABNORMAL LOW (ref 4.22–5.81)
RDW: 16.7 % — ABNORMAL HIGH (ref 11.5–15.5)
RDW: 16.9 % — ABNORMAL HIGH (ref 11.5–15.5)
WBC: 8 10*3/uL (ref 4.0–10.5)
WBC: 8.9 10*3/uL (ref 4.0–10.5)
nRBC: 0 % (ref 0.0–0.2)
nRBC: 0 % (ref 0.0–0.2)

## 2021-03-22 MED ORDER — DOXERCALCIFEROL 4 MCG/2ML IV SOLN
3.0000 ug | INTRAVENOUS | Status: AC
  Administered 2021-03-22: 3 ug via INTRAVENOUS

## 2021-03-22 MED ORDER — HEPARIN SODIUM (PORCINE) 1000 UNIT/ML IJ SOLN
INTRAMUSCULAR | Status: AC
  Filled 2021-03-22: qty 3

## 2021-03-22 MED ORDER — DOXERCALCIFEROL 4 MCG/2ML IV SOLN
3.0000 ug | INTRAVENOUS | Status: DC
  Administered 2021-03-26 – 2021-04-02 (×2): 3 ug via INTRAVENOUS
  Filled 2021-03-22 (×6): qty 2

## 2021-03-22 MED ORDER — DARBEPOETIN ALFA 100 MCG/0.5ML IJ SOSY
100.0000 ug | PREFILLED_SYRINGE | INTRAMUSCULAR | Status: DC
  Administered 2021-03-26 – 2021-04-02 (×2): 100 ug via INTRAVENOUS
  Filled 2021-03-22 (×2): qty 0.5

## 2021-03-22 MED ORDER — LIDOCAINE HCL (PF) 1 % IJ SOLN: 5.0000 mL | INTRAMUSCULAR | Status: DC | PRN

## 2021-03-22 MED ORDER — SENNOSIDES-DOCUSATE SODIUM 8.6-50 MG PO TABS
2.0000 | ORAL_TABLET | Freq: Every day | ORAL | Status: DC
  Administered 2021-03-22 – 2021-03-24 (×3): 2 via ORAL
  Filled 2021-03-22 (×4): qty 2

## 2021-03-22 MED ORDER — SODIUM CHLORIDE 0.9 % IV SOLN: 100.0000 mL | INTRAVENOUS | Status: DC | PRN

## 2021-03-22 MED ORDER — PENTAFLUOROPROP-TETRAFLUOROETH EX AERO: 1.0000 "application " | INHALATION_SPRAY | CUTANEOUS | Status: DC | PRN

## 2021-03-22 MED ORDER — HEPARIN SODIUM (PORCINE) 1000 UNIT/ML DIALYSIS
2200.0000 [IU] | Freq: Once | INTRAMUSCULAR | Status: AC
  Administered 2021-03-22: 2200 [IU] via INTRAVENOUS_CENTRAL

## 2021-03-22 MED ORDER — LIDOCAINE-PRILOCAINE 2.5-2.5 % EX CREA: 1.0000 "application " | TOPICAL_CREAM | CUTANEOUS | Status: DC | PRN

## 2021-03-22 MED ORDER — MIDODRINE HCL 5 MG PO TABS
10.0000 mg | ORAL_TABLET | ORAL | Status: DC
  Administered 2021-03-26 – 2021-04-06 (×3): 10 mg via ORAL
  Filled 2021-03-22 (×4): qty 2

## 2021-03-22 MED ORDER — MIDODRINE HCL 5 MG PO TABS
ORAL_TABLET | ORAL | Status: AC
  Filled 2021-03-22: qty 2

## 2021-03-22 MED ORDER — BISACODYL 10 MG RE SUPP
10.0000 mg | Freq: Every day | RECTAL | Status: AC | PRN
Start: 2021-03-22 — End: 2021-03-23
  Administered 2021-03-23: 10 mg via RECTAL
  Filled 2021-03-22: qty 1

## 2021-03-22 NOTE — Progress Notes (Addendum)
[facility] Progress Note   Subjective: Seen on HD. No issues.   Objective Vitals:   03/22/21 0830 03/22/21 0900 03/22/21 0930 03/22/21 1000  BP: 122/66 (!) 83/51 105/60 (!) 93/56  Pulse: 69 67 68 69  Resp: (!) 8 (!) 9 (!) 9 11  Temp:      TempSrc:      SpO2:      Weight:      Height:       Physical Exam General: Pleasant older male in NAD Heart: S1,S2 RRR Lungs: CTAB Abdomen:NABS Extremities: No LE edema Dialysis Access: L AVF cannulated  Additional Objective Labs: Basic Metabolic Panel: Recent Labs  Lab 03/20/21 0324 03/21/21 0450 03/22/21 0452  NA 133* 130* 132*  K 4.2 5.8* 4.4  CL 94* 93* 91*  CO2 '26 22 28  '$ GLUCOSE 107* 107* 131*  BUN 25* 40* 53*  CREATININE 5.28* 7.36* 9.22*  CALCIUM 8.2* 8.5* 8.7*  PHOS 4.6 5.0* 5.1*   Liver Function Tests: Recent Labs  Lab 03/20/21 0324 03/21/21 0450 03/22/21 0452  ALBUMIN 2.5* 2.3* 2.3*   No results for input(s): LIPASE, AMYLASE in the last 168 hours. CBC: Recent Labs  Lab 03/19/21 0415 03/20/21 0324 03/21/21 0450 03/22/21 0452 03/22/21 0830  WBC 12.0* 11.5* 14.2* 8.0 8.9  HGB 10.5* 11.4* 10.9* 10.0* 9.9*  HCT 35.0* 36.7* 34.4* 32.4* 32.8*  MCV 99.4 98.9 94.0 98.5 97.9  PLT 214 227 206 207 227   Blood Culture    Component Value Date/Time   SDES ABSCESS 03/19/2021 1709   SPECREQUEST LEFT PSOAS 03/19/2021 1709   CULT  03/19/2021 1709    FEW STAPHYLOCOCCUS AUREUS NO ANAEROBES ISOLATED; CULTURE IN PROGRESS FOR 5 DAYS    REPTSTATUS PENDING 03/19/2021 1709    Cardiac Enzymes: No results for input(s): CKTOTAL, CKMB, CKMBINDEX, TROPONINI in the last 168 hours. CBG: No results for input(s): GLUCAP in the last 168 hours. Iron Studies: No results for input(s): IRON, TIBC, TRANSFERRIN, FERRITIN in the last 72 hours. '@lablastinr3'$ @ Studies/Results: No results found. Medications:  sodium chloride     sodium chloride      ceFAZolin (ANCEF) IV 1 g (03/21/21 2214)    Chlorhexidine  Gluconate Cloth  6 each Topical Q0600   darbepoetin (ARANESP) injection - DIALYSIS  100 mcg Intravenous Q Fri-HD   doxercalciferol  3 mcg Intravenous Q M,W,F-HD   feeding supplement (NEPRO CARB STEADY)  237 mL Oral BID BM   gabapentin  100 mg Oral QHS   heparin  5,000 Units Subcutaneous Q8H   heparin sodium (porcine)       midodrine  10 mg Oral Q M,W,F-HD   multivitamin  1 tablet Oral QHS   sodium chloride flush  3 mL Intravenous Q12H   sucroferric oxyhydroxide  1,000 mg Oral TID WC     Dialysis Orders:[facility] M[facility]  3h 19mn  180NRe 400/600  64kg   2/2 bath  P4  AVF   -Heparin  2200 units IV TIW -Mircera 150 mcg IV q 2 week (last dose 150 mcg IV 02/28/2021) -Venofer 100 mg IV X 10 doses (4/6 doses given. 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  Neurosurgery evaluated and no intervention deemed necessary.  Seen by  ID input, started cefazolin (as likely MSSA given h/o MSSA bacteremia recently).  IR did CT guided aspiration of 6 cc on  8/8, site to small for drain placement per IR.  Per ID, Continue cefazolin with HD, plan for 6 to 8 weeks course. Tentative end date is 04/30/21. Pulmonary edema: Vascular congestion, small R pleural effusion on CXR but appears mild in comparison to CXR 01/2021. He has not been eating well, has most likely lost body wt. No acute dialysis needs today. No evidence of overt volume overload.   Elevated troponin: In setting of vascular congestion and ESRD. Troponin 84, 101 so far. Denies chest pain. Per primary L face contusion-supportive care per primary Mild hyperkalemia-K+5.8 03/21/21. No hemolysis. Lokelma 10 gram PO now and repeat in 8 hours. Repeat K+ 4.4.   ESRD - Next HD 03/22/2021 due to staffing issues in HD unit. At present time he is not able to sit up in recliner without significant discomfort.  He may need rehab before  he can be discharged to home for outpatient dialysis. Next HD here is planned for Monday.   Hypertension/volume  - As noted above. BP well controlled. On midodrine 10 mg PO TID however notified by pharmacy that many doses have been held D/T high BP. Change to midodrine 10 mg PO TIW 30 prior to HD. May repeat mid HD if necessary. No evidence of overt volume overload by exam on admit. Now very much under EDW. Will need EDW reset on discharge. UF as tolerated.   Anemia  - HGB 10.5.  Missed OP ESA 08/03. Given here 03/16/2021. Give ESA off schedule with HD today. Hold Fe load in setting of infection.   Metabolic bone disease - Continue velphoro 500 mg 2 tabs PO TID AC. Continue VDRA. Labs at goal.   Nutrition - Renal carb mod diet, nepro, renal vits.  DM-No meds on OP list. Last HA1c 6.9. per primary Disposition - will likely need SNF given severe debilitation from back pain and inability to sit up/ambulate. Does he have resources for this care? Consult MSW.  Rita H. Brown NP-C 03/22/2021, 10:13 AM  [facility] [phone]  Pt seen, examined and agree w assess/plan as above with additions as indicated.  [facility] 03/22/2021, 10:51 AM

## 2021-03-22 NOTE — Progress Notes (Signed)
OT Cancellation Note  Patient Details Name: Ryan Wells MRN: [id number] DOB: Mar 19, 1956   Cancelled Treatment:    Reason Eval/Treat Not Completed: Patient at procedure or test/ unavailable (HD. Will attempt later time as able.)  [facility] 03/22/2021, 9:32 AM Maurie Boettcher, OT/L   Acute OT Clinical Specialist Acute Rehabilitation Services Pager [phone] Office [phone]

## 2021-03-22 NOTE — Plan of Care (Signed)

## 2021-03-22 NOTE — Progress Notes (Signed)
PROGRESS NOTE    [name]  [id number] DOB: 1956/02/09 DOA: 03/14/2021 PCP: Pcp, No    Brief Narrative:   Ryan Wells is a 65 y.o. male with medical history of end-stage renal disease on hemodialysis, essential hypertension, diabetes mellitus type 2, anemia of chronic kidney disease,  presented to the hospital with back and leg pain worsening for 2 weeks.  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  It appears during this hospitalization he had MRI imaging which noted bilateral L5-S1 facet osteoarthritis left greater than right with synovial cysts and periarticular soft tissue edema on the left which superimposed septic arthritis could not be excluded and anterior displacement of the cord with distortion at T6-T7.  He had completed 6 weeks of IV antibiotics with dialysis per ID notes from 8/2.  Patient was again seen in the emergency department on 7/25 for radicular pain and neurosurgery had recommended short course of steroids.  On this admission, in the emergency department patient was seen to be afebrile with stable vital signs.  CT imaging of the head and orbits negative for any acute intracranial pathology, but noted left facial and periorbital soft tissue contusion.  Labs from 8/3 significant for WBC 15.1, hemoglobin 10, potassium 4.4, BUN 63, creatinine 8.5, anion gap 20, and high-sensitivity troponin 84->101.  Chest x-ray significant for cardiomegaly with vascular congestion, focal area with pulmonary nodularity of the right mid lung correlates with cavitating lesion and a small right-sided pleural effusion.  Patient admitted hospital for further evaluation and treatment.   Principal Problem:   Lumbar back pain Active Problems:   Subclinical hypothyroidism   Anemia due to chronic kidney disease   Diabetes mellitus type II, non insulin dependent (HCC)    ESRD on hemodialysis (HCC)   Pleural effusion   Elevated troponin   Leg pain   Leukocytosis   History of bacteremia  Lumbar/sacral osteomyelitis/discitis, L5-S1, Septic arthritis L5-S1, POA Bilateral psoas abscess s/p IR aspiration, presented Dr Bridgett Larsson Patient presented with worsening lumbar back pain with radiation.  Had recently completed 6 weeks course of antibiotic as per infectious disease.  MSSA bacteremia on 03/13/2021..  WBC was elevated at 15.1 on presentation.  MRI of the lumbosacral spine on 8/ 5/22 was notable for L5-S1 discitis with osteomyelitis with left psoas abscess.  IR drained 60 mL of purulent fluid from the left psoas region.  Neurosurgery and infectious disease following.  WBC trended down. Blood cultures from 03/15/2021 with no growth so far.  Wound culture from left foot ulcer abscess with few gram-positive cocci.  Continue pain management and cefazolin.  Monitor CBC daily.  Left face contusion:  On CT scan.  No erythema.  Continue supportive care    Elevated troponin:  Mildly elevated.  EKG unremarkable.  Likely secondary to demand ischemia. No chest pain  Right-sided pleural effusion:  Continue hemodialysis for fluid management.  No obvious distress.   ESRD on HD with hyperkalemia.:  Resume hemodialysis as per schedule. Potassium has improved with lokelma and HD today.  Diabetes mellitus type 2:  Latest hemoglobin A1c of 5.1 on 01/16/2021.   Controlled.   History of MSSA bacteremia:  Has previously completed 6 weeks of antibiotic.  Had TEE with no evidence of endocarditis in the past.  On IV cefazolin at this time    Anemia chronic kidney disease:  Baseline hemoglobin of around 8.0.  Latest hemoglobin of  9.9.  We will continue to monitor as outpatient   Subclinical hypothyroidism:  T4 normal.  TSH elevated.  Will need outpatient follow-up and 4 to 6 weeks.  Weakness/deconditioning/gait disturbance: Physical therapy on board and recommending CIR on  discharge.  DVT prophylaxis: heparin injection 5,000 Units Start: 03/15/21 1400   CODE STATUS-full code  Family Communication:  None.  Disposition Plan:  Level of care: Telemetry Medical Status is: Inpatient  Remains inpatient appropriate because:Ongoing diagnostic testing needed not appropriate for outpatient work up, Unsafe d/c plan, IV treatments appropriate due to intensity of illness or inability to take PO, and Inpatient level of care appropriate due to severity of illness  Dispo: The patient is from: Home              Anticipated d/c is to: Home              Patient currently is not medically stable to d/c.   Difficult to place patient No   Consultants:  Neurosurgery Nephrology Infectious disease Interventional radiology  Procedures:  IR guided aspiration of the left psoas abscess on 03/19/2021 removal of 60 mL purulent fluid.Marland Kitchen  Antimicrobials:  Ceftriaxone 8/4 - 8/6 Cefazolin 8/6>>   Subjective: Interpreted with help of video interpreter.  Patient was seen and examined at bedside.  Complains of mild back pain.  Wishes his leg still be elevated.  Seen after hemodialysis.  Objective: Vitals:   03/22/21 1200 03/22/21 1216 03/22/21 1230 03/22/21 1305  BP: 112/66 120/64 (!) 111/59 117/61  Pulse: 70 72 71 72  Resp: (!) '6 14 10 12  '$ Temp:  98.7 F (37.1 C) 98.7 F (37.1 C) 98 F (36.7 C)  TempSrc:  Oral Oral Oral  SpO2:   100% 100%  Weight:   57.7 kg   Height:        Intake/Output Summary (Last 24 hours) at 03/22/2021 1452 Last data filed at 03/22/2021 1230 Gross per 24 hour  Intake 123 ml  Output 0 ml  Net 123 ml    Filed Weights   03/19/21 0818 03/22/21 0825 03/22/21 1230  Weight: 60.1 kg 57.7 kg 57.7 kg    Physical examination: General:  Average built, not in obvious distress, poor dentition, alert awake and communicative. HENT:   No scleral pallor or icterus noted. Oral mucosa is moist.  Chest: Diminished breath sounds bilaterally CVS: S1 &S2  heard. No murmur.  Regular rate and rhythm.  No specific tenderness of the lower back.   Abdomen: Soft, nontender, nondistended.  Bowel sounds are heard.   Extremities: No cyanosis, clubbing or edema.  Peripheral pulses are palpable. Psych: Alert, awake and oriented, normal mood CNS: Moving extremities. Skin: Warm and dry.  No rashes noted.   Data Reviewed: I have personally reviewed the following labs and imaging studies   CBC: Recent Labs  Lab 03/19/21 0415 03/20/21 0324 03/21/21 0450 03/22/21 0452 03/22/21 0830  WBC 12.0* 11.5* 14.2* 8.0 8.9  HGB 10.5* 11.4* 10.9* 10.0* 9.9*  HCT 35.0* 36.7* 34.4* 32.4* 32.8*  MCV 99.4 98.9 94.0 98.5 97.9  PLT 214 227 206 207 [id number]    Basic Metabolic Panel: Recent Labs  Lab 03/19/21 0415 03/20/21 0324 03/21/21 0450 03/22/21 0452 03/22/21 0830  NA 130* 133* 130* 132* 131*  K 4.7 4.2 5.8* 4.4 4.3  CL 89* 94* 93* 91* 90*  CO2 '26 26 22 28 28  '$ GLUCOSE 180* 107* 107* 131* 122*  BUN 60* 25* 40* 53* 53*  CREATININE 9.22* 5.28* 7.36*  9.22* 9.41*  CALCIUM 8.5* 8.2* 8.5* 8.7* 8.8*  PHOS 5.0* 4.6 5.0* 5.1* 4.9*    GFR: Estimated Creatinine Clearance: 6 mL/min (A) (by C-G formula based on SCr of 9.41 mg/dL (H)). Liver Function Tests: Recent Labs  Lab 03/19/21 0415 03/20/21 0324 03/21/21 0450 03/22/21 0452 03/22/21 0830  ALBUMIN 2.4* 2.5* 2.3* 2.3* 2.4*    No results for input(s): LIPASE, AMYLASE in the last 168 hours. No results for input(s): AMMONIA in the last 168 hours. Coagulation Profile: Recent Labs  Lab 03/19/21 0415  INR 1.1    Cardiac Enzymes: No results for input(s): CKTOTAL, CKMB, CKMBINDEX, TROPONINI in the last 168 hours. BNP (last 3 results) No results for input(s): PROBNP in the last 8760 hours. HbA1C: No results for input(s): HGBA1C in the last 72 hours. CBG: No results for input(s): GLUCAP in the last 168 hours. Lipid Profile: No results for input(s): CHOL, HDL, LDLCALC, TRIG, CHOLHDL, LDLDIRECT in the  last 72 hours. Thyroid Function Tests: No results for input(s): TSH, T4TOTAL, FREET4, T3FREE, THYROIDAB in the last 72 hours.  Anemia Panel: No results for input(s): VITAMINB12, FOLATE, FERRITIN, TIBC, IRON, RETICCTPCT in the last 72 hours. Sepsis Labs: No results for input(s): PROCALCITON, LATICACIDVEN in the last 168 hours.  Recent Results (from the past 240 hour(s))  Resp Panel by RT-PCR (Flu A&B, Covid) Nasopharyngeal Swab     Status: None   Collection Time: 03/15/21  2:08 AM   Specimen: Nasopharyngeal Swab; Nasopharyngeal(NP) swabs in vial transport medium  Result Value Ref Range Status   SARS Coronavirus 2 by RT PCR NEGATIVE NEGATIVE Final    Comment: (NOTE) SARS-CoV-2 target nucleic acids are NOT DETECTED.  The SARS-CoV-2 RNA is generally detectable in upper respiratory specimens during the acute phase of infection. The lowest concentration of SARS-CoV-2 viral copies this assay can detect is 138 copies/mL. A negative result does not preclude SARS-Cov-2 infection and should not be used as the sole basis for treatment or other patient management decisions. A negative result may occur with  improper specimen collection/handling, submission of specimen other than nasopharyngeal swab, presence of viral mutation(s) within the areas targeted by this assay, and inadequate number of viral copies(<138 copies/mL). A negative result must be combined with clinical observations, patient history, and epidemiological information. The expected result is Negative.  Fact Sheet for Patients:  EntrepreneurPulse.com.au  Fact Sheet for Healthcare Providers:  IncredibleEmployment.be  This test is no t yet approved or cleared by the [place] FDA and  has been authorized for detection and/or diagnosis of SARS-CoV-2 by FDA under an Emergency Use Authorization (EUA). This EUA will remain  in effect (meaning this test can be used) for the duration of  the COVID-19 declaration under Section 564(b)(1) of the Act, 21 U.S.C.section 360bbb-3(b)(1), unless the authorization is terminated  or revoked sooner.       Influenza A by PCR NEGATIVE NEGATIVE Final   Influenza B by PCR NEGATIVE NEGATIVE Final    Comment: (NOTE) The Xpert Xpress SARS-CoV-2/FLU/RSV plus assay is intended as an aid in the diagnosis of influenza from Nasopharyngeal swab specimens and should not be used as a sole basis for treatment. Nasal washings and aspirates are unacceptable for Xpert Xpress SARS-CoV-2/FLU/RSV testing.  Fact Sheet for Patients: EntrepreneurPulse.com.au  Fact Sheet for Healthcare Providers: IncredibleEmployment.be  This test is not yet approved or cleared by the [place] FDA and has been authorized for detection and/or diagnosis of SARS-CoV-2 by FDA under an Emergency Use Authorization (EUA). This EUA  will remain in effect (meaning this test can be used) for the duration of the COVID-19 declaration under Section 564(b)(1) of the Act, 21 U.S.C. section 360bbb-3(b)(1), unless the authorization is terminated or revoked.  Performed at [facility], [address] [place], [place] [address]   Culture, blood (routine x 2)     Status: None   Collection Time: 03/15/21  3:39 PM   Specimen: BLOOD  Result Value Ref Range Status   Specimen Description BLOOD RIGHT ANTECUBITAL  Final   Special Requests   Final    BOTTLES DRAWN AEROBIC AND ANAEROBIC Blood Culture results may not be optimal due to an inadequate volume of blood received in culture bottles   Culture   Final    NO GROWTH 5 DAYS Performed at [facility], [place] [address] [place], [place] [address]    Report Status 03/20/2021 FINAL  Final  Culture, blood (routine x 2)     Status: None   Collection Time: 03/15/21  3:47 PM   Specimen: BLOOD RIGHT HAND  Result Value Ref Range Status   Specimen Description BLOOD RIGHT HAND   Final   Special Requests   Final    BOTTLES DRAWN AEROBIC AND ANAEROBIC Blood Culture adequate volume   Culture   Final    NO GROWTH 5 DAYS Performed at [facility], [place] [address] [place], [place] [address]    Report Status 03/20/2021 FINAL  Final  Aerobic/Anaerobic Culture w Gram Stain (surgical/deep wound)     Status: None (Preliminary result)   Collection Time: 03/19/21  5:09 PM   Specimen: Abscess  Result Value Ref Range Status   Specimen Description ABSCESS  Final   Special Requests LEFT PSOAS  Final   Gram Stain   Final    ABUNDANT WBC PRESENT,BOTH PMN AND MONONUCLEAR FEW GRAM POSITIVE COCCI Performed at [facility], [place] [address] [place], [place] [address]    Culture   Final    FEW STAPHYLOCOCCUS AUREUS NO ANAEROBES ISOLATED; CULTURE IN PROGRESS FOR 5 DAYS    Report Status PENDING  Incomplete   Organism ID, Bacteria STAPHYLOCOCCUS AUREUS  Final      Susceptibility   Staphylococcus aureus - MIC*    CIPROFLOXACIN <=0.5 SENSITIVE Sensitive     ERYTHROMYCIN <=0.25 SENSITIVE Sensitive     GENTAMICIN <=0.5 SENSITIVE Sensitive     OXACILLIN 0.5 SENSITIVE Sensitive     TETRACYCLINE <=1 SENSITIVE Sensitive     VANCOMYCIN 1 SENSITIVE Sensitive     TRIMETH/SULFA <=10 SENSITIVE Sensitive     CLINDAMYCIN <=0.25 SENSITIVE Sensitive     RIFAMPIN <=0.5 SENSITIVE Sensitive     Inducible Clindamycin NEGATIVE Sensitive     * FEW STAPHYLOCOCCUS AUREUS      Radiology Studies: No results found.    Scheduled Meds:  Chlorhexidine Gluconate Cloth  6 each Topical Q0600   [START ON 03/26/2021] darbepoetin (ARANESP) injection - DIALYSIS  100 mcg Intravenous Q Mon-HD   [START ON 03/26/2021] doxercalciferol  3 mcg Intravenous Q M,W,F-HD   feeding supplement (NEPRO CARB STEADY)  237 mL Oral BID BM   gabapentin  100 mg Oral QHS   heparin  5,000 Units Subcutaneous Q8H   heparin sodium (porcine)       midodrine       [START ON 03/26/2021] midodrine  10 mg Oral Q M,W,F-HD    multivitamin  1 tablet Oral QHS   sodium chloride flush  3 mL Intravenous Q12H  sucroferric oxyhydroxide  1,000 mg Oral TID WC   Continuous Infusions:   ceFAZolin (ANCEF) IV 1 g (03/21/21 2214)     LOS: 7 days    Flora Lipps, MD [facility] 03/22/2021, 2:52 PM

## 2021-03-22 NOTE — Progress Notes (Addendum)
PT Cancellation Note  Patient Details Name: Ryan Wells MRN: [id number] DOB: 1955/08/21   Cancelled Treatment:    Reason Eval/Treat Not Completed: Patient at procedure or test/unavailable Pt off floor for HD.  Will f/u as able.    Addendum: When pt returned, no +2 assist available and for effective treatment needs +2.  Will f/u tomorrow.   Abran Richard, PT Acute Rehab Services Pager [phone] [facility] [phone]   Karlton Lemon 03/22/2021, 11:07 AM

## 2021-03-22 NOTE — Progress Notes (Signed)
Patient back to room 2W12. NAD noted.

## 2021-03-22 NOTE — Progress Notes (Signed)
Patient transported via bed to HD at this time. Patient hemodynamically stable during transport.

## 2021-03-23 DIAGNOSIS — R778 Other specified abnormalities of plasma proteins: Secondary | ICD-10-CM | POA: Diagnosis not present

## 2021-03-23 DIAGNOSIS — M545 Low back pain, unspecified: Secondary | ICD-10-CM | POA: Diagnosis not present

## 2021-03-23 DIAGNOSIS — N186 End stage renal disease: Secondary | ICD-10-CM | POA: Diagnosis not present

## 2021-03-23 DIAGNOSIS — E119 Type 2 diabetes mellitus without complications: Secondary | ICD-10-CM | POA: Diagnosis not present

## 2021-03-23 LAB — RENAL FUNCTION PANEL
Albumin: 2.4 g/dL — ABNORMAL LOW (ref 3.5–5.0)
Anion gap: 11 (ref 5–15)
BUN: 24 mg/dL — ABNORMAL HIGH (ref 8–23)
CO2: 28 mmol/L (ref 22–32)
Calcium: 8.8 mg/dL — ABNORMAL LOW (ref 8.9–10.3)
Chloride: 96 mmol/L — ABNORMAL LOW (ref 98–111)
Creatinine, Ser: 4.75 mg/dL — ABNORMAL HIGH (ref 0.61–1.24)
GFR, Estimated: 13 mL/min — ABNORMAL LOW (ref >60)
Glucose, Bld: 143 mg/dL — ABNORMAL HIGH (ref 70–99)
Phosphorus: 3 mg/dL (ref 2.5–4.6)
Potassium: 4.1 mmol/L (ref 3.5–5.1)
Sodium: 135 mmol/L (ref 135–145)

## 2021-03-23 LAB — CBC
HCT: 32.8 % — ABNORMAL LOW (ref 39.0–52.0)
Hemoglobin: 9.8 g/dL — ABNORMAL LOW (ref 13.0–17.0)
MCH: 29.8 pg (ref 26.0–34.0)
MCHC: 29.9 g/dL — ABNORMAL LOW (ref 30.0–36.0)
MCV: 99.7 fL (ref 80.0–100.0)
Platelets: 219 10*3/uL (ref 150–400)
RBC: 3.29 MIL/uL — ABNORMAL LOW (ref 4.22–5.81)
RDW: 16.9 % — ABNORMAL HIGH (ref 11.5–15.5)
WBC: 6.7 10*3/uL (ref 4.0–10.5)
nRBC: 0 % (ref 0.0–0.2)

## 2021-03-23 MED ORDER — BISACODYL 10 MG RE SUPP
10.0000 mg | Freq: Every day | RECTAL | Status: AC | PRN
  Administered 2021-03-23: 10 mg via RECTAL
  Filled 2021-03-23: qty 1

## 2021-03-23 MED ORDER — DICLOFENAC EPOLAMINE 1.3 % EX PTCH
1.0000 | MEDICATED_PATCH | Freq: Two times a day (BID) | CUTANEOUS | Status: DC
  Administered 2021-03-23 – 2021-04-07 (×31): 1 via TRANSDERMAL
  Filled 2021-03-23 (×31): qty 1

## 2021-03-23 MED ORDER — POLYETHYLENE GLYCOL 3350 17 G PO PACK
17.0000 g | PACK | Freq: Every day | ORAL | Status: DC
  Administered 2021-03-23 – 2021-03-25 (×3): 17 g via ORAL
  Filled 2021-03-23 (×3): qty 1

## 2021-03-23 MED ORDER — LIDOCAINE 5 % EX PTCH
1.0000 | MEDICATED_PATCH | CUTANEOUS | Status: DC
  Administered 2021-03-23 – 2021-04-07 (×16): 1 via TRANSDERMAL
  Filled 2021-03-23 (×17): qty 1

## 2021-03-23 MED ORDER — OXYCODONE-ACETAMINOPHEN 5-325 MG PO TABS
1.0000 | ORAL_TABLET | ORAL | Status: DC | PRN
  Administered 2021-03-24 (×2): 1 via ORAL
  Administered 2021-03-25 – 2021-03-28 (×4): 2 via ORAL
  Administered 2021-03-29: 1 via ORAL
  Administered 2021-03-29 – 2021-04-01 (×8): 2 via ORAL
  Filled 2021-03-23: qty 1
  Filled 2021-03-23: qty 2
  Filled 2021-03-23 (×2): qty 1
  Filled 2021-03-23: qty 2
  Filled 2021-03-23: qty 1
  Filled 2021-03-23: qty 2
  Filled 2021-03-23: qty 1
  Filled 2021-03-23 (×8): qty 2

## 2021-03-23 NOTE — Progress Notes (Signed)
PROGRESS NOTE    [name]  [id number] DOB: [birth year] DOA: 03/14/2021 PCP: Pcp, No    Brief Narrative:   Ryan Wells is a 65 y.o. male with medical history of end-stage renal disease on hemodialysis, essential hypertension, diabetes mellitus type 2, anemia of chronic kidney disease,  presented to the hospital with back and leg pain worsening for 2 weeks.  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  It appears during this hospitalization he had MRI imaging which noted bilateral L5-S1 facet osteoarthritis left greater than right with synovial cysts and periarticular soft tissue edema on the left which superimposed septic arthritis could not be excluded and anterior displacement of the cord with distortion at T6-T7.  He had completed 6 weeks of IV antibiotics with dialysis per ID notes from 8/2.  Patient was again seen in the emergency department on 7/25 for radicular pain and neurosurgery had recommended short course of steroids.  On this admission, in the emergency department patient was seen to be afebrile with stable vital signs.  CT imaging of the head and orbits negative for any acute intracranial pathology, but noted left facial and periorbital soft tissue contusion.  Labs from 8/3 significant for WBC 15.1, hemoglobin 10, potassium 4.4, BUN 63, creatinine 8.5, anion gap 20, and high-sensitivity troponin 84->101.  Chest x-ray significant for cardiomegaly with vascular congestion, focal area with pulmonary nodularity of the right mid lung correlates with cavitating lesion and a small right-sided pleural effusion.  Patient was then admitted to the hospital for further evaluation and treatment.   Principal Problem:   Lumbar back pain Active Problems:   Subclinical hypothyroidism   Anemia due to chronic kidney disease   Diabetes mellitus type II, non insulin  dependent (HCC)   ESRD on hemodialysis (HCC)   Pleural effusion   Elevated troponin   Leg pain   Leukocytosis   History of bacteremia  Lumbar/sacral osteomyelitis/discitis, L5-S1, Septic arthritis L5-S1, and on admission Bilateral psoas abscess s/p IR aspiration Patient presented with worsening lumbar back pain with radiation of pain..  Had recently completed 6 weeks course of antibiotic as per infectious disease.  History of MSSA bacteremia on 03/13/2021..  WBC was elevated at 15.1 on presentation.  MRI of the lumbosacral spine on 8/ 5/22 was notable for L5-S1 discitis with osteomyelitis with left psoas abscess.  IR drained 60 mL of purulent fluid from the left psoas region.  Neurosurgery and infectious disease followed the patient during hospitalization.  WBC has normalized.   Blood cultures from 03/15/2021 with no growth so far.  Fluid culture from aspiration with few Staph aureus sensitive to all antibiotics.  Continue pain management and IV cefazolin.  Plan to continue cefazolin with hemodialysis for 6 to 8 weeks.  Tentative end date of 04/30/2021  Intractable back pain.  Limiting physical therapy.  On Dilaudid IV and oral oxycodone.  We will change oxycodone 1 to 2 tablets every 4 hourly.  Add Lidoderm and diclofenac patch on the back.  Left face contusion:  On CT scan.  No erythema.  Continue supportive care    Elevated troponin:  Mildly elevated.  EKG unremarkable.  Likely secondary to demand ischemia. No chest pain  Right-sided pleural effusion:  Continue hemodialysis for fluid management.  No obvious distress.   ESRD on HD with hyperkalemia.:  On hemodialysis as per nephrology.  Potassium is controlled at this time.  Diabetes mellitus type 2:  Latest hemoglobin A1c of 5.1 on 01/16/2021.   Controlled.   History of MSSA bacteremia:  Has previously completed 6 weeks of antibiotic.  Had TEE with no evidence of endocarditis in the past.  On IV cefazolin at this time.  Plan to continue for  6 to 8 weeks as per ID recommendation.   Anemia chronic kidney disease:  Baseline hemoglobin of around 8.0.  Latest hemoglobin of 9.8.  We will continue to monitor as outpatient   Subclinical hypothyroidism:  T4 normal.  TSH elevated.  Will need outpatient follow-up and repeat testing in 4 to 6 weeks.  Weakness/deconditioning/gait disturbance: Physical therapy on board and recommending CIR on discharge.  DVT prophylaxis: heparin injection 5,000 Units Start: 03/15/21 1400   CODE STATUS-full code  Family Communication:  None.  Disposition Plan:   Level of care: Telemetry Medical Status is: Inpatient  Remains inpatient appropriate because: Unsafe d/c plan, IV treatments appropriate due to intensity of illness or inability to take PO, and Inpatient level of care appropriate due to severity of illness  Dispo: The patient is from: Home              Anticipated d/c is to: CIR as per physical therapy evaluation today.              Patient currently is medically stable to d/c.   Difficult to place patient No   Consultants:  Neurosurgery Nephrology Infectious disease Interventional radiology  Procedures:  IR guided aspiration of the left psoas abscess on 03/19/2021 with removal of 60 mL purulent fluid.Marland Kitchen  Antimicrobials:  Ceftriaxone 8/4 - 8/6 Cefazolin 8/6>>  Subjective: Today, patient was seen and examined at bedside.  Complains of severe back pain limiting his ambulation but he did walk few steps today.  Communicated with the help of video interpreter.  Objective: Vitals:   03/22/21 1230 03/22/21 1305 03/22/21 2051 03/23/21 0747  BP: (!) 111/59 117/61 125/64 128/65  Pulse: 71 72 69 68  Resp: '10 12 18 18  '$ Temp: 98.7 F (37.1 C) 98 F (36.7 C) 98.3 F (36.8 C)   TempSrc: Oral Oral    SpO2: 100% 100% 98% 98%  Weight: 57.7 kg     Height:        Intake/Output Summary (Last 24 hours) at 03/23/2021 0837 Last data filed at 03/23/2021 0814 Gross per 24 hour  Intake 3 ml   Output 0 ml  Net 3 ml     Filed Weights   03/19/21 0818 03/22/21 0825 03/22/21 1230  Weight: 60.1 kg 57.7 kg 57.7 kg    Physical examination: General:  Average built, mild distress due to pain, poor dentition, alert awake and communicative HENT:   No scleral pallor or icterus noted. Oral mucosa is moist.  Chest:  Clear breath sounds.   No crackles or wheezes.  CVS: S1 &S2 heard. No murmur.  Regular rate and rhythm. Abdomen: Soft, nontender, nondistended.  Bowel sounds are heard.  Nonspecific tenderness over the back. Extremities: No cyanosis, clubbing or edema.  Peripheral pulses are palpable. Psych: Alert, awake and oriented, normal mood CNS:  No cranial nerve deficits.  Moving extremities. Skin: Warm and dry.  No rashes noted.   Data Reviewed: I have personally reviewed the following labs and imaging studies   CBC: Recent Labs  Lab 03/20/21 0324 03/21/21 0450 03/22/21 0452 03/22/21 0830 03/23/21 0206  WBC 11.5* 14.2* 8.0 8.9 6.7  HGB 11.4* 10.9* 10.0* 9.9* 9.8*  HCT 36.7*  34.4* 32.4* 32.8* 32.8*  MCV 98.9 94.0 98.5 97.9 99.7  PLT 227 206 207 227 [id number]    Basic Metabolic Panel: Recent Labs  Lab 03/20/21 0324 03/21/21 0450 03/22/21 0452 03/22/21 0830 03/23/21 0206  NA 133* 130* 132* 131* 135  K 4.2 5.8* 4.4 4.3 4.1  CL 94* 93* 91* 90* 96*  CO2 '26 22 28 28 28  '$ GLUCOSE 107* 107* 131* 122* 143*  BUN 25* 40* 53* 53* 24*  CREATININE 5.28* 7.36* 9.22* 9.41* 4.75*  CALCIUM 8.2* 8.5* 8.7* 8.8* 8.8*  PHOS 4.6 5.0* 5.1* 4.9* 3.0    GFR: Estimated Creatinine Clearance: 11.8 mL/min (A) (by C-G formula based on SCr of 4.75 mg/dL (H)). Liver Function Tests: Recent Labs  Lab 03/20/21 0324 03/21/21 0450 03/22/21 0452 03/22/21 0830 03/23/21 0206  ALBUMIN 2.5* 2.3* 2.3* 2.4* 2.4*    No results for input(s): LIPASE, AMYLASE in the last 168 hours. No results for input(s): AMMONIA in the last 168 hours. Coagulation Profile: Recent Labs  Lab 03/19/21 0415  INR  1.1    Cardiac Enzymes: No results for input(s): CKTOTAL, CKMB, CKMBINDEX, TROPONINI in the last 168 hours. BNP (last 3 results) No results for input(s): PROBNP in the last 8760 hours. HbA1C: No results for input(s): HGBA1C in the last 72 hours. CBG: No results for input(s): GLUCAP in the last 168 hours. Lipid Profile: No results for input(s): CHOL, HDL, LDLCALC, TRIG, CHOLHDL, LDLDIRECT in the last 72 hours. Thyroid Function Tests: No results for input(s): TSH, T4TOTAL, FREET4, T3FREE, THYROIDAB in the last 72 hours.  Anemia Panel: No results for input(s): VITAMINB12, FOLATE, FERRITIN, TIBC, IRON, RETICCTPCT in the last 72 hours. Sepsis Labs: No results for input(s): PROCALCITON, LATICACIDVEN in the last 168 hours.  Recent Results (from the past 240 hour(s))  Resp Panel by RT-PCR (Flu A&B, Covid) Nasopharyngeal Swab     Status: None   Collection Time: 03/15/21  2:08 AM   Specimen: Nasopharyngeal Swab; Nasopharyngeal(NP) swabs in vial transport medium  Result Value Ref Range Status   SARS Coronavirus 2 by RT PCR NEGATIVE NEGATIVE Final    Comment: (NOTE) SARS-CoV-2 target nucleic acids are NOT DETECTED.  The SARS-CoV-2 RNA is generally detectable in upper respiratory specimens during the acute phase of infection. The lowest concentration of SARS-CoV-2 viral copies this assay can detect is 138 copies/mL. A negative result does not preclude SARS-Cov-2 infection and should not be used as the sole basis for treatment or other patient management decisions. A negative result may occur with  improper specimen collection/handling, submission of specimen other than nasopharyngeal swab, presence of viral mutation(s) within the areas targeted by this assay, and inadequate number of viral copies(<138 copies/mL). A negative result must be combined with clinical observations, patient history, and epidemiological information. The expected result is Negative.  Fact Sheet for Patients:   EntrepreneurPulse.com.au  Fact Sheet for Healthcare Providers:  IncredibleEmployment.be  This test is no t yet approved or cleared by the [place] FDA and  has been authorized for detection and/or diagnosis of SARS-CoV-2 by FDA under an Emergency Use Authorization (EUA). This EUA will remain  in effect (meaning this test can be used) for the duration of the COVID-19 declaration under Section 564(b)(1) of the Act, 21 U.S.C.section 360bbb-3(b)(1), unless the authorization is terminated  or revoked sooner.       Influenza A by PCR NEGATIVE NEGATIVE Final   Influenza B by PCR NEGATIVE NEGATIVE Final    Comment: (NOTE) The Xpert Xpress SARS-CoV-2/FLU/RSV  plus assay is intended as an aid in the diagnosis of influenza from Nasopharyngeal swab specimens and should not be used as a sole basis for treatment. Nasal washings and aspirates are unacceptable for Xpert Xpress SARS-CoV-2/FLU/RSV testing.  Fact Sheet for Patients: EntrepreneurPulse.com.au  Fact Sheet for Healthcare Providers: IncredibleEmployment.be  This test is not yet approved or cleared by the [place] FDA and has been authorized for detection and/or diagnosis of SARS-CoV-2 by FDA under an Emergency Use Authorization (EUA). This EUA will remain in effect (meaning this test can be used) for the duration of the COVID-19 declaration under Section 564(b)(1) of the Act, 21 U.S.C. section 360bbb-3(b)(1), unless the authorization is terminated or revoked.  Performed at [facility], [address] [place], [place] [address]   Culture, blood (routine x 2)     Status: None   Collection Time: 03/15/21  3:39 PM   Specimen: BLOOD  Result Value Ref Range Status   Specimen Description BLOOD RIGHT ANTECUBITAL  Final   Special Requests   Final    BOTTLES DRAWN AEROBIC AND ANAEROBIC Blood Culture results may not be optimal due to an  inadequate volume of blood received in culture bottles   Culture   Final    NO GROWTH 5 DAYS Performed at [facility], [place] [address] [place], [place] [address]    Report Status 03/20/2021 FINAL  Final  Culture, blood (routine x 2)     Status: None   Collection Time: 03/15/21  3:47 PM   Specimen: BLOOD RIGHT HAND  Result Value Ref Range Status   Specimen Description BLOOD RIGHT HAND  Final   Special Requests   Final    BOTTLES DRAWN AEROBIC AND ANAEROBIC Blood Culture adequate volume   Culture   Final    NO GROWTH 5 DAYS Performed at [facility], [place] [address], [place], [place] [address]    Report Status 03/20/2021 FINAL  Final  Aerobic/Anaerobic Culture w Gram Stain (surgical/deep wound)     Status: None (Preliminary result)   Collection Time: 03/19/21  5:09 PM   Specimen: Abscess  Result Value Ref Range Status   Specimen Description ABSCESS  Final   Special Requests LEFT PSOAS  Final   Gram Stain   Final    ABUNDANT WBC PRESENT,BOTH PMN AND MONONUCLEAR FEW GRAM POSITIVE COCCI Performed at [facility], [place] [address], [place], [place] [address]    Culture   Final    FEW STAPHYLOCOCCUS AUREUS NO ANAEROBES ISOLATED; CULTURE IN PROGRESS FOR 5 DAYS    Report Status PENDING  Incomplete   Organism ID, Bacteria STAPHYLOCOCCUS AUREUS  Final      Susceptibility   Staphylococcus aureus - MIC*    CIPROFLOXACIN <=0.5 SENSITIVE Sensitive     ERYTHROMYCIN <=0.25 SENSITIVE Sensitive     GENTAMICIN <=0.5 SENSITIVE Sensitive     OXACILLIN 0.5 SENSITIVE Sensitive     TETRACYCLINE <=1 SENSITIVE Sensitive     VANCOMYCIN 1 SENSITIVE Sensitive     TRIMETH/SULFA <=10 SENSITIVE Sensitive     CLINDAMYCIN <=0.25 SENSITIVE Sensitive     RIFAMPIN <=0.5 SENSITIVE Sensitive     Inducible Clindamycin NEGATIVE Sensitive     * FEW STAPHYLOCOCCUS AUREUS      Radiology Studies: No results found.    Scheduled Meds:  Chlorhexidine Gluconate Cloth  6 each Topical Q0600    [START ON 03/26/2021] darbepoetin (ARANESP) injection - DIALYSIS  100 mcg Intravenous Q Mon-HD   [START  ON 03/26/2021] doxercalciferol  3 mcg Intravenous Q M,W,F-HD   feeding supplement (NEPRO CARB STEADY)  237 mL Oral BID BM   gabapentin  100 mg Oral QHS   heparin  5,000 Units Subcutaneous Q8H   [START ON 03/26/2021] midodrine  10 mg Oral Q M,W,F-HD   multivitamin  1 tablet Oral QHS   polyethylene glycol  17 g Oral Daily   senna-docusate  2 tablet Oral QHS   sodium chloride flush  3 mL Intravenous Q12H   sucroferric oxyhydroxide  1,000 mg Oral TID WC   Continuous Infusions:   ceFAZolin (ANCEF) IV 1 g (03/22/21 2137)     LOS: 8 days    Flora Lipps, MD [facility] 03/23/2021, 8:37 AM

## 2021-03-23 NOTE — Progress Notes (Signed)
Occupational Therapy Treatment Patient Details Name: Ryan Wells MRN: [id number] DOB: 05/26/1956 Today's Date: 03/23/2021    History of present illness Pt is 65 y.o. male presenting to ED on 8/3 with severe back/leg pain x2 weeks and diaphoresis.  Pt admitted with lumbar sacral osteomyelitis/discitis with septic arthritis L5-S1 and bil psoas abscess, R sided pleural effusion. . 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 At time of previous admission MRI (+) L5-S1 facet osteoarthritis, synovial cysts and periarticular soft tissue edema on the left. 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 Returned to ED 7/25 c/o radicular low back pain.  PMHx significant for ESRD on HD, HTN, DMII, chornic anemia and MSSA.   OT comments  [employer] interpreter Sol Blazing used during session. Pt premedicated. Able to progress OOB and ambulated 20 ft using upright RW with min A +2 for safety. Pt left in chair and goal is to sit x 1 hr. Completed grooming tasks once seated in chair. Messaged MD to inquire if anti-anxiety meds would be helpful in addition to pain meds. Will add pressure redistribution pad to chair to attempt to increase comfort with sitting. Educated pt on importance of mobilizing although he is painful. Pt verbalized understanding and was very appreciative. Will continue to follow acutely.  Follow Up Recommendations  Supervision/Assistance - 24 hour;CIR;Other (comment)    Equipment Recommendations  3 in 1 bedside commode    Recommendations for Other Services Rehab consult    Precautions / Restrictions Precautions Precautions: Fall Precaution Comments: Back pain - back precautions for comfort       Mobility Bed Mobility Overal bed mobility: Needs Assistance     Sidelying to sit: Mod assist;+2 for safety/equipment       General bed mobility comments: rolled toward L bed due to room set up. Definately does better rolling  toward R side of bed.    Transfers Overall transfer level: Needs assistance     Sit to Stand: Min assist;+2 physical assistance              Balance     Sitting balance-Leahy Scale: Fair       Standing balance-Leahy Scale: Poor                             ADL either performed or assessed with clinical judgement   ADL       Grooming: Set up;Sitting   Upper Body Bathing: Set up;Sitting                           Functional mobility during ADLs: Moderate assistance;+2 for safety/equipment (upright walker)       Vision       Perception     Praxis      Cognition Arousal/Alertness: Awake/alert Behavior During Therapy: Anxious Overall Cognitive Status: No family/caregiver present to determine baseline cognitive functioning                                 General Comments: most likely baseline        Exercises General Exercises - Lower Extremity Ankle Circles/Pumps: AROM;Both;10 reps;Supine Heel Slides: AROM;Both;10 reps;Supine Other Exercises Other Exercises: began education on use of level 1 theraband for UE strengthening   Shoulder Instructions       General Comments Ambulated 20 ft to  door using upright walker    Pertinent Vitals/ Pain       Pain Assessment: Faces Faces Pain Scale: Hurts worst Pain Location: back and L buttocks; legs Pain Descriptors / Indicators: Discomfort;Grimacing;Guarding;Moaning Pain Intervention(s): Limited activity within patient's tolerance;Premedicated before session;Patient requesting pain meds-RN notified  Home Living                                          Prior Functioning/Environment              Frequency  Min 2X/week        Progress Toward Goals  OT Goals(current goals can now be found in the care plan section)  Progress towards OT goals: Progressing toward goals  Acute Rehab OT Goals Patient Stated Goal: to be able to do for myself OT  Goal Formulation: With patient Time For Goal Achievement: 03/31/21 Potential to Achieve Goals: Good ADL Goals Pt Will Perform Upper Body Dressing: with supervision;sitting Pt Will Perform Lower Body Dressing: with supervision;sit to/from stand;with adaptive equipment Pt Will Transfer to Toilet: with supervision;bedside commode Pt Will Perform Toileting - Clothing Manipulation and hygiene: with supervision;sit to/from stand  Plan Discharge plan remains appropriate    Co-evaluation                 AM-PAC OT "6 Clicks" Daily Activity     Outcome Measure   Help from another person eating meals?: None Help from another person taking care of personal grooming?: A Little Help from another person toileting, which includes using toliet, bedpan, or urinal?: A Lot Help from another person bathing (including washing, rinsing, drying)?: A Lot Help from another person to put on and taking off regular upper body clothing?: A Little Help from another person to put on and taking off regular lower body clothing?: A Lot 6 Click Score: 16    End of Session Equipment Utilized During Treatment: Gait belt;Other (comment) (upright walker)  OT Visit Diagnosis: Unsteadiness on feet (R26.81);Repeated falls (R29.6);Muscle weakness (generalized) (M62.81);Pain Pain - Right/Left: Left Pain - part of body: Leg (butt;back)   Activity Tolerance Patient limited by pain   Patient Left in chair;with call bell/phone within reach;with chair alarm set   Nurse Communication Mobility status;Patient requests pain meds        Time: 0920-1000 OT Time Calculation (min): 40 min  Charges: OT General Charges $OT Visit: 1 Visit OT Treatments $Self Care/Home Management : 23-37 mins $Therapeutic Exercise: 8-22 mins  Maurie Boettcher, OT/L   Acute OT Clinical Specialist Acute Rehabilitation Services Pager [phone] Office [phone]    [facility] 03/23/2021, 10:21 AM

## 2021-03-23 NOTE — Progress Notes (Signed)
[facility] Progress Note   Subjective:    Seen and examined patient at bedside. Seen exercising L leg with resistance band. Received HD on 8/11-ran even. Computer interpreter was utilized. Denies SOB and CP. Plan for HD on 03/26/21.   Objective Vitals:   03/22/21 1230 03/22/21 1305 03/22/21 2051 03/23/21 0747  BP: (!) 111/59 117/61 125/64 128/65  Pulse: 71 72 69 68  Resp: '10 12 18 18  '$ Temp: 98.7 F (37.1 C) 98 F (36.7 C) 98.3 F (36.8 C)   TempSrc: Oral Oral    SpO2: 100% 100% 98% 98%  Weight: 57.7 kg     Height:       Physical Exam General: Well-appearing male; NAD Heart:Normal S1 and S2; No murmurs, gallops, or rubs Lungs: Clear throughout, no wheezing, rales, or rhonchi Abdomen:Soft, non-tender, (+) bowel sounds Extremities:No edema BLLE Dialysis Access: L AVF (+) Bruit/Thrill   Filed Weights   03/19/21 0818 03/22/21 0825 03/22/21 1230  Weight: 60.1 kg 57.7 kg 57.7 kg    Intake/Output Summary (Last 24 hours) at 03/23/2021 1253 Last data filed at 03/23/2021 0814 Gross per 24 hour  Intake 3 ml  Output --  Net 3 ml    Additional Objective Labs: Basic Metabolic Panel: Recent Labs  Lab 03/22/21 0452 03/22/21 0830 03/23/21 0206  NA 132* 131* 135  K 4.4 4.3 4.1  CL 91* 90* 96*  CO2 '28 28 28  '$ GLUCOSE 131* 122* 143*  BUN 53* 53* 24*  CREATININE 9.22* 9.41* 4.75*  CALCIUM 8.7* 8.8* 8.8*  PHOS 5.1* 4.9* 3.0   Liver Function Tests: Recent Labs  Lab 03/22/21 0452 03/22/21 0830 03/23/21 0206  ALBUMIN 2.3* 2.4* 2.4*   No results for input(s): LIPASE, AMYLASE in the last 168 hours. CBC: Recent Labs  Lab 03/20/21 0324 03/21/21 0450 03/22/21 0452 03/22/21 0830 03/23/21 0206  WBC 11.5* 14.2* 8.0 8.9 6.7  HGB 11.4* 10.9* 10.0* 9.9* 9.8*  HCT 36.7* 34.4* 32.4* 32.8* 32.8*  MCV 98.9 94.0 98.5 97.9 99.7  PLT 227 206 207 227 219   Blood Culture    Component Value Date/Time   SDES ABSCESS 03/19/2021 1709   SPECREQUEST LEFT PSOAS  03/19/2021 1709   CULT  03/19/2021 1709    FEW STAPHYLOCOCCUS AUREUS NO ANAEROBES ISOLATED; CULTURE IN PROGRESS FOR 5 DAYS    REPTSTATUS PENDING 03/19/2021 1709    Cardiac Enzymes: No results for input(s): CKTOTAL, CKMB, CKMBINDEX, TROPONINI in the last 168 hours. CBG: No results for input(s): GLUCAP in the last 168 hours. Iron Studies: No results for input(s): IRON, TIBC, TRANSFERRIN, FERRITIN in the last 72 hours. Lab Results  Component Value Date   INR 1.1 03/19/2021   Studies/Results: No results found.  Medications:   ceFAZolin (ANCEF) IV 1 g (03/22/21 2137)    Chlorhexidine Gluconate Cloth  6 each Topical Q0600   [START ON 03/26/2021] darbepoetin (ARANESP) injection - DIALYSIS  100 mcg Intravenous Q Mon-HD   [START ON 03/26/2021] doxercalciferol  3 mcg Intravenous Q M,W,F-HD   feeding supplement (NEPRO CARB STEADY)  237 mL Oral BID BM   gabapentin  100 mg Oral QHS   heparin  5,000 Units Subcutaneous Q8H   [START ON 03/26/2021] midodrine  10 mg Oral Q M,W,F-HD   multivitamin  1 tablet Oral QHS   polyethylene glycol  17 g Oral Daily   senna-docusate  2 tablet Oral QHS   sodium chloride flush  3 mL Intravenous Q12H   sucroferric oxyhydroxide  1,000 mg Oral  TID WC    [facility] Orders: [facility]  3h 53mn  180NRe 400/600  64kg   2/2 bath  P4  AVF   -Heparin  2200 units IV TIW -Mircera 150 mcg IV q 2 week (last dose 150 mcg IV 02/28/2021) -Venofer 100 mg IV X 10 doses (4/6 doses given. 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  Neurosurgery evaluated and no intervention deemed necessary.  Seen by  ID input, started cefazolin (as likely MSSA given h/o MSSA bacteremia recently).  IR did CT guided aspiration of 60 cc on 8/8, site to small for drain placement per IR.  Per ID, Continue cefazolin with HD, plan for 6 to 8 weeks course. Tentative end  date is 04/30/21. Pulmonary edema: Vascular congestion, small R pleural effusion on CXR but appears mild in comparison to CXR 01/2021. He has not been eating well, has most likely lost body wt. No acute dialysis needs today. No evidence of overt volume overload.   Elevated troponin: In setting of vascular congestion and ESRD. Troponin 84, 101 so far. Denies chest pain. Per primary L face contusion-supportive care per primary Mild hyperkalemia-K+5.8 03/21/21. No hemolysis. Lokelma 10 gram PO now and repeat in 8 hours. K+ levels now stablizing-now 4.1-monitor trend.   ESRD - Received HD 03/22/2021 due to staffing issues in HD unit-appears that he ran even. At present time he is not able to sit up in recliner without significant discomfort.  He may need rehab before he can be discharged to home for outpatient dialysis. Next HD here is planned for Monday 03/26/21-UF as tolerated-will continue to monitor his volume status and provide HD sooner if clinically indicated.  Hypertension/volume  - As noted above. BP well controlled. On midodrine 10 mg PO TID however notified by pharmacy that many doses have been held D/T high BP. Change to midodrine 10 mg PO TIW 30 prior to HD. May repeat mid HD if necessary. No evidence of overt volume overload by exam on admit. Now very much under EDW. Will need EDW reset on discharge. UF as tolerated.   Anemia  - HGB 9.8.  Missed OP ESA 08/03. Last ESA dose given 03/22/21. Hold Fe load in setting of infection.  Metabolic bone disease - Continue velphoro 500 mg 2 tabs PO TID AC. Continue VDRA. Labs at goal.   Nutrition - Renal carb mod diet, nepro, renal vits.  DM-No meds on OP list. Last HA1c 6.9. per primary Disposition - will likely need SNF given severe debilitation from back pain and inability to sit up/ambulate. Does he have resources for this care? Consult MSW.  CTobie Poet NP [facility] 03/23/2021,12:53 PM  LOS: 8 days

## 2021-03-23 NOTE — Progress Notes (Signed)
Physical Therapy Treatment Patient Details Name: Ryan Wells MRN: [id number] DOB: Dec 26, 1955 Today's Date: 03/23/2021    History of Present Illness Pt is 65 y.o. male presenting to ED on 8/3 with severe back/leg pain x2 weeks and diaphoresis.  Pt admitted with lumbar sacral osteomyelitis/discitis with septic arthritis L5-S1 and bil psoas abscess, R sided pleural effusion. . 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 At time of previous admission MRI (+) L5-S1 facet osteoarthritis, synovial cysts and periarticular soft tissue edema on the left. 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 Returned to ED 7/25 c/o radicular low back pain.  PMHx significant for ESRD on HD, HTN, DMII, chornic anemia and MSSA.    PT Comments    Pt with good improvement today.  He ambulated 20' with OT earlier, sat in chair for 1 hour, and then ambulated 6' with PT and participated with exercises.  Pt motivated and later observed performing HEP on his own.   He does still c/o increased pain and requiring premedication of pain meds (IV pain meds seemed to offer more relief for PT session compared to oral meds for OT session this morning per tech who was present for both). Pain relieved when returned to bed.  MD also present at start of session when pt c/o pain and reports will look into other options.  Continue to progress as able.    Follow Up Recommendations  CIR     Equipment Recommendations  Other (comment) (further assessment at next venue as pain improves)    Recommendations for Other Services Rehab consult     Precautions / Restrictions Precautions Precautions: Fall Precaution Comments: Back pain - back precautions for comfort    Mobility  Bed Mobility Overal bed mobility: Needs Assistance Bed Mobility: Sit to Sidelying;Rolling Rolling: Min guard Sidelying to sit: Mod assist;+2 for safety/equipment     Sit to sidelying: +2 for  physical assistance;Mod assist General bed mobility comments: Assist of 2 with sit to sidelying for pain control    Transfers Overall transfer level: Needs assistance Equipment used:  (Upright Rollator) Transfers: Sit to/from Stand Sit to Stand: Min assist;+2 physical assistance         General transfer comment: Increased time to rise, cues for hand placement, but min A x 2 to rise and then assist hand to platforms  Ambulation/Gait Ambulation/Gait assistance: Min assist;+2 physical assistance Gait Distance (Feet): 6 Feet (did ambulate 20' with OT earlier) Assistive device:  (upright rollator) Gait Pattern/deviations: Step-to pattern;Trunk flexed Gait velocity: decreased   General Gait Details: Heavy reliance on arms, assist for rollator and cues for rollator use, but pt was able to take steps from chair back to bed   Stairs             Wheelchair Mobility    Modified Rankin (Stroke Patients Only)       Balance Overall balance assessment: Needs assistance Sitting-balance support: Bilateral upper extremity supported;Feet supported Sitting balance-Leahy Scale: Fair Sitting balance - Comments: leans toward R to offload L hip; UE support for pain control   Standing balance support: Bilateral upper extremity supported Standing balance-Leahy Scale: Poor Standing balance comment: Requiring walker and assist of 2                            Cognition Arousal/Alertness: Awake/alert Behavior During Therapy: Anxious Overall Cognitive Status: No family/caregiver present to determine baseline cognitive functioning  General Comments: most likely baseline - some distraction due to pain      Exercises General Exercises - Lower Extremity Ankle Circles/Pumps: AROM;Both;10 reps;Supine Heel Slides: AROM;Both;10 reps;Supine Other Exercises Other Exercises: Began partial bridging position x 5 - pt completing isometric  but unable to lift buttock Other Exercises: core strengthening of obliques with reaching and crossing midline in supine x 10    General Comments General comments (skin integrity, edema, etc.): Educated on trying to get bed in chair position for meals or even up  to chair with nursing for meals.  Discussed HEP 3 x day (OT gave T band, AROM of LE)      Pertinent Vitals/Pain Pain Assessment: Faces Faces Pain Scale: Hurts whole lot (8/10 in chair , 6/10 standing, 2/10 back in bed) Pain Location: back and L buttocks; legs Pain Descriptors / Indicators: Discomfort;Grimacing;Guarding;Moaning (Pt moaning when in chair and with transfers but once back in supine reports "my whole body feels relaxed") Pain Intervention(s): Limited activity within patient's tolerance;Monitored during session;Premedicated before session;Relaxation    Home Living                      Prior Function            PT Goals (current goals can now be found in the care plan section) Acute Rehab PT Goals Patient Stated Goal: to be able to do for myself Progress towards PT goals: Progressing toward goals    Frequency    Min 4X/week      PT Plan Current plan remains appropriate    Co-evaluation              AM-PAC PT "6 Clicks" Mobility   Outcome Measure  Help needed turning from your back to your side while in a flat bed without using bedrails?: A Little Help needed moving from lying on your back to sitting on the side of a flat bed without using bedrails?: A Lot Help needed moving to and from a bed to a chair (including a wheelchair)?: A Lot Help needed standing up from a chair using your arms (e.g., wheelchair or bedside chair)?: A Lot Help needed to walk in hospital room?: A Lot Help needed climbing 3-5 steps with a railing? : Total 6 Click Score: 12    End of Session Equipment Utilized During Treatment: Gait belt Activity Tolerance: Patient tolerated treatment well Patient left: in  bed;with call bell/phone within reach;with bed alarm set Nurse Communication: Mobility status PT Visit Diagnosis: Difficulty in walking, not elsewhere classified (R26.2);Pain     Time: [id number] PT Time Calculation (min) (ACUTE ONLY): 28 min  Charges:  $Gait Training: 8-22 mins $Therapeutic Exercise: 8-22 mins                     Abran Richard, PT Acute Rehab Services Pager [phone] [facility] 03/23/2021, 12:46 PM

## 2021-03-23 NOTE — Plan of Care (Signed)

## 2021-03-23 NOTE — Progress Notes (Signed)
Pt was c/o not being able to have a bowel movement. He was given stool softer at hs x2 with no results. This a.m. pt was given a Dulcolax suppository. Writer was able to feel formed stool in the rectum. Pt also advised he made need to get oob to the toilet to allow for easier evacuation.

## 2021-03-24 DIAGNOSIS — M545 Low back pain, unspecified: Secondary | ICD-10-CM | POA: Diagnosis not present

## 2021-03-24 DIAGNOSIS — E119 Type 2 diabetes mellitus without complications: Secondary | ICD-10-CM | POA: Diagnosis not present

## 2021-03-24 DIAGNOSIS — N186 End stage renal disease: Secondary | ICD-10-CM | POA: Diagnosis not present

## 2021-03-24 DIAGNOSIS — R778 Other specified abnormalities of plasma proteins: Secondary | ICD-10-CM | POA: Diagnosis not present

## 2021-03-24 LAB — RENAL FUNCTION PANEL
Albumin: 2.4 g/dL — ABNORMAL LOW (ref 3.5–5.0)
Anion gap: 9 (ref 5–15)
BUN: 35 mg/dL — ABNORMAL HIGH (ref 8–23)
CO2: 32 mmol/L (ref 22–32)
Calcium: 9 mg/dL (ref 8.9–10.3)
Chloride: 94 mmol/L — ABNORMAL LOW (ref 98–111)
Creatinine, Ser: 6.6 mg/dL — ABNORMAL HIGH (ref 0.61–1.24)
GFR, Estimated: 9 mL/min — ABNORMAL LOW (ref >60)
Glucose, Bld: 146 mg/dL — ABNORMAL HIGH (ref 70–99)
Phosphorus: 3.3 mg/dL (ref 2.5–4.6)
Potassium: 4.4 mmol/L (ref 3.5–5.1)
Sodium: 135 mmol/L (ref 135–145)

## 2021-03-24 LAB — AEROBIC/ANAEROBIC CULTURE W GRAM STAIN (SURGICAL/DEEP WOUND)

## 2021-03-24 NOTE — Progress Notes (Signed)
Occupational Therapy Treatment Patient Details Name: Ryan Wells MRN: [id number] DOB: [birth year] Today's Date: 03/24/2021    History of present illness Pt is 65 y.o. male presenting to ED on 8/3 with severe back/leg pain x2 weeks and diaphoresis.  Pt admitted with lumbar sacral osteomyelitis/discitis with septic arthritis L5-S1 and bil psoas abscess, R sided pleural effusion. . 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 At time of previous admission MRI (+) L5-S1 facet osteoarthritis, synovial cysts and periarticular soft tissue edema on the left. 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 Returned to ED 7/25 c/o radicular low back pain.  PMHx significant for ESRD on HD, HTN, DMII, chornic anemia and MSSA.   OT comments  Pt. Seen for skilled OT treatment with utilization of tele interpreter services [phone] Shanon Brow).  Bed mobility with rolling to L mod a overall.  Unable to tolerate sitting eob secondary to severe c/o pain LLE.  Assisted into sidelying initially but pt. Tolerated rolling onto his back and reported that was more comfortable.  Will cont. To progress mobility and ADLS next session as pt. Able.    Follow Up Recommendations  Supervision/Assistance - 24 hour;CIR;Other (comment)    Equipment Recommendations  3 in 1 bedside commode    Recommendations for Other Services Rehab consult    Precautions / Restrictions Precautions Precautions: Fall Precaution Comments: Back pain - back precautions for comfort Restrictions Weight Bearing Restrictions: No       Mobility Bed Mobility Overal bed mobility: Needs Assistance Bed Mobility: Rolling;Sidelying to Sit;Sit to Sidelying Rolling: Min assist Sidelying to sit: Mod assist     Sit to sidelying: Min assist General bed mobility comments: rolled to L for bed mobility, able to transition into sitting eob but only tolerated approx. 1 min before stating he had to  lay back down    Transfers                 General transfer comment: unable to attempt today    Balance                                           ADL either performed or assessed with clinical judgement   ADL Overall ADL's : Needs assistance/impaired                                       General ADL Comments: significantly limited by pain-todays session focused on bed mobility only as that is all he was able to tolerate     Vision       Perception     Praxis      Cognition Arousal/Alertness: Awake/alert Behavior During Therapy: WFL for tasks assessed/performed Overall Cognitive Status: No family/caregiver present to determine baseline cognitive functioning                                          Exercises  Pt. States he has been given orange theraband and someone threw it away.  Requests new piece so he can cont. With HEP for B UES.  New piece of orange theraband provided.     Shoulder Instructions  General Comments      Pertinent Vitals/ Pain       Pain Assessment: Faces Faces Pain Scale: Hurts worst Pain Location: L leg, and also reports stomach pains when attempting BM Pain Descriptors / Indicators: Discomfort;Grimacing;Guarding;Moaning Pain Intervention(s): Limited activity within patient's tolerance;Monitored during session;Repositioned  Home Living                                          Prior Functioning/Environment              Frequency  Min 2X/week        Progress Toward Goals  OT Goals(current goals can now be found in the care plan section)  Progress towards OT goals: Progressing toward goals     Plan Discharge plan remains appropriate    Co-evaluation                 AM-PAC OT "6 Clicks" Daily Activity     Outcome Measure   Help from another person eating meals?: None Help from another person taking care of personal grooming?: A  Little Help from another person toileting, which includes using toliet, bedpan, or urinal?: A Lot Help from another person bathing (including washing, rinsing, drying)?: A Lot Help from another person to put on and taking off regular upper body clothing?: A Little Help from another person to put on and taking off regular lower body clothing?: A Lot 6 Click Score: 16    End of Session    OT Visit Diagnosis: Unsteadiness on feet (R26.81);Repeated falls (R29.6);Muscle weakness (generalized) (M62.81);Pain Pain - Right/Left: Left Pain - part of body: Leg   Activity Tolerance Patient limited by pain   Patient Left in bed;with call bell/phone within reach;with bed alarm set   Nurse Communication Other (comment) (pt. wanting to know if he will have HD today, also alerted rn tele box was beeping)        Time: [id number] OT Time Calculation (min): 15 min  Charges: OT General Charges $OT Visit: 1 Visit OT Treatments $Therapeutic Activity: 8-22 mins  Sonia Baller, COTA/L Acute Rehabilitation [phone]    Tanya Nones 03/24/2021, 1:08 PM

## 2021-03-24 NOTE — Progress Notes (Signed)
RN used interpretor with morning assessment.  Pt reporting no pain and no needs other than difficulty passing stool.  Pt verbalizes pain when trying to have bowel movement with no results. Bowel sounds active and stomach non-tender and not distended. RN will speak with MD during morning rounds. Daily laxative given to patient.

## 2021-03-24 NOTE — Progress Notes (Signed)
[facility] Progress Note   Subjective:    Patient seen and examined at bedside. Computer interpreter was utilized. He reports constipation-scheduled doses Miralax and Senna given. Otherwise doing well. Seen again exercising in bed. Denies SOB and CP. Plan for HD 03/26/21.  Objective Vitals:   03/22/21 2051 03/23/21 0747 03/23/21 2016 03/24/21 0500  BP: 125/64 128/65 132/65 (!) 154/69  Pulse: 69 68 72 71  Resp: '18 18 16 16  '$ Temp: 98.3 F (36.8 C)  99 F (37.2 C) 99 F (37.2 C)  TempSrc:   Oral Oral  SpO2: 98% 98% 98% 96%  Weight:      Height:       Physical Exam General: Well-appearing male; NAD Heart:Normal S1 and S2; No murmurs, gallops, or rubs Lungs: Clear throughout, no wheezing, rales, or rhonchi Abdomen:Soft, non-tender, (+) bowel sounds Extremities:No edema BLLE Dialysis Access: L AVF (+) Bruit/Thrill   Filed Weights   03/19/21 0818 03/22/21 0825 03/22/21 1230  Weight: 60.1 kg 57.7 kg 57.7 kg    Intake/Output Summary (Last 24 hours) at 03/24/2021 1345 Last data filed at 03/24/2021 0925 Gross per 24 hour  Intake 255 ml  Output 1 ml  Net 254 ml    Additional Objective Labs: Basic Metabolic Panel: Recent Labs  Lab 03/22/21 0830 03/23/21 0206 03/24/21 0218  NA 131* 135 135  K 4.3 4.1 4.4  CL 90* 96* 94*  CO2 28 28 32  GLUCOSE 122* 143* 146*  BUN 53* 24* 35*  CREATININE 9.41* 4.75* 6.60*  CALCIUM 8.8* 8.8* 9.0  PHOS 4.9* 3.0 3.3   Liver Function Tests: Recent Labs  Lab 03/22/21 0830 03/23/21 0206 03/24/21 0218  ALBUMIN 2.4* 2.4* 2.4*   No results for input(s): LIPASE, AMYLASE in the last 168 hours. CBC: Recent Labs  Lab 03/20/21 0324 03/21/21 0450 03/22/21 0452 03/22/21 0830 03/23/21 0206  WBC 11.5* 14.2* 8.0 8.9 6.7  HGB 11.4* 10.9* 10.0* 9.9* 9.8*  HCT 36.7* 34.4* 32.4* 32.8* 32.8*  MCV 98.9 94.0 98.5 97.9 99.7  PLT 227 206 207 227 219   Blood Culture    Component Value Date/Time   SDES ABSCESS 03/19/2021 1709    SPECREQUEST LEFT PSOAS 03/19/2021 1709   CULT  03/19/2021 1709    FEW STAPHYLOCOCCUS AUREUS NO ANAEROBES ISOLATED; CULTURE IN PROGRESS FOR 5 DAYS    REPTSTATUS PENDING 03/19/2021 1709    Cardiac Enzymes: No results for input(s): CKTOTAL, CKMB, CKMBINDEX, TROPONINI in the last 168 hours. CBG: No results for input(s): GLUCAP in the last 168 hours. Iron Studies: No results for input(s): IRON, TIBC, TRANSFERRIN, FERRITIN in the last 72 hours. Lab Results  Component Value Date   INR 1.1 03/19/2021   Studies/Results: No results found.  Medications:   ceFAZolin (ANCEF) IV 1 g (03/23/21 2234)    Chlorhexidine Gluconate Cloth  6 each Topical Q0600   [START ON 03/26/2021] darbepoetin (ARANESP) injection - DIALYSIS  100 mcg Intravenous Q Mon-HD   diclofenac  1 patch Transdermal BID   [START ON 03/26/2021] doxercalciferol  3 mcg Intravenous Q M,W,F-HD   feeding supplement (NEPRO CARB STEADY)  237 mL Oral BID BM   gabapentin  100 mg Oral QHS   heparin  5,000 Units Subcutaneous Q8H   lidocaine  1 patch Transdermal Q24H   [START ON 03/26/2021] midodrine  10 mg Oral Q M,W,F-HD   multivitamin  1 tablet Oral QHS   polyethylene glycol  17 g Oral Daily   senna-docusate  2 tablet Oral QHS  sodium chloride flush  3 mL Intravenous Q12H   sucroferric oxyhydroxide  1,000 mg Oral TID WC    Dialysis Orders: [facility]  3h 30mn  180NRe 400/600  64kg   2/2 bath  P4  AVF   -Heparin  2200 units IV TIW -Mircera 150 mcg IV q 2 week (last dose 150 mcg IV 02/28/2021) -Venofer 100 mg IV X 10 doses (4/6 doses given. 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  Neurosurgery evaluated and no intervention deemed necessary.  Seen by  ID input, started cefazolin (as likely MSSA given h/o MSSA bacteremia recently).  IR did CT guided aspiration of 60 cc on 8/8, site to small for drain  placement per IR.  Per ID, Continue cefazolin with HD, plan for 6 to 8 weeks course. Tentative end date is 04/30/21. Pulmonary edema: Vascular congestion, small R pleural effusion on CXR but appears mild in comparison to CXR 01/2021. No evidence of overt volume overload.   Elevated troponin: In setting of vascular congestion and ESRD. Troponin 84, 101 so far. Denies chest pain. Per primary L face contusion-supportive care per primary Mild hyperkalemia-K+5.8 03/21/21. No hemolysis. Lokelma 10 gram PO now and repeat in 8 hours. K+ levels now stablizing-now 4.4-monitor trend.   ESRD - Received HD 03/22/2021 due to staffing issues in HD unit-appears that he ran even. At present time he is not able to sit up in recliner without significant discomfort.  He may need rehab before he can be discharged to home for outpatient dialysis. Next HD here is planned for Monday 03/26/21-UF as tolerated-will continue to monitor his volume status and provide HD sooner if clinically indicated.  Hypertension/volume  - As noted above. BP well controlled. Continue midodrine 10 mg PO TIW 30 prior to HD. May repeat mid HD if necessary. No evidence of overt volume overload by exam on admit and currently. Now very much under EDW. Will need EDW reset on discharge. UF as tolerated.   Anemia  - HGB 9.8.  Missed OP ESA 08/03. Last ESA dose given 03/22/21. Hold Fe load in setting of infection.  Metabolic bone disease - Continue velphoro 500 mg 2 tabs PO TID AC. Continue VDRA. Labs at goal.   Nutrition - Renal carb mod diet, nepro, renal vits.  DM-No meds on OP list. Last HA1c 6.9. per primary Disposition - will likely need SNF given severe debilitation from back pain and inability to sit up/ambulate. Does he have resources for this care? Consult MSW.  CTobie Poet NP [facility] 03/24/2021,1:45 PM  LOS: 9 days

## 2021-03-24 NOTE — Progress Notes (Signed)
PROGRESS NOTE    Ryan Wells  [id number] DOB: Feb 05, 1956 DOA: 03/14/2021 PCP: Pcp, No    Brief Narrative:   Ryan Wells is a 65 y.o. male with medical history of end-stage renal disease on hemodialysis, essential hypertension, diabetes mellitus type 2, anemia of chronic kidney disease,  presented to the hospital with back and leg pain worsening for 2 weeks.  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  It appears during this hospitalization he had MRI imaging which noted bilateral L5-S1 facet osteoarthritis left greater than right with synovial cysts and periarticular soft tissue edema on the left which superimposed septic arthritis could not be excluded and anterior displacement of the cord with distortion at T6-T7.  He had completed 6 weeks of IV antibiotics with dialysis per ID notes from 8/2.  Patient was again seen in the emergency department on 7/25 for radicular pain and neurosurgery had recommended short course of steroids.  On this admission, in the emergency department patient was seen to be afebrile with stable vital signs.  CT imaging of the head and orbits negative for any acute intracranial pathology, but noted left facial and periorbital soft tissue contusion.  Labs from 8/3 significant for WBC 15.1, hemoglobin 10, potassium 4.4, BUN 63, creatinine 8.5, anion gap 20, and high-sensitivity troponin 84->101.  Chest x-ray significant for cardiomegaly with vascular congestion, focal area with pulmonary nodularity of the right mid lung correlates with cavitating lesion and a small right-sided pleural effusion.  Patient was then admitted to the hospital for further evaluation and treatment.   Principal Problem:   Lumbar back pain Active Problems:   Subclinical hypothyroidism   Anemia due to chronic kidney disease   Diabetes mellitus type II, non insulin  dependent (HCC)   ESRD on hemodialysis (HCC)   Pleural effusion   Elevated troponin   Leg pain   Leukocytosis   History of bacteremia  Lumbar/sacral osteomyelitis/discitis, L5-S1, Septic arthritis L5-S1, and on admission Bilateral psoas abscess s/p IR aspiration Patient presented with worsening lumbar back pain with radiation of pain..  Had recently completed 6 weeks course of antibiotic as per infectious disease.  History of MSSA bacteremia on 03/13/2021..  WBC was elevated at 15.1 on presentation.  MRI of the lumbosacral spine on 8/ 5/22 was notable for L5-S1 discitis with osteomyelitis with left psoas abscess.  IR drained 60 mL of purulent fluid from the left psoas region.  Neurosurgery and infectious disease followed the patient during hospitalization.  WBC has normalized.   Blood cultures from 03/15/2021 with no growth so far.  Fluid culture from aspiration with few Staph aureus sensitive to all antibiotics.  Continue pain management and IV cefazolin.  Plan to continue cefazolin with hemodialysis for 6 to 8 weeks.  Tentative end date of antibiotics 04/30/2021  Intractable back pain.  Limiting physical therapy.  On Dilaudid IV and oral oxycodone.  Changed oxycodone 1 to 2 tablets every 4 hourly on 03/23/2021.  Added Lidoderm and diclofenac patch on the back.  Still complains of radiating pain on the left side.  Left face contusion:  On CT scan.  No erythema.  Continue supportive care    Elevated troponin:  Mildly elevated.  EKG unremarkable.  Likely secondary to demand ischemia. No chest pain  Right-sided pleural effusion:  Continue hemodialysis for fluid management.  No respiratory distress.   ESRD on HD with hyperkalemia.:  On hemodialysis  as per nephrology.  Potassium of 4.4  Diabetes mellitus type 2:  Latest hemoglobin A1c of 5.1 on 01/16/2021.    History of MSSA bacteremia:  Has previously completed 6 weeks of antibiotic.  Had TEE with no evidence of endocarditis in the past.  On IV  cefazolin at this time.  Plan to continue for 6 to 8 weeks as per ID recommendation.   Anemia chronic kidney disease:  Baseline hemoglobin of around 8.0.  Latest hemoglobin of 9.8.  We will continue to monitor as outpatient   Subclinical hypothyroidism:  T4 normal.  TSH elevated.  Will need outpatient follow-up and repeat testing in 4 to 6 weeks.  Weakness/deconditioning/gait disturbance: Physical therapy on board and recommending CIR on discharge.  DVT prophylaxis: heparin injection 5,000 Units Start: 03/15/21 1400   CODE STATUS-full code  Family Communication:  None.  Disposition Plan:   Level of care: Med-Surg  Status is: Inpatient  Remains inpatient appropriate because:  IV treatments appropriate due to intensity of illness or inability to take PO, and Inpatient level of care appropriate due to severity of illness, awaiting for CIR  Dispo: The patient is from: Home              Anticipated d/c is to: CIR as per physical therapy              Patient currently is medically stable to d/c.   Difficult to place patient No   Consultants:  Neurosurgery Nephrology Infectious disease Interventional radiology  Procedures:  IR guided aspiration of the left psoas abscess on 03/19/2021 with removal of 60 mL purulent fluid.  Antimicrobials:  Ceftriaxone 8/4 - 8/6 Cefazolin 8/6>>  Subjective: Today, patient was seen and examined at bedside.  Complains of back pain with radiation.  Feels constipated.   Objective: Vitals:   03/22/21 2051 03/23/21 0747 03/23/21 2016 03/24/21 0500  BP: 125/64 128/65 132/65 (!) 154/69  Pulse: 69 68 72 71  Resp: '18 18 16 16  '$ Temp: 98.3 F (36.8 C)  99 F (37.2 C) 99 F (37.2 C)  TempSrc:   Oral Oral  SpO2: 98% 98% 98% 96%  Weight:      Height:        Intake/Output Summary (Last 24 hours) at 03/24/2021 1151 Last data filed at 03/24/2021 0925 Gross per 24 hour  Intake 255 ml  Output 1 ml  Net 254 ml     Filed Weights   03/19/21 0818  03/22/21 0825 03/22/21 1230  Weight: 60.1 kg 57.7 kg 57.7 kg    Physical examination: General:  Average built, mild distress due to pain, poor dentition, alert awake and communicative HENT:   No scleral pallor or icterus noted. Oral mucosa is moist.  Chest:  Clear breath sounds.   No crackles or wheezes.  CVS: S1 &S2 heard. No murmur.  Regular rate and rhythm. Abdomen: Soft, nontender, nondistended.  Bowel sounds are heard.  Tenderness on moving the extremities.  Extremities: No cyanosis, clubbing or edema.  Peripheral pulses are palpable. Psych: Alert awake and oriented, CNS:  No cranial nerve deficits.  Moving extremities with pain.. Skin: Warm and dry.  No rashes noted.   Data Reviewed: I have personally reviewed the following labs and imaging studies   CBC: Recent Labs  Lab 03/20/21 0324 03/21/21 0450 03/22/21 0452 03/22/21 0830 03/23/21 0206  WBC 11.5* 14.2* 8.0 8.9 6.7  HGB 11.4* 10.9* 10.0* 9.9* 9.8*  HCT 36.7* 34.4* 32.4* 32.8* 32.8*  MCV 98.9 94.0  98.5 97.9 99.7  PLT 227 206 207 227 [id number]    Basic Metabolic Panel: Recent Labs  Lab 03/21/21 0450 03/22/21 0452 03/22/21 0830 03/23/21 0206 03/24/21 0218  NA 130* 132* 131* 135 135  K 5.8* 4.4 4.3 4.1 4.4  CL 93* 91* 90* 96* 94*  CO2 '22 28 28 28 '$ 32  GLUCOSE 107* 131* 122* 143* 146*  BUN 40* 53* 53* 24* 35*  CREATININE 7.36* 9.22* 9.41* 4.75* 6.60*  CALCIUM 8.5* 8.7* 8.8* 8.8* 9.0  PHOS 5.0* 5.1* 4.9* 3.0 3.3    GFR: Estimated Creatinine Clearance: 8.5 mL/min (A) (by C-G formula based on SCr of 6.6 mg/dL (H)). Liver Function Tests: Recent Labs  Lab 03/21/21 0450 03/22/21 0452 03/22/21 0830 03/23/21 0206 03/24/21 0218  ALBUMIN 2.3* 2.3* 2.4* 2.4* 2.4*    No results for input(s): LIPASE, AMYLASE in the last 168 hours. No results for input(s): AMMONIA in the last 168 hours. Coagulation Profile: Recent Labs  Lab 03/19/21 0415  INR 1.1    Cardiac Enzymes: No results for input(s): CKTOTAL, CKMB,  CKMBINDEX, TROPONINI in the last 168 hours. BNP (last 3 results) No results for input(s): PROBNP in the last 8760 hours. HbA1C: No results for input(s): HGBA1C in the last 72 hours. CBG: No results for input(s): GLUCAP in the last 168 hours. Lipid Profile: No results for input(s): CHOL, HDL, LDLCALC, TRIG, CHOLHDL, LDLDIRECT in the last 72 hours. Thyroid Function Tests: No results for input(s): TSH, T4TOTAL, FREET4, T3FREE, THYROIDAB in the last 72 hours.  Anemia Panel: No results for input(s): VITAMINB12, FOLATE, FERRITIN, TIBC, IRON, RETICCTPCT in the last 72 hours. Sepsis Labs: No results for input(s): PROCALCITON, LATICACIDVEN in the last 168 hours.  Recent Results (from the past 240 hour(s))  Resp Panel by RT-PCR (Flu A&B, Covid) Nasopharyngeal Swab     Status: None   Collection Time: 03/15/21  2:08 AM   Specimen: Nasopharyngeal Swab; Nasopharyngeal(NP) swabs in vial transport medium  Result Value Ref Range Status   SARS Coronavirus 2 by RT PCR NEGATIVE NEGATIVE Final    Comment: (NOTE) SARS-CoV-2 target nucleic acids are NOT DETECTED.  The SARS-CoV-2 RNA is generally detectable in upper respiratory specimens during the acute phase of infection. The lowest concentration of SARS-CoV-2 viral copies this assay can detect is 138 copies/mL. A negative result does not preclude SARS-Cov-2 infection and should not be used as the sole basis for treatment or other patient management decisions. A negative result may occur with  improper specimen collection/handling, submission of specimen other than nasopharyngeal swab, presence of viral mutation(s) within the areas targeted by this assay, and inadequate number of viral copies(<138 copies/mL). A negative result must be combined with clinical observations, patient history, and epidemiological information. The expected result is Negative.  Fact Sheet for Patients:  EntrepreneurPulse.com.au  Fact Sheet for Healthcare  Providers:  IncredibleEmployment.be  This test is no t yet approved or cleared by the [place] FDA and  has been authorized for detection and/or diagnosis of SARS-CoV-2 by FDA under an Emergency Use Authorization (EUA). This EUA will remain  in effect (meaning this test can be used) for the duration of the COVID-19 declaration under Section 564(b)(1) of the Act, 21 U.S.C.section 360bbb-3(b)(1), unless the authorization is terminated  or revoked sooner.       Influenza A by PCR NEGATIVE NEGATIVE Final   Influenza B by PCR NEGATIVE NEGATIVE Final    Comment: (NOTE) The Xpert Xpress SARS-CoV-2/FLU/RSV plus assay is intended as an aid in  the diagnosis of influenza from Nasopharyngeal swab specimens and should not be used as a sole basis for treatment. Nasal washings and aspirates are unacceptable for Xpert Xpress SARS-CoV-2/FLU/RSV testing.  Fact Sheet for Patients: EntrepreneurPulse.com.au  Fact Sheet for Healthcare Providers: IncredibleEmployment.be  This test is not yet approved or cleared by the [place] FDA and has been authorized for detection and/or diagnosis of SARS-CoV-2 by FDA under an Emergency Use Authorization (EUA). This EUA will remain in effect (meaning this test can be used) for the duration of the COVID-19 declaration under Section 564(b)(1) of the Act, 21 U.S.C. section 360bbb-3(b)(1), unless the authorization is terminated or revoked.  Performed at [facility], [address] [place], [place] [address]   Culture, blood (routine x 2)     Status: None   Collection Time: 03/15/21  3:39 PM   Specimen: BLOOD  Result Value Ref Range Status   Specimen Description BLOOD RIGHT ANTECUBITAL  Final   Special Requests   Final    BOTTLES DRAWN AEROBIC AND ANAEROBIC Blood Culture results may not be optimal due to an inadequate volume of blood received in culture bottles   Culture    Final    NO GROWTH 5 DAYS Performed at [facility], [place] [address], [place], [place] [address]    Report Status 03/20/2021 FINAL  Final  Culture, blood (routine x 2)     Status: None   Collection Time: 03/15/21  3:47 PM   Specimen: BLOOD RIGHT HAND  Result Value Ref Range Status   Specimen Description BLOOD RIGHT HAND  Final   Special Requests   Final    BOTTLES DRAWN AEROBIC AND ANAEROBIC Blood Culture adequate volume   Culture   Final    NO GROWTH 5 DAYS Performed at [facility], [place] [address] [place], [place] [address]    Report Status 03/20/2021 FINAL  Final  Aerobic/Anaerobic Culture w Gram Stain (surgical/deep wound)     Status: None (Preliminary result)   Collection Time: 03/19/21  5:09 PM   Specimen: Abscess  Result Value Ref Range Status   Specimen Description ABSCESS  Final   Special Requests LEFT PSOAS  Final   Gram Stain   Final    ABUNDANT WBC PRESENT,BOTH PMN AND MONONUCLEAR FEW GRAM POSITIVE COCCI Performed at [facility], [place] [address] [place], [place] [address]    Culture   Final    FEW STAPHYLOCOCCUS AUREUS NO ANAEROBES ISOLATED; CULTURE IN PROGRESS FOR 5 DAYS    Report Status PENDING  Incomplete   Organism ID, Bacteria STAPHYLOCOCCUS AUREUS  Final      Susceptibility   Staphylococcus aureus - MIC*    CIPROFLOXACIN <=0.5 SENSITIVE Sensitive     ERYTHROMYCIN <=0.25 SENSITIVE Sensitive     GENTAMICIN <=0.5 SENSITIVE Sensitive     OXACILLIN 0.5 SENSITIVE Sensitive     TETRACYCLINE <=1 SENSITIVE Sensitive     VANCOMYCIN 1 SENSITIVE Sensitive     TRIMETH/SULFA <=10 SENSITIVE Sensitive     CLINDAMYCIN <=0.25 SENSITIVE Sensitive     RIFAMPIN <=0.5 SENSITIVE Sensitive     Inducible Clindamycin NEGATIVE Sensitive     * FEW STAPHYLOCOCCUS AUREUS      Radiology Studies: No results found.    Scheduled Meds:  Chlorhexidine Gluconate Cloth  6 each Topical Q0600   [START ON 03/26/2021] darbepoetin (ARANESP) injection - DIALYSIS  100  mcg Intravenous Q Mon-HD   diclofenac  1 patch Transdermal BID   [START  ON 03/26/2021] doxercalciferol  3 mcg Intravenous Q M,W,F-HD   feeding supplement (NEPRO CARB STEADY)  237 mL Oral BID BM   gabapentin  100 mg Oral QHS   heparin  5,000 Units Subcutaneous Q8H   lidocaine  1 patch Transdermal Q24H   [START ON 03/26/2021] midodrine  10 mg Oral Q M,W,F-HD   multivitamin  1 tablet Oral QHS   polyethylene glycol  17 g Oral Daily   senna-docusate  2 tablet Oral QHS   sodium chloride flush  3 mL Intravenous Q12H   sucroferric oxyhydroxide  1,000 mg Oral TID WC   Continuous Infusions:   ceFAZolin (ANCEF) IV 1 g (03/23/21 2234)     LOS: 9 days    Flora Lipps, MD [facility] 03/24/2021, 11:51 AM

## 2021-03-25 DIAGNOSIS — N186 End stage renal disease: Secondary | ICD-10-CM | POA: Diagnosis not present

## 2021-03-25 DIAGNOSIS — E119 Type 2 diabetes mellitus without complications: Secondary | ICD-10-CM | POA: Diagnosis not present

## 2021-03-25 DIAGNOSIS — M545 Low back pain, unspecified: Secondary | ICD-10-CM | POA: Diagnosis not present

## 2021-03-25 DIAGNOSIS — R778 Other specified abnormalities of plasma proteins: Secondary | ICD-10-CM | POA: Diagnosis not present

## 2021-03-25 LAB — CBC
HCT: 32.5 % — ABNORMAL LOW (ref 39.0–52.0)
Hemoglobin: 9.9 g/dL — ABNORMAL LOW (ref 13.0–17.0)
MCH: 29.8 pg (ref 26.0–34.0)
MCHC: 30.5 g/dL (ref 30.0–36.0)
MCV: 97.9 fL (ref 80.0–100.0)
Platelets: 224 10*3/uL (ref 150–400)
RBC: 3.32 MIL/uL — ABNORMAL LOW (ref 4.22–5.81)
RDW: 16.3 % — ABNORMAL HIGH (ref 11.5–15.5)
WBC: 6.9 10*3/uL (ref 4.0–10.5)
nRBC: 0 % (ref 0.0–0.2)

## 2021-03-25 LAB — RENAL FUNCTION PANEL
Albumin: 2.5 g/dL — ABNORMAL LOW (ref 3.5–5.0)
Anion gap: 12 (ref 5–15)
BUN: 46 mg/dL — ABNORMAL HIGH (ref 8–23)
CO2: 28 mmol/L (ref 22–32)
Calcium: 9.3 mg/dL (ref 8.9–10.3)
Chloride: 92 mmol/L — ABNORMAL LOW (ref 98–111)
Creatinine, Ser: 7.8 mg/dL — ABNORMAL HIGH (ref 0.61–1.24)
GFR, Estimated: 7 mL/min — ABNORMAL LOW (ref >60)
Glucose, Bld: 113 mg/dL — ABNORMAL HIGH (ref 70–99)
Phosphorus: 3.5 mg/dL (ref 2.5–4.6)
Potassium: 4.8 mmol/L (ref 3.5–5.1)
Sodium: 132 mmol/L — ABNORMAL LOW (ref 135–145)

## 2021-03-25 MED ORDER — PENTAFLUOROPROP-TETRAFLUOROETH EX AERO: 1.0000 "application " | INHALATION_SPRAY | CUTANEOUS | Status: DC | PRN

## 2021-03-25 MED ORDER — LIDOCAINE HCL (PF) 1 % IJ SOLN: 5.0000 mL | INTRAMUSCULAR | Status: DC | PRN

## 2021-03-25 MED ORDER — HEPARIN SODIUM (PORCINE) 1000 UNIT/ML DIALYSIS
1000.0000 [IU] | INTRAMUSCULAR | Status: DC | PRN
  Filled 2021-03-25: qty 1

## 2021-03-25 MED ORDER — POLYETHYLENE GLYCOL 3350 17 G PO PACK
17.0000 g | PACK | Freq: Two times a day (BID) | ORAL | Status: DC
  Administered 2021-03-25 – 2021-04-07 (×17): 17 g via ORAL
  Filled 2021-03-25 (×21): qty 1

## 2021-03-25 MED ORDER — SENNOSIDES-DOCUSATE SODIUM 8.6-50 MG PO TABS
2.0000 | ORAL_TABLET | Freq: Two times a day (BID) | ORAL | Status: DC
  Administered 2021-03-25 – 2021-04-07 (×22): 2 via ORAL
  Filled 2021-03-25 (×26): qty 2

## 2021-03-25 MED ORDER — ALTEPLASE 2 MG IJ SOLR
2.0000 mg | Freq: Once | INTRAMUSCULAR | Status: DC | PRN
  Filled 2021-03-25: qty 2

## 2021-03-25 MED ORDER — SODIUM CHLORIDE 0.9 % IV SOLN
100.0000 mL | INTRAVENOUS | Status: DC | PRN
Start: 2021-03-25 — End: 2021-03-26

## 2021-03-25 MED ORDER — SODIUM CHLORIDE 0.9 % IV SOLN: 100.0000 mL | INTRAVENOUS | Status: DC | PRN

## 2021-03-25 MED ORDER — LIDOCAINE-PRILOCAINE 2.5-2.5 % EX CREA
1.0000 "application " | TOPICAL_CREAM | CUTANEOUS | Status: DC | PRN
  Filled 2021-03-25: qty 5

## 2021-03-25 NOTE — Progress Notes (Signed)
PROGRESS NOTE    Ryan Wells  [id number] DOB: [birth year] DOA: 03/14/2021 PCP: Pcp, No    Brief Narrative:   Ryan Wells is a 65 y.o. male with medical history of end-stage renal disease on hemodialysis, essential hypertension, diabetes mellitus type 2, anemia of chronic kidney disease,  presented to the hospital with back and leg pain worsening for 2 weeks.  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  It appears during this hospitalization he had MRI imaging which noted bilateral L5-S1 facet osteoarthritis left greater than right with synovial cysts and periarticular soft tissue edema on the left which superimposed septic arthritis could not be excluded and anterior displacement of the cord with distortion at T6-T7.  He had completed 6 weeks of IV antibiotics with dialysis per ID notes from 8/2.  Patient was again seen in the emergency department on 7/25 for radicular pain and neurosurgery had recommended short course of steroids.  On this admission, in the emergency department patient was seen to be afebrile with stable vital signs.  CT imaging of the head and orbits negative for any acute intracranial pathology, but noted left facial and periorbital soft tissue contusion.  Labs from 8/3 significant for WBC 15.1, hemoglobin 10, potassium 4.4, BUN 63, creatinine 8.5, anion gap 20, and high-sensitivity troponin 84->101.  Chest x-ray significant for cardiomegaly with vascular congestion, focal area with pulmonary nodularity of the right mid lung correlates with cavitating lesion and a small right-sided pleural effusion.  Patient was then admitted to the hospital for further evaluation and treatment.   Principal Problem:   Lumbar back pain Active Problems:   Subclinical hypothyroidism   Anemia due to chronic kidney disease   Diabetes mellitus type II, non insulin  dependent (HCC)   ESRD on hemodialysis (HCC)   Pleural effusion   Elevated troponin   Leg pain   Leukocytosis   History of bacteremia  Lumbar/sacral osteomyelitis/discitis, L5-S1, Septic arthritis L5-S1, and on admission Bilateral psoas abscess s/p IR aspiration Patient presented with worsening lumbar back pain with radiation of pain..  Had recently completed 6 weeks course of antibiotic as per infectious disease.  History of MSSA bacteremia on 03/13/2021..  WBC was elevated at 15.1 on presentation.  MRI of the lumbosacral spine on 8/ 5/22 was notable for L5-S1 discitis with osteomyelitis with left psoas abscess.  IR drained 60 mL of purulent fluid from the left psoas region.  Neurosurgery and infectious disease followed the patient during hospitalization.  WBC has normalized.   Blood cultures from 03/15/2021 with no growth so far.  Fluid culture from aspiration with few Staph aureus sensitive to all antibiotics.  Continue pain management and IV cefazolin.  Plan to continue cefazolin with hemodialysis for 6 to 8 weeks.  Tentative end date of antibiotics 04/30/2021  Intractable back pain.  Improved today.  Initially limited to physical therapy.  L On Dilaudid IV and oral oxycodone.  Changed oxycodone 1 to 2 tablets every 4 hourly on 03/23/2021.  Added Lidoderm and diclofenac patch on the back.  Now complains of mild central back pain but much better than yesterday.  Left face contusion:  On CT scan.  No erythema.  Continue supportive care    Elevated troponin:  Mildly elevated.  EKG unremarkable.  Likely secondary to demand ischemia. No chest pain  Right-sided pleural effusion:  Continue hemodialysis for fluid management.  No respiratory distress.  ESRD on HD with hyperkalemia.:  On hemodialysis as per nephrology.  Potassium of 4.8  Diabetes mellitus type 2:  Latest hemoglobin A1c of 5.1 on 01/16/2021.  We will closely monitor   History of MSSA bacteremia:  Has previously completed 6 weeks of  antibiotic.  Had TEE with no evidence of endocarditis in the past.  On IV cefazolin at this time.  Plan to continue for 6 to 8 weeks as per ID recommendation.   Anemia chronic kidney disease:  Baseline hemoglobin of around 8.0.  Latest hemoglobin of 9.9.  We will continue to monitor as outpatient   Subclinical hypothyroidism:  T4 normal.  TSH elevated.  Will need outpatient follow-up and repeat testing in 4 to 6 weeks.  Weakness/deconditioning/gait disturbance: Physical therapy on board and recommending CIR on discharge.  DVT prophylaxis: heparin injection 5,000 Units Start: 03/15/21 1400   CODE STATUS-full code  Family Communication:  None.  Disposition Plan:   Level of care: Med-Surg  Status is: Inpatient  Remains inpatient appropriate because:  IV treatments appropriate due to intensity of illness or inability to take PO, and Inpatient level of care appropriate due to severity of illness, awaiting for CIR  Dispo: The patient is from: Home              Anticipated d/c is to: CIR as per physical therapy              Patient currently is medically stable to d/c.   Difficult to place patient No   Consultants:  Neurosurgery Nephrology Infectious disease Interventional radiology  Procedures:  IR guided aspiration of the left psoas abscess on 03/19/2021 with removal of 60 mL purulent fluid.  Antimicrobials:  Ceftriaxone 8/4 - 8/6 Cefazolin 8/6>>  Subjective: Today, patient was seen and examined at bedside.  Complains of mild back pain but much better than yesterday.  Still feels constipated.  Objective: Vitals:   03/24/21 0500 03/24/21 1447 03/24/21 2200 03/25/21 0426  BP: (!) 154/69 (!) 124/55 129/65 (!) 152/72  Pulse: 71 69 67 68  Resp: '16 16 18 16  '$ Temp: 99 F (37.2 C) 98.4 F (36.9 C) 98.4 F (36.9 C) 98.2 F (36.8 C)  TempSrc: Oral Oral Oral Oral  SpO2: 96% 98% 100% 97%  Weight:      Height:        Intake/Output Summary (Last 24 hours) at 03/25/2021  1129 Last data filed at 03/25/2021 0500 Gross per 24 hour  Intake --  Output 1 ml  Net -1 ml     Filed Weights   03/19/21 0818 03/22/21 0825 03/22/21 1230  Weight: 60.1 kg 57.7 kg 57.7 kg   Physical examination: General:  Average built, more alert awake and comfortable today.  Poor dentition, alert awake and communicative HENT:   No scleral pallor or icterus noted. Oral mucosa is moist.  Chest:  Clear breath sounds.   No crackles or wheezes.  CVS: S1 &S2 heard. No murmur.  Regular rate and rhythm. Abdomen: Soft, nontender, nondistended.  Bowel sounds are heard.  Tenderness on moving the extremities.   Extremities: No cyanosis, clubbing or edema.  Peripheral pulses are palpable.  Moves all extremities. Psych: Alert awake and oriented, in better mood today. CNS:  No cranial nerve deficits.  Moves all extremities. Skin: Warm and dry.  No rashes noted.   Data Reviewed: I have personally reviewed the following labs and imaging studies   CBC: Recent Labs  Lab 03/21/21 0450 03/22/21 0452 03/22/21 0830  03/23/21 0206 03/25/21 0306  WBC 14.2* 8.0 8.9 6.7 6.9  HGB 10.9* 10.0* 9.9* 9.8* 9.9*  HCT 34.4* 32.4* 32.8* 32.8* 32.5*  MCV 94.0 98.5 97.9 99.7 97.9  PLT 206 207 227 219 [id number]    Basic Metabolic Panel: Recent Labs  Lab 03/22/21 0452 03/22/21 0830 03/23/21 0206 03/24/21 0218 03/25/21 0306  NA 132* 131* 135 135 132*  K 4.4 4.3 4.1 4.4 4.8  CL 91* 90* 96* 94* 92*  CO2 '28 28 28 '$ 32 28  GLUCOSE 131* 122* 143* 146* 113*  BUN 53* 53* 24* 35* 46*  CREATININE 9.22* 9.41* 4.75* 6.60* 7.80*  CALCIUM 8.7* 8.8* 8.8* 9.0 9.3  PHOS 5.1* 4.9* 3.0 3.3 3.5    GFR: Estimated Creatinine Clearance: 7.2 mL/min (A) (by C-G formula based on SCr of 7.8 mg/dL (H)). Liver Function Tests: Recent Labs  Lab 03/22/21 0452 03/22/21 0830 03/23/21 0206 03/24/21 0218 03/25/21 0306  ALBUMIN 2.3* 2.4* 2.4* 2.4* 2.5*    No results for input(s): LIPASE, AMYLASE in the last 168 hours. No  results for input(s): AMMONIA in the last 168 hours. Coagulation Profile: Recent Labs  Lab 03/19/21 0415  INR 1.1    Cardiac Enzymes: No results for input(s): CKTOTAL, CKMB, CKMBINDEX, TROPONINI in the last 168 hours. BNP (last 3 results) No results for input(s): PROBNP in the last 8760 hours. HbA1C: No results for input(s): HGBA1C in the last 72 hours. CBG: No results for input(s): GLUCAP in the last 168 hours. Lipid Profile: No results for input(s): CHOL, HDL, LDLCALC, TRIG, CHOLHDL, LDLDIRECT in the last 72 hours. Thyroid Function Tests: No results for input(s): TSH, T4TOTAL, FREET4, T3FREE, THYROIDAB in the last 72 hours.  Anemia Panel: No results for input(s): VITAMINB12, FOLATE, FERRITIN, TIBC, IRON, RETICCTPCT in the last 72 hours. Sepsis Labs: No results for input(s): PROCALCITON, LATICACIDVEN in the last 168 hours.  Recent Results (from the past 240 hour(s))  Culture, blood (routine x 2)     Status: None   Collection Time: 03/15/21  3:39 PM   Specimen: BLOOD  Result Value Ref Range Status   Specimen Description BLOOD RIGHT ANTECUBITAL  Final   Special Requests   Final    BOTTLES DRAWN AEROBIC AND ANAEROBIC Blood Culture results may not be optimal due to an inadequate volume of blood received in culture bottles   Culture   Final    NO GROWTH 5 DAYS Performed at [facility], [place] [address], [place], [place] [address]    Report Status 03/20/2021 FINAL  Final  Culture, blood (routine x 2)     Status: None   Collection Time: 03/15/21  3:47 PM   Specimen: BLOOD RIGHT HAND  Result Value Ref Range Status   Specimen Description BLOOD RIGHT HAND  Final   Special Requests   Final    BOTTLES DRAWN AEROBIC AND ANAEROBIC Blood Culture adequate volume   Culture   Final    NO GROWTH 5 DAYS Performed at [facility], [place] [address], [place], [place] [address]    Report Status 03/20/2021 FINAL  Final  Aerobic/Anaerobic Culture w Gram Stain (surgical/deep wound)      Status: None   Collection Time: 03/19/21  5:09 PM   Specimen: Abscess  Result Value Ref Range Status   Specimen Description ABSCESS  Final   Special Requests LEFT PSOAS  Final   Gram Stain   Final    ABUNDANT WBC PRESENT,BOTH PMN AND MONONUCLEAR FEW GRAM POSITIVE COCCI  Culture   Final    FEW STAPHYLOCOCCUS AUREUS NO ANAEROBES ISOLATED Performed at [facility], [place] [address], [place], [place] [address]    Report Status 03/24/2021 FINAL  Final   Organism ID, Bacteria STAPHYLOCOCCUS AUREUS  Final      Susceptibility   Staphylococcus aureus - MIC*    CIPROFLOXACIN <=0.5 SENSITIVE Sensitive     ERYTHROMYCIN <=0.25 SENSITIVE Sensitive     GENTAMICIN <=0.5 SENSITIVE Sensitive     OXACILLIN 0.5 SENSITIVE Sensitive     TETRACYCLINE <=1 SENSITIVE Sensitive     VANCOMYCIN 1 SENSITIVE Sensitive     TRIMETH/SULFA <=10 SENSITIVE Sensitive     CLINDAMYCIN <=0.25 SENSITIVE Sensitive     RIFAMPIN <=0.5 SENSITIVE Sensitive     Inducible Clindamycin NEGATIVE Sensitive     * FEW STAPHYLOCOCCUS AUREUS      Radiology Studies: No results found.    Scheduled Meds:  Chlorhexidine Gluconate Cloth  6 each Topical Q0600   [START ON 03/26/2021] darbepoetin (ARANESP) injection - DIALYSIS  100 mcg Intravenous Q Mon-HD   diclofenac  1 patch Transdermal BID   [START ON 03/26/2021] doxercalciferol  3 mcg Intravenous Q M,W,F-HD   feeding supplement (NEPRO CARB STEADY)  237 mL Oral BID BM   gabapentin  100 mg Oral QHS   heparin  5,000 Units Subcutaneous Q8H   lidocaine  1 patch Transdermal Q24H   [START ON 03/26/2021] midodrine  10 mg Oral Q M,W,F-HD   multivitamin  1 tablet Oral QHS   polyethylene glycol  17 g Oral Daily   senna-docusate  2 tablet Oral QHS   sodium chloride flush  3 mL Intravenous Q12H   sucroferric oxyhydroxide  1,000 mg Oral TID WC   Continuous Infusions:   ceFAZolin (ANCEF) IV 1 g (03/24/21 2225)     LOS: 10 days    Flora Lipps, MD [facility] 03/25/2021, 11:29 AM

## 2021-03-25 NOTE — Progress Notes (Signed)
[facility] Progress Note   Subjective:    Patient seen and examined at bedside. Seen resting in bed. Plan for HD 03/26/21.  Objective Vitals:   03/24/21 0500 03/24/21 1447 03/24/21 2200 03/25/21 0426  BP: (!) 154/69 (!) 124/55 129/65 (!) 152/72  Pulse: 71 69 67 68  Resp: '16 16 18 16  '$ Temp: 99 F (37.2 C) 98.4 F (36.9 C) 98.4 F (36.9 C) 98.2 F (36.8 C)  TempSrc: Oral Oral Oral Oral  SpO2: 96% 98% 100% 97%  Weight:      Height:       Physical Exam General: Well-appearing male; NAD Heart:Normal S1 and S2; No murmurs, gallops, or rubs Lungs: Clear throughout, no wheezing, rales, or rhonchi Abdomen:Soft, non-tender, (+) bowel sounds Extremities:No edema BLLE Dialysis Access: L AVF (+) Bruit/Thrill   Filed Weights   03/19/21 0818 03/22/21 0825 03/22/21 1230  Weight: 60.1 kg 57.7 kg 57.7 kg    Intake/Output Summary (Last 24 hours) at 03/25/2021 1148 Last data filed at 03/25/2021 0500 Gross per 24 hour  Intake --  Output 1 ml  Net -1 ml    Additional Objective Labs: Basic Metabolic Panel: Recent Labs  Lab 03/23/21 0206 03/24/21 0218 03/25/21 0306  NA 135 135 132*  K 4.1 4.4 4.8  CL 96* 94* 92*  CO2 28 32 28  GLUCOSE 143* 146* 113*  BUN 24* 35* 46*  CREATININE 4.75* 6.60* 7.80*  CALCIUM 8.8* 9.0 9.3  PHOS 3.0 3.3 3.5   Liver Function Tests: Recent Labs  Lab 03/23/21 0206 03/24/21 0218 03/25/21 0306  ALBUMIN 2.4* 2.4* 2.5*   No results for input(s): LIPASE, AMYLASE in the last 168 hours. CBC: Recent Labs  Lab 03/21/21 0450 03/22/21 0452 03/22/21 0830 03/23/21 0206 03/25/21 0306  WBC 14.2* 8.0 8.9 6.7 6.9  HGB 10.9* 10.0* 9.9* 9.8* 9.9*  HCT 34.4* 32.4* 32.8* 32.8* 32.5*  MCV 94.0 98.5 97.9 99.7 97.9  PLT 206 207 227 219 224   Blood Culture    Component Value Date/Time   SDES ABSCESS 03/19/2021 1709   SPECREQUEST LEFT PSOAS 03/19/2021 1709   CULT  03/19/2021 1709    FEW STAPHYLOCOCCUS AUREUS NO ANAEROBES  ISOLATED Performed at [facility] [address], [place], [place] [address]    REPTSTATUS 03/24/2021 FINAL 03/19/2021 1709    Cardiac Enzymes: No results for input(s): CKTOTAL, CKMB, CKMBINDEX, TROPONINI in the last 168 hours. CBG: No results for input(s): GLUCAP in the last 168 hours. Iron Studies: No results for input(s): IRON, TIBC, TRANSFERRIN, FERRITIN in the last 72 hours. Lab Results  Component Value Date   INR 1.1 03/19/2021   Studies/Results: No results found.  Medications:   ceFAZolin (ANCEF) IV 1 g (03/24/21 2225)    Chlorhexidine Gluconate Cloth  6 each Topical Q0600   [START ON 03/26/2021] darbepoetin (ARANESP) injection - DIALYSIS  100 mcg Intravenous Q Mon-HD   diclofenac  1 patch Transdermal BID   [START ON 03/26/2021] doxercalciferol  3 mcg Intravenous Q M,W,F-HD   feeding supplement (NEPRO CARB STEADY)  237 mL Oral BID BM   gabapentin  100 mg Oral QHS   heparin  5,000 Units Subcutaneous Q8H   lidocaine  1 patch Transdermal Q24H   [START ON 03/26/2021] midodrine  10 mg Oral Q M,W,F-HD   multivitamin  1 tablet Oral QHS   polyethylene glycol  17 g Oral BID   senna-docusate  2 tablet Oral BID   sodium chloride flush  3 mL  Intravenous Q12H   sucroferric oxyhydroxide  1,000 mg Oral TID WC    Dialysis Orders: [facility]  3h 85mn  180NRe 400/600  64kg   2/2 bath  P4  AVF   -Heparin  2200 units IV TIW -Mircera 150 mcg IV q 2 week (last dose 150 mcg IV 02/28/2021) -Venofer 100 mg IV X 10 doses (4/6 doses given. 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  Neurosurgery evaluated and no intervention deemed necessary.  Seen by  ID input, started cefazolin (as likely MSSA given h/o MSSA bacteremia recently).  IR did CT guided aspiration of 60 cc on 8/8, site to small for drain placement per IR.  Per ID, Continue cefazolin with HD,  plan for 6 to 8 weeks course. Tentative end date is 04/30/21. Pulmonary edema: Vascular congestion, small R pleural effusion on CXR but appears mild in comparison to CXR 01/2021. No evidence of overt volume overload.   Elevated troponin: In setting of vascular congestion and ESRD. Troponin 84, 101 so far. Denies chest pain. Per primary L face contusion-supportive care per primary Mild hyperkalemia-K+5.8 03/21/21. No hemolysis. Lokelma 10 gram PO given. K+ levels now stablizing-now 4.8-monitor trend.  ESRD - Received HD 03/22/2021 due to staffing issues in HD unit-appears that he ran even. Previously, he is not able to sit up in recliner without significant discomfort. He has been exercising in his room lately-recommend trying to place patient in the recliner for next HD tx if possible. He may need rehab before he can be discharged to home for outpatient dialysis. Next HD here is planned for Monday 03/26/21-UF as tolerated. Hypertension/volume  - As noted above. BP well controlled. Continue midodrine 10 mg PO TIW 30 prior to HD. May repeat mid HD if necessary. No evidence of overt volume overload by exam on admit and currently. Now very much under EDW. Will need EDW reset on discharge. UF as tolerated.   Anemia  - HGB 9.9.  Missed OP ESA 08/03. Last ESA dose given 03/22/21. Hold Fe load in setting of infection.  Metabolic bone disease - Continue velphoro 500 mg 2 tabs PO TID AC. Continue VDRA. Labs at goal.  Nutrition - Renal carb mod diet, nepro, renal vits. DM-No meds on OP list. Last HA1c 6.9. per primary Disposition - will likely need SNF given severe debilitation from back pain and inability to sit up/ambulate. Does he have resources for this care? Consult MSW.  CTobie Poet NP [facility] 03/25/2021,11:48 AM  LOS: 10 days

## 2021-03-26 DIAGNOSIS — N186 End stage renal disease: Secondary | ICD-10-CM | POA: Diagnosis not present

## 2021-03-26 DIAGNOSIS — R778 Other specified abnormalities of plasma proteins: Secondary | ICD-10-CM | POA: Diagnosis not present

## 2021-03-26 DIAGNOSIS — E119 Type 2 diabetes mellitus without complications: Secondary | ICD-10-CM | POA: Diagnosis not present

## 2021-03-26 DIAGNOSIS — M545 Low back pain, unspecified: Secondary | ICD-10-CM | POA: Diagnosis not present

## 2021-03-26 LAB — RENAL FUNCTION PANEL
Albumin: 2.4 g/dL — ABNORMAL LOW (ref 3.5–5.0)
Anion gap: 12 (ref 5–15)
BUN: 63 mg/dL — ABNORMAL HIGH (ref 8–23)
CO2: 27 mmol/L (ref 22–32)
Calcium: 8.9 mg/dL (ref 8.9–10.3)
Chloride: 93 mmol/L — ABNORMAL LOW (ref 98–111)
Creatinine, Ser: 9.61 mg/dL — ABNORMAL HIGH (ref 0.61–1.24)
GFR, Estimated: 5 mL/min — ABNORMAL LOW (ref >60)
Glucose, Bld: 129 mg/dL — ABNORMAL HIGH (ref 70–99)
Phosphorus: 4.2 mg/dL (ref 2.5–4.6)
Potassium: 5.3 mmol/L — ABNORMAL HIGH (ref 3.5–5.1)
Sodium: 132 mmol/L — ABNORMAL LOW (ref 135–145)

## 2021-03-26 MED ORDER — DOXERCALCIFEROL 4 MCG/2ML IV SOLN
INTRAVENOUS | Status: AC
  Filled 2021-03-26: qty 2

## 2021-03-26 MED ORDER — MIDODRINE HCL 5 MG PO TABS
ORAL_TABLET | ORAL | Status: AC
  Administered 2021-03-26: 10 mg via ORAL
  Filled 2021-03-26: qty 2

## 2021-03-26 MED ORDER — DARBEPOETIN ALFA 100 MCG/0.5ML IJ SOSY
PREFILLED_SYRINGE | INTRAMUSCULAR | Status: AC
  Filled 2021-03-26: qty 0.5

## 2021-03-26 NOTE — Plan of Care (Signed)
  Problem: Activity: Goal: Risk for activity intolerance will decrease Outcome: Progressing   Problem: Nutrition: Goal: Adequate nutrition will be maintained Outcome: Progressing   Problem: Coping: Goal: Level of anxiety will decrease Outcome: Progressing   Problem: Pain Managment: Goal: General experience of comfort will improve Outcome: Progressing   Problem: Safety: Goal: Ability to remain free from injury will improve Outcome: Progressing   

## 2021-03-26 NOTE — Progress Notes (Signed)
PROGRESS NOTE    Ryan Wells  [id number] DOB: September 12, 1955 DOA: 03/14/2021 PCP: Pcp, No    Brief Narrative:   Ryan Wells is a 65 y.o. male with medical history of end-stage renal disease on hemodialysis, essential hypertension, diabetes mellitus type 2, anemia of chronic kidney disease,  presented to the hospital with back and leg pain worsening for 2 weeks.  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  It appears during this hospitalization he had MRI imaging which noted bilateral L5-S1 facet osteoarthritis left greater than right with synovial cysts and periarticular soft tissue edema on the left which superimposed septic arthritis could not be excluded and anterior displacement of the cord with distortion at T6-T7.  He had completed 6 weeks of IV antibiotics with dialysis per ID notes from 8/2.  Patient was again seen in the emergency department on 7/25 for radicular pain and neurosurgery had recommended short course of steroids.On this admission, in the emergency department patient was seen to be afebrile with stable vital signs.  CT imaging of the head and orbits negative for any acute intracranial pathology, but noted left facial and periorbital soft tissue contusion.  Labs from 8/3 significant for WBC 15.1, hemoglobin 10, potassium 4.4, BUN 63, creatinine 8.5, anion gap 20, and high-sensitivity troponin 84->101.  Chest x-ray significant for cardiomegaly with vascular congestion, focal area with pulmonary nodularity of the right mid lung correlates with cavitating lesion and a small right-sided pleural effusion.  Patient was then admitted to the hospital for further evaluation and treatment.  During hospitalization, patient has been optimized on  pain medication regimen.  Currently, awaiting for skilled nursing facility/CIR placement but being limited by ability to  sustain the course at hemodialysis unit on sitting.   Principal Problem:   Lumbar back pain Active Problems:   Subclinical hypothyroidism   Anemia due to chronic kidney disease   Diabetes mellitus type II, non insulin dependent (HCC)   ESRD on hemodialysis (HCC)   Pleural effusion   Elevated troponin   Leg pain   Leukocytosis   History of bacteremia  Lumbar/sacral osteomyelitis/discitis, L5-S1, Septic arthritis L5-S1, and on admission Bilateral psoas abscess s/p IR aspiration Patient presented with worsening lumbar back pain with radiation of pain..  Currently feels much better but was able to tolerate around 30 minutes of sitting down position today.  Had recently completed 6 weeks course of antibiotic as per infectious disease.  History of MSSA bacteremia on 03/13/2021.  WBC was elevated at 15.1 on presentation.  Hemoglobin of 6.9 today.   MRI of the lumbosacral spine on 8/ 5/22 was notable for L5-S1 discitis with osteomyelitis with left psoas abscess.  IR drained 60 mL of purulent fluid from the left psoas region.  Neurosurgery and infectious disease followed the patient during hospitalization.  WBC has normalized.   Blood cultures from 03/15/2021 with no growth so far.  Fluid culture from aspiration with  Staph aureus sensitive to all antibiotics.  Continue pain management and IV cefazolin.  Plan to continue cefazolin with hemodialysis for 6 to 8 weeks.  Tentative end date of antibiotics 04/30/2021  Intractable back pain.  Improved today.   On Dilaudid IV and oral oxycodone.  Improved pain control after oxycodone 1 to 2 tablets every 4 hourly, Lidoderm and diclofenac patch on the back.  Left face contusion:  On CT scan.  No erythema.  Continue supportive  care    Elevated troponin:  On presentation.  ACS rule out.  Right-sided pleural effusion:  On presentation.  Continue hemodialysis for fluid management.  No respiratory distress.   ESRD on HD with hyperkalemia.:  On hemodialysis as  per nephrology.  For hemodialysis.  Diabetes mellitus type 2:  Latest hemoglobin A1c of 5.1 on 01/16/2021.  Continue to monitor closely.   History of MSSA bacteremia:  Has previously completed 6 weeks of antibiotic.  Had TEE with no evidence of endocarditis in the past.  On IV cefazolin at this time.  Plan to continue for 6 to 8 weeks as per ID recommendation.   Anemia chronic kidney disease:  Baseline hemoglobin of around 8.0.  Latest hemoglobin of 9.9.  We will continue to monitor as outpatient   Subclinical hypothyroidism:  T4 normal.  TSH elevated.  Will need outpatient follow-up and repeat testing in 4 to 6 weeks.  Weakness/deconditioning/gait disturbance: Physical therapy on board and recommending CIR on discharge.  DVT prophylaxis: heparin injection 5,000 Units Start: 03/15/21 1400   CODE STATUS-full code  Family Communication:   None.  Disposition Plan:   Level of care: Med-Surg  Status is: Inpatient  Remains inpatient appropriate because:  IV treatments appropriate due to intensity of illness or inability to take PO, and Inpatient level of care appropriate due to severity of illness, awaiting for placement.  Dispo: The patient is from: Home              Anticipated d/c is to: Skilled nursing facility/CIR but unable to tolerate hemodialysis chair so far.              Patient currently is medically stable to d/c.   Difficult to place patient No   Consultants:  Neurosurgery Nephrology Infectious disease Interventional radiology  Procedures:  IR guided aspiration of the left psoas abscess on 03/19/2021 with removal of 60 mL purulent fluid.  Antimicrobials:  Ceftriaxone 8/4 - 8/6 Cefazolin 8/6>>  Subjective: Today, patient was seen and examined at bedside.  Complains of mild back pain but much improved.    Objective: Vitals:   03/25/21 2040 03/25/21 2118 03/26/21 0525 03/26/21 0849  BP: (!) 179/82 139/67 (!) 141/60 (!) 141/94  Pulse: 80 (!) 105 72 72  Resp:  '18 18 18 16  '$ Temp: 98.1 F (36.7 C) 97.9 F (36.6 C) 98.4 F (36.9 C) 98.3 F (36.8 C)  TempSrc: Oral Oral Oral Oral  SpO2: 96% 100% 100% 100%  Weight:      Height:        Intake/Output Summary (Last 24 hours) at 03/26/2021 1315 Last data filed at 03/26/2021 0514 Gross per 24 hour  Intake 150 ml  Output --  Net 150 ml     Filed Weights   03/19/21 0818 03/22/21 0825 03/22/21 1230  Weight: 60.1 kg 57.7 kg 57.7 kg   Physical examination: General:  Average built, not in obvious distress, alert awake and oriented, poor dentition HENT:   No scleral pallor or icterus noted. Oral mucosa is moist.  Chest:  Clear breath sounds.  Diminished breath sounds bilaterally. No crackles or wheezes.  CVS: S1 &S2 heard. No murmur.  Regular rate and rhythm. Abdomen: Soft, nontender, nondistended.  Bowel sounds are heard.   Extremities: No cyanosis, clubbing or edema.  Peripheral pulses are palpable. Psych: Alert, awake and oriented, normal mood CNS:  No cranial nerve deficits.  Moves all extremities. Skin: Warm and dry.  No rashes noted.  Data Reviewed: I  have personally reviewed the following labs and imaging studies   CBC: Recent Labs  Lab 03/21/21 0450 03/22/21 0452 03/22/21 0830 03/23/21 0206 03/25/21 0306  WBC 14.2* 8.0 8.9 6.7 6.9  HGB 10.9* 10.0* 9.9* 9.8* 9.9*  HCT 34.4* 32.4* 32.8* 32.8* 32.5*  MCV 94.0 98.5 97.9 99.7 97.9  PLT 206 207 227 219 [id number]    Basic Metabolic Panel: Recent Labs  Lab 03/22/21 0830 03/23/21 0206 03/24/21 0218 03/25/21 0306 03/26/21 0519  NA 131* 135 135 132* 132*  K 4.3 4.1 4.4 4.8 5.3*  CL 90* 96* 94* 92* 93*  CO2 28 28 32 28 27  GLUCOSE 122* 143* 146* 113* 129*  BUN 53* 24* 35* 46* 63*  CREATININE 9.41* 4.75* 6.60* 7.80* 9.61*  CALCIUM 8.8* 8.8* 9.0 9.3 8.9  PHOS 4.9* 3.0 3.3 3.5 4.2    GFR: Estimated Creatinine Clearance: 5.8 mL/min (A) (by C-G formula based on SCr of 9.61 mg/dL (H)). Liver Function Tests: Recent Labs  Lab  03/22/21 0830 03/23/21 0206 03/24/21 0218 03/25/21 0306 03/26/21 0519  ALBUMIN 2.4* 2.4* 2.4* 2.5* 2.4*    No results for input(s): LIPASE, AMYLASE in the last 168 hours. No results for input(s): AMMONIA in the last 168 hours. Coagulation Profile: No results for input(s): INR, PROTIME in the last 168 hours.  Cardiac Enzymes: No results for input(s): CKTOTAL, CKMB, CKMBINDEX, TROPONINI in the last 168 hours. BNP (last 3 results) No results for input(s): PROBNP in the last 8760 hours. HbA1C: No results for input(s): HGBA1C in the last 72 hours. CBG: No results for input(s): GLUCAP in the last 168 hours. Lipid Profile: No results for input(s): CHOL, HDL, LDLCALC, TRIG, CHOLHDL, LDLDIRECT in the last 72 hours. Thyroid Function Tests: No results for input(s): TSH, T4TOTAL, FREET4, T3FREE, THYROIDAB in the last 72 hours.  Anemia Panel: No results for input(s): VITAMINB12, FOLATE, FERRITIN, TIBC, IRON, RETICCTPCT in the last 72 hours. Sepsis Labs: No results for input(s): PROCALCITON, LATICACIDVEN in the last 168 hours.  Recent Results (from the past 240 hour(s))  Aerobic/Anaerobic Culture w Gram Stain (surgical/deep wound)     Status: None   Collection Time: 03/19/21  5:09 PM   Specimen: Abscess  Result Value Ref Range Status   Specimen Description ABSCESS  Final   Special Requests LEFT PSOAS  Final   Gram Stain   Final    ABUNDANT WBC PRESENT,BOTH PMN AND MONONUCLEAR FEW GRAM POSITIVE COCCI    Culture   Final    FEW STAPHYLOCOCCUS AUREUS NO ANAEROBES ISOLATED Performed at [facility], [address] [place], [place] [address]    Report Status 03/24/2021 FINAL  Final   Organism ID, Bacteria STAPHYLOCOCCUS AUREUS  Final      Susceptibility   Staphylococcus aureus - MIC*    CIPROFLOXACIN <=0.5 SENSITIVE Sensitive     ERYTHROMYCIN <=0.25 SENSITIVE Sensitive     GENTAMICIN <=0.5 SENSITIVE Sensitive     OXACILLIN 0.5 SENSITIVE Sensitive     TETRACYCLINE <=1  SENSITIVE Sensitive     VANCOMYCIN 1 SENSITIVE Sensitive     TRIMETH/SULFA <=10 SENSITIVE Sensitive     CLINDAMYCIN <=0.25 SENSITIVE Sensitive     RIFAMPIN <=0.5 SENSITIVE Sensitive     Inducible Clindamycin NEGATIVE Sensitive     * FEW STAPHYLOCOCCUS AUREUS      Radiology Studies: No results found.    Scheduled Meds:  Chlorhexidine Gluconate Cloth  6 each Topical Q0600   darbepoetin (ARANESP) injection - DIALYSIS  100 mcg Intravenous  Q Mon-HD   diclofenac  1 patch Transdermal BID   doxercalciferol  3 mcg Intravenous Q M,W,F-HD   feeding supplement (NEPRO CARB STEADY)  237 mL Oral BID BM   gabapentin  100 mg Oral QHS   heparin  5,000 Units Subcutaneous Q8H   lidocaine  1 patch Transdermal Q24H   midodrine  10 mg Oral Q M,W,F-HD   multivitamin  1 tablet Oral QHS   polyethylene glycol  17 g Oral BID   senna-docusate  2 tablet Oral BID   sodium chloride flush  3 mL Intravenous Q12H   sucroferric oxyhydroxide  1,000 mg Oral TID WC   Continuous Infusions:  sodium chloride     sodium chloride      ceFAZolin (ANCEF) IV 1 g (03/25/21 2157)     LOS: 11 days    Flora Lipps, MD [facility] 03/26/2021, 1:15 PM

## 2021-03-26 NOTE — Plan of Care (Signed)
  Problem: Activity: Goal: Risk for activity intolerance will decrease Outcome: Progressing   Problem: Nutrition: Goal: Adequate nutrition will be maintained Outcome: Progressing   Problem: Coping: Goal: Level of anxiety will decrease Outcome: Progressing   Problem: Elimination: Goal: Will not experience complications related to bowel motility Outcome: Progressing   

## 2021-03-26 NOTE — Progress Notes (Signed)
[facility] Progress Note   Subjective:     No issues overnight.  Feeling relatively well.  Plan for HD today.    Objective Vitals:   03/25/21 2040 03/25/21 2118 03/26/21 0525 03/26/21 0849  BP: (!) 179/82 139/67 (!) 141/60 (!) 141/94  Pulse: 80 (!) 105 72 72  Resp: '18 18 18 16  '$ Temp: 98.1 F (36.7 C) 97.9 F (36.6 C) 98.4 F (36.9 C) 98.3 F (36.8 C)  TempSrc: Oral Oral Oral Oral  SpO2: 96% 100% 100% 100%  Weight:      Height:       Physical Exam General: NAD Heart: RRR Lungs: clear bilaterally Abdomen:Soft, non-tender, (+) bowel sounds Extremities:No edema BLLE Dialysis Access: L forearm AVF (+) Bruit/Thrill   Filed Weights   03/19/21 0818 03/22/21 0825 03/22/21 1230  Weight: 60.1 kg 57.7 kg 57.7 kg    Intake/Output Summary (Last 24 hours) at 03/26/2021 1122 Last data filed at 03/26/2021 0514 Gross per 24 hour  Intake 150 ml  Output --  Net 150 ml    Additional Objective Labs: Basic Metabolic Panel: Recent Labs  Lab 03/24/21 0218 03/25/21 0306 03/26/21 0519  NA 135 132* 132*  K 4.4 4.8 5.3*  CL 94* 92* 93*  CO2 32 28 27  GLUCOSE 146* 113* 129*  BUN 35* 46* 63*  CREATININE 6.60* 7.80* 9.61*  CALCIUM 9.0 9.3 8.9  PHOS 3.3 3.5 4.2   Liver Function Tests: Recent Labs  Lab 03/24/21 0218 03/25/21 0306 03/26/21 0519  ALBUMIN 2.4* 2.5* 2.4*   No results for input(s): LIPASE, AMYLASE in the last 168 hours. CBC: Recent Labs  Lab 03/21/21 0450 03/22/21 0452 03/22/21 0830 03/23/21 0206 03/25/21 0306  WBC 14.2* 8.0 8.9 6.7 6.9  HGB 10.9* 10.0* 9.9* 9.8* 9.9*  HCT 34.4* 32.4* 32.8* 32.8* 32.5*  MCV 94.0 98.5 97.9 99.7 97.9  PLT 206 207 227 219 224   Blood Culture    Component Value Date/Time   SDES ABSCESS 03/19/2021 1709   SPECREQUEST LEFT PSOAS 03/19/2021 1709   CULT  03/19/2021 1709    FEW STAPHYLOCOCCUS AUREUS NO ANAEROBES ISOLATED Performed at [facility] [address] [place],  [address]     REPTSTATUS 03/24/2021 FINAL 03/19/2021 1709    Cardiac Enzymes: No results for input(s): CKTOTAL, CKMB, CKMBINDEX, TROPONINI in the last 168 hours. CBG: No results for input(s): GLUCAP in the last 168 hours. Iron Studies: No results for input(s): IRON, TIBC, TRANSFERRIN, FERRITIN in the last 72 hours. Lab Results  Component Value Date   INR 1.1 03/19/2021   Studies/Results: No results found.  Medications:  sodium chloride     sodium chloride      ceFAZolin (ANCEF) IV 1 g (03/25/21 2157)    Chlorhexidine Gluconate Cloth  6 each Topical Q0600   darbepoetin (ARANESP) injection - DIALYSIS  100 mcg Intravenous Q Mon-HD   diclofenac  1 patch Transdermal BID   doxercalciferol  3 mcg Intravenous Q M,W,F-HD   feeding supplement (NEPRO CARB STEADY)  237 mL Oral BID BM   gabapentin  100 mg Oral QHS   heparin  5,000 Units Subcutaneous Q8H   lidocaine  1 patch Transdermal Q24H   midodrine  10 mg Oral Q M,W,F-HD   multivitamin  1 tablet Oral QHS   polyethylene glycol  17 g Oral BID   senna-docusate  2 tablet Oral BID   sodium chloride flush  3 mL Intravenous Q12H   sucroferric oxyhydroxide  1,000  mg Oral TID WC    Dialysis Orders: [facility]  3h 22mn  180NRe 400/600  64kg   2/2 bath  P4  AVF   -Heparin  2200 units IV TIW -Mircera 150 mcg IV q 2 week (last dose 150 mcg IV 02/28/2021) -Venofer 100 mg IV X 10 doses (4/6 doses given. 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  Neurosurgery evaluated and no intervention deemed necessary.  Seen by  ID input, started cefazolin (as likely MSSA given h/o MSSA bacteremia recently).  IR did CT guided aspiration of 60 cc on 8/8, site to small for drain placement per IR.  Per ID, Continue cefazolin with HD, plan for 6 to 8 weeks course. Tentative end date is 04/30/21. ESRD - Received HD 03/22/2021 due to staffing  issues in HD unit-appears that he ran even. Previously, he is not able to sit up in recliner without significant discomfort. He has been exercising in his room lately-recommend trying to place patient in the recliner for next HD tx if possible. He may need rehab before he can be discharged to home for outpatient dialysis. Next HD here is planned for Monday 03/26/21-UF as tolerated. Hypertension/volume  - As noted above. BP well controlled. Continue midodrine 10 mg PO TIW 30 prior to HD. May repeat mid HD if necessary. No evidence of overt volume overload by exam on admit and currently. Now very much under EDW. Will need EDW reset on discharge. UF as tolerated.   Anemia  - HGB 9.9.  Missed OP ESA 08/03. Last ESA dose given 03/22/21. Hold Fe load in setting of infection.  Metabolic bone disease - Continue velphoro 500 mg 2 tabs PO TID AC. Continue VDRA. Labs at goal.  Nutrition - Renal carb mod diet, nepro, renal vits. DM-No meds on OP list. Last HA1c 6.9. per primary Disposition - will likely need SNF given severe debilitation from back pain and inability to sit up/ambulate.    EMadelon LipsMD CMid [facility] Pgr [phone]/15/2022,11:22 AM  LOS: 11 days

## 2021-03-26 NOTE — Progress Notes (Signed)
Patient to dialysis at this time.  Midodrine and Dilaudid given.

## 2021-03-26 NOTE — Progress Notes (Signed)
Inpatient Rehabilitation Admissions Coordinator   I continue to follow pt's progress with therapy. Is yet to be able to sit up in recliner due to pain. Not yet at a level to tolerate the intensity required of a CIR admit. Ability to tolerate sitting out of bed is a barrier to outpatient dialysis as well as Cir admit.   Danne Baxter, RN, MSN Rehab Admissions Coordinator [phone] 03/26/2021 12:51 PM

## 2021-03-26 NOTE — Progress Notes (Signed)
Physical Therapy Treatment Patient Details Name: Ryan Wells MRN: [id number] DOB: Apr 01, 1956 Today's Date: 03/26/2021    History of Present Illness Pt is 65 y.o. male presenting to ED on 8/3 with severe back/leg pain x2 weeks and diaphoresis.  Pt admitted with lumbar sacral osteomyelitis/discitis with septic arthritis L5-S1 and bil psoas abscess, R sided pleural effusion. . 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 At time of previous admission MRI (+) L5-S1 facet osteoarthritis, synovial cysts and periarticular soft tissue edema on the left. 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 Returned to ED 7/25 c/o radicular low back pain. 8/8 CT guided aspiration of left psoas abscess   PMHx significant for ESRD on HD, HTN, DMII, chornic anemia and MSSA.    PT Comments    Patient in severe pain but wanting to try to get OOB as he knows it is important. Once on his feet, bil UEs began shaking and could only take pivotal steps to chair. RN previously reported pt had pain medicine, but as she entered room she noted he could have oral pain meds at this time and administered. Discussed goal to sit up x 1 hour with pt and he agreed to try.     Follow Up Recommendations  CIR     Equipment Recommendations  Other (comment) (further assessment at next venue as pain improves)    Recommendations for Other Services Rehab consult     Precautions / Restrictions Precautions Precautions: Fall Precaution Comments: Back pain - back precautions for comfort    Mobility  Bed Mobility Overal bed mobility: Needs Assistance Bed Mobility: Rolling;Sidelying to Sit Rolling: Supervision (with rail) Sidelying to sit: Min assist (with rail)       General bed mobility comments: rolled to L for bed mobility, able to transition into sitting eob    Transfers Overall transfer level: Needs assistance Equipment used: Rolling walker (2  wheeled) Transfers: Sit to/from [facility] Sit to Stand: Min assist Stand pivot transfers: Min assist       General transfer comment: shaking with pain and unable to advance to farther ambulation  Ambulation/Gait             General Gait Details: unable to take more than pivotal steps due to pain   Stairs             Wheelchair Mobility    Modified Rankin (Stroke Patients Only)       Balance Overall balance assessment: Needs assistance Sitting-balance support: Bilateral upper extremity supported;Feet supported Sitting balance-Leahy Scale: Fair Sitting balance - Comments: leans toward R to offload L hip; UE support for pain control   Standing balance support: Bilateral upper extremity supported Standing balance-Leahy Scale: Poor Standing balance comment: Requiring walker and assist of 2                            Cognition Arousal/Alertness: Awake/alert Behavior During Therapy: WFL for tasks assessed/performed Overall Cognitive Status: Within Functional Limits for tasks assessed                                        Exercises      General Comments        Pertinent Vitals/Pain Pain Assessment: 0-10 Pain Score: 9  Pain Location: L leg, and back Pain Descriptors /  Indicators: Discomfort;Grimacing;Guarding;Moaning Pain Intervention(s): Limited activity within patient's tolerance;Repositioned;Patient requesting pain meds-RN notified;RN gave pain meds during session    Home Living                      Prior Function            PT Goals (current goals can now be found in the care plan section) Acute Rehab PT Goals Patient Stated Goal: to be able to do for myself Time For Goal Achievement: 03/31/21 Potential to Achieve Goals: Fair Progress towards PT goals: Progressing toward goals    Frequency    Min 4X/week      PT Plan Current plan remains appropriate    Co-evaluation               AM-PAC PT "6 Clicks" Mobility   Outcome Measure  Help needed turning from your back to your side while in a flat bed without using bedrails?: A Little Help needed moving from lying on your back to sitting on the side of a flat bed without using bedrails?: A Lot Help needed moving to and from a bed to a chair (including a wheelchair)?: A Lot Help needed standing up from a chair using your arms (e.g., wheelchair or bedside chair)?: A Lot Help needed to walk in hospital room?: A Lot Help needed climbing 3-5 steps with a railing? : Total 6 Click Score: 12    End of Session Equipment Utilized During Treatment: Gait belt Activity Tolerance: Patient limited by pain Patient left: with call bell/phone within reach;in chair;with chair alarm set Nurse Communication: Mobility status PT Visit Diagnosis: Difficulty in walking, not elsewhere classified (R26.2);Pain Pain - Right/Left:  (central back; bil LEs) Pain - part of body:  (back)     Time: 1039-1100 PT Time Calculation (min) (ACUTE ONLY): 21 min  Charges:  $Therapeutic Activity: 8-22 mins                      Arby Barrette, PT Pager [phone]    Rexanne Mano 03/26/2021, 11:10 AM

## 2021-03-27 DIAGNOSIS — R778 Other specified abnormalities of plasma proteins: Secondary | ICD-10-CM | POA: Diagnosis not present

## 2021-03-27 DIAGNOSIS — E119 Type 2 diabetes mellitus without complications: Secondary | ICD-10-CM | POA: Diagnosis not present

## 2021-03-27 DIAGNOSIS — M545 Low back pain, unspecified: Secondary | ICD-10-CM | POA: Diagnosis not present

## 2021-03-27 DIAGNOSIS — N186 End stage renal disease: Secondary | ICD-10-CM | POA: Diagnosis not present

## 2021-03-27 LAB — RENAL FUNCTION PANEL
Albumin: 2.6 g/dL — ABNORMAL LOW (ref 3.5–5.0)
Anion gap: 12 (ref 5–15)
BUN: 31 mg/dL — ABNORMAL HIGH (ref 8–23)
CO2: 27 mmol/L (ref 22–32)
Calcium: 8.7 mg/dL — ABNORMAL LOW (ref 8.9–10.3)
Chloride: 96 mmol/L — ABNORMAL LOW (ref 98–111)
Creatinine, Ser: 5.66 mg/dL — ABNORMAL HIGH (ref 0.61–1.24)
GFR, Estimated: 10 mL/min — ABNORMAL LOW (ref >60)
Glucose, Bld: 102 mg/dL — ABNORMAL HIGH (ref 70–99)
Phosphorus: 3.5 mg/dL (ref 2.5–4.6)
Potassium: 4.8 mmol/L (ref 3.5–5.1)
Sodium: 135 mmol/L (ref 135–145)

## 2021-03-27 LAB — CBC
HCT: 32.7 % — ABNORMAL LOW (ref 39.0–52.0)
Hemoglobin: 9.6 g/dL — ABNORMAL LOW (ref 13.0–17.0)
MCH: 29.1 pg (ref 26.0–34.0)
MCHC: 29.4 g/dL — ABNORMAL LOW (ref 30.0–36.0)
MCV: 99.1 fL (ref 80.0–100.0)
Platelets: 302 10*3/uL (ref 150–400)
RBC: 3.3 MIL/uL — ABNORMAL LOW (ref 4.22–5.81)
RDW: 16.4 % — ABNORMAL HIGH (ref 11.5–15.5)
WBC: 8.5 10*3/uL (ref 4.0–10.5)
nRBC: 0 % (ref 0.0–0.2)

## 2021-03-27 LAB — MAGNESIUM: Magnesium: 2.3 mg/dL (ref 1.7–2.4)

## 2021-03-27 MED ORDER — HYDROXYZINE HCL 10 MG PO TABS: 10.0000 mg | ORAL_TABLET | Freq: Three times a day (TID) | ORAL | Status: DC | PRN

## 2021-03-27 MED ORDER — GABAPENTIN 100 MG PO CAPS
100.0000 mg | ORAL_CAPSULE | Freq: Three times a day (TID) | ORAL | Status: DC
  Administered 2021-03-27 – 2021-04-07 (×34): 100 mg via ORAL
  Filled 2021-03-27 (×34): qty 1

## 2021-03-27 NOTE — Progress Notes (Signed)
Occupational therapy Treatment Note  Pt premedicated before session. [employer] Interpreter "Shanon Brow" used throughout session. Pt stating he was "too painful" to work with therapy however encouraged pt to participate. Rolls better toward R side for bed mobility. Tries to offload L hip by leaning right. Initially required Mod A to stand. Unable to stand fully upright. Once in standing ambulated @ 40 ft with min A using RW (attempted to use upright RW however pt would not release lower handles). Also attempted to sit in chair however pt crying/moaning in pain and states he was unable to sit and returned to bed.  Encourage staff to mobilize to [facility] for toileting, exiting bed on R side to increase mobility.  Discussed possible use of anxiety meds to help improve mobility. Continue to recommend post acute rehab. Pt very appreciative and praying for therapist.     03/27/21 1100  OT Visit Information  Last OT Received On 03/27/21  Assistance Needed +2  History of Present Illness Pt is 65 y.o. male presenting to ED on 8/3 with severe back/leg pain x2 weeks and diaphoresis.  Pt admitted with lumbar sacral osteomyelitis/discitis with septic arthritis L5-S1 and bil psoas abscess, R sided pleural effusion. . 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 At time of previous admission MRI (+) L5-S1 facet osteoarthritis, synovial cysts and periarticular soft tissue edema on the left. 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 Returned to ED 7/25 c/o radicular low back pain. 8/8 CT guided aspiration of left psoas abscess   PMHx significant for ESRD on HD, HTN, DMII, chornic anemia and MSSA.  Precautions  Precautions Fall  Precaution Comments Back pain - back precautions for comfort  Pain Assessment  Pain Assessment 0-10  Pain Score 10  Pain Location L leg, and back  Pain Descriptors / Indicators Discomfort;Grimacing;Guarding;Moaning  Pain Intervention(s)  Premedicated before session;Repositioned  Cognition  Arousal/Alertness Awake/alert  Behavior During Therapy Anxious  Overall Cognitive Status Within Functional Limits for tasks assessed  General Comments most likely baseline - some distraction due to pain  Difficult to assess due to Non-English speaking  Upper Extremity Assessment  Upper Extremity Assessment Generalized weakness  Lower Extremity Assessment  Lower Extremity Assessment Defer to PT evaluation  ADL  Overall ADL's  Needs assistance/impaired  Grooming Set up;Bed level  Upper Body Bathing Bed level;Set up  Lower Body Bathing Moderate assistance;Bed level  Functional mobility during ADLs Moderate assistance;+2 for physical assistance;Rolling walker;Cueing for safety;Cueing for sequencing  General ADL Comments encouraged pt to complete his own ADL tasks as much as possible at bed level using bed positional changes and figure four technqiue. Pt verblaized understanding  Bed Mobility  Overal bed mobility Needs Assistance  Bed Mobility Sidelying to Sit  Sidelying to sit Supervision  General bed mobility comments rolls toward R. Much less painful  Balance  Sitting balance-Leahy Scale Fair  Sitting balance - Comments  (limited by pain; pushing toward side to offload hip at times)  Standing balance-Leahy Scale Poor  Transfers  Overall transfer level Needs assistance  Equipment used Rolling walker (2 wheeled)  Transfers Sit to/from Stand  Sit to Stand Mod assist;+2 physical assistance  General transfer comment mod A to stand at times; flutuating depending on pain; Min A to walk  Other Exercises  Other Exercises continued to encourage particiaption in bed level exercises previously taught  OT - End of Session  Equipment Utilized During Treatment Gait belt;Rolling walker  Activity Tolerance Patient limited  by pain  Patient left in bed;with call bell/phone within reach;with nursing/sitter in room  Nurse Communication Other  (comment) (encourage mobility; discussed pain management and possible anxiety meds with MD)  OT Assessment/Plan  OT Plan Discharge plan remains appropriate  OT Visit Diagnosis Unsteadiness on feet (R26.81);Repeated falls (R29.6);Muscle weakness (generalized) (M62.81);Pain  Pain - Right/Left Left  Pain - part of body Leg  OT Frequency (ACUTE ONLY) Min 2X/week  Recommendations for Other Services Rehab consult  Follow Up Recommendations Supervision/Assistance - 24 hour;CIR;Other (comment)  OT Equipment 3 in 1 bedside commode  AM-PAC OT "6 Clicks" Daily Activity Outcome Measure (Version 2)  Help from another person eating meals? 4  Help from another person taking care of personal grooming? 3  Help from another person toileting, which includes using toliet, bedpan, or urinal? 2  Help from another person bathing (including washing, rinsing, drying)? 2  Help from another person to put on and taking off regular upper body clothing? 3  Help from another person to put on and taking off regular lower body clothing? 2  6 Click Score 16  Progressive Mobility  What is the highest level of mobility based on the progressive mobility assessment? Level 4 (Walks with assist in room) - Balance while marching in place and cannot step forward and back - Complete  Mobility Out of bed for toileting;Out of bed to chair with meals  OT Goal Progression  Progress towards OT goals Progressing toward goals  Acute Rehab OT Goals  Patient Stated Goal to be able to do for myself  OT Goal Formulation With patient  Time For Goal Achievement 03/31/21  Potential to Achieve Goals Good  ADL Goals  Pt Will Perform Upper Body Dressing with supervision;sitting  Pt Will Perform Lower Body Dressing with supervision;sit to/from stand;with adaptive equipment  Pt Will Transfer to Toilet with supervision;bedside commode  Pt Will Perform Toileting - Clothing Manipulation and hygiene with supervision;sit to/from stand  OT Time  Calculation  OT Start Time (ACUTE ONLY) 1035  OT Stop Time (ACUTE ONLY) 1108  OT Time Calculation (min) 33 min  OT General Charges  $OT Visit 1 Visit  OT Treatments  $Self Care/Home Management  23-37 mins  Maurie Boettcher, OT/L   Acute OT Clinical Specialist [place] Pager [phone] Office [phone]

## 2021-03-27 NOTE — Progress Notes (Signed)
PROGRESS NOTE    Ryan Wells  [id number] DOB: November 02, 1955 DOA: 03/14/2021 PCP: Pcp, No    Brief Narrative:   Ryan Wells is a 65 y.o. male with medical history of end-stage renal disease on hemodialysis, essential hypertension, diabetes mellitus type 2, anemia of chronic kidney disease,  presented to the hospital with back and leg pain worsening for 2 weeks.  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  It appears during this hospitalization he had MRI imaging which noted bilateral L5-S1 facet osteoarthritis left greater than right with synovial cysts and periarticular soft tissue edema on the left which superimposed septic arthritis could not be excluded and anterior displacement of the cord with distortion at T6-T7.  He had completed 6 weeks of IV antibiotics with dialysis per ID notes from 8/2.  Patient was again seen in the emergency department on 7/25 for radicular pain and neurosurgery had recommended short course of steroids.On this admission, in the emergency department patient was seen to be afebrile with stable vital signs.  CT imaging of the head and orbits negative for any acute intracranial pathology, but noted left facial and periorbital soft tissue contusion.  Labs from 8/3 significant for WBC 15.1, hemoglobin 10, potassium 4.4, BUN 63, creatinine 8.5, anion gap 20, and high-sensitivity troponin 84->101.  Chest x-ray significant for cardiomegaly with vascular congestion, focal area with pulmonary nodularity of the right mid lung correlates with cavitating lesion and a small right-sided pleural effusion.  Patient was then admitted to the hospital for further evaluation and treatment.  During hospitalization, patient has been adjusted on  pain medication regimen.  Currently, awaiting for skilled nursing facility/CIR placement but being limited by ability to  sustain the course at hemodialysis unit on sitting.   Principal Problem:   Lumbar back pain Active Problems:   Subclinical hypothyroidism   Anemia due to chronic kidney disease   Diabetes mellitus type II, non insulin dependent (HCC)   ESRD on hemodialysis (HCC)   Pleural effusion   Elevated troponin   Leg pain   Leukocytosis   History of bacteremia  Lumbar/sacral osteomyelitis/discitis, L5-S1, Septic arthritis L5-S1, and on admission Bilateral psoas abscess s/p IR aspiration Patient presented with worsening lumbar back pain with radiation of pain.  He does complain of persistent pain especially while sitting down.  Had recently completed 6 weeks course of antibiotic as per infectious disease.  History of MSSA bacteremia on 03/13/2021.  WBC was elevated at 15.1 on presentation.  Latest hemoglobin of 9.6  MRI of the lumbosacral spine on 8/ 5/22 was notable for L5-S1 discitis with osteomyelitis with left psoas abscess.  IR drained 60 mL of purulent fluid from the left psoas region.  Neurosurgery and infectious disease followed the patient during hospitalization.  WBC has normalized.  Recent blood cultures negative.  Fluid culture from aspiration with  Staph aureus sensitive to all antibiotics.  Continue pain management and IV cefazolin.  Plan to continue cefazolin with hemodialysis for 6 to 8 weeks.  Tentative end date of antibiotics 04/30/2021  Intractable back pain.  Still complains of pain while doing physical therapy and sitting down.  On Dilaudid IV and oral oxycodone.  Mildly improved pain control after oxycodone 1 to 2 tablets every 4 hourly, Lidoderm and diclofenac patch on the back.  Increase Neurontin dosage to 100 mg 3 times daily.  Left face contusion:  On CT scan.  No erythema.  Continue supportive care    Elevated troponin:  On presentation.  ACS ruled out.  Right-sided pleural effusion:  On presentation.  Continue hemodialysis for fluid management.  No respiratory distress.    ESRD on HD with hyperkalemia.:  On hemodialysis as per nephrology.  For hemodialysis.  Diabetes mellitus type 2:  Latest hemoglobin A1c of 5.1 on 01/16/2021.  Continue to monitor closely.   History of MSSA bacteremia:  Has previously completed 6 weeks of antibiotic.  Had TEE with no evidence of endocarditis in the past.  On IV cefazolin at this time.  Plan to continue for 6 to 8 weeks as per ID recommendation.   Anemia chronic kidney disease:  Baseline hemoglobin of around 8.0.  Latest hemoglobin of 9.9.  We will continue to monitor as outpatient   Subclinical hypothyroidism:  T4 normal.  TSH elevated.  Will need outpatient follow-up and repeat testing in 4 to 6 weeks.  Weakness/deconditioning/gait disturbance: Physical therapy on board and recommending CIR on discharge.  Currently awaiting for disposition plan.  DVT prophylaxis: heparin injection 5,000 Units Start: 03/15/21 1400   CODE STATUS-full code  Family Communication:   None.  Disposition Plan:   Level of care: Med-Surg  Status is: Inpatient  Remains inpatient appropriate because:  IV treatments appropriate due to intensity of illness or inability to take PO, and Inpatient level of care appropriate due to severity of illness, awaiting for placement.  Dispo: The patient is from: Home              Anticipated d/c is to: Skilled nursing facility/CIR but unable to tolerate hemodialysis chair so far.              Patient currently is medically stable to d/c.   Difficult to place patient No  Consultants:  Neurosurgery Nephrology Infectious disease Interventional radiology  Procedures:  IR guided aspiration of the left psoas abscess on 03/19/2021 with removal of 60 mL purulent fluid.  Antimicrobials:  Ceftriaxone 8/4 - 8/6 Cefazolin 8/6>>  Subjective:  Today, patient was seen and examined at bedside.  Complains of pain while trying to do therapy but does do stretching his legs with elastic  band.  Objective: Vitals:   03/26/21 1540 03/26/21 1956 03/26/21 2137 03/27/21 0434  BP: 127/67 132/68 119/64 (!) 144/60  Pulse: 75 77  70  Resp: '10 15  18  '$ Temp: 98.4 F (36.9 C) 98.5 F (36.9 C)  98.9 F (37.2 C)  TempSrc: Oral Oral  Oral  SpO2: 99% 99%  100%  Weight: 62 kg     Height:        Intake/Output Summary (Last 24 hours) at 03/27/2021 1255 Last data filed at 03/26/2021 1528 Gross per 24 hour  Intake --  Output 1503 ml  Net -1503 ml     Filed Weights   03/22/21 1230 03/26/21 1217 03/26/21 1540  Weight: 57.7 kg 63.2 kg 62 kg   Physical examination:  General:  Average built, not in obvious distress, poor dentition, alert awake and oriented HENT:   No scleral pallor or icterus noted. Oral mucosa is moist.  Chest:  Clear breath sounds.  Diminished breath sounds bilaterally. No crackles or wheezes.  CVS: S1 &S2 heard. No murmur.  Regular rate and rhythm. Abdomen: Soft, nontender, nondistended.  Bowel sounds are heard.   Extremities: No cyanosis, clubbing or edema.  Peripheral pulses are palpable. Psych: Alert, awake and oriented, normal mood CNS:  No cranial nerve deficits.  Moves  all extremities. Skin: Warm and dry.  No rashes noted.  Data Reviewed: I have personally reviewed the following labs and imaging studies   CBC: Recent Labs  Lab 03/22/21 0452 03/22/21 0830 03/23/21 0206 03/25/21 0306 03/27/21 0517  WBC 8.0 8.9 6.7 6.9 8.5  HGB 10.0* 9.9* 9.8* 9.9* 9.6*  HCT 32.4* 32.8* 32.8* 32.5* 32.7*  MCV 98.5 97.9 99.7 97.9 99.1  PLT 207 227 219 224 [id number]    Basic Metabolic Panel: Recent Labs  Lab 03/23/21 0206 03/24/21 0218 03/25/21 0306 03/26/21 0519 03/27/21 0517  NA 135 135 132* 132* 135  K 4.1 4.4 4.8 5.3* 4.8  CL 96* 94* 92* 93* 96*  CO2 28 32 '28 27 27  '$ GLUCOSE 143* 146* 113* 129* 102*  BUN 24* 35* 46* 63* 31*  CREATININE 4.75* 6.60* 7.80* 9.61* 5.66*  CALCIUM 8.8* 9.0 9.3 8.9 8.7*  MG  --   --   --   --  2.3  PHOS 3.0 3.3 3.5 4.2 3.5     GFR: Estimated Creatinine Clearance: 9.9 mL/min (A) (by C-G formula based on SCr of 5.66 mg/dL (H)). Liver Function Tests: Recent Labs  Lab 03/23/21 0206 03/24/21 0218 03/25/21 0306 03/26/21 0519 03/27/21 0517  ALBUMIN 2.4* 2.4* 2.5* 2.4* 2.6*    No results for input(s): LIPASE, AMYLASE in the last 168 hours. No results for input(s): AMMONIA in the last 168 hours. Coagulation Profile: No results for input(s): INR, PROTIME in the last 168 hours.  Cardiac Enzymes: No results for input(s): CKTOTAL, CKMB, CKMBINDEX, TROPONINI in the last 168 hours. BNP (last 3 results) No results for input(s): PROBNP in the last 8760 hours. HbA1C: No results for input(s): HGBA1C in the last 72 hours. CBG: No results for input(s): GLUCAP in the last 168 hours. Lipid Profile: No results for input(s): CHOL, HDL, LDLCALC, TRIG, CHOLHDL, LDLDIRECT in the last 72 hours. Thyroid Function Tests: No results for input(s): TSH, T4TOTAL, FREET4, T3FREE, THYROIDAB in the last 72 hours.  Anemia Panel: No results for input(s): VITAMINB12, FOLATE, FERRITIN, TIBC, IRON, RETICCTPCT in the last 72 hours. Sepsis Labs: No results for input(s): PROCALCITON, LATICACIDVEN in the last 168 hours.  Recent Results (from the past 240 hour(s))  Aerobic/Anaerobic Culture w Gram Stain (surgical/deep wound)     Status: None   Collection Time: 03/19/21  5:09 PM   Specimen: Abscess  Result Value Ref Range Status   Specimen Description ABSCESS  Final   Special Requests LEFT PSOAS  Final   Gram Stain   Final    ABUNDANT WBC PRESENT,BOTH PMN AND MONONUCLEAR FEW GRAM POSITIVE COCCI    Culture   Final    FEW STAPHYLOCOCCUS AUREUS NO ANAEROBES ISOLATED Performed at [facility], [address] [place], [place] [address]    Report Status 03/24/2021 FINAL  Final   Organism ID, Bacteria STAPHYLOCOCCUS AUREUS  Final      Susceptibility   Staphylococcus aureus - MIC*    CIPROFLOXACIN <=0.5 SENSITIVE Sensitive      ERYTHROMYCIN <=0.25 SENSITIVE Sensitive     GENTAMICIN <=0.5 SENSITIVE Sensitive     OXACILLIN 0.5 SENSITIVE Sensitive     TETRACYCLINE <=1 SENSITIVE Sensitive     VANCOMYCIN 1 SENSITIVE Sensitive     TRIMETH/SULFA <=10 SENSITIVE Sensitive     CLINDAMYCIN <=0.25 SENSITIVE Sensitive     RIFAMPIN <=0.5 SENSITIVE Sensitive     Inducible Clindamycin NEGATIVE Sensitive     * FEW STAPHYLOCOCCUS AUREUS      Radiology  Studies: No results found.    Scheduled Meds:  Chlorhexidine Gluconate Cloth  6 each Topical Q0600   darbepoetin (ARANESP) injection - DIALYSIS  100 mcg Intravenous Q Mon-HD   diclofenac  1 patch Transdermal BID   doxercalciferol  3 mcg Intravenous Q M,W,F-HD   feeding supplement (NEPRO CARB STEADY)  237 mL Oral BID BM   gabapentin  100 mg Oral QHS   heparin  5,000 Units Subcutaneous Q8H   lidocaine  1 patch Transdermal Q24H   midodrine  10 mg Oral Q M,W,F-HD   multivitamin  1 tablet Oral QHS   polyethylene glycol  17 g Oral BID   senna-docusate  2 tablet Oral BID   sodium chloride flush  3 mL Intravenous Q12H   sucroferric oxyhydroxide  1,000 mg Oral TID WC   Continuous Infusions:   ceFAZolin (ANCEF) IV 1 g (03/26/21 2139)     LOS: 12 days    Flora Lipps, MD [facility] 03/27/2021, 12:55 PM

## 2021-03-27 NOTE — Progress Notes (Signed)
[facility] Progress Note   Subjective:     HD yesterday, no issues.  Pt says taht his back pain is too bad to sit in the chair for HD yet/    Objective Vitals:   03/26/21 1540 03/26/21 1956 03/26/21 2137 03/27/21 0434  BP: 127/67 132/68 119/64 (!) 144/60  Pulse: 75 77  70  Resp: '10 15  18  '$ Temp: 98.4 F (36.9 C) 98.5 F (36.9 C)  98.9 F (37.2 C)  TempSrc: Oral Oral  Oral  SpO2: 99% 99%  100%  Weight: 62 kg     Height:       Physical Exam General: NAD Heart: RRR Lungs: clear bilaterally Abdomen:Soft, non-tender, (+) bowel sounds Extremities:No edema BLLE Dialysis Access: L forearm AVF (+) Bruit/Thrill   Filed Weights   03/22/21 1230 03/26/21 1217 03/26/21 1540  Weight: 57.7 kg 63.2 kg 62 kg    Intake/Output Summary (Last 24 hours) at 03/27/2021 1133 Last data filed at 03/26/2021 1528 Gross per 24 hour  Intake --  Output 1503 ml  Net -1503 ml    Additional Objective Labs: Basic Metabolic Panel: Recent Labs  Lab 03/25/21 0306 03/26/21 0519 03/27/21 0517  NA 132* 132* 135  K 4.8 5.3* 4.8  CL 92* 93* 96*  CO2 '28 27 27  '$ GLUCOSE 113* 129* 102*  BUN 46* 63* 31*  CREATININE 7.80* 9.61* 5.66*  CALCIUM 9.3 8.9 8.7*  PHOS 3.5 4.2 3.5   Liver Function Tests: Recent Labs  Lab 03/25/21 0306 03/26/21 0519 03/27/21 0517  ALBUMIN 2.5* 2.4* 2.6*   No results for input(s): LIPASE, AMYLASE in the last 168 hours. CBC: Recent Labs  Lab 03/22/21 0452 03/22/21 0830 03/23/21 0206 03/25/21 0306 03/27/21 0517  WBC 8.0 8.9 6.7 6.9 8.5  HGB 10.0* 9.9* 9.8* 9.9* 9.6*  HCT 32.4* 32.8* 32.8* 32.5* 32.7*  MCV 98.5 97.9 99.7 97.9 99.1  PLT 207 227 219 224 302   Blood Culture    Component Value Date/Time   SDES ABSCESS 03/19/2021 1709   SPECREQUEST LEFT PSOAS 03/19/2021 1709   CULT  03/19/2021 1709    FEW STAPHYLOCOCCUS AUREUS NO ANAEROBES ISOLATED Performed at [facility] [address] [place], [place] [address]    REPTSTATUS  03/24/2021 FINAL 03/19/2021 1709    Cardiac Enzymes: No results for input(s): CKTOTAL, CKMB, CKMBINDEX, TROPONINI in the last 168 hours. CBG: No results for input(s): GLUCAP in the last 168 hours. Iron Studies: No results for input(s): IRON, TIBC, TRANSFERRIN, FERRITIN in the last 72 hours. Lab Results  Component Value Date   INR 1.1 03/19/2021   Studies/Results: No results found.  Medications:   ceFAZolin (ANCEF) IV 1 g (03/26/21 2139)    Chlorhexidine Gluconate Cloth  6 each Topical Q0600   darbepoetin (ARANESP) injection - DIALYSIS  100 mcg Intravenous Q Mon-HD   diclofenac  1 patch Transdermal BID   doxercalciferol  3 mcg Intravenous Q M,W,F-HD   feeding supplement (NEPRO CARB STEADY)  237 mL Oral BID BM   gabapentin  100 mg Oral QHS   heparin  5,000 Units Subcutaneous Q8H   lidocaine  1 patch Transdermal Q24H   midodrine  10 mg Oral Q M,W,F-HD   multivitamin  1 tablet Oral QHS   polyethylene glycol  17 g Oral BID   senna-docusate  2 tablet Oral BID   sodium chloride flush  3 mL Intravenous Q12H   sucroferric oxyhydroxide  1,000 mg Oral TID WC  Dialysis Orders: [facility] M[facility]  3h 45mn  180NRe 400/600  64kg   2/2 bath  P4  AVF   -Heparin  2200 units IV TIW -Mircera 150 mcg IV q 2 week (last dose 150 mcg IV 02/28/2021) -Venofer 100 mg IV X 10 doses (4/6 doses given. 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  Neurosurgery evaluated and no intervention deemed necessary.  Seen by  ID input, started cefazolin (as likely MSSA given h/o MSSA bacteremia recently).  IR did CT guided aspiration of 60 cc on 8/8, site to small for drain placement per IR.  Per ID, Continue cefazolin with HD, plan for 6 to 8 weeks course. Tentative end date is 04/30/21. ESRD - Back on MWF schedule. Previously, he is not able to sit up in recliner without significant  discomfort and he says that his back pain is still too bad to sit up for next rx. He has been exercising in his room lately-recommend trying to place patient in the recliner for next HD tx if possible. He will need rehab before he can be discharged to home for outpatient dialysis.  Hypertension/volume  - As noted above. BP well controlled. Continue midodrine 10 mg PO TIW 30 prior to HD. May repeat mid HD if necessary. No evidence of overt volume overload by exam on admit and currently. Now very much under EDW. Will need EDW reset on discharge. UF as tolerated.   Anemia  - HGB 9.9.  Missed OP ESA 08/03. Last ESA dose given 03/22/21. Hold Fe load in setting of infection.  Metabolic bone disease - Continue velphoro 500 mg 2 tabs PO TID AC. Continue VDRA. Labs at goal.  Nutrition - Renal carb mod diet, nepro, renal vits. DM-No meds on OP list. Last HA1c 6.9. per primary Disposition - will likely need SNF given severe debilitation from back pain and inability to sit up/ambulate.    EMadelon LipsMD CDeltavillePgr [phone]/16/2022,11:33 AM  LOS: 12 days

## 2021-03-27 NOTE — TOC Initial Note (Signed)
Transition of Care [facility]) - Initial/Assessment Note    Patient Details  Name: Ryan Wells MRN: [id number] Date of Birth: [birth year]  Transition of Care [facility]) CM/SW Contact:    Joanne Chars, LCSW Phone Number: 03/27/2021, 4:05 PM  Clinical Narrative:   CSW spoke with pt with assistance of [place] [employer].  Discussed primary plan of CIR but need to have back up plan of SNF.  Pt agreeable to this, choice document given.  Permission given to speak with wife Ryan Wells.  Pt reports she works 3am-1pm.  Pt reports he has had covid vaccine and one booster.                  Expected Discharge Plan: IP Rehab Facility Barriers to Discharge: Other (must enter comment), Continued Medical Work up (ability to sit for HD)   Patient Goals and CMS Choice Patient states their goals for this hospitalization and ongoing recovery are:: "recover completely"      Expected Discharge Plan and Services Expected Discharge Plan: [place]     Post Acute Care Choice: IP Rehab Living arrangements for the past 2 months: Single Family Home                                      Prior Living Arrangements/Services Living arrangements for the past 2 months: Single Family Home Lives with:: Spouse, Adult Children Patient language and need for interpreter reviewed:: Yes ([employer] interpre[employer] used by phone)        Need for Family Participation in Patient Care: No (Comment) Care giver support system in place?: Yes (comment) Current home services: Other (comment) (none) Criminal Activity/Legal Involvement Pertinent to Current Situation/Hospitalization: No - Comment as needed  Activities of Daily Living Home Assistive Devices/Equipment: None ADL Screening (condition at time of admission) Patient's cognitive ability adequate to safely complete daily activities?: Yes Is the patient deaf or have difficulty hearing?: No Does the patient have difficulty seeing, even when wearing  glasses/contacts?: No Does the patient have difficulty concentrating, remembering, or making decisions?: No Patient able to express need for assistance with ADLs?: Yes Does the patient have difficulty dressing or bathing?: No Independently performs ADLs?: Yes (appropriate for developmental age) Does the patient have difficulty walking or climbing stairs?: Yes Weakness of Legs: None Weakness of Arms/Hands: None  Permission Sought/Granted Permission sought to share information with : Family Supports Permission granted to share information with : Yes, Verbal Permission Granted  Share Information with NAME: wife Ryan Wells  Permission granted to share info w AGENCY: SNF        Emotional Assessment Appearance:: Appears stated age Attitude/Demeanor/Rapport: Engaged Affect (typically observed): Appropriate Orientation: : Oriented to Self, Oriented to Place, Oriented to  Time, Oriented to Situation Alcohol / Substance Use: Not Applicable Psych Involvement: No (comment)  Admission diagnosis:  Acute pulmonary edema (HCC) [J81.0] Pulmonary edema [J81.1] Pain [R52] Abnormal EKG [R94.31] Elevated troponin [R77.8] Acute bacterial conjunctivitis of left eye [H10.32] Preseptal cellulitis of left eye [L03.213] Fluid overload [E87.70] Patient Active Problem List   Diagnosis Date Noted   Pleural effusion 03/15/2021   Fluid overload 03/15/2021   Elevated troponin 03/15/2021   Lumbar back pain 03/15/2021   Leg pain 03/15/2021   Leukocytosis 03/15/2021   History of bacteremia 03/15/2021   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 difficile diarrhea    MSSA bacteremia    ESRD on hemodialysis ([place])  ESRD on dialysis ([place]) 07/21/2019   Volume overload [id number]   Metabolic acidosis    Hyperkalemia    CKD (chronic kidney disease), stage IV (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

## 2021-03-27 NOTE — Progress Notes (Signed)
Physical Therapy Treatment Note  Clinical impression: Despite pre-medication with IV pain meds, pt continues to be very limited by pain in his back and LLE. Pt reports he walked to door and back earlier today (with OT) and reports was up to chair 2 additional times by "someone else" (?nursing). After lengthy education on reason mobility is important (all via virtual interpreter service), he agreed to try to stand with PT. He was not able to make it as far as standing, but did sit on the EOB very briefly before returning to supine. Patient is visibly in pain and states he wishes he could get up and "run out of here," and the pain is too much for him to tolerate.     03/27/21 1618  PT Visit Information  Last PT Received On 03/27/21  Assistance Needed +1  History of Present Illness Pt is 65 y.o. male presenting to ED on 8/3 with severe back/leg pain x2 weeks and diaphoresis.  Pt admitted with lumbar sacral osteomyelitis/discitis with septic arthritis L5-S1 and bil psoas abscess, R sided pleural effusion. . 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 At time of previous admission MRI (+) L5-S1 facet osteoarthritis, synovial cysts and periarticular soft tissue edema on the left. 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 Returned to ED 7/25 c/o radicular low back pain. 8/8 CT guided aspiration of left psoas abscess   PMHx significant for ESRD on HD, HTN, DMII, chornic anemia and MSSA.  Subjective Data  Patient Stated Goal to be able to do for myself  Precautions  Precautions Fall  Precaution Comments Back pain - back precautions for comfort  Pain Assessment  Pain Assessment 0-10  Faces Pain Scale 10  Pain Location L leg, and back  Pain Descriptors / Indicators Discomfort;Grimacing;Guarding;Moaning  Pain Intervention(s) Limited activity within patient's tolerance;Monitored during session;Premedicated before session;Repositioned   Cognition  Arousal/Alertness Awake/alert  Behavior During Therapy Anxious  Overall Cognitive Status Within Functional Limits for tasks assessed  Difficult to assess due to Non-English speaking (used virtual interpreter)  Bed Mobility  Overal bed mobility Needs Assistance  Bed Mobility Rolling;Sidelying to Sit;Sit to Sidelying  Rolling Supervision (with rail)  Sidelying to sit Min assist  Sit to sidelying Min assist  General bed mobility comments uses rail for rolling; assist to raise torso to sitting and to lift legs to sidelyining  Balance  Overall balance assessment Needs assistance  Sitting-balance support Bilateral upper extremity supported;Feet supported  Sitting balance-Leahy Scale Fair  Sitting balance - Comments  (limited by pain; pushing toward side to offload hip at times)  Exercises  Exercises Other exercises;General Lower Extremity  General Exercises - Lower Extremity  Ankle Circles/Pumps AROM;Both;10 reps;Supine  Heel Slides AROM;Both;10 reps;Supine  Hip ABduction/ADduction AROM;Both;10 reps  Straight Leg Raises Other (comment) (educated NOT to do this exercise)  Other Exercises  Other Exercises continued to encourage particiaption in bed level exercises previously taught  PT - End of Session  Activity Tolerance Patient limited by pain  Patient left with call bell/phone within reach;in bed;with bed alarm set  Nurse Communication Mobility status   PT - Assessment/Plan  PT Plan Current plan remains appropriate  PT Visit Diagnosis Difficulty in walking, not elsewhere classified (R26.2);Pain  Pain - Right/Left  (central back; bil LEs)  Pain - part of body  (back)  PT Frequency (ACUTE ONLY) Min 4X/week  Recommendations for Other Services Rehab consult  Follow Up Recommendations CIR  PT equipment Other (comment) (  further assessment at next venue as pain improves)  AM-PAC PT "6 Clicks" Mobility Outcome Measure (Version 2)  Help needed turning from your back to  your side while in a flat bed without using bedrails? 3  Help needed moving from lying on your back to sitting on the side of a flat bed without using bedrails? 2  Help needed moving to and from a bed to a chair (including a wheelchair)? 2  Help needed standing up from a chair using your arms (e.g., wheelchair or bedside chair)? 2  Help needed to walk in hospital room? 2  Help needed climbing 3-5 steps with a railing?  1  6 Click Score 12  Consider Recommendation of Discharge To: CIR/SNF/LTACH  Progressive Mobility  What is the highest level of mobility based on the progressive mobility assessment? Level 4 (Walks with assist in room) - Balance while marching in place and cannot step forward and back - Complete  Mobility Ambulated with assistance in room  PT Goal Progression  Progress towards PT goals Not progressing toward goals - comment (limited by pain this session)  Acute Rehab PT Goals  Time For Goal Achievement 03/31/21  Potential to Achieve Goals Fair  PT Time Calculation  PT Start Time (ACUTE ONLY) 1408  PT Stop Time (ACUTE ONLY) 1445  PT Time Calculation (min) (ACUTE ONLY) 37 min  PT General Charges  $$ ACUTE PT VISIT 1 Visit  PT Treatments  $Therapeutic Activity 8-22 mins  $Self Care/Home Management 8-22     Arby Barrette, PT Pager [phone]

## 2021-03-28 DIAGNOSIS — R778 Other specified abnormalities of plasma proteins: Secondary | ICD-10-CM | POA: Diagnosis not present

## 2021-03-28 DIAGNOSIS — E119 Type 2 diabetes mellitus without complications: Secondary | ICD-10-CM | POA: Diagnosis not present

## 2021-03-28 DIAGNOSIS — N186 End stage renal disease: Secondary | ICD-10-CM | POA: Diagnosis not present

## 2021-03-28 DIAGNOSIS — M545 Low back pain, unspecified: Secondary | ICD-10-CM | POA: Diagnosis not present

## 2021-03-28 LAB — RENAL FUNCTION PANEL
Albumin: 2.5 g/dL — ABNORMAL LOW (ref 3.5–5.0)
Anion gap: 12 (ref 5–15)
BUN: 45 mg/dL — ABNORMAL HIGH (ref 8–23)
CO2: 28 mmol/L (ref 22–32)
Calcium: 9 mg/dL (ref 8.9–10.3)
Chloride: 92 mmol/L — ABNORMAL LOW (ref 98–111)
Creatinine, Ser: 6.94 mg/dL — ABNORMAL HIGH (ref 0.61–1.24)
GFR, Estimated: 8 mL/min — ABNORMAL LOW (ref >60)
Glucose, Bld: 155 mg/dL — ABNORMAL HIGH (ref 70–99)
Phosphorus: 3.7 mg/dL (ref 2.5–4.6)
Potassium: 5 mmol/L (ref 3.5–5.1)
Sodium: 132 mmol/L — ABNORMAL LOW (ref 135–145)

## 2021-03-28 MED ORDER — FENTANYL 25 MCG/HR TD PT72
1.0000 | MEDICATED_PATCH | TRANSDERMAL | Status: DC
Start: 2021-03-28 — End: 2021-04-02
  Administered 2021-03-28 – 2021-03-31 (×2): 1 via TRANSDERMAL
  Filled 2021-03-28 (×2): qty 1

## 2021-03-28 NOTE — Progress Notes (Signed)
Physical Therapy Treatment Patient Details Name: AUTREY FORTINO MRN: [id number] DOB: 1956-01-06 Today's Date: 03/28/2021    History of Present Illness Pt is 65 y.o. male presenting to ED on 8/3 with severe back/leg pain x2 weeks and diaphoresis.  Pt admitted with lumbar sacral osteomyelitis/discitis with septic arthritis L5-S1 and bil psoas abscess, R sided pleural effusion. . 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 At time of previous admission MRI (+) L5-S1 facet osteoarthritis, synovial cysts and periarticular soft tissue edema on the left. 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 Returned to ED 7/25 c/o radicular low back pain. 8/8 CT guided aspiration of left psoas abscess   PMHx significant for ESRD on HD, HTN, DMII, chornic anemia and MSSA.    PT Comments    Via interpreter, pt reporting severe pain the remainder of yesterday and last night (making it so he could not sleep) after walking and getting on his feet 3x yesterday. He respectfully requested we only do what his body was telling him he could tolerate and no more. Patient is tolerating bed level exercises far better and needing less assist with bed mobility. He was able to stand from elevated EOB with min assist, but was unable to tolerate the pain to progress to ambulation. After seated rest at EOB, stood a second time and then returned to supine with HOB elevated.   *Increased time due to pain and use of interpreter.     Follow Up Recommendations  CIR     Equipment Recommendations  Other (comment);Rolling walker with 5" wheels (further assessment at next venue as pain improves)    Recommendations for Other Services Rehab consult     Precautions / Restrictions Precautions Precautions: Fall Precaution Comments: Back pain - back precautions for comfort Restrictions Weight Bearing Restrictions: No    Mobility  Bed Mobility Overal bed mobility:  Needs Assistance Bed Mobility: Rolling;Sidelying to Sit;Sit to Sidelying Rolling: Supervision (with rail) Sidelying to sit: Min guard     Sit to sidelying: Min assist General bed mobility comments: uses rail for rolling; assist to lift legs to sidelying    Transfers Overall transfer level: Needs assistance Equipment used: Rolling walker (2 wheeled) Transfers: Sit to/from Stand Sit to Stand: Min assist;From elevated surface         General transfer comment: bed elevated to reduce pain/effort to come to stand with hopes of progressing to walking (unable); stood x 2 from EOB  Ambulation/Gait             General Gait Details: pt begging not to try to walk as legs and back hurting too much and knows he cannot do it; pt visibly shaking as standing and returned to sit EOB   Stairs             Wheelchair Mobility    Modified Rankin (Stroke Patients Only)       Balance Overall balance assessment: Needs assistance Sitting-balance support: Bilateral upper extremity supported;Feet supported Sitting balance-Leahy Scale: Fair Sitting balance - Comments:  (limited by pain; pushing toward side to offload hip at times)   Standing balance support: Bilateral upper extremity supported Standing balance-Leahy Scale: Poor Standing balance comment: Requiring walker and assist of 2                            Cognition Arousal/Alertness: Awake/alert Behavior During Therapy: Anxious Overall Cognitive Status: Within Functional Limits for  tasks assessed                                        Exercises General Exercises - Lower Extremity Ankle Circles/Pumps: AROM;Both;10 reps;Supine Heel Slides: AROM;Both;10 reps;Supine Other Exercises Other Exercises: continued to encourage particiaption in bed level exercises previously taught    General Comments        Pertinent Vitals/Pain Pain Assessment: 0-10 Pain Score: 8  Pain Location: L leg, and  back Pain Descriptors / Indicators: Discomfort;Grimacing;Guarding;Moaning;Sharp Pain Intervention(s): Limited activity within patient's tolerance;Monitored during session;Premedicated before session;Repositioned    Home Living                      Prior Function            PT Goals (current goals can now be found in the care plan section) Acute Rehab PT Goals Patient Stated Goal: to be able to do for myself Time For Goal Achievement: 03/31/21 Potential to Achieve Goals: Fair Progress towards PT goals: Progressing toward goals    Frequency    Min 4X/week      PT Plan Current plan remains appropriate    Co-evaluation              AM-PAC PT "6 Clicks" Mobility   Outcome Measure  Help needed turning from your back to your side while in a flat bed without using bedrails?: A Little Help needed moving from lying on your back to sitting on the side of a flat bed without using bedrails?: A Lot Help needed moving to and from a bed to a chair (including a wheelchair)?: A Lot Help needed standing up from a chair using your arms (e.g., wheelchair or bedside chair)?: A Lot Help needed to walk in hospital room?: A Lot Help needed climbing 3-5 steps with a railing? : Total 6 Click Score: 12    End of Session Equipment Utilized During Treatment: Gait belt Activity Tolerance: Patient limited by pain Patient left: with call bell/phone within reach;in bed Nurse Communication: Mobility status PT Visit Diagnosis: Difficulty in walking, not elsewhere classified (R26.2);Pain Pain - Right/Left:  (central back; bil LEs) Pain - part of body:  (back)     Time: [id number] PT Time Calculation (min) (ACUTE ONLY): 38 min  Charges:  $Therapeutic Exercise: 8-22 mins $Therapeutic Activity: 23-37 mins                      Arby Barrette, PT Pager [phone]    Rexanne Mano 03/28/2021, 12:04 PM

## 2021-03-28 NOTE — Progress Notes (Addendum)
PROGRESS NOTE    Ryan Wells  [id number] DOB: [birth year] DOA: 03/14/2021 PCP: Pcp, No    Brief Narrative:   Ryan Wells is a 65 y.o. male with medical history of end-stage renal disease on hemodialysis, essential hypertension, diabetes mellitus type 2, anemia of chronic kidney disease,  presented to the hospital with back and leg pain worsening for 2 weeks.  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  It appears during this hospitalization he had MRI imaging which noted bilateral L5-S1 facet osteoarthritis left greater than right with synovial cysts and periarticular soft tissue edema on the left which superimposed septic arthritis could not be excluded and anterior displacement of the cord with distortion at T6-T7.  He had completed 6 weeks of IV antibiotics with dialysis per ID notes from 8/2.  Patient was again seen in the emergency department on 7/25 for radicular pain and neurosurgery had recommended short course of steroids.On this admission, in the emergency department patient was seen to be afebrile with stable vital signs.  CT imaging of the head and orbits negative for any acute intracranial pathology, but noted left facial and periorbital soft tissue contusion.  Labs from 8/3 significant for WBC 15.1, hemoglobin 10, potassium 4.4, BUN 63, creatinine 8.5, anion gap 20, and high-sensitivity troponin 84->101.  Chest x-ray significant for cardiomegaly with vascular congestion, focal area with pulmonary nodularity of the right mid lung correlates with cavitating lesion and a small right-sided pleural effusion.  Patient was then admitted to the hospital for further evaluation and treatment.  During hospitalization, patient has been adjusted on  pain medication regimen.  Currently, awaiting for skilled nursing facility/CIR placement but being limited by ability to  sustain the course at hemodialysis unit on sitting.  Still persists to have severe pain limiting physical activity.   Principal Problem:   Lumbar back pain Active Problems:   Subclinical hypothyroidism   Anemia due to chronic kidney disease   Diabetes mellitus type II, non insulin dependent (HCC)   ESRD on hemodialysis (HCC)   Pleural effusion   Elevated troponin   Leg pain   Leukocytosis   History of bacteremia  Lumbar/sacral osteomyelitis/discitis, L5-S1, Septic arthritis L5-S1, and on admission Bilateral psoas abscess s/p IR aspiration Patient presented with worsening lumbar back pain with radiation of pain.  He does complain of persistent pain especially while sitting down.  Had recently completed 6 weeks course of antibiotic as per infectious disease.  History of MSSA bacteremia on 03/13/2021.  WBC was elevated at 15.1 on presentation.    MRI of the lumbosacral spine on 8/ 5/22 was notable for L5-S1 discitis with osteomyelitis with left psoas abscess.  IR drained 60 mL of purulent fluid from the left psoas region.  Neurosurgery and infectious disease followed the patient during hospitalization.  WBC has normalized.  Recent blood cultures negative.  Fluid culture from aspiration with  Staph aureus sensitive to all antibiotics.  Continue pain management and IV cefazolin.  Plan to continue cefazolin with hemodialysis for 6 to 8 weeks.  Tentative end date of antibiotics 04/30/2021  Intractable back pain.  Patient still with intractable pain especially during physical therapy limiting his ambulation.  It is likely that patient will continue to have chronic pain.  He has been in the hospital for almost 2 weeks.  We will add fentanyl patch to assist in better pain control in view of his  end-stage renal disease.  He is already on as needed Dilaudid IV and oral oxycodone.  Patient also has Lidoderm in the clinic patch.  Neurontin dose has been increased 200 mg 3 times a day.  Need to watch closely for  excessive sedation but patient seems to be alert awake with ongoing pain.  Left face contusion:  On CT scan.  No erythema.  Continue supportive care    Elevated troponin:  On presentation.  ACS ruled out.  Right-sided pleural effusion:  On presentation.  Continue hemodialysis for fluid management.  No respiratory distress.   ESRD on HD with hyperkalemia.:  Continue hemodialysis as per nephrology.  Diabetes mellitus type 2:  Latest hemoglobin A1c of 5.1 on 01/16/2021.  Continue to monitor closely.   History of MSSA bacteremia:  Has previously completed 6 weeks of antibiotic.  Had TEE with no evidence of endocarditis in the past.  On IV cefazolin at this time.  Plan to continue IV antibiotic for 6 to 8 weeks as per ID recommendation.   Anemia chronic kidney disease:  Baseline hemoglobin of around 8.0.  Latest hemoglobin of 9.6.  We will continue to monitor as outpatient   Subclinical hypothyroidism:  T4 normal.  TSH elevated.  Will need outpatient follow-up and repeat testing in 4 to 6 weeks.  Weakness/deconditioning/gait disturbance: Physical therapy on board and recommending CIR on discharge.  Currently awaiting for disposition plan.  DVT prophylaxis: heparin injection 5,000 Units Start: 03/15/21 1400   CODE STATUS-full code  Family Communication:   I tried to reach the patient's spouse on the phone but was unable to reach her today  Disposition Plan:   Level of care: Med-Surg  Status is: Inpatient  Remains inpatient appropriate because:  IV treatments appropriate due to intensity of illness or inability to take PO, and Inpatient level of care appropriate due to severity of illness, awaiting for placement.  Dispo: The patient is from: Home              Anticipated d/c is to: CIR but unable to tolerate hemodialysis chair so far.              Patient currently is medically stable to d/c.   Difficult to place patient No  Consultants:  Neurosurgery Nephrology Infectious  disease Interventional radiology  Procedures:  IR guided aspiration of the left psoas abscess on 03/19/2021 with removal of 60 mL purulent fluid.  Antimicrobials:  Ceftriaxone 8/4 - 8/6 Cefazolin 8/6>>  Subjective:  Today, patient was seen and examined at bedside.  Has been having difficulty with physical therapy due to severe back pain.  Objective: Vitals:   03/26/21 2137 03/27/21 0434 03/27/21 1734 03/27/21 2100  BP: 119/64 (!) 144/60 (!) 126/57 (!) 147/63  Pulse:  70 75 70  Resp:  '18 17 16  '$ Temp:  98.9 F (37.2 C) 98.5 F (36.9 C) 98.4 F (36.9 C)  TempSrc:  Oral Oral Oral  SpO2:  100% 98% 98%  Weight:      Height:        Intake/Output Summary (Last 24 hours) at 03/28/2021 1051 Last data filed at 03/27/2021 1600 Gross per 24 hour  Intake --  Output 250 ml  Net -250 ml     Filed Weights   03/22/21 1230 03/26/21 1217 03/26/21 1540  Weight: 57.7 kg 63.2 kg 62 kg   Physical examination:  General:  Average built, not in obvious distress while lying down, not in obvious distress, alert awake and oriented,  HENT:   No scleral pallor or icterus noted. Oral mucosa is moist.  Chest:  Clear breath sounds.  Diminished breath sounds bilaterally. No crackles or wheezes.  CVS: S1 &S2 heard. No murmur.  Regular rate and rhythm. Abdomen: Soft, nontender, nondistended.  Bowel sounds are heard.   Extremities: No cyanosis, clubbing or edema.  Peripheral pulses are palpable. Psych: Alert, awake and oriented,  CNS:  No cranial nerve deficits.  Moves extremities. Skin: Warm and dry.  No rashes noted.   Data Reviewed: I have personally reviewed the following labs and imaging studies   CBC: Recent Labs  Lab 03/22/21 0452 03/22/21 0830 03/23/21 0206 03/25/21 0306 03/27/21 0517  WBC 8.0 8.9 6.7 6.9 8.5  HGB 10.0* 9.9* 9.8* 9.9* 9.6*  HCT 32.4* 32.8* 32.8* 32.5* 32.7*  MCV 98.5 97.9 99.7 97.9 99.1  PLT 207 227 219 224 [id number]    Basic Metabolic Panel: Recent Labs  Lab  03/24/21 0218 03/25/21 0306 03/26/21 0519 03/27/21 0517 03/28/21 0251  NA 135 132* 132* 135 132*  K 4.4 4.8 5.3* 4.8 5.0  CL 94* 92* 93* 96* 92*  CO2 32 '28 27 27 28  '$ GLUCOSE 146* 113* 129* 102* 155*  BUN 35* 46* 63* 31* 45*  CREATININE 6.60* 7.80* 9.61* 5.66* 6.94*  CALCIUM 9.0 9.3 8.9 8.7* 9.0  MG  --   --   --  2.3  --   PHOS 3.3 3.5 4.2 3.5 3.7    GFR: Estimated Creatinine Clearance: 8.1 mL/min (A) (by C-G formula based on SCr of 6.94 mg/dL (H)). Liver Function Tests: Recent Labs  Lab 03/24/21 0218 03/25/21 0306 03/26/21 0519 03/27/21 0517 03/28/21 0251  ALBUMIN 2.4* 2.5* 2.4* 2.6* 2.5*    No results for input(s): LIPASE, AMYLASE in the last 168 hours. No results for input(s): AMMONIA in the last 168 hours. Coagulation Profile: No results for input(s): INR, PROTIME in the last 168 hours.  Cardiac Enzymes: No results for input(s): CKTOTAL, CKMB, CKMBINDEX, TROPONINI in the last 168 hours. BNP (last 3 results) No results for input(s): PROBNP in the last 8760 hours. HbA1C: No results for input(s): HGBA1C in the last 72 hours. CBG: No results for input(s): GLUCAP in the last 168 hours. Lipid Profile: No results for input(s): CHOL, HDL, LDLCALC, TRIG, CHOLHDL, LDLDIRECT in the last 72 hours. Thyroid Function Tests: No results for input(s): TSH, T4TOTAL, FREET4, T3FREE, THYROIDAB in the last 72 hours.  Anemia Panel: No results for input(s): VITAMINB12, FOLATE, FERRITIN, TIBC, IRON, RETICCTPCT in the last 72 hours. Sepsis Labs: No results for input(s): PROCALCITON, LATICACIDVEN in the last 168 hours.  Recent Results (from the past 240 hour(s))  Aerobic/Anaerobic Culture w Gram Stain (surgical/deep wound)     Status: None   Collection Time: 03/19/21  5:09 PM   Specimen: Abscess  Result Value Ref Range Status   Specimen Description ABSCESS  Final   Special Requests LEFT PSOAS  Final   Gram Stain   Final    ABUNDANT WBC PRESENT,BOTH PMN AND MONONUCLEAR FEW GRAM  POSITIVE COCCI    Culture   Final    FEW STAPHYLOCOCCUS AUREUS NO ANAEROBES ISOLATED Performed at [facility], [address] [place], [place] [address]    Report Status 03/24/2021 FINAL  Final   Organism ID, Bacteria STAPHYLOCOCCUS AUREUS  Final      Susceptibility   Staphylococcus aureus - MIC*    CIPROFLOXACIN <=0.5 SENSITIVE Sensitive     ERYTHROMYCIN <=0.25 SENSITIVE Sensitive  GENTAMICIN <=0.5 SENSITIVE Sensitive     OXACILLIN 0.5 SENSITIVE Sensitive     TETRACYCLINE <=1 SENSITIVE Sensitive     VANCOMYCIN 1 SENSITIVE Sensitive     TRIMETH/SULFA <=10 SENSITIVE Sensitive     CLINDAMYCIN <=0.25 SENSITIVE Sensitive     RIFAMPIN <=0.5 SENSITIVE Sensitive     Inducible Clindamycin NEGATIVE Sensitive     * FEW STAPHYLOCOCCUS AUREUS      Radiology Studies: No results found.    Scheduled Meds:  Chlorhexidine Gluconate Cloth  6 each Topical Q0600   darbepoetin (ARANESP) injection - DIALYSIS  100 mcg Intravenous Q Mon-HD   diclofenac  1 patch Transdermal BID   doxercalciferol  3 mcg Intravenous Q M,W,F-HD   feeding supplement (NEPRO CARB STEADY)  237 mL Oral BID BM   gabapentin  100 mg Oral TID   heparin  5,000 Units Subcutaneous Q8H   lidocaine  1 patch Transdermal Q24H   midodrine  10 mg Oral Q M,W,F-HD   multivitamin  1 tablet Oral QHS   polyethylene glycol  17 g Oral BID   senna-docusate  2 tablet Oral BID   sodium chloride flush  3 mL Intravenous Q12H   sucroferric oxyhydroxide  1,000 mg Oral TID WC   Continuous Infusions:   ceFAZolin (ANCEF) IV 1 g (03/27/21 2108)     LOS: 13 days    Flora Lipps, MD [facility] 03/28/2021, 10:51 AM

## 2021-03-28 NOTE — Progress Notes (Signed)
Inpatient Rehabilitation Admissions Coordinator   SNF is my current recommendation for he can not tolerate the intensity required for CIR admit. Sitting for outpatient dialysis is a barrier for SNF also. I will follow from a distance. TOC SW, Greg, notified.  Danne Baxter, RN, MSN Rehab Admissions Coordinator [phone] 03/28/2021 1:01 PM

## 2021-03-28 NOTE — Plan of Care (Signed)

## 2021-03-28 NOTE — Plan of Care (Signed)
  Problem: Education: Goal: Knowledge of General Education information will improve Description: Including pain rating scale, medication(s)/side effects and non-pharmacologic comfort measures Outcome: Progressing   Problem: Activity: Goal: Risk for activity intolerance will decrease Outcome: Progressing   Problem: Coping: Goal: Level of anxiety will decrease Outcome: Progressing   

## 2021-03-28 NOTE — TOC Progression Note (Signed)
Transition of Care [facility]) - Progression Note    Patient Details  Name: Ryan Wells MRN: [id number] Date of Birth: Sep 21, 1955  Transition [facility]) CM/SW Contact  Joanne Chars, LCSW Phone Number: 03/28/2021, 1:41 PM  Clinical Narrative:   Per Pamala Hurry, CIR admit unlikely at present time.  Pt referral sent out in hub for SNF.     Expected Discharge Plan: IP Rehab Facility Barriers to Discharge: Other (must enter comment), Continued Medical Work up (ability to sit for HD)  Expected Discharge Plan and Services Expected Discharge Plan: [place]     Post Acute Care Choice: IP Rehab Living arrangements for the past 2 months: Single Family Home                                       Social Determinants of Health (SDOH) Interventions    Readmission Risk Interventions No flowsheet data found.

## 2021-03-28 NOTE — Progress Notes (Signed)
[facility] Progress Note   Subjective:     For HD today- reports continued back pain and difficulty sleeping.    Objective Vitals:   03/26/21 2137 03/27/21 0434 03/27/21 1734 03/27/21 2100  BP: 119/64 (!) 144/60 (!) 126/57 (!) 147/63  Pulse:  70 75 70  Resp:  '18 17 16  '$ Temp:  98.9 F (37.2 C) 98.5 F (36.9 C) 98.4 F (36.9 C)  TempSrc:  Oral Oral Oral  SpO2:  100% 98% 98%  Weight:      Height:       Physical Exam General: NAD Heart: RRR Lungs: clear bilaterally Abdomen:Soft, non-tender, (+) bowel sounds Extremities:No edema BLLE Dialysis Access: L forearm AVF (+) Bruit/Thrill   Filed Weights   03/22/21 1230 03/26/21 1217 03/26/21 1540  Weight: 57.7 kg 63.2 kg 62 kg    Intake/Output Summary (Last 24 hours) at 03/28/2021 1226 Last data filed at 03/27/2021 1600 Gross per 24 hour  Intake --  Output 250 ml  Net -250 ml    Additional Objective Labs: Basic Metabolic Panel: Recent Labs  Lab 03/26/21 0519 03/27/21 0517 03/28/21 0251  NA 132* 135 132*  K 5.3* 4.8 5.0  CL 93* 96* 92*  CO2 '27 27 28  '$ GLUCOSE 129* 102* 155*  BUN 63* 31* 45*  CREATININE 9.61* 5.66* 6.94*  CALCIUM 8.9 8.7* 9.0  PHOS 4.2 3.5 3.7   Liver Function Tests: Recent Labs  Lab 03/26/21 0519 03/27/21 0517 03/28/21 0251  ALBUMIN 2.4* 2.6* 2.5*   No results for input(s): LIPASE, AMYLASE in the last 168 hours. CBC: Recent Labs  Lab 03/22/21 0452 03/22/21 0830 03/23/21 0206 03/25/21 0306 03/27/21 0517  WBC 8.0 8.9 6.7 6.9 8.5  HGB 10.0* 9.9* 9.8* 9.9* 9.6*  HCT 32.4* 32.8* 32.8* 32.5* 32.7*  MCV 98.5 97.9 99.7 97.9 99.1  PLT 207 227 219 224 302   Blood Culture    Component Value Date/Time   SDES ABSCESS 03/19/2021 1709   SPECREQUEST LEFT PSOAS 03/19/2021 1709   CULT  03/19/2021 1709    FEW STAPHYLOCOCCUS AUREUS NO ANAEROBES ISOLATED Performed at [facility] [address] [place], [place] [address]    REPTSTATUS 03/24/2021 FINAL 03/19/2021 1709     Cardiac Enzymes: No results for input(s): CKTOTAL, CKMB, CKMBINDEX, TROPONINI in the last 168 hours. CBG: No results for input(s): GLUCAP in the last 168 hours. Iron Studies: No results for input(s): IRON, TIBC, TRANSFERRIN, FERRITIN in the last 72 hours. Lab Results  Component Value Date   INR 1.1 03/19/2021   Studies/Results: No results found.  Medications:   ceFAZolin (ANCEF) IV 1 g (03/27/21 2108)    Chlorhexidine Gluconate Cloth  6 each Topical Q0600   darbepoetin (ARANESP) injection - DIALYSIS  100 mcg Intravenous Q Mon-HD   diclofenac  1 patch Transdermal BID   doxercalciferol  3 mcg Intravenous Q M,W,F-HD   feeding supplement (NEPRO CARB STEADY)  237 mL Oral BID BM   fentaNYL  1 patch Transdermal Q72H   gabapentin  100 mg Oral TID   heparin  5,000 Units Subcutaneous Q8H   lidocaine  1 patch Transdermal Q24H   midodrine  10 mg Oral Q M,W,F-HD   multivitamin  1 tablet Oral QHS   polyethylene glycol  17 g Oral BID   senna-docusate  2 tablet Oral BID   sodium chloride flush  3 mL Intravenous Q12H   sucroferric oxyhydroxide  1,000 mg Oral TID WC    Dialysis Orders: [place]  MWF  3h 70mn  180NRe 400/600  64kg   2/2 bath  P4  AVF   -Heparin  2200 units IV TIW -Mircera 150 mcg IV q 2 week (last dose 150 mcg IV 02/28/2021) -Venofer 100 mg IV X 10 doses (4/6 doses given. 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  Neurosurgery evaluated and no intervention deemed necessary.  Seen by  ID input, started cefazolin (as likely MSSA given h/o MSSA bacteremia recently).  IR did CT guided aspiration of 60 cc on 8/8, site to small for drain placement per IR.  Per ID, Continue cefazolin with HD, plan for 6 to 8 weeks course. Tentative end date is 04/30/21. ESRD - Back on MWF schedule. Previously, he is not able to sit up in recliner without significant  discomfort and he says that his back pain is still too bad to sit up for next rx. He has been exercising in his room lately. He will need rehab before he can be discharged to home for outpatient dialysis.  Hypertension/volume  - As noted above. BP well controlled. Continue midodrine 10 mg PO TIW 30 prior to HD. May repeat mid HD if necessary. No evidence of overt volume overload by exam on admit and currently. Now very much under EDW. Will need EDW reset on discharge. UF as tolerated.   Anemia  - HGB 9.9.  Missed OP ESA 08/03. Last ESA dose given 03/22/21. Hold Fe load in setting of infection.  Metabolic bone disease - Continue velphoro 500 mg 2 tabs PO TID AC. Continue VDRA. Labs at goal.  Nutrition - Renal carb mod diet, nepro, renal vits. DM-No meds on OP list. Last HA1c 6.9. per primary Disposition - will likely need SNF given severe debilitation from back pain and inability to sit up/ambulate.    EMadelon LipsMD CStewart ManorPgr [phone]/17/2022,12:26 PM  LOS: 13 days

## 2021-03-28 NOTE — NC FL2 (Signed)
[place] LEVEL OF CARE SCREENING TOOL     IDENTIFICATION  Patient Name: Ryan Wells Birthdate: [birth year] Sex: male Admission Date (Current Location): 03/14/2021  [facility] and [place] Number:  Herbalist and Address:  [facility] [place], [address], [place], [place] [address]      Provider Number: [id number]  Attending Physician Name and Address:  Flora Lipps, MD  Relative Name and Phone Number:  Aurelio Brash [phone]    Current Level of Care: Hospital Recommended Level of Care: [place] Prior Approval Number:    Date Approved/Denied:   PASRR Number: [id number]  Discharge Plan: SNF    Current Diagnoses: Patient Active Problem List   Diagnosis Date Noted   Pleural effusion 03/15/2021   Fluid overload 03/15/2021   Elevated troponin 03/15/2021   Lumbar back pain 03/15/2021   Leg pain 03/15/2021   Leukocytosis 03/15/2021   History of bacteremia 03/15/2021   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   Long term use of drug 07/29/2016   Joint pain 07/12/2016   Subclinical hypothyroidism 07/12/2014   Diabetes type 2, uncontrolled ([place]) 11/25/2011    Orientation RESPIRATION BLADDER Height & Weight     Self, Time, Situation, Place  Normal Continent Weight: 136 lb 11 oz (62 kg) Height:  '5\' 2"'$  (157.5 cm)  BEHAVIORAL SYMPTOMS/MOOD NEUROLOGICAL BOWEL NUTRITION STATUS      Incontinent  Diet (see discharge summary)  AMBULATORY STATUS COMMUNICATION OF NEEDS Skin   Total Care Verbally Normal                       Personal Care Assistance Level of Assistance  Bathing, Feeding, Dressing Bathing Assistance: Maximum assistance Feeding assistance: Independent Dressing Assistance: Maximum assistance     Functional Limitations Info  Sight, Hearing, Speech Sight Info: Adequate Hearing Info: Adequate Speech Info: Adequate    SPECIAL CARE FACTORS FREQUENCY  PT (By licensed PT), OT (By licensed OT)     PT Frequency: 5x week OT Frequency: 5x week            Contractures Contractures Info: Not present    Additional Factors Info  Code Status, Allergies Code Status Info: full Allergies Info: nka           Current Medications (03/28/2021):  This is the current hospital active medication list Current Facility-Administered Medications  Medication Dose Route Frequency Provider Last Rate Last Admin   acetaminophen (TYLENOL) tablet 650 mg  650 mg Oral Q6H PRN Fuller Plan A, MD   650 mg at 03/15/21 1628   Or   acetaminophen (TYLENOL) suppository 650 mg  650 mg Rectal Q6H PRN Fuller Plan A, MD       albuterol (PROVENTIL) (2.5 MG/3ML) 0.083% nebulizer solution 2.5 mg  2.5 mg Nebulization Q6H PRN Smith, Rondell A, MD       ceFAZolin (ANCEF) IVPB 1 g/50 mL premix  1 g  Intravenous Q24H Levonne Spiller, [place] 100 mL/hr at 03/27/21 2108 1 g at 03/27/21 2108   Chlorhexidine Gluconate Cloth 2 % PADS 6 each  6 each Topical Q0600 Valentina Gu, NP   6 each at 03/28/21 1029   Darbepoetin Alfa (ARANESP) injection 100 mcg  100 mcg Intravenous Q Mon-HD Valentina Gu, NP   100 mcg at 03/26/21 1525   diclofenac (FLECTOR) 1.3 % 1 patch  1 patch Transdermal BID Pokhrel, Corrie Mckusick, MD   1 patch at 03/28/21 [id number]   doxercalciferol (HECTOROL) injection 3 mcg  3 mcg Intravenous Q M,W,F-HD Pokhrel, Laxman, MD   3 mcg at 03/26/21 1525   feeding supplement (NEPRO CARB STEADY)  liquid 237 mL  237 mL Oral BID BM Valentina Gu, NP   237 mL at 03/28/21 0827   fentaNYL (DURAGESIC) 25 MCG/HR 1 patch  1 patch Transdermal Q72H Pokhrel, Laxman, MD       gabapentin (NEURONTIN) capsule 100 mg  100 mg Oral TID Pokhrel, Laxman, MD   100 mg at 03/28/21 0827   heparin injection 5,000 Units  5,000 Units Subcutaneous Q8H Smith, Rondell A, MD   5,000 Units at 03/28/21 [id number]   HYDROmorphone (DILAUDID) injection 1 mg  1 mg Intravenous Q3H PRN [place] Donnamarie Poag, DO   1 mg at 03/28/21 1029   hydrOXYzine (ATARAX/VISTARIL) tablet 10 mg  10 mg Oral TID PRN Pokhrel, Laxman, MD       lidocaine (LIDODERM) 5 % 1 patch  1 patch Transdermal Q24H Pokhrel, Laxman, MD   1 patch at 03/27/21 2100   midodrine (PROAMATINE) tablet 10 mg  10 mg Oral Q M,W,F-HD Rolla Flatten, RPH   10 mg at 03/26/21 1359   multivitamin (RENA-VIT) tablet 1 tablet  1 tablet Oral QHS Valentina Gu, NP   1 tablet at 03/27/21 2104   oxyCODONE-acetaminophen (PERCOCET/ROXICET) 5-325 MG per tablet 1-2 tablet  1-2 tablet Oral Q4H PRN Pokhrel, Corrie Mckusick, MD   2 tablet at 03/28/21 0827   polyethylene glycol (MIRALAX / GLYCOLAX) packet 17 g  17 g Oral BID Pokhrel, Laxman, MD   17 g at 03/28/21 0827   senna-docusate (Senokot-S) tablet 2 tablet  2 tablet Oral BID Pokhrel, Laxman, MD   2 tablet at 03/28/21 0827   sodium chloride flush (NS) 0.9 % injection 3 mL  3 mL Intravenous Q12H Smith, Rondell A, MD   3 mL at 03/28/21 0857   sucroferric oxyhydroxide (VELPHORO) chewable tablet 1,000 mg  1,000 mg Oral TID WC Valentina Gu, NP   1,000 mg at 03/28/21 0830     Discharge Medications: Please see discharge summary for a list of discharge medications.  Relevant Imaging Results:  Relevant Lab Results:   Additional Information SSN [id number], covid vaccinated with booster. Spanish speaking.  Joanne Chars, LCSW

## 2021-03-29 DIAGNOSIS — M545 Low back pain, unspecified: Secondary | ICD-10-CM | POA: Diagnosis not present

## 2021-03-29 DIAGNOSIS — N186 End stage renal disease: Secondary | ICD-10-CM | POA: Diagnosis not present

## 2021-03-29 DIAGNOSIS — R778 Other specified abnormalities of plasma proteins: Secondary | ICD-10-CM | POA: Diagnosis not present

## 2021-03-29 DIAGNOSIS — E119 Type 2 diabetes mellitus without complications: Secondary | ICD-10-CM | POA: Diagnosis not present

## 2021-03-29 LAB — CBC
HCT: 29.5 % — ABNORMAL LOW (ref 39.0–52.0)
Hemoglobin: 8.8 g/dL — ABNORMAL LOW (ref 13.0–17.0)
MCH: 29.2 pg (ref 26.0–34.0)
MCHC: 29.8 g/dL — ABNORMAL LOW (ref 30.0–36.0)
MCV: 98 fL (ref 80.0–100.0)
Platelets: 347 10*3/uL (ref 150–400)
RBC: 3.01 MIL/uL — ABNORMAL LOW (ref 4.22–5.81)
RDW: 16.4 % — ABNORMAL HIGH (ref 11.5–15.5)
WBC: 15.4 10*3/uL — ABNORMAL HIGH (ref 4.0–10.5)
nRBC: 0 % (ref 0.0–0.2)

## 2021-03-29 LAB — RENAL FUNCTION PANEL
Albumin: 2.6 g/dL — ABNORMAL LOW (ref 3.5–5.0)
Anion gap: 10 (ref 5–15)
BUN: 57 mg/dL — ABNORMAL HIGH (ref 8–23)
CO2: 30 mmol/L (ref 22–32)
Calcium: 9.4 mg/dL (ref 8.9–10.3)
Chloride: 90 mmol/L — ABNORMAL LOW (ref 98–111)
Creatinine, Ser: 8.39 mg/dL — ABNORMAL HIGH (ref 0.61–1.24)
GFR, Estimated: 6 mL/min — ABNORMAL LOW (ref >60)
Glucose, Bld: 141 mg/dL — ABNORMAL HIGH (ref 70–99)
Phosphorus: 2.8 mg/dL (ref 2.5–4.6)
Potassium: 6.2 mmol/L — ABNORMAL HIGH (ref 3.5–5.1)
Sodium: 130 mmol/L — ABNORMAL LOW (ref 135–145)

## 2021-03-29 LAB — HEPATITIS B CORE ANTIBODY, TOTAL: Hep B Core Total Ab: NONREACTIVE

## 2021-03-29 LAB — HEPATITIS B SURFACE ANTIGEN: Hepatitis B Surface Ag: NONREACTIVE

## 2021-03-29 LAB — MAGNESIUM: Magnesium: 2.9 mg/dL — ABNORMAL HIGH (ref 1.7–2.4)

## 2021-03-29 LAB — GLUCOSE, CAPILLARY: Glucose-Capillary: 136 mg/dL — ABNORMAL HIGH (ref 70–99)

## 2021-03-29 MED ORDER — SODIUM ZIRCONIUM CYCLOSILICATE 10 G PO PACK
10.0000 g | PACK | Freq: Once | ORAL | Status: AC
  Administered 2021-03-29: 10 g via ORAL
  Filled 2021-03-29: qty 1

## 2021-03-29 MED ORDER — HEPARIN SODIUM (PORCINE) 1000 UNIT/ML IJ SOLN
INTRAMUSCULAR | Status: AC
  Administered 2021-03-29: 2200 [IU] via INTRAVENOUS_CENTRAL
  Filled 2021-03-29: qty 1

## 2021-03-29 MED ORDER — DOXERCALCIFEROL 4 MCG/2ML IV SOLN
INTRAVENOUS | Status: AC
  Administered 2021-03-29: 3 ug via INTRAVENOUS
  Filled 2021-03-29: qty 2

## 2021-03-29 MED ORDER — MIDODRINE HCL 5 MG PO TABS
ORAL_TABLET | ORAL | Status: AC
  Administered 2021-04-02: 10 mg via ORAL
  Filled 2021-03-29: qty 2

## 2021-03-29 NOTE — Progress Notes (Signed)
PROGRESS NOTE    Ryan Wells  [id number] DOB: 06/03/1956 DOA: 03/14/2021 PCP: Pcp, No    Brief Narrative:   Ryan Wells is a 65 y.o. male with medical history of end-stage renal disease on hemodialysis, essential hypertension, diabetes mellitus type 2, anemia of chronic kidney disease,  presented to the hospital with back and leg pain worsening for 2 weeks.  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  It appears during this hospitalization he had MRI imaging which noted bilateral L5-S1 facet osteoarthritis left greater than right with synovial cysts and periarticular soft tissue edema on the left which superimposed septic arthritis could not be excluded and anterior displacement of the cord with distortion at T6-T7.  He had completed 6 weeks of IV antibiotics with dialysis per ID notes from 8/2.  Patient was again seen in the emergency department on 7/25 for radicular pain and neurosurgery had recommended short course of steroids.On this admission, in the emergency department patient was seen to be afebrile with stable vital signs.  CT imaging of the head and orbits negative for any acute intracranial pathology, but noted left facial and periorbital soft tissue contusion.  Labs from 8/3 significant for WBC 15.1, hemoglobin 10, potassium 4.4, BUN 63, creatinine 8.5, anion gap 20, and high-sensitivity troponin 84->101.  Chest x-ray significant for cardiomegaly with vascular congestion, focal area with pulmonary nodularity of the right mid lung correlates with cavitating lesion and a small right-sided pleural effusion.  Patient was then admitted to the hospital for further evaluation and treatment.  During hospitalization, patient has been adjusted on  pain medication regimen.  Currently, awaiting for skilled nursing facility/CIR placement but being limited by ability to  sustain the course at hemodialysis unit on sitting.     Principal Problem:   Lumbar back pain Active Problems:   Subclinical hypothyroidism   Anemia due to chronic kidney disease   Diabetes mellitus type II, non insulin dependent (HCC)   ESRD on hemodialysis (HCC)   Pleural effusion   Elevated troponin   Leg pain   Leukocytosis   History of bacteremia  Lumbar/sacral osteomyelitis/discitis, L5-S1, Septic arthritis L5-S1, and on admission,psoas abscess s/p IR aspiration Patient presented with worsening lumbar back pain with radiation of pain.  History of MSSA bacteremia on 03/13/2021.  WBC was elevated at 15.1 on presentation.  WBC still persistently elevated.  MRI of the lumbosacral spine on 8/ 5/22 was notable for L5-S1 discitis with osteomyelitis with left psoas abscess.  IR drained 60 mL of purulent fluid from the left psoas region.  Neurosurgery and infectious disease followed the patient during hospitalization.  WBC has normalized.  Recent blood cultures negative.  Fluid culture from aspiration fluid with  Staph aureus sensitive to all antibiotics.  Continue pain management and IV cefazolin.  Plan to continue cefazolin with hemodialysis for 6 to 8 weeks.  Tentative end date of antibiotics 04/30/2021  Intractable back pain.  Improved today after addition of fentanyl patch.  Continue oxycodone Dilaudid Lidoderm patch, diclofenac patch.  On increased dose of Neurontin.    Left face contusion:  On CT scan.  No erythema.  Continue supportive care    Elevated troponin:  On presentation.  ACS ruled out.   ESRD on HD with hyperkalemia.:  Continue hemodialysis as per nephrology.  We will give 1 dose of Lokelma today.  Diabetes mellitus type 2:  Latest hemoglobin A1c  of 5.1 on 01/16/2021.  Continue to monitor closely.   History of MSSA bacteremia:  Has previously completed 6 weeks of antibiotic.  Had TEE with no evidence of endocarditis in the past.  On IV cefazolin at this time.  Plan to  continue IV antibiotic for 6 to 8 weeks as per ID recommendation.   Anemia chronic kidney disease:  Baseline hemoglobin of around 8.0.  Latest hemoglobin of 8.8.  We will continue to monitor as outpatient   Subclinical hypothyroidism:  T4 normal.  TSH elevated.  Will need outpatient follow-up and repeat testing in 4 to 6 weeks.  Weakness/deconditioning/gait disturbance: Physical therapy on board and recommending CIR on discharge.  Currently awaiting for disposition plan.  DVT prophylaxis: heparin injection 5,000 Units Start: 03/15/21 1400   CODE STATUS-full code  Family Communication:   None today.  Disposition Plan:   Level of care: Med-Surg  Status is: Inpatient  Remains inpatient appropriate because:  IV treatments appropriate due to intensity of illness or inability to take PO, and Inpatient level of care appropriate due to severity of illness, awaiting for placement.  Dispo: The patient is from: Home              Anticipated d/c is to: CIR               Patient currently is medically stable to d/c.   Difficult to place patient No  Consultants:  Neurosurgery Nephrology Infectious disease Interventional radiology  Procedures:  IR guided aspiration of the left psoas abscess on 03/19/2021 with removal of 60 mL purulent fluid.  Antimicrobials:  Ceftriaxone 8/4 - 8/6 Cefazolin 8/6>>  Subjective:  Today, patient was seen and examined at bedside.  Feels better with pain today.  Appears to be little better spirits.  Objective: Vitals:   03/29/21 0630 03/29/21 0636 03/29/21 0638 03/29/21 0742  BP: (!) 131/59 134/66 137/63 (!) 129/50  Pulse: 68 69 71 70  Resp: '14 14 14 16  '$ Temp:    99 F (37.2 C)  TempSrc:    Oral  SpO2:    100%  Weight:   62.2 kg   Height:        Intake/Output Summary (Last 24 hours) at 03/29/2021 1028 Last data filed at 03/29/2021 [id number] Gross per 24 hour  Intake --  Output 1200 ml  Net -1200 ml     Filed Weights   03/26/21 1540 03/29/21  0318 03/29/21 0638  Weight: 62 kg 63.4 kg 62.2 kg   Physical examination:  General:  Average built, not in obvious distress, alert awake and oriented HENT:   No scleral pallor or icterus noted. Oral mucosa is moist.  Chest:  Clear breath sounds.  Diminished breath sounds bilaterally. No crackles or wheezes.  CVS: S1 &S2 heard. No murmur.  Regular rate and rhythm. Abdomen: Soft, nontender, nondistended.  Bowel sounds are heard.   Extremities: No cyanosis, clubbing or edema.  Peripheral pulses are palpable.  Left upper extremity AV fistula. Psych: Alert, awake and oriented, normal mood CNS:  No cranial nerve deficits.  Power equal in all extremities.   Skin: Warm and dry.  No rashes noted.   Data Reviewed: I have personally reviewed the following labs and imaging studies   CBC: Recent Labs  Lab 03/23/21 0206 03/25/21 0306 03/27/21 0517 03/29/21 0113  WBC 6.7 6.9 8.5 15.4*  HGB 9.8* 9.9* 9.6* 8.8*  HCT 32.8* 32.5* 32.7* 29.5*  MCV 99.7 97.9 99.1 98.0  PLT 219 224 302  [id number]    Basic Metabolic Panel: Recent Labs  Lab 03/25/21 0306 03/26/21 0519 03/27/21 0517 03/28/21 0251 03/29/21 0113  NA 132* 132* 135 132* 130*  K 4.8 5.3* 4.8 5.0 6.2*  CL 92* 93* 96* 92* 90*  CO2 '28 27 27 28 30  '$ GLUCOSE 113* 129* 102* 155* 141*  BUN 46* 63* 31* 45* 57*  CREATININE 7.80* 9.61* 5.66* 6.94* 8.39*  CALCIUM 9.3 8.9 8.7* 9.0 9.4  MG  --   --  2.3  --  2.9*  PHOS 3.5 4.2 3.5 3.7 2.8    GFR: Estimated Creatinine Clearance: 6.7 mL/min (A) (by C-G formula based on SCr of 8.39 mg/dL (H)). Liver Function Tests: Recent Labs  Lab 03/25/21 0306 03/26/21 0519 03/27/21 0517 03/28/21 0251 03/29/21 0113  ALBUMIN 2.5* 2.4* 2.6* 2.5* 2.6*    No results for input(s): LIPASE, AMYLASE in the last 168 hours. No results for input(s): AMMONIA in the last 168 hours. Coagulation Profile: No results for input(s): INR, PROTIME in the last 168 hours.  Cardiac Enzymes: No results for input(s):  CKTOTAL, CKMB, CKMBINDEX, TROPONINI in the last 168 hours. BNP (last 3 results) No results for input(s): PROBNP in the last 8760 hours. HbA1C: No results for input(s): HGBA1C in the last 72 hours. CBG: Recent Labs  Lab 03/29/21 0250  GLUCAP 136*   Lipid Profile: No results for input(s): CHOL, HDL, LDLCALC, TRIG, CHOLHDL, LDLDIRECT in the last 72 hours. Thyroid Function Tests: No results for input(s): TSH, T4TOTAL, FREET4, T3FREE, THYROIDAB in the last 72 hours.  Anemia Panel: No results for input(s): VITAMINB12, FOLATE, FERRITIN, TIBC, IRON, RETICCTPCT in the last 72 hours. Sepsis Labs: No results for input(s): PROCALCITON, LATICACIDVEN in the last 168 hours.  Recent Results (from the past 240 hour(s))  Aerobic/Anaerobic Culture w Gram Stain (surgical/deep wound)     Status: None   Collection Time: 03/19/21  5:09 PM   Specimen: Abscess  Result Value Ref Range Status   Specimen Description ABSCESS  Final   Special Requests LEFT PSOAS  Final   Gram Stain   Final    ABUNDANT WBC PRESENT,BOTH PMN AND MONONUCLEAR FEW GRAM POSITIVE COCCI    Culture   Final    FEW STAPHYLOCOCCUS AUREUS NO ANAEROBES ISOLATED Performed at [facility], [address] [place], [place] [address]    Report Status 03/24/2021 FINAL  Final   Organism ID, Bacteria STAPHYLOCOCCUS AUREUS  Final      Susceptibility   Staphylococcus aureus - MIC*    CIPROFLOXACIN <=0.5 SENSITIVE Sensitive     ERYTHROMYCIN <=0.25 SENSITIVE Sensitive     GENTAMICIN <=0.5 SENSITIVE Sensitive     OXACILLIN 0.5 SENSITIVE Sensitive     TETRACYCLINE <=1 SENSITIVE Sensitive     VANCOMYCIN 1 SENSITIVE Sensitive     TRIMETH/SULFA <=10 SENSITIVE Sensitive     CLINDAMYCIN <=0.25 SENSITIVE Sensitive     RIFAMPIN <=0.5 SENSITIVE Sensitive     Inducible Clindamycin NEGATIVE Sensitive     * FEW STAPHYLOCOCCUS AUREUS      Radiology Studies: No results found.    Scheduled Meds:  Chlorhexidine Gluconate Cloth  6 each  Topical Q0600   darbepoetin (ARANESP) injection - DIALYSIS  100 mcg Intravenous Q Mon-HD   diclofenac  1 patch Transdermal BID   doxercalciferol  3 mcg Intravenous Q M,W,F-HD   feeding supplement (NEPRO CARB STEADY)  237 mL Oral BID BM   fentaNYL  1 patch Transdermal Q72H   gabapentin  100 mg  Oral TID   heparin  5,000 Units Subcutaneous Q8H   lidocaine  1 patch Transdermal Q24H   midodrine  10 mg Oral Q M,W,F-HD   multivitamin  1 tablet Oral QHS   polyethylene glycol  17 g Oral BID   senna-docusate  2 tablet Oral BID   sodium chloride flush  3 mL Intravenous Q12H   sucroferric oxyhydroxide  1,000 mg Oral TID WC   Continuous Infusions:   ceFAZolin (ANCEF) IV 1 g (03/28/21 2346)     LOS: 14 days    Flora Lipps, MD [facility] 03/29/2021, 10:28 AM

## 2021-03-29 NOTE — Plan of Care (Signed)

## 2021-03-29 NOTE — Progress Notes (Signed)
Physical Therapy Treatment Patient Details Name: Ryan Wells MRN: [id number] DOB: Jul 20, 1956 Today's Date: 03/29/2021    History of Present Illness Pt is 65 y.o. male presenting to ED on 8/3 with severe back/leg pain x2 weeks and diaphoresis.  Pt admitted with lumbar sacral osteomyelitis/discitis with septic arthritis L5-S1 and bil psoas abscess, R sided pleural effusion. . 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 At time of previous admission MRI (+) L5-S1 facet osteoarthritis, synovial cysts and periarticular soft tissue edema on the left. 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 Returned to ED 7/25 c/o radicular low back pain. 8/8 CT guided aspiration of left psoas abscess   PMHx significant for ESRD on HD, HTN, DMII, chornic anemia and MSSA.    PT Comments    Patient continues to be limited in participation due to pain (despite premedication). Increased time convincing pt to attempt to get OOB and stand/walk. Patient reporting cramps are worse today because he had dialysis earlier today. Ultimately pt sat on EOB and attempted to stand x2 with inability to clear buttocks off the bed.     Follow Up Recommendations  SNF (CIR denied pt due to inability to tolerate intensity)     Equipment Recommendations  Other (comment);Rolling walker with 5" wheels (further assessment at next venue as pain improves)    Recommendations for Other Services       Precautions / Restrictions Precautions Precautions: Fall Precaution Comments: Back pain - back precautions for comfort    Mobility  Bed Mobility Overal bed mobility: Needs Assistance Bed Mobility: Rolling;Sidelying to Sit;Sit to Sidelying Rolling: Supervision (with rail) Sidelying to sit: Mod assist     Sit to sidelying: Min assist General bed mobility comments: uses rail for rolling; assist to raise torso and later to lift legs to sidelying    Transfers                  General transfer comment: pt attempted to stand x1 and unable to clear buttocks off bed; returned to sidelying and then attempted a second time with same results  Ambulation/Gait                 Stairs             Wheelchair Mobility    Modified Rankin (Stroke Patients Only)       Balance Overall balance assessment: Needs assistance Sitting-balance support: Bilateral upper extremity supported;Feet supported Sitting balance-Leahy Scale: Fair Sitting balance - Comments:  (limited by pain; pushing toward side to offload hip at times)                                    Cognition Arousal/Alertness: Awake/alert Behavior During Therapy: Anxious Overall Cognitive Status: Within Functional Limits for tasks assessed                                 General Comments: most likely baseline - some distraction due to pain      Exercises      General Comments General comments (skin integrity, edema, etc.): Lengthy discusssion via interpreter re: need to move some each day in order to see progress/improvement. Required incr time to persuade pt to participate and incr time for interpreting      Pertinent Vitals/Pain Pain Assessment: 0-10 Faces Pain Scale:  Hurts worst Pain Location: rt leg, and back Pain Descriptors / Indicators: Discomfort;Grimacing;Guarding;Moaning;Sharp Pain Intervention(s): Limited activity within patient's tolerance;Monitored during session;Premedicated before session;Repositioned;Ice applied    Home Living                      Prior Function            PT Goals (current goals can now be found in the care plan section) Acute Rehab PT Goals Patient Stated Goal: to be able to do for myself PT Goal Formulation: With patient Time For Goal Achievement: 03/31/21 Potential to Achieve Goals: Fair Progress towards PT goals: Not progressing toward goals - comment    Frequency    Min  3X/week      PT Plan Discharge plan needs to be updated;Frequency needs to be updated    Co-evaluation              AM-PAC PT "6 Clicks" Mobility   Outcome Measure  Help needed turning from your back to your side while in a flat bed without using bedrails?: A Little Help needed moving from lying on your back to sitting on the side of a flat bed without using bedrails?: A Lot Help needed moving to and from a bed to a chair (including a wheelchair)?: Total Help needed standing up from a chair using your arms (e.g., wheelchair or bedside chair)?: Total Help needed to walk in hospital room?: Total Help needed climbing 3-5 steps with a railing? : Total 6 Click Score: 9    End of Session Equipment Utilized During Treatment: Gait belt Activity Tolerance: Patient limited by pain Patient left: with call bell/phone within reach;in bed Nurse Communication: Mobility status;Patient requests pain meds PT Visit Diagnosis: Difficulty in walking, not elsewhere classified (R26.2);Pain Pain - Right/Left:  (central back; bil LEs) Pain - part of body:  (back)     Time: [id number] PT Time Calculation (min) (ACUTE ONLY): 44 min  Charges:  $Therapeutic Activity: 23-37 mins $Self Care/Home Management: 8-22                      Arby Barrette, PT Pager [phone]    Rexanne Mano 03/29/2021, 4:23 PM

## 2021-03-29 NOTE — Progress Notes (Signed)
[facility] Progress Note   Subjective:     HD early this AM without incident.  Looks tired.  SNF recommended.    Objective Vitals:   03/29/21 0630 03/29/21 0636 03/29/21 0638 03/29/21 0742  BP: (!) 131/59 134/66 137/63 (!) 129/50  Pulse: 68 69 71 70  Resp: '14 14 14 16  '$ Temp:    99 F (37.2 C)  TempSrc:    Oral  SpO2:    100%  Weight:   62.2 kg   Height:       Physical Exam General: NAD Heart: RRR Lungs: clear bilaterally Abdomen:Soft, non-tender, (+) bowel sounds Extremities:No edema BLLE Dialysis Access: L forearm AVF (+) Bruit/Thrill   Filed Weights   03/26/21 1540 03/29/21 0318 03/29/21 0638  Weight: 62 kg 63.4 kg 62.2 kg    Intake/Output Summary (Last 24 hours) at 03/29/2021 1143 Last data filed at 03/29/2021 [id number] Gross per 24 hour  Intake --  Output 1200 ml  Net -1200 ml    Additional Objective Labs: Basic Metabolic Panel: Recent Labs  Lab 03/27/21 0517 03/28/21 0251 03/29/21 0113  NA 135 132* 130*  K 4.8 5.0 6.2*  CL 96* 92* 90*  CO2 '27 28 30  '$ GLUCOSE 102* 155* 141*  BUN 31* 45* 57*  CREATININE 5.66* 6.94* 8.39*  CALCIUM 8.7* 9.0 9.4  PHOS 3.5 3.7 2.8   Liver Function Tests: Recent Labs  Lab 03/27/21 0517 03/28/21 0251 03/29/21 0113  ALBUMIN 2.6* 2.5* 2.6*   No results for input(s): LIPASE, AMYLASE in the last 168 hours. CBC: Recent Labs  Lab 03/23/21 0206 03/25/21 0306 03/27/21 0517 03/29/21 0113  WBC 6.7 6.9 8.5 15.4*  HGB 9.8* 9.9* 9.6* 8.8*  HCT 32.8* 32.5* 32.7* 29.5*  MCV 99.7 97.9 99.1 98.0  PLT 219 224 302 347   Blood Culture    Component Value Date/Time   SDES ABSCESS 03/19/2021 1709   SPECREQUEST LEFT PSOAS 03/19/2021 1709   CULT  03/19/2021 1709    FEW STAPHYLOCOCCUS AUREUS NO ANAEROBES ISOLATED Performed at [facility], [place] [address], [place], [place] [address]    REPTSTATUS 03/24/2021 FINAL 03/19/2021 1709    Cardiac Enzymes: No results for input(s): CKTOTAL, CKMB, CKMBINDEX,  TROPONINI in the last 168 hours. CBG: Recent Labs  Lab 03/29/21 0250  GLUCAP 136*   Iron Studies: No results for input(s): IRON, TIBC, TRANSFERRIN, FERRITIN in the last 72 hours. Lab Results  Component Value Date   INR 1.1 03/19/2021   Studies/Results: No results found.  Medications:   ceFAZolin (ANCEF) IV 1 g (03/28/21 2346)    Chlorhexidine Gluconate Cloth  6 each Topical Q0600   darbepoetin (ARANESP) injection - DIALYSIS  100 mcg Intravenous Q Mon-HD   diclofenac  1 patch Transdermal BID   doxercalciferol  3 mcg Intravenous Q M,W,F-HD   feeding supplement (NEPRO CARB STEADY)  237 mL Oral BID BM   fentaNYL  1 patch Transdermal Q72H   gabapentin  100 mg Oral TID   heparin  5,000 Units Subcutaneous Q8H   lidocaine  1 patch Transdermal Q24H   midodrine  10 mg Oral Q M,W,F-HD   multivitamin  1 tablet Oral QHS   polyethylene glycol  17 g Oral BID   senna-docusate  2 tablet Oral BID   sodium chloride flush  3 mL Intravenous Q12H   sucroferric oxyhydroxide  1,000 mg Oral TID WC    Dialysis Orders: [facility] M[facility]  3h 49mn  180NRe 400/600  64kg  2/2 bath  P4  AVF   -Heparin  2200 units IV TIW -Mircera 150 mcg IV q 2 week (last dose 150 mcg IV 02/28/2021) -Venofer 100 mg IV X 10 doses (4/6 doses given. 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  Neurosurgery evaluated and no intervention deemed necessary.  Seen by  ID input, started cefazolin (as likely MSSA given h/o MSSA bacteremia recently).  IR did CT guided aspiration of 60 cc on 8/8, site to small for drain placement per IR.  Per ID, Continue cefazolin with HD, plan for 6 to 8 weeks course. Tentative end date is 04/30/21. ESRD - Back on MWF schedule. Previously, he is not able to sit up in recliner without significant discomfort and he says that his back pain is still too bad to sit up for next rx.  He has been exercising in his room lately. He will need rehab before he can be discharged to home for outpatient dialysis.  Hypertension/volume  - As noted above. BP well controlled. Continue midodrine 10 mg PO TIW 30 prior to HD. May repeat mid HD if necessary. No evidence of overt volume overload by exam on admit and currently. Now very much under EDW. Will need EDW reset on discharge. UF as tolerated.   Anemia  - HGB 9.9.  Missed OP ESA 08/03. Last ESA dose given 03/22/21. Hold Fe load in setting of infection.  Metabolic bone disease - Continue velphoro 500 mg 2 tabs PO TID AC. Continue VDRA. Labs at goal.  Nutrition - Renal carb mod diet, nepro, renal vits. DM-No meds on OP list. Last HA1c 6.9. per primary Disposition - SNF recommended.   Madelon Lips MD [facility] [phone] 03/29/2021,11:43 AM  LOS: 14 days

## 2021-03-30 DIAGNOSIS — E119 Type 2 diabetes mellitus without complications: Secondary | ICD-10-CM | POA: Diagnosis not present

## 2021-03-30 DIAGNOSIS — R778 Other specified abnormalities of plasma proteins: Secondary | ICD-10-CM | POA: Diagnosis not present

## 2021-03-30 DIAGNOSIS — N186 End stage renal disease: Secondary | ICD-10-CM | POA: Diagnosis not present

## 2021-03-30 DIAGNOSIS — M545 Low back pain, unspecified: Secondary | ICD-10-CM | POA: Diagnosis not present

## 2021-03-30 LAB — RENAL FUNCTION PANEL
Albumin: 2.4 g/dL — ABNORMAL LOW (ref 3.5–5.0)
Anion gap: 11 (ref 5–15)
BUN: 30 mg/dL — ABNORMAL HIGH (ref 8–23)
CO2: 30 mmol/L (ref 22–32)
Calcium: 9 mg/dL (ref 8.9–10.3)
Chloride: 91 mmol/L — ABNORMAL LOW (ref 98–111)
Creatinine, Ser: 5.31 mg/dL — ABNORMAL HIGH (ref 0.61–1.24)
GFR, Estimated: 11 mL/min — ABNORMAL LOW (ref >60)
Glucose, Bld: 109 mg/dL — ABNORMAL HIGH (ref 70–99)
Phosphorus: 3 mg/dL (ref 2.5–4.6)
Potassium: 4.2 mmol/L (ref 3.5–5.1)
Sodium: 132 mmol/L — ABNORMAL LOW (ref 135–145)

## 2021-03-30 LAB — CBC
HCT: 29.8 % — ABNORMAL LOW (ref 39.0–52.0)
Hemoglobin: 9 g/dL — ABNORMAL LOW (ref 13.0–17.0)
MCH: 30 pg (ref 26.0–34.0)
MCHC: 30.2 g/dL (ref 30.0–36.0)
MCV: 99.3 fL (ref 80.0–100.0)
Platelets: 361 10*3/uL (ref 150–400)
RBC: 3 MIL/uL — ABNORMAL LOW (ref 4.22–5.81)
RDW: 16.5 % — ABNORMAL HIGH (ref 11.5–15.5)
WBC: 9 10*3/uL (ref 4.0–10.5)
nRBC: 0 % (ref 0.0–0.2)

## 2021-03-30 LAB — HEPATITIS B SURFACE ANTIBODY, QUANTITATIVE: Hep B S AB Quant (Post): 34 m[IU]/mL (ref >9.9)

## 2021-03-30 NOTE — Progress Notes (Signed)
Inpatient Rehabilitation Admissions Coordinator   We will sign off as patient unable to tolerate the intensity required of a CIR admit. Previously discussed with SW, Marya Amsler. Other rehab venue options need to be pursued.  Danne Baxter, RN, MSN Rehab Admissions Coordinator [phone] 03/30/2021 8:17 AM

## 2021-03-30 NOTE — Progress Notes (Signed)
PROGRESS NOTE    Ryan Wells  [id number] DOB: 07-Jan-1956 DOA: 03/14/2021 PCP: Pcp, No    Brief Narrative:   Ryan Wells is a 65 y.o. male with medical history of end-stage renal disease on hemodialysis, essential hypertension, diabetes mellitus type 2, anemia of chronic kidney disease,  presented to the hospital with back and leg pain worsening for 2 weeks.  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  It appears during this hospitalization he had MRI imaging which noted bilateral L5-S1 facet osteoarthritis left greater than right with synovial cysts and periarticular soft tissue edema on the left which superimposed septic arthritis could not be excluded and anterior displacement of the cord with distortion at T6-T7.  He had completed 6 weeks of IV antibiotics with dialysis per ID notes from 8/2.  Patient was again seen in the emergency department on 7/25 for radicular pain and neurosurgery had recommended short course of steroids.On this admission, in the emergency department patient was seen to be afebrile with stable vital signs.  CT imaging of the head and orbits negative for any acute intracranial pathology, but noted left facial and periorbital soft tissue contusion.  Labs from 8/3 significant for WBC 15.1, hemoglobin 10, potassium 4.4, BUN 63, creatinine 8.5, anion gap 20, and high-sensitivity troponin 84->101.  Chest x-ray significant for cardiomegaly with vascular congestion, focal area with pulmonary nodularity of the right mid lung correlates with cavitating lesion and a small right-sided pleural effusion.  Patient was then admitted to the hospital for further evaluation and treatment.  During hospitalization, patient has been adjusted on  pain medication regimen.  Currently, awaiting for skilled nursing facility/CIR placement but being limited by ability to  sustain the course at hemodialysis unit on sitting.     Principal Problem:   Lumbar back pain Active Problems:   Subclinical hypothyroidism   Anemia due to chronic kidney disease   Diabetes mellitus type II, non insulin dependent (HCC)   ESRD on hemodialysis (HCC)   Pleural effusion   Elevated troponin   Leg pain   Leukocytosis   History of bacteremia  Lumbar/sacral osteomyelitis/discitis, L5-S1, Septic arthritis L5-S1, and on admission,psoas abscess s/p IR aspiration Patient presented with worsening lumbar back pain with radiation of pain.  History of MSSA bacteremia on 03/13/2021.  WBC was elevated at 15.1 on presentation.  WBC has normalized at this time.  MRI of the lumbosacral spine on 8/ 5/22 was notable for L5-S1 discitis with osteomyelitis with left psoas abscess.  IR drained 60 mL of purulent fluid from the left psoas region.  Neurosurgery and infectious disease followed the patient during hospitalization.  WBC has normalized.  Recent blood cultures negative.  Fluid culture from aspiration fluid with  Staph aureus sensitive to all antibiotics.  Continue pain management and IV cefazolin.  Plan to continue cefazolin with hemodialysis for 6 to 8 weeks.  Tentative end date of antibiotics 04/30/2021  Intractable back pain.  Gradually improving after addition of entanyl patch.  Continue oxycodone Dilaudid Lidoderm patch, diclofenac patch.  On increased dose of Neurontin.    Left face contusion:  On CT scan.  No erythema.  Continue supportive care    Elevated troponin:  On presentation.  ACS ruled out.   ESRD on HD with hyperkalemia.:  Continue hemodialysis as per nephrology.  We will give 1 dose of Lokelma today.  Diabetes mellitus type 2:  Latest  hemoglobin A1c of 5.1 on 01/16/2021.  Continue to monitor closely.   History of MSSA bacteremia:  Has previously completed 6 weeks of antibiotic.  Had TEE with no evidence of endocarditis in the past.  On IV cefazolin at this time.  Plan to  continue IV antibiotic for 6 to 8 weeks as per ID recommendation.   Anemia chronic kidney disease:  Baseline hemoglobin of around 8.0.  Latest hemoglobin of 9.0 we will continue to monitor as outpatient   Subclinical hypothyroidism:  T4 normal.  TSH elevated.  Will need outpatient follow-up and repeat testing in 4 to 6 weeks.  Weakness/deconditioning/gait disturbance: Physical therapy on board and recommending CIR on discharge.  Currently awaiting for disposition plan.  DVT prophylaxis: heparin injection 5,000 Units Start: 03/15/21 1400   CODE STATUS-full code  Family Communication:   None today.  Disposition Plan:   Level of care: Med-Surg  Status is: Inpatient  Remains inpatient appropriate because:  IV treatments appropriate due to intensity of illness or inability to take PO, and Inpatient level of care appropriate due to severity of illness, awaiting for placement.  Dispo: The patient is from: Home              Anticipated d/c is to: CIR versus skilled nursing facility.              Patient currently is medically stable to d/c.   Difficult to place patient No  Consultants:  Neurosurgery Nephrology Infectious disease Interventional radiology  Procedures:  IR guided aspiration of the left psoas abscess on 03/19/2021 with removal of 60 mL purulent fluid. Hemodialysis.  Antimicrobials:  Ceftriaxone 8/4 - 8/6 Cefazolin 8/6>>  Subjective:   Patient was seen and examined at bedside.  Overall his pain control is much better.  Has been having bowel movements.  Objective: Vitals:   03/29/21 1217 03/29/21 2039 03/30/21 0521 03/30/21 0600  BP: (!) 144/61 128/67 (!) 106/53   Pulse: 68 65 63   Resp: '20 18 18   '$ Temp: 98.6 F (37 C) 98.2 F (36.8 C) 98.4 F (36.9 C)   TempSrc:      SpO2: 100% 100% 100%   Weight:    62.6 kg  Height:        Intake/Output Summary (Last 24 hours) at 03/30/2021 1150 Last data filed at 03/29/2021 2300 Gross per 24 hour  Intake 150 ml   Output --  Net 150 ml     Filed Weights   03/29/21 0318 03/29/21 0638 03/30/21 0600  Weight: 63.4 kg 62.2 kg 62.6 kg   Physical examination:  General:  Average built, not in obvious distress, alert awake and oriented HENT:   No scleral pallor or icterus noted. Oral mucosa is moist.  Chest:  Clear breath sounds.  Diminished breath sounds bilaterally. No crackles or wheezes.  CVS: S1 &S2 heard. No murmur.  Regular rate and rhythm. Abdomen: Soft, nontender, nondistended.  Bowel sounds are heard.   Extremities: No cyanosis, clubbing or edema.  Peripheral pulses are palpable.  Left upper extremity AV fistula. Psych: Alert, awake and oriented, normal mood CNS:  No cranial nerve deficits.  Power equal in all extremities.   Skin: Warm and dry.  No rashes noted.   Data Reviewed: I have personally reviewed the following labs and imaging studies   CBC: Recent Labs  Lab 03/25/21 0306 03/27/21 0517 03/29/21 0113 03/30/21 0214  WBC 6.9 8.5 15.4* 9.0  HGB 9.9* 9.6* 8.8* 9.0*  HCT 32.5* 32.7* 29.5*  29.8*  MCV 97.9 99.1 98.0 99.3  PLT 224 302 347 [id number]    Basic Metabolic Panel: Recent Labs  Lab 03/26/21 0519 03/27/21 0517 03/28/21 0251 03/29/21 0113 03/30/21 0214  NA 132* 135 132* 130* 132*  K 5.3* 4.8 5.0 6.2* 4.2  CL 93* 96* 92* 90* 91*  CO2 '27 27 28 30 30  '$ GLUCOSE 129* 102* 155* 141* 109*  BUN 63* 31* 45* 57* 30*  CREATININE 9.61* 5.66* 6.94* 8.39* 5.31*  CALCIUM 8.9 8.7* 9.0 9.4 9.0  MG  --  2.3  --  2.9*  --   PHOS 4.2 3.5 3.7 2.8 3.0    GFR: Estimated Creatinine Clearance: 10.6 mL/min (A) (by C-G formula based on SCr of 5.31 mg/dL (H)). Liver Function Tests: Recent Labs  Lab 03/26/21 0519 03/27/21 0517 03/28/21 0251 03/29/21 0113 03/30/21 0214  ALBUMIN 2.4* 2.6* 2.5* 2.6* 2.4*    No results for input(s): LIPASE, AMYLASE in the last 168 hours. No results for input(s): AMMONIA in the last 168 hours. Coagulation Profile: No results for input(s): INR,  PROTIME in the last 168 hours.  Cardiac Enzymes: No results for input(s): CKTOTAL, CKMB, CKMBINDEX, TROPONINI in the last 168 hours. BNP (last 3 results) No results for input(s): PROBNP in the last 8760 hours. HbA1C: No results for input(s): HGBA1C in the last 72 hours. CBG: Recent Labs  Lab 03/29/21 0250  GLUCAP 136*    Lipid Profile: No results for input(s): CHOL, HDL, LDLCALC, TRIG, CHOLHDL, LDLDIRECT in the last 72 hours. Thyroid Function Tests: No results for input(s): TSH, T4TOTAL, FREET4, T3FREE, THYROIDAB in the last 72 hours.  Anemia Panel: No results for input(s): VITAMINB12, FOLATE, FERRITIN, TIBC, IRON, RETICCTPCT in the last 72 hours. Sepsis Labs: No results for input(s): PROCALCITON, LATICACIDVEN in the last 168 hours.  No results found for this or any previous visit (from the past 240 hour(s)).     Radiology Studies: No results found.    Scheduled Meds:  Chlorhexidine Gluconate Cloth  6 each Topical Q0600   darbepoetin (ARANESP) injection - DIALYSIS  100 mcg Intravenous Q Mon-HD   diclofenac  1 patch Transdermal BID   doxercalciferol  3 mcg Intravenous Q M,W,F-HD   feeding supplement (NEPRO CARB STEADY)  237 mL Oral BID BM   fentaNYL  1 patch Transdermal Q72H   gabapentin  100 mg Oral TID   heparin  5,000 Units Subcutaneous Q8H   lidocaine  1 patch Transdermal Q24H   midodrine  10 mg Oral Q M,W,F-HD   multivitamin  1 tablet Oral QHS   polyethylene glycol  17 g Oral BID   senna-docusate  2 tablet Oral BID   sodium chloride flush  3 mL Intravenous Q12H   sucroferric oxyhydroxide  1,000 mg Oral TID WC   Continuous Infusions:   ceFAZolin (ANCEF) IV 1 g (03/29/21 2136)     LOS: 15 days    Flora Lipps, MD [facility] 03/30/2021, 11:50 AM

## 2021-03-30 NOTE — Progress Notes (Signed)
[facility] Progress Note   Subjective:     No overall change.  SNF being recommended.  Objective Vitals:   03/29/21 1217 03/29/21 2039 03/30/21 0521 03/30/21 0600  BP: (!) 144/61 128/67 (!) 106/53   Pulse: 68 65 63   Resp: '20 18 18   '$ Temp: 98.6 F (37 C) 98.2 F (36.8 C) 98.4 F (36.9 C)   TempSrc:      SpO2: 100% 100% 100%   Weight:    62.6 kg  Height:       Physical Exam General: NAD Heart: RRR Lungs: clear bilaterally Abdomen:Soft, non-tender, (+) bowel sounds Extremities:No edema BLLE Dialysis Access: L forearm AVF (+) Bruit/Thrill   Filed Weights   03/29/21 0318 03/29/21 0638 03/30/21 0600  Weight: 63.4 kg 62.2 kg 62.6 kg    Intake/Output Summary (Last 24 hours) at 03/30/2021 1056 Last data filed at 03/29/2021 2300 Gross per 24 hour  Intake 150 ml  Output --  Net 150 ml    Additional Objective Labs: Basic Metabolic Panel: Recent Labs  Lab 03/28/21 0251 03/29/21 0113 03/30/21 0214  NA 132* 130* 132*  K 5.0 6.2* 4.2  CL 92* 90* 91*  CO2 '28 30 30  '$ GLUCOSE 155* 141* 109*  BUN 45* 57* 30*  CREATININE 6.94* 8.39* 5.31*  CALCIUM 9.0 9.4 9.0  PHOS 3.7 2.8 3.0   Liver Function Tests: Recent Labs  Lab 03/28/21 0251 03/29/21 0113 03/30/21 0214  ALBUMIN 2.5* 2.6* 2.4*   No results for input(s): LIPASE, AMYLASE in the last 168 hours. CBC: Recent Labs  Lab 03/25/21 0306 03/27/21 0517 03/29/21 0113 03/30/21 0214  WBC 6.9 8.5 15.4* 9.0  HGB 9.9* 9.6* 8.8* 9.0*  HCT 32.5* 32.7* 29.5* 29.8*  MCV 97.9 99.1 98.0 99.3  PLT 224 302 347 361   Blood Culture    Component Value Date/Time   SDES ABSCESS 03/19/2021 1709   SPECREQUEST LEFT PSOAS 03/19/2021 1709   CULT  03/19/2021 1709    FEW STAPHYLOCOCCUS AUREUS NO ANAEROBES ISOLATED Performed at [facility], [place] [address], [place], [place] [address]    REPTSTATUS 03/24/2021 FINAL 03/19/2021 1709    Cardiac Enzymes: No results for input(s): CKTOTAL, CKMB, CKMBINDEX,  TROPONINI in the last 168 hours. CBG: Recent Labs  Lab 03/29/21 0250  GLUCAP 136*   Iron Studies: No results for input(s): IRON, TIBC, TRANSFERRIN, FERRITIN in the last 72 hours. Lab Results  Component Value Date   INR 1.1 03/19/2021   Studies/Results: No results found.  Medications:   ceFAZolin (ANCEF) IV 1 g (03/29/21 2136)    Chlorhexidine Gluconate Cloth  6 each Topical Q0600   darbepoetin (ARANESP) injection - DIALYSIS  100 mcg Intravenous Q Mon-HD   diclofenac  1 patch Transdermal BID   doxercalciferol  3 mcg Intravenous Q M,W,F-HD   feeding supplement (NEPRO CARB STEADY)  237 mL Oral BID BM   fentaNYL  1 patch Transdermal Q72H   gabapentin  100 mg Oral TID   heparin  5,000 Units Subcutaneous Q8H   lidocaine  1 patch Transdermal Q24H   midodrine  10 mg Oral Q M,W,F-HD   multivitamin  1 tablet Oral QHS   polyethylene glycol  17 g Oral BID   senna-docusate  2 tablet Oral BID   sodium chloride flush  3 mL Intravenous Q12H   sucroferric oxyhydroxide  1,000 mg Oral TID WC    Dialysis Orders: [facility] M[facility]  3h 29mn  180NRe 400/600  64kg  2/2 bath  P4  AVF   -Heparin  2200 units IV TIW -Mircera 150 mcg IV q 2 week (last dose 150 mcg IV 02/28/2021) -Venofer 100 mg IV X 10 doses (4/6 doses given. 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  Neurosurgery evaluated and no intervention deemed necessary.  Seen by  ID input, started cefazolin (as likely MSSA given h/o MSSA bacteremia recently).  IR did CT guided aspiration of 60 cc on 8/8, site to small for drain placement per IR.  Per ID, Continue cefazolin with HD, plan for 6 to 8 weeks course. Tentative end date is 04/30/21. ESRD - Back on MWF schedule. Previously, he is not able to sit up in recliner without significant discomfort and he says that his back pain is still too bad to sit up for next rx.  He has been exercising in his room lately. He will need rehab before he can be discharged to home for outpatient dialysis.  Hypertension/volume  - As noted above. BP well controlled. Continue midodrine 10 mg PO TIW 30 prior to HD. May repeat mid HD if necessary. No evidence of overt volume overload by exam on admit and currently. Now very much under EDW. Will need EDW reset on discharge. UF as tolerated.   Anemia  - HGB 9.9.  Missed OP ESA 08/03. On aranesp 100 mcg q Monday here. Metabolic bone disease - Continue velphoro 500 mg 2 tabs PO TID AC. Continue VDRA. Labs at goal.  Nutrition - Renal carb mod diet, nepro, renal vits. DM-No meds on OP list. Last HA1c 6.9. per primary Disposition - SNF recommended.   Madelon Lips MD [place] [facility] [phone] 03/30/2021,10:56 AM  LOS: 15 days

## 2021-03-30 NOTE — Progress Notes (Signed)
Occupational Therapy Treatment Patient Details Name: Ryan Wells MRN: [id number] DOB: Apr 27, 1956 Today's Date: 03/30/2021    History of present illness Pt is 65 y.o. male presenting to ED on 8/3 with severe back/leg pain x2 weeks and diaphoresis.  Pt admitted with lumbar sacral osteomyelitis/discitis with septic arthritis L5-S1 and bil psoas abscess, R sided pleural effusion. . 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 At time of previous admission MRI (+) L5-S1 facet osteoarthritis, synovial cysts and periarticular soft tissue edema on the left. 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 Returned to ED 7/25 c/o radicular low back pain. 8/8 CT guided aspiration of left psoas abscess   PMHx significant for ESRD on HD, HTN, DMII, chornic anemia and MSSA.   OT comments  Pt slowly progressing toward established goals, however, still limited with pain and functional mobility. Pt received awake and alert, agreeable to EOB ADL task. Pt required min A to mod A bed mobility, rolling to sit ( mod A trunk elevation), sit to rolling ( min A LE management). Min guard to minA sitting EOB for grooming tasks due to requiring support in sitting. Pt educated to continue HEP and to increased mobility with therapy to improve function and strength prior to dc. DC recommendation and frequency remains the same. OT will continue to follow.    Follow Up Recommendations  Supervision/Assistance - 24 hour;CIR;Other (comment)    Equipment Recommendations  3 in 1 bedside commode    Recommendations for Other Services Rehab consult    Precautions / Restrictions Precautions Precautions: Fall Precaution Comments: Back pain - back precautions for comfort (pt unable to state back precautions, however, able to initiate log roll toward R side of bed.)       Mobility Bed Mobility Overal bed mobility: Needs Assistance Bed Mobility: Rolling;Sidelying to  Sit;Sit to Sidelying Rolling: Supervision (with rail) Sidelying to sit: Mod assist Supine to sit: Mod assist   Sit to sidelying: Min assist General bed mobility comments: uses rail for rolling; assist to raise torso and later to lift legs to sidelying    Transfers Overall transfer level: Needs assistance               General transfer comment: Pt declined standing EOB but agreeable to sit EOB for ADL tasks.    Balance Overall balance assessment: Needs assistance Sitting-balance support: Bilateral upper extremity supported;Feet supported Sitting balance-Leahy Scale: Fair Sitting balance - Comments:  (limited by pain; pushing toward side to offload hip at times)   Standing balance support: Bilateral upper extremity supported Standing balance-Leahy Scale: Poor Standing balance comment: Requiring walker and assist of 2                           ADL either performed or assessed with clinical judgement   ADL Overall ADL's : Needs assistance/impaired     Grooming: Minimal assistance;Brushing hair;Oral care;Min guard;Sitting Grooming Details (indicate cue type and reason): Min A for support in sitting, pt observed with R lateral lean. report increased pain with brushing hair, limited sitting tolerance to perform oral care. Pt required min guard to min A sitting balance for oral care.                               General ADL Comments: Pt state EOB to complete ADLs of brushing hair and oral care, pt  limited with completion of brushing hair due to increased pain. Performed oral care, however, required support in sitting and reminders to bend forward. Pt reports relief, however, educated to continue to sitting up due to back precautions.     Vision       Perception     Praxis      Cognition Arousal/Alertness: Awake/alert Behavior During Therapy: Anxious Overall Cognitive Status: Within Functional Limits for tasks assessed                                  General Comments: most likely baseline - some distraction due to pain        Exercises General Exercises - Lower Extremity Straight Leg Raises:  (educated NOT to do this exercise)   Shoulder Instructions       General Comments Pt reports seeing the benefit of doing UE/LE exercises, reports feeling a bit stronger however, still limited with pain and weakness.    Pertinent Vitals/ Pain       Pain Assessment: 0-10 Faces Pain Scale: Hurts whole lot Pain Location: rt leg, and back Pain Descriptors / Indicators: Discomfort;Grimacing;Guarding;Moaning;Sharp Pain Intervention(s): Limited activity within patient's tolerance;Repositioned;Monitored during session;Premedicated before session  Home Living                                          Prior Functioning/Environment              Frequency  Min 2X/week        Progress Toward Goals  OT Goals(current goals can now be found in the care plan section)  Progress towards OT goals: Progressing toward goals  Acute Rehab OT Goals Patient Stated Goal: to be able to do for myself OT Goal Formulation: With patient Time For Goal Achievement: 03/31/21 Potential to Achieve Goals: Good ADL Goals Pt Will Perform Upper Body Dressing: with supervision;sitting Pt Will Perform Lower Body Dressing: with supervision;sit to/from stand;with adaptive equipment Pt Will Transfer to Toilet: with supervision;bedside commode Pt Will Perform Toileting - Clothing Manipulation and hygiene: with supervision;sit to/from stand  Plan Discharge plan remains appropriate    Co-evaluation                 AM-PAC OT "6 Clicks" Daily Activity     Outcome Measure   Help from another person eating meals?: None Help from another person taking care of personal grooming?: A Little Help from another person toileting, which includes using toliet, bedpan, or urinal?: A Lot Help from another person bathing (including  washing, rinsing, drying)?: A Lot Help from another person to put on and taking off regular upper body clothing?: A Little Help from another person to put on and taking off regular lower body clothing?: A Lot 6 Click Score: 16    End of Session    OT Visit Diagnosis: Unsteadiness on feet (R26.81);Repeated falls (R29.6);Muscle weakness (generalized) (M62.81);Pain Pain - Right/Left: Left Pain - part of body: Leg   Activity Tolerance Patient limited by pain   Patient Left in bed;with call bell/phone within reach;with bed alarm set   Nurse Communication Other (comment)        [id number] OT Time Calculation (min): 20 min  Charges: OT General Charges $OT Visit: 1 Visit OT Treatments $Self Care/Home Management : 8-22 mins  Minus Breeding, MSOT, OTR/L  Supplemental Rehabilitation Services  [phone]    Marius Ditch 03/30/2021, 4:14 PM

## 2021-03-31 DIAGNOSIS — N186 End stage renal disease: Secondary | ICD-10-CM | POA: Diagnosis not present

## 2021-03-31 DIAGNOSIS — M545 Low back pain, unspecified: Secondary | ICD-10-CM | POA: Diagnosis not present

## 2021-03-31 DIAGNOSIS — Z87898 Personal history of other specified conditions: Secondary | ICD-10-CM | POA: Diagnosis not present

## 2021-03-31 DIAGNOSIS — E119 Type 2 diabetes mellitus without complications: Secondary | ICD-10-CM | POA: Diagnosis not present

## 2021-03-31 LAB — RENAL FUNCTION PANEL
Albumin: 2.5 g/dL — ABNORMAL LOW (ref 3.5–5.0)
Anion gap: 10 (ref 5–15)
BUN: 43 mg/dL — ABNORMAL HIGH (ref 8–23)
CO2: 30 mmol/L (ref 22–32)
Calcium: 9.4 mg/dL (ref 8.9–10.3)
Chloride: 90 mmol/L — ABNORMAL LOW (ref 98–111)
Creatinine, Ser: 6.75 mg/dL — ABNORMAL HIGH (ref 0.61–1.24)
GFR, Estimated: 8 mL/min — ABNORMAL LOW (ref >60)
Glucose, Bld: 137 mg/dL — ABNORMAL HIGH (ref 70–99)
Phosphorus: 3.1 mg/dL (ref 2.5–4.6)
Potassium: 5 mmol/L (ref 3.5–5.1)
Sodium: 130 mmol/L — ABNORMAL LOW (ref 135–145)

## 2021-03-31 MED ORDER — MIDODRINE HCL 5 MG PO TABS
ORAL_TABLET | ORAL | Status: AC
  Administered 2021-03-31: 10 mg via ORAL
  Filled 2021-03-31: qty 2

## 2021-03-31 NOTE — Plan of Care (Signed)

## 2021-03-31 NOTE — Progress Notes (Signed)
Physical Therapy Treatment Patient Details Name: Ryan Wells MRN: [id number] DOB: 1956/05/22 Today's Date: 03/31/2021    History of Present Illness Pt is 65 y.o. male presenting to ED on 8/3 with severe back/leg pain x2 weeks and diaphoresis.  Pt admitted with lumbar sacral osteomyelitis/discitis with septic arthritis L5-S1 and bil psoas abscess, R sided pleural effusion. . 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 At time of previous admission MRI (+) L5-S1 facet osteoarthritis, synovial cysts and periarticular soft tissue edema on the left. 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 Returned to ED 7/25 c/o radicular low back pain. 8/8 CT guided aspiration of left psoas abscess   PMHx significant for ESRD on HD, HTN, DMII, chornic anemia and MSSA.    PT Comments    Pt supine in bed on arrival.  He is in pain and required encouragement to participate in PT session this pm.  Performed exercises in bed followed by attempt at sit to standing which presented in flexed posture of knees and hips.  Unable to achieve full standing.  May need to tray sit to stand lift vs. Inquire about need for back brace to better manage pain.      Follow Up Recommendations  SNF     Equipment Recommendations  Rolling walker with 5" wheels    Recommendations for Other Services Rehab consult     Precautions / Restrictions Precautions Precautions: Fall Precaution Comments: Back pain - back precautions for comfort    Mobility  Bed Mobility Overal bed mobility: Needs Assistance Bed Mobility: Rolling;Sidelying to Sit;Sit to Sidelying Rolling: Mod assist Sidelying to sit: Mod assist       General bed mobility comments: Pt rolled to the L but experiencing more pain in R thigh this direction.  Performed rolling to the R side of bed and he tolerated this much better.  He required assistance to move LEs to edge of bed and elevate trunk into  a seated position.    Transfers Overall transfer level: Needs assistance Equipment used: Rolling walker (2 wheeled) Transfers: Sit to/from Stand Sit to Stand: Mod assist;+2 physical assistance         General transfer comment: Pt performed flexed position clearing bottom from bed to wipe his bottom before back to bed.  Ambulation/Gait Ambulation/Gait assistance:  (unable.)               Stairs             Wheelchair Mobility    Modified Rankin (Stroke Patients Only)       Balance Overall balance assessment: Needs assistance Sitting-balance support: Bilateral upper extremity supported;Feet supported Sitting balance-Leahy Scale: Fair       Standing balance-Leahy Scale: Poor                              Cognition Arousal/Alertness: Awake/alert Behavior During Therapy: Anxious Overall Cognitive Status: Within Functional Limits for tasks assessed                                 General Comments: most likely baseline - some distraction due to pain      Exercises General Exercises - Lower Extremity Ankle Circles/Pumps: AROM;Both;10 reps;Supine Quad Sets: AROM;Both;10 reps;Supine Short Arc Quad: AROM Heel Slides: AROM;Both;10 reps;Supine;AAROM Hip ABduction/ADduction: AROM;AAROM;Both;10 reps;Supine    General Comments  Pertinent Vitals/Pain Pain Assessment: 0-10 Pain Score: 9  Pain Location: Reports 9/10 in back and 7/10 in R thigh/quad.  More painful rolling to L side of bed. Pain Descriptors / Indicators: Discomfort;Grimacing;Guarding;Moaning;Sharp Pain Intervention(s): Monitored during session;Repositioned;Heat applied    Home Living                      Prior Function            PT Goals (current goals can now be found in the care plan section) Acute Rehab PT Goals Patient Stated Goal: to be able to do for myself Potential to Achieve Goals: Fair Progress towards PT goals: Progressing toward  goals    Frequency    Min 3X/week      PT Plan Current plan remains appropriate    Co-evaluation              AM-PAC PT "6 Clicks" Mobility   Outcome Measure  Help needed turning from your back to your side while in a flat bed without using bedrails?: A Lot Help needed moving from lying on your back to sitting on the side of a flat bed without using bedrails?: A Lot Help needed moving to and from a bed to a chair (including a wheelchair)?: A Lot Help needed standing up from a chair using your arms (e.g., wheelchair or bedside chair)?: A Lot Help needed to walk in hospital room?: Total Help needed climbing 3-5 steps with a railing? : Total 6 Click Score: 10    End of Session Equipment Utilized During Treatment: Gait belt Activity Tolerance: Patient limited by pain Patient left: with call bell/phone within reach;in bed;with bed alarm set Nurse Communication: Mobility status;Patient requests pain meds (left sitting on bed pan) PT Visit Diagnosis: Difficulty in walking, not elsewhere classified (R26.2);Pain     Time: [id number] PT Time Calculation (min) (ACUTE ONLY): 25 min  Charges:  $Therapeutic Exercise: 8-22 mins $Therapeutic Activity: 8-22 mins                     Erasmo Leventhal , PTA Acute Rehabilitation Services Pager [phone] Office [phone]    Irish Breisch Eli Hose 03/31/2021, 3:03 PM

## 2021-03-31 NOTE — Progress Notes (Addendum)
PROGRESS NOTE    Ryan Wells  [id number] DOB: Sep 14, 1955 DOA: 03/14/2021 PCP: Pcp, No    Brief Narrative:   Ryan Wells is a 65 y.o. male with medical history of end-stage renal disease on hemodialysis, essential hypertension, diabetes mellitus type 2, anemia of chronic kidney disease,  presented to the hospital with back and leg pain worsening for 2 weeks.  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  He had completed 6 weeks of IV antibiotics with dialysis per ID notes from 8/2.  Patient continued to have worsening symptoms so he came back to the ED.  On this admission, in the emergency department patient was seen to be afebrile with stable vital signs.  Laboratory data showed leukocytosis.  Chest x-ray significant for cardiomegaly with vascular congestion, focal area with pulmonary nodularity of the right mid lung correlates with cavitating lesion and a small right-sided pleural effusion.  Patient was then admitted to the hospital for further evaluation and treatment.  During hospitalization, patient persisted to have lower back pain.  He has been  adjusted on  pain medication regimen with variable control of his pain..  Currently, awaiting for skilled nursing facility/CIR placement but being limited by ability to tolerate the hemodialysis chair.   Principal Problem:   Lumbar back pain Active Problems:   Subclinical hypothyroidism   Anemia due to chronic kidney disease   Diabetes mellitus type II, non insulin dependent (HCC)   ESRD on hemodialysis (HCC)   Pleural effusion   Elevated  troponin   Leg pain   Leukocytosis   History of bacteremia  Lumbar/sacral osteomyelitis/discitis, L5-S1, Septic arthritis L5-S1, and on admission, left psoas abscess s/p IR aspiration Patient presented with worsening lumbar back pain with radiation of pain.  History of MSSA bacteremia on 03/13/2021.  Currently afebrile with no leukocytosis. MRI of the lumbosacral spine on 8/ 5/22 was repeated which was was notable for L5-S1 discitis with osteomyelitis with left psoas abscess.  IR drained 60 mL of purulent fluid from the left psoas region.  Neurosurgery and infectious disease followed the patient during hospitalization.  Repeat blood cultures have been negative patient is afebrile without leukocytosis.  Fluid culture from aspiration showed Staph aureus sensitive to all antibiotics.   Plan to continue cefazolin with hemodialysis for 6 to 8 weeks.  Tentative end date of antibiotics 04/30/2021 as per infectious disease.  Intractable back pain.  Variable improvement.  Little better after adding fentanyl patch.  Continue oxycodone, Dilaudid Lidoderm patch, diclofenac patch and Neurontin.  On 300 mg of Neurontin a day which is likely maximum for end-stage renal disease.  Patient has end-stage renal disease limiting use of increasing or use of other narcotics.  Left face contusion:  On presentation.  Improved   Elevated troponin:  On presentation.  ACS ruled out.  Nonspecific.   ESRD on HD with hyperkalemia.:  Continue hemodialysis as per nephrology.  Latest potassium of 5.0  Diabetes mellitus type 2:  Latest hemoglobin A1c of 5.1 on 01/16/2021.  Diet controlled   History of MSSA bacteremia:  Has previously completed 6 weeks of antibiotic.   On IV cefazolin at this time.  Plan to  continue IV antibiotic for 6 to 8 weeks as per ID recommendation.   Anemia chronic kidney disease:  Baseline hemoglobin of around 8.0.  Latest hemoglobin of 9.0.  Subclinical hypothyroidism:  T4 normal.  TSH elevated.   Follow-up in 4 to 6 weeks  Weakness/deconditioning/gait disturbance: Physical therapy on board and recommending nursing facility placement.    DVT prophylaxis: heparin injection 5,000 Units Start: 03/15/21 1400   CODE STATUS-full code  Family Communication:   None today.  Disposition Plan:   Level of care: Med-Surg  Status is: Inpatient  Remains inpatient appropriate because:  IV treatments appropriate due to intensity of illness or inability to take PO, and Inpatient level of care appropriate due to severity of illness, awaiting for placement.  Dispo: The patient is from: Home              Anticipated d/c is to: Likely to skilled nursing facility              Patient currently is medically stable to d/c.   Difficult to place patient No  Consultants:  Neurosurgery Nephrology Infectious disease Interventional radiology  Procedures:  IR guided aspiration of the left psoas abscess on 03/19/2021 with removal of 60 mL purulent fluid. Hemodialysis.  Antimicrobials:  Ceftriaxone 8/4 - 8/6 Cefazolin 8/6>>  Subjective:  Today, patient was seen and examined at bedside.  Patient states that he still has back pain little worse than yesterday but had felt better yesterday   Objective: Vitals:   03/31/21 0430 03/31/21 0530 03/31/21 0604 03/31/21 1238  BP: (!) 134/58 (!) 128/58 127/63 (!) 155/59  Pulse:   62 71  Resp:  '16 16 16  '$ Temp:  98.4 F (36.9 C) 98.3 F (36.8 C)   TempSrc:  Oral Oral   SpO2:   100% 99%  Weight:      Height:        Intake/Output Summary (Last 24 hours) at 03/31/2021 1311 Last data filed at 03/31/2021 0530 Gross per 24 hour  Intake 480 ml  Output 1044 ml  Net -564 ml     Filed Weights   03/29/21 0318 03/29/21 0638 03/30/21 0600  Weight: 63.4 kg 62.2 kg 62.6 kg   Physical examination:  General:  Average built, not in obvious distress, alert awake and oriented. HENT:   No scleral pallor or icterus noted. Oral mucosa is moist.  Chest:  Clear  breath sounds.  Diminished breath sounds bilaterally. No crackles or wheezes.  CVS: S1 &S2 heard. No murmur.  Regular rate and rhythm. Abdomen: Soft, nontender, nondistended.  Bowel sounds are heard.   Extremities: Left upper extremity AV fistula. Psych: Alert, awake and oriented, normal mood CNS:  No cranial nerve deficits.  Power equal in all extremities.   Skin: Warm and dry.  No rashes noted.  Data Reviewed: I have personally reviewed the following labs and imaging studies   CBC: Recent Labs  Lab 03/25/21 0306 03/27/21 0517 03/29/21 0113 03/30/21 0214  WBC 6.9 8.5 15.4* 9.0  HGB 9.9* 9.6* 8.8* 9.0*  HCT 32.5* 32.7* 29.5* 29.8*  MCV 97.9 99.1 98.0 99.3  PLT 224 302 347 [id number]    Basic Metabolic Panel: Recent Labs  Lab 03/27/21 0517 03/28/21 0251 03/29/21 0113 03/30/21 0214 03/31/21 0300  NA 135 132* 130* 132* 130*  K 4.8 5.0 6.2* 4.2 5.0  CL 96* 92* 90* 91* 90*  CO2 '27 28 30 30 30  '$ GLUCOSE 102* 155* 141* 109* 137*  BUN 31* 45* 57*  30* 43*  CREATININE 5.66* 6.94* 8.39* 5.31* 6.75*  CALCIUM 8.7* 9.0 9.4 9.0 9.4  MG 2.3  --  2.9*  --   --   PHOS 3.5 3.7 2.8 3.0 3.1    GFR: Estimated Creatinine Clearance: 8.3 mL/min (A) (by C-G formula based on SCr of 6.75 mg/dL (H)). Liver Function Tests: Recent Labs  Lab 03/27/21 0517 03/28/21 0251 03/29/21 0113 03/30/21 0214 03/31/21 0300  ALBUMIN 2.6* 2.5* 2.6* 2.4* 2.5*    No results for input(s): LIPASE, AMYLASE in the last 168 hours. No results for input(s): AMMONIA in the last 168 hours. Coagulation Profile: No results for input(s): INR, PROTIME in the last 168 hours.  Cardiac Enzymes: No results for input(s): CKTOTAL, CKMB, CKMBINDEX, TROPONINI in the last 168 hours. BNP (last 3 results) No results for input(s): PROBNP in the last 8760 hours. HbA1C: No results for input(s): HGBA1C in the last 72 hours. CBG: Recent Labs  Lab 03/29/21 0250  GLUCAP 136*    Lipid Profile: No results for input(s): CHOL, HDL,  LDLCALC, TRIG, CHOLHDL, LDLDIRECT in the last 72 hours. Thyroid Function Tests: No results for input(s): TSH, T4TOTAL, FREET4, T3FREE, THYROIDAB in the last 72 hours.  Anemia Panel: No results for input(s): VITAMINB12, FOLATE, FERRITIN, TIBC, IRON, RETICCTPCT in the last 72 hours. Sepsis Labs: No results for input(s): PROCALCITON, LATICACIDVEN in the last 168 hours.  No results found for this or any previous visit (from the past 240 hour(s)).     Radiology Studies: No results found.    Scheduled Meds:  Chlorhexidine Gluconate Cloth  6 each Topical Q0600   darbepoetin (ARANESP) injection - DIALYSIS  100 mcg Intravenous Q Mon-HD   diclofenac  1 patch Transdermal BID   doxercalciferol  3 mcg Intravenous Q M,W,F-HD   feeding supplement (NEPRO CARB STEADY)  237 mL Oral BID BM   fentaNYL  1 patch Transdermal Q72H   gabapentin  100 mg Oral TID   heparin  5,000 Units Subcutaneous Q8H   lidocaine  1 patch Transdermal Q24H   midodrine  10 mg Oral Q M,W,F-HD   multivitamin  1 tablet Oral QHS   polyethylene glycol  17 g Oral BID   senna-docusate  2 tablet Oral BID   sodium chloride flush  3 mL Intravenous Q12H   sucroferric oxyhydroxide  1,000 mg Oral TID WC   Continuous Infusions:   ceFAZolin (ANCEF) IV Stopped (03/30/21 2203)     LOS: 16 days    Flora Lipps, MD [facility] 03/31/2021, 1:11 PM

## 2021-03-31 NOTE — Progress Notes (Signed)
[facility] Progress Note   Subjective:     Seen in room.  Back pain is bad today.  Trying to drink coffee.  Got bumped from HD schedule yesterday d/t staffing issues, for HD today.  Objective Vitals:   03/31/21 0400 03/31/21 0430 03/31/21 0530 03/31/21 0604  BP: (!) 112/53 (!) 134/58 (!) 128/58 127/63  Pulse:    62  Resp:   16 16  Temp:   98.4 F (36.9 C) 98.3 F (36.8 C)  TempSrc:   Oral Oral  SpO2:    100%  Weight:      Height:       Physical Exam General: NAD Heart: RRR Lungs: clear bilaterally Abdomen:Soft, non-tender, (+) bowel sounds Extremities:No edema BLLE Dialysis Access: L forearm AVF (+) Bruit/Thrill   Filed Weights   03/29/21 0318 03/29/21 0638 03/30/21 0600  Weight: 63.4 kg 62.2 kg 62.6 kg    Intake/Output Summary (Last 24 hours) at 03/31/2021 0906 Last data filed at 03/31/2021 0530 Gross per 24 hour  Intake 480 ml  Output 1044 ml  Net -564 ml    Additional Objective Labs: Basic Metabolic Panel: Recent Labs  Lab 03/29/21 0113 03/30/21 0214 03/31/21 0300  NA 130* 132* 130*  K 6.2* 4.2 5.0  CL 90* 91* 90*  CO2 '30 30 30  '$ GLUCOSE 141* 109* 137*  BUN 57* 30* 43*  CREATININE 8.39* 5.31* 6.75*  CALCIUM 9.4 9.0 9.4  PHOS 2.8 3.0 3.1   Liver Function Tests: Recent Labs  Lab 03/29/21 0113 03/30/21 0214 03/31/21 0300  ALBUMIN 2.6* 2.4* 2.5*   No results for input(s): LIPASE, AMYLASE in the last 168 hours. CBC: Recent Labs  Lab 03/25/21 0306 03/27/21 0517 03/29/21 0113 03/30/21 0214  WBC 6.9 8.5 15.4* 9.0  HGB 9.9* 9.6* 8.8* 9.0*  HCT 32.5* 32.7* 29.5* 29.8*  MCV 97.9 99.1 98.0 99.3  PLT 224 302 347 361   Blood Culture    Component Value Date/Time   SDES ABSCESS 03/19/2021 1709   SPECREQUEST LEFT PSOAS 03/19/2021 1709   CULT  03/19/2021 1709    FEW STAPHYLOCOCCUS AUREUS NO ANAEROBES ISOLATED Performed at [facility], [place] [address] [place], [place] [address]    REPTSTATUS 03/24/2021 FINAL 03/19/2021  1709    Cardiac Enzymes: No results for input(s): CKTOTAL, CKMB, CKMBINDEX, TROPONINI in the last 168 hours. CBG: Recent Labs  Lab 03/29/21 0250  GLUCAP 136*   Iron Studies: No results for input(s): IRON, TIBC, TRANSFERRIN, FERRITIN in the last 72 hours. Lab Results  Component Value Date   INR 1.1 03/19/2021   Studies/Results: No results found.  Medications:   ceFAZolin (ANCEF) IV Stopped (03/30/21 2203)    Chlorhexidine Gluconate Cloth  6 each Topical Q0600   darbepoetin (ARANESP) injection - DIALYSIS  100 mcg Intravenous Q Mon-HD   diclofenac  1 patch Transdermal BID   doxercalciferol  3 mcg Intravenous Q M,W,F-HD   feeding supplement (NEPRO CARB STEADY)  237 mL Oral BID BM   fentaNYL  1 patch Transdermal Q72H   gabapentin  100 mg Oral TID   heparin  5,000 Units Subcutaneous Q8H   lidocaine  1 patch Transdermal Q24H   midodrine  10 mg Oral Q M,W,F-HD   multivitamin  1 tablet Oral QHS   polyethylene glycol  17 g Oral BID   senna-docusate  2 tablet Oral BID   sodium chloride flush  3 mL Intravenous Q12H   sucroferric oxyhydroxide  1,000 mg Oral TID WC  Dialysis Orders: [facility] M[facility]  3h 59mn  180NRe 400/600  64kg   2/2 bath  P4  AVF   -Heparin  2200 units IV TIW -Mircera 150 mcg IV q 2 week (last dose 150 mcg IV 02/28/2021) -Venofer 100 mg IV X 10 doses (4/6 doses given. 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  Neurosurgery evaluated and no intervention deemed necessary.  Seen by  ID input, started cefazolin (as likely MSSA given h/o MSSA bacteremia recently).  IR did CT guided aspiration of 60 cc on 8/8, site to small for drain placement per IR.  Per ID, Continue cefazolin with HD, plan for 6 to 8 weeks course. Tentative end date is 04/30/21. ESRD - Back on MWF schedule. Previously, he is not able to sit up in recliner without  significant discomfort and he says that his back pain is still too bad to sit up for next rx. He has been exercising in his room lately. He will need rehab before he can be discharged to home for outpatient dialysis.  Hypertension/volume  - As noted above. BP well controlled. Continue midodrine 10 mg PO TIW 30 prior to HD. May repeat mid HD if necessary. No evidence of overt volume overload by exam on admit and currently. Now very much under EDW. Will need EDW reset on discharge. UF as tolerated.   Anemia  - HGB 9.9.  Missed OP ESA 08/03. On aranesp 100 mcg q Monday here. Metabolic bone disease - Continue velphoro 500 mg 2 tabs PO TID AC. Continue VDRA. Labs at goal.  Nutrition - Renal carb mod diet, nepro, renal vits. DM-No meds on OP list. Last HA1c 6.9. per primary Intractable back pain: this is the main barrier to him sitting up/ rehabbing.   Disposition - SNF recommended.   EMadelon LipsMD CGreensburgPgr [phone]/20/2022,9:06 AM  LOS: 16 days

## 2021-04-01 DIAGNOSIS — M545 Low back pain, unspecified: Secondary | ICD-10-CM | POA: Diagnosis not present

## 2021-04-01 LAB — RENAL FUNCTION PANEL
Albumin: 2.5 g/dL — ABNORMAL LOW (ref 3.5–5.0)
Anion gap: 8 (ref 5–15)
BUN: 30 mg/dL — ABNORMAL HIGH (ref 8–23)
CO2: 29 mmol/L (ref 22–32)
Calcium: 9.2 mg/dL (ref 8.9–10.3)
Chloride: 95 mmol/L — ABNORMAL LOW (ref 98–111)
Creatinine, Ser: 4.53 mg/dL — ABNORMAL HIGH (ref 0.61–1.24)
GFR, Estimated: 14 mL/min — ABNORMAL LOW (ref >60)
Glucose, Bld: 118 mg/dL — ABNORMAL HIGH (ref 70–99)
Phosphorus: 2.7 mg/dL (ref 2.5–4.6)
Potassium: 4.5 mmol/L (ref 3.5–5.1)
Sodium: 132 mmol/L — ABNORMAL LOW (ref 135–145)

## 2021-04-01 LAB — OCCULT BLOOD X 1 CARD TO LAB, STOOL: Fecal Occult Bld: NEGATIVE

## 2021-04-01 LAB — CBC
HCT: 31.3 % — ABNORMAL LOW (ref 39.0–52.0)
Hemoglobin: 9.1 g/dL — ABNORMAL LOW (ref 13.0–17.0)
MCH: 29.4 pg (ref 26.0–34.0)
MCHC: 29.1 g/dL — ABNORMAL LOW (ref 30.0–36.0)
MCV: 101.3 fL — ABNORMAL HIGH (ref 80.0–100.0)
Platelets: 382 10*3/uL (ref 150–400)
RBC: 3.09 MIL/uL — ABNORMAL LOW (ref 4.22–5.81)
RDW: 16.8 % — ABNORMAL HIGH (ref 11.5–15.5)
WBC: 6.1 10*3/uL (ref 4.0–10.5)
nRBC: 0 % (ref 0.0–0.2)

## 2021-04-01 NOTE — TOC Progression Note (Addendum)
Transition of Care [facility]) - Progression Note    Patient Details  Name: JABRIL PURSELL MRN: [id number] Date of Birth: 07/19/1956  Transition of Care [facility]) CM/SW [place], [place] Phone Number: 04/01/2021, 11:35 AM  Clinical Narrative:    CSW spoke with pt via translator, to give him his bed offers for rehab. CSW answered all questions. Pt requested CSW come back later after he had a chance to review his options. He stated that he wanted to see how close they were to his house. CSW agreed to come back later for choice.  2:30pm. CSW met with pt and spouse at bedside with the assistance of the interpretor. CSW answered all questions and family chose [facility]. TOC will need to follow up with [facility] tomorrow to request bed.   Expected Discharge Plan: IP Rehab Facility Barriers to Discharge: Other (must enter comment), Continued Medical Work up (ability to sit for HD)  Expected Discharge Plan and Services Expected Discharge Plan: [place]     Post Acute Care Choice: IP Rehab Living arrangements for the past 2 months: Single Family Home                                       Social Determinants of Health (SDOH) Interventions    Readmission Risk Interventions No flowsheet data found.

## 2021-04-01 NOTE — Progress Notes (Signed)
[facility] Progress Note   Subjective:     Sig back pain ongoing.  D/w RN staff--> will try to see if he can sit up in the chair for any period of time today  Objective Vitals:   03/31/21 0604 03/31/21 1238 03/31/21 2120 04/01/21 0419  BP: 127/63 (!) 155/59 137/60 140/60  Pulse: 62 71 67 68  Resp: '16 16 16 16  '$ Temp: 98.3 F (36.8 C)  98.8 F (37.1 C) 97.6 F (36.4 C)  TempSrc: Oral  Oral Oral  SpO2: 100% 99% 100% 93%  Weight:      Height:       Physical Exam General: NAD Heart: RRR Lungs: clear bilaterally Abdomen:Soft, non-tender, (+) bowel sounds Extremities:No edema BLLE Dialysis Access: L forearm AVF (+) Bruit/Thrill   Filed Weights   03/29/21 0318 03/29/21 0638 03/30/21 0600  Weight: 63.4 kg 62.2 kg 62.6 kg    Intake/Output Summary (Last 24 hours) at 04/01/2021 1007 Last data filed at 04/01/2021 0521 Gross per 24 hour  Intake 340 ml  Output 1 ml  Net 339 ml    Additional Objective Labs: Basic Metabolic Panel: Recent Labs  Lab 03/30/21 0214 03/31/21 0300 04/01/21 0308  NA 132* 130* 132*  K 4.2 5.0 4.5  CL 91* 90* 95*  CO2 '30 30 29  '$ GLUCOSE 109* 137* 118*  BUN 30* 43* 30*  CREATININE 5.31* 6.75* 4.53*  CALCIUM 9.0 9.4 9.2  PHOS 3.0 3.1 2.7   Liver Function Tests: Recent Labs  Lab 03/30/21 0214 03/31/21 0300 04/01/21 0308  ALBUMIN 2.4* 2.5* 2.5*   No results for input(s): LIPASE, AMYLASE in the last 168 hours. CBC: Recent Labs  Lab 03/27/21 0517 03/29/21 0113 03/30/21 0214 04/01/21 0308  WBC 8.5 15.4* 9.0 6.1  HGB 9.6* 8.8* 9.0* 9.1*  HCT 32.7* 29.5* 29.8* 31.3*  MCV 99.1 98.0 99.3 101.3*  PLT 302 347 361 382   Blood Culture    Component Value Date/Time   SDES ABSCESS 03/19/2021 1709   SPECREQUEST LEFT PSOAS 03/19/2021 1709   CULT  03/19/2021 1709    FEW STAPHYLOCOCCUS AUREUS NO ANAEROBES ISOLATED Performed at [facility] [address] [place], [place] [address]    REPTSTATUS 03/24/2021 FINAL  03/19/2021 1709    Cardiac Enzymes: No results for input(s): CKTOTAL, CKMB, CKMBINDEX, TROPONINI in the last 168 hours. CBG: Recent Labs  Lab 03/29/21 0250  GLUCAP 136*   Iron Studies: No results for input(s): IRON, TIBC, TRANSFERRIN, FERRITIN in the last 72 hours. Lab Results  Component Value Date   INR 1.1 03/19/2021   Studies/Results: No results found.  Medications:   ceFAZolin (ANCEF) IV 1 g (03/31/21 2137)    Chlorhexidine Gluconate Cloth  6 each Topical Q0600   darbepoetin (ARANESP) injection - DIALYSIS  100 mcg Intravenous Q Mon-HD   diclofenac  1 patch Transdermal BID   doxercalciferol  3 mcg Intravenous Q M,W,F-HD   feeding supplement (NEPRO CARB STEADY)  237 mL Oral BID BM   fentaNYL  1 patch Transdermal Q72H   gabapentin  100 mg Oral TID   heparin  5,000 Units Subcutaneous Q8H   lidocaine  1 patch Transdermal Q24H   midodrine  10 mg Oral Q M,W,F-HD   multivitamin  1 tablet Oral QHS   polyethylene glycol  17 g Oral BID   senna-docusate  2 tablet Oral BID   sodium chloride flush  3 mL Intravenous Q12H   sucroferric oxyhydroxide  1,000 mg Oral TID  WC    Dialysis Orders: [facility] M[facility]  3h 9mn  180NRe 400/600  64kg   2/2 bath  P4  AVF   -Heparin  2200 units IV TIW -Mircera 150 mcg IV q 2 week (last dose 150 mcg IV 02/28/2021) -Venofer 100 mg IV X 10 doses (4/6 doses given. 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  Neurosurgery evaluated and no intervention deemed necessary.  Seen by  ID input, started cefazolin (as likely MSSA given h/o MSSA bacteremia recently).  IR did CT guided aspiration of 60 cc on 8/8, site to small for drain placement per IR.  Per ID, Continue cefazolin with HD, plan for 6 to 8 weeks course. Tentative end date is 04/30/21. ESRD - Back on MWF schedule. Previously, he is not able to sit up in recliner without  significant discomfort and he says that his back pain is still too bad to sit for rx He has been exercising in his room lately. He will need rehab before he can be discharged to home for outpatient dialysis.  I discussed with him today that he will not be able to leave the hospital unless he gets HD in a chair.  RN staff to work with him today to see how much he can sit up. Hypertension/volume  - As noted above. BP well controlled. Continue midodrine 10 mg PO TIW 30 prior to HD. May repeat mid HD if necessary. No evidence of overt volume overload by exam on admit and currently. Now very much under EDW. Will need EDW reset on discharge. UF as tolerated.   Anemia  - HGB 9.9.  Missed OP ESA 08/03. On aranesp 100 mcg q Monday here. Metabolic bone disease - Continue velphoro 500 mg 2 tabs PO TID AC. Continue VDRA. Labs at goal.  Nutrition - Renal carb mod diet, nepro, renal vits. DM-No meds on OP list. Last HA1c 6.9. per primary Intractable back pain: this is the main barrier to him sitting up/ rehabbing.   Disposition - SNF recommended.   EMadelon LipsMD CBraceyPgr [phone]/21/2022,10:07 AM  LOS: 17 days

## 2021-04-01 NOTE — Progress Notes (Signed)
PROGRESS NOTE    Ryan Wells  [id number] DOB: Oct 02, 1955 DOA: 03/14/2021 PCP: Pcp, No    Brief Narrative:   Ryan Wells is a 65 y.o. male with medical history of end-stage renal disease on hemodialysis, essential hypertension, diabetes mellitus type 2, anemia of chronic kidney disease,  presented to the hospital with back and leg pain worsening for 2 weeks.  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  He had completed 6 weeks of IV antibiotics with dialysis per ID notes from 8/2.  Patient continued to have worsening symptoms so he came back to the ED.  On this admission, in the emergency department patient was seen to be afebrile with stable vital signs.  Laboratory data showed leukocytosis.  Chest x-ray significant for cardiomegaly with vascular congestion, focal area with pulmonary nodularity of the right mid lung correlates with cavitating lesion and a small right-sided pleural effusion.  Patient was then admitted to the hospital for further evaluation and treatment.  During hospitalization, patient persisted to have lower back pain.  He has been  adjusted on  pain medication regimen with variable control of his pain..  Currently, awaiting for skilled nursing facility/CIR placement but being limited by ability to tolerate the hemodialysis chair.   Principal Problem:   Lumbar back pain Active Problems:   Subclinical hypothyroidism   Anemia due to chronic kidney disease   Diabetes mellitus type II, non insulin dependent (HCC)   ESRD on hemodialysis (HCC)   Pleural effusion   Elevated  troponin   Leg pain   Leukocytosis   History of bacteremia  Lumbar/sacral osteomyelitis/discitis, L5-S1, Septic arthritis L5-S1, and on admission, left psoas abscess s/p IR aspiration Patient presented with worsening lumbar back pain with radiation of pain.  History of MSSA bacteremia on 03/13/2021.  Currently afebrile with no leukocytosis. MRI of the lumbosacral spine on 8/ 5/22 was repeated which was was notable for L5-S1 discitis with osteomyelitis with left psoas abscess.  IR drained 60 mL of purulent fluid from the left psoas region.  Neurosurgery and infectious disease followed the patient during hospitalization.  Repeat blood cultures have been negative patient is afebrile without leukocytosis.  Fluid culture from aspiration showed Staph aureus sensitive to all antibiotics.   Plan to continue cefazolin with hemodialysis for 6 to 8 weeks.  Tentative end date of antibiotics 04/30/2021 as per infectious disease.  Intractable back pain.   Currently on fentanyl patch and as needed oxycodone.  Lidoderm patch.  Diclofenac patch and Neurontin.   Not use Dilaudid for last 3 days.  Will discontinue.    ESRD on HD with hyperkalemia.:  Continue hemodialysis as per nephrology.  Latest potassium of 5.0  Diabetes mellitus type 2:  Latest hemoglobin A1c of 5.1 on 01/16/2021.  Diet controlled   History of MSSA bacteremia:  Has previously completed 6 weeks of antibiotic.   On IV cefazolin at this time.  Plan to continue IV antibiotic for 6 to 8 weeks as per ID recommendation.   Anemia chronic kidney disease:  Baseline hemoglobin of around 8.0.  Latest hemoglobin of 9.0.  Subclinical hypothyroidism:  T4 normal.  TSH elevated.  Follow-up in 4  to 6 weeks  Weakness/deconditioning/gait disturbance: Physical therapy on board and recommending nursing facility placement.    DVT prophylaxis: heparin injection 5,000 Units Start: 03/15/21 1400   CODE STATUS-full code  Family Communication:   None  today.  Disposition Plan:   Level of care: Med-Surg  Status is: Inpatient  Remains inpatient appropriate because:  IV treatments appropriate due to intensity of illness or inability to take PO, and Inpatient level of care appropriate due to severity of illness, awaiting for placement.  Dispo: The patient is from: Home              Anticipated d/c is to: Likely to skilled nursing facility              Patient currently is medically stable to d/c.   Difficult to place patient No  Consultants:  Neurosurgery Nephrology Infectious disease Interventional radiology  Procedures:  IR guided aspiration of the left psoas abscess on 03/19/2021 with removal of 60 mL purulent fluid. Hemodialysis.  Antimicrobials:  Ceftriaxone 8/4 - 8/6 Cefazolin 8/6>>  Subjective: Patient seen and examined.  No overnight events.  Hurts to move and twist.  Otherwise no other complaints.  Objective: Vitals:   03/31/21 0604 03/31/21 1238 03/31/21 2120 04/01/21 0419  BP: 127/63 (!) 155/59 137/60 140/60  Pulse: 62 71 67 68  Resp: '16 16 16 16  '$ Temp: 98.3 F (36.8 C)  98.8 F (37.1 C) 97.6 F (36.4 C)  TempSrc: Oral  Oral Oral  SpO2: 100% 99% 100% 93%  Weight:      Height:        Intake/Output Summary (Last 24 hours) at 04/01/2021 1002 Last data filed at 04/01/2021 0521 Gross per 24 hour  Intake 340 ml  Output 1 ml  Net 339 ml    Filed Weights   03/29/21 0318 03/29/21 0638 03/30/21 0600  Weight: 63.4 kg 62.2 kg 62.6 kg   Physical examination:  General: Looks fairly comfortable on lying still.  It hurts to move his back. Cardiovascular: S1-S2 normal.  No murmurs. Respiratory: Bilateral clear.  No added sounds. Gastrointestinal: Soft and nontender.  Bowel sounds present. Ext: No cyanosis or edema.  Left upper extremity AV fistula present.  Data Reviewed: I have personally reviewed the following labs and imaging studies   CBC: Recent Labs  Lab 03/27/21 0517 03/29/21 0113 03/30/21 0214  04/01/21 0308  WBC 8.5 15.4* 9.0 6.1  HGB 9.6* 8.8* 9.0* 9.1*  HCT 32.7* 29.5* 29.8* 31.3*  MCV 99.1 98.0 99.3 101.3*  PLT 302 347 361 [id number]   Basic Metabolic Panel: Recent Labs  Lab 03/27/21 0517 03/28/21 0251 03/29/21 0113 03/30/21 0214 03/31/21 0300 04/01/21 0308  NA 135 132* 130* 132* 130* 132*  K 4.8 5.0 6.2* 4.2 5.0 4.5  CL 96* 92* 90* 91* 90* 95*  CO2 '27 28 30 30 30 29  '$ GLUCOSE 102* 155* 141* 109* 137* 118*  BUN 31* 45* 57* 30* 43* 30*  CREATININE 5.66* 6.94* 8.39* 5.31* 6.75* 4.53*  CALCIUM 8.7* 9.0 9.4 9.0 9.4 9.2  MG 2.3  --  2.9*  --   --   --   PHOS 3.5 3.7 2.8 3.0 3.1 2.7   GFR: Estimated Creatinine Clearance: 12.4 mL/min (A) (by C-G formula based on SCr of 4.53 mg/dL (H)). Liver Function Tests: Recent Labs  Lab 03/28/21 0251 03/29/21 0113 03/30/21 0214 03/31/21 0300 04/01/21 0308  ALBUMIN 2.5* 2.6* 2.4* 2.5* 2.5*   No results for input(s): LIPASE, AMYLASE in the  last 168 hours. No results for input(s): AMMONIA in the last 168 hours. Coagulation Profile: No results for input(s): INR, PROTIME in the last 168 hours.  Cardiac Enzymes: No results for input(s): CKTOTAL, CKMB, CKMBINDEX, TROPONINI in the last 168 hours. BNP (last 3 results) No results for input(s): PROBNP in the last 8760 hours. HbA1C: No results for input(s): HGBA1C in the last 72 hours. CBG: Recent Labs  Lab 03/29/21 0250  GLUCAP 136*   Lipid Profile: No results for input(s): CHOL, HDL, LDLCALC, TRIG, CHOLHDL, LDLDIRECT in the last 72 hours. Thyroid Function Tests: No results for input(s): TSH, T4TOTAL, FREET4, T3FREE, THYROIDAB in the last 72 hours.  Anemia Panel: No results for input(s): VITAMINB12, FOLATE, FERRITIN, TIBC, IRON, RETICCTPCT in the last 72 hours. Sepsis Labs: No results for input(s): PROCALCITON, LATICACIDVEN in the last 168 hours.  No results found for this or any previous visit (from the past 240 hour(s)).     Radiology Studies: No results  found.    Scheduled Meds:  Chlorhexidine Gluconate Cloth  6 each Topical Q0600   darbepoetin (ARANESP) injection - DIALYSIS  100 mcg Intravenous Q Mon-HD   diclofenac  1 patch Transdermal BID   doxercalciferol  3 mcg Intravenous Q M,W,F-HD   feeding supplement (NEPRO CARB STEADY)  237 mL Oral BID BM   fentaNYL  1 patch Transdermal Q72H   gabapentin  100 mg Oral TID   heparin  5,000 Units Subcutaneous Q8H   lidocaine  1 patch Transdermal Q24H   midodrine  10 mg Oral Q M,W,F-HD   multivitamin  1 tablet Oral QHS   polyethylene glycol  17 g Oral BID   senna-docusate  2 tablet Oral BID   sodium chloride flush  3 mL Intravenous Q12H   sucroferric oxyhydroxide  1,000 mg Oral TID WC   Continuous Infusions:   ceFAZolin (ANCEF) IV 1 g (03/31/21 2137)     LOS: 17 days  Total time spent: 25 minutes  Barb Merino, MD [facility] 04/01/2021, 10:02 AM

## 2021-04-02 DIAGNOSIS — M545 Low back pain, unspecified: Secondary | ICD-10-CM | POA: Diagnosis not present

## 2021-04-02 LAB — RENAL FUNCTION PANEL
Albumin: 2.5 g/dL — ABNORMAL LOW (ref 3.5–5.0)
Albumin: 2.5 g/dL — ABNORMAL LOW (ref 3.5–5.0)
Anion gap: 8 (ref 5–15)
Anion gap: 9 (ref 5–15)
BUN: 41 mg/dL — ABNORMAL HIGH (ref 8–23)
BUN: 43 mg/dL — ABNORMAL HIGH (ref 8–23)
CO2: 30 mmol/L (ref 22–32)
CO2: 29 mmol/L (ref 22–32)
Calcium: 9.7 mg/dL (ref 8.9–10.3)
Calcium: 9.8 mg/dL (ref 8.9–10.3)
Chloride: 94 mmol/L — ABNORMAL LOW (ref 98–111)
Chloride: 96 mmol/L — ABNORMAL LOW (ref 98–111)
Creatinine, Ser: 6.28 mg/dL — ABNORMAL HIGH (ref 0.61–1.24)
Creatinine, Ser: 6.6 mg/dL — ABNORMAL HIGH (ref 0.61–1.24)
GFR, Estimated: 9 mL/min — ABNORMAL LOW (ref >60)
GFR, Estimated: 9 mL/min — ABNORMAL LOW (ref >60)
Glucose, Bld: 132 mg/dL — ABNORMAL HIGH (ref 70–99)
Glucose, Bld: 111 mg/dL — ABNORMAL HIGH (ref 70–99)
Phosphorus: 2.7 mg/dL (ref 2.5–4.6)
Phosphorus: 2.7 mg/dL (ref 2.5–4.6)
Potassium: 4.9 mmol/L (ref 3.5–5.1)
Potassium: 5.2 mmol/L — ABNORMAL HIGH (ref 3.5–5.1)
Sodium: 132 mmol/L — ABNORMAL LOW (ref 135–145)
Sodium: 134 mmol/L — ABNORMAL LOW (ref 135–145)

## 2021-04-02 LAB — CBC
HCT: 29.4 % — ABNORMAL LOW (ref 39.0–52.0)
Hemoglobin: 8.7 g/dL — ABNORMAL LOW (ref 13.0–17.0)
MCH: 30 pg (ref 26.0–34.0)
MCHC: 29.6 g/dL — ABNORMAL LOW (ref 30.0–36.0)
MCV: 101.4 fL — ABNORMAL HIGH (ref 80.0–100.0)
Platelets: 390 10*3/uL (ref 150–400)
RBC: 2.9 MIL/uL — ABNORMAL LOW (ref 4.22–5.81)
RDW: 16.6 % — ABNORMAL HIGH (ref 11.5–15.5)
WBC: 6.9 10*3/uL (ref 4.0–10.5)
nRBC: 0 % (ref 0.0–0.2)

## 2021-04-02 MED ORDER — HYDROCODONE-ACETAMINOPHEN 10-325 MG PO TABS
1.0000 | ORAL_TABLET | ORAL | Status: DC | PRN
  Administered 2021-04-02 – 2021-04-05 (×5): 1 via ORAL
  Filled 2021-04-02 (×5): qty 1

## 2021-04-02 MED ORDER — MIDODRINE HCL 5 MG PO TABS: 5.0000 mg | ORAL_TABLET | Freq: Three times a day (TID) | ORAL | Status: DC

## 2021-04-02 MED ORDER — CYCLOBENZAPRINE HCL 10 MG PO TABS
5.0000 mg | ORAL_TABLET | Freq: Three times a day (TID) | ORAL | Status: DC
  Administered 2021-04-02 – 2021-04-03 (×5): 5 mg via ORAL
  Filled 2021-04-02 (×6): qty 1

## 2021-04-02 MED ORDER — HEPARIN SODIUM (PORCINE) 1000 UNIT/ML IJ SOLN: 2200.0000 [IU] | Freq: Once | INTRAMUSCULAR | Status: DC

## 2021-04-02 MED ORDER — FENTANYL 50 MCG/HR TD PT72
1.0000 | MEDICATED_PATCH | TRANSDERMAL | Status: DC
  Administered 2021-04-02: 1 via TRANSDERMAL
  Filled 2021-04-02: qty 1

## 2021-04-02 NOTE — Progress Notes (Addendum)
Physical Therapy Treatment Patient Details Name: Ryan Wells MRN: [id number] DOB: 07/08/1956 Today's Date: 04/02/2021    History of Present Illness Pt is 65 y.o. male presenting to ED on 8/3 with severe back/leg pain x2 weeks and diaphoresis.  Pt admitted with lumbar sacral osteomyelitis/discitis with septic arthritis L5-S1 and bil psoas abscess, R sided pleural effusion. . 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 At time of previous admission MRI (+) L5-S1 facet osteoarthritis, synovial cysts and periarticular soft tissue edema on the left. 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 Returned to ED 7/25 c/o radicular low back pain. 8/8 CT guided aspiration of left psoas abscess   PMHx significant for ESRD on HD, HTN, DMII, chornic anemia and MSSA.    PT Comments    Pt admitted with above diagnosis. Initially pt refusing. PT encouraged pt to at least sit EOB. Pt was able to stand to RW with mod assist of 2 with incr pain.  Pt still limited by pain but was able to change all linens as his linens were soiled.  Noted that brace ordered on 8/20 for comfort still not in room, therefore called Ortho tech to inquire and they are supposed to bring one up this pm.  Pt making slow progress.  Pt met 0/5 goals due to pain limiting his mobility. Revised goals. Pt currently with functional limitations due to balance and endurance deficits.  Pt will benefit from skilled PT to increase their independence and safety with mobility to allow discharge to the venue listed below.      Follow Up Recommendations  SNF     Equipment Recommendations  Rolling walker with 5" wheels    Recommendations for Other Services       Precautions / Restrictions Precautions Precautions: Fall Precaution Comments: Back pain - back precautions for comfort Required Braces or Orthoses: Spinal Brace Spinal Brace: Applied in sitting position;Thoracolumbosacral  orthotic (MD ordered brace but it wasnt in room, called Ortho techs to bring brace.) Restrictions Weight Bearing Restrictions: No    Mobility  Bed Mobility Overal bed mobility: Needs Assistance Bed Mobility: Rolling;Sidelying to Sit;Sit to Sidelying Rolling: Min assist;Min guard Sidelying to sit: Mod assist;+2 for physical assistance     Sit to sidelying: Mod assist;+2 for physical assistance General bed mobility comments: Pt rolled to the L without a lot of help. He required assistance to move LEs to edge of bed and elevate trunk into a seated position.    Transfers Overall transfer level: Needs assistance Equipment used: Rolling walker (2 wheeled) Transfers: Sit to/from Stand Sit to Stand: Mod assist;+2 physical assistance         General transfer comment: Pt needed mod assist to power up from bed with one hand on RW and one pushing up from bed.  Stood with flexed trunk not geting fully upright with sort of a right side flexed posture.  Bed had dirty sheets on it therefore while pt standing took the time to change all pts sheets/pads.  Pt appreciative. Pt ended up standing x 2.  Pt also was able to take a few side steps to [facility] as well prior to sitting back down. Pt limited by pain.  Ambulation/Gait             General Gait Details: Pt refused to walk today   Stairs             Wheelchair Mobility    Modified Rankin (Stroke Patients  Only)       Balance Overall balance assessment: Needs assistance Sitting-balance support: Bilateral upper extremity supported;Feet supported;No upper extremity supported Sitting balance-Leahy Scale: Fair Sitting balance - Comments:  (limited by pain; pushing toward side to offload hip at times)   Standing balance support: Bilateral upper extremity supported Standing balance-Leahy Scale: Poor Standing balance comment: Requiring walker and assist of 2                            Cognition Arousal/Alertness:  Awake/alert Behavior During Therapy: Anxious Overall Cognitive Status: Within Functional Limits for tasks assessed                                 General Comments: most likely baseline - some distraction due to pain      Exercises      General Comments        Pertinent Vitals/Pain Pain Assessment: Faces Faces Pain Scale: Hurts worst Pain Location: Reports 9/10 in back and 7/10 in R thigh/quad.  More painful rolling to L side of bed. Pain Descriptors / Indicators: Discomfort;Grimacing;Guarding;Moaning;Sharp Pain Intervention(s): Limited activity within patient's tolerance;Monitored during session;Premedicated before session;Repositioned    Home Living                      Prior Function            PT Goals (current goals can now be found in the care plan section) Acute Rehab PT Goals Patient Stated Goal: to be able to do for myself PT Goal Formulation: With patient Time For Goal Achievement: 04/16/21 Potential to Achieve Goals: Fair Progress towards PT goals: Not progressing toward goals - comment (limited by pain)    Frequency    Min 3X/week      PT Plan Current plan remains appropriate    Co-evaluation              AM-PAC PT "6 Clicks" Mobility   Outcome Measure  Help needed turning from your back to your side while in a flat bed without using bedrails?: A Lot Help needed moving from lying on your back to sitting on the side of a flat bed without using bedrails?: A Lot Help needed moving to and from a bed to a chair (including a wheelchair)?: Total Help needed standing up from a chair using your arms (e.g., wheelchair or bedside chair)?: Total Help needed to walk in hospital room?: Total Help needed climbing 3-5 steps with a railing? : Total 6 Click Score: 8    End of Session Equipment Utilized During Treatment: Gait belt Activity Tolerance: Patient limited by pain Patient left: with call bell/phone within reach;in bed;with  bed alarm set;with family/visitor present Nurse Communication: Mobility status PT Visit Diagnosis: Difficulty in walking, not elsewhere classified (R26.2);Pain Pain - Right/Left:  (central back; bil LEs) Pain - part of body:  (back)     Time: 4580-9983 PT Time Calculation (min) (ACUTE ONLY): 24 min  Charges:  $Therapeutic Activity: 23-37 mins                     Laurana Magistro M,PT Acute Rehab Services [phone] (pager)    Alvira Philips 04/02/2021, 3:21 PM

## 2021-04-02 NOTE — Progress Notes (Signed)
PROGRESS NOTE    KILLIAN DOUBEK  [id number] DOB: 1955-09-19 DOA: 03/14/2021 PCP: Pcp, No    Brief Narrative:   Ryan Wells is a 65 y.o. male with medical history of end-stage renal disease on hemodialysis, essential hypertension, diabetes mellitus type 2, anemia of chronic kidney disease,  presented to the hospital with back and leg pain worsening for 2 weeks.  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  He had completed 6 weeks of IV antibiotics with dialysis per ID notes from 8/2.  Patient continued to have worsening symptoms so he came back to the ED.  On this admission, in the emergency department patient was seen to be afebrile with stable vital signs.  Laboratory data showed leukocytosis.  Chest x-ray significant for cardiomegaly with vascular congestion, focal area with pulmonary nodularity of the right mid lung correlates with cavitating lesion and a small right-sided pleural effusion.  Patient was then admitted to the hospital for further evaluation and treatment.  During hospitalization, patient persisted to have lower back pain.  He has been  adjusted on  pain medication regimen with variable control of his pain. Currently, awaiting for skilled nursing facility/CIR placement but being limited by ability to tolerate the hemodialysis chair.   Principal Problem:   Lumbar back pain Active Problems:   Subclinical hypothyroidism   Anemia due to chronic kidney disease   Diabetes mellitus type II, non insulin dependent (HCC)   ESRD on hemodialysis (HCC)   Pleural effusion   Elevated troponin    Leg pain   Leukocytosis   History of bacteremia  Lumbar/sacral osteomyelitis/discitis, L5-S1, Septic arthritis L5-S1, and on admission, left psoas abscess s/p IR aspiration Patient presented with worsening lumbar back pain with radiation of pain.  History of MSSA bacteremia on 03/13/2021.  Currently afebrile with no leukocytosis. MRI of the lumbosacral spine on 03/16/21 was repeated which was notable for L5-S1 discitis with osteomyelitis with left psoas abscess.  IR drained 60 mL of purulent fluid from the left psoas region.  Neurosurgery and infectious disease followed the patient during hospitalization.  Repeat blood cultures have been negative patient is afebrile without leukocytosis.  Fluid culture from aspiration showed Staph aureus sensitive to all antibiotics.   Plan to continue cefazolin with hemodialysis for 6 to 8 weeks.  Tentative end date of antibiotics 04/30/2021 as per infectious disease.  Intractable back pain.   Remains quite debilitating back pain.  Unable to sit up for dialysis.   Increase fentanyl patch from 25 micrograms per hour to 50 mcg/h patch.   Add Flexeril 5 mg 3 times daily scheduled.   Norco 10 mg every 4 hours, use more often.  Lidocaine patch and diclofenac patch.    ESRD on HD with hyperkalemia.:  Continue hemodialysis as per nephrology.  Now on a schedule Monday Wednesday Friday.  Diabetes mellitus type 2:  Latest hemoglobin A1c of 5.1 on 01/16/2021.  Diet controlled.   History of MSSA bacteremia:  Has previously completed 6 weeks of antibiotic.   On IV cefazolin at this time.  Plan to continue IV antibiotic for 6 to 8 weeks as per ID recommendation.   Anemia chronic kidney  disease:  Baseline hemoglobin of around 8.0.  Hemoglobin is stable.  Subclinical hypothyroidism:  T4 normal.  TSH elevated.  Follow-up in 4 to 6 weeks  Weakness/deconditioning/gait disturbance: Physical therapy on board and recommending nursing facility placement.    DVT prophylaxis:  heparin injection 5,000 Units Start: 03/15/21 1400   CODE STATUS-full code  Family Communication:   Patient's son Mr. Irving at the bedside.  Disposition Plan:   Level of care: Med-Surg  Status is: Inpatient  Remains inpatient appropriate because:  IV treatments appropriate due to intensity of illness or inability to take PO, and Inpatient level of care appropriate due to severity of illness, awaiting for placement.  Dispo: The patient is from: Home              Anticipated d/c is to: Likely to skilled nursing facility once pain is controlled and able to sit up.              Patient currently is not medically stable to d/c.   Difficult to place patient No  Consultants:  Neurosurgery Nephrology Infectious disease Interventional radiology  Procedures:  IR guided aspiration of the left psoas abscess on 03/19/2021 with removal of 60 mL purulent fluid. Hemodialysis.  Antimicrobials:  Ceftriaxone 8/4 - 8/6 Cefazolin 8/6>>  Subjective: Patient seen and examined.  He asked his son to be translating for him. Patient tells me that he is frustrated with intractable back pain and unable to walk.  He wants to go out to the son. He tries to sit up but he gets intractable back pain and spasm on attempt to sit up.   Objective: Vitals:   04/02/21 1015 04/02/21 1030 04/02/21 1045 04/02/21 1100  BP: (!) 107/49 (!) 122/51 (!) 111/50 (!) 121/58  Pulse: 62 61 61 61  Resp:      Temp:      TempSrc:      SpO2: 99% 99% 99% 99%  Weight:      Height:        Intake/Output Summary (Last 24 hours) at 04/02/2021 1339 Last data filed at 04/02/2021 0000 Gross per 24 hour  Intake 50 ml  Output 151 ml  Net -101 ml    Filed Weights   03/30/21 0600 04/02/21 0815  Weight: 62.6 kg 65 kg   Physical examination:  General: Looks fairly comfortable on lying still.  Mildly anxious and in moderate distress on attempted to sit. Cardiovascular: S1-S2 normal.  No murmurs. Respiratory: Bilateral  clear.  No added sounds. Gastrointestinal: Soft and nontender.  Bowel sounds present. Ext: No cyanosis or edema.  Left upper extremity AV fistula present.  Data Reviewed: I have personally reviewed the following labs and imaging studies   CBC: Recent Labs  Lab 03/27/21 0517 03/29/21 0113 03/30/21 0214 04/01/21 0308 04/02/21 0830  WBC 8.5 15.4* 9.0 6.1 6.9  HGB 9.6* 8.8* 9.0* 9.1* 8.7*  HCT 32.7* 29.5* 29.8* 31.3* 29.4*  MCV 99.1 98.0 99.3 101.3* 101.4*  PLT 302 347 361 382 [id number]   Basic Metabolic Panel: Recent Labs  Lab 03/27/21 0517 03/28/21 0251 03/29/21 0113 03/30/21 0214 03/31/21 0300 04/01/21 0308 04/02/21 0414 04/02/21 0830  NA 135   < > 130* 132* 130* 132* 132* 134*  K 4.8   < > 6.2* 4.2 5.0 4.5 4.9 5.2*  CL 96*   < > 90* 91* 90* 95* 94* 96*  CO2 27   < > '30 30 30 29 30 29  '$ GLUCOSE 102*   < >  141* 109* 137* 118* 132* 111*  BUN 31*   < > 57* 30* 43* 30* 41* 43*  CREATININE 5.66*   < > 8.39* 5.31* 6.75* 4.53* 6.28* 6.60*  CALCIUM 8.7*   < > 9.4 9.0 9.4 9.2 9.7 9.8  MG 2.3  --  2.9*  --   --   --   --   --   PHOS 3.5   < > 2.8 3.0 3.1 2.7 2.7 2.7   < > = values in this interval not displayed.   GFR: Estimated Creatinine Clearance: 8.5 mL/min (A) (by C-G formula based on SCr of 6.6 mg/dL (H)). Liver Function Tests: Recent Labs  Lab 03/30/21 0214 03/31/21 0300 04/01/21 0308 04/02/21 0414 04/02/21 0830  ALBUMIN 2.4* 2.5* 2.5* 2.5* 2.5*   No results for input(s): LIPASE, AMYLASE in the last 168 hours. No results for input(s): AMMONIA in the last 168 hours. Coagulation Profile: No results for input(s): INR, PROTIME in the last 168 hours.  Cardiac Enzymes: No results for input(s): CKTOTAL, CKMB, CKMBINDEX, TROPONINI in the last 168 hours. BNP (last 3 results) No results for input(s): PROBNP in the last 8760 hours. HbA1C: No results for input(s): HGBA1C in the last 72 hours. CBG: Recent Labs  Lab 03/29/21 0250  GLUCAP 136*   Lipid Profile: No  results for input(s): CHOL, HDL, LDLCALC, TRIG, CHOLHDL, LDLDIRECT in the last 72 hours. Thyroid Function Tests: No results for input(s): TSH, T4TOTAL, FREET4, T3FREE, THYROIDAB in the last 72 hours.  Anemia Panel: No results for input(s): VITAMINB12, FOLATE, FERRITIN, TIBC, IRON, RETICCTPCT in the last 72 hours. Sepsis Labs: No results for input(s): PROCALCITON, LATICACIDVEN in the last 168 hours.  No results found for this or any previous visit (from the past 240 hour(s)).     Radiology Studies: No results found.    Scheduled Meds:  Chlorhexidine Gluconate Cloth  6 each Topical Q0600   cyclobenzaprine  5 mg Oral TID   darbepoetin (ARANESP) injection - DIALYSIS  100 mcg Intravenous Q Mon-HD   diclofenac  1 patch Transdermal BID   doxercalciferol  3 mcg Intravenous Q M,W,F-HD   feeding supplement (NEPRO CARB STEADY)  237 mL Oral BID BM   fentaNYL  1 patch Transdermal Q72H   gabapentin  100 mg Oral TID   heparin  5,000 Units Subcutaneous Q8H   heparin sodium (porcine)  2,200 Units Intravenous Once   lidocaine  1 patch Transdermal Q24H   midodrine  10 mg Oral Q M,W,F-HD   multivitamin  1 tablet Oral QHS   polyethylene glycol  17 g Oral BID   senna-docusate  2 tablet Oral BID   sodium chloride flush  3 mL Intravenous Q12H   sucroferric oxyhydroxide  1,000 mg Oral TID WC   Continuous Infusions:   ceFAZolin (ANCEF) IV 1 g (04/01/21 2127)     LOS: 18 days  Total time spent: 25 minutes  Barb Merino, MD [facility] 04/02/2021, 1:39 PM

## 2021-04-02 NOTE — Progress Notes (Signed)
PT Cancellation Note  Patient Details Name: Ryan Wells MRN: [id number] DOB: 04/19/1956   Cancelled Treatment:    Reason Eval/Treat Not Completed: Patient at procedure or test/unavailable (Pt in HD. Will check back as time allows.)   Alvira Philips 04/02/2021, 9:01 AM Shana Younge M,PT Acute Rehab Services [phone] (pager)

## 2021-04-02 NOTE — Progress Notes (Signed)
[facility] Progress Note  Subjective: no new c/o  Vitals:   04/02/21 1015 04/02/21 1030 04/02/21 1045 04/02/21 1100  BP: (!) 107/49 (!) 122/51 (!) 111/50 (!) 121/58  Pulse: 62 61 61 61  Resp:      Temp:      TempSrc:      SpO2: 99% 99% 99% 99%  Weight:      Height:        Exam: General: NAD Heart: RRR Lungs: clear bilaterally Abdomen:Soft, non-tender, (+) bowel sounds Extremities:No edema BLLE Dialysis Access: L forearm AVF (+) Bruit/Thrill     [facility] HD: [facility] M[facility]  3h 34mn  180NRe 400/600  64kg   2/2 bath  P4  AVF   -Heparin  2200 units IV TIW -Mircera 150 mcg IV q 2 week (last dose 150 mcg IV 02/28/2021) -Venofer 100 mg IV X 10 doses (4/6 doses given. 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  Neurosurgery evaluated and no intervention deemed necessary.  Seen by  ID, started cefazolin (given h/o MSSA bacteremia recently).  IR did CT guided aspiration of 60 cc on 8/8, site too small for drain placement per IR.  Per ID, Continue cefazolin with HD, plan for 6 to 8 weeks course. Tentative end date is 04/30/21. ESRD - Back on MWF schedule. He has not been able to sit up in recliner without significant discomfort and he says that his back pain is still too bad to sit for HD. He has been exercising in his room lately. He will need rehab before he can be discharged to home for outpatient dialysis. We have discussed with him that he will not be able to leave the hospital unless he gets HD in a chair.  RN staff to work with him to see how much he can sit up. Hypertension/volume  - As noted above. BP well controlled. Continue midodrine 10 mg PO TIW 30 prior to HD. May repeat mid HD if necessary. No evidence of overt volume overload by exam on admit and currently. Close to dry wt today, has been down as much as 6kg under here. May need EDW reset  on discharge. UF as tolerated.   Anemia  - HGB 9.9.  Missed OP ESA 08/03. On aranesp 100 mcg q Monday here. Metabolic bone disease - Continue velphoro 500 mg 2 tabs PO TID AC. Continue VDRA. Labs at goal.  Nutrition - Renal carb mod diet, nepro, renal vits. DM-No meds on OP list. Last HA1c 6.9. per primary Intractable back pain: this is the main barrier to him sitting up/ rehabbing.   Disposition - SNF recommended.     Rob Joas Motton 04/02/2021, 1:31 PM   Recent Labs  Lab 04/01/21 0308 04/02/21 0414 04/02/21 0830  K 4.5 4.9 5.2*  BUN 30* 41* 43*  CREATININE 4.53* 6.28* 6.60*  CALCIUM 9.2 9.7 9.8  PHOS 2.7 2.7 2.7  HGB 9.1*  --  8.7*   Inpatient medications:  Chlorhexidine Gluconate Cloth  6 each Topical Q0600   darbepoetin (ARANESP) injection - DIALYSIS  100 mcg Intravenous Q Mon-HD   diclofenac  1 patch Transdermal BID   doxercalciferol  3 mcg Intravenous Q M,W,F-HD   feeding supplement (NEPRO CARB STEADY)  237 mL Oral BID BM   fentaNYL  1 patch Transdermal Q72H   gabapentin  100 mg Oral TID  heparin  5,000 Units Subcutaneous Q8H   heparin sodium (porcine)  2,200 Units Intravenous Once   lidocaine  1 patch Transdermal Q24H   midodrine  10 mg Oral Q M,W,F-HD   multivitamin  1 tablet Oral QHS   polyethylene glycol  17 g Oral BID   senna-docusate  2 tablet Oral BID   sodium chloride flush  3 mL Intravenous Q12H   sucroferric oxyhydroxide  1,000 mg Oral TID WC     ceFAZolin (ANCEF) IV 1 g (04/01/21 2127)   acetaminophen **OR** acetaminophen, albuterol, hydrOXYzine, oxyCODONE-acetaminophen

## 2021-04-02 NOTE — Progress Notes (Signed)
Orthopedic Tech Progress Note Patient Details:  Ryan Wells 1956-02-04 [id number]  Ortho Devices Type of Ortho Device: Other (comment) Ortho Device/Splint Location: BACK Ortho Device/Splint Interventions: Ordered, Application, Adjustment   Post Interventions Patient Tolerated: Well Instructions Provided: [facility] [place] 04/02/2021, 3:59 PM

## 2021-04-02 NOTE — Progress Notes (Signed)
OT Cancellation Note  Patient Details Name: ALIEU VELTKAMP MRN: [id number] DOB: 06-Feb-1956   Cancelled Treatment:    Reason Eval/Treat Not Completed: Patient at procedure or test/ unavailable (Patient in HD)  Trixie Dredge 04/02/2021, 10:37 AM Lodema Hong, OTA

## 2021-04-02 NOTE — TOC Progression Note (Addendum)
Transition of Care [facility]) - Progression Note    Patient Details  Name: JAYMZ MADDALONI MRN: [id number] Date of Birth: 1956-05-27  Transition of [facility] CM/SW Contact  Joanne Chars, LCSW Phone Number: 04/02/2021, 10:00 AM  Clinical Narrative:   CSW spoke with [employer] and they do not manage this [employer] policy.    85: CSW spoke with RN, per dialysis, it was not attempted for pt to sit in chair during HD today.  CSW spoke with Otila Kluver in HD about this.  She reports it needs to be in HD order if MD wants to to sit during HD.  Otila Kluver called back a few minutes later, had spoken to nephrology PA and he will add this to pt order moving forward.  CSW also informed by RN that pt son is here and wanting to talk about a different SNF choice than [place].  CSW spoke with son and his mother would now like pt to go to [place] rather than [facility].  Dr Sloan Leiter in room when [facility] entered and we all discussed need to continue to attempt to sit in chair during HD.  Dr Sloan Leiter still adjusting pain meds to facilitate this.      Expected Discharge Plan: IP Rehab Facility Barriers to Discharge: Other (must enter comment), Continued Medical Work up (ability to sit for HD)  Expected Discharge Plan and Services Expected Discharge Plan: [place]     Post Acute Care Choice: IP Rehab Living arrangements for the past 2 months: Single Family Home                                       Social Determinants of Health (SDOH) Interventions    Readmission Risk Interventions No flowsheet data found.

## 2021-04-03 DIAGNOSIS — M545 Low back pain, unspecified: Secondary | ICD-10-CM | POA: Diagnosis not present

## 2021-04-03 LAB — RENAL FUNCTION PANEL
Albumin: 2.7 g/dL — ABNORMAL LOW (ref 3.5–5.0)
Anion gap: 8 (ref 5–15)
BUN: 22 mg/dL (ref 8–23)
CO2: 33 mmol/L — ABNORMAL HIGH (ref 22–32)
Calcium: 9.2 mg/dL (ref 8.9–10.3)
Chloride: 94 mmol/L — ABNORMAL LOW (ref 98–111)
Creatinine, Ser: 4.28 mg/dL — ABNORMAL HIGH (ref 0.61–1.24)
GFR, Estimated: 15 mL/min — ABNORMAL LOW (ref >60)
Glucose, Bld: 106 mg/dL — ABNORMAL HIGH (ref 70–99)
Phosphorus: 2.2 mg/dL — ABNORMAL LOW (ref 2.5–4.6)
Potassium: 4.6 mmol/L (ref 3.5–5.1)
Sodium: 135 mmol/L (ref 135–145)

## 2021-04-03 MED ORDER — FENTANYL 25 MCG/HR TD PT72
1.0000 | MEDICATED_PATCH | TRANSDERMAL | Status: DC
  Administered 2021-04-03: 1 via TRANSDERMAL
  Filled 2021-04-03: qty 1

## 2021-04-03 NOTE — Progress Notes (Signed)
PROGRESS NOTE    Ryan Wells  [id number] DOB: [birth year] DOA: 03/14/2021 PCP: Pcp, No    Brief Narrative:   Ryan Wells is a 65 y.o. male with medical history of end-stage renal disease on hemodialysis, essential hypertension, diabetes mellitus type 2, anemia of chronic kidney disease,  presented to the hospital with back and leg pain worsening for 2 weeks.  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  He had completed 6 weeks of IV antibiotics with dialysis per ID notes from 8/2.  Patient continued to have worsening symptoms so he came back to the ED.  On this admission, in the emergency department patient was seen to be afebrile with stable vital signs.  Laboratory data showed leukocytosis.  Chest x-ray significant for cardiomegaly with vascular congestion, focal area with pulmonary nodularity of the right mid lung correlates with cavitating lesion and a small right-sided pleural effusion.  Patient was then admitted to the hospital for further evaluation and treatment.  During hospitalization, patient persisted to have lower back pain.  He has been  adjusted on  pain medication regimen with variable control of his pain. Currently, awaiting for skilled nursing facility/CIR placement but being limited by ability to tolerate the hemodialysis chair.   Principal Problem:   Lumbar back pain Active Problems:   Subclinical hypothyroidism   Anemia due to chronic kidney disease   Diabetes mellitus type II, non insulin dependent (HCC)   ESRD on hemodialysis (HCC)   Pleural effusion   Elevated troponin    Leg pain   Leukocytosis   History of bacteremia  Lumbar/sacral osteomyelitis/discitis, L5-S1, Septic arthritis L5-S1, and on admission, left psoas abscess s/p IR aspiration Patient presented with worsening lumbar back pain with radiation of pain.  History of MSSA bacteremia on 03/13/2021.  Currently afebrile with no leukocytosis. MRI of the lumbosacral spine on 03/16/21 was repeated which was notable for L5-S1 discitis with osteomyelitis with left psoas abscess.  IR drained 60 mL of purulent fluid from the left psoas region.  Neurosurgery and infectious disease followed the patient during hospitalization.  Repeat blood cultures have been negative patient is afebrile without leukocytosis.  Fluid culture from aspiration showed Staph aureus sensitive to all antibiotics.   Plan to continue cefazolin with hemodialysis for 6 to 8 weeks.  Tentative end date of antibiotics 04/30/2021 as per infectious disease.  Intractable back pain.   Remains quite debilitating back pain.  Unable to sit up for dialysis.   Fentanyl 50 mcg/h patch, dose increased on 8/22. Add Flexeril 5 mg 3 times daily scheduled.   Norco 10 mg every 4 hours, use more often.  Lidocaine patch and diclofenac patch.  Use pain medications before mobility. Still with debilitating pain, will watch another day and increase dose of fentanyl patch.  ESRD on HD with hyperkalemia.:  Continue hemodialysis as per nephrology.  Now on a schedule Monday Wednesday Friday.  Diabetes mellitus type 2:  Latest hemoglobin A1c of 5.1 on 01/16/2021.  Diet controlled.   History of MSSA bacteremia:  Has previously completed 6 weeks of antibiotic.   On IV cefazolin at this time.  Plan to continue IV antibiotic for 6  to 8 weeks as per ID recommendation.   Anemia chronic kidney disease:  Baseline hemoglobin of around 8.0.  Hemoglobin is stable.  Subclinical hypothyroidism:  T4 normal.  TSH elevated.  Follow-up in 4 to 6 weeks  Weakness/deconditioning/gait  disturbance: Physical therapy on board and recommending nursing facility placement.    DVT prophylaxis: heparin injection 5,000 Units Start: 03/15/21 1400   CODE STATUS-full code  Family Communication:   Patient's son Mr. Irving at the bedside 8/22.  Disposition Plan:   Level of care: Med-Surg  Status is: Inpatient  Remains inpatient appropriate because:  IV treatments appropriate due to intensity of illness or inability to take PO, and Inpatient level of care appropriate due to severity of illness, awaiting for placement.  Dispo: The patient is from: Home              Anticipated d/c is to: Likely to skilled nursing facility once pain is controlled and able to sit up.              Patient currently is not medically stable to d/c.   Difficult to place patient No  Consultants:  Neurosurgery Nephrology Infectious disease Interventional radiology  Procedures:  IR guided aspiration of the left psoas abscess on 03/19/2021 with removal of 60 mL purulent fluid. Hemodialysis.  Antimicrobials:  Ceftriaxone 8/4 - 8/6 Cefazolin 8/6>>  Subjective: Seen and examined.  He was eating breakfast with slight head up position.  Occupational Therapy started working with him, he had severe pain and spasm of the back and psoas back of the thigh.  He could not sit up or move.   Objective: Vitals:   04/03/21 0700 04/03/21 0841 04/03/21 1100 04/03/21 1330  BP: 100/61  110/62 (!) 95/58  Pulse: 69 74 66 (!) 59  Resp: '12 14 14 14  '$ Temp: 99.2 F (37.3 C)  98 F (36.7 C) 98.1 F (36.7 C)  TempSrc: Axillary  Axillary Axillary  SpO2: 95%     Weight:      Height:       No intake or output data in the 24 hours ending 04/03/21 1352   Filed Weights   03/30/21 0600 04/02/21 0815  Weight: 62.6 kg 65 kg   Physical examination:  General: Looks fairly comfortable on lying still.  Mildly anxious and in moderate distress on attempted to sit. Cardiovascular: S1-S2 normal.  No  murmurs. Respiratory: Bilateral clear.  No added sounds. Gastrointestinal: Soft and nontender.  Bowel sounds present. Ext: No cyanosis or edema.  Left upper extremity AV fistula present.  Data Reviewed: I have personally reviewed the following labs and imaging studies   CBC: Recent Labs  Lab 03/29/21 0113 03/30/21 0214 04/01/21 0308 04/02/21 0830  WBC 15.4* 9.0 6.1 6.9  HGB 8.8* 9.0* 9.1* 8.7*  HCT 29.5* 29.8* 31.3* 29.4*  MCV 98.0 99.3 101.3* 101.4*  PLT 347 361 382 [id number]   Basic Metabolic Panel: Recent Labs  Lab 03/29/21 0113 03/30/21 0214 03/31/21 0300 04/01/21 0308 04/02/21 0414 04/02/21 0830 04/03/21 0138  NA 130*   < > 130* 132* 132* 134* 135  K 6.2*   < > 5.0 4.5 4.9 5.2* 4.6  CL 90*   < > 90* 95* 94* 96* 94*  CO2 30   < > '30 29 30 29 '$ 33*  GLUCOSE 141*   < > 137* 118* 132* 111* 106*  BUN 57*   < > 43* 30* 41* 43* 22  CREATININE 8.39*   < > 6.75* 4.53*  6.28* 6.60* 4.28*  CALCIUM 9.4   < > 9.4 9.2 9.7 9.8 9.2  MG 2.9*  --   --   --   --   --   --   PHOS 2.8   < > 3.1 2.7 2.7 2.7 2.2*   < > = values in this interval not displayed.   GFR: Estimated Creatinine Clearance: 13.1 mL/min (A) (by C-G formula based on SCr of 4.28 mg/dL (H)). Liver Function Tests: Recent Labs  Lab 03/31/21 0300 04/01/21 0308 04/02/21 0414 04/02/21 0830 04/03/21 0138  ALBUMIN 2.5* 2.5* 2.5* 2.5* 2.7*   No results for input(s): LIPASE, AMYLASE in the last 168 hours. No results for input(s): AMMONIA in the last 168 hours. Coagulation Profile: No results for input(s): INR, PROTIME in the last 168 hours.  Cardiac Enzymes: No results for input(s): CKTOTAL, CKMB, CKMBINDEX, TROPONINI in the last 168 hours. BNP (last 3 results) No results for input(s): PROBNP in the last 8760 hours. HbA1C: No results for input(s): HGBA1C in the last 72 hours. CBG: Recent Labs  Lab 03/29/21 0250  GLUCAP 136*   Lipid Profile: No results for input(s): CHOL, HDL, LDLCALC, TRIG, CHOLHDL, LDLDIRECT  in the last 72 hours. Thyroid Function Tests: No results for input(s): TSH, T4TOTAL, FREET4, T3FREE, THYROIDAB in the last 72 hours.  Anemia Panel: No results for input(s): VITAMINB12, FOLATE, FERRITIN, TIBC, IRON, RETICCTPCT in the last 72 hours. Sepsis Labs: No results for input(s): PROCALCITON, LATICACIDVEN in the last 168 hours.  No results found for this or any previous visit (from the past 240 hour(s)).     Radiology Studies: No results found.    Scheduled Meds:  Chlorhexidine Gluconate Cloth  6 each Topical Q0600   cyclobenzaprine  5 mg Oral TID   darbepoetin (ARANESP) injection - DIALYSIS  100 mcg Intravenous Q Mon-HD   diclofenac  1 patch Transdermal BID   doxercalciferol  3 mcg Intravenous Q M,W,F-HD   feeding supplement (NEPRO CARB STEADY)  237 mL Oral BID BM   fentaNYL  1 patch Transdermal Q72H   gabapentin  100 mg Oral TID   heparin  5,000 Units Subcutaneous Q8H   lidocaine  1 patch Transdermal Q24H   midodrine  10 mg Oral Q M,W,F-HD   multivitamin  1 tablet Oral QHS   polyethylene glycol  17 g Oral BID   senna-docusate  2 tablet Oral BID   sodium chloride flush  3 mL Intravenous Q12H   sucroferric oxyhydroxide  1,000 mg Oral TID WC   Continuous Infusions:   ceFAZolin (ANCEF) IV 1 g (04/02/21 2205)     LOS: 19 days   Total time spent: 25 minutes  Barb Merino, MD [facility] 04/03/2021, 1:52 PM

## 2021-04-03 NOTE — Progress Notes (Signed)
Physical Therapy Treatment Patient Details Name: Ryan Wells MRN: [id number] DOB: Dec 04, 1955 Today's Date: 04/03/2021    History of Present Illness Pt is 65 y.o. male presenting to ED on 8/3 with severe back/leg pain x2 weeks and diaphoresis.  Pt admitted with lumbar sacral osteomyelitis/discitis with septic arthritis L5-S1 and bil psoas abscess, R sided pleural effusion. . 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 At time of previous admission MRI (+) L5-S1 facet osteoarthritis, synovial cysts and periarticular soft tissue edema on the left. 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 Returned to ED 7/25 c/o radicular low back pain. 8/8 CT guided aspiration of left psoas abscess   PMHx significant for ESRD on HD, HTN, DMII, chornic anemia and MSSA.    PT Comments    Pt making significant progress this afternoon.  Pt has significant improvement in pain -even compared to morning treatment with OT.  Last medication received was ~8:45 , but despite meds >6 hours ago he was still able to sit EOB with min A and not scream in pain. Pt even ambulated 150' with platform walker and min A with chair follow.  Unsure of what led to such a drastic improvement in pain, but pt grateful and pleased to be ambulating. Do strongly still recommend SNF , due to confusion, safety awareness, still requiring min A and cues with knee buckling at times, and to make sure this level of activity/pain control remain consistent for pt (this level of activity/pain control was one time after weeks of severe pain and screaming with just sitting - hopefully continues to have good pain control).   Follow Up Recommendations  SNF     Equipment Recommendations  Rolling walker with 5" wheels    Recommendations for Other Services       Precautions / Restrictions Precautions Precautions: Fall Precaution Comments: Back pain - back precautions for  comfort Required Braces or Orthoses: Spinal Brace (Has in room - for comfort (did not use today))    Mobility  Bed Mobility Overal bed mobility: Needs Assistance Bed Mobility: Rolling;Sidelying to Sit Rolling: Supervision       Sit to sidelying: Min assist General bed mobility comments: Able to roll both sides with use of rails; light min A to sit and scoot forward    Transfers Overall transfer level: Needs assistance Equipment used:  (upright rollator) Transfers: Sit to/from Stand Sit to Stand: Min assist;+2 safety/equipment         General transfer comment: Performed sit to stand x 3 with min A of 2 for safety and cues for arm placement on platforms on upright rollator  Ambulation/Gait Ambulation/Gait assistance: Min assist;+2 safety/equipment Gait Distance (Feet): 150 Feet (8' then 150') Assistive device:  (upright rollator) Gait Pattern/deviations: Step-through pattern;Narrow base of support;Decreased stride length Gait velocity: decreased   General Gait Details: Requiring min A for safety and chair follow.  Legs buckling initially but improved once out in hallway.  Cues for safety and to increase BOS (initially narrow BOS with scissoring at times)   Stairs             Wheelchair Mobility    Modified Rankin (Stroke Patients Only)       Balance Overall balance assessment: Needs assistance Sitting-balance support: Bilateral upper extremity supported;Feet supported Sitting balance-Leahy Scale: Fair Sitting balance - Comments: Uses UE for pain control   Standing balance support: Bilateral upper extremity supported Standing balance-Leahy Scale: Poor Standing balance comment: Requiring  walker and min A                            Cognition Arousal/Alertness: Awake/alert Behavior During Therapy: WFL for tasks assessed/performed Overall Cognitive Status: No family/caregiver present to determine baseline cognitive functioning                                  General Comments: No family present to determine baseline.  He does follow basic commands and single step commands, unable to follow multistep commands.  He required increased time to state year and month.  Unable to name 3 animals, was able to count up by 3's      Exercises General Exercises - Lower Extremity Ankle Circles/Pumps: AROM;Both;10 reps;Supine Heel Slides: AROM;Both;10 reps;Supine Hip ABduction/ADduction: AROM;Both;10 reps;Supine    General Comments General comments (skin integrity, edema, etc.): MD in at end of treatment.  Pt encouraged to sit up in chair and was able to verbalize how to call for assist back to bed.      Pertinent Vitals/Pain Pain Assessment: 0-10 Pain Score: 7  Pain Location: back Pain Descriptors / Indicators: Sore;Discomfort Pain Intervention(s): Limited activity within patient's tolerance;Monitored during session    Home Living                      Prior Function            PT Goals (current goals can now be found in the care plan section) Progress towards PT goals: Progressing toward goals    Frequency    Min 3X/week      PT Plan Current plan remains appropriate    Co-evaluation              AM-PAC PT "6 Clicks" Mobility   Outcome Measure  Help needed turning from your back to your side while in a flat bed without using bedrails?: A Little Help needed moving from lying on your back to sitting on the side of a flat bed without using bedrails?: A Little Help needed moving to and from a bed to a chair (including a wheelchair)?: A Little Help needed standing up from a chair using your arms (e.g., wheelchair or bedside chair)?: A Little Help needed to walk in hospital room?: A Little Help needed climbing 3-5 steps with a railing? : A Lot 6 Click Score: 17    End of Session Equipment Utilized During Treatment: Gait belt Activity Tolerance: Patient tolerated treatment well Patient left: with  chair alarm set;in chair;with call bell/phone within reach Nurse Communication: Mobility status PT Visit Diagnosis: Difficulty in walking, not elsewhere classified (R26.2);Pain     Time: 1425-1459 PT Time Calculation (min) (ACUTE ONLY): 34 min  Charges:  $Gait Training: 8-22 mins $Therapeutic Exercise: 8-22 mins                     Abran Richard, PT Acute Rehab Services Pager [phone] [facility] [place] 04/03/2021, 3:34 PM

## 2021-04-03 NOTE — Consult Note (Signed)
   [facility] Inpatient Consult   04/03/2021  Ryan Wells Jul 13, 1956 [id number]  [facility] [ACO] Patient: [employer]  Follow up:  LLOS 19 days  Reviewed for disposition follow up and patient is being recommended for a skilled nursing facility level of care.  Plan:  No [facility] Care Management follow up as post hospital transition of care needs are to be met at a skilled nursing facility level of care.  For questions,  Natividad Brood, RN BSN [facility] Liaison  [phone] business mobile phone Toll free office [phone]  Fax number: [phone] [name]

## 2021-04-03 NOTE — Progress Notes (Signed)
Patient spoke with interpreter and pt requested pureed foods due to difficulty swallowing related to poor dentition. Informed nurse. I spoke with the kitchen staff and they will send up pureed food for dinner. I recommend a speech pathologist consult due to difficulty swallowing.

## 2021-04-03 NOTE — Therapy (Signed)
Occupational Therapy Treatment Patient Details Name: Ryan Wells MRN: [id number] DOB: [birth year] Today's Date: 04/03/2021    History of present illness Pt is 65 y.o. male presenting to ED on 8/3 with severe back/leg pain x2 weeks and diaphoresis.  Pt admitted with lumbar sacral osteomyelitis/discitis with septic arthritis L5-S1 and bil psoas abscess, R sided pleural effusion. . 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 At time of previous admission MRI (+) L5-S1 facet osteoarthritis, synovial cysts and periarticular soft tissue edema on the left. 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 Returned to ED 7/25 c/o radicular low back pain. 8/8 CT guided aspiration of left psoas abscess   PMHx significant for ESRD on HD, HTN, DMII, chornic anemia and MSSA.   OT comments  Pt seen for OT ADL retraining session today with focus on bed mobility, sitting up at EOB, and sit to stand. Also attempted transfer to recliner chair for simulation of 3:1 transfers and increased participation in ADL's and functional mobility but pt was unable secondary to 8/10 pain in bilateral quads/thighs. Pt was premedicated prior to this session and interpreter was present throughout. Pt was educated in don/doff of back brace in sitting but was unable to assist. He had difficulty following 1 step commands and reported double vision, RN was made aware. Pt has met 0/4 goals. Recommend SNF at this time.   Follow Up Recommendations  Supervision/Assistance - 24 hour;SNF    Equipment Recommendations  Other (comment) (Defer to next venue)    Recommendations for Other Services      Precautions / Restrictions Precautions Precautions: Fall Precaution Comments: Back pain - back precautions for comfort Required Braces or Orthoses: Spinal Brace Spinal Brace: Applied in sitting position;Thoracolumbosacral orthotic (Brace in room) Restrictions Weight Bearing  Restrictions: No       Mobility Bed Mobility Overal bed mobility: Needs Assistance Bed Mobility: Rolling;Sidelying to Sit;Sit to Sidelying Rolling: Min assist;Min guard Sidelying to sit: Min assist;Mod assist;+2 for physical assistance     Sit to sidelying: Mod assist;+2 for physical assistance General bed mobility comments: Pt rolled to right w/o a lot of assist and was able to advance LE's to EOB Mod I level. He required Mod assist to elevate trunk to seated position x2    Transfers Overall transfer level: Needs assistance Equipment used: Rolling walker (2 wheeled) Transfers: Sit to/from Stand Sit to Stand: Min assist;From elevated surface;Mod assist         General transfer comment: Pt was min A to power up from EOB to standing this date using RW. Pt was noted to sit abruptly and demonstrated difficulty following 1 step commands for sitting back at EOB and repositioning secondary to pain in bilateral thighs/quads per his report. Pt did not adhere to back precautions. Pain is limiting factor. Inperpreter present throughout session and is known to this pt. Interpreter reports change in cognition since last visit approximately 1 week ago, RN made aware of pt reports of double vision, difficulty following 1 step commands and pain 8/10. ?if this is related to medications as pt was premedicated prior to this session.    Balance Overall balance assessment: Needs assistance Sitting-balance support: Bilateral upper extremity supported;Feet supported Sitting balance-Leahy Scale: Fair Sitting balance - Comments: leans toward R to offload L hip; UE support for pain control Postural control: Right lateral lean Standing balance support: Bilateral upper extremity supported Standing balance-Leahy Scale: Poor Standing balance comment: Requiring walker and assist of 2  ADL either performed or assessed with clinical judgement   ADL Overall ADL's : Needs  assistance/impaired Eating/Feeding: Modified independent Eating/Feeding Details (indicate cue type and reason): Bed level, HOB elevated     Toilet Transfer: Moderate assistance;RW;Cueing for safety;BSC (Simulated sit to stand in preparation for transfer to 3:1) Toilet Transfer Details (indicate cue type and reason): gait belt and cues for safety and sequencing         Functional mobility during ADLs: Moderate assistance;+2 for physical assistance;Rolling walker;Cueing for safety;Cueing for sequencing General ADL Comments: Pt sitting up at  EOB x2 tin preparation for increased participation w/ ADL's. Pt however, required min support at times in sitting and reminders to bend forward and return to midline secondary to right lateral lean. Pt also educated in don/doff of back brace as issued on 04/02/21 for comfort. Pt w/ difficulty following 1 step commands despite assistance of interpreter throuhgout this session. He was premedicated for pain. (Sit to static stand with RW w/ Min assist x1 for toilet transfer simulation. Recommend +2 assist for ambulation)     Vision  Pt reports double vision when asked why he was shutting his eyes when sitting up at EOB. RN made aware     Perception     Praxis      Cognition Arousal/Alertness: Lethargic (? if this is due to medications. Pt states "I feel heavy" all over) Behavior During Therapy: Flat affect;Anxious Overall Cognitive Status: Impaired/Different from baseline Area of Impairment: Following commands       General Comments: ? if this is baseline, some distraction due to pain likely (Difficulty following commands 1 step w/ interpreter present throughout)              General Comments See general transfer comments above    Pertinent Vitals/ Pain       Pain Assessment: Faces Faces Pain Scale: Hurts whole lot Pain Location: Reposrts 8/10 in bilateral thigh/quads Pain Descriptors / Indicators:  Discomfort;Grimacing;Guarding;Moaning;Sharp Pain Intervention(s): Limited activity within patient's tolerance;Monitored during session;Premedicated before session;Repositioned   Frequency  Min 2X/week        Progress Toward Goals  OT Goals(current goals can now be found in the care plan section)  Progress towards OT goals: Not progressing toward goals - comment (Slow progression towards goals. Pain is limiting factor. 0/4 goals met - will extend)  Acute Rehab OT Goals Patient Stated Goal: Get back to bed Time For Goal Achievement: 04/17/21 Potential to Achieve Goals: [place] Discharge plan needs to be updated       AM-PAC OT "6 Clicks" Daily Activity     Outcome Measure   Help from another person eating meals?: None Help from another person taking care of personal grooming?: A Little Help from another person toileting, which includes using toliet, bedpan, or urinal?: A Lot Help from another person bathing (including washing, rinsing, drying)?: A Lot Help from another person to put on and taking off regular upper body clothing?: A Little Help from another person to put on and taking off regular lower body clothing?: A Lot 6 Click Score: 16    End of Session Equipment Utilized During Treatment: Gait belt;Rolling walker  OT Visit Diagnosis: Unsteadiness on feet (R26.81);Repeated falls (R29.6);Muscle weakness (generalized) (M62.81);Pain Pain - Right/Left:  (Bilateral thigh/quads in standing) Pain - part of body: Leg   Activity Tolerance Patient limited by pain   Patient Left in bed;with call bell/phone within reach;with bed alarm set   Nurse Communication Other (comment) (Pt pain  despite being premedicated, reports of double vision, difficulty following 1 step commands)        Time: 3524-8185 OT Time Calculation (min): 50 min  Charges: OT General Charges $OT Visit: 1 Visit OT Treatments $Self Care/Home Management : 8-22 mins $Therapeutic Activity: 23-37  mins   Arva Slaugh Beth Dixon, OTR/L 04/03/2021, 10:03 AM

## 2021-04-03 NOTE — Progress Notes (Signed)
Patient seems a little confused this afternoon. Patient was hard to arouse for lunch. And choking on food. He may have had an aspiration event yesterday or this morning. Alerted Dr. Sloan Leiter.

## 2021-04-03 NOTE — Progress Notes (Signed)
[facility] Progress Note  Subjective: no new c/o  Vitals:   04/03/21 0700 04/03/21 0841 04/03/21 1100 04/03/21 1330  BP: 100/61  110/62 (!) 95/58  Pulse: 69 74 66 (!) 59  Resp: '12 14 14 14  '$ Temp: 99.2 F (37.3 C)  98 F (36.7 C) 98.1 F (36.7 C)  TempSrc: Axillary  Axillary Axillary  SpO2: 95%     Weight:      Height:        Exam: General: NAD Heart: RRR Lungs: clear bilaterally Abdomen:Soft, non-tender, (+) bowel sounds Extremities:No edema BLLE Dialysis Access: L forearm AVF (+) Bruit/Thrill     [facility] HD: [facility]  3h 45mn  180NRe 400/600  64kg   2/2 bath  P4  AVF  Hep 2200 -Mircera 150 mcg IV q 2 week (last dose 150 mcg IV 02/28/2021) -Venofer 100 mg IV X 10 doses (4/6 doses given. 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  Neurosurgery evaluated and no intervention deemed necessary.  Seen by  ID, started cefazolin (given h/o MSSA bacteremia recently).  IR did CT guided aspiration of 60 cc on 8/8, site too small for drain placement per IR.  Per ID, Continue cefazolin with HD, plan for 6 to 8 weeks course. Tentative end date is 04/30/21. H/o MSSA bacteremia - had previously been rx'd w/ 6 wks IV abx for bacteremia w/ pulm septic emboli and negative TEE. Had LUA AVF pain and erythema but imaging was neg for abscess and symptoms resolved.  Completed abx course on 02/22/21. Here he is getting IV Ancef, per ID plan is for 6- 8 wks.  ESRD - Back on MWF schedule.  Dispostion - as an ESRD pt, he must be able to sit up in a recliner for dialysis to be considered a candidate for OP HD. Has not been able to do this yet. He has been exercising in his room lately. He will need rehab before he can be discharged to home for outpatient dialysis.  RN staff to work with him to see how much he can sit up. Hypertension/volume  - As noted above. BP  well controlled. Continue midodrine 10 mg PO TIW 30 prior to HD. May repeat mid HD if necessary. Stable vol on exam. Close to dry wt today, has been down as much as 6kg under here. May need EDW reset on discharge.   Anemia  - HGB 9.9.  Missed OP ESA 08/03. On aranesp 100 mcg q Monday here. Metabolic bone disease - Continue velphoro 500 mg 2 tabs PO TID AC. Continue VDRA. Labs at goal.  Nutrition - Renal carb mod diet, nepro, renal vits. DM-No meds on OP list. Last HA1c 6.9. per primary Intractable back pain: this is the main barrier to him sitting up/ rehabbing.   Disposition - SNF recommended.     Rob Aneesha Holloran 04/03/2021, 1:53 PM   Recent Labs  Lab 04/01/21 0308 04/02/21 0414 04/02/21 0830 04/03/21 0138  K 4.5   < > 5.2* 4.6  BUN 30*   < > 43* 22  CREATININE 4.53*   < > 6.60* 4.28*  CALCIUM 9.2   < > 9.8 9.2  PHOS 2.7   < > 2.7 2.2*  HGB 9.1*  --  8.7*  --    < > = values in this interval not displayed.    Inpatient  medications:  Chlorhexidine Gluconate Cloth  6 each Topical Q0600   cyclobenzaprine  5 mg Oral TID   darbepoetin (ARANESP) injection - DIALYSIS  100 mcg Intravenous Q Mon-HD   diclofenac  1 patch Transdermal BID   doxercalciferol  3 mcg Intravenous Q M,W,F-HD   feeding supplement (NEPRO CARB STEADY)  237 mL Oral BID BM   fentaNYL  1 patch Transdermal Q72H   gabapentin  100 mg Oral TID   heparin  5,000 Units Subcutaneous Q8H   lidocaine  1 patch Transdermal Q24H   midodrine  10 mg Oral Q M,W,F-HD   multivitamin  1 tablet Oral QHS   polyethylene glycol  17 g Oral BID   senna-docusate  2 tablet Oral BID   sodium chloride flush  3 mL Intravenous Q12H   sucroferric oxyhydroxide  1,000 mg Oral TID WC     ceFAZolin (ANCEF) IV 1 g (04/02/21 2205)   acetaminophen **OR** acetaminophen, albuterol, HYDROcodone-acetaminophen, hydrOXYzine

## 2021-04-03 NOTE — Plan of Care (Signed)

## 2021-04-04 ENCOUNTER — Inpatient Hospital Stay (HOSPITAL_COMMUNITY): Payer: Medicare HMO

## 2021-04-04 LAB — RENAL FUNCTION PANEL
Albumin: 2.6 g/dL — ABNORMAL LOW (ref 3.5–5.0)
Anion gap: 8 (ref 5–15)
BUN: 35 mg/dL — ABNORMAL HIGH (ref 8–23)
CO2: 33 mmol/L — ABNORMAL HIGH (ref 22–32)
Calcium: 9.9 mg/dL (ref 8.9–10.3)
Chloride: 90 mmol/L — ABNORMAL LOW (ref 98–111)
Creatinine, Ser: 6.37 mg/dL — ABNORMAL HIGH (ref 0.61–1.24)
GFR, Estimated: 9 mL/min — ABNORMAL LOW (ref >60)
Glucose, Bld: 112 mg/dL — ABNORMAL HIGH (ref 70–99)
Phosphorus: 2.4 mg/dL — ABNORMAL LOW (ref 2.5–4.6)
Potassium: 5 mmol/L (ref 3.5–5.1)
Sodium: 131 mmol/L — ABNORMAL LOW (ref 135–145)

## 2021-04-04 LAB — GLUCOSE, CAPILLARY: Glucose-Capillary: 116 mg/dL — ABNORMAL HIGH (ref 70–99)

## 2021-04-04 MED ORDER — DOXERCALCIFEROL 4 MCG/2ML IV SOLN
INTRAVENOUS | Status: AC
  Administered 2021-04-04: 3 ug via INTRAVENOUS
  Filled 2021-04-04: qty 2

## 2021-04-04 MED ORDER — MIDODRINE HCL 5 MG PO TABS
ORAL_TABLET | ORAL | Status: AC
  Administered 2021-04-04: 10 mg via ORAL
  Filled 2021-04-04: qty 2

## 2021-04-04 MED ORDER — CYCLOBENZAPRINE HCL 5 MG PO TABS
5.0000 mg | ORAL_TABLET | Freq: Three times a day (TID) | ORAL | Status: DC | PRN
  Filled 2021-04-04: qty 1

## 2021-04-04 NOTE — Evaluation (Signed)
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  Did not get outpatient stress testing following hospitalization.  No chest pain since   Chronic kidney disease    on dialysis Tues, Thurs and Sat   Diabetes mellitus without complication ([place])    Diabetes type 2, uncontrolled ([place]) 11/25/2011   no meds, diet controlled per patient   Diabetic gastroparesis ([place]) 01/22/2017   Hypertension    no meds   Microalbuminuria 01/19/2017   Myocardial infarction [facility]) 2015   Past Surgical History:  Past Surgical History:  Procedure Laterality Date   A/V FISTULAGRAM Left 06/04/2019   Procedure: A/V FISTULAGRAM;  Surgeon: Angelia Mould, MD;  Location: [place] [facility];  Service: Cardiovascular;  Laterality: Left;   A/V FISTULAGRAM Left 10/15/2019   Procedure: A/V FISTULAGRAM;  Surgeon: Angelia Mould, MD;  Location: [place] [facility];  Service: Cardiovascular;  Laterality: Left;   A/V FISTULAGRAM N/A 01/24/2021   Procedure: A/V FISTULAGRAM - Left Upper;  Surgeon: Cherre Robins, MD;  Location: [place] [facility];  Service: Cardiovascular;  Laterality: N/A;   AV FISTULA PLACEMENT Left 07/02/2018   Procedure: BRACHIOCEPHALIC ARTERIOVENOUS (AV) FISTULA CREATION LEFT ARM;  Surgeon: Serafina Mitchell, MD;  Location: [place];  Service: Vascular;  Laterality: Left;   [place] Left 07/26/2019   Procedure: Bascilic Vein Transposition;  Surgeon: Angelia Mould, MD;  Location: [place];  Service: Vascular;  Laterality:  Left;   COLONOSCOPY     EMBOLIZATION Left 06/04/2019   Procedure: EMBOLIZATION;  Surgeon: Angelia Mould, MD;  Location: [place] [facility];  Service: Cardiovascular;  Laterality: Left;  LT ARM FISTULA/COMPETING BRANCH   EYE SURGERY Bilateral    cataracts x2   EYE SURGERY     INSERTION OF DIALYSIS CATHETER Right 06/08/2019   Procedure: INSERTION OF PALINDROME DIALYSIS CATHETER IN RIGHT INTERNAL JUGULAR;  Surgeon: Angelia Mould, MD;  Location: [place];  Service: Vascular;  Laterality: Right;   LIGATION OF ARTERIOVENOUS  FISTULA Left 07/26/2019   Procedure: Ligation Of BrachioCephalic  Fistula;  Surgeon: Angelia Mould, MD;  Location: [place];  Service: Vascular;  Laterality: Left;   NECK SURGERY     PERIPHERAL VASCULAR BALLOON ANGIOPLASTY Left 06/04/2019   Procedure: PERIPHERAL VASCULAR BALLOON ANGIOPLASTY;  Surgeon: Angelia Mould, MD;  Location: [place] [facility];  Service: Cardiovascular;  Laterality: Left;  ARM FISTULA   PERIPHERAL VASCULAR BALLOON ANGIOPLASTY Left 10/15/2019   Procedure: PERIPHERAL VASCULAR BALLOON ANGIOPLASTY;  Surgeon: Angelia Mould, MD;  Location: [place] [facility];  Service: Cardiovascular;  Laterality: Left;  arm fistula   TEE WITHOUT CARDIOVERSION N/A 01/19/2021   Procedure: TRANSESOPHAGEAL ECHOCARDIOGRAM (TEE);  Surgeon: Jerline Pain, MD;  [facility] END[facility];  Service: Cardiovascular;  Laterality: N/A;   HPI:  Ryan Wells is a 65 y.o. male admitted with Lumbar/sacral osteomyelitis/discitis, L5-S1, Septic arthritis L5-S1, and on admission, left psoas abscess. Also found to have difficulty eating and drinking as well as chest x-ray significant for cardiomegaly with  vascular congestion, focal area with pulmonary nodularity of the right mid lung correlates with cavitating lesion and a small right-sided pleural effusion.  Pt with medical history of end-stage renal disease on hemodialysis, essential hypertension, diabetes  mellitus type 2, anemia of chronic kidney disease,   In prior admission in June 2022 pt was able to [facility] and self feed regular solids and thin liquids.   Assessment / Plan / Recommendation Clinical Impression  Pt demonstrates increased risk of aspiration in setting of lethargy, impulsivity, and poor dentition. Pt observed in best case scenario, upright in chair, self feeding with intermittent interaction to remain alert and to cue pt for safety. Despite these supports pt ate rapidly, packed his mouth with 1/2 of his meal, coughed with sips with mouth full and by end of meal was coughing with solids and seemed to complain of globus. Pt was able to verbalize awareness of problem after cues but did not fully correct. His teeth are very weak and pt cannot masticate well. Recommend pureed solids for now until arousal and awareness are more stable. Will also proceed with MBS tomorrow given frquent coughing with liquids. Suspect pt is at risk of aspiration of oral bacteria given the state of his dentition. SLP Visit Diagnosis: Dysphagia, oropharyngeal phase (R13.12)    Aspiration Risk  Moderate aspiration risk    Diet Recommendation Dysphagia 1 (Puree);Thin liquid   Liquid Administration via: Cup Medication Administration: Whole meds with puree Supervision: Patient able to self feed;Full supervision/cueing for compensatory strategies Compensations: Slow rate;Small sips/bites;Minimize environmental distractions;Lingual sweep for clearance of pocketing Postural Changes: Seated upright at 90 degrees;Remain upright for at least 30 minutes after po intake    Other  Recommendations Oral Care Recommendations: Oral care BID Other Recommendations: Have oral suction available   Follow up Recommendations        Frequency and Duration            Prognosis        Swallow Study   General HPI: Ryan Wells is a 65 y.o. male admitted with Lumbar/sacral osteomyelitis/discitis, L5-S1, Septic  arthritis L5-S1, and on admission, left psoas abscess. Also found to have difficulty eating and drinking as well as chest x-ray significant for cardiomegaly with vascular congestion, focal area with pulmonary nodularity of the right mid lung correlates with cavitating lesion and a small right-sided pleural effusion.  Pt with medical history of end-stage renal disease on hemodialysis, essential hypertension, diabetes mellitus type 2, anemia of chronic kidney disease,   In prior admission in June 2022 pt was able to [facility] and self feed regular solids and thin liquids. Type of Study: Bedside Swallow Evaluation Previous Swallow Assessment: see HPI Diet Prior to this Study: Regular;Thin liquids Temperature Spikes Noted: No Respiratory Status: Room air History of Recent Intubation: No Behavior/Cognition: Lethargic/Drowsy;Cooperative;Pleasant mood Oral Cavity Assessment: Dry Oral Care Completed by SLP: No Oral Cavity - Dentition: Poor condition;Missing dentition Vision: Functional for self-feeding Self-Feeding Abilities: Able to feed self Patient Positioning: Upright in chair Baseline Vocal Quality: Normal Volitional Cough: Strong;Congested Volitional Swallow: Able to elicit    Oral/Motor/Sensory Function Overall Oral Motor/Sensory Function: Within functional limits   Ice Chips     Thin Liquid Thin Liquid: Impaired Presentation: Cup;Self Fed Pharyngeal  Phase Impairments: Cough - Immediate    Nectar Thick Nectar Thick Liquid: Not tested   Honey Thick Honey Thick Liquid: Not tested   Puree Puree: Not tested   Solid     Solid: Impaired Presentation: Self Fed Oral  Phase Impairments: Impaired mastication;Poor awareness of bolus Oral Phase Functional Implications: Prolonged oral transit;Left lateral sulci pocketing;Oral residue;Oral holding;Impaired mastication Pharyngeal Phase Impairments: Cough - Immediate      Taylyn Brame, Katherene Ponto 04/04/2021,10:48 AM

## 2021-04-04 NOTE — Progress Notes (Signed)
[facility] Progress Note  Subjective: no new c/o  Vitals:   04/04/21 0626 04/04/21 1100 04/04/21 1114 04/04/21 1130  BP: 125/63 120/60 115/62 (!) 98/52  Pulse: 68 66 66 64  Resp: '16 16  16  '$ Temp: 99 F (37.2 C)  98.1 F (36.7 C)   TempSrc: Oral  Oral   SpO2: 95% 96%    Weight:  65 kg    Height:        Exam: General: NAD Heart: RRR Lungs: clear bilaterally Abdomen:Soft, non-tender, (+) bowel sounds Extremities:No edema BLLE Dialysis Access: L forearm AVF (+) Bruit/Thrill     [facility] HD: [facility]  3h 29mn  180NRe 400/600  64kg   2/2 bath  P4  AVF  Hep 2200 -Mircera 150 mcg IV q 2 week (last dose 150 mcg IV 02/28/2021) -Venofer 100 mg IV X 10 doses (4/6 doses given. 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 IR did CT guided aspiration of 60 cc on 8/8, site too small for drain placement per IR.  Per ID, Continue cefazolin with HD, plan for 6 to 8 weeks course. Tentative end date is 04/30/21. H/o MSSA bacteremia - had previously been rx'd w/ 6 wks IV abx for bacteremia w/ pulm septic emboli and negative TEE. Had LUA AVF pain and erythema but imaging was neg for abscess and symptoms resolved.  Completed abx course on 02/22/21. Here he is getting IV Ancef again, per ID plan is for 6- 8 wks.  ESRD - Back on MWF schedule. HD today.  Dispostion - as an ESRD pt, he must be able to sit up in a recliner for dialysis to be considered a candidate for OP HD. Has not been able to do this yet. He has been exercising in his room lately.  Hypertension/volume  - As noted above. BP well controlled. Continue midodrine 10 mg PO TIW pre HD. Stable vol on exam. Close to dry wt now, was down as much as 6kg under here.    Anemia  - HGB 9.9.  Missed OP ESA 08/03. On aranesp 100 mcg q Monday here. Metabolic bone disease - Continue velphoro 500 mg 2 tabs PO TID  AC. Continue VDRA. Labs at goal.  Nutrition - Renal carb mod diet, nepro, renal vits. DM-No meds on OP list. Last HA1c 6.9. per primary Intractable back pain: this is the main barrier to him sitting up/ rehabbing.   Disposition - SNF recommended.     Rob Shaunda Tipping 04/04/2021, 11:50 AM   Recent Labs  Lab 04/01/21 0308 04/02/21 0414 04/02/21 0830 04/03/21 0138 04/04/21 0540  K 4.5   < > 5.2* 4.6 5.0  BUN 30*   < > 43* 22 35*  CREATININE 4.53*   < > 6.60* 4.28* 6.37*  CALCIUM 9.2   < > 9.8 9.2 9.9  PHOS 2.7   < > 2.7 2.2* 2.4*  HGB 9.1*  --  8.7*  --   --    < > = values in this interval not displayed.    Inpatient medications:  Chlorhexidine Gluconate Cloth  6 each Topical Q0600   cyclobenzaprine  5 mg Oral TID   darbepoetin (ARANESP) injection - DIALYSIS  100 mcg Intravenous Q Mon-HD   diclofenac  1 patch Transdermal BID   doxercalciferol  3 mcg Intravenous Q M,W,F-HD   feeding supplement (NEPRO  CARB STEADY)  237 mL Oral BID BM   gabapentin  100 mg Oral TID   heparin  5,000 Units Subcutaneous Q8H   lidocaine  1 patch Transdermal Q24H   midodrine  10 mg Oral Q M,W,F-HD   multivitamin  1 tablet Oral QHS   polyethylene glycol  17 g Oral BID   senna-docusate  2 tablet Oral BID   sodium chloride flush  3 mL Intravenous Q12H   sucroferric oxyhydroxide  1,000 mg Oral TID WC     ceFAZolin (ANCEF) IV 1 g (04/03/21 2308)   acetaminophen **OR** acetaminophen, albuterol, HYDROcodone-acetaminophen, hydrOXYzine

## 2021-04-04 NOTE — TOC Progression Note (Addendum)
Transition of Care [facility]) - Progression Note    Patient Details  Name: QUADRI PERDOMO MRN: [id number] Date of Birth: 02/01/1956  Transition of Care [facility]) CM/SW Contact  Joanne Chars, LCSW Phone Number: 04/04/2021, 8:52 AM  Clinical Narrative:   Teodora Medici, renal navigator.  He was not aware of this pt, discussed DC to [facility], unsure date at this point. He will start working on HD.    0930: TC Linus Orn.  Pt active at [facility] [place] arrival.  CSW spoke with Rolla Plate at [place] regarding their bed offer and HD needs.  She will confirm that they have contract with [facility] and, if so, they can transport.    1000: TC [facility].  They do have contract with [facility], can accept pt once he is ready. Question about the medications that are showing as being given by the HD clinic: aranesp, hectoral, proamatine, and whether these will continue to be given at HD.  1025: TC Greg D. [facility] has aranesp and hectoral, but not proamatine. [facility] informed and this works for them.    1450: CSW spoke with HD staff.  Pt completed entire HD today sitting up in chair without issue.  CSW spoke with Rolla Plate at [place] and she will initiate SNF auth.      Expected Discharge Plan: IP Rehab Facility Barriers to Discharge: Other (must enter comment), Continued Medical Work up (ability to sit for HD)  Expected Discharge Plan and Services Expected Discharge Plan: [place]     Post Acute Care Choice: IP Rehab Living arrangements for the past 2 months: Single Family Home                                       Social Determinants of Health (SDOH) Interventions    Readmission Risk Interventions No flowsheet data found.

## 2021-04-04 NOTE — Progress Notes (Signed)
PROGRESS NOTE    Ryan Wells  [id number] DOB: 07/13/1956 DOA: 03/14/2021 PCP: Pcp, No    Brief Narrative:   Ryan Wells is a 65 y.o. male with medical history of end-stage renal disease on hemodialysis, essential hypertension, diabetes mellitus type 2, anemia of chronic kidney disease,  presented to the hospital with back and leg pain worsening for 2 weeks.  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  He had completed 6 weeks of IV antibiotics with dialysis per ID notes from 8/2.  Patient continued to have worsening symptoms so he came back to the ED.  On this admission, in the emergency department patient was seen to be afebrile with stable vital signs.  Laboratory data showed leukocytosis.  Chest x-ray significant for cardiomegaly with vascular congestion, focal area with pulmonary nodularity of the right mid lung correlates with cavitating lesion and a small right-sided pleural effusion.  Patient was then admitted to the hospital for further evaluation and treatment.  During hospitalization, patient persisted to have lower back pain.  He has been  adjusted on  pain medication regimen with variable control of his pain. Currently, awaiting for skilled nursing facility/CIR placement but being limited by ability to tolerate the hemodialysis chair.   Principal Problem:   Lumbar back pain Active Problems:   Subclinical hypothyroidism   Anemia due to chronic kidney disease   Diabetes mellitus type II, non insulin dependent (HCC)   ESRD on hemodialysis (HCC)   Pleural effusion   Elevated troponin    Leg pain   Leukocytosis   History of bacteremia  Lumbar/sacral osteomyelitis/discitis, L5-S1, Septic arthritis L5-S1, and on admission, left psoas abscess s/p IR aspiration Patient presented with worsening lumbar back pain with radiation of pain.  History of MSSA bacteremia on 03/13/2021.  Currently afebrile with no leukocytosis. MRI of the lumbosacral spine on 03/16/21 was repeated which was notable for L5-S1 discitis with osteomyelitis with left psoas abscess.  IR drained 60 mL of purulent fluid from the left psoas region.  Neurosurgery and infectious disease followed the patient during hospitalization.  Repeat blood cultures have been negative patient is afebrile without leukocytosis.  Fluid culture from aspiration showed Staph aureus sensitive to all antibiotics.   Plan to continue cefazolin with hemodialysis for 6 to 8 weeks.  Tentative end date of antibiotics 04/30/2021 as per infectious disease.  Intractable back pain.   Patient remained pretty debilitated with severe back pain and spasm and unable to sit up for dialysis. 8/22, fentanyl patch increased to 50 mcg/h, Flexeril 5 mg 3 times daily and as needed Norco. 8/23, patient had excellent response and he walked in the hallway but became more sedated. 8/24, denies any pain.  He looks more sedated.  Will discontinue fentanyl patch.  Decrease Flexeril doses to as needed doses.  Norco 5 mg every 4 hours as needed. Hopefully can tolerate sitting up in the dialysis chair today.   ESRD on HD with hyperkalemia.:  Continue hemodialysis as per nephrology.  Now on a schedule Monday Wednesday Friday.  Diabetes mellitus type 2:  Latest hemoglobin A1c of 5.1 on 01/16/2021.  Diet controlled.   History of MSSA bacteremia:  Has previously completed 6 weeks of antibiotic.   On IV cefazolin at this time.  Plan to continue IV antibiotic for 6 to 8 weeks as per ID recommendation.   Anemia chronic kidney disease:  Baseline hemoglobin of around 8.0.  Hemoglobin  is stable.  Subclinical hypothyroidism:  T4 normal.  TSH elevated.  Follow-up in 4 to 6 weeks  Weakness/deconditioning/gait disturbance: Physical therapy on board and recommending nursing facility placement.    DVT prophylaxis: heparin injection 5,000 Units Start: 03/15/21 1400   CODE STATUS-full code  Family Communication:   Patient's son Mr. Geralyn Flash on the phone.  Disposition Plan:   Level of care: Med-Surg  Status is: Inpatient  Remains inpatient appropriate because:  IV treatments appropriate due to intensity of illness or inability to take PO, and Inpatient level of care appropriate due to severity of illness, awaiting for placement.  Dispo: The patient is from: Home              Anticipated d/c is to: Likely to skilled nursing facility once pain is controlled and able to sit up for HD.              Patient currently is not medically stable to d/c.   Difficult to place patient No  Consultants:  Neurosurgery Nephrology Infectious disease Interventional radiology  Procedures:  IR guided aspiration of the left psoas abscess on 03/19/2021 with removal of 60 mL purulent fluid. Hemodialysis.  Antimicrobials:  Ceftriaxone 8/4 - 8/6 Cefazolin 8/6>>  Subjective: Patient seen and examined.  Since yesterday morning, he was pain-free.  Surprisingly she walked in the hallway yesterday evening and today morning again.  Remained drowsy but pain-free. Will work on decreasing dose of Flexeril. He was sitting up in the chair after working with therapist today.   Objective: Vitals:   04/04/21 1200 04/04/21 1230 04/04/21 1300 04/04/21 1330  BP: (!) 104/52 102/61 104/60 120/61  Pulse: 66 68 68 66  Resp: '16 16 16 16  '$ Temp:      TempSrc:      SpO2:      Weight:      Height:        Intake/Output Summary (Last 24 hours) at 04/04/2021 1407 Last data filed at 04/03/2021 2311 Gross per 24 hour  Intake --  Output 0 ml  Net 0 ml     Filed Weights   04/02/21 0815 04/04/21 1100   Weight: 65 kg 65 kg   Physical examination:  General: Debilitated and sick looking gentleman not in any distress.  Responds but fairly drowsy today. Cardiovascular: S1-S2 normal.  Regular. Respiratory: Bilateral clear.  No added sounds. Gastrointestinal: Soft.  Nontender.  Bowel sounds present.   Ext: No cyanosis or edema.  Left upper extremity AV fistula present.    Data Reviewed: I have personally reviewed the following labs and imaging studies   CBC: Recent Labs  Lab 03/29/21 0113 03/30/21 0214 04/01/21 0308 04/02/21 0830  WBC 15.4* 9.0 6.1 6.9  HGB 8.8* 9.0* 9.1* 8.7*  HCT 29.5* 29.8* 31.3* 29.4*  MCV 98.0 99.3 101.3* 101.4*  PLT 347 361 382 [id number]   Basic Metabolic Panel: Recent Labs  Lab 03/29/21 0113 03/30/21 0214 04/01/21 0308 04/02/21 0414 04/02/21 0830 04/03/21 0138 04/04/21 0540  NA 130*   < > 132* 132* 134* 135 131*  K 6.2*   < > 4.5 4.9 5.2* 4.6 5.0  CL 90*   < > 95* 94* 96* 94* 90*  CO2 30   < >  $'29 30 29 'M$ 33* 33*  GLUCOSE 141*   < > 118* 132* 111* 106* 112*  BUN 57*   < > 30* 41* 43* 22 35*  CREATININE 8.39*   < > 4.53* 6.28* 6.60* 4.28* 6.37*  CALCIUM 9.4   < > 9.2 9.7 9.8 9.2 9.9  MG 2.9*  --   --   --   --   --   --   PHOS 2.8   < > 2.7 2.7 2.7 2.2* 2.4*   < > = values in this interval not displayed.   GFR: Estimated Creatinine Clearance: 8.8 mL/min (A) (by C-G formula based on SCr of 6.37 mg/dL (H)). Liver Function Tests: Recent Labs  Lab 04/01/21 0308 04/02/21 0414 04/02/21 0830 04/03/21 0138 04/04/21 0540  ALBUMIN 2.5* 2.5* 2.5* 2.7* 2.6*   No results for input(s): LIPASE, AMYLASE in the last 168 hours. No results for input(s): AMMONIA in the last 168 hours. Coagulation Profile: No results for input(s): INR, PROTIME in the last 168 hours.  Cardiac Enzymes: No results for input(s): CKTOTAL, CKMB, CKMBINDEX, TROPONINI in the last 168 hours. BNP (last 3 results) No results for input(s): PROBNP in the last 8760 hours. HbA1C: No  results for input(s): HGBA1C in the last 72 hours. CBG: Recent Labs  Lab 03/29/21 0250 04/04/21 0936  GLUCAP 136* 116*   Lipid Profile: No results for input(s): CHOL, HDL, LDLCALC, TRIG, CHOLHDL, LDLDIRECT in the last 72 hours. Thyroid Function Tests: No results for input(s): TSH, T4TOTAL, FREET4, T3FREE, THYROIDAB in the last 72 hours.  Anemia Panel: No results for input(s): VITAMINB12, FOLATE, FERRITIN, TIBC, IRON, RETICCTPCT in the last 72 hours. Sepsis Labs: No results for input(s): PROCALCITON, LATICACIDVEN in the last 168 hours.  No results found for this or any previous visit (from the past 240 hour(s)).     Radiology Studies: No results found.    Scheduled Meds:  Chlorhexidine Gluconate Cloth  6 each Topical Q0600   darbepoetin (ARANESP) injection - DIALYSIS  100 mcg Intravenous Q Mon-HD   diclofenac  1 patch Transdermal BID   doxercalciferol  3 mcg Intravenous Q M,W,F-HD   feeding supplement (NEPRO CARB STEADY)  237 mL Oral BID BM   gabapentin  100 mg Oral TID   heparin  5,000 Units Subcutaneous Q8H   lidocaine  1 patch Transdermal Q24H   midodrine  10 mg Oral Q M,W,F-HD   multivitamin  1 tablet Oral QHS   polyethylene glycol  17 g Oral BID   senna-docusate  2 tablet Oral BID   sodium chloride flush  3 mL Intravenous Q12H   sucroferric oxyhydroxide  1,000 mg Oral TID WC   Continuous Infusions:   ceFAZolin (ANCEF) IV 1 g (04/03/21 2308)     LOS: 20 days   Total time spent: 25 minutes  Barb Merino, MD [facility] 04/04/2021, 2:07 PM

## 2021-04-04 NOTE — Progress Notes (Addendum)
Physical Therapy Treatment Patient Details Name: Ryan Wells MRN: [id number] DOB: [birth year] Today's Date: 04/04/2021    History of Present Illness Pt is 65 y.o. male presenting to ED on 8/3 with severe back/leg pain x2 weeks and diaphoresis.  Pt admitted with lumbar sacral osteomyelitis/discitis with septic arthritis L5-S1 and bil psoas abscess, R sided pleural effusion. . 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 At time of previous admission MRI (+) L5-S1 facet osteoarthritis, synovial cysts and periarticular soft tissue edema on the left. 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 Returned to ED 7/25 c/o radicular low back pain. 8/8 CT guided aspiration of left psoas abscess   PMHx significant for ESRD on HD, HTN, DMII, chornic anemia and MSSA.    PT Comments    Pt admitted with above diagnosis. Initially, pt difficult to arouse.  Took incr time to arouse pt and nurse and MD came in to assess pt as he was very lethargic. Pt also not completing sentences per interpreter.  Pt did not have issues with hemibody weakness and was checked for CVA symptoms and deemed that pt possibly not responding to meds well and that incr lethargy. Pt was able to ambulate with upright rollator with min assist to mod assist and cues due to knee buckling at times and poor postural stability. Followed with chair for safety.  Pt currently with functional limitations due to balance and endurance deficits. Pt will benefit from skilled PT to increase their independence and safety with mobility to allow discharge to the venue listed below.     BP in supine was 100/52.   BP once sitting 127/57.  BP incr to 146/76 after walk and 121/75 at end of treatment.   Follow Up Recommendations  SNF     Equipment Recommendations  Rolling walker with 5" wheels    Recommendations for Other Services       Precautions / Restrictions Precautions Precautions:  Fall Precaution Comments: Back pain - back precautions for comfort Required Braces or Orthoses: Spinal Brace (Has in room - for comfort (did not use today)) Spinal Brace: Applied in sitting position;Thoracolumbosacral orthotic (Brace in room) Restrictions Weight Bearing Restrictions: No    Mobility  Bed Mobility Overal bed mobility: Needs Assistance Bed Mobility: Rolling;Sidelying to Sit Rolling: Supervision Sidelying to sit: Min assist;Mod assist;+2 for physical assistance       General bed mobility comments: Pt extremely lethargic and clammy on arrival. Felt for pulse and pulse present.  BP 100/52.  Notified nursing who asked for NT to take blood sugar.  BS 116.  By this time, nursing was able to arouse pt with hard sternal rub. Pt took incr time to get oriented and MD came in as well. MD questions whether pt was overly medicated and he asked nurse to remove the fentanyl patch.  Nurse did so. Pt was then able to roll both sides with use of rails; Mod assist to come to sitting from sidelying.  light min A to sit and scoot forward.  Pt continued with slow responses at times and slow to move around but was able to get going.    Transfers Overall transfer level: Needs assistance Equipment used:  (upright rollator) Transfers: Sit to/from Stand Sit to Stand: Min assist;+2 safety/equipment         General transfer comment: Performed sit to stand with min A of 2 for safety and cues for arm placement on platforms on upright rollator  Ambulation/Gait Ambulation/Gait assistance: Min to mod assist;+2 safety/equipment Gait Distance (Feet): 150 Feet Assistive device:  (upright rollator) Gait Pattern/deviations: Step-through pattern;Narrow base of support;Decreased stride length;Trunk flexed Gait velocity: decreased Gait velocity interpretation: <1.31 ft/sec, indicative of household ambulator General Gait Details: Requiring min to mod A for safety and chair follow.  Legs buckling initially  but improved once out in hallway.  Cues for safety and to increase BOS (initially narrow BOS with scissoring at times).Leans forward as he fatigues and needed incr cues for upright posture as he leaned forward toward end of walk due to fatigue.   Stairs             Wheelchair Mobility    Modified Rankin (Stroke Patients Only)       Balance Overall balance assessment: Needs assistance Sitting-balance support: Bilateral upper extremity supported;Feet supported Sitting balance-Leahy Scale: Fair Sitting balance - Comments: Uses UE for pain control Postural control: Right lateral lean Standing balance support: Bilateral upper extremity supported Standing balance-Leahy Scale: Poor Standing balance comment: Requiring walker and min A                            Cognition Arousal/Alertness: Lethargic;Suspect due to medications Behavior During Therapy: Flat affect Overall Cognitive Status: No family/caregiver present to determine baseline cognitive functioning Area of Impairment: Following commands                       Following Commands: Follows one step commands with increased time       General Comments: No family present to determine baseline.  He does follow basic commands and single step commands, unable to follow multistep commands.  He required increased time to state year and month.  Unable to name 3 animals, was able to count up by 3's      Exercises General Exercises - Lower Extremity Ankle Circles/Pumps: AROM;Both;10 reps;Supine Long Arc Quad: AROM;Both;10 reps;Seated    General Comments General comments (skin integrity, edema, etc.): Nurse had called and asked PT to see early to get him up in HD chair.      Pertinent Vitals/Pain Pain Assessment: Faces Faces Pain Scale: Hurts little more Pain Location: back Pain Descriptors / Indicators: Discomfort Pain Intervention(s): Limited activity within patient's tolerance;Monitored during  session;Repositioned;Premedicated before session    Home Living                      Prior Function            PT Goals (current goals can now be found in the care plan section) Progress towards PT goals: Progressing toward goals    Frequency    Min 3X/week      PT Plan Current plan remains appropriate    Co-evaluation              AM-PAC PT "6 Clicks" Mobility   Outcome Measure  Help needed turning from your back to your side while in a flat bed without using bedrails?: A Little Help needed moving from lying on your back to sitting on the side of a flat bed without using bedrails?: A Little Help needed moving to and from a bed to a chair (including a wheelchair)?: A Little Help needed standing up from a chair using your arms (e.g., wheelchair or bedside chair)?: A Little Help needed to walk in hospital room?: A Little Help needed climbing 3-5 steps with a railing? :  A Lot 6 Click Score: 17    End of Session Equipment Utilized During Treatment: Gait belt Activity Tolerance: Patient tolerated treatment well Patient left: with chair alarm set;in chair;with call bell/phone within reach Nurse Communication: Mobility status PT Visit Diagnosis: Difficulty in walking, not elsewhere classified (R26.2);Pain Pain - Right/Left:  (central back; bil LEs) Pain - part of body:  (back)     Time: [id number] PT Time Calculation (min) (ACUTE ONLY): 54 min  Charges:  $Gait Training: 23-37 mins $Therapeutic Activity: 23-37 mins                     Shakita Keir M,PT Acute Rehab Services [phone] (pager)    Alvira Philips 04/04/2021, 1:30 PM

## 2021-04-04 NOTE — Progress Notes (Signed)
Out Patient Arrangements:  Pt is established @ [facility] [phone]) on their MWF shift w/ a time of 5:45 am.   Noted pt may go to rehab.   Linus Orn [facility] [phone]

## 2021-04-04 NOTE — Progress Notes (Signed)
Patient appears confused / disoriented and having difficulty formulating sentences.  No asymmetry noted.  MD in attendance.  Felt to be due to narcotics.  Fentanyl patch discontinued.  PT working with patient via Optometrist with success for increasing mobility and enabling him to get up in chair.  Pt requires chair mobility in order to receive HD in community setting.

## 2021-04-05 ENCOUNTER — Inpatient Hospital Stay (HOSPITAL_COMMUNITY): Payer: Medicare HMO

## 2021-04-05 LAB — RENAL FUNCTION PANEL
Albumin: 2.6 g/dL — ABNORMAL LOW (ref 3.5–5.0)
Anion gap: 8 (ref 5–15)
BUN: 23 mg/dL (ref 8–23)
CO2: 30 mmol/L (ref 22–32)
Calcium: 9.9 mg/dL (ref 8.9–10.3)
Chloride: 94 mmol/L — ABNORMAL LOW (ref 98–111)
Creatinine, Ser: 4.72 mg/dL — ABNORMAL HIGH (ref 0.61–1.24)
GFR, Estimated: 13 mL/min — ABNORMAL LOW (ref >60)
Glucose, Bld: 102 mg/dL — ABNORMAL HIGH (ref 70–99)
Phosphorus: 2.2 mg/dL — ABNORMAL LOW (ref 2.5–4.6)
Potassium: 4 mmol/L (ref 3.5–5.1)
Sodium: 132 mmol/L — ABNORMAL LOW (ref 135–145)

## 2021-04-05 MED ORDER — CYCLOBENZAPRINE HCL 10 MG PO TABS
5.0000 mg | ORAL_TABLET | Freq: Two times a day (BID) | ORAL | Status: DC
  Administered 2021-04-05 (×2): 5 mg via ORAL
  Filled 2021-04-05 (×2): qty 1

## 2021-04-05 MED ORDER — FENTANYL 25 MCG/HR TD PT72
1.0000 | MEDICATED_PATCH | TRANSDERMAL | Status: DC
  Administered 2021-04-05: 1 via TRANSDERMAL
  Filled 2021-04-05: qty 1

## 2021-04-05 MED ORDER — CEFAZOLIN SODIUM-DEXTROSE 2-4 GM/100ML-% IV SOLN
2.0000 g | INTRAVENOUS | Status: DC
  Administered 2021-04-06: 2 g via INTRAVENOUS
  Filled 2021-04-05: qty 100

## 2021-04-05 NOTE — Progress Notes (Signed)
[facility] Progress Note  Subjective: having more back pain today, and in the R leg. Walked a little yest but not as much as prior. Sat in chair for HD yesterday.   Vitals:   04/04/21 1414 04/04/21 1936 04/05/21 0409 04/05/21 0829  BP: (!) 150/70 (!) 144/66 132/82 (!) 145/61  Pulse: 66 67 65 64  Resp: '16 17 16 16  '$ Temp: 98.2 F (36.8 C) 99.1 F (37.3 C) 98.2 F (36.8 C) 98.5 F (36.9 C)  TempSrc: Oral Oral Oral Oral  SpO2: 96% 100% 100% 98%  Weight: 63 kg     Height:        Exam: General: NAD Heart: RRR Lungs: clear bilaterally Abdomen:Soft, non-tender, (+) bowel sounds Extremities:No edema BLLE Dialysis Access: L forearm AVF (+) Bruit/Thrill     OP HD: [facility] MWF  3h 20mn  180NRe 400/600  64kg   2/2 bath  P4  AVF  Hep 2200 -Mircera 150 mcg IV q 2 week (last dose 150 mcg IV 02/28/2021) -Venofer 100 mg IV X 10 doses (4/6 doses given. 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 IR did CT guided aspiration of 60 cc on 8/8, site too small for drain placement per IR.  Per ID, Continue cefazolin with HD, plan for 6 to 8 weeks course. Tentative end date is 04/30/21. H/o MSSA bacteremia - had previously been rx'd w/ 6 wks IV abx for bacteremia w/ pulm septic emboli and negative TEE. Had LUA AVF pain and erythema but imaging was neg for abscess and symptoms resolved.  Completed abx course on 02/22/21.  ESRD - Back on MWF schedule. HD Friday, shortened Rx d/t to staffing issues.  Dispostion - as an ESRD pt, he must be able to sit up in a recliner for dialysis to be considered a candidate for OP HD. Has did sit in the chair for HD yesterday 8/24 for the 1st time.  He has been walking in the halls some.  Hypertension/volume  - As noted above. BP well controlled. Continue midodrine 10 mg PO TIW pre HD. Stable vol, has prob lost weight , new  edw 62-63kg.    Anemia  - HGB 9.9.  Missed OP ESA 08/03. On aranesp 100 mcg q Monday here. Metabolic bone disease - Continue velphoro 500 mg 2 tabs PO TID AC. Continue VDRA. Labs at goal.  Nutrition - Renal carb mod diet, nepro, renal vits. DM-No meds on OP list. Last HA1c 6.9. per primary Intractable back pain: this is the main barrier to him sitting up/ rehabbing.   Disposition - SNF recommended.     Rob Jayne Peckenpaugh 04/05/2021, 11:04 AM   Recent Labs  Lab 04/01/21 0308 04/02/21 0414 04/02/21 0830 04/03/21 0138 04/04/21 0540 04/05/21 0241  K 4.5   < > 5.2*   < > 5.0 4.0  BUN 30*   < > 43*   < > 35* 23  CREATININE 4.53*   < > 6.60*   < > 6.37* 4.72*  CALCIUM 9.2   < > 9.8   < > 9.9 9.9  PHOS 2.7   < > 2.7   < > 2.4* 2.2*  HGB 9.1*  --  8.7*  --   --   --    < > = values in this interval not displayed.    Inpatient medications:  Chlorhexidine Gluconate Cloth  6 each Topical Q0600   cyclobenzaprine  5 mg Oral BID   darbepoetin (ARANESP) injection - DIALYSIS  100 mcg Intravenous Q Mon-HD   diclofenac  1 patch Transdermal BID   doxercalciferol  3 mcg Intravenous Q M,W,F-HD   feeding supplement (NEPRO CARB STEADY)  237 mL Oral BID BM   fentaNYL  1 patch Transdermal Q72H   gabapentin  100 mg Oral TID   heparin  5,000 Units Subcutaneous Q8H   lidocaine  1 patch Transdermal Q24H   midodrine  10 mg Oral Q M,W,F-HD   multivitamin  1 tablet Oral QHS   polyethylene glycol  17 g Oral BID   senna-docusate  2 tablet Oral BID   sodium chloride flush  3 mL Intravenous Q12H   sucroferric oxyhydroxide  1,000 mg Oral TID WC    [START ON 04/06/2021]  ceFAZolin (ANCEF) IV     acetaminophen **OR** acetaminophen, albuterol, HYDROcodone-acetaminophen, hydrOXYzine

## 2021-04-05 NOTE — Progress Notes (Signed)
Modified Barium Swallow Progress Note  Patient Details  Name: Ryan Wells MRN: [id number] Date of Birth: 09-03-1955  Today's Date: 04/05/2021  Modified Barium Swallow completed.  Full report located under Chart Review in the Imaging Section.  Brief recommendations include the following:  Clinical Impression  Pt demonstrates a moderate pharyngeal dysphagia. He is fully alert today and able to masticate well. However, there is moderate vallecular and pyriform residue, appearing to be secondary to both decreased epiglottic deflection and decreased UES opening due to presence of hardware. Otherwise peristaltic wave and hyolaryngeal mobility appear adequate. Pt has silent aspiration of thins during and after the swallow, particularly as they mix with solid residue. Pt has no sensation of aspiration, but can clear well with hard cued coughing. A chin tuck was ineffective. Recommend nectar thick liquids and dys 2 (finely chopped) solids. Pt to clear and cough frequently during swallowing. Hopeful for upgrade if pt can learn compensatory strategies and demonstrate slower careful rate of intake.   Swallow Evaluation Recommendations       SLP Diet Recommendations: Dysphagia 2 (Fine chop) solids;Nectar thick liquid   Liquid Administration via: Cup;Straw   Medication Administration: Whole meds with puree   Supervision: Patient able to self feed                    Lihanna Biever, Katherene Ponto 04/05/2021,2:54 PM

## 2021-04-05 NOTE — Progress Notes (Signed)
Pharmacy - > Cefazolin  Changed Cefazolin to be given on HD days -- 2 grams IV MWF Tentative end date 04/30/21  Thank you Anette Guarneri, PharmD

## 2021-04-05 NOTE — Progress Notes (Signed)
Physical Therapy Treatment Patient Details Name: Ryan Wells MRN: [id number] DOB: 02/06/1956 Today's Date: 04/05/2021    History of Present Illness Pt is 65 y.o. male presenting to ED on 8/3 with severe back/leg pain x2 weeks and diaphoresis.  Pt admitted with lumbar sacral osteomyelitis/discitis with septic arthritis L5-S1 and bil psoas abscess, R sided pleural effusion. . 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 At time of previous admission MRI (+) L5-S1 facet osteoarthritis, synovial cysts and periarticular soft tissue edema on the left. 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 Returned to ED 7/25 c/o radicular low back pain. 8/8 CT guided aspiration of left psoas abscess   PMHx significant for ESRD on HD, HTN, DMII, chornic anemia and MSSA.    PT Comments    Pt reports increased pain and states legs feel "heavy" today, but agreeable to attempt mobility. Pt requiring mod assist for bed mobility and transfer to standing, pt vocalizing in pain once standing and states he cannot progress to recliner at bedside or gait. PT encouraged LE exercises and progression OOB to recliner later today with RN assist if tolerated. PT to continue to follow.     Follow Up Recommendations  SNF     Equipment Recommendations  Rolling walker with 5" wheels    Recommendations for Other Services       Precautions / Restrictions Precautions Precautions: Fall Precaution Comments: Back pain - back precautions for comfort Required Braces or Orthoses: Spinal Brace (Has in room - for comfort (did not use today)) Spinal Brace: Applied in sitting position;Thoracolumbosacral orthotic (Brace in room - not donned per pt request)    Mobility  Bed Mobility Overal bed mobility: Needs Assistance Bed Mobility: Rolling;Sit to Sidelying;Sidelying to Sit Rolling: Mod assist Sidelying to sit: Mod assist     Sit to sidelying: Max  assist General bed mobility comments: mod-max assist for roll, trunk elevation/lowering, and LE management. Log roll technique utilized to limit back pain and provide spinal protection - pt very painful.    Transfers Overall transfer level: Needs assistance Equipment used: Rolling walker (2 wheeled) Transfers: Sit to/from Stand Sit to Stand: Mod assist;From elevated surface         General transfer comment: Mod assist for power up, rise with truncal support, and steadying upon standing. Pt screaming in pain once standing, terminating further stand attempts.  Ambulation/Gait             General Gait Details: nt   Marine scientist Rankin (Stroke Patients Only)       Balance Overall balance assessment: Needs assistance Sitting-balance support: Bilateral upper extremity supported;Feet supported Sitting balance-Leahy Scale: Fair   Postural control: Right lateral lean Standing balance support: Bilateral upper extremity supported Standing balance-Leahy Scale: Poor Standing balance comment: Reliant on external support                            Cognition Arousal/Alertness: Awake/alert Behavior During Therapy: Flat affect Overall Cognitive Status: No family/caregiver present to determine baseline cognitive functioning Area of Impairment: Following commands                       Following Commands: Follows one step commands with increased time              Exercises General  Exercises - Lower Extremity Heel Slides: Both;10 reps;Supine;AAROM    General Comments        Pertinent Vitals/Pain Pain Assessment: Faces Faces Pain Scale: Hurts whole lot Pain Location: back, legs Pain Descriptors / Indicators: Discomfort;Grimacing;Moaning Pain Intervention(s): Limited activity within patient's tolerance;Monitored during session;Repositioned    Home Living                      Prior Function             PT Goals (current goals can now be found in the care plan section) Acute Rehab PT Goals Patient Stated Goal: Get back to bed Time For Goal Achievement: 04/16/21 Potential to Achieve Goals: Fair Progress towards PT goals: Not progressing toward goals - comment (limited by severe pain (had pain meds 2.5 hours prior to session))    Frequency    Min 3X/week      PT Plan Current plan remains appropriate    Co-evaluation              AM-PAC PT "6 Clicks" Mobility   Outcome Measure  Help needed turning from your back to your side while in a flat bed without using bedrails?: A Little Help needed moving from lying on your back to sitting on the side of a flat bed without using bedrails?: A Lot Help needed moving to and from a bed to a chair (including a wheelchair)?: A Lot Help needed standing up from a chair using your arms (e.g., wheelchair or bedside chair)?: A Lot Help needed to walk in hospital room?: Total Help needed climbing 3-5 steps with a railing? : Total 6 Click Score: 11    End of Session Equipment Utilized During Treatment: Gait belt Activity Tolerance: Patient limited by pain Patient left: with call bell/phone within reach;in bed;with bed alarm set (pt declines transfer OOB to recliner) Nurse Communication: Mobility status PT Visit Diagnosis: Difficulty in walking, not elsewhere classified (R26.2);Pain Pain - Right/Left:  (central back; bil LEs) Pain - part of body:  (back)     Time: [id number] PT Time Calculation (min) (ACUTE ONLY): 21 min  Charges:  $Therapeutic Activity: 8-22 mins                    Stacie Glaze, PT DPT Acute Rehabilitation Services Pager [phone]  Office [phone]    Roxine Caddy E Ruffin Pyo 04/05/2021, 2:11 PM

## 2021-04-05 NOTE — Progress Notes (Signed)
PROGRESS NOTE    Ryan Wells  [id number] DOB: Jul 25, 1956 DOA: 03/14/2021 PCP: Pcp, No    Brief Narrative:   Ryan Wells is a 65 y.o. male with medical history of end-stage renal disease on hemodialysis, essential hypertension, diabetes mellitus type 2, anemia of chronic kidney disease,  presented to the hospital with back and leg pain worsening for 2 weeks.  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  He had completed 6 weeks of IV antibiotics with dialysis per ID notes from 8/2.  Patient continued to have worsening symptoms so he came back to the ED.  On this admission, in the emergency department patient was seen to be afebrile with stable vital signs.  Laboratory data showed leukocytosis.  Chest x-ray significant for cardiomegaly with vascular congestion, focal area with pulmonary nodularity of the right mid lung correlates with cavitating lesion and a small right-sided pleural effusion.  Patient was then admitted to the hospital for further evaluation and treatment.  During hospitalization, patient persisted to have lower back pain.  He has been  adjusted on  pain medication regimen with variable control of his pain. Currently, awaiting for skilled nursing facility/CIR placement but being limited by ability to tolerate the hemodialysis chair.   Principal Problem:   Lumbar back pain Active Problems:   Subclinical hypothyroidism   Anemia due to chronic kidney disease   Diabetes mellitus type II, non insulin dependent (HCC)   ESRD on hemodialysis (HCC)   Pleural effusion   Elevated troponin    Leg pain   Leukocytosis   History of bacteremia  Lumbar/sacral osteomyelitis/discitis, L5-S1, Septic arthritis L5-S1, and on admission, left psoas abscess s/p IR aspiration Patient presented with worsening lumbar back pain with radiation of pain.  History of MSSA bacteremia on 03/13/2021.  Currently afebrile with no leukocytosis. MRI of the lumbosacral spine on 03/16/21 was repeated which was notable for L5-S1 discitis with osteomyelitis with left psoas abscess.  IR drained 60 mL of purulent fluid from the left psoas region.  Neurosurgery and infectious disease followed the patient during hospitalization.  Repeat blood cultures have been negative patient is afebrile without leukocytosis.  Fluid culture from aspiration showed Staph aureus sensitive to all antibiotics.   Plan to continue cefazolin with hemodialysis for 6 to 8 weeks.  Tentative end date of antibiotics 04/30/2021 as per infectious disease.  Intractable back pain.   Patient remained pretty debilitated with severe back pain and spasm and unable to sit up for dialysis. 8/22, fentanyl patch increased to 50 mcg/h, Flexeril 5 mg 3 times daily and as needed Norco. 8/23, patient had excellent response and he walked in the hallway but became more sedated. 8/24, denies any pain.  He was more sedated.  We discontinued his fentanyl patch and change Flexeril to as needed.  8/25, patient is more alert and with severe pain now.  Resume fentanyl 25 mcg/h patch, Flexeril 5 mg twice a day, local lidocaine and gluconate patch, Norco 10 mg every 4 hours as needed.  ESRD on HD with hyperkalemia.:  Continue hemodialysis as per nephrology.  Now on a schedule Monday Wednesday Friday.  Diabetes mellitus type 2:  Latest  hemoglobin A1c of 5.1 on 01/16/2021.  Diet controlled.   History of MSSA bacteremia:  Has previously completed 6 weeks of antibiotic.   On IV cefazolin at this time.  Plan to continue IV antibiotic for 6 to 8 weeks as per ID recommendation.  End  date 9/19.   Anemia chronic kidney disease:  Baseline hemoglobin of around 8.0.  Hemoglobin is stable.  Subclinical hypothyroidism:  T4 normal.  TSH elevated.  Follow-up in 4 to 6 weeks  Weakness/deconditioning/gait disturbance: Physical therapy on board and recommending nursing facility placement.    DVT prophylaxis: heparin injection 5,000 Units Start: 03/15/21 1400   CODE STATUS-full code  Family Communication:   Patient's son Mr. Irving on the phone 8/24.  Disposition Plan:   Level of care: Med-Surg  Status is: Inpatient  Remains inpatient appropriate because: Unsafe disposition plan.  Dispo: The patient is from: Home              Anticipated d/c is to: SNF when bed available.              Patient currently is medically stable to d/c.   Difficult to place patient No  Consultants:  Neurosurgery Nephrology Infectious disease Interventional radiology  Procedures:  IR guided aspiration of the left psoas abscess on 03/19/2021 with removal of 60 mL purulent fluid. Hemodialysis.  Antimicrobials:  Ceftriaxone 8/4 - 8/6 Cefazolin 8/6>>  Subjective: Patient seen and examined.  Translator at the bedside.  More awake today.  Pain 9 out of 10 even on coughing.  He tells me that he would rather be sleepy than hurting so much.   Objective: Vitals:   04/04/21 1414 04/04/21 1936 04/05/21 0409 04/05/21 0829  BP: (!) 150/70 (!) 144/66 132/82 (!) 145/61  Pulse: 66 67 65 64  Resp: '16 17 16 16  '$ Temp: 98.2 F (36.8 C) 99.1 F (37.3 C) 98.2 F (36.8 C) 98.5 F (36.9 C)  TempSrc: Oral Oral Oral Oral  SpO2: 96% 100% 100% 98%  Weight: 63 kg     Height:        Intake/Output Summary (Last 24 hours) at 04/05/2021 1013 Last data filed at 04/04/2021 1414 Gross per 24 hour  Intake --  Output 2000 ml  Net -2000 ml     Filed Weights   04/02/21 0815 04/04/21 1100 04/04/21 1414  Weight: 65 kg 65 kg 63 kg   Physical examination:  General: Debilitated and sick looking  gentleman not in any distress at rest. Alert and awake.  Anxious with pain on mobility.  Cardiovascular: S1-S2 normal.  Regular. Respiratory: Bilateral clear.  No added sounds. Gastrointestinal: Soft.  Nontender.  Bowel sounds present.   Ext: No cyanosis or edema.  Left upper extremity AV fistula present.    Data Reviewed: I have personally reviewed the following labs and imaging studies   CBC: Recent Labs  Lab 03/30/21 0214 04/01/21 0308 04/02/21 0830  WBC 9.0 6.1 6.9  HGB 9.0* 9.1* 8.7*  HCT 29.8* 31.3* 29.4*  MCV 99.3 101.3* 101.4*  PLT 361 382 [id number]   Basic Metabolic Panel: Recent Labs  Lab 04/02/21 0414 04/02/21 0830 04/03/21 0138 04/04/21 0540 04/05/21 0241  NA 132* 134* 135 131* 132*  K 4.9 5.2* 4.6 5.0 4.0  CL 94* 96* 94* 90* 94*  CO2 30 29 33* 33* 30  GLUCOSE 132* 111* 106* 112* 102*  BUN 41* 43* 22 35* 23  CREATININE 6.28* 6.60* 4.28* 6.37* 4.72*  CALCIUM 9.7 9.8 9.2 9.9 9.9  PHOS 2.7 2.7 2.2* 2.4* 2.2*   GFR: Estimated Creatinine Clearance: 11.9 mL/min (A) (by C-G formula based on SCr of 4.72 mg/dL (H)). Liver Function Tests: Recent Labs  Lab 04/02/21 0414 04/02/21 0830 04/03/21 0138 04/04/21 0540 04/05/21 0241  ALBUMIN 2.5* 2.5* 2.7* 2.6* 2.6*   No results for input(s): LIPASE, AMYLASE in the last 168 hours. No results for input(s): AMMONIA in the last 168 hours. Coagulation Profile: No results for input(s): INR, PROTIME in the last 168 hours.  Cardiac Enzymes: No results for input(s): CKTOTAL, CKMB, CKMBINDEX, TROPONINI in the last 168 hours. BNP (last 3 results) No results for input(s): PROBNP in the last 8760 hours. HbA1C: No results for input(s): HGBA1C in the last 72 hours. CBG: Recent Labs  Lab 04/04/21 0936  GLUCAP 116*   Lipid Profile: No results for input(s): CHOL, HDL, LDLCALC, TRIG, CHOLHDL, LDLDIRECT in the last 72 hours. Thyroid Function Tests: No results for input(s): TSH, T4TOTAL, FREET4, T3FREE, THYROIDAB in the last  72 hours.  Anemia Panel: No results for input(s): VITAMINB12, FOLATE, FERRITIN, TIBC, IRON, RETICCTPCT in the last 72 hours. Sepsis Labs: No results for input(s): PROCALCITON, LATICACIDVEN in the last 168 hours.  No results found for this or any previous visit (from the past 240 hour(s)).     Radiology Studies: No results found.    Scheduled Meds:  Chlorhexidine Gluconate Cloth  6 each Topical Q0600   cyclobenzaprine  5 mg Oral BID   darbepoetin (ARANESP) injection - DIALYSIS  100 mcg Intravenous Q Mon-HD   diclofenac  1 patch Transdermal BID   doxercalciferol  3 mcg Intravenous Q M,W,F-HD   feeding supplement (NEPRO CARB STEADY)  237 mL Oral BID BM   fentaNYL  1 patch Transdermal Q72H   gabapentin  100 mg Oral TID   heparin  5,000 Units Subcutaneous Q8H   lidocaine  1 patch Transdermal Q24H   midodrine  10 mg Oral Q M,W,F-HD   multivitamin  1 tablet Oral QHS   polyethylene glycol  17 g Oral BID   senna-docusate  2 tablet Oral BID   sodium chloride flush  3 mL Intravenous Q12H   sucroferric oxyhydroxide  1,000 mg Oral TID WC   Continuous Infusions:  [START ON 04/06/2021]  ceFAZolin (ANCEF) IV       LOS: 21 days   Total time spent: 25 minutes  Barb Merino, MD [facility] 04/05/2021, 10:13 AM

## 2021-04-05 NOTE — Progress Notes (Signed)
Patient off floor for MBS.

## 2021-04-06 LAB — RENAL FUNCTION PANEL
Albumin: 2.5 g/dL — ABNORMAL LOW (ref 3.5–5.0)
Anion gap: 14 (ref 5–15)
BUN: 39 mg/dL — ABNORMAL HIGH (ref 8–23)
CO2: 28 mmol/L (ref 22–32)
Calcium: 10.4 mg/dL — ABNORMAL HIGH (ref 8.9–10.3)
Chloride: 90 mmol/L — ABNORMAL LOW (ref 98–111)
Creatinine, Ser: 6.56 mg/dL — ABNORMAL HIGH (ref 0.61–1.24)
GFR, Estimated: 9 mL/min — ABNORMAL LOW (ref >60)
Glucose, Bld: 116 mg/dL — ABNORMAL HIGH (ref 70–99)
Phosphorus: 3.1 mg/dL (ref 2.5–4.6)
Potassium: 4.6 mmol/L (ref 3.5–5.1)
Sodium: 132 mmol/L — ABNORMAL LOW (ref 135–145)

## 2021-04-06 LAB — SARS CORONAVIRUS 2 (TAT 6-24 HRS): SARS Coronavirus 2: NEGATIVE

## 2021-04-06 MED ORDER — CYCLOBENZAPRINE HCL 10 MG PO TABS
5.0000 mg | ORAL_TABLET | Freq: Three times a day (TID) | ORAL | Status: DC
  Administered 2021-04-06 – 2021-04-07 (×6): 5 mg via ORAL
  Filled 2021-04-06 (×6): qty 1

## 2021-04-06 NOTE — Progress Notes (Signed)
Hemostasis achieved, gauze dressing applied

## 2021-04-06 NOTE — TOC Progression Note (Signed)
Transition of Care [facility]) - Progression Note    Patient Details  Name: Ryan Wells MRN: [id number] Date of Birth: 05-01-1956  Transition of [facility]) CM/SW Contact  Joanne Chars, LCSW Phone Number: 04/06/2021, 10:26 AM  Clinical Narrative:   CSW confirmed with Tressa Busman at [facility] that pt can be admitted tomorrow, Saturday.  Tressa Busman confirms.  Will need new covid.  Josem Kaufmann is in place.      Expected Discharge Plan: IP Rehab Facility Barriers to Discharge: Other (must enter comment), Continued Medical Work up (ability to sit for HD)  Expected Discharge Plan and Services Expected Discharge Plan: [place]     Post Acute Care Choice: IP Rehab Living arrangements for the past 2 months: Single Family Home                                       Social Determinants of Health (SDOH) Interventions    Readmission Risk Interventions No flowsheet data found.

## 2021-04-06 NOTE — Progress Notes (Signed)
[facility] Progress Note  Subjective: stable, seen in room  Vitals:   04/05/21 0829 04/05/21 1528 04/05/21 2102 04/06/21 0500  BP: (!) 145/61 (!) 127/51 (!) 126/58 (!) 124/57  Pulse: 64 61 65 (!) 59  Resp: '16 16 16 18  '$ Temp: 98.5 F (36.9 C) 98.3 F (36.8 C) 98.6 F (37 C) 98.1 F (36.7 C)  TempSrc: Oral Oral Oral Oral  SpO2: 98% 100% 100% 94%  Weight:      Height:        Exam: General: NAD Heart: RRR Lungs: clear bilaterally Abdomen:Soft, non-tender, (+) bowel sounds Extremities:No edema BLLE Dialysis Access: L forearm AVF (+) Bruit/Thrill     [facility] HD: [facility]    3h 68mn  64kg  2/2 bath P5  AVF Hep 2200 -Mircera 150 mcg IV q 2 week (last dose 150 mcg IV 02/28/2021) -Venofer 100 mg IV X 10 doses (4/6 doses given. 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 IR did CT guided aspiration of 60 cc on 8/8, site too small for drain placement per IR.  Per ID, Continue cefazolin with HD, plan for 6 to 8 weeks course. Tentative end date is 04/30/21. Recent MSSA bacteremia - had previously been rx'd w/ 6 wks IV abx for bacteremia w/ pulm septic emboli and negative TEE. Had LUA AVF pain and erythema but imaging was neg for abscess and symptoms resolved.  Completed abx course on 02/22/21.  ESRD - Back on MWF schedule. HD Friday, shortened Rx d/t to staffing issues.  Dispostion - has been able to sit in the chair for HD here.  Hypertension/volume  - As noted above. BP well controlled. Continue midodrine 10 mg PO TIW pre HD. Stable vol, has prob lost weight , new edw 62-63kg.    Anemia  - HGB 9.9.  Missed OP ESA 08/03. On aranesp 100 mcg q Monday here. Metabolic bone disease - Continue velphoro 500 mg 2 tabs PO TID AC. Continue VDRA. Labs at goal.  Nutrition - Renal carb mod diet, nepro, renal vits. DM-No meds on OP list. Last HA1c 6.9. per  primary Disposition - SNF placement, poss dc tomorrow     Rob Skyelynn Rambeau 04/06/2021, 12:17 PM   Recent Labs  Lab 04/01/21 0308 04/02/21 0414 04/02/21 0830 04/03/21 0138 04/05/21 0241 04/06/21 0348  K 4.5   < > 5.2*   < > 4.0 4.6  BUN 30*   < > 43*   < > 23 39*  CREATININE 4.53*   < > 6.60*   < > 4.72* 6.56*  CALCIUM 9.2   < > 9.8   < > 9.9 10.4*  PHOS 2.7   < > 2.7   < > 2.2* 3.1  HGB 9.1*  --  8.7*  --   --   --    < > = values in this interval not displayed.    Inpatient medications:  Chlorhexidine Gluconate Cloth  6 each Topical Q0600   cyclobenzaprine  5 mg Oral TID   darbepoetin (ARANESP) injection - DIALYSIS  100 mcg Intravenous Q Mon-HD   diclofenac  1 patch Transdermal BID   doxercalciferol  3 mcg Intravenous Q M,W,F-HD   feeding supplement (NEPRO CARB STEADY)  237 mL Oral BID BM   fentaNYL  1 patch Transdermal Q72H   gabapentin  100 mg Oral TID  heparin  5,000 Units Subcutaneous Q8H   lidocaine  1 patch Transdermal Q24H   midodrine  10 mg Oral Q M,W,F-HD   multivitamin  1 tablet Oral QHS   polyethylene glycol  17 g Oral BID   senna-docusate  2 tablet Oral BID   sodium chloride flush  3 mL Intravenous Q12H   sucroferric oxyhydroxide  1,000 mg Oral TID WC     ceFAZolin (ANCEF) IV     acetaminophen **OR** acetaminophen, albuterol, HYDROcodone-acetaminophen, hydrOXYzine

## 2021-04-06 NOTE — Progress Notes (Signed)
Occupational Therapy Treatment Patient Details Name: Ryan Wells MRN: [id number] DOB: Dec 28, 1955 Today's Date: 04/06/2021    History of present illness 65 y.o. male presenting to ED on 8/3 with severe back/leg pain x2 weeks and diaphoresis.  Pt admitted with lumbar sacral osteomyelitis/discitis with septic arthritis L5-S1 and bil psoas abscess, R sided pleural effusion. . 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 At time of previous admission MRI (+) L5-S1 facet osteoarthritis, synovial cysts and periarticular soft tissue edema on the left. 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 Returned to ED 7/25 c/o radicular low back pain. 8/8 CT guided aspiration of left psoas abscess   PMHx significant for ESRD on HD, HTN, DMII, chornic anemia and MSSA.   OT comments  Pt continues to present with poor balance, activity tolerance, and strength impacting his functional performance. Pt reporting need to use toilet. Pt performing stand pivot to/from [facility] with Mod A due to posterior lean in standing. Unsuccessful BM. In-person spanish interpreter present. Continue to recommend dc to SNF and will continue to follow acutely as admitted.    Follow Up Recommendations  Supervision/Assistance - 24 hour;SNF    Equipment Recommendations  Other (comment)    Recommendations for Other Services Rehab consult    Precautions / Restrictions Precautions Precautions: Fall Precaution Comments: Back pain - back precautions for comfort Required Braces or Orthoses: Spinal Brace Spinal Brace: Thoracolumbosacral orthotic;Applied in sitting position       Mobility Bed Mobility Overal bed mobility: Needs Assistance Bed Mobility: Rolling;Sidelying to Sit;Sit to Sidelying Rolling: Min assist Sidelying to sit: Mod assist     Sit to sidelying: Mod assist General bed mobility comments: Mod A for elevating trunk into upright posture. Mod A to  elevate BLEs    Transfers Overall transfer level: Needs assistance Equipment used: Rolling walker (2 wheeled) Transfers: Sit to/from [facility] Sit to Stand: Min assist Stand pivot transfers: Mod assist       General transfer comment: Min A to initiate power up and then Mod A for maintaining balance during pivot. Significant posterior lean in standing. Cues for weight shift forward    Balance Overall balance assessment: Needs assistance Sitting-balance support: No upper extremity supported;Feet supported Sitting balance-Leahy Scale: Fair     Standing balance support: Bilateral upper extremity supported;During functional activity Standing balance-Leahy Scale: Poor                             ADL either performed or assessed with clinical judgement   ADL Overall ADL's : Needs assistance/impaired                         Toilet Transfer: Moderate assistance;BSC;Stand-pivot Toilet Transfer Details (indicate cue type and reason): Pt with poor balance and strength. Requiring Mod A for pivot to [facility]. Posterior lean throughout.         Functional mobility during ADLs: Moderate assistance;Rolling walker;Minimal assistance General ADL Comments: Pt presenting poor balance (significant posterior lean), safety, and strength     Vision       Perception     Praxis      Cognition Arousal/Alertness: Awake/alert Behavior During Therapy: Flat affect Overall Cognitive Status: No family/caregiver present to determine baseline cognitive functioning Area of Impairment: Following commands;Problem solving;Awareness  Following Commands: Follows one step commands with increased time   Awareness: Intellectual Problem Solving: Slow processing;Requires verbal cues;Difficulty sequencing General Comments: Pt continues to present with decreased awareness and safety. Poor problem solving and requiring cues        Exercises      Shoulder Instructions       General Comments Spanish in person interpreter present throughout    Pertinent Vitals/ Pain       Pain Assessment: 0-10 Pain Score: 7  Pain Location: back, legs Pain Descriptors / Indicators: Discomfort;Grimacing;Moaning Pain Intervention(s): Monitored during session;Limited activity within patient's tolerance;Repositioned  Home Living                                          Prior Functioning/Environment              Frequency  Min 2X/week        Progress Toward Goals  OT Goals(current goals can now be found in the care plan section)  Progress towards OT goals: Not progressing toward goals - comment  Acute Rehab OT Goals Patient Stated Goal: Get back to bed OT Goal Formulation: With patient Time For Goal Achievement: 04/17/21 Potential to Achieve Goals: Fair ADL Goals Pt Will Perform Upper Body Dressing: with supervision;sitting Pt Will Perform Lower Body Dressing: with supervision;sit to/from stand;with adaptive equipment Pt Will Transfer to Toilet: with supervision;bedside commode Pt Will Perform Toileting - Clothing Manipulation and hygiene: with supervision;sit to/from stand  Plan Discharge plan needs to be updated    Co-evaluation                 AM-PAC OT "6 Clicks" Daily Activity     Outcome Measure   Help from another person eating meals?: None Help from another person taking care of personal grooming?: A Little Help from another person toileting, which includes using toliet, bedpan, or urinal?: A Lot Help from another person bathing (including washing, rinsing, drying)?: A Lot Help from another person to put on and taking off regular upper body clothing?: A Little Help from another person to put on and taking off regular lower body clothing?: A Lot 6 Click Score: 16    End of Session Equipment Utilized During Treatment: Back brace;Rolling walker  OT Visit Diagnosis: Unsteadiness on feet  (R26.81);Repeated falls (R29.6);Muscle weakness (generalized) (M62.81);Pain Pain - Right/Left: Left Pain - part of body: Leg   Activity Tolerance Patient limited by fatigue   Patient Left in bed;with call bell/phone within reach;with bed alarm set   Nurse Communication Mobility status        Time: [id number] OT Time Calculation (min): 29 min  Charges: OT General Charges $OT Visit: 1 Visit OT Treatments $Self Care/Home Management : 23-37 mins  [place], OTR/L Acute Rehab Pager: [phone] Office: [place] 04/06/2021, 1:03 PM

## 2021-04-06 NOTE — Progress Notes (Signed)
PROGRESS NOTE    Ryan Wells  [id number] DOB: Nov 16, 1955 DOA: 03/14/2021 PCP: Pcp, No    Brief Narrative:   Ryan Wells is a 65 y.o. male with medical history of end-stage renal disease on hemodialysis, essential hypertension, diabetes mellitus type 2, anemia of chronic kidney disease,  presented to the hospital with back and leg pain worsening for 2 weeks.  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  He had completed 6 weeks of IV antibiotics with dialysis per ID notes from 8/2.  Patient continued to have worsening symptoms so he came back to the ED.  On this admission, in the emergency department patient was seen to be afebrile with stable vital signs.  Laboratory data showed leukocytosis.  Chest x-ray significant for cardiomegaly with vascular congestion, focal area with pulmonary nodularity of the right mid lung correlates with cavitating lesion and a small right-sided pleural effusion.  Patient was then admitted to the hospital for further evaluation and treatment.  During hospitalization, patient persisted to have lower back pain.  He has been  adjusted on  pain medication regimen with variable control of his pain. Currently, awaiting for skilled nursing facility/CIR placement but being limited by ability to tolerate the hemodialysis chair.   Principal Problem:   Lumbar back pain Active Problems:   Subclinical hypothyroidism   Anemia due to chronic kidney disease   Diabetes mellitus type II, non insulin dependent (HCC)   ESRD on hemodialysis (HCC)   Pleural effusion   Elevated troponin    Leg pain   Leukocytosis   History of bacteremia  Lumbar/sacral osteomyelitis/discitis, L5-S1, Septic arthritis L5-S1, and on admission, left psoas abscess s/p IR aspiration Patient presented with worsening lumbar back pain with radiation of pain.  History of MSSA bacteremia on 03/13/2021.  Currently afebrile with no leukocytosis. MRI of the lumbosacral spine on 03/16/21 was repeated which was notable for L5-S1 discitis with osteomyelitis with left psoas abscess.  IR drained 60 mL of purulent fluid from the left psoas region.  Neurosurgery and infectious disease followed the patient during hospitalization.  Repeat blood cultures have been negative patient is afebrile without leukocytosis.  Fluid culture from aspiration showed Staph aureus sensitive to all antibiotics.   Plan to continue cefazolin with hemodialysis for 6 to 8 weeks.  Tentative end date of antibiotics 04/30/2021 as per infectious disease.  Intractable back pain.   Patient remained pretty debilitated with severe back pain and spasm and unable to sit up for dialysis. 8/22, fentanyl patch increased to 50 mcg/h, Flexeril 5 mg 3 times daily and as needed Norco. 8/23, patient had excellent response and he walked in the hallway but became more sedated. 8/24, denies any pain.  He was more sedated.  We discontinued his fentanyl patch and change Flexeril to as needed.  8/25, patient is more alert and with severe pain so pain medications resumed. Resume fentanyl 25 mcg/h patch, Flexeril 5 mg twice a day, local lidocaine and gluconate patch, Norco 10 mg every 4 hours as needed. 8/26, increase Flexeril 5 mg 3 times a day scheduled.  This was very good to control his pain.  ESRD on HD with hyperkalemia.:  Continue hemodialysis as per nephrology.  Now on a schedule Monday Wednesday Friday.  For dialysis today.  Diabetes mellitus type 2:  Latest hemoglobin A1c of 5.1 on 01/16/2021.  Diet controlled.   History of MSSA bacteremia:  Has previously  completed 6 weeks of antibiotic.   On IV cefazolin at this time.  Plan to continue IV antibiotic for 6 to 8 weeks as per ID recommendation.  End date 9/19.   Anemia chronic kidney disease:  Baseline hemoglobin of around 8.0.  Hemoglobin is stable.  Subclinical hypothyroidism:  T4 normal.  TSH elevated.  Follow-up in 4 to 6 weeks  Weakness/deconditioning/gait disturbance: Physical therapy on board and recommending nursing facility placement.    DVT prophylaxis: heparin injection 5,000 Units Start: 03/15/21 1400   CODE STATUS-full code  Family Communication:   None.  Disposition Plan:   Level of care: Med-Surg  Status is: Inpatient  Remains inpatient appropriate because: Unsafe disposition plan.  Dispo: The patient is from: Home              Anticipated d/c is to: SNF when bed available.              Patient currently is medically stable to d/c.   Difficult to place patient No  Consultants:  Neurosurgery Nephrology Infectious disease Interventional radiology  Procedures:  IR guided aspiration of the left psoas abscess on 03/19/2021 with removal of 60 mL purulent fluid. Hemodialysis.  Antimicrobials:  Ceftriaxone 8/4 - 8/6 Cefazolin 8/6>>  Subjective: Patient seen and examined.  Still having significant pain on mobility.  Some improvement since starting new medications yesterday, however not much improvement.  Able to sit up in the chair for dialysis.   Objective: Vitals:   04/05/21 0829 04/05/21 1528 04/05/21 2102 04/06/21 0500  BP: (!) 145/61 (!) 127/51 (!) 126/58 (!) 124/57  Pulse: 64 61 65 (!) 59  Resp: '16 16 16 18  '$ Temp: 98.5 F (36.9 C) 98.3 F (36.8 C) 98.6 F (37 C) 98.1 F (36.7 C)  TempSrc: Oral Oral Oral Oral  SpO2: 98% 100% 100% 94%  Weight:      Height:        Intake/Output Summary (Last 24 hours) at 04/06/2021 1115 Last data filed at 04/05/2021 1442 Gross per 24 hour  Intake 20 ml  Output --  Net 20 ml     Filed Weights   04/02/21  0815 04/04/21 1100 04/04/21 1414  Weight: 65 kg 65 kg 63 kg   Physical examination:  General: Debilitated and sick looking gentleman not in any distress at rest. Alert and awake.  Very anxious on mobility. Cardiovascular: S1-S2 normal.  Regular. Respiratory: Bilateral clear.  No added sounds. Gastrointestinal: Soft.  Nontender.  Bowel sounds present.   Ext: No cyanosis or edema.  Left upper extremity AV fistula present.    Data Reviewed: I have personally reviewed the following labs and imaging studies   CBC: Recent Labs  Lab 04/01/21 0308 04/02/21 0830  WBC 6.1 6.9  HGB 9.1* 8.7*  HCT 31.3* 29.4*  MCV 101.3* 101.4*  PLT 382 [id number]   Basic Metabolic Panel: Recent Labs  Lab 04/02/21 0830 04/03/21 0138 04/04/21 0540 04/05/21 0241 04/06/21 0348  NA 134* 135 131* 132* 132*  K 5.2* 4.6 5.0 4.0 4.6  CL 96* 94* 90* 94* 90*  CO2 29 33* 33* 30 28  GLUCOSE 111* 106* 112* 102* 116*  BUN 43* 22 35* 23 39*  CREATININE 6.60* 4.28* 6.37* 4.72* 6.56*  CALCIUM 9.8 9.2 9.9 9.9 10.4*  PHOS 2.7 2.2* 2.4* 2.2* 3.1   GFR: Estimated Creatinine Clearance: 8.6 mL/min (A) (by C-G formula based on SCr of 6.56 mg/dL (H)). Liver Function Tests: Recent Labs  Lab 04/02/21 0830 04/03/21 0138 04/04/21 0540 04/05/21 0241 04/06/21 0348  ALBUMIN 2.5* 2.7* 2.6* 2.6* 2.5*   No results for input(s): LIPASE, AMYLASE in the last 168 hours. No results for input(s): AMMONIA in the last 168 hours. Coagulation Profile: No results for input(s): INR, PROTIME in the last 168 hours.  Cardiac Enzymes: No results for input(s): CKTOTAL, CKMB, CKMBINDEX, TROPONINI in the last 168 hours. BNP (last 3 results) No results for input(s): PROBNP in the last 8760 hours. HbA1C: No results for input(s): HGBA1C in the last 72 hours. CBG: Recent Labs  Lab 04/04/21 0936  GLUCAP 116*   Lipid Profile: No results for input(s): CHOL, HDL, LDLCALC, TRIG, CHOLHDL, LDLDIRECT in the last 72 hours. Thyroid Function  Tests: No results for input(s): TSH, T4TOTAL, FREET4, T3FREE, THYROIDAB in the last 72 hours.  Anemia Panel: No results for input(s): VITAMINB12, FOLATE, FERRITIN, TIBC, IRON, RETICCTPCT in the last 72 hours. Sepsis Labs: No results for input(s): PROCALCITON, LATICACIDVEN in the last 168 hours.  No results found for this or any previous visit (from the past 240 hour(s)).     Radiology Studies: No results found.    Scheduled Meds:  Chlorhexidine Gluconate Cloth  6 each Topical Q0600   cyclobenzaprine  5 mg Oral TID   darbepoetin (ARANESP) injection - DIALYSIS  100 mcg Intravenous Q Mon-HD   diclofenac  1 patch Transdermal BID   doxercalciferol  3 mcg Intravenous Q M,W,F-HD   feeding supplement (NEPRO CARB STEADY)  237 mL Oral BID BM   fentaNYL  1 patch Transdermal Q72H   gabapentin  100 mg Oral TID   heparin  5,000 Units Subcutaneous Q8H   lidocaine  1 patch Transdermal Q24H   midodrine  10 mg Oral Q M,W,F-HD   multivitamin  1 tablet Oral QHS   polyethylene glycol  17 g Oral BID   senna-docusate  2 tablet Oral BID   sodium chloride flush  3 mL Intravenous Q12H   sucroferric oxyhydroxide  1,000 mg Oral TID WC   Continuous Infusions:   ceFAZolin (ANCEF) IV       LOS: 22 days   Total time spent: 25 minutes  Barb Merino, MD [facility] 04/06/2021, 11:15 AM

## 2021-04-07 DIAGNOSIS — R5381 Other malaise: Secondary | ICD-10-CM | POA: Diagnosis not present

## 2021-04-07 DIAGNOSIS — D638 Anemia in other chronic diseases classified elsewhere: Secondary | ICD-10-CM | POA: Diagnosis not present

## 2021-04-07 DIAGNOSIS — I714 Abdominal aortic aneurysm, without rupture: Secondary | ICD-10-CM | POA: Diagnosis not present

## 2021-04-07 DIAGNOSIS — M0088 Arthritis due to other bacteria, vertebrae: Secondary | ICD-10-CM | POA: Diagnosis not present

## 2021-04-07 DIAGNOSIS — N2581 Secondary hyperparathyroidism of renal origin: Secondary | ICD-10-CM | POA: Diagnosis not present

## 2021-04-07 DIAGNOSIS — Z8679 Personal history of other diseases of the circulatory system: Secondary | ICD-10-CM | POA: Diagnosis not present

## 2021-04-07 DIAGNOSIS — N250 Renal osteodystrophy: Secondary | ICD-10-CM | POA: Diagnosis not present

## 2021-04-07 DIAGNOSIS — E1122 Type 2 diabetes mellitus with diabetic chronic kidney disease: Secondary | ICD-10-CM | POA: Diagnosis not present

## 2021-04-07 DIAGNOSIS — M549 Dorsalgia, unspecified: Secondary | ICD-10-CM | POA: Diagnosis not present

## 2021-04-07 DIAGNOSIS — E1129 Type 2 diabetes mellitus with other diabetic kidney complication: Secondary | ICD-10-CM | POA: Diagnosis not present

## 2021-04-07 DIAGNOSIS — J810 Acute pulmonary edema: Secondary | ICD-10-CM | POA: Diagnosis not present

## 2021-04-07 DIAGNOSIS — E119 Type 2 diabetes mellitus without complications: Secondary | ICD-10-CM | POA: Diagnosis not present

## 2021-04-07 DIAGNOSIS — M4627 Osteomyelitis of vertebra, lumbosacral region: Secondary | ICD-10-CM | POA: Diagnosis not present

## 2021-04-07 DIAGNOSIS — M4646 Discitis, unspecified, lumbar region: Secondary | ICD-10-CM | POA: Diagnosis not present

## 2021-04-07 DIAGNOSIS — I150 Renovascular hypertension: Secondary | ICD-10-CM | POA: Diagnosis not present

## 2021-04-07 DIAGNOSIS — R404 Transient alteration of awareness: Secondary | ICD-10-CM | POA: Diagnosis not present

## 2021-04-07 DIAGNOSIS — M4626 Osteomyelitis of vertebra, lumbar region: Secondary | ICD-10-CM | POA: Diagnosis not present

## 2021-04-07 DIAGNOSIS — I10 Essential (primary) hypertension: Secondary | ICD-10-CM | POA: Diagnosis not present

## 2021-04-07 DIAGNOSIS — K6812 Psoas muscle abscess: Secondary | ICD-10-CM | POA: Diagnosis not present

## 2021-04-07 DIAGNOSIS — Z7401 Bed confinement status: Secondary | ICD-10-CM | POA: Diagnosis not present

## 2021-04-07 DIAGNOSIS — F1120 Opioid dependence, uncomplicated: Secondary | ICD-10-CM | POA: Diagnosis not present

## 2021-04-07 DIAGNOSIS — D649 Anemia, unspecified: Secondary | ICD-10-CM | POA: Diagnosis not present

## 2021-04-07 DIAGNOSIS — Z992 Dependence on renal dialysis: Secondary | ICD-10-CM | POA: Diagnosis not present

## 2021-04-07 DIAGNOSIS — M545 Low back pain, unspecified: Secondary | ICD-10-CM | POA: Diagnosis not present

## 2021-04-07 DIAGNOSIS — K3184 Gastroparesis: Secondary | ICD-10-CM | POA: Diagnosis not present

## 2021-04-07 DIAGNOSIS — N186 End stage renal disease: Secondary | ICD-10-CM | POA: Diagnosis not present

## 2021-04-07 DIAGNOSIS — I959 Hypotension, unspecified: Secondary | ICD-10-CM | POA: Diagnosis not present

## 2021-04-07 DIAGNOSIS — I120 Hypertensive chronic kidney disease with stage 5 chronic kidney disease or end stage renal disease: Secondary | ICD-10-CM | POA: Diagnosis not present

## 2021-04-07 DIAGNOSIS — E8779 Other fluid overload: Secondary | ICD-10-CM | POA: Diagnosis not present

## 2021-04-07 LAB — RENAL FUNCTION PANEL
Albumin: 2.5 g/dL — ABNORMAL LOW (ref 3.5–5.0)
Anion gap: 10 (ref 5–15)
BUN: 24 mg/dL — ABNORMAL HIGH (ref 8–23)
CO2: 27 mmol/L (ref 22–32)
Calcium: 9.7 mg/dL (ref 8.9–10.3)
Chloride: 94 mmol/L — ABNORMAL LOW (ref 98–111)
Creatinine, Ser: 4.59 mg/dL — ABNORMAL HIGH (ref 0.61–1.24)
GFR, Estimated: 13 mL/min — ABNORMAL LOW (ref >60)
Glucose, Bld: 86 mg/dL (ref 70–99)
Phosphorus: 3.4 mg/dL (ref 2.5–4.6)
Potassium: 4.3 mmol/L (ref 3.5–5.1)
Sodium: 131 mmol/L — ABNORMAL LOW (ref 135–145)

## 2021-04-07 MED ORDER — FENTANYL 50 MCG/HR TD PT72
1.0000 | MEDICATED_PATCH | TRANSDERMAL | Status: DC
  Administered 2021-04-07: 1 via TRANSDERMAL
  Filled 2021-04-07: qty 1

## 2021-04-07 MED ORDER — DICLOFENAC EPOLAMINE 1.3 % EX PTCH: 1.0000 | MEDICATED_PATCH | Freq: Two times a day (BID) | CUTANEOUS | Status: DC

## 2021-04-07 MED ORDER — CYCLOBENZAPRINE HCL 5 MG PO TABS
5.0000 mg | ORAL_TABLET | Freq: Three times a day (TID) | ORAL | 0 refills | Status: DC
Start: 2021-04-07 — End: 2022-06-05

## 2021-04-07 MED ORDER — SENNOSIDES-DOCUSATE SODIUM 8.6-50 MG PO TABS: 2.0000 | ORAL_TABLET | Freq: Two times a day (BID) | ORAL | Status: DC

## 2021-04-07 MED ORDER — FENTANYL 50 MCG/HR TD PT72: 1.0000 | MEDICATED_PATCH | TRANSDERMAL | 0 refills | Status: DC

## 2021-04-07 MED ORDER — ACETAMINOPHEN 325 MG PO TABS: 650.0000 mg | ORAL_TABLET | Freq: Four times a day (QID) | ORAL | Status: AC | PRN

## 2021-04-07 MED ORDER — GABAPENTIN 100 MG PO CAPS: 100.0000 mg | ORAL_CAPSULE | Freq: Three times a day (TID) | ORAL | Status: DC

## 2021-04-07 MED ORDER — LIDOCAINE 5 % EX PTCH: 1.0000 | MEDICATED_PATCH | CUTANEOUS | 0 refills | Status: DC

## 2021-04-07 MED ORDER — CEFAZOLIN SODIUM-DEXTROSE 2-4 GM/100ML-% IV SOLN: 2.0000 g | INTRAVENOUS | Status: AC

## 2021-04-07 MED ORDER — HYDROCODONE-ACETAMINOPHEN 10-325 MG PO TABS: 1.0000 | ORAL_TABLET | ORAL | 0 refills | Status: AC | PRN

## 2021-04-07 NOTE — Plan of Care (Signed)

## 2021-04-07 NOTE — TOC Transition Note (Signed)
Transition of Care [facility]) - CM/SW Discharge Note   Patient Details  Name: Ryan Wells MRN: [id number] Date of Birth: [birth year]  [facility] CM/SW Contact:  Coralee Pesa, [place] Phone Number: 04/07/2021, 11:42 AM   Clinical Narrative:    Pt to be transported to [place] via [place]. Nurse to call report to [phone]. Rm# [id number].   Final next level of care: [place] Barriers to Discharge: Barriers Resolved   Patient Goals and CMS Choice Patient states their goals for this hospitalization and ongoing recovery are:: "recover completely"      Discharge Placement              Patient chooses bed at: [facility] Patient to be transferred to facility by: [place] Name of family member notified: [place] Patient and family notified of of transfer: 04/07/21  Discharge Plan and Services     Post Acute Care Choice: IP Rehab                               Social Determinants of Health (SDOH) Interventions     Readmission Risk Interventions No flowsheet data found.

## 2021-04-07 NOTE — Discharge Summary (Signed)
Physician Discharge Summary  WILBOR SCHMIESING [id number] DOB: 02-16-1956 DOA: 03/14/2021  PCP: Pcp, No  Admit date: 03/14/2021 Discharge date: 04/07/2021  Admitted From: Home Disposition: Skilled nursing facility  Recommendations for Outpatient Follow-up:  Hemodialysis needs to continue Monday Wednesday Friday Antibiotics with hemodialysis until 9/19  Home Health: Not applicable Equipment/Devices: Not applicable  Discharge Condition: Stable CODE STATUS: Full code Diet recommendation: Low-salt diet  Discharge summary: Patient is going to a skilled nursing facility after extensive hospitalization.  His previous history from the records, chart review compiled as below with further plan of action.  ISLAM DELLACROCE is a 65 y.o. male with medical history of end-stage renal disease on hemodialysis, essential hypertension, diabetes mellitus type 2, anemia of chronic kidney disease,  presented to the hospital with back and leg pain worsening for 2 weeks. 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  He had completed 6 weeks of IV antibiotics with dialysis per ID notes from 8/2.  Patient continued to have worsening symptoms so he came back to the ED.  During hospitalization, patient persisted to have lower back pain.  He has been  adjusted on  pain medication regimen with variable control of his pain.  Acceptable pain control achieved.  He was able to sit up in a dialysis chair now.  Assessment and plan of care  Lumbar/sacral osteomyelitis/discitis, L5-S1, Septic arthritis L5-S1, and on admission, left psoas  abscess s/p IR aspiration Patient presented with worsening lumbar back pain with radiation of pain.  History of MSSA bacteremia on 03/13/2021.  Currently afebrile with no leukocytosis. MRI of the lumbosacral spine on 03/16/21 was repeated which was notable for L5-S1 discitis with osteomyelitis with left psoas abscess.  IR drained 60 mL of purulent fluid from the left psoas region.  Neurosurgery and infectious disease followed the patient during hospitalization.  Repeat blood cultures have been negative patient is afebrile without leukocytosis.  Fluid culture from aspiration showed Staph aureus sensitive to all antibiotics.   Plan to continue cefazolin with hemodialysis for 6 to 8 weeks.  Tentative end date of antibiotics 04/30/2021 as per infectious disease.   Intractable back pain.   Patient remained pretty debilitated with severe back pain and spasm and unable to sit up for dialysis. We continue to work on adjusting his pain medications and able to sit up in a dialysis chair for dialysis. 8/27, pain medication will be adjusted to Fentanyl patch 50 mcg/h Flexeril 5 mg 3 times daily Norco 10 mg every 4 hours as needed Lidocaine patch and diclofenac patch. Please monitor closely for altered mentation, drug overdose.  Gradually taper off on opiates.   ESRD on HD with hyperkalemia.:  Continue hemodialysis as per nephrology.  Now on a schedule Monday Wednesday Friday.  Receiving dialysis on his schedule.   Diabetes mellitus type 2:  Latest hemoglobin A1c of 5.1 on 01/16/2021.  Diet controlled.   History of MSSA bacteremia:  Has previously completed 6 weeks of antibiotic.   On IV cefazolin at this time.  Plan to continue IV antibiotic for 6 to 8 weeks as per ID recommendation.  End date 9/19.   Anemia chronic kidney disease:  Baseline  hemoglobin of around 8.0.  Hemoglobin is stable.   Subclinical hypothyroidism:  T4 normal.  TSH elevated.  Follow-up in 4 to 6 weeks   Weakness/deconditioning/gait  disturbance: Physical therapy on board and recommending nursing facility placement.    Patient is stable to transfer to skilled rehab.   Discharge Diagnoses:  Principal Problem:   Lumbar back pain Active Problems:   Subclinical hypothyroidism   Anemia due to chronic kidney disease   Diabetes mellitus type II, non insulin dependent (HCC)   ESRD on hemodialysis (HCC)   Pleural effusion   Elevated troponin   Leg pain   Leukocytosis   History of bacteremia    Discharge Instructions  Discharge Instructions     Diet general   Complete by: As directed    Nectar thick liquid   Increase activity slowly   Complete by: As directed    No wound care   Complete by: As directed       Allergies as of 04/07/2021   No Known Allergies      Medication List     STOP taking these medications    oxyCODONE-acetaminophen 5-325 MG tablet Commonly known as: PERCOCET/ROXICET   predniSONE 20 MG tablet Commonly known as: DELTASONE       TAKE these medications    acetaminophen 325 MG tablet Commonly known as: TYLENOL Take 2 tablets (650 mg total) by mouth every 6 (six) hours as needed for mild pain (or Fever >/= 101).   ceFAZolin 2-4 GM/100ML-% IVPB Commonly known as: ANCEF Inject 100 mLs (2 g total) into the vein every Monday, Wednesday, and Friday at 8 PM for 21 days. Start taking on: April 09, 2021 What changed: when to take this   cyclobenzaprine 5 MG tablet Commonly known as: FLEXERIL Take 1 tablet (5 mg total) by mouth 3 (three) times daily.   Darbepoetin Alfa 60 MCG/0.3ML Sosy injection Commonly known as: ARANESP Inject 0.3 mLs (60 mcg total) into the vein every Friday with hemodialysis.   diclofenac 1.3 % Ptch Commonly known as: FLECTOR Place 1 patch onto the skin 2 (two) times daily.   doxercalciferol 4 MCG/2ML injection Commonly known as: HECTOROL Inject 2 mLs (4 mcg total) into the vein every Monday, Wednesday, and Friday with hemodialysis.   fentaNYL 50  MCG/HR Commonly known as: [place] 1 patch onto the skin every 3 (three) days.   gabapentin 100 MG capsule Commonly known as: NEURONTIN Take 1 capsule (100 mg total) by mouth 3 (three) times daily.   HYDROcodone-acetaminophen 10-325 MG tablet Commonly known as: NORCO Take 1 tablet by mouth every 4 (four) hours as needed for up to 5 days for moderate pain or severe pain.   lidocaine 5 % Commonly known as: LIDODERM Place 1 patch onto the skin daily. Remove & Discard patch within 12 hours or as directed by MD   midodrine 10 MG tablet Commonly known as: PROAMATINE Take 1 tablet (10 mg total) by mouth 3 (three) times daily with meals.   pentafluoroprop-tetrafluoroeth Aero Commonly known as: GEBAUERS Apply 1 application topically as needed (topical anesthesia for hemodialysis).   senna-docusate 8.6-50 MG tablet Commonly known as: Senokot-S Take 2 tablets by mouth 2 (two) times daily.   Velphoro 500 MG chewable tablet Generic drug: sucroferric oxyhydroxide Chew 1,000 mg by mouth 3 (three) times daily with meals.        Follow-up Information     Marvis Moeller, NP Follow up in 2 month(s).   Specialty: Neurosurgery Contact  information: [address] [place] [address] [phone]                No Known Allergies  Consultations: Neurosurgery Interventional radiology Infectious disease   Procedures/Studies: DG Chest 1 View  Result Date: 03/15/2021 CLINICAL DATA:  65 year old male with generalized pain. EXAM: CHEST  1 VIEW COMPARISON:  Chest radiograph dated 01/28/2021. FINDINGS: Cardiomegaly with vascular congestion. Focal area of subpleural nodularity along the lateral right mid lung field likely corresponds to the previously seen cavitary lesion. Overall the previously seen nodules are less conspicuous on today's study. There is a small right pleural effusion or pleural thickening. No pneumothorax. No acute osseous pathology.  Degenerative changes of the spine. Partially visualized cervical ACDF and left axillary vascular stent. No acute osseous pathology. IMPRESSION: 1. Cardiomegaly with vascular congestion. 2. Focal area of subpleural nodularity along the right mid lung field likely corresponds to the previously seen cavitary lesion. 3. Small right pleural effusion. Electronically Signed   By: Anner Crete M.D.   On: 03/15/2021 00:05   CT HEAD WO CONTRAST (5MM)  Result Date: 03/15/2021 CLINICAL DATA:  65 year old male with ocular pain and swelling over the left orbit. Facial trauma. EXAM: CT HEAD AND ORBITS WITHOUT CONTRAST TECHNIQUE: Contiguous axial images were obtained from the base of the skull through the vertex without contrast. Multidetector CT imaging of the orbits was performed using the standard protocol without intravenous contrast. COMPARISON:  None FINDINGS: CT HEAD FINDINGS Brain: There is mild age-related atrophy and chronic microvascular ischemic changes. There is no acute intracranial hemorrhage. No mass effect or midline shift. No extra-axial fluid collection. Vascular: No hyperdense vessel or unexpected calcification. Skull: Normal. Negative for fracture or focal lesion. Other: None CT ORBITS FINDINGS Evaluation of this exam is limited in the absence of intravenous contrast. Orbits: The globes and retro-orbital fat are preserved. No acute or traumatic injury. Visualized sinuses: There is mild diffuse mucoperiosteal thickening of paranasal sinuses. No air-fluid level. Mild bilateral mastoid effusions. Soft tissues: Left facial and periorbital soft tissue contusion. No large hematoma. IMPRESSION: 1. No acute intracranial pathology. 2. No acute/traumatic orbital pathology. 3. Left facial and periorbital soft tissue contusion. Electronically Signed   By: Anner Crete M.D.   On: 03/15/2021 00:34   CT PELVIS W CONTRAST  Result Date: 03/17/2021 CLINICAL DATA:  Abscess, anal or rectal psoas abcess EXAM: CT  PELVIS WITH CONTRAST TECHNIQUE: Multidetector CT imaging of the pelvis was performed using the standard protocol following the bolus administration of intravenous contrast. CONTRAST:  178m OMNIPAQUE IOHEXOL 300 MG/ML  SOLN COMPARISON:  CT abdomen pelvis 01/15/2021, MRI lumbar spine 03/16/2021 FINDINGS: Urinary Tract:  No abnormality visualized. Bowel: The visualized bowel loops are unremarkable. The visualized appendix is normal. There is moderate presacral edema likely related to the inflammatory process at L5-S1. No discrete drainable fluid collection within this region. Vascular/Lymphatic: The abdominal vasculature is unremarkable. No pathologic adenopathy within the pelvis. Reproductive:  Prostate gland is unremarkable. Other: Left psoas abscess is again identified measuring 21 x 23 x 46 mm in greatest dimension. Smaller 12 mm right psoas abscesses are present adjacent to L5-S1. Extensive paravertebral inflammatory stranding and soft tissue infiltration is seen at L5-S1 in keeping with changes of discitis osteomyelitis at this location. Associated endplate erosion is noted at this level. Small posterior collection abuts the thecal sac at this level, unchanged from prior exam. Musculoskeletal: Aside from inflammatory changes related to L5-S1 discitis osteomyelitis, there is again noted widening of  the left L5-S1 facet with cortical erosion in keeping with septic arthritis in this location. No other bone abnormality is identified. IMPRESSION: Discitis/osteomyelitis L5-S1 with bilateral psoas abscesses measuring up to 46 mm in greatest dimension on the left. Extensive paravertebral soft tissue inflammatory change with mild edema extending into the presacral space. Septic arthritis involving the left L5-S1 facet joint. Electronically Signed   By: Fidela Salisbury MD   On: 03/17/2021 15:52   MR LUMBAR SPINE WO CONTRAST  Result Date: 03/16/2021 CLINICAL DATA:  Low back pain, > 6 wks; Low back pain, infection  suspected. EXAM: MRI LUMBAR SPINE WITHOUT CONTRAST TECHNIQUE: Multiplanar, multisequence MR imaging of the lumbar spine was performed. No intravenous contrast was administered. COMPARISON:  MRI of the lumbar spine January 17, 2021. FINDINGS: Segmentation:  Standard. Alignment: Trace anterolisthesis of L4 over L5. Straightening of the lumbar lordosis Vertebrae: Interval development of prominent endplate erosion and edema at L5-S1 with fluid signal within the corresponding intervertebral disc, consistent with discitis/osteomyelitis. There is perivertebral soft tissue edema with a fluid collection within the left psoas muscle measuring approximately 4.7 x 2.5 x 2.1 cm, consistent with abscess. No definitive evidence of epidural fluid collection. Persistent marrow edema at the left L5-S1 facet joint, may also be related to septic arthritis. Conus medullaris and cauda equina: Conus extends to the L2 level. Conus and cauda equina appear normal. Paraspinal and other soft tissues: Left psoas abscess, as described above. Disc levels: T12-L1: No spinal canal or neural foraminal stenosis. L1-2: Shallow disc bulge and mild facet degenerative changes without significant spinal canal or neural foraminal stenosis. L2-3: Mild facet degenerative changes without significant spinal canal or neural foraminal stenosis. L3-4: Shallow disc bulge, mild right and moderate left facet degenerative changes resulting in mild-to-moderate left neural foraminal narrowing. L4-5: Disc bulge and advanced hypertrophic facet degenerative changes resulting in mild spinal canal stenosis and moderate bilateral neural foraminal narrowing, unchanged. L5-S1: Disc bulge, moderate right and advanced left facet degenerative changes resulting in narrowing of the bilateral subarticular zones and severe bilateral neural foraminal narrowing. IMPRESSION: 1. Findings consistent with L5-S1 discitis/osteomyelitis with associated left psoas abscess measuring up to 4.7 cm.  No epidural collection identified. 2. Persistent marrow and periarticular soft tissue edema at the left L5-S1 facet joint, may be related to septic arthritis. 3. Moderate bilateral neural foraminal narrowing at L4-5 and severe bilateral neural foraminal narrowing at L5-S1. 4. No high-grade spinal canal stenosis. Electronically Signed   By: Pedro Earls M.D.   On: 03/16/2021 16:29   CT ASPIRATION  Result Date: 03/19/2021 INDICATION: 65 year old male with osteomyelitis discitis of L5-S1, and small abscess on prior imaging referred for aspiration/possible drainage. EXAM: CT-GUIDED ASPIRATION OF LEFT PSOAS ABSCESS MEDICATIONS: NONE ANESTHESIA/SEDATION: Fentanyl 50 mcg IV; Versed 1.0 mg IV Moderate Sedation Time:  11 minutes The patient was continuously monitored during the procedure by the interventional radiology nurse under my direct supervision. COMPLICATIONS: None PROCEDURE: Informed written consent was obtained from the patient after a thorough discussion of the procedural risks, benefits and alternatives. All questions were addressed. Maximal Sterile Barrier Technique was utilized including caps, mask, sterile gowns, sterile gloves, sterile drape, hand hygiene and skin antiseptic. A timeout was performed prior to the initiation of the procedure. Patient positioned supine position on CT gantry table and scout CT was acquired for planning purposes. The patient was then prepped and draped in the usual sterile fashion. 1% lidocaine was used for local anesthesia. Using CT guidance, a Yueh needle was  advanced into the site of the psoas abscess of the left psoas muscle, via a trans mesenteric approach, anteriorly. With once we confirmed needle tip position, we aspirated approximately 6 cc of purulent material. Culture was sent. Needle was removed and a final CT was acquired. Patient tolerated the procedure well and remained hemodynamically stable throughout. No complications were encountered and no  significant blood loss. IMPRESSION: Status post CT-guided aspirate of small left psoas abscess for a culture. Signed, Dulcy Fanny. Dellia Nims, RPVI [facility] Electronically Signed   By: Corrie Mckusick D.O.   On: 03/19/2021 17:32   CT Orbits Wo Contrast  Result Date: 03/15/2021 CLINICAL DATA:  65 year old male with ocular pain and swelling over the left orbit. Facial trauma. EXAM: CT HEAD AND ORBITS WITHOUT CONTRAST TECHNIQUE: Contiguous axial images were obtained from the base of the skull through the vertex without contrast. Multidetector CT imaging of the orbits was performed using the standard protocol without intravenous contrast. COMPARISON:  None FINDINGS: CT HEAD FINDINGS Brain: There is mild age-related atrophy and chronic microvascular ischemic changes. There is no acute intracranial hemorrhage. No mass effect or midline shift. No extra-axial fluid collection. Vascular: No hyperdense vessel or unexpected calcification. Skull: Normal. Negative for fracture or focal lesion. Other: None CT ORBITS FINDINGS Evaluation of this exam is limited in the absence of intravenous contrast. Orbits: The globes and retro-orbital fat are preserved. No acute or traumatic injury. Visualized sinuses: There is mild diffuse mucoperiosteal thickening of paranasal sinuses. No air-fluid level. Mild bilateral mastoid effusions. Soft tissues: Left facial and periorbital soft tissue contusion. No large hematoma. IMPRESSION: 1. No acute intracranial pathology. 2. No acute/traumatic orbital pathology. 3. Left facial and periorbital soft tissue contusion. Electronically Signed   By: Anner Crete M.D.   On: 03/15/2021 00:34   (Echo, Carotid, EGD, Colonoscopy, ERCP)    Subjective: Patient seen and examined.  I used video interpreter for Spanish translation.  No symptoms at rest.  Still has significant pain on mobility and twisting of the back with some radiation to the leg.   He was able to sit up in the chair yesterday. We discussed about continuing pain medications, increasing dose of fentanyl and transferring him to rehab that he is agreeable.  No family at bedside.   Discharge Exam: Vitals:   04/06/21 2327 04/07/21 0613  BP: (!) 152/63 (!) 112/58  Pulse: 80 64  Resp: 16 16  Temp: 98.5 F (36.9 C) 98.3 F (36.8 C)  SpO2: 94% 97%   Vitals:   04/06/21 1524 04/06/21 1534 04/06/21 2327 04/07/21 0613  BP: (!) 146/70 (!) 143/65 (!) 152/63 (!) 112/58  Pulse: 67 67 80 64  Resp: '16 17 16 16  '$ Temp: 98.5 F (36.9 C) 98.5 F (36.9 C) 98.5 F (36.9 C) 98.3 F (36.8 C)  TempSrc: Oral Oral Oral Oral  SpO2:   94% 97%  Weight:  59.7 kg    Height:        General: Pt is alert, awake, not in acute distress Frail and debilitated.  Chronically sick looking.  Not in any distress.  On room air. Cardiovascular: RRR, S1/S2 +, no rubs, no gallops Respiratory: CTA bilaterally, no wheezing, no rhonchi Abdominal: Soft, NT, ND, bowel sounds + Extremities: no edema, no cyanosis Left upper extremity AV fistula present with thrill.    The results of significant diagnostics from this hospitalization (including imaging, microbiology, ancillary and laboratory) are listed below for reference.  Microbiology: Recent Results (from the past 240 hour(s))  SARS CORONAVIRUS 2 (TAT 6-24 HRS) Nasopharyngeal Nasopharyngeal Swab     Status: None   Collection Time: 04/06/21 11:12 AM   Specimen: Nasopharyngeal Swab  Result Value Ref Range Status   SARS Coronavirus 2 NEGATIVE NEGATIVE Final    Comment: (NOTE) SARS-CoV-2 target nucleic acids are NOT DETECTED.  The SARS-CoV-2 RNA is generally detectable in upper and lower respiratory specimens during the acute phase of infection. Negative results do not preclude SARS-CoV-2 infection, do not rule out co-infections with other pathogens, and should not be used as the sole basis for treatment or other patient management  decisions. Negative results must be combined with clinical observations, patient history, and epidemiological information. The expected result is Negative.  Fact Sheet for Patients: SugarRoll.be  Fact Sheet for Healthcare Providers: https://www.woods-mathews.com/  This test is not yet approved or cleared by the [place] FDA and  has been authorized for detection and/or diagnosis of SARS-CoV-2 by FDA under an Emergency Use Authorization (EUA). This EUA will remain  in effect (meaning this test can be used) for the duration of the COVID-19 declaration under Se ction 564(b)(1) of the Act, 21 U.S.C. section 360bbb-3(b)(1), unless the authorization is terminated or revoked sooner.  Performed at [facility], [place] [address], [place], [place] [address]      Labs: BNP (last 3 results) No results for input(s): BNP in the last 8760 hours. Basic Metabolic Panel: Recent Labs  Lab 04/03/21 0138 04/04/21 0540 04/05/21 0241 04/06/21 0348 04/07/21 0226  NA 135 131* 132* 132* 131*  K 4.6 5.0 4.0 4.6 4.3  CL 94* 90* 94* 90* 94*  CO2 33* 33* '30 28 27  '$ GLUCOSE 106* 112* 102* 116* 86  BUN 22 35* 23 39* 24*  CREATININE 4.28* 6.37* 4.72* 6.56* 4.59*  CALCIUM 9.2 9.9 9.9 10.4* 9.7  PHOS 2.2* 2.4* 2.2* 3.1 3.4   Liver Function Tests: Recent Labs  Lab 04/03/21 0138 04/04/21 0540 04/05/21 0241 04/06/21 0348 04/07/21 0226  ALBUMIN 2.7* 2.6* 2.6* 2.5* 2.5*   No results for input(s): LIPASE, AMYLASE in the last 168 hours. No results for input(s): AMMONIA in the last 168 hours. CBC: Recent Labs  Lab 04/01/21 0308 04/02/21 0830  WBC 6.1 6.9  HGB 9.1* 8.7*  HCT 31.3* 29.4*  MCV 101.3* 101.4*  PLT 382 390   Cardiac Enzymes: No results for input(s): CKTOTAL, CKMB, CKMBINDEX, TROPONINI in the last 168 hours. BNP: Invalid input(s): POCBNP CBG: Recent Labs  Lab 04/04/21 0936  GLUCAP 116*   D-Dimer No results for input(s):  DDIMER in the last 72 hours. Hgb A1c No results for input(s): HGBA1C in the last 72 hours. Lipid Profile No results for input(s): CHOL, HDL, LDLCALC, TRIG, CHOLHDL, LDLDIRECT in the last 72 hours. Thyroid function studies No results for input(s): TSH, T4TOTAL, T3FREE, THYROIDAB in the last 72 hours.  Invalid input(s): FREET3 Anemia work up No results for input(s): VITAMINB12, FOLATE, FERRITIN, TIBC, IRON, RETICCTPCT in the last 72 hours. Urinalysis    Component Value Date/Time   COLORURINE YELLOW 06/06/2019 1530   APPEARANCEUR CLEAR 06/06/2019 1530   LABSPEC 1.014 06/06/2019 1530   PHURINE 6.0 06/06/2019 1530   GLUCOSEU 150 (A) 06/06/2019 1530   HGBUR SMALL (A) 06/06/2019 1530   BILIRUBINUR NEGATIVE 06/06/2019 1530   BILIRUBINUR neg 12/19/2016 1512   KETONESUR NEGATIVE 06/06/2019 1530   PROTEINUR >=300 (A) 06/06/2019 1530   UROBILINOGEN 0.2 12/19/2016 1512   UROBILINOGEN 1.0  03/29/2010 1944   NITRITE NEGATIVE 06/06/2019 1530   LEUKOCYTESUR NEGATIVE 06/06/2019 1530   Sepsis Labs Invalid input(s): PROCALCITONIN,  WBC,  LACTICIDVEN Microbiology Recent Results (from the past 240 hour(s))  SARS CORONAVIRUS 2 (TAT 6-24 HRS) Nasopharyngeal Nasopharyngeal Swab     Status: None   Collection Time: 04/06/21 11:12 AM   Specimen: Nasopharyngeal Swab  Result Value Ref Range Status   SARS Coronavirus 2 NEGATIVE NEGATIVE Final    Comment: (NOTE) SARS-CoV-2 target nucleic acids are NOT DETECTED.  The SARS-CoV-2 RNA is generally detectable in upper and lower respiratory specimens during the acute phase of infection. Negative results do not preclude SARS-CoV-2 infection, do not rule out co-infections with other pathogens, and should not be used as the sole basis for treatment or other patient management decisions. Negative results must be combined with clinical observations, patient history, and epidemiological information. The expected result is Negative.  Fact Sheet for  Patients: SugarRoll.be  Fact Sheet for Healthcare Providers: https://www.woods-mathews.com/  This test is not yet approved or cleared by the [place] FDA and  has been authorized for detection and/or diagnosis of SARS-CoV-2 by FDA under an Emergency Use Authorization (EUA). This EUA will remain  in effect (meaning this test can be used) for the duration of the COVID-19 declaration under Se ction 564(b)(1) of the Act, 21 U.S.C. section 360bbb-3(b)(1), unless the authorization is terminated or revoked sooner.  Performed at [facility], [place] [address], [place], [place] [address]      Time coordinating discharge:  35 minutes  SIGNED:   Barb Merino, MD  [facility] 04/07/2021, 11:44 AM

## 2021-04-07 NOTE — Progress Notes (Signed)
[facility] Progress Note  Subjective: stable, seen in room, no HD problems yest  Vitals:   04/06/21 1524 04/06/21 1534 04/06/21 2327 04/07/21 0613  BP: (!) 146/70 (!) 143/65 (!) 152/63 (!) 112/58  Pulse: 67 67 80 64  Resp: '16 17 16 16  '$ Temp: 98.5 F (36.9 C) 98.5 F (36.9 C) 98.5 F (36.9 C) 98.3 F (36.8 C)  TempSrc: Oral Oral Oral Oral  SpO2:   94% 97%  Weight:  59.7 kg    Height:        Exam: General: NAD Heart: RRR Lungs: clear bilaterally Abdomen:Soft, non-tender, (+) bowel sounds Extremities:No edema BLLE Dialysis Access: L forearm AVF (+) Bruit/Thrill     [facility] HD: [facility] M[facility]    3h 37mn  64kg  2/2 bath P5  AVF Hep 2200 -Mircera 150 mcg IV q 2 week (last dose 150 mcg IV 02/28/2021) -Venofer 100 mg IV X 10 doses (4/6 doses given. 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 IR did CT guided aspiration of 60 cc on 8/8, site too small for drain placement per IR.  Per ID, Continue cefazolin with HD, plan for 6 to 8 weeks course. Tentative end date is 04/30/21. ESRD - Back on MWF schedule. HD Friday, shortened Rx d/t to staffing issues Recent MSSA bacteremia - had previously been rx'd w/ 6 wks IV abx for bacteremia w/ pulm septic emboli and negative TEE. Had LUA AVF pain and erythema but imaging was neg for abscess and symptoms resolved.  Completed abx course on 02/22/21. .Marland Kitchen Dispostion - has been able to sit in the chair for HD here. For dc today.  Hypertension/volume  - As noted above. BP well controlled. Continue midodrine 10 mg PO TIW pre HD. Stable vol, has prob lost weight , new edw 62-63kg.    Anemia  - HGB 9.9.  Missed OP ESA 08/03. On aranesp 100 mcg q Monday here. Metabolic bone disease - Continue velphoro 500 mg 2 tabs PO TID AC. Continue VDRA. Labs at goal.  Nutrition - Renal carb mod diet, nepro, renal vits. DM-No  meds on OP list. Last HA1c 6.9. per primary Disposition - for DC to SNF today     Rob Orian Figueira 04/07/2021, 11:33 AM   Recent Labs  Lab 04/01/21 0308 04/02/21 0414 04/02/21 0830 04/03/21 0138 04/06/21 0348 04/07/21 0226  K 4.5   < > 5.2*   < > 4.6 4.3  BUN 30*   < > 43*   < > 39* 24*  CREATININE 4.53*   < > 6.60*   < > 6.56* 4.59*  CALCIUM 9.2   < > 9.8   < > 10.4* 9.7  PHOS 2.7   < > 2.7   < > 3.1 3.4  HGB 9.1*  --  8.7*  --   --   --    < > = values in this interval not displayed.    Inpatient medications:  Chlorhexidine Gluconate Cloth  6 each Topical Q0600   cyclobenzaprine  5 mg Oral TID   darbepoetin (ARANESP) injection - DIALYSIS  100 mcg Intravenous Q Mon-HD   diclofenac  1 patch Transdermal BID   doxercalciferol  3 mcg Intravenous Q M,W,F-HD   feeding supplement (NEPRO CARB STEADY)  237 mL Oral BID BM   fentaNYL  1 patch Transdermal Q72H   gabapentin  100 mg Oral TID   heparin  5,000 Units Subcutaneous Q8H   lidocaine  1 patch Transdermal Q24H   midodrine  10 mg Oral Q M,W,F-HD   multivitamin  1 tablet Oral QHS   polyethylene glycol  17 g Oral BID   senna-docusate  2 tablet Oral BID   sodium chloride flush  3 mL Intravenous Q12H   sucroferric oxyhydroxide  1,000 mg Oral TID WC     ceFAZolin (ANCEF) IV 2 g (04/06/21 2001)   acetaminophen **OR** acetaminophen, albuterol, HYDROcodone-acetaminophen, hydrOXYzine

## 2021-04-07 NOTE — Progress Notes (Signed)
Pt discharged to [facility] via [place]. 2 phone calls attempts made to [facility] with no answer.

## 2021-04-09 DIAGNOSIS — N2581 Secondary hyperparathyroidism of renal origin: Secondary | ICD-10-CM | POA: Diagnosis not present

## 2021-04-09 DIAGNOSIS — F1120 Opioid dependence, uncomplicated: Secondary | ICD-10-CM | POA: Diagnosis not present

## 2021-04-09 DIAGNOSIS — Z992 Dependence on renal dialysis: Secondary | ICD-10-CM | POA: Diagnosis not present

## 2021-04-09 DIAGNOSIS — I714 Abdominal aortic aneurysm, without rupture: Secondary | ICD-10-CM | POA: Diagnosis not present

## 2021-04-09 DIAGNOSIS — D638 Anemia in other chronic diseases classified elsewhere: Secondary | ICD-10-CM | POA: Diagnosis not present

## 2021-04-09 DIAGNOSIS — M4626 Osteomyelitis of vertebra, lumbar region: Secondary | ICD-10-CM | POA: Diagnosis not present

## 2021-04-09 DIAGNOSIS — N186 End stage renal disease: Secondary | ICD-10-CM | POA: Diagnosis not present

## 2021-04-09 DIAGNOSIS — M4646 Discitis, unspecified, lumbar region: Secondary | ICD-10-CM | POA: Diagnosis not present

## 2021-04-09 DIAGNOSIS — K6812 Psoas muscle abscess: Secondary | ICD-10-CM | POA: Diagnosis not present

## 2021-04-09 DIAGNOSIS — Z8679 Personal history of other diseases of the circulatory system: Secondary | ICD-10-CM | POA: Diagnosis not present

## 2021-04-11 DIAGNOSIS — N186 End stage renal disease: Secondary | ICD-10-CM | POA: Diagnosis not present

## 2021-04-11 DIAGNOSIS — I150 Renovascular hypertension: Secondary | ICD-10-CM | POA: Diagnosis not present

## 2021-04-11 DIAGNOSIS — Z992 Dependence on renal dialysis: Secondary | ICD-10-CM | POA: Diagnosis not present

## 2021-04-11 DIAGNOSIS — N2581 Secondary hyperparathyroidism of renal origin: Secondary | ICD-10-CM | POA: Diagnosis not present

## 2021-04-11 DIAGNOSIS — M549 Dorsalgia, unspecified: Secondary | ICD-10-CM | POA: Diagnosis not present

## 2021-04-13 DIAGNOSIS — N186 End stage renal disease: Secondary | ICD-10-CM | POA: Diagnosis not present

## 2021-04-13 DIAGNOSIS — N2581 Secondary hyperparathyroidism of renal origin: Secondary | ICD-10-CM | POA: Diagnosis not present

## 2021-04-13 DIAGNOSIS — Z992 Dependence on renal dialysis: Secondary | ICD-10-CM | POA: Diagnosis not present

## 2021-04-16 DIAGNOSIS — N2581 Secondary hyperparathyroidism of renal origin: Secondary | ICD-10-CM | POA: Diagnosis not present

## 2021-04-16 DIAGNOSIS — Z992 Dependence on renal dialysis: Secondary | ICD-10-CM | POA: Diagnosis not present

## 2021-04-16 DIAGNOSIS — N186 End stage renal disease: Secondary | ICD-10-CM | POA: Diagnosis not present

## 2021-04-18 DIAGNOSIS — N186 End stage renal disease: Secondary | ICD-10-CM | POA: Diagnosis not present

## 2021-04-18 DIAGNOSIS — N2581 Secondary hyperparathyroidism of renal origin: Secondary | ICD-10-CM | POA: Diagnosis not present

## 2021-04-18 DIAGNOSIS — Z992 Dependence on renal dialysis: Secondary | ICD-10-CM | POA: Diagnosis not present

## 2021-04-20 DIAGNOSIS — M549 Dorsalgia, unspecified: Secondary | ICD-10-CM | POA: Diagnosis not present

## 2021-04-20 DIAGNOSIS — F1120 Opioid dependence, uncomplicated: Secondary | ICD-10-CM | POA: Diagnosis not present

## 2021-04-20 DIAGNOSIS — N2581 Secondary hyperparathyroidism of renal origin: Secondary | ICD-10-CM | POA: Diagnosis not present

## 2021-04-20 DIAGNOSIS — N186 End stage renal disease: Secondary | ICD-10-CM | POA: Diagnosis not present

## 2021-04-20 DIAGNOSIS — Z992 Dependence on renal dialysis: Secondary | ICD-10-CM | POA: Diagnosis not present

## 2021-04-23 ENCOUNTER — Inpatient Hospital Stay: Payer: Medicare HMO | Admitting: Infectious Diseases

## 2021-04-23 DIAGNOSIS — Z992 Dependence on renal dialysis: Secondary | ICD-10-CM | POA: Diagnosis not present

## 2021-04-23 DIAGNOSIS — N2581 Secondary hyperparathyroidism of renal origin: Secondary | ICD-10-CM | POA: Diagnosis not present

## 2021-04-23 DIAGNOSIS — N186 End stage renal disease: Secondary | ICD-10-CM | POA: Diagnosis not present

## 2021-04-25 DIAGNOSIS — Z992 Dependence on renal dialysis: Secondary | ICD-10-CM | POA: Diagnosis not present

## 2021-04-25 DIAGNOSIS — N2581 Secondary hyperparathyroidism of renal origin: Secondary | ICD-10-CM | POA: Diagnosis not present

## 2021-04-25 DIAGNOSIS — N186 End stage renal disease: Secondary | ICD-10-CM | POA: Diagnosis not present

## 2021-04-27 DIAGNOSIS — N186 End stage renal disease: Secondary | ICD-10-CM | POA: Diagnosis not present

## 2021-04-27 DIAGNOSIS — N2581 Secondary hyperparathyroidism of renal origin: Secondary | ICD-10-CM | POA: Diagnosis not present

## 2021-04-27 DIAGNOSIS — Z992 Dependence on renal dialysis: Secondary | ICD-10-CM | POA: Diagnosis not present

## 2021-04-30 ENCOUNTER — Inpatient Hospital Stay: Payer: Medicare HMO | Admitting: Infectious Diseases

## 2021-04-30 DIAGNOSIS — N186 End stage renal disease: Secondary | ICD-10-CM | POA: Diagnosis not present

## 2021-04-30 DIAGNOSIS — N2581 Secondary hyperparathyroidism of renal origin: Secondary | ICD-10-CM | POA: Diagnosis not present

## 2021-04-30 DIAGNOSIS — Z992 Dependence on renal dialysis: Secondary | ICD-10-CM | POA: Diagnosis not present

## 2021-05-02 DIAGNOSIS — N186 End stage renal disease: Secondary | ICD-10-CM | POA: Diagnosis not present

## 2021-05-02 DIAGNOSIS — N2581 Secondary hyperparathyroidism of renal origin: Secondary | ICD-10-CM | POA: Diagnosis not present

## 2021-05-02 DIAGNOSIS — Z992 Dependence on renal dialysis: Secondary | ICD-10-CM | POA: Diagnosis not present

## 2021-05-04 DIAGNOSIS — Z992 Dependence on renal dialysis: Secondary | ICD-10-CM | POA: Diagnosis not present

## 2021-05-04 DIAGNOSIS — N2581 Secondary hyperparathyroidism of renal origin: Secondary | ICD-10-CM | POA: Diagnosis not present

## 2021-05-04 DIAGNOSIS — N186 End stage renal disease: Secondary | ICD-10-CM | POA: Diagnosis not present

## 2021-05-07 DIAGNOSIS — Z992 Dependence on renal dialysis: Secondary | ICD-10-CM | POA: Diagnosis not present

## 2021-05-07 DIAGNOSIS — N186 End stage renal disease: Secondary | ICD-10-CM | POA: Diagnosis not present

## 2021-05-07 DIAGNOSIS — N2581 Secondary hyperparathyroidism of renal origin: Secondary | ICD-10-CM | POA: Diagnosis not present

## 2021-05-09 DIAGNOSIS — N186 End stage renal disease: Secondary | ICD-10-CM | POA: Diagnosis not present

## 2021-05-09 DIAGNOSIS — Z992 Dependence on renal dialysis: Secondary | ICD-10-CM | POA: Diagnosis not present

## 2021-05-09 DIAGNOSIS — N2581 Secondary hyperparathyroidism of renal origin: Secondary | ICD-10-CM | POA: Diagnosis not present

## 2021-05-11 DIAGNOSIS — N186 End stage renal disease: Secondary | ICD-10-CM | POA: Diagnosis not present

## 2021-05-11 DIAGNOSIS — N2581 Secondary hyperparathyroidism of renal origin: Secondary | ICD-10-CM | POA: Diagnosis not present

## 2021-05-11 DIAGNOSIS — E1122 Type 2 diabetes mellitus with diabetic chronic kidney disease: Secondary | ICD-10-CM | POA: Diagnosis not present

## 2021-05-11 DIAGNOSIS — Z992 Dependence on renal dialysis: Secondary | ICD-10-CM | POA: Diagnosis not present

## 2021-05-14 DIAGNOSIS — Z992 Dependence on renal dialysis: Secondary | ICD-10-CM | POA: Diagnosis not present

## 2021-05-14 DIAGNOSIS — N2581 Secondary hyperparathyroidism of renal origin: Secondary | ICD-10-CM | POA: Diagnosis not present

## 2021-05-14 DIAGNOSIS — N186 End stage renal disease: Secondary | ICD-10-CM | POA: Diagnosis not present

## 2021-05-16 DIAGNOSIS — N2581 Secondary hyperparathyroidism of renal origin: Secondary | ICD-10-CM | POA: Diagnosis not present

## 2021-05-16 DIAGNOSIS — N186 End stage renal disease: Secondary | ICD-10-CM | POA: Diagnosis not present

## 2021-05-16 DIAGNOSIS — Z992 Dependence on renal dialysis: Secondary | ICD-10-CM | POA: Diagnosis not present

## 2021-05-18 DIAGNOSIS — K6812 Psoas muscle abscess: Secondary | ICD-10-CM | POA: Diagnosis not present

## 2021-05-18 DIAGNOSIS — Z992 Dependence on renal dialysis: Secondary | ICD-10-CM | POA: Diagnosis not present

## 2021-05-18 DIAGNOSIS — N186 End stage renal disease: Secondary | ICD-10-CM | POA: Diagnosis not present

## 2021-05-18 DIAGNOSIS — E119 Type 2 diabetes mellitus without complications: Secondary | ICD-10-CM | POA: Diagnosis not present

## 2021-05-18 DIAGNOSIS — M4626 Osteomyelitis of vertebra, lumbar region: Secondary | ICD-10-CM | POA: Diagnosis not present

## 2021-05-18 DIAGNOSIS — M549 Dorsalgia, unspecified: Secondary | ICD-10-CM | POA: Diagnosis not present

## 2021-05-18 DIAGNOSIS — Z8679 Personal history of other diseases of the circulatory system: Secondary | ICD-10-CM | POA: Diagnosis not present

## 2021-05-18 DIAGNOSIS — R5381 Other malaise: Secondary | ICD-10-CM | POA: Diagnosis not present

## 2021-05-18 DIAGNOSIS — D638 Anemia in other chronic diseases classified elsewhere: Secondary | ICD-10-CM | POA: Diagnosis not present

## 2021-05-18 DIAGNOSIS — N2581 Secondary hyperparathyroidism of renal origin: Secondary | ICD-10-CM | POA: Diagnosis not present

## 2021-05-21 DIAGNOSIS — N186 End stage renal disease: Secondary | ICD-10-CM | POA: Diagnosis not present

## 2021-05-21 DIAGNOSIS — Z992 Dependence on renal dialysis: Secondary | ICD-10-CM | POA: Diagnosis not present

## 2021-05-21 DIAGNOSIS — N2581 Secondary hyperparathyroidism of renal origin: Secondary | ICD-10-CM | POA: Diagnosis not present

## 2021-05-22 ENCOUNTER — Other Ambulatory Visit: Payer: Self-pay

## 2021-05-22 DIAGNOSIS — M549 Dorsalgia, unspecified: Secondary | ICD-10-CM | POA: Diagnosis not present

## 2021-05-22 DIAGNOSIS — R7881 Bacteremia: Secondary | ICD-10-CM | POA: Diagnosis not present

## 2021-05-22 DIAGNOSIS — M545 Low back pain, unspecified: Secondary | ICD-10-CM | POA: Diagnosis not present

## 2021-05-22 DIAGNOSIS — Z992 Dependence on renal dialysis: Secondary | ICD-10-CM | POA: Diagnosis not present

## 2021-05-22 DIAGNOSIS — N186 End stage renal disease: Secondary | ICD-10-CM | POA: Diagnosis not present

## 2021-05-22 DIAGNOSIS — D649 Anemia, unspecified: Secondary | ICD-10-CM | POA: Diagnosis not present

## 2021-05-22 DIAGNOSIS — E118 Type 2 diabetes mellitus with unspecified complications: Secondary | ICD-10-CM | POA: Diagnosis not present

## 2021-05-22 DIAGNOSIS — E038 Other specified hypothyroidism: Secondary | ICD-10-CM | POA: Diagnosis not present

## 2021-05-22 DIAGNOSIS — M4627 Osteomyelitis of vertebra, lumbosacral region: Secondary | ICD-10-CM | POA: Diagnosis not present

## 2021-05-22 DIAGNOSIS — R269 Unspecified abnormalities of gait and mobility: Secondary | ICD-10-CM | POA: Diagnosis not present

## 2021-05-22 NOTE — Patient Outreach (Signed)
[facility]) Care Management  05/22/2021  Ryan Wells 1955/09/03 [id number]     Transition of Care Referral  Referral Date: 05/22/2021  Referral Source:Discharge Report Date of Discharge: 05/20/2021 Facility: [facility] attempt # 1 to patient. Spoke with patient who does not speak English well but was able to give consent to speak with son-Ron who was in the home. He reports that patient is doing fairly well since returning home. He has a lot of family support to assist him. He has MD f/u appt this afternoon but unable to provide name of MD-states his mother has info on paperwork. They confirm that meds were picked up and pt taking them accordingly. [facility] services reviewed and discussed and follow up phone calls. Son declined ongoing follow up calls but was agreeable to getting RN CM contact info to call if needed.     Plan: RN CM will close referral.    Enzo Montgomery, RN,BSN,CCM [facility] Telephonic Care Management Coordinator Direct Phone: [phone] Toll Free: [phone] Fax: [phone]

## 2021-05-23 DIAGNOSIS — N186 End stage renal disease: Secondary | ICD-10-CM | POA: Diagnosis not present

## 2021-05-23 DIAGNOSIS — Z992 Dependence on renal dialysis: Secondary | ICD-10-CM | POA: Diagnosis not present

## 2021-05-23 DIAGNOSIS — N2581 Secondary hyperparathyroidism of renal origin: Secondary | ICD-10-CM | POA: Diagnosis not present

## 2021-05-25 DIAGNOSIS — Z992 Dependence on renal dialysis: Secondary | ICD-10-CM | POA: Diagnosis not present

## 2021-05-25 DIAGNOSIS — N186 End stage renal disease: Secondary | ICD-10-CM | POA: Diagnosis not present

## 2021-05-25 DIAGNOSIS — N2581 Secondary hyperparathyroidism of renal origin: Secondary | ICD-10-CM | POA: Diagnosis not present

## 2021-05-28 DIAGNOSIS — N186 End stage renal disease: Secondary | ICD-10-CM | POA: Diagnosis not present

## 2021-05-28 DIAGNOSIS — Z992 Dependence on renal dialysis: Secondary | ICD-10-CM | POA: Diagnosis not present

## 2021-05-28 DIAGNOSIS — N2581 Secondary hyperparathyroidism of renal origin: Secondary | ICD-10-CM | POA: Diagnosis not present

## 2021-05-30 DIAGNOSIS — Z992 Dependence on renal dialysis: Secondary | ICD-10-CM | POA: Diagnosis not present

## 2021-05-30 DIAGNOSIS — N186 End stage renal disease: Secondary | ICD-10-CM | POA: Diagnosis not present

## 2021-05-30 DIAGNOSIS — N2581 Secondary hyperparathyroidism of renal origin: Secondary | ICD-10-CM | POA: Diagnosis not present

## 2021-06-01 DIAGNOSIS — Z992 Dependence on renal dialysis: Secondary | ICD-10-CM | POA: Diagnosis not present

## 2021-06-01 DIAGNOSIS — N2581 Secondary hyperparathyroidism of renal origin: Secondary | ICD-10-CM | POA: Diagnosis not present

## 2021-06-01 DIAGNOSIS — N186 End stage renal disease: Secondary | ICD-10-CM | POA: Diagnosis not present

## 2021-06-04 DIAGNOSIS — Z992 Dependence on renal dialysis: Secondary | ICD-10-CM | POA: Diagnosis not present

## 2021-06-04 DIAGNOSIS — N2581 Secondary hyperparathyroidism of renal origin: Secondary | ICD-10-CM | POA: Diagnosis not present

## 2021-06-04 DIAGNOSIS — N186 End stage renal disease: Secondary | ICD-10-CM | POA: Diagnosis not present

## 2021-06-05 DIAGNOSIS — E1122 Type 2 diabetes mellitus with diabetic chronic kidney disease: Secondary | ICD-10-CM | POA: Diagnosis not present

## 2021-06-05 DIAGNOSIS — D631 Anemia in chronic kidney disease: Secondary | ICD-10-CM | POA: Diagnosis not present

## 2021-06-05 DIAGNOSIS — E1151 Type 2 diabetes mellitus with diabetic peripheral angiopathy without gangrene: Secondary | ICD-10-CM | POA: Diagnosis not present

## 2021-06-05 DIAGNOSIS — M4627 Osteomyelitis of vertebra, lumbosacral region: Secondary | ICD-10-CM | POA: Diagnosis not present

## 2021-06-05 DIAGNOSIS — B9561 Methicillin susceptible Staphylococcus aureus infection as the cause of diseases classified elsewhere: Secondary | ICD-10-CM | POA: Diagnosis not present

## 2021-06-05 DIAGNOSIS — E1169 Type 2 diabetes mellitus with other specified complication: Secondary | ICD-10-CM | POA: Diagnosis not present

## 2021-06-05 DIAGNOSIS — E1142 Type 2 diabetes mellitus with diabetic polyneuropathy: Secondary | ICD-10-CM | POA: Diagnosis not present

## 2021-06-05 DIAGNOSIS — R7881 Bacteremia: Secondary | ICD-10-CM | POA: Diagnosis not present

## 2021-06-05 DIAGNOSIS — I150 Renovascular hypertension: Secondary | ICD-10-CM | POA: Diagnosis not present

## 2021-06-05 DIAGNOSIS — N186 End stage renal disease: Secondary | ICD-10-CM | POA: Diagnosis not present

## 2021-06-06 DIAGNOSIS — I120 Hypertensive chronic kidney disease with stage 5 chronic kidney disease or end stage renal disease: Secondary | ICD-10-CM | POA: Diagnosis not present

## 2021-06-06 DIAGNOSIS — D631 Anemia in chronic kidney disease: Secondary | ICD-10-CM | POA: Diagnosis not present

## 2021-06-06 DIAGNOSIS — N186 End stage renal disease: Secondary | ICD-10-CM | POA: Diagnosis not present

## 2021-06-06 DIAGNOSIS — E039 Hypothyroidism, unspecified: Secondary | ICD-10-CM | POA: Diagnosis not present

## 2021-06-06 DIAGNOSIS — Z79899 Other long term (current) drug therapy: Secondary | ICD-10-CM | POA: Diagnosis not present

## 2021-06-06 DIAGNOSIS — E1122 Type 2 diabetes mellitus with diabetic chronic kidney disease: Secondary | ICD-10-CM | POA: Diagnosis not present

## 2021-06-06 DIAGNOSIS — N2581 Secondary hyperparathyroidism of renal origin: Secondary | ICD-10-CM | POA: Diagnosis not present

## 2021-06-06 DIAGNOSIS — Z125 Encounter for screening for malignant neoplasm of prostate: Secondary | ICD-10-CM | POA: Diagnosis not present

## 2021-06-06 DIAGNOSIS — Z992 Dependence on renal dialysis: Secondary | ICD-10-CM | POA: Diagnosis not present

## 2021-06-06 DIAGNOSIS — E559 Vitamin D deficiency, unspecified: Secondary | ICD-10-CM | POA: Diagnosis not present

## 2021-06-06 DIAGNOSIS — I700 Atherosclerosis of aorta: Secondary | ICD-10-CM | POA: Diagnosis not present

## 2021-06-08 DIAGNOSIS — Z992 Dependence on renal dialysis: Secondary | ICD-10-CM | POA: Diagnosis not present

## 2021-06-08 DIAGNOSIS — N2581 Secondary hyperparathyroidism of renal origin: Secondary | ICD-10-CM | POA: Diagnosis not present

## 2021-06-08 DIAGNOSIS — N186 End stage renal disease: Secondary | ICD-10-CM | POA: Diagnosis not present

## 2021-06-11 DIAGNOSIS — Z992 Dependence on renal dialysis: Secondary | ICD-10-CM | POA: Diagnosis not present

## 2021-06-11 DIAGNOSIS — N2581 Secondary hyperparathyroidism of renal origin: Secondary | ICD-10-CM | POA: Diagnosis not present

## 2021-06-11 DIAGNOSIS — N186 End stage renal disease: Secondary | ICD-10-CM | POA: Diagnosis not present

## 2021-06-11 DIAGNOSIS — E1122 Type 2 diabetes mellitus with diabetic chronic kidney disease: Secondary | ICD-10-CM | POA: Diagnosis not present

## 2021-06-27 ENCOUNTER — Other Ambulatory Visit: Payer: Self-pay

## 2021-06-27 DIAGNOSIS — I739 Peripheral vascular disease, unspecified: Secondary | ICD-10-CM

## 2021-07-05 DIAGNOSIS — R7881 Bacteremia: Secondary | ICD-10-CM | POA: Diagnosis not present

## 2021-07-05 DIAGNOSIS — E1122 Type 2 diabetes mellitus with diabetic chronic kidney disease: Secondary | ICD-10-CM | POA: Diagnosis not present

## 2021-07-05 DIAGNOSIS — E1169 Type 2 diabetes mellitus with other specified complication: Secondary | ICD-10-CM | POA: Diagnosis not present

## 2021-07-05 DIAGNOSIS — B9561 Methicillin susceptible Staphylococcus aureus infection as the cause of diseases classified elsewhere: Secondary | ICD-10-CM | POA: Diagnosis not present

## 2021-07-05 DIAGNOSIS — D631 Anemia in chronic kidney disease: Secondary | ICD-10-CM | POA: Diagnosis not present

## 2021-07-05 DIAGNOSIS — M4627 Osteomyelitis of vertebra, lumbosacral region: Secondary | ICD-10-CM | POA: Diagnosis not present

## 2021-07-05 DIAGNOSIS — I150 Renovascular hypertension: Secondary | ICD-10-CM | POA: Diagnosis not present

## 2021-07-05 DIAGNOSIS — E1142 Type 2 diabetes mellitus with diabetic polyneuropathy: Secondary | ICD-10-CM | POA: Diagnosis not present

## 2021-07-05 DIAGNOSIS — N186 End stage renal disease: Secondary | ICD-10-CM | POA: Diagnosis not present

## 2021-07-06 DIAGNOSIS — D631 Anemia in chronic kidney disease: Secondary | ICD-10-CM | POA: Diagnosis not present

## 2021-07-06 DIAGNOSIS — B9561 Methicillin susceptible Staphylococcus aureus infection as the cause of diseases classified elsewhere: Secondary | ICD-10-CM | POA: Diagnosis not present

## 2021-07-06 DIAGNOSIS — I150 Renovascular hypertension: Secondary | ICD-10-CM | POA: Diagnosis not present

## 2021-07-06 DIAGNOSIS — E1142 Type 2 diabetes mellitus with diabetic polyneuropathy: Secondary | ICD-10-CM | POA: Diagnosis not present

## 2021-07-06 DIAGNOSIS — R7881 Bacteremia: Secondary | ICD-10-CM | POA: Diagnosis not present

## 2021-07-06 DIAGNOSIS — E1169 Type 2 diabetes mellitus with other specified complication: Secondary | ICD-10-CM | POA: Diagnosis not present

## 2021-07-06 DIAGNOSIS — N186 End stage renal disease: Secondary | ICD-10-CM | POA: Diagnosis not present

## 2021-07-06 DIAGNOSIS — M4627 Osteomyelitis of vertebra, lumbosacral region: Secondary | ICD-10-CM | POA: Diagnosis not present

## 2021-07-06 DIAGNOSIS — E1122 Type 2 diabetes mellitus with diabetic chronic kidney disease: Secondary | ICD-10-CM | POA: Diagnosis not present

## 2021-07-10 DIAGNOSIS — D631 Anemia in chronic kidney disease: Secondary | ICD-10-CM | POA: Diagnosis not present

## 2021-07-10 DIAGNOSIS — I150 Renovascular hypertension: Secondary | ICD-10-CM | POA: Diagnosis not present

## 2021-07-10 DIAGNOSIS — M4627 Osteomyelitis of vertebra, lumbosacral region: Secondary | ICD-10-CM | POA: Diagnosis not present

## 2021-07-10 DIAGNOSIS — B9561 Methicillin susceptible Staphylococcus aureus infection as the cause of diseases classified elsewhere: Secondary | ICD-10-CM | POA: Diagnosis not present

## 2021-07-10 DIAGNOSIS — E1142 Type 2 diabetes mellitus with diabetic polyneuropathy: Secondary | ICD-10-CM | POA: Diagnosis not present

## 2021-07-10 DIAGNOSIS — E1169 Type 2 diabetes mellitus with other specified complication: Secondary | ICD-10-CM | POA: Diagnosis not present

## 2021-07-10 DIAGNOSIS — N186 End stage renal disease: Secondary | ICD-10-CM | POA: Diagnosis not present

## 2021-07-10 DIAGNOSIS — E1122 Type 2 diabetes mellitus with diabetic chronic kidney disease: Secondary | ICD-10-CM | POA: Diagnosis not present

## 2021-07-10 DIAGNOSIS — R7881 Bacteremia: Secondary | ICD-10-CM | POA: Diagnosis not present

## 2021-07-11 DIAGNOSIS — E1122 Type 2 diabetes mellitus with diabetic chronic kidney disease: Secondary | ICD-10-CM | POA: Diagnosis not present

## 2021-07-11 DIAGNOSIS — M4627 Osteomyelitis of vertebra, lumbosacral region: Secondary | ICD-10-CM | POA: Diagnosis not present

## 2021-07-11 DIAGNOSIS — B9561 Methicillin susceptible Staphylococcus aureus infection as the cause of diseases classified elsewhere: Secondary | ICD-10-CM | POA: Diagnosis not present

## 2021-07-11 DIAGNOSIS — R7881 Bacteremia: Secondary | ICD-10-CM | POA: Diagnosis not present

## 2021-07-11 DIAGNOSIS — I150 Renovascular hypertension: Secondary | ICD-10-CM | POA: Diagnosis not present

## 2021-07-11 DIAGNOSIS — N186 End stage renal disease: Secondary | ICD-10-CM | POA: Diagnosis not present

## 2021-07-11 DIAGNOSIS — D631 Anemia in chronic kidney disease: Secondary | ICD-10-CM | POA: Diagnosis not present

## 2021-07-11 DIAGNOSIS — E1142 Type 2 diabetes mellitus with diabetic polyneuropathy: Secondary | ICD-10-CM | POA: Diagnosis not present

## 2021-07-11 DIAGNOSIS — E1169 Type 2 diabetes mellitus with other specified complication: Secondary | ICD-10-CM | POA: Diagnosis not present

## 2021-07-17 DIAGNOSIS — M4627 Osteomyelitis of vertebra, lumbosacral region: Secondary | ICD-10-CM | POA: Diagnosis not present

## 2021-07-17 DIAGNOSIS — E1142 Type 2 diabetes mellitus with diabetic polyneuropathy: Secondary | ICD-10-CM | POA: Diagnosis not present

## 2021-07-17 DIAGNOSIS — R7881 Bacteremia: Secondary | ICD-10-CM | POA: Diagnosis not present

## 2021-07-17 DIAGNOSIS — E1122 Type 2 diabetes mellitus with diabetic chronic kidney disease: Secondary | ICD-10-CM | POA: Diagnosis not present

## 2021-07-17 DIAGNOSIS — N186 End stage renal disease: Secondary | ICD-10-CM | POA: Diagnosis not present

## 2021-07-17 DIAGNOSIS — E1169 Type 2 diabetes mellitus with other specified complication: Secondary | ICD-10-CM | POA: Diagnosis not present

## 2021-07-17 DIAGNOSIS — D631 Anemia in chronic kidney disease: Secondary | ICD-10-CM | POA: Diagnosis not present

## 2021-07-17 DIAGNOSIS — I150 Renovascular hypertension: Secondary | ICD-10-CM | POA: Diagnosis not present

## 2021-07-17 DIAGNOSIS — B9561 Methicillin susceptible Staphylococcus aureus infection as the cause of diseases classified elsewhere: Secondary | ICD-10-CM | POA: Diagnosis not present

## 2021-07-19 DIAGNOSIS — D631 Anemia in chronic kidney disease: Secondary | ICD-10-CM | POA: Diagnosis not present

## 2021-07-19 DIAGNOSIS — N186 End stage renal disease: Secondary | ICD-10-CM | POA: Diagnosis not present

## 2021-07-19 DIAGNOSIS — R7881 Bacteremia: Secondary | ICD-10-CM | POA: Diagnosis not present

## 2021-07-19 DIAGNOSIS — E1142 Type 2 diabetes mellitus with diabetic polyneuropathy: Secondary | ICD-10-CM | POA: Diagnosis not present

## 2021-07-19 DIAGNOSIS — E1122 Type 2 diabetes mellitus with diabetic chronic kidney disease: Secondary | ICD-10-CM | POA: Diagnosis not present

## 2021-07-19 DIAGNOSIS — B9561 Methicillin susceptible Staphylococcus aureus infection as the cause of diseases classified elsewhere: Secondary | ICD-10-CM | POA: Diagnosis not present

## 2021-07-19 DIAGNOSIS — I150 Renovascular hypertension: Secondary | ICD-10-CM | POA: Diagnosis not present

## 2021-07-19 DIAGNOSIS — E1169 Type 2 diabetes mellitus with other specified complication: Secondary | ICD-10-CM | POA: Diagnosis not present

## 2021-07-19 DIAGNOSIS — M4627 Osteomyelitis of vertebra, lumbosacral region: Secondary | ICD-10-CM | POA: Diagnosis not present

## 2021-07-20 DIAGNOSIS — E1142 Type 2 diabetes mellitus with diabetic polyneuropathy: Secondary | ICD-10-CM | POA: Diagnosis not present

## 2021-07-20 DIAGNOSIS — D631 Anemia in chronic kidney disease: Secondary | ICD-10-CM | POA: Diagnosis not present

## 2021-07-20 DIAGNOSIS — R7881 Bacteremia: Secondary | ICD-10-CM | POA: Diagnosis not present

## 2021-07-20 DIAGNOSIS — E1169 Type 2 diabetes mellitus with other specified complication: Secondary | ICD-10-CM | POA: Diagnosis not present

## 2021-07-20 DIAGNOSIS — N186 End stage renal disease: Secondary | ICD-10-CM | POA: Diagnosis not present

## 2021-07-20 DIAGNOSIS — B9561 Methicillin susceptible Staphylococcus aureus infection as the cause of diseases classified elsewhere: Secondary | ICD-10-CM | POA: Diagnosis not present

## 2021-07-20 DIAGNOSIS — M4627 Osteomyelitis of vertebra, lumbosacral region: Secondary | ICD-10-CM | POA: Diagnosis not present

## 2021-07-20 DIAGNOSIS — I150 Renovascular hypertension: Secondary | ICD-10-CM | POA: Diagnosis not present

## 2021-07-20 DIAGNOSIS — E1122 Type 2 diabetes mellitus with diabetic chronic kidney disease: Secondary | ICD-10-CM | POA: Diagnosis not present

## 2021-07-23 DIAGNOSIS — E1142 Type 2 diabetes mellitus with diabetic polyneuropathy: Secondary | ICD-10-CM | POA: Diagnosis not present

## 2021-07-23 DIAGNOSIS — R7881 Bacteremia: Secondary | ICD-10-CM | POA: Diagnosis not present

## 2021-07-23 DIAGNOSIS — B9561 Methicillin susceptible Staphylococcus aureus infection as the cause of diseases classified elsewhere: Secondary | ICD-10-CM | POA: Diagnosis not present

## 2021-07-23 DIAGNOSIS — N186 End stage renal disease: Secondary | ICD-10-CM | POA: Diagnosis not present

## 2021-07-23 DIAGNOSIS — D631 Anemia in chronic kidney disease: Secondary | ICD-10-CM | POA: Diagnosis not present

## 2021-07-23 DIAGNOSIS — E1169 Type 2 diabetes mellitus with other specified complication: Secondary | ICD-10-CM | POA: Diagnosis not present

## 2021-07-23 DIAGNOSIS — I150 Renovascular hypertension: Secondary | ICD-10-CM | POA: Diagnosis not present

## 2021-07-23 DIAGNOSIS — M4627 Osteomyelitis of vertebra, lumbosacral region: Secondary | ICD-10-CM | POA: Diagnosis not present

## 2021-07-23 DIAGNOSIS — E1122 Type 2 diabetes mellitus with diabetic chronic kidney disease: Secondary | ICD-10-CM | POA: Diagnosis not present

## 2021-07-23 NOTE — Progress Notes (Signed)
[facility] [place]  ASSESSMENT / PLAN: Ryan Wells is a 65 y.o. male with bilateral lower extremity discomfort.  He he has no palpable pedal pulses.  His ankle-brachial indices are noncompressible.  Thankfully his toe pressures are reassuring and suggest good perfusion to the feet bilaterally.  I suspect his symptoms are from radicular pain.  We will refer him to orthopedics for further evaluation.  CHIEF COMPLAINT: Leg pain  HISTORY OF PRESENT ILLNESS: Ryan Wells is a 65 y.o. male well-known to our practice for dialysis access.  The patient reports electric discomfort radiating down his left leg.  He also reports episodic pain at the base of his right foot.  The pain does not appear related to ambulating.  He does not not have symptoms typical of ischemic rest pain.  He does not have any ulcers about his feet.  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  Did not get outpatient stress testing following hospitalization.  No chest pain since   Chronic kidney disease    on dialysis Tues, Thurs and Sat   Diabetes mellitus without complication ([place])    Diabetes type 2, uncontrolled ([place]) 11/25/2011   no meds, diet controlled per patient   Diabetic gastroparesis ([place]) 01/22/2017   Hypertension    no meds   Microalbuminuria 01/19/2017   Myocardial infarction [facility]) 2015    Past Surgical History:  Procedure Laterality Date   A/V FISTULAGRAM Left 06/04/2019   Procedure: A/V FISTULAGRAM;  Surgeon: Angelia Mould, MD;  Location: [place] CV [facility];  Service: Cardiovascular;  Laterality: Left;   A/V FISTULAGRAM Left 10/15/2019   Procedure: A/V FISTULAGRAM;  Surgeon: Angelia Mould, MD;  Location: [place] CV [facility];  Service: Cardiovascular;  Laterality: Left;   A/V  FISTULAGRAM N/A 01/24/2021   Procedure: A/V FISTULAGRAM - Left Upper;  Surgeon: Cherre Robins, MD;  Location: [place] CV LAB;  Service: Cardiovascular;  Laterality: N/A;   AV FISTULA PLACEMENT Left 07/02/2018   Procedure: BRACHIOCEPHALIC ARTERIOVENOUS (AV) FISTULA CREATION LEFT ARM;  Surgeon: Serafina Mitchell, MD;  Location: [place];  Service: Vascular;  Laterality: Left;   [place] Left 07/26/2019   Procedure: Bascilic Vein Transposition;  Surgeon: Angelia Mould, MD;  Location: [place];  Service: Vascular;  Laterality: Left;   COLONOSCOPY     EMBOLIZATION Left 06/04/2019   Procedure: EMBOLIZATION;  Surgeon: Angelia Mould, MD;  Location: [place] CV LAB;  Service: Cardiovascular;  Laterality: Left;  LT ARM FISTULA/COMPETING BRANCH   EYE SURGERY Bilateral    cataracts x2   EYE SURGERY     INSERTION OF DIALYSIS CATHETER Right 06/08/2019   Procedure: INSERTION OF PALINDROME DIALYSIS CATHETER IN RIGHT INTERNAL JUGULAR;  Surgeon: Angelia Mould, MD;  Location: [place];  Service: Vascular;  Laterality: Right;   LIGATION OF ARTERIOVENOUS  FISTULA Left 07/26/2019   Procedure: Ligation Of BrachioCephalic  Fistula;  Surgeon: Angelia Mould, MD;  Location: [place];  Service: Vascular;  Laterality: Left;   NECK SURGERY     PERIPHERAL VASCULAR BALLOON ANGIOPLASTY Left 06/04/2019   Procedure: PERIPHERAL VASCULAR BALLOON ANGIOPLASTY;  Surgeon: Angelia Mould, MD;  Location: [place] [facility];  Service: Cardiovascular;  Laterality: Left;  ARM FISTULA   PERIPHERAL VASCULAR BALLOON  ANGIOPLASTY Left 10/15/2019   Procedure: PERIPHERAL VASCULAR BALLOON ANGIOPLASTY;  Surgeon: Angelia Mould, MD;  Location: [place] CV [facility];  Service: Cardiovascular;  Laterality: Left;  arm fistula   TEE WITHOUT CARDIOVERSION N/A 01/19/2021   Procedure: TRANSESOPHAGEAL ECHOCARDIOGRAM (TEE);  Surgeon: Jerline Pain, MD;  Location: [facility] ENDOSCOPY;  Service:  Cardiovascular;  Laterality: N/A;    Family History  Problem Relation Age of Onset   Heart disease Mother        cause of death--CHF?   Diabetes Father    Kidney disease Father        Dialysis for kidney failure   Diabetes Sister    Diabetes Brother    Colon cancer Neg Hx    Stomach cancer Neg Hx    Esophageal cancer Neg Hx     Social History   Socioeconomic History   Marital status: Married    Spouse name: Mariana Arn   Number of children: 3   Years of education: 1 year university   Highest education level: Not on file  Occupational History   Occupation: unemployed  Tobacco Use   Smoking status: Former   Smokeless tobacco: Never  Scientific laboratory technician Use: Never used  Substance and Sexual Activity   Alcohol use: Not Currently   Drug use: Never   Sexual activity: Yes  Other Topics Concern   Not on file  Social History Narrative   ** Merged History Encounter **       Originally from [place] Came to [place]. In 1985 Lives in [place] neighborhood with wife and 3 sons.   Sons are 56-30 yo Some sons help with support   Social Determinants of Radio broadcast assistant Strain: Not on file  Food Insecurity: Not on file  Transportation Needs: Not on file  Physical Activity: Not on file  Stress: Not on file  Social Connections: Not on file  Intimate Partner Violence: Not on file    No Known Allergies  Current Outpatient Medications  Medication Sig Dispense Refill   acetaminophen (TYLENOL) 325 MG tablet Take 2 tablets (650 mg total) by mouth every 6 (six) hours as needed for mild pain (or Fever >/= 101).     cyclobenzaprine (FLEXERIL) 5 MG tablet Take 1 tablet (5 mg total) by mouth 3 (three) times daily. 30 tablet 0   Darbepoetin Alfa (ARANESP) 60 MCG/0.3ML SOSY injection Inject 0.3 mLs (60 mcg total) into the vein every Friday with hemodialysis. 4.2 mL    diclofenac (FLECTOR) 1.3 % PTCH Place 1 patch onto the skin 2 (two) times daily. 60 patch     doxercalciferol (HECTOROL) 4 MCG/2ML injection Inject 2 mLs (4 mcg total) into the vein every Monday, Wednesday, and Friday with hemodialysis. 2 mL    fentaNYL (DURAGESIC) 50 MCG/HR Place 1 patch onto the skin every 3 (three) days. 5 patch 0   gabapentin (NEURONTIN) 100 MG capsule Take 1 capsule (100 mg total) by mouth 3 (three) times daily.     lidocaine (LIDODERM) 5 % Place 1 patch onto the skin daily. Remove & Discard patch within 12 hours or as directed by MD 30 patch 0   midodrine (PROAMATINE) 10 MG tablet Take 1 tablet (10 mg total) by mouth 3 (three) times daily with meals. 30 tablet 0   pentafluoroprop-tetrafluoroeth (GEBAUERS) AERO Apply 1 application topically as needed (topical anesthesia for hemodialysis).  0   senna-docusate (SENOKOT-S) 8.6-50 MG tablet Take 2 tablets by mouth 2 (  two) times daily.     VELPHORO 500 MG chewable tablet Chew 1,000 mg by mouth 3 (three) times daily with meals.     No current facility-administered medications for this visit.    REVIEW OF SYSTEMS:  [X]  denotes positive finding, [ ]  denotes negative finding Cardiac  Comments:  Chest pain or chest pressure:    Shortness of breath upon exertion:    Short of breath when lying flat:    Irregular heart rhythm:        Vascular    Pain in calf, thigh, or hip brought on by ambulation:    Pain in feet at night that wakes you up from your sleep:     Blood clot in your veins:    Leg swelling:         Pulmonary    Oxygen at home:    Productive cough:     Wheezing:         Neurologic    Sudden weakness in arms or legs:     Sudden numbness in arms or legs:     Sudden onset of difficulty speaking or slurred speech:    Temporary loss of vision in one eye:     Problems with dizziness:         Gastrointestinal    Blood in stool:     Vomited blood:         Genitourinary    Burning when urinating:     Blood in urine:        Psychiatric    Major depression:         Hematologic    Bleeding problems:     Problems with blood clotting too easily:        Skin    Rashes or ulcers:        Constitutional    Fever or chills:      PHYSICAL EXAM Vitals:   07/24/21 1301  BP: (!) 149/66  Pulse: 74  Resp: 20  Temp: 98.6 F (37 C)  SpO2: 98%  Weight: 131 lb (59.4 kg)  Height: 5\' 2"  (1.575 m)    Constitutional: well appearing. no distress. Appears well nourished.  Neurologic: CN intact. no focal findings. no sensory loss. Psychiatric:  Mood and affect symmetric and appropriate. Eyes:  No icterus. No conjunctival pallor. Ears, nose, throat:  mucous membranes moist. Midline trachea.  Cardiac: regular rate and rhythm.  Respiratory:  unlabored. Abdominal:  soft, non-tender, non-distended.  Peripheral vascular: left arm AVF with thrill. No palpable pedal pulses. Warm feet with good capillary refill. Extremity: no edema. no cyanosis. no pallor.  Skin: no gangrene. no ulceration.  Lymphatic: no Stemmer's sign. no palpable lymphadenopathy.  PERTINENT LABORATORY AND RADIOLOGIC DATA  Most recent CBC CBC Latest Ref Rng & Units 04/02/2021 04/01/2021 03/30/2021  WBC 4.0 - 10.5 K/uL 6.9 6.1 9.0  Hemoglobin 13.0 - 17.0 g/dL 8.7(L) 9.1(L) 9.0(L)  Hematocrit 39.0 - 52.0 % 29.4(L) 31.3(L) 29.8(L)  Platelets 150 - 400 K/uL 390 382 361     Most recent CMP CMP Latest Ref Rng & Units 04/07/2021 04/06/2021 04/05/2021  Glucose 70 - 99 mg/dL 86 116(H) 102(H)  BUN 8 - 23 mg/dL 24(H) 39(H) 23  Creatinine 0.61 - 1.24 mg/dL 4.59(H) 6.56(H) 4.72(H)  Sodium 135 - 145 mmol/L 131(L) 132(L) 132(L)  Potassium 3.5 - 5.1 mmol/L 4.3 4.6 4.0  Chloride 98 - 111 mmol/L 94(L) 90(L) 94(L)  CO2 22 - 32 mmol/L 27 28 30   Calcium  8.9 - 10.3 mg/dL 9.7 10.4(H) 9.9  Total Protein 6.5 - 8.1 g/dL - - -  Total Bilirubin 0.3 - 1.2 mg/dL - - -  Alkaline Phos 38 - 126 U/L - - -  AST 15 - 41 U/L - - -  ALT 0 - 44 U/L - - -    Renal function CrCl cannot be calculated (Patient's most recent lab result is older than the maximum  21 days allowed.).  Hgb A1c MFr Bld (%)  Date Value  01/16/2021 5.1    LDL Calculated  Date Value Ref Range Status  07/12/2016 158 (H) 0 - 99 mg/dL Final     +-------+-----------+-----------+------------+------------+  ABI/TBIToday's ABIToday's TBIPrevious ABIPrevious TBI  +-------+-----------+-----------+------------+------------+  Right  [place]         0.80                                 +-------+-----------+-----------+------------+------------+  Left   [place]         0.87                                 +-------+-----------+-----------+------------+------------+   Yevonne Aline. Stanford Breed, MD [facility] Phone Number: [phone] 07/23/2021 6:55 PM  Total time spent on preparing this encounter including chart review, data review, collecting history, examining the patient, coordinating care for this new patient, 45 minutes.  Portions of this report may have been transcribed using voice recognition software.  Every effort has been made to ensure accuracy; however, inadvertent computerized transcription errors may still be present.

## 2021-07-24 ENCOUNTER — Other Ambulatory Visit: Payer: Self-pay

## 2021-07-24 ENCOUNTER — Ambulatory Visit (INDEPENDENT_AMBULATORY_CARE_PROVIDER_SITE_OTHER): Payer: Medicare HMO | Admitting: Vascular Surgery

## 2021-07-24 ENCOUNTER — Ambulatory Visit (HOSPITAL_COMMUNITY)
Admission: RE | Admit: 2021-07-24 | Discharge: 2021-07-24 | Disposition: A | Discharge status: Home / Self Care | Payer: Medicare HMO | Source: Ambulatory Visit | Attending: Vascular Surgery | Admitting: Vascular Surgery

## 2021-07-24 ENCOUNTER — Encounter: Payer: Self-pay | Admitting: Vascular Surgery

## 2021-07-24 VITALS — BP 149/66 | HR 74 | Temp 98.6°F | Resp 20 | Ht 62.0 in | Wt 131.0 lb

## 2021-07-24 DIAGNOSIS — I739 Peripheral vascular disease, unspecified: Secondary | ICD-10-CM

## 2021-07-26 DIAGNOSIS — E1122 Type 2 diabetes mellitus with diabetic chronic kidney disease: Secondary | ICD-10-CM | POA: Diagnosis not present

## 2021-07-26 DIAGNOSIS — N186 End stage renal disease: Secondary | ICD-10-CM | POA: Diagnosis not present

## 2021-07-26 DIAGNOSIS — M4627 Osteomyelitis of vertebra, lumbosacral region: Secondary | ICD-10-CM | POA: Diagnosis not present

## 2021-07-26 DIAGNOSIS — D631 Anemia in chronic kidney disease: Secondary | ICD-10-CM | POA: Diagnosis not present

## 2021-07-26 DIAGNOSIS — R7881 Bacteremia: Secondary | ICD-10-CM | POA: Diagnosis not present

## 2021-07-26 DIAGNOSIS — B9561 Methicillin susceptible Staphylococcus aureus infection as the cause of diseases classified elsewhere: Secondary | ICD-10-CM | POA: Diagnosis not present

## 2021-07-26 DIAGNOSIS — E1142 Type 2 diabetes mellitus with diabetic polyneuropathy: Secondary | ICD-10-CM | POA: Diagnosis not present

## 2021-07-26 DIAGNOSIS — I150 Renovascular hypertension: Secondary | ICD-10-CM | POA: Diagnosis not present

## 2021-07-26 DIAGNOSIS — E1169 Type 2 diabetes mellitus with other specified complication: Secondary | ICD-10-CM | POA: Diagnosis not present

## 2021-07-31 DIAGNOSIS — M4627 Osteomyelitis of vertebra, lumbosacral region: Secondary | ICD-10-CM | POA: Diagnosis not present

## 2021-07-31 DIAGNOSIS — D631 Anemia in chronic kidney disease: Secondary | ICD-10-CM | POA: Diagnosis not present

## 2021-07-31 DIAGNOSIS — N186 End stage renal disease: Secondary | ICD-10-CM | POA: Diagnosis not present

## 2021-07-31 DIAGNOSIS — E1142 Type 2 diabetes mellitus with diabetic polyneuropathy: Secondary | ICD-10-CM | POA: Diagnosis not present

## 2021-07-31 DIAGNOSIS — E1169 Type 2 diabetes mellitus with other specified complication: Secondary | ICD-10-CM | POA: Diagnosis not present

## 2021-07-31 DIAGNOSIS — E1122 Type 2 diabetes mellitus with diabetic chronic kidney disease: Secondary | ICD-10-CM | POA: Diagnosis not present

## 2021-07-31 DIAGNOSIS — R7881 Bacteremia: Secondary | ICD-10-CM | POA: Diagnosis not present

## 2021-07-31 DIAGNOSIS — B9561 Methicillin susceptible Staphylococcus aureus infection as the cause of diseases classified elsewhere: Secondary | ICD-10-CM | POA: Diagnosis not present

## 2021-07-31 DIAGNOSIS — I150 Renovascular hypertension: Secondary | ICD-10-CM | POA: Diagnosis not present

## 2021-08-02 DIAGNOSIS — D631 Anemia in chronic kidney disease: Secondary | ICD-10-CM | POA: Diagnosis not present

## 2021-08-02 DIAGNOSIS — M4627 Osteomyelitis of vertebra, lumbosacral region: Secondary | ICD-10-CM | POA: Diagnosis not present

## 2021-08-02 DIAGNOSIS — E1169 Type 2 diabetes mellitus with other specified complication: Secondary | ICD-10-CM | POA: Diagnosis not present

## 2021-08-02 DIAGNOSIS — E1122 Type 2 diabetes mellitus with diabetic chronic kidney disease: Secondary | ICD-10-CM | POA: Diagnosis not present

## 2021-08-02 DIAGNOSIS — R7881 Bacteremia: Secondary | ICD-10-CM | POA: Diagnosis not present

## 2021-08-02 DIAGNOSIS — E1142 Type 2 diabetes mellitus with diabetic polyneuropathy: Secondary | ICD-10-CM | POA: Diagnosis not present

## 2021-08-02 DIAGNOSIS — N186 End stage renal disease: Secondary | ICD-10-CM | POA: Diagnosis not present

## 2021-08-02 DIAGNOSIS — I150 Renovascular hypertension: Secondary | ICD-10-CM | POA: Diagnosis not present

## 2021-08-02 DIAGNOSIS — B9561 Methicillin susceptible Staphylococcus aureus infection as the cause of diseases classified elsewhere: Secondary | ICD-10-CM | POA: Diagnosis not present

## 2021-08-03 DIAGNOSIS — M4627 Osteomyelitis of vertebra, lumbosacral region: Secondary | ICD-10-CM | POA: Diagnosis not present

## 2021-08-03 DIAGNOSIS — R7881 Bacteremia: Secondary | ICD-10-CM | POA: Diagnosis not present

## 2021-08-03 DIAGNOSIS — N186 End stage renal disease: Secondary | ICD-10-CM | POA: Diagnosis not present

## 2021-08-03 DIAGNOSIS — E1169 Type 2 diabetes mellitus with other specified complication: Secondary | ICD-10-CM | POA: Diagnosis not present

## 2021-08-03 DIAGNOSIS — B9561 Methicillin susceptible Staphylococcus aureus infection as the cause of diseases classified elsewhere: Secondary | ICD-10-CM | POA: Diagnosis not present

## 2021-08-03 DIAGNOSIS — D631 Anemia in chronic kidney disease: Secondary | ICD-10-CM | POA: Diagnosis not present

## 2021-08-03 DIAGNOSIS — E1142 Type 2 diabetes mellitus with diabetic polyneuropathy: Secondary | ICD-10-CM | POA: Diagnosis not present

## 2021-08-03 DIAGNOSIS — E1122 Type 2 diabetes mellitus with diabetic chronic kidney disease: Secondary | ICD-10-CM | POA: Diagnosis not present

## 2021-08-03 DIAGNOSIS — I150 Renovascular hypertension: Secondary | ICD-10-CM | POA: Diagnosis not present

## 2021-08-04 DIAGNOSIS — I150 Renovascular hypertension: Secondary | ICD-10-CM | POA: Diagnosis not present

## 2021-08-04 DIAGNOSIS — R7881 Bacteremia: Secondary | ICD-10-CM | POA: Diagnosis not present

## 2021-08-04 DIAGNOSIS — N186 End stage renal disease: Secondary | ICD-10-CM | POA: Diagnosis not present

## 2021-08-04 DIAGNOSIS — E1142 Type 2 diabetes mellitus with diabetic polyneuropathy: Secondary | ICD-10-CM | POA: Diagnosis not present

## 2021-08-04 DIAGNOSIS — E1122 Type 2 diabetes mellitus with diabetic chronic kidney disease: Secondary | ICD-10-CM | POA: Diagnosis not present

## 2021-08-04 DIAGNOSIS — M4627 Osteomyelitis of vertebra, lumbosacral region: Secondary | ICD-10-CM | POA: Diagnosis not present

## 2021-08-04 DIAGNOSIS — D631 Anemia in chronic kidney disease: Secondary | ICD-10-CM | POA: Diagnosis not present

## 2021-08-04 DIAGNOSIS — B9561 Methicillin susceptible Staphylococcus aureus infection as the cause of diseases classified elsewhere: Secondary | ICD-10-CM | POA: Diagnosis not present

## 2021-08-04 DIAGNOSIS — E1169 Type 2 diabetes mellitus with other specified complication: Secondary | ICD-10-CM | POA: Diagnosis not present

## 2021-08-16 DIAGNOSIS — N186 End stage renal disease: Secondary | ICD-10-CM | POA: Diagnosis not present

## 2021-08-16 DIAGNOSIS — D631 Anemia in chronic kidney disease: Secondary | ICD-10-CM | POA: Diagnosis not present

## 2021-08-16 DIAGNOSIS — E1122 Type 2 diabetes mellitus with diabetic chronic kidney disease: Secondary | ICD-10-CM | POA: Diagnosis not present

## 2021-08-16 DIAGNOSIS — E1142 Type 2 diabetes mellitus with diabetic polyneuropathy: Secondary | ICD-10-CM | POA: Diagnosis not present

## 2021-08-16 DIAGNOSIS — R7881 Bacteremia: Secondary | ICD-10-CM | POA: Diagnosis not present

## 2021-08-16 DIAGNOSIS — I150 Renovascular hypertension: Secondary | ICD-10-CM | POA: Diagnosis not present

## 2021-08-16 DIAGNOSIS — B9561 Methicillin susceptible Staphylococcus aureus infection as the cause of diseases classified elsewhere: Secondary | ICD-10-CM | POA: Diagnosis not present

## 2021-08-16 DIAGNOSIS — E1169 Type 2 diabetes mellitus with other specified complication: Secondary | ICD-10-CM | POA: Diagnosis not present

## 2021-08-16 DIAGNOSIS — M4627 Osteomyelitis of vertebra, lumbosacral region: Secondary | ICD-10-CM | POA: Diagnosis not present

## 2021-08-17 DIAGNOSIS — M4627 Osteomyelitis of vertebra, lumbosacral region: Secondary | ICD-10-CM | POA: Diagnosis not present

## 2021-08-17 DIAGNOSIS — B9561 Methicillin susceptible Staphylococcus aureus infection as the cause of diseases classified elsewhere: Secondary | ICD-10-CM | POA: Diagnosis not present

## 2021-08-17 DIAGNOSIS — N186 End stage renal disease: Secondary | ICD-10-CM | POA: Diagnosis not present

## 2021-08-17 DIAGNOSIS — E1169 Type 2 diabetes mellitus with other specified complication: Secondary | ICD-10-CM | POA: Diagnosis not present

## 2021-08-17 DIAGNOSIS — D631 Anemia in chronic kidney disease: Secondary | ICD-10-CM | POA: Diagnosis not present

## 2021-08-17 DIAGNOSIS — R7881 Bacteremia: Secondary | ICD-10-CM | POA: Diagnosis not present

## 2021-08-17 DIAGNOSIS — E1142 Type 2 diabetes mellitus with diabetic polyneuropathy: Secondary | ICD-10-CM | POA: Diagnosis not present

## 2021-08-17 DIAGNOSIS — I150 Renovascular hypertension: Secondary | ICD-10-CM | POA: Diagnosis not present

## 2021-08-17 DIAGNOSIS — E1122 Type 2 diabetes mellitus with diabetic chronic kidney disease: Secondary | ICD-10-CM | POA: Diagnosis not present

## 2021-08-23 DIAGNOSIS — R7881 Bacteremia: Secondary | ICD-10-CM | POA: Diagnosis not present

## 2021-08-23 DIAGNOSIS — I150 Renovascular hypertension: Secondary | ICD-10-CM | POA: Diagnosis not present

## 2021-08-23 DIAGNOSIS — E1169 Type 2 diabetes mellitus with other specified complication: Secondary | ICD-10-CM | POA: Diagnosis not present

## 2021-08-23 DIAGNOSIS — E1122 Type 2 diabetes mellitus with diabetic chronic kidney disease: Secondary | ICD-10-CM | POA: Diagnosis not present

## 2021-08-23 DIAGNOSIS — E1142 Type 2 diabetes mellitus with diabetic polyneuropathy: Secondary | ICD-10-CM | POA: Diagnosis not present

## 2021-08-23 DIAGNOSIS — M4627 Osteomyelitis of vertebra, lumbosacral region: Secondary | ICD-10-CM | POA: Diagnosis not present

## 2021-08-23 DIAGNOSIS — D631 Anemia in chronic kidney disease: Secondary | ICD-10-CM | POA: Diagnosis not present

## 2021-08-23 DIAGNOSIS — N186 End stage renal disease: Secondary | ICD-10-CM | POA: Diagnosis not present

## 2021-08-23 DIAGNOSIS — B9561 Methicillin susceptible Staphylococcus aureus infection as the cause of diseases classified elsewhere: Secondary | ICD-10-CM | POA: Diagnosis not present

## 2021-08-24 DIAGNOSIS — D631 Anemia in chronic kidney disease: Secondary | ICD-10-CM | POA: Diagnosis not present

## 2021-08-24 DIAGNOSIS — N186 End stage renal disease: Secondary | ICD-10-CM | POA: Diagnosis not present

## 2021-08-24 DIAGNOSIS — E1122 Type 2 diabetes mellitus with diabetic chronic kidney disease: Secondary | ICD-10-CM | POA: Diagnosis not present

## 2021-08-24 DIAGNOSIS — R7881 Bacteremia: Secondary | ICD-10-CM | POA: Diagnosis not present

## 2021-08-24 DIAGNOSIS — E1169 Type 2 diabetes mellitus with other specified complication: Secondary | ICD-10-CM | POA: Diagnosis not present

## 2021-08-24 DIAGNOSIS — I150 Renovascular hypertension: Secondary | ICD-10-CM | POA: Diagnosis not present

## 2021-08-24 DIAGNOSIS — B9561 Methicillin susceptible Staphylococcus aureus infection as the cause of diseases classified elsewhere: Secondary | ICD-10-CM | POA: Diagnosis not present

## 2021-08-24 DIAGNOSIS — M4627 Osteomyelitis of vertebra, lumbosacral region: Secondary | ICD-10-CM | POA: Diagnosis not present

## 2021-08-24 DIAGNOSIS — E1142 Type 2 diabetes mellitus with diabetic polyneuropathy: Secondary | ICD-10-CM | POA: Diagnosis not present

## 2021-08-28 DIAGNOSIS — R7881 Bacteremia: Secondary | ICD-10-CM | POA: Diagnosis not present

## 2021-08-28 DIAGNOSIS — E1169 Type 2 diabetes mellitus with other specified complication: Secondary | ICD-10-CM | POA: Diagnosis not present

## 2021-08-28 DIAGNOSIS — B9561 Methicillin susceptible Staphylococcus aureus infection as the cause of diseases classified elsewhere: Secondary | ICD-10-CM | POA: Diagnosis not present

## 2021-08-28 DIAGNOSIS — M4627 Osteomyelitis of vertebra, lumbosacral region: Secondary | ICD-10-CM | POA: Diagnosis not present

## 2021-08-28 DIAGNOSIS — E1142 Type 2 diabetes mellitus with diabetic polyneuropathy: Secondary | ICD-10-CM | POA: Diagnosis not present

## 2021-08-28 DIAGNOSIS — E1122 Type 2 diabetes mellitus with diabetic chronic kidney disease: Secondary | ICD-10-CM | POA: Diagnosis not present

## 2021-08-28 DIAGNOSIS — D631 Anemia in chronic kidney disease: Secondary | ICD-10-CM | POA: Diagnosis not present

## 2021-08-28 DIAGNOSIS — I150 Renovascular hypertension: Secondary | ICD-10-CM | POA: Diagnosis not present

## 2021-08-28 DIAGNOSIS — N186 End stage renal disease: Secondary | ICD-10-CM | POA: Diagnosis not present

## 2021-09-03 DIAGNOSIS — E1142 Type 2 diabetes mellitus with diabetic polyneuropathy: Secondary | ICD-10-CM | POA: Diagnosis not present

## 2021-09-03 DIAGNOSIS — B9561 Methicillin susceptible Staphylococcus aureus infection as the cause of diseases classified elsewhere: Secondary | ICD-10-CM | POA: Diagnosis not present

## 2021-09-03 DIAGNOSIS — I150 Renovascular hypertension: Secondary | ICD-10-CM | POA: Diagnosis not present

## 2021-09-03 DIAGNOSIS — E1122 Type 2 diabetes mellitus with diabetic chronic kidney disease: Secondary | ICD-10-CM | POA: Diagnosis not present

## 2021-09-03 DIAGNOSIS — D631 Anemia in chronic kidney disease: Secondary | ICD-10-CM | POA: Diagnosis not present

## 2021-09-03 DIAGNOSIS — M4627 Osteomyelitis of vertebra, lumbosacral region: Secondary | ICD-10-CM | POA: Diagnosis not present

## 2021-09-03 DIAGNOSIS — R7881 Bacteremia: Secondary | ICD-10-CM | POA: Diagnosis not present

## 2021-09-03 DIAGNOSIS — E1169 Type 2 diabetes mellitus with other specified complication: Secondary | ICD-10-CM | POA: Diagnosis not present

## 2021-09-03 DIAGNOSIS — N186 End stage renal disease: Secondary | ICD-10-CM | POA: Diagnosis not present

## 2021-09-11 DIAGNOSIS — I150 Renovascular hypertension: Secondary | ICD-10-CM | POA: Diagnosis not present

## 2021-09-11 DIAGNOSIS — B9561 Methicillin susceptible Staphylococcus aureus infection as the cause of diseases classified elsewhere: Secondary | ICD-10-CM | POA: Diagnosis not present

## 2021-09-11 DIAGNOSIS — D631 Anemia in chronic kidney disease: Secondary | ICD-10-CM | POA: Diagnosis not present

## 2021-09-11 DIAGNOSIS — R7881 Bacteremia: Secondary | ICD-10-CM | POA: Diagnosis not present

## 2021-09-11 DIAGNOSIS — M4627 Osteomyelitis of vertebra, lumbosacral region: Secondary | ICD-10-CM | POA: Diagnosis not present

## 2021-09-11 DIAGNOSIS — E1169 Type 2 diabetes mellitus with other specified complication: Secondary | ICD-10-CM | POA: Diagnosis not present

## 2021-09-11 DIAGNOSIS — N186 End stage renal disease: Secondary | ICD-10-CM | POA: Diagnosis not present

## 2021-09-11 DIAGNOSIS — E1122 Type 2 diabetes mellitus with diabetic chronic kidney disease: Secondary | ICD-10-CM | POA: Diagnosis not present

## 2021-09-11 DIAGNOSIS — E1142 Type 2 diabetes mellitus with diabetic polyneuropathy: Secondary | ICD-10-CM | POA: Diagnosis not present

## 2021-09-13 DIAGNOSIS — N186 End stage renal disease: Secondary | ICD-10-CM | POA: Diagnosis not present

## 2021-09-13 DIAGNOSIS — R7881 Bacteremia: Secondary | ICD-10-CM | POA: Diagnosis not present

## 2021-09-13 DIAGNOSIS — B9561 Methicillin susceptible Staphylococcus aureus infection as the cause of diseases classified elsewhere: Secondary | ICD-10-CM | POA: Diagnosis not present

## 2021-09-13 DIAGNOSIS — E1122 Type 2 diabetes mellitus with diabetic chronic kidney disease: Secondary | ICD-10-CM | POA: Diagnosis not present

## 2021-09-13 DIAGNOSIS — D631 Anemia in chronic kidney disease: Secondary | ICD-10-CM | POA: Diagnosis not present

## 2021-09-13 DIAGNOSIS — E1142 Type 2 diabetes mellitus with diabetic polyneuropathy: Secondary | ICD-10-CM | POA: Diagnosis not present

## 2021-09-13 DIAGNOSIS — I150 Renovascular hypertension: Secondary | ICD-10-CM | POA: Diagnosis not present

## 2021-09-13 DIAGNOSIS — M4627 Osteomyelitis of vertebra, lumbosacral region: Secondary | ICD-10-CM | POA: Diagnosis not present

## 2021-09-13 DIAGNOSIS — E1169 Type 2 diabetes mellitus with other specified complication: Secondary | ICD-10-CM | POA: Diagnosis not present

## 2021-09-28 DIAGNOSIS — E1169 Type 2 diabetes mellitus with other specified complication: Secondary | ICD-10-CM | POA: Diagnosis not present

## 2021-09-28 DIAGNOSIS — N186 End stage renal disease: Secondary | ICD-10-CM | POA: Diagnosis not present

## 2021-09-28 DIAGNOSIS — D631 Anemia in chronic kidney disease: Secondary | ICD-10-CM | POA: Diagnosis not present

## 2021-09-28 DIAGNOSIS — B9561 Methicillin susceptible Staphylococcus aureus infection as the cause of diseases classified elsewhere: Secondary | ICD-10-CM | POA: Diagnosis not present

## 2021-09-28 DIAGNOSIS — E1122 Type 2 diabetes mellitus with diabetic chronic kidney disease: Secondary | ICD-10-CM | POA: Diagnosis not present

## 2021-09-28 DIAGNOSIS — I150 Renovascular hypertension: Secondary | ICD-10-CM | POA: Diagnosis not present

## 2021-09-28 DIAGNOSIS — E1142 Type 2 diabetes mellitus with diabetic polyneuropathy: Secondary | ICD-10-CM | POA: Diagnosis not present

## 2021-09-28 DIAGNOSIS — R7881 Bacteremia: Secondary | ICD-10-CM | POA: Diagnosis not present

## 2021-09-28 DIAGNOSIS — M4627 Osteomyelitis of vertebra, lumbosacral region: Secondary | ICD-10-CM | POA: Diagnosis not present

## 2021-10-01 DIAGNOSIS — M4627 Osteomyelitis of vertebra, lumbosacral region: Secondary | ICD-10-CM | POA: Diagnosis not present

## 2021-10-01 DIAGNOSIS — B9561 Methicillin susceptible Staphylococcus aureus infection as the cause of diseases classified elsewhere: Secondary | ICD-10-CM | POA: Diagnosis not present

## 2021-10-01 DIAGNOSIS — E1142 Type 2 diabetes mellitus with diabetic polyneuropathy: Secondary | ICD-10-CM | POA: Diagnosis not present

## 2021-10-01 DIAGNOSIS — I150 Renovascular hypertension: Secondary | ICD-10-CM | POA: Diagnosis not present

## 2021-10-01 DIAGNOSIS — R7881 Bacteremia: Secondary | ICD-10-CM | POA: Diagnosis not present

## 2021-10-01 DIAGNOSIS — E1169 Type 2 diabetes mellitus with other specified complication: Secondary | ICD-10-CM | POA: Diagnosis not present

## 2021-10-01 DIAGNOSIS — N186 End stage renal disease: Secondary | ICD-10-CM | POA: Diagnosis not present

## 2021-10-01 DIAGNOSIS — E1122 Type 2 diabetes mellitus with diabetic chronic kidney disease: Secondary | ICD-10-CM | POA: Diagnosis not present

## 2021-10-01 DIAGNOSIS — D631 Anemia in chronic kidney disease: Secondary | ICD-10-CM | POA: Diagnosis not present

## 2022-04-11 ENCOUNTER — Ambulatory Visit: Payer: Medicare PPO | Admitting: Internal Medicine

## 2022-04-11 VITALS — BP 148/62 | HR 72 | Resp 12 | Wt 156.0 lb

## 2022-04-11 DIAGNOSIS — I10 Essential (primary) hypertension: Secondary | ICD-10-CM

## 2022-04-11 DIAGNOSIS — Z23 Encounter for immunization: Secondary | ICD-10-CM

## 2022-04-11 DIAGNOSIS — M545 Low back pain, unspecified: Secondary | ICD-10-CM

## 2022-04-11 DIAGNOSIS — D631 Anemia in chronic kidney disease: Secondary | ICD-10-CM

## 2022-04-11 DIAGNOSIS — N186 End stage renal disease: Secondary | ICD-10-CM

## 2022-04-11 DIAGNOSIS — E038 Other specified hypothyroidism: Secondary | ICD-10-CM

## 2022-04-11 DIAGNOSIS — E119 Type 2 diabetes mellitus without complications: Secondary | ICD-10-CM

## 2022-04-11 DIAGNOSIS — E1169 Type 2 diabetes mellitus with other specified complication: Secondary | ICD-10-CM

## 2022-04-11 MED ORDER — LEVOTHYROXINE SODIUM 75 MCG PO TABS: 75.0000 ug | ORAL_TABLET | Freq: Every day | ORAL | 11 refills | Status: DC

## 2022-04-11 NOTE — Progress Notes (Unsigned)
    Subjective:    Patient ID: Ryan Wells, male   DOB: 1956-04-06, 66 y.o.   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  [place]  Cannot recall who his Nephrologist is, but does go to [facility].  Appears was going to Dr. Justin Mend, who is no longer with [facility].  Has been on dialysis since 2018.    2.  DM:  Diagnosed in 2000.  Apparently, no longer requiring medication.  A1C highest in past was >15.5% in 2017.  Last A1C in this system shows an A1C of 5.1% 01/16/2021.  States he has lost 10 kg in recent years.  Has made changes to diet.  States he has not required medication for DM since starting the dialysis.    3.  Hypertension:  no longer on medication.  Apparently high when goes for dialysis, but BP drops during dialysis if takes meds.  4.  Chronic back pain:  uses Cyclobenzaprine for his back pain.  No history of injury.  See hospitalization from 01/15/2021 and finding of possible septic arthritis at L5-S1 associated with synovial cyst.  He was also being treated for MSSA bacteremia and endocarditis at the time and felt abx coverage was adequate for the possible septic arthritis.  Treated through 02/28/21 with Cefazolin  5.  Balance problems:  Difficulties with generalized weakness of legs--was in therapy for a long time for ambulation.  Uses walker to pull self to stand and ambulate safely.  6.  Anemia of chronic renal disease:  receives Aranesp with hemodialysis  7.  Hypothyroidism:  not clear when last took levothyroxine.  Current bottle dated 09/21/2021.  He thought he had just missed about 1 month of medication.  States he feels his energy is good.  Last TSH in [place] is high at 6.154 from 03/2021.  8.  Hyperlipidemia:  has been told his cholesterol is fine at dialysis.  9.  HM:  Has not had a Td in 10 years.  Has not had a shingles vaccine in past.  Current Meds  Medication  Sig   acetaminophen (TYLENOL) 325 MG tablet Take 2 tablets (650 mg total) by mouth every 6 (six) hours as needed for mild pain (or Fever >/= 101).   cyclobenzaprine (FLEXERIL) 5 MG tablet Take 1 tablet (5 mg total) by mouth 3 (three) times daily.   Darbepoetin Alfa (ARANESP) 60 MCG/0.3ML SOSY injection Inject 0.3 mLs (60 mcg total) into the vein every Friday with hemodialysis.   doxercalciferol (HECTOROL) 4 MCG/2ML injection Inject 2 mLs (4 mcg total) into the vein every Monday, Wednesday, and Friday with hemodialysis.   gabapentin (NEURONTIN) 100 MG capsule Take 1 capsule (100 mg total) by mouth 3 (three) times daily.   pentafluoroprop-tetrafluoroeth (GEBAUERS) AERO Apply 1 application topically as needed (topical anesthesia for hemodialysis).   VELPHORO 500 MG chewable tablet Chew 1,000 mg by mouth 3 (three) times daily with meals.  States he is only taking one 500 mg Velphoro (sucroferric oxyhydroxide daily  No Known Allergies   Review of Systems    Objective:   BP (!) 148/62 (BP Location: Right Arm, Patient Position: Sitting, Cuff Size: Normal)   Pulse 72   Resp 12   Wt 156 lb (70.8 kg)   BMI 28.53 kg/m   Physical Exam   Assessment & Plan   ***

## 2022-04-19 LAB — CBC WITH DIFFERENTIAL/PLATELET
Basophils Absolute: 0.1 10*3/uL (ref 0.0–0.2)
Basos: 1 %
EOS (ABSOLUTE): 0.1 10*3/uL (ref 0.0–0.4)
Eos: 1 %
Hematocrit: 33.7 % — ABNORMAL LOW (ref 37.5–51.0)
Hemoglobin: 11.5 g/dL — ABNORMAL LOW (ref 13.0–17.7)
Immature Grans (Abs): 0 10*3/uL (ref 0.0–0.1)
Immature Granulocytes: 0 %
Lymphocytes Absolute: 2.1 10*3/uL (ref 0.7–3.1)
Lymphs: 30 %
MCH: 31.8 pg (ref 26.6–33.0)
MCHC: 34.1 g/dL (ref 31.5–35.7)
MCV: 93 fL (ref 79–97)
Monocytes Absolute: 0.7 10*3/uL (ref 0.1–0.9)
Monocytes: 9 %
Neutrophils Absolute: 4.2 10*3/uL (ref 1.4–7.0)
Neutrophils: 59 %
Platelets: 162 10*3/uL (ref 150–450)
RBC: 3.62 x10E6/uL — ABNORMAL LOW (ref 4.14–5.80)
RDW: 12.2 % (ref 11.6–15.4)
WBC: 7.1 10*3/uL (ref 3.4–10.8)

## 2022-04-19 LAB — COMPREHENSIVE METABOLIC PANEL
ALT: 14 IU/L (ref 0–44)
AST: 7 IU/L (ref 0–40)
Albumin/Globulin Ratio: 1.7 (ref 1.2–2.2)
Albumin: 4.6 g/dL (ref 3.9–4.9)
Alkaline Phosphatase: 224 IU/L — ABNORMAL HIGH (ref 44–121)
BUN/Creatinine Ratio: 5 — ABNORMAL LOW (ref 10–24)
BUN: 34 mg/dL — ABNORMAL HIGH (ref 8–27)
Bilirubin Total: 0.4 mg/dL (ref 0.0–1.2)
CO2: 23 mmol/L (ref 20–29)
Calcium: 9.5 mg/dL (ref 8.6–10.2)
Chloride: 90 mmol/L — ABNORMAL LOW (ref 96–106)
Creatinine, Ser: 6.37 mg/dL — ABNORMAL HIGH (ref 0.76–1.27)
Globulin, Total: 2.7 g/dL (ref 1.5–4.5)
Glucose: 122 mg/dL — ABNORMAL HIGH (ref 70–99)
Potassium: 5.8 mmol/L — ABNORMAL HIGH (ref 3.5–5.2)
Sodium: 134 mmol/L (ref 134–144)
Total Protein: 7.3 g/dL (ref 6.0–8.5)
eGFR: 9 mL/min/{1.73_m2} — ABNORMAL LOW (ref >59)

## 2022-04-19 LAB — HGB A1C W/O EAG: Hgb A1c MFr Bld: 7 % — ABNORMAL HIGH (ref 4.8–5.6)

## 2022-04-19 LAB — LIPID PANEL W/O CHOL/HDL RATIO
Cholesterol, Total: 165 mg/dL (ref 100–199)
HDL: 48 mg/dL (ref >39)
LDL Chol Calc (NIH): 99 mg/dL (ref 0–99)
Triglycerides: 100 mg/dL (ref 0–149)
VLDL Cholesterol Cal: 18 mg/dL (ref 5–40)

## 2022-04-19 LAB — TSH: TSH: 10.1 u[IU]/mL — ABNORMAL HIGH (ref 0.450–4.500)

## 2022-05-23 ENCOUNTER — Telehealth: Payer: Self-pay

## 2022-05-23 ENCOUNTER — Other Ambulatory Visit (INDEPENDENT_AMBULATORY_CARE_PROVIDER_SITE_OTHER): Payer: Medicare HMO

## 2022-05-23 DIAGNOSIS — E038 Other specified hypothyroidism: Secondary | ICD-10-CM | POA: Diagnosis not present

## 2022-05-23 NOTE — Telephone Encounter (Signed)
Patient would like a sooner follow up to discuss dialysis concerns.

## 2022-05-24 LAB — TSH: TSH: 9.59 u[IU]/mL — ABNORMAL HIGH (ref 0.450–4.500)

## 2022-05-28 MED ORDER — LEVOTHYROXINE SODIUM 88 MCG PO TABS: 88.0000 ug | ORAL_TABLET | Freq: Every day | ORAL | 11 refills | Status: DC

## 2022-05-28 NOTE — Addendum Note (Signed)
Addended by: Marcelino Duster on: 05/28/2022 10:24 AM   Modules accepted: Orders

## 2022-06-05 ENCOUNTER — Ambulatory Visit: Payer: Medicare HMO | Admitting: Internal Medicine

## 2022-06-05 ENCOUNTER — Encounter: Payer: Self-pay | Admitting: Internal Medicine

## 2022-06-05 VITALS — BP 162/70 | HR 64 | Resp 16 | Ht 62.0 in | Wt 159.0 lb

## 2022-06-05 DIAGNOSIS — I10 Essential (primary) hypertension: Secondary | ICD-10-CM

## 2022-06-05 DIAGNOSIS — E119 Type 2 diabetes mellitus without complications: Secondary | ICD-10-CM

## 2022-06-05 DIAGNOSIS — Z5982 Transportation insecurity: Secondary | ICD-10-CM

## 2022-06-05 DIAGNOSIS — E038 Other specified hypothyroidism: Secondary | ICD-10-CM

## 2022-06-05 DIAGNOSIS — Z992 Dependence on renal dialysis: Secondary | ICD-10-CM

## 2022-06-05 DIAGNOSIS — E7800 Pure hypercholesterolemia, unspecified: Secondary | ICD-10-CM

## 2022-06-05 MED ORDER — GABAPENTIN 100 MG PO CAPS: ORAL_CAPSULE | ORAL | 11 refills | Status: DC

## 2022-06-05 MED ORDER — ATORVASTATIN CALCIUM 20 MG PO TABS: ORAL_TABLET | ORAL | 11 refills | Status: DC

## 2022-06-05 MED ORDER — ATORVASTATIN CALCIUM 20 MG PO TABS: 20.0000 mg | ORAL_TABLET | Freq: Every day | ORAL | 11 refills | Status: DC

## 2022-06-05 NOTE — Progress Notes (Unsigned)
Subjective:    Patient ID: Ryan Wells, male   DOB: 1956/07/02, 66 y.o.   MRN: [id number]   HPI Ryan Wells interprets  Here for concerns about his dialysis settings.   Muscle Cramps with dialysis:  States 15 to 20 minutes into his dialysis, he begins having muscle cramps or a sense of his feet and legs and body being compressed that  initiate in tips of toes and eventually extend all the way up to his chest and lasts the entire 4 hour treatment.  States he lets them know and they give him O2, which he uses the entire treatment. He will continue to have discomfort with the cramping until the following morning.   They have an interpreter virtually, though not always available.  He thinks they understand what he is saying. When they don't have an interpreter, he is not certain what is understood.   This started with dialysis at beginning of October.  He believes they changed his dialysis treatment just before this started.  They are apparently started switching the settings every 15 minutes since then.  The more times they change it, the worse he feels. Very worn out as compared to before October at end of session. He does have some mild edema of legs in between dialysis treatment. He states he is good about watching carb intake and sodium , but his wife disagrees.  She does use some salt in her cooking. Finds him hard to please--won't eat many veggies.  Not using his gabapentin regularly--wants to see how much of his medicine he can get by without taking.  Has not taken for at least the last week.      2.  DM:  not taking medication and A1C went from 5.1% in 2022 to 7.0 % 04/2022.  Eating more breads, bananas (ultimately changes his tune on this and not eating as much)  3.  Elevated TSH with normal range free T4:  continues apparently on 75 mcg of levothyroxine daily.  Did not yet get the message to increase to 88 mcg.    4.  HM:  had influenza vaccine today with dialysis.  Has not  had new COVID vaccine.  Has not had Shingrix.    Current Meds  Medication Sig   acetaminophen (TYLENOL) 325 MG tablet Take 2 tablets (650 mg total) by mouth every 6 (six) hours as needed for mild pain (or Fever >/= 101).   Darbepoetin Alfa (ARANESP) 60 MCG/0.3ML SOSY injection Inject 0.3 mLs (60 mcg total) into the vein every Friday with hemodialysis.   doxercalciferol (HECTOROL) 4 MCG/2ML injection Inject 2 mLs (4 mcg total) into the vein every Monday, Wednesday, and Friday with hemodialysis.   gabapentin (NEURONTIN) 100 MG capsule Take 1 capsule (100 mg total) by mouth 3 (three) times daily.   levothyroxine (SYNTHROID) 88 MCG tablet Take 1 tablet (88 mcg total) by mouth daily.   pentafluoroprop-tetrafluoroeth (GEBAUERS) AERO Apply 1 application topically as needed (topical anesthesia for hemodialysis).   VELPHORO 500 MG chewable tablet Chew 1,000 mg by mouth 3 (three) times daily with meals.   No Known Allergies   Review of Systems    Objective:   BP (!) 162/70 (BP Location: Right Arm, Patient Position: Sitting, Cuff Size: Normal)   Pulse 64   Resp 16   Ht 5\' 2"  (1.575 m)   Wt 159 lb (72.1 kg)   BMI 29.08 kg/m   Physical Exam   Assessment & Plan  Muscle Cramps with dialysis:  states he is already doing exercises when cramping starts.  Restart gabapenint and increase to twice daily  Shingles in right deltoid next time in--had influenza today.

## 2022-06-07 NOTE — Telephone Encounter (Signed)
Seen by Dr Mulberry 

## 2022-07-11 ENCOUNTER — Other Ambulatory Visit: Payer: Medicare HMO

## 2022-07-11 DIAGNOSIS — E038 Other specified hypothyroidism: Secondary | ICD-10-CM

## 2022-07-11 DIAGNOSIS — Z79899 Other long term (current) drug therapy: Secondary | ICD-10-CM

## 2022-07-11 DIAGNOSIS — E7800 Pure hypercholesterolemia, unspecified: Secondary | ICD-10-CM

## 2022-07-12 LAB — COMPREHENSIVE METABOLIC PANEL
ALT: 17 IU/L (ref 0–44)
AST: 15 IU/L (ref 0–40)
Albumin/Globulin Ratio: 1.7 (ref 1.2–2.2)
Albumin: 4.4 g/dL (ref 3.9–4.9)
Alkaline Phosphatase: 142 IU/L — ABNORMAL HIGH (ref 44–121)
BUN/Creatinine Ratio: 4 — ABNORMAL LOW (ref 10–24)
BUN: 22 mg/dL (ref 8–27)
Bilirubin Total: 0.8 mg/dL (ref 0.0–1.2)
CO2: 26 mmol/L (ref 20–29)
Calcium: 9.5 mg/dL (ref 8.6–10.2)
Chloride: 94 mmol/L — ABNORMAL LOW (ref 96–106)
Creatinine, Ser: 5.21 mg/dL — ABNORMAL HIGH (ref 0.76–1.27)
Globulin, Total: 2.6 g/dL (ref 1.5–4.5)
Glucose: 108 mg/dL — ABNORMAL HIGH (ref 70–99)
Potassium: 4.4 mmol/L (ref 3.5–5.2)
Sodium: 140 mmol/L (ref 134–144)
Total Protein: 7 g/dL (ref 6.0–8.5)
eGFR: 11 mL/min/{1.73_m2} — ABNORMAL LOW (ref >59)

## 2022-07-12 LAB — LIPID PANEL W/O CHOL/HDL RATIO
Cholesterol, Total: 100 mg/dL (ref 100–199)
HDL: 51 mg/dL (ref >39)
LDL Chol Calc (NIH): 31 mg/dL (ref 0–99)
Triglycerides: 93 mg/dL (ref 0–149)
VLDL Cholesterol Cal: 18 mg/dL (ref 5–40)

## 2022-07-12 LAB — TSH+FREE T4
Free T4: 1.84 ng/dL — ABNORMAL HIGH (ref 0.82–1.77)
TSH: 6.19 u[IU]/mL — ABNORMAL HIGH (ref 0.450–4.500)

## 2022-07-15 ENCOUNTER — Ambulatory Visit (INDEPENDENT_AMBULATORY_CARE_PROVIDER_SITE_OTHER): Payer: Medicare HMO | Admitting: Internal Medicine

## 2022-07-15 ENCOUNTER — Encounter: Payer: Self-pay | Admitting: Internal Medicine

## 2022-07-15 VITALS — BP 160/70 | HR 76 | Resp 20 | Ht 62.0 in | Wt 160.0 lb

## 2022-07-15 DIAGNOSIS — R600 Localized edema: Secondary | ICD-10-CM

## 2022-07-15 DIAGNOSIS — N186 End stage renal disease: Secondary | ICD-10-CM

## 2022-07-15 DIAGNOSIS — E119 Type 2 diabetes mellitus without complications: Secondary | ICD-10-CM | POA: Diagnosis not present

## 2022-07-15 DIAGNOSIS — I10 Essential (primary) hypertension: Secondary | ICD-10-CM | POA: Diagnosis not present

## 2022-07-15 DIAGNOSIS — E038 Other specified hypothyroidism: Secondary | ICD-10-CM

## 2022-07-15 DIAGNOSIS — Z992 Dependence on renal dialysis: Secondary | ICD-10-CM

## 2022-07-15 DIAGNOSIS — E7800 Pure hypercholesterolemia, unspecified: Secondary | ICD-10-CM

## 2022-07-15 DIAGNOSIS — M545 Low back pain, unspecified: Secondary | ICD-10-CM

## 2022-07-15 DIAGNOSIS — E1142 Type 2 diabetes mellitus with diabetic polyneuropathy: Secondary | ICD-10-CM | POA: Insufficient documentation

## 2022-07-15 MED ORDER — ATORVASTATIN CALCIUM 20 MG PO TABS: 20.0000 mg | ORAL_TABLET | Freq: Every day | ORAL | 11 refills | Status: DC

## 2022-07-15 MED ORDER — GABAPENTIN 100 MG PO CAPS: ORAL_CAPSULE | ORAL | 11 refills | Status: DC

## 2022-07-15 MED ORDER — LEVOTHYROXINE SODIUM 88 MCG PO TABS: 88.0000 ug | ORAL_TABLET | Freq: Every day | ORAL | 11 refills | Status: DC

## 2022-07-15 NOTE — Progress Notes (Signed)
Subjective:    Patient ID: Ryan Wells, male   DOB: May 02, 1956, 66 y.o.   MRN: [id number]   HPI  [name] interprets   DM:  Wife states he is eating more veggies.  A1C in September at 7.0%  Has not been on medication for DM since 2018--insulin and metformin.  Wife states he has made a little change in his diet--eating more vegetables.  Not eating bananas any longer.  Wife states he continues to be a picky eater and perhaps not eating as he should. Has not had a diabetic eye exam in years.  Cannot recall name of previous eye doctor. They would like someone closer to their home  2.  Muscle cramps with dialysis:  cramps have resolved.  He states certain nurses/techs at the dialysis center are no longer allowed to work with him and wonders if they were doing something wrong that led to his cramps.  Looks like Vitamin B and Folate prescribed about the same time he complained about the cramps here.  Suspect this has helped.   [place] on [address] also last week he had to go to BR during dialysis.  He was unhooked and then noted in BR he had blood running down arm and entire arm swelled up.  He has complained of discrimination there.   He is also back on Gabapentin twice daily since last visit.  He also feels this helped with muscle cramps.  Able to walk more.  Back pain is better as well.  He is sleeping well also.  States previously he was up all night with discomforts.  3.  Hypothyroidism:  Feeling better since increase in levothyroxine to 88 mcg.  More energy and feels has helped with sleep as well.  TSH a bit high still at 6.190, but Free T4 now a bit high at 1.84 as well.  Previously with subclinical hypothyroidism and now appears to have some pituitary/thyroid axis issues.  4.  Hyperlipidemia:  Cholesterol panel now at goal.  He was not taking statin regularly before  5.  HM:  we are out of shingrix.  He does have Medicare.  Encouraged obtaining at pharmacy  Current  Meds  Medication Sig   acetaminophen (TYLENOL) 325 MG tablet Take 2 tablets (650 mg total) by mouth every 6 (six) hours as needed for mild pain (or Fever >/= 101).   atorvastatin (LIPITOR) 20 MG tablet Take 1 tablet (20 mg total) by mouth daily.   B Complex-C-Folic Acid (DIALYVITE 921) 0.8 MG TABS Take 0.8 mg by mouth daily. Take 1 tablet by mouth every day with a meal   Darbepoetin Alfa (ARANESP) 60 MCG/0.3ML SOSY injection Inject 0.3 mLs (60 mcg total) into the vein every Friday with hemodialysis.   doxercalciferol (HECTOROL) 4 MCG/2ML injection Inject 2 mLs (4 mcg total) into the vein every Monday, Wednesday, and Friday with hemodialysis.   gabapentin (NEURONTIN) 100 MG capsule 1 cap by mouth in morning with meal and 1 cap by mouth at bedtime   levothyroxine (SYNTHROID) 88 MCG tablet Take 1 tablet (88 mcg total) by mouth daily.   pentafluoroprop-tetrafluoroeth (GEBAUERS) AERO Apply 1 application topically as needed (topical anesthesia for hemodialysis).   No Known Allergies   Review of Systems    Objective:   BP (!) 160/70 (BP Location: Right Arm, Patient Position: Sitting, Cuff Size: Normal)   Pulse 76   Resp 20   Ht 5\' 2"  (1.575 m)   Wt 160 lb (  72.6 kg)   BMI 29.26 kg/m   Physical Exam NAD HEENT:  PERR, EOMI Neck:  Supple, No adenopathy Chest:  CTA CV:  RRR without murmur or rub.  No carotid bruits.  Carotid, radial pulses normal and equal Abd:  S, NT, No HS or mass, + BS Left lower arm:  mild to moderate edema and puffiness of dorsal left hand extending up to elbow below AV fistula.   Dressing over AV fistula from dialysis today LE:  mild to moderate pitting edema of LE to below knee bilaterally. Diabetic foot exam was performed with the following findings:   No deformities, ulcerations, or other skin breakdown Unable to palpate PT pulses. Skin is shiny with edema at ankles and above.   No sensing of monofilament with right foot to high pretib area. Sensing of  monofilament not consistent with left foot.  Not clear if actually feeling.       Assessment & Plan   DM:  To continue to work on diet.  No meds to be added for now.  Januvia 25 mg daily likely choice if start medication again.  Referral to Ophthalmology for diabetic eye exam.  A1C repeat in 3 months  2.  Hypertension:  drops bp with dialysis on meds, so currently not taking medication.  Need to find out if has established with nephrology again.  3.  Muscle cramps:  resolved.  Not clear what treatment has helped.  Vitamin B supplement, Gabapentin, change in levothyroxine or if dialysis unit made changes.    4.  Hyperlipidemia:  now at good range with LDL  5.  Peripheral neuropathy, low back pain:  improved with gabapentin  6.  LE edema:  talks in circles when asked if would try graduated compression stockings.  Basically, with no interest.  Does not like socks of any type.  7.  Subclinical hypothyroidism:  Not clear why his Free T4 is high. Previously normal with high TSH.  With ESRD, likely axis abnormality.  Will recheck TSH, FT4, FT3 in 3 months and if still abnormal, particularly the latter two, will reassess dosing of levothyroxine.  8.  HM:  recommend obtaining RSV, COVID 2023-2024, Shingrix at pharmacy of choice or PHD.

## 2022-07-15 NOTE — Patient Instructions (Addendum)
Recommend obtaining the following vaccines:   RSV COVID 19 2023-2024 Shingrix #1

## 2022-10-07 ENCOUNTER — Other Ambulatory Visit (INDEPENDENT_AMBULATORY_CARE_PROVIDER_SITE_OTHER): Payer: Medicare HMO

## 2022-10-07 DIAGNOSIS — E119 Type 2 diabetes mellitus without complications: Secondary | ICD-10-CM | POA: Diagnosis not present

## 2022-10-07 DIAGNOSIS — E038 Other specified hypothyroidism: Secondary | ICD-10-CM | POA: Diagnosis not present

## 2022-10-08 LAB — T3, FREE: T3, Free: 2.2 pg/mL (ref 2.0–4.4)

## 2022-10-08 LAB — HEMOGLOBIN A1C
Est. average glucose Bld gHb Est-mCnc: 148 mg/dL
Hgb A1c MFr Bld: 6.8 % — ABNORMAL HIGH (ref 4.8–5.6)

## 2022-10-08 LAB — TSH: TSH: 3.34 u[IU]/mL (ref 0.450–4.500)

## 2022-10-08 LAB — T4, FREE: Free T4: 1.84 ng/dL — ABNORMAL HIGH (ref 0.82–1.77)

## 2022-10-14 ENCOUNTER — Other Ambulatory Visit: Payer: Medicare HMO

## 2022-10-17 ENCOUNTER — Ambulatory Visit: Payer: Medicare HMO | Admitting: Internal Medicine

## 2022-10-21 ENCOUNTER — Ambulatory Visit: Payer: Medicare HMO | Admitting: Internal Medicine

## 2023-01-09 ENCOUNTER — Other Ambulatory Visit: Payer: Medicare HMO

## 2023-01-16 ENCOUNTER — Ambulatory Visit: Payer: Medicare HMO | Admitting: Internal Medicine

## 2023-04-17 ENCOUNTER — Other Ambulatory Visit: Payer: Medicare HMO

## 2023-04-17 DIAGNOSIS — E038 Other specified hypothyroidism: Secondary | ICD-10-CM | POA: Diagnosis not present

## 2023-04-17 DIAGNOSIS — E119 Type 2 diabetes mellitus without complications: Secondary | ICD-10-CM | POA: Diagnosis not present

## 2023-04-17 DIAGNOSIS — Z5982 Transportation insecurity: Secondary | ICD-10-CM

## 2023-04-17 NOTE — Progress Notes (Signed)
amb  

## 2023-04-17 NOTE — Addendum Note (Signed)
Addended by: Marcene Duos on: 04/17/2023 09:32 AM   Modules accepted: Orders

## 2023-04-18 ENCOUNTER — Other Ambulatory Visit: Payer: Medicare HMO

## 2023-04-18 LAB — HEMOGLOBIN A1C
Est. average glucose Bld gHb Est-mCnc: 128 mg/dL
Hgb A1c MFr Bld: 6.1 % — ABNORMAL HIGH (ref 4.8–5.6)

## 2023-04-18 LAB — TSH: TSH: 1.84 u[IU]/mL (ref 0.450–4.500)

## 2023-04-21 ENCOUNTER — Ambulatory Visit: Payer: Medicare HMO | Admitting: Internal Medicine

## 2023-04-22 ENCOUNTER — Encounter: Payer: Self-pay | Admitting: Internal Medicine

## 2023-04-22 ENCOUNTER — Ambulatory Visit: Payer: Medicare HMO | Admitting: Internal Medicine

## 2023-04-22 VITALS — BP 138/60 | HR 67 | Resp 16 | Ht 62.0 in | Wt 147.5 lb

## 2023-04-22 DIAGNOSIS — E1169 Type 2 diabetes mellitus with other specified complication: Secondary | ICD-10-CM | POA: Insufficient documentation

## 2023-04-22 DIAGNOSIS — Z23 Encounter for immunization: Secondary | ICD-10-CM

## 2023-04-22 DIAGNOSIS — I10 Essential (primary) hypertension: Secondary | ICD-10-CM

## 2023-04-22 DIAGNOSIS — E119 Type 2 diabetes mellitus without complications: Secondary | ICD-10-CM

## 2023-04-22 DIAGNOSIS — E038 Other specified hypothyroidism: Secondary | ICD-10-CM

## 2023-04-22 DIAGNOSIS — N186 End stage renal disease: Secondary | ICD-10-CM

## 2023-04-22 DIAGNOSIS — Z125 Encounter for screening for malignant neoplasm of prostate: Secondary | ICD-10-CM

## 2023-04-22 NOTE — Progress Notes (Unsigned)
Subjective:    Patient ID: Ryan Wells, male   DOB: 06/29/1956, 67 y.o.   MRN: [id number]   HPI  Ryan Wells interprets   DM:  recent A1C at goal at 6.1%.    2.  Hypothyroidism:  TSH in therapeutic range at 1.840.  3.  ESRD:  Continues with hemodialysis, 3 times weekly.  He has a different nurse each time he undergoes the dialysis.  He will get headaches with one nurse and chest pain with another and leg cramps with the 3rd nurse.  Reportedly, he is being referred to cardiology for the chest pain.   Also, brings up he was to have renal transplant 3 times and someone each time caused a problem with him going forward with a transplant.   Was given multiple injections that left him in poor shape Was in a wheelchair and said they could not do it with the poor shape he was in 3rd:  they just canceled the appt and told him that was it.   He is planning to move to another state to get care.  States he has children in different states he could stay with.   Shows me a pill that ultimately turns out to be calcitriol.  He is suspicious as to why they are giving him.  Tried to discuss, but sounds like he is at odds with family and caregivers at dialysis unit.   He cannot recall when his last eye check was.  Looks like we tried to send to 2 different eye providers last fall and not clear what happened with both of them.     Current Meds  Medication Sig   acetaminophen (TYLENOL) 325 MG tablet Take 2 tablets (650 mg total) by mouth every 6 (six) hours as needed for mild pain (or Fever >/= 101).   atorvastatin (LIPITOR) 20 MG tablet Take 1 tablet (20 mg total) by mouth daily.   B Complex-C-Folic Acid (DIALYVITE 800) 0.8 MG TABS Take 0.8 mg by mouth daily. Take 1 tablet by mouth every day with a meal   Darbepoetin Alfa (ARANESP) 60 MCG/0.3ML SOSY injection Inject 0.3 mLs (60 mcg total) into the vein every Friday with hemodialysis.   doxercalciferol (HECTOROL) 4 MCG/2ML injection Inject 2 mLs  (4 mcg total) into the vein every Monday, Wednesday, and Friday with hemodialysis.   gabapentin (NEURONTIN) 100 MG capsule 1 cap by mouth in morning with meal and 1 cap by mouth at bedtime   levothyroxine (SYNTHROID) 88 MCG tablet Take 1 tablet (88 mcg total) by mouth daily.   pentafluoroprop-tetrafluoroeth (GEBAUERS) AERO Apply 1 application topically as needed (topical anesthesia for hemodialysis).   No Known Allergies   Review of Systems    Objective:   BP 138/60 (BP Location: Right Arm, Patient Position: Sitting, Cuff Size: Normal)   Pulse 67   Resp 16   Ht 5\' 2"  (1.575 m)   Wt 147 lb 8 oz (66.9 kg)   BMI 26.98 kg/m   Physical Exam Skin appears darkened--states he has been out in garden a lot now he has more energy HEENT:  PERRL, EOMI Neck:  Supple, No adenopathy Chest:  CTA CV:  RRR with normal S1 and S2, No S3, S4 or murmur.  Carotid, radial and DP pulses normal and equal LE:  trace edema to pretib area.  Skin hairless and shiny.  Diabetic Foot Exam - Simple   Simple Foot Form Diabetic Foot exam was performed with the following findings: Yes  04/22/2023  3:05 PM  Visual Inspection No deformities, no ulcerations, no other skin breakdown bilaterally: Yes See comments: Yes Sensation Testing Intact to touch and monofilament testing bilaterally: Yes Pulse Check Posterior Tibialis and Dorsalis pulse intact bilaterally: Yes Comments Skin shiny.  Mild thickening and discoloration of toenails     Assessment & Plan   ESRD on hemodialysis:  call into dialysis center and message left to call as have questions.  Patient apparently having negative interactions with personnel.  Also with family members.  Not clear why he is not a candidate for renal transplant.  It does appear he is being referred to cardiology, but want to be certain.  2.  Chest pain and other complaints associated with dialysis:  as above.  Referral to cardiology if that has not been done yet.  3.  DM:   controlled.  Referral to Dr Elmer Picker, ophthalmology.    4.  Hypothyroidism:  adequately replaced.  5.  Hyperlipidemia:  good levels in November.  Repeat prior to CPE in 4 months.  6.  HM:  add PSA to labs.  Shingrix #1/2 and high dose influenza vaccines today.

## 2023-04-24 LAB — SPECIMEN STATUS REPORT

## 2023-04-24 LAB — PSA: Prostate Specific Ag, Serum: 0.1 ng/mL (ref 0.0–4.0)

## 2023-05-23 ENCOUNTER — Other Ambulatory Visit: Payer: Self-pay | Admitting: Internal Medicine

## 2023-06-22 NOTE — Progress Notes (Deleted)
Cardiology Office Note:    Date:  06/22/2023   ID:  Ryan Wells, DOB 1956/08/01, MRN [id number]  PCP:  Ryan Manson, MD  Cardiologist:  Ryan Red, MD  Electrophysiologist:  None   Referring MD: Ryan Level, MD   No chief complaint on file. ***  History of Present Illness:    [name] is a 67 y.o. male with a hx of ESRD, hypertension, T2DM, AAA who is referred by Dr Ryan Wells for evaluation of chest pain.  Underwent TEE 01/2021 for MSSA bacteremia, showed no evidence of endocarditis.  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  Did not get outpatient stress testing following hospitalization.  No chest pain since   Chronic kidney disease    on dialysis Tues, Thurs and Sat   Diabetes mellitus without complication (HCC)    Diabetes type 2, uncontrolled 11/25/2011   no meds, diet controlled per patient   Diabetic gastroparesis (HCC) 01/22/2017   Hypertension    no meds   Microalbuminuria 01/19/2017   Myocardial infarction [facility]) 2015    Past Surgical History:  Procedure Laterality Date   A/V FISTULAGRAM Left 06/04/2019   Procedure: A/V FISTULAGRAM;  Surgeon: Chuck Hint, MD;  Location: [facility] INVASIVE CV LAB;  Service: Cardiovascular;  Laterality: Left;   A/V FISTULAGRAM Left 10/15/2019   Procedure: A/V FISTULAGRAM;  Surgeon: Chuck Hint, MD;  Location: [facility] INVASIVE CV LAB;  Service: Cardiovascular;  Laterality: Left;   A/V FISTULAGRAM N/A 01/24/2021   Procedure: A/V FISTULAGRAM - Left Upper;  Surgeon: Leonie Douglas, MD;  Location: [facility] INVASIVE CV LAB;  Service: Cardiovascular;  Laterality: N/A;   AV FISTULA PLACEMENT Left 07/02/2018   Procedure: BRACHIOCEPHALIC ARTERIOVENOUS (AV) FISTULA CREATION LEFT ARM;  Surgeon: Nada Libman, MD;  Location: [facility] OR;   Service: Vascular;  Laterality: Left;   BASCILIC VEIN TRANSPOSITION Left 07/26/2019   Procedure: Bascilic Vein Transposition;  Surgeon: Chuck Hint, MD;  Location: [facility] OR;  Service: Vascular;  Laterality: Left;   COLONOSCOPY     EMBOLIZATION (CATH LAB) Left 06/04/2019   Procedure: EMBOLIZATION;  Surgeon: Chuck Hint, MD;  Location: [facility] INVASIVE CV LAB;  Service: Cardiovascular;  Laterality: Left;  LT ARM FISTULA/COMPETING BRANCH   EYE SURGERY Bilateral    cataracts x2   EYE SURGERY     INSERTION OF DIALYSIS CATHETER Right 06/08/2019   Procedure: INSERTION OF PALINDROME DIALYSIS CATHETER IN RIGHT INTERNAL JUGULAR;  Surgeon: Chuck Hint, MD;  Location: [facility] OR;  Service: Vascular;  Laterality: Right;   LIGATION OF ARTERIOVENOUS  FISTULA Left 07/26/2019   Procedure: Ligation Of BrachioCephalic  Fistula;  Surgeon: Chuck Hint, MD;  Location: [facility] OR;  Service: Vascular;  Laterality: Left;   NECK SURGERY     PERIPHERAL VASCULAR BALLOON ANGIOPLASTY Left 06/04/2019   Procedure: PERIPHERAL VASCULAR BALLOON ANGIOPLASTY;  Surgeon: Chuck Hint, MD;  Location: [facility] INVASIVE CV LAB;  Service: Cardiovascular;  Laterality: Left;  ARM FISTULA   PERIPHERAL VASCULAR BALLOON ANGIOPLASTY Left 10/15/2019   Procedure: PERIPHERAL VASCULAR BALLOON ANGIOPLASTY;  Surgeon: Chuck Hint, MD;  Location: [facility] INVASIVE CV LAB;  Service: Cardiovascular;  Laterality: Left;  arm fistula   TEE WITHOUT CARDIOVERSION N/A 01/19/2021   Procedure: TRANSESOPHAGEAL ECHOCARDIOGRAM (TEE);  Surgeon: Jake Bathe, MD;  Location: [facility] ENDOSCOPY;  Service: Cardiovascular;  Laterality: N/A;    Current Medications: No outpatient medications have been marked as taking for the 06/24/23 encounter (Appointment) with Ryan Ishikawa, MD.     Allergies:   Patient has no known allergies.   Social History   Socioeconomic History   Marital status: Married    Spouse name: Ryan Wells   Number of children: 3   Years of education: 1 year university   Highest education Wells: Not on file  Occupational History   Occupation: unemployed  Tobacco Use   Smoking status: Former   Smokeless tobacco: Never  Advertising account planner   Vaping status: Never Used  Substance and Sexual Activity   Alcohol use: Not Currently   Drug use: Never   Sexual activity: Yes  Other Topics Concern   Not on file  Social History Narrative   ** Merged History Encounter **       Originally from [place] Came to [employer]. In 1985 Lives in [place] neighborhood with wife and 3 sons.   Sons are 15-30 yo Some sons help with support   Social Determinants of Corporate investment banker Strain: Not on file  Food Insecurity: Not on file  Transportation Needs: Not on file  Physical Activity: Not on file  Stress: Not on file  Social Connections: Not on file     Family History: The patient's ***family history includes Diabetes in his brother, father, and sister; Heart disease in his mother; Kidney disease in his father. There is no history of Colon cancer, Stomach cancer, or Esophageal cancer.  ROS:   Please see the history of present illness.    *** All other systems reviewed and are negative.  EKGs/Labs/Other Studies Reviewed:    The following studies were reviewed today: ***  EKG:  EKG is *** ordered today.  The ekg ordered today demonstrates ***  Recent Labs: 07/11/2022: ALT 17; BUN 22; Creatinine, Ser 5.21; Potassium 4.4; Sodium 140 04/17/2023: TSH 1.840  Recent Lipid Panel    Component Value Date/Time   CHOL 100 07/11/2022 0924   TRIG 93 07/11/2022 0924   HDL 51 07/11/2022 0924   CHOLHDL 4.2 11/27/2013 1709   VLDL 25 11/27/2013 1709   LDLCALC 31 07/11/2022 0924    Physical Exam:    VS:  There were no vitals taken for this visit.    Wt Readings from Last 3 Encounters:  04/22/23 147 lb 8 oz (66.9 kg)  07/15/22 160 lb (72.6 kg)  06/05/22 159 lb (72.1 kg)     GEN: ***  Well nourished, well developed in no acute distress HEENT: Normal NECK: No JVD; No carotid bruits LYMPHATICS: No lymphadenopathy CARDIAC: ***RRR, no murmurs, rubs, gallops RESPIRATORY:  Clear to auscultation without rales, wheezing or rhonchi  ABDOMEN: Soft, non-tender, non-distended MUSCULOSKELETAL:  No edema; No deformity  SKIN: Warm and dry NEUROLOGIC:  Alert and oriented x 3 PSYCHIATRIC:  Normal affect   ASSESSMENT:    No diagnosis found. PLAN:    Chest pain:  ESRD: On HD  Hyperlipidemia: On atorvastatin 20 mg daily  RTC in***   Medication Adjustments/Labs and Tests Ordered: Current medicines are reviewed at length with the patient today.  Concerns regarding medicines are outlined above.  No orders of the defined types were placed in this encounter.  No orders of the defined types were placed in this encounter.   There are no Patient Instructions on file for this visit.   Signed,  Ryan Ishikawa, MD  06/22/2023 3:06 PM    [facility]

## 2023-06-24 ENCOUNTER — Ambulatory Visit: Payer: Medicare HMO | Attending: Cardiology | Admitting: Cardiology

## 2023-08-24 NOTE — Progress Notes (Signed)
 Cardiology Office Note:    Date:  08/26/2023   ID:  Ryan Wells, DOB 01-08-1956, MRN [id number]  PCP:  Adella Norris, MD  Cardiologist:  Shelda Bruckner, MD  Electrophysiologist:  None   Referring MD: Macel Jayson PARAS, MD   Chief Complaint  Patient presents with   Chest Pain    History of Present Illness:    Ryan Wells is a 68 y.o. male with a hx of ESRD, hypertension, T2DM, MSSA bacteremia,who is referred by Dr. Macel for evaluation of chest pain.  He was admitted 01/2021 with MSSA bacteremia.  TEE was negative for vegetation.  TTE 01/16/2021 showed EF 55 to 60%, normal RV function, no significant valvular disease.  TEE 01/19/2021 showed EF 60 to 65%, normal RV function, no significant valvular disease.  He reports he has been having chest pain during dialysis.  Describes as left-sided chest pressure, can last his whole dialysis session.  Resolves after dialysis ends.  He had syncopal episode about 1 month ago.  States that it occurred when he was walking into his home after dialysis.  States that he collapsed on the floor, was unconscious for hours.  He hit his head on the doorknob when he went down.  States that he is collapsed about 5-6 times over the last 5 years.  Always occurs after dialysis.   Past Medical History:  Diagnosis Date   3rd nerve palsy, partial, right 01/03/2017   AAA (abdominal aortic aneurysm) [facility])    Not noted on CT abd 2019   Chest pain 11/2013   Normal Echo/ EKG/enzymes-hospitalized.  Did not get outpatient stress testing following hospitalization.  No chest pain since   Chronic kidney disease    on dialysis Tues, Thurs and Sat   Diabetes mellitus without complication (HCC)    Diabetes type 2, uncontrolled 11/25/2011   no meds, diet controlled per patient   Diabetic gastroparesis (HCC) 01/22/2017   Hypertension    no meds   Microalbuminuria 01/19/2017   Myocardial infarction [facility]) 2015    Past Surgical History:   Procedure Laterality Date   A/V FISTULAGRAM Left 06/04/2019   Procedure: A/V FISTULAGRAM;  Surgeon: Eliza Bruckner RAMAN, MD;  Location: [facility] INVASIVE CV LAB;  Service: Cardiovascular;  Laterality: Left;   A/V FISTULAGRAM Left 10/15/2019   Procedure: A/V FISTULAGRAM;  Surgeon: Eliza Bruckner RAMAN, MD;  Location: [facility] INVASIVE CV LAB;  Service: Cardiovascular;  Laterality: Left;   A/V FISTULAGRAM N/A 01/24/2021   Procedure: A/V FISTULAGRAM - Left Upper;  Surgeon: Magda Debby SAILOR, MD;  Location: [facility] INVASIVE CV LAB;  Service: Cardiovascular;  Laterality: N/A;   AV FISTULA PLACEMENT Left 07/02/2018   Procedure: BRACHIOCEPHALIC ARTERIOVENOUS (AV) FISTULA CREATION LEFT ARM;  Surgeon: Serene Gaile ORN, MD;  Location: [facility] OR;  Service: Vascular;  Laterality: Left;   BASCILIC VEIN TRANSPOSITION Left 07/26/2019   Procedure: Bascilic Vein Transposition;  Surgeon: Eliza Bruckner RAMAN, MD;  Location: [facility] OR;  Service: Vascular;  Laterality: Left;   COLONOSCOPY     EMBOLIZATION (CATH LAB) Left 06/04/2019   Procedure: EMBOLIZATION;  Surgeon: Eliza Bruckner RAMAN, MD;  Location: [facility] INVASIVE CV LAB;  Service: Cardiovascular;  Laterality: Left;  LT ARM FISTULA/COMPETING BRANCH   EYE SURGERY Bilateral    cataracts x2   EYE SURGERY     INSERTION OF DIALYSIS CATHETER Right 06/08/2019   Procedure: INSERTION OF PALINDROME DIALYSIS CATHETER IN RIGHT INTERNAL JUGULAR;  Surgeon: Eliza Bruckner RAMAN, MD;  Location: [facility] OR;  Service: Vascular;  Laterality: Right;   LIGATION OF ARTERIOVENOUS  FISTULA Left 07/26/2019   Procedure: Ligation Of BrachioCephalic  Fistula;  Surgeon: Eliza Lonni RAMAN, MD;  Location: [facility] OR;  Service: Vascular;  Laterality: Left;   NECK SURGERY     PERIPHERAL VASCULAR BALLOON ANGIOPLASTY Left 06/04/2019   Procedure: PERIPHERAL VASCULAR BALLOON ANGIOPLASTY;  Surgeon: Eliza Lonni RAMAN, MD;  Location: [facility] INVASIVE CV LAB;  Service: Cardiovascular;  Laterality: Left;  ARM FISTULA    PERIPHERAL VASCULAR BALLOON ANGIOPLASTY Left 10/15/2019   Procedure: PERIPHERAL VASCULAR BALLOON ANGIOPLASTY;  Surgeon: Eliza Lonni RAMAN, MD;  Location: [facility] INVASIVE CV LAB;  Service: Cardiovascular;  Laterality: Left;  arm fistula   TEE WITHOUT CARDIOVERSION N/A 01/19/2021   Procedure: TRANSESOPHAGEAL ECHOCARDIOGRAM (TEE);  Surgeon: Jeffrie Oneil BROCKS, MD;  Location: [facility] ENDOSCOPY;  Service: Cardiovascular;  Laterality: N/A;    Current Medications: Current Meds  Medication Sig   acetaminophen  (TYLENOL ) 325 MG tablet Take 2 tablets (650 mg total) by mouth every 6 (six) hours as needed for mild pain (or Fever >/= 101).   atorvastatin  (LIPITOR) 20 MG tablet TAKE 1 TABLET BY MOUTH DAILY WITH THE EVENING MEAL   B Complex-C-Folic Acid  (DIALYVITE 800) 0.8 MG TABS Take 0.8 mg by mouth daily. Take 1 tablet by mouth every day with a meal   Darbepoetin Alfa  (ARANESP ) 60 MCG/0.3ML SOSY injection Inject 0.3 mLs (60 mcg total) into the vein every Friday with hemodialysis.   diclofenac  (FLECTOR ) 1.3 % PTCH Place 1 patch onto the skin 2 (two) times daily.   doxercalciferol  (HECTOROL ) 4 MCG/2ML injection Inject 2 mLs (4 mcg total) into the vein every Monday, Wednesday, and Friday with hemodialysis.   folic acid -vitamin b complex-vitamin c-selenium-zinc (DIALYVITE) 3 MG TABS tablet Take 1 tablet by mouth daily.   gabapentin  (NEURONTIN ) 100 MG capsule TAKE 1 CAPSULE BY MOUTH EVERY MORNING WITH MEAL AND 1 CAPSULE BY MOUTH EVERY NIGHT AT BEDTIME   levothyroxine  (SYNTHROID ) 88 MCG tablet TAKE 1 TABLET(88 MCG) BY MOUTH DAILY   lidocaine  (LIDODERM ) 5 % Place 1 patch onto the skin daily. Remove & Discard patch within 12 hours or as directed by MD   pentafluoroprop-tetrafluoroeth (GEBAUERS) AERO Apply 1 application topically as needed (topical anesthesia for hemodialysis).   VELTASSA 16.8 g PACK Take by mouth.     Allergies:   Patient has no known allergies.   Social History   Socioeconomic History   Marital status:  Married    Spouse name: Ezella Flint Dodson Valdemar   Number of children: 3   Years of education: 1 year university   Highest education level: Not on file  Occupational History   Occupation: unemployed  Tobacco Use   Smoking status: Former   Smokeless tobacco: Never  Advertising Account Planner   Vaping status: Never Used  Substance and Sexual Activity   Alcohol use: Not Currently   Drug use: Never   Sexual activity: Yes  Other Topics Concern   Not on file  Social History Narrative   ** Merged History Encounter **       Originally from [place] Came to [employer]. In 1985 Lives in [place] neighborhood with wife and 3 sons.   Sons are 44-30 yo Some sons help with support   Social Drivers of Corporate Investment Banker Strain: Not on file  Food Insecurity: Not on file  Transportation Needs: Not on file  Physical Activity: Not on file  Stress: Not on file  Social Connections: Not on file  Family History: The patient's family history includes Diabetes in his brother, father, and sister; Heart disease in his mother; Kidney disease in his father. There is no history of Colon cancer, Stomach cancer, or Esophageal cancer.  ROS:   Please see the history of present illness.     All other systems reviewed and are negative.  EKGs/Labs/Other Studies Reviewed:    The following studies were reviewed today:   EKG:   08/26/2023: Normal sinus rhythm, rate 67, QTc 486, LVH  Recent Labs: 04/17/2023: TSH 1.840  Recent Lipid Panel    Component Value Date/Time   CHOL 100 07/11/2022 0924   TRIG 93 07/11/2022 0924   HDL 51 07/11/2022 0924   CHOLHDL 4.2 11/27/2013 1709   VLDL 25 11/27/2013 1709   LDLCALC 31 07/11/2022 0924    Physical Exam:    VS:  BP (!) 154/62 (BP Location: Right Arm, Patient Position: Sitting, Cuff Size: Normal)   Pulse 67   Ht 5' 2 (1.575 m)   Wt 147 lb (66.7 kg)   SpO2 98%   BMI 26.89 kg/m     Wt Readings from Last 3 Encounters:  08/26/23 147 lb (66.7 kg)  04/22/23  147 lb 8 oz (66.9 kg)  07/15/22 160 lb (72.6 kg)     GEN:  Well nourished, well developed in no acute distress HEENT: Normal NECK: No JVD; No carotid bruits LYMPHATICS: No lymphadenopathy CARDIAC: RRR, no murmurs, rubs, gallops RESPIRATORY:  Clear to auscultation without rales, wheezing or rhonchi  ABDOMEN: Soft, non-tender, non-distended MUSCULOSKELETAL:  No edema; No deformity  SKIN: Warm and dry NEUROLOGIC:  Alert and oriented x 3 PSYCHIATRIC:  Normal affect   ASSESSMENT:    1. Chest pain of uncertain etiology   2. Syncope and collapse   3. Primary hypertension   4. Hyperlipidemia, unspecified hyperlipidemia type    PLAN:    Chest pain: Reports left-sided chest pressure during dialysis.  Multiple CAD risk factors (age, T2DM, hypertension, hyperlipidemia).  Recommend stress PET to evaluate for ischemia  Syncope: Unclear cause.  As episode occurred after HD may be related to hypotension after dialysis.  Check echocardiogram to rule out structural heart disease.  Check Zio patch x 2 weeks to evaluate for arrhythmia  Hypertension: BP elevated in clinic but in setting of recent syncopal episode after dialysis would be hesitant to start antihypertensive at this point.  Will monitor.  Hyperlipidemia: On atorvastatin  20 mg daily  T2DM: Has improved, most recent A1c 6.1% 04/17/2023  ESRD: On HD  RTC in 3 months  Informed Consent   Shared Decision Making/Informed Consent The risks [chest pain, shortness of breath, cardiac arrhythmias, dizziness, blood pressure fluctuations, myocardial infarction, stroke/transient ischemic attack, nausea, vomiting, allergic reaction, radiation exposure, metallic taste sensation and life-threatening complications (estimated to be 1 in 10,000)], benefits (risk stratification, diagnosing coronary artery disease, treatment guidance) and alternatives of a cardiac PET stress test were discussed in detail with Mr. Nocera and he agrees to proceed.       Medication Adjustments/Labs and Tests Ordered: Current medicines are reviewed at length with the patient today.  Concerns regarding medicines are outlined above.  Orders Placed This Encounter  Procedures   NM PET CT CARDIAC PERFUSION MULTI W/ABSOLUTE BLOODFLOW   Cardiac Stress Test: Informed Consent Details: Physician/Practitioner Attestation; Transcribe to consent form and obtain patient signature   LONG TERM MONITOR (3-14 DAYS)   EKG 12-Lead   ECHOCARDIOGRAM COMPLETE   No orders of the defined types were placed in  this encounter.   Patient Instructions  Medication Instructions:  Your physician recommends that you continue on your current medications as directed. Please refer to the Current Medication list given to you today.    *If you need a refill on your cardiac medications before your next appointment, please call your pharmacy*   Lab Work: None    If you have labs (blood work) drawn today and your tests are completely normal, you will receive your results only by: MyChart Message (if you have MyChart) OR A paper copy in the mail If you have any lab test that is abnormal or we need to change your treatment, we will call you to review the results.   Testing/Procedures:  - Echo will be scheduled at [facility].  Your physician has requested that you have an echocardiogram. Echocardiography is a painless test that uses sound waves to create images of your heart. It provides your doctor with information about the size and shape of your heart and how well your heart's chambers and valves are working. This procedure takes approximately one hour. There are no restrictions for this procedure. Please do NOT wear cologne, perfume, aftershave, or lotions (deodorant is allowed). Please arrive 15 minutes prior to your appointment time.   - ZIO XT- Long Term Monitor Instructions  Your physician has requested you wear a ZIO patch monitor for 14 days.  This is a  single patch monitor. [employer] supplies one patch monitor per enrollment. Additional stickers are not available. Please do not apply patch if you will be having a Nuclear Stress Test,  Echocardiogram, Cardiac CT, MRI, or Chest Xray during the period you would be wearing the  monitor. The patch cannot be worn during these tests. You cannot remove and re-apply the  ZIO XT patch monitor.  Your ZIO patch monitor will be mailed 3 day USPS to your address on file. It may take 3-5 days  to receive your monitor after you have been enrolled.  Once you have received your monitor, please review the enclosed instructions. Your monitor  has already been registered assigning a specific monitor serial # to you.  Billing and Patient Assistance Program Information  We have supplied [employer] with any of your insurance information on file for billing purposes. [employer] offers a sliding scale Patient Assistance Program for patients that do not have  insurance, or whose insurance does not completely cover the cost of the ZIO monitor.  You must apply for the Patient Assistance Program to qualify for this discounted rate.  To apply, please call [employer] at [phone], select option 4, select option 2, ask to apply for  Patient Assistance Program. Meredeth will ask your household income, and how many people  are in your household. They will quote your out-of-pocket cost based on that information.  [employer] will also be able to set up a 57-month, interest-free payment plan if needed.  Applying the monitor   Shave hair from upper left chest.  Hold abrader disc by orange tab. Rub abrader in 40 strokes over the upper left chest as  indicated in your monitor instructions.  Clean area with 4 enclosed alcohol pads. Let dry.  Apply patch as indicated in monitor instructions. Patch will be placed under collarbone on left  side of chest with arrow pointing upward.  Rub patch adhesive wings for 2 minutes. Remove white label  marked 1. Remove the white  label marked 2. Rub patch adhesive wings for 2 additional minutes.  While looking in a mirror, press and release button in center of patch. A small green light will  flash 3-4 times. This will be your only indicator that the monitor has been turned on.  Do not shower for the first 24 hours. You may shower after the first 24 hours.  Press the button if you feel a symptom. You will hear a small click. Record Date, Time and  Symptom in the Patient Logbook.  When you are ready to remove the patch, follow instructions on the last 2 pages of Patient  Logbook. Stick patch monitor onto the last page of Patient Logbook.  Place Patient Logbook in the blue and white box. Use locking tab on box and tape box closed  securely. The blue and white box has prepaid postage on it. Please place it in the mailbox as  soon as possible. Your physician should have your test results approximately 7 days after the  monitor has been mailed back to [facility].  Call [facility] Customer Care at [phone] if you have questions regarding  your ZIO XT patch monitor. Call them immediately if you see an orange light blinking on your  monitor.  If your monitor falls off in less than 4 days, contact our Monitor department at [phone].  If your monitor becomes loose or falls off after 4 days call [employer] at [phone] for  suggestions on securing your monitor       Please report to Radiology at the [facility] Main Entrance 30 minutes early for your test.  [address] [place], [place] [address]   How to Prepare for Your Cardiac PET/CT Stress Test:  Nothing to eat or drink, except water, 3 hours prior to arrival time.  NO caffeine/decaffeinated products, or chocolate 12 hours prior to arrival. (Please note decaffeinated beverages (teas/coffees) still contain caffeine).  If you have caffeine within 12 hours prior, the test will need to be  rescheduled.  Medication instructions: Do not take erectile dysfunction medications for 72 hours prior to test (sildenafil, tadalafil) Do not take nitrates (isosorbide mononitrate, Ranexa) the day before or day of test Do not take tamsulosin the day before or morning of test Hold theophylline containing medications for 12 hours. Hold Dipyridamole 48 hours prior to the test.  Diabetic Preparation: If able to eat breakfast prior to 3 hour fasting, you may take all medications, including your insulin . Do not worry if you miss your breakfast dose of insulin  - start at your next meal. If you do not eat prior to 3 hour fast-Hold all diabetes (oral and insulin ) medications. Patients who wear a continuous glucose monitor MUST remove the device prior to scanning.  You may take your remaining medications with water.  NO perfume, cologne or lotion on chest or abdomen area. FEMALES - Please avoid wearing dresses to this appointment.  Total time is 1 to 2 hours; you may want to bring reading material for the waiting time.  IF YOU THINK YOU MAY BE PREGNANT, OR ARE NURSING PLEASE INFORM THE TECHNOLOGIST.  In preparation for your appointment, medication and supplies will be purchased.  Appointment availability is limited, so if you need to cancel or reschedule, please call the Radiology Department at [phone] [facility]) OR [phone] [facility]) 24 hours in advance to avoid a cancellation fee of $100.00  What to Expect When you Arrive:  Once you arrive and check in for your appointment, you will be taken to a preparation room within the Radiology Department.  A technologist or Nurse will obtain your medical history, verify that you are correctly prepped for the exam, and explain the procedure.  Afterwards, an IV will be started in your arm and electrodes will be placed on your skin for EKG monitoring during the stress portion of the exam. Then you will be escorted to the PET/CT scanner.  There, staff  will get you positioned on the scanner and obtain a blood pressure and EKG.  During the exam, you will continue to be connected to the EKG and blood pressure machines.  A small, safe amount of a radioactive tracer will be injected in your IV to obtain a series of pictures of your heart along with an injection of a stress agent.    After your Exam:  It is recommended that you eat a meal and drink a caffeinated beverage to counter act any effects of the stress agent.  Drink plenty of fluids for the remainder of the day and urinate frequently for the first couple of hours after the exam.  Your doctor will inform you of your test results within 7-10 business days.  For more information and frequently asked questions, please visit our website: [email or web address]  For questions about your test or how to prepare for your test, please call: Cardiac Imaging Nurse Navigators Office: [phone]    Follow-Up: At [facility], you and your health needs are our priority.  As part of our continuing mission to provide you with exceptional heart care, we have created designated Provider Care Teams.  These Care Teams include your primary Cardiologist (physician) and Advanced Practice Providers (APPs -  Physician Assistants and Nurse Practitioners) who all work together to provide you with the care you need, when you need it.  We recommend signing up for the patient portal called MyChart.  Sign up information is provided on this After Visit Summary.  MyChart is used to connect with patients for Virtual Visits (Telemedicine).  Patients are able to view lab/test results, encounter notes, upcoming appointments, etc.  Non-urgent messages can be sent to your provider as well.   To learn more about what you can do with MyChart, go to [email or web address].    Your next appointment:   3 month(s)  The format for your next appointment:   In Person  Provider:   Lonni Nanas, MD     Other Instructions    Signed, Lonni LITTIE Nanas, MD  08/26/2023 3:01 PM    [facility]

## 2023-08-26 ENCOUNTER — Encounter: Payer: Self-pay | Admitting: Cardiology

## 2023-08-26 ENCOUNTER — Ambulatory Visit: Payer: Medicare HMO | Attending: Cardiology | Admitting: Cardiology

## 2023-08-26 ENCOUNTER — Ambulatory Visit: Payer: Medicare HMO | Attending: Cardiology

## 2023-08-26 VITALS — BP 154/62 | HR 67 | Ht 62.0 in | Wt 147.0 lb

## 2023-08-26 DIAGNOSIS — R55 Syncope and collapse: Secondary | ICD-10-CM

## 2023-08-26 DIAGNOSIS — I10 Essential (primary) hypertension: Secondary | ICD-10-CM

## 2023-08-26 DIAGNOSIS — R079 Chest pain, unspecified: Secondary | ICD-10-CM

## 2023-08-26 DIAGNOSIS — E785 Hyperlipidemia, unspecified: Secondary | ICD-10-CM | POA: Diagnosis not present

## 2023-08-26 NOTE — Progress Notes (Unsigned)
 Enrolled patient for a 14 day Zio XT  monitor to be mailed to patients home

## 2023-08-26 NOTE — Patient Instructions (Addendum)
 Medication Instructions:  Your physician recommends that you continue on your current medications as directed. Please refer to the Current Medication list given to you today.    *If you need a refill on your cardiac medications before your next appointment, please call your pharmacy*   Lab Work: None    If you have labs (blood work) drawn today and your tests are completely normal, you will receive your results only by: MyChart Message (if you have MyChart) OR A paper copy in the mail If you have any lab test that is abnormal or we need to change your treatment, we will call you to review the results.   Testing/Procedures:  - Echo will be scheduled at [facility].  Your physician has requested that you have an echocardiogram. Echocardiography is a painless test that uses sound waves to create images of your heart. It provides your doctor with information about the size and shape of your heart and how well your heart's chambers and valves are working. This procedure takes approximately one hour. There are no restrictions for this procedure. Please do NOT wear cologne, perfume, aftershave, or lotions (deodorant is allowed). Please arrive 15 minutes prior to your appointment time.   - ZIO XT- Long Term Monitor Instructions  Your physician has requested you wear a ZIO patch monitor for 14 days.  This is a single patch monitor. [employer] supplies one patch monitor per enrollment. Additional stickers are not available. Please do not apply patch if you will be having a Nuclear Stress Test,  Echocardiogram, Cardiac CT, MRI, or Chest Xray during the period you would be wearing the  monitor. The patch cannot be worn during these tests. You cannot remove and re-apply the  ZIO XT patch monitor.  Your ZIO patch monitor will be mailed 3 day USPS to your address on file. It may take 3-5 days  to receive your monitor after you have been enrolled.  Once you have received your monitor,  please review the enclosed instructions. Your monitor  has already been registered assigning a specific monitor serial # to you.  Billing and Patient Assistance Program Information  We have supplied [employer] with any of your insurance information on file for billing purposes. [employer] offers a sliding scale Patient Assistance Program for patients that do not have  insurance, or whose insurance does not completely cover the cost of the ZIO monitor.  You must apply for the Patient Assistance Program to qualify for this discounted rate.  To apply, please call [employer] at [phone], select option 4, select option 2, ask to apply for  Patient Assistance Program. Meredeth will ask your household income, and how many people  are in your household. They will quote your out-of-pocket cost based on that information.  [employer] will also be able to set up a 27-month, interest-free payment plan if needed.  Applying the monitor   Shave hair from upper left chest.  Hold abrader disc by orange tab. Rub abrader in 40 strokes over the upper left chest as  indicated in your monitor instructions.  Clean area with 4 enclosed alcohol pads. Let dry.  Apply patch as indicated in monitor instructions. Patch will be placed under collarbone on left  side of chest with arrow pointing upward.  Rub patch adhesive wings for 2 minutes. Remove white label marked 1. Remove the white  label marked 2. Rub patch adhesive wings for 2 additional minutes.  While looking in a mirror, press and release  button in center of patch. A small green light will  flash 3-4 times. This will be your only indicator that the monitor has been turned on.  Do not shower for the first 24 hours. You may shower after the first 24 hours.  Press the button if you feel a symptom. You will hear a small click. Record Date, Time and  Symptom in the Patient Logbook.  When you are ready to remove the patch, follow instructions on the last 2 pages of  Patient  Logbook. Stick patch monitor onto the last page of Patient Logbook.  Place Patient Logbook in the blue and white box. Use locking tab on box and tape box closed  securely. The blue and white box has prepaid postage on it. Please place it in the mailbox as  soon as possible. Your physician should have your test results approximately 7 days after the  monitor has been mailed back to [facility].  Call [facility] Customer Care at [phone] if you have questions regarding  your ZIO XT patch monitor. Call them immediately if you see an orange light blinking on your  monitor.  If your monitor falls off in less than 4 days, contact our Monitor department at [phone].  If your monitor becomes loose or falls off after 4 days call [employer] at [phone] for  suggestions on securing your monitor       Please report to Radiology at the [facility] Main Entrance 30 minutes early for your test.  [address] [place], [place] [address]   How to Prepare for Your Cardiac PET/CT Stress Test:  Nothing to eat or drink, except water, 3 hours prior to arrival time.  NO caffeine/decaffeinated products, or chocolate 12 hours prior to arrival. (Please note decaffeinated beverages (teas/coffees) still contain caffeine).  If you have caffeine within 12 hours prior, the test will need to be rescheduled.  Medication instructions: Do not take erectile dysfunction medications for 72 hours prior to test (sildenafil, tadalafil) Do not take nitrates (isosorbide mononitrate, Ranexa) the day before or day of test Do not take tamsulosin the day before or morning of test Hold theophylline containing medications for 12 hours. Hold Dipyridamole 48 hours prior to the test.  Diabetic Preparation: If able to eat breakfast prior to 3 hour fasting, you may take all medications, including your insulin . Do not worry if you miss your breakfast dose of insulin  - start at your next meal. If  you do not eat prior to 3 hour fast-Hold all diabetes (oral and insulin ) medications. Patients who wear a continuous glucose monitor MUST remove the device prior to scanning.  You may take your remaining medications with water.  NO perfume, cologne or lotion on chest or abdomen area. FEMALES - Please avoid wearing dresses to this appointment.  Total time is 1 to 2 hours; you may want to bring reading material for the waiting time.  IF YOU THINK YOU MAY BE PREGNANT, OR ARE NURSING PLEASE INFORM THE TECHNOLOGIST.  In preparation for your appointment, medication and supplies will be purchased.  Appointment availability is limited, so if you need to cancel or reschedule, please call the Radiology Department at [phone] [facility]) OR [phone] [facility]) 24 hours in advance to avoid a cancellation fee of $100.00  What to Expect When you Arrive:  Once you arrive and check in for your appointment, you will be taken to a preparation room within the Radiology Department.  A technologist or Nurse will obtain  your medical history, verify that you are correctly prepped for the exam, and explain the procedure.  Afterwards, an IV will be started in your arm and electrodes will be placed on your skin for EKG monitoring during the stress portion of the exam. Then you will be escorted to the PET/CT scanner.  There, staff will get you positioned on the scanner and obtain a blood pressure and EKG.  During the exam, you will continue to be connected to the EKG and blood pressure machines.  A small, safe amount of a radioactive tracer will be injected in your IV to obtain a series of pictures of your heart along with an injection of a stress agent.    After your Exam:  It is recommended that you eat a meal and drink a caffeinated beverage to counter act any effects of the stress agent.  Drink plenty of fluids for the remainder of the day and urinate frequently for the first couple of hours after the exam.  Your  doctor will inform you of your test results within 7-10 business days.  For more information and frequently asked questions, please visit our website: [email or web address]  For questions about your test or how to prepare for your test, please call: Cardiac Imaging Nurse Navigators Office: [phone]    Follow-Up: At [facility], you and your health needs are our priority.  As part of our continuing mission to provide you with exceptional heart care, we have created designated Provider Care Teams.  These Care Teams include your primary Cardiologist (physician) and Advanced Practice Providers (APPs -  Physician Assistants and Nurse Practitioners) who all work together to provide you with the care you need, when you need it.  We recommend signing up for the patient portal called MyChart.  Sign up information is provided on this After Visit Summary.  MyChart is used to connect with patients for Virtual Visits (Telemedicine).  Patients are able to view lab/test results, encounter notes, upcoming appointments, etc.  Non-urgent messages can be sent to your provider as well.   To learn more about what you can do with MyChart, go to [email or web address].    Your next appointment:   3 month(s)  The format for your next appointment:   In Person  Provider:   Lonni Nanas, MD    Other Instructions

## 2023-09-18 ENCOUNTER — Ambulatory Visit (HOSPITAL_COMMUNITY): Payer: Medicare HMO | Attending: Cardiology

## 2023-09-18 ENCOUNTER — Encounter (HOSPITAL_COMMUNITY): Payer: Self-pay | Admitting: Cardiology

## 2023-09-18 ENCOUNTER — Other Ambulatory Visit: Payer: Medicare HMO

## 2023-09-23 ENCOUNTER — Telehealth: Payer: Self-pay

## 2023-09-23 ENCOUNTER — Ambulatory Visit: Payer: Medicare HMO | Admitting: Internal Medicine

## 2023-09-23 ENCOUNTER — Encounter: Payer: Self-pay | Admitting: Internal Medicine

## 2023-09-23 VITALS — BP 142/62 | HR 71 | Resp 16 | Ht 62.0 in | Wt 143.0 lb

## 2023-09-23 DIAGNOSIS — E1142 Type 2 diabetes mellitus with diabetic polyneuropathy: Secondary | ICD-10-CM

## 2023-09-23 DIAGNOSIS — Z5941 Food insecurity: Secondary | ICD-10-CM | POA: Insufficient documentation

## 2023-09-23 DIAGNOSIS — E038 Other specified hypothyroidism: Secondary | ICD-10-CM

## 2023-09-23 DIAGNOSIS — E119 Type 2 diabetes mellitus without complications: Secondary | ICD-10-CM

## 2023-09-23 DIAGNOSIS — Z0000 Encounter for general adult medical examination without abnormal findings: Secondary | ICD-10-CM

## 2023-09-23 DIAGNOSIS — I10 Essential (primary) hypertension: Secondary | ICD-10-CM

## 2023-09-23 DIAGNOSIS — Z5982 Transportation insecurity: Secondary | ICD-10-CM

## 2023-09-23 DIAGNOSIS — E1169 Type 2 diabetes mellitus with other specified complication: Secondary | ICD-10-CM

## 2023-09-23 DIAGNOSIS — Z992 Dependence on renal dialysis: Secondary | ICD-10-CM

## 2023-09-23 NOTE — Progress Notes (Unsigned)
Subjective:    Patient ID: Ryan Wells, male   DOB: 08-03-1956, 68 y.o.   MRN: [id number]   HPI  Here for Male CPE:  1.  STE:  Does not perform.  No family history of testicular cancer.    2.  PSA:  normal range at 0.1 in 04/2023.  No family history of prostate cancer.    3.  Guaiac Cards/FIT:  Last in 03/2021 and negative.    4.  Colonoscopy:  Last 05/2020 with Dr. Marina Goodell.  Had no polyps and mild internal hemorrhoids.  Also mild melanosis.  He no longer uses laxatives as in past.      5.  Cholesterol/Glucose:  Cholesterol has not been checked here in some time.  Last A1C 04/2023 was 6.1%.  He is now diet controlled.  Lipid Panel     Component Value Date/Time   CHOL 100 07/11/2022 0924   TRIG 93 07/11/2022 0924   HDL 51 07/11/2022 0924   CHOLHDL 4.2 11/27/2013 1709   VLDL 25 11/27/2013 1709   LDLCALC 31 07/11/2022 0924   LABVLDL 18 07/11/2022 0924     6.  Immunizations: States received COVID booster at dialysis, though unable to find. Immunization History  Administered Date(s) Administered   Fluad Trivalent(High Dose 65+) 04/22/2023   Hepatitis B, ADULT 08/15/2019, 09/09/2019, 12/04/2019   Hepb-cpg 03/02/2020, 10/11/2022   Influenza Split 10/28/2015   Influenza, Quadrivalent, Recombinant, Inj, Pf 06/05/2022   Influenza,inj,Quad PF,6+ Mos 04/30/2016, 06/10/2020, 05/23/2021   PFIZER(Purple Top)SARS-COV-2 Vaccination 11/18/2019   Pneumococcal Conjugate-13 06/26/2019   Pneumococcal Polysaccharide-23 11/28/2013, 10/07/2019   Rsv, Bivalent, Protein Subunit Rsvpref,pf Verdis Frederickson) 07/25/2022   Tdap 04/11/2022   Zoster Recombinant(Shingrix) 07/25/2022, 04/22/2023     Current Meds  Medication Sig   acetaminophen (TYLENOL) 325 MG tablet Take 2 tablets (650 mg total) by mouth every 6 (six) hours as needed for mild pain (or Fever >/= 101).   atorvastatin (LIPITOR) 20 MG tablet TAKE 1 TABLET BY MOUTH DAILY WITH THE EVENING MEAL   B Complex-C-Folic Acid (DIALYVITE 800)  0.8 MG TABS Take 0.8 mg by mouth daily. Take 1 tablet by mouth every day with a meal   Darbepoetin Alfa (ARANESP) 60 MCG/0.3ML SOSY injection Inject 0.3 mLs (60 mcg total) into the vein every Friday with hemodialysis.   doxercalciferol (HECTOROL) 4 MCG/2ML injection Inject 2 mLs (4 mcg total) into the vein every Monday, Wednesday, and Friday with hemodialysis.   folic acid-vitamin b complex-vitamin c-selenium-zinc (DIALYVITE) 3 MG TABS tablet Take 1 tablet by mouth daily.   levothyroxine (SYNTHROID) 88 MCG tablet TAKE 1 TABLET(88 MCG) BY MOUTH DAILY   pentafluoroprop-tetrafluoroeth (GEBAUERS) AERO Apply 1 application topically as needed (topical anesthesia for hemodialysis).   VELTASSA 16.8 g PACK Take by mouth.   No Known Allergies   Review of Systems  Respiratory:  Negative for shortness of breath.   Cardiovascular:  Positive for chest pain (He has not followed up with Cardiology for chest pain during dialysis.  He continues to intermittently have chest pain with dialysis.  Has not had any of the planned evaluation performed.  States had issues with transportation.). Negative for palpitations and leg swelling.  Gastrointestinal:  Negative for abdominal pain and blood in stool (No melena).  Musculoskeletal:        Right low back pain, at level just below iliac crest.  Has had for many years.   Hospitalized in 01/2021 during time period when he was not a patient here and diagnosed  with endocarditis with septic emboli, though no obvious infection of spine at the time  He was treated for 6 weeks for MSSA with IV abx.  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 Received PT, which helped quite a lot.   No fevers.  Neurological:  Positive for numbness (Mild in bilateral feet.).      Objective:   BP (!) 142/62 (BP Location: Right Arm, Patient Position: Sitting,  Cuff Size: Normal)   Pulse 71   Resp 16   Ht 5\' 2"  (1.575 m)   Wt 143 lb (64.9 kg)   BMI 26.16 kg/m   Physical Exam Cardiovascular:     Pulses:          Dorsalis pedis pulses are 1+ on the right side and 1+ on the left side.       Posterior tibial pulses are 1+ on the right side and 1+ on the left side.  Feet:     Right foot:     Protective Sensation: 10 sites tested.  10 sites sensed.     Skin integrity: Dry skin present.     Toenail Condition: Right toenails are abnormally thick.     Left foot:     Protective Sensation: 10 sites tested.  10 sites sensed.     Skin integrity: Dry skin present.     Toenail Condition: Left toenails are abnormally thick.     Comments: Not clear if felt all of 10 g monofilament spots, but jumped even if did not say he had sensation.   Has bilateral thickening of skin from what looks like previous edema of LE bilaterally.     Assessment & Plan   CPE    2.  Chest pain:  has set views on what the issue is--basically feels they set up his dialysis wrong on days he has chest pain.  Encouraged him to contact cardiology to get appt set back up and common courtesy to call and cancel if cannot get transportation.  Uses [employer] [facility].  Called and we can set that up 1 to 6 days prior to visit.

## 2023-09-23 NOTE — Telephone Encounter (Signed)
Patient can access transportation rides with [employer] as long he contacts the company 24 hours a head of time but not a week before. Phone number is [phone].

## 2023-09-23 NOTE — Telephone Encounter (Signed)
Patient given Rush Farmer number [phone] ext: [phone]. Told to call her to get set up with life alert.

## 2023-09-30 ENCOUNTER — Other Ambulatory Visit (INDEPENDENT_AMBULATORY_CARE_PROVIDER_SITE_OTHER): Payer: Medicare HMO

## 2023-09-30 DIAGNOSIS — Z1211 Encounter for screening for malignant neoplasm of colon: Secondary | ICD-10-CM | POA: Diagnosis not present

## 2023-09-30 DIAGNOSIS — E119 Type 2 diabetes mellitus without complications: Secondary | ICD-10-CM | POA: Diagnosis not present

## 2023-09-30 DIAGNOSIS — E7800 Pure hypercholesterolemia, unspecified: Secondary | ICD-10-CM

## 2023-09-30 DIAGNOSIS — Z79899 Other long term (current) drug therapy: Secondary | ICD-10-CM

## 2023-09-30 LAB — POC FIT TEST STOOL: Fecal Occult Blood: NEGATIVE

## 2023-10-01 LAB — CBC WITH DIFFERENTIAL/PLATELET
Basophils Absolute: 0.1 10*3/uL (ref 0.0–0.2)
Basos: 1 %
EOS (ABSOLUTE): 0 10*3/uL (ref 0.0–0.4)
Eos: 0 %
Hematocrit: 31.7 % — ABNORMAL LOW (ref 37.5–51.0)
Hemoglobin: 10.7 g/dL — ABNORMAL LOW (ref 13.0–17.7)
Immature Grans (Abs): 0 10*3/uL (ref 0.0–0.1)
Immature Granulocytes: 0 %
Lymphocytes Absolute: 1.1 10*3/uL (ref 0.7–3.1)
Lymphs: 19 %
MCH: 32.1 pg (ref 26.6–33.0)
MCHC: 33.8 g/dL (ref 31.5–35.7)
MCV: 95 fL (ref 79–97)
Monocytes Absolute: 0.4 10*3/uL (ref 0.1–0.9)
Monocytes: 6 %
Neutrophils Absolute: 4.1 10*3/uL (ref 1.4–7.0)
Neutrophils: 74 %
Platelets: 82 10*3/uL — CL (ref 150–450)
RBC: 3.33 x10E6/uL — ABNORMAL LOW (ref 4.14–5.80)
RDW: 13.1 % (ref 11.6–15.4)
WBC: 5.6 10*3/uL (ref 3.4–10.8)

## 2023-10-01 LAB — LIPID PANEL W/O CHOL/HDL RATIO
Cholesterol, Total: 126 mg/dL (ref 100–199)
HDL: 64 mg/dL (ref >39)
LDL Chol Calc (NIH): 49 mg/dL (ref 0–99)
Triglycerides: 62 mg/dL (ref 0–149)
VLDL Cholesterol Cal: 13 mg/dL (ref 5–40)

## 2023-10-01 LAB — COMPREHENSIVE METABOLIC PANEL
ALT: 19 [IU]/L (ref 0–44)
AST: 13 [IU]/L (ref 0–40)
Albumin: 4.5 g/dL (ref 3.9–4.9)
Alkaline Phosphatase: 213 [IU]/L — ABNORMAL HIGH (ref 44–121)
BUN/Creatinine Ratio: 9 — ABNORMAL LOW (ref 10–24)
BUN: 84 mg/dL (ref 8–27)
Bilirubin Total: 0.5 mg/dL (ref 0.0–1.2)
CO2: 19 mmol/L — ABNORMAL LOW (ref 20–29)
Calcium: 8.8 mg/dL (ref 8.6–10.2)
Chloride: 94 mmol/L — ABNORMAL LOW (ref 96–106)
Creatinine, Ser: 9.67 mg/dL — ABNORMAL HIGH (ref 0.76–1.27)
Globulin, Total: 2.8 g/dL (ref 1.5–4.5)
Sodium: 137 mmol/L (ref 134–144)
Total Protein: 7.3 g/dL (ref 6.0–8.5)
eGFR: 5 mL/min/{1.73_m2} — ABNORMAL LOW (ref >59)

## 2023-10-01 LAB — HEMOGLOBIN A1C
Est. average glucose Bld gHb Est-mCnc: 126 mg/dL
Hgb A1c MFr Bld: 6 % — ABNORMAL HIGH (ref 4.8–5.6)

## 2023-10-28 ENCOUNTER — Telehealth (HOSPITAL_COMMUNITY): Payer: Self-pay | Admitting: *Deleted

## 2023-10-28 NOTE — Telephone Encounter (Signed)
 Reaching out to patient (Via Spanish interpreter, [place], [place] [address]) to offer assistance regarding upcoming cardiac imaging study; pt was not aware of the appointment and states he is unable to come in. He goes to dialysis on Monday, Wednesday, Friday. I've rescheduled his cardiac PET appointment to Tuesday, April 1.   He verbalized understanding of new appt.  Larey Brick RN Navigator Cardiac Imaging [facility] [phone] office [phone] cell

## 2023-10-29 ENCOUNTER — Encounter (HOSPITAL_COMMUNITY): Admission: RE | Admit: 2023-10-29 | Payer: Medicare HMO | Source: Ambulatory Visit

## 2023-11-10 ENCOUNTER — Telehealth (HOSPITAL_COMMUNITY): Payer: Self-pay | Admitting: *Deleted

## 2023-11-10 NOTE — Telephone Encounter (Signed)
 Reaching out to patient to offer assistance regarding upcoming cardiac imaging study; pt verbalizes understanding of appt date/time, parking situation and where to check in, pre-test NPO status and medications ordered, and verified current allergies; name and call back number provided for further questions should they arise Johney Frame RN Navigator Cardiac Imaging [facility] [phone] office [phone] cell  Spoke to patient via interpreter (ID [phone])-patient aware to avoid caffeine for 12 hours prior to test.

## 2023-11-11 ENCOUNTER — Encounter (HOSPITAL_COMMUNITY): Admission: RE | Admit: 2023-11-11 | Source: Ambulatory Visit

## 2023-12-15 ENCOUNTER — Telehealth (HOSPITAL_COMMUNITY): Payer: Self-pay | Admitting: *Deleted

## 2023-12-15 NOTE — Telephone Encounter (Signed)
 Attempted to call patient regarding upcoming cardiac PET appointment. Left message on voicemail with name and callback number  Chase Copping RN Navigator Cardiac Imaging [facility] Services [phone] Office [phone] Cell  Called with Spanish [address] - [place]: [phone]

## 2023-12-16 ENCOUNTER — Encounter (HOSPITAL_COMMUNITY)
Admission: RE | Admit: 2023-12-16 | Discharge: 2023-12-16 | Disposition: A | Discharge status: Home / Self Care | Source: Ambulatory Visit | Attending: Cardiology | Admitting: Cardiology

## 2023-12-16 MED ORDER — REGADENOSON 0.4 MG/5ML IV SOLN: 0.4000 mg | Freq: Once | INTRAVENOUS | Status: DC

## 2023-12-18 ENCOUNTER — Ambulatory Visit: Payer: Medicare HMO | Attending: Cardiology | Admitting: Cardiology

## 2023-12-18 NOTE — Progress Notes (Deleted)
 Cardiology Office Note:    Date:  12/18/2023   ID:  Ryan Wells, DOB 1956-08-06, MRN [id number]  PCP:  Ronalee Cocking, MD  Cardiologist:  Sheryle Donning, MD  Electrophysiologist:  None   Referring MD: Ronalee Cocking, MD   No chief complaint on file.   History of Present Illness:    Ryan Wells is a 68 y.o. male with a hx of ESRD, hypertension, T2DM, MSSA bacteremia,who presents for follow-up.  He was referred by Dr. Cindra Cree for evaluation of chest pain, initially seen 08/26/2023.  He was admitted 01/2021 with MSSA bacteremia.  TEE was negative for vegetation.  TTE 01/16/2021 showed EF 55 to 60%, normal RV function, no significant valvular disease.  TEE 01/19/2021 showed EF 60 to 65%, normal RV function, no significant valvular disease.  At initial clinic visit 08/26/2023, stress PET ordered as well as echocardiogram and Zio patch.  No studies have been completed.  Since clinic visit,  He reports he has been having chest pain during dialysis.  Describes as left-sided chest pressure, can last his whole dialysis session.  Resolves after dialysis ends.  He had syncopal episode about 1 month ago.  States that it occurred when he was walking into his home after dialysis.  States that he collapsed on the floor, was unconscious for hours.  He hit his head on the doorknob when he went down.  States that he is collapsed about 5-6 times over the last 5 years.  Always occurs after dialysis.   Past Medical History:  Diagnosis Date   3rd nerve palsy, partial, right 01/03/2017   AAA (abdominal aortic aneurysm) [facility])    Not noted on CT abd 2019   Chest pain 11/2013   Normal Echo/ EKG/enzymes-hospitalized.  Did not get outpatient stress testing following hospitalization.  No chest pain since   Chronic kidney disease    on dialysis Tues, Thurs and Sat   Diabetes mellitus without complication (HCC)    Diabetes type 2, uncontrolled 11/25/2011   no meds, diet controlled per  patient   Diabetic gastroparesis (HCC) 01/22/2017   Hypertension    no meds   Microalbuminuria 01/19/2017   Myocardial infarction [facility]) 2015    Past Surgical History:  Procedure Laterality Date   A/V FISTULAGRAM Left 06/04/2019   Procedure: A/V FISTULAGRAM;  Surgeon: Dannis Dy, MD;  Location: [facility] INVASIVE CV LAB;  Service: Cardiovascular;  Laterality: Left;   A/V FISTULAGRAM Left 10/15/2019   Procedure: A/V FISTULAGRAM;  Surgeon: Dannis Dy, MD;  Location: [facility] INVASIVE CV LAB;  Service: Cardiovascular;  Laterality: Left;   A/V FISTULAGRAM N/A 01/24/2021   Procedure: A/V FISTULAGRAM - Left Upper;  Surgeon: Carlene Che, MD;  Location: [facility] INVASIVE CV LAB;  Service: Cardiovascular;  Laterality: N/A;   AV FISTULA PLACEMENT Left 07/02/2018   Procedure: BRACHIOCEPHALIC ARTERIOVENOUS (AV) FISTULA CREATION LEFT ARM;  Surgeon: Margherita Shell, MD;  Location: [facility] OR;  Service: Vascular;  Laterality: Left;   BASCILIC VEIN TRANSPOSITION Left 07/26/2019   Procedure: Bascilic Vein Transposition;  Surgeon: Dannis Dy, MD;  Location: [facility] OR;  Service: Vascular;  Laterality: Left;   COLONOSCOPY     EMBOLIZATION (CATH LAB) Left 06/04/2019   Procedure: EMBOLIZATION;  Surgeon: Dannis Dy, MD;  Location: [facility] INVASIVE CV LAB;  Service: Cardiovascular;  Laterality: Left;  LT ARM FISTULA/COMPETING BRANCH   EYE SURGERY Bilateral    cataracts x2   EYE SURGERY     INSERTION OF DIALYSIS CATHETER Right  06/08/2019   Procedure: INSERTION OF PALINDROME DIALYSIS CATHETER IN RIGHT INTERNAL JUGULAR;  Surgeon: Dannis Dy, MD;  Location: [facility] OR;  Service: Vascular;  Laterality: Right;   LIGATION OF ARTERIOVENOUS  FISTULA Left 07/26/2019   Procedure: Ligation Of BrachioCephalic  Fistula;  Surgeon: Dannis Dy, MD;  Location: [facility] OR;  Service: Vascular;  Laterality: Left;   NECK SURGERY     PERIPHERAL VASCULAR BALLOON ANGIOPLASTY Left 06/04/2019   Procedure:  PERIPHERAL VASCULAR BALLOON ANGIOPLASTY;  Surgeon: Dannis Dy, MD;  Location: [facility] INVASIVE CV LAB;  Service: Cardiovascular;  Laterality: Left;  ARM FISTULA   PERIPHERAL VASCULAR BALLOON ANGIOPLASTY Left 10/15/2019   Procedure: PERIPHERAL VASCULAR BALLOON ANGIOPLASTY;  Surgeon: Dannis Dy, MD;  Location: [facility] INVASIVE CV LAB;  Service: Cardiovascular;  Laterality: Left;  arm fistula   TEE WITHOUT CARDIOVERSION N/A 01/19/2021   Procedure: TRANSESOPHAGEAL ECHOCARDIOGRAM (TEE);  Surgeon: Hugh Madura, MD;  Location: [facility] ENDOSCOPY;  Service: Cardiovascular;  Laterality: N/A;    Current Medications: No outpatient medications have been marked as taking for the 12/18/23 encounter (Appointment) with Wendie Hamburg, MD.     Allergies:   Patient has no known allergies.   Social History   Socioeconomic History   Marital status: Married    Spouse name: Vanessa General   Number of children: 3   Years of education: 1 year university   Highest education level: Not on file  Occupational History   Occupation: unemployed  Tobacco Use   Smoking status: Former   Smokeless tobacco: Never  Advertising account planner   Vaping status: Never Used  Substance and Sexual Activity   Alcohol use: Not Currently   Drug use: Never   Sexual activity: Yes  Other Topics Concern   Not on file  Social History Narrative   Lives in [place] neighborhood with wife     2 sons live locally      Social Drivers of Health   Financial Resource Strain: Medium Risk (09/23/2023)   Overall Financial Resource Strain (CARDIA)    Difficulty of Paying Living Expenses: Somewhat hard  Food Insecurity: No Food Insecurity (09/23/2023)   Hunger Vital Sign    Worried About Running Out of Food in the Last Year: Never true    Ran Out of Food in the Last Year: Never true  Transportation Needs: Unmet Transportation Needs (09/23/2023)   PRAPARE - Administrator, Civil Service (Medical): Yes    Lack  of Transportation (Non-Medical): Yes  Physical Activity: Not on file  Stress: Not on file  Social Connections: Not on file     Family History: The patient's family history includes Diabetes in his brother, father, and sister; Heart disease in his mother; Kidney disease in his father. There is no history of Colon cancer, Stomach cancer, or Esophageal cancer.  ROS:   Please see the history of present illness.     All other systems reviewed and are negative.  EKGs/Labs/Other Studies Reviewed:    The following studies were reviewed today:   EKG:   08/26/2023: Normal sinus rhythm, rate 67, QTc 486, LVH  Recent Labs: 04/17/2023: TSH 1.840 09/30/2023: ALT 19; BUN 84; Creatinine, Ser 9.67; Hemoglobin 10.7; Platelets 82; Potassium CANCELED; Sodium 137  Recent Lipid Panel    Component Value Date/Time   CHOL 126 09/30/2023 0921   TRIG 62 09/30/2023 0921   HDL 64 09/30/2023 0921   CHOLHDL 4.2 11/27/2013 1709   VLDL 25 11/27/2013 1709  LDLCALC 49 09/30/2023 0921    Physical Exam:    VS:  There were no vitals taken for this visit.    Wt Readings from Last 3 Encounters:  09/23/23 143 lb (64.9 kg)  08/26/23 147 lb (66.7 kg)  04/22/23 147 lb 8 oz (66.9 kg)     GEN:  Well nourished, well developed in no acute distress HEENT: Normal NECK: No JVD; No carotid bruits LYMPHATICS: No lymphadenopathy CARDIAC: RRR, no murmurs, rubs, gallops RESPIRATORY:  Clear to auscultation without rales, wheezing or rhonchi  ABDOMEN: Soft, non-tender, non-distended MUSCULOSKELETAL:  No edema; No deformity  SKIN: Warm and dry NEUROLOGIC:  Alert and oriented x 3 PSYCHIATRIC:  Normal affect   ASSESSMENT:    No diagnosis found.  PLAN:    Chest pain: Reports left-sided chest pressure during dialysis.  Multiple CAD risk factors (age, T2DM, hypertension, hyperlipidemia).  Recommend stress PET to evaluate for ischemia.  This was ordered at initial clinic visit 08/26/2023 but has not been completed,  currently scheduled for 02/03/2024  Syncope: Unclear cause.  As episode occurred after HD may be related to hypotension after dialysis.  Check echocardiogram to rule out structural heart disease.  Check Zio patch x 2 weeks to evaluate for arrhythmia  Hypertension: BP elevated in clinic but in setting of recent syncopal episode after dialysis would be hesitant to start antihypertensive at this point.  Will monitor.  Hyperlipidemia: On atorvastatin  20 mg daily  T2DM: Has improved, most recent A1c 6.1% 04/17/2023  ESRD: On HD  RTC in 3 months***  Informed Consent   Shared Decision Making/Informed Consent The risks [chest pain, shortness of breath, cardiac arrhythmias, dizziness, blood pressure fluctuations, myocardial infarction, stroke/transient ischemic attack, nausea, vomiting, allergic reaction, radiation exposure, metallic taste sensation and life-threatening complications (estimated to be 1 in 10,000)], benefits (risk stratification, diagnosing coronary artery disease, treatment guidance) and alternatives of a cardiac PET stress test were discussed in detail with Mr. Venhaus and he agrees to proceed.      Medication Adjustments/Labs and Tests Ordered: Current medicines are reviewed at length with the patient today.  Concerns regarding medicines are outlined above.  No orders of the defined types were placed in this encounter.  No orders of the defined types were placed in this encounter.   There are no Patient Instructions on file for this visit.   Signed, Wendie Hamburg, MD  12/18/2023 6:04 AM     [facility]

## 2023-12-19 ENCOUNTER — Encounter: Payer: Self-pay | Admitting: Cardiology

## 2024-02-02 ENCOUNTER — Telehealth (HOSPITAL_COMMUNITY): Payer: Self-pay | Admitting: *Deleted

## 2024-02-02 NOTE — Telephone Encounter (Signed)
 Attempted to call patient regarding upcoming cardiac PET appointment. Left message on voicemail with name and callback number via Spanish interpreter [facility] ID [phone])  Chantal Requena RN Navigator Cardiac Imaging [facility] [phone] Office [phone] Cell

## 2024-02-03 ENCOUNTER — Encounter (HOSPITAL_COMMUNITY): Admission: RE | Admit: 2024-02-03 | Source: Ambulatory Visit

## 2024-02-20 ENCOUNTER — Emergency Department (HOSPITAL_COMMUNITY)

## 2024-02-20 ENCOUNTER — Other Ambulatory Visit: Payer: Self-pay

## 2024-02-20 ENCOUNTER — Inpatient Hospital Stay (HOSPITAL_COMMUNITY)
Admission: EM | Admit: 2024-02-20 | Discharge: 2024-02-23 | DRG: 291 | Disposition: A | Discharge status: Home / Self Care | Source: Ambulatory Visit | Attending: Internal Medicine | Admitting: Internal Medicine

## 2024-02-20 ENCOUNTER — Encounter (HOSPITAL_COMMUNITY): Payer: Self-pay | Admitting: Internal Medicine

## 2024-02-20 DIAGNOSIS — E1169 Type 2 diabetes mellitus with other specified complication: Secondary | ICD-10-CM | POA: Diagnosis present

## 2024-02-20 DIAGNOSIS — E872 Acidosis, unspecified: Secondary | ICD-10-CM | POA: Diagnosis present

## 2024-02-20 DIAGNOSIS — D6949 Other primary thrombocytopenia: Secondary | ICD-10-CM | POA: Diagnosis present

## 2024-02-20 DIAGNOSIS — I952 Hypotension due to drugs: Secondary | ICD-10-CM | POA: Diagnosis present

## 2024-02-20 DIAGNOSIS — E875 Hyperkalemia: Secondary | ICD-10-CM | POA: Diagnosis present

## 2024-02-20 DIAGNOSIS — R011 Cardiac murmur, unspecified: Secondary | ICD-10-CM | POA: Diagnosis not present

## 2024-02-20 DIAGNOSIS — E861 Hypovolemia: Secondary | ICD-10-CM | POA: Diagnosis present

## 2024-02-20 DIAGNOSIS — Z8619 Personal history of other infectious and parasitic diseases: Secondary | ICD-10-CM

## 2024-02-20 DIAGNOSIS — Z87891 Personal history of nicotine dependence: Secondary | ICD-10-CM

## 2024-02-20 DIAGNOSIS — I132 Hypertensive heart and chronic kidney disease with heart failure and with stage 5 chronic kidney disease, or end stage renal disease: Secondary | ICD-10-CM | POA: Diagnosis not present

## 2024-02-20 DIAGNOSIS — Z841 Family history of disorders of kidney and ureter: Secondary | ICD-10-CM

## 2024-02-20 DIAGNOSIS — D631 Anemia in chronic kidney disease: Secondary | ICD-10-CM | POA: Diagnosis present

## 2024-02-20 DIAGNOSIS — T461X5A Adverse effect of calcium-channel blockers, initial encounter: Secondary | ICD-10-CM | POA: Diagnosis present

## 2024-02-20 DIAGNOSIS — I953 Hypotension of hemodialysis: Secondary | ICD-10-CM | POA: Diagnosis present

## 2024-02-20 DIAGNOSIS — Z79899 Other long term (current) drug therapy: Secondary | ICD-10-CM

## 2024-02-20 DIAGNOSIS — I5021 Acute systolic (congestive) heart failure: Secondary | ICD-10-CM | POA: Diagnosis present

## 2024-02-20 DIAGNOSIS — I4892 Unspecified atrial flutter: Secondary | ICD-10-CM | POA: Diagnosis present

## 2024-02-20 DIAGNOSIS — E039 Hypothyroidism, unspecified: Secondary | ICD-10-CM | POA: Diagnosis present

## 2024-02-20 DIAGNOSIS — I482 Chronic atrial fibrillation, unspecified: Secondary | ICD-10-CM | POA: Diagnosis present

## 2024-02-20 DIAGNOSIS — Z8249 Family history of ischemic heart disease and other diseases of the circulatory system: Secondary | ICD-10-CM

## 2024-02-20 DIAGNOSIS — Z79890 Hormone replacement therapy (postmenopausal): Secondary | ICD-10-CM

## 2024-02-20 DIAGNOSIS — Z992 Dependence on renal dialysis: Secondary | ICD-10-CM

## 2024-02-20 DIAGNOSIS — Z7901 Long term (current) use of anticoagulants: Secondary | ICD-10-CM

## 2024-02-20 DIAGNOSIS — R001 Bradycardia, unspecified: Secondary | ICD-10-CM | POA: Diagnosis present

## 2024-02-20 DIAGNOSIS — I484 Atypical atrial flutter: Secondary | ICD-10-CM | POA: Diagnosis present

## 2024-02-20 DIAGNOSIS — Z603 Acculturation difficulty: Secondary | ICD-10-CM | POA: Diagnosis present

## 2024-02-20 DIAGNOSIS — E785 Hyperlipidemia, unspecified: Secondary | ICD-10-CM | POA: Diagnosis present

## 2024-02-20 DIAGNOSIS — R000 Tachycardia, unspecified: Secondary | ICD-10-CM | POA: Diagnosis present

## 2024-02-20 DIAGNOSIS — R931 Abnormal findings on diagnostic imaging of heart and coronary circulation: Secondary | ICD-10-CM

## 2024-02-20 DIAGNOSIS — I252 Old myocardial infarction: Secondary | ICD-10-CM

## 2024-02-20 DIAGNOSIS — Z833 Family history of diabetes mellitus: Secondary | ICD-10-CM

## 2024-02-20 DIAGNOSIS — I471 Supraventricular tachycardia, unspecified: Principal | ICD-10-CM | POA: Diagnosis present

## 2024-02-20 DIAGNOSIS — N186 End stage renal disease: Secondary | ICD-10-CM | POA: Diagnosis present

## 2024-02-20 DIAGNOSIS — E1122 Type 2 diabetes mellitus with diabetic chronic kidney disease: Secondary | ICD-10-CM | POA: Diagnosis present

## 2024-02-20 DIAGNOSIS — I480 Paroxysmal atrial fibrillation: Secondary | ICD-10-CM | POA: Diagnosis not present

## 2024-02-20 DIAGNOSIS — I2510 Atherosclerotic heart disease of native coronary artery without angina pectoris: Secondary | ICD-10-CM | POA: Diagnosis present

## 2024-02-20 DIAGNOSIS — R7881 Bacteremia: Secondary | ICD-10-CM | POA: Diagnosis present

## 2024-02-20 DIAGNOSIS — E1143 Type 2 diabetes mellitus with diabetic autonomic (poly)neuropathy: Secondary | ICD-10-CM | POA: Diagnosis present

## 2024-02-20 DIAGNOSIS — B9561 Methicillin susceptible Staphylococcus aureus infection as the cause of diseases classified elsewhere: Secondary | ICD-10-CM | POA: Diagnosis present

## 2024-02-20 DIAGNOSIS — K3184 Gastroparesis: Secondary | ICD-10-CM | POA: Diagnosis present

## 2024-02-20 LAB — CBC WITH DIFFERENTIAL/PLATELET
Abs Immature Granulocytes: 0.02 K/uL (ref 0.00–0.07)
Basophils Absolute: 0.1 K/uL (ref 0.0–0.1)
Basophils Relative: 1 %
Eosinophils Absolute: 0.1 K/uL (ref 0.0–0.5)
Eosinophils Relative: 1 %
HCT: 30.5 % — ABNORMAL LOW (ref 39.0–52.0)
Hemoglobin: 10 g/dL — ABNORMAL LOW (ref 13.0–17.0)
Immature Granulocytes: 0 %
Lymphocytes Relative: 23 %
Lymphs Abs: 1.6 K/uL (ref 0.7–4.0)
MCH: 31.3 pg (ref 26.0–34.0)
MCHC: 32.8 g/dL (ref 30.0–36.0)
MCV: 95.6 fL (ref 80.0–100.0)
Monocytes Absolute: 0.8 K/uL (ref 0.1–1.0)
Monocytes Relative: 11 %
Neutro Abs: 4.4 K/uL (ref 1.7–7.7)
Neutrophils Relative %: 64 %
Platelets: 111 K/uL — ABNORMAL LOW (ref 150–400)
RBC: 3.19 MIL/uL — ABNORMAL LOW (ref 4.22–5.81)
RDW: 14.3 % (ref 11.5–15.5)
WBC: 6.8 K/uL (ref 4.0–10.5)
nRBC: 0 % (ref 0.0–0.2)

## 2024-02-20 LAB — ECHOCARDIOGRAM COMPLETE
AR max vel: 2.28 cm2
AV Peak grad: 5.1 mmHg
Ao pk vel: 1.13 m/s
Height: 62 in
S' Lateral: 3.4 cm
Weight: 2409.19 [oz_av]

## 2024-02-20 LAB — PHOSPHORUS: Phosphorus: 5.8 mg/dL — ABNORMAL HIGH (ref 2.5–4.6)

## 2024-02-20 LAB — CBG MONITORING, ED: Glucose-Capillary: 113 mg/dL — ABNORMAL HIGH (ref 70–99)

## 2024-02-20 LAB — BASIC METABOLIC PANEL WITH GFR
Anion gap: 16 — ABNORMAL HIGH (ref 5–15)
BUN: 55 mg/dL — ABNORMAL HIGH (ref 8–23)
CO2: 24 mmol/L (ref 22–32)
Calcium: 8.9 mg/dL (ref 8.9–10.3)
Chloride: 95 mmol/L — ABNORMAL LOW (ref 98–111)
Creatinine, Ser: 7.93 mg/dL — ABNORMAL HIGH (ref 0.61–1.24)
GFR, Estimated: 7 mL/min — ABNORMAL LOW (ref >60)
Glucose, Bld: 116 mg/dL — ABNORMAL HIGH (ref 70–99)
Potassium: 5.9 mmol/L — ABNORMAL HIGH (ref 3.5–5.1)
Sodium: 135 mmol/L (ref 135–145)

## 2024-02-20 LAB — TROPONIN I (HIGH SENSITIVITY)
Troponin I (High Sensitivity): 25 ng/L — ABNORMAL HIGH (ref <18)
Troponin I (High Sensitivity): 25 ng/L — ABNORMAL HIGH (ref <18)

## 2024-02-20 LAB — GLUCOSE, CAPILLARY: Glucose-Capillary: 136 mg/dL — ABNORMAL HIGH (ref 70–99)

## 2024-02-20 LAB — MAGNESIUM: Magnesium: 2.5 mg/dL — ABNORMAL HIGH (ref 1.7–2.4)

## 2024-02-20 MED ORDER — SODIUM CHLORIDE 0.9% FLUSH
3.0000 mL | Freq: Two times a day (BID) | INTRAVENOUS | Status: DC
  Administered 2024-02-20 – 2024-02-22 (×4): 3 mL via INTRAVENOUS
  Administered 2024-02-22: 10 mL via INTRAVENOUS

## 2024-02-20 MED ORDER — MORPHINE SULFATE (PF) 2 MG/ML IV SOLN: 2.0000 mg | INTRAVENOUS | Status: DC | PRN

## 2024-02-20 MED ORDER — DILTIAZEM HCL-DEXTROSE 125-5 MG/125ML-% IV SOLN (PREMIX)
5.0000 mg/h | INTRAVENOUS | Status: DC
  Administered 2024-02-20: 5 mg/h via INTRAVENOUS

## 2024-02-20 MED ORDER — METOPROLOL TARTRATE 25 MG PO TABS
25.0000 mg | ORAL_TABLET | Freq: Three times a day (TID) | ORAL | Status: DC
  Administered 2024-02-20 – 2024-02-21 (×3): 25 mg via ORAL
  Filled 2024-02-20 (×4): qty 1

## 2024-02-20 MED ORDER — HEPARIN BOLUS VIA INFUSION
4000.0000 [IU] | Freq: Once | INTRAVENOUS | Status: AC
  Administered 2024-02-20: 4000 [IU] via INTRAVENOUS
  Filled 2024-02-20: qty 4000

## 2024-02-20 MED ORDER — SODIUM CHLORIDE 0.9% FLUSH
3.0000 mL | Freq: Two times a day (BID) | INTRAVENOUS | Status: DC
  Administered 2024-02-20 – 2024-02-21 (×3): 10 mL via INTRAVENOUS
  Administered 2024-02-22: 3 mL via INTRAVENOUS

## 2024-02-20 MED ORDER — HEPARIN (PORCINE) 25000 UT/250ML-% IV SOLN
1100.0000 [IU]/h | INTRAVENOUS | Status: AC
  Administered 2024-02-20 – 2024-02-22 (×4): 1100 [IU]/h via INTRAVENOUS
  Filled 2024-02-20 (×3): qty 250

## 2024-02-20 MED ORDER — ADENOSINE 6 MG/2ML IV SOLN
12.0000 mg | Freq: Once | INTRAVENOUS | Status: AC
  Administered 2024-02-20: 12 mg via INTRAVENOUS
  Filled 2024-02-20: qty 4

## 2024-02-20 MED ORDER — ACETAMINOPHEN 650 MG RE SUPP: 650.0000 mg | Freq: Four times a day (QID) | RECTAL | Status: DC | PRN

## 2024-02-20 MED ORDER — ACETAMINOPHEN 325 MG PO TABS
650.0000 mg | ORAL_TABLET | Freq: Four times a day (QID) | ORAL | Status: DC | PRN
  Administered 2024-02-21: 650 mg via ORAL
  Filled 2024-02-20: qty 2

## 2024-02-20 MED ORDER — INSULIN ASPART 100 UNIT/ML IJ SOLN: 0.0000 [IU] | Freq: Three times a day (TID) | INTRAMUSCULAR | Status: DC

## 2024-02-20 MED ORDER — SODIUM CHLORIDE 0.9% FLUSH: 3.0000 mL | INTRAVENOUS | Status: DC | PRN

## 2024-02-20 NOTE — ED Notes (Signed)
 Triage completed with help of interpreter ligia # [phone]

## 2024-02-20 NOTE — ED Triage Notes (Signed)
 According to [facility]: Pt was coming from dialysis, he had not started today. Pt was complaining of weakness with tachycardia, hr in 140s.Weakness over several days, no shortness of breath trauma, or recent chest pain. Pt has had no missed dialysis appointments.   Pt has an 18 gauge piv in right hand, pt has had 100 ml of ns during ride to hospital.  Vitals 131/65 97% room air Hr 146 Cbg 150

## 2024-02-20 NOTE — Progress Notes (Signed)
 ANTICOAGULATION CONSULT NOTE  Pharmacy Consult for Heparin  Indication: atrial fibrillation  No Known Allergies  Patient Measurements: Height: 5' 2 (157.5 cm) Weight: 68.3 kg (150 lb 9.2 oz) IBW/kg (Calculated) : 54.6 Heparin  Dosing Weight: 68.3 kg  Vital Signs: Temp: 98.6 F (37 C) (07/11 1332) BP: 139/98 (07/11 1645) Pulse Rate: 142 (07/11 1645)  Labs: Recent Labs    02/20/24 1340  HGB 10.0*  HCT 30.5*  PLT 111*  CREATININE 7.93*  TROPONINIHS 25*    Estimated Creatinine Clearance: 7.5 mL/min (A) (by C-G formula based on SCr of 7.93 mg/dL (H)).   Medical History: Past Medical History:  Diagnosis Date   3rd nerve palsy, partial, right 01/03/2017   AAA (abdominal aortic aneurysm) [facility])    Not noted on CT abd 2019   Chest pain 11/2013   Normal Echo/ EKG/enzymes-hospitalized.  Did not get outpatient stress testing following hospitalization.  No chest pain since   Chronic kidney disease    on dialysis Tues, Thurs and Sat   Diabetes mellitus without complication (HCC)    Diabetes type 2, uncontrolled 11/25/2011   no meds, diet controlled per patient   Diabetic gastroparesis (HCC) 01/22/2017   Hypertension    no meds   Microalbuminuria 01/19/2017   Myocardial infarction (HCC) 2015    Medications:  (Not in a hospital admission)  Scheduled:  Infusions:   diltiazem  (CARDIZEM ) infusion 5 mg/hr (02/20/24 1700)   PRN:   Assessment: 68 yom with a history of ESRD, hypertension, T2DM, MSSA bacteremia. Patient is presenting with weakness. Heparin  per pharmacy consult placed for atrial fibrillation.  Patient is not on anticoagulation prior to arrival.  Hgb 10; plt 111  Goal of Therapy:  Heparin  level 0.3-0.7 units/ml Monitor platelets by anticoagulation protocol: Yes   Plan:  Give IV heparin  4000 units bolus x 1 Start heparin  infusion at 1100 units/hr Check anti-Xa level in 8 hours and daily while on heparin  Continue to monitor H&H and platelets  Dorn Buttner, PharmD, BCPS 02/20/2024 5:04 PM ED Clinical Pharmacist -  [phone]

## 2024-02-20 NOTE — ED Notes (Signed)
 Courtesy call given to 2 west.

## 2024-02-20 NOTE — ED Provider Notes (Signed)
 [place] EMERGENCY DEPARTMENT AT [facility] Provider Note   CSN: [id number] Arrival date & time: 02/20/24  1305     Patient presents with: No chief complaint on file.   Ryan Wells is a 68 y.o. male.   HPI    67 y.o. male with a hx of ESRD, hypertension, T2DM, MSSA bacteremia comes to the emergency room with chief complaint of weakness.  Although in triage, it is indicated that patient has had weakness for several days, he indicates to me that he was feeling fine this morning when he woke up.  Around 8 AM, he started feeling sluggish, started having some  shaking but not chills and feeling dizzy, weak.  He often feels this way while getting dialysis or after dialysis, but today the symptoms were present before dialysis.  Review of system is negative for any chest pain, shortness of breath, nausea, vomiting, diarrhea, URI-like symptoms, fevers, chills.  Patient does indicate that he has had some dizziness and near fainting spells.  He denies any exertional shortness of breath or chest pain in the recent times.  There is no history of PE, DVT. Pt has no hx of PE, DVT and denies any exogenous hormone (testosterone / estrogen) use, long distance travels or surgery in the past 6 weeks, active cancer, recent immobilization.   Prior to Admission medications   Medication Sig Start Date End Date Taking? Authorizing Provider  acetaminophen  (TYLENOL ) 325 MG tablet Take 2 tablets (650 mg total) by mouth every 6 (six) hours as needed for mild pain (or Fever >/= 101). 04/07/21   Ghimire, Kuber, MD  atorvastatin  (LIPITOR) 20 MG tablet TAKE 1 TABLET BY MOUTH DAILY WITH THE EVENING MEAL Patient taking differently: Take 20 mg by mouth daily. 05/23/23   Adella Norris, MD  B Complex-C-Folic Acid  (DIALYVITE 800) 0.8 MG TABS Take 0.8 mg by mouth daily. Take 1 tablet by mouth every day with a meal    [provider]  Darbepoetin Alfa  (ARANESP ) 60 MCG/0.3ML SOSY  injection Inject 0.3 mLs (60 mcg total) into the vein every Friday with hemodialysis. 02/02/21   Katsadouros, Vasilios, MD  doxercalciferol  (HECTOROL ) 4 MCG/2ML injection Inject 2 mLs (4 mcg total) into the vein every Monday, Wednesday, and Friday with hemodialysis. 01/30/21   Katsadouros, Vasilios, MD  folic acid -vitamin b complex-vitamin c-selenium-zinc (DIALYVITE) 3 MG TABS tablet Take 1 tablet by mouth daily.    [provider]  levothyroxine  (SYNTHROID ) 88 MCG tablet TAKE 1 TABLET(88 MCG) BY MOUTH DAILY Patient taking differently: Take 88 mcg by mouth daily before breakfast. 05/23/23   Adella Norris, MD  pentafluoroprop-tetrafluoroeth JUANA) AERO Apply 1 application topically as needed (topical anesthesia for hemodialysis). 01/30/21   Katsadouros, Vasilios, MD  VELTASSA 16.8 g PACK Take 1 Package by mouth daily. Mix 1 packet into water and drink after dinner every day. 05/29/23   [provider]    Allergies: Patient has no known allergies.    Review of Systems  All other systems reviewed and are negative.   Updated Vital Signs BP 111/85   Pulse (!) 146   Temp 98.6 F (37 C)   Resp 14   Ht 5' 2 (1.575 m)   Wt 68.3 kg   SpO2 97%   BMI 27.54 kg/m   Physical Exam Vitals and nursing note reviewed.  Constitutional:      Appearance: He is well-developed.  HENT:     Head: Atraumatic.  Cardiovascular:     Rate and  Rhythm: Regular rhythm. Tachycardia present.  Pulmonary:     Effort: Pulmonary effort is normal.  Musculoskeletal:        General: No tenderness.     Cervical back: Neck supple.     Right lower leg: No edema.     Left lower leg: No edema.     Comments: No signs of DVT  Skin:    General: Skin is warm.  Neurological:     Mental Status: He is alert and oriented to person, place, and time.     Cranial Nerves: No cranial nerve deficit.     Sensory: No sensory deficit.     Motor: No weakness.     Coordination: Coordination normal.      (all labs ordered are listed, but only abnormal results are displayed) Labs Reviewed  CBC WITH DIFFERENTIAL/PLATELET - Abnormal; Notable for the following components:      Result Value   RBC 3.19 (*)    Hemoglobin 10.0 (*)    HCT 30.5 (*)    Platelets 111 (*)    All other components within normal limits  CBG MONITORING, ED - Abnormal; Notable for the following components:   Glucose-Capillary 113 (*)    All other components within normal limits  TROPONIN I (HIGH SENSITIVITY) - Abnormal; Notable for the following components:   Troponin I (High Sensitivity) 25 (*)    All other components within normal limits  BASIC METABOLIC PANEL WITH GFR  MAGNESIUM   PHOSPHORUS  TROPONIN I (HIGH SENSITIVITY)    EKG: EKG Interpretation Date/Time:  Friday February 20 2024 13:12:46 EDT Ventricular Rate:  147 PR Interval:  57 QRS Duration:  109 QT Interval:  332 QTC Calculation: 520 R Axis:   125  Text Interpretation: Sinus tachycardia vs afluuter Probable right ventricular hypertrophy Prolonged QT interval acute changes noted Confirmed by Charlyn Sora ([id number]) on 02/20/2024 1:22:45 PM  Radiology: [facility] Chest Port 1 View Result Date: 02/20/2024 CLINICAL DATA:  Shortness of breath. EXAM: PORTABLE CHEST 1 VIEW COMPARISON:  March 14, 2021. FINDINGS: Stable cardiomegaly. Both lungs are clear. The visualized skeletal structures are unremarkable. IMPRESSION: No active disease. Electronically Signed   By: Lynwood Landy Raddle M.D.   On: 02/20/2024 14:31     .Critical Care  Performed by: Charlyn Sora, MD Authorized by: Charlyn Sora, MD   Critical care provider statement:    Critical care time (minutes):  37   Critical care was necessary to treat or prevent imminent or life-threatening deterioration of the following conditions:  Circulatory failure   Critical care was time spent personally by me on the following activities:  Development of treatment plan with patient or surrogate, discussions with  consultants, evaluation of patient's response to treatment, examination of patient, ordering and review of laboratory studies, ordering and review of radiographic studies, ordering and performing treatments and interventions, pulse oximetry, re-evaluation of patient's condition, review of old charts and obtaining history from patient or surrogate .Cardioversion  Date/Time: 02/20/2024 4:12 PM  Performed by: Charlyn Sora, MD Authorized by: Charlyn Sora, MD   Consent:    Consent obtained:  Verbal   Consent given by:  Patient   Risks discussed:  Induced arrhythmia and death Universal protocol:    Procedure explained and questions answered to patient or proxy's satisfaction: yes     Immediately prior to procedure a time out was called: yes     Patient identity confirmed:  Arm band Pre-procedure details:    Cardioversion basis:  Elective   Rhythm:  Supraventricular tachycardia Post-procedure details:    Patient status:  Awake   Patient tolerance of procedure:  Tolerated well, no immediate complications Comments:     Patient given 12 mg of adenosine .  There was improvement in heart rate that lasted only for about 15 to 20 seconds.  Patient returned to heart rate to 140s.        Medications Ordered in the ED  diltiazem  (CARDIZEM ) 125 mg in dextrose  5% 125 mL (1 mg/mL) infusion (has no administration in time range)  adenosine  (ADENOCARD ) 6 MG/2ML injection 12 mg (12 mg Intravenous Given 02/20/24 1546)                                    Medical Decision Making Amount and/or Complexity of Data Reviewed Labs: ordered. Radiology: ordered.  Risk Prescription drug management. Decision regarding hospitalization.   We tried to employ translation service, however the iPad was not connecting.  Patient's son was at the bedside, he is fluent in [place] and translated for us  in the patient.  Patient was comfortable with her son translating and the son was comfortable translating for his  father.   This patient presents to the ED with chief complaint(s) of weakness, elevated heart rate with pertinent past medical history of ESRD on hemodialysis, bacteremia, diabetes, hypertension.no known cardiac disease history or any arrhythmia.The complaint involves an extensive differential diagnosis and also carries with it a high risk of complications and morbidity.    The differential diagnosis includes : Sinus tachycardia, atrial flutter with 2-1 block, A-fib, PSVT, AVNRT. Given that this is new tachydysrhythmia, differential would also include provoking factors such as electrolyte abnormality, hyperthyroidism, PE, toxin mediated process.  Clinical history is not indicative of any of those secondary causes at this time.  The initial plan is to get basic labs.   Additional history obtained: Additional history obtained from family Records reviewed Primary Care Documents.  It appears that patient also has seen cardiologist, with some chest pain during dialysis.  Nuclear PET scan was ordered, but not completed.  Independent labs interpretation:  The following labs were independently interpreted: CBC is normal.  Metabolic profile is pending at this time.  Independent visualization and interpretation of imaging: - I independently visualized the following imaging with scope of interpretation limited to determining acute life threatening conditions related to emergency care: X-ray of the chest, which revealed no evidence of pulmonary edema  Treatment and Reassessment: Patient received adenosine .  His heart rate slowed, briefly.  However it does not appear that he is in A-fib.  Likely SVT still.  I have consulted cardiology and spoken with Avelina Bonds.  Patient is established with [facility] health cardiology, they will see the patient.  He stable for admission at this time.  Diltiazem  drip has been ordered.  Admitting team will have to call nephrology.    Final diagnoses:  SVT  (supraventricular tachycardia) [facility])    ED Discharge Orders     None          Charlyn Sora, MD 02/20/24 [phone]

## 2024-02-20 NOTE — ED Provider Notes (Signed)
 I discussed this case with Dr. Tobie of the hospitalist service who has been kind enough to come see this patient for admission.  Awaiting cardiology recommendations at this time as well.   Cleotilde Rogue, MD 02/20/24 1700

## 2024-02-20 NOTE — Consult Note (Addendum)
 Cardiology Consultation   Patient ID: Ryan Wells MRN: [id number]; DOB: 06-01-1956  Admit date: 02/20/2024 Date of Consult: 02/20/2024  PCP:  Adella Norris, MD   [facility] Providers Cardiologist:  Ryan LITTIE Nanas, MD     Patient Profile: Ryan Wells is a 68 y.o. male with a hx of hx of ESRD, hypertension, T2DM, MSSA bacteremia  who is being seen 02/20/2024 for the evaluation of A. Fib at the request of Dr. Charlyn.  History of Present Illness: Ryan Wells has past medical history as listed above. He presented to [facility] ED from dialysis, complaining of weakness and tachycardia with heart rate in the 140s.  He states that he has had some weakness over the last several days.  Reports no shortness of breath, trauma, recent chest pain. He denies any missed dialysis appointments.   He reports feeling sluggish, shaky, dizzy, weak. He states that he often feels this way after dialysis, but today he experiences symptoms prior to dialysis.  Relevant workup in the ED includes: CBC stable, CXR no active disease, rhythm strip post adenosine  seems more like a possible atypical flutter vs A-fib  He recently saw Dr. Nanas as an outpatient 08/26/2023.  At this appointment he reported that he has left-sided chest pressure that last his entire dialysis session that resolves once it ends.  He reported a syncopal episode about a month prior to this visit.  He reported most of his symptoms do occur in or around dialysis.  At this appointment they proceeded with a stress PET to evaluate for ischemia, 2 weeks ZIO monitor to evaluate for an arrhythmia. It appears that the the patient did not return the ZIO and did not go for his stress PET.   After speaking with the patient via Spanish interpreter he agrees with the history as stated above. Says he was just at dialysis and because his HR was too elevated to proceed. When I inquired about why he never proceeded  with his ZIO monitor, stress PET, echocardiogram as an outpatient he told me that they had never reached out to schedule his scans.  In regards to the monitor he tells me that he was planning on wearing it but both his PCP and the dialysis staff told him that he should not have it on as they are not responsible for it.  He denied any real symptoms during this event today, mainly just told that he had this high heart rate and could not do dialysis.  In the past he has had issues with chest pain, shortness of breath with his dialysis sessions.  That is what led to the workup that was ordered by Dr. Nanas as above.  Past Medical History:  Diagnosis Date   3rd nerve palsy, partial, right 01/03/2017   AAA (abdominal aortic aneurysm) [facility])    Not noted on CT abd 2019   Chest pain 11/2013   Normal Echo/ EKG/enzymes-hospitalized.  Did not get outpatient stress testing following hospitalization.  No chest pain since   Chronic kidney disease    on dialysis Tues, Thurs and Sat   Diabetes mellitus without complication (HCC)    Diabetes type 2, uncontrolled 11/25/2011   no meds, diet controlled per patient   Diabetic gastroparesis (HCC) 01/22/2017   Hypertension    no meds   Microalbuminuria 01/19/2017   Myocardial infarction [facility]) 2015   Past Surgical History:  Procedure Laterality Date   A/V FISTULAGRAM Left 06/04/2019   Procedure: A/V FISTULAGRAM;  Surgeon: Eliza Ryan RAMAN, MD;  Location: [facility] INVASIVE CV LAB;  Service: Cardiovascular;  Laterality: Left;   A/V FISTULAGRAM Left 10/15/2019   Procedure: A/V FISTULAGRAM;  Surgeon: Eliza Ryan RAMAN, MD;  Location: [facility] INVASIVE CV LAB;  Service: Cardiovascular;  Laterality: Left;   A/V FISTULAGRAM N/A 01/24/2021   Procedure: A/V FISTULAGRAM - Left Upper;  Surgeon: Magda Debby SAILOR, MD;  Location: [facility] INVASIVE CV LAB;  Service: Cardiovascular;  Laterality: N/A;   AV FISTULA PLACEMENT Left 07/02/2018   Procedure: BRACHIOCEPHALIC ARTERIOVENOUS (AV)  FISTULA CREATION LEFT ARM;  Surgeon: Serene Gaile ORN, MD;  Location: [facility] OR;  Service: Vascular;  Laterality: Left;   BASCILIC VEIN TRANSPOSITION Left 07/26/2019   Procedure: Bascilic Vein Transposition;  Surgeon: Eliza Ryan RAMAN, MD;  Location: [facility] OR;  Service: Vascular;  Laterality: Left;   COLONOSCOPY     EMBOLIZATION (CATH LAB) Left 06/04/2019   Procedure: EMBOLIZATION;  Surgeon: Eliza Ryan RAMAN, MD;  Location: [facility] INVASIVE CV LAB;  Service: Cardiovascular;  Laterality: Left;  LT ARM FISTULA/COMPETING BRANCH   EYE SURGERY Bilateral    cataracts x2   EYE SURGERY     INSERTION OF DIALYSIS CATHETER Right 06/08/2019   Procedure: INSERTION OF PALINDROME DIALYSIS CATHETER IN RIGHT INTERNAL JUGULAR;  Surgeon: Eliza Ryan RAMAN, MD;  Location: [facility] OR;  Service: Vascular;  Laterality: Right;   LIGATION OF ARTERIOVENOUS  FISTULA Left 07/26/2019   Procedure: Ligation Of BrachioCephalic  Fistula;  Surgeon: Eliza Ryan RAMAN, MD;  Location: [facility] OR;  Service: Vascular;  Laterality: Left;   NECK SURGERY     PERIPHERAL VASCULAR BALLOON ANGIOPLASTY Left 06/04/2019   Procedure: PERIPHERAL VASCULAR BALLOON ANGIOPLASTY;  Surgeon: Eliza Ryan RAMAN, MD;  Location: [facility] INVASIVE CV LAB;  Service: Cardiovascular;  Laterality: Left;  ARM FISTULA   PERIPHERAL VASCULAR BALLOON ANGIOPLASTY Left 10/15/2019   Procedure: PERIPHERAL VASCULAR BALLOON ANGIOPLASTY;  Surgeon: Eliza Ryan RAMAN, MD;  Location: [facility] INVASIVE CV LAB;  Service: Cardiovascular;  Laterality: Left;  arm fistula   TEE WITHOUT CARDIOVERSION N/A 01/19/2021   Procedure: TRANSESOPHAGEAL ECHOCARDIOGRAM (TEE);  Surgeon: Jeffrie Oneil BROCKS, MD;  Location: [facility] ENDOSCOPY;  Service: Cardiovascular;  Laterality: N/A;    Home Medications:  Prior to Admission medications   Medication Sig Start Date End Date Taking? Authorizing Provider  acetaminophen  (TYLENOL ) 325 MG tablet Take 2 tablets (650 mg total) by mouth every 6 (six) hours as needed  for mild pain (or Fever >/= 101). 04/07/21   Raenelle Coria, MD  atorvastatin  (LIPITOR) 20 MG tablet TAKE 1 TABLET BY MOUTH DAILY WITH THE EVENING MEAL Patient taking differently: Take 20 mg by mouth daily. 05/23/23   Adella Norris, MD  B Complex-C-Folic Acid  (DIALYVITE 800) 0.8 MG TABS Take 0.8 mg by mouth daily. Take 1 tablet by mouth every day with a meal    [provider]  Darbepoetin Alfa  (ARANESP ) 60 MCG/0.3ML SOSY injection Inject 0.3 mLs (60 mcg total) into the vein every Friday with hemodialysis. 02/02/21   Katsadouros, Vasilios, MD  doxercalciferol  (HECTOROL ) 4 MCG/2ML injection Inject 2 mLs (4 mcg total) into the vein every Monday, Wednesday, and Friday with hemodialysis. 01/30/21   Katsadouros, Vasilios, MD  folic acid -vitamin b complex-vitamin c-selenium-zinc (DIALYVITE) 3 MG TABS tablet Take 1 tablet by mouth daily.    [provider]  levothyroxine  (SYNTHROID ) 88 MCG tablet TAKE 1 TABLET(88 MCG) BY MOUTH DAILY Patient taking differently: Take 88 mcg by mouth daily before breakfast. 05/23/23   Adella Norris, MD  pentafluoroprop-tetrafluoroeth JUANA)  AERO Apply 1 application topically as needed (topical anesthesia for hemodialysis). 01/30/21   Katsadouros, Vasilios, MD  VELTASSA 16.8 g PACK Take 1 Package by mouth daily. Mix 1 packet into water and drink after dinner every day. 05/29/23   [provider]    Scheduled Meds:  heparin   4,000 Units Intravenous Once   Continuous Infusions:  diltiazem  (CARDIZEM ) infusion 5 mg/hr (02/20/24 1700)   heparin      PRN Meds:  Allergies:   No Known Allergies  Social History:   Social History   Socioeconomic History   Marital status: Married    Spouse name: Ezella Flint Dodson Valdemar   Number of children: 3   Years of education: 1 year university   Highest education level: Not on file  Occupational History   Occupation: unemployed  Tobacco Use   Smoking status: Former   Smokeless tobacco: Never   Advertising account planner   Vaping status: Never Used  Substance and Sexual Activity   Alcohol use: Not Currently   Drug use: Never   Sexual activity: Yes  Other Topics Concern   Not on file  Social History Narrative   Lives in [place] neighborhood with wife     2 sons live locally      Social Drivers of Health   Financial Resource Strain: Medium Risk (09/23/2023)   Overall Financial Resource Strain (CARDIA)    Difficulty of Paying Living Expenses: Somewhat hard  Food Insecurity: No Food Insecurity (09/23/2023)   Hunger Vital Sign    Worried About Running Out of Food in the Last Year: Never true    Ran Out of Food in the Last Year: Never true  Transportation Needs: Unmet Transportation Needs (09/23/2023)   PRAPARE - Administrator, Civil Service (Medical): Yes    Lack of Transportation (Non-Medical): Yes  Physical Activity: Not on file  Stress: Not on file  Social Connections: Not on file  Intimate Partner Violence: Not on file    Family History:   Family History  Problem Relation Age of Onset   Heart disease Mother        cause of death--CHF?   Diabetes Father    Kidney disease Father        Dialysis for kidney failure   Diabetes Sister    Diabetes Brother    Colon cancer Neg Hx    Stomach cancer Neg Hx    Esophageal cancer Neg Hx     ROS:  Please see the history of present illness.  All other ROS reviewed and negative.     Physical Exam/Data: Vitals:   02/20/24 1332 02/20/24 1400 02/20/24 1515 02/20/24 1645  BP:  (!) 126/93 111/85 (!) 139/98  Pulse:  (!) 143 (!) 146 (!) 142  Resp:  (!) 7 14 10   Temp: 98.6 F (37 C)     SpO2:  96% 97% 100%  Weight:      Height:       No intake or output data in the 24 hours ending 02/20/24 1709    02/20/2024    1:14 PM 09/23/2023   10:24 AM 08/26/2023    2:07 PM  Last 3 Weights  Weight (lbs) 150 lb 9.2 oz 143 lb 147 lb  Weight (kg) 68.3 kg 64.864 kg 66.679 kg     Body mass index is 27.54 kg/m.   General:  Well  nourished, well developed, in no acute distress, on oxygen HEENT: normal Neck: no JVD Vascular: No carotid bruits;  Distal pulses 2+ bilaterally Cardiac:  normal S1, S2; tachycardic; no murmur  Lungs:  clear to auscultation bilaterally, no wheezing, rhonchi or rales  Abd: soft, nontender, no hepatomegaly  Ext: no edema Musculoskeletal:  No deformities Skin: warm and dry  Neuro:  no focal abnormalities noted Psych:  Normal affect   EKG:  The EKG was personally reviewed and demonstrates:  suspected atrial flutter, HR 147   Telemetry:  Telemetry was personally reviewed and demonstrates:  HR remaining 140s, hard to discern rhythm, post-adenosine  strip more likely resembles atypical flutter   Relevant CV Studies: Echocardiogram, 02/20/2024  Ordered, pending results   Laboratory Data: High Sensitivity Troponin:   Recent Labs  Lab 02/20/24 1340  TROPONINIHS 25*     Chemistry Recent Labs  Lab 02/20/24 1340  NA 135  K 5.9*  CL 95*  CO2 24  GLUCOSE 116*  BUN 55*  CREATININE 7.93*  CALCIUM  8.9  MG 2.5*  GFRNONAA 7*  ANIONGAP 16*    No results for input(s): PROT, ALBUMIN , AST, ALT, ALKPHOS, BILITOT in the last 168 hours. Lipids No results for input(s): CHOL, TRIG, HDL, LABVLDL, LDLCALC, CHOLHDL in the last 168 hours.  Hematology Recent Labs  Lab 02/20/24 1340  WBC 6.8  RBC 3.19*  HGB 10.0*  HCT 30.5*  MCV 95.6  MCH 31.3  MCHC 32.8  RDW 14.3  PLT 111*   Thyroid No results for input(s): TSH, FREET4 in the last 168 hours.  BNPNo results for input(s): BNP, PROBNP in the last 168 hours.  DDimer No results for input(s): DDIMER in the last 168 hours.  Radiology/Studies:  DG Chest Port 1 View Result Date: 02/20/2024 CLINICAL DATA:  Shortness of breath. EXAM: PORTABLE CHEST 1 VIEW COMPARISON:  March 14, 2021. FINDINGS: Stable cardiomegaly. Both lungs are clear. The visualized skeletal structures are unremarkable. IMPRESSION: No active  disease. Electronically Signed   By: Lynwood Landy Raddle M.D.   On: 02/20/2024 14:31   Assessment and Plan:  Tachycardia, possible atypical flutter Hypertension  Patient presented from dialysis with elevated heart rate Missed dialysis session today sent straight to ED No prior history of A. Fib/flutter  Rate sustained over the 140s Given adenosine , post adenosine  strip resembles something closer to atypical flutter, still difficult to discern LVEF 60 to 65% from TEE 01/2021  BP 139/98 with HR 140s Patient does not feel poorly at this time  Order updated echocardiogram Start IV diltiazem  Start IV heparin    Per primary ESRD on HD Type 2 diabetes Hyperlipidemia   Risk Assessment/Risk Scores:      CHA2DS2-VASc Score = 3   This indicates a 3.2% annual risk of stroke. The patient's score is based upon: CHF History: 0 HTN History: 1 Diabetes History: 1 Stroke History: 0 Vascular Disease History: 0 Age Score: 1 Gender Score: 0     For questions or updates, please contact [facility] Please consult [email or web address] for contact info under    Signed, Waddell DELENA Donath, PA-C  02/20/2024 5:09 PM  Patient seen and examined with Waddell Donath, PA-C.  Agree as above, with the following exceptions and changes as noted below.  Patient is seen with the assistance of the Spanish language interpreter and the iPad and his family wife and son are present at the bedside as is his nurse.  Patient is overall without complaint aside from some bilateral foot pain.  But denies chest pain, shortness of breath, palpitations, lightheadedness, dizziness.  In the ED he received adenosine  for narrow  complex tachycardia which briefly seems to have resulted in sinus rhythm by review of telemetry strip, but patient has now had recurrent narrow complex tachycardia.  We will attempt IV diltiazem  next as rhythm is still not well discerned but could certainly be atrial flutter or atrial fibrillation given  patient's medical history.  Gen: NAD, CV: Regular and tachycardic lungs: clear, Abd: soft, Extrem: Warm, well perfused, no edema, Neuro/Psych: alert and oriented x 3, normal mood and affect. All available labs, radiology testing, previous records reviewed.  Patient is presenting with a narrow complex tachycardia after dialysis, will attempt to slow the rhythm down for better evaluation but fortunately patient is overall clinically stable at the moment.  Echocardiogram is pending and they arrived at the end of my visit.  I will add metoprolol  25 mg 3 times daily to begin an oral rate control strategy, and can continue diltiazem .  Heparin  has been initiated, can consider DOAC tomorrow if rhythm persists and and appears to be atrial fibrillation or atrial flutter.  [place]acl[place] Acklin A Raihana Balderrama, MD 02/20/24 6:00 PM

## 2024-02-20 NOTE — H&P (Signed)
 History and Physical    Patient: Ryan Wells FMW:[id number] DOB: Jun 26, 1956 DOA: 02/20/2024 DOS: the patient was seen and examined on 02/20/2024 . PCP: Adella Norris, MD  Patient coming from: HD Chief complaint: Tachycardia.   HPI:  Ryan Wells is a 68 y.o. male with past medical history  of  ESRD on hemodialysis Tuesday Thursday and Saturday, hypertension, T2DM, MSSA bacteremia  who is being seen 02/20/2024 for the evaluation for tachycardia, pt did not get his HD session today.  Per report patient had a history of generalized weakness and episode of syncope in the past and was supposed to follow-up after a Zio patch but the patient did not follow through with it.  ED Course:  Vital signs in the ED were notable for the following:  Vitals:   02/20/24 1400 02/20/24 1515 02/20/24 1645 02/20/24 1742  BP: (!) 126/93 111/85 (!) 139/98   Pulse: (!) 143 (!) 146 (!) 142   Temp:    98.3 F (36.8 C)  Resp: (!) 7 14 10    Height:      Weight:      SpO2: 96% 97% 100%   TempSrc:    Oral  BMI (Calculated):      >>ED evaluation thus far shows: Basic metabolic panel shows potassium 5.9 glucose 116 BUN of 55 creatinine 7.93 anion gap 16 LFTs added on and pending. Troponin 25. White count of 6.8 hemoglobin of 10 platelet counts of 111.  >>While in the ED patient received the following: Medications  diltiazem  (CARDIZEM ) 125 mg in dextrose  5% 125 mL (1 mg/mL) infusion (5 mg/hr Intravenous New Bag/Given 02/20/24 1700)  adenosine  (ADENOCARD ) 6 MG/2ML injection 12 mg (12 mg Intravenous Given 02/20/24 1546)   Review of Systems  Cardiovascular:  Positive for leg swelling. Negative for chest pain and palpitations.  All other systems reviewed and are negative.  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  Did not  get outpatient stress testing following hospitalization.  No chest pain since   Chronic kidney disease    on dialysis Tues, Thurs and Sat   Diabetes mellitus without complication (HCC)    Diabetes type 2, uncontrolled 11/25/2011   no meds, diet controlled per patient   Diabetic gastroparesis (HCC) 01/22/2017   Hypertension    no meds   Microalbuminuria 01/19/2017   Myocardial infarction [facility]) 2015   Past Surgical History:  Procedure Laterality Date   A/V FISTULAGRAM Left 06/04/2019   Procedure: A/V FISTULAGRAM;  Surgeon: Eliza Lonni RAMAN, MD;  Location: [facility] INVASIVE CV LAB;  Service: Cardiovascular;  Laterality: Left;   A/V FISTULAGRAM Left 10/15/2019   Procedure: A/V FISTULAGRAM;  Surgeon: Eliza Lonni RAMAN, MD;  Location: [facility] INVASIVE CV LAB;  Service: Cardiovascular;  Laterality: Left;   A/V FISTULAGRAM N/A 01/24/2021   Procedure: A/V FISTULAGRAM - Left Upper;  Surgeon: Magda Debby SAILOR, MD;  Location: [facility] INVASIVE CV LAB;  Service: Cardiovascular;  Laterality: N/A;   AV FISTULA PLACEMENT Left 07/02/2018   Procedure: BRACHIOCEPHALIC ARTERIOVENOUS (AV) FISTULA CREATION LEFT ARM;  Surgeon: Serene Gaile ORN, MD;  Location: [facility] OR;  Service: Vascular;  Laterality: Left;   BASCILIC VEIN TRANSPOSITION Left 07/26/2019   Procedure: Bascilic Vein Transposition;  Surgeon: Eliza Lonni RAMAN, MD;  Location: [facility] OR;  Service: Vascular;  Laterality: Left;  COLONOSCOPY     EMBOLIZATION (CATH LAB) Left 06/04/2019   Procedure: EMBOLIZATION;  Surgeon: Eliza Lonni RAMAN, MD;  Location: [facility] INVASIVE CV LAB;  Service: Cardiovascular;  Laterality: Left;  LT ARM FISTULA/COMPETING BRANCH   EYE SURGERY Bilateral    cataracts x2   EYE SURGERY     INSERTION OF DIALYSIS CATHETER Right 06/08/2019   Procedure: INSERTION OF PALINDROME DIALYSIS CATHETER IN RIGHT INTERNAL JUGULAR;  Surgeon: Eliza Lonni RAMAN, MD;  Location: [facility] OR;  Service: Vascular;  Laterality: Right;   LIGATION OF ARTERIOVENOUS   FISTULA Left 07/26/2019   Procedure: Ligation Of BrachioCephalic  Fistula;  Surgeon: Eliza Lonni RAMAN, MD;  Location[facility] OR;  Service: Vascular;  Laterality: Left;   NECK SURGERY     PERIPHERAL VASCULAR BALLOON ANGIOPLASTY Left 06/04/2019   Procedure: PERIPHERAL VASCULAR BALLOON ANGIOPLASTY;  Surgeon: Eliza Lonni RAMAN, MD;  Location: [facility] INVASIVE CV LAB;  Service: Cardiovascular;  Laterality: Left;  ARM FISTULA   PERIPHERAL VASCULAR BALLOON ANGIOPLASTY Left 10/15/2019   Procedure: PERIPHERAL VASCULAR BALLOON ANGIOPLASTY;  Surgeon: Eliza Lonni RAMAN, MD;  Location: [facility] INVASIVE CV LAB;  Service: Cardiovascular;  Laterality: Left;  arm fistula   TEE WITHOUT CARDIOVERSION N/A 01/19/2021   Procedure: TRANSESOPHAGEAL ECHOCARDIOGRAM (TEE);  Surgeon: Jeffrie Oneil BROCKS, MD;  Location: [facility] ENDOSCOPY;  Service: Cardiovascular;  Laterality: N/A;    reports that he has quit smoking. He has never used smokeless tobacco. He reports that he does not currently use alcohol. He reports that he does not use drugs. No Known Allergies Family History  Problem Relation Age of Onset   Heart disease Mother        cause of death--CHF?   Diabetes Father    Kidney disease Father        Dialysis for kidney failure   Diabetes Sister    Diabetes Brother    Colon cancer Neg Hx    Stomach cancer Neg Hx    Esophageal cancer Neg Hx    Prior to Admission medications   Medication Sig Start Date End Date Taking? Authorizing Provider  acetaminophen  (TYLENOL ) 325 MG tablet Take 2 tablets (650 mg total) by mouth every 6 (six) hours as needed for mild pain (or Fever >/= 101). 04/07/21   Raenelle Coria, MD  atorvastatin  (LIPITOR) 20 MG tablet TAKE 1 TABLET BY MOUTH DAILY WITH THE EVENING MEAL Patient taking differently: Take 20 mg by mouth daily. 05/23/23   Adella Norris, MD  B Complex-C-Folic Acid  (DIALYVITE 800) 0.8 MG TABS Take 0.8 mg by mouth daily. Take 1 tablet by mouth every day with a meal    [provider]  Darbepoetin Alfa  (ARANESP ) 60 MCG/0.3ML SOSY injection Inject 0.3 mLs (60 mcg total) into the vein every Friday with hemodialysis. 02/02/21   Katsadouros, Vasilios, MD  doxercalciferol  (HECTOROL ) 4 MCG/2ML injection Inject 2 mLs (4 mcg total) into the vein every Monday, Wednesday, and Friday with hemodialysis. 01/30/21   Katsadouros, Vasilios, MD  folic acid -vitamin b complex-vitamin c-selenium-zinc (DIALYVITE) 3 MG TABS tablet Take 1 tablet by mouth daily.    [provider]  levothyroxine  (SYNTHROID ) 88 MCG tablet TAKE 1 TABLET(88 MCG) BY MOUTH DAILY Patient taking differently: Take 88 mcg by mouth daily before breakfast. 05/23/23   Adella Norris, MD  pentafluoroprop-tetrafluoroeth JUANA) AERO Apply 1 application topically as needed (topical anesthesia for hemodialysis). 01/30/21   Katsadouros, Vasilios, MD  VELTASSA 16.8 g PACK Take 1 Package by mouth daily. Mix 1 packet into water and drink after  dinner every day. 05/29/23   [provider]                                                                                 Vitals:   02/20/24 1400 02/20/24 1515 02/20/24 1645 02/20/24 1742  BP: (!) 126/93 111/85 (!) 139/98   Pulse: (!) 143 (!) 146 (!) 142   Resp: (!) 7 14 10    Temp:    98.3 F (36.8 C)  TempSrc:    Oral  SpO2: 96% 97% 100%   Weight:      Height:       Physical Exam Vitals reviewed.  Constitutional:      General: He is not in acute distress.    Appearance: He is not ill-appearing.  HENT:     Head: Normocephalic.  Eyes:     Extraocular Movements: Extraocular movements intact.  Cardiovascular:     Rate and Rhythm: Regular rhythm. Tachycardia present.     Heart sounds: Normal heart sounds.  Pulmonary:     Effort: Pulmonary effort is normal.     Breath sounds: Normal breath sounds. No rales.  Abdominal:     General: There is no distension.     Palpations: Abdomen is soft.     Tenderness: There is no abdominal tenderness.   Musculoskeletal:     Right lower leg: No edema.     Left lower leg: No edema.  Neurological:     General: No focal deficit present.     Mental Status: He is alert and oriented to person, place, and time.     Labs on Admission: I have personally reviewed following labs and imaging studies CBC: Recent Labs  Lab 02/20/24 1340  WBC 6.8  NEUTROABS 4.4  HGB 10.0*  HCT 30.5*  MCV 95.6  PLT 111*   Basic Metabolic Panel: Recent Labs  Lab 02/20/24 1340  NA 135  K 5.9*  CL 95*  CO2 24  GLUCOSE 116*  BUN 55*  CREATININE 7.93*  CALCIUM  8.9  MG 2.5*  PHOS 5.8*   GFR: Estimated Creatinine Clearance: 7.5 mL/min (A) (by C-G formula based on SCr of 7.93 mg/dL (H)). Liver Function Tests: No results for input(s): AST, ALT, ALKPHOS, BILITOT, PROT, ALBUMIN  in the last 168 hours. No results for input(s): LIPASE, AMYLASE in the last 168 hours. No results for input(s): AMMONIA in the last 168 hours. Coagulation Profile: No results for input(s): INR, PROTIME in the last 168 hours. Cardiac Enzymes: No results for input(s): CKTOTAL, CKMB, CKMBINDEX, TROPONINI in the last 168 hours. BNP (last 3 results) No results for input(s): PROBNP in the last 8760 hours. HbA1C: No results for input(s): HGBA1C in the last 72 hours. CBG: Recent Labs  Lab 02/20/24 1317  GLUCAP 113*   Lipid Profile: No results for input(s): CHOL, HDL, LDLCALC, TRIG, CHOLHDL, LDLDIRECT in the last 72 hours. Thyroid Function Tests: No results for input(s): TSH, T4TOTAL, FREET4, T3FREE, THYROIDAB in the last 72 hours. Anemia Panel: No results for input(s): VITAMINB12, FOLATE, FERRITIN, TIBC, IRON , RETICCTPCT in the last 72 hours. Urine analysis:    Component Value Date/Time   COLORURINE YELLOW 06/06/2019 1530   APPEARANCEUR CLEAR 06/06/2019  1530   LABSPEC 1.014 06/06/2019 1530   PHURINE 6.0 06/06/2019 1530   GLUCOSEU 150 (A) 06/06/2019 1530    HGBUR SMALL (A) 06/06/2019 1530   BILIRUBINUR NEGATIVE 06/06/2019 1530   BILIRUBINUR neg 12/19/2016 1512   KETONESUR NEGATIVE 06/06/2019 1530   PROTEINUR >=300 (A) 06/06/2019 1530   UROBILINOGEN 0.2 12/19/2016 1512   UROBILINOGEN 1.0 03/29/2010 1944   NITRITE NEGATIVE 06/06/2019 1530   LEUKOCYTESUR NEGATIVE 06/06/2019 1530   Radiological Exams on Admission: ECHOCARDIOGRAM COMPLETE Result Date: 02/20/2024    ECHOCARDIOGRAM REPORT   Patient Name:   Ryan Wells Date of Exam: 02/20/2024 Medical Rec #:  [id number]            Height:       62.0 in Accession #:    [id number]           Weight:       150.6 lb Date of Birth:  [birth year]             BSA:          1.694 m Patient Age:    68 years             BP:           139/98 mmHg Patient Gender: M                    HR:           142 bpm. Exam Location:  Inpatient Procedure: 2D Echo, Cardiac Doppler and Color Doppler (Both Spectral and Color            Flow Doppler were utilized during procedure). Indications:    Atrial Flutter I48.92  History:        Patient has prior history of Echocardiogram examinations, most                 recent 01/16/2021. CKD, stage 4; Risk Factors:Hypertension,                 Diabetes and Dyslipidemia.  Sonographer:    Thea Norlander RCS Referring Phys: WADDELL LABOR PARCELLS IMPRESSIONS  1. Left ventricular ejection fraction, by estimation, is 35 to 40%. The left ventricle has moderately decreased function. The left ventricle demonstrates global hypokinesis. There is mild left ventricular hypertrophy. Left ventricular diastolic parameters are indeterminate.  2. Right ventricular systolic function is mildly reduced. The right ventricular size is normal. There is moderately elevated pulmonary artery systolic pressure. The estimated right ventricular systolic pressure is 48.4 mmHg.  3. The mitral valve is degenerative. Trivial mitral valve regurgitation.  4. Tricuspid valve regurgitation is moderate.  5. The aortic valve is tricuspid.  Aortic valve regurgitation is not visualized. Aortic valve sclerosis/calcification is present, without any evidence of aortic stenosis.  6. The inferior vena cava is dilated in size with <50% respiratory variability, suggesting right atrial pressure of 15 mmHg. Conclusion(s)/Recommendation(s): Technically difficult study, patient tachycardic to 140s. Would consider repeat limited echo with contrast once heart rate improved. FINDINGS  Left Ventricle: Left ventricular ejection fraction, by estimation, is 35 to 40%. The left ventricle has moderately decreased function. The left ventricle demonstrates global hypokinesis. The left ventricular internal cavity size was normal in size. There is mild left ventricular hypertrophy. Left ventricular diastolic parameters are indeterminate. Right Ventricle: The right ventricular size is normal. No increase in right ventricular wall thickness. Right ventricular systolic function is mildly reduced. There is moderately elevated pulmonary artery systolic pressure. The tricuspid regurgitant velocity  is 2.89 m/s, and with an assumed right atrial pressure of 15 mmHg, the estimated right ventricular systolic pressure is 48.4 mmHg. Left Atrium: Left atrial size was normal in size. Right Atrium: Right atrial size was normal in size. Pericardium: There is no evidence of pericardial effusion. Mitral Valve: The mitral valve is degenerative in appearance. Mild to moderate mitral annular calcification. Trivial mitral valve regurgitation. Tricuspid Valve: The tricuspid valve is normal in structure. Tricuspid valve regurgitation is moderate. Aortic Valve: The aortic valve is tricuspid. Aortic valve regurgitation is not visualized. Aortic valve sclerosis/calcification is present, without any evidence of aortic stenosis. Aortic valve peak gradient measures 5.1 mmHg. Pulmonic Valve: The pulmonic valve was grossly normal. Pulmonic valve regurgitation is trivial. Aorta: The aortic root and ascending  aorta are structurally normal, with no evidence of dilitation. Venous: The inferior vena cava is dilated in size with less than 50% respiratory variability, suggesting right atrial pressure of 15 mmHg. IAS/Shunts: The interatrial septum was not well visualized.  LEFT VENTRICLE PLAX 2D LVIDd:         4.70 cm LVIDs:         3.40 cm LV PW:         1.00 cm LV IVS:        0.90 cm LVOT diam:     2.00 cm LV SV:         36 LV SV Index:   21 LVOT Area:     3.14 cm  RIGHT VENTRICLE            IVC RV S prime:     9.25 cm/s  IVC diam: 2.40 cm TAPSE (M-mode): 1.1 cm LEFT ATRIUM             Index        RIGHT ATRIUM           Index LA diam:        4.50 cm 2.66 cm/m   RA Area:     15.70 cm LA Vol (A2C):   58.7 ml 34.64 ml/m  RA Volume:   38.20 ml  22.54 ml/m LA Vol (A4C):   49.1 ml 28.98 ml/m LA Biplane Vol: 53.5 ml 31.57 ml/m  AORTIC VALVE AV Area (Vmax): 2.28 cm AV Vmax:        113.00 cm/s AV Peak Grad:   5.1 mmHg LVOT Vmax:      82.00 cm/s LVOT Vmean:     55.550 cm/s LVOT VTI:       0.114 m  AORTA Ao Root diam: 3.50 cm Ao Asc diam:  3.60 cm TRICUSPID VALVE TR Peak grad:   33.4 mmHg TR Vmax:        289.00 cm/s  SHUNTS Systemic VTI:  0.11 m Systemic Diam: 2.00 cm Lonni Nanas MD Electronically signed by Lonni Nanas MD Signature Date/Time: 02/20/2024/6:17:36 PM    Final    DG Chest Port 1 View Result Date: 02/20/2024 CLINICAL DATA:  Shortness of breath. EXAM: PORTABLE CHEST 1 VIEW COMPARISON:  March 14, 2021. FINDINGS: Stable cardiomegaly. Both lungs are clear. The visualized skeletal structures are unremarkable. IMPRESSION: No active disease. Electronically Signed   By: Lynwood Landy Raddle M.D.   On: 02/20/2024 14:31   Data Reviewed: Relevant notes from primary care and specialist visits, past discharge summaries as available in EHR, including Care Everywhere . Prior diagnostic testing as pertinent to current admission diagnoses, Updated medications and problem lists for reconciliation .ED course,  including vitals, labs, imaging, treatment  and response to treatment,Triage notes, nursing and pharmacy notes and ED provider's notes.Notable results as noted in HPI.Discussed case with EDMD/ ED APP/ or Specialty MD on call and as needed.  Assessment & Plan  >> Tachycardia/A flutter with RVR: Continue patient on diltiazem  drip and heparin  gtt.   >> End-stage renal disease on hemodialysis: Nephrology consult per a.m. team.  Patient did not receive HD session today. Continue patient on Dialyvite, Veltassa.   >> CAD: Stable cont patient on atorvastatin .   >> Diabetes mellitus type 2: Glycemic protocol.    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     Latest Ref Rng & Units 02/20/2024    1:40 PM 09/30/2023    9:21 AM 04/16/2022   11:17 AM  CBC  WBC 4.0 - 10.5 K/uL 6.8  5.6  7.1   Hemoglobin 13.0 - 17.0 g/dL 89.9  89.2  88.4   Hematocrit 39.0 - 52.0 % 30.5  31.7  33.7   Platelets 150 - 400 K/uL 111  82  162   Type and screen. IVF.    DVT prophylaxis:  Heparin  GTT  Consults:  Neurology  Advance Care Planning:    Code Status: Full Code   Family Communication:  None Disposition Plan:  Home Severity of Illness: The appropriate patient status for this patient is OBSERVATION. Observation status is judged to be reasonable and necessary in order to provide the required intensity of service to ensure the patient's safety. The patient's presenting symptoms, physical exam findings, and initial radiographic and laboratory data in the context of their medical condition is felt to place them at decreased risk for further clinical deterioration. Furthermore, it is anticipated that the patient will be medically stable for discharge from the hospital within 2 midnights of admission.   Unresulted Labs (From admission, onward)     Start     Ordered   02/21/24 0500   Heparin  level (unfractionated)  Daily,   R      02/20/24 1705   02/21/24 0500  Comprehensive metabolic panel  Tomorrow morning,   R        02/20/24 1816   02/21/24 0500  CBC  Tomorrow morning,   R        02/20/24 1816   02/21/24 0100  Heparin  level (unfractionated)  Once-Timed,   URGENT        02/20/24 1705   02/20/24 1757  HIV Antibody (routine testing w rflx)  (HIV Antibody (Routine testing w reflex) panel)  Once,   R        02/20/24 1816            Meds ordered this encounter  Medications   adenosine  (ADENOCARD ) 6 MG/2ML injection 12 mg   diltiazem  (CARDIZEM ) 125 mg in dextrose  5% 125 mL (1 mg/mL) infusion   heparin  ADULT infusion 100 units/mL (25000 units/250mL)   heparin  bolus via infusion 4,000 Units   metoprolol  tartrate (LOPRESSOR ) tablet 25 mg   insulin  aspart (novoLOG ) injection 0-15 Units    Correction coverage::   Moderate (average weight, post-op)    CBG < 70::   implement hypoglycemia protocol    CBG 70 - 120::   0 units    CBG 121 - 150::   2 units    CBG 151 - 200::   3 units    CBG 201 -  250::   5 units    CBG 251 - 300::   8 units    CBG 301 - 350::   11 units    CBG 351 - 400::   15 units    CBG > 400:   call MD and obtain STAT lab verification   sodium chloride  flush (NS) 0.9 % injection 3 mL   OR Linked Order Group    acetaminophen  (TYLENOL ) tablet 650 mg    acetaminophen  (TYLENOL ) suppository 650 mg   morphine  (PF) 2 MG/ML injection 2 mg   sodium chloride  flush (NS) 0.9 % injection 3-10 mL   sodium chloride  flush (NS) 0.9 % injection 3-10 mL     Orders Placed This Encounter  Procedures   Critical Care   ELECTRICAL CARDIOVERSION   DG Chest Port 1 View   Basic metabolic panel   CBC with Differential   Magnesium    Phosphorus   Heparin  level (unfractionated)   Heparin  level (unfractionated)   HIV Antibody (routine testing w rflx)   Comprehensive metabolic panel   CBC   Amb referral to AFIB Clinic   Diet Carb Modified Fluid consistency:  Thin; Room service appropriate? Yes   ED Cardiac monitoring   ED Cardiac monitoring   Cardiac Monitoring - Continuous Indefinite   Apply Diabetes Mellitus Care Plan   STAT CBG when hypoglycemia is suspected. If treated, recheck every 15 minutes after each treatment until CBG >/= 70 mg/dl   Refer to Hypoglycemia Protocol Sidebar Report for treatment of CBG < 70 mg/dl   No HS correction Insulin    Maintain IV access   Vital signs   Notify physician (specify)   Mobility Protocol: No Restrictions RN to initiate protocols based on patient's level of care   Refer to Sidebar Report Refer to ICU, Med-Surg, Progressive, and Step-Down Mobility Protocol Sidebars   Initiate Adult Central Line Maintenance and Catheter Protocol for patients with central line (CVC, PICC, Port, Hemodialysis, Trialysis)   Daily weights   Intake and Output   Do not place and if present remove PureWick   Initiate Oral Care Protocol   Initiate Carrier Fluid Protocol   RN may order General Admission PRN Orders utilizing General Admission PRN medications (through manage orders) for the following patient needs: allergy symptoms (Claritin ), cold sores (Carmex), cough (Robitussin DM), eye irritation (Liquifilm Tears), hemorrhoids (Tucks), indigestion (Maalox), minor skin irritation (Hydrocortisone Cream), muscle pain (Ben Gay), nose irritation (saline nasal spray) and sore throat (Chloraseptic spray).   Notify physician (specify)   Target HR: 65-105 bpm with return to baseline rhythm and hemodynamic stability   Apply Atrial Arrhythmia Care Plan   Full code   Inpatient consult to Cardiology   Consult for [facility] Admission   heparin  per pharmacy consult   Pulse oximetry check with vital signs   Oxygen therapy Mode or (Route): Nasal cannula; Liters Per Minute: 2; Keep O2 saturation between: greater than 92 %   CBG monitoring, ED   EKG 12-Lead   EKG 12-lead   ECHOCARDIOGRAM COMPLETE   Place in observation (patient's  expected length of stay will be less than 2 midnights)   Aspiration precautions   Fall precautions    Author: Mario LULLA Blanch, MD 12 pm- 8 pm. [facility]. 02/20/2024 6:38 PM Please note for any communication after hours contact [facility] Assigned provider on call on Amion.

## 2024-02-20 NOTE — ED Notes (Signed)
 [facility] called to add pt to monitor services.

## 2024-02-20 NOTE — Progress Notes (Signed)
 Echocardiogram 2D Echocardiogram has been performed.  Ryan Wells 02/20/2024, 5:50 PM

## 2024-02-21 ENCOUNTER — Other Ambulatory Visit: Payer: Self-pay

## 2024-02-21 DIAGNOSIS — I4892 Unspecified atrial flutter: Secondary | ICD-10-CM | POA: Diagnosis present

## 2024-02-21 DIAGNOSIS — E119 Type 2 diabetes mellitus without complications: Secondary | ICD-10-CM | POA: Diagnosis not present

## 2024-02-21 DIAGNOSIS — D696 Thrombocytopenia, unspecified: Secondary | ICD-10-CM

## 2024-02-21 DIAGNOSIS — I952 Hypotension due to drugs: Secondary | ICD-10-CM | POA: Diagnosis not present

## 2024-02-21 DIAGNOSIS — R000 Tachycardia, unspecified: Secondary | ICD-10-CM | POA: Diagnosis not present

## 2024-02-21 DIAGNOSIS — I484 Atypical atrial flutter: Secondary | ICD-10-CM | POA: Diagnosis present

## 2024-02-21 LAB — CBC
HCT: 30.4 % — ABNORMAL LOW (ref 39.0–52.0)
Hemoglobin: 9.9 g/dL — ABNORMAL LOW (ref 13.0–17.0)
MCH: 30.9 pg (ref 26.0–34.0)
MCHC: 32.6 g/dL (ref 30.0–36.0)
MCV: 95 fL (ref 80.0–100.0)
Platelets: 114 K/uL — ABNORMAL LOW (ref 150–400)
RBC: 3.2 MIL/uL — ABNORMAL LOW (ref 4.22–5.81)
RDW: 14.3 % (ref 11.5–15.5)
WBC: 5.5 K/uL (ref 4.0–10.5)
nRBC: 0 % (ref 0.0–0.2)

## 2024-02-21 LAB — COMPREHENSIVE METABOLIC PANEL WITH GFR
ALT: 14 U/L (ref 0–44)
AST: 11 U/L — ABNORMAL LOW (ref 15–41)
Albumin: 3.7 g/dL (ref 3.5–5.0)
Alkaline Phosphatase: 103 U/L (ref 38–126)
Anion gap: 16 — ABNORMAL HIGH (ref 5–15)
BUN: 69 mg/dL — ABNORMAL HIGH (ref 8–23)
CO2: 24 mmol/L (ref 22–32)
Calcium: 8.8 mg/dL — ABNORMAL LOW (ref 8.9–10.3)
Chloride: 95 mmol/L — ABNORMAL LOW (ref 98–111)
Creatinine, Ser: 9.33 mg/dL — ABNORMAL HIGH (ref 0.61–1.24)
GFR, Estimated: 6 mL/min — ABNORMAL LOW (ref >60)
Glucose, Bld: 116 mg/dL — ABNORMAL HIGH (ref 70–99)
Potassium: 6.9 mmol/L (ref 3.5–5.1)
Sodium: 135 mmol/L (ref 135–145)
Total Bilirubin: 1.2 mg/dL (ref 0.0–1.2)
Total Protein: 7.2 g/dL (ref 6.5–8.1)

## 2024-02-21 LAB — BASIC METABOLIC PANEL WITH GFR
Anion gap: 14 (ref 5–15)
BUN: 32 mg/dL — ABNORMAL HIGH (ref 8–23)
CO2: 23 mmol/L (ref 22–32)
Calcium: 8.3 mg/dL — ABNORMAL LOW (ref 8.9–10.3)
Chloride: 98 mmol/L (ref 98–111)
Creatinine, Ser: 5.24 mg/dL — ABNORMAL HIGH (ref 0.61–1.24)
GFR, Estimated: 11 mL/min — ABNORMAL LOW (ref >60)
Glucose, Bld: 114 mg/dL — ABNORMAL HIGH (ref 70–99)
Potassium: 4.8 mmol/L (ref 3.5–5.1)
Sodium: 135 mmol/L (ref 135–145)

## 2024-02-21 LAB — GLUCOSE, CAPILLARY
Glucose-Capillary: 100 mg/dL — ABNORMAL HIGH (ref 70–99)
Glucose-Capillary: 110 mg/dL — ABNORMAL HIGH (ref 70–99)
Glucose-Capillary: 113 mg/dL — ABNORMAL HIGH (ref 70–99)
Glucose-Capillary: 120 mg/dL — ABNORMAL HIGH (ref 70–99)
Glucose-Capillary: 109 mg/dL — ABNORMAL HIGH (ref 70–99)

## 2024-02-21 LAB — HEPARIN LEVEL (UNFRACTIONATED)
Heparin Unfractionated: 0.47 [IU]/mL (ref 0.30–0.70)
Heparin Unfractionated: 0.55 [IU]/mL (ref 0.30–0.70)

## 2024-02-21 LAB — TSH: TSH: 9.759 u[IU]/mL — ABNORMAL HIGH (ref 0.350–4.500)

## 2024-02-21 LAB — LACTIC ACID, PLASMA: Lactic Acid, Venous: 2.8 mmol/L (ref 0.5–1.9)

## 2024-02-21 LAB — HEPATITIS B SURFACE ANTIGEN: Hepatitis B Surface Ag: NONREACTIVE

## 2024-02-21 LAB — HIV ANTIBODY (ROUTINE TESTING W REFLEX): HIV Screen 4th Generation wRfx: NONREACTIVE

## 2024-02-21 MED ORDER — ATROPINE SULFATE 1 MG/10ML IJ SOSY: 1.0000 mg | PREFILLED_SYRINGE | INTRAMUSCULAR | Status: AC | PRN

## 2024-02-21 MED ORDER — LACTATED RINGERS IV BOLUS
500.0000 mL | Freq: Once | INTRAVENOUS | Status: AC
  Administered 2024-02-21: 500 mL via INTRAVENOUS

## 2024-02-21 MED ORDER — SODIUM CHLORIDE 0.9 % IV BOLUS: 250.0000 mL | Freq: Once | INTRAVENOUS | Status: DC

## 2024-02-21 MED ORDER — ONDANSETRON HCL 4 MG/2ML IJ SOLN
4.0000 mg | Freq: Four times a day (QID) | INTRAMUSCULAR | Status: DC | PRN
  Administered 2024-02-21 – 2024-02-22 (×2): 4 mg via INTRAVENOUS
  Filled 2024-02-21 (×2): qty 2

## 2024-02-21 MED ORDER — CALCIUM GLUCONATE-NACL 1-0.675 GM/50ML-% IV SOLN
INTRAVENOUS | Status: AC
  Administered 2024-02-21: 1000 mg via INTRAVENOUS
  Filled 2024-02-21: qty 50

## 2024-02-21 MED ORDER — SODIUM CHLORIDE 0.9 % IV BOLUS
1000.0000 mL | Freq: Once | INTRAVENOUS | Status: AC
  Administered 2024-02-21: 1000 mL via INTRAVENOUS

## 2024-02-21 MED ORDER — SODIUM CHLORIDE 0.9 % IV SOLN: 250.0000 mL | INTRAVENOUS | Status: AC

## 2024-02-21 MED ORDER — PHENYLEPHRINE HCL-NACL 20-0.9 MG/250ML-% IV SOLN: 25.0000 ug/min | INTRAVENOUS | Status: DC

## 2024-02-21 MED ORDER — ATROPINE SULFATE 1 MG/10ML IJ SOSY
PREFILLED_SYRINGE | INTRAMUSCULAR | Status: AC
  Administered 2024-02-21: 1 mg via INTRAVENOUS
  Filled 2024-02-21: qty 10

## 2024-02-21 MED ORDER — CALCIUM GLUCONATE-NACL 1-0.675 GM/50ML-% IV SOLN
1.0000 g | Freq: Once | INTRAVENOUS | Status: AC
  Administered 2024-02-21: 1000 mg via INTRAVENOUS
  Filled 2024-02-21: qty 50

## 2024-02-21 MED ORDER — METOPROLOL TARTRATE 25 MG PO TABS: 25.0000 mg | ORAL_TABLET | Freq: Three times a day (TID) | ORAL | Status: DC

## 2024-02-21 MED ORDER — SODIUM CHLORIDE 0.9 % IV BOLUS: 250.0000 mL | INTRAVENOUS | Status: DC | PRN

## 2024-02-21 MED ORDER — CHLORHEXIDINE GLUCONATE CLOTH 2 % EX PADS
6.0000 | MEDICATED_PAD | Freq: Every day | CUTANEOUS | Status: DC
  Administered 2024-02-22 – 2024-02-23 (×2): 6 via TOPICAL

## 2024-02-21 MED ORDER — ATORVASTATIN CALCIUM 10 MG PO TABS
20.0000 mg | ORAL_TABLET | Freq: Every day | ORAL | Status: DC
  Administered 2024-02-21 – 2024-02-22 (×2): 20 mg via ORAL
  Filled 2024-02-21 (×2): qty 2

## 2024-02-21 MED ORDER — SEVELAMER CARBONATE 800 MG PO TABS
1600.0000 mg | ORAL_TABLET | Freq: Three times a day (TID) | ORAL | Status: DC
  Administered 2024-02-21 – 2024-02-22 (×4): 1600 mg via ORAL
  Filled 2024-02-21 (×4): qty 2

## 2024-02-21 MED ORDER — SODIUM CHLORIDE 0.9 % IV BOLUS
500.0000 mL | INTRAVENOUS | Status: DC | PRN
  Administered 2024-02-21: 500 mL via INTRAVENOUS

## 2024-02-21 MED ORDER — LEVOTHYROXINE SODIUM 88 MCG PO TABS
88.0000 ug | ORAL_TABLET | Freq: Every day | ORAL | Status: DC
  Administered 2024-02-22 – 2024-02-23 (×2): 88 ug via ORAL
  Filled 2024-02-21 (×3): qty 1

## 2024-02-21 MED ORDER — CALCIUM GLUCONATE-NACL 1-0.675 GM/50ML-% IV SOLN: 1.0000 g | Freq: Once | INTRAVENOUS | Status: AC

## 2024-02-21 NOTE — Plan of Care (Signed)
   Problem: Education: Goal: Ability to describe self-care measures that may prevent or decrease complications (Diabetes Survival Skills Education) will improve Outcome: Progressing Goal: Individualized Educational Video(s) Outcome: Progressing   Problem: Coping: Goal: Ability to adjust to condition or change in health will improve Outcome: Progressing   Problem: Fluid Volume: Goal: Ability to maintain a balanced intake and output will improve Outcome: Progressing   Problem: Health Behavior/Discharge Planning: Goal: Ability to identify and utilize available resources and services will improve Outcome: Progressing Goal: Ability to manage health-related needs will improve Outcome: Progressing   Problem: Metabolic: Goal: Ability to maintain appropriate glucose levels will improve Outcome: Progressing   Problem: Nutritional: Goal: Maintenance of adequate nutrition will improve Outcome: Progressing Goal: Progress toward achieving an optimal weight will improve Outcome: Progressing   Problem: Skin Integrity: Goal: Risk for impaired skin integrity will decrease Outcome: Progressing   Problem: Tissue Perfusion: Goal: Adequacy of tissue perfusion will improve Outcome: Progressing   Problem: Education: Goal: Knowledge of General Education information will improve Description: Including pain rating scale, medication(s)/side effects and non-pharmacologic comfort measures Outcome: Progressing   Problem: Health Behavior/Discharge Planning: Goal: Ability to manage health-related needs will improve Outcome: Progressing   Problem: Clinical Measurements: Goal: Ability to maintain clinical measurements within normal limits will improve Outcome: Progressing Goal: Will remain free from infection Outcome: Progressing Goal: Diagnostic test results will improve Outcome: Progressing Goal: Respiratory complications will improve Outcome: Progressing Goal: Cardiovascular complication will  be avoided Outcome: Progressing   Problem: Activity: Goal: Risk for activity intolerance will decrease Outcome: Progressing   Problem: Nutrition: Goal: Adequate nutrition will be maintained Outcome: Progressing   Problem: Coping: Goal: Level of anxiety will decrease Outcome: Progressing   Problem: Elimination: Goal: Will not experience complications related to bowel motility Outcome: Progressing Goal: Will not experience complications related to urinary retention Outcome: Progressing   Problem: Pain Managment: Goal: General experience of comfort will improve and/or be controlled Outcome: Progressing   Problem: Safety: Goal: Ability to remain free from injury will improve Outcome: Progressing   Problem: Skin Integrity: Goal: Risk for impaired skin integrity will decrease Outcome: Progressing   Problem: Education: Goal: Knowledge of disease or condition will improve Outcome: Progressing Goal: Understanding of medication regimen will improve Outcome: Progressing Goal: Individualized Educational Video(s) Outcome: Progressing   Problem: Activity: Goal: Ability to tolerate increased activity will improve Outcome: Progressing   Problem: Cardiac: Goal: Ability to achieve and maintain adequate cardiopulmonary perfusion will improve Outcome: Progressing   Problem: Health Behavior/Discharge Planning: Goal: Ability to safely manage health-related needs after discharge will improve Outcome: Progressing

## 2024-02-21 NOTE — Procedures (Signed)
 I was present at the procedure, reviewed the HD regimen and made appropriate changes.   Myer Fret MD  [facility] 02/21/2024, 6:22 PM

## 2024-02-21 NOTE — Progress Notes (Signed)
 PROGRESS NOTE ELSIE SAKUMA  FMW:[id number] DOB: May 15, 1956 DOA: 02/20/2024 PCP: Adella Norris, MD  Brief Narrative/Hospital Course: 68 year old male with ESRD on HD TTS HTN diabetes on diet control, history of MSSA bacteremia presented with tachycardia and weakness from dialysis and found to have narrow complex tachycardia cardiology was consulted placed on adenosine  x 2 and then Cardizem  drip and admitted for further management  Subjective: Patient seen and examined this morning He is resting comfortably He is in normal sinus rhythm Overnight afebrile BP stable off Cardizem  drip and on metoprolol  heart rate control  Assessment and plan:  Narrow complex tachycardia likely atypical a flutter: Appreciate cardiology input, heart rate stable on oral metoprolol , IV heparin .  EF decreased on echo.  Check TSH Monitor on telemetry continue plan per cardiology  Acute systolic dysfunction: CAD history: EF is low but in the setting of tachycardia, noted previous plan for PET stress test as outpatient which patient did not follow-up Defer plan to cardiology  ESRD on HD TTS Hyperkalemia Metabolic bone disease: Consult nephrology for dialysis.  Repeat labs pending for hyperkalemia.  Resume sevelamer  Nephrology has been notified Recent Labs    09/30/23 0921 02/20/24 1340  BUN 84* 55*  CREATININE 9.67* 7.93*  CO2 19* 24  K CANCELED 5.9*    Hypothyroidism: Resumed home Synthroid .  Checking TSH-up at 9.7, check free T4  Anemia of chronic kidney disease: Hemoglobin stable baseline around 8 to 9 g.  Monitor  Thrombocytopenia chronic: Previously platelet back in February was in the 80s currently in 111, monitor  Diabetes mellitus with hyperlipidemia: DM is diet controlled, on SSI here.  Resume statin.  DVT prophylaxis: Heparin  drip Code Status:   Code Status: Full Code Family Communication: plan of care discussed with patient at bedside. Patient status is: Remains  hospitalized because of severity of illness Level of care: Telemetry Medical   Dispo: The patient is from: home            Anticipated disposition: TBD Objective: Vitals last 24 hrs: Vitals:   02/21/24 0230 02/21/24 0300 02/21/24 0541 02/21/24 0844  BP: (!) 105/51 (!) 93/50 118/65 120/62  Pulse:   71 74  Resp:   17 16  Temp: 98 F (36.7 C)  97.7 F (36.5 C) 98.1 F (36.7 C)  TempSrc: Oral   Oral  SpO2: 97% 100% 97% 97%  Weight:   68.4 kg   Height:        Physical Examination: General exam: alert awake, oriented at baseline, older than stated age HEENT:Oral mucosa moist, Ear/Nose WNL grossly Respiratory system: Bilaterally clear BS,no use of accessory muscle Cardiovascular system: S1 & S2 +, No JVD. Gastrointestinal system: Abdomen soft,NT,ND, BS+ Nervous System: Alert, awake, moving all extremities,and following commands. Extremities: LE edema neg,distal peripheral pulses palpable and warm.  Skin: No rashes,no icterus. MSK: Normal muscle bulk,tone, power   Data Reviewed: I have personally reviewed following labs and imaging studies ( see epic result tab) CBC: Recent Labs  Lab 02/20/24 1340 02/21/24 0818  WBC 6.8 5.5  NEUTROABS 4.4  --   HGB 10.0* 9.9*  HCT 30.5* 30.4*  MCV 95.6 95.0  PLT 111* 114*   CMP: Recent Labs  Lab 02/20/24 1340  NA 135  K 5.9*  CL 95*  CO2 24  GLUCOSE 116*  BUN 55*  CREATININE 7.93*  CALCIUM  8.9  MG 2.5*  PHOS 5.8*   GFR: Estimated Creatinine Clearance: 7.5 mL/min (A) (by C-G formula based on SCr of 7.93 mg/dL (  H)). No results for input(s): AST, ALT, ALKPHOS, BILITOT, PROT, ALBUMIN  in the last 168 hours. No results for input(s): LIPASE, AMYLASE in the last 168 hours. No results for input(s): AMMONIA in the last 168 hours. Coagulation Profile: No results for input(s): INR, PROTIME in the last 168 hours. Unresulted Labs (From admission, onward)     Start     Ordered   02/22/24 0500  Basic metabolic panel  with GFR  Daily,   R      02/21/24 0837   02/22/24 0500  CBC  Daily,   R      02/21/24 0837   02/22/24 0500  T4, free  Tomorrow morning,   R        02/21/24 1000   02/21/24 0500  Heparin  level (unfractionated)  Daily,   R      02/20/24 1705   02/21/24 0500  Comprehensive metabolic panel  Tomorrow morning,   R        02/20/24 1816   02/21/24 0500  HIV Antibody (routine testing w rflx)  Once,   R        02/21/24 0500           Antimicrobials/Microbiology: Anti-infectives (From admission, onward)    None         Component Value Date/Time   SDES ABSCESS 03/19/2021 1709   SPECREQUEST LEFT PSOAS 03/19/2021 1709   CULT  03/19/2021 1709    FEW STAPHYLOCOCCUS AUREUS NO ANAEROBES ISOLATED Performed at [facility], [address], [place], [place] [address]    REPTSTATUS 03/24/2021 FINAL 03/19/2021 1709     Medications reviewed:  Scheduled Meds:  atorvastatin   20 mg Oral QHS   insulin  aspart  0-15 Units Subcutaneous TID WC   [START ON 02/22/2024] levothyroxine   88 mcg Oral QAC breakfast   metoprolol  tartrate  25 mg Oral TID   sevelamer  carbonate  1,600 mg Oral TID WC   sodium chloride  flush  3 mL Intravenous Q12H   sodium chloride  flush  3-10 mL Intravenous Q12H   Continuous Infusions:  heparin  1,100 Units/hr (02/20/24 1845)    Mennie LAMY, MD [facility] 02/21/2024, 10:00 AM

## 2024-02-21 NOTE — Progress Notes (Signed)
 [facility] night cross cover note:   I followed up on this patient's blood pressure and HR. After undergoing scheduled hemodialysis today in which there was removal of 3 L of fluid, the patient has now received a total of 2 liters of IVF (1 liter bolus followed by 500 cc IVF boluses x 2) and has also received calcium  gluconate 1 g IV x 2 doses.  however, in spite of these interventions, the patient remains hypotensive with systolic blood pressures in the 80s, MAPs less than 65 mmHg as well as bradycardic, with heart rates in the 40s, with RN conveying highest recent heart rate of 48 bpm.    After some initial improvement in systolic blood pressure into the 90s following 1 liter bolus, the patient's ensuing SBP has been trending down into spite of adminstration of 500 cc LR boluses x 2, with most recent BP noted to be 87/43 with MAP 58. He is noted to be awake and alert, complaining of some nausea, but otherwise asymptomatic.  LA found to be elevated at 2.8.   However, I am hesitant to provide additional aggressive IV fluids for either his BP or lactic acidosis given his history of ESRD on HD as well as most recent LVEF 35 to 40% per echo performed yesterday. Cardiology has consulted and continues to follow. I've ordered repeat EKG in the setting of the patient's ongoing bradycardia/hypotension.  In the setting of the patient's refractory hypotension and bradycardia, I have discussed patient's case with on-call PCCM, Dr. Loreli, who, after evaluating the patient, has accepted the patient onto the critical care service, and the patient has been subsequently transferred into the ICU for further evaluation and management.    Eva Pore, DO Hospitalist

## 2024-02-21 NOTE — Progress Notes (Signed)
 ANTICOAGULATION CONSULT NOTE  Pharmacy Consult for Heparin  Indication: atrial fibrillation  No Known Allergies  Patient Measurements: Height: 5' 2 (157.5 cm) Weight: 68.3 kg (150 lb 9.2 oz) IBW/kg (Calculated) : 54.6 Heparin  Dosing Weight: 68.3 kg  Vital Signs: Temp: 97.7 F (36.5 C) (07/11 1938) Temp Source: Oral (07/11 1742) BP: 130/71 (07/11 1938) Pulse Rate: 81 (07/11 1938)  Labs: Recent Labs    02/20/24 1340 02/20/24 1707 02/21/24 0112  HGB 10.0*  --   --   HCT 30.5*  --   --   PLT 111*  --   --   HEPARINUNFRC  --   --  0.47  CREATININE 7.93*  --   --   TROPONINIHS 25* 25*  --     Estimated Creatinine Clearance: 7.5 mL/min (A) (by C-G formula based on SCr of 7.93 mg/dL (H)).   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  Did not get outpatient stress testing following hospitalization.  No chest pain since   Chronic kidney disease    on dialysis Tues, Thurs and Sat   Diabetes mellitus without complication (HCC)    Diabetes type 2, uncontrolled 11/25/2011   no meds, diet controlled per patient   Diabetic gastroparesis (HCC) 01/22/2017   Hypertension    no meds   Microalbuminuria 01/19/2017   Myocardial infarction [facility]) 2015    Medications:  Medications Prior to Admission  Medication Sig Dispense Refill Last Dose/Taking   acetaminophen  (TYLENOL ) 325 MG tablet Take 2 tablets (650 mg total) by mouth every 6 (six) hours as needed for mild pain (or Fever >/= 101).   Unknown   atorvastatin  (LIPITOR) 20 MG tablet TAKE 1 TABLET BY MOUTH DAILY WITH THE EVENING MEAL (Patient taking differently: Take 20 mg by mouth at bedtime.) 30 tablet 11 02/19/2024   levothyroxine  (SYNTHROID ) 88 MCG tablet TAKE 1 TABLET(88 MCG) BY MOUTH DAILY (Patient taking differently: Take 88 mcg by mouth daily  before breakfast.) 30 tablet 11 02/19/2024   sevelamer  carbonate (RENVELA ) 800 MG tablet Take 1,600 mg by mouth 3 (three) times daily.   02/18/2024   Darbepoetin Alfa  (ARANESP ) 60 MCG/0.3ML SOSY injection Inject 0.3 mLs (60 mcg total) into the vein every Friday with hemodialysis. (Patient not taking: Reported on 02/20/2024) 4.2 mL  Not Taking   doxercalciferol  (HECTOROL ) 4 MCG/2ML injection Inject 2 mLs (4 mcg total) into the vein every Monday, Wednesday, and Friday with hemodialysis. (Patient not taking: Reported on 02/20/2024) 2 mL  Not Taking   pentafluoroprop-tetrafluoroeth (GEBAUERS) AERO Apply 1 application topically as needed (topical anesthesia for hemodialysis). (Patient not taking: Reported on 02/20/2024)  0 Not Taking   Scheduled:   insulin  aspart  0-15 Units Subcutaneous TID WC   metoprolol  tartrate  25 mg Oral TID   sodium chloride  flush  3 mL Intravenous Q12H   sodium chloride  flush  3-10 mL Intravenous Q12H   Infusions:   diltiazem  (CARDIZEM ) infusion 5 mg/hr (02/20/24 1700)   heparin  1,100 Units/hr (02/20/24 1845)   PRN: acetaminophen  **OR** acetaminophen , morphine  injection, sodium chloride  flush  Assessment: 68 yom with a history of ESRD, hypertension, T2DM, MSSA bacteremia. Patient is presenting with weakness. Heparin  per pharmacy consult placed for atrial fibrillation.  Patient is not on anticoagulation prior to arrival.  Hgb 10; plt 111  7/12  AM update:  Heparin  level therapeutic   Goal of Therapy:  Heparin  level 0.3-0.7 units/ml Monitor platelets by anticoagulation protocol: Yes   Plan:  Cont heparin  1100 units/hr Heparin  level with AM labs  Lynwood Mckusick, PharmD, BCPS Clinical Pharmacist Phone: [phone]

## 2024-02-21 NOTE — Progress Notes (Signed)
 ANTICOAGULATION CONSULT NOTE  Pharmacy Consult for Heparin  Indication: atrial fibrillation  No Known Allergies  Patient Measurements: Height: 5' 2 (157.5 cm) Weight: 68.4 kg (150 lb 12.7 oz) IBW/kg (Calculated) : 54.6 Heparin  Dosing Weight: 68.3 kg  Vital Signs: Temp: 98.1 F (36.7 C) (07/12 0844) Temp Source: Oral (07/12 0844) BP: 120/62 (07/12 0844) Pulse Rate: 74 (07/12 0844)  Labs: Recent Labs    02/20/24 1340 02/20/24 1707 02/21/24 0112 02/21/24 0818  HGB 10.0*  --   --  9.9*  HCT 30.5*  --   --  30.4*  PLT 111*  --   --  114*  HEPARINUNFRC  --   --  0.47 0.55  CREATININE 7.93*  --   --  9.33*  TROPONINIHS 25* 25*  --   --     Estimated Creatinine Clearance: 6.4 mL/min (A) (by C-G formula based on SCr of 9.33 mg/dL (H)).   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  Did not get outpatient stress testing following hospitalization.  No chest pain since   Chronic kidney disease    on dialysis Tues, Thurs and Sat   Diabetes mellitus without complication (HCC)    Diabetes type 2, uncontrolled 11/25/2011   no meds, diet controlled per patient   Diabetic gastroparesis (HCC) 01/22/2017   Hypertension    no meds   Microalbuminuria 01/19/2017   Myocardial infarction (HCC) 2015    Medications:  Medications Prior to Admission  Medication Sig Dispense Refill Last Dose/Taking   acetaminophen  (TYLENOL ) 325 MG tablet Take 2 tablets (650 mg total) by mouth every 6 (six) hours as needed for mild pain (or Fever >/= 101).   Unknown   atorvastatin  (LIPITOR) 20 MG tablet TAKE 1 TABLET BY MOUTH DAILY WITH THE EVENING MEAL (Patient taking differently: Take 20 mg by mouth at bedtime.) 30 tablet 11 02/19/2024   levothyroxine  (SYNTHROID ) 88 MCG tablet TAKE 1 TABLET(88 MCG) BY MOUTH DAILY (Patient  taking differently: Take 88 mcg by mouth daily before breakfast.) 30 tablet 11 02/19/2024   sevelamer  carbonate (RENVELA ) 800 MG tablet Take 1,600 mg by mouth 3 (three) times daily.   02/18/2024   pentafluoroprop-tetrafluoroeth (GEBAUERS) AERO Apply 1 application topically as needed (topical anesthesia for hemodialysis). (Patient not taking: Reported on 02/20/2024)  0 Not Taking   Scheduled:   atorvastatin   20 mg Oral QHS   Chlorhexidine  Gluconate Cloth  6 each Topical Q0600   insulin  aspart  0-15 Units Subcutaneous TID WC   [START ON 02/22/2024] levothyroxine   88 mcg Oral QAC breakfast   metoprolol  tartrate  25 mg Oral TID   sevelamer  carbonate  1,600 mg Oral TID WC   sodium chloride  flush  3 mL Intravenous Q12H   sodium chloride  flush  3-10 mL Intravenous Q12H   Infusions:   heparin  1,100 Units/hr (02/20/24 1845)   PRN: acetaminophen  **OR** acetaminophen , morphine  injection, sodium chloride  flush  Assessment: 68 YO M with a history of ESRD, hypertension, T2DM, MSSA bacteremia. Patient is presenting with weakness. Heparin  per pharmacy consult placed for atrial fibrillation. -Patient not on anticoagulation PTA. -CBC stable -Heparin  therapeutic at 0.55 on 7/12 08:18; has been therapeutic x 2 levels.  Goal of Therapy:  Heparin  level 0.3-0.7 units/ml Monitor platelets by anticoagulation protocol: Yes   Plan:  Cont heparin   1100 units/hr Monitor CBC, heparin  level with AM labs  Maurilio Patten, PharmD PGY1 Pharmacy Resident [facility]  02/21/2024 11:26 AM

## 2024-02-21 NOTE — Progress Notes (Signed)
 Pt returned from dialysis. HR in the 30s. BP 59/34. Primary MD Christobal Guadalajara paged as well as cardiology team. This RN went to room and noticed Cardizem  gtt was infusing at 50mL/hr. Gtt was immediately removed at 1730. Rapid RN called as well and came to bedside. 1L NS bolus started. Atropine  given with Dr Raenelle at bedside. Calcium  gluconate 1g also given with Dr Raenelle at bedside.

## 2024-02-21 NOTE — Progress Notes (Signed)
 Progress Note  Patient Name: LUISFERNANDO BRIGHTWELL Date of Encounter: 02/21/2024 [facility] Cardiologist: Lonni LITTIE Nanas, MD   Interval Summary   NAEO, rates slowed, dilt stopped.  Vital Signs Vitals:   02/21/24 0230 02/21/24 0300 02/21/24 0541 02/21/24 0844  BP: (!) 105/51 (!) 93/50 118/65 120/62  Pulse:   71 74  Resp:   17 16  Temp: 98 F (36.7 C)  97.7 F (36.5 C) 98.1 F (36.7 C)  TempSrc: Oral   Oral  SpO2: 97% 100% 97% 97%  Weight:   68.4 kg   Height:        Intake/Output Summary (Last 24 hours) at 02/21/2024 1206 Last data filed at 02/21/2024 0400 Gross per 24 hour  Intake 826.8 ml  Output --  Net 826.8 ml      02/21/2024    5:41 AM 02/20/2024    1:14 PM 09/23/2023   10:24 AM  Last 3 Weights  Weight (lbs) 150 lb 12.7 oz 150 lb 9.2 oz 143 lb  Weight (kg) 68.4 kg 68.3 kg 64.864 kg      Telemetry/ECG  Looks like atrial flutter rate 80s - Personally Reviewed  Physical Exam  GEN: No acute distress.   Neck: No JVD Cardiac: RRR, no murmurs, rubs, or gallops.  Respiratory: Clear to auscultation bilaterally. GI: Soft, nontender, non-distended  MS: No edema  Assessment & Plan  #atypical atrial flutter - looks like flutter on telemetry. Waiting for EKG which has not yet been performed, ordered x 2. Need to confirm rhythm, hard to tell on tele but looks like flutter waves - cont on metoprolol  25 mg TID.  - continue on IV heparin , when rhythm confirmed would switch to doac to faciliate tee/dccv possibly Monday.   Acute HFrEF - no HF signs.  - EF down, may be due to HR being 140. Will repeat echo to confirm now that rate better. Will consider GDMT after confirmation of EF.    For questions or updates, please contact [facility] Please consult [email or web address] for contact info under       Signed, Darion Milewski A Andelyn Spade, MD

## 2024-02-21 NOTE — Progress Notes (Signed)
 Heart rate has dropped into the high 40s. Patient is asymptomatic. Dr Mennie has been made aware. EKG has been ordered. Metoprolol  has been adjusted.

## 2024-02-21 NOTE — Hospital Course (Addendum)
 68 year old male with ESRD on HD TTS HTN diabetes on diet control, history of MSSA bacteremia presented with tachycardia and weakness from dialysis and found to have narrow complex tachycardia cardiology was consulted placed on adenosine  x 2 and then Cardizem  drip and admitted for further management.  Patient was placed on Cardizem  drip for a flutter with RVR, seen by cardiology.  Had bradycardia and hypotension, taken off Cardizem  given volume resuscitated, along with atropine  x 1, calcium  gluconate-was transferred to ICU for close monitoring blood did not need peripheral pressors.  Per nursing received for few seconds-minute and stopped. Patient remains sinus bradycardia hemodynamically stable, heparin  being changed to Eliquis  and plan for discharge after dialysis with does well.  Subjective: Patient seen examined this morning eager to go home after dialysis today Complains of stomach upset from all the medication I am getting Some loose stool but not watery Heart rate in 50s, BP stable in sinus bradycardia  Discharge diagnosis:  Narrow complex tachycardia likely atypical a flutter Bradycardia/hypotension due to Cardizem  and hypovolemia Lactic acidosis due to hypotension Symptomatic bradycardia: Cardiology following.  Admitted on Cardizem  drip with plan to transition to p.o. metoprolol  and on IV heparin . Following dialysis with removal of extra fluids below the dry weight became hypovolemic and also Cardizem  was running> subsequently it was discontinued  and s/p 2 L IVF bolus, calcium  chloride 2 gm > BP was soft, transferred to ICU overnight but did not need much vasopressors (for few mins-seconds only per RN).  Remains hemodynamically stable. Avoid rate controlling agent, transitioned heparin  to Eliquis . Initial echo showed EF 35-40% while heart rate in 140s> eart rate stable, repeat echo shows EF up at 50%. Cardiology signed off.   Acute systolic dysfunction: CAD history: EF is low but in  the setting of tachycardia, appears to be improved now on repeat echo.  Noted previous plan for PET stress test as outpatient which patient did not follow-up  ESRD on HD  Hyperkalemia Metabolic bone disease: Nephrology input appreciated got HD 7/12, potassium on higher side,advised low k diet. HD today. Cont  sevelamer .  Recent Labs    09/30/23 0921 02/20/24 1340 02/21/24 0818 02/21/24 2032 02/22/24 0233 02/23/24 0311  BUN 84* 55* 69* 32* 35* 56*  CREATININE 9.67* 7.93* 9.33* 5.24* 5.67* 7.28*  CO2 19* 24 24 23 24  21*  K CANCELED 5.9* 6.9* 4.8 5.6* 5.8*    Hypothyroidism: TSH elevated 9.7 but free T4 normal continue home Synthroid  , needs recheck in 3 to 4 weeks to adjust  Anemia of chronic kidney disease: Stable  Thrombocytopenia chronic: Previously platelet back in February was in the 80s currently in 100s  Diabetes mellitus with hyperlipidemia: diet controlled DM, cont ssi cont statin.

## 2024-02-21 NOTE — Consult Note (Incomplete)
 NAME:  Ryan Wells, MRN:  [id number], DOB:  16-Jan-1956, LOS: 0 ADMISSION DATE:  02/20/2024, CONSULTATION DATE:  02/21/24 REFERRING MD:  Mennie LAMY, CHIEF COMPLAINT:  atrial flutter   History of Present Illness:  Ryan Wells is a 68 y.o. M with PMH significant for ESRD, Type 2 DM, HTN, gastroparesis, MSSA bacteremia who presented with tachycardia.  Had a recent episode of near syncope following HD and saw cardiology who ordered a zio patch, though patient had not completed the study.  He was seen by cardiology and thought to be in Atrial Flutter and was started on Metoprolol  and Cardizem .   Echo showed reduced EF of 35-40% and mildly reduced RV systolic function.   His HR improved and cardizem  gtt was discontinued, then resumed during dialysis today despite HR not being elevated.  He had three L removed during HD.  On transfer back to the floor became hypotensive, cardizem  was then discontinued.  He has since had persistent hypotension with MAP's now in the 50's despite 2L IVF, 2g Ca gluconate.  He is still awake and mentating though is having some L leg spasming.  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   Significant Hospital Events: Including procedures, antibiotic start and stop dates in addition to other pertinent events   7/11 admit with a flutter 7/12 cardizem  induced hypotension and bradycardia, ICU transfer   Interim History / Subjective:  As abone   Objective    Blood pressure (!) 85/36, pulse 68, temperature (!) 97.4 F (36.3 C), temperature source Oral, resp. rate 16,  height 5' 2 (1.575 m), weight 65.2 kg, SpO2 100%.        Intake/Output Summary (Last 24 hours) at 02/21/2024 2325 Last data filed at 02/21/2024 1846 Gross per 24 hour  Intake 1552.39 ml  Output --  Net 1552.39 ml   Filed Weights   02/20/24 1314 02/21/24 0541 02/21/24 1743  Weight: 68.3 kg 68.4 kg 65.2 kg    General:   HEENT: MM pink/moist Neuro:  CV: s1s2 ***, no m/r/g PULM:  *** GI: soft, bsx4 active  Extremities: warm/dry, *** edema  Skin: no rashes or lesions   Resolved problem list   Assessment and Plan    Best Practice (right click and Reselect all SmartList Selections daily)   Diet/type: {diet type:25684} DVT prophylaxis {anticoagulation:25687} Pressure ulcer(s): {pressure ulcer(s):31683} GI prophylaxis: {HP:73065} Lines: {Central Venous Access:25771} Foley:  {Central Venous Access:25691} Code Status:  {Code Status:26939} Last date of multidisciplinary goals of care discussion [***]  Labs   CBC: Recent Labs  Lab 02/20/24 1340 02/21/24 0818  WBC 6.8 5.5  NEUTROABS 4.4  --   HGB 10.0* 9.9*  HCT 30.5* 30.4*  MCV 95.6 95.0  PLT 111* 114*    Basic Metabolic Panel: Recent Labs  Lab 02/20/24 1340 02/21/24 0818 02/21/24 2032  NA 135 135 135  K 5.9* 6.9* 4.8  CL 95* 95* 98  CO2 24 24 23   GLUCOSE 116* 116* 114*  BUN 55* 69* 32*  CREATININE 7.93* 9.33* 5.24*  CALCIUM  8.9 8.8* 8.3*  MG 2.5*  --   --   PHOS 5.8*  --   --    GFR:  Estimated Creatinine Clearance: 10.3 mL/min (A) (by C-G formula based on SCr of 5.24 mg/dL (H)). Recent Labs  Lab 02/20/24 1340 02/21/24 0818 02/21/24 2032  WBC 6.8 5.5  --   LATICACIDVEN  --   --  2.8*    Liver Function Tests: Recent Labs  Lab 02/21/24 0818  AST 11*  ALT 14  ALKPHOS 103  BILITOT 1.2  PROT 7.2  ALBUMIN  3.7   No results for input(s): LIPASE, AMYLASE in the last 168 hours. No results for input(s): AMMONIA in the last 168 hours.  ABG    Component Value Date/Time   HCO3 17.4 (L)  01/16/2021 0459   TCO2 25 01/15/2021 1342   ACIDBASEDEF 8.1 (H) 01/16/2021 0459   O2SAT 52.5 01/16/2021 0459     Coagulation Profile: No results for input(s): INR, PROTIME in the last 168 hours.  Cardiac Enzymes: No results for input(s): CKTOTAL, CKMB, CKMBINDEX, TROPONINI in the last 168 hours.  HbA1C: Hgb A1c MFr Bld  Date/Time Value Ref Range Status  09/30/2023 09:21 AM 6.0 (H) 4.8 - 5.6 % Final    Comment:             Prediabetes: 5.7 - 6.4          Diabetes: >6.4          Glycemic control for adults with diabetes: <7.0   04/17/2023 09:08 AM 6.1 (H) 4.8 - 5.6 % Final    Comment:             Prediabetes: 5.7 - 6.4          Diabetes: >6.4          Glycemic control for adults with diabetes: <7.0     CBG: Recent Labs  Lab 02/21/24 0143 02/21/24 0930 02/21/24 1126 02/21/24 1730 02/21/24 2104  GLUCAP 100* 110* 113* 120* 109*    Review of Systems:   ***  Past Medical History:  He,  has a past medical history of 3rd nerve palsy, partial, 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   Surgical History:   Past Surgical History:  Procedure Laterality Date  . A/V FISTULAGRAM Left 06/04/2019   Procedure: A/V FISTULAGRAM;  Surgeon: Eliza Lonni RAMAN, MD;  Location: [facility] INVASIVE CV LAB;  Service: Cardiovascular;  Laterality: Left;  . A/V FISTULAGRAM Left 10/15/2019   Procedure: A/V FISTULAGRAM;  Surgeon: Eliza Lonni RAMAN, MD;  Location: [facility] INVASIVE CV LAB;  Service: Cardiovascular;  Laterality: Left;  . A/V FISTULAGRAM N/A 01/24/2021   Procedure: A/V FISTULAGRAM - Left Upper;  Surgeon: Magda Debby SAILOR, MD;  Location: [facility] INVASIVE CV LAB;  Service: Cardiovascular;  Laterality: N/A;  .  AV FISTULA PLACEMENT Left 07/02/2018   Procedure: BRACHIOCEPHALIC ARTERIOVENOUS (AV) FISTULA CREATION LEFT ARM;  Surgeon: Serene Gaile ORN, MD;  Location: [facility] OR;  Service: Vascular;  Laterality: Left;  . BASCILIC VEIN TRANSPOSITION Left 07/26/2019   Procedure: Bascilic Vein Transposition;  Surgeon: Eliza Lonni RAMAN, MD;  Location: [facility] OR;  Service: Vascular;  Laterality: Left;  . COLONOSCOPY    . EMBOLIZATION (CATH LAB) Left 06/04/2019   Procedure: EMBOLIZATION;  Surgeon: Eliza Lonni RAMAN, MD;  Location: [facility] INVASIVE CV LAB;  Service: Cardiovascular;  Laterality: Left;  LT ARM FISTULA/COMPETING BRANCH  . EYE SURGERY Bilateral    cataracts x2  . EYE SURGERY    .  INSERTION OF DIALYSIS CATHETER Right 06/08/2019   Procedure: INSERTION OF PALINDROME DIALYSIS CATHETER IN RIGHT INTERNAL JUGULAR;  Surgeon: Eliza Lonni RAMAN, MD;  Location: [facility] OR;  Service: Vascular;  Laterality: Right;  . LIGATION OF ARTERIOVENOUS  FISTULA Left 07/26/2019   Procedure: Ligation Of BrachioCephalic  Fistula;  Surgeon: Eliza Lonni RAMAN, MD;  Location: [facility] OR;  Service: Vascular;  Laterality: Left;  . NECK SURGERY    . PERIPHERAL VASCULAR BALLOON ANGIOPLASTY Left 06/04/2019   Procedure: PERIPHERAL VASCULAR BALLOON ANGIOPLASTY;  Surgeon: Eliza Lonni RAMAN, MD;  Location: [facility] INVASIVE CV LAB;  Service: Cardiovascular;  Laterality: Left;  ARM FISTULA  . PERIPHERAL VASCULAR BALLOON ANGIOPLASTY Left 10/15/2019   Procedure: PERIPHERAL VASCULAR BALLOON ANGIOPLASTY;  Surgeon: Eliza Lonni RAMAN, MD;  Location: [facility] INVASIVE CV LAB;  Service: Cardiovascular;  Laterality: Left;  arm fistula  . TEE WITHOUT CARDIOVERSION N/A 01/19/2021   Procedure: TRANSESOPHAGEAL ECHOCARDIOGRAM (TEE);  Surgeon: Jeffrie Oneil BROCKS, MD;  Location: [facility] ENDOSCOPY;  Service: Cardiovascular;  Laterality: N/A;     Social History:   reports that he has quit smoking. He has never used smokeless tobacco. He reports that he does not currently  use alcohol. He reports that he does not use drugs.   Family History:  His family history includes Diabetes in his brother, father, and sister; Heart disease in his mother; Kidney disease in his father. There is no history of Colon cancer, Stomach cancer, or Esophageal cancer.   Allergies No Known Allergies   Home Medications  Prior to Admission medications   Medication Sig Start Date End Date Taking? Authorizing Provider  acetaminophen  (TYLENOL ) 325 MG tablet Take 2 tablets (650 mg total) by mouth every 6 (six) hours as needed for mild pain (or Fever >/= 101). 04/07/21  Yes Ghimire, Kuber, MD  atorvastatin  (LIPITOR) 20 MG tablet TAKE 1 TABLET BY MOUTH DAILY WITH THE EVENING MEAL Patient taking differently: Take 20 mg by mouth at bedtime. 05/23/23  Yes Adella Norris, MD  levothyroxine  (SYNTHROID ) 88 MCG tablet TAKE 1 TABLET(88 MCG) BY MOUTH DAILY Patient taking differently: Take 88 mcg by mouth daily before breakfast. 05/23/23  Yes Adella Norris, MD  sevelamer  carbonate (RENVELA ) 800 MG tablet Take 1,600 mg by mouth 3 (three) times daily. 01/09/24  Yes [provider]  pentafluoroprop-tetrafluoroeth JUANA) AERO Apply 1 application topically as needed (topical anesthesia for hemodialysis). Patient not taking: Reported on 02/20/2024 01/30/21   Katsadouros, Vasilios, MD     Critical care time: ***

## 2024-02-21 NOTE — Significant Event (Signed)
 See previous note: Pt seen again this evening, multifamily nurse at the bedside. Patient is alert awake trying to eat/ and drink. He is being volume resuscitated- finishing 1 l bolus NS and calcium  gluconate. Discussed with nephrology> since patient's dry weight is lower still okay to resuscitate 500 cc NS bolus x 2 PRN  if SBP<90-order placed nursing notified. If remains symptomatic and bradycardic/hypotensive will consider repeating calcium  gluconate Low threshold for ICU transfer if still hypotensive/bradycardic- I did discuss w/ ICU Team [facility], since he is mentating well, and Current BP up in 83/43 and started on 500 cc ns bolus, advised to check labs and reach out to pccm if still hypotensive or bradycardic-I called/signed out to cross covering team Dr Marcene starting 7 pm.

## 2024-02-21 NOTE — Progress Notes (Addendum)
 [facility] night cross cover note:   The patient is currently on diltiazem  drip at a rate of 5 mg/hr. heart rate is now improved relative to initial presenting heart rates sustained in the 140s.  Cardiology has consulted, and the patient has now received 2 doses of oral metoprolol  tartrate per existing order for metoprolol  tartrate 25 mg p.o. 3 times daily, with first dose occurring around 1900 on 02/20/24. As it has been greater than two hours since receipt of oral metoprolol  tartrate, with the patient's HR's now in in the mid 50's to 60's bpm, and with most recent SBP's in the 90's to low 100's mmHg, will discontinue diltiazem  drip at this time, as opposed to weaning at rate of 2.5 mg/hr. Given interval improvement in rate control, I'll also order a repeat EKG.    Update: as the patient is now off the diltiazem  drip, he is able to stay on 2W. I discontinued transfer to pcu , and placed updated order to reflect med-tele status.     Eva Pore, DO Hospitalist

## 2024-02-21 NOTE — Significant Event (Signed)
 Cross covering for Dr. CHRISTOBAL  Was at the unit when a rapid response was called and I attended the patient.  Communicated with primary attending.  Also communicated with cardiology covering. Patient admitted with rapid A-fib, while receiving hemodialysis his heart rate was dropping to 40, he is already on sinus rhythm.  Patient was not obviously symptomatic at the dialysis.  After patient arrived back to the unit bed, he was noted to be lethargic and tired. Blood pressure 59/34 Heart rate 34, once dipped to 28. Unfortunately, patient was receiving Cardizem  drip milligram per hour even after heart rate was controlled and patient was started on metoprolol .  Patient evaluated bedside.  Multiple family members present.  Sinus bradycardia/hypovolemia due to combination of excessive beta-blockers effect/calcium  channel blocker effects and 3 L fluid removal at dialysis today.  -Given one 1 mg of atropine  due to heart rate being less than 30 and heart rate responded. - Calcium  gluconate 1 g intravenous infusion. - 1 L normal saline bolus to replete volume.  His hemodynamics is improving.  Heart rate has improved.  Blood pressures are responding.  Will continue close monitoring and replace volume.  If heart rate drops down again, will need vasopressor and will transfer to ICU if needed.   Critical time spent: 35 minutes.  Case discussed with cardiology attending at the bedside.

## 2024-02-21 NOTE — Progress Notes (Signed)
 [facility] night cross cover note:  Regarding this patient who is admitted at shift change last evening for atrial flutter versus atrial fibrillation with RVR, I was contacted by the Charge Nurse on 2W that the patient remains on diltiazem  drip, which will require a high level of care.   Consequently, I will transfer pt to progressive unit. In order to achieve transfer to progressive unit, I have placed the following 3 orders:     1) order to transfer patient to progressive unit 2) order to change level of care to progressive 3) a new admission order to admit to progressive   I have communicated the above plan to transfer patient to progressive unit with the [facility] Nurse on 2W.     Eva Pore, DO Hospitalist

## 2024-02-21 NOTE — Consult Note (Signed)
 NAME:  Ryan Wells, MRN:  [id number], DOB:  [birth year], LOS: 0 ADMISSION DATE:  02/20/2024, CONSULTATION DATE:  02/21/24 REFERRING MD:  Mennie LAMY, CHIEF COMPLAINT:  atrial flutter   History of Present Illness:  Ryan Wells is a 68 y.o. M with PMH significant for ESRD, Type 2 DM, HTN, gastroparesis, MSSA bacteremia who presented with tachycardia.  Had a recent episode of near syncope following HD and saw cardiology who ordered a zio patch, though patient had not completed the study.  He was seen by cardiology and thought to be in Atrial Flutter and was started on Metoprolol  and Cardizem .   Echo showed reduced EF of 35-40% and mildly reduced RV systolic function.   His HR improved and cardizem  gtt was discontinued, then resumed during dialysis today despite HR not being elevated.  He had three L removed during HD.  On transfer back to the floor became hypotensive, cardizem  was then discontinued.  He has since had persistent hypotension with MAP's now in the 50's despite 2L IVF, 2g Ca gluconate.  He is still awake and mentating though is having some L leg spasming.  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   Significant Hospital Events: Including procedures, antibiotic start and stop dates in addition to other pertinent events   7/11 admit with a flutter 7/12 cardizem  induced hypotension and bradycardia, ICU transfer   Interim History / Subjective:  As abone   Objective    Blood pressure (!) 85/36, pulse 68, temperature (!) 97.4 F (36.3 C), temperature source Oral, resp. rate 16,  height 5' 2 (1.575 m), weight 65.2 kg, SpO2 100%.        Intake/Output Summary (Last 24 hours) at 02/21/2024 2325 Last data filed at 02/21/2024 1846 Gross per 24 hour  Intake 1552.39 ml  Output --  Net 1552.39 ml   Filed Weights   02/20/24 1314 02/21/24 0541 02/21/24 1743  Weight: 68.3 kg 68.4 kg 65.2 kg    General:  well nourished M resting in bed in NAD HEENT: MM pink/moist, sclera anicteric Neuro: alert and oriented, spanish speaking but understands and follows commands CV: s1s2 bradycardic, no m/r/g PULM:  course upper airway sounds bilaterally, no distress on RA  GI: soft, non-tender  Extremities: warm/dry, no edema, L leg with what appears to be spasms, pt denies pain, no edema or TTP   Resolved problem list   Assessment and Plan   Calcium  Channel induced hypotension and bradycardia New onset Atrial Flutter Cardizem  and Metoprolol  discontinued -received IVF and 2g Ca gluconate -transfer to ICU and start peripheral norepi -monitor electrolytes and replace prn -repeat echo pending -Cardiology following, continue heparin  gtt  Type 2 DM -glucose at goal 140-180 on SSI  History of Hypothyroidism -TSH elevated, T4 pending -continue synthroid   Chronic Thrombocytopenia  Platelets stable at 114 -trend CBC, no signs of bleeding on heparin   Best Practice (right click and Reselect all SmartList Selections daily)   Diet/type: Regular consistency (see orders) DVT prophylaxis systemic heparin  Pressure ulcer(s): N/A GI prophylaxis: N/A Lines: N/A Foley:  N/A Code Status:  full code Last date of multidisciplinary goals of care discussion [pending]  Labs   CBC: Recent Labs  Lab 02/20/24 1340 02/21/24 0818  WBC 6.8 5.5  NEUTROABS 4.4  --   HGB 10.0* 9.9*  HCT 30.5* 30.4*  MCV 95.6 95.0  PLT 111* 114*    Basic Metabolic Panel: Recent Labs  Lab 02/20/24 1340 02/21/24 0818 02/21/24 2032  NA 135 135 135  K 5.9* 6.9* 4.8  CL 95* 95* 98  CO2 24 24 23    GLUCOSE 116* 116* 114*  BUN 55* 69* 32*  CREATININE 7.93* 9.33* 5.24*  CALCIUM  8.9 8.8* 8.3*  MG 2.5*  --   --   PHOS 5.8*  --   --    GFR: Estimated Creatinine Clearance: 10.3 mL/min (A) (by C-G formula based on SCr of 5.24 mg/dL (H)). Recent Labs  Lab 02/20/24 1340 02/21/24 0818 02/21/24 2032  WBC 6.8 5.5  --   LATICACIDVEN  --   --  2.8*    Liver Function Tests: Recent Labs  Lab 02/21/24 0818  AST 11*  ALT 14  ALKPHOS 103  BILITOT 1.2  PROT 7.2  ALBUMIN  3.7   No results for input(s): LIPASE, AMYLASE in the last 168 hours. No results for input(s): AMMONIA in the last 168 hours.  ABG    Component Value Date/Time   HCO3 17.4 (L) 01/16/2021 0459   TCO2 25 01/15/2021 1342   ACIDBASEDEF 8.1 (H) 01/16/2021 0459   O2SAT 52.5 01/16/2021 0459     Coagulation Profile: No results for input(s): INR, PROTIME in the last 168 hours.  Cardiac Enzymes: No results for input(s): CKTOTAL, CKMB, CKMBINDEX, TROPONINI in the last 168 hours.  HbA1C: Hgb A1c MFr Bld  Date/Time Value Ref Range Status  09/30/2023 09:21 AM 6.0 (H) 4.8 - 5.6 % Final    Comment:             Prediabetes: 5.7 - 6.4          Diabetes: >6.4          Glycemic control for adults with diabetes: <7.0   04/17/2023 09:08 AM 6.1 (H) 4.8 - 5.6 % Final    Comment:             Prediabetes: 5.7 - 6.4          Diabetes: >6.4          Glycemic control for adults with diabetes: <7.0     CBG: Recent Labs  Lab 02/21/24 0143 02/21/24 0930 02/21/24 1126 02/21/24 1730 02/21/24 2104  GLUCAP 100* 110* 113* 120* 109*    Review of Systems:   Please see the history of present illness. 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   Surgical History:   Past Surgical History:  Procedure Laterality Date   A/V FISTULAGRAM Left 06/04/2019   Procedure: A/V FISTULAGRAM;  Surgeon: Eliza Lonni RAMAN, MD;  Location: [facility] INVASIVE CV LAB;  Service: Cardiovascular;  Laterality: Left;   A/V FISTULAGRAM Left 10/15/2019   Procedure: A/V FISTULAGRAM;  Surgeon: Eliza Lonni RAMAN, MD;  Location: [facility] INVASIVE CV LAB;  Service: Cardiovascular;  Laterality: Left;   A/V FISTULAGRAM N/A 01/24/2021   Procedure: A/V FISTULAGRAM - Left Upper;  Surgeon: Magda Debby SAILOR, MD;  Location: [facility] INVASIVE CV LAB;  Service: Cardiovascular;  Laterality: N/A;   AV FISTULA PLACEMENT Left 07/02/2018   Procedure: BRACHIOCEPHALIC ARTERIOVENOUS (AV) FISTULA CREATION LEFT ARM;  Surgeon: Serene Gaile ORN, MD;  Location: [facility] OR;  Service: Vascular;  Laterality: Left;   BASCILIC VEIN TRANSPOSITION Left 07/26/2019   Procedure: Bascilic Vein Transposition;  Surgeon: Eliza Lonni RAMAN, MD;  Location: [facility] OR;  Service: Vascular;  Laterality: Left;   COLONOSCOPY     EMBOLIZATION (CATH LAB) Left 06/04/2019   Procedure: EMBOLIZATION;  Surgeon: Eliza Lonni RAMAN, MD;  Location: [facility] INVASIVE CV LAB;  Service: Cardiovascular;  Laterality: Left;  LT ARM FISTULA/COMPETING BRANCH   EYE SURGERY Bilateral    cataracts x2   EYE SURGERY     INSERTION OF DIALYSIS CATHETER Right 06/08/2019   Procedure: INSERTION OF PALINDROME DIALYSIS CATHETER IN RIGHT INTERNAL JUGULAR;  Surgeon: Eliza Lonni RAMAN, MD;  Location: [facility] OR;  Service: Vascular;  Laterality: Right;   LIGATION OF ARTERIOVENOUS  FISTULA Left 07/26/2019   Procedure: Ligation Of BrachioCephalic  Fistula;  Surgeon: Eliza Lonni RAMAN, MD;  Location: [facility] OR;  Service: Vascular;  Laterality: Left;   NECK SURGERY     PERIPHERAL VASCULAR BALLOON ANGIOPLASTY Left 06/04/2019    Procedure: PERIPHERAL VASCULAR BALLOON ANGIOPLASTY;  Surgeon: Eliza Lonni RAMAN, MD;  Location: [facility] INVASIVE CV LAB;  Service: Cardiovascular;  Laterality: Left;  ARM FISTULA   PERIPHERAL VASCULAR BALLOON ANGIOPLASTY Left 10/15/2019   Procedure: PERIPHERAL VASCULAR BALLOON ANGIOPLASTY;  Surgeon: Eliza Lonni RAMAN, MD;  Location: [facility] INVASIVE CV LAB;  Service: Cardiovascular;  Laterality: Left;  arm fistula   TEE WITHOUT CARDIOVERSION N/A 01/19/2021   Procedure: TRANSESOPHAGEAL ECHOCARDIOGRAM (TEE);  Surgeon: Jeffrie Oneil BROCKS, MD;  Location: [facility] ENDOSCOPY;  Service: Cardiovascular;  Laterality: N/A;     Social History:   reports that he has quit smoking. He has never used smokeless tobacco. He reports that he does not currently use alcohol. He reports that he does not use drugs.   Family History:  His family history includes Diabetes in his brother, father, and sister; Heart disease in his mother; Kidney disease in his father. There is no history of Colon cancer, Stomach cancer, or Esophageal cancer.   Allergies No Known Allergies   Home Medications  Prior to Admission medications   Medication Sig Start Date End Date Taking? Authorizing Provider  acetaminophen  (TYLENOL ) 325 MG tablet Take 2 tablets (650 mg total) by mouth every 6 (six) hours as needed for mild pain (or Fever >/= 101). 04/07/21  Yes Ghimire, Kuber, MD  atorvastatin  (LIPITOR) 20 MG tablet TAKE 1 TABLET BY MOUTH DAILY WITH THE EVENING MEAL Patient taking differently: Take 20 mg by mouth at bedtime. 05/23/23  Yes Adella Norris, MD  levothyroxine  (SYNTHROID ) 88 MCG tablet TAKE 1 TABLET(88 MCG) BY MOUTH DAILY Patient taking differently: Take 88 mcg by mouth daily before breakfast. 05/23/23  Yes Adella Norris, MD  sevelamer  carbonate (RENVELA ) 800 MG tablet Take 1,600 mg by mouth 3 (three) times daily. 01/09/24  Yes [provider]  pentafluoroprop-tetrafluoroeth JUANA) AERO Apply 1 application topically  as needed (topical anesthesia for hemodialysis). Patient not taking: Reported on 02/20/2024 01/30/21   Katsadouros, Vasilios, MD     Critical care time:  45 minutes       CRITICAL CARE Performed by: Leita SAUNDERS Fawna Cranmer   Total critical care time: 45 minutes  Critical care time was exclusive of separately billable procedures and treating other patients.  Critical care was necessary to treat or prevent imminent or  life-threatening deterioration.  Critical care was time spent personally by me on the following activities: development of treatment plan with patient and/or surrogate as well as nursing, discussions with consultants, evaluation of patient's response to treatment, examination of patient, obtaining history from patient or surrogate, ordering and performing treatments and interventions, ordering and review of laboratory studies, ordering and review of radiographic studies, pulse oximetry and re-evaluation of patient's condition.   Leita SAUNDERS Tiyona Desouza, PA-C [facility] See Amion for pager If no response to pager , please call [phone] until 7pm After 7:00 pm call Elink  [phone]

## 2024-02-21 NOTE — Significant Event (Signed)
 Rapid Response Event Note   Reason for Call :  Bradycardia and hypotension  Initial Focused Assessment:  Patient is sitting on side of bed.  He is very weak and swaying.  Encouraged him to lie back in bed until he feels stronger.   Lung sounds clear Heart rate slow  BP 60/38  HR 30s  RR 16  O2 sat 96% on RA  Dr Raenelle at bedside to assess patient.   Interventions:  1 amp atropine  IV 1gm calcium  gluconate IV 1L NS bolus  BP 79/35  83/50  HR 40s  BP dropped to 59/38 after fluids stopped 500cc NS bolus  BP 96/51  HR 43  Plan of Care:     Event Summary:   MD Notified: Christobal Ramesh/Kuber Ghimire Call Time: 1734 Arrival Time: 1736 End Time: 1940  Elvin Portland, RN

## 2024-02-21 NOTE — Progress Notes (Addendum)
 Was called by HD RN around 4 pm for bradycardia. I saw the patient, he was asymptomatic and alert, BP was 108/ 75. He had just completed HD w/ 3 L off. On telemetry he was in sinus bradycardia w/ HR mid 40s.  Exam was negative. EKG was ordered. We discussed the case w/ the covering hospitalist and pt was moved back to his room.   Myer Fret  MD  [facility] 02/21/2024, 6:39 PM

## 2024-02-21 NOTE — Consult Note (Signed)
 Renal Service Consult Note [facility]  Ryan Wells 02/21/2024 Ryan Ryan Fret, MD Requesting Physician: Dr. Christobal  Reason for Consult: ESRD patient with generalized weakness and heart rate in the 140s. HPI: The patient is a 68 y.o. year-old w/ PMH as below who presented yesterday 02/20/2024 to ED in the afternoon had not been feeling well for several days with generalized weakness.  Then he started to have some shaking and feeling dizzy and weaker.  These are common symptoms after dialysis but these were happening before dialysis.  He has had prior episodes of syncope in the past.  In the ED blood pressure 120/90, heart rate 143, respiratory rate 14, temp 98, O2 sat 100% on room air.  Potassium 5.9, BUN 55, creatinine 7.9, Phos 5.8, magnesium  2.5.  WBC 6K, hemoglobin 10.0.  Chest x-ray showed no active disease.  Patient was seen by cardiology due to tachycardia.  Patient was given adenosine , it was unclear whether he had atrial flutter or not.  New echocardiogram was ordered and also IV diltiazem  and IV heparin , and also metoprolol  25 mg p.o. 3 times daily.  Patient was admitted by the medical service.  We are asked to see for dialysis.   Pt seen in dialysis unit.  The patient had just started dialysis.  I used an interpreter with him he has no complaints right now he is lying flat and denies any shortness of breath or chest pain.  No recent dialysis issues.   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  Did not get outpatient stress testing following hospitalization.  No chest pain since   Chronic kidney disease    on dialysis Tues, Thurs and Sat   Diabetes mellitus without  complication (HCC)    Diabetes type 2, uncontrolled 11/25/2011   no meds, diet controlled per patient   Diabetic gastroparesis (HCC) 01/22/2017   Hypertension    no meds   Microalbuminuria 01/19/2017   Myocardial infarction [facility]) 2015   Past Surgical History  Past Surgical History:  Procedure Laterality Date   A/V FISTULAGRAM Left 06/04/2019   Procedure: A/V FISTULAGRAM;  Surgeon: Eliza Lonni RAMAN, MD;  Location: [facility] INVASIVE CV LAB;  Service: Cardiovascular;  Laterality: Left;   A/V FISTULAGRAM Left 10/15/2019   Procedure: A/V FISTULAGRAM;  Surgeon: Eliza Lonni RAMAN, MD;  Location: [facility] INVASIVE CV LAB;  Service: Cardiovascular;  Laterality: Left;   A/V FISTULAGRAM N/A 01/24/2021   Procedure: A/V FISTULAGRAM - Left Upper;  Surgeon: Magda Debby SAILOR, MD;  Location: [facility] INVASIVE CV LAB;  Service: Cardiovascular;  Laterality: N/A;   AV FISTULA PLACEMENT Left 07/02/2018   Procedure: BRACHIOCEPHALIC ARTERIOVENOUS (AV) FISTULA CREATION LEFT ARM;  Surgeon: Serene Gaile ORN, MD;  Location: [facility] OR;  Service: Vascular;  Laterality: Left;   BASCILIC VEIN TRANSPOSITION Left 07/26/2019   Procedure: Bascilic Vein Transposition;  Surgeon: Eliza Lonni RAMAN, MD;  Location: [facility] OR;  Service: Vascular;  Laterality: Left;   COLONOSCOPY     EMBOLIZATION (CATH LAB) Left 06/04/2019   Procedure: EMBOLIZATION;  Surgeon: Eliza Lonni RAMAN, MD;  Location: [facility] INVASIVE CV LAB;  Service: Cardiovascular;  Laterality: Left;  LT ARM FISTULA/COMPETING  BRANCH   EYE SURGERY Bilateral    cataracts x2   EYE SURGERY     INSERTION OF DIALYSIS CATHETER Right 06/08/2019   Procedure: INSERTION OF PALINDROME DIALYSIS CATHETER IN RIGHT INTERNAL JUGULAR;  Surgeon: Eliza Lonni RAMAN, MD;  Location: [facility] OR;  Service: Vascular;  Laterality: Right;   LIGATION OF ARTERIOVENOUS  FISTULA Left 07/26/2019   Procedure: Ligation Of BrachioCephalic  Fistula;  Surgeon: Eliza Lonni RAMAN, MD;  Location: [facility] OR;  Service:  Vascular;  Laterality: Left;   NECK SURGERY     PERIPHERAL VASCULAR BALLOON ANGIOPLASTY Left 06/04/2019   Procedure: PERIPHERAL VASCULAR BALLOON ANGIOPLASTY;  Surgeon: Eliza Lonni RAMAN, MD;  Location: [facility] INVASIVE CV LAB;  Service: Cardiovascular;  Laterality: Left;  ARM FISTULA   PERIPHERAL VASCULAR BALLOON ANGIOPLASTY Left 10/15/2019   Procedure: PERIPHERAL VASCULAR BALLOON ANGIOPLASTY;  Surgeon: Eliza Lonni RAMAN, MD;  Location: [facility] INVASIVE CV LAB;  Service: Cardiovascular;  Laterality: Left;  arm fistula   TEE WITHOUT CARDIOVERSION N/A 01/19/2021   Procedure: TRANSESOPHAGEAL ECHOCARDIOGRAM (TEE);  Surgeon: Jeffrie Oneil BROCKS, MD;  Location: [facility] ENDOSCOPY;  Service: Cardiovascular;  Laterality: N/A;   Family History  Family History  Problem Relation Age of Onset   Heart disease Mother        cause of death--CHF?   Diabetes Father    Kidney disease Father        Dialysis for kidney failure   Diabetes Sister    Diabetes Brother    Colon cancer Neg Hx    Stomach cancer Neg Hx    Esophageal cancer Neg Hx    Social History  reports that he has quit smoking. He has never used smokeless tobacco. He reports that he does not currently use alcohol. He reports that he does not use drugs. Allergies No Known Allergies Home medications Prior to Admission medications   Medication Sig Start Date End Date Taking? Authorizing Provider  acetaminophen  (TYLENOL ) 325 MG tablet Take 2 tablets (650 mg total) by mouth every 6 (six) hours as needed for mild pain (or Fever >/= 101). 04/07/21  Yes Ghimire, Kuber, MD  atorvastatin  (LIPITOR) 20 MG tablet TAKE 1 TABLET BY MOUTH DAILY WITH THE EVENING MEAL Patient taking differently: Take 20 mg by mouth at bedtime. 05/23/23  Yes Adella Norris, MD  levothyroxine  (SYNTHROID ) 88 MCG tablet TAKE 1 TABLET(88 MCG) BY MOUTH DAILY Patient taking differently: Take 88 mcg by mouth daily before breakfast. 05/23/23  Yes Adella Norris, MD  sevelamer  carbonate  (RENVELA ) 800 MG tablet Take 1,600 mg by mouth 3 (three) times daily. 01/09/24  Yes [provider]  pentafluoroprop-tetrafluoroeth JUANA) AERO Apply 1 application topically as needed (topical anesthesia for hemodialysis). Patient not taking: Reported on 02/20/2024 01/30/21   Finis Stallion, MD     Vitals:   02/21/24 0230 02/21/24 0300 02/21/24 0541 02/21/24 0844  BP: (!) 105/51 (!) 93/50 118/65 120/62  Pulse:   71 74  Resp:   17 16  Temp: 98 F (36.7 C)  97.7 F (36.5 C) 98.1 F (36.7 C)  TempSrc: Oral   Oral  SpO2: 97% 100% 97% 97%  Weight:   68.4 kg   Height:       Exam Gen alert, no distress No rash, cyanosis or gangrene Sclera anicteric, throat clear  No jvd or bruits Chest clear bilat to bases, no rales/ wheezing RRR no MRG Abd soft ntnd no mass or ascites +bs GU deferred MS no joint effusions or deformity Ext no LE or  UE edema, no other edema Neuro is alert, Ox 3 , nf    AVF + bruit  Home bp meds: None   CXR 7/11 - no active disease    OP HD: [facility] 3h  B400  66.2kg  2K bath  AVF  Hep 2200 Last OP HD 7/09, post wt 67.2kg    Assessment/ Plan: Tachycardia: Possible atrial flutter, cardiology is consulting.  Patient was started on IV diltiazem  and p.o. metoprolol  in the ED yesterday.  IV diltiazem  was stopped overnight due to drop of heart rate into the 60s.  Per cardiology and pmd. ESRD: on HD MWF.  Patient missed dialysis yesterday and has a slightly high potassium today of 5.9.  HD in progress today. BP: Not on BP meds at home, blood pressures here have been normal since admission.  Volume: Patient euvolemic on exam, close to dry weight Anemia of esrd: Hgb 9-11, follow. Hyperkalemia: K+ 5.9, low potassium bath for 90 minutes of dialysis today     Ryan Fret  MD [facility] 02/21/2024, 11:20 AM  Recent Labs  Lab 02/20/24 1340 02/21/24 0818  HGB 10.0* 9.9*  ALBUMIN   --  3.7  CALCIUM  8.9 8.8*  PHOS 5.8*  --   CREATININE 7.93*  9.33*  K 5.9* 6.9*   Inpatient medications:  atorvastatin   20 mg Oral QHS   Chlorhexidine  Gluconate Cloth  6 each Topical Q0600   insulin  aspart  0-15 Units Subcutaneous TID WC   [START ON 02/22/2024] levothyroxine   88 mcg Oral QAC breakfast   metoprolol  tartrate  25 mg Oral TID   sevelamer  carbonate  1,600 mg Oral TID WC   sodium chloride  flush  3 mL Intravenous Q12H   sodium chloride  flush  3-10 mL Intravenous Q12H    heparin  1,100 Units/hr (02/20/24 1845)   acetaminophen  **OR** acetaminophen , morphine  injection, sodium chloride  flush

## 2024-02-22 ENCOUNTER — Inpatient Hospital Stay (HOSPITAL_COMMUNITY)

## 2024-02-22 ENCOUNTER — Other Ambulatory Visit (HOSPITAL_COMMUNITY)

## 2024-02-22 DIAGNOSIS — N186 End stage renal disease: Secondary | ICD-10-CM | POA: Diagnosis present

## 2024-02-22 DIAGNOSIS — I2510 Atherosclerotic heart disease of native coronary artery without angina pectoris: Secondary | ICD-10-CM | POA: Diagnosis present

## 2024-02-22 DIAGNOSIS — E1122 Type 2 diabetes mellitus with diabetic chronic kidney disease: Secondary | ICD-10-CM | POA: Diagnosis present

## 2024-02-22 DIAGNOSIS — Z8249 Family history of ischemic heart disease and other diseases of the circulatory system: Secondary | ICD-10-CM | POA: Diagnosis not present

## 2024-02-22 DIAGNOSIS — E1143 Type 2 diabetes mellitus with diabetic autonomic (poly)neuropathy: Secondary | ICD-10-CM | POA: Diagnosis present

## 2024-02-22 DIAGNOSIS — E785 Hyperlipidemia, unspecified: Secondary | ICD-10-CM | POA: Diagnosis present

## 2024-02-22 DIAGNOSIS — I484 Atypical atrial flutter: Secondary | ICD-10-CM | POA: Diagnosis present

## 2024-02-22 DIAGNOSIS — D631 Anemia in chronic kidney disease: Secondary | ICD-10-CM | POA: Diagnosis present

## 2024-02-22 DIAGNOSIS — Z7901 Long term (current) use of anticoagulants: Secondary | ICD-10-CM | POA: Diagnosis not present

## 2024-02-22 DIAGNOSIS — E861 Hypovolemia: Secondary | ICD-10-CM | POA: Diagnosis present

## 2024-02-22 DIAGNOSIS — Z79899 Other long term (current) drug therapy: Secondary | ICD-10-CM | POA: Diagnosis not present

## 2024-02-22 DIAGNOSIS — Z992 Dependence on renal dialysis: Secondary | ICD-10-CM | POA: Diagnosis not present

## 2024-02-22 DIAGNOSIS — I952 Hypotension due to drugs: Secondary | ICD-10-CM

## 2024-02-22 DIAGNOSIS — I953 Hypotension of hemodialysis: Secondary | ICD-10-CM | POA: Diagnosis present

## 2024-02-22 DIAGNOSIS — Z79890 Hormone replacement therapy (postmenopausal): Secondary | ICD-10-CM | POA: Diagnosis not present

## 2024-02-22 DIAGNOSIS — I4892 Unspecified atrial flutter: Secondary | ICD-10-CM | POA: Diagnosis not present

## 2024-02-22 DIAGNOSIS — D6949 Other primary thrombocytopenia: Secondary | ICD-10-CM | POA: Diagnosis present

## 2024-02-22 DIAGNOSIS — I471 Supraventricular tachycardia, unspecified: Secondary | ICD-10-CM | POA: Diagnosis present

## 2024-02-22 DIAGNOSIS — E1169 Type 2 diabetes mellitus with other specified complication: Secondary | ICD-10-CM | POA: Diagnosis present

## 2024-02-22 DIAGNOSIS — R000 Tachycardia, unspecified: Secondary | ICD-10-CM | POA: Diagnosis not present

## 2024-02-22 DIAGNOSIS — I132 Hypertensive heart and chronic kidney disease with heart failure and with stage 5 chronic kidney disease, or end stage renal disease: Secondary | ICD-10-CM | POA: Diagnosis present

## 2024-02-22 DIAGNOSIS — R931 Abnormal findings on diagnostic imaging of heart and coronary circulation: Secondary | ICD-10-CM | POA: Diagnosis not present

## 2024-02-22 DIAGNOSIS — E875 Hyperkalemia: Secondary | ICD-10-CM | POA: Diagnosis present

## 2024-02-22 DIAGNOSIS — E039 Hypothyroidism, unspecified: Secondary | ICD-10-CM | POA: Diagnosis present

## 2024-02-22 DIAGNOSIS — E872 Acidosis, unspecified: Secondary | ICD-10-CM | POA: Diagnosis present

## 2024-02-22 DIAGNOSIS — I482 Chronic atrial fibrillation, unspecified: Secondary | ICD-10-CM | POA: Diagnosis present

## 2024-02-22 DIAGNOSIS — I5021 Acute systolic (congestive) heart failure: Secondary | ICD-10-CM | POA: Diagnosis present

## 2024-02-22 DIAGNOSIS — R7881 Bacteremia: Secondary | ICD-10-CM | POA: Diagnosis present

## 2024-02-22 LAB — GLUCOSE, CAPILLARY
Glucose-Capillary: 80 mg/dL (ref 70–99)
Glucose-Capillary: 81 mg/dL (ref 70–99)
Glucose-Capillary: 108 mg/dL — ABNORMAL HIGH (ref 70–99)
Glucose-Capillary: 117 mg/dL — ABNORMAL HIGH (ref 70–99)
Glucose-Capillary: 155 mg/dL — ABNORMAL HIGH (ref 70–99)

## 2024-02-22 LAB — CBC
HCT: 31.4 % — ABNORMAL LOW (ref 39.0–52.0)
Hemoglobin: 10 g/dL — ABNORMAL LOW (ref 13.0–17.0)
MCH: 30.9 pg (ref 26.0–34.0)
MCHC: 31.8 g/dL (ref 30.0–36.0)
MCV: 96.9 fL (ref 80.0–100.0)
Platelets: 107 K/uL — ABNORMAL LOW (ref 150–400)
RBC: 3.24 MIL/uL — ABNORMAL LOW (ref 4.22–5.81)
RDW: 14.4 % (ref 11.5–15.5)
WBC: 11.1 K/uL — ABNORMAL HIGH (ref 4.0–10.5)
nRBC: 0 % (ref 0.0–0.2)

## 2024-02-22 LAB — MRSA NEXT GEN BY PCR, NASAL: MRSA by PCR Next Gen: NOT DETECTED

## 2024-02-22 LAB — BASIC METABOLIC PANEL WITH GFR
Anion gap: 14 (ref 5–15)
BUN: 35 mg/dL — ABNORMAL HIGH (ref 8–23)
CO2: 24 mmol/L (ref 22–32)
Calcium: 9 mg/dL (ref 8.9–10.3)
Chloride: 96 mmol/L — ABNORMAL LOW (ref 98–111)
Creatinine, Ser: 5.67 mg/dL — ABNORMAL HIGH (ref 0.61–1.24)
GFR, Estimated: 10 mL/min — ABNORMAL LOW (ref >60)
Glucose, Bld: 94 mg/dL (ref 70–99)
Potassium: 5.6 mmol/L — ABNORMAL HIGH (ref 3.5–5.1)
Sodium: 134 mmol/L — ABNORMAL LOW (ref 135–145)

## 2024-02-22 LAB — LACTIC ACID, PLASMA
Lactic Acid, Venous: 3.4 mmol/L (ref 0.5–1.9)
Lactic Acid, Venous: 1.9 mmol/L (ref 0.5–1.9)

## 2024-02-22 LAB — T4, FREE: Free T4: 0.95 ng/dL (ref 0.61–1.12)

## 2024-02-22 LAB — ECHOCARDIOGRAM LIMITED
Est EF: 50
Height: 62 in
S' Lateral: 3.4 cm
Weight: 2299.84 [oz_av]

## 2024-02-22 LAB — HEPARIN LEVEL (UNFRACTIONATED): Heparin Unfractionated: 0.47 [IU]/mL (ref 0.30–0.70)

## 2024-02-22 MED ORDER — NOREPINEPHRINE 4 MG/250ML-% IV SOLN
0.0000 ug/min | INTRAVENOUS | Status: DC
  Administered 2024-02-22: 2 ug/min via INTRAVENOUS
  Filled 2024-02-22: qty 250

## 2024-02-22 MED ORDER — SODIUM ZIRCONIUM CYCLOSILICATE 10 G PO PACK
10.0000 g | PACK | Freq: Once | ORAL | Status: AC
  Administered 2024-02-22: 10 g via ORAL
  Filled 2024-02-22: qty 1

## 2024-02-22 NOTE — Progress Notes (Addendum)
 [facility] Progress Note   Interval History : Admit 7/11 with Aflutter/RVR. Became bradycardic and hypotensive after HD Saturday. Had 3L removed with HD. Rapid response called - received 2L NS bolus, calcium  gluconate, atropine . Cardizem  gtt stopped. PCCM consulted and patient transferred to ICU for pressor support.   Seen in ICU. Pressors off. BP 127/56 HR 52. Sitting up in recliner. Video interpreter used. Feels well this am. Denies dizziness, chest pain, dyspnea, nausea. K 5.6. Wife says he had a banana this morning. May transfer out of ICU today.   Objective Vitals:   02/22/24 0700 02/22/24 0730 02/22/24 0746 02/22/24 0800  BP: (!) 124/51 (!) 134/50  (!) 126/52  Pulse: (!) 49 (!) 50  (!) 50  Resp: 16 (!) 0  13  Temp:   98.4 F (36.9 C)   TempSrc:   Oral   SpO2: 93% 96%  99%  Weight:      Height:        Additional Objective Labs: Basic Metabolic Panel: Recent Labs  Lab 02/20/24 1340 02/21/24 0818 02/21/24 2032 02/22/24 0233  NA 135 135 135 134*  K 5.9* 6.9* 4.8 5.6*  CL 95* 95* 98 96*  CO2 24 24 23 24   GLUCOSE 116* 116* 114* 94  BUN 55* 69* 32* 35*  CREATININE 7.93* 9.33* 5.24* 5.67*  CALCIUM  8.9 8.8* 8.3* 9.0  PHOS 5.8*  --   --   --    CBC: Recent Labs  Lab 02/20/24 1340 02/21/24 0818 02/22/24 0233  WBC 6.8 5.5 11.1*  NEUTROABS 4.4  --   --   HGB 10.0* 9.9* 10.0*  HCT 30.5* 30.4* 31.4*  MCV 95.6 95.0 96.9  PLT 111* 114* 107*   Blood Culture    Component Value Date/Time   SDES ABSCESS 03/19/2021 1709   SPECREQUEST LEFT PSOAS 03/19/2021 1709   CULT  03/19/2021 1709    FEW STAPHYLOCOCCUS AUREUS NO ANAEROBES ISOLATED Performed at [facility], [address] [place], [place] [address]    REPTSTATUS 03/24/2021 FINAL 03/19/2021 1709    Physical Exam General: Alert, well appearing, nad  Heart: RRR Lungs: Clear, normal wob  Abdomen: nontender  Extremities: no LE edema  Dialysis Access: AVF +bruit   Medications:  sodium  chloride Stopped (02/22/24 0107)   heparin  1,100 Units/hr (02/22/24 0800)   norepinephrine  (LEVOPHED ) Adult infusion Stopped (02/22/24 0244)   sodium chloride  Stopped (02/21/24 2009)    atorvastatin   20 mg Oral QHS   Chlorhexidine  Gluconate Cloth  6 each Topical Q0600   insulin  aspart  0-15 Units Subcutaneous TID WC   levothyroxine   88 mcg Oral QAC breakfast   sevelamer  carbonate  1,600 mg Oral TID WC   sodium chloride  flush  3 mL Intravenous Q12H   sodium chloride  flush  3-10 mL Intravenous Q12H    Dialysis Orders:  [facility]  3h  B400  66.2kg  2K bath  AVF  Hep 2200 Last OP HD 7/09, post wt 67.2kg -Mircera 50 mcg q 2 wks (last 6/20)  Assessment/Plan: New onset Aflutter. RVR on admit. Amiodarone gtt started but developed bradycardia/hypotension post HD. Now sinus brady. Cardiology following. Plan for repeat Echo.  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 Cardizem  and metoprolol  stopped. As above. Per cardiology.  ESRD. HD MWF. Had HD yesterday for HyperK. Next HD either tonight or early am since will need to be done separate.  Hyperkalemia. Lokelma  ordered. Will correct with HD. Follow low potassium diet.  Volume. Appears euvolemic.  Now at EDW. Minimal UF with next HD.  Anemia. Hgb 10. On ESA as outpatient.  2HPTH. Ca/Phos acceptable. Continue home meds.  DM. Insulin  per primary  Thrombocytopenia. Chronic since 04/2023 per OP records.    Maisie Ronnald Acosta PA-C [facility] 02/22/2024,8:46 AM

## 2024-02-22 NOTE — Plan of Care (Signed)
  Problem: Skin Integrity: Goal: Risk for impaired skin integrity will decrease Outcome: Progressing   Problem: Tissue Perfusion: Goal: Adequacy of tissue perfusion will improve Outcome: Progressing   Problem: Activity: Goal: Risk for activity intolerance will decrease Outcome: Progressing   Problem: Coping: Goal: Level of anxiety will decrease Outcome: Progressing

## 2024-02-22 NOTE — Progress Notes (Signed)
 ANTICOAGULATION CONSULT NOTE  Pharmacy Consult for Heparin  Indication: atrial fibrillation  No Known Allergies  Patient Measurements: Height: 5' 2 (157.5 cm) Weight: 65.2 kg (143 lb 11.8 oz) IBW/kg (Calculated) : 54.6 Heparin  Dosing Weight: 68.3 kg  Vital Signs: Temp: 98.4 F (36.9 C) (07/13 0746) Temp Source: Oral (07/13 0746) BP: 133/55 (07/13 1000) Pulse Rate: 50 (07/13 1000)  Labs: Recent Labs    02/20/24 1340 02/20/24 1707 02/21/24 0112 02/21/24 0818 02/21/24 2032 02/22/24 0233  HGB 10.0*  --   --  9.9*  --  10.0*  HCT 30.5*  --   --  30.4*  --  31.4*  PLT 111*  --   --  114*  --  107*  HEPARINUNFRC  --   --  0.47 0.55  --  0.47  CREATININE 7.93*  --   --  9.33* 5.24* 5.67*  TROPONINIHS 25* 25*  --   --   --   --     Estimated Creatinine Clearance: 9.5 mL/min (A) (by C-G formula based on SCr of 5.67 mg/dL (H)).   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  Did not get outpatient stress testing following hospitalization.  No chest pain since   Chronic kidney disease    on dialysis Tues, Thurs and Sat   Diabetes mellitus without complication (HCC)    Diabetes type 2, uncontrolled 11/25/2011   no meds, diet controlled per patient   Diabetic gastroparesis (HCC) 01/22/2017   Hypertension    no meds   Microalbuminuria 01/19/2017   Myocardial infarction [facility]) 2015    Medications:  Medications Prior to Admission  Medication Sig Dispense Refill Last Dose/Taking   acetaminophen  (TYLENOL ) 325 MG tablet Take 2 tablets (650 mg total) by mouth every 6 (six) hours as needed for mild pain (or Fever >/= 101).   Unknown   atorvastatin  (LIPITOR) 20 MG tablet TAKE 1 TABLET BY MOUTH DAILY WITH THE EVENING MEAL (Patient taking differently: Take 20 mg by mouth at bedtime.) 30 tablet 11  02/19/2024   levothyroxine  (SYNTHROID ) 88 MCG tablet TAKE 1 TABLET(88 MCG) BY MOUTH DAILY (Patient taking differently: Take 88 mcg by mouth daily before breakfast.) 30 tablet 11 02/19/2024   sevelamer  carbonate (RENVELA ) 800 MG tablet Take 1,600 mg by mouth 3 (three) times daily.   02/18/2024   pentafluoroprop-tetrafluoroeth (GEBAUERS) AERO Apply 1 application topically as needed (topical anesthesia for hemodialysis). (Patient not taking: Reported on 02/20/2024)  0 Not Taking   Scheduled:   atorvastatin   20 mg Oral QHS   Chlorhexidine  Gluconate Cloth  6 each Topical Q0600   insulin  aspart  0-15 Units Subcutaneous TID WC   levothyroxine   88 mcg Oral QAC breakfast   sevelamer  carbonate  1,600 mg Oral TID WC   sodium chloride  flush  3 mL Intravenous Q12H   sodium chloride  flush  3-10 mL Intravenous Q12H   Infusions:   sodium chloride  Stopped (02/22/24 0107)   heparin  1,100 Units/hr (02/22/24 1000)   norepinephrine  (LEVOPHED ) Adult infusion Stopped (02/22/24 0244)   sodium chloride  Stopped (02/21/24 2009)   PRN: acetaminophen  **OR** acetaminophen , morphine  injection, ondansetron  (ZOFRAN ) IV, sodium chloride , sodium chloride  flush  Assessment: 68 YO M with a history of ESRD, hypertension, T2DM, MSSA bacteremia. Patient is presenting with weakness. Heparin  per pharmacy consult placed  for atrial fibrillation. -Patient not on anticoagulation PTA. -CBC stable -Heparin  therapeutic at 0.47 on current rate.   Goal of Therapy:  Heparin  level 0.3-0.7 units/ml Monitor platelets by anticoagulation protocol: Yes   Plan:  Continue heparin  1100 units/hr Monitor CBC, heparin  level with AM labs  Harlene Boga, PharmD, BCPS, BCCCP Please refer to [facility] for [facility] numbers 02/22/2024 10:51 AM

## 2024-02-22 NOTE — Progress Notes (Signed)
 PROGRESS NOTE Ryan Wells  FMW:[id number] DOB: January 11, 1956 DOA: 02/20/2024 PCP: Adella Norris, MD  Brief Narrative/Hospital Course: 68 year old male with ESRD on HD TTS HTN diabetes on diet control, history of MSSA bacteremia presented with tachycardia and weakness from dialysis and found to have narrow complex tachycardia cardiology was consulted placed on adenosine  x 2 and then Cardizem  drip and admitted for further management  Subjective: Patient seen and examined Wife at the bedside Overnight transferred to the ICU but nursing reports patient did not need vasopressors ( barely used for seconds) Is resting comfortably in the bedside chair alert awake oriented x 3 no complaint Heart rate in 50s BP remains stable decimating 120s, 90% room air heart rate in high 40s to-50s Labs with hyperkalemia 5.6 hemoglobin 10 stable  Assessment and plan:  Narrow complex tachycardia likely atypical a flutter Bradycardia/hypotension due to Cardizem  and hypovolemia Lactic acidosis due to hypotension: Cardiology following.  Admitted on Cardizem  drip with plan to transition to p.o. metoprolol  and on IV heparin . Following dialysis with removal of extra fluids below the dry weight became hypovolemic and also Cardizem  was running> subsequently it was discontinued  and s/p 2 L IVF bolus, calcium  chloride 2 gm > BP was soft, transferred to ICU overnight did not need much vasopressors (for few mins-seconds only per RN) Heart rate in 50s and BP stable this morning Initial echo showed EF 35-40% while heart rate in 140s> heart rate stable, repeat echo ordered  Acute systolic dysfunction: CAD history: EF is low but in the setting of tachycardia, follow-up with repeat echo.  Noted previous plan for PET stress test as outpatient which patient did not follow-up  ESRD on HD TTS Hyperkalemia Metabolic bone disease: Nephrology input appreciated got HD 7/12, potassium on higher side, continue low potassium  diet, cont sevelamer .  Nephrology following Recent Labs    09/30/23 0921 02/20/24 1340 02/21/24 0818 02/21/24 2032 02/22/24 0233  BUN 84* 55* 69* 32* 35*  CREATININE 9.67* 7.93* 9.33* 5.24* 5.67*  CO2 19* 24 24 23 24   K CANCELED 5.9* 6.9* 4.8 5.6*    Hypothyroidism: TSH elevated 9.7 but free T4 normal continue home Synthroid  will need recheck in 3 to 4 weeks to adjust  Anemia of chronic kidney disease: stable baseline around 8 to 9 g.  Monitor  Thrombocytopenia chronic: Previously platelet back in February was in the 80s currently in 111, monitor  Diabetes mellitus with hyperlipidemia: diet controlled DM, cont ssi cont statin.  Plan of care discussed with the patient and patient's wife at the bedside Used video interpreter-all questions were answered If remains stable this afternoon, transfer out of ICU today  DVT prophylaxis: Heparin  drip Code Status:   Code Status: Full Code Family Communication: plan of care discussed with patient at bedside. Patient status is: Remains hospitalized because of severity of illness Level of care: Progressive   Dispo: The patient is from: home            Anticipated disposition: TBD Objective: Vitals last 24 hrs: Vitals:   02/22/24 0900 02/22/24 0930 02/22/24 1000 02/22/24 1112  BP: (!) 127/46 (!) 139/53 (!) 133/55   Pulse: (!) 51 (!) 49 (!) 50   Resp: 16 16 16    Temp:    98.6 F (37 C)  TempSrc:    Oral  SpO2: 91% 94% 93%   Weight:      Height:        Physical Examination: General exam: alert awake, oriented  HEENT:Oral mucosa moist,  Ear/Nose WNL grossly Respiratory system: Bilaterally clear BS,no use of accessory muscle Cardiovascular system: S1 & S2 +, No JVD. Gastrointestinal system: Abdomen soft,NT,ND, BS+ Nervous System: Alert, awake, moving all extremities Extremities: LE edema neg,distal peripheral pulses palpable and warm.  Skin: No rashes,no icterus. MSK: Normal muscle bulk,tone, power   Data Reviewed: I have  personally reviewed following labs and imaging studies ( see epic result tab) CBC: Recent Labs  Lab 02/20/24 1340 02/21/24 0818 02/22/24 0233  WBC 6.8 5.5 11.1*  NEUTROABS 4.4  --   --   HGB 10.0* 9.9* 10.0*  HCT 30.5* 30.4* 31.4*  MCV 95.6 95.0 96.9  PLT 111* 114* 107*   CMP: Recent Labs  Lab 02/20/24 1340 02/21/24 0818 02/21/24 2032 02/22/24 0233  NA 135 135 135 134*  K 5.9* 6.9* 4.8 5.6*  CL 95* 95* 98 96*  CO2 24 24 23 24   GLUCOSE 116* 116* 114* 94  BUN 55* 69* 32* 35*  CREATININE 7.93* 9.33* 5.24* 5.67*  CALCIUM  8.9 8.8* 8.3* 9.0  MG 2.5*  --   --   --   PHOS 5.8*  --   --   --    GFR: Estimated Creatinine Clearance: 9.5 mL/min (A) (by C-G formula based on SCr of 5.67 mg/dL (H)). Recent Labs  Lab 02/21/24 0818  AST 11*  ALT 14  ALKPHOS 103  BILITOT 1.2  PROT 7.2  ALBUMIN  3.7   No results for input(s): LIPASE, AMYLASE in the last 168 hours. No results for input(s): AMMONIA in the last 168 hours. Coagulation Profile: No results for input(s): INR, PROTIME in the last 168 hours. Unresulted Labs (From admission, onward)     Start     Ordered   02/22/24 0500  Basic metabolic panel with GFR  Daily,   R      02/21/24 0837   02/22/24 0500  CBC  Daily,   R      02/21/24 0837   02/21/24 1114  Hepatitis B surface antibody,quantitative  (New Admission Hemo Labs (Hepatitis B))  Once,   R        02/21/24 1119   02/21/24 0500  Heparin  level (unfractionated)  Daily,   R      02/20/24 1705           Antimicrobials/Microbiology: Anti-infectives (From admission, onward)    None         Component Value Date/Time   SDES ABSCESS 03/19/2021 1709   SPECREQUEST LEFT PSOAS 03/19/2021 1709   CULT  03/19/2021 1709    FEW STAPHYLOCOCCUS AUREUS NO ANAEROBES ISOLATED Performed at [facility], [address] [place], [place] [address]    REPTSTATUS 03/24/2021 FINAL 03/19/2021 1709     Medications reviewed:  Scheduled Meds:  atorvastatin   20 mg  Oral QHS   Chlorhexidine  Gluconate Cloth  6 each Topical Q0600   insulin  aspart  0-15 Units Subcutaneous TID WC   levothyroxine   88 mcg Oral QAC breakfast   sevelamer  carbonate  1,600 mg Oral TID WC   sodium chloride  flush  3 mL Intravenous Q12H   sodium chloride  flush  3-10 mL Intravenous Q12H   Continuous Infusions:  sodium chloride  Stopped (02/22/24 0107)   heparin  1,100 Units/hr (02/22/24 1000)   norepinephrine  (LEVOPHED ) Adult infusion Stopped (02/22/24 0244)   sodium chloride  Stopped (02/21/24 2009)    Ryan LAMY, MD [facility] 02/22/2024, 1:55 PM

## 2024-02-22 NOTE — Progress Notes (Signed)
 Progress Note  Patient Name: Ryan Wells Date of Encounter: 02/22/2024 [facility] Cardiologist: Lonni LITTIE Nanas, MD   Interval Summary   Had bradycardial and hypotension at dialysis yesterday, given glucagon and atropine , and AVN blocking agents held.  Now back in sinus rhythm with sinus bradycardia, RN feels at times junctional rhythm, consistent with EKG.   Vital Signs Vitals:   02/22/24 0900 02/22/24 0930 02/22/24 1000 02/22/24 1112  BP: (!) 127/46 (!) 139/53 (!) 133/55   Pulse: (!) 51 (!) 49 (!) 50   Resp: 16 16 16    Temp:    98.6 F (37 C)  TempSrc:    Oral  SpO2: 91% 94% 93%   Weight:      Height:        Intake/Output Summary (Last 24 hours) at 02/22/2024 1142 Last data filed at 02/22/2024 1000 Gross per 24 hour  Intake 2620.98 ml  Output --  Net 2620.98 ml      02/21/2024    5:43 PM 02/21/2024    5:41 AM 02/20/2024    1:14 PM  Last 3 Weights  Weight (lbs) 143 lb 11.8 oz 150 lb 12.7 oz 150 lb 9.2 oz  Weight (kg) 65.2 kg 68.4 kg 68.3 kg      Telemetry/ECG  Sinus bradycardia rate 50s. Occasional junctional rhythm - Personally Reviewed  Physical Exam  GEN: No acute distress.   Neck: No JVD Cardiac: RRR, 1/6 systolic and diastolic murmur Respiratory: Clear to auscultation bilaterally. GI: Soft, nontender, non-distended  MS: No edema  Assessment & Plan  #atypical atrial flutter, now SR - had bradycardia and hypotension after dialysis yesterday, treated, placed in ICU. Now in sinus with bradycardia rates 50s and stable. - recommend repeat echo given wide pulse pressure and diastolic murmur.   Acute HFrEF - no HF signs.  - EF down, may be due to HR being 140. Will repeat echo to confirm now that rate better. Will consider GDMT after confirmation of EF.   Seems stable for transfer out of ICU today from CV perspective.    For questions or updates, please contact [facility] Please consult [email or web address] for contact info under        Signed, Rosell Khouri A Nuel Dejaynes, MD

## 2024-02-22 NOTE — Progress Notes (Signed)
 65 - This RN called patient's wife through the use of an interpreter and notified her of need for increased level of care. Room assignment given [phone]) and questions answered.   9963 - Patient transferred with all belongings to [phone]

## 2024-02-23 ENCOUNTER — Other Ambulatory Visit (HOSPITAL_COMMUNITY): Payer: Self-pay

## 2024-02-23 ENCOUNTER — Telehealth (HOSPITAL_COMMUNITY): Payer: Self-pay | Admitting: Pharmacy Technician

## 2024-02-23 DIAGNOSIS — I4892 Unspecified atrial flutter: Secondary | ICD-10-CM

## 2024-02-23 DIAGNOSIS — R000 Tachycardia, unspecified: Secondary | ICD-10-CM | POA: Diagnosis not present

## 2024-02-23 DIAGNOSIS — R931 Abnormal findings on diagnostic imaging of heart and coronary circulation: Secondary | ICD-10-CM

## 2024-02-23 LAB — GLUCOSE, CAPILLARY
Glucose-Capillary: 86 mg/dL (ref 70–99)
Glucose-Capillary: 88 mg/dL (ref 70–99)
Glucose-Capillary: 113 mg/dL — ABNORMAL HIGH (ref 70–99)

## 2024-02-23 LAB — CBC
HCT: 30.4 % — ABNORMAL LOW (ref 39.0–52.0)
Hemoglobin: 9.9 g/dL — ABNORMAL LOW (ref 13.0–17.0)
MCH: 30.9 pg (ref 26.0–34.0)
MCHC: 32.6 g/dL (ref 30.0–36.0)
MCV: 95 fL (ref 80.0–100.0)
Platelets: 102 K/uL — ABNORMAL LOW (ref 150–400)
RBC: 3.2 MIL/uL — ABNORMAL LOW (ref 4.22–5.81)
RDW: 14.6 % (ref 11.5–15.5)
WBC: 7.1 K/uL (ref 4.0–10.5)
nRBC: 0 % (ref 0.0–0.2)

## 2024-02-23 LAB — BASIC METABOLIC PANEL WITH GFR
Anion gap: 16 — ABNORMAL HIGH (ref 5–15)
BUN: 56 mg/dL — ABNORMAL HIGH (ref 8–23)
CO2: 21 mmol/L — ABNORMAL LOW (ref 22–32)
Calcium: 8.7 mg/dL — ABNORMAL LOW (ref 8.9–10.3)
Chloride: 92 mmol/L — ABNORMAL LOW (ref 98–111)
Creatinine, Ser: 7.28 mg/dL — ABNORMAL HIGH (ref 0.61–1.24)
GFR, Estimated: 8 mL/min — ABNORMAL LOW (ref >60)
Glucose, Bld: 95 mg/dL (ref 70–99)
Potassium: 5.8 mmol/L — ABNORMAL HIGH (ref 3.5–5.1)
Sodium: 129 mmol/L — ABNORMAL LOW (ref 135–145)

## 2024-02-23 LAB — HEPARIN LEVEL (UNFRACTIONATED): Heparin Unfractionated: 0.47 [IU]/mL (ref 0.30–0.70)

## 2024-02-23 MED ORDER — APIXABAN 5 MG PO TABS
5.0000 mg | ORAL_TABLET | Freq: Two times a day (BID) | ORAL | 0 refills | Status: DC
  Filled 2024-02-23: qty 60, 30d supply, fill #0

## 2024-02-23 MED ORDER — SODIUM ZIRCONIUM CYCLOSILICATE 10 G PO PACK
10.0000 g | PACK | Freq: Once | ORAL | Status: AC
  Administered 2024-02-23: 10 g via ORAL
  Filled 2024-02-23: qty 1

## 2024-02-23 MED ORDER — APIXABAN 5 MG PO TABS
5.0000 mg | ORAL_TABLET | Freq: Two times a day (BID) | ORAL | Status: DC
  Administered 2024-02-23: 5 mg via ORAL
  Filled 2024-02-23: qty 1

## 2024-02-23 MED ORDER — LOPERAMIDE HCL 2 MG PO CAPS
2.0000 mg | ORAL_CAPSULE | ORAL | Status: DC | PRN
  Administered 2024-02-23: 2 mg via ORAL
  Filled 2024-02-23: qty 1

## 2024-02-23 MED ORDER — CALCITRIOL 0.5 MCG PO CAPS
2.0000 ug | ORAL_CAPSULE | ORAL | Status: DC
  Administered 2024-02-23: 2 ug via ORAL
  Filled 2024-02-23: qty 4

## 2024-02-23 NOTE — Procedures (Signed)
 Seen and examined on dialysis.  Procedure supervised.  Blood pressure 157/61 and HR 51.  Tolerating goal.  Left AVF in use  Katheryn JAYSON Saba, MD 02/23/2024 12:32 PM

## 2024-02-23 NOTE — Discharge Instructions (Addendum)

## 2024-02-23 NOTE — Progress Notes (Addendum)
 Progress Note  Patient Name: Ryan Wells Date of Encounter: 02/23/2024 [facility] Cardiologist: Lonni LITTIE Nanas, MD   Interval Summary   Patient feeling well today other that mild stomach upset which he attributes to all of the medications I'm taking.  Vital Signs Vitals:   02/23/24 0024 02/23/24 0313 02/23/24 0422 02/23/24 0807  BP: 137/66 (!) 132/53  (!) 136/57  Pulse: (!) 53 (!) 44  (!) 48  Resp: 15 16  17   Temp: 98.3 F (36.8 C) 98.5 F (36.9 C)  (!) 97.5 F (36.4 C)  TempSrc: Oral Oral  Oral  SpO2: 98% 98%  97%  Weight:   67.6 kg   Height:        Intake/Output Summary (Last 24 hours) at 02/23/2024 0921 Last data filed at 02/23/2024 0850 Gross per 24 hour  Intake 328.01 ml  Output --  Net 328.01 ml      02/23/2024    4:22 AM 02/21/2024    5:43 PM 02/21/2024    5:41 AM  Last 3 Weights  Weight (lbs) 149 lb 1.6 oz 143 lb 11.8 oz 150 lb 12.7 oz  Weight (kg) 67.631 kg 65.2 kg 68.4 kg      Telemetry/ECG  Sinus bradycardia - Personally Reviewed  Physical Exam  GEN: No acute distress.   Neck: No JVD Cardiac: RRR, 3/6 systolic murmur over apex  Respiratory: Left lower lobe with crackles GI: Soft, nontender, non-distended  MS: No edema  Assessment & Plan  Ryan Wells is a 68 y.o. male with a hx of hx of ESRD, hypertension, T2DM, MSSA bacteremia who was first seen 02/20/2024 for the evaluation of A. Fib at the request of Dr. Charlyn.   Atypical atrial flutter Symptomatic bradycardia Patient noted with elevated HR at dialysis, was sent to the ED. Initial rhythm concerning for atrial flutter, ventricular rate >140bpm. Initially treated with Diltiazem  but stopped 7/12 with bradycardia. Following HD on 7/12, patient with HR as low as 28 with hypotension. He was given 1mg  atropine  as well as calcium  gluconate with positive HR response. Rhythm ultimately documented as sinus bradycardia.  Patient remains in sinus bradycardia. Avoid rate  controlling agents. Currently on heparin , with CHADS2VASC score 3, meets guidelines for ongoing stroke prophylaxis. Will convert to eliquis   Abnormal echo TTE on 7/11 in setting of tachy-arrhythmia noted LVEF 35-40% with global hypokinesis. Given that this was obtained while patient had elevated HR, a repeat limited study was completed 7/13 with slow rates. This repeat study noted LVEF 50% with mild LVH. RA pressure elevated, estimated RV systolic pressure 37.5. Given pulmonary crackles on exam and elevated right heart pressures, patient may need additional fluid removal. Defer to nephrology team.   Hx of chest pain Hx of Syncope Of note, patient seen in clinic by Dr. Nanas on 08/26/23 for evaluation of chest pain/syncope. Both heart monitor and Zio arranged but neither completed. No chest pain this admission.   [facility] will sign off.   Medication Recommendations:  Eliquis  5 mg BID Other recommendations (labs, testing, etc):  None Follow up as an outpatient:  Will schedule   For questions or updates, please contact [facility] Please consult [email or web address] for contact info under       Signed, Artist Pouch, PA-C    Patient seen and examined.  Agree with above documentation.  On exam, patient is alert and oriented, bradycardic, regular, no murmurs, lungs CTAB, trace lower extremity edema.  Telemetry reviewed, remains in sinus  bradycardia.  Continue to hold rate controlling agents.  He remains on IV heparin , will convert to Eliquis .  Lonni LITTIE Nanas, MD

## 2024-02-23 NOTE — Discharge Summary (Signed)
 Physician Discharge Summary  Ryan Wells FMW:[id number] DOB: 04/21/1956 DOA: 02/20/2024  PCP: Adella Norris, MD  Admit date: 02/20/2024 Discharge date: 02/23/2024 Recommendations for Outpatient Follow-up:  Follow up with PCP in 1 weeks-call for appointment Please obtain BMP/CBC in one week  Discharge Dispo: Home Discharge Condition: Stable Code Status:   Code Status: Full Code Diet recommendation:  Diet Order             Diet renal/carb modified with fluid restriction Diet-HS Snack? Nothing; Fluid restriction: 1500 mL Fluid; Room service appropriate? Yes; Fluid consistency: Thin  Diet effective now                    Brief/Interim Summary: 68 year old male with ESRD on HD TTS HTN diabetes on diet control, history of MSSA bacteremia presented with tachycardia and weakness from dialysis and found to have narrow complex tachycardia cardiology was consulted placed on adenosine  x 2 and then Cardizem  drip and admitted for further management.  Patient was placed on Cardizem  drip for a flutter with RVR, seen by cardiology.  Had bradycardia and hypotension, taken off Cardizem  given volume resuscitated, along with atropine  x 1, calcium  gluconate-was transferred to ICU for close monitoring blood did not need peripheral pressors.  Per nursing received for few seconds-minute and stopped. Patient remains sinus bradycardia hemodynamically stable, heparin  being changed to Eliquis  and plan for discharge after dialysis with does well.  Subjective: Patient seen examined this morning eager to go home after dialysis today Complains of stomach upset from all the medication I am getting Some loose stool but not watery Heart rate in 50s, BP stable in sinus bradycardia  Discharge diagnosis:  Narrow complex tachycardia likely atypical a flutter Bradycardia/hypotension due to Cardizem  and hypovolemia Lactic acidosis due to hypotension Symptomatic bradycardia: Cardiology following.   Admitted on Cardizem  drip with plan to transition to p.o. metoprolol  and on IV heparin . Following dialysis with removal of extra fluids below the dry weight became hypovolemic and also Cardizem  was running> subsequently it was discontinued  and s/p 2 L IVF bolus, calcium  chloride 2 gm > BP was soft, transferred to ICU overnight but did not need much vasopressors (for few mins-seconds only per RN).  Remains hemodynamically stable.Avoid rate controlling agent, transitioned heparin  to Eliquis  and it med was arranged from [facility] pharmacy> he will follow up at a fib clinic for further dosing. Initial echo showed EF 35-40% while heart rate in 140s> eart rate stable, repeat echo shows EF up at 50%. Cardiology signed off.   Acute systolic dysfunction: CAD history: EF is low but in the setting of tachycardia, appears to be improved now on repeat echo.  Noted previous plan for PET stress test as outpatient which patient did not follow-up  ESRD on HD  Hyperkalemia Metabolic bone disease: Nephrology input appreciated got HD 7/12, potassium on higher side,advised low k diet. HD today and tolerated well no hypotension. Cont  sevelamer .  Recent Labs    09/30/23 0921 02/20/24 1340 02/21/24 0818 02/21/24 2032 02/22/24 0233 02/23/24 0311  BUN 84* 55* 69* 32* 35* 56*  CREATININE 9.67* 7.93* 9.33* 5.24* 5.67* 7.28*  CO2 19* 24 24 23 24  21*  K CANCELED 5.9* 6.9* 4.8 5.6* 5.8*    Hypothyroidism: TSH elevated 9.7 but free T4 normal continue home Synthroid  , needs recheck in 3 to 4 weeks to adjust  Anemia of chronic kidney disease: Stable  Thrombocytopenia chronic: Previously platelet back in February was in the 80s currently in 100s  Diabetes mellitus with hyperlipidemia: diet controlled DM, cont ssi cont statin.  Discharge Exam: Vitals:   02/23/24 1522 02/23/24 1529  BP:  (!) 170/55  Pulse: (!) 57   Resp:  18  Temp:  (!) 97.3 F (36.3 C)  SpO2: 100%    General: Pt is alert, awake, not in acute  distress Cardiovascular: RRR, S1/S2 +, no rubs, no gallops Respiratory: CTA bilaterally, no wheezing, no rhonchi Abdominal: Soft, NT, ND, bowel sounds + Extremities: no edema, no cyanosis  Discharge Instructions  Discharge Instructions     Amb referral to AFIB Clinic   Complete by: As directed    Discharge instructions   Complete by: As directed    Please call call MD or return to ER for similar or worsening recurring problem that brought you to hospital or if any fever,nausea/vomiting,abdominal pain, uncontrolled pain, chest pain,  shortness of breath or any other alarming symptoms.  Please follow-up your doctor as instructed in a week time and call the office for appointment.  Please avoid alcohol, smoking, or any other illicit substance and maintain healthy habits including taking your regular medications as prescribed.  You were cared for by a hospitalist during your hospital stay. If you have any questions about your discharge medications or the care you received while you were in the hospital after you are discharged, you can call the unit and ask to speak with the hospitalist on call if the hospitalist that took care of you is not available.  Once you are discharged, your primary care physician will handle any further medical issues. Please note that NO REFILLS for any discharge medications will be authorized once you are discharged, as it is imperative that you return to your primary care physician (or establish a relationship with a primary care physician if you do not have one) for your aftercare needs so that they can reassess your need for medications and monitor your lab values   Increase activity slowly   Complete by: As directed       Allergies as of 02/23/2024   No Known Allergies      Medication List     TAKE these medications    acetaminophen  325 MG tablet Commonly known as: TYLENOL  Take 2 tablets (650 mg total) by mouth every 6 (six) hours as needed for mild  pain (or Fever >/= 101).   apixaban  5 MG Tabs tablet Commonly known as: ELIQUIS  Take 1 tablet (5 mg total) by mouth 2 (two) times daily.   atorvastatin  20 MG tablet Commonly known as: LIPITOR TAKE 1 TABLET BY MOUTH DAILY WITH THE EVENING MEAL What changed: See the new instructions.   levothyroxine  88 MCG tablet Commonly known as: SYNTHROID  TAKE 1 TABLET(88 MCG) BY MOUTH DAILY What changed: See the new instructions.   pentafluoroprop-tetrafluoroeth Aero Commonly known as: GEBAUERS Apply 1 application topically as needed (topical anesthesia for hemodialysis).   sevelamer  carbonate 800 MG tablet Commonly known as: RENVELA  Take 1,600 mg by mouth 3 (three) times daily.        Follow-up Information     Adella Norris, MD Follow up in 1 week(s).   Specialty: Internal Medicine Contact information: [address] [place] [address] [phone]                No Known Allergies  The results of significant diagnostics from this hospitalization (including imaging, microbiology, ancillary and laboratory) are listed below for reference.    Microbiology: Recent Results (from the past 240  hours)  MRSA Next Gen by PCR, Nasal     Status: None   Collection Time: 02/22/24  1:14 AM   Specimen: Nasal Mucosa; Nasal Swab  Result Value Ref Range Status   MRSA by PCR Next Gen NOT DETECTED NOT DETECTED Final    Comment: (NOTE) The GeneXpert MRSA Assay (FDA approved for NASAL specimens only), is one component of a comprehensive MRSA colonization surveillance program. It is not intended to diagnose MRSA infection nor to guide or monitor treatment for MRSA infections. Test performance is not FDA approved in patients less than 70 years old. Performed at [facility], [address] [place], [place] [address]     Procedures/Studies: ECHOCARDIOGRAM LIMITED Result Date: 02/22/2024    ECHOCARDIOGRAM LIMITED REPORT   Patient Name:   EDDER BELLANCA Date of Exam:  02/22/2024 Medical Rec #:  [id number]            Height:       62.0 in Accession #:    [id number]           Weight:       143.7 lb Date of Birth:  20-Jun-1956             BSA:          1.661 m Patient Age:    68 years             BP:           144/64 mmHg Patient Gender: M                    HR:           53 bpm. Exam Location:  Inpatient Procedure: Limited Echo, Cardiac Doppler and Color Doppler (Both Spectral and            Color Flow Doppler were utilized during procedure). Indications:    atrial flutter  History:        Patient has prior history of Echocardiogram examinations, most                 recent 02/20/2024. Risk Factors:Hypertension, Diabetes and                 Dyslipidemia.  Sonographer:    Philomena Daring Referring Phys: GAYATRI A ACHARYA IMPRESSIONS  1. Left ventricular ejection fraction, by estimation, is 50%. The left ventricle has low normal function. Left ventricular endocardial border not optimally defined to evaluate regional wall motion. There is mild left ventricular hypertrophy.  2. Right ventricular systolic function is normal. The right ventricular size is mildly enlarged. There is mildly elevated pulmonary artery systolic pressure. The estimated right ventricular systolic pressure is 37.5 mmHg.  3. Left atrial size was mildly dilated.  4. Right atrial size was mildly dilated.  5. The mitral valve is degenerative. Mild mitral valve regurgitation.  6. The aortic valve is tricuspid. There is mild calcification of the aortic valve. There is mild thickening of the aortic valve.  7. The inferior vena cava is dilated in size with <50% respiratory variability, suggesting right atrial pressure of 15 mmHg. FINDINGS  Left Ventricle: Left ventricular ejection fraction, by estimation, is 50%. The left ventricle has low normal function. Left ventricular endocardial border not optimally defined to evaluate regional wall motion. There is mild left ventricular hypertrophy. Right Ventricle: The right ventricular  size is mildly enlarged. Right ventricular systolic function is normal. There is mildly elevated pulmonary artery systolic pressure. The tricuspid  regurgitant velocity is 2.37 m/s, and with an assumed right atrial pressure of 15 mmHg, the estimated right ventricular systolic pressure is 37.5 mmHg. Left Atrium: Left atrial size was mildly dilated. Right Atrium: Right atrial size was mildly dilated. Pericardium: Trivial pericardial effusion is present. Mitral Valve: The mitral valve is degenerative in appearance. Mild mitral valve regurgitation. Tricuspid Valve: The tricuspid valve is grossly normal. Tricuspid valve regurgitation is trivial. Aortic Valve: The aortic valve is tricuspid. There is mild calcification of the aortic valve. There is mild thickening of the aortic valve. Venous: The inferior vena cava is dilated in size with less than 50% respiratory variability, suggesting right atrial pressure of 15 mmHg. LEFT VENTRICLE PLAX 2D LVIDd:         4.90 cm LVIDs:         3.40 cm LV PW:         1.00 cm LV IVS:        1.10 cm LVOT diam:     1.80 cm LVOT Area:     2.54 cm  IVC IVC diam: 2.50 cm LEFT ATRIUM         Index LA diam:    4.00 cm 2.41 cm/m  TRICUSPID VALVE TR Peak grad:   22.5 mmHg TR Vmax:        237.00 cm/s  SHUNTS Systemic Diam: 1.80 cm Soyla Merck MD Electronically signed by Soyla Merck MD Signature Date/Time: 02/22/2024/5:28:10 PM    Final    ECHOCARDIOGRAM COMPLETE Result Date: 02/20/2024    ECHOCARDIOGRAM REPORT   Patient Name:   MATHEU PLOEGER Date of Exam: 02/20/2024 Medical Rec #:  [id number]            Height:       62.0 in Accession #:    [id number]           Weight:       150.6 lb Date of Birth:  12-Jun-1956             BSA:          1.694 m Patient Age:    68 years             BP:           139/98 mmHg Patient Gender: M                    HR:           142 bpm. Exam Location:  Inpatient Procedure: 2D Echo, Cardiac Doppler and Color Doppler (Both Spectral and Color            Flow  Doppler were utilized during procedure). Indications:    Atrial Flutter I48.92  History:        Patient has prior history of Echocardiogram examinations, most                 recent 01/16/2021. CKD, stage 4; Risk Factors:Hypertension,                 Diabetes and Dyslipidemia.  Sonographer:    Thea Norlander RCS Referring Phys: WADDELL LABOR PARCELLS IMPRESSIONS  1. Left ventricular ejection fraction, by estimation, is 35 to 40%. The left ventricle has moderately decreased function. The left ventricle demonstrates global hypokinesis. There is mild left ventricular hypertrophy. Left ventricular diastolic parameters are indeterminate.  2. Right ventricular systolic function is mildly reduced. The right ventricular size is normal. There is moderately elevated pulmonary artery systolic pressure. The estimated  right ventricular systolic pressure is 48.4 mmHg.  3. The mitral valve is degenerative. Trivial mitral valve regurgitation.  4. Tricuspid valve regurgitation is moderate.  5. The aortic valve is tricuspid. Aortic valve regurgitation is not visualized. Aortic valve sclerosis/calcification is present, without any evidence of aortic stenosis.  6. The inferior vena cava is dilated in size with <50% respiratory variability, suggesting right atrial pressure of 15 mmHg. Conclusion(s)/Recommendation(s): Technically difficult study, patient tachycardic to 140s. Would consider repeat limited echo with contrast once heart rate improved. FINDINGS  Left Ventricle: Left ventricular ejection fraction, by estimation, is 35 to 40%. The left ventricle has moderately decreased function. The left ventricle demonstrates global hypokinesis. The left ventricular internal cavity size was normal in size. There is mild left ventricular hypertrophy. Left ventricular diastolic parameters are indeterminate. Right Ventricle: The right ventricular size is normal. No increase in right ventricular wall thickness. Right ventricular systolic function is  mildly reduced. There is moderately elevated pulmonary artery systolic pressure. The tricuspid regurgitant velocity is 2.89 m/s, and with an assumed right atrial pressure of 15 mmHg, the estimated right ventricular systolic pressure is 48.4 mmHg. Left Atrium: Left atrial size was normal in size. Right Atrium: Right atrial size was normal in size. Pericardium: There is no evidence of pericardial effusion. Mitral Valve: The mitral valve is degenerative in appearance. Mild to moderate mitral annular calcification. Trivial mitral valve regurgitation. Tricuspid Valve: The tricuspid valve is normal in structure. Tricuspid valve regurgitation is moderate. Aortic Valve: The aortic valve is tricuspid. Aortic valve regurgitation is not visualized. Aortic valve sclerosis/calcification is present, without any evidence of aortic stenosis. Aortic valve peak gradient measures 5.1 mmHg. Pulmonic Valve: The pulmonic valve was grossly normal. Pulmonic valve regurgitation is trivial. Aorta: The aortic root and ascending aorta are structurally normal, with no evidence of dilitation. Venous: The inferior vena cava is dilated in size with less than 50% respiratory variability, suggesting right atrial pressure of 15 mmHg. IAS/Shunts: The interatrial septum was not well visualized.  LEFT VENTRICLE PLAX 2D LVIDd:         4.70 cm LVIDs:         3.40 cm LV PW:         1.00 cm LV IVS:        0.90 cm LVOT diam:     2.00 cm LV SV:         36 LV SV Index:   21 LVOT Area:     3.14 cm  RIGHT VENTRICLE            IVC RV S prime:     9.25 cm/s  IVC diam: 2.40 cm TAPSE (M-mode): 1.1 cm LEFT ATRIUM             Index        RIGHT ATRIUM           Index LA diam:        4.50 cm 2.66 cm/m   RA Area:     15.70 cm LA Vol (A2C):   58.7 ml 34.64 ml/m  RA Volume:   38.20 ml  22.54 ml/m LA Vol (A4C):   49.1 ml 28.98 ml/m LA Biplane Vol: 53.5 ml 31.57 ml/m  AORTIC VALVE AV Area (Vmax): 2.28 cm AV Vmax:        113.00 cm/s AV Peak Grad:   5.1 mmHg LVOT  Vmax:      82.00 cm/s LVOT Vmean:     55.550 cm/s LVOT VTI:  0.114 m  AORTA Ao Root diam: 3.50 cm Ao Asc diam:  3.60 cm TRICUSPID VALVE TR Peak grad:   33.4 mmHg TR Vmax:        289.00 cm/s  SHUNTS Systemic VTI:  0.11 m Systemic Diam: 2.00 cm Lonni Nanas MD Electronically signed by Lonni Nanas MD Signature Date/Time: 02/20/2024/6:17:36 PM    Final    DG Chest Port 1 View Result Date: 02/20/2024 CLINICAL DATA:  Shortness of breath. EXAM: PORTABLE CHEST 1 VIEW COMPARISON:  March 14, 2021. FINDINGS: Stable cardiomegaly. Both lungs are clear. The visualized skeletal structures are unremarkable. IMPRESSION: No active disease. Electronically Signed   By: Lynwood Landy Raddle M.D.   On: 02/20/2024 14:31    Labs: BNP (last 3 results) No results for input(s): BNP in the last 8760 hours. Basic Metabolic Panel: Recent Labs  Lab 02/20/24 1340 02/21/24 0818 02/21/24 2032 02/22/24 0233 02/23/24 0311  NA 135 135 135 134* 129*  K 5.9* 6.9* 4.8 5.6* 5.8*  CL 95* 95* 98 96* 92*  CO2 24 24 23 24  21*  GLUCOSE 116* 116* 114* 94 95  BUN 55* 69* 32* 35* 56*  CREATININE 7.93* 9.33* 5.24* 5.67* 7.28*  CALCIUM  8.9 8.8* 8.3* 9.0 8.7*  MG 2.5*  --   --   --   --   PHOS 5.8*  --   --   --   --    Liver Function Tests: Recent Labs  Lab 02/21/24 0818  AST 11*  ALT 14  ALKPHOS 103  BILITOT 1.2  PROT 7.2  ALBUMIN  3.7   No results for input(s): LIPASE, AMYLASE in the last 168 hours. No results for input(s): AMMONIA in the last 168 hours. CBC: Recent Labs  Lab 02/20/24 1340 02/21/24 0818 02/22/24 0233 02/23/24 0311  WBC 6.8 5.5 11.1* 7.1  NEUTROABS 4.4  --   --   --   HGB 10.0* 9.9* 10.0* 9.9*  HCT 30.5* 30.4* 31.4* 30.4*  MCV 95.6 95.0 96.9 95.0  PLT 111* 114* 107* 102*   CBG: Recent Labs  Lab 02/22/24 0744 02/22/24 1111 02/22/24 1531 02/22/24 1939 02/23/24 0614  GLUCAP 81 108* 117* 155* 86  hyroid function studies Recent Labs    02/21/24 0857  TSH 9.759*    Urinalysis    Component Value Date/Time   COLORURINE YELLOW 06/06/2019 1530   APPEARANCEUR CLEAR 06/06/2019 1530   LABSPEC 1.014 06/06/2019 1530   PHURINE 6.0 06/06/2019 1530   GLUCOSEU 150 (A) 06/06/2019 1530   HGBUR SMALL (A) 06/06/2019 1530   BILIRUBINUR NEGATIVE 06/06/2019 1530   BILIRUBINUR neg 12/19/2016 1512   KETONESUR NEGATIVE 06/06/2019 1530   PROTEINUR >=300 (A) 06/06/2019 1530   UROBILINOGEN 0.2 12/19/2016 1512   UROBILINOGEN 1.0 03/29/2010 1944   NITRITE NEGATIVE 06/06/2019 1530   LEUKOCYTESUR NEGATIVE 06/06/2019 1530   Sepsis Labs Recent Labs  Lab 02/20/24 1340 02/21/24 0818 02/22/24 0233 02/23/24 0311  WBC 6.8 5.5 11.1* 7.1   Microbiology Recent Results (from the past 240 hours)  MRSA Next Gen by PCR, Nasal     Status: None   Collection Time: 02/22/24  1:14 AM   Specimen: Nasal Mucosa; Nasal Swab  Result Value Ref Range Status   MRSA by PCR Next Gen NOT DETECTED NOT DETECTED Final    Comment: (NOTE) The GeneXpert MRSA Assay (FDA approved for NASAL specimens only), is one component of a comprehensive MRSA colonization surveillance program. It is not intended to diagnose MRSA infection nor to guide or  monitor treatment for MRSA infections. Test performance is not FDA approved in patients less than 69 years old. Performed at [facility], [address], [place], [place] [address]    Time coordinating discharge: 35 minutes  SIGNED: Mennie LAMY, MD  [facility] 02/23/2024, 4:35 PM  If 7PM-7AM, please contact night-coverage [email or web address]

## 2024-02-23 NOTE — Telephone Encounter (Signed)
 Patient Product/process development scientist completed.    The patient is insured through [employer]. Patient has Medicare and is not eligible for a copay card, but may be able to apply for patient assistance or Medicare RX Payment Plan (Patient Must reach out to their plan, if eligible for payment plan), if available.    Ran test claim for Eliquis  5 mg and the current 30 day co-pay is $496.58 due to a $450.00 deductible.  Will be $47.00 once deductible is met.   This test claim was processed through [facility]- copay amounts may vary at other pharmacies due to pharmacy/plan contracts, or as the patient moves through the different stages of their insurance plan.     Reyes Sharps, CPHT Pharmacy Technician III Certified Patient Advocate [facility] Patient Advocate Team Direct Number: [phone]  Fax: [phone]

## 2024-02-23 NOTE — TOC Transition Note (Signed)
 Transition of Care [facility]) - Discharge Note   Patient Details  Name: Ryan Wells MRN: [id number] Date of Birth: 08/20/1955  [facility] CM/SW Contact:  Waddell Barnie Rama, RN Phone Number: 02/23/2024, 4:49 PM   Clinical Narrative:     For dc today, he has transport home.        Patient Goals and CMS Choice            Discharge Placement                       Discharge Plan and Services Additional resources added to the After Visit Summary for                                       Social Drivers of Health (SDOH) Interventions SDOH Screenings   Food Insecurity: No Food Insecurity (02/20/2024)  Housing: Low Risk  (02/20/2024)  Transportation Needs: Unmet Transportation Needs (02/20/2024)  Utilities: Not At Risk (02/20/2024)  Financial Resource Strain: Medium Risk (09/23/2023)  Social Connections: Socially Isolated (02/20/2024)  Tobacco Use: Medium Risk (09/23/2023)     Readmission Risk Interventions    02/23/2024    4:45 PM  Readmission Risk Prevention Plan  Transportation Screening Complete  Home Care Screening Complete  Medication Review (RN CM) Complete

## 2024-02-23 NOTE — Progress Notes (Signed)
 Heart Failure Navigator Progress Note  Assessed for Heart & Vascular TOC clinic readiness.  Patient does not meet criteria due to ESRD on hemodialysis. No HF TOC.   Navigator will sign off at this time.   Randie Bustle, BSN, Scientist, clinical (histocompatibility and immunogenetics) Only

## 2024-02-23 NOTE — Progress Notes (Addendum)
 [facility] Progress Note   Subjective:   Seen in room - out of ICU. No CP/dyspnea today. Remains on IV heparin . Echo 7/13 with EF 50%.  Objective Vitals:   02/23/24 0024 02/23/24 0313 02/23/24 0422 02/23/24 0807  BP: 137/66 (!) 132/53  (!) 136/57  Pulse: (!) 53 (!) 44  (!) 48  Resp: 15 16  17   Temp: 98.3 F (36.8 C) 98.5 F (36.9 C)  (!) 97.5 F (36.4 C)  TempSrc: Oral Oral  Oral  SpO2: 98% 98%  97%  Weight:   67.6 kg   Height:       Physical Exam General: Well appearing man, NAD. Room air Heart: Bradycardic, no murmur Lungs: CTA anteriorly Abdomen: soft Extremities: no LE edema Dialysis Access: AVF +t/b  Additional Objective Labs: Basic Metabolic Panel: Recent Labs  Lab 02/20/24 1340 02/21/24 0818 02/21/24 2032 02/22/24 0233 02/23/24 0311  NA 135   < > 135 134* 129*  K 5.9*   < > 4.8 5.6* 5.8*  CL 95*   < > 98 96* 92*  CO2 24   < > 23 24 21*  GLUCOSE 116*   < > 114* 94 95  BUN 55*   < > 32* 35* 56*  CREATININE 7.93*   < > 5.24* 5.67* 7.28*  CALCIUM  8.9   < > 8.3* 9.0 8.7*  PHOS 5.8*  --   --   --   --    < > = values in this interval not displayed.   Liver Function Tests: Recent Labs  Lab 02/21/24 0818  AST 11*  ALT 14  ALKPHOS 103  BILITOT 1.2  PROT 7.2  ALBUMIN  3.7   CBC: Recent Labs  Lab 02/20/24 1340 02/21/24 0818 02/22/24 0233 02/23/24 0311  WBC 6.8 5.5 11.1* 7.1  NEUTROABS 4.4  --   --   --   HGB 10.0* 9.9* 10.0* 9.9*  HCT 30.5* 30.4* 31.4* 30.4*  MCV 95.6 95.0 96.9 95.0  PLT 111* 114* 107* 102*   Studies/Results: ECHOCARDIOGRAM LIMITED Result Date: 02/22/2024    ECHOCARDIOGRAM LIMITED REPORT   Patient Name:   Ryan Wells Date of Exam: 02/22/2024 Medical Rec #:  [id number]            Height:       62.0 in Accession #:    [id number]           Weight:       143.7 lb Date of Birth:  [birth year]             BSA:          1.661 m Patient Age:    68 years             BP:           144/64 mmHg Patient Gender: M                     HR:           53 bpm. Exam Location:  Inpatient Procedure: Limited Echo, Cardiac Doppler and Color Doppler (Both Spectral and            Color Flow Doppler were utilized during procedure). Indications:    atrial flutter  History:        Patient has prior history of Echocardiogram examinations, most                 recent 02/20/2024. Risk Factors:Hypertension, Diabetes and  Dyslipidemia.  Sonographer:    Philomena Daring Referring Phys: GAYATRI A ACHARYA IMPRESSIONS  1. Left ventricular ejection fraction, by estimation, is 50%. The left ventricle has low normal function. Left ventricular endocardial border not optimally defined to evaluate regional wall motion. There is mild left ventricular hypertrophy.  2. Right ventricular systolic function is normal. The right ventricular size is mildly enlarged. There is mildly elevated pulmonary artery systolic pressure. The estimated right ventricular systolic pressure is 37.5 mmHg.  3. Left atrial size was mildly dilated.  4. Right atrial size was mildly dilated.  5. The mitral valve is degenerative. Mild mitral valve regurgitation.  6. The aortic valve is tricuspid. There is mild calcification of the aortic valve. There is mild thickening of the aortic valve.  7. The inferior vena cava is dilated in size with <50% respiratory variability, suggesting right atrial pressure of 15 mmHg. FINDINGS  Left Ventricle: Left ventricular ejection fraction, by estimation, is 50%. The left ventricle has low normal function. Left ventricular endocardial border not optimally defined to evaluate regional wall motion. There is mild left ventricular hypertrophy. Right Ventricle: The right ventricular size is mildly enlarged. Right ventricular systolic function is normal. There is mildly elevated pulmonary artery systolic pressure. The tricuspid regurgitant velocity is 2.37 m/s, and with an assumed right atrial pressure of 15 mmHg, the estimated right ventricular systolic  pressure is 37.5 mmHg. Left Atrium: Left atrial size was mildly dilated. Right Atrium: Right atrial size was mildly dilated. Pericardium: Trivial pericardial effusion is present. Mitral Valve: The mitral valve is degenerative in appearance. Mild mitral valve regurgitation. Tricuspid Valve: The tricuspid valve is grossly normal. Tricuspid valve regurgitation is trivial. Aortic Valve: The aortic valve is tricuspid. There is mild calcification of the aortic valve. There is mild thickening of the aortic valve. Venous: The inferior vena cava is dilated in size with less than 50% respiratory variability, suggesting right atrial pressure of 15 mmHg. LEFT VENTRICLE PLAX 2D LVIDd:         4.90 cm LVIDs:         3.40 cm LV PW:         1.00 cm LV IVS:        1.10 cm LVOT diam:     1.80 cm LVOT Area:     2.54 cm  IVC IVC diam: 2.50 cm LEFT ATRIUM         Index LA diam:    4.00 cm 2.41 cm/m  TRICUSPID VALVE TR Peak grad:   22.5 mmHg TR Vmax:        237.00 cm/s  SHUNTS Systemic Diam: 1.80 cm Soyla Merck MD Electronically signed by Soyla Merck MD Signature Date/Time: 02/22/2024/5:28:10 PM    Final    Medications:  heparin  1,100 Units/hr (02/22/24 1700)   sodium chloride  Stopped (02/21/24 2009)    atorvastatin   20 mg Oral QHS   Chlorhexidine  Gluconate Cloth  6 each Topical Q0600   insulin  aspart  0-15 Units Subcutaneous TID WC   levothyroxine   88 mcg Oral QAC breakfast   sevelamer  carbonate  1,600 mg Oral TID WC   sodium chloride  flush  3 mL Intravenous Q12H   sodium chloride  flush  3-10 mL Intravenous Q12H    Dialysis Orders MWF - [facility]  3:45hr, 400/A1.5, EDW 66.2kg  2K/2Ca bath, LUE AVF,  Hep 2200 - Mircera 50 mcg q 2 wks (last 6/20) - calcitriol  PO q HD   Assessment/Plan: New onset Aflutter with RVR: Amiodarone gtt started but  developed bradycardia/hypotension post HD. Currently sinus bradycardia. Cardiology following. 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 Cardizem  and metoprolol   stopped. Echo with LVEF 50%. ESRD: Continue HD on MWF schedule -> for HD today. Hyperkalemia. Will correct with HD. Follow low potassium diet.  Volume: No edema on exam, but low Na reflects volume excess and dilated IVC on echo. Low UF goal. 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  2HPTH: Ca/Phos ok. Continue sevelamer  as binder, resume calcitriol  q HD. DM. Insulin  per primary  Thrombocytopenia. Chronic since 04/2023 per OP records.    Izetta Boehringer, PA-C 02/23/2024, 9:14 AM  [facility]   Seen and examined independently.  Agree with note and exam as documented above by physician extender and as noted here.  General adult male in bed in no acute distress HEENT normocephalic atraumatic extraocular movements intact sclera anicteric Neck supple trachea midline Lungs clear to auscultation bilaterally normal work of breathing at rest  Heart S1S2 no rub Abdomen soft nontender nondistended Extremities no edema  Psych normal mood and affect Neuro alert and oriented x 3 provides hx and follows commands Access AVF with bruit and thrill   ESRD on HD - HD today.  MWF schedule.  I have checked in with the HD unit and they're getting him this AM (hypotension after last HD) - would choose an alternate to morphine  for pain  A flutter - meds per primary team and cardiology  Hyperkalemia - lokelma  again today. On HD. Discussed low K diet and renal diet is ordered    Katheryn JAYSON Saba, MD 02/23/2024  9:56 AM

## 2024-02-23 NOTE — TOC CM/SW Note (Addendum)
 Transition of Care [facility]) - Inpatient Brief Assessment   Patient Details  Name: Ryan Wells MRN: [id number] Date of Birth: Sep 01, 1955  [facility]) CM/SW Contact:    Waddell Barnie Rama, RN Phone Number: 02/23/2024, 4:47 PM   Clinical Narrative: Using video intrepreter number [id number]-From home with spouse, has PCP and insurance on file, states has no HH services in place at this time or DME at home.  States family member will transport them home at [employer] and family is support system, states gets medications from [facility].  Pta self ambulatory.   NCM informed him of the copay amt for eliquis , TOC pharmacy to fill the first 30 days free.    Transition of Care Asessment: Insurance and Status: Insurance coverage has been reviewed Patient has primary care physician: Yes Home environment has been reviewed: home with family Prior level of function:: indep Prior/Current Home Services: No current home services Social Drivers of Health Review: SDOH reviewed no interventions necessary Readmission risk has been reviewed: Yes Transition of care needs: transition of care needs identified, TOC will continue to follow

## 2024-02-23 NOTE — Progress Notes (Signed)
 Patient discharged to home, AVS reviewed with patient and family via interpreter. Prescriptions filled by [facility] pharmacy and provided to patient. Staff assisted patient out to the exit, and family provided transportation.

## 2024-02-23 NOTE — Progress Notes (Addendum)
 PHARMACY - ANTICOAGULATION CONSULT NOTE  Pharmacy Consult for Heparin  > Eliquis  Indication: atrial fibrillation  No Known Allergies  Patient Measurements: Height: 5' 2 (157.5 cm) Weight: 67.6 kg (149 lb 1.6 oz) IBW/kg (Calculated) : 54.6 HEPARIN  DW (KG): 68.3  Vital Signs: Temp: 97.5 F (36.4 C) (07/14 0807) Temp Source: Oral (07/14 0807) BP: 136/57 (07/14 0807) Pulse Rate: 48 (07/14 0807)  Labs: Recent Labs    02/20/24 1340 02/20/24 1707 02/21/24 0112 02/21/24 0818 02/21/24 2032 02/22/24 0233 02/23/24 0311  HGB 10.0*  --   --  9.9*  --  10.0* 9.9*  HCT 30.5*  --   --  30.4*  --  31.4* 30.4*  PLT 111*  --   --  114*  --  107* 102*  HEPARINUNFRC  --   --    < > 0.55  --  0.47 0.47  CREATININE 7.93*  --   --  9.33* 5.24* 5.67* 7.28*  TROPONINIHS 25* 25*  --   --   --   --   --    < > = values in this interval not displayed.    Estimated Creatinine Clearance: 8.1 mL/min (A) (by C-G formula based on SCr of 7.28 mg/dL (H)).  Assessment: 68 yr old male on IV heparin  for atrial fibrillation. Not on anticoagulation PTA.  Heparin  level remains therapeutic (0.47) on 1100 units/hr.  CBC low stable.   Addendum ~11am: Changing to Eliquis . ESRD but full dose due to weight >60 kg (EDW 66.2 kg) and age < 73 yrs old.  Goal of Therapy:  Heparin  level 0.3-0.7 units/ml Monitor platelets by anticoagulation protocol: Yes   Plan:  Continue heparin  drip at 1100 units/hr Daily heparin  level and CBC while on heparin . Follow up anticoagulation plans.  Addendum ~11am:   Changing to Eliquis  5 mg PO BID. IV heparin  to stop when giving first dose of Eliquis .  Genaro Zebedee Calin, RPh 02/23/2024,8:11 AM Addendum. 10:59 AM

## 2024-02-23 NOTE — TOC Progression Note (Signed)
 Transition of Care [facility]) - Progression Note    Patient Details  Name: JEET SHOUGH MRN: [id number] Date of Birth: 1955-10-12  Transition of Care [facility]) CM/SW Contact  Waddell Barnie Rama, RN Phone Number: 02/23/2024, 3:41 PM  Clinical Narrative:    Will be on eliquis , per pharmacy test claim-Ran test claim for Eliquis  5 mg and the current 30 day co-pay is $496.58 due to a $450.00 deductible.  Will be $47.00 once deductible is met.         Expected Discharge Plan and Services                                               Social Determinants of Health (SDOH) Interventions SDOH Screenings   Food Insecurity: No Food Insecurity (02/20/2024)  Housing: Low Risk  (02/20/2024)  Transportation Needs: Unmet Transportation Needs (02/20/2024)  Utilities: Not At Risk (02/20/2024)  Financial Resource Strain: Medium Risk (09/23/2023)  Social Connections: Socially Isolated (02/20/2024)  Tobacco Use: Medium Risk (09/23/2023)    Readmission Risk Interventions     No data to display

## 2024-02-24 LAB — HEPATITIS B SURFACE ANTIBODY, QUANTITATIVE: Hep B S AB Quant (Post): 187 m[IU]/mL

## 2024-02-24 NOTE — Progress Notes (Signed)
 Late Note Entry- February 24, 2024  Pt was d/c yesterday. Contacted [facility] this morning to be advised of pt's d/c date and that pt should resume care tomorrow.   Randine Mungo Renal Navigator [phone]

## 2024-02-24 NOTE — Discharge Planning (Signed)
 [facility] Patient Discharge Orders - [facility] CLINIC: [place]  Patient's name: Ryan Wells Admit/DC Dates: 02/20/2024 - 02/23/2024  DISCHARGE DIAGNOSES: A-flutter with RVR  --> started on Eliquis  Hypotension/bradycardia due to cardizem  -> improved with stopping Hypothyroidism  HD ORDER CHANGES: Heparin  change: no EDW Change: no Bath Change: no  ANEMIA MANAGEMENT: Aranesp : Given: no  ESA dose for discharge: mircera 30 mcg IV q 2 weeks, to start on 02/25/24 IV Iron  dose at discharge: per protocol Transfusion: Given: no  BONE/MINERAL MEDICATIONS: Hectorol /Calcitriol  change: no Sensipar/Parsabiv change: no  ACCESS INTERVENTION/CHANGE: no Details:  RECENT LABS: Recent Labs  Lab 02/20/24 1340 02/21/24 0818 02/21/24 2032 02/23/24 0311  HGB 10.0* 9.9*   < > 9.9*  NA 135 135   < > 129*  K 5.9* 6.9*   < > 5.8*  CALCIUM  8.9 8.8*   < > 8.7*  PHOS 5.8*  --   --   --   ALBUMIN   --  3.7  --   --    < > = values in this interval not displayed.    IV ANTIBIOTICS: no Details:  OTHER ANTICOAGULATION: - Being started on Eliquis  -> getting thru [facility] pharmacy per notes  OTHER/APPTS/LAB ORDERS: - Pls check weekly K - has been very high here - Reminded patient to see PCP, needs TSH repeated in 1 mo  D/C Meds to be reconciled by nurse after every discharge.  Completed By: Izetta Boehringer, PA-C [facility] Pager [phone]   Reviewed by: MD:______ RN_______

## 2024-02-27 ENCOUNTER — Emergency Department (HOSPITAL_COMMUNITY)
Admission: EM | Admit: 2024-02-27 | Discharge: 2024-02-27 | Disposition: A | Discharge status: Home / Self Care | Attending: Emergency Medicine | Admitting: Emergency Medicine

## 2024-02-27 DIAGNOSIS — Z992 Dependence on renal dialysis: Secondary | ICD-10-CM | POA: Insufficient documentation

## 2024-02-27 DIAGNOSIS — X58XXXA Exposure to other specified factors, initial encounter: Secondary | ICD-10-CM | POA: Diagnosis not present

## 2024-02-27 DIAGNOSIS — T82838A Hemorrhage of vascular prosthetic devices, implants and grafts, initial encounter: Secondary | ICD-10-CM | POA: Diagnosis not present

## 2024-02-27 DIAGNOSIS — E1122 Type 2 diabetes mellitus with diabetic chronic kidney disease: Secondary | ICD-10-CM | POA: Diagnosis not present

## 2024-02-27 DIAGNOSIS — I4892 Unspecified atrial flutter: Secondary | ICD-10-CM | POA: Insufficient documentation

## 2024-02-27 DIAGNOSIS — Z7901 Long term (current) use of anticoagulants: Secondary | ICD-10-CM | POA: Diagnosis not present

## 2024-02-27 DIAGNOSIS — N186 End stage renal disease: Secondary | ICD-10-CM | POA: Diagnosis not present

## 2024-02-27 DIAGNOSIS — S41112A Laceration without foreign body of left upper arm, initial encounter: Secondary | ICD-10-CM | POA: Diagnosis not present

## 2024-02-27 DIAGNOSIS — S4992XA Unspecified injury of left shoulder and upper arm, initial encounter: Secondary | ICD-10-CM | POA: Diagnosis present

## 2024-02-27 MED ORDER — LIDOCAINE HCL 2 % IJ SOLN
INTRAMUSCULAR | Status: AC
  Filled 2024-02-27: qty 20

## 2024-02-27 NOTE — ED Provider Notes (Signed)
 [place] EMERGENCY DEPARTMENT AT [facility] Provider Note   CSN: [id number] Arrival date & time: 02/27/24  1738   Patient presents with: Vascular Access Problem   Ryan Wells is a 68 y.o. male with past medical history of atrial flutter on Eliquis , ESRD on HD M/W/F, HLD, T2DM, and HLD who presents from dialysis for her persistent bleeding from fistula access site.  Patient's treatment ended at approximately 1415 and has had a pressure apparatus applied since then without control of bleeding.  Per patient this is never happened before.  Per chart review patient was recently discharged from the hospital with a course of Eliquis , however patient states that the name of the medicine is unfamiliar to him and he does not believe that he is taking it at home.  He states that he is given various medications at dialysis but he is not sure what they are.  Patient otherwise feels at his baseline with no other complaints.    Prior to Admission medications   Medication Sig Start Date End Date Taking? Authorizing Provider  acetaminophen  (TYLENOL ) 325 MG tablet Take 2 tablets (650 mg total) by mouth every 6 (six) hours as needed for mild pain (or Fever >/= 101). 04/07/21   Raenelle Coria, MD  apixaban  (ELIQUIS ) 5 MG TABS tablet Take 1 tablet (5 mg total) by mouth 2 (two) times daily. 02/23/24 03/24/24  Christobal Guadalajara, MD  atorvastatin  (LIPITOR) 20 MG tablet TAKE 1 TABLET BY MOUTH DAILY WITH THE EVENING MEAL Patient taking differently: Take 20 mg by mouth at bedtime. 05/23/23   Adella Norris, MD  levothyroxine  (SYNTHROID ) 88 MCG tablet TAKE 1 TABLET(88 MCG) BY MOUTH DAILY Patient taking differently: Take 88 mcg by mouth daily before breakfast. 05/23/23   Adella Norris, MD  pentafluoroprop-tetrafluoroeth JUANA) AERO Apply 1 application topically as needed (topical anesthesia for hemodialysis). Patient not taking: Reported on 02/20/2024 01/30/21   Katsadouros, Vasilios, MD   sevelamer  carbonate (RENVELA ) 800 MG tablet Take 1,600 mg by mouth 3 (three) times daily. 01/09/24   [provider]    Allergies: Patient has no known allergies.     Updated Vital Signs BP (!) 136/50   Pulse (!) 55   Temp 98.3 F (36.8 C) (Oral)   Resp 20   SpO2 100%   Physical Exam Vitals reviewed.  Constitutional:      General: He is not in acute distress.    Appearance: He is not toxic-appearing or diaphoretic.  HENT:     Head: Normocephalic and atraumatic.  Cardiovascular:     Rate and Rhythm: Normal rate and regular rhythm.     Heart sounds: No murmur heard.    No gallop.  Pulmonary:     Effort: Pulmonary effort is normal. No respiratory distress.  Abdominal:     General: Abdomen is flat.     Palpations: Abdomen is soft.  Skin:    General: Skin is warm.     Comments: Significant pulsatile bleeding from LUE AV-fistula with strong/bounding thrill, unable to fully assess for ulceration given level of bleeding  Neurological:     Mental Status: He is alert and oriented to person, place, and time. Mental status is at baseline.     (all labs ordered are listed, but only abnormal results are displayed) Labs Reviewed - No data to display  EKG: None  Radiology: No results found.   .Laceration Repair  Date/Time: 02/27/2024 7:32 PM  Performed by: Raoul Rake, MD Authorized by: Patt Alm Macho,  MD   Consent:    Consent obtained:  Emergent situation Anesthesia:    Anesthesia method:  Local infiltration   Local anesthetic:  Lidocaine  2% WITH epi Laceration details:    Location:  Shoulder/arm   Shoulder/arm location:  L upper arm   Length (cm):  0.2 Exploration:    Hemostasis achieved with:  Tied off vessels Skin repair:    Repair method:  Sutures   Suture size:  3-0   Suture material:  Nylon   Suture technique:  Figure eight   Number of sutures:  1 Approximation:    Approximation:  Close Repair type:    Repair type:   Simple Post-procedure details:    Dressing:  Bulky dressing   Procedure completion:  Tolerated well, no immediate complications    Medications Ordered in the ED  lidocaine  (XYLOCAINE ) 2 % (with pres) injection (  Given 02/27/24 2020)    Clinical Course as of 02/28/24 1053  Fri Feb 27, 2024  1918 Patient hemorrhaging through pressure dressing/quick clot placed appx 10 min prior. Suture placed with hemostasis achieved [AD]    Clinical Course User Index [AD] Raoul Rake, MD   Medical Decision Making Patient with the above history is presenting with persistent bleeding from his dialysis site after session was completed around 1415, now with several hours of direct pressure being held. On evaluation, patient has persistent pulsatile bleeding from his access site. Placed Quick Clot gauze and wrapped with new pressure bandage and consulted vascular surgery.   While waiting for vascular surgery evaluation, upon re-evaluation patient was found to be bleeding through compression bandage. A stitch was placed and hemostasis was achieved. Vascular surgery evaluated patient and provided number for clinic to call for fistulagram on Monday. A clean bandage was placed and patient was discharged with strict return precautions.     Final diagnoses:  Bleeding from dialysis shunt, initial encounter [facility])    ED Discharge Orders     None          Raoul Rake, MD 02/28/24 1053    Patt Alm Macho, MD 02/28/24 [phone]

## 2024-02-27 NOTE — ED Triage Notes (Addendum)
 BIB [facility] from dialysis, Patient is a M,W,F Dialysis patient. Brought in today due to uncontrolled hemorrhage to fistula access. Patients treatment ended at apporx 1415, without success of stopping the bleeding x 2 hours.   Patient did not want to be brought to the hospital and states he does not understand why he needs to be here, Have explained to the patient that it is important that we get his bleeding controlled.   118/60 64 100% RA

## 2024-02-27 NOTE — Discharge Instructions (Addendum)
 Hoy lo atendieron por sangrado en el acceso para dilisis. Durante su estancia, le monitorizamos sus constantes vitales, Product/process development scientist un examen fsico y Music therapist un punto de sutura en el brazo izquierdo para Photographer sangrado. Todo esto fue tranquilizador y, por el momento, no hay indicacin para realizar ms pruebas ni intervenciones en urgencias.  Cosas que debe hacer: - Acuda a su mdico de cabecera en las prximas 1 o 2 semanas. [place] al [phone] el lunes a primera hora para programar una fstula.  Regrese a urgencias si presenta sntomas nuevos o que empeoran, como sangrado a travs del vendaje, o si tiene alguna otra inquietud mdica grave.

## 2024-02-27 NOTE — Consult Note (Signed)
 Hospital Consult    Reason for Consult:  Left arm AVF bleeding Requesting Physician:  ED MRN #:  [id number]  History of Present Illness: This is a 68 y.o. male with end stage renal disease who undergoes dialysis MWF.  I dialysis today, he had post cannulation bleeding requiring a trip to the emergency department.  Manual pressure was held for a number of hours, however this did not lead to thrombosis.  A stitch was thrown, with good effect.  Vascular surgery was called regarding next steps.  On exam, Ryan Wells was resting comfortably.  He had no complaints.  He denied prior issues with bleeding after dialysis.  States his fistula has been doing well.  Denied symptoms of steal syndrome.  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  Did not get outpatient stress testing following hospitalization.  No chest pain since   Chronic kidney disease    on dialysis Tues, Thurs and Sat   Diabetes mellitus without complication (HCC)    Diabetes type 2, uncontrolled 11/25/2011   no meds, diet controlled per patient   Diabetic gastroparesis (HCC) 01/22/2017   Hypertension    no meds   Microalbuminuria 01/19/2017   Myocardial infarction [facility]) 2015    Past Surgical History:  Procedure Laterality Date   A/V FISTULAGRAM Left 06/04/2019   Procedure: A/V FISTULAGRAM;  Surgeon: Eliza Lonni RAMAN, MD;  Location: [facility] INVASIVE CV LAB;  Service: Cardiovascular;  Laterality: Left;   A/V FISTULAGRAM Left 10/15/2019   Procedure: A/V FISTULAGRAM;  Surgeon: Eliza Lonni RAMAN, MD;  Location: [facility] INVASIVE CV LAB;  Service: Cardiovascular;  Laterality: Left;   A/V FISTULAGRAM N/A 01/24/2021   Procedure: A/V FISTULAGRAM - Left Upper;  Surgeon: Magda Debby SAILOR, MD;  Location: [facility] INVASIVE CV LAB;  Service: Cardiovascular;  Laterality: N/A;   AV  FISTULA PLACEMENT Left 07/02/2018   Procedure: BRACHIOCEPHALIC ARTERIOVENOUS (AV) FISTULA CREATION LEFT ARM;  Surgeon: Serene Gaile ORN, MD;  Location: [facility] OR;  Service: Vascular;  Laterality: Left;   BASCILIC VEIN TRANSPOSITION Left 07/26/2019   Procedure: Bascilic Vein Transposition;  Surgeon: Eliza Lonni RAMAN, MD;  Location: [facility] OR;  Service: Vascular;  Laterality: Left;   COLONOSCOPY     EMBOLIZATION (CATH LAB) Left 06/04/2019   Procedure: EMBOLIZATION;  Surgeon: Eliza Lonni RAMAN, MD;  Location: [facility] INVASIVE CV LAB;  Service: Cardiovascular;  Laterality: Left;  LT ARM FISTULA/COMPETING BRANCH   EYE SURGERY Bilateral    cataracts x2   EYE SURGERY     INSERTION OF DIALYSIS CATHETER Right 06/08/2019   Procedure: INSERTION OF PALINDROME DIALYSIS CATHETER IN RIGHT INTERNAL JUGULAR;  Surgeon: Eliza Lonni RAMAN, MD;  Location: [facility] OR;  Service: Vascular;  Laterality: Right;   LIGATION OF ARTERIOVENOUS  FISTULA Left 07/26/2019   Procedure: Ligation Of BrachioCephalic  Fistula;  Surgeon: Eliza Lonni RAMAN, MD;  Location: [facility] OR;  Service: Vascular;  Laterality: Left;   NECK SURGERY     PERIPHERAL VASCULAR BALLOON ANGIOPLASTY Left 06/04/2019   Procedure: PERIPHERAL VASCULAR BALLOON ANGIOPLASTY;  Surgeon: Eliza Lonni RAMAN, MD;  Location: [facility] INVASIVE CV LAB;  Service: Cardiovascular;  Laterality: Left;  ARM FISTULA   PERIPHERAL VASCULAR BALLOON ANGIOPLASTY Left 10/15/2019   Procedure: PERIPHERAL VASCULAR BALLOON ANGIOPLASTY;  Surgeon: Eliza Lonni RAMAN, MD;  Location: [facility] INVASIVE CV LAB;  Service: Cardiovascular;  Laterality: Left;  arm fistula   TEE WITHOUT CARDIOVERSION N/A 01/19/2021   Procedure: TRANSESOPHAGEAL ECHOCARDIOGRAM (TEE);  Surgeon: Jeffrie Oneil BROCKS, MD;  Location: [facility] ENDOSCOPY;  Service: Cardiovascular;  Laterality: N/A;    No Known Allergies  Prior to Admission medications   Medication Sig Start Date End Date Taking? Authorizing Provider  acetaminophen   (TYLENOL ) 325 MG tablet Take 2 tablets (650 mg total) by mouth every 6 (six) hours as needed for mild pain (or Fever >/= 101). 04/07/21   Raenelle Coria, MD  apixaban  (ELIQUIS ) 5 MG TABS tablet Take 1 tablet (5 mg total) by mouth 2 (two) times daily. 02/23/24 03/24/24  Christobal Guadalajara, MD  atorvastatin  (LIPITOR) 20 MG tablet TAKE 1 TABLET BY MOUTH DAILY WITH THE EVENING MEAL Patient taking differently: Take 20 mg by mouth at bedtime. 05/23/23   Adella Norris, MD  levothyroxine  (SYNTHROID ) 88 MCG tablet TAKE 1 TABLET(88 MCG) BY MOUTH DAILY Patient taking differently: Take 88 mcg by mouth daily before breakfast. 05/23/23   Adella Norris, MD  pentafluoroprop-tetrafluoroeth JUANA) AERO Apply 1 application topically as needed (topical anesthesia for hemodialysis). Patient not taking: Reported on 02/20/2024 01/30/21   Katsadouros, Vasilios, MD  sevelamer  carbonate (RENVELA ) 800 MG tablet Take 1,600 mg by mouth 3 (three) times daily. 01/09/24   [provider]    Social History   Socioeconomic History   Marital status: Married    Spouse name: Ezella Flint Dodson Valdemar   Number of children: 3   Years of education: 1 year university   Highest education level: Not on file  Occupational History   Occupation: unemployed  Tobacco Use   Smoking status: Former   Smokeless tobacco: Never  Advertising account planner   Vaping status: Never Used  Substance and Sexual Activity   Alcohol use: Not Currently   Drug use: Never   Sexual activity: Yes  Other Topics Concern   Not on file  Social History Narrative   Lives in [place] with wife     2 sons live locally      Social Drivers of Health   Financial Resource Strain: Medium Risk (09/23/2023)   Overall Financial Resource Strain (CARDIA)    Difficulty of Paying Living Expenses: Somewhat hard  Food Insecurity: No Food Insecurity (02/20/2024)   Hunger Vital Sign    Worried About Running Out of Food in the Last Year: Never true     Ran Out of Food in the Last Year: Never true  Transportation Needs: Unmet Transportation Needs (02/20/2024)   PRAPARE - Administrator, Civil Service (Medical): Yes    Lack of Transportation (Non-Medical): Yes  Physical Activity: Not on file  Stress: Not on file  Social Connections: Socially Isolated (02/20/2024)   Social Connection and Isolation Panel    Frequency of Communication with Friends and Family: Never    Frequency of Social Gatherings with Friends and Family: Never    Attends Religious Services: Never    Database administrator or Organizations: No    Attends Banker Meetings: Never    Marital Status: Never married  Intimate Partner Violence: Unknown (02/20/2024)   Humiliation, Afraid, Rape, and Kick questionnaire    Fear of Current or Ex-Partner: No    Emotionally Abused: No    Physically Abused: Not on file    Sexually Abused: No   Family History  Problem Relation Age of Onset   Heart disease Mother        cause of  death--CHF?   Diabetes Father    Kidney disease Father        Dialysis for kidney failure   Diabetes Sister    Diabetes Brother    Colon cancer Neg Hx    Stomach cancer Neg Hx    Esophageal cancer Neg Hx     ROS: Otherwise negative unless mentioned in HPI  Physical Examination  Vitals:   02/27/24 1751  BP: (!) 155/64  Pulse: 63  Resp: 20  Temp: 98.3 F (36.8 C)  SpO2: 100%   There is no height or weight on file to calculate BMI.  General:  WDWN in NAD Gait: Not observed HENT: WNL, normocephalic Pulmonary: normal non-labored breathing, without Rales, rhonchi,  wheezing Cardiac: regular Abdomen:  soft, NT/ND, no masses Skin: without rashes Vascular Exam/Pulses: 2+ Extremities: without ischemic changes, without Gangrene , without cellulitis; without open wounds;  Left arm with fistula.  Light pulsatility, nice thrill Musculoskeletal: no muscle wasting or atrophy  Neurologic: A&O X 3;  No focal weakness or  paresthesias are detected; speech is fluent/normal Psychiatric:  The pt has Normal affect. Lymph:  Unremarkable  CBC    Component Value Date/Time   WBC 7.1 02/23/2024 0311   RBC 3.20 (L) 02/23/2024 0311   HGB 9.9 (L) 02/23/2024 0311   HGB 10.7 (L) 09/30/2023 0921   HCT 30.4 (L) 02/23/2024 0311   HCT 31.7 (L) 09/30/2023 0921   PLT 102 (L) 02/23/2024 0311   PLT 82 (LL) 09/30/2023 0921   MCV 95.0 02/23/2024 0311   MCV 95 09/30/2023 0921   MCH 30.9 02/23/2024 0311   MCHC 32.6 02/23/2024 0311   RDW 14.6 02/23/2024 0311   RDW 13.1 09/30/2023 0921   LYMPHSABS 1.6 02/20/2024 1340   LYMPHSABS 1.1 09/30/2023 0921   MONOABS 0.8 02/20/2024 1340   EOSABS 0.1 02/20/2024 1340   EOSABS 0.0 09/30/2023 0921   BASOSABS 0.1 02/20/2024 1340   BASOSABS 0.1 09/30/2023 0921    BMET    Component Value Date/Time   NA 129 (L) 02/23/2024 0311   NA 137 09/30/2023 0921   K 5.8 (H) 02/23/2024 0311   CL 92 (L) 02/23/2024 0311   CO2 21 (L) 02/23/2024 0311   GLUCOSE 95 02/23/2024 0311   BUN 56 (H) 02/23/2024 0311   BUN 84 (HH) 09/30/2023 0921   CREATININE 7.28 (H) 02/23/2024 0311   CREATININE 0.95 12/06/2013 1634   CALCIUM  8.7 (L) 02/23/2024 0311   CALCIUM  8.1 (L) 04/09/2018 1447   GFRNONAA 8 (L) 02/23/2024 0311   GFRNONAA 87 12/06/2013 1634   GFRAA 8 (L) 06/09/2019 0517   GFRAA >89 12/06/2013 1634    COAGS: Lab Results  Component Value Date   INR 1.1 03/19/2021       ASSESSMENT/PLAN: This is a 68 y.o. male with end-stage renal disease with hemorrhage status post dialysis.  This was controlled with the use of a stitch which was placed by the emergency department.  On physical exam, he was resting comfortably.  He had a lightly pulsatile fistula with a nice thrill.  I am unsure as to why he had the post cannulation bleeding as the fistula does not appear to have a significant amount of central stenosis regardless, patient needs fistulogram prior to receiving dialysis again.  I talked to  the patient, and also called his son.  The number for our outpatient fistulogram's was given to him. [phone]  I asked him to call Monday morning.   Fonda FORBES Rim MD MS  [facility] [phone] 02/27/2024  7:35 PM

## 2024-03-07 NOTE — Progress Notes (Deleted)
 Cardiology Office Note    Date:  03/07/2024  ID:  Ryan, Wells February 06, 1956, MRN [id number] PCP:  Adella Norris, MD  Cardiologist:  Lonni LITTIE Nanas, MD  Electrophysiologist:  None   Chief Complaint: ***  History of Present Illness: .    Ryan Wells is a 68 y.o. male with visit-pertinent history of atypical atrial flutter, symptomatic bradycardia, syncope, ESRD on dialysis, hypertension, T2DM, MSSA bacteremia.  Patient previously admitted in 01/2021 with MSSA bacteremia, TEE was negative for vegetation.  TTE on 01/16/2021 showed EF 55 to 60%, normal RV function, no significant valvular disease.  TEE on 01/19/2021 showed EF 60 to 65%, normal RV function, no significant valvular disease.  Patient was previously seen by Dr. Barnetta in 08/2023 for chest pain during dialysis.  Patient described a left-sided chest pressure that can last an entire dialysis session.  Patient reported this resolved after dialysis ended.  Patient reported a syncopal episode 1 month prior that occurred when he was walking into his home following dialysis.  He reported he collapsed on the floor and was unconscious for hours, reportedly hit his head on the doorknob when he fell.  Patient reported that he had collapsed 5-6 times over the prior 5 years, always occurring after dialysis.  PET stress, echocardiogram and 2 weeks Zio patch was ordered however was not completed.  On 02/20/2024 patient presented to [facility], ED from dialysis, complaining of weakness and tachycardia with heart rates in the 140s.  Patient reported having some weakness over the last several days.  He denied any shortness of breath, trauma or recent chest pain.  He denied any missed dialysis appointments.  Patient received adenosine  for narrow complex tachycardia which previously seem to result in sinus rhythm however patient had recurrent narrow complex tachycardia.  ED workup including a stable CBC, chest x-ray with no active  disease, rhythm strip post adenosine  appeared to be atypical flutter versus atrial fibrillation.  TTE on 7/11 in setting of tachyarrhythmia noted LVEF 35 to 40% with global hypokinesis, this was obtained while patient had heart rate in the 140s.  Patient was initially treated with diltiazem  but stopped on 7/12 with bradycardia, following HD on 7/12 patient with heart rate as low as 28 with hypotension, he was given 1 mg of atropine  as well as calcium  gluconate with positive heart rate response, patient was not restarted on rate controlling agents and was converted to Eliquis .  A limited study was completed on 7/13 with slower rates this indicated LVEF 50% with mild LVH, RA pressure elevated, estimated RV systolic pressure 37.5.  Bradycardia:   Labwork independently reviewed:   ROS: .   *** denies chest pain, shortness of breath, lower extremity edema, fatigue, palpitations, melena, hematuria, hemoptysis, diaphoresis, weakness, presyncope, syncope, orthopnea, and PND.  All other systems are reviewed and otherwise negative.  Studies Reviewed: Ryan Wells    EKG:  EKG is ordered today, personally reviewed, demonstrating ***     CV Studies: Cardiac studies reviewed are outlined and summarized above. Otherwise please see EMR for full report. Cardiac Studies & Procedures   ______________________________________________________________________________________________   STRESS TESTS  MYOCARDIAL PERFUSION IMAGING 01/07/2020  Interpretation Summary  Nuclear stress EF: 66%. The left ventricular ejection fraction is hyperdynamic (>65%).  This is a low risk study. No evidence of ischemia or previous infarction  The study is normal.   ECHOCARDIOGRAM  ECHOCARDIOGRAM LIMITED 02/22/2024  Narrative ECHOCARDIOGRAM LIMITED REPORT    Patient Name:   Ryan Wells  Date of Exam: 02/22/2024 Medical Rec #:  [id number]            Height:       62.0 in Accession #:    [id number]           Weight:        143.7 lb Date of Birth:  [birth year]             BSA:          1.661 m Patient Age:    68 years             BP:           144/64 mmHg Patient Gender: M                    HR:           53 bpm. Exam Location:  Inpatient  Procedure: Limited Echo, Cardiac Doppler and Color Doppler (Both Spectral and Color Flow Doppler were utilized during procedure).  Indications:    atrial flutter  History:        Patient has prior history of Echocardiogram examinations, most recent 02/20/2024. Risk Factors:Hypertension, Diabetes and Dyslipidemia.  Sonographer:    Philomena Daring Referring Phys: GAYATRI A ACHARYA  IMPRESSIONS   1. Left ventricular ejection fraction, by estimation, is 50%. The left ventricle has low normal function. Left ventricular endocardial border not optimally defined to evaluate regional wall motion. There is mild left ventricular hypertrophy. 2. Right ventricular systolic function is normal. The right ventricular size is mildly enlarged. There is mildly elevated pulmonary artery systolic pressure. The estimated right ventricular systolic pressure is 37.5 mmHg. 3. Left atrial size was mildly dilated. 4. Right atrial size was mildly dilated. 5. The mitral valve is degenerative. Mild mitral valve regurgitation. 6. The aortic valve is tricuspid. There is mild calcification of the aortic valve. There is mild thickening of the aortic valve. 7. The inferior vena cava is dilated in size with <50% respiratory variability, suggesting right atrial pressure of 15 mmHg.  FINDINGS Left Ventricle: Left ventricular ejection fraction, by estimation, is 50%. The left ventricle has low normal function. Left ventricular endocardial border not optimally defined to evaluate regional wall motion. There is mild left ventricular hypertrophy.  Right Ventricle: The right ventricular size is mildly enlarged. Right ventricular systolic function is normal. There is mildly elevated pulmonary artery systolic  pressure. The tricuspid regurgitant velocity is 2.37 m/s, and with an assumed right atrial pressure of 15 mmHg, the estimated right ventricular systolic pressure is 37.5 mmHg.  Left Atrium: Left atrial size was mildly dilated.  Right Atrium: Right atrial size was mildly dilated.  Pericardium: Trivial pericardial effusion is present.  Mitral Valve: The mitral valve is degenerative in appearance. Mild mitral valve regurgitation.  Tricuspid Valve: The tricuspid valve is grossly normal. Tricuspid valve regurgitation is trivial.  Aortic Valve: The aortic valve is tricuspid. There is mild calcification of the aortic valve. There is mild thickening of the aortic valve.  Venous: The inferior vena cava is dilated in size with less than 50% respiratory variability, suggesting right atrial pressure of 15 mmHg.  LEFT VENTRICLE PLAX 2D LVIDd:         4.90 cm LVIDs:         3.40 cm LV PW:         1.00 cm LV IVS:        1.10 cm LVOT diam:     1.80 cm LVOT Area:  2.54 cm   IVC IVC diam: 2.50 cm  LEFT ATRIUM         Index LA diam:    4.00 cm 2.41 cm/m TRICUSPID VALVE TR Peak grad:   22.5 mmHg TR Vmax:        237.00 cm/s  SHUNTS Systemic Diam: 1.80 cm  Soyla Merck MD Electronically signed by Soyla Merck MD Signature Date/Time: 02/22/2024/5:28:10 PM    Final   TEE  ECHO TEE 01/19/2021  Narrative TRANSESOPHOGEAL ECHO REPORT    Patient Name:   Ryan Wells Date of Exam: 01/19/2021 Medical Rec #:  [id number]            Height:       62.0 in Accession #:    [id number]           Weight:       154.3 lb Date of Birth:  [birth year]             BSA:          1.712 m Patient Age:    65 years             BP:           116/40 mmHg Patient Gender: M                    HR:           63 bpm. Exam Location:  Inpatient  Procedure: Transesophageal Echo and Color Doppler  Indications:     Bacteremia  History:         Patient has prior history of Echocardiogram  examinations, most recent 01/16/2021.  Sonographer:     Charlie Jointer Referring Phys:  [id number] HAO MENG Diagnosing Phys: Oneil Parchment MD  PROCEDURE: After discussion of the risks and benefits of a TEE, an informed consent was obtained from the patient. The transesophogeal probe was passed without difficulty through the esophogus of the patient. Imaged were obtained with the patient in a left lateral decubitus position. Local oropharyngeal anesthetic was provided with viscous lidocaine . Sedation performed by different physician. The patient was monitored while under deep sedation. Anesthestetic sedation was provided intravenously by Anesthesiology: 223mg  of Propofol . Image quality was adequate. The patient's vital signs; including heart rate, blood pressure, and oxygen saturation; remained stable throughout the procedure. The patient developed no complications during the procedure.  IMPRESSIONS   1. Left ventricular ejection fraction, by estimation, is 60 to 65%. The left ventricle has normal function. The left ventricle has no regional wall motion abnormalities. 2. Right ventricular systolic function is normal. The right ventricular size is normal. 3. No left atrial/left atrial appendage thrombus was detected. 4. The mitral valve is normal in structure. Trivial mitral valve regurgitation. No evidence of mitral stenosis. 5. The aortic valve is normal in structure. Aortic valve regurgitation is not visualized. No aortic stenosis is present. 6. There is mild (Grade II) plaque. 7. The inferior vena cava is normal in size with greater than 50% respiratory variability, suggesting right atrial pressure of 3 mmHg.  Conclusion(s)/Recommendation(s): No evidence of vegetation/infective endocarditis on this transesophageal echocardiogram.  FINDINGS Left Ventricle: Left ventricular ejection fraction, by estimation, is 60 to 65%. The left ventricle has normal function. The left ventricle has no regional  wall motion abnormalities. The left ventricular internal cavity size was normal in size. There is no left ventricular hypertrophy.  Right Ventricle: The right ventricular size is normal. No increase in right ventricular wall thickness.  Right ventricular systolic function is normal.  Left Atrium: Left atrial size was normal in size. No left atrial/left atrial appendage thrombus was detected.  Right Atrium: Right atrial size was normal in size.  Pericardium: There is no evidence of pericardial effusion.  Mitral Valve: The mitral valve is normal in structure. Trivial mitral valve regurgitation. No evidence of mitral valve stenosis.  Tricuspid Valve: The tricuspid valve is normal in structure. Tricuspid valve regurgitation is mild . No evidence of tricuspid stenosis.  Aortic Valve: The aortic valve is normal in structure. Aortic valve regurgitation is not visualized. No aortic stenosis is present.  Pulmonic Valve: The pulmonic valve was normal in structure. Pulmonic valve regurgitation is not visualized. No evidence of pulmonic stenosis.  Aorta: The aortic root was not well visualized. There is mild (Grade II) plaque.  Venous: The inferior vena cava is normal in size with greater than 50% respiratory variability, suggesting right atrial pressure of 3 mmHg.  IAS/Shunts: No atrial level shunt detected by color flow Doppler.  Oneil Parchment MD Electronically signed by Oneil Parchment MD Signature Date/Time: 01/19/2021/4:48:29 PM    Final        ______________________________________________________________________________________________       Current Reported Medications:.    No outpatient medications have been marked as taking for the 03/09/24 encounter (Appointment) with Arpi Diebold D, NP.    Physical Exam:    VS:  There were no vitals taken for this visit.   Wt Readings from Last 3 Encounters:  02/23/24 146 lb 13.2 oz (66.6 kg)  09/23/23 143 lb (64.9 kg)  08/26/23 147 lb (66.7  kg)    GEN: Well nourished, well developed in no acute distress NECK: No JVD; No carotid bruits CARDIAC: ***RRR, no murmurs, rubs, gallops RESPIRATORY:  Clear to auscultation without rales, wheezing or rhonchi  ABDOMEN: Soft, non-tender, non-distended EXTREMITIES:  No edema; No acute deformity     Asessement and Plan:.     ***     Disposition: F/u with ***  Signed, Joseeduardo Brix D Liller Yohn, NP

## 2024-03-08 ENCOUNTER — Other Ambulatory Visit: Payer: Self-pay

## 2024-03-08 ENCOUNTER — Emergency Department (HOSPITAL_COMMUNITY)

## 2024-03-08 ENCOUNTER — Inpatient Hospital Stay (HOSPITAL_COMMUNITY)
Admission: EM | Admit: 2024-03-08 | Discharge: 2024-03-12 | DRG: 640 | Disposition: A | Discharge status: Home Health | Attending: Internal Medicine | Admitting: Internal Medicine

## 2024-03-08 ENCOUNTER — Encounter (HOSPITAL_COMMUNITY): Payer: Self-pay

## 2024-03-08 ENCOUNTER — Inpatient Hospital Stay (HOSPITAL_COMMUNITY)

## 2024-03-08 DIAGNOSIS — E1143 Type 2 diabetes mellitus with diabetic autonomic (poly)neuropathy: Secondary | ICD-10-CM | POA: Diagnosis present

## 2024-03-08 DIAGNOSIS — I132 Hypertensive heart and chronic kidney disease with heart failure and with stage 5 chronic kidney disease, or end stage renal disease: Secondary | ICD-10-CM | POA: Diagnosis present

## 2024-03-08 DIAGNOSIS — I495 Sick sinus syndrome: Secondary | ICD-10-CM | POA: Diagnosis present

## 2024-03-08 DIAGNOSIS — I952 Hypotension due to drugs: Secondary | ICD-10-CM | POA: Diagnosis present

## 2024-03-08 DIAGNOSIS — Z5986 Financial insecurity: Secondary | ICD-10-CM

## 2024-03-08 DIAGNOSIS — D631 Anemia in chronic kidney disease: Secondary | ICD-10-CM | POA: Diagnosis present

## 2024-03-08 DIAGNOSIS — E1169 Type 2 diabetes mellitus with other specified complication: Secondary | ICD-10-CM | POA: Diagnosis present

## 2024-03-08 DIAGNOSIS — R001 Bradycardia, unspecified: Secondary | ICD-10-CM | POA: Diagnosis not present

## 2024-03-08 DIAGNOSIS — E039 Hypothyroidism, unspecified: Secondary | ICD-10-CM | POA: Diagnosis present

## 2024-03-08 DIAGNOSIS — Z992 Dependence on renal dialysis: Secondary | ICD-10-CM

## 2024-03-08 DIAGNOSIS — I5020 Unspecified systolic (congestive) heart failure: Secondary | ICD-10-CM | POA: Diagnosis not present

## 2024-03-08 DIAGNOSIS — N186 End stage renal disease: Secondary | ICD-10-CM | POA: Diagnosis present

## 2024-03-08 DIAGNOSIS — E875 Hyperkalemia: Principal | ICD-10-CM | POA: Diagnosis present

## 2024-03-08 DIAGNOSIS — Z6829 Body mass index (BMI) 29.0-29.9, adult: Secondary | ICD-10-CM

## 2024-03-08 DIAGNOSIS — I10 Essential (primary) hypertension: Secondary | ICD-10-CM | POA: Diagnosis not present

## 2024-03-08 DIAGNOSIS — Z79890 Hormone replacement therapy (postmenopausal): Secondary | ICD-10-CM

## 2024-03-08 DIAGNOSIS — E1122 Type 2 diabetes mellitus with diabetic chronic kidney disease: Secondary | ICD-10-CM | POA: Diagnosis present

## 2024-03-08 DIAGNOSIS — I471 Supraventricular tachycardia, unspecified: Secondary | ICD-10-CM | POA: Diagnosis present

## 2024-03-08 DIAGNOSIS — I4892 Unspecified atrial flutter: Secondary | ICD-10-CM | POA: Diagnosis not present

## 2024-03-08 DIAGNOSIS — R000 Tachycardia, unspecified: Secondary | ICD-10-CM | POA: Diagnosis not present

## 2024-03-08 DIAGNOSIS — Z841 Family history of disorders of kidney and ureter: Secondary | ICD-10-CM

## 2024-03-08 DIAGNOSIS — D696 Thrombocytopenia, unspecified: Secondary | ICD-10-CM | POA: Diagnosis present

## 2024-03-08 DIAGNOSIS — I5022 Chronic systolic (congestive) heart failure: Secondary | ICD-10-CM | POA: Diagnosis present

## 2024-03-08 DIAGNOSIS — I5030 Unspecified diastolic (congestive) heart failure: Secondary | ICD-10-CM | POA: Diagnosis not present

## 2024-03-08 DIAGNOSIS — I4891 Unspecified atrial fibrillation: Secondary | ICD-10-CM | POA: Diagnosis not present

## 2024-03-08 DIAGNOSIS — M4807 Spinal stenosis, lumbosacral region: Secondary | ICD-10-CM | POA: Diagnosis present

## 2024-03-08 DIAGNOSIS — Z8249 Family history of ischemic heart disease and other diseases of the circulatory system: Secondary | ICD-10-CM

## 2024-03-08 DIAGNOSIS — Z91158 Patient's noncompliance with renal dialysis for other reason: Secondary | ICD-10-CM

## 2024-03-08 DIAGNOSIS — E663 Overweight: Secondary | ICD-10-CM | POA: Diagnosis present

## 2024-03-08 DIAGNOSIS — I428 Other cardiomyopathies: Secondary | ICD-10-CM | POA: Diagnosis not present

## 2024-03-08 DIAGNOSIS — M4647 Discitis, unspecified, lumbosacral region: Secondary | ICD-10-CM | POA: Diagnosis present

## 2024-03-08 DIAGNOSIS — T461X5A Adverse effect of calcium-channel blockers, initial encounter: Secondary | ICD-10-CM | POA: Diagnosis present

## 2024-03-08 DIAGNOSIS — K3184 Gastroparesis: Secondary | ICD-10-CM | POA: Diagnosis present

## 2024-03-08 DIAGNOSIS — I252 Old myocardial infarction: Secondary | ICD-10-CM | POA: Diagnosis not present

## 2024-03-08 DIAGNOSIS — Z5982 Transportation insecurity: Secondary | ICD-10-CM

## 2024-03-08 DIAGNOSIS — E872 Acidosis, unspecified: Secondary | ICD-10-CM | POA: Diagnosis present

## 2024-03-08 DIAGNOSIS — N2581 Secondary hyperparathyroidism of renal origin: Secondary | ICD-10-CM | POA: Diagnosis present

## 2024-03-08 DIAGNOSIS — R5381 Other malaise: Secondary | ICD-10-CM | POA: Diagnosis not present

## 2024-03-08 DIAGNOSIS — Z79899 Other long term (current) drug therapy: Secondary | ICD-10-CM

## 2024-03-08 DIAGNOSIS — E785 Hyperlipidemia, unspecified: Secondary | ICD-10-CM | POA: Diagnosis present

## 2024-03-08 DIAGNOSIS — I2510 Atherosclerotic heart disease of native coronary artery without angina pectoris: Secondary | ICD-10-CM | POA: Diagnosis present

## 2024-03-08 DIAGNOSIS — Z87891 Personal history of nicotine dependence: Secondary | ICD-10-CM

## 2024-03-08 DIAGNOSIS — I484 Atypical atrial flutter: Secondary | ICD-10-CM | POA: Diagnosis present

## 2024-03-08 DIAGNOSIS — Z833 Family history of diabetes mellitus: Secondary | ICD-10-CM

## 2024-03-08 DIAGNOSIS — I472 Ventricular tachycardia, unspecified: Secondary | ICD-10-CM | POA: Diagnosis present

## 2024-03-08 DIAGNOSIS — R531 Weakness: Secondary | ICD-10-CM | POA: Diagnosis not present

## 2024-03-08 DIAGNOSIS — Z7901 Long term (current) use of anticoagulants: Secondary | ICD-10-CM

## 2024-03-08 DIAGNOSIS — Z603 Acculturation difficulty: Secondary | ICD-10-CM | POA: Diagnosis present

## 2024-03-08 LAB — BASIC METABOLIC PANEL WITH GFR
Anion gap: 17 — ABNORMAL HIGH (ref 5–15)
BUN: 65 mg/dL — ABNORMAL HIGH (ref 8–23)
CO2: 22 mmol/L (ref 22–32)
Calcium: 9.5 mg/dL (ref 8.9–10.3)
Chloride: 96 mmol/L — ABNORMAL LOW (ref 98–111)
Creatinine, Ser: 8.4 mg/dL — ABNORMAL HIGH (ref 0.61–1.24)
GFR, Estimated: 6 mL/min — ABNORMAL LOW (ref >60)
Glucose, Bld: 117 mg/dL — ABNORMAL HIGH (ref 70–99)
Potassium: 6.5 mmol/L (ref 3.5–5.1)
Sodium: 135 mmol/L (ref 135–145)

## 2024-03-08 LAB — COMPREHENSIVE METABOLIC PANEL WITH GFR
ALT: 26 U/L (ref 0–44)
AST: 27 U/L (ref 15–41)
Albumin: 4 g/dL (ref 3.5–5.0)
Alkaline Phosphatase: 137 U/L — ABNORMAL HIGH (ref 38–126)
Anion gap: 17 — ABNORMAL HIGH (ref 5–15)
BUN: 65 mg/dL — ABNORMAL HIGH (ref 8–23)
CO2: 24 mmol/L (ref 22–32)
Calcium: 9.1 mg/dL (ref 8.9–10.3)
Chloride: 94 mmol/L — ABNORMAL LOW (ref 98–111)
Creatinine, Ser: 8.52 mg/dL — ABNORMAL HIGH (ref 0.61–1.24)
GFR, Estimated: 6 mL/min — ABNORMAL LOW (ref >60)
Glucose, Bld: 125 mg/dL — ABNORMAL HIGH (ref 70–99)
Potassium: >7.5 mmol/L (ref 3.5–5.1)
Sodium: 135 mmol/L (ref 135–145)
Total Bilirubin: 1.3 mg/dL — ABNORMAL HIGH (ref 0.0–1.2)
Total Protein: 7.4 g/dL (ref 6.5–8.1)

## 2024-03-08 LAB — CBC WITH DIFFERENTIAL/PLATELET
Abs Immature Granulocytes: 0.04 K/uL (ref 0.00–0.07)
Basophils Absolute: 0.1 K/uL (ref 0.0–0.1)
Basophils Relative: 1 %
Eosinophils Absolute: 0 K/uL (ref 0.0–0.5)
Eosinophils Relative: 0 %
HCT: 34 % — ABNORMAL LOW (ref 39.0–52.0)
Hemoglobin: 10.7 g/dL — ABNORMAL LOW (ref 13.0–17.0)
Immature Granulocytes: 1 %
Lymphocytes Relative: 22 %
Lymphs Abs: 1.8 K/uL (ref 0.7–4.0)
MCH: 31.6 pg (ref 26.0–34.0)
MCHC: 31.5 g/dL (ref 30.0–36.0)
MCV: 100.3 fL — ABNORMAL HIGH (ref 80.0–100.0)
Monocytes Absolute: 0.8 K/uL (ref 0.1–1.0)
Monocytes Relative: 10 %
Neutro Abs: 5.5 K/uL (ref 1.7–7.7)
Neutrophils Relative %: 66 %
Platelets: 139 K/uL — ABNORMAL LOW (ref 150–400)
RBC: 3.39 MIL/uL — ABNORMAL LOW (ref 4.22–5.81)
RDW: 15.4 % (ref 11.5–15.5)
WBC: 8.3 K/uL (ref 4.0–10.5)
nRBC: 0 % (ref 0.0–0.2)

## 2024-03-08 LAB — GLUCOSE, CAPILLARY
Glucose-Capillary: 133 mg/dL — ABNORMAL HIGH (ref 70–99)
Glucose-Capillary: 67 mg/dL — ABNORMAL LOW (ref 70–99)
Glucose-Capillary: 80 mg/dL (ref 70–99)
Glucose-Capillary: 125 mg/dL — ABNORMAL HIGH (ref 70–99)

## 2024-03-08 LAB — I-STAT CHEM 8, ED
BUN: 69 mg/dL — ABNORMAL HIGH (ref 8–23)
Calcium, Ion: 1 mmol/L — ABNORMAL LOW (ref 1.15–1.40)
Chloride: 99 mmol/L (ref 98–111)
Creatinine, Ser: 8.4 mg/dL — ABNORMAL HIGH (ref 0.61–1.24)
Glucose, Bld: 123 mg/dL — ABNORMAL HIGH (ref 70–99)
HCT: 32 % — ABNORMAL LOW (ref 39.0–52.0)
Hemoglobin: 10.9 g/dL — ABNORMAL LOW (ref 13.0–17.0)
Potassium: 7.8 mmol/L (ref 3.5–5.1)
Sodium: 132 mmol/L — ABNORMAL LOW (ref 135–145)
TCO2: 26 mmol/L (ref 22–32)

## 2024-03-08 LAB — MAGNESIUM: Magnesium: 2.6 mg/dL — ABNORMAL HIGH (ref 1.7–2.4)

## 2024-03-08 LAB — HEPATITIS B SURFACE ANTIGEN: Hepatitis B Surface Ag: NONREACTIVE

## 2024-03-08 LAB — CBG MONITORING, ED
Glucose-Capillary: 185 mg/dL — ABNORMAL HIGH (ref 70–99)
Glucose-Capillary: 177 mg/dL — ABNORMAL HIGH (ref 70–99)

## 2024-03-08 LAB — I-STAT CG4 LACTIC ACID, ED: Lactic Acid, Venous: 4.1 mmol/L (ref 0.5–1.9)

## 2024-03-08 LAB — MRSA NEXT GEN BY PCR, NASAL: MRSA by PCR Next Gen: NOT DETECTED

## 2024-03-08 LAB — TROPONIN I (HIGH SENSITIVITY): Troponin I (High Sensitivity): 35 ng/L — ABNORMAL HIGH (ref <18)

## 2024-03-08 LAB — BRAIN NATRIURETIC PEPTIDE: B Natriuretic Peptide: 1227.1 pg/mL — ABNORMAL HIGH (ref 0.0–100.0)

## 2024-03-08 MED ORDER — POLYETHYLENE GLYCOL 3350 17 G PO PACK: 17.0000 g | PACK | Freq: Every day | ORAL | Status: DC | PRN

## 2024-03-08 MED ORDER — CHLORHEXIDINE GLUCONATE CLOTH 2 % EX PADS: 6.0000 | MEDICATED_PAD | Freq: Every day | CUTANEOUS | Status: DC

## 2024-03-08 MED ORDER — SODIUM ZIRCONIUM CYCLOSILICATE 10 G PO PACK
10.0000 g | PACK | ORAL | Status: AC
  Administered 2024-03-08: 10 g via ORAL
  Filled 2024-03-08: qty 1

## 2024-03-08 MED ORDER — LEVOTHYROXINE SODIUM 88 MCG PO TABS
88.0000 ug | ORAL_TABLET | Freq: Every day | ORAL | Status: DC
  Administered 2024-03-09 – 2024-03-12 (×4): 88 ug via ORAL
  Filled 2024-03-08 (×4): qty 1

## 2024-03-08 MED ORDER — CALCIUM GLUCONATE-NACL 1-0.675 GM/50ML-% IV SOLN
1.0000 g | Freq: Once | INTRAVENOUS | Status: AC
  Administered 2024-03-08: 1000 mg via INTRAVENOUS
  Filled 2024-03-08: qty 50

## 2024-03-08 MED ORDER — DEXTROSE 50 % IV SOLN
1.0000 | Freq: Once | INTRAVENOUS | Status: AC
  Administered 2024-03-08: 50 mL via INTRAVENOUS

## 2024-03-08 MED ORDER — INSULIN ASPART 100 UNIT/ML IJ SOLN
5.0000 [IU] | Freq: Once | INTRAMUSCULAR | Status: AC
  Administered 2024-03-08: 5 [IU] via INTRAVENOUS

## 2024-03-08 MED ORDER — HEPARIN SODIUM (PORCINE) 1000 UNIT/ML DIALYSIS: 1000.0000 [IU] | INTRAMUSCULAR | Status: DC | PRN

## 2024-03-08 MED ORDER — ATROPINE SULFATE 1 MG/10ML IJ SOSY
1.0000 mg | PREFILLED_SYRINGE | Freq: Once | INTRAMUSCULAR | Status: DC
  Administered 2024-03-08: 1 mg via INTRAVENOUS

## 2024-03-08 MED ORDER — LIDOCAINE HCL (PF) 1 % IJ SOLN: 5.0000 mL | INTRAMUSCULAR | Status: DC | PRN

## 2024-03-08 MED ORDER — APIXABAN 5 MG PO TABS
5.0000 mg | ORAL_TABLET | Freq: Two times a day (BID) | ORAL | Status: DC
Start: 2024-03-08 — End: 2024-03-12
  Administered 2024-03-08 – 2024-03-11 (×7): 5 mg via ORAL
  Filled 2024-03-08 (×8): qty 1

## 2024-03-08 MED ORDER — MAGNESIUM SULFATE 2 GM/50ML IV SOLN
2.0000 g | Freq: Once | INTRAVENOUS | Status: AC
  Administered 2024-03-08: 2 g via INTRAVENOUS

## 2024-03-08 MED ORDER — DOCUSATE SODIUM 100 MG PO CAPS: 100.0000 mg | ORAL_CAPSULE | Freq: Two times a day (BID) | ORAL | Status: DC | PRN

## 2024-03-08 MED ORDER — ALBUTEROL SULFATE (2.5 MG/3ML) 0.083% IN NEBU
10.0000 mg | INHALATION_SOLUTION | Freq: Once | RESPIRATORY_TRACT | Status: AC
  Administered 2024-03-08: 10 mg via RESPIRATORY_TRACT

## 2024-03-08 MED ORDER — ATROPINE SULFATE 1 MG/10ML IJ SOSY: 1.0000 mg | PREFILLED_SYRINGE | INTRAMUSCULAR | Status: DC | PRN

## 2024-03-08 MED ORDER — ALTEPLASE 2 MG IJ SOLR: 2.0000 mg | Freq: Once | INTRAMUSCULAR | Status: DC | PRN

## 2024-03-08 MED ORDER — HEPARIN SODIUM (PORCINE) 5000 UNIT/ML IJ SOLN
5000.0000 [IU] | Freq: Three times a day (TID) | INTRAMUSCULAR | Status: DC
  Administered 2024-03-08: 5000 [IU] via SUBCUTANEOUS
  Filled 2024-03-08: qty 1

## 2024-03-08 MED ORDER — LACTATED RINGERS IV BOLUS
500.0000 mL | Freq: Once | INTRAVENOUS | Status: AC
  Administered 2024-03-08: 500 mL via INTRAVENOUS

## 2024-03-08 MED ORDER — SEVELAMER CARBONATE 800 MG PO TABS
1600.0000 mg | ORAL_TABLET | Freq: Three times a day (TID) | ORAL | Status: DC
  Administered 2024-03-08 – 2024-03-11 (×9): 1600 mg via ORAL
  Filled 2024-03-08 (×9): qty 2

## 2024-03-08 MED ORDER — SODIUM BICARBONATE 8.4 % IV SOLN
50.0000 meq | Freq: Once | INTRAVENOUS | Status: AC
  Administered 2024-03-08: 50 meq via INTRAVENOUS
  Filled 2024-03-08: qty 50

## 2024-03-08 MED ORDER — CALCIUM GLUCONATE-NACL 1-0.675 GM/50ML-% IV SOLN
1.0000 g | Freq: Once | INTRAVENOUS | Status: AC
  Administered 2024-03-08: 1000 mg via INTRAVENOUS

## 2024-03-08 MED ORDER — PENTAFLUOROPROP-TETRAFLUOROETH EX AERO: 1.0000 | INHALATION_SPRAY | CUTANEOUS | Status: DC | PRN

## 2024-03-08 MED ORDER — LIDOCAINE-PRILOCAINE 2.5-2.5 % EX CREA: 1.0000 | TOPICAL_CREAM | CUTANEOUS | Status: DC | PRN

## 2024-03-08 NOTE — ED Provider Notes (Signed)
 [place] [facility]MENT AT [facility] Provider Note   CSN: [id number] Arrival date & time: 03/08/24  1206     Patient presents with: No chief complaint on file.   Ryan Wells is a 68 y.o. male.   68 year old male presents with EMS for concern of weakness.  Missed his dialysis session today due to the weakness.  He is ESRD on Monday Wednesday Friday and dialysis schedule.  Last session was Friday.  He has history of A-fib as well.  Denies any chest pain.  History limited due to the acuity of condition. On arrival he has a brady arrhythmia.  He has been attached to ZOLL monitor.  The history is provided by the patient. A language interpreter was used.       Prior to Admission medications   Medication Sig Start Date End Date Taking? Authorizing Provider  acetaminophen  (TYLENOL ) 325 MG tablet Take 2 tablets (650 mg total) by mouth every 6 (six) hours as needed for mild pain (or Fever >/= 101). 04/07/21   Raenelle Coria, MD  apixaban  (ELIQUIS ) 5 MG TABS tablet Take 1 tablet (5 mg total) by mouth 2 (two) times daily. 02/23/24 03/24/24  Christobal Guadalajara, MD  atorvastatin  (LIPITOR) 20 MG tablet TAKE 1 TABLET BY MOUTH DAILY WITH THE EVENING MEAL Patient taking differently: Take 20 mg by mouth at bedtime. 05/23/23   Adella Norris, MD  levothyroxine  (SYNTHROID ) 88 MCG tablet TAKE 1 TABLET(88 MCG) BY MOUTH DAILY Patient taking differently: Take 88 mcg by mouth daily before breakfast. 05/23/23   Adella Norris, MD  pentafluoroprop-tetrafluoroeth JUANA) AERO Apply 1 application topically as needed (topical anesthesia for hemodialysis). Patient not taking: Reported on 02/20/2024 01/30/21   Katsadouros, Vasilios, MD  sevelamer  carbonate (RENVELA ) 800 MG tablet Take 1,600 mg by mouth 3 (three) times daily. 01/09/24   [provider]    Allergies: Patient has no known allergies.    Review of Systems  Unable to perform ROS: Acuity of condition   Constitutional:  Negative for fever.  Respiratory:  Negative for shortness of breath.   Cardiovascular:  Negative for chest pain.  Neurological:  Positive for weakness.    Updated Vital Signs BP 113/70   Pulse 94   Temp 97.6 F (36.4 C) (Oral)   Resp (!) 22   Ht 5' 2 (1.575 m)   Wt 73.9 kg   SpO2 99%   BMI 29.81 kg/m   Physical Exam Vitals and nursing note reviewed.  Constitutional:      General: He is not in acute distress.    Appearance: Normal appearance. He is not diaphoretic.     Comments: No acute distress but he does appear clammy  HENT:     Head: Normocephalic and atraumatic.     Nose: Nose normal.  Eyes:     Conjunctiva/sclera: Conjunctivae normal.  Cardiovascular:     Rate and Rhythm: Bradycardia present. Rhythm irregular.  Pulmonary:     Effort: Pulmonary effort is normal. No respiratory distress.     Breath sounds: Normal breath sounds. No wheezing.  Abdominal:     General: There is no distension.     Tenderness: There is no abdominal tenderness.  Musculoskeletal:        General: No deformity. Normal range of motion.     Right lower leg: No edema.     Left lower leg: No edema.  Skin:    Findings: No rash.  Neurological:     Mental Status: He is  alert.     (all labs ordered are listed, but only abnormal results are displayed) Labs Reviewed  I-STAT CHEM 8, ED - Abnormal; Notable for the following components:      Result Value   Sodium 132 (*)    Potassium 7.8 (*)    BUN 69 (*)    Creatinine, Ser 8.40 (*)    Glucose, Bld 123 (*)    Calcium , Ion 1.00 (*)    Hemoglobin 10.9 (*)    HCT 32.0 (*)    All other components within normal limits  I-STAT CG4 LACTIC ACID, ED - Abnormal; Notable for the following components:   Lactic Acid, Venous 4.1 (*)    All other components within normal limits  CBG MONITORING, ED - Abnormal; Notable for the following components:   Glucose-Capillary 185 (*)    All other components within normal limits  MRSA NEXT  GEN BY PCR, NASAL  CBC WITH DIFFERENTIAL/PLATELET  COMPREHENSIVE METABOLIC PANEL WITH GFR  MAGNESIUM   BRAIN NATRIURETIC PEPTIDE  HEPATITIS B SURFACE ANTIGEN  HEPATITIS B SURFACE ANTIBODY, QUANTITATIVE  POTASSIUM  CBG MONITORING, ED  TROPONIN I (HIGH SENSITIVITY)    EKG: None  Radiology: DG Chest Portable 1 View Result Date: 03/08/2024 CLINICAL DATA:  Near syncope. EXAM: PORTABLE CHEST 1 VIEW COMPARISON:  February 20, 2024 FINDINGS: The cardiac silhouette is enlarged and unchanged in size. Low lung volumes are noted with multiple overlying cardiac lead wires and defibrillator leads. No acute infiltrate, pleural effusion or pneumothorax is identified. Postoperative changes are present within the lower cervical spine. No acute osseous abnormalities are seen. IMPRESSION: Low lung volumes without acute or active cardiopulmonary disease. Electronically Signed   By: Suzen Dials M.D.   On: 03/08/2024 13:35     .Critical Care  Performed by: Hildegard Loge, PA-C Authorized by: Hildegard Loge, PA-C   Critical care provider statement:    Critical care time (minutes):  75   Critical care was necessary to treat or prevent imminent or life-threatening deterioration of the following conditions:  Metabolic crisis   Critical care was time spent personally by me on the following activities:  Development of treatment plan with patient or surrogate, discussions with consultants, evaluation of patient's response to treatment, examination of patient, ordering and review of laboratory studies, ordering and review of radiographic studies, ordering and performing treatments and interventions, pulse oximetry, re-evaluation of patient's condition and review of old charts   Care discussed with: admitting provider      Medications Ordered in the ED  Chlorhexidine  Gluconate Cloth 2 % PADS 6 each (has no administration in time range)  calcium  gluconate 1 g/ 50 mL sodium chloride  IVPB (1,000 mg Intravenous New Bag/Given  03/08/24 1312)  docusate sodium  (COLACE) capsule 100 mg (has no administration in time range)  polyethylene glycol (MIRALAX  / GLYCOLAX ) packet 17 g (has no administration in time range)  heparin  injection 5,000 Units (has no administration in time range)  sodium bicarbonate  injection 50 mEq (has no administration in time range)  calcium  gluconate 1 g/ 50 mL sodium chloride  IVPB (0 mg Intravenous Stopped 03/08/24 1313)  magnesium  sulfate IVPB 2 g 50 mL (2 g Intravenous New Bag/Given 03/08/24 1232)  albuterol  (PROVENTIL ) (2.5 MG/3ML) 0.083% nebulizer solution 10 mg (10 mg Nebulization Given 03/08/24 1248)  insulin  aspart (novoLOG ) injection 5 Units (5 Units Intravenous Given 03/08/24 1249)  dextrose  50 % solution 50 mL (50 mLs Intravenous Given 03/08/24 1245)  lactated ringers  bolus 500 mL (0 mLs Intravenous Stopped 03/08/24  1313)    Clinical Course as of 03/08/24 1340  Mon Mar 08, 2024  [id number] 68 year old ESRD patient who is on MWF schedule for dialysis presents today for concern of weakness and unable to go to dialysis.  I was called into the room because patient was found to be bradycardic and symptomatic.SABRA  Heart rates of 50s.  He does have history of A-fib.  Will give dose of atropine , calcium  gluconate, magnesium  given he missed his dialysis session.  No peaked T waves on EKG.  He states that he was able to make it to his dialysis session on Friday.  Patient has been placed on zoll. [AA]    Clinical Course User Index [AA] Hildegard Loge, PA-C                                 Medical Decision Making Amount and/or Complexity of Data Reviewed Labs: ordered. Radiology: ordered. ECG/medicine tests: ordered.  Risk Prescription drug management. Decision regarding hospitalization.   Medical Decision Making / ED Course   This patient presents to the ED for concern of weakness, bradycardia, this involves an extensive number of treatment options, and is a complaint that carries with it a high risk of  complications and morbidity.  The differential diagnosis includes hyperkalemia, primary bradycardia arrhythmia, volume overload, dehydration, other electrolyte derangement  MDM: 68 year old male presents today for concern of weakness.  He is an ESRD patient.  Missed his dialysis session today due to the weakness.  Last session was Friday.  He is on a Monday Wednesday and Friday schedule. Arrives with rates of 50s and with what appears to be a heart block.  I-STAT CHEM 8 reveals a potassium of 7.8.  Initially atropine , calcium  gluconate, magnesium  were ordered. After potassium resulted other temporizing measures were ordered. Nephrology was emergently consulted.  They state that they will need patient to go to the ICU and they will place orders to have patient dialyzed. ICU will evaluate patient. Additional labs ordered.  Chest x-ray ordered.  No signs of volume overload.  Patient evaluated by attending as well.  Lab Tests: -I ordered, reviewed, and interpreted labs.   The pertinent results include:   Labs Reviewed  I-STAT CHEM 8, ED - Abnormal; Notable for the following components:      Result Value   Sodium 132 (*)    Potassium 7.8 (*)    BUN 69 (*)    Creatinine, Ser 8.40 (*)    Glucose, Bld 123 (*)    Calcium , Ion 1.00 (*)    Hemoglobin 10.9 (*)    HCT 32.0 (*)    All other components within normal limits  I-STAT CG4 LACTIC ACID, ED - Abnormal; Notable for the following components:   Lactic Acid, Venous 4.1 (*)    All other components within normal limits  CBG MONITORING, ED - Abnormal; Notable for the following components:   Glucose-Capillary 185 (*)    All other components within normal limits  MRSA NEXT GEN BY PCR, NASAL  CBC WITH DIFFERENTIAL/PLATELET  COMPREHENSIVE METABOLIC PANEL WITH GFR  MAGNESIUM   BRAIN NATRIURETIC PEPTIDE  HEPATITIS B SURFACE ANTIGEN  HEPATITIS B SURFACE ANTIBODY, QUANTITATIVE  POTASSIUM  CBG MONITORING, ED  TROPONIN I (HIGH SENSITIVITY)       EKG  EKG Interpretation Date/Time:    Ventricular Rate:    PR Interval:    QRS Duration:    QT Interval:  QTC Calculation:   R Axis:      Text Interpretation:           Imaging Studies ordered: I ordered imaging studies including chest x-ray I independently visualized and interpreted imaging. I agree with the radiologist interpretation   Medicines ordered and prescription drug management: Meds ordered this encounter  Medications   DISCONTD: atropine  1 MG/10ML injection 1 mg   calcium  gluconate 1 g/ 50 mL sodium chloride  IVPB   magnesium  sulfate IVPB 2 g 50 mL   DISCONTD: atropine  1 MG/10ML injection 1 mg   albuterol  (PROVENTIL ) (2.5 MG/3ML) 0.083% nebulizer solution 10 mg   insulin  aspart (novoLOG ) injection 5 Units   dextrose  50 % solution 50 mL   lactated ringers  bolus 500 mL   Chlorhexidine  Gluconate Cloth 2 % PADS 6 each   calcium  gluconate 1 g/ 50 mL sodium chloride  IVPB   docusate sodium  (COLACE) capsule 100 mg   polyethylene glycol (MIRALAX  / GLYCOLAX ) packet 17 g   heparin  injection 5,000 Units   sodium bicarbonate  injection 50 mEq    -I have reviewed the patients home medicines and have made adjustments as needed  Critical interventions Atropine , calcium  gluconate, magnesium  Nephrology consult Intensivist consult  Consultations Obtained: I requested consultation with the nephrology Dr. 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  Did not get outpatient stress testing following  hospitalization.  No chest pain since   Chronic kidney disease    on dialysis Tues, Thurs and Sat   Diabetes mellitus without complication (HCC)    Diabetes type 2, uncontrolled 11/25/2011   no meds, diet controlled per patient   Diabetic gastroparesis (HCC) 01/22/2017   Hypertension    no meds   Microalbuminuria 01/19/2017   Myocardial infarction [facility]) 2015      Dispostion: Patient admitted to ICU.  Final diagnoses:  ESRD (end stage renal disease) (HCC)  Hyperkalemia  Bradycardia    ED Discharge Orders     None          Hildegard Loge, PA-C 03/08/24 1353    Neysa Caron PARAS, DO 03/08/24 1640

## 2024-03-08 NOTE — ED Triage Notes (Addendum)
 Pt to er via ems, per ems pt is mwf dialysis pt, states that he did go to dialysis on Friday, states that today he was too weak to go to dialysis and called 911, per ems pt appears to be in afib with very frequent pvc.  States that they started an iv and gave zofran  for vomiting and some saline.  Pt is spanish speaking, computer interpreter used.  Pt states that he is here for vomiting.

## 2024-03-08 NOTE — H&P (Signed)
 NAME:  Ryan Wells, MRN:  [id number], DOB:  08/16/1955, LOS: 0 ADMISSION DATE:  03/08/2024, CONSULTATION DATE:  03/08/2024  REFERRING MD:  Hildegard Farber , CHIEF COMPLAINT: Generalized weakness  History of Present Illness:  68 year old Spanish-speaking man with ESRD on HD MWF brought in by EMS to the ED for generalized weakness and transient lower extremity weakness to the point where he could not walk and did not go for dialysis today.  His last dialysis was on Friday.  He denies fevers or back pain ED vitals were significant for bradycardia , initial EKG showed sinus block .  Rhythm then converted to narrow complex tachycardia on the monitor 120s, irregular and during her interview converted to sinus rhythm at 80s Confirmed on repeat EKG He denies chest pain, shortness of breath, states that his leg weakness is improved Labs significant for potassium of 7.8 hyponatremia, no leukocytosis, stable anemia and mild thrombocytopenia.  Renal was consulted who wanted him to be in the ICU for dialysis session hence PCCM called History was obtained using Spanish interpreter He is scheduled for AV shunt intervention on 8/14 with vascular. I note hospital admission from 7/11 to 7/14 for narrow complex tachycardia, attributed to atypical a flutter.  He developed bradycardia and hypotension due to Cardizem .  Initial EF was 35 to 40% on echo but repeat echo showed EF improved to 50%, seen by cardiology.There was a plan for PET stress test as outpatient   Pertinent  Medical History  ESRD on HD Atypical atrial flutter, on apixaban  Hypothyroidism Diabetes diet controlled Chronic thrombocytopenia MSSA bacteremia due to psoas abscess 2022, transient paraparesis  Significant Hospital Events: Including procedures, antibiotic start and stop dates in addition to other pertinent events     Interim History / Subjective:  Denies chest pain, dyspnea or back pain  Objective    Blood pressure 113/70, pulse  94, temperature 97.6 F (36.4 C), temperature source Oral, resp. rate (!) 22, height 5' 2 (1.575 m), weight 73.9 kg, SpO2 99%.       No intake or output data in the 24 hours ending 03/08/24 1411 Filed Weights   03/08/24 1213  Weight: 73.9 kg    Examination: Gen. Pleasant, obese, in no distress, normal affect ENT - no pallor,icterus, no post nasal drip, class 2 airway, missing front teeth Neck: No JVD, no thyromegaly, no carotid bruits Lungs: no use of accessory muscles, no dullness to percussion, decreased without rales or rhonchi  Cardiovascular: Rhythm regular, heart sounds  normal, no murmurs or gallops, no peripheral edema Abdomen: soft and non-tender, no hepatosplenomegaly, BS normal. Musculoskeletal: No deformities, no cyanosis or clubbing , left arm AV fistula with thrill , mild spinal tenderness lower back Neuro:  alert, non focal, no tremors   Resolved problem list   Assessment and Plan   Unclear cause of generalized weakness that made him miss dialysis this morning.  He describes transient leg weakness and due to prior history of psoas abscess with MSSA bacteremia, we will proceed with imaging his lumbar spine, he has minimal spinal tenderness in this area. His rhythm in the ED on telemetry is varied from atrial fibrillation with block to narrow complex tachycardia to sinus rhythm now.  He likely has conduction defects, some degree of sick sinus syndrome Not sure if hyperkalemia plays a role Will admit to ICU for dialysis session as recommended by renal  Hyperkalemia -received cocktail with calcium , insulin  D50, will give bicarb and admit to ICU for urgent dialysis, renal  aware  Atypical atrial flutter/SVT -telemetry Continue apixaban  Avoid rate slowing agents     Best Practice (right click and Reselect all SmartList Selections daily)   Diet/type: Regular consistency (see orders) DVT prophylaxis DOAC Pressure ulcer(s): N/A GI prophylaxis: N/A Lines:  N/A Foley:  N/A Code Status:  full code Last date of multidisciplinary goals of care discussion [NA]  Labs   CBC: Recent Labs  Lab 03/08/24 1221 03/08/24 1234  WBC 8.3  --   NEUTROABS 5.5  --   HGB 10.7* 10.9*  HCT 34.0* 32.0*  MCV 100.3*  --   PLT 139*  --     Basic Metabolic Panel: Recent Labs  Lab 03/08/24 1234  NA 132*  K 7.8*  CL 99  GLUCOSE 123*  BUN 69*  CREATININE 8.40*   GFR: Estimated Creatinine Clearance: 7.3 mL/min (A) (by C-G formula based on SCr of 8.4 mg/dL (H)). Recent Labs  Lab 03/08/24 1221 03/08/24 1234  WBC 8.3  --   LATICACIDVEN  --  4.1*    Liver Function Tests: No results for input(s): AST, ALT, ALKPHOS, BILITOT, PROT, ALBUMIN  in the last 168 hours. No results for input(s): LIPASE, AMYLASE in the last 168 hours. No results for input(s): AMMONIA in the last 168 hours.  ABG    Component Value Date/Time   HCO3 17.4 (L) 01/16/2021 0459   TCO2 26 03/08/2024 1234   ACIDBASEDEF 8.1 (H) 01/16/2021 0459   O2SAT 52.5 01/16/2021 0459     Coagulation Profile: No results for input(s): INR, PROTIME in the last 168 hours.  Cardiac Enzymes: No results for input(s): CKTOTAL, CKMB, CKMBINDEX, TROPONINI in the last 168 hours.  HbA1C: Hgb A1c MFr Bld  Date/Time Value Ref Range Status  09/30/2023 09:21 AM 6.0 (H) 4.8 - 5.6 % Final    Comment:             Prediabetes: 5.7 - 6.4          Diabetes: >6.4          Glycemic control for adults with diabetes: <7.0   04/17/2023 09:08 AM 6.1 (H) 4.8 - 5.6 % Final    Comment:             Prediabetes: 5.7 - 6.4          Diabetes: >6.4          Glycemic control for adults with diabetes: <7.0     CBG: Recent Labs  Lab 03/08/24 1300  GLUCAP 185*    Review of Systems:   Constitutional: negative for anorexia, fevers and sweats  Eyes: negative for irritation, redness and visual disturbance  Ears, nose, mouth, throat, and face: negative for earaches, epistaxis, nasal  congestion and sore throat  Respiratory: negative for cough, dyspnea on exertion, sputum and wheezing  Cardiovascular: negative for chest pain, dyspnea, lower extremity edema, orthopnea, palpitations and syncope  Gastrointestinal: negative for abdominal pain, constipation, diarrhea, melena, nausea and vomiting  Genitourinary:negative for dysuria, frequency and hematuria  Hematologic/lymphatic: negative for bleeding, easy bruising and lymphadenopathy  Musculoskeletal:negative for arthralgias, muscle weakness and stiff joints  Neurological:  + coordination problems, gait problems, neg for  headaches and weakness  Endocrine: negative for diabetic symptoms including polydipsia, polyuria and weight loss   Past Medical History:  He,  has a past medical history of 3rd nerve palsy, partial, right (01/03/2017), AAA (abdominal aortic aneurysm) (HCC), Chest pain (11/2013), Chronic kidney disease, Diabetes mellitus without complication (HCC), Diabetes type 2, uncontrolled (11/25/2011), Diabetic gastroparesis (  HCC) (01/22/2017), Hypertension, Microalbuminuria (01/19/2017), and Myocardial infarction (HCC) (2015).   Surgical History:   Past Surgical History:  Procedure Laterality Date   A/V FISTULAGRAM Left 06/04/2019   Procedure: A/V FISTULAGRAM;  Surgeon: Eliza Lonni RAMAN, MD;  Location: [facility] INVASIVE CV LAB;  Service: Cardiovascular;  Laterality: Left;   A/V FISTULAGRAM Left 10/15/2019   Procedure: A/V FISTULAGRAM;  Surgeon: Eliza Lonni RAMAN, MD;  Location: [facility] INVASIVE CV LAB;  Service: Cardiovascular;  Laterality: Left;   A/V FISTULAGRAM N/A 01/24/2021   Procedure: A/V FISTULAGRAM - Left Upper;  Surgeon: Magda Debby SAILOR, MD;  Location: [facility] INVASIVE CV LAB;  Service: Cardiovascular;  Laterality: N/A;   AV FISTULA PLACEMENT Left 07/02/2018   Procedure: BRACHIOCEPHALIC ARTERIOVENOUS (AV) FISTULA CREATION LEFT ARM;  Surgeon: Serene Gaile ORN, MD;  Location: [facility] OR;  Service: Vascular;  Laterality: Left;    BASCILIC VEIN TRANSPOSITION Left 07/26/2019   Procedure: Bascilic Vein Transposition;  Surgeon: Eliza Lonni RAMAN, MD;  Location: [facility] OR;  Service: Vascular;  Laterality: Left;   COLONOSCOPY     EMBOLIZATION (CATH LAB) Left 06/04/2019   Procedure: EMBOLIZATION;  Surgeon: Eliza Lonni RAMAN, MD;  Location: [facility] INVASIVE CV LAB;  Service: Cardiovascular;  Laterality: Left;  LT ARM FISTULA/COMPETING BRANCH   EYE SURGERY Bilateral    cataracts x2   EYE SURGERY     INSERTION OF DIALYSIS CATHETER Right 06/08/2019   Procedure: INSERTION OF PALINDROME DIALYSIS CATHETER IN RIGHT INTERNAL JUGULAR;  Surgeon: Eliza Lonni RAMAN, MD;  Location: [facility] OR;  Service: Vascular;  Laterality: Right;   LIGATION OF ARTERIOVENOUS  FISTULA Left 07/26/2019   Procedure: Ligation Of BrachioCephalic  Fistula;  Surgeon: Eliza Lonni RAMAN, MD;  Location: [facility] OR;  Service: Vascular;  Laterality: Left;   NECK SURGERY     PERIPHERAL VASCULAR BALLOON ANGIOPLASTY Left 06/04/2019   Procedure: PERIPHERAL VASCULAR BALLOON ANGIOPLASTY;  Surgeon: Eliza Lonni RAMAN, MD;  Location: [facility] INVASIVE CV LAB;  Service: Cardiovascular;  Laterality: Left;  ARM FISTULA   PERIPHERAL VASCULAR BALLOON ANGIOPLASTY Left 10/15/2019   Procedure: PERIPHERAL VASCULAR BALLOON ANGIOPLASTY;  Surgeon: Eliza Lonni RAMAN, MD;  Location: [facility] INVASIVE CV LAB;  Service: Cardiovascular;  Laterality: Left;  arm fistula   TEE WITHOUT CARDIOVERSION N/A 01/19/2021   Procedure: TRANSESOPHAGEAL ECHOCARDIOGRAM (TEE);  Surgeon: Jeffrie Oneil BROCKS, MD;  Location: [facility] ENDOSCOPY;  Service: Cardiovascular;  Laterality: N/A;     Social History:   reports that he has quit smoking. He has never used smokeless tobacco. He reports that he does not currently use alcohol. He reports that he does not use drugs.   Family History:  His family history includes Diabetes in his brother, father, and sister; Heart disease in his mother; Kidney disease in his father. There  is no history of Colon cancer, Stomach cancer, or Esophageal cancer.   Allergies No Known Allergies   Home Medications  Prior to Admission medications   Medication Sig Start Date End Date Taking? Authorizing Provider  acetaminophen  (TYLENOL ) 325 MG tablet Take 2 tablets (650 mg total) by mouth every 6 (six) hours as needed for mild pain (or Fever >/= 101). 04/07/21   Raenelle Coria, MD  apixaban  (ELIQUIS ) 5 MG TABS tablet Take 1 tablet (5 mg total) by mouth 2 (two) times daily. 02/23/24 03/24/24  Christobal Guadalajara, MD  atorvastatin  (LIPITOR) 20 MG tablet TAKE 1 TABLET BY MOUTH DAILY WITH THE EVENING MEAL Patient taking differently: Take 20 mg by mouth at bedtime. 05/23/23   Adella Norris, MD  levothyroxine  (SYNTHROID ) 88 MCG tablet TAKE 1 TABLET(88 MCG) BY MOUTH DAILY Patient taking differently: Take 88 mcg by mouth daily before breakfast. 05/23/23   Adella Norris, MD  pentafluoroprop-tetrafluoroeth JUANA) AERO Apply 1 application topically as needed (topical anesthesia for hemodialysis). Patient not taking: Reported on 02/20/2024 01/30/21   Katsadouros, Vasilios, MD  sevelamer  carbonate (RENVELA ) 800 MG tablet Take 1,600 mg by mouth 3 (three) times daily. 01/09/24   [provider]    My independent critical care time was 40 minutes  Harden Staff MD. FCCP. [facility] Pager : [phone]  If no response to pager , please call [phone] until 7 pm After 7:00 pm call Elink  [phone]   03/08/2024

## 2024-03-08 NOTE — Consult Note (Signed)
 [facility] Renal Consultation Note    Indication for Consultation:  Management of ESRD/hemodialysis; anemia, hypertension/volume and secondary hyperparathyroidism  ERE:Floazmmb, Almarie, MD  HPI: Ryan Wells is a 68 y.o. male with ESRD on HD MWF at [facility]. He has a past medical history significant for HTN, T2DM, and MSSA bacteremia. Patient was brought into the ED today via EMS c/o generalized weakness.  Seen and examined patient on the ICU unit. Bedside RN assisted with interpretation. Noted patient's last HD was on 03/05/24. Currently, patient is c/o numbness and itchiness within his lower legs. Patient also reports numbness, cramping, and CP after getting HD outpatient. Appears he met his EDW last HD in outpatient. He's on RA and denies SOB, CP, and N/V. BP now is 145/65 and he has a functional L AVF. Notable labs include K+ 7.8, BNP 1227, and lactic acid 4.1. Noted Calcium , Insulin , D50, and Bicarb were already given. Plan for urgent dialysis later today. I ordered Lokelma  X 1 and will repeat another K+ level. If still > 6.5, will re-dose Ca.  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  Did not get outpatient stress testing following hospitalization.  No chest pain since   Chronic kidney disease    on dialysis Tues, Thurs and Sat   Diabetes mellitus without complication (HCC)    Diabetes type 2, uncontrolled 11/25/2011   no meds, diet controlled per patient   Diabetic gastroparesis (HCC) 01/22/2017   Hypertension    no meds   Microalbuminuria 01/19/2017   Myocardial infarction [facility]) 2015   Past Surgical History:  Procedure Laterality Date   A/V FISTULAGRAM Left 06/04/2019   Procedure: A/V FISTULAGRAM;  Surgeon: Eliza Lonni RAMAN, MD;  Location: [facility] INVASIVE CV LAB;   Service: Cardiovascular;  Laterality: Left;   A/V FISTULAGRAM Left 10/15/2019   Procedure: A/V FISTULAGRAM;  Surgeon: Eliza Lonni RAMAN, MD;  Location: [facility] INVASIVE CV LAB;  Service: Cardiovascular;  Laterality: Left;   A/V FISTULAGRAM N/A 01/24/2021   Procedure: A/V FISTULAGRAM - Left Upper;  Surgeon: Magda Debby SAILOR, MD;  Location: [facility] INVASIVE CV LAB;  Service: Cardiovascular;  Laterality: N/A;   AV FISTULA PLACEMENT Left 07/02/2018   Procedure: BRACHIOCEPHALIC ARTERIOVENOUS (AV) FISTULA CREATION LEFT ARM;  Surgeon: Serene Gaile ORN, MD;  Location: [facility] OR;  Service: Vascular;  Laterality: Left;   BASCILIC VEIN TRANSPOSITION Left 07/26/2019   Procedure: Bascilic Vein Transposition;  Surgeon: Eliza Lonni RAMAN, MD;  Location: [facility] OR;  Service: Vascular;  Laterality: Left;   COLONOSCOPY     EMBOLIZATION (CATH LAB) Left 06/04/2019   Procedure: EMBOLIZATION;  Surgeon: Eliza Lonni RAMAN, MD;  Location: [facility] INVASIVE CV LAB;  Service: Cardiovascular;  Laterality: Left;  LT ARM FISTULA/COMPETING BRANCH   EYE SURGERY Bilateral    cataracts x2   EYE SURGERY     INSERTION OF DIALYSIS CATHETER Right 06/08/2019   Procedure: INSERTION OF PALINDROME DIALYSIS CATHETER IN RIGHT INTERNAL JUGULAR;  Surgeon: Eliza Lonni RAMAN, MD;  Location: [facility] OR;  Service: Vascular;  Laterality: Right;   LIGATION OF ARTERIOVENOUS  FISTULA Left 07/26/2019   Procedure: Ligation Of BrachioCephalic  Fistula;  Surgeon: Eliza Lonni RAMAN, MD;  Location: [facility] OR;  Service: Vascular;  Laterality: Left;   NECK SURGERY     PERIPHERAL VASCULAR BALLOON ANGIOPLASTY  Left 06/04/2019   Procedure: PERIPHERAL VASCULAR BALLOON ANGIOPLASTY;  Surgeon: Eliza Lonni RAMAN, MD;  Location: [facility] INVASIVE CV LAB;  Service: Cardiovascular;  Laterality: Left;  ARM FISTULA   PERIPHERAL VASCULAR BALLOON ANGIOPLASTY Left 10/15/2019   Procedure: PERIPHERAL VASCULAR BALLOON ANGIOPLASTY;  Surgeon: Eliza Lonni RAMAN, MD;  Location: [facility]  INVASIVE CV LAB;  Service: Cardiovascular;  Laterality: Left;  arm fistula   TEE WITHOUT CARDIOVERSION N/A 01/19/2021   Procedure: TRANSESOPHAGEAL ECHOCARDIOGRAM (TEE);  Surgeon: Jeffrie Oneil BROCKS, MD;  Location: [facility] ENDOSCOPY;  Service: Cardiovascular;  Laterality: N/A;   Family History  Problem Relation Age of Onset   Heart disease Mother        cause of death--CHF?   Diabetes Father    Kidney disease Father        Dialysis for kidney failure   Diabetes Sister    Diabetes Brother    Colon cancer Neg Hx    Stomach cancer Neg Hx    Esophageal cancer Neg Hx    Social History:  reports that he has quit smoking. He has never used smokeless tobacco. He reports that he does not currently use alcohol. He reports that he does not use drugs. No Known Allergies Prior to Admission medications   Medication Sig Start Date End Date Taking? Authorizing Provider  acetaminophen  (TYLENOL ) 325 MG tablet Take 2 tablets (650 mg total) by mouth every 6 (six) hours as needed for mild pain (or Fever >/= 101). 04/07/21  Yes Raenelle Coria, MD  apixaban  (ELIQUIS ) 5 MG TABS tablet Take 1 tablet (5 mg total) by mouth 2 (two) times daily. 02/23/24 03/24/24 Yes Kc, Mennie, MD  atorvastatin  (LIPITOR) 20 MG tablet TAKE 1 TABLET BY MOUTH DAILY WITH THE EVENING MEAL Patient taking differently: Take 20 mg by mouth at bedtime. 05/23/23  Yes Adella Norris, MD  B Complex-C-Folic Acid  (DIALYVITE 800) 0.8 MG TABS Take 1 tablet by mouth at bedtime.   Yes [provider]  levothyroxine  (SYNTHROID ) 88 MCG tablet TAKE 1 TABLET(88 MCG) BY MOUTH DAILY Patient taking differently: Take 88 mcg by mouth at bedtime. 05/23/23  Yes Adella Norris, MD  sevelamer  carbonate (RENVELA ) 800 MG tablet Take 1,600 mg by mouth with breakfast, with lunch, and with evening meal. 01/09/24  Yes [provider]   Current Facility-Administered Medications  Medication Dose Route Frequency Provider Last Rate Last Admin   apixaban   (ELIQUIS ) tablet 5 mg  5 mg Oral BID Alva, Rakesh V, MD       [START ON 03/09/2024] Chlorhexidine  Gluconate Cloth 2 % PADS 6 each  6 each Topical Q0600 Macel Jayson PARAS, MD       docusate sodium  (COLACE) capsule 100 mg  100 mg Oral BID PRN Bowser, Grace E, NP       [START ON 03/09/2024] levothyroxine  (SYNTHROID ) tablet 88 mcg  88 mcg Oral Q0600 Alva, Rakesh V, MD       polyethylene glycol (MIRALAX  / GLYCOLAX ) packet 17 g  17 g Oral Daily PRN Bowser, Grace E, NP       sevelamer  carbonate (RENVELA ) tablet 1,600 mg  1,600 mg Oral TID with meals Jude Harden GAILS, MD       sodium zirconium cyclosilicate  (LOKELMA ) packet 10 g  10 g Oral STAT Lenon Charmaine BRAVO, NP       Labs: Basic Metabolic Panel: Recent Labs  Lab 03/08/24 1221 03/08/24 1234  NA 135 132*  K >7.5* 7.8*  CL 94* 99  CO2 24  --  GLUCOSE 125* 123*  BUN 65* 69*  CREATININE 8.52* 8.40*  CALCIUM  9.1  --    Liver Function Tests: Recent Labs  Lab 03/08/24 1221  AST 27  ALT 26  ALKPHOS 137*  BILITOT 1.3*  PROT 7.4  ALBUMIN  4.0   No results for input(s): LIPASE, AMYLASE in the last 168 hours. No results for input(s): AMMONIA in the last 168 hours. CBC: Recent Labs  Lab 03/08/24 1221 03/08/24 1234  WBC 8.3  --   NEUTROABS 5.5  --   HGB 10.7* 10.9*  HCT 34.0* 32.0*  MCV 100.3*  --   PLT 139*  --    Cardiac Enzymes: No results for input(s): CKTOTAL, CKMB, CKMBINDEX, TROPONINI in the last 168 hours. CBG: Recent Labs  Lab 03/08/24 1300 03/08/24 1402 03/08/24 1456  GLUCAP 185* 177* 133*   Iron  Studies: No results for input(s): IRON , TIBC, TRANSFERRIN, FERRITIN in the last 72 hours. Studies/Results: CT LUMBAR SPINE WO CONTRAST Result Date: 03/08/2024 CLINICAL DATA:  Lower back pain, cauda equina syndrome suspected. Transient bilateral lower extremity weakness. EXAM: CT LUMBAR SPINE WITHOUT CONTRAST TECHNIQUE: Multidetector CT imaging of the lumbar spine was performed without intravenous  contrast administration. Multiplanar CT image reconstructions were also generated. RADIATION DOSE REDUCTION: This exam was performed according to the departmental dose-optimization program which includes automated exposure control, adjustment of the mA and/or kV according to patient size and/or use of iterative reconstruction technique. COMPARISON:  CT pelvis 03/17/2021, MRI lumbar spine 03/16/2021. FINDINGS: Segmentation: 5 lumbar type vertebrae. Alignment: Lumbar lordosis is maintained.  No listhesis. Vertebrae: There is irregularity and sclerosis of the L5 inferior endplate and S1 superior endplate which is progressed since the 2022 CT. There is increased irregularity of the L5 inferior endplate with areas of fragmentation and erosive change. There is vacuum disc phenomena noted within the adjacent disc space. Vertebral body heights are otherwise maintained. No additional osseous lesion. Degenerative changes and ankylosis of the sacroiliac joints. Paraspinal and other soft tissues: The paraspinal musculature is unremarkable. Mild scattered atherosclerosis of the abdominal aorta and branch vessels. Atrophic appearance of the bilateral native kidneys. There is likely cholelithiasis. Ascites noted in the pelvis with additional small in fluid more superiorly within the abdomen. Disc levels: T12-L1: No significant disc bulge. No significant spinal canal stenosis or foraminal stenosis. L1-2: Small disc bulge. Mild facet arthrosis. No significant spinal canal stenosis. Mild foraminal stenosis on the right L2-3: Vacuum disc phenomenon. Diffuse disc bulge. Bilateral facet arthrosis slightly greater on the right. No significant spinal canal stenosis. Mild foraminal stenosis on the right. L3-4: Diffuse disc bulge. Bilateral facet arthrosis and thickening of the ligamentum flavum. Mild spinal canal stenosis. Mild bilateral foraminal stenosis slightly greater on the left. L4-5: Diffuse disc bulge. Severe facet arthrosis.  Thickening of the ligamentum flavum. Mild spinal canal stenosis. There is moderate bilateral foraminal stenosis. L5-S1: Endplate erosive changes as above. Small disc bulge and posterior osteophytes. Severe facet arthrosis. There is erosive change of the bilateral facets more pronounced on the left. Mild spinal canal stenosis. Moderate to severe right and moderate left foraminal stenosis. IMPRESSION: Interval progression of irregularity and sclerosis of the L5-S1 endplates. Findings likely reflect sequelae of discitis/osteomyelitis. Recommend correlation with MRI lumbar spine with and without contrast for further evaluation. Degenerative changes as above. Foraminal stenosis most pronounced on the right at L5-S1. Electronically Signed   By: Donnice Mania M.D.   On: 03/08/2024 15:01   DG Chest Portable 1 View Result Date: 03/08/2024 CLINICAL DATA:  Near syncope. EXAM: PORTABLE CHEST 1 VIEW COMPARISON:  February 20, 2024 FINDINGS: The cardiac silhouette is enlarged and unchanged in size. Low lung volumes are noted with multiple overlying cardiac lead wires and defibrillator leads. No acute infiltrate, pleural effusion or pneumothorax is identified. Postoperative changes are present within the lower cervical spine. No acute osseous abnormalities are seen. IMPRESSION: Low lung volumes without acute or active cardiopulmonary disease. Electronically Signed   By: Suzen Dials M.D.   On: 03/08/2024 13:35    ROS: All others negative except those listed in HPI.  Physical Exam: Vitals:   03/08/24 1215 03/08/24 1245 03/08/24 1435 03/08/24 1500  BP: 104/64 113/70 139/67   Pulse: (!) 52 94 80   Resp: 20 (!) 22 17   Temp: 97.6 F (36.4 C)   (!) 97.5 F (36.4 C)  TempSrc: Oral   Oral  SpO2: 100% 99% 100%   Weight:      Height:         General: Awake, alert, on RA, NAD Head: Sclera not icteric  Lungs: CTA bilaterally. No wheeze, rales or rhonchi. Breathing is unlabored. Heart: RRR. No murmur, rubs or gallops.   Abdomen: soft and non-tender M/S:  Equal strength b/l in upper and lower extremities.  Lower extremities: No LE edema Neuro: AAOx3. Moves all extremities spontaneously. Dialysis Access: L AVF (+) B/T  Dialysis Orders:  MWF - [facility] 3hrs31min, BFR 400, DFR  Auto 1.5,  EDW 66.7kg, 2K/2Ca Mircera 30 mcg - last given on 02/27/24; Dose raised to 75mcg Q4 wks Calcitriol  2mcg PO qHD - last dose 03/05/24  Last Labs: Hgb 10.9, K 7.8, Ca 9.1, Alb 4.0  Assessment/Plan: Generalized weakness - per Primary ESRD - on HD MWF. Plan for urgent HD later today-to be done at bedside on ICU. Hyperkalemia - K+ now 7.8. Calcium , Insulin , D50, and bicarb given. Lokelma  X 1 ordered. Plan to re-check K+ level. Ordered 1K X 1 hour then 2K but this may be adjusted depending on repeat K+ result.  Pauses on EKG - none noted since arrival to ICU. Monitor closely.  Hypertension/volume  - Current BP acceptable. Euvolemic on exam Anemia of CKD - Hgb 10.9. No ESA/Fe indicated at this time Secondary Hyperparathyroidism -  Check phos in AM. Continue Calcitriol  Nutrition - Renal diet with fluid restriction  Charmaine Piety, NP [facility] 03/08/2024, 3:37 PM

## 2024-03-08 NOTE — Plan of Care (Signed)

## 2024-03-09 ENCOUNTER — Inpatient Hospital Stay (HOSPITAL_COMMUNITY)

## 2024-03-09 ENCOUNTER — Ambulatory Visit: Admitting: Cardiology

## 2024-03-09 DIAGNOSIS — E875 Hyperkalemia: Secondary | ICD-10-CM

## 2024-03-09 DIAGNOSIS — I484 Atypical atrial flutter: Secondary | ICD-10-CM

## 2024-03-09 DIAGNOSIS — R531 Weakness: Secondary | ICD-10-CM

## 2024-03-09 DIAGNOSIS — N186 End stage renal disease: Secondary | ICD-10-CM

## 2024-03-09 DIAGNOSIS — Z992 Dependence on renal dialysis: Secondary | ICD-10-CM | POA: Diagnosis not present

## 2024-03-09 LAB — BASIC METABOLIC PANEL WITH GFR
Anion gap: 14 (ref 5–15)
BUN: 27 mg/dL — ABNORMAL HIGH (ref 8–23)
CO2: 28 mmol/L (ref 22–32)
Calcium: 9 mg/dL (ref 8.9–10.3)
Chloride: 93 mmol/L — ABNORMAL LOW (ref 98–111)
Creatinine, Ser: 4.48 mg/dL — ABNORMAL HIGH (ref 0.61–1.24)
GFR, Estimated: 13 mL/min — ABNORMAL LOW (ref >60)
Glucose, Bld: 154 mg/dL — ABNORMAL HIGH (ref 70–99)
Potassium: 4.5 mmol/L (ref 3.5–5.1)
Sodium: 135 mmol/L (ref 135–145)

## 2024-03-09 LAB — HEPATITIS B SURFACE ANTIBODY, QUANTITATIVE: Hep B S AB Quant (Post): 101 m[IU]/mL

## 2024-03-09 LAB — CBC
HCT: 26.8 % — ABNORMAL LOW (ref 39.0–52.0)
Hemoglobin: 8.7 g/dL — ABNORMAL LOW (ref 13.0–17.0)
MCH: 31.8 pg (ref 26.0–34.0)
MCHC: 32.5 g/dL (ref 30.0–36.0)
MCV: 97.8 fL (ref 80.0–100.0)
Platelets: 109 K/uL — ABNORMAL LOW (ref 150–400)
RBC: 2.74 MIL/uL — ABNORMAL LOW (ref 4.22–5.81)
RDW: 15.3 % (ref 11.5–15.5)
WBC: 6.7 K/uL (ref 4.0–10.5)
nRBC: 0 % (ref 0.0–0.2)

## 2024-03-09 LAB — GLUCOSE, CAPILLARY
Glucose-Capillary: 173 mg/dL — ABNORMAL HIGH (ref 70–99)
Glucose-Capillary: 94 mg/dL (ref 70–99)
Glucose-Capillary: 102 mg/dL — ABNORMAL HIGH (ref 70–99)
Glucose-Capillary: 129 mg/dL — ABNORMAL HIGH (ref 70–99)
Glucose-Capillary: 106 mg/dL — ABNORMAL HIGH (ref 70–99)
Glucose-Capillary: 116 mg/dL — ABNORMAL HIGH (ref 70–99)

## 2024-03-09 MED ORDER — CHLORHEXIDINE GLUCONATE CLOTH 2 % EX PADS
6.0000 | MEDICATED_PAD | Freq: Every day | CUTANEOUS | Status: DC
  Administered 2024-03-08: 6 via TOPICAL

## 2024-03-09 MED ORDER — GADOBUTROL 1 MMOL/ML IV SOLN
8.0000 mL | Freq: Once | INTRAVENOUS | Status: AC | PRN
  Administered 2024-03-09: 8 mL via INTRAVENOUS

## 2024-03-09 MED ORDER — INSULIN ASPART 100 UNIT/ML IJ SOLN: 0.0000 [IU] | Freq: Three times a day (TID) | INTRAMUSCULAR | Status: DC

## 2024-03-09 MED ORDER — INSULIN ASPART 100 UNIT/ML IJ SOLN: 0.0000 [IU] | Freq: Every day | INTRAMUSCULAR | Status: DC

## 2024-03-09 NOTE — Progress Notes (Addendum)
 NAME:  Ryan Wells, MRN:  [id number], DOB:  05-20-1956, LOS: 1 ADMISSION DATE:  03/08/2024, CONSULTATION DATE:  03/09/2024  REFERRING MD:  Hildegard Farber , CHIEF COMPLAINT: Generalized weakness  History of Present Illness:  68 year old Spanish-speaking man with ESRD on HD MWF brought in by EMS to the ED for generalized weakness and transient lower extremity weakness to the point where he could not walk and did not go for dialysis today.  His last dialysis was on Friday.  He denies fevers or back pain ED vitals were significant for bradycardia , initial EKG showed sinus block .  Rhythm then converted to narrow complex tachycardia on the monitor 120s, irregular and during her interview converted to sinus rhythm at 80s Confirmed on repeat EKG He denies chest pain, shortness of breath, states that his leg weakness is improved Labs significant for potassium of 7.8 hyponatremia, no leukocytosis, stable anemia and mild thrombocytopenia.  Renal was consulted who wanted him to be in the ICU for dialysis session hence PCCM called History was obtained using Spanish interpreter He is scheduled for AV shunt intervention on 8/14 with vascular. I note hospital admission from 7/11 to 7/14 for narrow complex tachycardia, attributed to atypical a flutter.  He developed bradycardia and hypotension due to Cardizem .  Initial EF was 35 to 40% on echo but repeat echo showed EF improved to 50%, seen by cardiology.There was a plan for PET stress test as outpatient   Pertinent  Medical History  ESRD on HD Atypical atrial flutter, on apixaban  Hypothyroidism Diabetes diet controlled Chronic thrombocytopenia MSSA bacteremia due to psoas abscess 2022, transient paraparesis  Significant Hospital Events: Including procedures, antibiotic start and stop dates in addition to other pertinent events   7/28-had dialysis, tolerated well  Interim History / Subjective:  Denies any pain or discomfort Uneventful  night  Objective    Blood pressure 123/61, pulse (!) 58, temperature 98.3 F (36.8 C), temperature source Oral, resp. rate 13, height 5' 2 (1.575 m), weight 72.8 kg, SpO2 97%.        Intake/Output Summary (Last 24 hours) at 03/09/2024 0801 Last data filed at 03/09/2024 0600 Gross per 24 hour  Intake 1037.68 ml  Output 1.5 ml  Net 1036.18 ml   Filed Weights   03/08/24 1213 03/09/24 0336  Weight: 73.9 kg 72.8 kg    Examination: Gen. Elderly, does not appear to be in distress moist oral mucosa ENT -moist oral mucosa Neck: No JVD, no adenopathy Lungs: Clear breath sounds Cardiovascular: S1-S2 appreciated Abdomen: Soft, bowel sounds appreciated Musculoskeletal: No clubbing, no edema Neuro:  alert, non focal, no tremors   Resolved problem list   Assessment and Plan   Chronic kidney disease - Tolerated dialysis well - Electrolyte derangement, hyperkalemia-resolved  Weakness Lumbar spine CT scan with evidence of sequelae from discitis, degenerative changes.  Foraminal stenosis most pronounced L5-S1 - MRI recommended for further evaluation - Will ambulate patient today  Hyperkalemia - Resolved  Atypical atrial flutters/SVT - Continue apixaban   Can transfer out of unit, PT ordered  Best Practice (right click and Reselect all SmartList Selections daily)   Diet/type: Regular consistency (see orders) DVT prophylaxis DOAC Pressure ulcer(s): N/A GI prophylaxis: N/A Lines: N/A Foley:  N/A Code Status:  full code Last date of multidisciplinary goals of care discussion [NA]  Labs   CBC: Recent Labs  Lab 03/08/24 1221 03/08/24 1234 03/09/24 0244  WBC 8.3  --  6.7  NEUTROABS 5.5  --   --  HGB 10.7* 10.9* 8.7*  HCT 34.0* 32.0* 26.8*  MCV 100.3*  --  97.8  PLT 139*  --  109*    Basic Metabolic Panel: Recent Labs  Lab 03/08/24 1221 03/08/24 1234 03/08/24 1559 03/09/24 0244  NA 135 132* 135 135  K >7.5* 7.8* 6.5* 4.5  CL 94* 99 96* 93*  CO2 24  --   22 28  GLUCOSE 125* 123* 117* 154*  BUN 65* 69* 65* 27*  CREATININE 8.52* 8.40* 8.40* 4.48*  CALCIUM  9.1  --  9.5 9.0  MG 2.6*  --   --   --    GFR: Estimated Creatinine Clearance: 13.6 mL/min (A) (by C-G formula based on SCr of 4.48 mg/dL (H)). Recent Labs  Lab 03/08/24 1221 03/08/24 1234 03/09/24 0244  WBC 8.3  --  6.7  LATICACIDVEN  --  4.1*  --     Liver Function Tests: Recent Labs  Lab 03/08/24 1221  AST 27  ALT 26  ALKPHOS 137*  BILITOT 1.3*  PROT 7.4  ALBUMIN  4.0   No results for input(s): LIPASE, AMYLASE in the last 168 hours. No results for input(s): AMMONIA in the last 168 hours.  ABG    Component Value Date/Time   HCO3 17.4 (L) 01/16/2021 0459   TCO2 26 03/08/2024 1234   ACIDBASEDEF 8.1 (H) 01/16/2021 0459   O2SAT 52.5 01/16/2021 0459     Coagulation Profile: No results for input(s): INR, PROTIME in the last 168 hours.  Cardiac Enzymes: No results for input(s): CKTOTAL, CKMB, CKMBINDEX, TROPONINI in the last 168 hours.  HbA1C: Hgb A1c MFr Bld  Date/Time Value Ref Range Status  09/30/2023 09:21 AM 6.0 (H) 4.8 - 5.6 % Final    Comment:             Prediabetes: 5.7 - 6.4          Diabetes: >6.4          Glycemic control for adults with diabetes: <7.0   04/17/2023 09:08 AM 6.1 (H) 4.8 - 5.6 % Final    Comment:             Prediabetes: 5.7 - 6.4          Diabetes: >6.4          Glycemic control for adults with diabetes: <7.0     CBG: Recent Labs  Lab 03/08/24 1936 03/08/24 2028 03/08/24 2321 03/09/24 0325 03/09/24 0724  GLUCAP 67* 80 125* 173* 94    Review of Systems:   Constitutional: negative for anorexia, fevers and sweats  Eyes: negative for irritation, redness and visual disturbance  Ears, nose, mouth, throat, and face: negative for earaches, epistaxis, nasal congestion and sore throat  Respiratory: negative for cough, dyspnea on exertion, sputum and wheezing  Cardiovascular: negative for chest pain, dyspnea,  lower extremity edema, orthopnea, palpitations and syncope  Gastrointestinal: negative for abdominal pain, constipation, diarrhea, melena, nausea and vomiting  Genitourinary:negative for dysuria, frequency and hematuria  Hematologic/lymphatic: negative for bleeding, easy bruising and lymphadenopathy  Musculoskeletal:negative for arthralgias, muscle weakness and stiff joints  Neurological:  + coordination problems, gait problems, neg for  headaches and weakness  Endocrine: negative for diabetic symptoms including polydipsia, polyuria and weight loss   Past Medical History:  He,  has a past medical history of 3rd nerve palsy, partial, right (01/03/2017), AAA (abdominal aortic aneurysm) (HCC), Chest pain (11/2013), Chronic kidney disease, Diabetes mellitus without complication (HCC), Diabetes type 2, uncontrolled (11/25/2011), Diabetic gastroparesis (HCC) (01/22/2017),  Hypertension, Microalbuminuria (01/19/2017), and Myocardial infarction (HCC) (2015).   Surgical History:   Past Surgical History:  Procedure Laterality Date   A/V FISTULAGRAM Left 06/04/2019   Procedure: A/V FISTULAGRAM;  Surgeon: Eliza Lonni RAMAN, MD;  Location: [facility] INVASIVE CV LAB;  Service: Cardiovascular;  Laterality: Left;   A/V FISTULAGRAM Left 10/15/2019   Procedure: A/V FISTULAGRAM;  Surgeon: Eliza Lonni RAMAN, MD;  Location: [facility] INVASIVE CV LAB;  Service: Cardiovascular;  Laterality: Left;   A/V FISTULAGRAM N/A 01/24/2021   Procedure: A/V FISTULAGRAM - Left Upper;  Surgeon: Magda Debby SAILOR, MD;  Location: [facility] INVASIVE CV LAB;  Service: Cardiovascular;  Laterality: N/A;   AV FISTULA PLACEMENT Left 07/02/2018   Procedure: BRACHIOCEPHALIC ARTERIOVENOUS (AV) FISTULA CREATION LEFT ARM;  Surgeon: Serene Gaile ORN, MD;  Location: [facility] OR;  Service: Vascular;  Laterality: Left;   BASCILIC VEIN TRANSPOSITION Left 07/26/2019   Procedure: Bascilic Vein Transposition;  Surgeon: Eliza Lonni RAMAN, MD;  Location: [facility] OR;  Service:  Vascular;  Laterality: Left;   COLONOSCOPY     EMBOLIZATION (CATH LAB) Left 06/04/2019   Procedure: EMBOLIZATION;  Surgeon: Eliza Lonni RAMAN, MD;  Location: [facility] INVASIVE CV LAB;  Service: Cardiovascular;  Laterality: Left;  LT ARM FISTULA/COMPETING BRANCH   EYE SURGERY Bilateral    cataracts x2   EYE SURGERY     INSERTION OF DIALYSIS CATHETER Right 06/08/2019   Procedure: INSERTION OF PALINDROME DIALYSIS CATHETER IN RIGHT INTERNAL JUGULAR;  Surgeon: Eliza Lonni RAMAN, MD;  Location: [facility] OR;  Service: Vascular;  Laterality: Right;   LIGATION OF ARTERIOVENOUS  FISTULA Left 07/26/2019   Procedure: Ligation Of BrachioCephalic  Fistula;  Surgeon: Eliza Lonni RAMAN, MD;  Location: [facility] OR;  Service: Vascular;  Laterality: Left;   NECK SURGERY     PERIPHERAL VASCULAR BALLOON ANGIOPLASTY Left 06/04/2019   Procedure: PERIPHERAL VASCULAR BALLOON ANGIOPLASTY;  Surgeon: Eliza Lonni RAMAN, MD;  Location: [facility] INVASIVE CV LAB;  Service: Cardiovascular;  Laterality: Left;  ARM FISTULA   PERIPHERAL VASCULAR BALLOON ANGIOPLASTY Left 10/15/2019   Procedure: PERIPHERAL VASCULAR BALLOON ANGIOPLASTY;  Surgeon: Eliza Lonni RAMAN, MD;  Location: [facility] INVASIVE CV LAB;  Service: Cardiovascular;  Laterality: Left;  arm fistula   TEE WITHOUT CARDIOVERSION N/A 01/19/2021   Procedure: TRANSESOPHAGEAL ECHOCARDIOGRAM (TEE);  Surgeon: Jeffrie Oneil BROCKS, MD;  Location: [facility] ENDOSCOPY;  Service: Cardiovascular;  Laterality: N/A;     Social History:   reports that he has quit smoking. He has never used smokeless tobacco. He reports that he does not currently use alcohol. He reports that he does not use drugs.   Family History:  His family history includes Diabetes in his brother, father, and sister; Heart disease in his mother; Kidney disease in his father. There is no history of Colon cancer, Stomach cancer, or Esophageal cancer.   Allergies No Known Allergies   The patient is critically ill with multiple organ  systems failure and requires high complexity decision making for assessment and support, frequent evaluation and titration of therapies, application of advanced monitoring technologies and extensive interpretation of multiple databases. Critical Care Time devoted to patient care services described in this note independent of APP/resident time (if applicable)  is 32 minutes.   Jennet Epley MD [facility] Personal pager: See Amion If unanswered, please page CCM On-call: #[phone]

## 2024-03-09 NOTE — Hospital Course (Addendum)
 Patient is a Hispanic male with past medical history significant for ESRD on hemodialysis Monday Wednesday Friday presented the hospital for generalized weakness and transient lower extremity weakness to the point where he could not walk and did not go for dialysis.  Last dialysis session was Friday on 7/25.  Initial labs showed hyperkalemia of a potassium of 7.8.  Nephrology was consulted and recommended the patient be admitted to the ICU for dialysis session so PCCM was consulted and he was admitted to the intensive care unit and transferred to TRH service on 03/09/2024. Awaiting MRI and PT/OT Evaluation. Patient to undergo HD again tomorrow.   Assessment and Plan:  Generalized Weakness and Physical Deconditioning: He had a lumbar spine CT with evidence of sequela from discitis and degenerative changes with foraminal stenosis that was most pronounced at L5-S1 ordering an MRI for further evaluation and are going to try and ambulate the patient today.  Will need PT OT to further evaluate and treat: Continue to trend daily labs check a phosphorus level as well; the Nephrology team feels that this was associated with his hyperkalemia and think this is improved.  Hyperkalemia: Improving.   ESRD on HD MWF Metabolic Bone Disease Elevated AG Metabolic Acidosis  -K+ went from  Recent Labs  Lab 02/21/24 2032 02/22/24 0233 02/23/24 0311 03/08/24 1221 03/08/24 1234 03/08/24 1559 03/09/24 0244  K 4.8 5.6* 5.8* >7.5* 7.8* 6.5* 4.5  -Received a cocktail with calcium , insulin  D50 given bicarbonate and admitted to the ICU for urgent dialysis.  Nephrology consulted and current BUN/Cr Trend this visit went from 65/8.52 -> 69/8.40 -> 65/8.40 -> 27/4.48. Avoid Nephrotoxic Medications, Contrast Dyes, Hypotension and Dehydration to Ensure Adequate Renal Perfusion and will need to Renally Adjust Meds -Continue Sevelamer  Carbonate 1600 mg p.o. 3 times daily with meals -Further care per Nephrology. CTM and Trend Renal  Function carefully and repeat CMP in the AM and they are planning on dialyzing the patient again on 03/10/2024  Hypothyroidism: Check TSH in the a.m. and continuing Levothyroxine  88 mcg daily; THS was elevate at 9.7 with Free T4 being last admission  Narrow Complex Tachycardia Atypical Atrial Flutter/SVT: Recent Symptomatic Bradycardia -Continuing apixaban  and monitoring on telemetry unit; Cardiology was following last admit; Not on BB  Chronic Systolic Dysfunction: CAD History: -EF is low but in the setting of tachycardia, appears to be improved now on repeat last ECHO. Statin held. -BNP is now 1227.1 -Noted previous plan for PET stress test as outpatient which patient did not follow-up -Volume Maintenance per HD  Diabetes Mellitus Type 2 w/ Hyperlipidemia: Diet Controlled DM. Add Very Sensitive Novolog  SSI AC/HS. CTM CBGs per Protocol. Current CBG Trend:  Recent Labs  Lab 03/08/24 1936 03/08/24 2028 03/08/24 2321 03/09/24 0325 03/09/24 0724 03/09/24 1135 03/09/24 1537  GLUCAP 67* 80 125* 173* 94 102* 129*   Anemia of Chronic Disease (ESRD): Hgb/Hct went from  Recent Labs  Lab 02/20/24 1340 02/21/24 0818 02/22/24 0233 02/23/24 0311 03/08/24 1221 03/08/24 1234 03/09/24 0244  HGB 10.0* 9.9* 10.0* 9.9* 10.7* 10.9* 8.7*  HCT 30.5* 30.4* 31.4* 30.4* 34.0* 32.0* 26.8*  MCV 95.6 95.0 96.9 95.0 100.3*  --  97.8  -Check Anemia Panel in the AM. CTM for S/Sx of Bleeding; No overt bleeding noted. Repeat CBC in the AM  Thrombocytopenia: Chronic; Plt Count ranging from 109-139 this visit. CTM for S/Sx of Bleeding no overt bleeding noted. Repeat CBC in the AM  Overweight: Complicates overall prognosis and care. Estimated body mass index  is 29.35 kg/m as calculated from the following:   Height as of this encounter: 5' 2 (1.575 m).   Weight as of this encounter: 72.8 kg. Weight Loss and Dietary Counseling given

## 2024-03-09 NOTE — Progress Notes (Signed)
 PROGRESS NOTE    Ryan Wells  FMW:[id number] DOB: 07/29/1956 DOA: 03/08/2024 PCP: Adella Norris, MD   Brief Narrative:  Patient is a Hispanic male with past medical history significant for ESRD on hemodialysis Monday Wednesday Friday presented the hospital for generalized weakness and transient lower extremity weakness to the point where he could not walk and did not go for dialysis.  Last dialysis session was Friday on 7/25.  Initial labs showed hyperkalemia of a potassium of 7.8.  Nephrology was consulted and recommended the patient be admitted to the ICU for dialysis session so PCCM was consulted and he was admitted to the intensive care unit and transferred to TRH service on 03/09/2024. Awaiting MRI and PT/OT Evaluation. Patient to undergo HD again tomorrow.   Assessment and Plan:  Generalized Weakness and Physical Deconditioning: He had a lumbar spine CT with evidence of sequela from discitis and degenerative changes with foraminal stenosis that was most pronounced at L5-S1 ordering an MRI for further evaluation and are going to try and ambulate the patient today.  Will need PT OT to further evaluate and treat: Continue to trend daily labs check a phosphorus level as well; the Nephrology team feels that this was associated with his hyperkalemia and think this is improved.  Hyperkalemia: Improving.   ESRD on HD MWF Metabolic Bone Disease Elevated AG Metabolic Acidosis  -K+ went from  Recent Labs  Lab 02/21/24 2032 02/22/24 0233 02/23/24 0311 03/08/24 1221 03/08/24 1234 03/08/24 1559 03/09/24 0244  K 4.8 5.6* 5.8* >7.5* 7.8* 6.5* 4.5  -Received a cocktail with calcium , insulin  D50 given bicarbonate and admitted to the ICU for urgent dialysis.  Nephrology consulted and current BUN/Cr Trend this visit went from 65/8.52 -> 69/8.40 -> 65/8.40 -> 27/4.48. Avoid Nephrotoxic Medications, Contrast Dyes, Hypotension and Dehydration to Ensure Adequate Renal Perfusion and will need  to Renally Adjust Meds -Continue Sevelamer  Carbonate 1600 mg p.o. 3 times daily with meals -Further care per Nephrology. CTM and Trend Renal Function carefully and repeat CMP in the AM and they are planning on dialyzing the patient again on 03/10/2024  Hypothyroidism: Check TSH in the a.m. and continuing Levothyroxine  88 mcg daily; THS was elevate at 9.7 with Free T4 being last admission  Narrow Complex Tachycardia Atypical Atrial Flutter/SVT: Recent Symptomatic Bradycardia -Continuing apixaban  and monitoring on telemetry unit; Cardiology was following last admit; Not on BB  Chronic Systolic Dysfunction: CAD History: -EF is low but in the setting of tachycardia, appears to be improved now on repeat last ECHO. Statin held. -BNP is now 1227.1 -Noted previous plan for PET stress test as outpatient which patient did not follow-up -Volume Maintenance per HD  Diabetes Mellitus Type 2 w/ Hyperlipidemia: Diet Controlled DM. Add Very Sensitive Novolog  SSI AC/HS. CTM CBGs per Protocol. Current CBG Trend:  Recent Labs  Lab 03/08/24 1936 03/08/24 2028 03/08/24 2321 03/09/24 0325 03/09/24 0724 03/09/24 1135 03/09/24 1537  GLUCAP 67* 80 125* 173* 94 102* 129*   Anemia of Chronic Disease (ESRD): Hgb/Hct went from  Recent Labs  Lab 02/20/24 1340 02/21/24 0818 02/22/24 0233 02/23/24 0311 03/08/24 1221 03/08/24 1234 03/09/24 0244  HGB 10.0* 9.9* 10.0* 9.9* 10.7* 10.9* 8.7*  HCT 30.5* 30.4* 31.4* 30.4* 34.0* 32.0* 26.8*  MCV 95.6 95.0 96.9 95.0 100.3*  --  97.8  -Check Anemia Panel in the AM. CTM for S/Sx of Bleeding; No overt bleeding noted. Repeat CBC in the AM  Thrombocytopenia: Chronic; Plt Count ranging from 109-139 this visit. CTM for  S/Sx of Bleeding no overt bleeding noted. Repeat CBC in the AM  Overweight: Complicates overall prognosis and care. Estimated body mass index is 29.35 kg/m as calculated from the following:   Height as of this encounter: 5' 2 (1.575 m).   Weight as  of this encounter: 72.8 kg. Weight Loss and Dietary Counseling given   DVT prophylaxis:  apixaban  (ELIQUIS ) tablet 5 mg    Code Status: Full Code Family Communication: No family present @ bedside   Disposition Plan:  Level of care: Telemetry Cardiac Status is: Inpatient Remains inpatient appropriate because: Needs further evaluation and clearance Nephrology; PT/OT to Evaluate and Treat    Consultants:  PCCM Transfer Nephrology  Procedures:  As delineated as above   Antimicrobials:  Anti-infectives (From admission, onward)    None       Subjective: Seen and examined at bedside and thinks he was doing better. Still complaining of weakness in his legs. Feels a little bit better though. No other concerns or complaints at this time.   Objective: Vitals:   03/09/24 1200 03/09/24 1538 03/09/24 1731 03/09/24 1930  BP: (!) 116/54  (!) 134/51   Pulse: 60  (!) 57   Resp: 15  18   Temp:  98.3 F (36.8 C) (P) 98.6 F (37 C) 98.4 F (36.9 C)  TempSrc:  Oral  Oral  SpO2: 94%  94%   Weight:      Height:        Intake/Output Summary (Last 24 hours) at 03/09/2024 1943 Last data filed at 03/09/2024 1600 Gross per 24 hour  Intake 1080 ml  Output 1.5 ml  Net 1078.5 ml   Filed Weights   03/08/24 1213 03/09/24 0336  Weight: 73.9 kg 72.8 kg   Examination: Physical Exam:  Constitutional: WN/WD overweight chronically ill-appearing Hispanic male in no acute distress Respiratory: Diminished to auscultation bilaterally, no wheezing, rales, rhonchi or crackles. Normal respiratory effort and patient is not tachypenic. No accessory muscle use.  Unlabored breathing Cardiovascular: RRR, no murmurs / rubs / gallops. S1 and S2 auscultated.  Trace extremity edema Abdomen: Soft, non-tender, distended secondary body habitus. Bowel sounds positive.  GU: Deferred. Musculoskeletal: No clubbing / cyanosis of digits/nails. No joint deformity upper and lower extremities. Good ROM, no contractures.  Normal strength and muscle tone.  Skin: No rashes, lesions, ulcers on a limited skin evaluation. No induration; Warm and dry.  Neurologic: CN 2-12 grossly intact with no focal deficits. Romberg sign and cerebellar reflexes not assessed.  Psychiatric: Normal judgment and insight. Awake and alert  Data Reviewed: I have personally reviewed following labs and imaging studies  CBC: Recent Labs  Lab 03/08/24 1221 03/08/24 1234 03/09/24 0244  WBC 8.3  --  6.7  NEUTROABS 5.5  --   --   HGB 10.7* 10.9* 8.7*  HCT 34.0* 32.0* 26.8*  MCV 100.3*  --  97.8  PLT 139*  --  109*   Basic Metabolic Panel: Recent Labs  Lab 03/08/24 1221 03/08/24 1234 03/08/24 1559 03/09/24 0244  NA 135 132* 135 135  K >7.5* 7.8* 6.5* 4.5  CL 94* 99 96* 93*  CO2 24  --  22 28  GLUCOSE 125* 123* 117* 154*  BUN 65* 69* 65* 27*  CREATININE 8.52* 8.40* 8.40* 4.48*  CALCIUM  9.1  --  9.5 9.0  MG 2.6*  --   --   --    GFR: Estimated Creatinine Clearance: 13.6 mL/min (A) (by C-G formula based on SCr of 4.48  mg/dL (H)). Liver Function Tests: Recent Labs  Lab 03/08/24 1221  AST 27  ALT 26  ALKPHOS 137*  BILITOT 1.3*  PROT 7.4  ALBUMIN  4.0   No results for input(s): LIPASE, AMYLASE in the last 168 hours. No results for input(s): AMMONIA in the last 168 hours. Coagulation Profile: No results for input(s): INR, PROTIME in the last 168 hours. Cardiac Enzymes: No results for input(s): CKTOTAL, CKMB, CKMBINDEX, TROPONINI in the last 168 hours. BNP (last 3 results) No results for input(s): PROBNP in the last 8760 hours. HbA1C: No results for input(s): HGBA1C in the last 72 hours. CBG: Recent Labs  Lab 03/08/24 2321 03/09/24 0325 03/09/24 0724 03/09/24 1135 03/09/24 1537  GLUCAP 125* 173* 94 102* 129*   Lipid Profile: No results for input(s): CHOL, HDL, LDLCALC, TRIG, CHOLHDL, LDLDIRECT in the last 72 hours. Thyroid Function Tests: No results for input(s): TSH,  T4TOTAL, FREET4, T3FREE, THYROIDAB in the last 72 hours. Anemia Panel: No results for input(s): VITAMINB12, FOLATE, FERRITIN, TIBC, IRON , RETICCTPCT in the last 72 hours. Sepsis Labs: Recent Labs  Lab 03/08/24 1234  LATICACIDVEN 4.1*   Recent Results (from the past 240 hours)  MRSA Next Gen by PCR, Nasal     Status: None   Collection Time: 03/08/24  2:50 PM   Specimen: Nasal Mucosa; Nasal Swab  Result Value Ref Range Status   MRSA by PCR Next Gen NOT DETECTED NOT DETECTED Final    Comment: (NOTE) The GeneXpert MRSA Assay (FDA approved for NASAL specimens only), is one component of a comprehensive MRSA colonization surveillance program. It is not intended to diagnose MRSA infection nor to guide or monitor treatment for MRSA infections. Test performance is not FDA approved in patients less than 53 years old. Performed at [facility], [address], [place], [place] [address]     Radiology Studies: CT LUMBAR SPINE WO CONTRAST Result Date: 03/08/2024 CLINICAL DATA:  Lower back pain, cauda equina syndrome suspected. Transient bilateral lower extremity weakness. EXAM: CT LUMBAR SPINE WITHOUT CONTRAST TECHNIQUE: Multidetector CT imaging of the lumbar spine was performed without intravenous contrast administration. Multiplanar CT image reconstructions were also generated. RADIATION DOSE REDUCTION: This exam was performed according to the departmental dose-optimization program which includes automated exposure control, adjustment of the mA and/or kV according to patient size and/or use of iterative reconstruction technique. COMPARISON:  CT pelvis 03/17/2021, MRI lumbar spine 03/16/2021. FINDINGS: Segmentation: 5 lumbar type vertebrae. Alignment: Lumbar lordosis is maintained.  No listhesis. Vertebrae: There is irregularity and sclerosis of the L5 inferior endplate and S1 superior endplate which is progressed since the 2022 CT. There is increased irregularity of the L5  inferior endplate with areas of fragmentation and erosive change. There is vacuum disc phenomena noted within the adjacent disc space. Vertebral body heights are otherwise maintained. No additional osseous lesion. Degenerative changes and ankylosis of the sacroiliac joints. Paraspinal and other soft tissues: The paraspinal musculature is unremarkable. Mild scattered atherosclerosis of the abdominal aorta and branch vessels. Atrophic appearance of the bilateral native kidneys. There is likely cholelithiasis. Ascites noted in the pelvis with additional small in fluid more superiorly within the abdomen. Disc levels: T12-L1: No significant disc bulge. No significant spinal canal stenosis or foraminal stenosis. L1-2: Small disc bulge. Mild facet arthrosis. No significant spinal canal stenosis. Mild foraminal stenosis on the right L2-3: Vacuum disc phenomenon. Diffuse disc bulge. Bilateral facet arthrosis slightly greater on the right. No significant spinal canal stenosis. Mild foraminal stenosis on the  right. L3-4: Diffuse disc bulge. Bilateral facet arthrosis and thickening of the ligamentum flavum. Mild spinal canal stenosis. Mild bilateral foraminal stenosis slightly greater on the left. L4-5: Diffuse disc bulge. Severe facet arthrosis. Thickening of the ligamentum flavum. Mild spinal canal stenosis. There is moderate bilateral foraminal stenosis. L5-S1: Endplate erosive changes as above. Small disc bulge and posterior osteophytes. Severe facet arthrosis. There is erosive change of the bilateral facets more pronounced on the left. Mild spinal canal stenosis. Moderate to severe right and moderate left foraminal stenosis. IMPRESSION: Interval progression of irregularity and sclerosis of the L5-S1 endplates. Findings likely reflect sequelae of discitis/osteomyelitis. Recommend correlation with MRI lumbar spine with and without contrast for further evaluation. Degenerative changes as above. Foraminal stenosis most  pronounced on the right at L5-S1. Electronically Signed   By: Donnice Mania M.D.   On: 03/08/2024 15:01   DG Chest Portable 1 View Result Date: 03/08/2024 CLINICAL DATA:  Near syncope. EXAM: PORTABLE CHEST 1 VIEW COMPARISON:  February 20, 2024 FINDINGS: The cardiac silhouette is enlarged and unchanged in size. Low lung volumes are noted with multiple overlying cardiac lead wires and defibrillator leads. No acute infiltrate, pleural effusion or pneumothorax is identified. Postoperative changes are present within the lower cervical spine. No acute osseous abnormalities are seen. IMPRESSION: Low lung volumes without acute or active cardiopulmonary disease. Electronically Signed   By: Suzen Dials M.D.   On: 03/08/2024 13:35   Scheduled Meds:  apixaban   5 mg Oral BID   Chlorhexidine  Gluconate Cloth  6 each Topical Q0600   insulin  aspart  0-5 Units Subcutaneous QHS   [START ON 03/10/2024] insulin  aspart  0-6 Units Subcutaneous TID WC   levothyroxine   88 mcg Oral Q0600   sevelamer  carbonate  1,600 mg Oral TID with meals   Continuous Infusions:   LOS: 1 day   Alejandro Marker, DO [facility] Available via Epic secure chat 7am-7pm After these hours, please refer to coverage provider listed on [email or web address] 03/09/2024, 7:43 PM

## 2024-03-09 NOTE — Progress Notes (Signed)
 Pt. Receives OP HD MWF [facility] clinic, chair time 1210. Will assist as needed.  Brycin Kille Dialysis Navigator [email or web address]

## 2024-03-09 NOTE — Progress Notes (Signed)
 Nephrology Follow-Up Consult note   Assessment/Recommendations: Ryan Wells is a/an 68 y.o. male with a past medical history significant for ESRD, admitted for severe hyperkalemia.      Dialysis Orders:  MWF - [facility] 3hrs68min, BFR 400, DFR  Auto 1.5,  EDW 66.7kg, 2K/2Ca Mircera 30 mcg - last given on 02/27/24; Dose raised to 75mcg Q4 wks Calcitriol  2mcg PO qHD - last dose 03/05/24   Last Labs: Hgb 10.9, K 7.8, Ca 9.1, Alb 4.0   Assessment/Plan: Generalized weakness -associated with hyperkalemia.  Seems to be improved ESRD - on HD MWF.  Received urgent dialysis Monday for severe hyperkalemia.  Plan for dialysis tomorrow Hyperkalemia -potassium in the outpatient setting mostly around 4.5-5.5.  Severe hyperkalemia here.  Unclear what set this off.  Will plan for dialysis again tomorrow. Bradycardia: Associated with hyperkalemia.  Now improved Hypertension/volume  - Current BP acceptable. Euvolemic on exam Anemia of CKD - Hgb 8.7. Unclear why such a drop. CTM for now Secondary Hyperparathyroidism -  Phos 5.8. CTM. Continue Calcitriol  Nutrition - Renal diet with fluid restriction   Recommendations conveyed to primary service.    [facility] 03/09/2024 10:27 AM  ___________________________________________________________  CC: weakness  Interval History/Subjective: Patient received urgent dialysis yesterday with no issues.  Tolerated dialysis well.  Potassium within normal limits this morning  Medications:  Current Facility-Administered Medications  Medication Dose Route Frequency Provider Last Rate Last Admin   alteplase  (CATHFLO ACTIVASE ) injection 2 mg  2 mg Intracatheter Once PRN Player Ryan JINNY, MD       apixaban  (ELIQUIS ) tablet 5 mg  5 mg Oral BID Alva, Rakesh V, MD   5 mg at 03/08/24 2300   Chlorhexidine  Gluconate Cloth 2 % PADS 6 each  6 each Topical Q0600 Jude Harden GAILS, MD   6 each at 03/08/24 1500    docusate sodium  (COLACE) capsule 100 mg  100 mg Oral BID PRN Bowser, Grace E, NP       heparin  injection 1,000 Units  1,000 Units Dialysis PRN Player Ryan JINNY, MD       levothyroxine  (SYNTHROID ) tablet 88 mcg  88 mcg Oral Q0600 Alva, Rakesh V, MD   88 mcg at 03/09/24 0548   lidocaine  (PF) (XYLOCAINE ) 1 % injection 5 mL  5 mL Intradermal PRN Player Ryan JINNY, MD       lidocaine -prilocaine  (EMLA ) cream 1 Application  1 Application Topical PRN Nature Kueker J, MD       pentafluoroprop-tetrafluoroeth (GEBAUERS) aerosol 1 Application  1 Application Topical PRN Kindall Swaby J, MD       polyethylene glycol (MIRALAX  / GLYCOLAX ) packet 17 g  17 g Oral Daily PRN Bowser, Grace E, NP       sevelamer  carbonate (RENVELA ) tablet 1,600 mg  1,600 mg Oral TID with meals Jude Harden GAILS, MD   1,600 mg at 03/09/24 0720      Review of Systems: 10 systems reviewed and negative except per interval history/subjective  Physical Exam: Vitals:   03/09/24 0600 03/09/24 0721  BP: 123/61   Pulse: (!) 58   Resp: 13   Temp:  98.3 F (36.8 C)  SpO2: 97%    No intake/output data recorded.  Intake/Output Summary (Last 24 hours) at 03/09/2024 1027 Last data filed at 03/09/2024 0600 Gross per 24 hour  Intake 1037.68 ml  Output 1.5 ml  Net 1036.18 ml   Constitutional: well-appearing, no acute distress ENMT: ears and nose without  scars or lesions, MMM CV: normal rate, no edema Respiratory: clear to auscultation, normal work of breathing Gastrointestinal: soft, non-tender, no palpable masses or hernias Skin: no visible lesions or rashes Psych: alert, judgement/insight appropriate, appropriate mood and affect   Test Results I personally reviewed new and old clinical labs and radiology tests Lab Results  Component Value Date   NA 135 03/09/2024   K 4.5 03/09/2024   CL 93 (L) 03/09/2024   CO2 28 03/09/2024   BUN 27 (H) 03/09/2024   CREATININE 4.48 (H) 03/09/2024   CALCIUM  9.0 03/09/2024   ALBUMIN  4.0  03/08/2024   PHOS 5.8 (H) 02/20/2024    CBC Recent Labs  Lab 03/08/24 1221 03/08/24 1234 03/09/24 0244  WBC 8.3  --  6.7  NEUTROABS 5.5  --   --   HGB 10.7* 10.9* 8.7*  HCT 34.0* 32.0* 26.8*  MCV 100.3*  --  97.8  PLT 139*  --  109*

## 2024-03-10 ENCOUNTER — Other Ambulatory Visit: Payer: Self-pay

## 2024-03-10 DIAGNOSIS — N186 End stage renal disease: Secondary | ICD-10-CM | POA: Diagnosis not present

## 2024-03-10 DIAGNOSIS — E875 Hyperkalemia: Secondary | ICD-10-CM | POA: Diagnosis not present

## 2024-03-10 DIAGNOSIS — I428 Other cardiomyopathies: Secondary | ICD-10-CM

## 2024-03-10 DIAGNOSIS — R001 Bradycardia, unspecified: Secondary | ICD-10-CM | POA: Diagnosis not present

## 2024-03-10 DIAGNOSIS — I5020 Unspecified systolic (congestive) heart failure: Secondary | ICD-10-CM

## 2024-03-10 DIAGNOSIS — I4892 Unspecified atrial flutter: Secondary | ICD-10-CM | POA: Diagnosis not present

## 2024-03-10 DIAGNOSIS — R000 Tachycardia, unspecified: Secondary | ICD-10-CM

## 2024-03-10 LAB — CBC WITH DIFFERENTIAL/PLATELET
Abs Immature Granulocytes: 0.04 K/uL (ref 0.00–0.07)
Basophils Absolute: 0.1 K/uL (ref 0.0–0.1)
Basophils Relative: 1 %
Eosinophils Absolute: 0 K/uL (ref 0.0–0.5)
Eosinophils Relative: 0 %
HCT: 28.7 % — ABNORMAL LOW (ref 39.0–52.0)
Hemoglobin: 9.1 g/dL — ABNORMAL LOW (ref 13.0–17.0)
Immature Granulocytes: 0 %
Lymphocytes Relative: 15 %
Lymphs Abs: 1.4 K/uL (ref 0.7–4.0)
MCH: 31.7 pg (ref 26.0–34.0)
MCHC: 31.7 g/dL (ref 30.0–36.0)
MCV: 100 fL (ref 80.0–100.0)
Monocytes Absolute: 0.9 K/uL (ref 0.1–1.0)
Monocytes Relative: 10 %
Neutro Abs: 6.6 K/uL (ref 1.7–7.7)
Neutrophils Relative %: 74 %
Platelets: 120 K/uL — ABNORMAL LOW (ref 150–400)
RBC: 2.87 MIL/uL — ABNORMAL LOW (ref 4.22–5.81)
RDW: 15.3 % (ref 11.5–15.5)
WBC: 9 K/uL (ref 4.0–10.5)
nRBC: 0 % (ref 0.0–0.2)

## 2024-03-10 LAB — BASIC METABOLIC PANEL WITH GFR
Anion gap: 10 (ref 5–15)
BUN: 32 mg/dL — ABNORMAL HIGH (ref 8–23)
CO2: 30 mmol/L (ref 22–32)
Calcium: 8.9 mg/dL (ref 8.9–10.3)
Chloride: 93 mmol/L — ABNORMAL LOW (ref 98–111)
Creatinine, Ser: 4.56 mg/dL — ABNORMAL HIGH (ref 0.61–1.24)
GFR, Estimated: 13 mL/min — ABNORMAL LOW (ref >60)
Glucose, Bld: 174 mg/dL — ABNORMAL HIGH (ref 70–99)
Potassium: 4.9 mmol/L (ref 3.5–5.1)
Sodium: 133 mmol/L — ABNORMAL LOW (ref 135–145)

## 2024-03-10 LAB — PHOSPHORUS: Phosphorus: 5.1 mg/dL — ABNORMAL HIGH (ref 2.5–4.6)

## 2024-03-10 LAB — GLUCOSE, CAPILLARY
Glucose-Capillary: 59 mg/dL — ABNORMAL LOW (ref 70–99)
Glucose-Capillary: 80 mg/dL (ref 70–99)
Glucose-Capillary: 88 mg/dL (ref 70–99)
Glucose-Capillary: 77 mg/dL (ref 70–99)
Glucose-Capillary: 145 mg/dL — ABNORMAL HIGH (ref 70–99)
Glucose-Capillary: 156 mg/dL — ABNORMAL HIGH (ref 70–99)
Glucose-Capillary: 177 mg/dL — ABNORMAL HIGH (ref 70–99)

## 2024-03-10 LAB — MAGNESIUM: Magnesium: 2.4 mg/dL (ref 1.7–2.4)

## 2024-03-10 LAB — COMPREHENSIVE METABOLIC PANEL WITH GFR
ALT: 19 U/L (ref 0–44)
AST: 15 U/L (ref 15–41)
Albumin: 3.6 g/dL (ref 3.5–5.0)
Alkaline Phosphatase: 105 U/L (ref 38–126)
Anion gap: 12 (ref 5–15)
BUN: 56 mg/dL — ABNORMAL HIGH (ref 8–23)
CO2: 26 mmol/L (ref 22–32)
Calcium: 8.7 mg/dL — ABNORMAL LOW (ref 8.9–10.3)
Chloride: 93 mmol/L — ABNORMAL LOW (ref 98–111)
Creatinine, Ser: 6.86 mg/dL — ABNORMAL HIGH (ref 0.61–1.24)
GFR, Estimated: 8 mL/min — ABNORMAL LOW (ref >60)
Glucose, Bld: 114 mg/dL — ABNORMAL HIGH (ref 70–99)
Potassium: 6.3 mmol/L (ref 3.5–5.1)
Sodium: 131 mmol/L — ABNORMAL LOW (ref 135–145)
Total Bilirubin: 1 mg/dL (ref 0.0–1.2)
Total Protein: 6.8 g/dL (ref 6.5–8.1)

## 2024-03-10 LAB — POTASSIUM
Potassium: 5.3 mmol/L — ABNORMAL HIGH (ref 3.5–5.1)
Potassium: 4.1 mmol/L (ref 3.5–5.1)
Potassium: 4.7 mmol/L (ref 3.5–5.1)

## 2024-03-10 MED ORDER — GLUCOSE 40 % PO GEL
ORAL | Status: AC
  Filled 2024-03-10: qty 1.21

## 2024-03-10 MED ORDER — PROCHLORPERAZINE EDISYLATE 10 MG/2ML IJ SOLN
5.0000 mg | Freq: Four times a day (QID) | INTRAMUSCULAR | Status: DC | PRN
  Administered 2024-03-10: 5 mg via INTRAVENOUS
  Filled 2024-03-10 (×2): qty 1

## 2024-03-10 MED ORDER — METOPROLOL TARTRATE 5 MG/5ML IV SOLN
2.5000 mg | Freq: Once | INTRAVENOUS | Status: AC
  Administered 2024-03-10: 2.5 mg via INTRAVENOUS

## 2024-03-10 MED ORDER — ALBUTEROL SULFATE (2.5 MG/3ML) 0.083% IN NEBU
10.0000 mg | INHALATION_SOLUTION | Freq: Once | RESPIRATORY_TRACT | Status: AC
  Administered 2024-03-10: 10 mg via RESPIRATORY_TRACT
  Filled 2024-03-10: qty 12

## 2024-03-10 MED ORDER — SODIUM ZIRCONIUM CYCLOSILICATE 10 G PO PACK
10.0000 g | PACK | Freq: Once | ORAL | Status: AC
  Administered 2024-03-10: 10 g via ORAL
  Filled 2024-03-10: qty 1

## 2024-03-10 MED ORDER — INSULIN ASPART 100 UNIT/ML IV SOLN
5.0000 [IU] | Freq: Once | INTRAVENOUS | Status: AC
  Administered 2024-03-10: 5 [IU] via INTRAVENOUS

## 2024-03-10 MED ORDER — METOPROLOL TARTRATE 5 MG/5ML IV SOLN
INTRAVENOUS | Status: AC
  Filled 2024-03-10: qty 5

## 2024-03-10 MED ORDER — GLUCOSE 40 % PO GEL
1.0000 | ORAL | Status: AC
  Administered 2024-03-10: 31 g via ORAL

## 2024-03-10 MED ORDER — DEXTROSE 50 % IV SOLN
1.0000 | Freq: Once | INTRAVENOUS | Status: AC
  Administered 2024-03-10: 50 mL via INTRAVENOUS
  Filled 2024-03-10: qty 50

## 2024-03-10 MED ORDER — CALCIUM GLUCONATE-NACL 1-0.675 GM/50ML-% IV SOLN
1.0000 g | Freq: Once | INTRAVENOUS | Status: AC
  Administered 2024-03-10: 1000 mg via INTRAVENOUS
  Filled 2024-03-10: qty 50

## 2024-03-10 NOTE — Progress Notes (Signed)
 PT Cancellation Note  Patient Details Name: Ryan Wells MRN: [id number] DOB: Dec 23, 1955   Cancelled Treatment:    Reason Eval/Treat Not Completed: (P) Patient at procedure or test/unavailable Pt is off the floor for HD. PT will follow back when he comes back to the room as able.   Ryan Wells B. Fleeta Lapidus PT, DPT Acute Rehabilitation Services Please use secure chat or  Call Office [phone]    Ryan Wells Fleeta [facility] 03/10/2024, 8:28 AM

## 2024-03-10 NOTE — Progress Notes (Signed)
 [facility] PROGRESS NOTE   Ryan Wells FMW:[id number] DOB: July 27, 1956 DOA: 03/08/2024  PCP: Adella Norris, MD  Brief History: Patient is a Hispanic male with past medical history significant for ESRD on hemodialysis Monday Wednesday Friday presented to the hospital for generalized weakness and transient lower extremity weakness to the point where he could not walk and did not go for dialysis. Last dialysis session was Friday on 7/25. Initial labs showed hyperkalemia of a potassium of 7.8. Nephrology was consulted and recommended the patient be admitted to the ICU for dialysis session so PCCM was consulted and he was admitted to the intensive care unit and transferred to [facility] service on 03/09/2024.   Consultants: Nephrology.  Cardiology  Procedures: Hemodialysis    Subjective/Interval History: Patient seen in the room initially and then at hemodialysis as he was noted to be tachycardic.  Denies any chest pain shortness of breath.    Assessment/Plan:  ESRD on HD MWF Metabolic Bone Disease Elevated AG Metabolic Acidosis  Hyperkalemia Patient admitted with potassium level greater than 7.5.  Was urgently hemodialyzed.  Improvement in potassium level noted.  Again noted to be elevated this morning.  Nephrology is following.  To be dialyzed today.  Atypical atrial flutter/SVT Seen by cardiology during previous hospitalization.  Noted to be tachycardic this morning on hemodialysis.  Hemodynamically stable.  He is asymptomatic.  Given a 2.5 mg dose of metoprolol  intravenously with some improvement noted in heart rate.  During previous admission while he was on diltiazem  he experienced significant bradycardia which.  He was not discharged on any rate limiting drugs.  Only on Eliquis . Will consult cardiology to assist with management.    Back pain:  CT scan of the lumbar spine suggested discitis.  He was hospitalized in 2022 for discitis.   Underwent MRI of the lumbar  spine which showed L5-S1 changes which are chronic.  No urgent indication to involve neurosurgery at this time.  This can be pursued in the outpatient setting.  Denies significant back pain this morning.  Able to move his lower extremities without any difficulties.  Hypothyroidism Continue levothyroxine . Noted to have a TSH of 9.7 on 7/12 and a free T4 of 0.95 on 7/13.   Chronic Systolic Dysfunction: CAD History: Echocardiogram from earlier this month showed LVEF of 50%. -Volume Maintenance per HD   Diabetes Mellitus Type 2 w/ Hyperlipidemia:  Diet Controlled DM. Add Very Sensitive Novolog  SSI AC/HS. CTM CBGs per Protocol.    Anemia of Chronic Disease (ESRD):  Hemoglobin noted to be low but stable for the most part.  No evidence of overt bleeding.     Thrombocytopenia: Chronic;  Stable counts noted.   DVT Prophylaxis: On Eliquis  Code Status: Full code Family Communication: Discussed with patient Disposition Plan: Hopefully home when improved    Medications: Scheduled:  apixaban   5 mg Oral BID   Chlorhexidine  Gluconate Cloth  6 each Topical Q0600   dextrose        insulin  aspart  0-5 Units Subcutaneous QHS   insulin  aspart  0-6 Units Subcutaneous TID WC   levothyroxine   88 mcg Oral Q0600   sevelamer  carbonate  1,600 mg Oral TID with meals   Continuous: PRN:alteplase , dextrose , docusate sodium , heparin , lidocaine  (PF), lidocaine -prilocaine , pentafluoroprop-tetrafluoroeth, polyethylene glycol, prochlorperazine     Objective:  Vital Signs  Vitals:   03/10/24 0815 03/10/24 0836 03/10/24 0849 03/10/24 0855  BP:  (!) 106/54  (!) 140/118  Pulse: (!) 56 65 (!) 167 (!) 147  Resp:  13 11 12 16   Temp:      TempSrc:      SpO2: 90% 99% 99% 100%  Weight:      Height:        Intake/Output Summary (Last 24 hours) at 03/10/2024 0908 Last data filed at 03/10/2024 0800 Gross per 24 hour  Intake 290 ml  Output 0 ml  Net 290 ml   Filed Weights   03/09/24 0336 03/10/24 0452  03/10/24 0807  Weight: 72.8 kg 57.4 kg 68.9 kg    General appearance: Awake alert.  In no distress Resp: Clear to auscultation bilaterally.  Normal effort Cardio: S1-S2 is normal regular.  No S3-S4.  No rubs murmurs or bruit GI: Abdomen is soft.  Nontender nondistended.  Bowel sounds are present normal.  No masses organomegaly Extremities: No edema.  Full range of motion of lower extremities. Neurologic: Alert and oriented x3.  No focal neurological deficits.    Lab Results:  Data Reviewed: I have personally reviewed following labs and reports of the imaging studies  CBC: Recent Labs  Lab 03/08/24 1221 03/08/24 1234 03/09/24 0244 03/10/24 0323  WBC 8.3  --  6.7 9.0  NEUTROABS 5.5  --   --  6.6  HGB 10.7* 10.9* 8.7* 9.1*  HCT 34.0* 32.0* 26.8* 28.7*  MCV 100.3*  --  97.8 100.0  PLT 139*  --  109* 120*    Basic Metabolic Panel: Recent Labs  Lab 03/08/24 1221 03/08/24 1234 03/08/24 1559 03/09/24 0244 03/10/24 0323  NA 135 132* 135 135 131*  K >7.5* 7.8* 6.5* 4.5 6.3*  CL 94* 99 96* 93* 93*  CO2 24  --  22 28 26   GLUCOSE 125* 123* 117* 154* 114*  BUN 65* 69* 65* 27* 56*  CREATININE 8.52* 8.40* 8.40* 4.48* 6.86*  CALCIUM  9.1  --  9.5 9.0 8.7*  MG 2.6*  --   --   --  2.4  PHOS  --   --   --   --  5.1*    GFR: Estimated Creatinine Clearance: 8.7 mL/min (A) (by C-G formula based on SCr of 6.86 mg/dL (H)).  Liver Function Tests: Recent Labs  Lab 03/08/24 1221 03/10/24 0323  AST 27 15  ALT 26 19  ALKPHOS 137* 105  BILITOT 1.3* 1.0  PROT 7.4 6.8  ALBUMIN  4.0 3.6    CBG: Recent Labs  Lab 03/09/24 2231 03/09/24 2328 03/10/24 0607 03/10/24 0641 03/10/24 0730  GLUCAP 116* 106* 59* 80 88    Recent Results (from the past 240 hours)  MRSA Next Gen by PCR, Nasal     Status: None   Collection Time: 03/08/24  2:50 PM   Specimen: Nasal Mucosa; Nasal Swab  Result Value Ref Range Status   MRSA by PCR Next Gen NOT DETECTED NOT DETECTED Final    Comment:  (NOTE) The GeneXpert MRSA Assay (FDA approved for NASAL specimens only), is one component of a comprehensive MRSA colonization surveillance program. It is not intended to diagnose MRSA infection nor to guide or monitor treatment for MRSA infections. Test performance is not FDA approved in patients less than 51 years old. Performed at [facility], [address], [place], [place] [address]       Radiology Studies: MR LUMBAR SPINE W WO CONTRAST Result Date: 03/10/2024 CLINICAL DATA:  Lumbar radiculopathy, infection suspected, positive xray/CT EXAM: MRI LUMBAR SPINE WITHOUT AND WITH CONTRAST TECHNIQUE: Multiplanar and multiecho pulse sequences of the lumbar spine were obtained without  and with intravenous contrast. CONTRAST:  8mL GADAVIST  GADOBUTROL  1 MMOL/ML IV SOLN COMPARISON:  MRI lumbar spine 03/16/2021. FINDINGS: Segmentation:  Standard. Alignment:  No substantial sagittal subluxation Vertebrae: Comparison 10/01/2020, interval disc collapse and progressive endplate erosive change, compatible with chronic osteomyelitis. Adjacent endplate edema and enhancement, which does appear proved since 2022. Adjacent L5 and S1 endplate edema is persistent but improved. Conus medullaris and cauda equina: Conus extends to the L2-L3 level. Conus and cauda equina appear normal. Paraspinal and other soft tissues: Equivocal trace paraspinal edema along the ventral sacral vertebral bodies. No abscess. Disc levels: T12-L1: No significant disc protrusion, foraminal stenosis, or canal stenosis. L1-L2: No significant disc protrusion, foraminal stenosis, or canal stenosis. L2-L3: Slight left eccentric disc bulging.  No significant stenosis. L3-L4: Mild disc bulging. Bilateral facet arthropathy. Resulting mild left foraminal stenosis. Patent canal and right foramen. L4-L5: Broad disc bulge with moderate bilateral facet arthropathy. Resulting moderate bilateral foraminal stenosis. Patent canal. L5-S1: Findings of  chronic discitis/osteomyelitis detailed above. Severe bilateral facet arthropathy. Resulting severe bilateral foraminal stenosis. IMPRESSION: 1. Findings compatible with chronic L5-S1 discitis/osteomyelitis. Disc edema is improved since 2022, but there has been interval disc collapse and progressive endplate erosive change. There is adjacent endplate marrow edema and enhancement, also improved since 2022. Equivocal trace paraspinal edema without abscess. 2. At L5-S1, severe bilateral foraminal stenosis. 3. At L4-L5, moderate bilateral foraminal stenosis. Electronically Signed   By: Gilmore GORMAN Molt M.D.   On: 03/10/2024 02:13   CT LUMBAR SPINE WO CONTRAST Result Date: 03/08/2024 CLINICAL DATA:  Lower back pain, cauda equina syndrome suspected. Transient bilateral lower extremity weakness. EXAM: CT LUMBAR SPINE WITHOUT CONTRAST TECHNIQUE: Multidetector CT imaging of the lumbar spine was performed without intravenous contrast administration. Multiplanar CT image reconstructions were also generated. RADIATION DOSE REDUCTION: This exam was performed according to the departmental dose-optimization program which includes automated exposure control, adjustment of the mA and/or kV according to patient size and/or use of iterative reconstruction technique. COMPARISON:  CT pelvis 03/17/2021, MRI lumbar spine 03/16/2021. FINDINGS: Segmentation: 5 lumbar type vertebrae. Alignment: Lumbar lordosis is maintained.  No listhesis. Vertebrae: There is irregularity and sclerosis of the L5 inferior endplate and S1 superior endplate which is progressed since the 2022 CT. There is increased irregularity of the L5 inferior endplate with areas of fragmentation and erosive change. There is vacuum disc phenomena noted within the adjacent disc space. Vertebral body heights are otherwise maintained. No additional osseous lesion. Degenerative changes and ankylosis of the sacroiliac joints. Paraspinal and other soft tissues: The paraspinal  musculature is unremarkable. Mild scattered atherosclerosis of the abdominal aorta and branch vessels. Atrophic appearance of the bilateral native kidneys. There is likely cholelithiasis. Ascites noted in the pelvis with additional small in fluid more superiorly within the abdomen. Disc levels: T12-L1: No significant disc bulge. No significant spinal canal stenosis or foraminal stenosis. L1-2: Small disc bulge. Mild facet arthrosis. No significant spinal canal stenosis. Mild foraminal stenosis on the right L2-3: Vacuum disc phenomenon. Diffuse disc bulge. Bilateral facet arthrosis slightly greater on the right. No significant spinal canal stenosis. Mild foraminal stenosis on the right. L3-4: Diffuse disc bulge. Bilateral facet arthrosis and thickening of the ligamentum flavum. Mild spinal canal stenosis. Mild bilateral foraminal stenosis slightly greater on the left. L4-5: Diffuse disc bulge. Severe facet arthrosis. Thickening of the ligamentum flavum. Mild spinal canal stenosis. There is moderate bilateral foraminal stenosis. L5-S1: Endplate erosive changes as above. Small disc bulge and posterior osteophytes. Severe facet arthrosis. There is  erosive change of the bilateral facets more pronounced on the left. Mild spinal canal stenosis. Moderate to severe right and moderate left foraminal stenosis. IMPRESSION: Interval progression of irregularity and sclerosis of the L5-S1 endplates. Findings likely reflect sequelae of discitis/osteomyelitis. Recommend correlation with MRI lumbar spine with and without contrast for further evaluation. Degenerative changes as above. Foraminal stenosis most pronounced on the right at L5-S1. Electronically Signed   By: Donnice Mania M.D.   On: 03/08/2024 15:01   DG Chest Portable 1 View Result Date: 03/08/2024 CLINICAL DATA:  Near syncope. EXAM: PORTABLE CHEST 1 VIEW COMPARISON:  February 20, 2024 FINDINGS: The cardiac silhouette is enlarged and unchanged in size. Low lung volumes are  noted with multiple overlying cardiac lead wires and defibrillator leads. No acute infiltrate, pleural effusion or pneumothorax is identified. Postoperative changes are present within the lower cervical spine. No acute osseous abnormalities are seen. IMPRESSION: Low lung volumes without acute or active cardiopulmonary disease. Electronically Signed   By: Suzen Dials M.D.   On: 03/08/2024 13:35       LOS: 2 days   Taraoluwa Thakur Verdene  [facility] Pager on [email or web address]  03/10/2024, 9:08 AM

## 2024-03-10 NOTE — TOC Initial Note (Signed)
 Transition of Care [facility]) - Initial/Assessment Note    Patient Details  Name: Ryan Wells MRN: [id number] Date of Birth: June 05, 1956  [facility]) CM/SW Contact:    Sudie Erminio Deems, RN Phone Number: 03/10/2024, 3:44 PM  Clinical Narrative: Patient transferred from unit 8M. Risk for readmission assessment completed. Patient presented for generalized weakness. Patient has hx ESRD MWF- patient uses [facility] to get to HD appointments. Interpreter Raquel is at the bedside for Interpreter. PTA patient was from home with spouse and children. Patient reports that he has a PCP and gets to appointments. Patient has insurance and he states he is able to afford [employer]. Patient does not use any DME in the home and does not have a history of home health services. Case Manager will continue to follow for additional disposition needs as the patient progresses.               Expected Discharge Plan: Home/Self Care Barriers to Discharge: Continued Medical Work up   Patient Goals and CMS Choice Patient states their goals for this hospitalization and ongoing recovery are:: Plan to return home with spouse   Choice offered to / list presented to : NA      Expected Discharge Plan and Services In-house Referral: NA Discharge Planning Services: CM Consult Post Acute Care Choice: NA Living arrangements for the past 2 months: Single Family Home                   DME Agency: NA                  Prior Living Arrangements/Services Living arrangements for the past 2 months: Single Family Home Lives with:: Spouse Patient language and need for interpreter reviewed:: Yes (Raquel Interpreter called to the bedside @ 1530) Do you feel safe going back to the place where you live?: Yes      Need for Family Participation in Patient Care: Yes (Comment) Care giver support system in place?: Yes (comment)   Criminal Activity/Legal  Involvement Pertinent to Current Situation/Hospitalization: No - Comment as needed  Activities of Daily Living   ADL Screening (condition at time of admission) Independently performs ADLs?: Yes (appropriate for developmental age) Is the patient deaf or have difficulty hearing?: No Does the patient have difficulty seeing, even when wearing glasses/contacts?: No Does the patient have difficulty concentrating, remembering, or making decisions?: No  Permission Sought/Granted Permission sought to share information with : Family Supports, Case Production designer, theatre/television/film, Magazine features editor                Emotional Assessment Appearance:: Appears stated age Attitude/Demeanor/Rapport: Engaged Affect (typically observed): Appropriate Orientation: : Oriented to Self, Oriented to Place, Oriented to  Time, Oriented to Situation Alcohol / Substance Use: Not Applicable Psych Involvement: No (comment)  Admission diagnosis:  Hyperkalemia [E87.5] Bradycardia [R00.1] ESRD (end stage renal disease) (HCC) [N18.6] Patient Active Problem List   Diagnosis Date Noted   Abnormal echocardiogram 02/23/2024   Hypotension due to drugs 02/22/2024   Atrial flutter (HCC) 02/21/2024   Tachycardia 02/20/2024   Food insecurity 09/23/2023   Hyperlipidemia associated with type 2 diabetes mellitus (HCC) 04/22/2023   Diabetic peripheral neuropathy associated with type 2 diabetes mellitus (HCC) 07/15/2022   Edema of both lower legs 07/15/2022   Elevated LDL cholesterol level 06/05/2022   Pleural effusion 03/15/2021   Fluid overload 03/15/2021   Elevated troponin 03/15/2021   Lumbar back pain 03/15/2021   Leg  pain 03/15/2021   Leukocytosis 03/15/2021   History of bacteremia 03/15/2021   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 [place] [address] Phone: [phone] Fax: [phone]     Social Drivers of Health (SDOH) Social History: SDOH Screenings   Food Insecurity: No Food Insecurity (03/08/2024)  Housing: Low Risk  (03/08/2024)  Transportation Needs: No Transportation Needs (03/08/2024)  Recent Concern: Transportation Needs - Unmet Transportation Needs (02/20/2024)  Utilities: Not At Risk (03/08/2024)  Financial Resource Strain: Medium Risk (09/23/2023)  Social Connections: Patient Declined (03/08/2024)  Recent Concern: Social Connections - Socially Isolated (02/20/2024)  Tobacco Use: Medium Risk (03/08/2024)   SDOH Interventions:     Readmission Risk Interventions    03/10/2024    3:42 PM 02/23/2024    4:45 PM  Readmission Risk Prevention Plan  Transportation Screening Complete Complete  Home Care Screening  Complete  Medication Review (RN CM)  Complete  HRI or Home Care Consult Complete   Social Work Consult for Recovery Care Planning/Counseling Complete   Palliative Care Screening Not Applicable   Medication Review Oceanographer) Referral to Pharmacy

## 2024-03-10 NOTE — Significant Event (Signed)
 Rapid Response Event Note   Reason for Call :  Bradycardia  Initial Focused Assessment:  Upon arrival patient is alert and oriented he denies chest pain or shortness of breath.  He states that he feels good.  He states that while he was sleeping he felt far away.  Upon review of telemetry:  patient had a 5 sec pause then HR in the 20s.  When RN entered the room he was unresponsive.  HR improved to 50s-70s and he was fully alert.  Dr Okey came to bedside to assess patient  BP 135/59  HR 68  RR 16  O2 sat 95% on RA  Interventions:  EKG done Placed patient on 2L [place] [facility] ordered  Plan of Care:  RN to call if patient becomes bradycardic   Event Summary:   MD Notified: Dr Verdene & Dr Okey Call Time: 1811 Arrival Time: 1814 End Time: 1845  Elvin [place], RN

## 2024-03-10 NOTE — Consult Note (Addendum)
 Cardiology Consultation   Patient ID: Ryan Wells MRN: [id number]; DOB: 09/10/1955  Admit date: 03/08/2024 Date of Consult: 03/10/2024  PCP:  Adella Norris, MD   [facility] Providers Cardiologist:  Lonni LITTIE Nanas, MD        Patient Profile: Ryan Wells is a 68 y.o. male with a hx of ESRD on HD, HTN, DM, prior psoas abscess with MSSA bacteremia, atrial flutter, bradycardia, syncope, anemia of chronic disease, thrombocytopenia by labs who is being seen 03/10/2024 for the evaluation of atrial flutter at the request of Dr. Verdene.  History of Present Illness: Ryan Wells remotely saw Dr. Lonni in 2020 for renal transplant pre-op  eval. Stress echo 09/2019 was negative for ischemia but non-diagnostic due to inadequate heart rate response. F/u nuc 12/2019 was normal and EF normal by echo at that time as well. In 01/2021 he was admitted with bacteremia and had unremarkable TEE. He later re-established with Dr. Nanas 08/2023 for occasional chest pain and recurrent syncope over the span of 5-6 years, unconscious for hours after one event. Echo, cardiac PET and monitor were ordered but not completed. Previous notes indicate the patient had started that he was planning on wearing the monitor but both his PCP and dialysis staff told him he should not have it on as they are not responsible for it. He was more recently seen during recent admission 7/11-7/14/25 when he had presented with tachycardia/weakness felt to be related to atrial flutter (at times difficult to tell per notes), initially treated with IV diltiazem  and TID metoprolol . He subsequently developed hypotension with bradycardia after HD with HR 30s-40s (as low as 28), requiring atropine , calcium  gluconate, multiple fluid boluses and brief Levophed  with improvement.  Initial echo 02/20/24 showed possible EF 35-40%, moderate TR, was a technically difficult study while tachycardic. F/u limited echo  02/22/24 showed EF 50%, mild LVH, mildly enlarged RV, mildly elevated PASP, mild MR, mild BAE. He was started on Eliquis . No AVN blocker at DC. F/u scheduled 03/09/24 but patient returned to the ED in the meantime. He reports the last time he fully passed out was about 3 months ago.   History obtained via [employer]. He presented back to the hospital with weakness to the point where he could not walk. He physically felt as though is legs would not hold him and he required assistance to stand. He also had vomiting as well. ED note stated that patient arrived with rates of 50s and with what appears to be a heart block. This was not captured on EKG, but telemetry showed the following marked bradycardic pattern:      Potassium was noted to be 7.8 requiring medication intervention and urgent dialysis. Since admission he has continued to have a variety of arrhythmias alternating between bradycardic (appearing to be junctional, sinus bradycardia, idioventricular rhythm) and tachycardic (at times appearing to be SVT 170s, atrial flutter, brief NSVT) as posted below. Of note, lumbar spine CT scan showed sequelae from discitis, degenerative changes with subsequent lumbar spine MRI showing findings compatible with chronic L5-S1 discitis/osteomyelitis, severe L5-S1 stenosis, moderate L4-L5 stenosis. Labs also continue to show ongoing issues with hyperkalemia requiring Lokelma .                 Most recently:      He received 2.5mg  IV metoprolol  earlier today and remains in NSR/SB 50s, feeling much better, able to ambulate to the bathroom without difficulty. Denies any recent CP or SOB.  Past Medical History:  Diagnosis Date   3rd nerve palsy, partial, right 01/03/2017   AAA (abdominal aortic aneurysm) [facility])    Not noted on CT abd 2019   Chest pain 11/2013   Normal Echo/ EKG/enzymes-hospitalized.  Did not get outpatient stress testing following hospitalization.  No chest  pain since   Chronic kidney disease    on dialysis Tues, Thurs and Sat   Diabetes mellitus without complication (HCC)    Diabetes type 2, uncontrolled 11/25/2011   no meds, diet controlled per patient   Diabetic gastroparesis (HCC) 01/22/2017   Hypertension    no meds   Microalbuminuria 01/19/2017   Myocardial infarction [facility]) 2015    Past Surgical History:  Procedure Laterality Date   A/V FISTULAGRAM Left 06/04/2019   Procedure: A/V FISTULAGRAM;  Surgeon: Eliza Lonni RAMAN, MD;  Location: [facility] INVASIVE CV LAB;  Service: Cardiovascular;  Laterality: Left;   A/V FISTULAGRAM Left 10/15/2019   Procedure: A/V FISTULAGRAM;  Surgeon: Eliza Lonni RAMAN, MD;  Location: [facility] INVASIVE CV LAB;  Service: Cardiovascular;  Laterality: Left;   A/V FISTULAGRAM N/A 01/24/2021   Procedure: A/V FISTULAGRAM - Left Upper;  Surgeon: Magda Debby SAILOR, MD;  Location: [facility] INVASIVE CV LAB;  Service: Cardiovascular;  Laterality: N/A;   AV FISTULA PLACEMENT Left 07/02/2018   Procedure: BRACHIOCEPHALIC ARTERIOVENOUS (AV) FISTULA CREATION LEFT ARM;  Surgeon: Serene Gaile ORN, MD;  Location: [facility] OR;  Service: Vascular;  Laterality: Left;   BASCILIC VEIN TRANSPOSITION Left 07/26/2019   Procedure: Bascilic Vein Transposition;  Surgeon: Eliza Lonni RAMAN, MD;  Location: [facility] OR;  Service: Vascular;  Laterality: Left;   COLONOSCOPY     EMBOLIZATION (CATH LAB) Left 06/04/2019   Procedure: EMBOLIZATION;  Surgeon: Eliza Lonni RAMAN, MD;  Location: [facility] INVASIVE CV LAB;  Service: Cardiovascular;  Laterality: Left;  LT ARM FISTULA/COMPETING BRANCH   EYE SURGERY Bilateral    cataracts x2   EYE SURGERY     INSERTION OF DIALYSIS CATHETER Right 06/08/2019   Procedure: INSERTION OF PALINDROME DIALYSIS CATHETER IN RIGHT INTERNAL JUGULAR;  Surgeon: Eliza Lonni RAMAN, MD;  Location: [facility] OR;  Service: Vascular;  Laterality: Right;   LIGATION OF ARTERIOVENOUS  FISTULA Left 07/26/2019   Procedure: Ligation Of BrachioCephalic   Fistula;  Surgeon: Eliza Lonni RAMAN, MD;  Location: [facility] OR;  Service: Vascular;  Laterality: Left;   NECK SURGERY     PERIPHERAL VASCULAR BALLOON ANGIOPLASTY Left 06/04/2019   Procedure: PERIPHERAL VASCULAR BALLOON ANGIOPLASTY;  Surgeon: Eliza Lonni RAMAN, MD;  Location: [facility] INVASIVE CV LAB;  Service: Cardiovascular;  Laterality: Left;  ARM FISTULA   PERIPHERAL VASCULAR BALLOON ANGIOPLASTY Left 10/15/2019   Procedure: PERIPHERAL VASCULAR BALLOON ANGIOPLASTY;  Surgeon: Eliza Lonni RAMAN, MD;  Location: [facility] INVASIVE CV LAB;  Service: Cardiovascular;  Laterality: Left;  arm fistula   TEE WITHOUT CARDIOVERSION N/A 01/19/2021   Procedure: TRANSESOPHAGEAL ECHOCARDIOGRAM (TEE);  Surgeon: Jeffrie Oneil BROCKS, MD;  Location: [facility] ENDOSCOPY;  Service: Cardiovascular;  Laterality: N/A;      Scheduled Meds:  apixaban   5 mg Oral BID   Chlorhexidine  Gluconate Cloth  6 each Topical Q0600   dextrose        insulin  aspart  0-5 Units Subcutaneous QHS   insulin  aspart  0-6 Units Subcutaneous TID WC   levothyroxine   88 mcg Oral Q0600   sevelamer  carbonate  1,600 mg Oral TID with meals   Continuous Infusions:  PRN Meds: dextrose , docusate sodium , polyethylene glycol, prochlorperazine   Allergies:   No Known Allergies  Social History:   Social History   Socioeconomic History   Marital status: Married    Spouse name: Ezella Flint Dodson Valdemar   Number of children: 3   Years of education: 1 year university   Highest education level: Not on file  Occupational History   Occupation: unemployed  Tobacco Use   Smoking status: Former   Smokeless tobacco: Never  Advertising account planner   Vaping status: Never Used  Substance and Sexual Activity   Alcohol use: Not Currently   Drug use: Never   Sexual activity: Yes  Other Topics Concern   Not on file  Social History Narrative   Lives in [place] neighborhood with wife     2 sons live locally      Social Drivers of Health   Financial Resource Strain:  Medium Risk (09/23/2023)   Overall Financial Resource Strain (CARDIA)    Difficulty of Paying Living Expenses: Somewhat hard  Food Insecurity: No Food Insecurity (03/08/2024)   Hunger Vital Sign    Worried About Running Out of Food in the Last Year: Never true    Ran Out of Food in the Last Year: Never true  Transportation Needs: No Transportation Needs (03/08/2024)   PRAPARE - Administrator, Civil Service (Medical): No    Lack of Transportation (Non-Medical): No  Recent Concern: Transportation Needs - Unmet Transportation Needs (02/20/2024)   PRAPARE - Administrator, Civil Service (Medical): Yes    Lack of Transportation (Non-Medical): Yes  Physical Activity: Not on file  Stress: Not on file  Social Connections: Patient Declined (03/08/2024)   Social Connection and Isolation Panel    Frequency of Communication with Friends and Family: Patient declined    Frequency of Social Gatherings with Friends and Family: Patient declined    Attends Religious Services: Patient declined    Database administrator or Organizations: Patient declined    Attends Banker Meetings: Patient declined    Marital Status: Patient declined  Recent Concern: Social Connections - Socially Isolated (02/20/2024)   Social Connection and Isolation Panel    Frequency of Communication with Friends and Family: Never    Frequency of Social Gatherings with Friends and Family: Never    Attends Religious Services: Never    Database administrator or Organizations: No    Attends Banker Meetings: Never    Marital Status: Never married  Intimate Partner Violence: Not At Risk (03/08/2024)   Humiliation, Afraid, Rape, and Kick questionnaire    Fear of Current or Ex-Partner: No    Emotionally Abused: No    Physically Abused: No    Sexually Abused: No    Family History:   Family History  Problem Relation Age of Onset   Heart disease Mother        cause of death--CHF?    Diabetes Father    Kidney disease Father        Dialysis for kidney failure   Diabetes Sister    Diabetes Brother    Colon cancer Neg Hx    Stomach cancer Neg Hx    Esophageal cancer Neg Hx      ROS:  Please see the history of present illness.  All other ROS reviewed and negative.     Physical Exam/Data: Vitals:   03/10/24 1130 03/10/24 1145 03/10/24 1149 03/10/24 1306  BP: (!) 96/54 116/60 (!) 121/57 102/60  Pulse: 81 (!) 118 (!) 116 (!) 59  Resp: 10  14 12 16   Temp:   98.2 F (36.8 C) 98.5 F (36.9 C)  TempSrc:    Oral  SpO2: 99% 100% 100% 96%  Weight:    68.9 kg  Height:    5' 2 (1.575 m)    Intake/Output Summary (Last 24 hours) at 03/10/2024 1446 Last data filed at 03/10/2024 0800 Gross per 24 hour  Intake 50 ml  Output 0 ml  Net 50 ml      03/10/2024    1:06 PM 03/10/2024    8:07 AM 03/10/2024    4:52 AM  Last 3 Weights  Weight (lbs) 152 lb 151 lb 14.4 oz 126 lb 8.7 oz  Weight (kg) 68.947 kg 68.9 kg 57.4 kg     Body mass index is 27.8 kg/m.  General: Well developed, well nourished, in no acute distress. Head: Normocephalic, atraumatic, sclera non-icteric, no xanthomas, nares are without discharge. Neck: Negative for carotid bruits. JVP not elevated. Lungs: Clear bilaterally to auscultation without wheezes, rales, or rhonchi. Breathing is unlabored. Heart: RRR S1 S2 without murmurs, rubs, or gallops.  Abdomen: Soft, non-tender, non-distended with normoactive bowel sounds. No rebound/guarding. Extremities: No clubbing or cyanosis. No edema. Distal pedal pulses are 2+ and equal bilaterally. Neuro: Alert and oriented X 3. Moves all extremities spontaneously. Psych:  Responds to questions appropriately with a normal affect.   EKG:  The EKG was personally reviewed and demonstrates:    Initial tracing: NSR 80bpm NSIVCD old inferior infarct nonspecific STTW changes  Today: narrow complex tachycardia with occasional sinus beating with short atrial runs vs atrial  flutter, rightward axis, 151bpm  Telemetry:  Telemetry was personally reviewed and demonstrates:  see images above  Relevant CV Studies:  Full echo 02/20/24   1. Left ventricular ejection fraction, by estimation, is 35 to 40%. The  left ventricle has moderately decreased function. The left ventricle  demonstrates global hypokinesis. There is mild left ventricular  hypertrophy. Left ventricular diastolic  parameters are indeterminate.   2. Right ventricular systolic function is mildly reduced. The right  ventricular size is normal. There is moderately elevated pulmonary artery  systolic pressure. The estimated right ventricular systolic pressure is  48.4 mmHg.   3. The mitral valve is degenerative. Trivial mitral valve regurgitation.   4. Tricuspid valve regurgitation is moderate.   5. The aortic valve is tricuspid. Aortic valve regurgitation is not  visualized. Aortic valve sclerosis/calcification is present, without any  evidence of aortic stenosis.   6. The inferior vena cava is dilated in size with <50% respiratory  variability, suggesting right atrial pressure of 15 mmHg.   Conclusion(s)/Recommendation(s): Technically difficult study, patient  tachycardic to 140s. Would consider repeat limited echo with contrast once  heart rate improved.    Limited echo 02/22/24   1. Left ventricular ejection fraction, by estimation, is 50%. The left  ventricle has low normal function. Left ventricular endocardial border not  optimally defined to evaluate regional wall motion. There is mild left  ventricular hypertrophy.   2. Right ventricular systolic function is normal. The right ventricular  size is mildly enlarged. There is mildly elevated pulmonary artery  systolic pressure. The estimated right ventricular systolic pressure is  37.5 mmHg.   3. Left atrial size was mildly dilated.   4. Right atrial size was mildly dilated.   5. The mitral valve is degenerative. Mild mitral valve  regurgitation.   6. The aortic valve is tricuspid. There is mild calcification of the  aortic valve. There is  mild thickening of the aortic valve.   7. The inferior vena cava is dilated in size with <50% respiratory  variability, suggesting right atrial pressure of 15 mmHg.   Laboratory Data: High Sensitivity Troponin:   Recent Labs  Lab 02/20/24 1340 02/20/24 1707 03/08/24 1232  TROPONINIHS 25* 25* 35*     Chemistry Recent Labs  Lab 03/08/24 1221 03/08/24 1234 03/08/24 1559 03/09/24 0244 03/10/24 0323 03/10/24 0838  NA 135   < > 135 135 131*  --   K >7.5*   < > 6.5* 4.5 6.3* 5.3*  CL 94*   < > 96* 93* 93*  --   CO2 24  --  22 28 26   --   GLUCOSE 125*   < > 117* 154* 114*  --   BUN 65*   < > 65* 27* 56*  --   CREATININE 8.52*   < > 8.40* 4.48* 6.86*  --   CALCIUM  9.1  --  9.5 9.0 8.7*  --   MG 2.6*  --   --   --  2.4  --   GFRNONAA 6*  --  6* 13* 8*  --   ANIONGAP 17*  --  17* 14 12  --    < > = values in this interval not displayed.    Recent Labs  Lab 03/08/24 1221 03/10/24 0323  PROT 7.4 6.8  ALBUMIN  4.0 3.6  AST 27 15  ALT 26 19  ALKPHOS 137* 105  BILITOT 1.3* 1.0   Lipids No results for input(s): CHOL, TRIG, HDL, LABVLDL, LDLCALC, CHOLHDL in the last 168 hours.  Hematology Recent Labs  Lab 03/08/24 1221 03/08/24 1234 03/09/24 0244 03/10/24 0323  WBC 8.3  --  6.7 9.0  RBC 3.39*  --  2.74* 2.87*  HGB 10.7* 10.9* 8.7* 9.1*  HCT 34.0* 32.0* 26.8* 28.7*  MCV 100.3*  --  97.8 100.0  MCH 31.6  --  31.8 31.7  MCHC 31.5  --  32.5 31.7  RDW 15.4  --  15.3 15.3  PLT 139*  --  109* 120*   Thyroid No results for input(s): TSH, FREET4 in the last 168 hours.  BNP Recent Labs  Lab 03/08/24 1235  BNP 1,227.1*    DDimer No results for input(s): DDIMER in the last 168 hours.  Radiology/Studies:  MR LUMBAR SPINE W WO CONTRAST Result Date: 03/10/2024 CLINICAL DATA:  Lumbar radiculopathy, infection suspected, positive xray/CT EXAM: MRI  LUMBAR SPINE WITHOUT AND WITH CONTRAST TECHNIQUE: Multiplanar and multiecho pulse sequences of the lumbar spine were obtained without and with intravenous contrast. CONTRAST:  8mL GADAVIST  GADOBUTROL  1 MMOL/ML IV SOLN COMPARISON:  MRI lumbar spine 03/16/2021. FINDINGS: Segmentation:  Standard. Alignment:  No substantial sagittal subluxation Vertebrae: Comparison 10/01/2020, interval disc collapse and progressive endplate erosive change, compatible with chronic osteomyelitis. Adjacent endplate edema and enhancement, which does appear proved since 2022. Adjacent L5 and S1 endplate edema is persistent but improved. Conus medullaris and cauda equina: Conus extends to the L2-L3 level. Conus and cauda equina appear normal. Paraspinal and other soft tissues: Equivocal trace paraspinal edema along the ventral sacral vertebral bodies. No abscess. Disc levels: T12-L1: No significant disc protrusion, foraminal stenosis, or canal stenosis. L1-L2: No significant disc protrusion, foraminal stenosis, or canal stenosis. L2-L3: Slight left eccentric disc bulging.  No significant stenosis. L3-L4: Mild disc bulging. Bilateral facet arthropathy. Resulting mild left foraminal stenosis. Patent canal and right foramen. L4-L5: Broad disc bulge with moderate bilateral facet arthropathy. Resulting  moderate bilateral foraminal stenosis. Patent canal. L5-S1: Findings of chronic discitis/osteomyelitis detailed above. Severe bilateral facet arthropathy. Resulting severe bilateral foraminal stenosis. IMPRESSION: 1. Findings compatible with chronic L5-S1 discitis/osteomyelitis. Disc edema is improved since 2022, but there has been interval disc collapse and progressive endplate erosive change. There is adjacent endplate marrow edema and enhancement, also improved since 2022. Equivocal trace paraspinal edema without abscess. 2. At L5-S1, severe bilateral foraminal stenosis. 3. At L4-L5, moderate bilateral foraminal stenosis. Electronically Signed    By: Gilmore GORMAN Molt M.D.   On: 03/10/2024 02:13   CT LUMBAR SPINE WO CONTRAST Result Date: 03/08/2024 CLINICAL DATA:  Lower back pain, cauda equina syndrome suspected. Transient bilateral lower extremity weakness. EXAM: CT LUMBAR SPINE WITHOUT CONTRAST TECHNIQUE: Multidetector CT imaging of the lumbar spine was performed without intravenous contrast administration. Multiplanar CT image reconstructions were also generated. RADIATION DOSE REDUCTION: This exam was performed according to the departmental dose-optimization program which includes automated exposure control, adjustment of the mA and/or kV according to patient size and/or use of iterative reconstruction technique. COMPARISON:  CT pelvis 03/17/2021, MRI lumbar spine 03/16/2021. FINDINGS: Segmentation: 5 lumbar type vertebrae. Alignment: Lumbar lordosis is maintained.  No listhesis. Vertebrae: There is irregularity and sclerosis of the L5 inferior endplate and S1 superior endplate which is progressed since the 2022 CT. There is increased irregularity of the L5 inferior endplate with areas of fragmentation and erosive change. There is vacuum disc phenomena noted within the adjacent disc space. Vertebral body heights are otherwise maintained. No additional osseous lesion. Degenerative changes and ankylosis of the sacroiliac joints. Paraspinal and other soft tissues: The paraspinal musculature is unremarkable. Mild scattered atherosclerosis of the abdominal aorta and branch vessels. Atrophic appearance of the bilateral native kidneys. There is likely cholelithiasis. Ascites noted in the pelvis with additional small in fluid more superiorly within the abdomen. Disc levels: T12-L1: No significant disc bulge. No significant spinal canal stenosis or foraminal stenosis. L1-2: Small disc bulge. Mild facet arthrosis. No significant spinal canal stenosis. Mild foraminal stenosis on the right L2-3: Vacuum disc phenomenon. Diffuse disc bulge. Bilateral facet  arthrosis slightly greater on the right. No significant spinal canal stenosis. Mild foraminal stenosis on the right. L3-4: Diffuse disc bulge. Bilateral facet arthrosis and thickening of the ligamentum flavum. Mild spinal canal stenosis. Mild bilateral foraminal stenosis slightly greater on the left. L4-5: Diffuse disc bulge. Severe facet arthrosis. Thickening of the ligamentum flavum. Mild spinal canal stenosis. There is moderate bilateral foraminal stenosis. L5-S1: Endplate erosive changes as above. Small disc bulge and posterior osteophytes. Severe facet arthrosis. There is erosive change of the bilateral facets more pronounced on the left. Mild spinal canal stenosis. Moderate to severe right and moderate left foraminal stenosis. IMPRESSION: Interval progression of irregularity and sclerosis of the L5-S1 endplates. Findings likely reflect sequelae of discitis/osteomyelitis. Recommend correlation with MRI lumbar spine with and without contrast for further evaluation. Degenerative changes as above. Foraminal stenosis most pronounced on the right at L5-S1. Electronically Signed   By: Donnice Mania M.D.   On: 03/08/2024 15:01   DG Chest Portable 1 View Result Date: 03/08/2024 CLINICAL DATA:  Near syncope. EXAM: PORTABLE CHEST 1 VIEW COMPARISON:  February 20, 2024 FINDINGS: The cardiac silhouette is enlarged and unchanged in size. Low lung volumes are noted with multiple overlying cardiac lead wires and defibrillator leads. No acute infiltrate, pleural effusion or pneumothorax is identified. Postoperative changes are present within the lower cervical spine. No acute osseous abnormalities are seen. IMPRESSION: Low lung volumes  without acute or active cardiopulmonary disease. Electronically Signed   By: Suzen Dials M.D.   On: 03/08/2024 13:35     Assessment and Plan:  1. Leg weakness, ?multifactorial, with tachy-brady noted on telemetry as well as abnormal lumbar MR, in setting of severe hyperkalemia with  ESRD on HD - chronic discitis/osteomyelitis noted on MRI, prior hx MSSA bacteremia - per primary team - cardiac eval as below  2. Tachycardia-bradycardia syndrome with variety of rhythms including sinus bradycardia, junctional bradycardia, idioventricular rhythm, transient pauses following ectopy, NSVT, SVT, atrial flutter - this appears to have been an issue both last admission and this admission primarily occurring in the context of hyperkalemia  - complex case; treatment of SVT, NSVT, atrial flutter made challenging by intermittent bradycardia as well - though may be electrolyte mediated - will review with MD  3. Possible transient cardiomyopathy earlier this month - EF 35-40% by initial echo but tachycardic during study, f/u EF 50% by limited study when in NSR last admission - no signs of decompensation otherwise today - GDMT limited by hypotension at this time  4. History of chest pain - previously recommended for cardiac PET as outpatient but never completed - in absence of chest pain, consider holding off for now especially since this will require Lexiscan  but has underlying bradycardia hx above  5. History of syncope - last episode 3 months ago, prior to that had had several episodes spread through half a decade  - consider event monitor at discharge to further track, anticipate EP f/u would be helpful  6. Abnormal TSH - TSH elevated 2 weeks ago, repeat in AM with free T4  Risk Assessment/Risk Scores:       [place]  Heart Association (NYHA) Functional Class NYHA Class II  CHA2DS2-VASc Score = 4   This indicates a 4.8% annual risk of stroke. The patient's score is based upon: CHF History: 1 (? EF 35-40 by echo 02/2024, possibly affected by tachycardia) HTN History: 1 Diabetes History: 1 Stroke History: 0 Vascular Disease History: 0 Age Score: 1 Gender Score: 0      For questions or updates, please contact [facility] Please consult [email or web address] for  contact info under    Signed, Dayna N Dunn, PA-C  03/10/2024 2:46 PM  Patient seen and examined   I agree with findings as noted by D DUnn above  Pt is a 68 yo with hx of HTN, atrial flutter, bradycardia,syncope, bradycardia    Followed by JAYSON Nanas.   Hx occasional CP and recurrent syncope   Testing ordered (echo, PET, monitor) but not done . Admitted 02/20/24 with tachycardia   Rx IV diltiazem  and metoprolol   Complicated by bradycarida and hypotension (requiring Rx with atropine , pressors)  Echo on 02/22/24 LVEF 50%   Pt discharged home on Eliquis   but no AV nodal blocking drugs     Presented to ER on 7/29 feeling weak     Telemetry with marked bradycardia and tachycardia noted in images above Telemetry showing junctional, atrial flutter with RVR, possible atrial fib, NSVT)   This was all in setting of profound hyperkalemia   K 7.8   He was treated with meds and emergent dialysis.   Since K has normalized he has not had any further arrhythmias  Talking to pt with help of interpreter he denies CP  Breathing is oK He says he did not miss any dialysis sessions    He denies eating any food that is high in postassium  ON exam, pt is comfortable in bed Neck  JVP is normal Lungs are CTA Cardiac RRR  no murmurs    Abd is supple   Nontender  No masses Ext are without LE edema   2+ PT pulses   Rhythm:   Pt had multiple dysrhythmias that  all occurred in setting of profound hyperkalemia   This has been corrected    Would follow on telemetry He will need very close follow up of KCL as outpt Pt should be on Eliquis  with hx of atrial flutter  I would avoid nodal blocking agents    I would recomm Zio patch after d/c to track further   HFrEF   Limited echo prior to d/c showed LVEF approximately 50%   I have reviewed images and agree   Follow BP.  Volume control by dialysis  Hx CP  Pt denies    As noted above by D Dunn I would not jump to further evaluation now  given no hx of CP     Vina Gull MD

## 2024-03-10 NOTE — Progress Notes (Signed)
 Pt had 5.5 second pause and episode of bradycardia   HR 20s to 30s     He was sleeping at time, said he was dreaming   Woke up with sternal rub. HR quickly responded to 60s  EKG SR  Pt denies dizziness  No CP    Did tell translater that ne does not snore.  He does have dreams sometimes where I go very, very far away  Plan:  Pacing pads placed Will get BMET  Add 2 L O2 (sats 96) incase desats during sleep  Follow on tele and sats  For now interventional service notified but no plans for temp wire   Continue to follow   Vina Gull MD

## 2024-03-10 NOTE — Progress Notes (Signed)
 Patient's telemetry alarming asystole/bradycardia. This RN went to room to assess patient and found patient to be unresponsive with HR in the 20s. Patient was initially unresponsive to sternal rub, then began to respond and HR returned to the 70s. Zoll pads placed on patient. Patient was able to verbalize that he did not have any chest pain or any other symptoms.This RN called rapid response and notified Dr. Verdene and cardiology. EKG was obtained. Dr. Okey came to the unit to review telemetry.

## 2024-03-10 NOTE — Progress Notes (Signed)
 OT Cancellation Note  Patient Details Name: Ryan Wells MRN: [id number] DOB: Oct 26, 1955   Cancelled Treatment:    Reason Eval/Treat Not Completed: Patient at procedure or test/ unavailable Pt is off the floor for HD. PT will follow back when he comes back to the room as able.   Etta KATHEE, OT Acute Rehabilitation Services Office [phone] Secure Chat Preferred    Etta GORMAN Hope 03/10/2024, 9:22 AM

## 2024-03-10 NOTE — Progress Notes (Addendum)
 Patient has been in atrial fibrillation, had an episode of symptomatic bradycardia, and recovered spontaneously. He is not on any AV nodal blocking agents. Plan to continue cardiac monitoring, check chemistry panel now.   Addendum: Potassium is 6.3. Plan to change diet to renal (low potassium) diet, administer Lokelma , insulin /dextrose , calcium , and albuterol , and repeat potassium level in 4 hours.

## 2024-03-10 NOTE — Procedures (Signed)
 I was present at this dialysis session. I have reviewed the session itself and made appropriate changes.   Complicated by atrial fibrillation as evidenced by EKG.  Metoprolol  provided by primary team.  Limiting ultrafiltration.  Lokelma  given today.  Persistent hyperkalemia.  Would try discharging on Lokelma .  Unclear if he would be able to obtain it from a cost perspective in the outpatient setting  Filed Weights   03/09/24 0336 03/10/24 0452 03/10/24 0807  Weight: 72.8 kg 57.4 kg 68.9 kg    Recent Labs  Lab 03/10/24 0323 03/10/24 0838  NA 131*  --   K 6.3* 5.3*  CL 93*  --   CO2 26  --   GLUCOSE 114*  --   BUN 56*  --   CREATININE 6.86*  --   CALCIUM  8.7*  --   PHOS 5.1*  --     Recent Labs  Lab 03/08/24 1221 03/08/24 1234 03/09/24 0244 03/10/24 0323  WBC 8.3  --  6.7 9.0  NEUTROABS 5.5  --   --  6.6  HGB 10.7* 10.9* 8.7* 9.1*  HCT 34.0* 32.0* 26.8* 28.7*  MCV 100.3*  --  97.8 100.0  PLT 139*  --  109* 120*    Scheduled Meds:  apixaban   5 mg Oral BID   Chlorhexidine  Gluconate Cloth  6 each Topical Q0600   dextrose        insulin  aspart  0-5 Units Subcutaneous QHS   insulin  aspart  0-6 Units Subcutaneous TID WC   levothyroxine   88 mcg Oral Q0600   sevelamer  carbonate  1,600 mg Oral TID with meals   Continuous Infusions: PRN Meds:.alteplase , dextrose , docusate sodium , heparin , lidocaine  (PF), lidocaine -prilocaine , pentafluoroprop-tetrafluoroeth, polyethylene glycol, prochlorperazine    Jayson Player,  MD 03/10/2024, 12:09 PM

## 2024-03-11 ENCOUNTER — Other Ambulatory Visit: Payer: Self-pay | Admitting: Student

## 2024-03-11 DIAGNOSIS — N186 End stage renal disease: Secondary | ICD-10-CM | POA: Diagnosis not present

## 2024-03-11 DIAGNOSIS — I10 Essential (primary) hypertension: Secondary | ICD-10-CM

## 2024-03-11 DIAGNOSIS — I4892 Unspecified atrial flutter: Secondary | ICD-10-CM | POA: Diagnosis not present

## 2024-03-11 DIAGNOSIS — I5030 Unspecified diastolic (congestive) heart failure: Secondary | ICD-10-CM

## 2024-03-11 DIAGNOSIS — I428 Other cardiomyopathies: Secondary | ICD-10-CM | POA: Diagnosis not present

## 2024-03-11 DIAGNOSIS — R001 Bradycardia, unspecified: Secondary | ICD-10-CM | POA: Diagnosis not present

## 2024-03-11 DIAGNOSIS — R000 Tachycardia, unspecified: Secondary | ICD-10-CM | POA: Diagnosis not present

## 2024-03-11 DIAGNOSIS — E875 Hyperkalemia: Secondary | ICD-10-CM | POA: Diagnosis not present

## 2024-03-11 LAB — GLUCOSE, CAPILLARY
Glucose-Capillary: 119 mg/dL — ABNORMAL HIGH (ref 70–99)
Glucose-Capillary: 115 mg/dL — ABNORMAL HIGH (ref 70–99)
Glucose-Capillary: 151 mg/dL — ABNORMAL HIGH (ref 70–99)
Glucose-Capillary: 157 mg/dL — ABNORMAL HIGH (ref 70–99)

## 2024-03-11 LAB — POTASSIUM
Potassium: 5.1 mmol/L (ref 3.5–5.1)
Potassium: 5.2 mmol/L — ABNORMAL HIGH (ref 3.5–5.1)
Potassium: 5 mmol/L (ref 3.5–5.1)

## 2024-03-11 LAB — T4, FREE: Free T4: 1.08 ng/dL (ref 0.61–1.12)

## 2024-03-11 LAB — TSH: TSH: 11.82 u[IU]/mL — ABNORMAL HIGH (ref 0.350–4.500)

## 2024-03-11 MED ORDER — DARBEPOETIN ALFA 60 MCG/0.3ML IJ SOSY: 60.0000 ug | PREFILLED_SYRINGE | INTRAMUSCULAR | Status: DC

## 2024-03-11 MED ORDER — SODIUM ZIRCONIUM CYCLOSILICATE 10 G PO PACK
10.0000 g | PACK | ORAL | Status: DC
  Administered 2024-03-11: 10 g via ORAL
  Filled 2024-03-11: qty 1

## 2024-03-11 MED ORDER — CHLORHEXIDINE GLUCONATE CLOTH 2 % EX PADS
6.0000 | MEDICATED_PAD | Freq: Every day | CUTANEOUS | Status: DC
  Administered 2024-03-12: 6 via TOPICAL

## 2024-03-11 NOTE — Progress Notes (Signed)
 [facility] PROGRESS NOTE   Ryan Wells FMW:[id number] DOB: 01-17-1956 DOA: 03/08/2024  PCP: Adella Norris, MD  Brief History: Patient is a Hispanic male with past medical history significant for ESRD on hemodialysis Monday Wednesday Friday presented to the hospital for generalized weakness and transient lower extremity weakness to the point where he could not walk and did not go for dialysis. Last dialysis session was Friday on 7/25. Initial labs showed hyperkalemia of a potassium of 7.8. Nephrology was consulted and recommended the patient be admitted to the ICU for dialysis session so PCCM was consulted and he was admitted to the intensive care unit and transferred to [facility] service on 03/09/2024.   Consultants: Nephrology.  Cardiology  Procedures: Hemodialysis    Subjective/Interval History: Interpreter services were utilized.  Patient denies any chest pain shortness of breath.  Slept well.  Denies any back pain.      Assessment/Plan:  ESRD on HD MWF Metabolic Bone Disease Elevated AG Metabolic Acidosis  Hyperkalemia Patient admitted with potassium level greater than 7.5.  Was urgently hemodialyzed. Potassium levels have stabilized. Nephrology is following.  Atypical atrial flutter/SVT Seen by cardiology during previous hospitalization.   Patient was noted to be in RVR yesterday.  Was given 2.5 mg of metoprolol  intravenously.  Heart rate improved.  Subsequently became bradycardic perhaps a brief moment of unresponsiveness.  Stabilized quickly without any intervention.  Has been stable through the night.  Heart rate occasionally in the 40s. Seen by cardiology this morning.  They want to ambulate him and see how he does.  Has arrhythmias thought to be secondary to electrolyte imbalance. Continue to monitor.   Continue Eliquis . Echocardiogram from earlier this month showed LVEF of 50%.  Back pain:  CT scan of the lumbar spine suggested discitis.  He was  hospitalized in 2022 for discitis.   Underwent MRI of the lumbar spine which showed L5-S1 changes which are chronic.  No urgent indication to involve neurosurgery at this time.  This can be pursued in the outpatient setting.   Patient's presentation with weakness and back pain might have been due to his electrolyte imbalance rather than his spinal issues.  He absolutely denies any back pain this morning and is able to move his legs without any evidence for weakness.  No further workup at this time.    Hypothyroidism Continue levothyroxine . Noted to have a TSH of 9.7 on 7/12 and a free T4 of 0.95 on 7/13.   Chronic Systolic Dysfunction: CAD History: Echocardiogram from earlier this month showed LVEF of 50%. -Volume Maintenance per HD   Diabetes Mellitus Type 2 w/ Hyperlipidemia:  Diet Controlled DM. Add Very Sensitive Novolog  SSI AC/HS. CTM CBGs per Protocol.    Anemia of Chronic Disease (ESRD):  Hemoglobin noted to be low but stable for the most part.  No evidence of overt bleeding.     Thrombocytopenia: Chronic;  Stable counts noted.   DVT Prophylaxis: On Eliquis  Code Status: Full code Family Communication: Discussed with patient Disposition Plan: Hopefully home soon.    Medications: Scheduled:  apixaban   5 mg Oral BID   Chlorhexidine  Gluconate Cloth  6 each Topical Q0600   insulin  aspart  0-5 Units Subcutaneous QHS   insulin  aspart  0-6 Units Subcutaneous TID WC   levothyroxine   88 mcg Oral Q0600   sevelamer  carbonate  1,600 mg Oral TID with meals   Continuous: PRN:docusate sodium , polyethylene glycol    Objective:  Vital Signs  Vitals:   03/11/24  0056 03/11/24 0421 03/11/24 0650 03/11/24 0809  BP:  (!) 121/41  (!) 132/47  Pulse:  63    Resp: 18   18  Temp: 97.6 F (36.4 C) 99 F (37.2 C)  97.7 F (36.5 C)  TempSrc: Oral Oral  Oral  SpO2:  100%    Weight:   69.3 kg   Height:        Intake/Output Summary (Last 24 hours) at 03/11/2024 0955 Last data  filed at 03/11/2024 9191 Gross per 24 hour  Intake 810 ml  Output --  Net 810 ml   Filed Weights   03/10/24 0807 03/10/24 1306 03/11/24 0650  Weight: 68.9 kg 68.9 kg 69.3 kg    General appearance: Awake alert.  In no distress Resp: Clear to auscultation bilaterally.  Normal effort Cardio: S1-S2 is normal regular.  No S3-S4.  No rubs murmurs or bruit GI: Abdomen is soft.  Nontender nondistended.  Bowel sounds are present normal.  No masses organomegaly Extremities: No edema.  Full range of motion of lower extremities. Neurologic: Alert and oriented x3.  No focal neurological deficits.    Lab Results:  Data Reviewed: I have personally reviewed following labs and reports of the imaging studies  CBC: Recent Labs  Lab 03/08/24 1221 03/08/24 1234 03/09/24 0244 03/10/24 0323  WBC 8.3  --  6.7 9.0  NEUTROABS 5.5  --   --  6.6  HGB 10.7* 10.9* 8.7* 9.1*  HCT 34.0* 32.0* 26.8* 28.7*  MCV 100.3*  --  97.8 100.0  PLT 139*  --  109* 120*    Basic Metabolic Panel: Recent Labs  Lab 03/08/24 1221 03/08/24 1234 03/08/24 1559 03/09/24 0244 03/10/24 0323 03/10/24 0838 03/10/24 1417 03/10/24 1650 03/10/24 1955 03/11/24 0433 03/11/24 0842  NA 135 132* 135 135 131*  --   --   --  133*  --   --   K >7.5* 7.8* 6.5* 4.5 6.3*   < > 4.1 4.7 4.9 5.1 5.2*  CL 94* 99 96* 93* 93*  --   --   --  93*  --   --   CO2 24  --  22 28 26   --   --   --  30  --   --   GLUCOSE 125* 123* 117* 154* 114*  --   --   --  174*  --   --   BUN 65* 69* 65* 27* 56*  --   --   --  32*  --   --   CREATININE 8.52* 8.40* 8.40* 4.48* 6.86*  --   --   --  4.56*  --   --   CALCIUM  9.1  --  9.5 9.0 8.7*  --   --   --  8.9  --   --   MG 2.6*  --   --   --  2.4  --   --   --   --   --   --   PHOS  --   --   --   --  5.1*  --   --   --   --   --   --    < > = values in this interval not displayed.    GFR: Estimated Creatinine Clearance: 13.1 mL/min (A) (by C-G formula based on SCr of 4.56 mg/dL (H)).  Liver  Function Tests: Recent Labs  Lab 03/08/24 1221 03/10/24 0323  AST 27 15  ALT 26 19  ALKPHOS 137* 105  BILITOT 1.3* 1.0  PROT 7.4 6.8  ALBUMIN  4.0 3.6    CBG: Recent Labs  Lab 03/10/24 1225 03/10/24 1708 03/10/24 1814 03/10/24 2103 03/11/24 0754  GLUCAP 77 145* 156* 177* 119*    Recent Results (from the past 240 hours)  MRSA Next Gen by PCR, Nasal     Status: None   Collection Time: 03/08/24  2:50 PM   Specimen: Nasal Mucosa; Nasal Swab  Result Value Ref Range Status   MRSA by PCR Next Gen NOT DETECTED NOT DETECTED Final    Comment: (NOTE) The GeneXpert MRSA Assay (FDA approved for NASAL specimens only), is one component of a comprehensive MRSA colonization surveillance program. It is not intended to diagnose MRSA infection nor to guide or monitor treatment for MRSA infections. Test performance is not FDA approved in patients less than 9 years old. Performed at [facility], [address], [place], [place] [address]       Radiology Studies: MR LUMBAR SPINE W WO CONTRAST Result Date: 03/10/2024 CLINICAL DATA:  Lumbar radiculopathy, infection suspected, positive xray/CT EXAM: MRI LUMBAR SPINE WITHOUT AND WITH CONTRAST TECHNIQUE: Multiplanar and multiecho pulse sequences of the lumbar spine were obtained without and with intravenous contrast. CONTRAST:  8mL GADAVIST  GADOBUTROL  1 MMOL/ML IV SOLN COMPARISON:  MRI lumbar spine 03/16/2021. FINDINGS: Segmentation:  Standard. Alignment:  No substantial sagittal subluxation Vertebrae: Comparison 10/01/2020, interval disc collapse and progressive endplate erosive change, compatible with chronic osteomyelitis. Adjacent endplate edema and enhancement, which does appear proved since 2022. Adjacent L5 and S1 endplate edema is persistent but improved. Conus medullaris and cauda equina: Conus extends to the L2-L3 level. Conus and cauda equina appear normal. Paraspinal and other soft tissues: Equivocal trace paraspinal edema along  the ventral sacral vertebral bodies. No abscess. Disc levels: T12-L1: No significant disc protrusion, foraminal stenosis, or canal stenosis. L1-L2: No significant disc protrusion, foraminal stenosis, or canal stenosis. L2-L3: Slight left eccentric disc bulging.  No significant stenosis. L3-L4: Mild disc bulging. Bilateral facet arthropathy. Resulting mild left foraminal stenosis. Patent canal and right foramen. L4-L5: Broad disc bulge with moderate bilateral facet arthropathy. Resulting moderate bilateral foraminal stenosis. Patent canal. L5-S1: Findings of chronic discitis/osteomyelitis detailed above. Severe bilateral facet arthropathy. Resulting severe bilateral foraminal stenosis. IMPRESSION: 1. Findings compatible with chronic L5-S1 discitis/osteomyelitis. Disc edema is improved since 2022, but there has been interval disc collapse and progressive endplate erosive change. There is adjacent endplate marrow edema and enhancement, also improved since 2022. Equivocal trace paraspinal edema without abscess. 2. At L5-S1, severe bilateral foraminal stenosis. 3. At L4-L5, moderate bilateral foraminal stenosis. Electronically Signed   By: Gilmore GORMAN Molt M.D.   On: 03/10/2024 02:13       LOS: 3 days   Ryan Wells  [facility] Pager on [email or web address]  03/11/2024, 9:55 AM

## 2024-03-11 NOTE — Evaluation (Signed)
 Occupational Therapy Evaluation and Discharge Patient Details Name: Ryan Wells MRN: [id number] DOB: [birth year] Today's Date: 03/11/2024   History of Present Illness   Pt is a 68 year old man admitted with generalized weakness on 03/08/24. + severe hyperkalemia and missed HD. Hospital course complicated by SVT with afib in HD on 03/10/24 and bradycardia with unresponsiveness on 03/10/24. PMH: ESRD on HD, HTN, DM2, MSSA bacteremia, chronic L5-S1 discitis/osteomyelitis, lumbar stenosis, peripheral neuropathy.     Clinical Impressions Pt is functioning at or near his baseline of modified independence in ADLs. He ambulated in hall with supervision and [facility] for safety. Educated in fall prevention and recommended pt consider seated showering. Pt receptive to education. VSS throughout session on RA. No further OT needs.      If plan is discharge home, recommend the following:   Assistance with cooking/housework;Assist for transportation     Functional Status Assessment   Patient has had a recent decline in their functional status and demonstrates the ability to make significant improvements in function in a reasonable and predictable amount of time.     Equipment Recommendations   None recommended by OT     Recommendations for Other Services         Precautions/Restrictions   Precautions Precautions: Fall Recall of Precautions/Restrictions: Intact     Mobility Bed Mobility Overal bed mobility: Modified Independent                  Transfers Overall transfer level: Modified independent Equipment used: Straight cane                      Balance                                           ADL either performed or assessed with clinical judgement   ADL Overall ADL's : At baseline                                             Vision Baseline Vision/History: 1 Wears glasses Ability to See in Adequate Light: 0  Adequate Patient Visual Report: No change from baseline Additional Comments: reading glasses     Perception         Praxis         Pertinent Vitals/Pain Pain Assessment Pain Assessment: Faces Faces Pain Scale: No hurt     Extremity/Trunk Assessment Upper Extremity Assessment Upper Extremity Assessment: Overall WFL for tasks assessed;Right hand dominant (edematous L UE)   Lower Extremity Assessment Lower Extremity Assessment: Defer to PT evaluation   Cervical / Trunk Assessment Cervical / Trunk Assessment: Normal   Communication Communication Communication: No apparent difficulties Factors Affecting Communication: Non - English speaking, interpreter not available Ryan Wells [phone])   Cognition Arousal: Alert Behavior During Therapy: WFL for tasks assessed/performed Cognition: No apparent impairments                               Following commands: Intact       Cueing  General Comments   Cueing Techniques: Verbal cues      Exercises     Shoulder Instructions      Home Living Family/patient expects to be  discharged to:: Private residence Living Arrangements: Spouse/significant other;Children Available Help at Discharge: Family;Available PRN/intermittently Type of Home: House Home Access: Stairs to enter Entrance Stairs-Number of Steps: 1   Home Layout: Two level;Bed/bath upstairs Alternate Level Stairs-Number of Steps: flight   Bathroom Shower/Tub: Chief Strategy Officer: Standard     Home Equipment: Agricultural consultant (2 wheels);Cane - single point          Prior Functioning/Environment Prior Level of Function : Independent/Modified Independent             Mobility Comments: walks with cane or walker particularly on HD days ADLs Comments: independent, stands to shower    OT Problem List:     OT Treatment/Interventions:        OT Goals(Current goals can be found in the care plan section)       OT Frequency:        Co-evaluation              AM-PAC OT 6 Clicks Daily Activity     Outcome Measure Help from another person eating meals?: None Help from another person taking care of personal grooming?: None Help from another person toileting, which includes using toliet, bedpan, or urinal?: None Help from another person bathing (including washing, rinsing, drying)?: None Help from another person to put on and taking off regular upper body clothing?: None Help from another person to put on and taking off regular lower body clothing?: None 6 Click Score: 24   End of Session Equipment Utilized During Treatment: Gait belt;Other (comment) (SPC)  Activity Tolerance: Patient tolerated treatment well Patient left: in bed;with call bell/phone within reach  OT Visit Diagnosis: Unsteadiness on feet (R26.81)                Time: 8643-8557 OT Time Calculation (min): 46 min Charges:  OT General Charges $OT Visit: 1 Visit OT Evaluation $OT Eval Low Complexity: 1 Low  Mliss HERO, OTR/L Acute Rehabilitation Services Office: [phone]   Kennth Mliss Helling 03/11/2024, 3:08 PM

## 2024-03-11 NOTE — Progress Notes (Addendum)
 Rounding Note   Patient Name: Ryan Wells Date of Encounter: 03/11/2024  [facility] Cardiologist: Ryan LITTIE Nanas, MD   Subjective  Pt denies dizziness  No CP   NO SOB   Scheduled Meds:  apixaban   5 mg Oral BID   Chlorhexidine  Gluconate Cloth  6 each Topical Q0600   insulin  aspart  0-5 Units Subcutaneous QHS   insulin  aspart  0-6 Units Subcutaneous TID WC   levothyroxine   88 mcg Oral Q0600   sevelamer  carbonate  1,600 mg Oral TID with meals   Continuous Infusions:  PRN Meds: docusate sodium , polyethylene glycol   Vital Signs  Vitals:   03/10/24 2022 03/11/24 0056 03/11/24 0421 03/11/24 0650  BP:   (!) 121/41   Pulse: (!) 56  63   Resp:  18    Temp:  97.6 F (36.4 C) 99 F (37.2 C)   TempSrc:  Oral Oral   SpO2: 100%  100%   Weight:    69.3 kg  Height:        Intake/Output Summary (Last 24 hours) at 03/11/2024 0811 Last data filed at 03/11/2024 9191 Gross per 24 hour  Intake 810 ml  Output --  Net 810 ml      03/11/2024    6:50 AM 03/10/2024    1:06 PM 03/10/2024    8:07 AM  Last 3 Weights  Weight (lbs) 152 lb 12.8 oz 152 lb 151 lb 14.4 oz  Weight (kg) 69.31 kg 68.947 kg 68.9 kg      Telemetry 5.5 sec pause with bradycardia yesterday afternoon while sleeping   Quickly increased when awakened   SInce then no significant pauses   HR 40s to 60   - Personally Reviewed  ECG  SB - Personally Reviewed   Tests:  Limited echo 02/22/24   1. Left ventricular ejection fraction, by estimation, is 50%. The left  ventricle has low normal function. Left ventricular endocardial border not  optimally defined to evaluate regional wall motion. There is mild left  ventricular hypertrophy.   2. Right ventricular systolic function is normal. The right ventricular  size is mildly enlarged. There is mildly elevated pulmonary artery  systolic pressure. The estimated right ventricular systolic pressure is  37.5 mmHg.   3. Left atrial size was  mildly dilated.   4. Right atrial size was mildly dilated.   5. The mitral valve is degenerative. Mild mitral valve regurgitation.   6. The aortic valve is tricuspid. There is mild calcification of the  aortic valve. There is mild thickening of the aortic valve.   7. The inferior vena cava is dilated in size with <50% respiratory  variability, suggesting right atrial pressure of 15 mmHg.     Full echo 02/20/24   1. Left ventricular ejection fraction, by estimation, is 35 to 40%. The  left ventricle has moderately decreased function. The left ventricle  demonstrates global hypokinesis. There is mild left ventricular  hypertrophy. Left ventricular diastolic  parameters are indeterminate.   2. Right ventricular systolic function is mildly reduced. The right  ventricular size is normal. There is moderately elevated pulmonary artery  systolic pressure. The estimated right ventricular systolic pressure is  48.4 mmHg.   3. The mitral valve is degenerative. Trivial mitral valve regurgitation.   4. Tricuspid valve regurgitation is moderate.   5. The aortic valve is tricuspid. Aortic valve regurgitation is not  visualized. Aortic valve sclerosis/calcification is present, without any  evidence of aortic stenosis.  6. The inferior vena cava is dilated in size with <50% respiratory  variability, suggesting right atrial pressure of 15 mmHg.   Conclusion(s)/Recommendation(s): Technically difficult study, patient  tachycardic to 140s. Would consider repeat limited echo with contrast once  heart rate improved.   Physical Exam  GEN: No acute distress.   Neck: No JVD Cardiac: RRR, no murmur Respiratory: Clear to auscultation GI: Soft, nontender, no masses MS: No edema;  Labs High Sensitivity Troponin:   Recent Labs  Lab 02/20/24 1340 02/20/24 1707 03/08/24 1232  TROPONINIHS 25* 25* 35*     Chemistry Recent Labs  Lab 03/08/24 1221 03/08/24 1234 03/09/24 0244 03/10/24 0323  03/10/24 0838 03/10/24 1650 03/10/24 1955 03/11/24 0433  NA 135   < > 135 131*  --   --  133*  --   K >7.5*   < > 4.5 6.3*   < > 4.7 4.9 5.1  CL 94*   < > 93* 93*  --   --  93*  --   CO2 24   < > 28 26  --   --  30  --   GLUCOSE 125*   < > 154* 114*  --   --  174*  --   BUN 65*   < > 27* 56*  --   --  32*  --   CREATININE 8.52*   < > 4.48* 6.86*  --   --  4.56*  --   CALCIUM  9.1   < > 9.0 8.7*  --   --  8.9  --   MG 2.6*  --   --  2.4  --   --   --   --   PROT 7.4  --   --  6.8  --   --   --   --   ALBUMIN  4.0  --   --  3.6  --   --   --   --   AST 27  --   --  15  --   --   --   --   ALT 26  --   --  19  --   --   --   --   ALKPHOS 137*  --   --  105  --   --   --   --   BILITOT 1.3*  --   --  1.0  --   --   --   --   GFRNONAA 6*   < > 13* 8*  --   --  13*  --   ANIONGAP 17*   < > 14 12  --   --  10  --    < > = values in this interval not displayed.    Lipids No results for input(s): CHOL, TRIG, HDL, LABVLDL, LDLCALC, CHOLHDL in the last 168 hours.  Hematology Recent Labs  Lab 03/08/24 1221 03/08/24 1234 03/09/24 0244 03/10/24 0323  WBC 8.3  --  6.7 9.0  RBC 3.39*  --  2.74* 2.87*  HGB 10.7* 10.9* 8.7* 9.1*  HCT 34.0* 32.0* 26.8* 28.7*  MCV 100.3*  --  97.8 100.0  MCH 31.6  --  31.8 31.7  MCHC 31.5  --  32.5 31.7  RDW 15.4  --  15.3 15.3  PLT 139*  --  109* 120*   Thyroid  Recent Labs  Lab 03/11/24 0432 03/11/24 0433  TSH 11.820*  --   FREET4  --  1.08  BNP Recent Labs  Lab 03/08/24 1235  BNP 1,227.1*    DDimer No results for input(s): DDIMER in the last 168 hours.   Radiology  MR LUMBAR SPINE W WO CONTRAST Result Date: 03/10/2024 CLINICAL DATA:  Lumbar radiculopathy, infection suspected, positive xray/CT EXAM: MRI LUMBAR SPINE WITHOUT AND WITH CONTRAST TECHNIQUE: Multiplanar and multiecho pulse sequences of the lumbar spine were obtained without and with intravenous contrast. CONTRAST:  8mL GADAVIST  GADOBUTROL  1 MMOL/ML IV SOLN  COMPARISON:  MRI lumbar spine 03/16/2021. FINDINGS: Segmentation:  Standard. Alignment:  No substantial sagittal subluxation Vertebrae: Comparison 10/01/2020, interval disc collapse and progressive endplate erosive change, compatible with chronic osteomyelitis. Adjacent endplate edema and enhancement, which does appear proved since 2022. Adjacent L5 and S1 endplate edema is persistent but improved. Conus medullaris and cauda equina: Conus extends to the L2-L3 level. Conus and cauda equina appear normal. Paraspinal and other soft tissues: Equivocal trace paraspinal edema along the ventral sacral vertebral bodies. No abscess. Disc levels: T12-L1: No significant disc protrusion, foraminal stenosis, or canal stenosis. L1-L2: No significant disc protrusion, foraminal stenosis, or canal stenosis. L2-L3: Slight left eccentric disc bulging.  No significant stenosis. L3-L4: Mild disc bulging. Bilateral facet arthropathy. Resulting mild left foraminal stenosis. Patent canal and right foramen. L4-L5: Broad disc bulge with moderate bilateral facet arthropathy. Resulting moderate bilateral foraminal stenosis. Patent canal. L5-S1: Findings of chronic discitis/osteomyelitis detailed above. Severe bilateral facet arthropathy. Resulting severe bilateral foraminal stenosis. IMPRESSION: 1. Findings compatible with chronic L5-S1 discitis/osteomyelitis. Disc edema is improved since 2022, but there has been interval disc collapse and progressive endplate erosive change. There is adjacent endplate marrow edema and enhancement, also improved since 2022. Equivocal trace paraspinal edema without abscess. 2. At L5-S1, severe bilateral foraminal stenosis. 3. At L4-L5, moderate bilateral foraminal stenosis. Electronically Signed   By: Gilmore GORMAN Molt M.D.   On: 03/10/2024 02:13    Cardiac Studies  PAZ WINSETT is a 68 y.o. male with a hx of ESRD on HD, HTN, DM, prior psoas abscess with MSSA bacteremia, atrial flutter,  bradycardia, syncope, anemia of chronic disease, thrombocytopenia by labs who is being seen 03/10/2024 for the evaluation of atrial flutter at the request of Dr. Verdene.  Patient Profile   68 y.o. male   Assessment & Plan   1  Rhythm  SInce admit pt has had multilple arrhythmias  MOst occurred in setting of profound hyperkalemia (atrial flultter, NSVT, SVT, junctional bradycardia, SB)    Once K normalized these have mostly subsided    Yesterday afternoon he did have a pause of 5.5 sec while sleeping      SInce then SB  Rates mostly 50     Recomm  Pt will need close follow up    K this am 4.9     Ambulate and watch HR Pt should go home with a 4 wk live monitor ALso would set up for sleep study  ? If he has apnea leading to bradycardai  Pt to stay on Eliquis    2 HFimpEF  EF 35-40% by initial echo but tachycardic during study, f/u EF 50% by limited study when in NSR last admission -Volume appears OK (controlled by dialysis) He was not on any meds at home prior to admit   3 Hx CP    - previously recommended for cardiac PET as outpatient but never completed - in absence of chest pain, consider holding off for now especially since this will require Lexiscan  but has underlying bradycardia hx above  4. History of syncope- last episode 3 months ago, prior to that had had several episodes spread through half a decade  -Will schedule monitor  5  HTN  BP is labile   given dialysis   Follow for now for trends    [facility]  6  Sleep   Pt reports very vivid dreams    Sees God, Has sensation at times that he is far/far away.     Will set up for sleep eval  ? If apnea contributing   After event of yesterday afternoon, I did order 2 L [place]    Will need to see what he does as outpt during study  If ambulates OK pt is stable for d/c  Will arrange for follow up For questions or updates, please contact [facility] Please consult [email or web address] for contact info  under     Signed, Vina Gull, MD  03/11/2024, 8:11 AM

## 2024-03-11 NOTE — Progress Notes (Signed)
 [facility] Progress Note   Subjective:    Laptop interpreter used during this encounter. Patient now transferred to the medical floor. Seen and examined at bedside. Patient went into SVT/Afib on HD yesterday. An EKG was performed and Metoprolol  was given. Cardiology now on board. Noted he had a 5.5 sec pause overnight while sleeping. Pacing pads were placed and labs drawn. K+ stabilized but now 5.2. Next HD 8/1.   Objective Vitals:   03/11/24 0056 03/11/24 0421 03/11/24 0650 03/11/24 0809  BP:  (!) 121/41  (!) 132/47  Pulse:  63    Resp: 18   18  Temp: 97.6 F (36.4 C) 99 F (37.2 C)  97.7 F (36.5 C)  TempSrc: Oral Oral  Oral  SpO2:  100%    Weight:   69.3 kg   Height:       Physical Exam General: Awake, alert, on RA, NAD Heart: S1 and S2; No murmurs, gallops, or rubs Lungs: Clear throughout Abdomen: Soft and non-tender Extremities: No LE edema Dialysis Access: L AVF   Filed Weights   03/10/24 0807 03/10/24 1306 03/11/24 0650  Weight: 68.9 kg 68.9 kg 69.3 kg    Intake/Output Summary (Last 24 hours) at 03/11/2024 1052 Last data filed at 03/11/2024 9191 Gross per 24 hour  Intake 810 ml  Output --  Net 810 ml    Additional Objective Labs: Basic Metabolic Panel: Recent Labs  Lab 03/09/24 0244 03/10/24 0323 03/10/24 0838 03/10/24 1955 03/11/24 0433 03/11/24 0842  NA 135 131*  --  133*  --   --   K 4.5 6.3*   < > 4.9 5.1 5.2*  CL 93* 93*  --  93*  --   --   CO2 28 26  --  30  --   --   GLUCOSE 154* 114*  --  174*  --   --   BUN 27* 56*  --  32*  --   --   CREATININE 4.48* 6.86*  --  4.56*  --   --   CALCIUM  9.0 8.7*  --  8.9  --   --   PHOS  --  5.1*  --   --   --   --    < > = values in this interval not displayed.   Liver Function Tests: Recent Labs  Lab 03/08/24 1221 03/10/24 0323  AST 27 15  ALT 26 19  ALKPHOS 137* 105  BILITOT 1.3* 1.0  PROT 7.4 6.8  ALBUMIN  4.0 3.6   No results for input(s): LIPASE, AMYLASE in the last  168 hours. CBC: Recent Labs  Lab 03/08/24 1221 03/08/24 1234 03/09/24 0244 03/10/24 0323  WBC 8.3  --  6.7 9.0  NEUTROABS 5.5  --   --  6.6  HGB 10.7* 10.9* 8.7* 9.1*  HCT 34.0* 32.0* 26.8* 28.7*  MCV 100.3*  --  97.8 100.0  PLT 139*  --  109* 120*   Blood Culture    Component Value Date/Time   SDES ABSCESS 03/19/2021 1709   SPECREQUEST LEFT PSOAS 03/19/2021 1709   CULT  03/19/2021 1709    FEW STAPHYLOCOCCUS AUREUS NO ANAEROBES ISOLATED Performed at [facility], [address], [place], [place] [address]    REPTSTATUS 03/24/2021 FINAL 03/19/2021 1709    Cardiac Enzymes: No results for input(s): CKTOTAL, CKMB, CKMBINDEX, TROPONINI in the last 168 hours. CBG: Recent Labs  Lab 03/10/24 1225 03/10/24 1708 03/10/24 1814 03/10/24 2103 03/11/24 0754  GLUCAP  77 145* 156* 177* 119*   Iron  Studies: No results for input(s): IRON , TIBC, TRANSFERRIN, FERRITIN in the last 72 hours. Lab Results  Component Value Date   INR 1.1 03/19/2021   Studies/Results: MR LUMBAR SPINE W WO CONTRAST Result Date: 03/10/2024 CLINICAL DATA:  Lumbar radiculopathy, infection suspected, positive xray/CT EXAM: MRI LUMBAR SPINE WITHOUT AND WITH CONTRAST TECHNIQUE: Multiplanar and multiecho pulse sequences of the lumbar spine were obtained without and with intravenous contrast. CONTRAST:  8mL GADAVIST  GADOBUTROL  1 MMOL/ML IV SOLN COMPARISON:  MRI lumbar spine 03/16/2021. FINDINGS: Segmentation:  Standard. Alignment:  No substantial sagittal subluxation Vertebrae: Comparison 10/01/2020, interval disc collapse and progressive endplate erosive change, compatible with chronic osteomyelitis. Adjacent endplate edema and enhancement, which does appear proved since 2022. Adjacent L5 and S1 endplate edema is persistent but improved. Conus medullaris and cauda equina: Conus extends to the L2-L3 level. Conus and cauda equina appear normal. Paraspinal and other soft tissues: Equivocal trace  paraspinal edema along the ventral sacral vertebral bodies. No abscess. Disc levels: T12-L1: No significant disc protrusion, foraminal stenosis, or canal stenosis. L1-L2: No significant disc protrusion, foraminal stenosis, or canal stenosis. L2-L3: Slight left eccentric disc bulging.  No significant stenosis. L3-L4: Mild disc bulging. Bilateral facet arthropathy. Resulting mild left foraminal stenosis. Patent canal and right foramen. L4-L5: Broad disc bulge with moderate bilateral facet arthropathy. Resulting moderate bilateral foraminal stenosis. Patent canal. L5-S1: Findings of chronic discitis/osteomyelitis detailed above. Severe bilateral facet arthropathy. Resulting severe bilateral foraminal stenosis. IMPRESSION: 1. Findings compatible with chronic L5-S1 discitis/osteomyelitis. Disc edema is improved since 2022, but there has been interval disc collapse and progressive endplate erosive change. There is adjacent endplate marrow edema and enhancement, also improved since 2022. Equivocal trace paraspinal edema without abscess. 2. At L5-S1, severe bilateral foraminal stenosis. 3. At L4-L5, moderate bilateral foraminal stenosis. Electronically Signed   By: Gilmore GORMAN Molt M.D.   On: 03/10/2024 02:13    Medications:   apixaban   5 mg Oral BID   Chlorhexidine  Gluconate Cloth  6 each Topical Q0600   insulin  aspart  0-5 Units Subcutaneous QHS   insulin  aspart  0-6 Units Subcutaneous TID WC   levothyroxine   88 mcg Oral Q0600   sevelamer  carbonate  1,600 mg Oral TID with meals    Dialysis Orders: MWF - [facility] 3hrs68min, BFR 400, DFR  Auto 1.5,  EDW 66.7kg, 2K/2Ca Mircera 30 mcg - last given on 02/27/24; Dose raised to 75mcg Q4 wks Calcitriol  2mcg PO qHD - last dose 03/05/24  Assessment/Plan: Generalized weakness -associated with hyperkalemia.  Seems to be improved ESRD - on HD MWF.  Received urgent dialysis Monday for severe hyperkalemia. HD yesterday c/b Afib/AVT. Next HD  8/1. Hyperkalemia -potassium in the outpatient setting mostly around 4.5-5.5.  Severe hyperkalemia at admit.  Unclear what set this off. Level stabilized overnight but it coming back up. Will start Lokelma  10GM on non-HD days for now. However, unclear if he would be able to obtain it from a cost perspective in the outpatient setting  Arrhythmia - In the setting of above. Cardiology following: recommends live monitor X 4 weeks and arranging for sleep study Bradycardia: Associated with hyperkalemia. Bradycardic overnight. Now fluctuating between brady and SR. Hypertension/volume  - Current BP acceptable. Euvolemic on exam Anemia of CKD - Hgb 9.1. Will start ESA here Secondary Hyperparathyroidism -  Phos 5.8. CTM. Continue Calcitriol  Nutrition - Renal diet with fluid restriction  Charmaine Piety, NP [facility] 03/11/2024,10:52 AM  LOS: 3  days

## 2024-03-11 NOTE — Progress Notes (Signed)
 Patient refused midnight Potassium draw. Reports being stuck more than twice. Would not let another phlebotomist attempt blood collection at this time. Agreed to have 0400 laboratory draw but ONE TIME  Juleen Distel, BSN, RN

## 2024-03-11 NOTE — Evaluation (Addendum)
 Physical Therapy Brief Evaluation and Discharge Note Patient Details Name: Ryan Wells MRN: [id number] DOB: September 14, 1955 Today's Date: 03/11/2024   History of Present Illness  Pt is a 68 year old man admitted with generalized weakness on 03/08/24. + severe hyperkalemia and missed HD. Hospital course complicated by SVT with afib in HD on 03/10/24 and bradycardia with unresponsiveness on 03/10/24. PMH: ESRD on HD, HTN, DM2, MSSA bacteremia, chronic L5-S1 discitis/osteomyelitis, lumbar stenosis, peripheral neuropathy.  Clinical Impression  Pt is at or close to baseline functioning and should be safe at home with PRN assist from family. There are no further acute PT needs.  Will sign off at this time.        PT Assessment    Assistance Needed at Discharge       Equipment Recommendations None recommended by PT  Recommendations for Other Services       Precautions/Restrictions Precautions Precautions: Fall Recall of Precautions/Restrictions: Intact        Mobility  Bed Mobility          Transfers Overall transfer level: Modified independent Equipment used: Straight cane                    Ambulation/Gait Ambulation/Gait assistance: Supervision Gait Distance (Feet): 300 Feet Assistive device: Straight cane Gait Pattern/deviations: Step-through pattern   General Gait Details: Pt was generally steady statically and dynamically with the cane in hand, stable and moderate speed with the cane, mildly unsteady backing up and t\urning around.  VSS  Home Activity Instructions    Stairs Stairs: Yes Stairs assistance: Contact guard assist Stair Management: One rail Right Number of Stairs: 5 General stair comments: safe with the rail and/or cane  Modified Rankin (Stroke Patients Only)        Balance   Sitting-balance support: No upper extremity supported Sitting balance-Leahy Scale: Good     Standing balance support: No upper extremity  supported Standing balance-Leahy Scale: Good            Pertinent Vitals/Pain   Pain Assessment Pain Assessment: Faces Faces Pain Scale: No hurt     Home Living   Living Arrangements: Spouse/significant other;Children       Home Equipment: Rolling Walker (2 wheels);Cane - single point        Prior Function        UE/LE Assessment               Communication   Communication Communication: No apparent difficulties Factors Affecting Communication: Non - English speaking, interpreter not available [place] [phone])     Cognition         General Comments      Exercises     Assessment/Plan    PT Problem List Decreased strength;Decreased activity tolerance;Decreased balance;Decreased mobility;Decreased knowledge of use of DME       PT Visit Diagnosis Repeated falls (R29.6);Unsteadiness on feet (R26.81);Other abnormalities of gait and mobility (R26.89)    No Skilled PT     Co-evaluation                AMPAC 6 Clicks Help needed turning from your back to your side while in a flat bed without using bedrails?: None Help needed moving from lying on your back to sitting on the side of a flat bed without using bedrails?: None Help needed moving to and from a bed to a chair (including a wheelchair)?: A Little Help needed standing up from a chair using your arms (e.g., wheelchair  or bedside chair)?: A Little Help needed to walk in hospital room?: A Little Help needed climbing 3-5 steps with a railing? : A Little 6 Click Score: 20      End of Session     Patient left: in bed;with call bell/phone within reach Nurse Communication: Mobility status PT Visit Diagnosis: Repeated falls (R29.6);Unsteadiness on feet (R26.81);Other abnormalities of gait and mobility (R26.89)     Time: 8597-8553 PT Time Calculation (min) (ACUTE ONLY): 44 min  Charges:   PT Evaluation $PT Eval Moderate Complexity: 1 Mod PT Treatments $Gait Training: 8-22 mins     03/11/2024  Ryan HERO., PT Acute Rehabilitation Services [phone]  (office)  Ryan Wells  03/11/2024, 3:41 PM

## 2024-03-12 ENCOUNTER — Inpatient Hospital Stay (HOSPITAL_COMMUNITY)
Admit: 2024-03-12 | Discharge: 2024-03-12 | Disposition: A | Discharge status: Home / Self Care | Attending: Cardiology | Admitting: Cardiology

## 2024-03-12 ENCOUNTER — Other Ambulatory Visit: Payer: Self-pay | Admitting: Student

## 2024-03-12 ENCOUNTER — Other Ambulatory Visit (HOSPITAL_COMMUNITY): Payer: Self-pay

## 2024-03-12 DIAGNOSIS — R55 Syncope and collapse: Secondary | ICD-10-CM

## 2024-03-12 DIAGNOSIS — E875 Hyperkalemia: Secondary | ICD-10-CM | POA: Diagnosis not present

## 2024-03-12 LAB — CBC
HCT: 26.9 % — ABNORMAL LOW (ref 39.0–52.0)
Hemoglobin: 8.8 g/dL — ABNORMAL LOW (ref 13.0–17.0)
MCH: 32 pg (ref 26.0–34.0)
MCHC: 32.7 g/dL (ref 30.0–36.0)
MCV: 97.8 fL (ref 80.0–100.0)
Platelets: 116 K/uL — ABNORMAL LOW (ref 150–400)
RBC: 2.75 MIL/uL — ABNORMAL LOW (ref 4.22–5.81)
RDW: 15.4 % (ref 11.5–15.5)
WBC: 6.7 K/uL (ref 4.0–10.5)
nRBC: 0 % (ref 0.0–0.2)

## 2024-03-12 LAB — RENAL FUNCTION PANEL
Albumin: 3.5 g/dL (ref 3.5–5.0)
Anion gap: 14 (ref 5–15)
BUN: 70 mg/dL — ABNORMAL HIGH (ref 8–23)
CO2: 26 mmol/L (ref 22–32)
Calcium: 8.6 mg/dL — ABNORMAL LOW (ref 8.9–10.3)
Chloride: 91 mmol/L — ABNORMAL LOW (ref 98–111)
Creatinine, Ser: 7.87 mg/dL — ABNORMAL HIGH (ref 0.61–1.24)
GFR, Estimated: 7 mL/min — ABNORMAL LOW (ref >60)
Glucose, Bld: 132 mg/dL — ABNORMAL HIGH (ref 70–99)
Phosphorus: 5.2 mg/dL — ABNORMAL HIGH (ref 2.5–4.6)
Potassium: 5.2 mmol/L — ABNORMAL HIGH (ref 3.5–5.1)
Sodium: 131 mmol/L — ABNORMAL LOW (ref 135–145)

## 2024-03-12 LAB — GLUCOSE, CAPILLARY
Glucose-Capillary: 114 mg/dL — ABNORMAL HIGH (ref 70–99)
Glucose-Capillary: 115 mg/dL — ABNORMAL HIGH (ref 70–99)

## 2024-03-12 LAB — PHOSPHORUS: Phosphorus: 5.3 mg/dL — ABNORMAL HIGH (ref 2.5–4.6)

## 2024-03-12 MED ORDER — LOKELMA 5 G PO PACK
5.0000 g | PACK | Freq: Every day | ORAL | 0 refills | Status: DC
  Filled 2024-03-12 (×2): qty 7, 7d supply, fill #0

## 2024-03-12 NOTE — Discharge Planning (Signed)
 [facility] Patient Discharge Orders - [facility] CL[facility]: [place]  Patient's name: Ryan Wells Admit/DC Dates: 03/08/2024 - 03/12/24  DISCHARGE DIAGNOSES: Hyperkalemia - now started on Lokelma  10g daily  A-flutter RVR - treated with MTP, then bradycardic episode - cardiology plans 30-d event monitor  HD ORDER CHANGES: Heparin  change: no EDW Change: no Bath Change: no  ANEMIA MANAGEMENT: Aranesp : Given: no  ESA dose for discharge: mircera 100 mcg IV q 2 weeks, to start on 8/4 IV Iron  dose at discharge: per protocol Transfusion: Given: no  BONE/MINERAL MEDICATIONS: Hectorol /Calcitriol  change: no Sensipar/Parsabiv change: no  ACCESS INTERVENTION/CHANGE: no Details: He is scheduled for f'gram on 03/25/24 -> make sure that he has transportation for this  RECENT LABS: Recent Labs  Lab 03/12/24 0855  HGB 8.8*  NA 131*  K 5.2*  CALCIUM  8.6*  PHOS 5.2*  ALBUMIN  3.5   IV ANTIBIOTICS: no Details:  OTHER ANTICOAGULATION: On Eliquis    OTHER/APPTS/LAB ORDERS: - Check K weekly, assure that was able to start Lokelma  10g daily  D/C Meds to be reconciled by nurse after every discharge.  Completed By: Izetta Boehringer, PA-C [facility] Pager [phone]   Reviewed by: MD:______ RN_______

## 2024-03-12 NOTE — Progress Notes (Signed)
 ZIO AT MONITOR SERIAL # [id number] applied at hospital  Dr. Kate to read.  Enrollment to not forward to [employer] via EMR and serial # is showing as a unregistered monitor.  Brian Leonardi ZIO representative at [facility] [phone] notified. Email sent with patient name, dob, mrn, Dr. , Dx.  Edit enrollment- Needs spanish instructions 28 DAY ZIO AT - 2 CONSECUTIVE 14 DAY ZIO AT MONITORS- PLEASE SHIP 2ND MONITOR FOR PATIENT TO APPLY ON 03/26/2024.

## 2024-03-12 NOTE — TOC Progression Note (Signed)
 Transition of Care [facility]) - Progression Note    Patient Details  Name: Ryan Wells MRN: [id number] Date of Birth: 1956/07/14  Transition of Care [facility]) CM/SW Contact  Graves-Bigelow, Erminio Deems, RN Phone Number: 03/12/2024, 10:42 AM  Clinical Narrative:  Case Manager received a consult for medication assistance for Lokelma . Case Manager has a free trial offer card- BIN: [id number], PCN: [id number]. GRP: [id number], ID: [id number]. Pharmacy to check to see if the free trial offer card can be utilized. Case Manager will speak with patient and interpreter post HD to see if the patient will benefit from [facility]. Case Manager to continue to follow.   Expected Discharge Plan: Home/Self Care Barriers to Discharge: Continued Medical Work up  Expected Discharge Plan and Services In-house Referral: NA Discharge Planning Services: CM Consult Post Acute Care Choice: NA Living arrangements for the past 2 months: Single Family Home                   DME Agency: NA  Social Drivers of Health (SDOH) Interventions SDOH Screenings   Food Insecurity: No Food Insecurity (03/08/2024)  Housing: Low Risk  (03/08/2024)  Transportation Needs: No Transportation Needs (03/08/2024)  Recent Concern: Transportation Needs - Unmet Transportation Needs (02/20/2024)  Utilities: Not At Risk (03/08/2024)  Financial Resource Strain: Medium Risk (09/23/2023)  Social Connections: Patient Declined (03/08/2024)  Recent Concern: Social Connections - Socially Isolated (02/20/2024)  Tobacco Use: Medium Risk (03/08/2024)   Readmission Risk Interventions    03/10/2024    3:42 PM 02/23/2024    4:45 PM  Readmission Risk Prevention Plan  Transportation Screening Complete Complete  Home Care Screening  Complete  Medication Review (RN CM)  Complete  HRI or Home Care Consult Complete   Social Work Consult for Recovery Care Planning/Counseling Complete   Palliative Care Screening Not Applicable   Medication Review Ocean[employer])  Referral to Pharmacy

## 2024-03-12 NOTE — Progress Notes (Signed)
   PT currently getting dialysis  Comfortable in bed Tele with SB  As noted yesterday will make plans for monitor and sleep study Will arrange for follow up  SIgn off   Please call with questions.     Signed, Vina Gull, MD  03/12/2024, 9:13 AM

## 2024-03-12 NOTE — Discharge Summary (Signed)
 [facility]  Physician Discharge Summary   Patient ID: Ryan Wells MRN: [id number] DOB/AGE: 69-10-1955 69 y.o.  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 Back pain with chronic changes on imaging studies Hypothyroidism Chronic systolic CHF Diabetes mellitus type 2 with hyperlipidemia Anemia of chronic disease Chronic thrombocytopenia    RECOMMENDATIONS FOR OUTPATIENT FOLLOW UP: Outpatient dialysis as per previous schedule   Home Health: PT Equipment/Devices: None  CODE STATUS: Full code  DISCHARGE CONDITION: fair  Diet recommendation: Renal diet  INITIAL HISTORY: Patient is a Hispanic male with past medical history significant for ESRD on hemodialysis Monday Wednesday Friday presented to the hospital for generalized weakness and transient lower extremity weakness to the point where he could not walk and did not go for dialysis. Last dialysis session was Friday on 7/25. Initial labs showed hyperkalemia of a potassium of 7.8. Nephrology was consulted and recommended the patient be admitted to the ICU for dialysis session so PCCM was consulted and he was admitted to the intensive care unit and transferred to [facility] service on 03/09/2024.    Consultants: Nephrology.  Cardiology   Procedures: Hemodialysis  HOSPITAL COURSE:   ESRD on HD MWF Metabolic Bone Disease Elevated AG Metabolic Acidosis  Hyperkalemia Patient admitted with potassium level greater than 7.5.  Was urgently hemodialyzed. Potassium levels have stabilized. Patient was seen by nephrology.  They recommend Lokelma  on daily basis if possible.  However the medication is too expensive for the patient.  We are trying to provide assistance but this may not be possible. He was asked to stay compliant with his dialysis schedule.    Atypical atrial flutter/SVT Seen by cardiology during previous hospitalization.   Patient was noted to be in RVR with hemodialysis.  Was given 2.5 mg of metoprolol  intravenously.  Heart rate improved.  Subsequently became bradycardic perhaps a brief moment of unresponsiveness.  Stabilized quickly without any intervention.  Has been stable through the night.  Heart rate occasionally in the 40s. Seen by cardiology.  They plan to apply Zio patch.  No further interventions at this time.  No AV blocking agents. Continue Eliquis . Echocardiogram from earlier this month showed LVEF of 50%.   Back pain:  CT scan of the lumbar spine suggested discitis.  He was hospitalized in 2022 for discitis.   Underwent MRI of the lumbar spine which showed L5-S1 changes which are chronic.  No urgent indication to involve neurosurgery at this time.  This can be pursued in the outpatient setting.   Patient's presentation with weakness and back pain might have been due to his electrolyte imbalance rather than his spinal issues.  He absolutely denies any back pain this morning and is able to move his legs without any evidence for weakness.  No further workup at this time.     Hypothyroidism Continue levothyroxine . Noted to have a TSH of 9.7 on 7/12 and a free T4 of 0.95 on 7/13.   Chronic Systolic CHF: CAD History: Echocardiogram from earlier this month showed LVEF of 50%.  Diabetes Mellitus Type 2 w/ Hyperlipidemia:    Anemia of Chronic Disease (ESRD):  Hemoglobin noted to be low but stable for the most part.  No evidence of overt bleeding.     Thrombocytopenia: Chronic;  Stable counts noted.   Patient is stable.  Okay for discharge after hemodialysis today.  PERTINENT LABS:  The results of significant diagnostics from this hospitalization (including imaging, microbiology, ancillary and laboratory) are listed below for reference.    Microbiology: Recent Results (from the past 240 hours)  MRSA Next Gen  by PCR, Nasal     Status: None   Collection Time: 03/08/24  2:50 PM   Specimen: Nasal Mucosa; Nasal Swab  Result Value Ref Range Status   MRSA by PCR Next Gen NOT DETECTED NOT DETECTED Final    Comment: (NOTE) The GeneXpert MRSA Assay (FDA approved for NASAL specimens only), is one component of a comprehensive MRSA colonization surveillance program. It is not intended to diagnose MRSA infection nor to guide or monitor treatment for MRSA infections. Test performance is not FDA approved in patients less than 7 years old. Performed at [facility], [address], [place], [place] [address]      Labs:   Basic Metabolic Panel: Recent Labs  Lab 03/08/24 1221 03/08/24 1234 03/08/24 1559 03/09/24 0244 03/10/24 0323 03/10/24 9161 03/10/24 1955 03/11/24 0433 03/11/24 0842 03/11/24 1332 03/12/24 0855  NA 135   < > 135 135 131*  --  133*  --   --   --  131*  K >7.5*   < > 6.5* 4.5 6.3*   < > 4.9 5.1 5.2* 5.0 5.2*  CL 94*   < > 96* 93* 93*  --  93*  --   --   --  91*  CO2 24  --  22 28 26   --  30  --   --   --  26  GLUCOSE 125*   < > 117* 154* 114*  --  174*  --   --   --  132*  BUN 65*   < > 65* 27* 56*  --  32*  --   --   --  70*  CREATININE 8.52*   < > 8.40* 4.48* 6.86*  --  4.56*  --   --   --  7.87*  CALCIUM  9.1  --  9.5 9.0 8.7*  --  8.9  --   --   --  8.6*  MG 2.6*  --   --   --  2.4  --   --   --   --   --   --   PHOS  --   --   --   --  5.1*  --   --   --   --   --  5.2*   < > = values in this interval not displayed.   Liver Function Tests: Recent Labs  Lab 03/08/24 1221 03/10/24 0323 03/12/24 0855  AST 27 15  --   ALT 26 19  --   ALKPHOS 137* 105  --   BILITOT 1.3* 1.0  --   PROT 7.4 6.8  --   ALBUMIN  4.0 3.6 3.5    CBC: Recent Labs  Lab 03/08/24 1221 03/08/24 1234 03/09/24 0244 03/10/24 0323 03/12/24 0855  WBC 8.3  --  6.7 9.0 6.7  NEUTROABS 5.5  --   --  6.6  --   HGB 10.7* 10.9* 8.7* 9.1* 8.8*  HCT 34.0* 32.0* 26.8* 28.7* 26.9*  MCV  100.3*  --  97.8 100.0 97.8  PLT 139*  --  109* 120* 116*    BNP: BNP (last 3 results) Recent Labs    03/08/24 1235  BNP 1,227.1*    CBG: Recent Labs  Lab 03/11/24 0754 03/11/24  1203 03/11/24 1610 03/11/24 2106 03/12/24 0744  GLUCAP 119* 115* 151* 157* 114*     IMAGING STUDIES MR LUMBAR SPINE W WO CONTRAST Result Date: 03/10/2024 CLINICAL DATA:  Lumbar radiculopathy, infection suspected, positive xray/CT EXAM: MRI LUMBAR SPINE WITHOUT AND WITH CONTRAST TECHNIQUE: Multiplanar and multiecho pulse sequences of the lumbar spine were obtained without and with intravenous contrast. CONTRAST:  8mL GADAVIST  GADOBUTROL  1 MMOL/ML IV SOLN COMPARISON:  MRI lumbar spine 03/16/2021. FINDINGS: Segmentation:  Standard. Alignment:  No substantial sagittal subluxation Vertebrae: Comparison 10/01/2020, interval disc collapse and progressive endplate erosive change, compatible with chronic osteomyelitis. Adjacent endplate edema and enhancement, which does appear proved since 2022. Adjacent L5 and S1 endplate edema is persistent but improved. Conus medullaris and cauda equina: Conus extends to the L2-L3 level. Conus and cauda equina appear normal. Paraspinal and other soft tissues: Equivocal trace paraspinal edema along the ventral sacral vertebral bodies. No abscess. Disc levels: T12-L1: No significant disc protrusion, foraminal stenosis, or canal stenosis. L1-L2: No significant disc protrusion, foraminal stenosis, or canal stenosis. L2-L3: Slight left eccentric disc bulging.  No significant stenosis. L3-L4: Mild disc bulging. Bilateral facet arthropathy. Resulting mild left foraminal stenosis. Patent canal and right foramen. L4-L5: Broad disc bulge with moderate bilateral facet arthropathy. Resulting moderate bilateral foraminal stenosis. Patent canal. L5-S1: Findings of chronic discitis/osteomyelitis detailed above. Severe bilateral facet arthropathy. Resulting severe bilateral foraminal stenosis.  IMPRESSION: 1. Findings compatible with chronic L5-S1 discitis/osteomyelitis. Disc edema is improved since 2022, but there has been interval disc collapse and progressive endplate erosive change. There is adjacent endplate marrow edema and enhancement, also improved since 2022. Equivocal trace paraspinal edema without abscess. 2. At L5-S1, severe bilateral foraminal stenosis. 3. At L4-L5, moderate bilateral foraminal stenosis. Electronically Signed   By: Gilmore GORMAN Molt M.D.   On: 03/10/2024 02:13   CT LUMBAR SPINE WO CONTRAST Result Date: 03/08/2024 CLINICAL DATA:  Lower back pain, cauda equina syndrome suspected. Transient bilateral lower extremity weakness. EXAM: CT LUMBAR SPINE WITHOUT CONTRAST TECHNIQUE: Multidetector CT imaging of the lumbar spine was performed without intravenous contrast administration. Multiplanar CT image reconstructions were also generated. RADIATION DOSE REDUCTION: This exam was performed according to the departmental dose-optimization program which includes automated exposure control, adjustment of the mA and/or kV according to patient size and/or use of iterative reconstruction technique. COMPARISON:  CT pelvis 03/17/2021, MRI lumbar spine 03/16/2021. FINDINGS: Segmentation: 5 lumbar type vertebrae. Alignment: Lumbar lordosis is maintained.  No listhesis. Vertebrae: There is irregularity and sclerosis of the L5 inferior endplate and S1 superior endplate which is progressed since the 2022 CT. There is increased irregularity of the L5 inferior endplate with areas of fragmentation and erosive change. There is vacuum disc phenomena noted within the adjacent disc space. Vertebral body heights are otherwise maintained. No additional osseous lesion. Degenerative changes and ankylosis of the sacroiliac joints. Paraspinal and other soft tissues: The paraspinal musculature is unremarkable. Mild scattered atherosclerosis of the abdominal aorta and branch vessels. Atrophic appearance of the  bilateral native kidneys. There is likely cholelithiasis. Ascites noted in the pelvis with additional small in fluid more superiorly within the abdomen. Disc levels: T12-L1: No significant disc bulge. No significant spinal canal stenosis or foraminal stenosis. L1-2: Small disc bulge. Mild facet arthrosis. No significant spinal canal stenosis. Mild foraminal stenosis on the right L2-3: Vacuum disc phenomenon. Diffuse disc bulge. Bilateral facet arthrosis slightly greater on the right. No significant spinal canal stenosis. Mild foraminal stenosis on the right. L3-4: Diffuse disc bulge. Bilateral facet  arthrosis and thickening of the ligamentum flavum. Mild spinal canal stenosis. Mild bilateral foraminal stenosis slightly greater on the left. L4-5: Diffuse disc bulge. Severe facet arthrosis. Thickening of the ligamentum flavum. Mild spinal canal stenosis. There is moderate bilateral foraminal stenosis. L5-S1: Endplate erosive changes as above. Small disc bulge and posterior osteophytes. Severe facet arthrosis. There is erosive change of the bilateral facets more pronounced on the left. Mild spinal canal stenosis. Moderate to severe right and moderate left foraminal stenosis. IMPRESSION: Interval progression of irregularity and sclerosis of the L5-S1 endplates. Findings likely reflect sequelae of discitis/osteomyelitis. Recommend correlation with MRI lumbar spine with and without contrast for further evaluation. Degenerative changes as above. Foraminal stenosis most pronounced on the right at L5-S1. Electronically Signed   By: Donnice Mania M.D.   On: 03/08/2024 15:01   DG Chest Portable 1 View Result Date: 03/08/2024 CLINICAL DATA:  Near syncope. EXAM: PORTABLE CHEST 1 VIEW COMPARISON:  February 20, 2024 FINDINGS: The cardiac silhouette is enlarged and unchanged in size. Low lung volumes are noted with multiple overlying cardiac lead wires and defibrillator leads. No acute infiltrate, pleural effusion or pneumothorax is  identified. Postoperative changes are present within the lower cervical spine. No acute osseous abnormalities are seen. IMPRESSION: Low lung volumes without acute or active cardiopulmonary disease. Electronically Signed   By: Suzen Dials M.D.   On: 03/08/2024 13:35   ECHOCARDIOGRAM LIMITED Result Date: 02/22/2024    ECHOCARDIOGRAM LIMITED REPORT   Patient Name:   Ryan Wells Date of Exam: 02/22/2024 Medical Rec #:  [id number]            Height:       62.0 in Accession #:    [id number]           Weight:       143.7 lb Date of Birth:  May 10, 1956             BSA:          1.661 m Patient Age:    68 years             BP:           144/64 mmHg Patient Gender: M                    HR:           53 bpm. Exam Location:  Inpatient Procedure: Limited Echo, Cardiac Doppler and Color Doppler (Both Spectral and            Color Flow Doppler were utilized during procedure). Indications:    atrial flutter  History:        Patient has prior history of Echocardiogram examinations, most                 recent 02/20/2024. Risk Factors:Hypertension, Diabetes and                 Dyslipidemia.  Sonographer:    Philomena Daring Referring Phys: GAYATRI A ACHARYA IMPRESSIONS  1. Left ventricular ejection fraction, by estimation, is 50%. The left ventricle has low normal function. Left ventricular endocardial border not optimally defined to evaluate regional wall motion. There is mild left ventricular hypertrophy.  2. Right ventricular systolic function is normal. The right ventricular size is mildly enlarged. There is mildly elevated pulmonary artery systolic pressure. The estimated right ventricular systolic pressure is 37.5 mmHg.  3. Left atrial size was mildly dilated.  4. Right atrial size was mildly  dilated.  5. The mitral valve is degenerative. Mild mitral valve regurgitation.  6. The aortic valve is tricuspid. There is mild calcification of the aortic valve. There is mild thickening of the aortic valve.  7. The inferior  vena cava is dilated in size with <50% respiratory variability, suggesting right atrial pressure of 15 mmHg. FINDINGS  Left Ventricle: Left ventricular ejection fraction, by estimation, is 50%. The left ventricle has low normal function. Left ventricular endocardial border not optimally defined to evaluate regional wall motion. There is mild left ventricular hypertrophy. Right Ventricle: The right ventricular size is mildly enlarged. Right ventricular systolic function is normal. There is mildly elevated pulmonary artery systolic pressure. The tricuspid regurgitant velocity is 2.37 m/s, and with an assumed right atrial pressure of 15 mmHg, the estimated right ventricular systolic pressure is 37.5 mmHg. Left Atrium: Left atrial size was mildly dilated. Right Atrium: Right atrial size was mildly dilated. Pericardium: Trivial pericardial effusion is present. Mitral Valve: The mitral valve is degenerative in appearance. Mild mitral valve regurgitation. Tricuspid Valve: The tricuspid valve is grossly normal. Tricuspid valve regurgitation is trivial. Aortic Valve: The aortic valve is tricuspid. There is mild calcification of the aortic valve. There is mild thickening of the aortic valve. Venous: The inferior vena cava is dilated in size with less than 50% respiratory variability, suggesting right atrial pressure of 15 mmHg. LEFT VENTRICLE PLAX 2D LVIDd:         4.90 cm LVIDs:         3.40 cm LV PW:         1.00 cm LV IVS:        1.10 cm LVOT diam:     1.80 cm LVOT Area:     2.54 cm  IVC IVC diam: 2.50 cm LEFT ATRIUM         Index LA diam:    4.00 cm 2.41 cm/m  TRICUSPID VALVE TR Peak grad:   22.5 mmHg TR Vmax:        237.00 cm/s  SHUNTS Systemic Diam: 1.80 cm Soyla Merck MD Electronically signed by Soyla Merck MD Signature Date/Time: 02/22/2024/5:28:10 PM    Final    ECHOCARDIOGRAM COMPLETE Result Date: 02/20/2024    ECHOCARDIOGRAM REPORT   Patient Name:   Ryan Wells Date of Exam: 02/20/2024 Medical  Rec #:  [id number]            Height:       62.0 in Accession #:    [id number]           Weight:       150.6 lb Date of Birth:  [birth year]             BSA:          1.694 m Patient Age:    68 years             BP:           139/98 mmHg Patient Gender: M                    HR:           142 bpm. Exam Location:  Inpatient Procedure: 2D Echo, Cardiac Doppler and Color Doppler (Both Spectral and Color            Flow Doppler were utilized during procedure). Indications:    Atrial Flutter I48.92  History:        Patient has prior history of Echocardiogram examinations,  most                 recent 01/16/2021. CKD, stage 4; Risk Factors:Hypertension,                 Diabetes and Dyslipidemia.  Sonographer:    Thea Norlander RCS Referring Phys: WADDELL LABOR PARCELLS IMPRESSIONS  1. Left ventricular ejection fraction, by estimation, is 35 to 40%. The left ventricle has moderately decreased function. The left ventricle demonstrates global hypokinesis. There is mild left ventricular hypertrophy. Left ventricular diastolic parameters are indeterminate.  2. Right ventricular systolic function is mildly reduced. The right ventricular size is normal. There is moderately elevated pulmonary artery systolic pressure. The estimated right ventricular systolic pressure is 48.4 mmHg.  3. The mitral valve is degenerative. Trivial mitral valve regurgitation.  4. Tricuspid valve regurgitation is moderate.  5. The aortic valve is tricuspid. Aortic valve regurgitation is not visualized. Aortic valve sclerosis/calcification is present, without any evidence of aortic stenosis.  6. The inferior vena cava is dilated in size with <50% respiratory variability, suggesting right atrial pressure of 15 mmHg. Conclusion(s)/Recommendation(s): Technically difficult study, patient tachycardic to 140s. Would consider repeat limited echo with contrast once heart rate improved. FINDINGS  Left Ventricle: Left ventricular ejection fraction, by estimation, is 35 to  40%. The left ventricle has moderately decreased function. The left ventricle demonstrates global hypokinesis. The left ventricular internal cavity size was normal in size. There is mild left ventricular hypertrophy. Left ventricular diastolic parameters are indeterminate. Right Ventricle: The right ventricular size is normal. No increase in right ventricular wall thickness. Right ventricular systolic function is mildly reduced. There is moderately elevated pulmonary artery systolic pressure. The tricuspid regurgitant velocity is 2.89 m/s, and with an assumed right atrial pressure of 15 mmHg, the estimated right ventricular systolic pressure is 48.4 mmHg. Left Atrium: Left atrial size was normal in size. Right Atrium: Right atrial size was normal in size. Pericardium: There is no evidence of pericardial effusion. Mitral Valve: The mitral valve is degenerative in appearance. Mild to moderate mitral annular calcification. Trivial mitral valve regurgitation. Tricuspid Valve: The tricuspid valve is normal in structure. Tricuspid valve regurgitation is moderate. Aortic Valve: The aortic valve is tricuspid. Aortic valve regurgitation is not visualized. Aortic valve sclerosis/calcification is present, without any evidence of aortic stenosis. Aortic valve peak gradient measures 5.1 mmHg. Pulmonic Valve: The pulmonic valve was grossly normal. Pulmonic valve regurgitation is trivial. Aorta: The aortic root and ascending aorta are structurally normal, with no evidence of dilitation. Venous: The inferior vena cava is dilated in size with less than 50% respiratory variability, suggesting right atrial pressure of 15 mmHg. IAS/Shunts: The interatrial septum was not well visualized.  LEFT VENTRICLE PLAX 2D LVIDd:         4.70 cm LVIDs:         3.40 cm LV PW:         1.00 cm LV IVS:        0.90 cm LVOT diam:     2.00 cm LV SV:         36 LV SV Index:   21 LVOT Area:     3.14 cm  RIGHT VENTRICLE            IVC RV S prime:     9.25  cm/s  IVC diam: 2.40 cm TAPSE (M-mode): 1.1 cm LEFT ATRIUM             Index  RIGHT ATRIUM           Index LA diam:        4.50 cm 2.66 cm/m   RA Area:     15.70 cm LA Vol (A2C):   58.7 ml 34.64 ml/m  RA Volume:   38.20 ml  22.54 ml/m LA Vol (A4C):   49.1 ml 28.98 ml/m LA Biplane Vol: 53.5 ml 31.57 ml/m  AORTIC VALVE AV Area (Vmax): 2.28 cm AV Vmax:        113.00 cm/s AV Peak Grad:   5.1 mmHg LVOT Vmax:      82.00 cm/s LVOT Vmean:     55.550 cm/s LVOT VTI:       0.114 m  AORTA Ao Root diam: 3.50 cm Ao Asc diam:  3.60 cm TRICUSPID VALVE TR Peak grad:   33.4 mmHg TR Vmax:        289.00 cm/s  SHUNTS Systemic VTI:  0.11 m Systemic Diam: 2.00 cm Lonni Nanas MD Electronically signed by Lonni Nanas MD Signature Date/Time: 02/20/2024/6:17:36 PM    Final    DG Chest Port 1 View Result Date: 02/20/2024 CLINICAL DATA:  Shortness of breath. EXAM: PORTABLE CHEST 1 VIEW COMPARISON:  March 14, 2021. FINDINGS: Stable cardiomegaly. Both lungs are clear. The visualized skeletal structures are unremarkable. IMPRESSION: No active disease. Electronically Signed   By: Lynwood Landy Raddle M.D.   On: 02/20/2024 14:31    DISCHARGE EXAMINATION: Vitals:   03/12/24 0900 03/12/24 0930 03/12/24 1000 03/12/24 1030  BP: (!) 151/53 (!) 152/60 (!) 154/62 (!) 140/65  Pulse: 64 64 63 64  Resp: 14 12 18 14   Temp:      TempSrc:      SpO2: 100% 100% 100% 100%  Weight:      Height:       General appearance: Awake alert.  In no distress Resp: Clear to auscultation bilaterally.  Normal effort Cardio: S1-S2 is normal regular.  No S3-S4.  No rubs murmurs or bruit GI: Abdomen is soft.  Nontender nondistended.  Bowel sounds are present normal.  No masses organomegaly Extremities: No edema.  Full range of motion of lower extremities. Neurologic: Alert and oriented x3.  No focal neurological deficits.    DISPOSITION: Home  Discharge Instructions     Call MD for:  difficulty breathing, headache or visual  disturbances   Complete by: As directed    Call MD for:  extreme fatigue   Complete by: As directed    Call MD for:  persistant dizziness or light-headedness   Complete by: As directed    Call MD for:  persistant nausea and vomiting   Complete by: As directed    Call MD for:  severe uncontrolled pain   Complete by: As directed    Call MD for:  temperature >100.4   Complete by: As directed    Discharge instructions   Complete by: As directed    Please take your medications as prescribed.  Please keep up with your dialysis schedule.  You were cared for by a hospitalist during your hospital stay. If you have any questions about your discharge medications or the care you received while you were in the hospital after you are discharged, you can call the unit and asked to speak with the hospitalist on call if the hospitalist that took care of you is not available. Once you are discharged, your primary care physician will handle any further medical issues. Please note that NO REFILLS for  any discharge medications will be authorized once you are discharged, as it is imperative that you return to your primary care physician (or establish a relationship with a primary care physician if you do not have one) for your aftercare needs so that they can reassess your need for medications and monitor your lab values. If you do not have a primary care physician, you can call [phone] for a physician referral.   Increase activity slowly   Complete by: As directed          Allergies as of 03/12/2024   No Known Allergies      Medication List     TAKE these medications    acetaminophen  325 MG tablet Commonly known as: TYLENOL  Take 2 tablets (650 mg total) by mouth every 6 (six) hours as needed for mild pain (or Fever >/= 101).   atorvastatin  20 MG tablet Commonly known as: LIPITOR TAKE 1 TABLET BY MOUTH DAILY WITH THE EVENING MEAL What changed: See the new instructions.   Dialyvite 800 0.8 MG  Tabs Take 1 tablet by mouth at bedtime.   Eliquis  5 MG Tabs tablet Generic drug: apixaban  Take 1 tablet (5 mg total) by mouth 2 (two) times daily.   levothyroxine  88 MCG tablet Commonly known as: SYNTHROID  TAKE 1 TABLET(88 MCG) BY MOUTH DAILY What changed: See the new instructions.   Lokelma  5 g packet Generic drug: sodium zirconium cyclosilicate  Take 5 g by mouth daily.   sevelamer  carbonate 800 MG tablet Commonly known as: RENVELA  Take 1,600 mg by mouth with breakfast, with lunch, and with evening meal.          Follow-up Information     Adella Norris, MD. Schedule an appointment as soon as possible for a visit in 1 week(s).   Specialty: Internal Medicine Why: post hospitalization follow up Contact information: [address] [place] [address] [phone]                 TOTAL DISCHARGE TIME: 35 minutes  Rosilyn Coachman Verdene  [facility] Pager on [email or web address]  03/12/2024, 10:36 AM

## 2024-03-12 NOTE — Progress Notes (Signed)
 [facility] Progress Note   Subjective:   Seen on HD - 2L UFG and tolerating. Denies CP/dyspnea. Appears to have small stitch in AVF - d/w RN to remove after HD today. For discharge later today per notes.  Objective Vitals:   03/12/24 0900 03/12/24 0930 03/12/24 1000 03/12/24 1030  BP: (!) 151/53 (!) 152/60 (!) 154/62 (!) 140/65  Pulse: 64 64 63 64  Resp: 14 12 18 14   Temp:      TempSrc:      SpO2: 100% 100% 100% 100%  Weight:      Height:       Physical Exam General: Well appearing man, NAD. Room air Heart: RRR; no murmur Lungs: CTA anteriorly Abdomen: soft Extremities: no LE edema Dialysis Access: LUE AVF +t/b, small stitch present, small scabs present  Additional Objective Labs: Basic Metabolic Panel: Recent Labs  Lab 03/10/24 0323 03/10/24 0838 03/10/24 1955 03/11/24 0433 03/11/24 0842 03/11/24 1332 03/12/24 0500 03/12/24 0855  NA 131*  --  133*  --   --   --   --  131*  K 6.3*   < > 4.9   < > 5.2* 5.0  --  5.2*  CL 93*  --  93*  --   --   --   --  91*  CO2 26  --  30  --   --   --   --  26  GLUCOSE 114*  --  174*  --   --   --   --  132*  BUN 56*  --  32*  --   --   --   --  70*  CREATININE 6.86*  --  4.56*  --   --   --   --  7.87*  CALCIUM  8.7*  --  8.9  --   --   --   --  8.6*  PHOS 5.1*  --   --   --   --   --  5.3* 5.2*   < > = values in this interval not displayed.   Liver Function Tests: Recent Labs  Lab 03/08/24 1221 03/10/24 0323 03/12/24 0855  AST 27 15  --   ALT 26 19  --   ALKPHOS 137* 105  --   BILITOT 1.3* 1.0  --   PROT 7.4 6.8  --   ALBUMIN  4.0 3.6 3.5   CBC: Recent Labs  Lab 03/08/24 1221 03/08/24 1234 03/09/24 0244 03/10/24 0323 03/12/24 0855  WBC 8.3  --  6.7 9.0 6.7  NEUTROABS 5.5  --   --  6.6  --   HGB 10.7*   < > 8.7* 9.1* 8.8*  HCT 34.0*   < > 26.8* 28.7* 26.9*  MCV 100.3*  --  97.8 100.0 97.8  PLT 139*  --  109* 120* 116*   < > = values in this interval not displayed.   Medications:    apixaban   5 mg Oral BID   Chlorhexidine  Gluconate Cloth  6 each Topical Q0600   [START ON 03/18/2024] darbepoetin (ARANESP ) injection - DIALYSIS  60 mcg Subcutaneous Q Thu-1800   insulin  aspart  0-5 Units Subcutaneous QHS   insulin  aspart  0-6 Units Subcutaneous TID WC   levothyroxine   88 mcg Oral Q0600   sevelamer  carbonate  1,600 mg Oral TID with meals   sodium zirconium cyclosilicate   10 g Oral QODAY    Dialysis Orders MWF - [facility] 3:45, 400/A1.5, EDW  66.7kg, 2K/2Ca Mircera 30 mcg - last given on 02/27/24; Dose raised to 75mcg Q4 wks Calcitriol  2mcg PO qHD - last dose 03/05/24   Assessment/Plan: Generalized weakness: Due to hyperkalemia, better. ESRD: Continue HD on MWF schedule - on HD now.  Hyperkalemia: S/p HD on 7/28 for severe hyperkalemia. Now on Lokelma  on non-HD days. Possibly related to AVF dysfunction/?recirculation - had spontaneous bleed on 7/18 requiring stitch to be placed, was supposed to have f'gram arranged soon after - looks like arranged for 03/25/24. Arrhthymias: Bradycardia with hyperK, then with A-fib on 7/30. Will be going home with event monitor and will have sleep study arranged.  Hypertension/volume: BP stable, min LE edema - UF as tolerated. Anemia of CKD: Hgb 8.8 - continue Aranesp  here and ^ dose as outpatient. Secondary Hyperparathyroidism: Phos up slightly, continue sevelamer . Nutrition: Alb ok, continue renal diet.   Izetta Boehringer, PA-C 03/12/2024, 10:56 AM  [employer]

## 2024-03-12 NOTE — TOC Transition Note (Signed)
 Transition of Care [facility]) - Discharge Note   Patient Details  Name: Ryan Wells MRN: [id number] Date of Birth: [birth year]  [facility]) CM/SW Contact:  Sudie Erminio Deems, RN Phone Number: 03/12/2024, 12:25 PM   Clinical Narrative:  Patient plans to transition home today and is agreeable to home health services. Case Manager used the video interpreter and spoke with Jorie [phone] to interpret for the patient. Case Manager discussed with patient regarding Home Health PT and he is agreeable to [facility] PT no agency preference-referral submitted to [facility] and start of care to begin within 24-48 hours post discharge. Patient states his ride will be here after 1300. No further needs identified at this time.   Final next level of care: Home w Home Health Services Barriers to Discharge: No Barriers Identified   Patient Goals and CMS Choice Patient states their goals for this hospitalization and ongoing recovery are:: Plan to return home with spouse   Choice offered to / list presented to : NA  Discharge Plan and Services Additional resources added to the After Visit Summary for   In-house Referral: NA Discharge Planning Services: CM Consult Post Acute Care Choice: NA            DME Agency: NA       HH Arranged: PT HH Agency: [facility] Date [facility] Agency Contacted: 03/12/24 Time HH Agency Contacted: 1225 Representative spoke with at [facility] Agency: Darleene  Social Drivers of Health (SDOH) Interventions SDOH Screenings   Food Insecurity: No Food Insecurity (03/08/2024)  Housing: Low Risk  (03/08/2024)  Transportation Needs: No Transportation Needs (03/08/2024)  Recent Concern: Transportation Needs - Unmet Transportation Needs (02/20/2024)  Utilities: Not At Risk (03/08/2024)  Financial Resource Strain: Medium Risk (09/23/2023)  Social Connections: Patient Declined (03/08/2024)  Recent Concern: Social Connections - Socially Isolated (02/20/2024)  Tobacco Use:  Medium Risk (03/08/2024)    Readmission Risk Interventions    03/10/2024    3:42 PM 02/23/2024    4:45 PM  Readmission Risk Prevention Plan  Transportation Screening Complete Complete  Home Care Screening  Complete  Medication Review (RN CM)  Complete  HRI or Home Care Consult Complete   Social Work Consult for Recovery Care Planning/Counseling Complete   Palliative Care Screening Not Applicable   Medication Review Oceanographer) Referral to Pharmacy

## 2024-03-12 NOTE — Plan of Care (Signed)

## 2024-03-12 NOTE — Progress Notes (Signed)
 Ordered 2 live 14 day Zio monitors (total of 28 days) for syncopy per Dr Okey. Dr Kate to read.

## 2024-03-12 NOTE — Plan of Care (Signed)
  Problem: Education: Goal: Knowledge of General Education information will improve Description: Including pain rating scale, medication(s)/side effects and non-pharmacologic comfort measures Outcome: Adequate for Discharge   Problem: Health Behavior/Discharge Planning: Goal: Ability to manage health-related needs will improve Outcome: Adequate for Discharge   Problem: Clinical Measurements: Goal: Ability to maintain clinical measurements within normal limits will improve Outcome: Adequate for Discharge Goal: Will remain free from infection Outcome: Adequate for Discharge Goal: Diagnostic test results will improve Outcome: Adequate for Discharge Goal: Respiratory complications will improve Outcome: Adequate for Discharge Goal: Cardiovascular complication will be avoided Outcome: Adequate for Discharge   Problem: Activity: Goal: Risk for activity intolerance will decrease Outcome: Adequate for Discharge   Problem: Nutrition: Goal: Adequate nutrition will be maintained Outcome: Adequate for Discharge   Problem: Coping: Goal: Level of anxiety will decrease Outcome: Adequate for Discharge   Problem: Pain Managment: Goal: General experience of comfort will improve and/or be controlled Outcome: Adequate for Discharge   Problem: Safety: Goal: Ability to remain free from injury will improve Outcome: Adequate for Discharge   Problem: Skin Integrity: Goal: Risk for impaired skin integrity will decrease Outcome: Adequate for Discharge   Problem: Education: Goal: Ability to describe self-care measures that may prevent or decrease complications (Diabetes Survival Skills Education) will improve Outcome: Adequate for Discharge Goal: Individualized Educational Video(s) Outcome: Adequate for Discharge   Problem: Coping: Goal: Ability to adjust to condition or change in health will improve Outcome: Adequate for Discharge   Problem: Fluid Volume: Goal: Ability to maintain a  balanced intake and output will improve Outcome: Adequate for Discharge   Problem: Health Behavior/Discharge Planning: Goal: Ability to identify and utilize available resources and services will improve Outcome: Adequate for Discharge Goal: Ability to manage health-related needs will improve Outcome: Adequate for Discharge   Problem: Metabolic: Goal: Ability to maintain appropriate glucose levels will improve Outcome: Adequate for Discharge   Problem: Nutritional: Goal: Maintenance of adequate nutrition will improve Outcome: Adequate for Discharge Goal: Progress toward achieving an optimal weight will improve Outcome: Adequate for Discharge   Problem: Skin Integrity: Goal: Risk for impaired skin integrity will decrease Outcome: Adequate for Discharge   Problem: Tissue Perfusion: Goal: Adequacy of tissue perfusion will improve Outcome: Adequate for Discharge

## 2024-03-12 NOTE — Progress Notes (Addendum)
 D/c order noted. Contacted HD clinic to be advised of pt arrival next Monday.    Obie Kallenbach Dialysis navigator [phone]

## 2024-03-12 NOTE — Progress Notes (Signed)
   03/12/24 1145  Vitals  Temp 97.9 F (36.6 C)  Pulse Rate 62  Resp 15  BP (!) 155/69  SpO2 100 %  Post Treatment  Dialyzer Clearance Clear  Hemodialysis Intake (mL) 0 mL  Liters Processed 69.9  Fluid Removed (mL) 2000 mL  Tolerated HD Treatment Yes  AVG/AVF Arterial Site Held (minutes) 8 minutes  AVG/AVF Venous Site Held (minutes) 8 minutes   Received patient in bed to unit.  Alert and oriented.  Informed consent signed and in chart.   TX duration:3HRS  Patient tolerated well.  Transported back to the room  Alert, without acute distress.  Hand-off given to patient's nurse.   Access used: LAVF Access issues: NONE  Total UF removed: 2L Medication(s) given: NONE    [facility]

## 2024-03-13 LAB — GLUCOSE, CAPILLARY: Glucose-Capillary: 193 mg/dL — ABNORMAL HIGH (ref 70–99)

## 2024-03-14 ENCOUNTER — Telehealth: Payer: Self-pay | Admitting: Cardiology

## 2024-03-14 NOTE — Telephone Encounter (Signed)
 Telephone outpatient note  Patient ID: Ryan Wells MRN: [id number]; DOB: 1955-08-29 [facility] Providers Cardiologist:  Ryan LITTIE Nanas, MD        Ryan Wells is a 68 y.o. male with ESRD (on HD MWF). Patient was recently found to have atypical AFL/SVT. During HD he was noted to be in RVR and was given metoprolol  2.5mg  IV with subsequent bradycardia and a moment of unresponsiveness that improved without intervention. HR ocassionaly in the 40s. He was evaluated by cardiology and was recommended to discontinue AV nodal blocking agents and plan for a ziopatch.  Called [employer] back at 2015. No answer received after waiting 6.5 minutes on hold.  Received second page, called back again at 2026.  Patient had a 6.5 second pause with an underlying rhythm at 6:20pm. Patient felt weakness. Patient had been recommended to go to the ER, but he preferred to stay home.   Called patient using Spanish interpreter. He is currently at home and reports a little over an hour ago he felt like he was about to pass out. During that time he had been laying down. He still feels a little weak and feels dazed but feels that he is able to perform his ADLs.  Discussed that since he has been symptomatic with these pauses he would benefit from admission to medicine for further evaluation of electrolyte abnormalities and telemonitoring. Would plan for HD + EP evaluation on 8/4 AM.     Current Outpatient Medications  Medication Instructions   acetaminophen  (TYLENOL ) 650 mg, Oral, Every 6 hours PRN   atorvastatin  (LIPITOR) 20 MG tablet TAKE 1 TABLET BY MOUTH DAILY WITH THE EVENING MEAL   B Complex-C-Folic Acid  (DIALYVITE 800) 0.8 MG TABS 1 tablet, Daily at bedtime   Eliquis  5 mg, Oral, 2 times daily   levothyroxine  (SYNTHROID ) 88 MCG tablet TAKE 1 TABLET(88 MCG) BY MOUTH DAILY   Lokelma  5 g, Oral, Daily   sevelamer  carbonate (RENVELA ) 1,600 mg, 3 times daily with meals   No Known  Allergies   For questions or updates, please contact [facility] Please consult [email or web address] for contact info under    Earlene CHRISTELLA Cluster, MD  03/14/2024 8:17 PM

## 2024-03-15 ENCOUNTER — Telehealth: Payer: Self-pay

## 2024-03-15 ENCOUNTER — Telehealth: Payer: Self-pay | Admitting: Internal Medicine

## 2024-03-15 NOTE — Telephone Encounter (Signed)
 [place] Nurse @ [facility] called with a request for vebral orders for home health. Voicemail left for nurse to return call .Her contact info is [phone].

## 2024-03-15 NOTE — Telephone Encounter (Signed)
 Permission for verbal received from Dr. Adella.[email or web address] with Nurse Bas[facility] of [facility].[email or web address] orders were giving over the phone. Fax will be sent for our records.

## 2024-03-16 ENCOUNTER — Inpatient Hospital Stay (HOSPITAL_COMMUNITY)
Admission: EM | Admit: 2024-03-16 | Discharge: 2024-03-19 | DRG: 308 | Disposition: A | Discharge status: Home / Self Care | Attending: Family Medicine | Admitting: Family Medicine

## 2024-03-16 ENCOUNTER — Emergency Department (HOSPITAL_COMMUNITY)

## 2024-03-16 ENCOUNTER — Other Ambulatory Visit: Payer: Self-pay

## 2024-03-16 ENCOUNTER — Encounter (HOSPITAL_COMMUNITY): Payer: Self-pay

## 2024-03-16 DIAGNOSIS — Z5982 Transportation insecurity: Secondary | ICD-10-CM

## 2024-03-16 DIAGNOSIS — R55 Syncope and collapse: Secondary | ICD-10-CM | POA: Insufficient documentation

## 2024-03-16 DIAGNOSIS — Z8679 Personal history of other diseases of the circulatory system: Secondary | ICD-10-CM

## 2024-03-16 DIAGNOSIS — I132 Hypertensive heart and chronic kidney disease with heart failure and with stage 5 chronic kidney disease, or end stage renal disease: Secondary | ICD-10-CM | POA: Diagnosis present

## 2024-03-16 DIAGNOSIS — R000 Tachycardia, unspecified: Secondary | ICD-10-CM | POA: Diagnosis present

## 2024-03-16 DIAGNOSIS — R079 Chest pain, unspecified: Secondary | ICD-10-CM | POA: Diagnosis present

## 2024-03-16 DIAGNOSIS — I484 Atypical atrial flutter: Secondary | ICD-10-CM | POA: Diagnosis not present

## 2024-03-16 DIAGNOSIS — E875 Hyperkalemia: Secondary | ICD-10-CM | POA: Diagnosis present

## 2024-03-16 DIAGNOSIS — I455 Other specified heart block: Secondary | ICD-10-CM

## 2024-03-16 DIAGNOSIS — Z992 Dependence on renal dialysis: Principal | ICD-10-CM

## 2024-03-16 DIAGNOSIS — I10 Essential (primary) hypertension: Secondary | ICD-10-CM | POA: Diagnosis present

## 2024-03-16 DIAGNOSIS — I4891 Unspecified atrial fibrillation: Secondary | ICD-10-CM | POA: Diagnosis not present

## 2024-03-16 DIAGNOSIS — I714 Abdominal aortic aneurysm, without rupture, unspecified: Secondary | ICD-10-CM | POA: Diagnosis present

## 2024-03-16 DIAGNOSIS — Y841 Kidney dialysis as the cause of abnormal reaction of the patient, or of later complication, without mention of misadventure at the time of the procedure: Secondary | ICD-10-CM | POA: Diagnosis present

## 2024-03-16 DIAGNOSIS — E785 Hyperlipidemia, unspecified: Secondary | ICD-10-CM | POA: Diagnosis present

## 2024-03-16 DIAGNOSIS — Z833 Family history of diabetes mellitus: Secondary | ICD-10-CM

## 2024-03-16 DIAGNOSIS — I5032 Chronic diastolic (congestive) heart failure: Secondary | ICD-10-CM | POA: Diagnosis present

## 2024-03-16 DIAGNOSIS — Z8249 Family history of ischemic heart disease and other diseases of the circulatory system: Secondary | ICD-10-CM

## 2024-03-16 DIAGNOSIS — I495 Sick sinus syndrome: Secondary | ICD-10-CM | POA: Diagnosis not present

## 2024-03-16 DIAGNOSIS — Z79899 Other long term (current) drug therapy: Secondary | ICD-10-CM

## 2024-03-16 DIAGNOSIS — I2510 Atherosclerotic heart disease of native coronary artery without angina pectoris: Secondary | ICD-10-CM | POA: Diagnosis present

## 2024-03-16 DIAGNOSIS — Z7901 Long term (current) use of anticoagulants: Secondary | ICD-10-CM

## 2024-03-16 DIAGNOSIS — E039 Hypothyroidism, unspecified: Secondary | ICD-10-CM | POA: Diagnosis present

## 2024-03-16 DIAGNOSIS — Z5986 Financial insecurity: Secondary | ICD-10-CM

## 2024-03-16 DIAGNOSIS — I252 Old myocardial infarction: Secondary | ICD-10-CM

## 2024-03-16 DIAGNOSIS — Z87891 Personal history of nicotine dependence: Secondary | ICD-10-CM

## 2024-03-16 DIAGNOSIS — D696 Thrombocytopenia, unspecified: Secondary | ICD-10-CM | POA: Diagnosis present

## 2024-03-16 DIAGNOSIS — I493 Ventricular premature depolarization: Secondary | ICD-10-CM | POA: Diagnosis not present

## 2024-03-16 DIAGNOSIS — Z841 Family history of disorders of kidney and ureter: Secondary | ICD-10-CM

## 2024-03-16 DIAGNOSIS — Z603 Acculturation difficulty: Secondary | ICD-10-CM | POA: Diagnosis present

## 2024-03-16 DIAGNOSIS — Z79890 Hormone replacement therapy (postmenopausal): Secondary | ICD-10-CM

## 2024-03-16 DIAGNOSIS — E1122 Type 2 diabetes mellitus with diabetic chronic kidney disease: Secondary | ICD-10-CM | POA: Diagnosis present

## 2024-03-16 DIAGNOSIS — E1143 Type 2 diabetes mellitus with diabetic autonomic (poly)neuropathy: Secondary | ICD-10-CM | POA: Diagnosis present

## 2024-03-16 DIAGNOSIS — N186 End stage renal disease: Principal | ICD-10-CM

## 2024-03-16 DIAGNOSIS — T82838A Hemorrhage of vascular prosthetic devices, implants and grafts, initial encounter: Secondary | ICD-10-CM | POA: Diagnosis present

## 2024-03-16 DIAGNOSIS — I471 Supraventricular tachycardia, unspecified: Secondary | ICD-10-CM | POA: Diagnosis present

## 2024-03-16 DIAGNOSIS — D631 Anemia in chronic kidney disease: Secondary | ICD-10-CM | POA: Diagnosis present

## 2024-03-16 DIAGNOSIS — E119 Type 2 diabetes mellitus without complications: Secondary | ICD-10-CM

## 2024-03-16 LAB — BASIC METABOLIC PANEL WITH GFR
Anion gap: 13 (ref 5–15)
BUN: 49 mg/dL — ABNORMAL HIGH (ref 8–23)
CO2: 28 mmol/L (ref 22–32)
Calcium: 8.8 mg/dL — ABNORMAL LOW (ref 8.9–10.3)
Chloride: 92 mmol/L — ABNORMAL LOW (ref 98–111)
Creatinine, Ser: 5.98 mg/dL — ABNORMAL HIGH (ref 0.61–1.24)
GFR, Estimated: 10 mL/min — ABNORMAL LOW (ref >60)
Glucose, Bld: 103 mg/dL — ABNORMAL HIGH (ref 70–99)
Potassium: 4.6 mmol/L (ref 3.5–5.1)
Sodium: 133 mmol/L — ABNORMAL LOW (ref 135–145)

## 2024-03-16 LAB — CBC WITH DIFFERENTIAL/PLATELET
Abs Immature Granulocytes: 0.01 K/uL (ref 0.00–0.07)
Basophils Absolute: 0.1 K/uL (ref 0.0–0.1)
Basophils Relative: 1 %
Eosinophils Absolute: 0 K/uL (ref 0.0–0.5)
Eosinophils Relative: 1 %
HCT: 29.1 % — ABNORMAL LOW (ref 39.0–52.0)
Hemoglobin: 9.4 g/dL — ABNORMAL LOW (ref 13.0–17.0)
Immature Granulocytes: 0 %
Lymphocytes Relative: 25 %
Lymphs Abs: 1.4 K/uL (ref 0.7–4.0)
MCH: 32.2 pg (ref 26.0–34.0)
MCHC: 32.3 g/dL (ref 30.0–36.0)
MCV: 99.7 fL (ref 80.0–100.0)
Monocytes Absolute: 0.7 K/uL (ref 0.1–1.0)
Monocytes Relative: 13 %
Neutro Abs: 3.4 K/uL (ref 1.7–7.7)
Neutrophils Relative %: 60 %
Platelets: 143 K/uL — ABNORMAL LOW (ref 150–400)
RBC: 2.92 MIL/uL — ABNORMAL LOW (ref 4.22–5.81)
RDW: 15.1 % (ref 11.5–15.5)
WBC: 5.7 K/uL (ref 4.0–10.5)
nRBC: 0 % (ref 0.0–0.2)

## 2024-03-16 MED ORDER — OXIDIZED CELLULOSE EX PADS
1.0000 | MEDICATED_PAD | Freq: Once | CUTANEOUS | Status: AC
  Administered 2024-03-16: 1 via TOPICAL
  Filled 2024-03-16: qty 1

## 2024-03-16 MED ORDER — GELATIN ABSORBABLE 12-7 MM EX MISC: 1.0000 | Freq: Once | CUTANEOUS | Status: DC

## 2024-03-16 NOTE — ED Notes (Signed)
 Pt's fistula has stopped bleeding.

## 2024-03-16 NOTE — ED Provider Notes (Signed)
 [place] EMERGENCY DEPARTMENT AT [facility] Provider Note   CSN: [id number] Arrival date & time: 03/16/24  1636     Patient presents with: Vascular Access Problem   Ryan Wells is a 68 y.o. male.  {Add pertinent medical, surgical, social history, OB history to HPI:32947} Pt is a 68 yo male with pmhx significant for ESRD on HD (MWF), HTN, DM2, and hypothyroidism.  Pt had dialysis yesterday and noted that his needle puncture site was still bleeding last night around 10 pm when he took off the dressing.  He said it has continued to ooze all day, so he came in.  He was admitted from 7/28-81 for hyperkalemia with k of 7.8.  he was noted to develop SVT vs aflutter during dialysis.  He was given 2.5 mg metoprolol  which improved HR, but then it dropped in the 40s.  Ziopatch applied.  Pt got a call on Sunday (8/3) that he was having pauses in his heart and needed to come to the ED for admission for EP eval.  Pt said they were confused over this, the son tried to call back and could not talk to anyone, so they did not come in.  Pt is not on any AV nodal meds.  Pt said he has had episodes where he felt like he was going to die.  Due to language barrier, an interpreter was present during the history-taking and subsequent discussion (and for part of the physical exam) with this patient.        Prior to Admission medications   Medication Sig Start Date End Date Taking? Authorizing Provider  acetaminophen  (TYLENOL ) 325 MG tablet Take 2 tablets (650 mg total) by mouth every 6 (six) hours as needed for mild pain (or Fever >/= 101). 04/07/21   Raenelle Coria, MD  apixaban  (ELIQUIS ) 5 MG TABS tablet Take 1 tablet (5 mg total) by mouth 2 (two) times daily. 02/23/24 03/24/24  Christobal Guadalajara, MD  atorvastatin  (LIPITOR) 20 MG tablet TAKE 1 TABLET BY MOUTH DAILY WITH THE EVENING MEAL Patient taking differently: Take 20 mg by mouth at bedtime. 05/23/23   Adella Norris, MD  B Complex-C-Folic  Acid (DIALYVITE 800) 0.8 MG TABS Take 1 tablet by mouth at bedtime.    [provider]  levothyroxine  (SYNTHROID ) 88 MCG tablet TAKE 1 TABLET(88 MCG) BY MOUTH DAILY Patient taking differently: Take 88 mcg by mouth at bedtime. 05/23/23   Adella Norris, MD  sevelamer  carbonate (RENVELA ) 800 MG tablet Take 1,600 mg by mouth with breakfast, with lunch, and with evening meal. 01/09/24   [provider]  sodium zirconium cyclosilicate  (LOKELMA ) 5 g packet Take 5 g by mouth daily. 03/12/24 04/11/24  Krishnan, Gokul, MD    Allergies: Patient has no known allergies.    Review of Systems  Skin:        Bleeding from dialysis site  All other systems reviewed and are negative.   Updated Vital Signs BP 137/73 (BP Location: Right Arm)   Pulse 93   Temp 98.4 F (36.9 C)   Resp 16   Ht 5' 2 (1.575 m)   Wt 69.9 kg   SpO2 100%   BMI 28.17 kg/m   Physical Exam Vitals and nursing note reviewed.  Constitutional:      Appearance: Normal appearance.  HENT:     Head: Normocephalic and atraumatic.     Right Ear: External ear normal.     Left Ear: External ear normal.  Nose: Nose normal.     Mouth/Throat:     Mouth: Mucous membranes are moist.     Pharynx: Oropharynx is clear.  Eyes:     Extraocular Movements: Extraocular movements intact.     Conjunctiva/sclera: Conjunctivae normal.     Pupils: Pupils are equal, round, and reactive to light.  Cardiovascular:     Rate and Rhythm: Normal rate and regular rhythm.     Pulses: Normal pulses.     Heart sounds: Normal heart sounds.  Pulmonary:     Effort: Pulmonary effort is normal.     Breath sounds: Normal breath sounds.  Abdominal:     General: Abdomen is flat. Bowel sounds are normal.     Palpations: Abdomen is soft.  Musculoskeletal:        General: Normal range of motion.     Cervical back: Normal range of motion and neck supple.  Skin:    General: Skin is warm.     Capillary Refill: Capillary refill takes less  than 2 seconds.     Comments: Left upper arm AVF with oozing wound  Neurological:     General: No focal deficit present.     Mental Status: He is alert and oriented to person, place, and time.  Psychiatric:        Mood and Affect: Mood normal.        Behavior: Behavior normal.     (all labs ordered are listed, but only abnormal results are displayed) Labs Reviewed  BASIC METABOLIC PANEL WITH GFR - Abnormal; Notable for the following components:      Result Value   Sodium 133 (*)    Chloride 92 (*)    Glucose, Bld 103 (*)    BUN 49 (*)    Creatinine, Ser 5.98 (*)    Calcium  8.8 (*)    GFR, Estimated 10 (*)    All other components within normal limits  CBC WITH DIFFERENTIAL/PLATELET - Abnormal; Notable for the following components:   RBC 2.92 (*)    Hemoglobin 9.4 (*)    HCT 29.1 (*)    Platelets 143 (*)    All other components within normal limits  CBG MONITORING, ED    EKG: None  Radiology: DG Chest Port 1 View Result Date: 03/16/2024 CLINICAL DATA:  Shortness of breath and bleeding from dialysis fistula following completion of dialysis 1 hour previous. EXAM: PORTABLE CHEST 1 VIEW COMPARISON:  Portable chest 03/08/2024. FINDINGS: The heart is moderately enlarged but unchanged. No vascular congestion is seen. The mediastinum is normally outlined. There is calcification in the transverse aorta. There is scattered linear scarring or atelectasis in the lung fields and minimal right pleural effusion. The lungs are otherwise clear. An electronic device overlies the left mid chest. There is osteopenia and degenerative change of the spine. A 4 level lower cervical ACDF plating is again shown. IMPRESSION: 1. Stable cardiomegaly. No vascular congestion. 2. Scattered linear scarring or atelectasis in the lung fields and minimal right pleural effusion. 3. Aortic atherosclerosis. Electronically Signed   By: Francis Quam M.D.   On: 03/16/2024 21:40    {Document cardiac monitor, telemetry  assessment procedure when appropriate:32947} Procedures   Medications Ordered in the ED  oxidized cellulose (Surgicel) pad 1 each (1 each Topical Given by Other 03/16/24 2240)      {Click here for ABCD2, HEART and other calculators REFRESH Note before signing:1}  Medical Decision Making Amount and/or Complexity of Data Reviewed Labs: ordered. Radiology: ordered.  Risk Prescription drug management.   This patient presents to the ED for concern of bleeding, this involves an extensive number of treatment options, and is a complaint that carries with it a high risk of complications and morbidity.  The differential diagnosis includes anemia, electrolyte abn   Co morbidities that complicate the patient evaluation  ESRD on HD (MWF), HTN, DM2, and hypothyroidism   Additional history obtained:  Additional history obtained from epic chart review External records from outside source obtained and reviewed including wife   Lab Tests:  I Ordered, and personally interpreted labs.  The pertinent results include:  cbc with hgb 9.4 (stable); bmp with bun 49 and cr 5.98 (stable)   Imaging Studies ordered:  I ordered imaging studies including cxr  I independently visualized and interpreted imaging which showed  1. Stable cardiomegaly. No vascular congestion.  2. Scattered linear scarring or atelectasis in the lung fields and  minimal right pleural effusion.  3. Aortic atherosclerosis.   I agree with the radiologist interpretation   Cardiac Monitoring:  The patient was maintained on a cardiac monitor.  I personally viewed and interpreted the cardiac monitored which showed an underlying rhythm of: ***   Medicines ordered and prescription drug management:  I ordered medication including ***  for ***  Reevaluation of the patient after these medicines showed that the patient {resolved/improved/worsened:23923::improved} I have reviewed the patients home  medicines and have made adjustments as needed   Test Considered:  ***   Critical Interventions:  ***   Consultations Obtained:  I requested consultation with the ***,  and discussed lab and imaging findings as well as pertinent plan - they recommend: ***   Problem List / ED Course:  Bleeding AVF site:  dermabond attempted, but site was bleeding too much.  Combat gauze used which helped and surgicel used which stopped the bleeding. Sinus pauses per cards note:  admission rec with EP consult in am.   Reevaluation:  After the interventions noted above, I reevaluated the patient and found that they have :improved   Social Determinants of Health:  Spanish speaker   Dispostion:  After consideration of the diagnostic results and the patients response to treatment, I feel that the patent would benefit from admission.    {Document critical care time when appropriate  Document review of labs and clinical decision tools ie CHADS2VASC2, etc  Document your independent review of radiology images and any outside records  Document your discussion with family members, caretakers and with consultants  Document social determinants of health affecting pt's care  Document your decision making why or why not admission, treatments were needed:32947:::1}   Final diagnoses:  ESRD (end stage renal disease) on dialysis (HCC)  Bleeding from dialysis shunt, initial encounter [facility])    ED Discharge Orders     None

## 2024-03-16 NOTE — Plan of Care (Incomplete)
 68 year old male with history of ESRD on HD MWF, hypothyroidism, CHF, CAD, type 2 diabetes, diabetic gastroparesis, hypertension, hyperlipidemia, anemia of chronic disease, chronic thrombocytopenia, AAA.  Recent hospital admission 7/28-8/1 for hyperkalemic emergency and was noted to develop atypical atrial flutter versus SVT during dialysis.  He was given 2.5 mg of metoprolol  which he became bradycardic and unresponsive.  Cardiology had applied Zio patch and recommended avoiding AV nodal blocking agents.  On 8/3, patient noted to have a 6.5-second pause with an underlying rhythm and had near syncope.  Cardiology had recommended him coming into the ED but he preferred to stay home.  Patient presents to the ED tonight due to ongoing oozing of blood from his left arm AV fistula since after he finished dialysis on Monday 8/4.  Hypertensive with SBP up to 160 and mildly tachycardic.  Labs notable for hemoglobin 9.4 (stable), platelet count 143k (stable), sodium 133, potassium 4.6, chloride 92, bicarb 28

## 2024-03-16 NOTE — ED Provider Triage Note (Signed)
 Emergency Medicine Provider Triage Evaluation Note  Ryan Wells , a 68 y.o. male  was evaluated in triage.  Pt complains of bleeding from fistula site after dialysis. Well rolled after direct pressure.  Review of Systems  Positive: Vascular access problem Negative: Fever  Physical Exam  BP (!) 158/70 (BP Location: Right Arm)   Pulse 61   Temp 97.8 F (36.6 C)   Resp 16   SpO2 96%  Gen:   Awake, no distress   Resp:  Normal effort  MSK:   Moves extremities without difficulty  Other:    Medical Decision Making  Medically screening exam initiated at 5:38 PM.  Appropriate orders placed.  Ryan Wells was informed that the remainder of the evaluation will be completed by another provider, this initial triage assessment does not replace that evaluation, and the importance of remaining in the ED until their evaluation is complete.     Ryan Wells, [place] 03/16/24 8261

## 2024-03-16 NOTE — ED Triage Notes (Signed)
 PT arrives via POV. PT reports he completed dialysis about 1 hour ago. He states his fistula in his left arm has been bleeding since then. Pt has been holding pressure on the fistula. When patient removed his hand, there was no longer any bleeding. Pt AxOx4.

## 2024-03-17 DIAGNOSIS — Z8679 Personal history of other diseases of the circulatory system: Secondary | ICD-10-CM

## 2024-03-17 DIAGNOSIS — Z992 Dependence on renal dialysis: Secondary | ICD-10-CM

## 2024-03-17 DIAGNOSIS — R001 Bradycardia, unspecified: Secondary | ICD-10-CM

## 2024-03-17 DIAGNOSIS — I4891 Unspecified atrial fibrillation: Secondary | ICD-10-CM

## 2024-03-17 DIAGNOSIS — I455 Other specified heart block: Secondary | ICD-10-CM | POA: Diagnosis not present

## 2024-03-17 DIAGNOSIS — R55 Syncope and collapse: Secondary | ICD-10-CM | POA: Diagnosis not present

## 2024-03-17 DIAGNOSIS — R000 Tachycardia, unspecified: Secondary | ICD-10-CM | POA: Diagnosis not present

## 2024-03-17 DIAGNOSIS — I5032 Chronic diastolic (congestive) heart failure: Secondary | ICD-10-CM | POA: Insufficient documentation

## 2024-03-17 DIAGNOSIS — N186 End stage renal disease: Secondary | ICD-10-CM

## 2024-03-17 LAB — BASIC METABOLIC PANEL WITH GFR
Anion gap: 14 (ref 5–15)
BUN: 52 mg/dL — ABNORMAL HIGH (ref 8–23)
CO2: 27 mmol/L (ref 22–32)
Calcium: 8.9 mg/dL (ref 8.9–10.3)
Chloride: 93 mmol/L — ABNORMAL LOW (ref 98–111)
Creatinine, Ser: 6.67 mg/dL — ABNORMAL HIGH (ref 0.61–1.24)
GFR, Estimated: 8 mL/min — ABNORMAL LOW (ref >60)
Glucose, Bld: 117 mg/dL — ABNORMAL HIGH (ref 70–99)
Potassium: 4.8 mmol/L (ref 3.5–5.1)
Sodium: 134 mmol/L — ABNORMAL LOW (ref 135–145)

## 2024-03-17 LAB — CBC
HCT: 27.1 % — ABNORMAL LOW (ref 39.0–52.0)
Hemoglobin: 9 g/dL — ABNORMAL LOW (ref 13.0–17.0)
MCH: 33 pg (ref 26.0–34.0)
MCHC: 33.2 g/dL (ref 30.0–36.0)
MCV: 99.3 fL (ref 80.0–100.0)
Platelets: 149 K/uL — ABNORMAL LOW (ref 150–400)
RBC: 2.73 MIL/uL — ABNORMAL LOW (ref 4.22–5.81)
RDW: 15.5 % (ref 11.5–15.5)
WBC: 5.5 K/uL (ref 4.0–10.5)
nRBC: 0 % (ref 0.0–0.2)

## 2024-03-17 LAB — CBG MONITORING, ED
Glucose-Capillary: 100 mg/dL — ABNORMAL HIGH (ref 70–99)
Glucose-Capillary: 117 mg/dL — ABNORMAL HIGH (ref 70–99)

## 2024-03-17 LAB — GLUCOSE, CAPILLARY
Glucose-Capillary: 174 mg/dL — ABNORMAL HIGH (ref 70–99)
Glucose-Capillary: 99 mg/dL (ref 70–99)

## 2024-03-17 LAB — MAGNESIUM: Magnesium: 2.3 mg/dL (ref 1.7–2.4)

## 2024-03-17 MED ORDER — LEVOTHYROXINE SODIUM 88 MCG PO TABS
88.0000 ug | ORAL_TABLET | Freq: Every day | ORAL | Status: DC
  Administered 2024-03-17 – 2024-03-19 (×3): 88 ug via ORAL
  Filled 2024-03-17 (×3): qty 1

## 2024-03-17 MED ORDER — ACETAMINOPHEN 325 MG PO TABS: 650.0000 mg | ORAL_TABLET | Freq: Four times a day (QID) | ORAL | Status: DC | PRN

## 2024-03-17 MED ORDER — DILTIAZEM HCL-DEXTROSE 125-5 MG/125ML-% IV SOLN (PREMIX)
5.0000 mg/h | INTRAVENOUS | Status: DC
  Administered 2024-03-17: 5 mg/h via INTRAVENOUS
  Filled 2024-03-17: qty 125

## 2024-03-17 MED ORDER — METOPROLOL TARTRATE 5 MG/5ML IV SOLN: 5.0000 mg | Freq: Four times a day (QID) | INTRAVENOUS | Status: DC | PRN

## 2024-03-17 MED ORDER — INSULIN ASPART 100 UNIT/ML IJ SOLN
0.0000 [IU] | Freq: Three times a day (TID) | INTRAMUSCULAR | Status: DC
  Administered 2024-03-17: 1 [IU] via SUBCUTANEOUS
  Administered 2024-03-18: 2 [IU] via SUBCUTANEOUS

## 2024-03-17 MED ORDER — RENA-VITE PO TABS
1.0000 | ORAL_TABLET | Freq: Every day | ORAL | Status: DC
  Administered 2024-03-17 – 2024-03-18 (×2): 1 via ORAL
  Filled 2024-03-17 (×3): qty 1

## 2024-03-17 MED ORDER — INSULIN ASPART 100 UNIT/ML IJ SOLN: 0.0000 [IU] | Freq: Every day | INTRAMUSCULAR | Status: DC

## 2024-03-17 MED ORDER — ATORVASTATIN CALCIUM 10 MG PO TABS
20.0000 mg | ORAL_TABLET | Freq: Every day | ORAL | Status: DC
  Administered 2024-03-17 – 2024-03-18 (×2): 20 mg via ORAL
  Filled 2024-03-17 (×2): qty 2

## 2024-03-17 MED ORDER — SEVELAMER CARBONATE 800 MG PO TABS
1600.0000 mg | ORAL_TABLET | Freq: Three times a day (TID) | ORAL | Status: DC
  Administered 2024-03-17 – 2024-03-19 (×6): 1600 mg via ORAL
  Filled 2024-03-17 (×6): qty 2

## 2024-03-17 MED ORDER — ACETAMINOPHEN 650 MG RE SUPP: 650.0000 mg | Freq: Four times a day (QID) | RECTAL | Status: DC | PRN

## 2024-03-17 MED ORDER — SODIUM ZIRCONIUM CYCLOSILICATE 5 G PO PACK
5.0000 g | PACK | Freq: Every day | ORAL | Status: DC
  Administered 2024-03-18 – 2024-03-19 (×2): 5 g via ORAL
  Filled 2024-03-17 (×3): qty 1

## 2024-03-17 NOTE — ED Notes (Signed)
 Report given to paden RN.

## 2024-03-17 NOTE — H&P (Signed)
 History and Physical    Ryan Wells FMW:[id number] DOB: [birth year] DOA: 03/16/2024  PCP: Adella Norris, MD  Patient coming from: Home  Chief Complaint: Bleeding from AV fistula site  HPI: Ryan Wells is a 68 y.o. male with medical history significant of ESRD on HD MWF, hypothyroidism, CHF, CAD, type 2 diabetes, diabetic gastroparesis, hypertension, hyperlipidemia, anemia of chronic disease, chronic thrombocytopenia, AAA.  Recent hospital admission 7/28-8/1 for hyperkalemic emergency and was noted to develop atypical atrial flutter versus SVT during dialysis.  He was given 2.5 mg of IV metoprolol  which he became bradycardic and unresponsive.  Cardiology had applied Zio patch and recommended avoiding AV nodal blocking agents.  On 8/3, patient noted to have a 6.5-second pause with an underlying rhythm and had near syncope.  Cardiology has spoken to the patient and recommended hospital admission and EP evaluation but patient did not come into the ED at that time.    He presents to the ED tonight due to ongoing oozing of blood from his left arm AV fistula since after he finished dialysis on Monday 8/4.  Bleeding stopped after placement of Surgicel dressing in the ED.  Patient noted to be mildly tachycardic.  EKG showing sinus tachycardia, LPFB, QTc 505, and no acute ischemic changes.  Labs notable for hemoglobin 9.4 (stable), platelet count 143k (stable), sodium 133, potassium 4.6, chloride 92, bicarb 28.  Chest x-ray showing stable cardiomegaly and no vascular congestion.  Showing scattered linear scarring or atelectasis in the lung fields and minimal right pleural effusion.  TR[facility] called to admit for cardiology consult/EP evaluation in the morning.  Also he will need routine dialysis in the morning.    Spanish video interpreter used.  Patient appears to be a poor historian.  He does not recall speaking to cardiology a few days ago.  Denies any chest pain, palpitations, dizziness, or  any episodes of syncope or near syncope.  States the main reason he came into the emergency room was that since after he had dialysis on Monday he continued to have oozing of blood from his left arm AV fistula.  He has no other complaints.  Review of Systems:  Review of Systems  All other systems reviewed and are negative.   Past Medical History:  Diagnosis Date   3rd nerve palsy, partial, right 01/03/2017   AAA (abdominal aortic aneurysm) [facility])    Not noted on CT abd 2019   Chest pain 11/2013   Normal Echo/ EKG/enzymes-hospitalized.  Did not get outpatient stress testing following hospitalization.  No chest pain since   Chronic kidney disease    on dialysis Tues, Thurs and Sat   Diabetes mellitus without complication (HCC)    Diabetes type 2, uncontrolled 11/25/2011   no meds, diet controlled per patient   Diabetic gastroparesis (HCC) 01/22/2017   Hypertension    no meds   Microalbuminuria 01/19/2017   Myocardial infarction [facility]) 2015    Past Surgical History:  Procedure Laterality Date   A/V FISTULAGRAM Left 06/04/2019   Procedure: A/V FISTULAGRAM;  Surgeon: Eliza Lonni RAMAN, MD;  Location: [facility] INVASIVE CV LAB;  Service: Cardiovascular;  Laterality: Left;   A/V FISTULAGRAM Left 10/15/2019   Procedure: A/V FISTULAGRAM;  Surgeon: Eliza Lonni RAMAN, MD;  Location: [facility] INVASIVE CV LAB;  Service: Cardiovascular;  Laterality: Left;   A/V FISTULAGRAM N/A 01/24/2021   Procedure: A/V FISTULAGRAM - Left Upper;  Surgeon: Magda Debby SAILOR, MD;  Location: [facility] INVASIVE CV LAB;  Service: Cardiovascular;  Laterality:  N/A;   AV FISTULA PLACEMENT Left 07/02/2018   Procedure: BRACHIOCEPHALIC ARTERIOVENOUS (AV) FISTULA CREATION LEFT ARM;  Surgeon: Serene Gaile ORN, MD;  Location: [facility] OR;  Service: Vascular;  Laterality: Left;   BASCILIC VEIN TRANSPOSITION Left 07/26/2019   Procedure: Bascilic Vein Transposition;  Surgeon: Eliza Lonni RAMAN, MD;  Location: [facility] OR;  Service: Vascular;   Laterality: Left;   COLONOSCOPY     EMBOLIZATION (CATH LAB) Left 06/04/2019   Procedure: EMBOLIZATION;  Surgeon: Eliza Lonni RAMAN, MD;  Location: [facility] INVASIVE CV LAB;  Service: Cardiovascular;  Laterality: Left;  LT ARM FISTULA/COMPETING BRANCH   EYE SURGERY Bilateral    cataracts x2   EYE SURGERY     INSERTION OF DIALYSIS CATHETER Right 06/08/2019   Procedure: INSERTION OF PALINDROME DIALYSIS CATHETER IN RIGHT INTERNAL JUGULAR;  Surgeon: Eliza Lonni RAMAN, MD;  Location: [facility] OR;  Service: Vascular;  Laterality: Right;   LIGATION OF ARTERIOVENOUS  FISTULA Left 07/26/2019   Procedure: Ligation Of BrachioCephalic  Fistula;  Surgeon: Eliza Lonni RAMAN, MD;  Location: [facility] OR;  Service: Vascular;  Laterality: Left;   NECK SURGERY     PERIPHERAL VASCULAR BALLOON ANGIOPLASTY Left 06/04/2019   Procedure: PERIPHERAL VASCULAR BALLOON ANGIOPLASTY;  Surgeon: Eliza Lonni RAMAN, MD;  Location: [facility] INVASIVE CV LAB;  Service: Cardiovascular;  Laterality: Left;  ARM FISTULA   PERIPHERAL VASCULAR BALLOON ANGIOPLASTY Left 10/15/2019   Procedure: PERIPHERAL VASCULAR BALLOON ANGIOPLASTY;  Surgeon: Eliza Lonni RAMAN, MD;  Location: [facility] INVASIVE CV LAB;  Service: Cardiovascular;  Laterality: Left;  arm fistula   TEE WITHOUT CARDIOVERSION N/A 01/19/2021   Procedure: TRANSESOPHAGEAL ECHOCARDIOGRAM (TEE);  Surgeon: Jeffrie Oneil BROCKS, MD;  Location: [facility] ENDOSCOPY;  Service: Cardiovascular;  Laterality: N/A;     reports that he has quit smoking. He has never used smokeless tobacco. He reports that he does not currently use alcohol. He reports that he does not use drugs.  No Known Allergies  Family History  Problem Relation Age of Onset   Heart disease Mother        cause of death--CHF?   Diabetes Father    Kidney disease Father        Dialysis for kidney failure   Diabetes Sister    Diabetes Brother    Colon cancer Neg Hx    Stomach cancer Neg Hx    Esophageal cancer Neg Hx     Prior to  Admission medications   Medication Sig Start Date End Date Taking? Authorizing Provider  apixaban  (ELIQUIS ) 5 MG TABS tablet Take 1 tablet (5 mg total) by mouth 2 (two) times daily. 02/23/24 03/24/24 Yes Kc, Mennie, MD  atorvastatin  (LIPITOR) 20 MG tablet TAKE 1 TABLET BY MOUTH DAILY WITH THE EVENING MEAL Patient taking differently: Take 20 mg by mouth at bedtime. 05/23/23  Yes Adella Norris, MD  B Complex-C-Folic Acid  (DIALYVITE 800) 0.8 MG TABS Take 1 tablet by mouth at bedtime.   Yes [provider]  levothyroxine  (SYNTHROID ) 88 MCG tablet TAKE 1 TABLET(88 MCG) BY MOUTH DAILY Patient taking differently: Take 88 mcg by mouth at bedtime. 05/23/23  Yes Adella Norris, MD  sevelamer  carbonate (RENVELA ) 800 MG tablet Take 1,600 mg by mouth with breakfast, with lunch, and with evening meal. 01/09/24  Yes [provider]  sodium zirconium cyclosilicate  (LOKELMA ) 5 g packet Take 5 g by mouth daily. 03/12/24 04/11/24 Yes Krishnan, Gokul, MD  acetaminophen  (TYLENOL ) 325 MG tablet Take 2 tablets (650 mg total) by mouth every 6 (six) hours as  needed for mild pain (or Fever >/= 101). 04/07/21   Raenelle Coria, MD    Physical Exam: Vitals:   03/16/24 2330 03/17/24 0230 03/17/24 0237 03/17/24 0245  BP: (!) 160/78 133/71  124/71  Pulse:  98    Resp: 15 (!) 21  14  Temp:   98.3 F (36.8 C)   TempSrc:   Oral   SpO2: 100% 100%    Weight:      Height:        Physical Exam Vitals reviewed.  Constitutional:      General: He is not in acute distress. HENT:     Head: Normocephalic and atraumatic.  Eyes:     Extraocular Movements: Extraocular movements intact.  Cardiovascular:     Rate and Rhythm: Normal rate and regular rhythm.     Pulses: Normal pulses.  Pulmonary:     Effort: Pulmonary effort is normal. No respiratory distress.     Breath sounds: Normal breath sounds. No wheezing or rales.  Abdominal:     General: Bowel sounds are normal. There is no distension.      Palpations: Abdomen is soft.     Tenderness: There is no abdominal tenderness. There is no guarding.  Musculoskeletal:     Cervical back: Normal range of motion.     Right lower leg: Edema present.     Left lower leg: Edema present.     Comments: 1+ pitting edema of bilateral lower legs Left upper extremity AV fistula with Surgicel dressing in place and no active bleeding  Skin:    General: Skin is warm and dry.  Neurological:     General: No focal deficit present.     Mental Status: He is alert and oriented to person, place, and time.     Labs on Admission: I have personally reviewed following labs and imaging studies  CBC: Recent Labs  Lab 03/12/24 0855 03/16/24 2140  WBC 6.7 5.7  NEUTROABS  --  3.4  HGB 8.8* 9.4*  HCT 26.9* 29.1*  MCV 97.8 99.7  PLT 116* 143*   Basic Metabolic Panel: Recent Labs  Lab 03/10/24 1955 03/11/24 0433 03/11/24 0842 03/11/24 1332 03/12/24 0500 03/12/24 0855 03/16/24 2140  NA 133*  --   --   --   --  131* 133*  K 4.9 5.1 5.2* 5.0  --  5.2* 4.6  CL 93*  --   --   --   --  91* 92*  CO2 30  --   --   --   --  26 28  GLUCOSE 174*  --   --   --   --  132* 103*  BUN 32*  --   --   --   --  70* 49*  CREATININE 4.56*  --   --   --   --  7.87* 5.98*  CALCIUM  8.9  --   --   --   --  8.6* 8.8*  PHOS  --   --   --   --  5.3* 5.2*  --    GFR: Estimated Creatinine Clearance: 10 mL/min (A) (by C-G formula based on SCr of 5.98 mg/dL (H)). Liver Function Tests: Recent Labs  Lab 03/12/24 0855  ALBUMIN  3.5   No results for input(s): LIPASE, AMYLASE in the last 168 hours. No results for input(s): AMMONIA in the last 168 hours. Coagulation Profile: No results for input(s): INR, PROTIME in the last 168 hours. Cardiac Enzymes: No results for  input(s): CKTOTAL, CKMB, CKMBINDEX, TROPONINI in the last 168 hours. BNP (last 3 results) No results for input(s): PROBNP in the last 8760 hours. HbA1C: No results for input(s): HGBA1C  in the last 72 hours. CBG: Recent Labs  Lab 03/11/24 1203 03/11/24 1610 03/11/24 2106 03/12/24 0744 03/12/24 1228  GLUCAP 115* 151* 157* 114* 115*   Lipid Profile: No results for input(s): CHOL, HDL, LDLCALC, TRIG, CHOLHDL, LDLDIRECT in the last 72 hours. Thyroid Function Tests: No results for input(s): TSH, T4TOTAL, FREET4, T3FREE, THYROIDAB in the last 72 hours. Anemia Panel: No results for input(s): VITAMINB12, FOLATE, FERRITIN, TIBC, IRON , RETICCTPCT in the last 72 hours. Urine analysis:    Component Value Date/Time   COLORURINE YELLOW 06/06/2019 1530   APPEARANCEUR CLEAR 06/06/2019 1530   LABSPEC 1.014 06/06/2019 1530   PHURINE 6.0 06/06/2019 1530   GLUCOSEU 150 (A) 06/06/2019 1530   HGBUR SMALL (A) 06/06/2019 1530   BILIRUBINUR NEGATIVE 06/06/2019 1530   BILIRUBINUR neg 12/19/2016 1512   KETONESUR NEGATIVE 06/06/2019 1530   PROTEINUR >=300 (A) 06/06/2019 1530   UROBILINOGEN 0.2 12/19/2016 1512   UROBILINOGEN 1.0 03/29/2010 1944   NITRITE NEGATIVE 06/06/2019 1530   LEUKOCYTESUR NEGATIVE 06/06/2019 1530    Radiological Exams on Admission: DG Chest Port 1 View Result Date: 03/16/2024 CLINICAL DATA:  Shortness of breath and bleeding from dialysis fistula following completion of dialysis 1 hour previous. EXAM: PORTABLE CHEST 1 VIEW COMPARISON:  Portable chest 03/08/2024. FINDINGS: The heart is moderately enlarged but unchanged. No vascular congestion is seen. The mediastinum is normally outlined. There is calcification in the transverse aorta. There is scattered linear scarring or atelectasis in the lung fields and minimal right pleural effusion. The lungs are otherwise clear. An electronic device overlies the left mid chest. There is osteopenia and degenerative change of the spine. A 4 level lower cervical ACDF plating is again shown. IMPRESSION: 1. Stable cardiomegaly. No vascular congestion. 2. Scattered linear scarring or atelectasis in the  lung fields and minimal right pleural effusion. 3. Aortic atherosclerosis. Electronically Signed   By: Francis Quam M.D.   On: 03/16/2024 21:40    Assessment and Plan  ESRD on HD MWF Bleeding from left upper extremity AV fistula site Patient was having oozing of blood from his left upper extremity AV fistula site since after he had dialysis on Monday.  Bleeding has now stopped after Surgicel dressing was placed in the ED.  Hemoglobin stable.  Potassium and bicarb within normal range.  Chest x-ray showing minimal right pleural effusion.  Patient is due for dialysis today and nephrology needs to be consulted in the morning.  Atypical atrial flutter versus SVT During his recent hospital admission for hyperkalemic emergency patient was noted to develop atypical atrial flutter versus SVT during dialysis.  After he was given 2.5 mg of IV metoprolol , patient had become bradycardic and unresponsive.  Cardiology had applied Zio patch and recommended avoiding AV nodal blocking agents.  After his hospital discharge on 8/3, cardiology had spoken to the patient as he was noted to have a 6.5-second pause with an underlying rhythm and had a near syncopal event at home.  Cardiology had recommended hospital admission and EP evaluation but patient did not want to come into the ED at that time.  EKG done in the ED tonight showing sinus tachycardia, LPFB, QTc 505, and no acute ischemic changes.  Heart rate currently in the 90s.  Monitor potassium and magnesium  level.  Avoid QT prolonging drugs and consult cardiology in  the morning.  Resume Eliquis  in the morning if his AV fistula site bleeding remains under control and if it is okay with nephrology.  Hypothyroidism Continue Synthroid .  Chronic CHF Last echo done 02/22/2024 showing EF 50%, mildly elevated PASP, mild mitral regurgitation.  Volume management with dialysis as above.  Type 2 diabetes Last A1c 6.0 in February 2025.  Not on diabetes medications.  Continue  CBG checks ACHS/very sensitive sliding scale insulin  as needed.  Hyperlipidemia Continue Lipitor.  Anemia of chronic disease Hemoglobin stable, monitor labs.  Chronic thrombocytopenia Stable, monitor labs.  DVT prophylaxis: SCDs Code Status: Full Code (discussed with the patient) Family Communication: No family available at this time. Level of care: Telemetry bed Admission status: It is my clinical opinion that referral for OBSERVATION is reasonable and necessary in this patient based on the above information provided. The aforementioned taken together are felt to place the patient at high risk for further clinical deterioration. However, it is anticipated that the patient may be medically stable for discharge from the hospital within 24 to 48 hours.  Editha Ram MD [facility]  If 7PM-7AM, please contact night-coverage [email or web address]  03/17/2024, 4:40 AM

## 2024-03-17 NOTE — Consult Note (Addendum)
 Cardiology Consultation  Patient ID: [name] MRN: [id number]; DOB: 1956/06/03  Admit date: 03/16/2024 Date of Consult: 03/17/2024  PCP:  Adella Norris, MD   [facility] Providers Cardiologist:  Lonni LITTIE Nanas, MD     Patient Profile: Ryan Wells is a 68 y.o. male with a hx of ESRD on HD on MWF, hypertension, diabetes, prior psoas abscess with MSSA bacteremia, atrial flutter, bradycardia, syncope, anemia of chronic disease, thrombocytopenia, AAA, diabetic gastroparesis, hypothyroidism, who is being seen 03/17/2024 for the evaluation of atrial flutter vs SVT at the request of Dr. Jonel.  History of Present Illness: [name] has past medical history as stated above.  He presented to the [facility] emergency department on 03/16/2024 with bleeding from his AV fistula site.  He reported ongoing oozing of blood from his left arm AV fistula site after finishing dialysis on Monday 8/4.  He was recently hospitalized from 7/28-03/12/2024 for hyperkalemia, was noted to develop atypical atrial flutter vs SVT during dialysis.  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  It seems that last time all of these issues were primarily in the setting of hyperkalemia.  It was difficult to treat his SVT/NSVT/atrial flutter due to the intermittent bradycardia.  It appears that most of the times when his potassium was corrected his rhythms were normal.  When this patient was last seen by cardiology, as an inpatient on 7/31, it was noted that he had multiple arrhythmias, most in the setting of profound hyperkalemia and that most of them have subsided once his potassium was normalized.  And noted that he was typically in sinus bradycardia, rates  typically in the 50s, however he did have a 5.5-second pause while asleep.  Patient was sent home on a 4-week live monitor, by using two 2-week live monitors via ZIO.  A discussion for setting up for sleep study was also had, with the consideration that apnea may be leading to bradycardia.  It was recommended that the patient remain on Eliquis , due to possible stroke risk especially with recurrences of atrial flutter.  Final report from the live ZIO monitor has not been read however we have access to his preliminary reports.  From brief review from the Zio app it appears that the patient has frequent episodes of atrial fibrillation, atrial flutter, frequent ectopy, some brief NSVT, as well as some significant pauses, the longest being 6.5 seconds (as seen below):   After speaking with the patient via Spanish interpreter ID # [id number]. Patient tells me that he has no symptoms. He denies ever feeling dizzy, syncopal, lightheaded. He denies any issues with dialysis besides not getting it yet today, while in the hospital. He denies ever missing any sessions. He says he is feeling good and only came into the hospital because he was having an issue with his AV fistula site.   Past Medical History:  Diagnosis Date   3rd nerve palsy, partial, right 01/03/2017   AAA (abdominal aortic aneurysm) [facility])    Not noted on CT abd 2019   Chest pain 11/2013   Normal Echo/ EKG/enzymes-hospitalized.  Did not get outpatient stress testing following hospitalization.  No chest pain since   Chronic kidney disease    on dialysis Tues, Thurs and Sat   Diabetes mellitus without complication (HCC)    Diabetes type 2,  uncontrolled 11/25/2011   no meds, diet controlled per patient   Diabetic gastroparesis (HCC) 01/22/2017   Hypertension    no meds   Microalbuminuria 01/19/2017   Myocardial infarction [facility]) 2015   Past Surgical History:  Procedure Laterality Date   A/V FISTULAGRAM Left 06/04/2019   Procedure: A/V FISTULAGRAM;   Surgeon: Eliza Lonni RAMAN, MD;  Location: [facility] INVASIVE CV LAB;  Service: Cardiovascular;  Laterality: Left;   A/V FISTULAGRAM Left 10/15/2019   Procedure: A/V FISTULAGRAM;  Surgeon: Eliza Lonni RAMAN, MD;  Location: [facility] INVASIVE CV LAB;  Service: Cardiovascular;  Laterality: Left;   A/V FISTULAGRAM N/A 01/24/2021   Procedure: A/V FISTULAGRAM - Left Upper;  Surgeon: Magda Debby SAILOR, MD;  Location: [facility] INVASIVE CV LAB;  Service: Cardiovascular;  Laterality: N/A;   AV FISTULA PLACEMENT Left 07/02/2018   Procedure: BRACHIOCEPHALIC ARTERIOVENOUS (AV) FISTULA CREATION LEFT ARM;  Surgeon: Serene Gaile ORN, MD;  Location: [facility] OR;  Service: Vascular;  Laterality: Left;   BASCILIC VEIN TRANSPOSITION Left 07/26/2019   Procedure: Bascilic Vein Transposition;  Surgeon: Eliza Lonni RAMAN, MD;  Location: [facility] OR;  Service: Vascular;  Laterality: Left;   COLONOSCOPY     EMBOLIZATION (CATH LAB) Left 06/04/2019   Procedure: EMBOLIZATION;  Surgeon: Eliza Lonni RAMAN, MD;  Location: [facility] INVASIVE CV LAB;  Service: Cardiovascular;  Laterality: Left;  LT ARM FISTULA/COMPETING BRANCH   EYE SURGERY Bilateral    cataracts x2   EYE SURGERY     INSERTION OF DIALYSIS CATHETER Right 06/08/2019   Procedure: INSERTION OF PALINDROME DIALYSIS CATHETER IN RIGHT INTERNAL JUGULAR;  Surgeon: Eliza Lonni RAMAN, MD;  Location: [facility] OR;  Service: Vascular;  Laterality: Right;   LIGATION OF ARTERIOVENOUS  FISTULA Left 07/26/2019   Procedure: Ligation Of BrachioCephalic  Fistula;  Surgeon: Eliza Lonni RAMAN, MD;  Location: [facility] OR;  Service: Vascular;  Laterality: Left;   NECK SURGERY     PERIPHERAL VASCULAR BALLOON ANGIOPLASTY Left 06/04/2019   Procedure: PERIPHERAL VASCULAR BALLOON ANGIOPLASTY;  Surgeon: Eliza Lonni RAMAN, MD;  Location: [facility] INVASIVE CV LAB;  Service: Cardiovascular;  Laterality: Left;  ARM FISTULA   PERIPHERAL VASCULAR BALLOON ANGIOPLASTY Left 10/15/2019   Procedure: PERIPHERAL VASCULAR BALLOON  ANGIOPLASTY;  Surgeon: Eliza Lonni RAMAN, MD;  Location: [facility] INVASIVE CV LAB;  Service: Cardiovascular;  Laterality: Left;  arm fistula   TEE WITHOUT CARDIOVERSION N/A 01/19/2021   Procedure: TRANSESOPHAGEAL ECHOCARDIOGRAM (TEE);  Surgeon: Jeffrie Oneil BROCKS, MD;  Location: [facility] ENDOSCOPY;  Service: Cardiovascular;  Laterality: N/A;    Home Medications:  Prior to Admission medications   Medication Sig Start Date End Date Taking? Authorizing Provider  apixaban  (ELIQUIS ) 5 MG TABS tablet Take 1 tablet (5 mg total) by mouth 2 (two) times daily. 02/23/24 03/24/24 Yes Christobal Guadalajara, MD  atorvastatin  (LIPITOR) 20 MG tablet TAKE 1 TABLET BY MOUTH DAILY WITH THE EVENING MEAL Patient taking differently: Take 20 mg by mouth at bedtime. 05/23/23  Yes Adella Norris, MD  B Complex-C-Folic Acid  (DIALYVITE 800) 0.8 MG TABS Take 1 tablet by mouth at bedtime.   Yes [provider]  levothyroxine  (SYNTHROID ) 88 MCG tablet TAKE 1 TABLET(88 MCG) BY MOUTH DAILY Patient taking differently: Take 88 mcg by mouth at bedtime. 05/23/23  Yes Adella Norris, MD  sevelamer  carbonate (RENVELA ) 800 MG tablet Take 1,600 mg by mouth with breakfast, with lunch, and with evening meal. 01/09/24  Yes [provider]  sodium zirconium cyclosilicate  (LOKELMA ) 5 g packet Take 5 g by mouth daily. 03/12/24 04/11/24 Yes  Krishnan, Gokul, MD  acetaminophen  (TYLENOL ) 325 MG tablet Take 2 tablets (650 mg total) by mouth every 6 (six) hours as needed for mild pain (or Fever >/= 101). 04/07/21   Raenelle Coria, MD   Scheduled Meds:  atorvastatin   20 mg Oral QHS   insulin  aspart  0-5 Units Subcutaneous QHS   insulin  aspart  0-6 Units Subcutaneous TID WC   levothyroxine   88 mcg Oral QAC breakfast   multivitamin  1 tablet Oral QHS   sevelamer  carbonate  1,600 mg Oral TID with meals   sodium zirconium cyclosilicate   5 g Oral Daily   Continuous Infusions:  PRN Meds: acetaminophen  **OR** acetaminophen   Allergies:   No Known  Allergies  Social History:   Social History   Socioeconomic History   Marital status: Married    Spouse name: Ezella Flint Dodson Valdemar   Number of children: 3   Years of education: 1 year university   Highest education level: Not on file  Occupational History   Occupation: unemployed  Tobacco Use   Smoking status: Former   Smokeless tobacco: Never  Vaping Use   Vaping status: Never Used  Substance and Sexual Activity   Alcohol use: Not Currently   Drug use: Never   Sexual activity: Yes  Other Topics Concern   Not on file  Social History Narrative   Lives in [place] neighborhood with wife     2 sons live locally      Social Drivers of Health   Financial Resource Strain: Medium Risk (09/23/2023)   Overall Financial Resource Strain (CARDIA)    Difficulty of Paying Living Expenses: Somewhat hard  Food Insecurity: No Food Insecurity (03/08/2024)   Hunger Vital Sign    Worried About Running Out of Food in the Last Year: Never true    Ran Out of Food in the Last Year: Never true  Transportation Needs: No Transportation Needs (03/08/2024)   PRAPARE - Administrator, Civil Service (Medical): No    Lack of Transportation (Non-Medical): No  Recent Concern: Transportation Needs - Unmet Transportation Needs (02/20/2024)   PRAPARE - Administrator, Civil Service (Medical): Yes    Lack of Transportation (Non-Medical): Yes  Physical Activity: Not on file  Stress: Not on file  Social Connections: Patient Declined (03/08/2024)   Social Connection and Isolation Panel    Frequency of Communication with Friends and Family: Patient declined    Frequency of Social Gatherings with Friends and Family: Patient declined    Attends Religious Services: Patient declined    Database administrator or Organizations: Patient declined    Attends Banker Meetings: Patient declined    Marital Status: Patient declined  Recent Concern: Social Connections -  Socially Isolated (02/20/2024)   Social Connection and Isolation Panel    Frequency of Communication with Friends and Family: Never    Frequency of Social Gatherings with Friends and Family: Never    Attends Religious Services: Never    Database administrator or Organizations: No    Attends Banker Meetings: Never    Marital Status: Never married  Intimate Partner Violence: Not At Risk (03/08/2024)   Humiliation, Afraid, Rape, and Kick questionnaire    Fear of Current or Ex-Partner: No    Emotionally Abused: No    Physically Abused: No    Sexually Abused: No    Family History:   Family History  Problem Relation Age of Onset  Heart disease Mother        cause of death--CHF?   Diabetes Father    Kidney disease Father        Dialysis for kidney failure   Diabetes Sister    Diabetes Brother    Colon cancer Neg Hx    Stomach cancer Neg Hx    Esophageal cancer Neg Hx     ROS:  Please see the history of present illness.  All other ROS reviewed and negative.     Physical Exam/Data: Vitals:   03/17/24 0718 03/17/24 1055 03/17/24 1329 03/17/24 1555  BP: (!) 153/84 (!) 155/85 130/84 131/70  Pulse: 97 97 97 93  Resp: 12 17 18 18   Temp:  98.7 F (37.1 C) 98.2 F (36.8 C) 97.8 F (36.6 C)  TempSrc:  Oral Oral Oral  SpO2: 98% 99% 100% 99%  Weight:      Height:       No intake or output data in the 24 hours ending 03/17/24 1616    03/16/2024    5:38 PM 03/12/2024    8:25 AM 03/12/2024    4:26 AM  Last 3 Weights  Weight (lbs) 154 lb 154 lb 12.2 oz 156 lb 4.8 oz  Weight (kg) 69.854 kg 70.2 kg 70.897 kg     Body mass index is 28.17 kg/m.   General:   in no acute distress HEENT: normal Neck: no JVD Vascular: No carotid bruits; Distal pulses 2+ bilaterally Cardiac:  normal S1, S2; RRR; no murmur  Lungs:  clear to auscultation bilaterally, no wheezing, rhonchi or rales  Abd: soft, nontender, no hepatomegaly  Ext: no edema Musculoskeletal:  No deformities Skin:  warm and dry  Neuro: no focal abnormalities noted Psych:  Normal affect   Telemetry :  The telemetry was personally reviewed and demonstrates:  primarily sinus rhythm, no significant pauses noted on telemetry thus far, but 5-6 second pauses on ZIO live monitor, retrieved in app   Relevant CV Studies:  Echocardiogram, 02/22/2024 Left ventricular ejection fraction, by estimation, is 50% . The left ventricle has low normal function. Left ventricular endocardial border not optimally defined to evaluate regional wall motion. There is mild left ventricular hypertrophy.  Right ventricular systolic function is normal. The right ventricular size is mildly enlarged. There is mildly elevated pulmonary artery systolic pressure. The estimated right ventricular systolic pressure is 37. 5 mmHg.  Left atrial size was mildly dilated.  Right atrial size was mildly dilated.  The mitral valve is degenerative. Mild mitral valve regurgitation.  The aortic valve is tricuspid. There is mild calcification of the aortic valve. There is mild thickening of the aortic valve.  The inferior vena cava is dilated in size with < 50% respiratory variability, suggesting right atrial pressure of 15 mmHg.  Laboratory Data: High Sensitivity Troponin:   Recent Labs  Lab 02/20/24 1340 02/20/24 1707 03/08/24 1232  TROPONINIHS 25* 25* 35*     Chemistry Recent Labs  Lab 03/12/24 0855 03/16/24 2140 03/17/24 0603  NA 131* 133* 134*  K 5.2* 4.6 4.8  CL 91* 92* 93*  CO2 26 28 27   GLUCOSE 132* 103* 117*  BUN 70* 49* 52*  CREATININE 7.87* 5.98* 6.67*  CALCIUM  8.6* 8.8* 8.9  MG  --   --  2.3  GFRNONAA 7* 10* 8*  ANIONGAP 14 13 14     Recent Labs  Lab 03/12/24 0855  ALBUMIN  3.5   Lipids No results for input(s): CHOL, TRIG, HDL, LABVLDL, LDLCALC, CHOLHDL  in the last 168 hours.  Hematology Recent Labs  Lab 03/12/24 0855 03/16/24 2140 03/17/24 0603  WBC 6.7 5.7 5.5  RBC 2.75* 2.92* 2.73*  HGB 8.8* 9.4*  9.0*  HCT 26.9* 29.1* 27.1*  MCV 97.8 99.7 99.3  MCH 32.0 32.2 33.0  MCHC 32.7 32.3 33.2  RDW 15.4 15.1 15.5  PLT 116* 143* 149*   Thyroid  Recent Labs  Lab 03/11/24 0432 03/11/24 0433  TSH 11.820*  --   FREET4  --  1.08    BNPNo results for input(s): BNP, PROBNP in the last 168 hours.  DDimer No results for input(s): DDIMER in the last 168 hours.  Radiology/Studies:  DG Chest Port 1 View Result Date: 03/16/2024 CLINICAL DATA:  Shortness of breath and bleeding from dialysis fistula following completion of dialysis 1 hour previous. EXAM: PORTABLE CHEST 1 VIEW COMPARISON:  Portable chest 03/08/2024. FINDINGS: The heart is moderately enlarged but unchanged. No vascular congestion is seen. The mediastinum is normally outlined. There is calcification in the transverse aorta. There is scattered linear scarring or atelectasis in the lung fields and minimal right pleural effusion. The lungs are otherwise clear. An electronic device overlies the left mid chest. There is osteopenia and degenerative change of the spine. A 4 level lower cervical ACDF plating is again shown. IMPRESSION: 1. Stable cardiomegaly. No vascular congestion. 2. Scattered linear scarring or atelectasis in the lung fields and minimal right pleural effusion. 3. Aortic atherosclerosis. Electronically Signed   By: Francis Quam M.D.   On: 03/16/2024 21:40   Assessment and Plan:  Multiple arrhythmias Tachy-brady syndrome Significant pauses  Found to be in atrial flutter, atrial fibrillation, SVT, NSVT, junctional bradycardia, sinus bradycardia Typically in the setting of profound hyperkalemia Frequent pauses, on his lab ZIO monitor with longest one being 6.5 seconds on 03/14/2024 at ~6 PM Contacted about the pause listed above, instructed to come to the emergency department for hemodialysis and EP evaluation as the patient was symptomatic during this, patient did not present to ED at that time  Speaking with the patient, he  tells me that he never had any symptoms  His preliminary ZIO report shows at least 2 significant pauses, 5.7 and 6.5 seconds Not on any AV nodal blocking agents Previously noted that most arrhythmias were in the setting of hyperkalemia, unsure if this is something that could be managed with altering of dialysis Will likely need to be seen by EP to determine if candidate for pacer -- will reach out to our EP team Previously on Eliquis  5 mg BID -- held in the setting of bleeding from AV fistula site, would advise holding until seen by EP and determined if candidate for pacemaker   Per primary ESRD on HD MWF Electrolyte disturbances  Complication with LUE AV fistula site Hypothyroidism CHF Type 2 diabetes Hyperlipidemia Anemia of chronic disease Chronic thrombocytopenia  Risk Assessment/Risk Scores:     New York  Heart Association (NYHA) Functional Class NYHA Class I  CHA2DS2-VASc Score = 5   This indicates a 7.2% annual risk of stroke. The patient's score is based upon: CHF History: 1 (? EF 35-40 by echo 02/2024, possibly affected by tachycardia) HTN History: 1 Diabetes History: 1 Stroke History: 0 Vascular Disease History: 1 (aortic atherosclerosis) Age Score: 1 Gender Score: 0      For questions or updates, please contact [facility] Please consult [email or web address] for contact info under    Signed, Waddell DELENA Donath, PA-C  03/17/2024 4:16 PM

## 2024-03-17 NOTE — ED Notes (Signed)
 Pt ambulated to restroom with walker. Sheets changed gown provided.

## 2024-03-17 NOTE — ED Notes (Signed)
 Patient declined lunch at this time, tray placed on stretcher to move with him upstairs, no meds give d/t patient not wanting to eat at this time.

## 2024-03-17 NOTE — Hospital Course (Signed)
 68 y.o. M with ESRD on HD MWF, DM, HTN, gastroparesis, AAA, dCHF, hypothyroidism, and CAD recently admitted for hyperkalemic emergency who returned with pre-syncope and bleeding from fistula.  Family reported also that they had received an alert from his Zio patch, but were uncertain what it was.

## 2024-03-17 NOTE — ED Notes (Signed)
 Received report from previous RN. Pt resting, no needs voiced. Call bell within reach.

## 2024-03-17 NOTE — Progress Notes (Signed)
    Patient: Ryan Wells FMW:[id number] DOB: 03/05/1956      Brief hospital course: 68 y.o. M with ESRD on HD MWF, DM, HTN, gastroparesis, AAA, dCHF, hypothyroidism, and CAD recently admitted for hyperkalemic emergency who returned with pre-syncope and bleeding from fistula.  Family reported also that they had received an alert from his Zio patch, but were uncertain what it was.    This is a no charge note, for further details, please see the H&P by my partner, Dr. Alfornia from earlier today.   Principal Problem:   Pre-syncope Active Problems:   Hypothyroidism   Diabetic gastroparesis (HCC)   Anemia due to chronic kidney disease   Primary hypertension   Type 2 diabetes mellitus (HCC)   ESRD on dialysis (HCC)   Atypical atrial flutter (HCC)   Chronic diastolic CHF (congestive heart failure) (HCC)           Physical Exam: BP 130/84 (BP Location: Right Arm)   Pulse 97   Temp 98.2 F (36.8 C) (Oral)   Resp 18   Ht 5' 2 (1.575 m)   Wt 69.9 kg   SpO2 100%   BMI 28.17 kg/m   Patient seen and examined.     Family Communication: None present        Author: Lonni SHAUNNA Dalton, MD 03/17/2024 2:26 PM

## 2024-03-17 NOTE — Plan of Care (Signed)
  Problem: Education: Goal: Ability to describe self-care measures that may prevent or decrease complications (Diabetes Survival Skills Education) will improve Outcome: Progressing   Problem: Tissue Perfusion: Goal: Adequacy of tissue perfusion will improve Outcome: Progressing   Problem: Education: Goal: Knowledge of General Education information will improve Description: Including pain rating scale, medication(s)/side effects and non-pharmacologic comfort measures Outcome: Progressing

## 2024-03-18 ENCOUNTER — Other Ambulatory Visit: Payer: Self-pay

## 2024-03-18 DIAGNOSIS — T82838A Hemorrhage of vascular prosthetic devices, implants and grafts, initial encounter: Secondary | ICD-10-CM | POA: Diagnosis present

## 2024-03-18 DIAGNOSIS — I455 Other specified heart block: Secondary | ICD-10-CM | POA: Diagnosis not present

## 2024-03-18 DIAGNOSIS — R079 Chest pain, unspecified: Secondary | ICD-10-CM

## 2024-03-18 DIAGNOSIS — I4891 Unspecified atrial fibrillation: Secondary | ICD-10-CM | POA: Diagnosis not present

## 2024-03-18 DIAGNOSIS — N186 End stage renal disease: Secondary | ICD-10-CM | POA: Diagnosis present

## 2024-03-18 DIAGNOSIS — I495 Sick sinus syndrome: Secondary | ICD-10-CM | POA: Diagnosis present

## 2024-03-18 DIAGNOSIS — D696 Thrombocytopenia, unspecified: Secondary | ICD-10-CM | POA: Diagnosis present

## 2024-03-18 DIAGNOSIS — E875 Hyperkalemia: Secondary | ICD-10-CM | POA: Diagnosis present

## 2024-03-18 DIAGNOSIS — I2510 Atherosclerotic heart disease of native coronary artery without angina pectoris: Secondary | ICD-10-CM | POA: Diagnosis present

## 2024-03-18 DIAGNOSIS — E785 Hyperlipidemia, unspecified: Secondary | ICD-10-CM | POA: Diagnosis present

## 2024-03-18 DIAGNOSIS — Z87891 Personal history of nicotine dependence: Secondary | ICD-10-CM | POA: Diagnosis not present

## 2024-03-18 DIAGNOSIS — Z603 Acculturation difficulty: Secondary | ICD-10-CM | POA: Diagnosis present

## 2024-03-18 DIAGNOSIS — I471 Supraventricular tachycardia, unspecified: Secondary | ICD-10-CM

## 2024-03-18 DIAGNOSIS — I5032 Chronic diastolic (congestive) heart failure: Secondary | ICD-10-CM | POA: Diagnosis present

## 2024-03-18 DIAGNOSIS — D631 Anemia in chronic kidney disease: Secondary | ICD-10-CM | POA: Diagnosis present

## 2024-03-18 DIAGNOSIS — Y841 Kidney dialysis as the cause of abnormal reaction of the patient, or of later complication, without mention of misadventure at the time of the procedure: Secondary | ICD-10-CM | POA: Diagnosis present

## 2024-03-18 DIAGNOSIS — Z992 Dependence on renal dialysis: Secondary | ICD-10-CM | POA: Diagnosis not present

## 2024-03-18 DIAGNOSIS — R001 Bradycardia, unspecified: Secondary | ICD-10-CM | POA: Diagnosis not present

## 2024-03-18 DIAGNOSIS — I132 Hypertensive heart and chronic kidney disease with heart failure and with stage 5 chronic kidney disease, or end stage renal disease: Secondary | ICD-10-CM | POA: Diagnosis present

## 2024-03-18 DIAGNOSIS — Z79890 Hormone replacement therapy (postmenopausal): Secondary | ICD-10-CM | POA: Diagnosis not present

## 2024-03-18 DIAGNOSIS — I714 Abdominal aortic aneurysm, without rupture, unspecified: Secondary | ICD-10-CM | POA: Diagnosis present

## 2024-03-18 DIAGNOSIS — I484 Atypical atrial flutter: Secondary | ICD-10-CM | POA: Diagnosis not present

## 2024-03-18 DIAGNOSIS — Z79899 Other long term (current) drug therapy: Secondary | ICD-10-CM | POA: Diagnosis not present

## 2024-03-18 DIAGNOSIS — R000 Tachycardia, unspecified: Secondary | ICD-10-CM | POA: Diagnosis present

## 2024-03-18 DIAGNOSIS — Z7901 Long term (current) use of anticoagulants: Secondary | ICD-10-CM | POA: Diagnosis not present

## 2024-03-18 DIAGNOSIS — Z8249 Family history of ischemic heart disease and other diseases of the circulatory system: Secondary | ICD-10-CM | POA: Diagnosis not present

## 2024-03-18 DIAGNOSIS — Z833 Family history of diabetes mellitus: Secondary | ICD-10-CM | POA: Diagnosis not present

## 2024-03-18 DIAGNOSIS — E1122 Type 2 diabetes mellitus with diabetic chronic kidney disease: Secondary | ICD-10-CM | POA: Diagnosis present

## 2024-03-18 DIAGNOSIS — R55 Syncope and collapse: Secondary | ICD-10-CM | POA: Diagnosis not present

## 2024-03-18 DIAGNOSIS — E039 Hypothyroidism, unspecified: Secondary | ICD-10-CM | POA: Diagnosis present

## 2024-03-18 LAB — BASIC METABOLIC PANEL WITH GFR
Anion gap: 16 — ABNORMAL HIGH (ref 5–15)
BUN: 69 mg/dL — ABNORMAL HIGH (ref 8–23)
CO2: 25 mmol/L (ref 22–32)
Calcium: 8.8 mg/dL — ABNORMAL LOW (ref 8.9–10.3)
Chloride: 91 mmol/L — ABNORMAL LOW (ref 98–111)
Creatinine, Ser: 7.79 mg/dL — ABNORMAL HIGH (ref 0.61–1.24)
GFR, Estimated: 7 mL/min — ABNORMAL LOW (ref >60)
Glucose, Bld: 101 mg/dL — ABNORMAL HIGH (ref 70–99)
Potassium: 6 mmol/L — ABNORMAL HIGH (ref 3.5–5.1)
Sodium: 132 mmol/L — ABNORMAL LOW (ref 135–145)

## 2024-03-18 LAB — CBC
HCT: 26.6 % — ABNORMAL LOW (ref 39.0–52.0)
Hemoglobin: 8.8 g/dL — ABNORMAL LOW (ref 13.0–17.0)
MCH: 32.1 pg (ref 26.0–34.0)
MCHC: 33.1 g/dL (ref 30.0–36.0)
MCV: 97.1 fL (ref 80.0–100.0)
Platelets: 155 K/uL (ref 150–400)
RBC: 2.74 MIL/uL — ABNORMAL LOW (ref 4.22–5.81)
RDW: 15.3 % (ref 11.5–15.5)
WBC: 7.1 K/uL (ref 4.0–10.5)
nRBC: 0 % (ref 0.0–0.2)

## 2024-03-18 LAB — GLUCOSE, CAPILLARY
Glucose-Capillary: 101 mg/dL — ABNORMAL HIGH (ref 70–99)
Glucose-Capillary: 121 mg/dL — ABNORMAL HIGH (ref 70–99)
Glucose-Capillary: 132 mg/dL — ABNORMAL HIGH (ref 70–99)
Glucose-Capillary: 219 mg/dL — ABNORMAL HIGH (ref 70–99)
Glucose-Capillary: 83 mg/dL (ref 70–99)

## 2024-03-18 LAB — MAGNESIUM: Magnesium: 2.4 mg/dL (ref 1.7–2.4)

## 2024-03-18 MED ORDER — NEPRO/CARBSTEADY PO LIQD: 237.0000 mL | ORAL | Status: DC | PRN

## 2024-03-18 MED ORDER — HEPARIN SODIUM (PORCINE) 1000 UNIT/ML DIALYSIS: 2200.0000 [IU] | Freq: Once | INTRAMUSCULAR | Status: DC

## 2024-03-18 MED ORDER — HEPARIN SODIUM (PORCINE) 1000 UNIT/ML DIALYSIS: 1000.0000 [IU] | INTRAMUSCULAR | Status: DC | PRN

## 2024-03-18 MED ORDER — INSULIN ASPART 100 UNIT/ML IJ SOLN
5.0000 [IU] | Freq: Once | INTRAMUSCULAR | Status: AC
  Administered 2024-03-18: 5 [IU] via INTRAVENOUS

## 2024-03-18 MED ORDER — PENTAFLUOROPROP-TETRAFLUOROETH EX AERO: 1.0000 | INHALATION_SPRAY | CUTANEOUS | Status: DC | PRN

## 2024-03-18 MED ORDER — SODIUM ZIRCONIUM CYCLOSILICATE 10 G PO PACK
10.0000 g | PACK | Freq: Once | ORAL | Status: AC
  Administered 2024-03-18: 10 g via ORAL
  Filled 2024-03-18: qty 1

## 2024-03-18 MED ORDER — ANTICOAGULANT SODIUM CITRATE 4% (200MG/5ML) IV SOLN: 5.0000 mL | Status: DC | PRN

## 2024-03-18 MED ORDER — AMIODARONE HCL 200 MG PO TABS
400.0000 mg | ORAL_TABLET | Freq: Two times a day (BID) | ORAL | Status: DC
  Administered 2024-03-18 – 2024-03-19 (×3): 400 mg via ORAL
  Filled 2024-03-18 (×3): qty 2

## 2024-03-18 MED ORDER — CHLORHEXIDINE GLUCONATE CLOTH 2 % EX PADS
6.0000 | MEDICATED_PAD | Freq: Every day | CUTANEOUS | Status: DC
  Administered 2024-03-19: 6 via TOPICAL

## 2024-03-18 MED ORDER — DEXTROSE 50 % IV SOLN
1.0000 | Freq: Once | INTRAVENOUS | Status: AC
Start: 2024-03-18 — End: 2024-03-18
  Administered 2024-03-18: 50 mL via INTRAVENOUS
  Filled 2024-03-18: qty 50

## 2024-03-18 MED ORDER — CALCIUM GLUCONATE-NACL 1-0.675 GM/50ML-% IV SOLN
1.0000 g | Freq: Once | INTRAVENOUS | Status: AC
  Administered 2024-03-18: 1000 mg via INTRAVENOUS
  Filled 2024-03-18: qty 50

## 2024-03-18 NOTE — Progress Notes (Signed)
 Mobility Specialist Progress Note:    03/18/24 1131  Therapy Vitals  Temp 98.3 F (36.8 C)  Temp Source Oral  Resp 18  Patient Position (if appropriate) Lying  Mobility  Activity Ambulated with assistance  Level of Assistance Standby assist, set-up cues, supervision of patient - no hands on  Assistive Device Front wheel walker  Distance Ambulated (ft) 150 ft  Activity Response Tolerated well  Mobility Referral Yes  Mobility visit 1 Mobility  Mobility Specialist Start Time (ACUTE ONLY) 1131  Mobility Specialist Stop Time (ACUTE ONLY) 1140  Mobility Specialist Time Calculation (min) (ACUTE ONLY) 9 min   Pt agreeable to session. Pt able to move and ambulate w/o assist. No c/o any symptoms. Left pt in bed w/ all needs met.  Venetia Keel Mobility Specialist Please Neurosurgeon or Rehab Office at [phone]

## 2024-03-18 NOTE — Progress Notes (Deleted)
  Cardiology Office Note   Date:  03/18/2024  ID:  Ryan Wells, Ryan Wells 02/06/1956, MRN [id number] PCP: Adella Norris, MD  [facility] Providers Cardiologist:  Lonni LITTIE Nanas, MD   History of Present Illness Ryan Wells is a 68 y.o. male with a past medical history of  Atrial Flutter, bradycardia  Developed atrial flutter vs SVT during dialysis in 02/2024. Also had sinus bradycardia, junctional bradycardia, idioventricular rhythm, transient pauses, NSVT, SVT noted on tele. These occurred in the setting of hyperkalemia. Once potassium corrected, rhythms were normal  Formal read from zio patch in 03/2024 pending, but detected frequent episodes of atrial fibrillation, atrial flutter, frequent ectopy, some brief NSVT, as well as some significant pauses, the longest being 6.5 seconds  HFimpEF  ESRD on HD  HTN  Type 2 DM, diabetic gastroparesis Prior psoas abscess with MSSA bacteremia  Anemia of chronic disease, thrombocytopenia  AAA Hypothyroidism   ROS: ***  Studies Reviewed      *** Risk Assessment/Calculations {Does this patient have ATRIAL FIBRILLATION?:[phone]}         Physical Exam VS:  There were no vitals taken for this visit.       Wt Readings from Last 3 Encounters:  03/18/24 159 lb 14.4 oz (72.5 kg)  03/12/24 154 lb 12.2 oz (70.2 kg)  02/23/24 146 lb 13.2 oz (66.6 kg)    GEN: Well nourished, well developed in no acute distress NECK: No JVD; No carotid bruits CARDIAC: ***RRR, no murmurs, rubs, gallops RESPIRATORY:  Clear to auscultation without rales, wheezing or rhonchi  ABDOMEN: Soft, non-tender, non-distended EXTREMITIES:  No edema; No deformity   ASSESSMENT AND PLAN ***    {Are you ordering a CV Procedure (e.g. stress test, cath, DCCV, TEE, etc)?   Press F2        :[id number]}  Dispo: ***  Signed, Rollo FABIENE Louder, PA-C

## 2024-03-18 NOTE — Procedures (Signed)
 I was present at the procedure, reviewed the HD regimen and made appropriate changes.   Myer Fret MD  [facility] 03/18/2024, 5:10 PM

## 2024-03-18 NOTE — Consult Note (Addendum)
 Renal Service Consult Note [facility]  Ryan Wells 03/18/2024 Ryan [name], MD Requesting Physician: Dr. Jonel  Reason for Consult: ESRD patient with HPI: The patient is a 68 y.o. year-old w/ PMH as below who presented to ED on 8/05 with oozing of blood from his left arm AV fistula ongoing since his last dialysis on Monday 8/04.  In ED bleeding stopped after placement of Surgicel dressing.  EKG showed sinus tach, labs showed Hgb 9.4, potassium 4.6, bicarb 28.  Chest x-ray showed stable cardiomegaly and no edema.  Hospitalist team admitted the patient and cardiology was consulted.  We are asked to see for dialysis.   Pt seen in room. Pt has no c/o's at this time. He states when asked about bleeding from the AVF that this has been going on for about 2 months total, it does not happen every time though.   Pt was admitted in mid July and dx'd with narrow complex tachycardia, likely atypical atrial flutter, rx'd w/ cardizem  drip then transitioned to metoprolol  po. Required ICU and pressors. At the end pt had side effects to cardizem  (bradycardia/ hypotension) and he was discharged without rate controlling agents.  The heparin  was transitioned to Eliquis  and he was to follow-up in A-fib clinic.  EF was 35 to 40% initially with repeat echo showing 50% EF.   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  Did not get outpatient stress testing following hospitalization.  No chest pain since   Chronic kidney disease    on dialysis Tues, Thurs and Sat   Diabetes mellitus without complication (HCC)    Diabetes type 2, uncontrolled 11/25/2011   no meds, diet controlled per patient    Diabetic gastroparesis (HCC) 01/22/2017   Hypertension    no meds   Microalbuminuria 01/19/2017   Myocardial infarction [facility]) 2015   Past Surgical History  Past Surgical History:  Procedure Laterality Date   A/V FISTULAGRAM Left 06/04/2019   Procedure: A/V FISTULAGRAM;  Surgeon: Eliza Lonni RAMAN, MD;  Location: [facility] INVASIVE CV LAB;  Service: Cardiovascular;  Laterality: Left;   A/V FISTULAGRAM Left 10/15/2019   Procedure: A/V FISTULAGRAM;  Surgeon: Eliza Lonni RAMAN, MD;  Location: [facility] INVASIVE CV LAB;  Service: Cardiovascular;  Laterality: Left;   A/V FISTULAGRAM N/A 01/24/2021   Procedure: A/V FISTULAGRAM - Left Upper;  Surgeon: Magda Debby SAILOR, MD;  Location: [facility] INVASIVE CV LAB;  Service: Cardiovascular;  Laterality: N/A;   AV FISTULA PLACEMENT Left 07/02/2018   Procedure: BRACHIOCEPHALIC ARTERIOVENOUS (AV) FISTULA CREATION LEFT ARM;  Surgeon: Serene Gaile ORN, MD;  Location: [facility] OR;  Service: Vascular;  Laterality: Left;   BASCILIC VEIN TRANSPOSITION Left 07/26/2019   Procedure: Bascilic Vein Transposition;  Surgeon: Eliza Lonni RAMAN, MD;  Location: [facility] OR;  Service: Vascular;  Laterality: Left;   COLONOSCOPY     EMBOLIZATION (CATH LAB) Left 06/04/2019   Procedure: EMBOLIZATION;  Surgeon: Eliza Lonni RAMAN, MD;  Location: [facility] INVASIVE CV LAB;  Service: Cardiovascular;  Laterality: Left;  LT ARM FISTULA/COMPETING BRANCH   EYE SURGERY Bilateral    cataracts x2   EYE SURGERY     INSERTION OF DIALYSIS CATHETER  Right 06/08/2019   Procedure: INSERTION OF PALINDROME DIALYSIS CATHETER IN RIGHT INTERNAL JUGULAR;  Surgeon: Eliza Lonni RAMAN, MD;  Location: [facility] OR;  Service: Vascular;  Laterality: Right;   LIGATION OF ARTERIOVENOUS  FISTULA Left 07/26/2019   Procedure: Ligation Of BrachioCephalic  Fistula;  Surgeon: Eliza Lonni RAMAN, MD;  Location: [facility] OR;  Service: Vascular;  Laterality: Left;   NECK SURGERY     PERIPHERAL VASCULAR BALLOON ANGIOPLASTY Left 06/04/2019    Procedure: PERIPHERAL VASCULAR BALLOON ANGIOPLASTY;  Surgeon: Eliza Lonni RAMAN, MD;  Location: [facility] INVASIVE CV LAB;  Service: Cardiovascular;  Laterality: Left;  ARM FISTULA   PERIPHERAL VASCULAR BALLOON ANGIOPLASTY Left 10/15/2019   Procedure: PERIPHERAL VASCULAR BALLOON ANGIOPLASTY;  Surgeon: Eliza Lonni RAMAN, MD;  Location: [facility] INVASIVE CV LAB;  Service: Cardiovascular;  Laterality: Left;  arm fistula   TEE WITHOUT CARDIOVERSION N/A 01/19/2021   Procedure: TRANSESOPHAGEAL ECHOCARDIOGRAM (TEE);  Surgeon: Jeffrie Oneil BROCKS, MD;  Location: [facility] ENDOSCOPY;  Service: Cardiovascular;  Laterality: N/A;   Family History  Family History  Problem Relation Age of Onset   Heart disease Mother        cause of death--CHF?   Diabetes Father    Kidney disease Father        Dialysis for kidney failure   Diabetes Sister    Diabetes Brother    Colon cancer Neg Hx    Stomach cancer Neg Hx    Esophageal cancer Neg Hx    Social History  reports that he has quit smoking. He has never used smokeless tobacco. He reports that he does not currently use alcohol. He reports that he does not use drugs. Allergies No Known Allergies Home medications Prior to Admission medications   Medication Sig Start Date End Date Taking? Authorizing Provider  apixaban  (ELIQUIS ) 5 MG TABS tablet Take 1 tablet (5 mg total) by mouth 2 (two) times daily. 02/23/24 03/24/24 Yes Kc, Mennie, MD  atorvastatin  (LIPITOR) 20 MG tablet TAKE 1 TABLET BY MOUTH DAILY WITH THE EVENING MEAL Patient taking differently: Take 20 mg by mouth at bedtime. 05/23/23  Yes Adella Norris, MD  B Complex-C-Folic Acid  (DIALYVITE 800) 0.8 MG TABS Take 1 tablet by mouth at bedtime.   Yes [provider]  levothyroxine  (SYNTHROID ) 88 MCG tablet TAKE 1 TABLET(88 MCG) BY MOUTH DAILY Patient taking differently: Take 88 mcg by mouth at bedtime. 05/23/23  Yes Adella Norris, MD  sevelamer  carbonate (RENVELA ) 800 MG tablet Take 1,600 mg by mouth  with breakfast, with lunch, and with evening meal. 01/09/24  Yes [provider]  sodium zirconium cyclosilicate  (LOKELMA ) 5 g packet Take 5 g by mouth daily. 03/12/24 04/11/24 Yes Krishnan, Gokul, MD  acetaminophen  (TYLENOL ) 325 MG tablet Take 2 tablets (650 mg total) by mouth every 6 (six) hours as needed for mild pain (or Fever >/= 101). 04/07/21   Raenelle Coria, MD     Vitals:   03/18/24 0013 03/18/24 0331 03/18/24 0744 03/18/24 1131  BP: 114/70 (!) 111/55 101/89   Pulse: 92 62    Resp: 18 18 18 18   Temp: 98.9 F (37.2 C) 98.8 F (37.1 C) 98.5 F (36.9 C) 98.3 F (36.8 C)  TempSrc: Oral Oral Oral Oral  SpO2: 100% 100%    Weight:  72.5 kg    Height:       Exam Gen alert, no distress No rash, cyanosis or gangrene Sclera anicteric, throat clear  No jvd or bruits Chest clear bilat to bases, no rales/ wheezing  RRR no MRG Abd soft ntnd no mass or ascites +bs GU deferred MS no joint effusions or deformity Ext no LE or UE edema, no other edema Neuro is alert, Ox 3 , nf    LUA AVF + bruit  Relevant home meds: Eliquis  5 bid Dialyvite qd Renvela  2 ac tid   OP HD: [place] [facility] 3h   B400   66.7kg  2K bath   AVF   Heparin  2200 Last OP HD 8/4, post wt 71kg (3-4kg up) Gets to dry wt 50% of the time, good compliance CXR - stable CM, no edema/ congestion   Assessment/ Plan: Bleeding from AVF: oozing blood from L AVF after HD Monday. Stopped in ED w/ surgicel dressing. Is on full-dose eliquis . Will dc his heparin  with dialysis and see if this helps w/ the post hd bleeding (eliquis  was added to his home meds about 3 wks ago). If not could consider fistulogram vs lower eliquis  to lower dose (2.5 bid).  SVT/ bradycardia/ atypical atrial flutter: being seen by EP team.  ESRD: on HD MWF. Missed HD yesterday. Plan HD today off schedule.  BP: bp's are wnl here, not on bp lowering agents at home.   Volume: looks euvolemic on exam, CXR no edema or congestion. 5kg up by wt. Goal UF  3 L w/ HD.  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  Chronic CHF: last EF 50% in July 2025.  DM2       Myer Fret  MD [facility] 03/18/2024, 2:19 PM  Recent Labs  Lab 03/12/24 0500 03/12/24 0855 03/16/24 2140 03/17/24 0603 03/18/24 0242  HGB  --  8.8*   < > 9.0* 8.8*  ALBUMIN   --  3.5  --   --   --   CALCIUM   --  8.6*   < > 8.9 8.8*  PHOS 5.3* 5.2*  --   --   --   CREATININE  --  7.87*   < > 6.67* 7.79*  K  --  5.2*   < > 4.8 6.0*   < > = values in this interval not displayed.   Inpatient medications:  amiodarone   400 mg Oral BID   atorvastatin   20 mg Oral QHS   insulin  aspart  0-5 Units Subcutaneous QHS   insulin  aspart  0-6 Units Subcutaneous TID WC   levothyroxine   88 mcg Oral QAC breakfast   multivitamin  1 tablet Oral QHS   sevelamer  carbonate  1,600 mg Oral TID with meals   sodium zirconium cyclosilicate   5 g Oral Daily    diltiazem  (CARDIZEM ) infusion 5 mg/hr (03/17/24 1911)   acetaminophen  **OR** acetaminophen 

## 2024-03-18 NOTE — Progress Notes (Signed)
 DAILY PROGRESS NOTE   Patient Name: Ryan Wells Date of Encounter: 03/18/2024 Cardiologist: Lonni LITTIE Nanas, MD  Chief Complaint   No complaints  Patient Profile   Ryan Wells is a 68 y.o. male with a hx of ESRD on HD on MWF, hypertension, diabetes, prior psoas abscess with MSSA bacteremia, atrial flutter, bradycardia, syncope, anemia of chronic disease, thrombocytopenia, AAA, diabetic gastroparesis, hypothyroidism, who is being seen 03/17/2024 for the evaluation of atrial flutter vs SVT at the request of Dr. Jonel.   Subjective   No complaints overnight -remains on low dose IV diltiazem  at 5 mg/hr - now in sinus with atrial bigeminy. Asymptomatic. Plans for EP eval today. He is also scheduled for dialysis today.  Objective   Vitals:   03/17/24 1959 03/18/24 0013 03/18/24 0331 03/18/24 0744  BP: 132/76 114/70 (!) 111/55 101/89  Pulse: 93 92 62   Resp: 18 18 18 18   Temp: 98.9 F (37.2 C) 98.9 F (37.2 C) 98.8 F (37.1 C) 98.5 F (36.9 C)  TempSrc: Oral Oral Oral Oral  SpO2: 100% 100% 100%   Weight:   72.5 kg   Height:        Intake/Output Summary (Last 24 hours) at 03/18/2024 9077 Last data filed at 03/18/2024 9185 Gross per 24 hour  Intake 558.7 ml  Output 0 ml  Net 558.7 ml   Filed Weights   03/16/24 1738 03/18/24 0331  Weight: 69.9 kg 72.5 kg    Physical Exam   General appearance: alert and no distress Lungs: clear to auscultation bilaterally Heart: regularly irregular rhythm Extremities: extremities normal, atraumatic, no cyanosis or edema and + thrill in LUE fistula Neurologic: Grossly normal  Inpatient Medications    Scheduled Meds:  atorvastatin   20 mg Oral QHS   insulin  aspart  0-5 Units Subcutaneous QHS   insulin  aspart  0-6 Units Subcutaneous TID WC   levothyroxine   88 mcg Oral QAC breakfast   multivitamin  1 tablet Oral QHS   sevelamer  carbonate  1,600 mg Oral TID with meals   sodium zirconium cyclosilicate   5 g Oral  Daily    Continuous Infusions:  diltiazem  (CARDIZEM ) infusion 5 mg/hr (03/17/24 1911)    PRN Meds: acetaminophen  **OR** acetaminophen    Labs   Results for orders placed or performed during the hospital encounter of 03/16/24 (from the past 48 hours)  Basic metabolic panel     Status: Abnormal   Collection Time: 03/16/24  9:40 PM  Result Value Ref Range   Sodium 133 (L) 135 - 145 mmol/L   Potassium 4.6 3.5 - 5.1 mmol/L   Chloride 92 (L) 98 - 111 mmol/L   CO2 28 22 - 32 mmol/L   Glucose, Bld 103 (H) 70 - 99 mg/dL    Comment: Glucose reference range applies only to samples taken after fasting for at least 8 hours.   BUN 49 (H) 8 - 23 mg/dL   Creatinine, Ser 4.01 (H) 0.61 - 1.24 mg/dL   Calcium  8.8 (L) 8.9 - 10.3 mg/dL   GFR, Estimated 10 (L) >60 mL/min    Comment: (NOTE) Calculated using the CKD-EPI Creatinine Equation (2021)    Anion gap 13 5 - 15    Comment: Performed at [facility], [address] [place], [place] [address]  CBC WITH DIFFERENTIAL     Status: Abnormal   Collection Time: 03/16/24  9:40 PM  Result Value Ref Range   WBC 5.7 4.0 - 10.5 K/uL  RBC 2.92 (L) 4.22 - 5.81 MIL/uL   Hemoglobin 9.4 (L) 13.0 - 17.0 g/dL   HCT 70.8 (L) 60.9 - 47.9 %   MCV 99.7 80.0 - 100.0 fL   MCH 32.2 26.0 - 34.0 pg   MCHC 32.3 30.0 - 36.0 g/dL   RDW 84.8 88.4 - 84.4 %   Platelets 143 (L) 150 - 400 K/uL   nRBC 0.0 0.0 - 0.2 %   Neutrophils Relative % 60 %   Neutro Abs 3.4 1.7 - 7.7 K/uL   Lymphocytes Relative 25 %   Lymphs Abs 1.4 0.7 - 4.0 K/uL   Monocytes Relative 13 %   Monocytes Absolute 0.7 0.1 - 1.0 K/uL   Eosinophils Relative 1 %   Eosinophils Absolute 0.0 0.0 - 0.5 K/uL   Basophils Relative 1 %   Basophils Absolute 0.1 0.0 - 0.1 K/uL   Immature Granulocytes 0 %   Abs Immature Granulocytes 0.01 0.00 - 0.07 K/uL    Comment: Performed at [facility], [address], [place], [place] [address]  Basic metabolic panel     Status: Abnormal   Collection  Time: 03/17/24  6:03 AM  Result Value Ref Range   Sodium 134 (L) 135 - 145 mmol/L   Potassium 4.8 3.5 - 5.1 mmol/L   Chloride 93 (L) 98 - 111 mmol/L   CO2 27 22 - 32 mmol/L   Glucose, Bld 117 (H) 70 - 99 mg/dL    Comment: Glucose reference range applies only to samples taken after fasting for at least 8 hours.   BUN 52 (H) 8 - 23 mg/dL   Creatinine, Ser 3.32 (H) 0.61 - 1.24 mg/dL   Calcium  8.9 8.9 - 10.3 mg/dL   GFR, Estimated 8 (L) >60 mL/min    Comment: (NOTE) Calculated using the CKD-EPI Creatinine Equation (2021)    Anion gap 14 5 - 15    Comment: Performed at [facility], [address], [place], [place] [address]  Magnesium      Status: None   Collection Time: 03/17/24  6:03 AM  Result Value Ref Range   Magnesium  2.3 1.7 - 2.4 mg/dL    Comment: Performed at [facility], [address] [place], [place] [address]  CBC     Status: Abnormal   Collection Time: 03/17/24  6:03 AM  Result Value Ref Range   WBC 5.5 4.0 - 10.5 K/uL   RBC 2.73 (L) 4.22 - 5.81 MIL/uL   Hemoglobin 9.0 (L) 13.0 - 17.0 g/dL   HCT 72.8 (L) 60.9 - 47.9 %   MCV 99.3 80.0 - 100.0 fL   MCH 33.0 26.0 - 34.0 pg   MCHC 33.2 30.0 - 36.0 g/dL   RDW 84.4 88.4 - 84.4 %   Platelets 149 (L) 150 - 400 K/uL   nRBC 0.0 0.0 - 0.2 %    Comment: Performed at [facility], [address], [place], [place] [address]  CBG monitoring, ED     Status: Abnormal   Collection Time: 03/17/24  7:52 AM  Result Value Ref Range   Glucose-Capillary 100 (H) 70 - 99 mg/dL    Comment: Glucose reference range applies only to samples taken after fasting for at least 8 hours.   Comment 1 Notify RN   CBG monitoring, ED     Status: Abnormal   Collection Time: 03/17/24 12:28 PM  Result Value Ref Range   Glucose-Capillary 117 (H) 70 - 99 mg/dL  Comment: Glucose reference range applies only to samples taken after fasting for at least 8 hours.  Glucose, capillary     Status: Abnormal   Collection Time: 03/17/24  3:52  PM  Result Value Ref Range   Glucose-Capillary 174 (H) 70 - 99 mg/dL    Comment: Glucose reference range applies only to samples taken after fasting for at least 8 hours.  Glucose, capillary     Status: None   Collection Time: 03/17/24  9:23 PM  Result Value Ref Range   Glucose-Capillary 99 70 - 99 mg/dL    Comment: Glucose reference range applies only to samples taken after fasting for at least 8 hours.   Comment 1 Notify RN    Comment 2 Document in Chart   CBC     Status: Abnormal   Collection Time: 03/18/24  2:42 AM  Result Value Ref Range   WBC 7.1 4.0 - 10.5 K/uL   RBC 2.74 (L) 4.22 - 5.81 MIL/uL   Hemoglobin 8.8 (L) 13.0 - 17.0 g/dL   HCT 73.3 (L) 60.9 - 47.9 %   MCV 97.1 80.0 - 100.0 fL   MCH 32.1 26.0 - 34.0 pg   MCHC 33.1 30.0 - 36.0 g/dL   RDW 84.6 88.4 - 84.4 %   Platelets 155 150 - 400 K/uL   nRBC 0.0 0.0 - 0.2 %    Comment: Performed at [facility], [address], [place], [place] [address]  Basic metabolic panel with GFR     Status: Abnormal   Collection Time: 03/18/24  2:42 AM  Result Value Ref Range   Sodium 132 (L) 135 - 145 mmol/L   Potassium 6.0 (H) 3.5 - 5.1 mmol/L   Chloride 91 (L) 98 - 111 mmol/L   CO2 25 22 - 32 mmol/L   Glucose, Bld 101 (H) 70 - 99 mg/dL    Comment: Glucose reference range applies only to samples taken after fasting for at least 8 hours.   BUN 69 (H) 8 - 23 mg/dL   Creatinine, Ser 2.20 (H) 0.61 - 1.24 mg/dL   Calcium  8.8 (L) 8.9 - 10.3 mg/dL   GFR, Estimated 7 (L) >60 mL/min    Comment: (NOTE) Calculated using the CKD-EPI Creatinine Equation (2021)    Anion gap 16 (H) 5 - 15    Comment: Performed at [facility], [address], [place], [place] [address]  Magnesium      Status: None   Collection Time: 03/18/24  2:42 AM  Result Value Ref Range   Magnesium  2.4 1.7 - 2.4 mg/dL    Comment: Performed at [facility], [address], [place], [place] [address]  Glucose, capillary     Status: Abnormal   Collection  Time: 03/18/24  5:00 AM  Result Value Ref Range   Glucose-Capillary 101 (H) 70 - 99 mg/dL    Comment: Glucose reference range applies only to samples taken after fasting for at least 8 hours.  Glucose, capillary     Status: Abnormal   Collection Time: 03/18/24  6:06 AM  Result Value Ref Range   Glucose-Capillary 121 (H) 70 - 99 mg/dL    Comment: Glucose reference range applies only to samples taken after fasting for at least 8 hours.   Comment 1 Notify RN    Comment 2 Document in Chart     ECG   Sinus to low atrial/junctional rhythm with bigeminal PAC's - Personally Reviewed  Telemetry   Sinus  rhythm with bigeminal PAC's - Personally Reviewed  Radiology    DG Chest Port 1 View Result Date: 03/16/2024 CLINICAL DATA:  Shortness of breath and bleeding from dialysis fistula following completion of dialysis 1 hour previous. EXAM: PORTABLE CHEST 1 VIEW COMPARISON:  Portable chest 03/08/2024. FINDINGS: The heart is moderately enlarged but unchanged. No vascular congestion is seen. The mediastinum is normally outlined. There is calcification in the transverse aorta. There is scattered linear scarring or atelectasis in the lung fields and minimal right pleural effusion. The lungs are otherwise clear. An electronic device overlies the left mid chest. There is osteopenia and degenerative change of the spine. A 4 level lower cervical ACDF plating is again shown. IMPRESSION: 1. Stable cardiomegaly. No vascular congestion. 2. Scattered linear scarring or atelectasis in the lung fields and minimal right pleural effusion. 3. Aortic atherosclerosis. Electronically Signed   By: Francis Quam M.D.   On: 03/16/2024 21:40    Cardiac Studies   N/A  Assessment   Principal Problem:   Pre-syncope Active Problems:   Chest pain of uncertain etiology   Hypothyroidism   Diabetic gastroparesis (HCC)   Anemia due to chronic kidney disease   Primary hypertension   Type 2 diabetes mellitus (HCC)   ESRD on  dialysis (HCC)   Atypical atrial flutter (HCC)   Chronic diastolic CHF (congestive heart failure) (HCC)   Sinus pause   History of atrial flutter   Plan   Not in afib, but appears to be in low atrial/junctional rhythm with atrial bigeminy on low dose IV diltiazem . Would continue for now - no pauses or significant bradycardia overnight. We have asked cardiac EP to evaluate today. He remains asymptomatic. Scheduled for dialysis today.  Time Spent Directly with Patient:  I have spent a total of 25 minutes with the patient reviewing hospital notes, telemetry, EKGs, labs and examining the patient as well as establishing an assessment and plan that was discussed personally with the patient.  > 50% of time was spent in direct patient care.  Length of Stay:  LOS: 0 days   Vinie KYM Maxcy, MD, [facility], FNLA, FACP  [place]  [facility]  Medical Director of the Advanced Lipid Disorders &  Cardiovascular Risk Reduction Clinic Diplomate of the American Board of Clinical Lipidology Attending Cardiologist  Direct Dial: [phone]  Fax: [phone]  Website:  [email or web address]  Vinie BROCKS Jadyn Barge 03/18/2024, 9:22 AM

## 2024-03-18 NOTE — Plan of Care (Signed)
  Problem: Education: Goal: Ability to describe self-care measures that may prevent or decrease complications (Diabetes Survival Skills Education) will improve Outcome: Progressing Goal: Individualized Educational Video(s) Outcome: Progressing   Problem: Coping: Goal: Ability to adjust to condition or change in health will improve Outcome: Progressing   Problem: Education: Goal: Individualized Educational Video(s) Outcome: Progressing   Problem: Fluid Volume: Goal: Ability to maintain a balanced intake and output will improve Outcome: Progressing

## 2024-03-18 NOTE — Plan of Care (Signed)
  Problem: Metabolic: Goal: Ability to maintain appropriate glucose levels will improve Outcome: Progressing   Problem: Clinical Measurements: Goal: Ability to maintain clinical measurements within normal limits will improve Outcome: Progressing Goal: Cardiovascular complication will be avoided Outcome: Progressing   Problem: Education: Goal: Knowledge of disease or condition will improve Outcome: Progressing Goal: Understanding of medication regimen will improve Outcome: Progressing Goal: Individualized Educational Video(s) Outcome: Progressing   Problem: Activity: Goal: Ability to tolerate increased activity will improve Outcome: Progressing   Problem: Cardiac: Goal: Ability to achieve and maintain adequate cardiopulmonary perfusion will improve Outcome: Progressing   Problem: Health Behavior/Discharge Planning: Goal: Ability to safely manage health-related needs after discharge will improve Outcome: Progressing

## 2024-03-18 NOTE — Care Management Obs Status (Signed)
 MEDICARE OBSERVATION STATUS NOTIFICATION   Patient Details  Name: Ryan Wells MRN: [id number] Date of Birth: 1955/08/13   Medicare Observation Status Notification Given:  Yes    Vonzell Arrie Sharps 03/18/2024, 8:30 AM

## 2024-03-18 NOTE — TOC CM/SW Note (Signed)
 Transition of Care [facility]) - Inpatient Brief Assessment   Patient Details  Name: SYRUS NAKAMA MRN: [id number] Date of Birth: July 26, 1956  Transition of Care [facility]) CM/SW Contact:    Waddell Barnie Rama, RN Phone Number: 03/18/2024, 4:08 PM   Clinical Narrative: From home with spouse and kids, has PCP and insurance on file, states has [facility] services in place at this time with [place], NCM confirmed with Darleene, he has HHPT.  He has a rolling walker at home.  States family member will transport them home at [employer] and family is support system, states gets medications from [facility] on [address].  Pta self ambulatory with walker.       Transition of Care Asessment: Insurance and Status: Insurance coverage has been reviewed Patient has primary care physician: Yes Home environment has been reviewed: home with wife and kids Prior level of function:: ambulatory with walker Prior/Current Home Services: Current home services (walker) Social Drivers of Health Review: SDOH reviewed no interventions necessary Readmission risk has been reviewed: Yes Transition of care needs: transition of care needs identified, TOC will continue to follow

## 2024-03-18 NOTE — Consult Note (Addendum)
 ELECTROPHYSIOLOGY CONSULT NOTE    Patient ID: Ryan Wells MRN: [id number], DOB/AGE: 69/04/1956 69 y.o.  Admit date: 03/16/2024 Date of Consult: 03/18/2024  Primary Physician: Adella Norris, MD Primary Cardiologist: Lonni LITTIE Nanas, MD  Electrophysiologist: New   Referring Provider: Dr. Mona  Patient Profile: Ryan Wells is a 68 y.o. male with a history of ESRD on HD on MWF, hypertension, diabetes, prior psoas abscess with MSSA bacteremia, atrial flutter, bradycardia, syncope, anemia of chronic disease, thrombocytopenia, AAA, diabetic gastroparesis, hypothyroidism who is being seen today for the evaluation of AFL and post conversion pauses/nocturnal bradycardia at the request of Dr. Mona.  HPI:  Ryan Wells is a 68 y.o. male with medical history as above.   He was recently hospitalized from 7/28-03/12/2024 for hyperkalemia, was noted to develop atypical atrial flutter vs SVT during dialysis.  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  It seems that last time all of these issues were primarily in the setting of hyperkalemia.   Telemetry also noted sinus bradycardia and pauses which were felt to be associated with hyperkalemia. Monitor was ordered at discharge to follow.    Admitted 8/6 with oozing from his fistula and noted to be tachycardia. Cardiology consulted given history above.  Monitor reviewed which showed paroxysms of atrial arrhythmia and a post conversion pause of > 6 seconds. EP asked to consult as well for recommendations.   Pt is without complaint today. Denies any chest pain, palpitations, dizziness, or any episodes of syncope/near syncope. He presented to ED for his fistula, not any cardiac complaint.    Labs Potassium6.0* (08/07 0242) Magnesium   2.4 (08/07 0242) Creatinine, ser  7.79* (08/07 0242) PLT  155 (08/07 0242) HGB  8.8* (08/07 0242) WBC 7.1 (08/07 0242)  .    Past Medical History:  Diagnosis Date   3rd nerve palsy, partial, right 01/03/2017   AAA (abdominal aortic aneurysm) [facility])    Not noted on CT abd 2019   Chest pain 11/2013   Normal Echo/ EKG/enzymes-hospitalized.  Did not get outpatient stress testing following hospitalization.  No chest pain since   Chronic kidney disease    on dialysis Tues, Thurs and Sat   Diabetes mellitus without complication (HCC)    Diabetes type 2, uncontrolled 11/25/2011   no meds, diet controlled per patient   Diabetic gastroparesis (HCC) 01/22/2017   Hypertension    no meds   Microalbuminuria 01/19/2017   My[facility]) 2015     Surgical History:  Past Surgical History:  Procedure Laterality Date   A/V FISTULAGRAM Left 06/04/2019   Procedure: A/V FISTULAGRAM;  Surgeon: Eliza Lonni RAMAN, MD;  Location: [facility] INVASIVE CV LAB;  Service: Cardiovascular;  Laterality: Left;   A/V FISTULAGRAM Left 10/15/2019   Procedure: A/V FISTULAGRAM;  Surgeon: Eliza Lonni RAMAN, MD;  Location: [facility] INVASIVE CV LAB;  Service: Cardiovascular;  Laterality: Left;   A/V FISTULAGRAM N/A 01/24/2021   Procedure: A/V FISTULAGRAM - Left Upper;  Surgeon: Magda Debby SAILOR, MD;  Location: [facility] INVASIVE CV LAB;  Service: Cardiovascular;  Laterality: N/A;   AV FISTULA PLACEMENT Left 07/02/2018   Procedure: BRACHIOCEPHALIC ARTERIOVENOUS (AV) FISTULA CREATION LEFT ARM;  Surgeon: Serene Gaile ORN, MD;  Location: [facility] OR;  Service: Vascular;  Laterality: Left;   BASCILIC VEIN TRANSPOSITION Left 07/26/2019   Procedure: Bascilic Vein  Transposition;  Surgeon: Eliza Lonni RAMAN, MD;  Location: [facility] OR;  Service: Vascular;  Laterality: Left;   COLONOSCOPY     EMBOLIZATION (CATH LAB) Left 06/04/2019   Procedure: EMBOLIZATION;  Surgeon: Eliza Lonni RAMAN,  MD;  Location: [facility] INVASIVE CV LAB;  Service: Cardiovascular;  Laterality: Left;  LT ARM FISTULA/COMPETING BRANCH   EYE SURGERY Bilateral    cataracts x2   EYE SURGERY     INSERTION OF DIALYSIS CATHETER Right 06/08/2019   Procedure: INSERTION OF PALINDROME DIALYSIS CATHETER IN RIGHT INTERNAL JUGULAR;  Surgeon: Eliza Lonni RAMAN, MD;  Location: [facility];  Service: Vascular;  Laterality: Right;   LIGATION OF ARTERIOVENOUS  FISTULA Left 07/26/2019   Procedure: Ligation Of BrachioCephalic  Fistula;  Surgeon: Eliza Lonni RAMAN, MD;  Location: [facility] OR;  Service: Vascular;  Laterality: Left;   NECK SURGERY     PERIPHERAL VASCULAR BALLOON ANGIOPLASTY Left 06/04/2019   Procedure: PERIPHERAL VASCULAR BALLOON ANGIOPLASTY;  Surgeon: Eliza Lonni RAMAN, MD;  Location: [facility] INVASIVE CV LAB;  Service: Cardiovascular;  Laterality: Left;  ARM FISTULA   PERIPHERAL VASCULAR BALLOON ANGIOPLASTY Left 10/15/2019   Procedure: PERIPHERAL VASCULAR BALLOON ANGIOPLASTY;  Surgeon: Eliza Lonni RAMAN, MD;  Location: [facility] INVASIVE CV LAB;  Service: Cardiovascular;  Laterality: Left;  arm fistula   TEE WITHOUT CARDIOVERSION N/A 01/19/2021   Procedure: TRANSESOPHAGEAL Ryan (TEE);  Surgeon: Jeffrie Oneil BROCKS, MD;  Location: [facility] ENDOSCOPY;  Service: Cardiovascular;  Laterality: N/A;     Medications Prior to Admission  Medication Sig Dispense Refill Last Dose/Taking   apixaban  (ELIQUIS ) 5 MG TABS tablet Take 1 tablet (5 mg total) by mouth 2 (two) times daily. 60 tablet 0 03/16/2024 at  9:00 AM   atorvastatin  (LIPITOR) 20 MG tablet TAKE 1 TABLET BY MOUTH DAILY WITH THE EVENING MEAL (Patient taking differently: Take 20 mg by mouth at bedtime.) 30 tablet 11 03/15/2024   B Complex-C-Folic Acid  (DIALYVITE 800) 0.8 MG TABS Take 1 tablet by mouth at bedtime.   03/16/2024 Morning   levothyroxine  (SYNTHROID ) 88 MCG tablet TAKE 1 TABLET(88 MCG) BY MOUTH DAILY (Patient taking differently: Take 88 mcg by mouth at bedtime.) 30 tablet 11  03/15/2024   sevelamer  carbonate (RENVELA ) 800 MG tablet Take 1,600 mg by mouth with breakfast, with lunch, and with evening meal.   03/16/2024 Morning   sodium zirconium cyclosilicate  (LOKELMA ) 5 g packet Take 5 g by mouth daily. 30 packet 0 03/15/2024   acetaminophen  (TYLENOL ) 325 MG tablet Take 2 tablets (650 mg total) by mouth every 6 (six) hours as needed for mild pain (or Fever >/= 101).       Inpatient Medications:   amiodarone   400 mg Oral BID   atorvastatin   20 mg Oral QHS   insulin  aspart  0-5 Units Subcutaneous QHS   insulin  aspart  0-6 Units Subcutaneous TID WC   levothyroxine   88 mcg Oral QAC breakfast   multivitamin  1 tablet Oral QHS   sevelamer  carbonate  1,600 mg Oral TID with meals   sodium zirconium cyclosilicate   5 g Oral Daily    Allergies: No Known Allergies  Family History  Problem Relation Age of Onset   Heart disease Mother        cause of death--CHF?   Diabetes Father    Kidney disease Father        Dialysis for kidney failure   Diabetes Sister    Diabetes Brother    Colon cancer Neg Hx    Stomach cancer Neg  Hx    Esophageal cancer Neg Hx      Physical Exam: Vitals:   03/17/24 1959 03/18/24 0013 03/18/24 0331 03/18/24 0744  BP: 132/76 114/70 (!) 111/55 101/89  Pulse: 93 92 62   Resp: 18 18 18 18   Temp: 98.9 F (37.2 C) 98.9 F (37.2 C) 98.8 F (37.1 C) 98.5 F (36.9 C)  TempSrc: Oral Oral Oral Oral  SpO2: 100% 100% 100%   Weight:   72.5 kg   Height:        GEN- NAD, A&O x 3, normal affect HEENT: Normocephalic, atraumatic Lungs- CTAB, Normal effort.  Heart- Regular rate and rhythm, No M/G/R.  GI- Soft, NT, ND.  Extremities- No clubbing, cyanosis, or edema   Radiology/Studies: DG Chest Port 1 View Result Date: 03/16/2024 CLINICAL DATA:  Shortness of breath and bleeding from dialysis fistula following completion of dialysis 1 hour previous. EXAM: PORTABLE CHEST 1 VIEW COMPARISON:  Portable chest 03/08/2024. FINDINGS: The heart is moderately  enlarged but unchanged. No vascular congestion is seen. The mediastinum is normally outlined. There is calcification in the transverse aorta. There is scattered linear scarring or atelectasis in the lung fields and minimal right pleural effusion. The lungs are otherwise clear. An electronic device overlies the left mid chest. There is osteopenia and degenerative change of the spine. A 4 level lower cervical ACDF plating is again shown. IMPRESSION: 1. Stable cardiomegaly. No vascular congestion. 2. Scattered linear scarring or atelectasis in the lung fields and minimal right pleural effusion. 3. Aortic atherosclerosis. Electronically Signed   By: Francis Quam M.D.   On: 03/16/2024 21:40   MR LUMBAR SPINE W WO CONTRAST Result Date: 03/10/2024 CLINICAL DATA:  Lumbar Wells, Ryan Wells, Ryan Wells EXAM: MRI LUMBAR SPINE WITHOUT AND WITH CONTRAST TECHNIQUE: Multiplanar and multiecho pulse sequences of the lumbar spine were obtained without and with intravenous contrast. CONTRAST:  8mL GADAVIST  GADOBUTROL  1 MMOL/ML IV SOLN COMPARISON:  MRI lumbar spine 03/16/2021. FINDINGS: Segmentation:  Standard. Alignment:  No substantial sagittal subluxation Vertebrae: Comparison 10/01/2020, interval disc collapse and progressive endplate erosive change, compatible with chronic osteomyelitis. Adjacent endplate edema and enhancement, which does appear proved since 2022. Adjacent L5 and S1 endplate edema is persistent but improved. Conus medullaris and cauda equina: Conus extends to the L2-L3 level. Conus and cauda equina appear normal. Paraspinal and other soft tissues: Equivocal trace paraspinal edema along the ventral sacral vertebral bodies. No abscess. Disc levels: T12-L1: No significant disc protrusion, foraminal stenosis, or canal stenosis. L1-L2: No significant disc protrusion, foraminal stenosis, or canal stenosis. L2-L3: Slight left eccentric disc bulging.  No significant stenosis. L3-L4: Mild disc  bulging. Bilateral facet arthropathy. Resulting mild left foraminal stenosis. Patent canal and right foramen. L4-L5: Broad disc bulge with moderate bilateral facet arthropathy. Resulting moderate bilateral foraminal stenosis. Patent canal. L5-S1: Findings of chronic discitis/osteomyelitis detailed above. Severe bilateral facet arthropathy. Resulting severe bilateral foraminal stenosis. IMPRESSION: 1. Findings compatible with chronic L5-S1 discitis/osteomyelitis. Disc edema is improved since 2022, but there has been interval disc collapse and progressive endplate erosive change. There is adjacent endplate marrow edema and enhancement, also improved since 2022. Equivocal trace paraspinal edema without abscess. 2. At L5-S1, severe bilateral foraminal stenosis. 3. At L4-L5, moderate bilateral foraminal stenosis. Electronically Signed   By: Gilmore GORMAN Molt M.D.   On: 03/10/2024 02:13   CT LUMBAR SPINE WO CONTRAST Result Date: 03/08/2024 CLINICAL DATA:  Lower back pain, cauda equina syndrome Wells. Transient bilateral lower extremity weakness. EXAM: CT LUMBAR SPINE  WITHOUT CONTRAST TECHNIQUE: Multidetector CT imaging of the lumbar spine was performed without intravenous contrast administration. Multiplanar CT image reconstructions were also generated. RADIATION DOSE REDUCTION: This exam was performed according to the departmental dose-optimization program which includes automated exposure control, adjustment of the mA and/or kV according to patient size and/or use of iterative reconstruction technique. COMPARISON:  CT pelvis 03/17/2021, MRI lumbar spine 03/16/2021. FINDINGS: Segmentation: 5 lumbar type vertebrae. Alignment: Lumbar lordosis is maintained.  No listhesis. Vertebrae: There is irregularity and sclerosis of the L5 inferior endplate and S1 superior endplate which is progressed since the 2022 CT. There is increased irregularity of the L5 inferior endplate with areas of fragmentation and erosive change.  There is vacuum disc phenomena noted within the adjacent disc space. Vertebral body heights are otherwise maintained. No additional osseous lesion. Degenerative changes and ankylosis of the sacroiliac joints. Paraspinal and other soft tissues: The paraspinal musculature is unremarkable. Mild scattered atherosclerosis of the abdominal aorta and branch vessels. Atrophic appearance of the bilateral native kidneys. There is likely cholelithiasis. Ascites noted in the pelvis with additional small in fluid more superiorly within the abdomen. Disc levels: T12-L1: No significant disc bulge. No significant spinal canal stenosis or foraminal stenosis. L1-2: Small disc bulge. Mild facet arthrosis. No significant spinal canal stenosis. Mild foraminal stenosis on the right L2-3: Vacuum disc phenomenon. Diffuse disc bulge. Bilateral facet arthrosis slightly greater on the right. No significant spinal canal stenosis. Mild foraminal stenosis on the right. L3-4: Diffuse disc bulge. Bilateral facet arthrosis and thickening of the ligamentum flavum. Mild spinal canal stenosis. Mild bilateral foraminal stenosis slightly greater on the left. L4-5: Diffuse disc bulge. Severe facet arthrosis. Thickening of the ligamentum flavum. Mild spinal canal stenosis. There is moderate bilateral foraminal stenosis. L5-S1: Endplate erosive changes as above. Small disc bulge and posterior osteophytes. Severe facet arthrosis. There is erosive change of the bilateral facets more pronounced on the left. Mild spinal canal stenosis. Moderate to severe right and moderate left foraminal stenosis. IMPRESSION: Interval progression of irregularity and sclerosis of the L5-S1 endplates. Findings likely reflect sequelae of discitis/osteomyelitis. Recommend correlation with MRI lumbar spine with and without contrast for further evaluation. Degenerative changes as above. Foraminal stenosis most pronounced on the right at L5-S1. Electronically Signed   By: Donnice Mania M.D.   On: 03/08/2024 15:01   DG Chest Portable 1 View Result Date: 03/08/2024 CLINICAL DATA:  Near syncope. EXAM: PORTABLE CHEST 1 VIEW COMPARISON:  February 20, 2024 FINDINGS: The cardiac silhouette is enlarged and unchanged in size. Low lung volumes are noted with multiple overlying cardiac lead wires and defibrillator leads. No acute infiltrate, pleural effusion or pneumothorax is identified. Postoperative changes are present within the lower cervical spine. No acute osseous abnormalities are seen. IMPRESSION: Low lung volumes without acute or active cardiopulmonary disease. Electronically Signed   By: Suzen Dials M.D.   On: 03/08/2024 13:35   Ryan LIMITED Result Date: 02/22/2024    Ryan LIMITED REPORT   Patient Name:   ORLONDO HOLYCROSS Date of Exam: 02/22/2024 Medical Rec #:  [id number]            Height:       62.0 in Accession #:    [id number]           Weight:       143.7 lb Date of Birth:  [birth year]             BSA:  1.661 m Patient Age:    68 years             BP:           144/64 mmHg Patient Gender: M                    HR:           53 bpm. Exam Location:  Inpatient Procedure: Limited Echo, Cardiac Doppler and Color Doppler (Both Spectral and            Color Flow Doppler were utilized during procedure). Indications:    atrial flutter  History:        Patient has prior history of Ryan examinations, most                 recent 02/20/2024. Risk Factors:Hypertension, Diabetes and                 Dyslipidemia.  Sonographer:    Philomena Daring Referring Phys: GAYATRI A ACHARYA IMPRESSIONS  1. Left ventricular ejection fraction, by estimation, is 50%. The left ventricle has low normal function. Left ventricular endocardial border not optimally defined to evaluate regional wall motion. There is mild left ventricular hypertrophy.  2. Right ventricular systolic function is normal. The right ventricular size is mildly enlarged. There is mildly elevated pulmonary  artery systolic pressure. The estimated right ventricular systolic pressure is 37.5 mmHg.  3. Left atrial size was mildly dilated.  4. Right atrial size was mildly dilated.  5. The mitral valve is degenerative. Mild mitral valve regurgitation.  6. The aortic valve is tricuspid. There is mild calcification of the aortic valve. There is mild thickening of the aortic valve.  7. The inferior vena cava is dilated in size with <50% respiratory variability, suggesting right atrial pressure of 15 mmHg. FINDINGS  Left Ventricle: Left ventricular ejection fraction, by estimation, is 50%. The left ventricle has low normal function. Left ventricular endocardial border not optimally defined to evaluate regional wall motion. There is mild left ventricular hypertrophy. Right Ventricle: The right ventricular size is mildly enlarged. Right ventricular systolic function is normal. There is mildly elevated pulmonary artery systolic pressure. The tricuspid regurgitant velocity is 2.37 m/s, and with an assumed right atrial pressure of 15 mmHg, the estimated right ventricular systolic pressure is 37.5 mmHg. Left Atrium: Left atrial size was mildly dilated. Right Atrium: Right atrial size was mildly dilated. Pericardium: Trivial pericardial effusion is present. Mitral Valve: The mitral valve is degenerative in appearance. Mild mitral valve regurgitation. Tricuspid Valve: The tricuspid valve is grossly normal. Tricuspid valve regurgitation is trivial. Aortic Valve: The aortic valve is tricuspid. There is mild calcification of the aortic valve. There is mild thickening of the aortic valve. Venous: The inferior vena cava is dilated in size with less than 50% respiratory variability, suggesting right atrial pressure of 15 mmHg. LEFT VENTRICLE PLAX 2D LVIDd:         4.90 cm LVIDs:         3.40 cm LV PW:         1.00 cm LV IVS:        1.10 cm LVOT diam:     1.80 cm LVOT Area:     2.54 cm  IVC IVC diam: 2.50 cm LEFT ATRIUM         Index LA  diam:    4.00 cm 2.41 cm/m  TRICUSPID VALVE TR Peak grad:   22.5 mmHg TR Vmax:  237.00 cm/s  SHUNTS Systemic Diam: 1.80 cm Soyla Merck MD Electronically signed by Soyla Merck MD Signature Date/Time: 02/22/2024/5:28:10 PM    Final    Ryan COMPLETE Result Date: 02/20/2024    Ryan REPORT   Patient Name:   RODGERS LIKES Date of Exam: 02/20/2024 Medical Rec #:  [id number]            Height:       62.0 in Accession #:    [id number]           Weight:       150.6 lb Date of Birth:  [birth year]             BSA:          1.694 m Patient Age:    68 years             BP:           139/98 mmHg Patient Gender: M                    HR:           142 bpm. Exam Location:  Inpatient Procedure: 2D Echo, Cardiac Doppler and Color Doppler (Both Spectral and Color            Flow Doppler were utilized during procedure). Indications:    Atrial Flutter I48.92  History:        Patient has prior history of Ryan examinations, most                 recent 01/16/2021. CKD, stage 4; Risk Factors:Hypertension,                 Diabetes and Dyslipidemia.  Sonographer:    Thea Norlander RCS Referring Phys: WADDELL LABOR PARCELLS IMPRESSIONS  1. Left ventricular ejection fraction, by estimation, is 35 to 40%. The left ventricle has moderately decreased function. The left ventricle demonstrates global hypokinesis. There is mild left ventricular hypertrophy. Left ventricular diastolic parameters are indeterminate.  2. Right ventricular systolic function is mildly reduced. The right ventricular size is normal. There is moderately elevated pulmonary artery systolic pressure. The estimated right ventricular systolic pressure is 48.4 mmHg.  3. The mitral valve is degenerative. Trivial mitral valve regurgitation.  4. Tricuspid valve regurgitation is moderate.  5. The aortic valve is tricuspid. Aortic valve regurgitation is not visualized. Aortic valve sclerosis/calcification is present, without any evidence of  aortic stenosis.  6. The inferior vena cava is dilated in size with <50% respiratory variability, suggesting right atrial pressure of 15 mmHg. Conclusion(s)/Recommendation(s): Technically difficult study, patient tachycardic to 140s. Would consider repeat limited echo with contrast once heart rate improved. FINDINGS  Left Ventricle: Left ventricular ejection fraction, by estimation, is 35 to 40%. The left ventricle has moderately decreased function. The left ventricle demonstrates global hypokinesis. The left ventricular internal cavity size was normal in size. There is mild left ventricular hypertrophy. Left ventricular diastolic parameters are indeterminate. Right Ventricle: The right ventricular size is normal. No increase in right ventricular wall thickness. Right ventricular systolic function is mildly reduced. There is moderately elevated pulmonary artery systolic pressure. The tricuspid regurgitant velocity is 2.89 m/s, and with an assumed right atrial pressure of 15 mmHg, the estimated right ventricular systolic pressure is 48.4 mmHg. Left Atrium: Left atrial size was normal in size. Right Atrium: Right atrial size was normal in size. Pericardium: There is no evidence of pericardial effusion. Mitral Valve: The mitral valve is degenerative  in appearance. Mild to moderate mitral annular calcification. Trivial mitral valve regurgitation. Tricuspid Valve: The tricuspid valve is normal in structure. Tricuspid valve regurgitation is moderate. Aortic Valve: The aortic valve is tricuspid. Aortic valve regurgitation is not visualized. Aortic valve sclerosis/calcification is present, without any evidence of aortic stenosis. Aortic valve peak gradient measures 5.1 mmHg. Pulmonic Valve: The pulmonic valve was grossly normal. Pulmonic valve regurgitation is trivial. Aorta: The aortic root and ascending aorta are structurally normal, with no evidence of dilitation. Venous: The inferior vena cava is dilated in size with  less than 50% respiratory variability, suggesting right atrial pressure of 15 mmHg. IAS/Shunts: The interatrial septum was not well visualized.  LEFT VENTRICLE PLAX 2D LVIDd:         4.70 cm LVIDs:         3.40 cm LV PW:         1.00 cm LV IVS:        0.90 cm LVOT diam:     2.00 cm LV SV:         36 LV SV Index:   21 LVOT Area:     3.14 cm  RIGHT VENTRICLE            IVC RV S prime:     9.25 cm/s  IVC diam: 2.40 cm TAPSE (M-Wells): 1.1 cm LEFT ATRIUM             Index        RIGHT ATRIUM           Index LA diam:        4.50 cm 2.66 cm/m   RA Area:     15.70 cm LA Vol (A2C):   58.7 ml 34.64 ml/m  RA Volume:   38.20 ml  22.54 ml/m LA Vol (A4C):   49.1 ml 28.98 ml/m LA Biplane Vol: 53.5 ml 31.57 ml/m  AORTIC VALVE AV Area (Vmax): 2.28 cm AV Vmax:        113.00 cm/s AV Peak Grad:   5.1 mmHg LVOT Vmax:      82.00 cm/s LVOT Vmean:     55.550 cm/s LVOT VTI:       0.114 m  AORTA Ao Root diam: 3.50 cm Ao Asc diam:  3.60 cm TRICUSPID VALVE TR Peak grad:   33.4 mmHg TR Vmax:        289.00 cm/s  SHUNTS Systemic VTI:  0.11 m Systemic Diam: 2.00 cm Lonni Nanas MD Electronically signed by Lonni Nanas MD Signature Date/Time: 02/20/2024/6:17:36 PM    Final    DG Chest Port 1 View Result Date: 02/20/2024 CLINICAL DATA:  Shortness of breath. EXAM: PORTABLE CHEST 1 VIEW COMPARISON:  March 14, 2021. FINDINGS: Stable cardiomegaly. Both lungs are clear. The visualized skeletal structures are unremarkable. IMPRESSION: No active disease. Electronically Signed   By: Lynwood Landy Raddle M.D.   On: 02/20/2024 14:31    EKG:on arrival shows likely atypical atrial flutter, with atrial rate of ~190-200 bpm but controlled V rates(personally reviewed)  TELEMETRY: likely atypical flutter with overall controlled rates 50-70s, slower overnight (personally reviewed)  Assessment/Plan:  SVT Atypical Atrial flutter Post conversion pauses Bradycardia Sleep disorder breathing 6.5 second pause on Zio was post  conversion Nocturnal bradycardia is hemodynamically stable.  No urgent indication for pacing.   Would recommend amiodarone  to control his arrhythmias, in hopes that in avoiding arrhythmia, can avoid post-conversion pauses.   He is not a candidate for other AADs in the setting of  ESRD.   If staying or having poorly tolerated AFL, can use IV amiodarone  and plan [place].  Otherwise Rai Severns start po load and taper at 400 mg BID x 1 week, 200 mg BID x 1 week, then 200 mg daily.   Can transition off diltiazem  and onto amiodarone  as tolerated. With baseline brady would avoid diltiazem  chronically if rates controlled on amio alone.   If he has hemodynamically significant bradycardia in the setting of normal electrolytes, may need to consider pacing in the future. He would only be a candidate for leadless PPM.   ESRD Hyperkalemia For HD today.   For questions or updates, please contact [facility] Please consult [email or web address] for contact info under     Signed, Ozell Prentice Passey, PA-C  03/18/2024, 10:04 AM       I have seen and examined this patient with Jodie Passey.  Agree with above, note added to reflect my findings.  Patient with a past history as above.  He was hospitalized 03/08/2024 with hyperkalemia.  He was then atypical atrial flutter during dialysis.  Telemetry also noted episodes of sinus bradycardia which occurred when he was hyperkalemic.  He was discharged with a cardiac monitor.  While wearing the monitor, he continued to have episodes of atrial flutter with multiple posttermination pauses.  He was readmitted to the hospital with oozing from his fistula.  He was noted to be tachycardic.  He feels well currently and is without complaint.  GEN: No acute distress.   Neck: No JVD Cardiac: Irregular Respiratory: Normal work of breathing GI: Soft, nontender, non-distended  MS: No edema; No deformity. Neuro:  Nonfocal  Skin: warm and dry Psych: Normal affect    Atypical  atrial flutter Postconversion pauses with tachybradycardia syndrome Sinus bradycardia  For now, we Jguadalupe Opiela try to treat his atrial flutter with amiodarone .  If we can keep him in normal rhythm, this should prevent postconversion pauses.  In addition, he was bradycardic in his prior hospitalization, though this was potentially related to his hyperkalemia.  No other EP interventions at this time.  If he has further episodes of bradycardia with normal electrolytes, would plan for leadless pacemaker implant.  Bria Portales M. Samad Thon MD 03/18/2024 11:25 AM

## 2024-03-18 NOTE — Progress Notes (Signed)
  Progress Note   Patient: Ryan Wells FMW:[id number] DOB: 1955-10-25 DOA: 03/16/2024     0 DOS: the patient was seen and examined on 03/18/2024        Brief hospital course: 68 y.o. M with ESRD on HD MWF, DM, HTN, gastroparesis, AAA, dCHF, hypothyroidism, and CAD recently admitted for hyperkalemic emergency who returned with pre-syncope and bleeding from fistula.  Family reported also that they had received an alert from his Zio patch, but were uncertain what it was.     Assessment and Plan: Pre-syncope Likely due to tachyarrhythmia or pauses, unclear.   Atypical atrial flutter (HCC) Sinus pause Yesterday transitioned to Alfutter with rapid response, overnight rate controlled easily with diltiazem  without pauses.  Back in sinus this morning. - Consult EP - Continue diltiazem  gtt - Hold Eliquis  until EP decision re: PPM -Lokelma   ESRD on dialysis (HCC) Hyperkalemia Dialyzes MWF, was due yesterday, this was inadvertently delayed due to my misunderstanding.  Spoke with Nephrology this morning. - Consult Nephrology for routine HD - Continue home Renvela  and Lokelma   Bleeding from fistula Resolved, no further work up needed  Hypothyroidism Recent TSH elevated -Continue levothyroxine  - Follow-up with PCP  Diabetic gastroparesis (HCC) No active symptoms  Anemia due to chronic kidney disease Hgb stable relative to baseline  Primary hypertension BP soft, not on antihypertensives at home - Continue diltiazem  drip  Type 2 diabetes mellitus (HCC) Glucose normal, not on meds at home - Continue SS corrections  Chronic diastolic CHF (congestive heart failure) (HCC) Appears euvolemic  Hyperlipidemia - Continue Lipitor          Subjective: Patient feels well overnight, has had no fever, chest pain, dyspnea.  All history collected through video phonic interpreter.     Physical Exam: BP 101/89 (BP Location: Right Arm)   Pulse 62   Temp 98.5 F (36.9 C)  (Oral)   Resp 18   Ht 5' 2 (1.575 m)   Wt 72.5 kg   SpO2 100%   BMI 29.25 kg/m   Overweight adult male, sitting up in bed, finished breakfast RRR, no murmurs, no peripheral edema Respiratory rate normal, lungs clear without rales or wheezes Abdomen soft no tenderness palpation or guarding Attention normal, affect pleasant, judgment and insight appear normal, oriented to person, place, time, upper extremity strength 5/5 and symmetric, speech fluent    Data Reviewed: Discussed with nephrology Basic metabolic panel shows hyperkalemia CBC shows stable anemia     Family Communication:     Disposition: Status is: Observation         Author: Lonni SHAUNNA Dalton, MD 03/18/2024 8:54 AM  For on call review [email or web address].

## 2024-03-19 ENCOUNTER — Other Ambulatory Visit (HOSPITAL_COMMUNITY): Payer: Self-pay

## 2024-03-19 DIAGNOSIS — I471 Supraventricular tachycardia, unspecified: Secondary | ICD-10-CM | POA: Diagnosis not present

## 2024-03-19 DIAGNOSIS — I455 Other specified heart block: Secondary | ICD-10-CM | POA: Diagnosis not present

## 2024-03-19 DIAGNOSIS — R55 Syncope and collapse: Secondary | ICD-10-CM | POA: Diagnosis not present

## 2024-03-19 DIAGNOSIS — I484 Atypical atrial flutter: Secondary | ICD-10-CM | POA: Diagnosis not present

## 2024-03-19 LAB — CBC
HCT: 26.1 % — ABNORMAL LOW (ref 39.0–52.0)
Hemoglobin: 8.6 g/dL — ABNORMAL LOW (ref 13.0–17.0)
MCH: 32.2 pg (ref 26.0–34.0)
MCHC: 33 g/dL (ref 30.0–36.0)
MCV: 97.8 fL (ref 80.0–100.0)
Platelets: 149 K/uL — ABNORMAL LOW (ref 150–400)
RBC: 2.67 MIL/uL — ABNORMAL LOW (ref 4.22–5.81)
RDW: 15.3 % (ref 11.5–15.5)
WBC: 5.8 K/uL (ref 4.0–10.5)
nRBC: 0 % (ref 0.0–0.2)

## 2024-03-19 LAB — BASIC METABOLIC PANEL WITH GFR
Anion gap: 12 (ref 5–15)
BUN: 38 mg/dL — ABNORMAL HIGH (ref 8–23)
CO2: 30 mmol/L (ref 22–32)
Calcium: 8.9 mg/dL (ref 8.9–10.3)
Chloride: 93 mmol/L — ABNORMAL LOW (ref 98–111)
Creatinine, Ser: 5.4 mg/dL — ABNORMAL HIGH (ref 0.61–1.24)
GFR, Estimated: 11 mL/min — ABNORMAL LOW (ref >60)
Glucose, Bld: 134 mg/dL — ABNORMAL HIGH (ref 70–99)
Potassium: 4.2 mmol/L (ref 3.5–5.1)
Sodium: 135 mmol/L (ref 135–145)

## 2024-03-19 LAB — GLUCOSE, CAPILLARY
Glucose-Capillary: 121 mg/dL — ABNORMAL HIGH (ref 70–99)
Glucose-Capillary: 169 mg/dL — ABNORMAL HIGH (ref 70–99)

## 2024-03-19 MED ORDER — AMIODARONE HCL 200 MG PO TABS
ORAL_TABLET | ORAL | 0 refills | Status: DC
  Filled 2024-03-19: qty 58, 30d supply, fill #0

## 2024-03-19 NOTE — Progress Notes (Signed)
 DAILY PROGRESS NOTE   Patient Name: Ryan Wells Date of Encounter: 03/19/2024 Cardiologist: Lonni LITTIE Nanas, MD  Chief Complaint   No complaints  Patient Profile   Ryan Wells is a 68 y.o. male with a hx of ESRD on HD on MWF, hypertension, diabetes, prior psoas abscess with MSSA bacteremia, atrial flutter, bradycardia, syncope, anemia of chronic disease, thrombocytopenia, AAA, diabetic gastroparesis, hypothyroidism, who is being seen 03/17/2024 for the evaluation of atrial flutter vs SVT at the request of Dr. Jonel.   Subjective   Seen by EP yesterday, started on amiodarone . Diltiazem  stopped. Feels well without complaints. Telemetry was stable overnight with sinus rhythm and occasional PVC's.  Objective   Vitals:   03/18/24 2027 03/18/24 2030 03/18/24 2336 03/19/24 0407  BP:  135/72 (!) 140/75 133/72  Pulse: (!) 101 100 98 94  Resp: 12 12 14 15   Temp:  98.2 F (36.8 C) 98.8 F (37.1 C) 98.9 F (37.2 C)  TempSrc:  Oral Oral Oral  SpO2: 100% 100% 99% 99%  Weight:    71 kg  Height:        Intake/Output Summary (Last 24 hours) at 03/19/2024 0851 Last data filed at 03/19/2024 9177 Gross per 24 hour  Intake 480 ml  Output 2500 ml  Net -2020 ml   Filed Weights   03/18/24 1645 03/18/24 1705 03/19/24 0407  Weight: 74 kg 74 kg 71 kg    Physical Exam   General appearance: alert and no distress Lungs: clear to auscultation bilaterally Heart: regular rate and rhythm, S1, S2 normal, no murmur, click, rub or gallop Extremities: edema 2+ LUE, fistula in the LUE, bandaged Neurologic: Grossly normal  Inpatient Medications    Scheduled Meds:  amiodarone   400 mg Oral BID   atorvastatin   20 mg Oral QHS   Chlorhexidine  Gluconate Cloth  6 each Topical Q0600   insulin  aspart  0-5 Units Subcutaneous QHS   insulin  aspart  0-6 Units Subcutaneous TID WC   levothyroxine   88 mcg Oral QAC breakfast   multivitamin  1 tablet Oral QHS   sevelamer  carbonate   1,600 mg Oral TID with meals   sodium zirconium cyclosilicate   5 g Oral Daily    Continuous Infusions:  diltiazem  (CARDIZEM ) infusion Stopped (03/18/24 1615)    PRN Meds: acetaminophen  **OR** acetaminophen    Labs   Results for orders placed or performed during the hospital encounter of 03/16/24 (from the past 48 hours)  CBG monitoring, ED     Status: Abnormal   Collection Time: 03/17/24 12:28 PM  Result Value Ref Range   Glucose-Capillary 117 (H) 70 - 99 mg/dL    Comment: Glucose reference range applies only to samples taken after fasting for at least 8 hours.  Glucose, capillary     Status: Abnormal   Collection Time: 03/17/24  3:52 PM  Result Value Ref Range   Glucose-Capillary 174 (H) 70 - 99 mg/dL    Comment: Glucose reference range applies only to samples taken after fasting for at least 8 hours.  Glucose, capillary     Status: None   Collection Time: 03/17/24  9:23 PM  Result Value Ref Range   Glucose-Capillary 99 70 - 99 mg/dL    Comment: Glucose reference range applies only to samples taken after fasting for at least 8 hours.   Comment 1 Notify RN    Comment 2 Document in Chart   CBC     Status: Abnormal   Collection Time: 03/18/24  2:42 AM  Result Value Ref Range   WBC 7.1 4.0 - 10.5 K/uL   RBC 2.74 (L) 4.22 - 5.81 MIL/uL   Hemoglobin 8.8 (L) 13.0 - 17.0 g/dL   HCT 73.3 (L) 60.9 - 47.9 %   MCV 97.1 80.0 - 100.0 fL   MCH 32.1 26.0 - 34.0 pg   MCHC 33.1 30.0 - 36.0 g/dL   RDW 84.6 88.4 - 84.4 %   Platelets 155 150 - 400 K/uL   nRBC 0.0 0.0 - 0.2 %    Comment: Performed at [facility], [address], [place], [place] [address]  Basic metabolic panel with GFR     Status: Abnormal   Collection Time: 03/18/24  2:42 AM  Result Value Ref Range   Sodium 132 (L) 135 - 145 mmol/L   Potassium 6.0 (H) 3.5 - 5.1 mmol/L   Chloride 91 (L) 98 - 111 mmol/L   CO2 25 22 - 32 mmol/L   Glucose, Bld 101 (H) 70 - 99 mg/dL    Comment: Glucose reference range applies only  to samples taken after fasting for at least 8 hours.   BUN 69 (H) 8 - 23 mg/dL   Creatinine, Ser 2.20 (H) 0.61 - 1.24 mg/dL   Calcium  8.8 (L) 8.9 - 10.3 mg/dL   GFR, Estimated 7 (L) >60 mL/min    Comment: (NOTE) Calculated using the CKD-EPI Creatinine Equation (2021)    Anion gap 16 (H) 5 - 15    Comment: Performed at [facility], [address], [place], [place] [address]  Magnesium      Status: None   Collection Time: 03/18/24  2:42 AM  Result Value Ref Range   Magnesium  2.4 1.7 - 2.4 mg/dL    Comment: Performed at [facility], [address] [place], [place] [address]  Glucose, capillary     Status: Abnormal   Collection Time: 03/18/24  5:00 AM  Result Value Ref Range   Glucose-Capillary 101 (H) 70 - 99 mg/dL    Comment: Glucose reference range applies only to samples taken after fasting for at least 8 hours.  Glucose, capillary     Status: Abnormal   Collection Time: 03/18/24  6:06 AM  Result Value Ref Range   Glucose-Capillary 121 (H) 70 - 99 mg/dL    Comment: Glucose reference range applies only to samples taken after fasting for at least 8 hours.   Comment 1 Notify RN    Comment 2 Document in Chart   Glucose, capillary     Status: Abnormal   Collection Time: 03/18/24 11:30 AM  Result Value Ref Range   Glucose-Capillary 132 (H) 70 - 99 mg/dL    Comment: Glucose reference range applies only to samples taken after fasting for at least 8 hours.  Glucose, capillary     Status: Abnormal   Collection Time: 03/18/24  3:22 PM  Result Value Ref Range   Glucose-Capillary 219 (H) 70 - 99 mg/dL    Comment: Glucose reference range applies only to samples taken after fasting for at least 8 hours.  Glucose, capillary     Status: None   Collection Time: 03/18/24  9:35 PM  Result Value Ref Range   Glucose-Capillary 83 70 - 99 mg/dL    Comment: Glucose reference range applies only to samples taken after fasting for at least 8 hours.  CBC     Status: Abnormal   Collection  Time: 03/19/24  2:54 AM  Result  Value Ref Range   WBC 5.8 4.0 - 10.5 K/uL   RBC 2.67 (L) 4.22 - 5.81 MIL/uL   Hemoglobin 8.6 (L) 13.0 - 17.0 g/dL   HCT 73.8 (L) 60.9 - 47.9 %   MCV 97.8 80.0 - 100.0 fL   MCH 32.2 26.0 - 34.0 pg   MCHC 33.0 30.0 - 36.0 g/dL   RDW 84.6 88.4 - 84.4 %   Platelets 149 (L) 150 - 400 K/uL   nRBC 0.0 0.0 - 0.2 %    Comment: Performed at [facility], [address], [place], [place] [address]  Basic metabolic panel with GFR     Status: Abnormal   Collection Time: 03/19/24  2:54 AM  Result Value Ref Range   Sodium 135 135 - 145 mmol/L   Potassium 4.2 3.5 - 5.1 mmol/L   Chloride 93 (L) 98 - 111 mmol/L   CO2 30 22 - 32 mmol/L   Glucose, Bld 134 (H) 70 - 99 mg/dL    Comment: Glucose reference range applies only to samples taken after fasting for at least 8 hours.   BUN 38 (H) 8 - 23 mg/dL   Creatinine, Ser 4.59 (H) 0.61 - 1.24 mg/dL   Calcium  8.9 8.9 - 10.3 mg/dL   GFR, Estimated 11 (L) >60 mL/min    Comment: (NOTE) Calculated using the CKD-EPI Creatinine Equation (2021)    Anion gap 12 5 - 15    Comment: Performed at [facility], [address] [place], [place] [address]  Glucose, capillary     Status: Abnormal   Collection Time: 03/19/24  6:23 AM  Result Value Ref Range   Glucose-Capillary 121 (H) 70 - 99 mg/dL    Comment: Glucose reference range applies only to samples taken after fasting for at least 8 hours.    ECG   N/A  Telemetry   Sinus rhythm with occasional PVC's - Personally Reviewed  Radiology    No results found.   Cardiac Studies   N/A  Assessment   Principal Problem:   Pre-syncope Active Problems:   Chest pain of uncertain etiology   Hypothyroidism   Diabetic gastroparesis (HCC)   Anemia due to chronic kidney disease   Primary hypertension   Type 2 diabetes mellitus (HCC)   ESRD on dialysis (HCC)   Atypical atrial flutter (HCC)   Chronic diastolic CHF (congestive heart failure) (HCC)   Sinus  pause   History of atrial flutter   Atrial fibrillation with rapid ventricular response (HCC)   Tachyarrhythmia   Plan   Rhythm is more stable today on amiodarone . Probably can be discharged home from a cardiology standpoint. Currently has a 14 days live Zio monitor in place, which should give updates for any significant rhythm abnormalities to the office. The patient should be instructed to return the monitor in the supplied box after the 14 day time period has ended.  Notified Dr. Jonel at beside of LUE swelling in the dialysis arm - he will investigate.  [facility] will sign off.   Medication Recommendations:  as above Other recommendations (labs, testing, etc):  none Follow up as an outpatient:  Ryan Louder, PA-C, 03/31/24   Time Spent Directly with Patient:  I have spent a total of 25 minutes with the patient reviewing hospital notes, telemetry, EKGs, labs and examining the patient as well as establishing an assessment and plan that was discussed personally with the patient.  > 50% of time was spent  in direct patient care.  Length of Stay:  LOS: 1 day   Ryan KYM Maxcy, MD, [facility], FNLA, FACP  [place]  [facility]  Medical Director of the [facility]  Card[facility] Clinic Diplomate of the American Board of Clinical Lipidology Attending Cardiologist  Direct Dial: [phone]  Fax: [phone]  Website:  [email or web address] Ryan Wells 03/19/2024, 8:51 AM

## 2024-03-19 NOTE — Progress Notes (Addendum)
 Pt receives out-pt HD at [facility] on MWF. Contacted by attending this morning with request to investigate if pt's clinic can treat pt tomorrow if d/c today. Contacted clinic who states they can likely work pt in for treatment tomorrow once it is confirmed that pt will d/c today. Navigator will have to call back for chair time. Update provided to attending and nephrologist. Will assist as needed.   Randine Mungo Dialysis Navigator [phone]  Addendum at 11:45 am: Clinic can treat pt tomorrow. Pt will need to arrive at 11:50 am for 12:15 pm chair time. Met with pt at bedside with on-site interpreter. Pt agreeable to Saturday appt if transportation can be arranged with pt's transportation provider. Contacted [facility] (who pt uses) and scheduled pt's transportation to/from HD appt tomorrow. Pt aware of appt times and that transportation has been arranged. Pt aware that transportation will pick pt up tomorrow between 11:00-11:30 and that pt will resume normal days/time on Monday/next week. Clinic aware pt will d/c today and be at appt tomorrow.  Update provided to attending, nephrologist, and renal NP.

## 2024-03-19 NOTE — Progress Notes (Signed)
 Send into the patient 's room to check patient's avf due to a reported active bleeding.Venous site dressing has a moderate dry blood on it,removed old dressing,venous of avf has moderate active bleeding,wiped off the blood,needle hole is closed,no gaping hole.Placed surgical foam on top of it and 10 minutes pressure.Bleeding stopped and new dressing applied.

## 2024-03-19 NOTE — Discharge Summary (Signed)
 Physician Discharge Summary   Patient: Ryan Wells MRN: [id number] DOB: July 16, 1956  Admit date:     03/16/2024  Discharge date: 03/19/24  Discharge Physician: Lonni SHAUNNA Dalton   PCP: Adella Norris, MD     Recommendations at discharge:  Follow up with PCP Dr. Adella on Aug 12 for arrhythmia, new amiodarone  Follow up with Cardiology on Aug 20 for new amiodarone  and Zio patch Dr. Adella: TSH 11 last month, LT4 adjustment deferred inpatient, see below      Discharge Diagnoses: Principal Problem:   Pre-syncope Active Problems:   Atypical atrial flutter   Sinus pauses   ESRD on dialysis (HCC)   Hypothyroidism   Diabetic gastroparesis (HCC)   Anemia due to chronic kidney disease   Primary hypertension   Type 2 diabetes mellitus (HCC)   Chronic diastolic CHF (congestive heart failure) [facility])      Hospital Course: 68 y.o. M with ESRD on HD MWF, DM, HTN, gastroparesis, AAA, dCHF, hypothyroidism, and CAD recently admitted for hyperkalemic emergency who returned with pre-syncope and bleeding from fistula.    Language barrier impaired history taking and what the patient reported to me at time of discharge, does not 100% match available records.  Per records, patient recently evaluated for weakness, found to have K>7 and alternating bradyarrhythmias and tachyarrhythmias, admitted to ICU, hospitalized 7/28 to 8/1, stabilized after dialysis and cardiology discharged with Zio patch.  After discharge, patient had an alarm.  From what I can see, the monitoring company, [employer], called patient to alert him, found that he was symptomatic, told him to go to the ER, but he preferred to stay home.  Overnight Cardiology fellow on 8/3 called patient again with interpreter, patient reported feeling about to pass out and dazed but patient again did not present to ER.  Finally, on 8/5, patient had prolonged oozing from his fistula after HD, so family brought him to the ER.   There, family again mentioned Zio alerts to EDP and so patient was admitted for EP evaluation.     Tachycardia Atrial flutter Patient observed 72 hours on monitoring.  He had no pauses, but did have new rapid atrial flutter on HD2 when his K was up to 6 before HD.  This was treated with diltiazem  drip and rate normalized.  EP evaluated patient, deferred pacer and recommended amiodarone .    - Amio 400 BID for 1 week then on Aug 14 reduce to 200 BID then on Aug 21 reduce to 200 daily until changed by Cardiology/EP  - Cardiology to follow up Zio    Bleeding fistula Surgicel placed by EDP, bleeding stopped.  Dialyzed in the hospital without issues. - Resume Eliquis   Hypothyroidism TSH 11 one week ago.  From what I can see, levothyroxine  dose has been 88 mcg for over a year.  Given tachyarrhythmias, I have not increased LT4 dose for now, but defer to PCP to recheck TSH in 2-4 weeks and adjust as appropriate.  L arm swelling Some edema in fistula arm noted.  No bruising to suggest bleed.  Good thrill.  No pain or warmth to suggest infection.  - Monitor           [place]  Controlled Substances Registry was reviewed for this patient prior to discharge.  Consultants: Cardiology Electrophysiology Nephrology   Disposition: Home Diet recommendation:  Discharge Diet Orders (From admission, onward)     Start     Ordered   03/19/24 0000  Diet - low sodium  heart healthy        03/19/24 1155             DISCHARGE MEDICATION: Allergies as of 03/19/2024   No Known Allergies      Medication List     TAKE these medications    acetaminophen  325 MG tablet Commonly known as: TYLENOL  Take 2 tablets (650 mg total) by mouth every 6 (six) hours as needed for mild pain (or Fever >/= 101).   amiodarone  200 MG tablet Commonly known as: PACERONE  Take 400 mg (2 tabs) twice daily for 1 week then take 200 mg (1 tab) twice daily for 1 week then take 200 mg once daily  from then on   atorvastatin  20 MG tablet Commonly known as: LIPITOR TAKE 1 TABLET BY MOUTH DAILY WITH THE EVENING MEAL What changed: See the new instructions.   Dialyvite 800 0.8 MG Tabs Take 1 tablet by mouth at bedtime.   Eliquis  5 MG Tabs tablet Generic drug: apixaban  Take 1 tablet (5 mg total) by mouth 2 (two) times daily.   levothyroxine  88 MCG tablet Commonly known as: SYNTHROID  TAKE 1 TABLET(88 MCG) BY MOUTH DAILY What changed: See the new instructions.   Lokelma  5 g packet Generic drug: sodium zirconium cyclosilicate  Take 5 g by mouth daily.   sevelamer  carbonate 800 MG tablet Commonly known as: RENVELA  Take 1,600 mg by mouth with breakfast, with lunch, and with evening meal.        Follow-up Information     [facility]. Go on 03/20/2024.   Why: For appointment on Saturday only (make-up appointment)- arrive at 11:50 am for 12:15 pm chair time.  Transportation will pick patient up at home between 11:00 am- 11:30 am. Contact information: [address] [place] [address] [phone]         [facility] Follow up.   Specialty: Home Health Services Why: Physical and Occupational Therapy-office to call with visit times. Contact information: [address] [place] [address] [phone]                 Discharge Instructions     Diet - low sodium heart healthy   Complete by: As directed    Discharge instructions   Complete by: As directed    **IMPORTANT DISCHARGE INSTRUCTIONS**   From Dr. Jonel: You were seen in the ER for bleeding from your fistula, which stopped easily. Here, we understood you to say that you were also still having episodes of dizziness and feeling dazed and feeling weak Based on the data from your Zio patch (the heart monitor you've been wearing the last two weeks), we believe those symptoms were from your abnormal heart rhythms (in the last month, we've  documented that you are having pauses where the heart stops beating for 2 to 6 seconds and also rapid heart rates called tachyarrhythmias where the heart beats 120 to 160 times per minute)  Here in the hospital, you were evaluated by an electrophysiologist, a cardiologist who specializes in abnormal heart rhythsm like this   He recommended to start the new medicine amiodarone  to suppress abnormal heart rhythms  Take amiodarone  400 mg (two tabs) twice daily for one week (first home dose tonight) On Thursday Aug 14th, reduce to 200 mg (one tab) twice daily for one week On Thursday Aug 21nd, reduce to 200 mg (one tab) once daily  Take amiodarone  200 mg once daily from now on, until instructed  by your heart doctor  Go see Dr. Adella on Tuesday (see below) Go see the heart doctors Rollo Louder on Aug 20 (see below)  Resume your other medicines Go to dialysis tomorrow as instructed by the renal navigator Then on Monday resume your normal dialysis schedule   Increase activity slowly   Complete by: As directed        Discharge Exam: Filed Weights   03/18/24 1645 03/18/24 1705 03/19/24 0407  Weight: 74 kg 74 kg 71 kg    General: Pt is alert, awake, not in acute distress Cardiovascular: RRR, nl S1-S2, no murmurs appreciated.   No LE edema.  Mild L arm edema Respiratory: Normal respiratory rate and rhythm.  CTAB without rales or wheezes. Abdominal: Abdomen soft and non-tender.  No distension or HSM.   Neuro/Psych: Strength symmetric in upper and lower extremities.  Judgment and insight appear normal.  All history through videophonic interpreter [phone]   Condition at discharge: good  The results of significant diagnostics from this hospitalization (including imaging, microbiology, ancillary and laboratory) are listed below for reference.   Imaging Studies: DG Chest Port 1 View Result Date: 03/16/2024 CLINICAL DATA:  Shortness of breath and bleeding from dialysis fistula  following completion of dialysis 1 hour previous. EXAM: PORTABLE CHEST 1 VIEW COMPARISON:  Portable chest 03/08/2024. FINDINGS: The heart is moderately enlarged but unchanged. No vascular congestion is seen. The mediastinum is normally outlined. There is calcification in the transverse aorta. There is scattered linear scarring or atelectasis in the lung fields and minimal right pleural effusion. The lungs are otherwise clear. An electronic device overlies the left mid chest. There is osteopenia and degenerative change of the spine. A 4 level lower cervical ACDF plating is again shown. IMPRESSION: 1. Stable cardiomegaly. No vascular congestion. 2. Scattered linear scarring or atelectasis in the lung fields and minimal right pleural effusion. 3. Aortic atherosclerosis. Electronically Signed   By: Francis Quam M.D.   On: 03/16/2024 21:40   MR LUMBAR SPINE W WO CONTRAST Result Date: 03/10/2024 CLINICAL DATA:  Lumbar radiculopathy, infection suspected, positive xray/CT EXAM: MRI LUMBAR SPINE WITHOUT AND WITH CONTRAST TECHNIQUE: Multiplanar and multiecho pulse sequences of the lumbar spine were obtained without and with intravenous contrast. CONTRAST:  8mL GADAVIST  GADOBUTROL  1 MMOL/ML IV SOLN COMPARISON:  MRI lumbar spine 03/16/2021. FINDINGS: Segmentation:  Standard. Alignment:  No substantial sagittal subluxation Vertebrae: Comparison 10/01/2020, interval disc collapse and progressive endplate erosive change, compatible with chronic osteomyelitis. Adjacent endplate edema and enhancement, which does appear proved since 2022. Adjacent L5 and S1 endplate edema is persistent but improved. Conus medullaris and cauda equina: Conus extends to the L2-L3 level. Conus and cauda equina appear normal. Paraspinal and other soft tissues: Equivocal trace paraspinal edema along the ventral sacral vertebral bodies. No abscess. Disc levels: T12-L1: No significant disc protrusion, foraminal stenosis, or canal stenosis. L1-L2: No  significant disc protrusion, foraminal stenosis, or canal stenosis. L2-L3: Slight left eccentric disc bulging.  No significant stenosis. L3-L4: Mild disc bulging. Bilateral facet arthropathy. Resulting mild left foraminal stenosis. Patent canal and right foramen. L4-L5: Broad disc bulge with moderate bilateral facet arthropathy. Resulting moderate bilateral foraminal stenosis. Patent canal. L5-S1: Findings of chronic discitis/osteomyelitis detailed above. Severe bilateral facet arthropathy. Resulting severe bilateral foraminal stenosis. IMPRESSION: 1. Findings compatible with chronic L5-S1 discitis/osteomyelitis. Disc edema is improved since 2022, but there has been interval disc collapse and progressive endplate erosive change. There is adjacent endplate marrow edema and enhancement, also improved since 2022. Equivocal  trace paraspinal edema without abscess. 2. At L5-S1, severe bilateral foraminal stenosis. 3. At L4-L5, moderate bilateral foraminal stenosis. Electronically Signed   By: Gilmore GORMAN Molt M.D.   On: 03/10/2024 02:13   CT LUMBAR SPINE WO CONTRAST Result Date: 03/08/2024 CLINICAL DATA:  Lower back pain, cauda equina syndrome suspected. Transient bilateral lower extremity weakness. EXAM: CT LUMBAR SPINE WITHOUT CONTRAST TECHNIQUE: Multidetector CT imaging of the lumbar spine was performed without intravenous contrast administration. Multiplanar CT image reconstructions were also generated. RADIATION DOSE REDUCTION: This exam was performed according to the departmental dose-optimization program which includes automated exposure control, adjustment of the mA and/or kV according to patient size and/or use of iterative reconstruction technique. COMPARISON:  CT pelvis 03/17/2021, MRI lumbar spine 03/16/2021. FINDINGS: Segmentation: 5 lumbar type vertebrae. Alignment: Lumbar lordosis is maintained.  No listhesis. Vertebrae: There is irregularity and sclerosis of the L5 inferior endplate and S1 superior  endplate which is progressed since the 2022 CT. There is increased irregularity of the L5 inferior endplate with areas of fragmentation and erosive change. There is vacuum disc phenomena noted within the adjacent disc space. Vertebral body heights are otherwise maintained. No additional osseous lesion. Degenerative changes and ankylosis of the sacroiliac joints. Paraspinal and other soft tissues: The paraspinal musculature is unremarkable. Mild scattered atherosclerosis of the abdominal aorta and branch vessels. Atrophic appearance of the bilateral native kidneys. There is likely cholelithiasis. Ascites noted in the pelvis with additional small in fluid more superiorly within the abdomen. Disc levels: T12-L1: No significant disc bulge. No significant spinal canal stenosis or foraminal stenosis. L1-2: Small disc bulge. Mild facet arthrosis. No significant spinal canal stenosis. Mild foraminal stenosis on the right L2-3: Vacuum disc phenomenon. Diffuse disc bulge. Bilateral facet arthrosis slightly greater on the right. No significant spinal canal stenosis. Mild foraminal stenosis on the right. L3-4: Diffuse disc bulge. Bilateral facet arthrosis and thickening of the ligamentum flavum. Mild spinal canal stenosis. Mild bilateral foraminal stenosis slightly greater on the left. L4-5: Diffuse disc bulge. Severe facet arthrosis. Thickening of the ligamentum flavum. Mild spinal canal stenosis. There is moderate bilateral foraminal stenosis. L5-S1: Endplate erosive changes as above. Small disc bulge and posterior osteophytes. Severe facet arthrosis. There is erosive change of the bilateral facets more pronounced on the left. Mild spinal canal stenosis. Moderate to severe right and moderate left foraminal stenosis. IMPRESSION: Interval progression of irregularity and sclerosis of the L5-S1 endplates. Findings likely reflect sequelae of discitis/osteomyelitis. Recommend correlation with MRI lumbar spine with and without  contrast for further evaluation. Degenerative changes as above. Foraminal stenosis most pronounced on the right at L5-S1. Electronically Signed   By: Donnice Mania M.D.   On: 03/08/2024 15:01   DG Chest Portable 1 View Result Date: 03/08/2024 CLINICAL DATA:  Near syncope. EXAM: PORTABLE CHEST 1 VIEW COMPARISON:  February 20, 2024 FINDINGS: The cardiac silhouette is enlarged and unchanged in size. Low lung volumes are noted with multiple overlying cardiac lead wires and defibrillator leads. No acute infiltrate, pleural effusion or pneumothorax is identified. Postoperative changes are present within the lower cervical spine. No acute osseous abnormalities are seen. IMPRESSION: Low lung volumes without acute or active cardiopulmonary disease. Electronically Signed   By: Suzen Dials M.D.   On: 03/08/2024 13:35   ECHOCARDIOGRAM LIMITED Result Date: 02/22/2024    ECHOCARDIOGRAM LIMITED REPORT   Patient Name:   RANDY CASTREJON Date of Exam: 02/22/2024 Medical Rec #:  [id number]  Height:       62.0 in Accession #:    [id number]           Weight:       143.7 lb Date of Birth:  1956/04/21             BSA:          1.661 m Patient Age:    68 years             BP:           144/64 mmHg Patient Gender: M                    HR:           53 bpm. Exam Location:  Inpatient Procedure: Limited Echo, Cardiac Doppler and Color Doppler (Both Spectral and            Color Flow Doppler were utilized during procedure). Indications:    atrial flutter  History:        Patient has prior history of Echocardiogram examinations, most                 recent 02/20/2024. Risk Factors:Hypertension, Diabetes and                 Dyslipidemia.  Sonographer:    Philomena Daring Referring Phys: GAYATRI A ACHARYA IMPRESSIONS  1. Left ventricular ejection fraction, by estimation, is 50%. The left ventricle has low normal function. Left ventricular endocardial border not optimally defined to evaluate regional wall motion. There is mild left  ventricular hypertrophy.  2. Right ventricular systolic function is normal. The right ventricular size is mildly enlarged. There is mildly elevated pulmonary artery systolic pressure. The estimated right ventricular systolic pressure is 37.5 mmHg.  3. Left atrial size was mildly dilated.  4. Right atrial size was mildly dilated.  5. The mitral valve is degenerative. Mild mitral valve regurgitation.  6. The aortic valve is tricuspid. There is mild calcification of the aortic valve. There is mild thickening of the aortic valve.  7. The inferior vena cava is dilated in size with <50% respiratory variability, suggesting right atrial pressure of 15 mmHg. FINDINGS  Left Ventricle: Left ventricular ejection fraction, by estimation, is 50%. The left ventricle has low normal function. Left ventricular endocardial border not optimally defined to evaluate regional wall motion. There is mild left ventricular hypertrophy. Right Ventricle: The right ventricular size is mildly enlarged. Right ventricular systolic function is normal. There is mildly elevated pulmonary artery systolic pressure. The tricuspid regurgitant velocity is 2.37 m/s, and with an assumed right atrial pressure of 15 mmHg, the estimated right ventricular systolic pressure is 37.5 mmHg. Left Atrium: Left atrial size was mildly dilated. Right Atrium: Right atrial size was mildly dilated. Pericardium: Trivial pericardial effusion is present. Mitral Valve: The mitral valve is degenerative in appearance. Mild mitral valve regurgitation. Tricuspid Valve: The tricuspid valve is grossly normal. Tricuspid valve regurgitation is trivial. Aortic Valve: The aortic valve is tricuspid. There is mild calcification of the aortic valve. There is mild thickening of the aortic valve. Venous: The inferior vena cava is dilated in size with less than 50% respiratory variability, suggesting right atrial pressure of 15 mmHg. LEFT VENTRICLE PLAX 2D LVIDd:         4.90 cm LVIDs:          3.40 cm LV PW:         1.00 cm LV IVS:  1.10 cm LVOT diam:     1.80 cm LVOT Area:     2.54 cm  IVC IVC diam: 2.50 cm LEFT ATRIUM         Index LA diam:    4.00 cm 2.41 cm/m  TRICUSPID VALVE TR Peak grad:   22.5 mmHg TR Vmax:        237.00 cm/s  SHUNTS Systemic Diam: 1.80 cm Soyla Merck MD Electronically signed by Soyla Merck MD Signature Date/Time: 02/22/2024/5:28:10 PM    Final    ECHOCARDIOGRAM COMPLETE Result Date: 02/20/2024    ECHOCARDIOGRAM REPORT   Patient Name:   NAPOLEON MONACELLI Date of Exam: 02/20/2024 Medical Rec #:  [id number]            Height:       62.0 in Accession #:    [id number]           Weight:       150.6 lb Date of Birth:  1956-05-07             BSA:          1.694 m Patient Age:    68 years             BP:           139/98 mmHg Patient Gender: M                    HR:           142 bpm. Exam Location:  Inpatient Procedure: 2D Echo, Cardiac Doppler and Color Doppler (Both Spectral and Color            Flow Doppler were utilized during procedure). Indications:    Atrial Flutter I48.92  History:        Patient has prior history of Echocardiogram examinations, most                 recent 01/16/2021. CKD, stage 4; Risk Factors:Hypertension,                 Diabetes and Dyslipidemia.  Sonographer:    Thea Norlander RCS Referring Phys: WADDELL LABOR PARCELLS IMPRESSIONS  1. Left ventricular ejection fraction, by estimation, is 35 to 40%. The left ventricle has moderately decreased function. The left ventricle demonstrates global hypokinesis. There is mild left ventricular hypertrophy. Left ventricular diastolic parameters are indeterminate.  2. Right ventricular systolic function is mildly reduced. The right ventricular size is normal. There is moderately elevated pulmonary artery systolic pressure. The estimated right ventricular systolic pressure is 48.4 mmHg.  3. The mitral valve is degenerative. Trivial mitral valve regurgitation.  4. Tricuspid valve regurgitation is moderate.  5.  The aortic valve is tricuspid. Aortic valve regurgitation is not visualized. Aortic valve sclerosis/calcification is present, without any evidence of aortic stenosis.  6. The inferior vena cava is dilated in size with <50% respiratory variability, suggesting right atrial pressure of 15 mmHg. Conclusion(s)/Recommendation(s): Technically difficult study, patient tachycardic to 140s. Would consider repeat limited echo with contrast once heart rate improved. FINDINGS  Left Ventricle: Left ventricular ejection fraction, by estimation, is 35 to 40%. The left ventricle has moderately decreased function. The left ventricle demonstrates global hypokinesis. The left ventricular internal cavity size was normal in size. There is mild left ventricular hypertrophy. Left ventricular diastolic parameters are indeterminate. Right Ventricle: The right ventricular size is normal. No increase in right ventricular wall thickness. Right ventricular systolic function is mildly reduced. There is moderately elevated  pulmonary artery systolic pressure. The tricuspid regurgitant velocity is 2.89 m/s, and with an assumed right atrial pressure of 15 mmHg, the estimated right ventricular systolic pressure is 48.4 mmHg. Left Atrium: Left atrial size was normal in size. Right Atrium: Right atrial size was normal in size. Pericardium: There is no evidence of pericardial effusion. Mitral Valve: The mitral valve is degenerative in appearance. Mild to moderate mitral annular calcification. Trivial mitral valve regurgitation. Tricuspid Valve: The tricuspid valve is normal in structure. Tricuspid valve regurgitation is moderate. Aortic Valve: The aortic valve is tricuspid. Aortic valve regurgitation is not visualized. Aortic valve sclerosis/calcification is present, without any evidence of aortic stenosis. Aortic valve peak gradient measures 5.1 mmHg. Pulmonic Valve: The pulmonic valve was grossly normal. Pulmonic valve regurgitation is trivial. Aorta:  The aortic root and ascending aorta are structurally normal, with no evidence of dilitation. Venous: The inferior vena cava is dilated in size with less than 50% respiratory variability, suggesting right atrial pressure of 15 mmHg. IAS/Shunts: The interatrial septum was not well visualized.  LEFT VENTRICLE PLAX 2D LVIDd:         4.70 cm LVIDs:         3.40 cm LV PW:         1.00 cm LV IVS:        0.90 cm LVOT diam:     2.00 cm LV SV:         36 LV SV Index:   21 LVOT Area:     3.14 cm  RIGHT VENTRICLE            IVC RV S prime:     9.25 cm/s  IVC diam: 2.40 cm TAPSE (M-mode): 1.1 cm LEFT ATRIUM             Index        RIGHT ATRIUM           Index LA diam:        4.50 cm 2.66 cm/m   RA Area:     15.70 cm LA Vol (A2C):   58.7 ml 34.64 ml/m  RA Volume:   38.20 ml  22.54 ml/m LA Vol (A4C):   49.1 ml 28.98 ml/m LA Biplane Vol: 53.5 ml 31.57 ml/m  AORTIC VALVE AV Area (Vmax): 2.28 cm AV Vmax:        113.00 cm/s AV Peak Grad:   5.1 mmHg LVOT Vmax:      82.00 cm/s LVOT Vmean:     55.550 cm/s LVOT VTI:       0.114 m  AORTA Ao Root diam: 3.50 cm Ao Asc diam:  3.60 cm TRICUSPID VALVE TR Peak grad:   33.4 mmHg TR Vmax:        289.00 cm/s  SHUNTS Systemic VTI:  0.11 m Systemic Diam: 2.00 cm Lonni Nanas MD Electronically signed by Lonni Nanas MD Signature Date/Time: 02/20/2024/6:17:36 PM    Final    DG Chest Port 1 View Result Date: 02/20/2024 CLINICAL DATA:  Shortness of breath. EXAM: PORTABLE CHEST 1 VIEW COMPARISON:  March 14, 2021. FINDINGS: Stable cardiomegaly. Both lungs are clear. The visualized skeletal structures are unremarkable. IMPRESSION: No active disease. Electronically Signed   By: Lynwood Landy Raddle M.D.   On: 02/20/2024 14:31    Microbiology: Results for orders placed or performed during the hospital encounter of 03/08/24  MRSA Next Gen by PCR, Nasal     Status: None   Collection Time: 03/08/24  2:50 PM  Specimen: Nasal Mucosa; Nasal Swab  Result Value Ref Range Status   MRSA  by PCR Next Gen NOT DETECTED NOT DETECTED Final    Comment: (NOTE) The GeneXpert MRSA Assay (FDA approved for NASAL specimens only), is one component of a comprehensive MRSA colonization surveillance program. It is not intended to diagnose MRSA infection nor to guide or monitor treatment for MRSA infections. Test performance is not FDA approved in patients less than 73 years old. Performed at [facility], [address] [place], [place] [address]     Labs: CBC: Recent Labs  Lab 03/16/24 2140 03/17/24 0603 03/18/24 0242 03/19/24 0254  WBC 5.7 5.5 7.1 5.8  NEUTROABS 3.4  --   --   --   HGB 9.4* 9.0* 8.8* 8.6*  HCT 29.1* 27.1* 26.6* 26.1*  MCV 99.7 99.3 97.1 97.8  PLT 143* 149* 155 149*   Basic Metabolic Panel: Recent Labs  Lab 03/16/24 2140 03/17/24 0603 03/18/24 0242 03/19/24 0254  NA 133* 134* 132* 135  K 4.6 4.8 6.0* 4.2  CL 92* 93* 91* 93*  CO2 28 27 25 30   GLUCOSE 103* 117* 101* 134*  BUN 49* 52* 69* 38*  CREATININE 5.98* 6.67* 7.79* 5.40*  CALCIUM  8.8* 8.9 8.8* 8.9  MG  --  2.3 2.4  --    Liver Function Tests: No results for input(s): AST, ALT, ALKPHOS, BILITOT, PROT, ALBUMIN  in the last 168 hours. CBG: Recent Labs  Lab 03/18/24 1130 03/18/24 1522 03/18/24 2135 03/19/24 0623 03/19/24 1102  GLUCAP 132* 219* 83 121* 169*    Discharge time spent: approximately 45 minutes spent on discharge counseling, evaluation of patient on day of discharge, and coordination of discharge planning with nursing, social work, pharmacy and case management  Signed: Lonni SHAUNNA Dalton, MD [facility] 03/19/2024

## 2024-03-19 NOTE — Progress Notes (Addendum)
 DC order noted for home w/ HH. DC RN at bedside. Primary RN on break. S/w CM stating no DME/further needs prior to discharge. TOC meds picked up, delivered to the patient. AVS printed/reviewed with the patient via Language Line. Provided detailed instruction specific to the amiodarone  schedule to ensure pts competency. Pt able to complete teach-back regarding schedule. PIV removed. Skin intact. Patient awaiting family to bring clothes. Noted swelling to L hand. Pt states new this admission. Primary RN informed stating no change. Family at bedside, SWOT RN transported pt for discharge with wife.

## 2024-03-19 NOTE — Progress Notes (Signed)
 PT Cancellation Note  Patient Details Name: COLEN ELTZROTH MRN: [id number] DOB: [birth year]   Cancelled Treatment:    Reason Eval/Treat Not Completed: Other (comment) (pt with MD, will return)   Rosser Collington B Mayerly Kaman 03/19/2024, 8:47 AM Lenoard SQUIBB, PT Acute Rehabilitation Services Office: [phone]

## 2024-03-19 NOTE — Progress Notes (Signed)
 [facility] Progress Note   Subjective:    Seen and examined patient at bedside. Tolerated yesterday's HD with net UF 2.5L. No active bleeding from AVF per HD RN. Plan to hold Heparin  bolus with HD in outpatient and plan for discharge today. Patient will get HD tomorrow in outpatient.  Objective Vitals:   03/18/24 2030 03/18/24 2336 03/19/24 0407 03/19/24 1111  BP: 135/72 (!) 140/75 133/72 135/68  Pulse: 100 98 94 95  Resp: 12 14 15 17   Temp: 98.2 F (36.8 C) 98.8 F (37.1 C) 98.9 F (37.2 C) 97.7 F (36.5 C)  TempSrc: Oral Oral Oral Oral  SpO2: 100% 99% 99% 99%  Weight:   71 kg   Height:       Physical Exam General: Awake, alert, NAD Heart:RRR; No murmurs Lungs: Clear throughout Abdomen: Soft and non-tender Extremities: No LE edema Dialysis Access: AVF (+) B/T   Filed Weights   03/18/24 1645 03/18/24 1705 03/19/24 0407  Weight: 74 kg 74 kg 71 kg    Intake/Output Summary (Last 24 hours) at 03/19/2024 1234 Last data filed at 03/19/2024 9177 Gross per 24 hour  Intake 480 ml  Output 2500 ml  Net -2020 ml    Additional Objective Labs: Basic Metabolic Panel: Recent Labs  Lab 03/17/24 0603 03/18/24 0242 03/19/24 0254  NA 134* 132* 135  K 4.8 6.0* 4.2  CL 93* 91* 93*  CO2 27 25 30   GLUCOSE 117* 101* 134*  BUN 52* 69* 38*  CREATININE 6.67* 7.79* 5.40*  CALCIUM  8.9 8.8* 8.9   Liver Function Tests: No results for input(s): AST, ALT, ALKPHOS, BILITOT, PROT, ALBUMIN  in the last 168 hours. No results for input(s): LIPASE, AMYLASE in the last 168 hours. CBC: Recent Labs  Lab 03/16/24 2140 03/17/24 0603 03/18/24 0242 03/19/24 0254  WBC 5.7 5.5 7.1 5.8  NEUTROABS 3.4  --   --   --   HGB 9.4* 9.0* 8.8* 8.6*  HCT 29.1* 27.1* 26.6* 26.1*  MCV 99.7 99.3 97.1 97.8  PLT 143* 149* 155 149*   Blood Culture    Component Value Date/Time   SDES ABSCESS 03/19/2021 1709   SPECREQUEST LEFT PSOAS 03/19/2021 1709   CULT  03/19/2021 1709     FEW STAPHYLOCOCCUS AUREUS NO ANAEROBES ISOLATED Performed at [facility], [address] [place], [place] [address]    REPTSTATUS 03/24/2021 FINAL 03/19/2021 1709    Cardiac Enzymes: No results for input(s): CKTOTAL, CKMB, CKMBINDEX, TROPONINI in the last 168 hours. CBG: Recent Labs  Lab 03/18/24 1130 03/18/24 1522 03/18/24 2135 03/19/24 0623 03/19/24 1102  GLUCAP 132* 219* 83 121* 169*   Iron  Studies: No results for input(s): IRON , TIBC, TRANSFERRIN, FERRITIN in the last 72 hours. Lab Results  Component Value Date   INR 1.1 03/19/2021   Studies/Results: No results found.  Medications:  diltiazem  (CARDIZEM ) infusion Stopped (03/18/24 1615)    amiodarone   400 mg Oral BID   atorvastatin   20 mg Oral QHS   Chlorhexidine  Gluconate Cloth  6 each Topical Q0600   insulin  aspart  0-5 Units Subcutaneous QHS   insulin  aspart  0-6 Units Subcutaneous TID WC   levothyroxine   88 mcg Oral QAC breakfast   multivitamin  1 tablet Oral QHS   sevelamer  carbonate  1,600 mg Oral TID with meals   sodium zirconium cyclosilicate   5 g Oral Daily    Dialysis Orders: [facility] M[facility] 3h   B400   66.7kg  2K bath  AVF   Heparin  2200 Last OP HD 8/4, post wt 71kg (3-4kg up) Gets to dry wt 50% of the time, good compliance CXR - stable CM, no edema/ congestion  Assessment/Plan: Bleeding from AVF: oozing blood from L AVF after HD Monday. Stopped in ED w/ surgicel dressing. Is on full-dose eliquis . Will dc his heparin  with dialysis and see if this helps w/ the post hd bleeding (eliquis  was added to his home meds about 3 wks ago). If not could consider fistulogram vs lower eliquis  to lower dose (2.5 bid). No active bleeding per HD RN. SVT/ bradycardia/ atypical atrial flutter: being seen by EP team.  ESRD: on HD MWF. Missed HD 8/6. Received HD 8/7 here. Plan for HD 8/9 in outpatient BP: bp's are wnl here, not on bp lowering agents at home.   Volume: looks euvolemic on exam,  CXR no edema or congestion. 5kg up by wt. Goal UF 3 L w/ HD.  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  Chronic CHF: last EF 50% in July 2025.  DM2 Dispo - Okay for discharge from a renal standpoint. Plan to resume HD tomorrow in outpatient then he can resume HD MWF next week. Will hold Heparin  bolus with HD and will consider ordering a F'gram in outpatient if bleeding continues.  Charmaine Piety, NP [facility] 03/19/2024,12:34 PM  LOS: 1 day

## 2024-03-19 NOTE — Evaluation (Signed)
 Physical Therapy Evaluation/DC Patient Details Name: Ryan Wells MRN: [id number] DOB: January 13, 1956 Today's Date: 03/19/2024  History of Present Illness  68 yo male presents 03/16/24 for pre-syncope, atypical aflutter and bleeding from his LUE AV fistula. PMH: HTN, CHF, CAD, T2DM, ESRD on HD MWF, hypothyroidism, AAA, gastroparesis  Clinical Impression  Pt presents with pre-syncope and decreased activity tolerance. Pt ambulated short community distance without any signs of orthostasis or LOB. Pt able to go up/down a flight of stairs using railing on L and increased time. Pt is aware of safety concerns and uses AD as needed. Pt is near baseline with functional mobility and will continue to progress with increased activity. PT to sign off for now.     orthostatics supine 136/77 (95), sitting 147/78 (94), standing 145/83 (102) HR 94 maintained     If plan is discharge home, recommend the following: Assist for transportation;A little help with walking and/or transfers   Can travel by private vehicle        Equipment Recommendations None recommended by PT  Recommendations for Other Services       Functional Status Assessment Patient has had a recent decline in their functional status and demonstrates the ability to make significant improvements in function in a reasonable and predictable amount of time.     Precautions / Restrictions Precautions Precautions: Fall;Other (comment) Recall of Precautions/Restrictions: Intact Precaution/Restrictions Comments: LUE Fistula Restrictions Weight Bearing Restrictions Per Provider Order: No      Mobility  Bed Mobility Overal bed mobility: Modified Independent                  Transfers Overall transfer level: Modified independent Equipment used: None                    Ambulation/Gait Ambulation/Gait assistance: Contact guard assist Gait Distance (Feet): 300 Feet   Gait Pattern/deviations: Step-through pattern,  Decreased stride length       General Gait Details: Maintained hip flexion. Increased ER and eversion on R. No significant LOB  Stairs Stairs: Yes Stairs assistance: Contact guard assist Stair Management: One rail Left, Alternating pattern Number of Stairs: 11 General stair comments: Safe using the rail  Wheelchair Mobility     Tilt Bed    Modified Rankin (Stroke Patients Only)       Balance Overall balance assessment: Needs assistance Sitting-balance support: No upper extremity supported, Feet supported Sitting balance-Leahy Scale: Good     Standing balance support: No upper extremity supported, During functional activity Standing balance-Leahy Scale: Good                               Pertinent Vitals/Pain Pain Assessment Pain Assessment: No/denies pain    Home Living Family/patient expects to be discharged to:: Private residence Living Arrangements: Spouse/significant other;Children Available Help at Discharge: Family;Available PRN/intermittently Type of Home: House Home Access: Stairs to enter   Entrance Stairs-Number of Steps: 1 Alternate Level Stairs-Number of Steps: 12 Home Layout: Two level;Bed/bath upstairs Home Equipment: Grab bars - tub/shower;Rolling Walker (2 wheels);Cane - single point;Wheelchair - manual      Prior Function Prior Level of Function : Independent/Modified Independent             Mobility Comments: Typically uses a single point cane at home and a walker for community distances ADLs Comments: His son's drives him to appointments. Cooks occasionally     Extremity/Trunk Assessment   Upper Extremity  Assessment Upper Extremity Assessment: Overall WFL for tasks assessed;LUE deficits/detail LUE Deficits / Details: Hand slightly swollen    Lower Extremity Assessment Lower Extremity Assessment: Overall WFL for tasks assessed    Cervical / Trunk Assessment Cervical / Trunk Assessment: Normal  Communication    Communication Communication: No apparent difficulties (Interpreter used)    Cognition Arousal: Alert Behavior During Therapy: WFL for tasks assessed/performed   PT - Cognitive impairments: No apparent impairments                         Following commands: Intact       Cueing Cueing Techniques: Verbal cues, Gestural cues     General Comments General comments (skin integrity, edema, etc.): BP 140s/70s with positional changes, no sign of orthostatis. HR remained in 90s during activity.    Exercises     Assessment/Plan    PT Assessment Patient does not need any further PT services  PT Problem List Decreased strength;Decreased activity tolerance;Decreased balance;Decreased mobility       PT Treatment Interventions Gait training;Stair training;Functional mobility training;Therapeutic activities    PT Goals (Current goals can be found in the Care Plan section)  Acute Rehab PT Goals Patient Stated Goal: Return home PT Goal Formulation: All assessment and education complete, DC therapy Time For Goal Achievement: 04/02/24 Potential to Achieve Goals: Good    Frequency Min 1X/week     Co-evaluation               AM-PAC PT 6 Clicks Mobility  Outcome Measure Help needed turning from your back to your side while in a flat bed without using bedrails?: None Help needed moving from lying on your back to sitting on the side of a flat bed without using bedrails?: None Help needed moving to and from a bed to a chair (including a wheelchair)?: None Help needed standing up from a chair using your arms (e.g., wheelchair or bedside chair)?: None Help needed to walk in hospital room?: A Little Help needed climbing 3-5 steps with a railing? : A Little 6 Click Score: 22    End of Session Equipment Utilized During Treatment: Gait belt Activity Tolerance: Patient tolerated treatment well Patient left: in chair;with call bell/phone within reach;with nursing/sitter in  room Nurse Communication: Mobility status PT Visit Diagnosis: Unsteadiness on feet (R26.81);Muscle weakness (generalized) (M62.81)    Time: 8981-8952 PT Time Calculation (min) (ACUTE ONLY): 29 min   Charges:   PT Evaluation $PT Eval Low Complexity: 1 Low PT Treatments $Gait Training: 8-22 mins PT General Charges $$ ACUTE PT VISIT: 1 Visit         Quintin Campi, SPT  Acute Rehab  [phone]   Quintin Campi 03/19/2024, 11:35 AM

## 2024-03-19 NOTE — Discharge Planning (Signed)
 [facility] Patient Discharge Orders- [facility] CLINIC: [facility]  Patient's name: Ryan Wells Admit/DC Dates: 03/16/2024 - 03/19/2024  Please note: Recently admitted 7/28-03/12/24 for hyperkalemia and arrhythmias. Cardiology discharged on zio patch  Discharge Diagnoses: Pre-syncope, zio patch alarming: Cardiology/EP following, deferred pacer and recommended Amiodarone  Bleeding from AVF-see below on detailed plan Hypothyroidism: TSH 11 one week ago, managed by PCP  Aranesp : Given: No    Last Hgb: 8.6 PRBC's Given: No ESA dose for discharge: continue mircera 150 mcg IV q 2 weeks  IV Iron  dose at discharge: n/a  Heparin  change: Hold heparin  bolus 2nd bleeding from AVF  EDW Change: No  Bath Change: No  Access intervention/Change: No Details: See below  Calcitriol  change: No  Discharge Labs: Calcium  8.9  Phosphorus 5.2 8/1  Albumin  3.5 from 8/1  K+ 4.2  IV Antibiotics: No  On Coumadin?: No. On Eliquis  5mg  BID   OTHER/APPTS/LAB ORDERS:  Please note, patient came in with oozing coming from AVF. Bleeding stopped in ED with surgical dressing. Intervention was NOT done here. We stopped heparin  bolus to see if still helps. Notify renal team if bleeding re-occurs. At that point, will plan for F'gram. Patient is on Eliquis  5mg  BID and may need to consider lowering dosing but would need to d/w Cardiology.  Noted he also had some L arm swelling here. AVF with good bruit and thrill. Monitor closely and notify renal team if L arm swelling worsens.  D/C Meds to be reconciled by nurse after every discharge.  Completed By: Charmaine Piety, NP   Reviewed by: MD:______ RN_______

## 2024-03-23 ENCOUNTER — Telehealth: Payer: Self-pay | Admitting: Internal Medicine

## 2024-03-23 ENCOUNTER — Ambulatory Visit: Payer: Medicare HMO | Admitting: Internal Medicine

## 2024-03-23 NOTE — Telephone Encounter (Signed)
 Patient called and states can not make it to his scheduled appointment for today due to lack of transportation.  Patient states he would like to be on wait list for a next available appointment.

## 2024-03-23 NOTE — Telephone Encounter (Signed)
 Patient has been scheduled

## 2024-03-23 NOTE — Telephone Encounter (Signed)
Called patient to offer appointment, patient did not answer.

## 2024-03-24 ENCOUNTER — Encounter: Payer: Self-pay | Admitting: Internal Medicine

## 2024-03-24 ENCOUNTER — Ambulatory Visit (INDEPENDENT_AMBULATORY_CARE_PROVIDER_SITE_OTHER): Admitting: Internal Medicine

## 2024-03-24 ENCOUNTER — Telehealth: Payer: Self-pay | Admitting: Cardiology

## 2024-03-24 VITALS — BP 120/60 | HR 95 | Temp 98.3°F | Resp 17 | Ht 62.0 in | Wt 150.0 lb

## 2024-03-24 DIAGNOSIS — R600 Localized edema: Secondary | ICD-10-CM

## 2024-03-24 DIAGNOSIS — E875 Hyperkalemia: Secondary | ICD-10-CM | POA: Diagnosis not present

## 2024-03-24 DIAGNOSIS — Z992 Dependence on renal dialysis: Secondary | ICD-10-CM

## 2024-03-24 DIAGNOSIS — N186 End stage renal disease: Secondary | ICD-10-CM

## 2024-03-24 DIAGNOSIS — I4891 Unspecified atrial fibrillation: Secondary | ICD-10-CM | POA: Diagnosis not present

## 2024-03-24 MED ORDER — LOKELMA 5 G PO PACK: 5.0000 g | PACK | Freq: Every day | ORAL | 1 refills | Status: AC

## 2024-03-24 NOTE — Progress Notes (Signed)
 Subjective:    Patient ID: Ryan Wells, male   DOB: 1956/03/06, 68 y.o.   MRN: [id number]   HPI  Ryan Wells interprets  Hospitalization x 2 follow up.   ESRD with recent episodes of Hyperkalemia:  ran out of Lokelma  5 mg daily yesterday.  For some reason, was only given 7 out of the 30 doses ordered at discharge.  He did have HD today, but did not ask them whether to continue.  Had bloodwork today with [facility].    2.   Also, with rhythm issue during first hospitalization, atypical aflutter with bradycardia and episode of syncope.  Initiated Eliquis  5 mg daily. Placed on long term monitor--was called by monitor company and told to come to ED as also symptomatic.  Was called by cardiology fellow 2 d later and told to come in and he admitted presyncope symptoms.  He gives multiple reasons why he did not come in. Finally came in 2 days later due to oozing AV fistula.   Rapid atrial flutter when potassium 6.0 HD2 with second hospitalization.  Dialyzed and initiated on Amiodarone  400 mg twice daily.  He is to decrease to 200 mg twice daily tomorrow, Aug, 14th.  To decrease to 200 mg once daily Aug 21.    Has follow up with Cardiology on Aug 20  He has had not symptoms of light headedness outside of HD or palpitations since discharge.  States when he was feeling poorly at home prior to both hospitalizations, he had left anterior chest pressure.  I do not see that described in his history with the hospitalizations.    3.  Hypothyroidism:  TSH 11.820 in hospital.  States he never misses his Levothyroxine  88 mcg daily.  Levothyroxine  dosing not changed due to rhythm issues.  TSH 04/2023 in therapeutic range.   Current Meds  Medication Sig   acetaminophen  (TYLENOL ) 325 MG tablet Take 2 tablets (650 mg total) by mouth every 6 (six) hours as needed for mild pain (or Fever >/= 101).   amiodarone  (PACERONE ) 200 MG tablet Take 400 mg (2 tabs) twice daily for 1 week then take 200 mg (1 tab)  twice daily for 1 week then take 200 mg once daily from then on   apixaban  (ELIQUIS ) 5 MG TABS tablet Take 1 tablet (5 mg total) by mouth 2 (two) times daily.   atorvastatin  (LIPITOR) 20 MG tablet TAKE 1 TABLET BY MOUTH DAILY WITH THE EVENING MEAL (Patient taking differently: Take 20 mg by mouth at bedtime.)   B Complex-C-Folic Acid  (DIALYVITE 800) 0.8 MG TABS Take 1 tablet by mouth at bedtime.   levothyroxine  (SYNTHROID ) 88 MCG tablet TAKE 1 TABLET(88 MCG) BY MOUTH DAILY   sevelamer  carbonate (RENVELA ) 800 MG tablet Take 1,600 mg by mouth with breakfast, with lunch, and with evening meal.   sodium zirconium cyclosilicate  (LOKELMA ) 5 g packet Take 5 g by mouth daily.   No Known Allergies   Review of Systems    Objective:   BP 120/60 (BP Location: Left Arm, Patient Position: Sitting)   Pulse 95   Temp 98.3 F (36.8 C)   Resp 17   Ht 5' 2 (1.575 m)   Wt 150 lb (68 kg)   SpO2 99%   BMI 27.44 kg/m   Physical Exam Cardiovascular:     Rate and Rhythm: Normal rate. Extrasystoles (only when lies down with occasional early beat and pause) are present.    Pulses:  Carotid pulses are 2+ on the right side and 2+ on the left side.      Radial pulses are 1+ on the right side. Left radial pulse not accessible.       Dorsalis pedis pulses are 1+ on the right side and 1+ on the left side.     Heart sounds: S1 normal and S2 normal. No murmur heard.    No S3 or S4 sounds.     Comments: Edema to knees bilaterally with thickening of skin and discoloration.   Pulmonary:     Breath sounds: Normal air entry.     Comments: Bilateral light crackles at bases bilaterally, but unwilling to cough to see if clears. Effort limited Abdominal:     General: Bowel sounds are normal.     Palpations: Abdomen is soft. There is no mass.     Tenderness: There is no abdominal tenderness.  Musculoskeletal:     Right lower leg: 2+ Pitting Edema present.     Left lower leg: 2+ Pitting Edema present.       Assessment & Plan   ESRD with recurrent hyperkalemia  and related cardiac rhythm issues:  Called and left message on 03/25/2024 with [facility] Clinical Manager with questions about continuing Lokelma  and whether there is a different HD unit he might tolerated better as tolerated better at [facility]  [phone].  Ultimately, received call from Dr. Macel, [facility], who stated they do not have similar units, but that patient may have been dialyzed over a longer time period or not a full dialysis treatment.  He did agree Ryan Wells should continue the Lokelma  as his potassium seems to be climbing in between regular dialysis treatments.  Prescription previously sent and patient told to continue unless we called and told him to stop  2.  LE edema:  Encouraged him to recline and elevate legs when sitting.  Sounds like he sits with legs dependent at home frequently.  They state they are monitoring sodium intake and not using a potassium containing salt substitute.  3.  Atrial fibrillation/flutter with rapid ventricular response:  Amiodarone  and follow up with cardiology  4.  Hypothyroidism:  holding on change for levothyroxine  until cardiac issue clarified.  Recheck TSH in 1 month.

## 2024-03-24 NOTE — Telephone Encounter (Signed)
   Cardiac Monitor Alert  Date of alert:  03/24/2024   Patient Name: Ryan Wells  DOB: Oct 05, 1955  MRN: [id number]   [facility] Cardiologist: Lonni LITTIE Nanas, MD  [facility] EP:  None    Monitor Information: Long Term Monitor-Live Telemetry [ZioAT]Long Term Monitor [ZioXT]  Reason:  Syncope and collapse Ordering provider:  Per Dr Okey and Nanas to read   Alert Atrial Fibrillation/Flutter This is the 1st alert for this rhythm.  The patient has a hx of Atrial Fibrillation/Flutter.  The patient is not currently on anticoagulation.  Anticoagulation medication as of 03/24/2024           apixaban  (ELIQUIS ) 5 MG TABS tablet Take 1 tablet (5 mg total) by mouth 2 (two) times daily.       Next Cardiology Appointment   Date:    03/31/24  Provider: Rollo Louder, PA    Other: EOS report. 60 second Aflutter at 178. Only event on report. Pt has history of Afib and Aflutter. Will route to reading provider. Report posted.

## 2024-03-24 NOTE — Telephone Encounter (Signed)
 [facility] reporting abnormal EKG results Ref: [id number]

## 2024-03-25 ENCOUNTER — Ambulatory Visit (HOSPITAL_COMMUNITY): Admission: RE | Admit: 2024-03-25 | Source: Home / Self Care | Admitting: Vascular Surgery

## 2024-03-25 SURGERY — A/V SHUNT INTERVENTION
Anesthesia: LOCAL | Site: Arm Upper | Laterality: Left

## 2024-03-31 ENCOUNTER — Ambulatory Visit: Admitting: Cardiology

## 2024-04-11 NOTE — Progress Notes (Deleted)
  Cardiology Office Note   Date:  04/11/2024  ID:  Ryan Wells 01-Jan-1956, MRN [id number] PCP: Adella Norris, MD  [facility] Providers Cardiologist:  Lonni LITTIE Nanas, MD   History of Present Illness Ryan Wells is a 68 y.o. male with a past medical history of  Atrial Flutter, bradycardia  Developed atrial flutter vs SVT during dialysis in 02/2024. Also had sinus bradycardia, junctional bradycardia, idioventricular rhythm, transient pauses, NSVT, SVT noted on tele. These occurred in the setting of hyperkalemia. Once potassium corrected, rhythms were normal  Formal read from zio patch in 03/2024 pending, but detected frequent episodes of atrial fibrillation, atrial flutter, frequent ectopy, some brief NSVT, as well as some significant pauses, the longest being 6.5 seconds  HFimpEF  ESRD on HD  HTN  Type 2 DM, diabetic gastroparesis Prior psoas abscess with MSSA bacteremia  Anemia of chronic disease, thrombocytopenia  AAA Hypothyroidism    Discussed the use of AI scribe software for clinical note transcription with the patient, who gave verbal consent to proceed.  History of Present Illness      ROS: ***  Studies Reviewed      *** Risk Assessment/Calculations {Does this patient have ATRIAL FIBRILLATION?:[id number]} No BP recorded.  {Refresh Note OR Click here to enter BP  :1}***       Physical Exam VS:  There were no vitals taken for this visit.       Wt Readings from Last 3 Encounters:  03/24/24 150 lb (68 kg)  03/19/24 156 lb 8.4 oz (71 kg)  03/12/24 154 lb 12.2 oz (70.2 kg)    GEN: Well nourished, well developed in no acute distress NECK: No JVD; No carotid bruits CARDIAC: ***RRR, no murmurs, rubs, gallops RESPIRATORY:  Clear to auscultation without rales, wheezing or rhonchi  ABDOMEN: Soft, non-tender, non-distended EXTREMITIES:  No edema; No deformity   ASSESSMENT AND PLAN ***    {Are you ordering a CV Procedure  (e.g. stress test, cath, DCCV, TEE, etc)?   Press F2        :[id number]}  Dispo: ***  Signed, Orren LOISE Fabry, PA-C

## 2024-04-13 ENCOUNTER — Ambulatory Visit: Attending: Physician Assistant | Admitting: Physician Assistant

## 2024-04-13 DIAGNOSIS — I714 Abdominal aortic aneurysm, without rupture, unspecified: Secondary | ICD-10-CM

## 2024-04-13 DIAGNOSIS — R079 Chest pain, unspecified: Secondary | ICD-10-CM

## 2024-04-13 DIAGNOSIS — R55 Syncope and collapse: Secondary | ICD-10-CM

## 2024-04-13 DIAGNOSIS — E039 Hypothyroidism, unspecified: Secondary | ICD-10-CM

## 2024-04-13 DIAGNOSIS — N186 End stage renal disease: Secondary | ICD-10-CM

## 2024-04-13 DIAGNOSIS — E785 Hyperlipidemia, unspecified: Secondary | ICD-10-CM

## 2024-04-13 DIAGNOSIS — I10 Essential (primary) hypertension: Secondary | ICD-10-CM

## 2024-04-15 ENCOUNTER — Encounter (HOSPITAL_COMMUNITY)
Admission: RE | Disposition: A | Discharge status: Home / Self Care | Payer: Self-pay | Source: Home / Self Care | Attending: Surgery

## 2024-04-15 ENCOUNTER — Encounter (HOSPITAL_COMMUNITY): Payer: Self-pay | Admitting: Surgery

## 2024-04-15 ENCOUNTER — Other Ambulatory Visit: Payer: Self-pay

## 2024-04-15 ENCOUNTER — Ambulatory Visit (HOSPITAL_COMMUNITY)
Admission: RE | Admit: 2024-04-15 | Discharge: 2024-04-15 | Disposition: A | Discharge status: Home / Self Care | Attending: Surgery | Admitting: Surgery

## 2024-04-15 DIAGNOSIS — N186 End stage renal disease: Secondary | ICD-10-CM | POA: Diagnosis not present

## 2024-04-15 DIAGNOSIS — Y832 Surgical operation with anastomosis, bypass or graft as the cause of abnormal reaction of the patient, or of later complication, without mention of misadventure at the time of the procedure: Secondary | ICD-10-CM | POA: Diagnosis not present

## 2024-04-15 DIAGNOSIS — Z992 Dependence on renal dialysis: Secondary | ICD-10-CM | POA: Insufficient documentation

## 2024-04-15 DIAGNOSIS — T82858A Stenosis of vascular prosthetic devices, implants and grafts, initial encounter: Secondary | ICD-10-CM | POA: Diagnosis not present

## 2024-04-15 LAB — GLUCOSE, CAPILLARY: Glucose-Capillary: 85 mg/dL (ref 70–99)

## 2024-04-15 SURGERY — A/V SHUNT INTERVENTION
Anesthesia: LOCAL | Site: Arm Upper | Laterality: Left

## 2024-04-15 MED ORDER — IODIXANOL 320 MG/ML IV SOLN
INTRAVENOUS | Status: DC | PRN
  Administered 2024-04-15: 25 mL via INTRAVENOUS

## 2024-04-15 MED ORDER — HEPARIN (PORCINE) IN NACL 1000-0.9 UT/500ML-% IV SOLN
INTRAVENOUS | Status: DC | PRN
  Administered 2024-04-15: 500 mL

## 2024-04-15 MED ORDER — LIDOCAINE HCL (PF) 1 % IJ SOLN
INTRAMUSCULAR | Status: DC | PRN
  Administered 2024-04-15: 2 mL via SUBCUTANEOUS

## 2024-04-15 MED ORDER — LIDOCAINE HCL (PF) 1 % IJ SOLN
INTRAMUSCULAR | Status: AC
  Filled 2024-04-15: qty 30

## 2024-04-15 SURGICAL SUPPLY — 9 items
BALLOON MUSTANG 10X60X75 (BALLOONS) IMPLANT
BALLOON MUSTANG 8X60X75 (BALLOONS) IMPLANT
KIT ENCORE 26 ADVANTAGE (KITS) IMPLANT
KIT MICROPUNCTURE NIT STIFF (SHEATH) IMPLANT
SHEATH PINNACLE R/O II 6F 4CM (SHEATH) IMPLANT
SHEATH PROBE COVER 6X72 (BAG) IMPLANT
TRAY PV CATH (CUSTOM PROCEDURE TRAY) ×2 IMPLANT
TUBING CIL FLEX 10 FLL-RA (TUBING) IMPLANT
WIRE BENTSON .035X145CM (WIRE) IMPLANT

## 2024-04-15 NOTE — H&P (Signed)
   Patient name: Ryan Wells MRN: [id number] DOB: November 20, 1955 Sex: male  REASON FOR VISIT:    Dialysis access issues  HISTORY OF PRESENT ILLNESS:   Ryan Wells is a 68 y.o. male who had a left basilic vein fistula created by Dr. Eliza in 2020.  He is having trouble with access issues and is here for fistulogram  CURRENT MEDICATIONS:    No current facility-administered medications for this encounter.    REVIEW OF SYSTEMS:   [X]  denotes positive finding, [ ]  denotes negative finding Cardiac  Comments:  Chest pain or chest pressure:    Shortness of breath upon exertion:    Short of breath when lying flat:    Irregular heart rhythm:    Constitutional    Fever or chills:      PHYSICAL EXAM:   Vitals:   04/15/24 0850 04/15/24 0900  BP: (!) 140/84 132/82  Pulse: 100 99  Resp: 12 14  Temp: 98 F (36.7 C)   TempSrc: Oral   SpO2: 100% 97%    GENERAL: The patient is a well-nourished male, in no acute distress. The vital signs are documented above. CARDIOVASCULAR: There is a regular rate and rhythm. PULMONARY: Non-labored respirations Palpable thrill within fistula  STUDIES:      MEDICAL ISSUES:   ESRD: I discussed proceeding with fistulogram to better evaluate the fistula and to intervene if a stenosis is identified.  All questions were answered.  Malvina Serene CLORE, MD, FACS [facility] [phone] Pager [phone]

## 2024-04-15 NOTE — Op Note (Signed)
    Patient name: Ryan Wells MRN: [id number] DOB: 1955-10-19 Sex: male  04/15/2024 Pre-operative Diagnosis: ESRS Post-operative diagnosis:  Same Surgeon:  Malvina New Procedure Performed:  1.  Ultrasound-guided access, left basilic vein  2.  Fistulogram  3.  Venoplasty, left subclavian vein (central)    Indications: This is a 68 year old gentleman who is having difficulty with bleeding after dialysis.  He comes in today for fistulogram  Procedure:  The patient was identified in the holding area and taken to room 8.  The patient was then placed supine on the table and prepped and draped in the usual sterile fashion.  A time out was called.  Uultrasound was used to evaluate the fistula.  The vein was patent and compressible.  A digital ultrasound image was acquired.  The fistula was then accessed under ultrasound guidance using a micropuncture needle.  An 018 wire was then asvanced without resistance and a micropuncture sheath was placed.  Contrast injections were then performed through the sheath.  Findings: The arteriovenous anastomosis is widely patent.  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  There is a stent in the midportion of the fistula that is widely patent.  The central venous system is patent however there is a 60% stenosis within the subclavian vein   Intervention: After the above images were acquired the decision was made to proceed with intervention.  Over a Bentson wire, a 6 French sheath was placed.  I initially treated the lesion with an 8 mm Mustang balloon with suboptimal results and so I upsized to a 10 mm balloon and took this to 20 atm.  Completion imaging revealed resolution of the stenosis to less than 20%.  Catheters and wires were removed.  The sheath was removed and a Monocryl was used to close the site  Impression:  #1  Successful central venoplasty of a left subclavian vein stenosis using a 10 mm balloon  #2  Fistula remains amenable to percutaneous  intervention   V. Malvina New, M.D., [facility] [place] Office: [phone] Pager:  [phone]

## 2024-04-24 ENCOUNTER — Other Ambulatory Visit: Payer: Self-pay

## 2024-04-24 ENCOUNTER — Observation Stay (HOSPITAL_COMMUNITY)
Admission: EM | Admit: 2024-04-24 | Discharge: 2024-04-25 | Disposition: A | Discharge status: Home / Self Care | Attending: Infectious Diseases | Admitting: Infectious Diseases

## 2024-04-24 ENCOUNTER — Emergency Department (HOSPITAL_COMMUNITY)

## 2024-04-24 ENCOUNTER — Encounter (HOSPITAL_COMMUNITY): Payer: Self-pay | Admitting: Infectious Diseases

## 2024-04-24 DIAGNOSIS — E875 Hyperkalemia: Secondary | ICD-10-CM | POA: Insufficient documentation

## 2024-04-24 DIAGNOSIS — Z79899 Other long term (current) drug therapy: Secondary | ICD-10-CM | POA: Diagnosis not present

## 2024-04-24 DIAGNOSIS — I120 Hypertensive chronic kidney disease with stage 5 chronic kidney disease or end stage renal disease: Secondary | ICD-10-CM | POA: Diagnosis not present

## 2024-04-24 DIAGNOSIS — E039 Hypothyroidism, unspecified: Secondary | ICD-10-CM | POA: Insufficient documentation

## 2024-04-24 DIAGNOSIS — N186 End stage renal disease: Secondary | ICD-10-CM | POA: Insufficient documentation

## 2024-04-24 DIAGNOSIS — I4891 Unspecified atrial fibrillation: Secondary | ICD-10-CM | POA: Insufficient documentation

## 2024-04-24 DIAGNOSIS — M8668 Other chronic osteomyelitis, other site: Secondary | ICD-10-CM | POA: Diagnosis present

## 2024-04-24 DIAGNOSIS — E1122 Type 2 diabetes mellitus with diabetic chronic kidney disease: Secondary | ICD-10-CM | POA: Insufficient documentation

## 2024-04-24 DIAGNOSIS — D696 Thrombocytopenia, unspecified: Secondary | ICD-10-CM | POA: Diagnosis not present

## 2024-04-24 DIAGNOSIS — I4719 Other supraventricular tachycardia: Secondary | ICD-10-CM | POA: Diagnosis not present

## 2024-04-24 DIAGNOSIS — E878 Other disorders of electrolyte and fluid balance, not elsewhere classified: Secondary | ICD-10-CM | POA: Diagnosis not present

## 2024-04-24 DIAGNOSIS — M4626 Osteomyelitis of vertebra, lumbar region: Secondary | ICD-10-CM | POA: Insufficient documentation

## 2024-04-24 DIAGNOSIS — R000 Tachycardia, unspecified: Secondary | ICD-10-CM | POA: Diagnosis present

## 2024-04-24 DIAGNOSIS — Z992 Dependence on renal dialysis: Secondary | ICD-10-CM | POA: Insufficient documentation

## 2024-04-24 DIAGNOSIS — D649 Anemia, unspecified: Secondary | ICD-10-CM | POA: Insufficient documentation

## 2024-04-24 DIAGNOSIS — I2510 Atherosclerotic heart disease of native coronary artery without angina pectoris: Secondary | ICD-10-CM | POA: Insufficient documentation

## 2024-04-24 LAB — CBC WITH DIFFERENTIAL/PLATELET
Abs Immature Granulocytes: 0.02 K/uL (ref 0.00–0.07)
Basophils Absolute: 0.1 K/uL (ref 0.0–0.1)
Basophils Relative: 1 %
Eosinophils Absolute: 0 K/uL (ref 0.0–0.5)
Eosinophils Relative: 0 %
HCT: 40.3 % (ref 39.0–52.0)
Hemoglobin: 12.5 g/dL — ABNORMAL LOW (ref 13.0–17.0)
Immature Granulocytes: 0 %
Lymphocytes Relative: 20 %
Lymphs Abs: 1.3 K/uL (ref 0.7–4.0)
MCH: 32.1 pg (ref 26.0–34.0)
MCHC: 31 g/dL (ref 30.0–36.0)
MCV: 103.3 fL — ABNORMAL HIGH (ref 80.0–100.0)
Monocytes Absolute: 0.7 K/uL (ref 0.1–1.0)
Monocytes Relative: 10 %
Neutro Abs: 4.4 K/uL (ref 1.7–7.7)
Neutrophils Relative %: 69 %
Platelets: 101 K/uL — ABNORMAL LOW (ref 150–400)
RBC: 3.9 MIL/uL — ABNORMAL LOW (ref 4.22–5.81)
RDW: 15.3 % (ref 11.5–15.5)
WBC: 6.4 K/uL (ref 4.0–10.5)
nRBC: 0 % (ref 0.0–0.2)

## 2024-04-24 LAB — TSH: TSH: 33.492 u[IU]/mL — ABNORMAL HIGH (ref 0.350–4.500)

## 2024-04-24 LAB — BASIC METABOLIC PANEL WITH GFR
Anion gap: 15 (ref 5–15)
BUN: 42 mg/dL — ABNORMAL HIGH (ref 8–23)
CO2: 24 mmol/L (ref 22–32)
Calcium: 9.5 mg/dL (ref 8.9–10.3)
Chloride: 97 mmol/L — ABNORMAL LOW (ref 98–111)
Creatinine, Ser: 8.03 mg/dL — ABNORMAL HIGH (ref 0.61–1.24)
GFR, Estimated: 7 mL/min — ABNORMAL LOW (ref >60)
Glucose, Bld: 124 mg/dL — ABNORMAL HIGH (ref 70–99)
Potassium: 6.8 mmol/L (ref 3.5–5.1)
Sodium: 136 mmol/L (ref 135–145)

## 2024-04-24 LAB — TROPONIN I (HIGH SENSITIVITY)
Troponin I (High Sensitivity): 22 ng/L — ABNORMAL HIGH (ref <18)
Troponin I (High Sensitivity): 22 ng/L — ABNORMAL HIGH (ref <18)

## 2024-04-24 LAB — MAGNESIUM: Magnesium: 3 mg/dL — ABNORMAL HIGH (ref 1.7–2.4)

## 2024-04-24 LAB — PHOSPHORUS: Phosphorus: 3.1 mg/dL (ref 2.5–4.6)

## 2024-04-24 LAB — HEMOGLOBIN A1C
Hgb A1c MFr Bld: 4.8 % (ref 4.8–5.6)
Mean Plasma Glucose: 91.06 mg/dL

## 2024-04-24 LAB — CBG MONITORING, ED
Glucose-Capillary: 103 mg/dL — ABNORMAL HIGH (ref 70–99)
Glucose-Capillary: 111 mg/dL — ABNORMAL HIGH (ref 70–99)

## 2024-04-24 LAB — HEPATITIS B SURFACE ANTIGEN: Hepatitis B Surface Ag: NONREACTIVE

## 2024-04-24 MED ORDER — LIDOCAINE-PRILOCAINE 2.5-2.5 % EX CREA: 1.0000 | TOPICAL_CREAM | CUTANEOUS | Status: DC | PRN

## 2024-04-24 MED ORDER — HEPARIN SODIUM (PORCINE) 1000 UNIT/ML DIALYSIS: 1000.0000 [IU] | INTRAMUSCULAR | Status: DC | PRN

## 2024-04-24 MED ORDER — ALTEPLASE 2 MG IJ SOLR: 2.0000 mg | Freq: Once | INTRAMUSCULAR | Status: DC | PRN

## 2024-04-24 MED ORDER — CHLORHEXIDINE GLUCONATE CLOTH 2 % EX PADS
6.0000 | MEDICATED_PAD | Freq: Every day | CUTANEOUS | Status: DC
  Administered 2024-04-24 – 2024-04-25 (×2): 6 via TOPICAL

## 2024-04-24 MED ORDER — AMIODARONE HCL IN DEXTROSE 360-4.14 MG/200ML-% IV SOLN
60.0000 mg/h | INTRAVENOUS | Status: AC
  Administered 2024-04-24: 60 mg/h via INTRAVENOUS
  Filled 2024-04-24: qty 200

## 2024-04-24 MED ORDER — PENTAFLUOROPROP-TETRAFLUOROETH EX AERO: 1.0000 | INHALATION_SPRAY | CUTANEOUS | Status: DC | PRN

## 2024-04-24 MED ORDER — CALCIUM GLUCONATE-NACL 1-0.675 GM/50ML-% IV SOLN
1.0000 g | Freq: Once | INTRAVENOUS | Status: AC
  Administered 2024-04-24: 1000 mg via INTRAVENOUS
  Filled 2024-04-24: qty 50

## 2024-04-24 MED ORDER — INSULIN ASPART 100 UNIT/ML IV SOLN
5.0000 [IU] | Freq: Once | INTRAVENOUS | Status: AC
  Administered 2024-04-24: 5 [IU] via INTRAVENOUS

## 2024-04-24 MED ORDER — SODIUM ZIRCONIUM CYCLOSILICATE 10 G PO PACK
10.0000 g | PACK | Freq: Once | ORAL | Status: AC
  Administered 2024-04-24: 10 g via ORAL
  Filled 2024-04-24: qty 1

## 2024-04-24 MED ORDER — LEVOTHYROXINE SODIUM 88 MCG PO TABS
88.0000 ug | ORAL_TABLET | Freq: Every day | ORAL | Status: DC
  Administered 2024-04-25: 88 ug via ORAL
  Filled 2024-04-24: qty 1

## 2024-04-24 MED ORDER — ATORVASTATIN CALCIUM 10 MG PO TABS
20.0000 mg | ORAL_TABLET | Freq: Every day | ORAL | Status: DC
  Administered 2024-04-24: 20 mg via ORAL
  Filled 2024-04-24: qty 2

## 2024-04-24 MED ORDER — LIDOCAINE HCL (PF) 1 % IJ SOLN: 5.0000 mL | INTRAMUSCULAR | Status: DC | PRN

## 2024-04-24 MED ORDER — APIXABAN 5 MG PO TABS
5.0000 mg | ORAL_TABLET | Freq: Two times a day (BID) | ORAL | Status: DC
  Administered 2024-04-24 – 2024-04-25 (×3): 5 mg via ORAL
  Filled 2024-04-24 (×3): qty 1

## 2024-04-24 MED ORDER — AMIODARONE HCL IN DEXTROSE 360-4.14 MG/200ML-% IV SOLN
30.0000 mg/h | INTRAVENOUS | Status: DC
  Administered 2024-04-24 (×2): 30 mg/h via INTRAVENOUS
  Filled 2024-04-24: qty 200

## 2024-04-24 MED ORDER — ANTICOAGULANT SODIUM CITRATE 4% (200MG/5ML) IV SOLN
5.0000 mL | Status: DC | PRN
  Filled 2024-04-24: qty 5

## 2024-04-24 MED ORDER — DEXTROSE 50 % IV SOLN
1.0000 | Freq: Once | INTRAVENOUS | Status: AC
  Administered 2024-04-24: 50 mL via INTRAVENOUS
  Filled 2024-04-24: qty 50

## 2024-04-24 NOTE — Plan of Care (Signed)
  Problem: Education: Goal: Knowledge of General Education information will improve Description: Including pain rating scale, medication(s)/side effects and non-pharmacologic comfort measures Outcome: Progressing   Problem: Clinical Measurements: Goal: Respiratory complications will improve Outcome: Progressing Goal: Cardiovascular complication will be avoided Outcome: Progressing   Problem: Nutrition: Goal: Adequate nutrition will be maintained Outcome: Progressing   Problem: Coping: Goal: Level of anxiety will decrease Outcome: Progressing   Problem: Pain Managment: Goal: General experience of comfort will improve and/or be controlled Outcome: Progressing   Problem: Skin Integrity: Goal: Risk for impaired skin integrity will decrease Outcome: Progressing

## 2024-04-24 NOTE — Progress Notes (Incomplete)
 Internal Medicine Teaching Service Attending Note Date: 04/24/2024  Patient name: Ryan Wells  Medical record number: [id number]  Date of birth: 1956-02-23   I have seen and evaluated Ryan Wells and discussed their care with the Residency Team.   68 yo M with hx of ESRD who went to HD today and was found to be tachycardic and hypotensive. He had been off his home eliquis  and amiodarone .  He was sent to ED and was found to have K 6.8. As well he was found to be in afib with rvr.  He was given insulin  and calcium . Started on an amiodarone  drip.  Troponin 22/22 Denies CP/SOB.   Physical Exam: Blood pressure 123/70, pulse 86, temperature 97.7 F (36.5 C), resp. rate 14, height 5' 2 (1.575 m), weight 68 kg, SpO2 100%. General appearance: alert, cooperative, and no distress Eyes: negative findings: pupils equal, round, reactive to light and accomodation Throat: normal findings: oropharynx pink & moist without lesions or evidence of thrush Neck: no adenopathy and supple, symmetrical, trachea midline Resp: clear to auscultation bilaterally Cardio: irregularly irregular rhythm GI: normal findings: bowel sounds normal and soft, non-tender Extremities: edema none and RUE fistula +bruit.   Lab results: Results for orders placed or performed during the hospital encounter of 04/24/24 (from the past 24 hours)  Basic metabolic panel     Status: Abnormal   Collection Time: 04/24/24  8:03 AM  Result Value Ref Range   Sodium 136 135 - 145 mmol/L   Potassium 6.8 (HH) 3.5 - 5.1 mmol/L   Chloride 97 (L) 98 - 111 mmol/L   CO2 24 22 - 32 mmol/L   Glucose, Bld 124 (H) 70 - 99 mg/dL   BUN 42 (H) 8 - 23 mg/dL   Creatinine, Ser 1.96 (H) 0.61 - 1.24 mg/dL   Calcium  9.5 8.9 - 10.3 mg/dL   GFR, Estimated 7 (L) >60 mL/min   Anion gap 15 5 - 15  CBC with Differential     Status: Abnormal   Collection Time: 04/24/24  8:03 AM  Result Value Ref Range   WBC 6.4 4.0 - 10.5 K/uL   RBC 3.90 (L)  4.22 - 5.81 MIL/uL   Hemoglobin 12.5 (L) 13.0 - 17.0 g/dL   HCT 59.6 60.9 - 47.9 %   MCV 103.3 (H) 80.0 - 100.0 fL   MCH 32.1 26.0 - 34.0 pg   MCHC 31.0 30.0 - 36.0 g/dL   RDW 84.6 88.4 - 84.4 %   Platelets 101 (L) 150 - 400 K/uL   nRBC 0.0 0.0 - 0.2 %   Neutrophils Relative % 69 %   Neutro Abs 4.4 1.7 - 7.7 K/uL   Lymphocytes Relative 20 %   Lymphs Abs 1.3 0.7 - 4.0 K/uL   Monocytes Relative 10 %   Monocytes Absolute 0.7 0.1 - 1.0 K/uL   Eosinophils Relative 0 %   Eosinophils Absolute 0.0 0.0 - 0.5 K/uL   Basophils Relative 1 %   Basophils Absolute 0.1 0.0 - 0.1 K/uL   Immature Granulocytes 0 %   Abs Immature Granulocytes 0.02 0.00 - 0.07 K/uL  Troponin I (High Sensitivity)     Status: Abnormal   Collection Time: 04/24/24  8:03 AM  Result Value Ref Range   Troponin I (High Sensitivity) 22 (H) <18 ng/L  Magnesium      Status: Abnormal   Collection Time: 04/24/24  8:03 AM  Result Value Ref Range   Magnesium  3.0 (H) 1.7 - 2.4 mg/dL  Troponin I (High Sensitivity)     Status: Abnormal   Collection Time: 04/24/24  9:55 AM  Result Value Ref Range   Troponin I (High Sensitivity) 22 (H) <18 ng/L  TSH     Status: Abnormal   Collection Time: 04/24/24  9:55 AM  Result Value Ref Range   TSH 33.492 (H) 0.350 - 4.500 uIU/mL  Hepatitis B surface antigen     Status: None   Collection Time: 04/24/24 10:37 AM  Result Value Ref Range   Hepatitis B Surface Ag NON REACTIVE NON REACTIVE  CBG monitoring, ED     Status: Abnormal   Collection Time: 04/24/24 10:56 AM  Result Value Ref Range   Glucose-Capillary 103 (H) 70 - 99 mg/dL  CBG monitoring, ED     Status: Abnormal   Collection Time: 04/24/24 11:01 AM  Result Value Ref Range   Glucose-Capillary 111 (H) 70 - 99 mg/dL    Imaging results:  DG Chest Port 1 View Result Date: 04/24/2024 CLINICAL DATA:  68 year old male with palpitation, hypertensive at dialysis center this morning and no dialysis was performed. EXAM: PORTABLE CHEST 1 VIEW  COMPARISON:  Portable chest 03/16/2024 and earlier. FINDINGS: Portable AP semi upright view at 0714 hours. Stable cardiomegaly and mediastinal contours. Visualized tracheal air column is within normal limits. Lung volumes normal, mildly improved. Allowing for portable technique the lungs are clear. No pneumothorax or pleural effusion. Stable visualized osseous structures. Cervical ACDF hardware. Left upper extremity/axillary vascular stent partially visible. Paucity bowel gas. IMPRESSION: Stable cardiomegaly. No acute cardiopulmonary abnormality. Electronically Signed   By: VEAR Hurst M.D.   On: 04/24/2024 07:37    Assessment and Plan: I agree with the formulated Assessment and Plan with the following changes:  Afib with RVR ESRD DM2 Hypothyroid  Appreciate renal f/u He is currently in HD Will reinforce need for med adherence.  Consider his thyroxine dosing.  Anti-hypertensives held until BP improves (HR improves) Appreciate CV f/u Loaded with IV amiodarone    Eben, Reyes BROCKS, MD

## 2024-04-24 NOTE — ED Notes (Signed)
RN transported pt to dialysis.

## 2024-04-24 NOTE — Consult Note (Signed)
 CONSULT NOTE    Patient ID: Ryan Wells MRN: [id number], DOB/AGE: 01-23-1956 68 y.o.  Admit date: 04/24/2024 Date of Consult: 04/24/2024  Primary Physician: Ryan Norris, MD Primary Cardiologist: Ryan LITTIE Nanas, MD   Referring Provider: Dr. Towana  Patient Profile: Ryan Wells is a 68 y.o. male with a history of ESRD on HD on MWF, hypertension, diabetes, prior psoas abscess with MSSA bacteremia, atrial flutter, bradycardia, syncope, anemia of chronic disease, thrombocytopenia, AAA, diabetic gastroparesis, hypothyroidism who is being seen today for the evaluation of atypical atrial flutter vs SVT at the request of Dr. Towana.  HPI:   He was recently hospitalized from 7/28-03/12/2024 for hyperkalemia, was noted to develop atypical atrial flutter vs SVT during dialysis.  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  It seems that last time all of these issues were primarily in the setting of hyperkalemia.    Telemetry also noted sinus bradycardia and pauses which were felt to be associated with hyperkalemia. Monitor was ordered at discharge to follow.     Admitted 8/6 with oozing from his fistula and noted to be tachycardia. Cardiology consulted given history above.  Monitor reviewed which showed paroxysms of atrial arrhythmia and a post conversion pause of > 6 seconds. EP evaluated patient and recommended Amiodarone  for atrial flutter management/to prevent post conversion pauses.  Patient presented to the ED today after being found with elevated HR at dialysis center. Patient without awareness of rapid HR, denies shortness of breath, chest pain. He reports not taking Amiodarone  after running out of the prescription given at time of  most recent d/c. He never picked up/took Eliquis  due to prohibitive cost.  Labs              .    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  Did not get outpatient stress testing following hospitalization.  No chest pain since   Chronic kidney disease    on dialysis Tues, Thurs and Sat   Diabetes mellitus without complication (HCC)    Diabetes type 2, uncontrolled 11/25/2011   no meds, diet controlled per patient   Diabetic gastroparesis (HCC) 01/22/2017   Hypertension    no meds   Microalbuminuria 01/19/2017   Myocardial infarction [facility]) 2015     Surgical History:  Past Surgical History:  Procedure Laterality Date   A/V FISTULAGRAM Left 06/04/2019   Procedure: A/V FISTULAGRAM;  Surgeon: Eliza Ryan RAMAN, MD;  Location: [facility] INVASIVE CV LAB;  Service: Cardiovascular;  Laterality: Left;   A/V FISTULAGRAM Left 10/15/2019   Procedure: A/V FISTULAGRAM;  Surgeon: Eliza Ryan RAMAN, MD;  Location: [facility] INVASIVE CV LAB;  Service: Cardiovascular;  Laterality: Left;   A/V FISTULAGRAM N/A 01/24/2021   Procedure: A/V FISTULAGRAM - Left Upper;  Surgeon: Magda Debby SAILOR, MD;  Location: [facility] INVASIVE CV LAB;  Service: Cardiovascular;  Laterality: N/A;   A/V SHUNT INTERVENTION Left 04/15/2024   Procedure: A/V SHUNT INTERVENTION;  Surgeon: Serene Gaile ORN, MD;  Location: [facility] PV LAB;  Service: Cardiovascular;  Laterality: Left;   AV FISTULA PLACEMENT Left 07/02/2018   Procedure: BRACHIOCEPHALIC ARTERIOVENOUS (AV) FISTULA CREATION LEFT ARM;  Surgeon: Serene Gaile ORN, MD;  Location: [facility] OR;  Service: Vascular;  Laterality: Left;   BASCILIC VEIN TRANSPOSITION Left 07/26/2019   Procedure: Bascilic Vein Transposition;  Surgeon: Eliza Ryan RAMAN, MD;  Location[facility] OR;  Service: Vascular;  Laterality: Left;   COLONOSCOPY      EMBOLIZATION (CATH LAB) Left 06/04/2019   Procedure: EMBOLIZATION;  Surgeon: Eliza Ryan RAMAN, MD;  Location: [facility] INVASIVE CV LAB;  Service: Cardiovascular;  Laterality: Left;  LT ARM FISTULA/COMPETING BRANCH   EYE SURGERY Bilateral    cataracts x2   EYE SURGERY     INSERTION OF DIALYSIS CATHETER Right 06/08/2019   Procedure: INSERTION OF PALINDROME DIALYSIS CATHETER IN RIGHT INTERNAL JUGULAR;  Surgeon: Eliza Ryan RAMAN, MD;  Location[facility] OR;  Service: Vascular;  Laterality: Right;   LIGATION OF ARTERIOVENOUS  FISTULA Left 07/26/2019   Procedure: Ligation Of BrachioCephalic  Fistula;  Surgeon: Eliza Ryan RAMAN, MD;  Location: [facility] OR;  Service: Vascular;  Laterality: Left;   NECK SURGERY     PERIPHERAL VASCULAR BALLOON ANGIOPLASTY Left 06/04/2019   Procedure: PERIPHERAL VASCULAR BALLOON ANGIOPLASTY;  Surgeon: Eliza Ryan RAMAN, MD;  Location: [facility] INVASIVE CV LAB;  Service: Cardiovascular;  Laterality: Left;  ARM FISTULA   PERIPHERAL VASCULAR BALLOON ANGIOPLASTY Left 10/15/2019   Procedure: PERIPHERAL VASCULAR BALLOON ANGIOPLASTY;  Surgeon: Eliza Ryan RAMAN, MD;  Location: [facility] INVASIVE CV LAB;  Service: Cardiovascular;  Laterality: Left;  arm fistula   TEE WITHOUT CARDIOVERSION N/A 01/19/2021   Procedure: TRANSESOPHAGEAL ECHOCARDIOGRAM (TEE);  Surgeon: Jeffrie Oneil BROCKS, MD;  Location: [facility] ENDOSCOPY;  Service: Cardiovascular;  Laterality: N/A;   VENOUS ANGIOPLASTY  04/15/2024   Procedure: VENOUS ANGIOPLASTY;  Surgeon: Serene Gaile ORN, MD;  Location: [facility] PV LAB;  Service: Cardiovascular;;  Subclavian Vein     (Not in a hospital admission)   Inpatient Medications:   Allergies: No Known Allergies  Family History  Problem Relation Age of Onset   Heart disease Mother        cause of death--CHF?   Diabetes Father    Kidney disease Father        Dialysis for kidney failure   Diabetes Sister    Diabetes Brother    Colon cancer Neg Hx    Stomach cancer Neg Hx     Esophageal cancer Neg Hx      Physical Exam: Vitals:   04/24/24 0700 04/24/24 0715 04/24/24 0730 04/24/24 0800  BP: 98/81 98/77 92/76  108/83  Pulse: (!) 153 (!) 55 (!) 125 (!) 147  Resp: 12 15 18 15   Temp:      TempSrc:      SpO2: 100% 100% 100% 100%  Weight:      Height:        GEN- NAD, A&O x 3, normal affect HEENT: Normocephalic, atraumatic Lungs- CTAB, Normal effort.  Heart- Regular rhythm, rapid rate. No M/G/R.  GI- Soft, NT, ND.  Extremities- No clubbing, cyanosis. Trace bilateral LE edema.   Radiology/Studies: DG Chest Port 1 View Result Date: 04/24/2024 CLINICAL DATA:  68 year old male with palpitation, hypertensive at dialysis center this morning and no dialysis was performed. EXAM: PORTABLE CHEST 1 VIEW COMPARISON:  Portable chest 03/16/2024 and earlier. FINDINGS: Portable AP semi upright view at 0714 hours. Stable cardiomegaly and mediastinal contours. Visualized tracheal air column is within normal limits. Lung volumes normal, mildly improved. Allowing for portable technique the lungs are clear. No pneumothorax or pleural effusion. Stable visualized osseous structures. Cervical ACDF hardware. Left upper extremity/axillary vascular  stent partially visible. Paucity bowel gas. IMPRESSION: Stable cardiomegaly. No acute cardiopulmonary abnormality. Electronically Signed   By: VEAR Hurst M.D.   On: 04/24/2024 07:37   PERIPHERAL VASCULAR CATHETERIZATION Result Date: 04/15/2024 Images from the original result were not included. Patient name: DAISHON CHUI MRN: [id number] DOB: [birth year] Sex: male 04/15/2024 Pre-operative Diagnosis: ESRS Post-operative diagnosis:  Same Surgeon:  Malvina New Procedure Performed:  1.  Ultrasound-guided access, left basilic vein  2.  Fistulogram  3.  Venoplasty, left subclavian vein (central) Indications: This is a 68 year old gentleman who is having difficulty with bleeding after dialysis.  He comes in today for fistulogram Procedure:  The patient was  identified in the holding area and taken to room 8.  The patient was then placed supine on the table and prepped and draped in the usual sterile fashion.  A time out was called.  Uultrasound was used to evaluate the fistula.  The vein was patent and compressible.  A digital ultrasound image was acquired.  The fistula was then accessed under ultrasound guidance using a micropuncture needle.  An 018 wire was then asvanced without resistance and a micropuncture sheath was placed.  Contrast injections were then performed through the sheath. Findings: The arteriovenous anastomosis is widely patent.  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  There is a stent in the midportion of the fistula that is widely patent.  The central venous system is patent however there is a 60% stenosis within the subclavian vein  Intervention: After the above images were acquired the decision was made to proceed with intervention.  Over a Bentson wire, a 6 French sheath was placed.  I initially treated the lesion with an 8 mm Mustang balloon with suboptimal results and so I upsized to a 10 mm balloon and took this to 20 atm.  Completion imaging revealed resolution of the stenosis to less than 20%.  Catheters and wires were removed.  The sheath was removed and a Monocryl was used to close the site Impression:  #1  Successful central venoplasty of a left subclavian vein stenosis using a 10 mm balloon  #2  Fistula remains amenable to percutaneous intervention V. Malvina New, M.D., [facility] [place] Office: [phone] Pager:  [phone]    EKG: atrial tachycardia with PAC interruption. HR 148 (personally reviewed)  TELEMETRY: atrial tachycardia with intermittent interruption from non-conducted PACs (personally reviewed)  Assessment/Plan:  Atrial tachycardia with PACs Hx atypical atrial flutter Secondary hypercoagulable state  Telemetry and ECG this admission shows atrial tachycardia  intermittently interrupted by PACs. Previous admission had atypical atrial flutter as well complicated by post-conversion pauses. Plan at that time was to manage arrhythmia with Amiodarone /Eliquis  but due to prohibitive cost, never started Eliquis . Recurrent tachycardia today prevented patient from undergoing HD. Challenging situation as with lower HR/BP at baseline, unlikely to tolerate therapeutic doses of AV nodal agents. Recommend reloading IV Amiodarone  with conversion to oral regimen. This can be followed by PO load: 400 mg BID x 1 week, 200 mg BID x 1 week, then 200 mg daily.  Resume Eliquis . Need to engage [facility] team regarding medication cost assistance.  ESRD Hyperkalemia Elevated Magnesium  and Potassium today. Pending HD.      For questions or updates, please contact [facility] Please consult [email or web address] for contact info under     Signed, Artist Pouch, PA-C  04/24/2024, 8:43 AM

## 2024-04-24 NOTE — Progress Notes (Signed)
   04/24/24 1710  Vitals  Temp 97.6 F (36.4 C)  Pulse Rate 86  Resp 12  BP 126/69  SpO2 100 %  O2 Device Room Air  Weight  (ed stretcher---no weight)  Type of Weight Post-Dialysis  Oxygen Therapy  Patient Activity (if Appropriate) In bed  Post Treatment  Dialyzer Clearance Lightly streaked  Hemodialysis Intake (mL) 0 mL  Liters Processed 72  Fluid Removed (mL) 1000 mL  Tolerated HD Treatment Yes  AVG/AVF Arterial Site Held (minutes) 15 minutes  AVG/AVF Venous Site Held (minutes) 10 minutes   Received patient in bed to unit.  Alert and oriented.  Informed consent signed and in chart.   TX duration:3.0  Patient tolerated well.  Transported back to the room  Alert, without acute distress.  Hand-off given to patient's nurse.   Access used: LUAF Access issues: no complications  Total UF removed: 1000 Medication(s) given: pt on Amiodorone IV gtt at 30 an hour   [facility]

## 2024-04-24 NOTE — ED Notes (Signed)
 CRITICAL VALUE STICKER  CRITICAL VALUE:Potassium 6.8  RECEIVER (on-site recipient of call):I. Janit, RN  DATE & TIME NOTIFIED: 0930 04/24/24  MESSENGER (representative from lab):Lab  MD NOTIFIED: Towana  TIME OF NOTIFICATION:0930  RESPONSE:

## 2024-04-24 NOTE — ED Provider Notes (Signed)
 [place] EMERGENCY DEPARTMENT AT [facility] Provider Note   CSN: [id number] Arrival date & time: 04/24/24  9351     Patient presents with: Tachycardia   Ryan Wells is a 68 y.o. male.  He has a history of end-stage renal disease and gets dialysis Monday Wednesday Friday.  He went Wednesday.  Did not go yesterday so went to dialysis today.  There they found his heart rate to be elevated and called the ambulance.  He does not feel like his heart rate is elevated.  He said this has been a problem in the past though.  He denies any chest pain shortness of breath fevers chills.  He said he has not had much of an appetite.  It looks like he follows with cardiology here and has a history of atrial flutter and is on amiodarone  and Eliquis .   The history is provided by the patient. The history is limited by a language barrier. A language interpreter was used (spanish [phone]).       Prior to Admission medications   Medication Sig Start Date End Date Taking? Authorizing Provider  acetaminophen  (TYLENOL ) 325 MG tablet Take 2 tablets (650 mg total) by mouth every 6 (six) hours as needed for mild pain (or Fever >/= 101). 04/07/21   Ghimire, Kuber, MD  amiodarone  (PACERONE ) 200 MG tablet Take 400 mg (2 tabs) twice daily for 1 week then take 200 mg (1 tab) twice daily for 1 week then take 200 mg once daily from then on 03/19/24   Jonel Lonni SQUIBB, MD  apixaban  (ELIQUIS ) 5 MG TABS tablet Take 1 tablet (5 mg total) by mouth 2 (two) times daily. 02/23/24 03/24/24  Christobal Guadalajara, MD  atorvastatin  (LIPITOR) 20 MG tablet TAKE 1 TABLET BY MOUTH DAILY WITH THE EVENING MEAL Patient taking differently: Take 20 mg by mouth at bedtime. 05/23/23   Adella Norris, MD  B Complex-C-Folic Acid  (DIALYVITE 800) 0.8 MG TABS Take 1 tablet by mouth at bedtime.    [provider]  levothyroxine  (SYNTHROID ) 88 MCG tablet TAKE 1 TABLET(88 MCG) BY MOUTH DAILY 05/23/23   Adella Norris, MD   sevelamer  carbonate (RENVELA ) 800 MG tablet Take 1,600 mg by mouth with breakfast, with lunch, and with evening meal. 01/09/24   [provider]  sodium zirconium cyclosilicate  (LOKELMA ) 5 g packet Take 5 g by mouth daily. 03/24/24   Adella Norris, MD    Allergies: Patient has no known allergies.    Review of Systems  Constitutional:  Negative for fever.  Respiratory:  Negative for shortness of breath.   Cardiovascular:  Negative for chest pain.  Gastrointestinal:  Negative for abdominal pain.    Updated Vital Signs BP 106/68   Pulse (!) 153   Temp 97.8 F (36.6 C) (Oral)   Resp 14   Ht 5' 2 (1.575 m)   Wt 68 kg   SpO2 100%   BMI 27.42 kg/m   Physical Exam Vitals and nursing note reviewed.  Constitutional:      Appearance: Normal appearance. He is well-developed.  HENT:     Head: Normocephalic and atraumatic.  Eyes:     Conjunctiva/sclera: Conjunctivae normal.  Cardiovascular:     Rate and Rhythm: Regular rhythm. Tachycardia present.     Heart sounds: No murmur heard. Pulmonary:     Effort: Pulmonary effort is normal. No respiratory distress.     Breath sounds: Normal breath sounds.  Abdominal:     Palpations: Abdomen is soft.  Tenderness: There is no abdominal tenderness. There is no guarding or rebound.  Musculoskeletal:        General: No deformity.     Cervical back: Neck supple.  Skin:    General: Skin is warm and dry.  Neurological:     General: No focal deficit present.     Mental Status: He is alert.     GCS: GCS eye subscore is 4. GCS verbal subscore is 5. GCS motor subscore is 6.     (all labs ordered are listed, but only abnormal results are displayed) Labs Reviewed  BASIC METABOLIC PANEL WITH GFR - Abnormal; Notable for the following components:      Result Value   Potassium 6.8 (*)    Chloride 97 (*)    Glucose, Bld 124 (*)    BUN 42 (*)    Creatinine, Ser 8.03 (*)    GFR, Estimated 7 (*)    All other components within  normal limits  CBC WITH DIFFERENTIAL/PLATELET - Abnormal; Notable for the following components:   RBC 3.90 (*)    Hemoglobin 12.5 (*)    MCV 103.3 (*)    Platelets 101 (*)    All other components within normal limits  MAGNESIUM  - Abnormal; Notable for the following components:   Magnesium  3.0 (*)    All other components within normal limits  TSH - Abnormal; Notable for the following components:   TSH 33.492 (*)    All other components within normal limits  CBG MONITORING, ED - Abnormal; Notable for the following components:   Glucose-Capillary 103 (*)    All other components within normal limits  CBG MONITORING, ED - Abnormal; Notable for the following components:   Glucose-Capillary 111 (*)    All other components within normal limits  TROPONIN I (HIGH SENSITIVITY) - Abnormal; Notable for the following components:   Troponin I (High Sensitivity) 22 (*)    All other components within normal limits  TROPONIN I (HIGH SENSITIVITY) - Abnormal; Notable for the following components:   Troponin I (High Sensitivity) 22 (*)    All other components within normal limits  HEPATITIS B SURFACE ANTIGEN  HEPATITIS B SURFACE ANTIBODY, QUANTITATIVE  HEMOGLOBIN A1C    EKG: EKG Interpretation Date/Time:  Saturday April 24 2024 12:20:25 EDT Ventricular Rate:  162 PR Interval:  77 QRS Duration:  113 QT Interval:  275 QTC Calculation: 452 R Axis:   133  Text Interpretation: Atrial fibrillation with rapid V-rate Borderline intraventricular conduction delay Probable RVH w/ secondary repol abnormality Repolarization abnormality, prob rate related Confirmed by Towana Sharper [phone]) on 04/24/2024 12:25:52 PM  Radiology: [facility] Chest Port 1 View Result Date: 04/24/2024 CLINICAL DATA:  68 year old male with palpitation, hypertensive at dialysis center this morning and no dialysis was performed. EXAM: PORTABLE CHEST 1 VIEW COMPARISON:  Portable chest 03/16/2024 and earlier. FINDINGS: Portable AP semi  upright view at 0714 hours. Stable cardiomegaly and mediastinal contours. Visualized tracheal air column is within normal limits. Lung volumes normal, mildly improved. Allowing for portable technique the lungs are clear. No pneumothorax or pleural effusion. Stable visualized osseous structures. Cervical ACDF hardware. Left upper extremity/axillary vascular stent partially visible. Paucity bowel gas. IMPRESSION: Stable cardiomegaly. No acute cardiopulmonary abnormality. Electronically Signed   By: VEAR Hurst M.D.   On: 04/24/2024 07:37     .Critical Care  Performed by: Towana Sharper BROCKS, MD Authorized by: Towana Sharper BROCKS, MD   Critical care provider statement:    Critical care time (minutes):  45   Critical care time was exclusive of:  Separately billable procedures and treating other patients   Critical care was necessary to treat or prevent imminent or life-threatening deterioration of the following conditions:  Cardiac failure and metabolic crisis   Critical care was time spent personally by me on the following activities:  Development of treatment plan with patient or surrogate, discussions with consultants, evaluation of patient's response to treatment, examination of patient, obtaining history from patient or surrogate, ordering and performing treatments and interventions, ordering and review of laboratory studies, ordering and review of radiographic studies, pulse oximetry, re-evaluation of patient's condition and review of old charts   I assumed direction of critical care for this patient from another provider in my specialty: no      Medications Ordered in the ED  amiodarone  (NEXTERONE  PREMIX) 360-4.14 MG/200ML-% (1.8 mg/mL) IV infusion (60 mg/hr Intravenous New Bag/Given 04/24/24 1052)  amiodarone  (NEXTERONE  PREMIX) 360-4.14 MG/200ML-% (1.8 mg/mL) IV infusion (has no administration in time range)  apixaban  (ELIQUIS ) tablet 5 mg (5 mg Oral Given 04/24/24 1003)  Chlorhexidine  Gluconate Cloth  2 % PADS 6 each (has no administration in time range)  pentafluoroprop-tetrafluoroeth (GEBAUERS) aerosol 1 Application (has no administration in time range)  lidocaine  (PF) (XYLOCAINE ) 1 % injection 5 mL (has no administration in time range)  lidocaine -prilocaine  (EMLA ) cream 1 Application (has no administration in time range)  heparin  injection 1,000 Units (has no administration in time range)  anticoagulant sodium citrate  solution 5 mL (has no administration in time range)  alteplase  (CATHFLO ACTIVASE ) injection 2 mg (has no administration in time range)  sodium zirconium cyclosilicate  (LOKELMA ) packet 10 g (10 g Oral Given 04/24/24 1003)  calcium  gluconate 1 g/ 50 mL sodium chloride  IVPB (0 mg Intravenous Stopped 04/24/24 1048)  insulin  aspart (novoLOG ) injection 5 Units (5 Units Intravenous Given 04/24/24 1004)    And  dextrose  50 % solution 50 mL (50 mLs Intravenous Given 04/24/24 1005)    Clinical Course as of 04/24/24 1147  Sat Apr 24, 2024  0833 Discussed with Dr. Shlomo cardiology.  She would not recommend cardioversion at this time as he is going in and out.  She said cardiology would come down and consult on patient. [MB]  0934 Patient's potassium came back critically elevated at 6.8.  I have initiated hyperkalemic treatment.  Paging nephrology. [MB]  [id number] Discussed with nephrology Dr. Dolan.  He will arrange for the patient to receive dialysis today. [MB]  [id number] Cardiology is ordering him some IV amiodarone .  I will put the patient in for unassigned admission.  Also some question whether he is taking his anticoagulation [MB]  1010 Discussed with Garrel from the internal medicine teaching service who will evaluate patient for admission. [MB]    Clinical Course User Index [MB] Towana Ozell BROCKS, MD                                 Medical Decision Making Amount and/or Complexity of Data Reviewed Labs: ordered. Radiology: ordered.  Risk OTC drugs. Prescription drug  management. Decision regarding hospitalization.   This patient complains of elevated heart rate; this involves an extensive number of treatment Options and is a complaint that carries with it a high risk of complications and morbidity. The differential includes sepsis, arrhythmia, anemia, metabolic derangement  I ordered, reviewed and interpreted labs, which included CBC unremarkable, chemistries with critically elevated potassium, troponins mildly elevated I  ordered medication IV medication and oral medication for hyperkalemia and reviewed PMP when indicated. I ordered imaging studies which included chest x-ray and I independently    visualized and interpreted imaging which showed cardiomegaly Previous records obtained and reviewed in epic including recent discharge summary and cardiology notes I consulted cardiology Dr. Shlomo, nephrology Dr. Dolan, internal medicine teaching service and discussed lab and imaging findings and discussed disposition.  Cardiac monitoring reviewed, atrial tachycardia versus A-fib flutter Social determinants considered, no significant barriers Critical Interventions: Initiation of treatment for hyperkalemia and management of his tachycardia  After the interventions stated above, I reevaluated the patient and found still to be tachycardic although fairly comfortable appearing Admission and further testing considered, he would benefit from mission to the hospital for dialysis and further cardiac management of his arrhythmia.  He is in agreement with plan for admission.      Final diagnoses:  Atrial tachycardia (HCC)  Hyperkalemia  End stage renal disease on dialysis [facility])    ED Discharge Orders     None          Towana Ozell BROCKS, MD 04/24/24 [phone]

## 2024-04-24 NOTE — Consult Note (Signed)
 [facility] Nephrology Consult Note: Reason for Consult: To manage dialysis and dialysis related needs Referring Physician: Dr. Towana, Ozell  HPI:  Ryan Wells is an 68 y.o. male with past medical history significant for hypertension, DM, CAD, AAA, ESRD on HD MWF who was presented with hypertension, tachycardia seen as a consultation for further evaluation management of ESRD and hyperkalemia. The patient has last dialysis on 9/10.  He went to HD today reportedly he was found to have tachycardia and elevated BP therefore sent to ER. He was recently hospitalized about a month ago for hyperkalemia, syncope when he developed SVT, tachybradycardia. In the ER, he was found to have blood pressure around 98-109 systolic however heart rate around 140s.  Seen by cardiologist team thought to be due to atrial tachycardia therefore recommending loading with IV amiodarone  and then oral.  Also on Eliquis .  The labs showed potassium level 6.8, BUN 42, hemoglobin 12.5.  Chest x-ray was unremarkable. Patient reports feeling well without any complaint or concern.  No headache, dizziness, nausea, vomiting, chest pain, shortness of breath. OP HD orders:  Dialyzes at [facility], MWF, 3 hr 45 mins, EDW 68.5 kg, 400/Autoflow 1.5, 2k2ca LUE  AVF.    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  Did not get outpatient stress testing following hospitalization.  No chest pain since   Chronic kidney disease    on dialysis Tues, Thurs and Sat   Diabetes mellitus without complication (HCC)    Diabetes type 2, uncontrolled 11/25/2011   no meds, diet controlled per patient   Diabetic gastroparesis (HCC) 01/22/2017   Hypertension    no meds   Microalbuminuria 01/19/2017   Myocardial infarction [facility]) 2015    Past Surgical History:   Procedure Laterality Date   A/V FISTULAGRAM Left 06/04/2019   Procedure: A/V FISTULAGRAM;  Surgeon: Eliza Lonni RAMAN, MD;  Location: [facility] INVASIVE CV LAB;  Service: Cardiovascular;  Laterality: Left;   A/V FISTULAGRAM Left 10/15/2019   Procedure: A/V FISTULAGRAM;  Surgeon: Eliza Lonni RAMAN, MD;  Location: [facility] INVASIVE CV LAB;  Service: Cardiovascular;  Laterality: Left;   A/V FISTULAGRAM N/A 01/24/2021   Procedure: A/V FISTULAGRAM - Left Upper;  Surgeon: Magda Debby SAILOR, MD;  Location: [facility] INVASIVE CV LAB;  Service: Cardiovascular;  Laterality: N/A;   A/V SHUNT INTERVENTION Left 04/15/2024   Procedure: A/V SHUNT INTERVENTION;  Surgeon: Serene Gaile ORN, MD;  Location: [facility] PV LAB;  Service: Cardiovascular;  Laterality: Left;   AV FISTULA PLACEMENT Left 07/02/2018   Procedure: BRACHIOCEPHALIC ARTERIOVENOUS (AV) FISTULA CREATION LEFT ARM;  Surgeon: Serene Gaile ORN, MD;  Location: [facility] OR;  Service: Vascular;  Laterality: Left;   BASCILIC VEIN TRANSPOSITION Left 07/26/2019   Procedure: Bascilic Vein Transposition;  Surgeon: Eliza Lonni RAMAN, MD;  Location: [facility] OR;  Service: Vascular;  Laterality: Left;   COLONOSCOPY     EMBOLIZATION (CATH LAB) Left 06/04/2019   Procedure: EMBOLIZATION;  Surgeon: Eliza Lonni RAMAN, MD;  Location: [facility] INVASIVE CV LAB;  Service: Cardiovascular;  Laterality: Left;  LT ARM FISTULA/COMPETING BRANCH   EYE SURGERY Bilateral    cataracts x2   EYE SURGERY     INSERTION OF DIALYSIS CATHETER Right 06/08/2019   Procedure: INSERTION OF PALINDROME DIALYSIS CATHETER IN RIGHT INTERNAL JUGULAR;  Surgeon: Eliza Lonni RAMAN, MD;  Location: [facility] OR;  Service: Vascular;  Laterality: Right;   LIGATION OF ARTERIOVENOUS  FISTULA Left 07/26/2019   Procedure: Ligation Of BrachioCephalic  Fistula;  Surgeon: Eliza Lonni RAMAN, MD;  Location: [facility] OR;  Service: Vascular;  Laterality: Left;   NECK SURGERY     PERIPHERAL VASCULAR BALLOON ANGIOPLASTY Left 06/04/2019    Procedure: PERIPHERAL VASCULAR BALLOON ANGIOPLASTY;  Surgeon: Eliza Lonni RAMAN, MD;  Location: [facility] INVASIVE CV LAB;  Service: Cardiovascular;  Laterality: Left;  ARM FISTULA   PERIPHERAL VASCULAR BALLOON ANGIOPLASTY Left 10/15/2019   Procedure: PERIPHERAL VASCULAR BALLOON ANGIOPLASTY;  Surgeon: Eliza Lonni RAMAN, MD;  Location: [facility] INVASIVE CV LAB;  Service: Cardiovascular;  Laterality: Left;  arm fistula   TEE WITHOUT CARDIOVERSION N/A 01/19/2021   Procedure: TRANSESOPHAGEAL ECHOCARDIOGRAM (TEE);  Surgeon: Jeffrie Oneil BROCKS, MD;  Location: [facility] ENDOSCOPY;  Service: Cardiovascular;  Laterality: N/A;   VENOUS ANGIOPLASTY  04/15/2024   Procedure: VENOUS ANGIOPLASTY;  Surgeon: Serene Gaile ORN, MD;  Location: [facility] PV LAB;  Service: Cardiovascular;;  Subclavian Vein    Family History  Problem Relation Age of Onset   Heart disease Mother        cause of death--CHF?   Diabetes Father    Kidney disease Father        Dialysis for kidney failure   Diabetes Sister    Diabetes Brother    Colon cancer Neg Hx    Stomach cancer Neg Hx    Esophageal cancer Neg Hx     Social History:  reports that he has quit smoking. He has never used smokeless tobacco. He reports that he does not currently use alcohol. He reports that he does not use drugs.  Allergies: No Known Allergies  Medications: I have reviewed the patient's current medications.   Results for orders placed or performed during the hospital encounter of 04/24/24 (from the past 48 hours)  Basic metabolic panel     Status: Abnormal   Collection Time: 04/24/24  8:03 AM  Result Value Ref Range   Sodium 136 135 - 145 mmol/L   Potassium 6.8 (HH) 3.5 - 5.1 mmol/L    Comment: CRITICAL RESULT CALLED TO, READ BACK BY AND VERIFIED WITH EVANS. I RN @ 0930 04/24/24 S OUROBANGNA   Chloride 97 (L) 98 - 111 mmol/L   CO2 24 22 - 32 mmol/L   Glucose, Bld 124 (H) 70 - 99 mg/dL    Comment: Glucose reference range applies only to samples taken after fasting  for at least 8 hours.   BUN 42 (H) 8 - 23 mg/dL   Creatinine, Ser 1.96 (H) 0.61 - 1.24 mg/dL   Calcium  9.5 8.9 - 10.3 mg/dL   GFR, Estimated 7 (L) >60 mL/min    Comment: (NOTE) Calculated using the CKD-EPI Creatinine Equation (2021)    Anion gap 15 5 - 15    Comment: Performed at [facility] Lab, [address], [place], [place] [address]  CBC with Differential     Status: Abnormal   Collection Time: 04/24/24  8:03 AM  Result Value Ref Range   WBC 6.4 4.0 - 10.5 K/uL   RBC 3.90 (L) 4.22 - 5.81 MIL/uL   Hemoglobin 12.5 (L) 13.0 - 17.0 g/dL   HCT 59.6 60.9 - 47.9 %   MCV 103.3 (H) 80.0 - 100.0 fL   MCH 32.1 26.0 - 34.0 pg   MCHC 31.0 30.0 - 36.0 g/dL   RDW 84.6 88.4 - 84.4 %   Platelets 101 (  L) 150 - 400 K/uL   nRBC 0.0 0.0 - 0.2 %   Neutrophils Relative % 69 %   Neutro Abs 4.4 1.7 - 7.7 K/uL   Lymphocytes Relative 20 %   Lymphs Abs 1.3 0.7 - 4.0 K/uL   Monocytes Relative 10 %   Monocytes Absolute 0.7 0.1 - 1.0 K/uL   Eosinophils Relative 0 %   Eosinophils Absolute 0.0 0.0 - 0.5 K/uL   Basophils Relative 1 %   Basophils Absolute 0.1 0.0 - 0.1 K/uL   Immature Granulocytes 0 %   Abs Immature Granulocytes 0.02 0.00 - 0.07 K/uL    Comment: Performed at [facility] Lab, [address], [place], [place] [address]  Troponin I (High Sensitivity)     Status: Abnormal   Collection Time: 04/24/24  8:03 AM  Result Value Ref Range   Troponin I (High Sensitivity) 22 (H) <18 ng/L    Comment: (NOTE) Elevated high sensitivity troponin I (hsTnI) values and significant  changes across serial measurements may suggest ACS but many other  chronic and acute conditions are known to elevate hsTnI results.  Refer to the Links section for chest pain algorithms and additional  guidance. Performed at [facility] Lab, [address] [place], [place] [address]   Magnesium      Status: Abnormal   Collection Time: 04/24/24  8:03 AM  Result Value Ref Range   Magnesium  3.0 (H) 1.7 - 2.4  mg/dL    Comment: Performed at [facility], [address] [place], [place] [address]    DG Chest Port 1 View Result Date: 04/24/2024 CLINICAL DATA:  68 year old male with palpitation, hypertensive at dialysis center this morning and no dialysis was performed. EXAM: PORTABLE CHEST 1 VIEW COMPARISON:  Portable chest 03/16/2024 and earlier. FINDINGS: Portable AP semi upright view at 0714 hours. Stable cardiomegaly and mediastinal contours. Visualized tracheal air column is within normal limits. Lung volumes normal, mildly improved. Allowing for portable technique the lungs are clear. No pneumothorax or pleural effusion. Stable visualized osseous structures. Cervical ACDF hardware. Left upper extremity/axillary vascular stent partially visible. Paucity bowel gas. IMPRESSION: Stable cardiomegaly. No acute cardiopulmonary abnormality. Electronically Signed   By: VEAR Hurst M.D.   On: 04/24/2024 07:37    ROS: Reviewed, as per H&P.  Rest of the review of system negative. Blood pressure 109/86, pulse (!) 149, temperature 97.8 F (36.6 C), temperature source Oral, resp. rate 17, height 5' 2 (1.575 m), weight 68 kg, SpO2 91%. Gen: NAD, comfortable Respiratory: Clear bilateral, no wheezing or crackle Cardiovascular: Regular rate rhythm S1-S2 normal, no rubs GI: Abdomen soft, nontender, nondistended Extremities, no cyanosis or clubbing, no edema Skin: No rash or ulcer Neurology: Alert, awake, following commands, oriented Dialysis Access: AV fistula has good thrill and bruit.  Assessment/Plan:  # Atrial tachycardia with PAC with history of A-flutter: Seen by cardiologist, plan is to load with amiodarone .  On Eliquis  for anticoagulation.  Being admitted for further evaluation.  # Hyperkalemia: Treating with calcium  gluconate, insulin /dextrose  and Lokelma .  Plan for dialysis today with low potassium bath.  Monitor lab.  # ESRD: MWF at [facility] kidney center, plan for HD today.  AV fistula for the  access.  # Hypertension: BP soft likely due to tachycardia, hold antihypertensives.    # Anemia of ESRD: Hemoglobin above goal, no need for ESA  # CKD-metabolic Bone Disease: Monitor, calcium  phosphorus.  Discussed with ICU team.  Mateo Amelie Romney 04/24/2024, 9:48 AM

## 2024-04-24 NOTE — H&P (Signed)
 Date: 04/24/2024               Patient Name:  Ryan Wells MRN: [id number]  DOB: 12-24-1955 Age / Sex: 68 y.o., male   PCP: Adella Norris, MD         Medical Service: Internal Medicine Teaching Service         Attending Physician: Dr. Reyes Fenton      First Contact: Schuyler Novak, DO    Second Contact: Dr. Ozell Kung, MD          Pager Information: First Contact Pager: [phone]   Second Contact Pager: [phone]   SUBJECTIVE   Chief Complaint: Rapid heart rate  History of Present Illness: Ryan Wells is a 68 y.o. male with PMH of hypertension, T2DM, ESRD on dialysis, CAD, chronic lumbar discitis/osteomyelitis who presented to the ER today after being told at dialysis that he had a rapid heart rate rate and would be unable to undergo session and present to the ER.  Upon arrival his potassium was elevated at 6.8 and he was given insulin , glucose, calcium  gluconate.  He was also evaluated by cardiology who placed him on amiodarone  infusion.  His tachyarrhythmia did resolve however it has intermittently reoccurred.  Upon examination he reports no symptoms including denying chest pain, shortness of breath, palpitations, dizziness, abdominal pain, nausea, vomiting, or peripheral swelling.  Upon review of medical records the patient has had multiple admissions within the past 2 months for tachycardia, sinus pauses, and intermittent bradycardia.  Most recently he was discharged on 8/8 from [facility] where he was treated for intermittent tachyarrhythmias with 2 to 6-second pauses that were seen on a Zio patch.  He was apparently symptomatic at home and frequently contacted by the Zio patch reps who suggested he come to the ER however he did not until his fistula began bleeding.  He was symptomatic while inpatient and evaluated by electrophysiology who deferred pacemaker placement and recommended amiodarone  upon discharge.  He was instructed to take amiodarone  and  titrate down to 200 mg daily however he was not taking this at home.   ED Course: Labs significant for potassium 6.8, creatinine 8.03, troponin 22 and flat, TSH 33.49 Imaging chest x-ray with stable cardiomegaly and no acute findings Received insulin  5 units, 1 amp dextrose , calcium  gluconate Amiodarone  60 mg/h Consulted cardiology, IMTS  Meds:  Patient reported: Levothyroxine   No outpatient medications have been marked as taking for the 04/24/24 encounter [facility] Encounter).    Past Medical History Hypothyroidism ESRD on HD Tachycardia Anemia of chronic disease Thrombocytopenia T2DM Hx of AAA HLD Chronic osteomyelitis of lumbar and sacral regions  Past Surgical History Past Surgical History:  Procedure Laterality Date   A/V FISTULAGRAM Left 06/04/2019   Procedure: A/V FISTULAGRAM;  Surgeon: Eliza Lonni RAMAN, MD;  Location: [facility] INVASIVE CV LAB;  Service: Cardiovascular;  Laterality: Left;   A/V FISTULAGRAM Left 10/15/2019   Procedure: A/V FISTULAGRAM;  Surgeon: Eliza Lonni RAMAN, MD;  Location: [facility] INVASIVE CV LAB;  Service: Cardiovascular;  Laterality: Left;   A/V FISTULAGRAM N/A 01/24/2021   Procedure: A/V FISTULAGRAM - Left Upper;  Surgeon: Magda Debby SAILOR, MD;  Location: [facility] INVASIVE CV LAB;  Service: Cardiovascular;  Laterality: N/A;   A/V SHUNT INTERVENTION Left 04/15/2024   Procedure: A/V SHUNT INTERVENTION;  Surgeon: Serene Gaile ORN, MD;  Location: [facility] PV LAB;  Service: Cardiovascular;  Laterality: Left;   AV FISTULA PLACEMENT Left 07/02/2018   Procedure: BRACHIOCEPHALIC ARTERIOVENOUS (AV) FISTULA CREATION  LEFT ARM;  Surgeon: Serene Gaile ORN, MD;  Location: [facility] OR;  Service: Vascular;  Laterality: Left;   BASCILIC VEIN TRANSPOSITION Left 07/26/2019   Procedure: Bascilic Vein Transposition;  Surgeon: Eliza Lonni RAMAN, MD;  Location: [facility] OR;  Service: Vascular;  Laterality: Left;   COLONOSCOPY     EMBOLIZATION (CATH LAB) Left 06/04/2019   Procedure:  EMBOLIZATION;  Surgeon: Eliza Lonni RAMAN, MD;  Location: [facility] INVASIVE CV LAB;  Service: Cardiovascular;  Laterality: Left;  LT ARM FISTULA/COMPETING BRANCH   EYE SURGERY Bilateral    cataracts x2   EYE SURGERY     INSERTION OF DIALYSIS CATHETER Right 06/08/2019   Procedure: INSERTION OF PALINDROME DIALYSIS CATHETER IN RIGHT INTERNAL JUGULAR;  Surgeon: Eliza Lonni RAMAN, MD;  Location: [facility] OR;  Service: Vascular;  Laterality: Right;   LIGATION OF ARTERIOVENOUS  FISTULA Left 07/26/2019   Procedure: Ligation Of BrachioCephalic  Fistula;  Surgeon: Eliza Lonni RAMAN, MD;  Location: [facility] OR;  Service: Vascular;  Laterality: Left;   NECK SURGERY     PERIPHERAL VASCULAR BALLOON ANGIOPLASTY Left 06/04/2019   Procedure: PERIPHERAL VASCULAR BALLOON ANGIOPLASTY;  Surgeon: Eliza Lonni RAMAN, MD;  Location: [facility] INVASIVE CV LAB;  Service: Cardiovascular;  Laterality: Left;  ARM FISTULA   PERIPHERAL VASCULAR BALLOON ANGIOPLASTY Left 10/15/2019   Procedure: PERIPHERAL VASCULAR BALLOON ANGIOPLASTY;  Surgeon: Eliza Lonni RAMAN, MD;  Location: [facility] INVASIVE CV LAB;  Service: Cardiovascular;  Laterality: Left;  arm fistula   TEE WITHOUT CARDIOVERSION N/A 01/19/2021   Procedure: TRANSESOPHAGEAL ECHOCARDIOGRAM (TEE);  Surgeon: Jeffrie Oneil BROCKS, MD;  Location: [facility] ENDOSCOPY;  Service: Cardiovascular;  Laterality: N/A;   VENOUS ANGIOPLASTY  04/15/2024   Procedure: VENOUS ANGIOPLASTY;  Surgeon: Serene Gaile ORN, MD;  Location: [facility] PV LAB;  Service: Cardiovascular;;  Subclavian Vein     Social:  Lives With: Occupation: Support: Level of Function: PCP: Adella Norris, MD  Substances: -Tobacco: denies -Alcohol: denies -Recreational Drug: denies  Family History:  Family History  Problem Relation Age of Onset   Heart disease Mother        cause of death--CHF?   Diabetes Father    Kidney disease Father        Dialysis for kidney failure   Diabetes Sister    Diabetes Brother    Colon cancer  Neg Hx    Stomach cancer Neg Hx    Esophageal cancer Neg Hx      Allergies: Allergies as of 04/24/2024   (No Known Allergies)    Review of Systems: A complete ROS was negative except as per HPI.   OBJECTIVE:   Physical Exam: Blood pressure 125/68, pulse 87, temperature 97.7 F (36.5 C), resp. rate 11, height 5' 2 (1.575 m), weight 68 kg, SpO2 100%.  Constitutional: well-appearing male, in no acute distress HENT: normocephalic atraumatic, mucous membranes moist Eyes: conjunctiva non-erythematous Neck: supple Cardiovascular: tachycardic with no accessory heart sounds Pulmonary/Chest: normal work of breathing on room air, lungs clear to auscultation bilaterally Abdominal: soft, non-tender, non-distended MSK: normal bulk and tone Skin: warm and dry  Labs: CBC    Component Value Date/Time   WBC 6.4 04/24/2024 0803   RBC 3.90 (L) 04/24/2024 0803   HGB 12.5 (L) 04/24/2024 0803   HGB 10.7 (L) 09/30/2023 0921   HCT 40.3 04/24/2024 0803   HCT 31.7 (L) 09/30/2023 0921   PLT 101 (L) 04/24/2024 0803   PLT 82 (LL) 09/30/2023 0921   MCV 103.3 (H) 04/24/2024 0803   MCV 95 09/30/2023 9078  MCH 32.1 04/24/2024 0803   MCHC 31.0 04/24/2024 0803   RDW 15.3 04/24/2024 0803   RDW 13.1 09/30/2023 0921   LYMPHSABS 1.3 04/24/2024 0803   LYMPHSABS 1.1 09/30/2023 0921   MONOABS 0.7 04/24/2024 0803   EOSABS 0.0 04/24/2024 0803   EOSABS 0.0 09/30/2023 0921   BASOSABS 0.1 04/24/2024 0803   BASOSABS 0.1 09/30/2023 0921     CMP     Component Value Date/Time   NA 136 04/24/2024 0803   NA 137 09/30/2023 0921   K 6.8 (HH) 04/24/2024 0803   CL 97 (L) 04/24/2024 0803   CO2 24 04/24/2024 0803   GLUCOSE 124 (H) 04/24/2024 0803   BUN 42 (H) 04/24/2024 0803   BUN 84 (HH) 09/30/2023 0921   CREATININE 8.03 (H) 04/24/2024 0803   CREATININE 0.95 12/06/2013 1634   CALCIUM  9.5 04/24/2024 0803   CALCIUM  8.1 (L) 04/09/2018 1447   PROT 6.8 03/10/2024 0323   PROT 7.3 09/30/2023 0921   ALBUMIN   3.5 03/12/2024 0855   ALBUMIN  4.5 09/30/2023 0921   AST 15 03/10/2024 0323   ALT 19 03/10/2024 0323   ALKPHOS 105 03/10/2024 0323   BILITOT 1.0 03/10/2024 0323   BILITOT 0.5 09/30/2023 0921   GFRNONAA 7 (L) 04/24/2024 0803   GFRNONAA 87 12/06/2013 1634   GFRAA 8 (L) 06/09/2019 0517   GFRAA >89 12/06/2013 1634    Imaging:  DG Chest Port 1 View Result Date: 04/24/2024 CLINICAL DATA:  68 year old male with palpitation, hypertensive at dialysis center this morning and no dialysis was performed. EXAM: PORTABLE CHEST 1 VIEW COMPARISON:  Portable chest 03/16/2024 and earlier. FINDINGS: Portable AP semi upright view at 0714 hours. Stable cardiomegaly and mediastinal contours. Visualized tracheal air column is within normal limits. Lung volumes normal, mildly improved. Allowing for portable technique the lungs are clear. No pneumothorax or pleural effusion. Stable visualized osseous structures. Cervical ACDF hardware. Left upper extremity/axillary vascular stent partially visible. Paucity bowel gas. IMPRESSION: Stable cardiomegaly. No acute cardiopulmonary abnormality. Electronically Signed   By: VEAR Hurst M.D.   On: 04/24/2024 07:37     EKG: personally reviewed my interpretation is Atrial fibrillation with RVR.  ASSESSMENT & PLAN:   Assessment & Plan by Problem: Principal Problem:   Atrial tachycardia (HCC) Active Problems:   Hypothyroidism   Hyperkalemia   ESRD on dialysis (HCC)   Anemia   Thrombocytopenia (HCC)   Chronic osteomyelitis of lumbar spine (HCC)   Ryan Wells is a 69 y.o. person living with a history of hypertension, T2DM, ESRD on dialysis, CAD, chronic lumbar discitis/osteomyelitis who presented from dialysis after being told that he had a rapid heart rate was unable to to undergo the session today and admitted for atrial tachycardia on hospital day 0  Atrial tachycardia Paroxysmal A-fib with RVR -The patient was sent from his dialysis unit due to a rapid heart  rate. Upon presentation to the ER he was in atrial tachycardia with a rate of 151. -Upon examination the patient was asymptomatic and denied chest pain, shortness of breath, palpitations, or presyncope. -It has been documented the past that he does become symptomatic at home and has lost consciousness at times.  However today he denies all symptoms -He was recently discharged from the hospital on 8/8 after being admitted for tachyarrhythmia and intermittent bradycardia with sinus pauses.  During that admission was evaluated by electrophysiology who defer pacemaker placement and recommended amiodarone  at discharge. -The patient states that he did take his amiodarone  for a  while but has not been taking it recently. -Cardiology consulted, placed patient on amiodarone  drip and recommended holding off on AV nodal agents as the patient has had poor tolerance of these in the past including bradycardia and nonresponsiveness. -Patient intermittently tachycardic since arrival however remains asymptomatic.  Will continue amiodarone  at this time. -Patient noncompliant to Eliquis  at home due to cost. Will touch base with pharmacy on Monday about cost assistance.   ESRD on HD Electrolyte derangement -The patient adheres to MWF schedule with last session on Wednesday of this week. -Potassium was 6.8 upon arrival, suspect this is due to missed dialysis session yesterday.  He is frequently hyperkalemic which does seem to be associated with these tachyarrhythmias that he experiences; however they do frequently improve after dialysis -Nephrology consulted, appreciate their recommendations. -Plan for dialysis today.  Hypothyroidism -Patient does have a long history of hypothyroidism. TSH today significantly elevated at 33. -Suspect this may be an increase from baseline due to amiodarone  therapy. -Will continue levothyroxine  at 88 mcg/day.  Anemia of chronic disease -Hemoglobin 12.5 on arrival which is elevated  from baseline 8-9. -Anemia likely secondary to chronic kidney disease. -No concern at this time for acute bleeding. -Will trend daily CBCs.  Chronic osteomyelitis -The patient does have a history of chronic osteomyelitis of the L5-S1 region with associated discitis.  He was hospitalized for this in 2022 and underwent a 6-week treatment course for MSSA bacteremia.  However chronic osteomyelitis with progressive changes have been noted on CTs and MRIs in the years following. -Most recent MRI in July 2025 shows chronic L5-S1 discitis/osteomyelitis with disc edema improved since 2022 but progressive endplate erosive damage.  Equivocal trace paraspinal edema without abscess. -Patient without leukocytosis and afebrile upon arrival. -Low suspicion at this time for systemic infection  Best practice: Diet: Normal VTE: DOAC IVF: None,  Code: Full  Disposition planning: Prior to Admission Living Arrangement: Home, living with family Anticipated Discharge Location: Home  Dispo: Admit patient to Observation with expected length of stay less than 2 midnights.  Signed: Myrna Bitters, DO Internal Medicine Resident  04/24/2024, 5:36 PM  On Call pager: [phone]

## 2024-04-24 NOTE — Hospital Course (Signed)
 Principal Problem:   Atrial tachycardia (HCC) Active Problems:   Hypothyroidism   Hyperkalemia   ESRD on dialysis (HCC)   Anemia   Thrombocytopenia (HCC)   Chronic osteomyelitis of lumbar spine (HCC)  Resolved Problems:   * No resolved hospital problems. *  Consults:***  Procedures:***  Follow-up items:***

## 2024-04-24 NOTE — ED Triage Notes (Signed)
 Pt BIB [facility] from dialysis center. Per staff at dialysis center, pt did not receive any dialysis treatment due to being hypertensive. On EMS arrival pt was no longer hypertensive, but was tachycardic in the 160s. Pt denies Cp/SOB.

## 2024-04-25 ENCOUNTER — Other Ambulatory Visit (HOSPITAL_COMMUNITY): Payer: Self-pay

## 2024-04-25 DIAGNOSIS — I120 Hypertensive chronic kidney disease with stage 5 chronic kidney disease or end stage renal disease: Secondary | ICD-10-CM | POA: Diagnosis not present

## 2024-04-25 DIAGNOSIS — I480 Paroxysmal atrial fibrillation: Secondary | ICD-10-CM | POA: Diagnosis not present

## 2024-04-25 DIAGNOSIS — M4626 Osteomyelitis of vertebra, lumbar region: Secondary | ICD-10-CM | POA: Diagnosis not present

## 2024-04-25 DIAGNOSIS — N186 End stage renal disease: Secondary | ICD-10-CM | POA: Diagnosis not present

## 2024-04-25 DIAGNOSIS — E039 Hypothyroidism, unspecified: Secondary | ICD-10-CM | POA: Diagnosis not present

## 2024-04-25 DIAGNOSIS — E875 Hyperkalemia: Secondary | ICD-10-CM | POA: Diagnosis not present

## 2024-04-25 DIAGNOSIS — I4719 Other supraventricular tachycardia: Secondary | ICD-10-CM | POA: Diagnosis not present

## 2024-04-25 DIAGNOSIS — Z992 Dependence on renal dialysis: Secondary | ICD-10-CM

## 2024-04-25 LAB — BASIC METABOLIC PANEL WITH GFR
Anion gap: 13 (ref 5–15)
BUN: 26 mg/dL — ABNORMAL HIGH (ref 8–23)
CO2: 28 mmol/L (ref 22–32)
Calcium: 9.1 mg/dL (ref 8.9–10.3)
Chloride: 95 mmol/L — ABNORMAL LOW (ref 98–111)
Creatinine, Ser: 5.51 mg/dL — ABNORMAL HIGH (ref 0.61–1.24)
GFR, Estimated: 11 mL/min — ABNORMAL LOW (ref >60)
Glucose, Bld: 127 mg/dL — ABNORMAL HIGH (ref 70–99)
Potassium: 4.6 mmol/L (ref 3.5–5.1)
Sodium: 136 mmol/L (ref 135–145)

## 2024-04-25 LAB — C-REACTIVE PROTEIN: CRP: 0.6 mg/dL (ref <1.0)

## 2024-04-25 LAB — SEDIMENTATION RATE: Sed Rate: 15 mm/h (ref 0–16)

## 2024-04-25 LAB — CBC
HCT: 34.1 % — ABNORMAL LOW (ref 39.0–52.0)
Hemoglobin: 11 g/dL — ABNORMAL LOW (ref 13.0–17.0)
MCH: 32 pg (ref 26.0–34.0)
MCHC: 32.3 g/dL (ref 30.0–36.0)
MCV: 99.1 fL (ref 80.0–100.0)
Platelets: 92 K/uL — ABNORMAL LOW (ref 150–400)
RBC: 3.44 MIL/uL — ABNORMAL LOW (ref 4.22–5.81)
RDW: 15.1 % (ref 11.5–15.5)
WBC: 5.9 K/uL (ref 4.0–10.5)
nRBC: 0 % (ref 0.0–0.2)

## 2024-04-25 MED ORDER — AMIODARONE HCL 200 MG PO TABS
400.0000 mg | ORAL_TABLET | Freq: Two times a day (BID) | ORAL | Status: DC
  Administered 2024-04-25: 400 mg via ORAL
  Filled 2024-04-25: qty 2

## 2024-04-25 MED ORDER — AMIODARONE HCL 200 MG PO TABS
ORAL_TABLET | ORAL | 2 refills | Status: DC
  Filled 2024-04-25: qty 90, 62d supply, fill #0

## 2024-04-25 NOTE — Progress Notes (Signed)
 [facility] Progress Note   Subjective:   Patient seen and examined at bedside with tele-interpreter.  Feeling well.  Denies chest pain, shortness of breath, abdominal pain and n/v/d.  Reports being hungry.  Says he has not eaten all day, they said I couldn't.  Will discuss with nurse and try to get him lunch.  Discussed importance of low K diet.  Offered additional education but reports he has all the handouts at home.  States his family is very strict about what he eats because of what the handouts advise.  He do not let him eat a lot of meats because they are on the handout as well.      Objective Vitals:   04/25/24 0400 04/25/24 0740 04/25/24 1100 04/25/24 1142  BP:  111/64  112/74  Pulse: 78 77 77 77  Resp:  16    Temp:  98 F (36.7 C)  97.9 F (36.6 C)  TempSrc:  Oral  Oral  SpO2: 100% 97% 98% 99%  Weight:      Height:       Physical Exam General:well appearing male in NAD Heart:RRR, no mrg Lungs:CTAB, nml WOB on RA Abdomen:soft, NTND Extremities:no LE edema Dialysis Access: LU AVF +b/t   Filed Weights   04/24/24 0657 04/24/24 1355 04/25/24 0354  Weight: 68 kg 68 kg 68.8 kg    Intake/Output Summary (Last 24 hours) at 04/25/2024 1219 Last data filed at 04/25/2024 0405 Gross per 24 hour  Intake 380.52 ml  Output 1000 ml  Net -619.48 ml    Additional Objective Labs: Basic Metabolic Panel: Recent Labs  Lab 04/24/24 0803 04/24/24 1915 04/25/24 0343  NA 136  --  136  K 6.8*  --  4.6  CL 97*  --  95*  CO2 24  --  28  GLUCOSE 124*  --  127*  BUN 42*  --  26*  CREATININE 8.03*  --  5.51*  CALCIUM  9.5  --  9.1  PHOS  --  3.1  --     CBC: Recent Labs  Lab 04/24/24 0803 04/25/24 0343  WBC 6.4 5.9  NEUTROABS 4.4  --   HGB 12.5* 11.0*  HCT 40.3 34.1*  MCV 103.3* 99.1  PLT 101* 92*   CBG: Recent Labs  Lab 04/24/24 1056 04/24/24 1101  GLUCAP 103* 111*    Studies/Results: DG Chest Port 1 View Result Date: 04/24/2024 CLINICAL DATA:   68 year old male with palpitation, hypertensive at dialysis center this morning and no dialysis was performed. EXAM: PORTABLE CHEST 1 VIEW COMPARISON:  Portable chest 03/16/2024 and earlier. FINDINGS: Portable AP semi upright view at 0714 hours. Stable cardiomegaly and mediastinal contours. Visualized tracheal air column is within normal limits. Lung volumes normal, mildly improved. Allowing for portable technique the lungs are clear. No pneumothorax or pleural effusion. Stable visualized osseous structures. Cervical ACDF hardware. Left upper extremity/axillary vascular stent partially visible. Paucity bowel gas. IMPRESSION: Stable cardiomegaly. No acute cardiopulmonary abnormality. Electronically Signed   By: VEAR Hurst M.D.   On: 04/24/2024 07:37    Medications:   amiodarone   400 mg Oral BID   apixaban   5 mg Oral BID   atorvastatin   20 mg Oral QHS   Chlorhexidine  Gluconate Cloth  6 each Topical Q0600   levothyroxine   88 mcg Oral Q0600    Dialysis Orders: TTS - [place] 3.75hrs 400/AF 1.5 2k 2Ca LU AVF No heparin  Calcitriol  1.75mcg qHD   Assessment/Plan: 1. Atrial tachycardia w/PAC and Hx Flutter -  reload amiodarone , Eliquis  restarted. 2. Chronic osteomyelitis - progression of discitis noted on July MRI.  Plan for repeat 6 week course of Ancef  w/HD. Will need MRI and end of antibiotic course.  3. Hyperkalemia - K 6.8 on admit. Treated medically + HD.  Now in goal. On lokelma  as outpatient. Reinforced low K diet.  4. ESRD - on HD TTS.  Next HD 04/27/24 5. Anemia of CKD- Hgb 11. No indication for ESA. No iron  therapy with ABX. 6. Secondary hyperparathyroidism - Calcium  and phos in goal Continue home meds. 7. HTN/volume - Blood pressure in goal. Does not appear volume overloaded. UF as tolerated. 8. Nutrition - Renal diet w/fluid restrictions. Will ask outpatient dietitian to review diet with family again to make sure he is getting adequate nutrition.   Manuelita Labella, PA-C [facility] 04/25/2024,12:19 PM  LOS: 0 days

## 2024-04-25 NOTE — Care Management Obs Status (Signed)
 MEDICARE OBSERVATION STATUS NOTIFICATION   Patient Details  Name: Ryan Wells MRN: [id number] Date of Birth: [birth year]   Medicare Observation Status Notification Given:  Yes    Jeremaine Maraj G., RN 04/25/2024, 9:04 AM

## 2024-04-25 NOTE — Plan of Care (Signed)

## 2024-04-25 NOTE — Plan of Care (Signed)

## 2024-04-25 NOTE — Discharge Instructions (Addendum)
 Gracias por permitirnos ser parte de su atencin. Fue hospitalizado por taquicardia. Lo tratamos con amiodarona.  Vea los cambios en sus medicamentos y el manejo de su enfermedad crnica a continuacin:  *Para su frecuencia cardaca - Hemos comenzado a administrarle los siguientes medicamentos: - Amiodarona: - Tome 400 mg (2 comprimidos) cada 12 horas durante UNA SEMANA - Tome 200 mg (1 comprimido) cada 12 horas durante UNA SEMANA - Tome 200 mg (1 comprimido) una vez al [employer], todos los das de ahora en adelante. Asegrese de tomar este medicamento todos los das, ya que mantiene su frecuencia y ritmo Statistician. - Eliquis : - Este es su anticoagulante: tome 1 comprimido cada 12 horas.  *Para su infeccin sea - Ha recibido tratamiento previo para una infeccin en los huesos de la columna vertebral. Hay signos en su resonancia magntica que an muestran una infeccin. Le hemos recetado antibiticos durante 6 semanas con dilisis.  *Para su enfermedad renal - Contine su dilisis segn lo programado todos los lunes, mircoles y viernes.  - Consulte a su mdico de cabecera en 7 a 10 das.  CITA DE SEGUIMIENTO: Llame para programar una cita de seguimiento con su mdico de cabecera al [phone].  Consulte a su cardilogo (mdico del corazn) en 7 a 14 das.  Asegrese de tomar su medicamento para el corazn (amiodarona) todos [place]olo.  Llame a su mdico de cabecera o a nuestra clnica si tiene alguna pregunta o inquietud. Podemos ayudarle y evitar una larga y costosa espera en la sala de emergencias. El nmero de telfono de latvia y fuera del horario de atencin es [phone]. El mejor horario para llamar es de lunes a viernes, de 9:00 a. m. a 4:00 p. m., pero siempre hay alguien disponible las 24 horas, los 7 [address]  [address] de la [place], en caso de Associate Professor. Si necesita resurtidos de [employer], notifique a su farmacia con una semana de anticipacin y nos enviarn una  solicitud.  Nos alegra que se sienta mejor.  Cleaven Demario [facility] en [place]

## 2024-04-25 NOTE — Progress Notes (Signed)
 PHARMACY CONSULT NOTE FOR:  OUTPATIENT  PARENTERAL ANTIBIOTIC THERAPY (OPAT)  Indication: chronic osteomyelitis  Regimen: Cefazolin  2g IV with each HD session End date: 06/06/24 (6 weeks duration) ID Provider: Reyes Fenton, MD  Because these IV antibiotics will be given with HD sessions, medication orders not placed for IV antibiotics. HD Center should follow up based on discharge information; orders not required.    Thank you for allowing pharmacy to be a part of this patient's care.  Maurilio Patten, PharmD PGY1 Pharmacy Resident [facility] 04/25/2024 11:10 AM

## 2024-04-25 NOTE — Discharge Planning (Signed)
 [facility] Patient Discharge Orders- [facility] CLINIC: [place]  Patient's name: Ryan Wells Admit/DC Dates: 04/24/2024 - 04/25/24  Discharge Diagnoses: Atrial tachycardia vs atypical A flutter    Hyperkalemia Chronic osteomyelitis progression - 6 week course antibiotics. Needs MRI after for follow up. Cefazolin  2g IV qHD until 06/06/24  HD ORDER CHANGES: Heparin  change: no EDW Change: no Bath Change: no       ANEMIA MANAGEMENT: Aranesp : Given: no    PRBC's Given: no  ESA dose for discharge: none IV Iron  dose at discharge: no IV iron  while on antibiotics   BONE/MINERAL MEDICATIONS: Hectorol /Calcitriol  change: no Sensipar/Parsabiv change: no   ACCESS INTERVENTION/CHANGE: no Details:   RECENT LABS: Recent Labs  Lab 04/24/24 1915 04/25/24 0343  K  --  4.6  CALCIUM   --  9.1  PHOS 3.1  --    Recent Labs  Lab 04/25/24 0343  HGB 11.0*   Blood Culture    Component Value Date/Time   SDES ABSCESS 03/19/2021 1709   SPECREQUEST LEFT PSOAS 03/19/2021 1709   CULT  03/19/2021 1709    FEW STAPHYLOCOCCUS AUREUS NO ANAEROBES ISOLATED Performed at [facility], [address], [place], [place] [address]    REPTSTATUS 03/24/2021 FINAL 03/19/2021 1709       IV ANTIBIOTICS: YES **Details: Cefazolin  2g IV q HD until 06/06/24   OTHER ANTICOAGULATION:  On Eliquis : yes    OTHER/APPTS/LAB ORDERS: Please have dietitian talk with patient family about diet.  States they are very overprotective and strictly following handouts they were given.  He stated they will only let him eat meat a couple times a week because it is listed on a handout as a food to avoid.  Discussed importance protein in his diet.     D/C Meds to be reconciled by nurse after every discharge.  Completed By: Manuelita Labella PA-C   Reviewed by: MD:______ RN_______

## 2024-04-25 NOTE — Discharge Summary (Signed)
 Name: Ryan Wells MRN: [id number] DOB: 05/08/1956 68 y.o. PCP: Ryan Norris, MD  Date of Admission: 04/24/2024  6:48 AM Date of Discharge: 04/25/2024 Attending Physician: Dr. Reyes Fenton  Discharge Diagnosis: 1. Principal Problem:   Atrial tachycardia (HCC) Active Problems:   Hypothyroidism   Hyperkalemia   End stage renal disease on dialysis (HCC)   Anemia   Thrombocytopenia (HCC)   Chronic osteomyelitis of lumbar spine (HCC)   Osteomyelitis of lumbar spine [facility])   Discharge Medications: Allergies as of 04/25/2024   No Known Allergies      Medication List     TAKE these medications    acetaminophen  325 MG tablet Commonly known as: TYLENOL  Take 2 tablets (650 mg total) by mouth every 6 (six) hours as needed for mild pain (or Fever >/= 101).   amiodarone  200 MG tablet Commonly known as: Pacerone  Take 2 tablets (400 mg total) by mouth 2 (two) times daily for 7 days, THEN 1 tablet (200 mg total) 2 (two) times daily for 7 days, THEN 1 tablet (200 mg total) daily. Tome 2 tabletas (400 mg en total) por va oral 2 (dos) veces al da durante 7 das, LUEGO 1 tableta (200 mg en total) 2 (dos) veces al da durante 7 das, LUEGO 1 tableta (200 mg en total) al [employer]. Start taking on: April 25, 2024 What changed: See the new instructions.   atorvastatin  20 MG tablet Commonly known as: LIPITOR TAKE 1 TABLET BY MOUTH DAILY WITH THE EVENING MEAL What changed: See the new instructions.   Dialyvite 800 0.8 MG Tabs Take 1 tablet by mouth at bedtime.   Eliquis  5 MG Tabs tablet Generic drug: apixaban  Take 1 tablet (5 mg total) by mouth 2 (two) times daily.   levothyroxine  88 MCG tablet Commonly known as: SYNTHROID  TAKE 1 TABLET(88 MCG) BY MOUTH DAILY What changed: See the new instructions.   Lokelma  5 g packet Generic drug: sodium zirconium cyclosilicate  Take 5 g by mouth daily.   sevelamer  carbonate 800 MG tablet Commonly known as: RENVELA  Take 1,600  mg by mouth with breakfast, with lunch, and with evening meal.        Disposition and follow-up:   Mr.Ryan Wells was discharged from [facility] in Stable condition.  At the hospital follow up visit please address:  A-fib with RVR--pt was discharged with amiodarone  load and taper. Ensure medication compliance and that the pt understands the importance of remaining adherent. Also assess for symptoms. He was asymptomatic during admission, but has been symptomatic in the past.  Avoid AV nodal agents per cardiology while inpatient.  Hypothyroidism--TSH 33 while admitte Hyperkalemia--pt hyperkalemic with K 6.8 on arrival. Improved with dialysis. Ensure he is taking his lokelma  daily. He is often tachycardic when K spikes.  Chronic Osteomyelitis--Pt has hx of osteomyelitis noted on multiple CT scans. He was treated in 2022. Per ID while inpatient, pt started on 6 weeks of ancef  with dialysis. ID f/u scheduled with Dr. Fenton.   2.  Labs / imaging needed at time of follow-up: [facility]   Hospital Course by problem list: Ryan Wells is a 68 y.o. person living with a history of hypertension, type 2 diabetes, ESRD on HD, coronary artery disease, chronic lumbar discitis/osteomyelitis who presented from dialysis after being told his heart rate was rapid and admitted for atrial tachycardia now being discharged on hospital day 0 with the following pertinent hospital course:  Atrial tachycardia Paroxysmal A-fib with RVR The patient  was sent to the ER from his dialysis unit after being told that he had a rapid heart rate and was not able to undergo dialysis.  Upon presentation he did have a heart rate of 151 with an EKG showing atrial tachycardia.  At this time he was evaluated by cardiology who did place him on an amiodarone  drip at 60 mg/h with titration down to 30 mg/h. He was recently discharged from [facility] on 8/8 after being treated for similar presentation with  symptomatic tachycardia at home.  He did convert to normal sinus rhythm however was in and out of A-fib with RVR throughout the afternoon of 9/13.  He did remain asymptomatic so he was continued on the amiodarone  drip with eventual conversion to normal sinus rhythm.  On 9/14 he was evaluated by the medicine team and stated that he was feeling much better he continued to deny chest pain and shortness of breath and syncopal symptoms.  He was transition to oral amiodarone  at this time with a load of 400 mg twice daily for 1 week followed by 200 mg twice daily for 1 week followed by 200 mg daily moving forward.  At that time he was deemed stable for discharge.  ESRD on HD Electrolyte derangement The patient is adherent with outpatient management defer decision however he was not unable to attend dialysis on Friday due to this tachyarrhythmia.  Potassium upon arrival was 6.8.  He was given insulin , calcium  gluconate, and dextrose  in the ER.  On the evening of 9/13 he did attend dialysis with 1 L UF.  On 9/14 his electrolytes had stabilized with a potassium of 4.6.  Suspect his hyperkalemia was contributing significantly to his tachyarrhythmia based on the story line and previous presentations in the past.  Chronic osteomyelitis The patient is a history of cardiovascular labs and L5-S1 region with associated discitis.  He was treated in 2022 with a 6-day course of Ancef  for MSSA bacteremia and associated osteomyelitis.  His CRP and ESR were not elevated here however he does have an MRI in July showing progressive changes with endplate erosive damage.  Per infectious disease input he was placed on a 6-week course of Ancef  to be administered with dialysis.  Stable chronic medical conditions: CAD Hypertension  Subjective The patient was seen and evaluated at the bedside this morning.  He reports that he feels much better and remains asymptomatic without chest pain, shortness of breath, or pre-syncopal symptoms.   We did discuss discharge today which he is agreeable and excited for.  All questions and concerns were addressed at this time.  Discharge Exam:   BP 111/64 (BP Location: Right Arm)   Pulse 77   Temp 98 F (36.7 C) (Oral)   Resp 16   Ht 5' 2 (1.575 m)   Wt 68.8 kg   SpO2 97%   BMI 27.74 kg/m  Discharge exam:  Const: Awake, alert in NAD HENT: Normocephalic, atraumatic, mucus membranes moist Card: RRR, No MRG, No pitting edema on LE's bilaterally  Resp: LCTAB, no increased work of breathing Abd: Soft, NTND Extremities: Warm, pink  Pertinent Labs, Studies, and Procedures:     Latest Ref Rng & Units 04/25/2024    3:43 AM 04/24/2024    8:03 AM 03/19/2024    2:54 AM  CBC  WBC 4.0 - 10.5 K/uL 5.9  6.4  5.8   Hemoglobin 13.0 - 17.0 g/dL 88.9  87.4  8.6   Hematocrit 39.0 - 52.0 % 34.1  40.3  26.1   Platelets 150 - 400 K/uL 92  101  149        Latest Ref Rng & Units 04/25/2024    3:43 AM 04/24/2024    8:03 AM 03/19/2024    2:54 AM  CMP  Glucose 70 - 99 mg/dL 872  875  865   BUN 8 - 23 mg/dL 26  42  38   Creatinine 0.61 - 1.24 mg/dL 4.48  1.96  4.59   Sodium 135 - 145 mmol/L 136  136  135   Potassium 3.5 - 5.1 mmol/L 4.6  6.8  4.2   Chloride 98 - 111 mmol/L 95  97  93   CO2 22 - 32 mmol/L 28  24  30    Calcium  8.9 - 10.3 mg/dL 9.1  9.5  8.9     DG Chest Port 1 View Result Date: 04/24/2024 CLINICAL DATA:  68 year old male with palpitation, hypertensive at dialysis center this morning and no dialysis was performed. EXAM: PORTABLE CHEST 1 VIEW COMPARISON:  Portable chest 03/16/2024 and earlier. FINDINGS: Portable AP semi upright view at 0714 hours. Stable cardiomegaly and mediastinal contours. Visualized tracheal air column is within normal limits. Lung volumes normal, mildly improved. Allowing for portable technique the lungs are clear. No pneumothorax or pleural effusion. Stable visualized osseous structures. Cervical ACDF hardware. Left upper extremity/axillary vascular stent  partially visible. Paucity bowel gas. IMPRESSION: Stable cardiomegaly. No acute cardiopulmonary abnormality. Electronically Signed   By: VEAR Hurst M.D.   On: 04/24/2024 07:37     Discharge Instructions: Discharge Instructions     Call MD for:  difficulty breathing, headache or visual disturbances   Complete by: As directed    Call MD for:  persistant dizziness or light-headedness   Complete by: As directed    Call MD for:  persistant nausea and vomiting   Complete by: As directed    Call MD for:  temperature >100.4   Complete by: As directed    Diet - low sodium heart healthy   Complete by: As directed    Discharge instructions   Complete by: As directed    Gracias por permitirnos ser parte de su atencin. Fue hospitalizado por taquicardia. Lo tratamos con amiodarona.  Vea los cambios en sus medicamentos y el manejo de su enfermedad crnica a continuacin:  *Para su frecuencia cardaca - Hemos comenzado a administrarle los siguientes medicamentos: - Amiodarona: - Tome 400 mg (2 comprimidos) cada 12 horas durante UNA SEMANA - Tome 200 mg (1 comprimido) cada 12 horas durante UNA SEMANA - Tome 200 mg (1 comprimido) una vez al [employer], todos los das de ahora en adelante. Asegrese de tomar este medicamento todos los das, ya que mantiene su frecuencia y ritmo Statistician. - Eliquis : - Este es su anticoagulante: tome 1 comprimido cada 12 horas.  *Para su infeccin sea - Ha recibido tratamiento previo para una infeccin en los huesos de la columna vertebral. Hay signos en su resonancia magntica que an muestran una infeccin. Le hemos recetado antibiticos durante 6 semanas con dilisis. *Para su enfermedad renal - Contine su dilisis segn lo programado todos los lunes, mircoles y viernes.  - Consulte a su mdico de cabecera en 7 a 10 das.  CITA DE SEGUIMIENTO: Llame para programar una cita de seguimiento con su mdico de cabecera al [phone]. Consulte a su cardilogo  (mdico del corazn) en 7 a 14 das.  Asegrese de tomar su medicamento para el corazn (amiodarona) todos los  das.  Llame a su mdico de cabecera o a nuestra clnica si tiene alguna pregunta o inquietud. Podemos ayudarle y evitar una larga y costosa espera en la sala de emergencias. El nmero de telfono de latvia y fuera del horario de atencin es [phone]. El mejor horario para llamar es de lunes a viernes, de 9:00 a. m. a 4:00 p. m., pero siempre hay alguien disponible las 24 horas, los 7 [address]  [address] de la [place], en caso de Associate Professor. Si necesita resurtidos de [employer], notifique a su farmacia con una semana de anticipacin y nos enviarn una solicitud.  Nos alegra que se sienta mejor.  Khush [facility] [place]   Increase activity slowly   Complete by: As directed        Signed: Myrna Bitters, DO 04/25/2024, 11:00 AM

## 2024-04-25 NOTE — Progress Notes (Signed)
 All discharge instructions reviewed with pt.in [place]. Pt's son picked up pt.and all instructions again reviewed with son (speaks [place]). All medications reviewed. All questions and concerns addressed.   Pt's belongings verified and taken home with pt.

## 2024-04-27 ENCOUNTER — Other Ambulatory Visit

## 2024-04-27 LAB — HEPATITIS B SURFACE ANTIBODY, QUANTITATIVE: Hep B S AB Quant (Post): 39.2 m[IU]/mL

## 2024-04-30 ENCOUNTER — Other Ambulatory Visit (INDEPENDENT_AMBULATORY_CARE_PROVIDER_SITE_OTHER)

## 2024-04-30 DIAGNOSIS — I5032 Chronic diastolic (congestive) heart failure: Secondary | ICD-10-CM

## 2024-04-30 DIAGNOSIS — R55 Syncope and collapse: Secondary | ICD-10-CM

## 2024-04-30 NOTE — Addendum Note (Signed)
 Addended by: DASIE VIKI SAILOR on: 04/30/2024 08:31 AM   Modules accepted: Orders

## 2024-05-01 LAB — TSH: TSH: 29.3 u[IU]/mL — ABNORMAL HIGH (ref 0.450–4.500)

## 2024-05-03 ENCOUNTER — Ambulatory Visit: Payer: Self-pay | Admitting: Internal Medicine

## 2024-05-03 DIAGNOSIS — E039 Hypothyroidism, unspecified: Secondary | ICD-10-CM

## 2024-05-04 NOTE — Addendum Note (Signed)
 Encounter addended by: Malvina Pina A on: 05/04/2024 9:32 AM  Actions taken: Imaging Exam ended

## 2024-05-04 NOTE — Telephone Encounter (Signed)
 Free THS was added.

## 2024-05-05 LAB — SPECIMEN STATUS REPORT

## 2024-05-05 LAB — T4, FREE: Free T4: 1.34 ng/dL (ref 0.82–1.77)

## 2024-05-06 ENCOUNTER — Telehealth: Payer: Self-pay | Admitting: Internal Medicine

## 2024-05-06 NOTE — Telephone Encounter (Signed)
 Nurse from [facility] called and states that patient is being discharge from [facility] as patient is now in a stable status and is able to do all the daily activities.  Nurse reports patient will continue with dialysis 3 times a week.  Nurse's name is Pearlean Sniff  Nurse states if have additional questions  call back number is [phone].

## 2024-05-11 MED ORDER — LEVOTHYROXINE SODIUM 100 MCG PO TABS: 100.0000 ug | ORAL_TABLET | Freq: Every day | ORAL | 11 refills | Status: AC

## 2024-05-17 ENCOUNTER — Encounter: Payer: Self-pay | Admitting: *Deleted

## 2024-05-31 ENCOUNTER — Telehealth: Payer: Self-pay

## 2024-05-31 ENCOUNTER — Ambulatory Visit: Payer: Self-pay | Admitting: Cardiology

## 2024-05-31 DIAGNOSIS — R55 Syncope and collapse: Secondary | ICD-10-CM | POA: Diagnosis not present

## 2024-05-31 DIAGNOSIS — I4891 Unspecified atrial fibrillation: Secondary | ICD-10-CM

## 2024-05-31 DIAGNOSIS — I455 Other specified heart block: Secondary | ICD-10-CM

## 2024-05-31 NOTE — Progress Notes (Unsigned)
 Electrophysiology Office Note:   Date:  06/01/2024  ID:  Ryan Wells, DOB 1956/02/28, MRN [id number]  Primary Cardiologist: Lonni LITTIE Nanas, MD Primary Heart Failure: None Electrophysiologist: Will Gladis Norton, MD      History of Present Illness:   Ryan Wells is a 68 y.o. male with h/o AF, AT, atypical AFL, sinus pause, HFpEF, HTN, HLD, bacterial endocarditis, C-Diff, chronic osteomyelitis of the lumbar spine, prior psoas abscess with MSSA bacteremia, ESRD on HD, DM II, hypothyroidism seen today for acute visit due to abnormal cardiac monitor.    Admitted from 9/13-9/14/25 for elevated HR while in HD. Labs notable for hyperkalemia (6.8 K+)  Initial EKG's concerning for AT.  He had multiple rhythm's while inpatient > SV, junctional bradycardia, idioventricular rhythm, postconversion pauses after AF/AFL.  These were thought in setting of electrolyte disturbances. He was started on amiodarone  by Cardiology >converted to SR and transitioned to PO. He has known chronic lumbar osteomyelitis and was planned for 6 week course of Ancef  to be given with HD.   The patient wore a cardiac monitor which showed HR of 38-193 bpm with ave 89 bpm.  Predominant underlying rhythm was AFL (76%). There were 3 pauses with the longest lasting 6.5 seconds (post conversion). Junctional rhythm was detected +/- 45 seconds of symptomatic events.    Patient reports, via system provided translator, he is unaware of any particular heart rhythm.  He denies episodes of lightheadedness and dizziness, no episodes of syncope.  He reports after hospitalization he was unable to fill his Lokelma  prescription due to cost.  They asked if it is necessary.  He reports his biggest concern is his lower extremities with his ongoing wounds.  He states his lower extremities are so heavy at times and he has a difficult time getting into the car.  Patient does not remember hitting a symptom activator on his cardiac monitor  reports he actually received a phone call and was told to hit the button on his monitor.  He denies chest pain, palpitations, dyspnea, PND, orthopnea, nausea, vomiting, dizziness, syncope, or early satiety.   Review of systems complete and found to be negative unless listed in HPI.   EP Information / Studies Reviewed:    EKG is ordered today. Personal review as below.  EKG Interpretation Date/Time:  Tuesday June 01 2024 08:28:11 EDT Ventricular Rate:  68 PR Interval:  240 QRS Duration:  118 QT Interval:  488 QTC Calculation: 518 R Axis:   94  Text Interpretation: Sinus rhythm with 1st degree A-V block Rightward axis Non-specific intra-ventricular conduction delay Confirmed by Aniceto Jarvis ([id number]) on 06/01/2024 8:31:16 AM    Arrhythmia / AAD / Pertinent EP Studies AF AT   Cardiac Monitor 05/04/24 > HR of 38-193 bpm with ave 89 bpm.  Predominant underlying rhythm was AFL (76%). There were 3 pauses with the longest lasting 6.5 seconds (6:20 pm, 3:20 am 5.7 sec pause, 3.2 sec pause - all post conversion)  Junctional rhythm was detected +/- 45 seconds of symptomatic events.     Risk Assessment/Calculations:    CHA2DS2-VASc Score = 5   This indicates a 7.2% annual risk of stroke. The patient's score is based upon: CHF History: 1 (? EF 35-40 by echo 02/2024, possibly affected by tachycardia) HTN History: 1 Diabetes History: 1 Stroke History: 0 Vascular Disease History: 1 (aortic atherosclerosis) Age Score: 1 Gender Score: 0          Physical Exam:   VS:  BP ROLLEN)  168/72 (BP Location: Left Arm, Patient Position: Sitting, Cuff Size: Normal)   Pulse 68   Ht 5' 2 (1.575 m)   Wt 161 lb 9.6 oz (73.3 kg)   SpO2 94%   BMI 29.56 kg/m    Wt Readings from Last 3 Encounters:  06/01/24 161 lb 9.6 oz (73.3 kg)  04/25/24 151 lb 10.8 oz (68.8 kg)  03/24/24 150 lb (68 kg)     GEN: Well nourished, well developed in no acute distress NECK: No JVD; No carotid bruits CARDIAC:  Regular rate and rhythm, no murmurs, rubs, gallops RESPIRATORY:  Clear to auscultation without rales, wheezing or rhonchi  ABDOMEN: Soft, non-tender, non-distended EXTREMITIES:  No edema; No deformity   ASSESSMENT AND PLAN:    Atrial Flutter  Paroxysmal Atrial Fibrillation  Tachy-Brady Syndrome with Post Conversion Pauses  Bradycardia  CHA2DS2-VASc 5 -reduce amiodarone  to 100 mg daily  -Given multiple arrhythmias and high risk for anesthesia would defer ablation at this time and pursue rhythm control with amiodarone  -monitor review shows prolonged pauses > at least one that is >6 sec post conversion pause in the evening, one pause is around 3 AM lasting 5.7 sec (during sleep) and the other is 3.2 seconds  -plan for rhythm control, if persistent pauses would be high risk for transvenous device given chronic osteo and ESRD > would need to consider leadless device (but raises concerns for anesthesia time as well) -consider repeat heart monitor in one month on reduced dose amio, f/u amio labs -plan of care reviewed with Dr. Inocencio   Secondary Hypercoagulable State  -continue Eliquis  5mg  BID, dose reviewed and appropriate by age / wt   Chronic Osteo  -per ID, on Ancef  IV with HD   ESRD  -per Nephrology    Follow up with EP APP in 4 weeks. Attempted to call patient and son to review plan of care.  Will arrange follow up for 4 weeks.   Signed, Daphne Barrack, NP-C, AGACNP-BC [facility] - Electrophysiology  06/01/2024, 12:52 PM

## 2024-05-31 NOTE — Telephone Encounter (Signed)
 Spoke to patient's son Franky.Dr.Schumann advised your father's monitor was abnormal.Appointment scheduled with Leotis Barrack NP 10/21 at 8:00 am.

## 2024-06-01 ENCOUNTER — Encounter: Admitting: Infectious Diseases

## 2024-06-01 ENCOUNTER — Ambulatory Visit: Attending: Pulmonary Disease | Admitting: Pulmonary Disease

## 2024-06-01 ENCOUNTER — Encounter: Payer: Self-pay | Admitting: Pulmonary Disease

## 2024-06-01 VITALS — BP 168/72 | HR 68 | Ht 62.0 in | Wt 161.6 lb

## 2024-06-01 DIAGNOSIS — I4892 Unspecified atrial flutter: Secondary | ICD-10-CM

## 2024-06-01 DIAGNOSIS — I480 Paroxysmal atrial fibrillation: Secondary | ICD-10-CM

## 2024-06-01 DIAGNOSIS — D6869 Other thrombophilia: Secondary | ICD-10-CM

## 2024-06-01 DIAGNOSIS — R001 Bradycardia, unspecified: Secondary | ICD-10-CM | POA: Diagnosis not present

## 2024-06-01 MED ORDER — AMIODARONE HCL 200 MG PO TABS: 100.0000 mg | ORAL_TABLET | Freq: Every day | ORAL | 1 refills | Status: DC

## 2024-06-01 NOTE — Patient Instructions (Addendum)
 Medication Instructions:  Reduce amiodarone  to 100 mg (1/2 tablet) daily   Please ask your Nephrologist (Kidney Doctor) tomorrow about your Lokelma  (med for potassium > if you need it, due to cost)   *If you need a refill on your cardiac medications before your next appointment, please call your pharmacy*  Lab Work: No lab work today If you have labs (blood work) drawn today and your tests are completely normal, you will receive your results only by: MyChart Message (if you have MyChart) OR A paper copy in the mail If you have any lab test that is abnormal or we need to change your treatment, we will call you to review the results.  Testing/Procedures: We will call you regarding procedure timing  Follow-Up: At [facility], you and your health needs are our priority.  As part of our continuing mission to provide you with exceptional heart care, our providers are all part of one team.  This team includes your primary Cardiologist (physician) and Advanced Practice Providers or APPs (Physician Assistants and Nurse Practitioners) who all work together to provide you with the care you need, when you need it.  Your next appointment:   We will be in touch regarding next steps.   Provider:   You may see Will Gladis Norton, MD or one of the following Advanced Practice Providers on your designated Care Team:    Daphne Barrack, NP    We recommend signing up for the patient portal called MyChart.  Sign up information is provided on this After Visit Summary.  MyChart is used to connect with patients for Virtual Visits (Telemedicine).  Patients are able to view lab/test results, encounter notes, upcoming appointments, etc.  Non-urgent messages can be sent to your provider as well.   To learn more about what you can do with MyChart, go to [email or web address].

## 2024-06-04 ENCOUNTER — Ambulatory Visit: Admitting: Cardiology

## 2024-06-07 NOTE — Progress Notes (Unsigned)
 Cardiology Office Note:    Date:  06/08/2024   ID:  Ryan Wells, DOB Apr 21, 1956, MRN [id number]  PCP:  Adella Norris, MD  Cardiologist:  Lonni LITTIE Nanas, MD  Electrophysiologist:  Soyla Gladis Norton, MD   Referring MD: Adella Norris, MD   No chief complaint on file.   History of Present Illness:    Ryan Wells is a 68 y.o. male with a hx of ESRD, hypertension, T2DM, MSSA bacteremia,who presents for follow-up.  He was referred by Dr. Macel for evaluation of chest pain, initially seen 08/2023.  He was admitted 01/2021 with MSSA bacteremia.  TEE was negative for vegetation.  TTE 01/16/2021 showed EF 55 to 60%, normal RV function, no significant valvular disease.  TEE 01/19/2021 showed EF 60 to 65%, normal RV function, no significant valvular disease.  He reports he has been having chest pain during dialysis.  Describes as left-sided chest pressure, can last his whole dialysis session.  Resolves after dialysis ends.  Stress PET was ordered at initial clinic visit but has not been done.  He reported syncopal episode.  Cardiac monitor x 14 days 03/2024 showed 76% atrial flutter burden with average rate 99 bpm, 3 pauses with longest lasting 6.5 seconds.  He was referred to EP, recommended reducing amiodarone  to 100 mg daily.  Echocardiogram 02/2024 showed EF 50%, normal RV function, mildly elevated pulm arterial systolic pressure, mild biatrial enlargement mild mitral regurgitation.  Since last clinic visit, he reports he is doing okay.  He denies any recent chest pain or dyspnea.  Denies any lightheadedness or syncope.  Does report has been having pain in his legs when he walks.  States that legs feel weak.  Also reports having lower extremity edema.  Past Medical History:  Diagnosis Date   3rd nerve palsy, partial, right 01/03/2017   AAA (abdominal aortic aneurysm)    Not noted on CT abd 2019   Chest pain 11/2013   Normal Echo/ EKG/enzymes-hospitalized.  Did  not get outpatient stress testing following hospitalization.  No chest pain since   Chronic kidney disease    on dialysis Tues, Thurs and Sat   Diabetes mellitus without complication (HCC)    Diabetes type 2, uncontrolled 11/25/2011   no meds, diet controlled per patient   Diabetic gastroparesis (HCC) 01/22/2017   Hypertension    no meds   Microalbuminuria 01/19/2017   Myocardial infarction [facility]) 2015    Past Surgical History:  Procedure Laterality Date   A/V FISTULAGRAM Left 06/04/2019   Procedure: A/V FISTULAGRAM;  Surgeon: Eliza Lonni RAMAN, MD;  Location: [facility] INVASIVE CV LAB;  Service: Cardiovascular;  Laterality: Left;   A/V FISTULAGRAM Left 10/15/2019   Procedure: A/V FISTULAGRAM;  Surgeon: Eliza Lonni RAMAN, MD;  Location: [facility] INVASIVE CV LAB;  Service: Cardiovascular;  Laterality: Left;   A/V FISTULAGRAM N/A 01/24/2021   Procedure: A/V FISTULAGRAM - Left Upper;  Surgeon: Magda Debby SAILOR, MD;  Location: [facility] INVASIVE CV LAB;  Service: Cardiovascular;  Laterality: N/A;   A/V SHUNT INTERVENTION Left 04/15/2024   Procedure: A/V SHUNT INTERVENTION;  Surgeon: Serene Gaile ORN, MD;  Location: [facility] PV LAB;  Service: Cardiovascular;  Laterality: Left;   AV FISTULA PLACEMENT Left 07/02/2018   Procedure: BRACHIOCEPHALIC ARTERIOVENOUS (AV) FISTULA CREATION LEFT ARM;  Surgeon: Serene Gaile ORN, MD;  Location: [facility] OR;  Service: Vascular;  Laterality: Left;   BASCILIC VEIN TRANSPOSITION Left 07/26/2019   Procedure: Bascilic Vein Transposition;  Surgeon: Eliza Lonni RAMAN, MD;  Location: [facility]  OR;  Service: Vascular;  Laterality: Left;   COLONOSCOPY     EMBOLIZATION (CATH LAB) Left 06/04/2019   Procedure: EMBOLIZATION;  Surgeon: Eliza Lonni RAMAN, MD;  Location: [facility] INVASIVE CV LAB;  Service: Cardiovascular;  Laterality: Left;  LT ARM FISTULA/COMPETING BRANCH   EYE SURGERY Bilateral    cataracts x2   EYE SURGERY     INSERTION OF DIALYSIS CATHETER Right 06/08/2019   Procedure: INSERTION  OF PALINDROME DIALYSIS CATHETER IN RIGHT INTERNAL JUGULAR;  Surgeon: Eliza Lonni RAMAN, MD;  Location: [facility] OR;  Service: Vascular;  Laterality: Right;   LIGATION OF ARTERIOVENOUS  FISTULA Left 07/26/2019   Procedure: Ligation Of BrachioCephalic  Fistula;  Surgeon: Eliza Lonni RAMAN, MD;  Location: [facility] OR;  Service: Vascular;  Laterality: Left;   NECK SURGERY     PERIPHERAL VASCULAR BALLOON ANGIOPLASTY Left 06/04/2019   Procedure: PERIPHERAL VASCULAR BALLOON ANGIOPLASTY;  Surgeon: Eliza Lonni RAMAN, MD;  Location: [facility] INVASIVE CV LAB;  Service: Cardiovascular;  Laterality: Left;  ARM FISTULA   PERIPHERAL VASCULAR BALLOON ANGIOPLASTY Left 10/15/2019   Procedure: PERIPHERAL VASCULAR BALLOON ANGIOPLASTY;  Surgeon: Eliza Lonni RAMAN, MD;  Location: [facility] INVASIVE CV LAB;  Service: Cardiovascular;  Laterality: Left;  arm fistula   TEE WITHOUT CARDIOVERSION N/A 01/19/2021   Procedure: TRANSESOPHAGEAL ECHOCARDIOGRAM (TEE);  Surgeon: Jeffrie Oneil BROCKS, MD;  Location: [facility] ENDOSCOPY;  Service: Cardiovascular;  Laterality: N/A;   VENOUS ANGIOPLASTY  04/15/2024   Procedure: VENOUS ANGIOPLASTY;  Surgeon: Serene Gaile ORN, MD;  Location: [facility] PV LAB;  Service: Cardiovascular;;  Subclavian Vein    Current Medications: No outpatient medications have been marked as taking for the 06/08/24 encounter (Office Visit) with Kate Lonni CROME, MD.     Allergies:   Patient has no known allergies.   Social History   Socioeconomic History   Marital status: Married    Spouse name: Ezella Flint Dodson Valdemar   Number of children: 3   Years of education: 1 year university   Highest education level: Not on file  Occupational History   Occupation: unemployed  Tobacco Use   Smoking status: Former   Smokeless tobacco: Never  Advertising Account Planner   Vaping status: Never Used  Substance and Sexual Activity   Alcohol use: Not Currently   Drug use: Never   Sexual activity: Yes  Other Topics Concern   Not on file   Social History Narrative   Lives in [place] neighborhood with wife     2 sons live locally      Social Drivers of Health   Financial Resource Strain: Medium Risk (09/23/2023)   Overall Financial Resource Strain (CARDIA)    Difficulty of Paying Living Expenses: Somewhat hard  Food Insecurity: No Food Insecurity (04/24/2024)   Hunger Vital Sign    Worried About Running Out of Food in the Last Year: Never true    Ran Out of Food in the Last Year: Never true  Transportation Needs: No Transportation Needs (04/24/2024)   PRAPARE - Administrator, Civil Service (Medical): No    Lack of Transportation (Non-Medical): No  Recent Concern: Transportation Needs - Unmet Transportation Needs (02/20/2024)   PRAPARE - Administrator, Civil Service (Medical): Yes    Lack of Transportation (Non-Medical): Yes  Physical Activity: Not on file  Stress: Not on file  Social Connections: Unknown (04/24/2024)   Social Connection and Isolation Panel    Frequency of Communication with Friends and Family: More than three times a week  Frequency of Social Gatherings with Friends and Family: Patient declined    Attends Religious Services: Patient declined    Active Member of Clubs or Organizations: Patient declined    Attends Banker Meetings: Patient declined    Marital Status: Patient declined  Recent Concern: Social Connections - Socially Isolated (02/20/2024)   Social Connection and Isolation Panel    Frequency of Communication with Friends and Family: Never    Frequency of Social Gatherings with Friends and Family: Never    Attends Religious Services: Never    Database Administrator or Organizations: No    Attends Engineer, Structural: Never    Marital Status: Never married     Family History: The patient's family history includes Diabetes in his brother, father, and sister; Heart disease in his mother; Kidney disease in his father. There is no history  of Colon cancer, Stomach cancer, or Esophageal cancer.  ROS:   Please see the history of present illness.     All other systems reviewed and are negative.  EKGs/Labs/Other Studies Reviewed:    The following studies were reviewed today:   EKG:   08/26/2023: Normal sinus rhythm, rate 67, QTc 486, LVH  Recent Labs: 03/08/2024: B Natriuretic Peptide 1,227.1 03/10/2024: ALT 19 04/24/2024: Magnesium  3.0 04/25/2024: BUN 26; Creatinine, Ser 5.51; Hemoglobin 11.0; Platelets 92; Potassium 4.6; Sodium 136 04/30/2024: TSH 29.300  Recent Lipid Panel    Component Value Date/Time   CHOL 126 09/30/2023 0921   TRIG 62 09/30/2023 0921   HDL 64 09/30/2023 0921   CHOLHDL 4.2 11/27/2013 1709   VLDL 25 11/27/2013 1709   LDLCALC 49 09/30/2023 0921    Physical Exam:    VS:  There were no vitals taken for this visit.    Wt Readings from Last 3 Encounters:  06/01/24 161 lb 9.6 oz (73.3 kg)  04/25/24 151 lb 10.8 oz (68.8 kg)  03/24/24 150 lb (68 kg)     GEN:  Well nourished, well developed in no acute distress HEENT: Normal NECK: No JVD; No carotid bruits LYMPHATICS: No lymphadenopathy CARDIAC: RRR, no murmurs, rubs, gallops RESPIRATORY:  Clear to auscultation without rales, wheezing or rhonchi  ABDOMEN: Soft, non-tender, non-distended MUSCULOSKELETAL:  No edema; No deformity  SKIN: Warm and dry NEUROLOGIC:  Alert and oriented x 3 PSYCHIATRIC:  Normal affect   ASSESSMENT:    1. Chest pain of uncertain etiology   2. Sinus pause   3. Atrial flutter, unspecified type (HCC)   4. Hyperlipidemia, unspecified hyperlipidemia type   5. Ulcer of lower extremity, unspecified laterality, unspecified ulcer stage (HCC)   6. Pain in both lower extremities     PLAN:    Chest pain: Reports left-sided chest pressure during dialysis.  Multiple CAD risk factors (age, T2DM, hypertension, hyperlipidemia).  Recommended stress PET to evaluate for ischemia but he did not schedule.  Likely not a good candidate  for regadenoson  now that monitor showing long pauses.  He is denying any recent chest pain or dyspnea.  Will hold off on stress testing at this time.  Pauses: He reported syncopal episode.  Cardiac monitor x 14 days 03/2024 showed 76% atrial flutter burden with average rate 99 bpm, 3 pauses with longest lasting 6.5 seconds.  He was referred to EP, recommended reducing amiodarone  to 100 mg daily and considering leadless pacemaker given his infection risk  Atrial flutter: Cardiac monitor x 14 days 03/2024 showed 76% atrial flutter burden with average rate 99 bpm, 3 pauses  with longest lasting 6.5 seconds.  Continue Eliquis , amiodarone   Hyperlipidemia: On atorvastatin  20 mg daily.  LDL 49 on 09/30/2023.  He is reporting myalgias, will hold statin x 2 weeks to see if improves  Leg pain/weakness: Check ABIs.  Trial of holding statin as above  Ulcers in legs: Refer to wound clinic  T2DM: Has history of diabetes but significant improvement, most recent A1c 4.8% on 04/2024  ESRD: On HD  RTC in 4 months  Informed Consent   Shared Decision Making/Informed Consent The risks [chest pain, shortness of breath, cardiac arrhythmias, dizziness, blood pressure fluctuations, myocardial infarction, stroke/transient ischemic attack, nausea, vomiting, allergic reaction, radiation exposure, metallic taste sensation and life-threatening complications (estimated to be 1 in 10,000)], benefits (risk stratification, diagnosing coronary artery disease, treatment guidance) and alternatives of a cardiac PET stress test were discussed in detail with Ryan Wells and he agrees to proceed.        Medication Adjustments/Labs and Tests Ordered: Current medicines are reviewed at length with the patient today.  Concerns regarding medicines are outlined above.  Orders Placed This Encounter  Procedures   AMB referral to wound care center   VAS US  ABI WITH/WO TBI   No orders of the defined types were placed in this  encounter.   Patient Instructions  Medication Instructions:  Stop taking Atorvastatin  for 2 weeks to see if this helps with your leg pain.  Call us  in 2 weeks with an update. *If you need a refill on your cardiac medications before your next appointment, please call your pharmacy*  Lab Work: none If you have labs (blood work) drawn today and your tests are completely normal, you will receive your results only by: MyChart Message (if you have MyChart) OR A paper copy in the mail If you have any lab test that is abnormal or we need to change your treatment, we will call you to review the results.  Testing/Procedures: Your physician has requested that you have an ankle brachial index (ABI). During this test an ultrasound and blood pressure cuff are used to evaluate the arteries that supply the arms and legs with blood. Allow thirty minutes for this exam. There are no restrictions or special instructions.  Please note: We ask at that you not bring children with you during ultrasound (echo/ vascular) testing. Due to room size and safety concerns, children are not allowed in the ultrasound rooms during exams. Our front office staff cannot provide observation of children in our lobby area while testing is being conducted. An adult accompanying a patient to their appointment will only be allowed in the ultrasound room at the discretion of the ultrasound technician under special circumstances. We apologize for any inconvenience.   Follow-Up: At [facility], you and your health needs are our priority.  As part of our continuing mission to provide you with exceptional heart care, our providers are all part of one team.  This team includes your primary Cardiologist (physician) and Advanced Practice Providers or APPs (Physician Assistants and Nurse Practitioners) who all work together to provide you with the care you need, when you need it.  Your next appointment:   4 month(s)  Provider:    Lonni LITTIE Nanas, MD    We recommend signing up for the patient portal called MyChart.  Sign up information is provided on this After Visit Summary.  MyChart is used to connect with patients for Virtual Visits (Telemedicine).  Patients are able to view lab/test results, encounter notes, upcoming  appointments, etc.  Non-urgent messages can be sent to your provider as well.   To learn more about what you can do with MyChart, go to [email or web address].   Other Instructions Please schedule follow up appointment for 4 weeks from now with Dr Inocencio or EP APP  You have been referred to wound care clinic           Signed, Lonni LITTIE Nanas, MD  06/08/2024 5:02 PM    [facility]

## 2024-06-08 ENCOUNTER — Telehealth: Payer: Self-pay | Admitting: Pulmonary Disease

## 2024-06-08 ENCOUNTER — Ambulatory Visit: Attending: Cardiology | Admitting: Cardiology

## 2024-06-08 ENCOUNTER — Encounter: Admitting: Infectious Diseases

## 2024-06-08 DIAGNOSIS — I455 Other specified heart block: Secondary | ICD-10-CM

## 2024-06-08 DIAGNOSIS — E785 Hyperlipidemia, unspecified: Secondary | ICD-10-CM

## 2024-06-08 DIAGNOSIS — M79605 Pain in left leg: Secondary | ICD-10-CM

## 2024-06-08 DIAGNOSIS — M79604 Pain in right leg: Secondary | ICD-10-CM

## 2024-06-08 DIAGNOSIS — I4892 Unspecified atrial flutter: Secondary | ICD-10-CM | POA: Diagnosis not present

## 2024-06-08 DIAGNOSIS — R079 Chest pain, unspecified: Secondary | ICD-10-CM | POA: Diagnosis not present

## 2024-06-08 DIAGNOSIS — L97909 Non-pressure chronic ulcer of unspecified part of unspecified lower leg with unspecified severity: Secondary | ICD-10-CM

## 2024-06-08 NOTE — Patient Instructions (Signed)
 Medication Instructions:  Stop taking Atorvastatin  for 2 weeks to see if this helps with your leg pain.  Call us  in 2 weeks with an update. *If you need a refill on your cardiac medications before your next appointment, please call your pharmacy*  Lab Work: none If you have labs (blood work) drawn today and your tests are completely normal, you will receive your results only by: MyChart Message (if you have MyChart) OR A paper copy in the mail If you have any lab test that is abnormal or we need to change your treatment, we will call you to review the results.  Testing/Procedures: Your physician has requested that you have an ankle brachial index (ABI). During this test an ultrasound and blood pressure cuff are used to evaluate the arteries that supply the arms and legs with blood. Allow thirty minutes for this exam. There are no restrictions or special instructions.  Please note: We ask at that you not bring children with you during ultrasound (echo/ vascular) testing. Due to room size and safety concerns, children are not allowed in the ultrasound rooms during exams. Our front office staff cannot provide observation of children in our lobby area while testing is being conducted. An adult accompanying a patient to their appointment will only be allowed in the ultrasound room at the discretion of the ultrasound technician under special circumstances. We apologize for any inconvenience.   Follow-Up: At [facility], you and your health needs are our priority.  As part of our continuing mission to provide you with exceptional heart care, our providers are all part of one team.  This team includes your primary Cardiologist (physician) and Advanced Practice Providers or APPs (Physician Assistants and Nurse Practitioners) who all work together to provide you with the care you need, when you need it.  Your next appointment:   4 month(s)  Provider:   Lonni LITTIE Nanas, MD    We  recommend signing up for the patient portal called MyChart.  Sign up information is provided on this After Visit Summary.  MyChart is used to connect with patients for Virtual Visits (Telemedicine).  Patients are able to view lab/test results, encounter notes, upcoming appointments, etc.  Non-urgent messages can be sent to your provider as well.   To learn more about what you can do with MyChart, go to [email or web address].   Other Instructions Please schedule follow up appointment for 4 weeks from now with Dr Inocencio or EP APP  You have been referred to wound care clinic

## 2024-06-08 NOTE — Telephone Encounter (Signed)
 Patients son, Franky, called for update.  Mr. Hunzeker gave me permission at our last visit to call his son's for communication as he needs a nurse, learning disability.    Plan of care reviewed with Dr. Inocencio.  Patients son, Franky, updated on plan of care via phone to continue medications for rhythm control for now.  Mr. Rawles is high risk for any procedure and we will follow heart rhythm while on amiodarone .  Hopeful to maintain normal rhythm and avoid pauses.      Will have patient follow up in one month for amiodarone  surveillance monitoring.    Daphne Barrack, NP-C, AGACNP-BC [facility] - Electrophysiology  06/08/2024, 4:33 PM

## 2024-06-24 ENCOUNTER — Ambulatory Visit (HOSPITAL_COMMUNITY)
Admission: RE | Admit: 2024-06-24 | Discharge: 2024-06-24 | Disposition: A | Discharge status: Home / Self Care | Source: Ambulatory Visit | Attending: Cardiology | Admitting: Cardiology

## 2024-06-24 ENCOUNTER — Ambulatory Visit: Payer: Self-pay | Admitting: Cardiology

## 2024-06-24 DIAGNOSIS — L97909 Non-pressure chronic ulcer of unspecified part of unspecified lower leg with unspecified severity: Secondary | ICD-10-CM | POA: Insufficient documentation

## 2024-06-24 DIAGNOSIS — M79604 Pain in right leg: Secondary | ICD-10-CM | POA: Diagnosis not present

## 2024-06-24 DIAGNOSIS — M79605 Pain in left leg: Secondary | ICD-10-CM | POA: Insufficient documentation

## 2024-06-24 LAB — VAS US ABI WITH/WO TBI

## 2024-07-14 NOTE — Progress Notes (Unsigned)
 Electrophysiology Office Note:   Date:  07/15/2024  ID:  Ryan Wells, DOB 24-Nov-1955, MRN [id number]  Primary Cardiologist: Lonni LITTIE Nanas, MD Primary Heart Failure: None Electrophysiologist: Will Gladis Norton, MD      History of Present Illness:   Ryan Wells is a 68 y.o. male with h/o  AF, AT, atypical AFL, sinus pause, HFpEF, HTN, HLD, bacterial endocarditis, C-Diff, chronic osteomyelitis of the lumbar spine, prior psoas abscess with MSSA bacteremia, ESRD on HD, DM II, hypothyroidism seen today for routine electrophysiology followup.   Admitted from 9/13-9/14/25 for elevated HR while in HD. Labs notable for hyperkalemia (6.8 K+)  Initial EKG's concerning for AT.  He had multiple rhythm's while inpatient > SV, junctional bradycardia, idioventricular rhythm, postconversion pauses after AF/AFL.  These were thought in setting of electrolyte disturbances. He was started on amiodarone  by Cardiology >converted to SR and transitioned to PO. He has known chronic lumbar osteomyelitis and was planned for 6 week course of Ancef  to be given with HD.    The patient wore a cardiac monitor 05/04/24 which showed HR of 38-193 bpm with ave 89 bpm.  Predominant underlying rhythm was AFL (76%). There were 3 pauses with the longest lasting 6.5 seconds (post conversion). Junctional rhythm was detected +/- 45 seconds of symptomatic events.     Clinic provided, in person Spanish translator utilized for interview.  Since last being seen in our clinic the patient reports he stopped taking all his medications as he was told to not take them for 2 weeks.  By note review, he was instructed to hold his statin for 2 weeks due to myalgias. He states he feels well. He is convinced that they are putting something in his HD that makes him lightheaded / dizzy. His wife and son Bernerd) accompany him. His family states he gets stuck on an idea and is difficult to convince otherwise.   He denies chest pain,  palpitations, dyspnea, PND, orthopnea, nausea, vomiting, dizziness, syncope, edema, weight gain, or early satiety.   Review of systems complete and found to be negative unless listed in HPI.   EP Information / Studies Reviewed:    EKG is ordered today. Personal review as below.  EKG Interpretation Date/Time:  Thursday July 15 2024 09:12:25 EST Ventricular Rate:  75 PR Interval:  168 QRS Duration:  98 QT Interval:  478 QTC Calculation: 533 R Axis:   106  Text Interpretation: Sinus rhythm with marked sinus arrhythmia Rightward axis Confirmed by Aniceto Jarvis ([id number]) on 07/15/2024 9:20:17 AM   Arrhythmia / AAD / Pertinent EP Studies AF AT   Cardiac Monitor 05/04/24 > HR of 38-193 bpm with ave 89 bpm.  Predominant underlying rhythm was AFL (76%). There were 3 pauses with the longest lasting 6.5 seconds (6:20 pm, 3:20 am 5.7 sec pause, 3.2 sec pause - all post conversion)  Junctional rhythm was detected +/- 45 seconds of symptomatic events.   Amiodarone  03/31/24 >   Risk Assessment/Calculations:    CHA2DS2-VASc Score = 5   This indicates a 7.2% annual risk of stroke. The patient's score is based upon: CHF History: 1 (? EF 35-40 by echo 02/2024, possibly affected by tachycardia) HTN History: 1 Diabetes History: 1 Stroke History: 0 Vascular Disease History: 1 (aortic atherosclerosis) Age Score: 1 Gender Score: 0             Physical Exam:   VS:  BP 124/62   Pulse 75   Ht 5' 2 (1.575 m)  Wt 158 lb (71.7 kg)   BMI 28.90 kg/m    Wt Readings from Last 3 Encounters:  07/15/24 158 lb (71.7 kg)  06/01/24 161 lb 9.6 oz (73.3 kg)  04/25/24 151 lb 10.8 oz (68.8 kg)     GEN: Well nourished, well developed in no acute distress NECK: No JVD; No carotid bruits CARDIAC: Regular rate and rhythm, no murmurs, rubs, gallops RESPIRATORY:  Clear to auscultation without rales, wheezing or rhonchi  ABDOMEN: Soft, non-tender, non-distended EXTREMITIES:  No edema; No deformity. LUE AVF    ASSESSMENT AND PLAN:    Atrial Flutter  Paroxysmal Atrial Fibrillation  Tachy-Brady Syndrome with Post Conversion Pauses  Bradycardia  CHA2DS2-VASc 5 -extensive discussion with translator, instructed to resume amiodarone  100 mg daily (and eliquis ) -update amio labs at next visit given pt not taking > LFT's, TSH / Free T4 -asymptomatic   -given multiple arrhythmias, high risk for anesthesia would defer ablation and continue rhythm control efforts with amiodarone   -if persistent pauses or symptomatic, would need to consider leadless device given infection risks (chronic osteo, ESRD) but he is high risk for anesthesia that would be required for leadless implant / might not be a candidate for device   Secondary Hypercoagulable State  -as above, instructed to resume Eliquis  5mg  BID, dose reviewed and appropriate by age / wt  Chronic Osteo  -per ID   ESRD  -per Nephrology    Follow up with EP APP in 3 months  Signed, Daphne Barrack, NP-C, AGACNP-BC [facility] - Electrophysiology  07/15/2024, 9:20 AM

## 2024-07-15 ENCOUNTER — Encounter: Payer: Self-pay | Admitting: Pulmonary Disease

## 2024-07-15 ENCOUNTER — Other Ambulatory Visit (HOSPITAL_COMMUNITY): Payer: Self-pay

## 2024-07-15 ENCOUNTER — Ambulatory Visit: Attending: Pulmonary Disease | Admitting: Pulmonary Disease

## 2024-07-15 VITALS — BP 124/62 | HR 75 | Ht 62.0 in | Wt 158.0 lb

## 2024-07-15 DIAGNOSIS — I480 Paroxysmal atrial fibrillation: Secondary | ICD-10-CM

## 2024-07-15 DIAGNOSIS — I455 Other specified heart block: Secondary | ICD-10-CM | POA: Diagnosis not present

## 2024-07-15 DIAGNOSIS — R001 Bradycardia, unspecified: Secondary | ICD-10-CM

## 2024-07-15 DIAGNOSIS — I4892 Unspecified atrial flutter: Secondary | ICD-10-CM

## 2024-07-15 DIAGNOSIS — D6869 Other thrombophilia: Secondary | ICD-10-CM

## 2024-07-15 MED ORDER — APIXABAN 5 MG PO TABS: 5.0000 mg | ORAL_TABLET | Freq: Two times a day (BID) | ORAL | 3 refills | Status: AC

## 2024-07-15 MED ORDER — AMIODARONE HCL 200 MG PO TABS: 100.0000 mg | ORAL_TABLET | Freq: Every day | ORAL | 3 refills | Status: AC

## 2024-07-15 NOTE — Patient Instructions (Addendum)
 Medication Instructions:   Resume your amiodarone  100 mg daily (1/2 tablet daily) and your Eliquis  5mg  BID   Call me if you have dizziness.   *If you need a refill on your cardiac medications before your next appointment, please call your pharmacy*   Lab Work:  We will check amiodarone  lab work at the next visit.    If you have labs (blood work) drawn today and your tests are completely normal, you will receive your results only by: MyChart Message (if you have MyChart) OR A paper copy in the mail If you have any lab test that is abnormal or we need to change your treatment, we will call you to review the results.    Testing/Procedures:     Follow-Up: At [facility], you and your health needs are our priority.  As part of our continuing mission to provide you with exceptional heart care, our providers are all part of one team.  This team includes your primary Cardiologist (physician) and Advanced Practice Providers or APPs (Physician Assistants and Nurse Practitioners) who all work together to provide you with the care you need, when you need it.  Your next appointment:    3 month(s)  bProvider:  Daphne Barrack, NP    We recommend signing up for the patient portal called MyChart.  Sign up information is provided on this After Visit Summary.  MyChart is used to connect with patients for Virtual Visits (Telemedicine).  Patients are able to view lab/test results, encounter notes, upcoming appointments, etc.  Non-urgent messages can be sent to your provider as well.   To learn more about what you can do with MyChart, go to [email or web address].   Other Instructions

## 2024-07-16 ENCOUNTER — Other Ambulatory Visit

## 2024-07-22 ENCOUNTER — Other Ambulatory Visit

## 2024-07-22 DIAGNOSIS — E039 Hypothyroidism, unspecified: Secondary | ICD-10-CM

## 2024-07-23 LAB — T4, FREE: Free T4: 0.52 ng/dL — ABNORMAL LOW (ref 0.82–1.77)

## 2024-07-23 LAB — T3, FREE: T3, Free: 1.2 pg/mL — ABNORMAL LOW (ref 2.0–4.4)

## 2024-07-23 LAB — TSH: TSH: 90.5 u[IU]/mL — ABNORMAL HIGH (ref 0.450–4.500)

## 2024-09-14 ENCOUNTER — Ambulatory Visit: Payer: Medicare (Managed Care) | Admitting: Cardiology

## 2024-09-14 ENCOUNTER — Encounter: Payer: Self-pay | Admitting: Cardiology

## 2024-09-14 VITALS — BP 96/64 | HR 55 | Ht 62.0 in | Wt 152.9 lb

## 2024-09-14 DIAGNOSIS — I455 Other specified heart block: Secondary | ICD-10-CM

## 2024-09-14 DIAGNOSIS — I10 Essential (primary) hypertension: Secondary | ICD-10-CM

## 2024-09-14 DIAGNOSIS — I4892 Unspecified atrial flutter: Secondary | ICD-10-CM | POA: Diagnosis not present

## 2024-09-14 DIAGNOSIS — I480 Paroxysmal atrial fibrillation: Secondary | ICD-10-CM | POA: Diagnosis not present

## 2024-09-14 DIAGNOSIS — E785 Hyperlipidemia, unspecified: Secondary | ICD-10-CM

## 2024-09-23 ENCOUNTER — Other Ambulatory Visit: Payer: Medicare HMO

## 2024-09-28 ENCOUNTER — Encounter: Payer: Medicare HMO | Admitting: Internal Medicine

## 2024-10-15 ENCOUNTER — Ambulatory Visit: Admitting: Pulmonary Disease

## 2025-01-11 ENCOUNTER — Ambulatory Visit: Payer: Medicare (Managed Care) | Admitting: Cardiology
